# Patient Record
Sex: Male | Born: 1955 | Race: Black or African American | Hispanic: No | Marital: Married | Smoking: Former smoker
Health system: Southern US, Community
[De-identification: ages and names within clinical notes are randomized; demographics above are authoritative.]

## PROBLEM LIST (undated history)

## (undated) DIAGNOSIS — I340 Nonrheumatic mitral (valve) insufficiency: Secondary | ICD-10-CM

## (undated) DIAGNOSIS — I714 Abdominal aortic aneurysm, without rupture, unspecified: Secondary | ICD-10-CM

## (undated) DIAGNOSIS — I8393 Asymptomatic varicose veins of bilateral lower extremities: Secondary | ICD-10-CM

## (undated) DIAGNOSIS — N289 Disorder of kidney and ureter, unspecified: Secondary | ICD-10-CM

## (undated) DIAGNOSIS — Z5111 Encounter for antineoplastic chemotherapy: Secondary | ICD-10-CM

## (undated) DIAGNOSIS — C3432 Malignant neoplasm of lower lobe, left bronchus or lung: Secondary | ICD-10-CM

## (undated) DIAGNOSIS — I10 Essential (primary) hypertension: Secondary | ICD-10-CM

## (undated) DIAGNOSIS — C801 Malignant (primary) neoplasm, unspecified: Secondary | ICD-10-CM

## (undated) DIAGNOSIS — Z8619 Personal history of other infectious and parasitic diseases: Secondary | ICD-10-CM

## (undated) DIAGNOSIS — R918 Other nonspecific abnormal finding of lung field: Secondary | ICD-10-CM

## (undated) DIAGNOSIS — Z902 Acquired absence of lung [part of]: Secondary | ICD-10-CM

## (undated) DIAGNOSIS — M5126 Other intervertebral disc displacement, lumbar region: Secondary | ICD-10-CM

## (undated) DIAGNOSIS — C3490 Malignant neoplasm of unspecified part of unspecified bronchus or lung: Secondary | ICD-10-CM

## (undated) HISTORY — DX: Malignant neoplasm of unspecified part of unspecified bronchus or lung: C34.90

## (undated) HISTORY — DX: Malignant neoplasm of lower lobe, left bronchus or lung: C34.32

## (undated) HISTORY — PX: INGUINAL HERNIA REPAIR: SUR1180

## (undated) HISTORY — DX: Abdominal aortic aneurysm, without rupture, unspecified: I71.40

## (undated) HISTORY — DX: Other intervertebral disc displacement, lumbar region: M51.26

## (undated) HISTORY — DX: Abdominal aortic aneurysm, without rupture: I71.4

## (undated) HISTORY — DX: Disorder of kidney and ureter, unspecified: N28.9

## (undated) HISTORY — DX: Nonrheumatic mitral (valve) insufficiency: I34.0

## (undated) HISTORY — DX: Acquired absence of lung (part of): Z90.2

## (undated) HISTORY — DX: Encounter for antineoplastic chemotherapy: Z51.11

## (undated) HISTORY — PX: COLONOSCOPY: SHX174

---

## 1973-04-23 HISTORY — PX: INGUINAL HERNIA REPAIR: SUR1180

## 1999-12-15 ENCOUNTER — Encounter: Admission: RE | Admit: 1999-12-15 | Discharge: 1999-12-15 | Payer: Self-pay | Admitting: Cardiology

## 1999-12-15 ENCOUNTER — Encounter: Payer: Self-pay | Admitting: Cardiology

## 2000-06-10 ENCOUNTER — Encounter: Payer: Self-pay | Admitting: General Surgery

## 2000-06-10 ENCOUNTER — Encounter (INDEPENDENT_AMBULATORY_CARE_PROVIDER_SITE_OTHER): Payer: Self-pay

## 2000-06-10 ENCOUNTER — Inpatient Hospital Stay (HOSPITAL_COMMUNITY): Admission: EM | Admit: 2000-06-10 | Discharge: 2000-06-12 | Payer: Self-pay | Admitting: Emergency Medicine

## 2004-05-29 ENCOUNTER — Encounter: Admission: RE | Admit: 2004-05-29 | Discharge: 2004-05-29 | Payer: Self-pay | Admitting: Cardiology

## 2006-12-06 ENCOUNTER — Encounter: Admission: RE | Admit: 2006-12-06 | Discharge: 2006-12-06 | Payer: Self-pay | Admitting: Cardiology

## 2006-12-17 ENCOUNTER — Ambulatory Visit (HOSPITAL_COMMUNITY): Admission: RE | Admit: 2006-12-17 | Discharge: 2006-12-17 | Payer: Self-pay | Admitting: Cardiology

## 2010-09-08 NOTE — Op Note (Signed)
[facility]  Patient:    [name], [name]                        MRN: [id number] Proc. Date: 06/11/00 Adm. Date:  [id number] Attending:  Brandy Hale CC:         Osvaldo Shipper. Spruill, M.D.  Davis L. Cloward, M.D., [facility]   Operative Report  PREOPERATIVE DIAGNOSIS:  Incarcerated right inguinal hernia.  POSTOPERATIVE DIAGNOSIS:  Incarcerated right femoral hernia.  OPERATIVE PROCEDURE:  Repair of incarcerated right femoral hernia with mesh plug and patch.  SURGEON:  Angelia Mould. Derrell Lolling, M.D.  INDICATIONS:  This is a 55 year old white man with the onset of abdominal pain and repeated episodes of vomiting at 10:00 a.m. this morning. He has a painful lump in his right groin.  On examination, he has a nondistended, but mildly diffusely tender abdomen and a tender 3-4 cm mass in his right inguinal area. This is not reducible. X-rays show a couple of dilated loops of small bowel with air fluid levels, but no free air. His white blood cell count was elevated to 14,000. He was brought to the operating room emergently for exploration of the right groin and possible laparotomy.  INTRAOPERATIVE FINDINGS:  The patient had an incarcerated right femoral hernia. When the hernia was opened, it contained some very light serosanguineous fluid, no odor. We ran the small intestine and found an area where the small intestine was slightly thickened suggesting that it had been incarcerated but there was no evidence of ischemia or perforation. We therefore, did not feel that laparotomy was required.  OPERATIVE TECHNIQUE:  Following the induction of general endotracheal anesthesia, a nasogastric tube and Foley catheter were inserted. The abdomen and genitalia were prepped and draped in a sterile fashion.  An oblique incision was made in the right groin overlying the inguinal ligament. Dissection was carried down through the subcutaneous tissue. We identified  the pubic tubercle and the inguinal ligament and the fascia of the external oblique fascia and the external inguinal ring. The incarcerated hernia was a femoral hernia. We had to open up the external oblique fascia by dividing it in the direction of its fibers opening up the external inguinal ring. We had to divide the inguinal ligament over the neck of the femoral hernia. Once this was completely divided, we were able to free up the femoral hernia and open the hernia sac and we found that there was no intestine in the sac, with just a little bit of clear, slightly pink colored fluid without odor. There was no exudate. Using a Babcock clamp, we retrieved the small bowel and examined it for a great length, probably 10 or 12 feet. We found one area where the small bowel was slightly dilated and erythematous and we felt that this was the area that had been incarcerated most likely. We did not feel that laparotomy was indicated based on these findings. The femoral hernia sac was then closed carefully with a pursestring suture of 2-0 silk, being careful to avoid injury to the bladder and femoral vessels. We elected to repair this with a polypropylene plug and patch. We brought the medium conical plug and inserted this into the femoral space where it lay quite nicely. We then brought a piece of 3 inch x 6 inch piece of polypropylene mesh into the operative field and we cut it into an appropriate shape and sutured it in place with interrupted mattress  sutures of 2-0 Prolene. The mesh was sutured so as to generously overlap the fascia of the pubic tubercle. Several interrupted sutures were placed along Coopers ligament. A transition stitch was placed more laterally and we had a couple of stitches in the inguinal ligament lateral to the femoral vessels. Interrupted mattress sutures were also placed superiorly through the mesh into the internal oblique muscle. We examined the cord structures of the  internal ring and removed the cremasteric muscle fibers. There was no evidence of an indirect hernia. The tails of the mesh were created by incising the mesh laterally so that it would wrap around the cord structures of the internal ring. The tails of the mesh were then overlapped laterally and sutures completed laterally. This provided a very secure repair both medial and lateral to the internal ring but allowed an adequate opening for the cord structures and sensory nerves. This was essentially a Administrator, arts with mesh and a plug in the femoral space. Hemostasis was excellent. The wound was irrigated with antibiotics irrigating solution. The external oblique fascia was closed with a running suture of 2-0 Vicryl placing the cord structures and sensory nerves deep to the external oblique fascia. Scarpas fascia was closed with a running suture of 3-0 Vicryl. The skin was closed with skin staples and Steri-Strips. Clean bandages were placed and the patient taken to the recovery room in stable condition. Estimated blood loss was about 30 cc. There were no complications. Sponge, needle, lap and instrument counts were correct. DD:  06/11/00 TD:  06/11/00 Job: [id number]

## 2010-09-08 NOTE — H&P (Signed)
[facility]  Patient:    [name], [name]                        MRN: [id number] Adm. Date:  [id number] Attending:  Brandy Hale CC:         Osvaldo Shipper. Spruill, M.D.  Davis L. Cloward, M.D., [facility]   History and Physical  CHIEF COMPLAINT:  Abdominal pain, vomiting, right groin pain.  HISTORY OF PRESENT ILLNESS:  This is a 55 year old white man who has had a bulge in his right groin for at least five years.  At 10:30 a.m. this morning, he began developing periumbilical pain which was followed shortly thereafter by repeated episodes of vomiting; he also has a painful lump in his right groin which he cannot reduce.  His last bowel movement was early this morning. He has not had any diarrhea.  He denies fever or chills.  He went to [facility] this evening and was seen by Dr. Earlene Plater L. Cloward; he was felt to have a tender abdomen and an incarcerated right inguinal hernia.  CBC showed a WBC count of 15,000 at [facility].  I was called and we asked that the patient be transferred immediately to [facility] Emergency Room.  The patient is admitted for evaluation and management of what is thought to be an incarcerated and possibly strangulated right inguinal hernia.  PAST SURGICAL HISTORY:  Left inguinal hernia repair in 1975.  No other surgical problems.  PAST MEDICAL HISTORY:  Medical problems:  Denies.  CURRENT MEDICATIONS:  None.  DRUG ALLERGIES:  None known.  FAMILY HISTORY:  Mother living and has diabetes.  Father deceased, age 11; had an MI and coronary artery disease.  He has four sisters and four brothers living and well.  SOCIAL HISTORY:  The patient lives in [place].  He is married.  They have three children.  He works as a Arboriculturist at [employer] and he also has a second job as a Arboriculturist at one of [employer] here in town.  He smokes one and a half packs of cigarettes per day and drinks about  12 beers per week.  REVIEW OF SYSTEMS:  All systems are reviewed and are negative except as described above.  PHYSICAL EXAMINATION  GENERAL:  A thin black man in moderate distress.  He moves around in bed a lot.  He appears somewhat distracted by his pain.  VITAL SIGNS:  Blood pressure 134/88, heart rate 76, respiratory rate 26 and temperature 97.4.  HEENT:  Sclerae clear.  Extraocular movements intact.  Oropharynx reveals upper dentures and partial lower dental plate.  No lesions.  NECK:  Supple, nontender.  No mass.  No bruit.  No thyromegaly.  No adenopathy.  LUNGS:  Clear to auscultation.  HEART:  Regular rate and rhythm.  No murmur.  ABDOMEN:  Abdomen is not distended.  Bowel sounds are diminished.  He has diffuse tenderness and some guarding throughout.  No mass.  No focal abnormality of the umbilicus.  In the right groin, he has a 3- to 4-cm tender mass which I cannot reduce.  He does not have any cellulitis.  There is a well-healed scar in the left groin but there is no mass in the left groin and there is no tenderness there.  GENITALIA:  Normal penis, scrotum and testes.  Testes feel normal.  There is no scrotal mass.  EXTREMITIES:  No edema.  Good pulses.  NEUROLOGIC:  Grossly within normal limits.  IMPRESSION 1. Incarcerated and possibly strangulated right inguinal hernia. 2. Status post left inguinal hernia repair, no evidence of recurrence. 3. Tobacco abuse. 4. Alcohol use.  PLAN:  The patient will be admitted.  Lab work and abdominal x-rays will be obtained.  A nasogastric tube will be inserted.  I anticipate taking him to the operating room as soon as possible this evening.  I have discussed the indications and details or surgery with the patient and his wife.  They have been advised that he will require a right groin incision and possibly a laparotomy.  They are aware that he possibly will require an intestinal resection.  Risks and complications have  been outlined including, but not limited to, bleeding, infection, recurrence of the hernia, other wound problems, intestinal injury and other unforeseen problems.  They seem to understand these issues well.  At this time, all of their questions are answered.  They agree with this plan. DD:  06/10/00 TD:  06/11/00 Job: [id number]

## 2010-09-08 NOTE — Op Note (Signed)
[facility]  Patient:    [name], [name]                        MRN: [id number] Proc. Date: 06/11/00 Adm. Date:  [id number] Attending:  Brandy Hale CC:         Osvaldo Shipper. Spruill, M.D.  Gabriel Earing, M.D.   Operative Report  PREOPERATIVE DIAGNOSIS:  Incarcerated right inguinal hernia.  POSTOPERATIVE DIAGNOSIS:  Incarcerated right femoral hernia.  OPERATION PERFORMED:  Repair incarcerated right femoral hernia with mesh plug and patch.  SURGEON:  Angelia Mould. Derrell Lolling, M.D.  ANESTHESIA:  The patient is a 55 year old white male with the onset of abdominal pain and repeated episodes of vomiting at 10 a.m. this morning.  He has a painful lump in his right groin.  On examination, he has a nondistended but mildly diffusely tender abdomen and a tender 3 to 4 cm mass in his right inguinal area.  This was not reducible.  X-rays show a couple of dilated loops of small bowel with air-fluid level but no free air.  White blood cell count was elevated to 14,000.  He was brought to the operating room emergently for exploration of the right groin and possible laparotomy.  OPERATIVE FINDINGS:  The patient had an incarcerated right femoral hernia. When the hernia was opened, it contained some very light serosanguinous fluid, no odor.  We ran the small intestine and found an area where the small intestine was slightly thickened, suggesting that it had been incarcerated but there was no evidence of ischemia or perforation.  We therefore did not feel that laparotomy was required.  DESCRIPTION OF PROCEDURE:  Following induction of general endotracheal anesthesia, a nasogastric tube and Foley catheter were inserted.  The abdomen and genitalia were prepped and draped in a sterile fashion.  An oblique incision was made in the right groin overlying the inguinal ligament. Dissection was carried down through the subcutaneous tissues.  We identified the pubic tubercle  and the inguinal ligament and the fascia of the external oblique and the external inguinal ring.  The incarcerated hernia was a femoral hernia.  We had to open up the external oblique by dividing it in the direction of its fibers, opening up the external inguinal ring.  We had to divide the inguinal ligament over the neck of the femoral hernia.  Once this was completely divided, we were able to free up the femoral hernia and open the hernia sac and we found that there was no intestine in the sac.  Just a little bit of clear, slightly pink colored fluid without odor.  There was no exudate.  Using a Babcock clamp, we retrieved the small bowel and examined it for a great length, probably 10 or 12 feet.  We found one area where the small bowel was slightly dilated and erythematous and we felt that this was the area that had been incarcerated most likely.  We did not feel that laparotomy was indicated based on these findings.  The femoral hernia sac was then closed carefully with a pursestring suture of 2-0 silk being careful to avoid injury to the bladder and femoral vessels.  We elected to repair this with a polypropylene plug and patch.  We brought the medium conical plug and inserted this into the femoral space where it lay quite nicely.  We then brought a piece of 3 inch x 6 inch polypropylene mesh to the operative field and cut it  into an appropriate shape and sutured it in place with interrupted mattress sutures of 2-0 Prolene.  The mesh was sutured so as to generously overlap the fascia at the pubic tubercle.  Several interrupted sutures were placed along Coopers ligament.  The transition stitch was placed more laterally and we had a couple of stitches in the inguinal ligament lateral to the femoral vessels.  Interrupted mattress sutures were also placed superiorly through the mesh into the internal oblique muscle.  We examined the cord structures at the internal ring and removed  the cremasteric muscle fibers.  There was no evidence of an indirect hernia. The tails of the mesh were created by incising the mesh laterally so that it would wrap around the cord structures of the internal ring.  The tails of the mesh were then overlapped laterally and sutures completed laterally. This provided a very secure repair both medial and lateral to the internal ring but allowed an adequate opening for cord structures and sensory nerves.  This was essentially a Administrator, arts with mesh and a plug in the femoral space.  Hemostasis was excellent.  The wound was irrigated with antibiotic irrigating solution.  The external oblique was closed with running sutures of 2-0 Vicryl suture placing the cord structures and sensory nerves deep to the external oblique.  Scarpas fascia was closed with running suture of 3-0 Vicryl.  The skin was closed with skin staples and Steri-Strips.  Clean bandages were placed and the patient was taken to the recovery room in stable condition.  Estimated blood loss was about 30 cc.  Complications were none.  Sponge, needle and instrument counts were correct. DD:  06/11/00 TD:  06/11/00 Job: [id number]

## 2012-08-06 ENCOUNTER — Other Ambulatory Visit: Payer: Self-pay

## 2012-08-06 ENCOUNTER — Other Ambulatory Visit: Payer: Self-pay | Admitting: Cardiology

## 2012-08-06 DIAGNOSIS — R29898 Other symptoms and signs involving the musculoskeletal system: Secondary | ICD-10-CM

## 2012-08-06 DIAGNOSIS — M545 Low back pain, unspecified: Secondary | ICD-10-CM

## 2012-09-02 ENCOUNTER — Other Ambulatory Visit: Payer: Self-pay | Admitting: Neurosurgery

## 2012-09-02 DIAGNOSIS — IMO0002 Reserved for concepts with insufficient information to code with codable children: Secondary | ICD-10-CM

## 2012-09-02 DIAGNOSIS — M545 Low back pain, unspecified: Secondary | ICD-10-CM

## 2012-09-06 ENCOUNTER — Ambulatory Visit
Admission: RE | Admit: 2012-09-06 | Discharge: 2012-09-06 | Disposition: A | Discharge status: Home / Self Care | Payer: BC Managed Care – PPO | Source: Ambulatory Visit | Attending: Neurosurgery | Admitting: Neurosurgery

## 2012-09-06 DIAGNOSIS — M545 Low back pain, unspecified: Secondary | ICD-10-CM

## 2012-09-06 DIAGNOSIS — IMO0002 Reserved for concepts with insufficient information to code with codable children: Secondary | ICD-10-CM

## 2012-10-22 HISTORY — PX: LUMBAR LAMINECTOMY: SHX95

## 2012-11-12 ENCOUNTER — Encounter: Payer: Self-pay | Admitting: Vascular Surgery

## 2012-11-12 ENCOUNTER — Other Ambulatory Visit: Payer: Self-pay | Admitting: *Deleted

## 2012-11-13 ENCOUNTER — Encounter: Payer: Self-pay | Admitting: Vascular Surgery

## 2012-11-14 ENCOUNTER — Encounter: Payer: BC Managed Care – PPO | Admitting: Vascular Surgery

## 2012-12-25 ENCOUNTER — Encounter: Payer: Self-pay | Admitting: Vascular Surgery

## 2012-12-26 ENCOUNTER — Ambulatory Visit (INDEPENDENT_AMBULATORY_CARE_PROVIDER_SITE_OTHER): Payer: BC Managed Care – PPO | Admitting: Vascular Surgery

## 2012-12-26 ENCOUNTER — Encounter: Payer: Self-pay | Admitting: Vascular Surgery

## 2012-12-26 VITALS — BP 124/73 | HR 61 | Ht 76.0 in | Wt 155.0 lb

## 2012-12-26 DIAGNOSIS — I714 Abdominal aortic aneurysm, without rupture, unspecified: Secondary | ICD-10-CM

## 2012-12-26 NOTE — Progress Notes (Signed)
[facility] [place]  Referred by: Pola Corn, MD [address] [place], [place] [address]  Reason for referral: AAA  History of Present Illness  The patient is a 57 y.o. (September 29, 1955) male who presents with chief complaint: aneurysm.  Patient had a HNP and incidentally on lumbar MRI for that condition, an 4.8 cm AAA was discovered.  The patient does have history of left back pain without abdominal pain.  The patient does not have history of embolic episodes from the AAA.  The patient's risk factors for AAA included: male, age and smoking.  The patient does smoke cigarettes.  There is no familial history of aneurysmal disease.  Past Medical History  Diagnosis Date  . AAA (abdominal aortic aneurysm)   . HNP (herniated nucleus pulposus), lumbar     L4 with radiculopathy    Past Surgical History  Procedure Laterality Date  . Inguinal hernia repair Left 1975    Left inguinal hernia  . Lumbar laminectomy      History   Social History  . Marital Status: Married    Spouse Name: N/A    Number of Children: N/A  . Years of Education: N/A   Occupational History  . Not on file.   Social History Main Topics  . Smoking status: Current Every Day Smoker -- 1.00 packs/day    Types: Cigarettes  . Smokeless tobacco: Never Used  . Alcohol Use: 7.2 oz/week    12 Cans of beer per week  . 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 Sexual Activity: Not on file   Other Topics Concern  . Not on file   Social History Narrative  . No narrative on file    Family History  Problem Relation Age of Onset  . Diabetes Mother     No current outpatient prescriptions on file prior to visit.   No current facility-administered medications on file prior to visit.    No Known Allergies  REVIEW OF SYSTEMS:  (Positives checked otherwise negative)  CARDIOVASCULAR:  []  chest pain, []  chest pressure, []  palpitations, []   shortness of breath when laying flat, []  shortness of breath with exertion,  [x]  pain in feet when walking, []  pain in feet when laying flat, []  history of blood clot in veins (DVT), []  history of phlebitis, []  swelling in legs, []  varicose veins  PULMONARY:  []  productive cough, []  asthma, []  wheezing  NEUROLOGIC:  []  weakness in arms or legs, []  numbness in arms or legs, []  difficulty speaking or slurred speech, []  temporary loss of vision in one eye, []  dizziness  HEMATOLOGIC:  []  bleeding problems, []  problems with blood clotting too easily  MUSCULOSKEL:  []  joint pain, []  joint swelling  GASTROINTEST:  []  vomiting blood, []  blood in stool     GENITOURINARY:  []  burning with urination, []  blood in urine  PSYCHIATRIC:  []  history of major depression  INTEGUMENTARY:  []  rashes, []  ulcers  CONSTITUTIONAL:  []  fever, []  chills   Physical Examination  Filed Vitals:   12/26/12 1045  BP: 124/73  Pulse: 61  Height: 6\' 4"  (1.93 m)  Weight: 155 lb (70.308 kg)  SpO2: 100%   Body mass index is 18.88 kg/(m^2).  General: A&O x 3, WD, thin  Head: [place]/AT  Ear/Nose/Throat: Hearing grossly intact, nares w/o erythema or drainage, oropharynx w/o Erythema/Exudate  Eyes: PERRLA, EOMI  Neck: Supple, no nuchal  rigidity, no palpable LAD  Pulmonary: Sym exp, good air movt, CTAB, no rales, rhonchi, & wheezing  Cardiac: RRR, Nl S1, S2, no Murmurs, rubs or gallops  Vascular: Vessel Right Left  Radial Palpable Palpable  Brachial Palpable Palpable  Carotid Palpable, without bruit Palpable, without bruit  Aorta Not palpable N/A  Femoral Palpable Palpable  Popliteal Not palpable Not palpable  PT Palpable Palpable  DP Palpable Palpable   Gastrointestinal: soft, NTND, -G/R, - HSM, - masses, - CVAT B, good muscle tone, cannot palpate AAA due to muscle tone  Musculoskeletal: M/S 5/5 throughout , Extremities without ischemic changes   Neurologic: CN 2-12 intact , Pain and light touch  intact in extremities , Motor exam as listed above  Psychiatric: Judgment intact, Mood & affect appropriate for pt's clinical situation  Dermatologic: See M/S exam for extremity exam, no rashes otherwise noted  Lymph : No Cervical, Axillary, or Inguinal lymphadenopathy   Non-contrast MRI L-spine (09/06/12)  1. Soft disc extrusion at L4-5 into the left lateral recess and left neural foramen compressing the left L4 nerve. Annular tear and disc bulge in to the left lateral recess could affect the left L5 nerve.  2. 4.8 cm saccular abdominal aortic aneurysm.   Based on my review of the L-spine MRI, there is an inadequately imaged AAA in this patient.  Additional imaging will be needed.  3 pages of outside clinic charts were reviewed including a L-spine MRI report.  Medical Decision Making  The patient is a 57 y.o. male who presents with: inadequately imaged AAA.   Based on this patient's exam and diagnostic studies, he needs aortic duplex to measure the entire AAA to get maximal sizing data.  The threshold for repair is AAA size > 5.5 cm, growth > 1 cm/yr, and symptomatic status.  The patient will follow up in 2 weeks with the above study.  I emphasized the importance of maximal medical management including strict control of blood pressure, blood glucose, and lipid levels, antiplatelet agents, obtaining regular exercise, and cessation of smoking.    Thank you for allowing [place] to participate in this patient's care.  Leonides Sake, MD [facility] [place] Office: [phone] Pager: [phone]  12/26/2012, 11:08 AM

## 2012-12-26 NOTE — Addendum Note (Signed)
Addended by: Adria Dill L on: 12/26/2012 11:48 AM   Modules accepted: Orders

## 2013-01-08 ENCOUNTER — Encounter: Payer: Self-pay | Admitting: Vascular Surgery

## 2013-01-09 ENCOUNTER — Encounter: Payer: Self-pay | Admitting: Vascular Surgery

## 2013-01-09 ENCOUNTER — Ambulatory Visit (INDEPENDENT_AMBULATORY_CARE_PROVIDER_SITE_OTHER): Payer: BC Managed Care – PPO | Admitting: Vascular Surgery

## 2013-01-09 ENCOUNTER — Encounter (INDEPENDENT_AMBULATORY_CARE_PROVIDER_SITE_OTHER): Payer: BC Managed Care – PPO | Admitting: Vascular Surgery

## 2013-01-09 VITALS — BP 140/84 | HR 62 | Resp 16 | Ht 76.0 in | Wt 153.0 lb

## 2013-01-09 DIAGNOSIS — I714 Abdominal aortic aneurysm, without rupture: Secondary | ICD-10-CM

## 2013-01-09 NOTE — Addendum Note (Signed)
Addended by: Adria Dill L on: 01/09/2013 12:07 PM   Modules accepted: Orders

## 2013-01-09 NOTE — Progress Notes (Signed)
[facility] [place]  Established Abdominal Aortic Aneurysm  History of Present Illness  The patient is a 57 y.o. (04/08/1956) male who presents with chief complaint: follow up for AAA.  Previously he had a Lumbar MRI which suggested an AAA.  The patient does not have back or abdominal pain.  The patient is not longer a smoker, having recently quit..  The patient's PMH, PSH, SH, FamHx, Med, and Allergies are unchanged from 12/26/12.  On ROS today: no embolic sx, no back or abd pain  Physical Examination  Filed Vitals:   01/09/13 1048  BP: 140/84  Pulse: 62  Resp: 16  Height: 6\' 4"  (1.93 m)  Weight: 153 lb (69.4 kg)  SpO2: 99%   Body mass index is 18.63 kg/(m^2).  General: A&O x 3, WD, thin   Pulmonary: Sym exp, good air movt, CTAB, no rales, rhonchi, & wheezing   Cardiac: RRR, Nl S1, S2, no Murmurs, rubs or gallops   Vascular:  Vessel  Right  Left   Radial  Palpable  Palpable   Brachial  Palpable  Palpable   Carotid  Palpable, without bruit  Palpable, without bruit   Aorta  Not palpable  N/A   Femoral  Palpable  Palpable   Popliteal  Not palpable  Not palpable   PT  Palpable  Palpable   DP  Palpable  Palpable    Gastrointestinal: soft, NTND, -G/R, - HSM, - masses, - CVAT B, good muscle tone, cannot palpate AAA due to muscle tone   Musculoskeletal: M/S 5/5 throughout , Extremities without ischemic changes   Neurologic: Pain and light touch intact in extremities , Motor exam as listed above   Non-Invasive Vascular Imaging  AAA Duplex (01/09/2013)  Current size:  4.0 cm x 5.1 cm   Medical Decision Making  The patient is a 57 y.o. male who presents with: asymptomatic AAA    Based on this patient's exam and diagnostic studies, he needs q6 month AAA duplex.  AAA grow ~0.3 cm/yr on average so I suspect he will need repair within the next 6-12 months..  The threshold for repair is AAA size > 5.5 cm, growth > 1 cm/yr, and symptomatic status.  I  emphasized the importance of maximal medical management including strict control of blood pressure, blood glucose, and lipid levels, antiplatelet agents, obtaining regular exercise, and cessation of smoking.    Thank you for allowing [place] to participate in this patient's care.  Leonides Sake, MD [facility] [place] Office: [phone] Pager: [phone]  01/09/2013, 11:29 AM

## 2013-07-09 ENCOUNTER — Encounter: Payer: Self-pay | Admitting: Vascular Surgery

## 2013-07-10 ENCOUNTER — Encounter: Payer: Self-pay | Admitting: Vascular Surgery

## 2013-07-10 ENCOUNTER — Ambulatory Visit (INDEPENDENT_AMBULATORY_CARE_PROVIDER_SITE_OTHER): Payer: BC Managed Care – PPO | Admitting: Vascular Surgery

## 2013-07-10 ENCOUNTER — Ambulatory Visit (HOSPITAL_COMMUNITY)
Admission: RE | Admit: 2013-07-10 | Discharge: 2013-07-10 | Disposition: A | Discharge status: Home / Self Care | Payer: BC Managed Care – PPO | Source: Ambulatory Visit | Attending: Vascular Surgery | Admitting: Vascular Surgery

## 2013-07-10 VITALS — BP 142/78 | HR 55 | Ht 76.0 in | Wt 158.0 lb

## 2013-07-10 DIAGNOSIS — I714 Abdominal aortic aneurysm, without rupture, unspecified: Secondary | ICD-10-CM | POA: Insufficient documentation

## 2013-07-10 NOTE — Progress Notes (Signed)
    Established Abdominal Aortic Aneurysm  History of Present Illness  The patient is a 58 y.o. (11/25/1955) male who presents with chief complaint: follow up for AAA.  Previous studies demonstrate an AAA, measuring 5.1 cm.  The patient does not have back or abdominal pain.  The patient is a smoker.  He denies any history consistent with embolic phenomena.  The patient's PMH, PSH, SH, FamHx, Med, and Allergies are unchanged from 01/09/13.  On ROS today: no abd or back pain, the patient denies any intermittent claudication  Physical Examination  Filed Vitals:   07/10/13 1055  BP: 142/78  Pulse: 55  Height: 6\' 4"  (1.93 m)  Weight: 158 lb (71.668 kg)  SpO2: 100%   Body mass index is 19.24 kg/(m^2).  General: A&O x 3, WD, thin, tall  Pulmonary: Sym exp, good air movt, CTAB, no rales, rhonchi, & wheezing  Cardiac: RRR, Nl S1, S2, no Murmurs, rubs or gallops  Vascular: Vessel Right Left  Radial Palpable Palpable  Brachial Palpable Palpable  Carotid Palpable, without bruit Palpable, without bruit  Aorta Not palpable N/A  Femoral Palpable Palpable  Popliteal Not palpable Not palpable  PT Palpable Palpable  DP Palpable Palpable   Gastrointestinal: soft, NTND, -G/R, - HSM, - masses, - CVAT B, cannot palpate aorta due to good abd muscle tone  Musculoskeletal: M/S 5/5 throughout , Extremities without ischemic changes   Neurologic: Pain and light touch intact in extremities , Motor exam as listed above  Non-Invasive Vascular Imaging  AAA Duplex (07/10/2013)  Previous size: 5.1 cm (Date: 01/09/13)  Current size:  4.9cm x 4.7 cm (Date: 07/10/13)  Medical Decision Making  The patient is a 58 y.o. male who presents with: asymptomatic AAA with stable size.   Based on this patient's exam and diagnostic studies, he needs q6 month AAA duplex.  The threshold for repair is AAA size > 5.5 cm, growth > 1 cm/yr, and symptomatic status.  Given usual growth of 0.3 cm/year, I expect  this patient will need repair within the next 3 years.  I emphasized the importance of maximal medical management including strict control of blood pressure, blood glucose, and lipid levels, antiplatelet agents, obtaining regular exercise, and cessation of smoking.    Thank you for allowing [place] to participate in this patient's care.  Adele Barthel, MD [facility] Office: [phone] Pager: [phone]  07/10/2013, 11:17 AM

## 2013-07-10 NOTE — Addendum Note (Signed)
Addended by: Dorthula Rue L on: 07/10/2013 06:56 PM   Modules accepted: Orders

## 2014-01-14 ENCOUNTER — Encounter: Payer: Self-pay | Admitting: Family

## 2014-01-15 ENCOUNTER — Ambulatory Visit: Payer: BC Managed Care – PPO | Admitting: Family

## 2014-01-15 ENCOUNTER — Other Ambulatory Visit (HOSPITAL_COMMUNITY): Payer: BC Managed Care – PPO

## 2014-02-03 ENCOUNTER — Encounter: Payer: Self-pay | Admitting: Family

## 2014-02-04 ENCOUNTER — Ambulatory Visit (HOSPITAL_COMMUNITY)
Admission: RE | Admit: 2014-02-04 | Discharge: 2014-02-04 | Disposition: A | Discharge status: Home / Self Care | Payer: BC Managed Care – PPO | Source: Ambulatory Visit | Attending: Family | Admitting: Family

## 2014-02-04 ENCOUNTER — Ambulatory Visit (INDEPENDENT_AMBULATORY_CARE_PROVIDER_SITE_OTHER): Payer: BC Managed Care – PPO | Admitting: Family

## 2014-02-04 ENCOUNTER — Encounter: Payer: Self-pay | Admitting: Family

## 2014-02-04 VITALS — BP 133/85 | HR 67 | Resp 16 | Ht 76.0 in | Wt 149.0 lb

## 2014-02-04 DIAGNOSIS — I714 Abdominal aortic aneurysm, without rupture, unspecified: Secondary | ICD-10-CM

## 2014-02-04 HISTORY — DX: Abdominal aortic aneurysm, without rupture: I71.4

## 2014-02-04 HISTORY — DX: Abdominal aortic aneurysm, without rupture, unspecified: I71.40

## 2014-02-04 NOTE — Addendum Note (Signed)
Addended by: Mena Goes on: 02/04/2014 03:38 PM   Modules accepted: Orders

## 2014-02-04 NOTE — Progress Notes (Signed)
[facility]  Established Abdominal Aortic Aneurysm  History of Present Illness  [name] is a 58 y.o. (1956-03-03) male patient of Dr. Bridgett Larsson who presents with chief complaint: follow up for AAA. Previous studies demonstrate an AAA, measuring 5.1 cm. The patient does not have back or abdominal pain. The patient is a smoker. He denies any history consistent with embolic phenomena.  The patient deines a history of stroke or TIA symptoms. His job entails a great deal of walking, he denies claudication symptoms with walking, denies non healing wounds. He takes no medications, he had lumbar spine surgery in 2014 for left leg radiculopathy symptoms and foot drop which have resolved since surgery.  Pt Diabetic: No Pt smoker: smoker  (1/2 ppd, started at age 12 yrs) He admits to drinking 15 beers/week.  Past Medical History  Diagnosis Date  . AAA (abdominal aortic aneurysm)   . HNP (herniated nucleus pulposus), lumbar     L4 with radiculopathy   Past Surgical History  Procedure Laterality Date  . Inguinal hernia repair Left 1975    Left inguinal hernia  . Lumbar laminectomy  October 22, 2012   Social History History   Social History  . Marital Status: Married    Spouse Name: N/A    Number of Children: N/A  . Years of Education: N/A   Occupational History  . Not on file.   Social History Main Topics  . Smoking status: Current Every Day Smoker -- 1.00 packs/day    Types: Cigarettes  . Smokeless tobacco: Never Used  . Alcohol Use: 7.2 oz/week    12 Cans of beer per week  . 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 Sexual Activity: Not on file   Other Topics Concern  . Not on file   Social History Narrative  . No narrative on file   Family History Family History  Problem Relation Age of Onset  . Diabetes Mother     Current Outpatient Prescriptions on File Prior to Visit  Medication Sig Dispense  Refill  . ibuprofen (ADVIL,MOTRIN) 200 MG tablet Take 200 mg by mouth every 6 (six) hours as needed for pain.      . methylPREDNIsolone (MEDROL DOSPACK) 4 MG tablet       . traMADol (ULTRAM) 50 MG tablet        No current facility-administered medications on file prior to visit.   Allergies  Allergen Reactions  . Tramadol Nausea And Vomiting    ROS: See HPI for pertinent positives and negatives.  Physical Examination  Filed Vitals:   02/04/14 0915  BP: 133/85  Pulse: 67  Resp: 16  Height: 6\' 4"  (1.93 m)  Weight: 149 lb (67.586 kg)  SpO2: 99%   Body mass index is 18.14 kg/(m^2).  General: A&O x 3, WD, thin, tall  Pulmonary: Sym exp, good air movt, CTAB, no rales, rhonchi, & wheezing  Cardiac: RRR, Nl S1, S2, no Murmur detected  Vascular:  Vessel  Right  Left   Radial  2+Palpable  2+Palpable   Carotid  Palpable, without bruit  Palpable, without bruit   Aorta   palpable  N/A   Femoral  2+Palpable  2+Palpable   Popliteal  Not palpable  Not palpable   PT  2+Palpable  Not Palpable   DP  2+Palpable  2+Palpable   Gastrointestinal: soft, NTND, -G/R, - HSM, - masses palpated, -  CVAT B  Musculoskeletal: M/S 5/5 throughout , Extremities without ischemic changes  Neurologic: Pain and light touch intact in extremities , Motor exam as listed above   Non-Invasive Vascular Imaging  AAA Duplex (02/04/2014) ABDOMINAL AORTA DUPLEX EVALUATION    INDICATION: Evaluation of abdominal aorta.    PREVIOUS INTERVENTION(S):     DUPLEX EXAM:     LOCATION DIAMETER AP (cm) DIAMETER TRANSVERSE (cm) VELOCITIES (cm/sec)  Aorta Proximal 2.36 2.44 63  Aorta Mid 2.52 2.61 48  Aorta Distal 4.58 4.70 22  Right Common Iliac Artery 1.02 1.07   Left Common Iliac Artery Not Visualized       Previous max aortic diameter:  4.7 x 4.9 Date: 07/10/2013  ADDITIONAL FINDINGS:     IMPRESSION: Patent abdominal aortic aneurysm measuring approximately 4.58 x 4.70 cm in diameter.  Limited visualization of  the bilateral common iliac arteries due to overlying bowel gas.    Compared to the previous exam:  No significant change in comparison to the last exam on 07/10/2013.     Medical Decision Making  The patient is a 58 y.o. male who presents with asymptomatic AAA with no increase in size.  The patient was counseled re smoking cessation.   Based on this patient's exam and diagnostic studies, the patient will follow up in 6 months  with the following studies: AAA Duplex.  Consideration for repair of AAA would be made when the size is 5.5 cm, growth > 1 cm/yr, and symptomatic status.  I emphasized the importance of maximal medical management including strict control of blood pressure, blood glucose, and lipid levels, antiplatelet agents, obtaining regular exercise, and cessation of smoking.   The patient was given information about AAA including signs, symptoms, treatment, and how to minimize the risk of enlargement and rupture of aneurysms.    The patient was advised to call [phone] should the patient experience sudden onset abdominal or back pain.   Thank you for allowing [place] to participate in this patient's care.  Clemon Chambers, RN, MSN, FNP-C [facility] Office: [phone]  Clinic Physician: Oneida Alar  02/04/2014, 9:18 AM

## 2014-02-04 NOTE — Patient Instructions (Signed)
Abdominal Aortic Aneurysm An aneurysm is a weakened or damaged part of an artery wall that bulges from the normal force of blood pumping through the body. An abdominal aortic aneurysm is an aneurysm that occurs in the lower part of the aorta, the main artery of the body.  The major concern with an abdominal aortic aneurysm is that it can enlarge and burst (rupture) or blood can flow between the layers of the wall of the aorta through a tear (aorticdissection). Both of these conditions can cause bleeding inside the body and can be life threatening unless diagnosed and treated promptly. CAUSES  The exact cause of an abdominal aortic aneurysm is unknown. Some contributing factors are:   A hardening of the arteries caused by the buildup of fat and other substances in the lining of a blood vessel (arteriosclerosis).  Inflammation of the walls of an artery (arteritis).   Connective tissue diseases, such as Marfan syndrome.   Abdominal trauma.   An infection, such as syphilis or staphylococcus, in the wall of the aorta (infectious aortitis) caused by bacteria. RISK FACTORS  Risk factors that contribute to an abdominal aortic aneurysm may include:  Age older than 60 years.   High blood pressure (hypertension).  Male gender.  Ethnicity (white race).  Obesity.  Family history of aneurysm (first degree relatives only).  Tobacco use. PREVENTION  The following healthy lifestyle habits may help decrease your risk of abdominal aortic aneurysm:  Quitting smoking. Smoking can raise your blood pressure and cause arteriosclerosis.  Limiting or avoiding alcohol.  Keeping your blood pressure, blood sugar level, and cholesterol levels within normal limits.  Decreasing your salt intake. In somepeople, too much salt can raise blood pressure and increase your risk of abdominal aortic aneurysm.  Eating a diet low in saturated fats and cholesterol.  Increasing your fiber intake by including  whole grains, vegetables, and fruits in your diet. Eating these foods may help lower blood pressure.  Maintaining a healthy weight.  Staying physically active and exercising regularly. SYMPTOMS  The symptoms of abdominal aortic aneurysm may vary depending on the size and rate of growth of the aneurysm.Most grow slowly and do not have any symptoms. When symptoms do occur, they may include:  Pain (abdomen, side, lower back, or groin). The pain may vary in intensity. A sudden onset of severe pain may indicate that the aneurysm has ruptured.  Feeling full after eating only small amounts of food.  Nausea or vomiting or both.  Feeling a pulsating lump in the abdomen.  Feeling faint or passing out. DIAGNOSIS  Since most unruptured abdominal aortic aneurysms have no symptoms, they are often discovered during diagnostic exams for other conditions. An aneurysm may be found during the following procedures:  Ultrasonography (A one-time screening for abdominal aortic aneurysm by ultrasonography is also recommended for all men aged 65-75 years who have ever smoked).  X-ray exams.  A computed tomography (CT).  Magnetic resonance imaging (MRI).  Angiography or arteriography. TREATMENT  Treatment of an abdominal aortic aneurysm depends on the size of your aneurysm, your age, and risk factors for rupture. Medication to control blood pressure and pain may be used to manage aneurysms smaller than 6 cm. Regular monitoring for enlargement may be recommended by your caregiver if:  The aneurysm is 3-4 cm in size (an annual ultrasonography may be recommended).  The aneurysm is 4-4.5 cm in size (an ultrasonography every 6 months may be recommended).  The aneurysm is larger than 4.5 cm in   size (your caregiver may ask that you be examined by a vascular surgeon). If your aneurysm is larger than 6 cm, surgical repair may be recommended. There are two main methods for repair of an aneurysm:   Endovascular  repair (a minimally invasive surgery). This is done most often.  Open repair. This method is used if an endovascular repair is not possible. Document Released: 01/17/2005 Document Revised: 08/04/2012 Document Reviewed: 05/09/2012 [facility] Patient Information 2015 [place], [place]. This information is not intended to replace advice given to you by your health care provider. Make sure you discuss any questions you have with your health care provider.   Smoking Cessation Quitting smoking is important to your health and has many advantages. However, it is not always easy to quit since nicotine is a very addictive drug. Oftentimes, people try 3 times or more before being able to quit. This document explains the best ways for you to prepare to quit smoking. Quitting takes hard work and a lot of effort, but you can do it. ADVANTAGES OF QUITTING SMOKING  You will live longer, feel better, and live better.  Your body will feel the impact of quitting smoking almost immediately.  Within 20 minutes, blood pressure decreases. Your pulse returns to its normal level.  After 8 hours, carbon monoxide levels in the blood return to normal. Your oxygen level increases.  After 24 hours, the chance of having a heart attack starts to decrease. Your breath, hair, and body stop smelling like smoke.  After 48 hours, damaged nerve endings begin to recover. Your sense of taste and smell improve.  After 72 hours, the body is virtually free of nicotine. Your bronchial tubes relax and breathing becomes easier.  After 2 to 12 weeks, lungs can hold more air. Exercise becomes easier and circulation improves.  The risk of having a heart attack, stroke, cancer, or lung disease is greatly reduced.  After 1 year, the risk of coronary heart disease is cut in half.  After 5 years, the risk of stroke falls to the same as a nonsmoker.  After 10 years, the risk of lung cancer is cut in half and the risk of other cancers  decreases significantly.  After 15 years, the risk of coronary heart disease drops, usually to the level of a nonsmoker.  If you are pregnant, quitting smoking will improve your chances of having a healthy baby.  The people you live with, especially any children, will be healthier.  You will have extra money to spend on things other than cigarettes. QUESTIONS TO THINK ABOUT BEFORE ATTEMPTING TO QUIT You may want to talk about your answers with your health care provider.  Why do you want to quit?  If you tried to quit in the past, what helped and what did not?  What will be the most difficult situations for you after you quit? How will you plan to handle them?  Who can help you through the tough times? Your family? Friends? A health care provider?  What pleasures do you get from smoking? What ways can you still get pleasure if you quit? Here are some questions to ask your health care provider:  How can you help me to be successful at quitting?  What medicine do you think would be best for me and how should I take it?  What should I do if I need more help?  What is smoking withdrawal like? How can I get information on withdrawal? GET READY  Set a quit  date.  Change your environment by getting rid of all cigarettes, ashtrays, matches, and lighters in your home, car, or work. Do not let people smoke in your home.  Review your past attempts to quit. Think about what worked and what did not. GET SUPPORT AND ENCOURAGEMENT You have a better chance of being successful if you have help. You can get support in many ways.  Tell your family, friends, and coworkers that you are going to quit and need their support. Ask them not to smoke around you.  Get individual, group, or telephone counseling and support. Programs are available at [employer] and health centers. Call your local health department for information about programs in your area.  Spiritual beliefs and practices may  help some smokers quit.  Download a "quit meter" on your computer to keep track of quit statistics, such as how long you have gone without smoking, cigarettes not smoked, and money saved.  Get a self-help book about quitting smoking and staying off tobacco. [place] yourself from urges to smoke. Talk to someone, go for a walk, or occupy your time with a task.  Change your normal routine. Take a different route to work. Drink tea instead of coffee. Eat breakfast in a different place.  Reduce your stress. Take a hot bath, exercise, or read a book.  Plan something enjoyable to do every day. Reward yourself for not smoking.  Explore interactive web-based programs that specialize in helping you quit. GET MEDICINE AND USE IT CORRECTLY Medicines can help you stop smoking and decrease the urge to smoke. Combining medicine with the above behavioral methods and support can greatly increase your chances of successfully quitting smoking.  Nicotine replacement therapy helps deliver nicotine to your body without the negative effects and risks of smoking. Nicotine replacement therapy includes nicotine gum, lozenges, inhalers, nasal sprays, and skin patches. Some may be available over-the-counter and others require a prescription.  Antidepressant medicine helps people abstain from smoking, but how this works is unknown. This medicine is available by prescription.  Nicotinic receptor partial agonist medicine simulates the effect of nicotine in your brain. This medicine is available by prescription. Ask your health care provider for advice about which medicines to use and how to use them based on your health history. Your health care provider will tell you what side effects to look out for if you choose to be on a medicine or therapy. Carefully read the information on the package. Do not use any other product containing nicotine while using a nicotine replacement product.    RELAPSE OR DIFFICULT SITUATIONS Most relapses occur within the first 3 months after quitting. Do not be discouraged if you start smoking again. Remember, most people try several times before finally quitting. You may have symptoms of withdrawal because your body is used to nicotine. You may crave cigarettes, be irritable, feel very hungry, cough often, get headaches, or have difficulty concentrating. The withdrawal symptoms are only temporary. They are strongest when you first quit, but they will go away within 10-14 days. To reduce the chances of relapse, try to:  Avoid drinking alcohol. Drinking lowers your chances of successfully quitting.  Reduce the amount of caffeine you consume. Once you quit smoking, the amount of caffeine in your body increases and can give you symptoms, such as a rapid heartbeat, sweating, and anxiety.  Avoid smokers because they can make you want to smoke.  Do not let weight gain distract you. Many  smokers will gain weight when they quit, usually less than 10 pounds. Eat a healthy diet and stay active. You can always lose the weight gained after you quit.  Find ways to improve your mood other than smoking. FOR MORE INFORMATION  www.smokefree.gov  Document Released: 04/03/2001 Document Revised: 08/24/2013 Document Reviewed: 07/19/2011 [facility] Patient Information 2015 [place], [place]. This information is not intended to replace advice given to you by your health care provider. Make sure you discuss any questions you have with your health care provider.   How Much is Too Much Alcohol? Drinking too much alcohol can cause injury, accidents, and health problems. These types of problems can include:   Car crashes.  Falls.  Family fighting (domestic violence).  Drowning.  Fights.  Injuries.  Burns.  Damage to certain organs.  Having a baby with birth defects. ONE DRINK CAN BE TOO MUCH WHEN YOU ARE:  Working.  Pregnant or breastfeeding.  Taking  medicines. Ask your doctor.  Driving or planning to drive. WHAT IS A STANDARD DRINK?   1 regular beer (12 ounces or 360 milliliters).  1 glass of wine (5 ounces or 150 milliliters).  1 shot of liquor (1.5 ounces or 45 milliliters). BLOOD ALCOHOL LEVELS   .00 A person is sober.  Marland Kitchen03 A person has no trouble keeping balance, talking, or seeing right, but a "buzz" may be felt.  Marland Kitchen05 A person feels "buzzed" and relaxed.  Marland Kitchen08 or .10  A person is drunk. He or she has trouble talking, seeing right, and keeping his or her balance.  .15 A person loses body control and may pass out (blackout).  .20 A person has trouble walking (staggering) and throws up (vomits).  .30 A person will pass out (unconscious).  .40+ A person will be in a coma. Death is possible. If you or someone you know has a drinking problem, get help from a doctor.  Document Released: 02/03/2009 Document Revised: 07/02/2011 Document Reviewed: 02/03/2009 [facility] Patient Information 2015 [place], [place]. This information is not intended to replace advice given to you by your health care provider. Make sure you discuss any questions you have with your health care provider.

## 2014-08-05 ENCOUNTER — Encounter: Payer: Self-pay | Admitting: Family

## 2014-08-06 ENCOUNTER — Ambulatory Visit: Payer: BC Managed Care – PPO | Admitting: Family

## 2014-08-06 ENCOUNTER — Inpatient Hospital Stay (HOSPITAL_COMMUNITY): Admission: RE | Admit: 2014-08-06 | Payer: BC Managed Care – PPO | Source: Ambulatory Visit

## 2015-08-08 DIAGNOSIS — M7022 Olecranon bursitis, left elbow: Secondary | ICD-10-CM | POA: Diagnosis not present

## 2015-08-08 DIAGNOSIS — M5127 Other intervertebral disc displacement, lumbosacral region: Secondary | ICD-10-CM | POA: Diagnosis not present

## 2016-02-07 DIAGNOSIS — I714 Abdominal aortic aneurysm, without rupture: Secondary | ICD-10-CM | POA: Diagnosis not present

## 2016-02-07 DIAGNOSIS — M5127 Other intervertebral disc displacement, lumbosacral region: Secondary | ICD-10-CM | POA: Diagnosis not present

## 2016-02-08 ENCOUNTER — Other Ambulatory Visit: Payer: Self-pay | Admitting: Cardiology

## 2016-02-08 DIAGNOSIS — I714 Abdominal aortic aneurysm, without rupture, unspecified: Secondary | ICD-10-CM

## 2016-02-13 ENCOUNTER — Other Ambulatory Visit: Payer: Self-pay | Admitting: Cardiology

## 2016-02-13 DIAGNOSIS — R05 Cough: Secondary | ICD-10-CM

## 2016-02-13 DIAGNOSIS — R634 Abnormal weight loss: Secondary | ICD-10-CM

## 2016-02-13 DIAGNOSIS — R059 Cough, unspecified: Secondary | ICD-10-CM

## 2016-02-16 ENCOUNTER — Ambulatory Visit
Admission: RE | Admit: 2016-02-16 | Discharge: 2016-02-16 | Disposition: A | Discharge status: Home / Self Care | Payer: Self-pay | Source: Ambulatory Visit | Attending: Cardiology | Admitting: Cardiology

## 2016-02-16 ENCOUNTER — Ambulatory Visit
Admission: RE | Admit: 2016-02-16 | Discharge: 2016-02-16 | Disposition: A | Discharge status: Home / Self Care | Payer: No Typology Code available for payment source | Source: Ambulatory Visit | Attending: Cardiology | Admitting: Cardiology

## 2016-02-16 DIAGNOSIS — R918 Other nonspecific abnormal finding of lung field: Secondary | ICD-10-CM | POA: Diagnosis not present

## 2016-02-16 DIAGNOSIS — R059 Cough, unspecified: Secondary | ICD-10-CM

## 2016-02-16 DIAGNOSIS — R634 Abnormal weight loss: Secondary | ICD-10-CM

## 2016-02-16 DIAGNOSIS — I714 Abdominal aortic aneurysm, without rupture, unspecified: Secondary | ICD-10-CM

## 2016-02-16 DIAGNOSIS — R05 Cough: Secondary | ICD-10-CM

## 2016-02-16 MED ORDER — IOPAMIDOL (ISOVUE-300) INJECTION 61%
100.0000 mL | Freq: Once | INTRAVENOUS | Status: AC | PRN
  Administered 2016-02-16: 100 mL via INTRAVENOUS

## 2016-02-16 MED ORDER — IOPAMIDOL (ISOVUE-300) INJECTION 61%: 100.0000 mL | Freq: Once | INTRAVENOUS | Status: DC | PRN

## 2016-02-21 DIAGNOSIS — I714 Abdominal aortic aneurysm, without rupture: Secondary | ICD-10-CM | POA: Diagnosis not present

## 2016-02-21 DIAGNOSIS — M5127 Other intervertebral disc displacement, lumbosacral region: Secondary | ICD-10-CM | POA: Diagnosis not present

## 2016-02-22 ENCOUNTER — Encounter: Payer: Self-pay | Admitting: Vascular Surgery

## 2016-02-22 ENCOUNTER — Ambulatory Visit (INDEPENDENT_AMBULATORY_CARE_PROVIDER_SITE_OTHER): Payer: No Typology Code available for payment source | Admitting: Vascular Surgery

## 2016-02-22 VITALS — BP 136/73 | HR 60 | Temp 97.0°F | Resp 16 | Ht 76.0 in | Wt 146.0 lb

## 2016-02-22 DIAGNOSIS — I714 Abdominal aortic aneurysm, without rupture, unspecified: Secondary | ICD-10-CM

## 2016-02-22 NOTE — Progress Notes (Signed)
Established Abdominal Aortic Aneurysm  History of Present Illness  The patient is a 60 y.o. (12/30/1955) male who presents with chief complaint: follow up for AAA.  Previous studies demonstrate an AAA, measuring 4.6 cm x 4.7 cm.  A recent chest/abd/pelvis CT demonstrates: infrarenal AAA >5.9 cm.  The patient does not have abdominal pain.  He chronically has back pain.  The patient is a smoker.  The patient has been lost to follow since 02/04/14  The patient's PMH, PSH, SH, and FamHx are unchanged from 02/04/14.  Current Outpatient Prescriptions  Medication Sig Dispense Refill  . ibuprofen (ADVIL,MOTRIN) 200 MG tablet Take 200 mg by mouth every 6 (six) hours as needed for pain.    . methylPREDNIsolone (MEDROL DOSPACK) 4 MG tablet     . traMADol (ULTRAM) 50 MG tablet      No current facility-administered medications for this visit.     Allergies  Allergen Reactions  . Tramadol Nausea And Vomiting    On ROS today: no abd pain, no intermittent claudication   Physical Examination  Vitals:   02/22/16 0956  BP: 136/73  Pulse: 60  Resp: 16  Temp: 97 F (36.1 C)  SpO2: 98%  Weight: 146 lb (66.2 kg)  Height: '6\' 4"'$  (1.93 m)   Body mass index is 17.77 kg/m.  General: A&O x 3, WDWN  Pulmonary: Sym exp, good air movt, CTAB, no rales, rhonchi, & wheezing  Cardiac: RRR, Nl S1, S2, no Murmurs, rubs or gallops  Vascular: Vessel Right Left  Radial Palpable Palpable  Brachial Palpable Palpable  Carotid Palpable, without bruit Palpable, without bruit  Aorta Not palpable N/A  Femoral Palpable Palpable  Popliteal Not palpable Not palpable  PT Faintly Palpable Faintly Palpable  DP Faintly Palpable Faintly Palpable   Gastrointestinal: soft, NTND, no G/R, no HSM, no masses, no CVAT B, faintly palpable AAA  Musculoskeletal: M/S 5/5 throughout , Extremities without ischemic changes   Neurologic:  Pain and light touch intact in extremities , Motor exam as listed above  CT  chest/abd/pelvis with contrast (02/16/16) 1. Superior segment left lower lobe lung mass, consistent with primary bronchogenic carcinoma. No evidence of thoracic nodal or extra thoracic metastasis. 2. Infrarenal abdominal aortic aneurysm, maximally 6.3 cm. Vascular surgery consultation recommended due to increased risk of rupture for AAA >5.5 cm.  3. Coronary artery atherosclerosis. Aortic atherosclerosis. 4. Borderline ascending thoracic aortic aneurysm. 5. Right-sided pleural calcifications, suggesting prior empyema or hemothorax. 6. Probable hepatic steatosis. 7. Prostatomegaly. 8. Bilateral common iliac artery ectasia  I reviewed the patient's CT, the timing of the contrast does not allow full evaluation of the iliac arteries but there is clearly an infrarenal AAA > 6 cm.  The thoracic aorta is also enlarged but not enough to require intervention at this time.  The patient has an appointment with Cardiothoracic surgery so I will defer further comment on the ascending and descending thoracic aorta and lung mass to them.   Medical Decision Making  The patient is a 60 y.o. male who presents with: asymptomatic large AAA, small thoracic aortic aneurysm   Based on this patient's exam and diagnostic studies, he needs EVAR.  I will obtained a CTA abd/pelvis for sizing evaluation and full evaluation of the iliac arterial system.  The study will be obtained tomorrow and he will follow up on Friday.  I am also sending to Cardiology for preop optimization in the event conversion to open is necessary.  Thank you for allowing [place]  to participate in this patient's care.   Adele Barthel, MD, FACS [facility] Office: [phone] Pager: [phone]

## 2016-02-23 ENCOUNTER — Other Ambulatory Visit: Payer: Self-pay | Admitting: Vascular Surgery

## 2016-02-23 NOTE — Addendum Note (Signed)
Addended by: Mena Goes on: 02/23/2016 09:26 AM   Modules accepted: Orders

## 2016-02-24 ENCOUNTER — Encounter: Payer: Self-pay | Admitting: Vascular Surgery

## 2016-02-24 ENCOUNTER — Ambulatory Visit
Admission: RE | Admit: 2016-02-24 | Discharge: 2016-02-24 | Disposition: A | Discharge status: Home / Self Care | Payer: BLUE CROSS/BLUE SHIELD | Source: Ambulatory Visit | Attending: Vascular Surgery | Admitting: Vascular Surgery

## 2016-02-24 ENCOUNTER — Ambulatory Visit (INDEPENDENT_AMBULATORY_CARE_PROVIDER_SITE_OTHER): Payer: BLUE CROSS/BLUE SHIELD | Admitting: Vascular Surgery

## 2016-02-24 VITALS — BP 105/58 | HR 88 | Temp 97.2°F | Ht 76.0 in | Wt 145.3 lb

## 2016-02-24 DIAGNOSIS — I714 Abdominal aortic aneurysm, without rupture, unspecified: Secondary | ICD-10-CM

## 2016-02-24 MED ORDER — IOPAMIDOL (ISOVUE-370) INJECTION 76%
80.0000 mL | Freq: Once | INTRAVENOUS | Status: AC | PRN
  Administered 2016-02-24: 80 mL via INTRAVENOUS

## 2016-02-24 NOTE — Progress Notes (Signed)
Established Abdominal Aortic Aneurysm  History of Present Illness  The patient is a 60 y.o. (08/18/1955) male who presents with chief complaint: follow up for AAA.  The patient was seen just Wednesday.  He returns from his CTA abd/pelvis today.  He has had no back or abd pain.  He is scheduled to see Cardiology on 7 NOV 17.   Past Medical History:  Diagnosis Date  . AAA (abdominal aortic aneurysm) ([place])   . HNP (herniated nucleus pulposus), lumbar    L4 with radiculopathy    Past Surgical History:  Procedure Laterality Date  . INGUINAL HERNIA REPAIR Left 1975   Left inguinal hernia  . LUMBAR LAMINECTOMY  October 22, 2012    Social History   Social History  . Marital status: Married    Spouse name: N/A  . Number of children: N/A  . Years of education: N/A   Occupational History  . Not on file.   Social History Main Topics  . Smoking status: Current Every Day Smoker    Packs/day: 1.00    Types: Cigarettes  . Smokeless tobacco: Never Used  . Alcohol use 7.2 oz/week    12 Cans of beer per week  . 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 Sexual activity: Not on file   Other Topics Concern  . Not on file   Social History Narrative  . No narrative on file    Family History  Problem Relation Age of Onset  . Diabetes Mother     Current Outpatient Prescriptions  Medication Sig Dispense Refill  . ibuprofen (ADVIL,MOTRIN) 200 MG tablet Take 200 mg by mouth every 6 (six) hours as needed for pain.    . traMADol (ULTRAM) 50 MG tablet      No current facility-administered medications for this visit.      Allergies  Allergen Reactions  . Tramadol Nausea And Vomiting     REVIEW OF SYSTEMS:  (Positives checked otherwise negative)  CARDIOVASCULAR:   '[ ]'$  chest pain,  '[ ]'$  chest pressure,  '[ ]'$  palpitations,  '[ ]'$  shortness of breath when laying flat,  '[ ]'$  shortness of breath with exertion,   '[ ]'$  pain in feet when walking,    '[ ]'$  pain in feet when laying flat, '[ ]'$  history of blood clot in veins (DVT),  '[ ]'$  history of phlebitis,  '[ ]'$  swelling in legs,  '[ ]'$  varicose veins  PULMONARY:   '[ ]'$  productive cough,  '[ ]'$  asthma,  '[ ]'$  wheezing  NEUROLOGIC:   '[ ]'$  weakness in arms or legs,  '[ ]'$  numbness in arms or legs,  '[ ]'$  difficulty speaking or slurred speech,  '[ ]'$  temporary loss of vision in one eye,  '[ ]'$  dizziness  HEMATOLOGIC:   '[ ]'$  bleeding problems,  '[ ]'$  problems with blood clotting too easily  MUSCULOSKEL:   '[ ]'$  joint pain, '[ ]'$  joint swelling  GASTROINTEST:   '[ ]'$  vomiting blood,  '[ ]'$  blood in stool     GENITOURINARY:   '[ ]'$  burning with urination,  '[ ]'$  blood in urine  PSYCHIATRIC:   '[ ]'$  history of major depression  INTEGUMENTARY:   '[ ]'$  rashes,  '[ ]'$  ulcers  CONSTITUTIONAL:   '[ ]'$  fever,  '[ ]'$  chills   Physical Examination  Vitals:   02/24/16 1416  BP: (!) 105/58  Pulse: 88  Temp: 97.2 F (36.2 C)  TempSrc: Oral  SpO2: 96%  Weight: 145 lb 4.8 oz (65.9 kg)  Height: '6\' 4"'$  (1.93 m)   Body mass index is 17.69 kg/m.  General: A&O x 3, WD, thin  Pulmonary: Sym exp, good air movt, CTAB, no rales, rhonchi, & wheezing   Cardiac: RRR, Nl S1, S2, no Murmurs, rubs or gallops  Vascular: Vessel Right Left  Radial Palpable Palpable  Brachial Palpable Palpable  Carotid Palpable, without bruit Palpable, without bruit  Aorta Not palpable N/A  Femoral Palpable Palpable  Popliteal Not palpable Not palpable  PT Palpable Palpable  DP Palpable Palpable   Gastrointestinal: soft, NTND, no G/R, no HSM, no masses, no CVAT B  Musculoskeletal: M/S 5/5 throughout , Extremities without ischemic changes   Neurologic:  Pain and light touch intact in extremities, Motor exam as listed above  Non-Invasive Vascular Imaging  CTA abd/pelvis (02/24/2016) 1. Fusiform abdominal aortic aneurysm measuring 6.2 x 5.9 cm as detailed above. Aortic aneurysm NOS (ICD10-I71.9) 2. 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 The pain lumen still measures 6 mm at this location. 3.  Aortic Atherosclerosis (ICD10-170.0). 4. Ectatic but non aneurysmal bilateral common iliac arteries.  I reviewed this patient's CTA, he has an infrarenal AAA with adequate neck for an EVAR.  His access vessels appear adequate though both iliac arterial systems may have some resistance.   Medical Decision Making  The patient is a 60 y.o. male who presents with: asymptomatic large AAA    Based on this patient's exam and diagnostic studies, he needs EVAR. The patient is aware the risks of endovascular aortic surgery include but are not limited to: bleeding, need for transfusion, infection, death, stroke, paralysis, wound complications, spinal, pelvic and bowel ischemia, extended ventilation, anaphylactic reaction to contrast, contrast induced nephropathy, embolism, and need for additional procedure to address endoleaks.   Overall, I cited a mortality rate of 1-2% and morbidity rate of 15%.  I have him tenatively schedule on 16 NOV 17 with Dr. Scot Dock.  Thank you for allowing [place] to participate in this patient's care.   Adele Barthel, MD, FACS [facility] Office: [phone] Pager: [phone]

## 2016-02-28 ENCOUNTER — Other Ambulatory Visit: Payer: Self-pay | Admitting: *Deleted

## 2016-02-28 ENCOUNTER — Encounter: Payer: Self-pay | Admitting: Vascular Surgery

## 2016-02-28 ENCOUNTER — Encounter: Payer: Self-pay | Admitting: Cardiothoracic Surgery

## 2016-02-28 ENCOUNTER — Institutional Professional Consult (permissible substitution) (INDEPENDENT_AMBULATORY_CARE_PROVIDER_SITE_OTHER): Payer: BLUE CROSS/BLUE SHIELD | Admitting: Cardiothoracic Surgery

## 2016-02-28 VITALS — BP 148/88 | HR 73 | Resp 20 | Ht 76.0 in | Wt 147.0 lb

## 2016-02-28 DIAGNOSIS — J984 Other disorders of lung: Secondary | ICD-10-CM

## 2016-02-28 DIAGNOSIS — R918 Other nonspecific abnormal finding of lung field: Secondary | ICD-10-CM

## 2016-02-28 NOTE — Progress Notes (Signed)
[address]       [name] [address]             [phone]                    William K Tidmore [name] [place] Medical Record #[id number] Date of Birth: March 01, 1956  Referring: [name], MD Primary Care: [name], MD  Chief Complaint:    Chief Complaint  Patient presents with  . Lung Mass    Surgical eval, Chest CT 02/16/16    History of Present Illness:    William Kales [name] 60 y.o. male is seen in the office  today for evaluation of lung mass noted on ct of abdomen . Patient has long history tobacco use . To have stent graft of abdomen in coming 2 weeks for large AAA.       Current Activity/ Functional Status:  Patient is independent with mobility/ambulation, transfers, ADL's, IADL's.   Zubrod Score: At the time of surgery this patient's most appropriate activity status/level should be described as: '[]'$     0    Normal activity, no symptoms '[x]'$     1    Restricted in physical strenuous activity but ambulatory, able to do out light work '[]'$     2    Ambulatory and capable of self care, unable to do work activities, up and about               >50 % of waking hours                              '[]'$     3    Only limited self care, in bed greater than 50% of waking hours '[]'$     4    Completely disabled, no self care, confined to bed or chair '[]'$     5    Moribund   Past Medical History:  Diagnosis Date  . AAA (abdominal aortic aneurysm) ([place])   . HNP (herniated nucleus pulposus), lumbar    L4 with radiculopathy    Past Surgical History:  Procedure Laterality Date  . INGUINAL HERNIA REPAIR Left 1975   Left inguinal hernia  . LUMBAR LAMINECTOMY  October 22, 2012    Family History  Problem Relation Age of Onset  . Diabetes Mother     Social History   Social History  . Marital status: Married    Spouse name: N/A  . Number of children: N/A  . Years of education: N/A   Occupational History  . Not on file.   Social History Main Topics    . Smoking status: Current Every Day Smoker    Packs/day: 1.00    Types: Cigarettes  . Smokeless tobacco: Never Used  . Alcohol use 7.2 oz/week    12 Cans of beer per week  . 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 Sexual activity: Not on file   Other Topics Concern   Works as school custodian     History  Smoking Status  . Current Every Day Smoker  . Packs/day: 1.00  . Types: Cigarettes  Smokeless Tobacco  . Never Used    History  Alcohol Use  . 7.2 oz/week  . 12 Cans of beer per week     Allergies  Allergen Reactions  . Tramadol  Nausea And Vomiting    No current outpatient prescriptions on file.   No current facility-administered medications for this visit.         Review of Systems:     Cardiac Review of Systems: Y or N  Chest Pain [n    ]  Resting SOB [ n  ] Exertional SOB  [ y ]  Orthopnea n[  ]   Pedal Edema [ n  ]    Palpitations [n  ] Syncope  [n  ]   Presyncope [  n ]  General Review of Systems: [Y] = yes [  ]=no Constitional: recent weight change [ n ];  Wt loss over the last 3 months [   ] anorexia [  ]; fatigue [ y ]; nausea [  ]; night sweats [  ]; fever [  ]; or chills [  ];          Dental: poor dentition[  ]; Last Dentist visit:   Eye : blurred vision [  ]; diplopia [   ]; vision changes [  ];  Amaurosis fugax[  ]; Resp: cough Blue.Reese  ];  wheezing[ y ];  hemoptysis[  n]; shortness of breath[y  ]; paroxysmal nocturnal dyspnea[  ]; dyspnea on exertion[  ]; or orthopnea[  ];  GI:  gallstones[  ], vomiting[  ];  dysphagia[  ]; melena[  ];  hematochezia [  ]; heartburn[  ];   Hx of  Colonoscopy[ y ]; GU: kidney stones [  ]; hematuria[  ];   dysuria [  ];  nocturia[  ];  history of     obstruction [  ]; urinary frequency [  ]             Skin: rash, swelling[  ];, hair loss[  ];  peripheral edema[  ];  or itching[  ]; Musculosketetal: myalgias[  ];  joint swelling[  ];  joint erythema[  ];  joint pain[  ];  back  pain[  ];  Heme/Lymph: bruising[  ];  bleeding[  ];  anemia[  ];  Neuro: TIA[n  ];  headaches[ n ];  stroke[  ];  vertigo[  ];  seizures[  ];   paresthesias[  ];  difficulty walking[  ];  Psych:depression[  ]; anxiety[  ];  Endocrine: diabetes[ n ];  thyroid dysfunction[n  ];  Immunizations: Flu up to date [ n ]; Pneumococcal up to date [n  ];  Other:  Physical Exam: BP (!) 148/88 (BP Location: Right Arm, Patient Position: Sitting, Cuff Size: Normal)   Pulse 73   Resp 20   Ht '6\' 4"'$  (1.93 m)   Wt 147 lb (66.7 kg)   SpO2 97%   BMI 17.89 kg/m   PHYSICAL EXAMINATION: General appearance: alert, cooperative and appears older than stated age Head: Normocephalic, without obvious abnormality, atraumatic Neck: no adenopathy, no carotid bruit, no JVD, supple, symmetrical, trachea midline and thyroid not enlarged, symmetric, no tenderness/mass/nodules Lymph nodes: Cervical, supraclavicular, and axillary nodes normal. Resp: clear to auscultation bilaterally Back: symmetric, no curvature. ROM normal. No CVA tenderness. Cardio: regular rate and rhythm, S1, S2 normal, no murmur, click, rub or gallop GI: soft, non-tender; bowel sounds normal; no masses,  no organomegaly Extremities: extremities normal, atraumatic, no cyanosis or edema and clubbing of all fingers,  Neurologic: Grossly normal palpable AAA, palpable dt and pt pulses   Diagnostic Studies & Laboratory data:     Recent Radiology Findings:  Ct Chest W Contrast  Result Date: 02/16/2016 CLINICAL DATA:  Cough. Tobacco dependence. Weight loss. History of abdominal aortic aneurysm. Question lung cancer. EXAM: CT CHEST, ABDOMEN, AND PELVIS WITH CONTRAST TECHNIQUE: Multidetector CT imaging of the chest, abdomen and pelvis was performed following the standard protocol during bolus administration of intravenous contrast. CONTRAST:  135m ISOVUE-300 IOPAMIDOL (ISOVUE-300) INJECTION 61% [place] [address] Results: Creatinine 0.7 mg/dL. COMPARISON:  Chest radiograph of 12/06/2006.  No prior CTs. FINDINGS: CT CHEST FINDINGS Cardiovascular: Aortic and branch vessel atherosclerosis. Tortuous thoracic aorta. Borderline ascending thoracic aortic aneurysm, 4.0 cm. The proximal descending segment is borderline prominent at 3.4 cm. Cardiomegaly, accentuated by a mild pectus excavatum deformity. No pericardial effusion. No central pulmonary embolism, on this non-dedicated study. Mediastinum/Nodes: No supraclavicular adenopathy. 1.2 cm hypo attenuating left thyroid nodule is nonspecific. No mediastinal or hilar adenopathy. Mild soft tissue thickening anterior to the ascending aorta favored to represent a pericardial recess. Lungs/Pleura: No pleural fluid. Calcified right-sided pleural plaque. Moderate centrilobular and paraseptal emphysema. Probable secretions in the trachea. Lower lobe predominant bronchial wall thickening. Minimal motion degradation. Inferior right middle lobe pleural or subpleural 5 mm nodule on image 121/series 6. 2 mm medial right apex pulmonary nodule on image 56/series 6. A subpleural left upper lobe pulmonary nodule measures 5 mm on image 60/series 6. Superior segment left lower lobe lung mass measures 3.8 by 3.2 by 3.1 cm on transverse image 99/series 6 and sagittal image 86. This contacts the left major fissure over an approximately 1.7 cm span. Subpleural posterior left upper lobe 4 mm nodule on image 44/series 6. Musculoskeletal: No acute osseous abnormality. CT ABDOMEN PELVIS FINDINGS Hepatobiliary: Probable hepatic steatosis diffusely. More focal steatosis adjacent the falciform ligament. Scattered tiny low-density liver lesions are too small to characterize. Mild hepatomegaly at 18.9 cm. Normal gallbladder, without biliary ductal dilatation. Pancreas: Normal, without mass or ductal dilatation. Spleen: Normal in size, without focal abnormality. Adrenals/Urinary Tract: Normal  adrenal glands. Too small to characterize lesion in the interpolar left kidney is likely a cyst. Normal right kidney, without hydronephrosis. Normal urinary bladder. Stomach/Bowel: Normal stomach, without wall thickening. Normal colon, appendix, and terminal ileum. Normal small bowel. Vascular/Lymphatic: Infrarenal abdominal aortic aneurysm, maximally 5.9 x 6.3 cm on image 150/series 2. Extensive wall thrombus. No periaortic hemorrhage. No extension into the iliacs. Both common iliac arteries are mildly ectatic at 1.3 cm. No abdominopelvic adenopathy. Reproductive: Mild prostatomegaly.  Small left hydrocele. Other: No significant free fluid. No evidence of omental or peritoneal disease. Musculoskeletal: Advanced degenerative disc disease at the L4-5 level. IMPRESSION: 1. Superior segment left lower lobe lung mass, consistent with primary bronchogenic carcinoma. No evidence of thoracic nodal or extra thoracic metastasis. 2. Infrarenal abdominal aortic aneurysm, maximally 6.3 cm. Vascular surgery consultation recommended due to increased risk of rupture for AAA >5.5 cm. This recommendation follows ACR consensus guidelines: White Paper of the ACR Incidental Findings Committee II on Vascular Findings. J Am Coll Radiol 2013; 10:789-794. These results (impressions 1 and 2) will be called to the ordering clinician or representative by the Radiologist Assistant, and communication documented in the PACS or zVision Dashboard. 3. Coronary artery atherosclerosis. Aortic atherosclerosis. 4. Borderline ascending thoracic aortic aneurysm. 5. Right-sided pleural calcifications, suggesting prior empyema or hemothorax. 6. Probable hepatic steatosis. 7. Prostatomegaly. 8. Bilateral common iliac artery ectasia. Electronically Signed   By: KAbigail MiyamotoM.D.   On: 02/16/2016 11:24    I have independently  reviewed the above radiology studies  and reviewed the findings with the patient.   Recent Lab Findings: No results found for:  WBC, HGB, HCT, PLT, GLUCOSE, CHOL, TRIG, HDL, LDLDIRECT, LDLCALC, ALT, AST, NA, K, CL, CREATININE, BUN, CO2, TSH, INR, GLUF, HGBA1C    Assessment / Plan:    Superior segment left lower lobe lung mass, consistent with primary bronchogenic carcinoma. No evidence of thoracic nodal or extra thoracic metastasis.  Infrarenal abdominal aortic aneurysm, maximally 6.3 cm.    Discussed with the patient and wife the likely dx of lung cancer. We will obtain PFT's and PET scan to further evaluate   I  spent 40 minutes counseling the patient face to face and 50% or more the  time was spent in counseling and coordination of care. The total time spent in the appointment was 60 minutes.  [name] MD      [address] [place] [address] Office [phone]   Beeper [phone]  02/28/2016 5:54 PM

## 2016-02-28 NOTE — Patient Instructions (Signed)
Pulmonary Nodule A pulmonary nodule is a small, round growth of tissue in the lung. Pulmonary nodules can range in size from less than 1/5 inch (4 mm) to a little bigger than an inch (25 mm). Most pulmonary nodules are detected when imaging tests of the lung are being performed for a different problem. Pulmonary nodules are usually not cancerous (benign). However, some pulmonary nodules are cancerous (malignant). Follow-up treatment or testing is based on the size of the pulmonary nodule and your risk of getting lung cancer.  CAUSES Benign pulmonary nodules can be caused by various things. Some of the causes include:   Bacterial, fungal, or viral infections. This is usually an old infection that is no longer active, but it can sometimes be a current, active infection.  A benign mass of tissue.  Inflammation from conditions such as rheumatoid arthritis.   Abnormal blood vessels in the lungs. Malignant pulmonary nodules can result from lung cancer or from cancers that spread to the lung from other places in the body. SIGNS AND SYMPTOMS Pulmonary nodules usually do not cause symptoms. DIAGNOSIS Most often, pulmonary nodules are found incidentally when an X-ray or CT scan is performed to look for some other problem in the lung area. To help determine whether a pulmonary nodule is benign or malignant, your health care provider will take a medical history and order a variety of tests. Tests done may include:   Blood tests.  A skin test called a tuberculin test. This test is used to determine if you have been exposed to the germ that causes tuberculosis.   Chest X-rays. If possible, a new X-ray may be compared with X-rays you have had in the past.   CT scan. This test shows smaller pulmonary nodules more clearly than an X-ray.   Positron emission tomography (PET) scan. In this test, a safe amount of a radioactive substance is injected into the bloodstream. Then, the scan takes a picture of  the pulmonary nodule. The radioactive substance is eliminated from your body in your urine.   Biopsy. A tiny piece of the pulmonary nodule is removed so it can be checked under a microscope. TREATMENT  Pulmonary nodules that are benign normally do not require any treatment because they usually do not cause symptoms or breathing problems. Your health care provider may want to monitor the pulmonary nodule through follow-up CT scans. The frequency of these CT scans will vary based on the size of the nodule and the risk factors for lung cancer. For example, CT scans will need to be done more frequently if the pulmonary nodule is larger and if you have a history of smoking and a family history of cancer. Further testing or biopsies may be done if any follow-up CT scan shows that the size of the pulmonary nodule has increased. HOME CARE INSTRUCTIONS  Only take over-the-counter or prescription medicines as directed by your health care provider.  Keep all follow-up appointments with your health care provider. SEEK MEDICAL CARE IF:  You have trouble breathing when you are active.   You feel sick or unusually tired.   You do not feel like eating.   You lose weight without trying to.   You develop chills or night sweats.  SEEK IMMEDIATE MEDICAL CARE IF:  You cannot catch your breath, or you begin wheezing.   You cannot stop coughing.   You cough up blood.   You become dizzy or feel like you are going to pass out.   You   have sudden chest pain.   You have a fever or persistent symptoms for more than 2-3 days.   You have a fever and your symptoms suddenly get worse. MAKE SURE YOU:  Understand these instructions.  Will watch your condition.  Will get help right away if you are not doing well or get worse.   This information is not intended to replace advice given to you by your health care provider. Make sure you discuss any questions you have with your health care  provider.   Document Released: 02/04/2009 Document Revised: 12/10/2012 Document Reviewed: 09/29/2012 Elsevier Interactive Patient Education 2016 [place] Lung cancer occurs when abnormal cells in the lung grow out of control and form a mass (tumor). There are several types of lung cancer. The two most common types are:  Non-small cell. In this type of lung cancer, abnormal cells are larger and grow more slowly than those of small cell lung cancer.  Small cell. In this type of lung cancer, abnormal cells are smaller than those of non-small cell lung cancer. Small cell lung cancer gets worse faster than non-small cell lung cancer. CAUSES  The leading cause of lung cancer is smoking tobacco. The second leading cause is radon exposure. RISK FACTORS  Smoking tobacco.  Exposure to secondhand tobacco smoke.  Exposure to radon gas.  Exposure to asbestos.  Exposure to arsenic in drinking water.  Air pollution.  Family or personal history of lung cancer.  Lung radiation therapy.  Being older than 64 years. SIGNS AND SYMPTOMS  In the early stages, symptoms may not be present. As the cancer progresses, symptoms may include:  A lasting cough, possibly with blood.  Fatigue.  Unexplained weight loss.  Shortness of breath.  Wheezing.  Chest pain.  Loss of appetite. Symptoms of advanced lung cancer include:  Hoarseness.  Bone or joint pain.  Weakness.  Nail problems.  Face or arm swelling.  Paralysis of the face.  Drooping eyelids. DIAGNOSIS  Lung cancer can be identified with a physical exam and with tests such as:  A chest X-ray.  A CT scan.  Blood tests.  A biopsy. After a diagnosis is made, you will have more tests to determine the stage of the cancer. The stages of non-small cell lung cancer are:  Stage 0, also called carcinoma in situ. At this stage, abnormal cells are found in the inner lining of your lung or lungs.  Stage I. At  this stage, abnormal cells have grown into a tumor that is no larger than 5 cm across. The cancer has entered the deeper lung tissue but has not yet entered the lymph nodes or other parts of the body.  Stage II. At this stage, the tumor is 7 cm across or smaller and has entered nearby lymph nodes. Or, the tumor is 5 cm across or smaller and has invaded surrounding tissue but is not found in nearby lymph nodes. There may be more than one tumor present.  Stage III. At this stage, the tumor may be any size. There may be more than one tumor in the lungs. The cancer cells have spread to the lymph nodes and possibly to other organs.  Stage IV. At this stage, there are tumors in both lungs and the cancer has spread to other areas of the body. The stages of small cell lung cancer are:  Limited. At this stage, the cancer is found only on one side of the chest.  Extensive. At  this stage, the cancer is in the lungs and in tissues on the other side of the chest. The cancer has spread to other organs or is found in the fluid between the layers of your lungs. TREATMENT  Depending on the type and stage of your lung cancer, you may be treated with:  Surgery. This is done to remove a tumor.  Radiation therapy. This treatment destroys cancer cells using X-rays or other types of radiation.  Chemotherapy. This treatment uses medicines to destroy cancer cells.  Targeted therapy. This treatment aims to destroy only cancer cells instead of all cells as other therapies do. You may also have a combination of treatments. HOME CARE INSTRUCTIONS   Do not use any tobacco products. This includes cigarettes, chewing tobacco, and electronic cigarettes. If you need help quitting, ask your health care provider.  Take medicines only as directed by your health care provider.  Eat a healthy diet. Work with a dietitian to make sure you are getting the nutrition you need.  Consider joining a support group or seeking  counseling to help you cope with the stress of having lung cancer.  Let your cancer specialist (oncologist) know if you are admitted to the hospital.  Keep all follow-up visits as directed by your health care provider. This is important. SEEK MEDICAL CARE IF:   You lose weight without trying.  You have a persistent cough and wheezing.  You feel short of breath.  You tire easily.  You experience bone or joint pain.  You have difficulty swallowing.  You feel hoarse or notice your voice changing.  Your pain medicine is not helping. SEEK IMMEDIATE MEDICAL CARE IF:   You cough up blood.  You have new breathing problems.  You develop chest pain.  You develop swelling in:  One or both ankles or legs.  Your face, neck, or arms.  You are confused.  You experience paralysis in your face or a drooping eyelid.   This information is not intended to replace advice given to you by your health care provider. Make sure you discuss any questions you have with your health care provider.   Document Released: 07/16/2000 Document Revised: 12/29/2014 Document Reviewed: 08/13/2013 Elsevier Interactive Patient Education [employer].

## 2016-02-29 ENCOUNTER — Other Ambulatory Visit: Payer: Self-pay | Admitting: *Deleted

## 2016-03-02 ENCOUNTER — Other Ambulatory Visit: Payer: Self-pay

## 2016-03-05 ENCOUNTER — Other Ambulatory Visit (HOSPITAL_COMMUNITY): Payer: Self-pay | Admitting: *Deleted

## 2016-03-05 NOTE — Pre-Procedure Instructions (Addendum)
William Kales [name]  03/05/2016     Your procedure is scheduled on Monday, March 12, 2016 at 7:30 AM.   Report to [facility] Entrance "A" Admitting Office at 5:30 AM.   Call this number if you have problems the morning of surgery: [phone]   Questions prior to day of surgery, please call [phone] between 8 & 4 PM.   Remember:  Do not eat food or drink liquids after midnight 'Sunday, 03/11/16.  Do NOT smoke 24 hours prior to your surgery.    Do not wear jewelry.  Do not wear lotions, powders, cologne or deodorant.  Men may shave face and neck.  Do not bring valuables to the hospital.  [facility] is not responsible for any belongings or valuables.  Contacts, dentures or bridgework may not be worn into surgery.  Leave your suitcase in the car.  After surgery it may be brought to your room.  For patients admitted to the hospital, discharge time will be determined by your treatment team.  Special instructions:  - Preparing for Surgery  Before surgery, you can play an important role.  Because skin is not sterile, your skin needs to be as free of germs as possible.  You can reduce the number of germs on you skin by washing with CHG (chlorahexidine gluconate) soap before surgery.  CHG is an antiseptic cleaner which kills germs and bonds with the skin to continue killing germs even after washing.  Please DO NOT use if you have an allergy to CHG or antibacterial soaps.  If your skin becomes reddened/irritated stop using the CHG and inform your nurse when you arrive at Short Stay.  Do not shave (including legs and underarms) for at least 48 hours prior to the first CHG shower.  You may shave your face.  Please follow these instructions carefully:   1.  Shower with CHG Soap the night before surgery and the                    morning of Surgery.  2.  If you choose to wash your hair, wash your hair first as usual with your       normal shampoo.  3.  After  you shampoo, rinse your hair and body thoroughly to remove the shampoo.  4.  Use CHG as you would any other liquid soap.  You can apply chg directly       to the skin and wash gently with scrungie or a clean washcloth.  5.  Apply the CHG Soap to your body ONLY FROM THE NECK DOWN.        Do not use on open wounds or open sores.  Avoid contact with your eyes, ears, mouth and genitals (private parts).  Wash genitals (private parts) with your normal soap.  6.  Wash thoroughly, paying special attention to the area where your surgery        will be performed.  7.  Thoroughly rinse your body with warm water from the neck down.  8.  DO NOT shower/wash with your normal soap after using and rinsing off       the CHG Soap.  9.  Pat yourself dry with a clean towel.            10.  Wear clean pajamas.            11'$ .  Place clean sheets on your bed the night of your first  shower and do not        sleep with pets.  Day of Surgery  Do not apply any lotions/deodorants the morning of surgery.  Please wear clean clothes to the hospital.  Please read over the following fact sheets that you were given.

## 2016-03-06 ENCOUNTER — Encounter (HOSPITAL_COMMUNITY)
Admission: RE | Admit: 2016-03-06 | Discharge: 2016-03-06 | Disposition: A | Discharge status: Home / Self Care | Payer: BLUE CROSS/BLUE SHIELD | Source: Ambulatory Visit | Attending: Vascular Surgery | Admitting: Vascular Surgery

## 2016-03-06 ENCOUNTER — Encounter (HOSPITAL_COMMUNITY): Payer: Self-pay

## 2016-03-06 DIAGNOSIS — Z01810 Encounter for preprocedural cardiovascular examination: Secondary | ICD-10-CM | POA: Insufficient documentation

## 2016-03-06 DIAGNOSIS — R918 Other nonspecific abnormal finding of lung field: Secondary | ICD-10-CM | POA: Diagnosis not present

## 2016-03-06 DIAGNOSIS — Z01812 Encounter for preprocedural laboratory examination: Secondary | ICD-10-CM

## 2016-03-06 HISTORY — DX: Personal history of other infectious and parasitic diseases: Z86.19

## 2016-03-06 HISTORY — DX: Other nonspecific abnormal finding of lung field: R91.8

## 2016-03-06 HISTORY — DX: Asymptomatic varicose veins of bilateral lower extremities: I83.93

## 2016-03-06 LAB — URINALYSIS, ROUTINE W REFLEX MICROSCOPIC
Bilirubin Urine: NEGATIVE
GLUCOSE, UA: NEGATIVE mg/dL
Hgb urine dipstick: NEGATIVE
KETONES UR: NEGATIVE mg/dL
LEUKOCYTES UA: NEGATIVE
NITRITE: NEGATIVE
PROTEIN: NEGATIVE mg/dL
Specific Gravity, Urine: 1.015 (ref 1.005–1.030)
pH: 6.5 (ref 5.0–8.0)

## 2016-03-06 LAB — APTT: APTT: 30 s (ref 24–36)

## 2016-03-06 LAB — COMPREHENSIVE METABOLIC PANEL
ALBUMIN: 3.8 g/dL (ref 3.5–5.0)
ALK PHOS: 123 U/L (ref 38–126)
ALT: 11 U/L — AB (ref 17–63)
AST: 23 U/L (ref 15–41)
Anion gap: 8 (ref 5–15)
BUN: 7 mg/dL (ref 6–20)
CALCIUM: 9.3 mg/dL (ref 8.9–10.3)
CHLORIDE: 100 mmol/L — AB (ref 101–111)
CO2: 25 mmol/L (ref 22–32)
CREATININE: 0.65 mg/dL (ref 0.61–1.24)
GFR calc Af Amer: >60 mL/min (ref >60)
GFR calc non Af Amer: >60 mL/min (ref >60)
GLUCOSE: 104 mg/dL — AB (ref 65–99)
Potassium: 4 mmol/L (ref 3.5–5.1)
SODIUM: 133 mmol/L — AB (ref 135–145)
Total Bilirubin: 0.6 mg/dL (ref 0.3–1.2)
Total Protein: 7.4 g/dL (ref 6.5–8.1)

## 2016-03-06 LAB — SURGICAL PCR SCREEN
MRSA, PCR: NEGATIVE
Staphylococcus aureus: POSITIVE — AB

## 2016-03-06 LAB — CBC
HCT: 42.4 % (ref 39.0–52.0)
HEMOGLOBIN: 14.4 g/dL (ref 13.0–17.0)
MCH: 31.2 pg (ref 26.0–34.0)
MCHC: 34 g/dL (ref 30.0–36.0)
MCV: 92 fL (ref 78.0–100.0)
PLATELETS: 343 10*3/uL (ref 150–400)
RBC: 4.61 MIL/uL (ref 4.22–5.81)
RDW: 13.4 % (ref 11.5–15.5)
WBC: 8.2 10*3/uL (ref 4.0–10.5)

## 2016-03-06 LAB — TYPE AND SCREEN
ABO/RH(D): AB POS
Antibody Screen: NEGATIVE

## 2016-03-06 LAB — PROTIME-INR
INR: 1.11
Prothrombin Time: 14.4 seconds (ref 11.4–15.2)

## 2016-03-06 LAB — ABO/RH: ABO/RH(D): AB POS

## 2016-03-06 NOTE — Progress Notes (Signed)
[address]       ,[place] [id number]             [phone]                    William K Litle [name] [place] Medical Record #[id number] Date of Birth: 05-Jan-1956  Referring: [name], MD Primary Care: [name], MD  Chief Complaint:    Lung Mass and AAA   History of Present Illness:    William MOAN [name] 60 y.o. male is seen in the office  today for evaluation of lung mass noted on ct of abdomen . Patient has long history tobacco use . To have stent graft of abdomen in coming 2 weeks for large AAA.       Current Activity/ Functional Status:  Patient is independent with mobility/ambulation, transfers, ADL's, IADL's.   Zubrod Score: At the time of surgery this patient's most appropriate activity status/level should be described as: '[]'$     0    Normal activity, no symptoms '[x]'$     1    Restricted in physical strenuous activity but ambulatory, able to do out light work '[]'$     2    Ambulatory and capable of self care, unable to do work activities, up and about               >50 % of waking hours                              '[]'$     3    Only limited self care, in bed greater than 50% of waking hours '[]'$     4    Completely disabled, no self care, confined to bed or chair '[]'$     5    Moribund   Past Medical History:  Diagnosis Date  . AAA (abdominal aortic aneurysm) ([place])   . History of hepatitis B   . HNP (herniated nucleus pulposus), lumbar    L4 with radiculopathy  . Mass of left lung   . Varicose veins of legs     Past Surgical History:  Procedure Laterality Date  . COLONOSCOPY    . INGUINAL HERNIA REPAIR Left 1975   Left inguinal hernia  . INGUINAL HERNIA REPAIR Right   . LUMBAR LAMINECTOMY  October 22, 2012    Family History  Problem Relation Age of Onset  . Diabetes Mother     Social History   Social History  . Marital status: Married    Spouse name: N/A  . Number of children: N/A  . Years of education: N/A   Occupational  History  . Not on file.   Social History Main Topics  . Smoking status: Current Every Day Smoker    Packs/day: 1.00    Types: Cigarettes  . Smokeless tobacco: Never Used  . Alcohol use 7.2 oz/week    12 Cans of beer per week  . 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 Sexual activity: Not on file   Other Topics Concern   Works as school custodian     History  Smoking Status  . Former Smoker  . Packs/day: 1.00  . Types: Cigarettes  . Quit date: 03/05/2016  Smokeless Tobacco  . Never Used    History  Alcohol Use  . 7.2  oz/week  . 12 Cans of beer per week     Allergies  Allergen Reactions  . Tramadol Nausea And Vomiting    No current outpatient prescriptions on file.   No current facility-administered medications for this visit.    Facility-Administered Medications Ordered in Other Visits  Medication Dose Route Frequency Provider Last Rate Last Dose  . fludeoxyglucose F - 18 (FDG) injection 7.5 millicurie  7.5 millicurie Intravenous Once PRN Kalman Jewels, MD            Review of Systems:     Cardiac Review of Systems: Y or N  Chest Pain [n    ]  Resting SOB [ n  ] Exertional SOB  [ y ]  Orthopnea n[  ]   Pedal Edema [ n  ]    Palpitations [n  ] Syncope  [n  ]   Presyncope [  n ]  General Review of Systems: [Y] = yes [  ]=no Constitional: recent weight change [ n ];  Wt loss over the last 3 months [   ] anorexia [  ]; fatigue [ y ]; nausea [  ]; night sweats [  ]; fever [  ]; or chills [  ];          Dental: poor dentition[  ]; Last Dentist visit:   Eye : blurred vision [  ]; diplopia [   ]; vision changes [  ];  Amaurosis fugax[  ]; Resp: cough Blue.Reese  ];  wheezing[ y ];  hemoptysis[  n]; shortness of breath[y  ]; paroxysmal nocturnal dyspnea[  ]; dyspnea on exertion[  ]; or orthopnea[  ];  GI:  gallstones[  ], vomiting[  ];  dysphagia[  ]; melena[  ];  hematochezia [  ]; heartburn[  ];   Hx of  Colonoscopy[ y ]; GU:  kidney stones [  ]; hematuria[  ];   dysuria [  ];  nocturia[  ];  history of     obstruction [  ]; urinary frequency [  ]             Skin: rash, swelling[  ];, hair loss[  ];  peripheral edema[  ];  or itching[  ]; Musculosketetal: myalgias[  ];  joint swelling[  ];  joint erythema[  ];  joint pain[  ];  back pain[  ];  Heme/Lymph: bruising[  ];  bleeding[  ];  anemia[  ];  Neuro: TIA[n  ];  headaches[ n ];  stroke[  ];  vertigo[  ];  seizures[  ];   paresthesias[  ];  difficulty walking[  ];  Psych:depression[  ]; anxiety[  ];  Endocrine: diabetes[ n ];  thyroid dysfunction[n  ];  Immunizations: Flu up to date [ n ]; Pneumococcal up to date [n  ];  Other:  Physical Exam: There were no vitals taken for this visit.  PHYSICAL EXAMINATION: General appearance: alert, cooperative and appears older than stated age Head: Normocephalic, without obvious abnormality, atraumatic Neck: no adenopathy, no carotid bruit, no JVD, supple, symmetrical, trachea midline and thyroid not enlarged, symmetric, no tenderness/mass/nodules Lymph nodes: Cervical, supraclavicular, and axillary nodes normal. Resp: clear to auscultation bilaterally Back: symmetric, no curvature. ROM normal. No CVA tenderness. Cardio: regular rate and rhythm, S1, S2 normal, no murmur, click, rub or gallop GI: soft, non-tender; bowel sounds normal; no masses,  no organomegaly Extremities: extremities normal, atraumatic, no cyanosis or edema and clubbing of all fingers,  Neurologic: Grossly  normal palpable AAA, palpable dt and pt pulses   Diagnostic Studies & Laboratory data:     Recent Radiology Findings:  Nm Pet Image Initial (pi) Skull Base To Thigh  Result Date: 03/07/2016 CLINICAL DATA:  Initial treatment strategy for left lung mass. EXAM: NUCLEAR MEDICINE PET SKULL BASE TO THIGH TECHNIQUE: 7.5 mCi F-18 FDG was injected intravenously. Full-ring PET imaging was performed from the skull base to thigh after the radiotracer. CT  data was obtained and used for attenuation correction and anatomic localization. FASTING BLOOD GLUCOSE:  Value: 114 mg/dl COMPARISON:  Chest CT 02/16/2016 FINDINGS: NECK No hypermetabolic lymph nodes in the neck. CHEST No hypermetabolic mediastinal or hilar nodes. The left lower lobe lung mass measures 3.8 x 3.2 cm and is metabolically active with SUV max of 9.3. Extensive pleural calcifications noted in the right lung posteriorly likely due to prior hemothorax or old empyema. Small scattered subpleural pulmonary nodules are stable. Recommend attention on future scans. Stable emphysematous changes and pulmonary scarring. 4 cm ascending aortic aneurysm. ABDOMEN/PELVIS No abnormal hypermetabolic activity within the liver, pancreas, adrenal glands, or spleen. No hypermetabolic lymph nodes in the abdomen or pelvis. New line stable 6 cm infrarenal abdominal aortic aneurysm. SKELETON No focal hypermetabolic activity to suggest skeletal metastasis. IMPRESSION: 1. 3.8 x 3.2 cm superior segment left lower lobe lung mass is hypermetabolic and consistent with primary lung neoplasm. No mediastinal or hilar lymphadenopathy and no findings to suggest metastatic disease. 2. A few small scattered pulmonary nodules are likely benign but will require surveillance. 3. Stable ascending aortic aneurysm and infrarenal abdominal aortic aneurysm. Electronically Signed   By: Marijo Sanes M.D.   On: 03/07/2016 16:54    I have independently reviewed the above radiology studies  and reviewed the findings with the patient.   Ct Chest W Contrast  Result Date: 02/16/2016 CLINICAL DATA:  Cough. Tobacco dependence. Weight loss. History of abdominal aortic aneurysm. Question lung cancer. EXAM: CT CHEST, ABDOMEN, AND PELVIS WITH CONTRAST TECHNIQUE: Multidetector CT imaging of the chest, abdomen and pelvis was performed following the standard protocol during bolus administration of intravenous contrast. CONTRAST:  183m ISOVUE-300 IOPAMIDOL  (ISOVUE-300) INJECTION 61% Creatinine was obtained on site at [facility] [address] Results: Creatinine 0.7 mg/dL. COMPARISON:  Chest radiograph of 12/06/2006.  No prior CTs. FINDINGS: CT CHEST FINDINGS Cardiovascular: Aortic and branch vessel atherosclerosis. Tortuous thoracic aorta. Borderline ascending thoracic aortic aneurysm, 4.0 cm. The proximal descending segment is borderline prominent at 3.4 cm. Cardiomegaly, accentuated by a mild pectus excavatum deformity. No pericardial effusion. No central pulmonary embolism, on this non-dedicated study. Mediastinum/Nodes: No supraclavicular adenopathy. 1.2 cm hypo attenuating left thyroid nodule is nonspecific. No mediastinal or hilar adenopathy. Mild soft tissue thickening anterior to the ascending aorta favored to represent a pericardial recess. Lungs/Pleura: No pleural fluid. Calcified right-sided pleural plaque. Moderate centrilobular and paraseptal emphysema. Probable secretions in the trachea. Lower lobe predominant bronchial wall thickening. Minimal motion degradation. Inferior right middle lobe pleural or subpleural 5 mm nodule on image 121/series 6. 2 mm medial right apex pulmonary nodule on image 56/series 6. A subpleural left upper lobe pulmonary nodule measures 5 mm on image 60/series 6. Superior segment left lower lobe lung mass measures 3.8 by 3.2 by 3.1 cm on transverse image 99/series 6 and sagittal image 86. This contacts the left major fissure over an approximately 1.7 cm span. Subpleural posterior left upper lobe 4 mm nodule on image 44/series 6. Musculoskeletal: No acute osseous abnormality. CT  ABDOMEN PELVIS FINDINGS Hepatobiliary: Probable hepatic steatosis diffusely. More focal steatosis adjacent the falciform ligament. Scattered tiny low-density liver lesions are too small to characterize. Mild hepatomegaly at 18.9 cm. Normal gallbladder, without biliary ductal dilatation. Pancreas: Normal, without mass or ductal dilatation.  Spleen: Normal in size, without focal abnormality. Adrenals/Urinary Tract: Normal adrenal glands. Too small to characterize lesion in the interpolar left kidney is likely a cyst. Normal right kidney, without hydronephrosis. Normal urinary bladder. Stomach/Bowel: Normal stomach, without wall thickening. Normal colon, appendix, and terminal ileum. Normal small bowel. Vascular/Lymphatic: Infrarenal abdominal aortic aneurysm, maximally 5.9 x 6.3 cm on image 150/series 2. Extensive wall thrombus. No periaortic hemorrhage. No extension into the iliacs. Both common iliac arteries are mildly ectatic at 1.3 cm. No abdominopelvic adenopathy. Reproductive: Mild prostatomegaly.  Small left hydrocele. Other: No significant free fluid. No evidence of omental or peritoneal disease. Musculoskeletal: Advanced degenerative disc disease at the L4-5 level. IMPRESSION: 1. Superior segment left lower lobe lung mass, consistent with primary bronchogenic carcinoma. No evidence of thoracic nodal or extra thoracic metastasis. 2. Infrarenal abdominal aortic aneurysm, maximally 6.3 cm. Vascular surgery consultation recommended due to increased risk of rupture for AAA >5.5 cm. This recommendation follows ACR consensus guidelines: White Paper of the ACR Incidental Findings Committee II on Vascular Findings. J Am Coll Radiol 2013; 10:789-794. These results (impressions 1 and 2) will be called to the ordering clinician or representative by the Radiologist Assistant, and communication documented in the PACS or zVision Dashboard. 3. Coronary artery atherosclerosis. Aortic atherosclerosis. 4. Borderline ascending thoracic aortic aneurysm. 5. Right-sided pleural calcifications, suggesting prior empyema or hemothorax. 6. Probable hepatic steatosis. 7. Prostatomegaly. 8. Bilateral common iliac artery ectasia. Electronically Signed   By: Abigail Miyamoto M.D.   On: 02/16/2016 11:24    I have independently reviewed the above radiology studies  and reviewed  the findings with the patient.   Recent Lab Findings: Lab Results  Component Value Date   WBC 8.2 03/06/2016   HGB 14.4 03/06/2016   HCT 42.4 03/06/2016   PLT 343 03/06/2016   GLUCOSE 104 (H) 03/06/2016   ALT 11 (L) 03/06/2016   AST 23 03/06/2016   NA 133 (L) 03/06/2016   K 4.0 03/06/2016   CL 100 (L) 03/06/2016   CREATININE 0.65 03/06/2016   BUN 7 03/06/2016   CO2 25 03/06/2016   INR 1.11 03/06/2016   PFT's FEV1 2.53   68% DLCO 14.57  37%  Conclusions: Although there is airway obstruction and a diffusion defect suggesting emphysema, the absence of overinflation is inconsistent with that diagnosis. In view of the severity of the diffusion defect, studies with exercise would be helpful to evaluate the presence of hypoxemia.     Assessment / Plan:    Superior segment left lower lobe lung mass, consistent with primary bronchogenic carcinoma. No evidence of thoracic nodal or extra thoracic metastasis.  Infrarenal abdominal aortic aneurysm, maximally 6.3 cm. Minimal Obstructive Airways Disease Severe Diffusion Defect    William Isaac MD      [address] [place] [address] Office [phone]   Beeper [phone]  03/07/2016 5:06 PM

## 2016-03-06 NOTE — Progress Notes (Signed)
Mupirocin Ointment Rx called into [facility] on [address] for positive PCR of Staph. Pt's wife, [place] notified and voiced understanding.

## 2016-03-06 NOTE — Progress Notes (Signed)
Pt denies cardiac history, chest pain or sob. Pt is not diabetic.

## 2016-03-07 ENCOUNTER — Encounter: Payer: BLUE CROSS/BLUE SHIELD | Admitting: Cardiothoracic Surgery

## 2016-03-07 ENCOUNTER — Ambulatory Visit (HOSPITAL_COMMUNITY)
Admission: RE | Admit: 2016-03-07 | Discharge: 2016-03-07 | Disposition: A | Discharge status: Home / Self Care | Payer: BLUE CROSS/BLUE SHIELD | Source: Ambulatory Visit | Attending: Cardiothoracic Surgery | Admitting: Cardiothoracic Surgery

## 2016-03-07 ENCOUNTER — Encounter (HOSPITAL_COMMUNITY)
Admission: RE | Admit: 2016-03-07 | Discharge: 2016-03-07 | Disposition: A | Discharge status: Home / Self Care | Payer: BLUE CROSS/BLUE SHIELD | Source: Ambulatory Visit | Attending: Cardiothoracic Surgery | Admitting: Cardiothoracic Surgery

## 2016-03-07 DIAGNOSIS — R918 Other nonspecific abnormal finding of lung field: Secondary | ICD-10-CM | POA: Insufficient documentation

## 2016-03-07 LAB — PULMONARY FUNCTION TEST
DL/VA % pred: 49 %
DL/VA: 2.42 ml/min/mmHg/L
DLCO cor % pred: 37 %
DLCO cor: 14.65 ml/min/mmHg
DLCO unc % pred: 37 %
DLCO unc: 14.57 ml/min/mmHg
FEF 25-75 Post: 1.4 L/sec
FEF 25-75 Pre: 1.19 L/sec
FEF2575-%Change-Post: 17 %
FEF2575-%Pred-Post: 42 %
FEF2575-%Pred-Pre: 36 %
FEV1-%Change-Post: 7 %
FEV1-%Pred-Post: 73 %
FEV1-%Pred-Pre: 68 %
FEV1-Post: 2.72 L
FEV1-Pre: 2.53 L
FEV1FVC-%Change-Post: 3 %
FEV1FVC-%Pred-Pre: 73 %
FEV6-%Change-Post: 1 %
FEV6-%Pred-Post: 98 %
FEV6-%Pred-Pre: 96 %
FEV6-Post: 4.5 L
FEV6-Pre: 4.42 L
FEV6FVC-%Change-Post: -1 %
FEV6FVC-%Pred-Post: 102 %
FEV6FVC-%Pred-Pre: 103 %
FVC-%Change-Post: 3 %
FVC-%Pred-Post: 96 %
FVC-%Pred-Pre: 93 %
FVC-Post: 4.59 L
FVC-Pre: 4.44 L
Post FEV1/FVC ratio: 59 %
Post FEV6/FVC ratio: 98 %
Pre FEV1/FVC ratio: 57 %
Pre FEV6/FVC Ratio: 100 %
RV % pred: 152 %
RV: 3.92 L
TLC % pred: 104 %
TLC: 8.39 L

## 2016-03-07 LAB — GLUCOSE, CAPILLARY: Glucose-Capillary: 119 mg/dL — ABNORMAL HIGH (ref 65–99)

## 2016-03-07 MED ORDER — FLUDEOXYGLUCOSE F - 18 (FDG) INJECTION: 7.5000 | Freq: Once | INTRAVENOUS | Status: DC | PRN

## 2016-03-07 MED ORDER — ALBUTEROL SULFATE (2.5 MG/3ML) 0.083% IN NEBU
2.5000 mg | INHALATION_SOLUTION | Freq: Once | RESPIRATORY_TRACT | Status: AC
  Administered 2016-03-07: 2.5 mg via RESPIRATORY_TRACT

## 2016-03-07 NOTE — Progress Notes (Signed)
This encounter was created in error - please disregard.

## 2016-03-07 NOTE — Progress Notes (Signed)
[address]       [place] [address]             [phone]                    William K Rakestraw [name] [place] [facility] Record #[id number] Date of Birth: 1955-07-10  Referring: William Huh, MD Primary Care: William Nettle, MD  Chief Complaint:    Lung Mass and AAA   History of Present Illness:    William POLICE [name] 60 y.o. male is seen in the office  today for evaluation of lung mass noted on ct of abdomen . Patient has long history tobacco use . He is to have a stent graft of a large abdominal aneurysm early next week. PFTs and PET scan were done yesterday for evaluation of his left lung mass. He's radiographic findings were reviewed in multidisciplinary thoracic oncology conference this morning.    Current Activity/ Functional Status:  Patient is independent with mobility/ambulation, transfers, ADL's, IADL's.   Zubrod Score: At the time of surgery this patient's most appropriate activity status/level should be described as: '[]'$     0    Normal activity, no symptoms '[x]'$     1    Restricted in physical strenuous activity but ambulatory, able to do out light work '[]'$     2    Ambulatory and capable of self care, unable to do work activities, up and about               >50 % of waking hours                              '[]'$     3    Only limited self care, in bed greater than 50% of waking hours '[]'$     4    Completely disabled, no self care, confined to bed or chair '[]'$     5    Moribund   Past Medical History:  Diagnosis Date  . AAA (abdominal aortic aneurysm) ([place])   . History of hepatitis B   . HNP (herniated nucleus pulposus), lumbar    L4 with radiculopathy  . Mass of left lung   . Varicose veins of legs     Past Surgical History:  Procedure Laterality Date  . COLONOSCOPY    . INGUINAL HERNIA REPAIR Left 1975   Left inguinal hernia  . INGUINAL HERNIA REPAIR Right   . LUMBAR LAMINECTOMY  October 22, 2012    Family History  Problem Relation Age of  Onset  . Diabetes Mother     Social History   Social History  . Marital status: Married    Spouse name: N/A  . Number of children: N/A  . Years of education: N/A   Occupational History  . Not on file.   Social History Main Topics  . Smoking status: Current Every Day Smoker    Packs/day: 1.00    Types: Cigarettes  . Smokeless tobacco: Never Used  . Alcohol use 7.2 oz/week    12 Cans of beer per week  . 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 Sexual activity: Not on file   Other Topics Concern   Works as school custodian     History  Smoking Status  . Former Smoker  . Packs/day: 1.00  .  Types: Cigarettes  . Quit date: 03/05/2016  Smokeless Tobacco  . Never Used    History  Alcohol Use  . 7.2 oz/week  . 12 Cans of beer per week     Allergies  Allergen Reactions  . Tramadol Nausea And Vomiting    No current outpatient prescriptions on file.   No current facility-administered medications for this visit.    Facility-Administered Medications Ordered in Other Visits  Medication Dose Route Frequency Provider Last Rate Last Dose  . fludeoxyglucose F - 18 (FDG) injection 7.5 millicurie  7.5 millicurie Intravenous Once PRN William Jewels, MD            Review of Systems:     Cardiac Review of Systems: Y or N  Chest Pain [n    ]  Resting SOB [ n  ] Exertional SOB  [ y ]  Orthopnea n[  ]   Pedal Edema [ n  ]    Palpitations [n  ] Syncope  [n  ]   Presyncope [  n ]  General Review of Systems: [Y] = yes [  ]=no Constitional: recent weight change [ n ];  Wt loss over the last 3 months [   ] anorexia [  ]; fatigue [ y ]; nausea [  ]; night sweats [  ]; fever [  ]; or chills [  ];          Dental: poor dentition[  ]; Last Dentist visit:   Eye : blurred vision [  ]; diplopia [   ]; vision changes [  ];  Amaurosis fugax[  ]; Resp: cough Blue.Reese  ];  wheezing[ y ];  hemoptysis[  n]; shortness of breath[y  ]; paroxysmal nocturnal  dyspnea[  ]; dyspnea on exertion[  ]; or orthopnea[  ];  GI:  gallstones[  ], vomiting[  ];  dysphagia[  ]; melena[  ];  hematochezia [  ]; heartburn[  ];   Hx of  Colonoscopy[ y ]; GU: kidney stones [  ]; hematuria[  ];   dysuria [  ];  nocturia[  ];  history of     obstruction [  ]; urinary frequency [  ]             Skin: rash, swelling[  ];, hair loss[  ];  peripheral edema[  ];  or itching[  ]; Musculosketetal: myalgias[  ];  joint swelling[  ];  joint erythema[  ];  joint pain[  ];  back pain[  ];  Heme/Lymph: bruising[  ];  bleeding[  ];  anemia[  ];  Neuro: TIA[n  ];  headaches[ n ];  stroke[  ];  vertigo[  ];  seizures[  ];   paresthesias[  ];  difficulty walking[  ];  Psych:depression[  ]; anxiety[  ];  Endocrine: diabetes[ n ];  thyroid dysfunction[n  ];  Immunizations: Flu up to date [ n ]; Pneumococcal up to date [n  ];  Other:  Physical Exam: BP (!) 150/86 (BP Location: Right Arm, Patient Position: Sitting, Cuff Size: Normal)   Pulse 70   Resp 18   Ht '6\' 3"'$  (1.905 m)   Wt 148 lb (67.1 kg)   SpO2 98% Comment: RA  BMI 18.50 kg/m   PHYSICAL EXAMINATION: General appearance: alert, cooperative and appears older than stated age Head: Normocephalic, without obvious abnormality, atraumatic Neck: no adenopathy, no carotid bruit, no JVD, supple, symmetrical, trachea midline and thyroid not enlarged, symmetric, no tenderness/mass/nodules Lymph  nodes: Cervical, supraclavicular, and axillary nodes normal. Resp: clear to auscultation bilaterally Back: symmetric, no curvature. ROM normal. No CVA tenderness. Cardio: regular rate and rhythm, S1, S2 normal, no murmur, click, rub or gallop GI: soft, non-tender; bowel sounds normal; no masses,  no organomegaly Extremities: extremities normal, atraumatic, no cyanosis or edema and clubbing of all fingers,  Neurologic: Grossly normal palpable AAA, palpable dt and pt pulses patient has significant bruit heard over the abdomen and even  radiating into the lower back bilaterally, he has no cardiac murmur.   Diagnostic Studies & Laboratory data:     Recent Radiology Findings:  Nm Pet Image Initial (pi) Skull Base To Thigh  Result Date: 03/07/2016 CLINICAL DATA:  Initial treatment strategy for left lung mass. EXAM: NUCLEAR MEDICINE PET SKULL BASE TO THIGH TECHNIQUE: 7.5 mCi F-18 FDG was injected intravenously. Full-ring PET imaging was performed from the skull base to thigh after the radiotracer. CT data was obtained and used for attenuation correction and anatomic localization. FASTING BLOOD GLUCOSE:  Value: 114 mg/dl COMPARISON:  Chest CT 02/16/2016 FINDINGS: NECK No hypermetabolic lymph nodes in the neck. CHEST No hypermetabolic mediastinal or hilar nodes. The left lower lobe lung mass measures 3.8 x 3.2 cm and is metabolically active with SUV max of 9.3. Extensive pleural calcifications noted in the right lung posteriorly likely due to prior hemothorax or old empyema. Small scattered subpleural pulmonary nodules are stable. Recommend attention on future scans. Stable emphysematous changes and pulmonary scarring. 4 cm ascending aortic aneurysm. ABDOMEN/PELVIS No abnormal hypermetabolic activity within the liver, pancreas, adrenal glands, or spleen. No hypermetabolic lymph nodes in the abdomen or pelvis. New line stable 6 cm infrarenal abdominal aortic aneurysm. SKELETON No focal hypermetabolic activity to suggest skeletal metastasis. IMPRESSION: 1. 3.8 x 3.2 cm superior segment left lower lobe lung mass is hypermetabolic and consistent with primary lung neoplasm. No mediastinal or hilar lymphadenopathy and no findings to suggest metastatic disease. 2. A few small scattered pulmonary nodules are likely benign but will require surveillance. 3. Stable ascending aortic aneurysm and infrarenal abdominal aortic aneurysm. Electronically Signed   By: Marijo Sanes M.D.   On: 03/07/2016 16:54    I have independently reviewed the above radiology  studies  and reviewed the findings with the patient.   Ct Chest W Contrast  Result Date: 02/16/2016 CLINICAL DATA:  Cough. Tobacco dependence. Weight loss. History of abdominal aortic aneurysm. Question lung cancer. EXAM: CT CHEST, ABDOMEN, AND PELVIS WITH CONTRAST TECHNIQUE: Multidetector CT imaging of the chest, abdomen and pelvis was performed following the standard protocol during bolus administration of intravenous contrast. CONTRAST:  160m ISOVUE-300 IOPAMIDOL (ISOVUE-300) INJECTION 61% Creatinine [facility] [address] Results: Creatinine 0.7 mg/dL. COMPARISON:  Chest radiograph of 12/06/2006.  No prior CTs. FINDINGS: CT CHEST FINDINGS Cardiovascular: Aortic and branch vessel atherosclerosis. Tortuous thoracic aorta. Borderline ascending thoracic aortic aneurysm, 4.0 cm. The proximal descending segment is borderline prominent at 3.4 cm. Cardiomegaly, accentuated by a mild pectus excavatum deformity. No pericardial effusion. No central pulmonary embolism, on this non-dedicated study. Mediastinum/Nodes: No supraclavicular adenopathy. 1.2 cm hypo attenuating left thyroid nodule is nonspecific. No mediastinal or hilar adenopathy. Mild soft tissue thickening anterior to the ascending aorta favored to represent a pericardial recess. Lungs/Pleura: No pleural fluid. Calcified right-sided pleural plaque. Moderate centrilobular and paraseptal emphysema. Probable secretions in the trachea. Lower lobe predominant bronchial wall thickening. Minimal motion degradation. Inferior right middle lobe pleural or subpleural 5 mm  nodule on image 121/series 6. 2 mm medial right apex pulmonary nodule on image 56/series 6. A subpleural left upper lobe pulmonary nodule measures 5 mm on image 60/series 6. Superior segment left lower lobe lung mass measures 3.8 by 3.2 by 3.1 cm on transverse image 99/series 6 and sagittal image 86. This contacts the left major fissure over an  approximately 1.7 cm span. Subpleural posterior left upper lobe 4 mm nodule on image 44/series 6. Musculoskeletal: No acute osseous abnormality. CT ABDOMEN PELVIS FINDINGS Hepatobiliary: Probable hepatic steatosis diffusely. More focal steatosis adjacent the falciform ligament. Scattered tiny low-density liver lesions are too small to characterize. Mild hepatomegaly at 18.9 cm. Normal gallbladder, without biliary ductal dilatation. Pancreas: Normal, without mass or ductal dilatation. Spleen: Normal in size, without focal abnormality. Adrenals/Urinary Tract: Normal adrenal glands. Too small to characterize lesion in the interpolar left kidney is likely a cyst. Normal right kidney, without hydronephrosis. Normal urinary bladder. Stomach/Bowel: Normal stomach, without wall thickening. Normal colon, appendix, and terminal ileum. Normal small bowel. Vascular/Lymphatic: Infrarenal abdominal aortic aneurysm, maximally 5.9 x 6.3 cm on image 150/series 2. Extensive wall thrombus. No periaortic hemorrhage. No extension into the iliacs. Both common iliac arteries are mildly ectatic at 1.3 cm. No abdominopelvic adenopathy. Reproductive: Mild prostatomegaly.  Small left hydrocele. Other: No significant free fluid. No evidence of omental or peritoneal disease. Musculoskeletal: Advanced degenerative disc disease at the L4-5 level. IMPRESSION: 1. Superior segment left lower lobe lung mass, consistent with primary bronchogenic carcinoma. No evidence of thoracic nodal or extra thoracic metastasis. 2. Infrarenal abdominal aortic aneurysm, maximally 6.3 cm. Vascular surgery consultation recommended due to increased risk of rupture for AAA >5.5 cm. This recommendation follows ACR consensus guidelines: White Paper of the ACR Incidental Findings Committee II on Vascular Findings. J Am Coll Radiol 2013; 10:789-794. These results (impressions 1 and 2) will be called to the ordering clinician or representative by the Radiologist Assistant,  and communication documented in the PACS or zVision Dashboard. 3. Coronary artery atherosclerosis. Aortic atherosclerosis. 4. Borderline ascending thoracic aortic aneurysm. 5. Right-sided pleural calcifications, suggesting prior empyema or hemothorax. 6. Probable hepatic steatosis. 7. Prostatomegaly. 8. Bilateral common iliac artery ectasia. Electronically Signed   By: Abigail Miyamoto M.D.   On: 02/16/2016 11:24    I have independently reviewed the above radiology studies  and reviewed the findings with the patient.   Recent Lab Findings: Lab Results  Component Value Date   WBC 8.2 03/06/2016   HGB 14.4 03/06/2016   HCT 42.4 03/06/2016   PLT 343 03/06/2016   GLUCOSE 104 (H) 03/06/2016   ALT 11 (L) 03/06/2016   AST 23 03/06/2016   NA 133 (L) 03/06/2016   K 4.0 03/06/2016   CL 100 (L) 03/06/2016   CREATININE 0.65 03/06/2016   BUN 7 03/06/2016   CO2 25 03/06/2016   INR 1.11 03/06/2016   PFT's FEV1 2.53   68% DLCO 14.57  37%  Conclusions: Although there is airway obstruction and a diffusion defect suggesting emphysema, the absence of overinflation is inconsistent with that diagnosis. In view of the severity of the diffusion defect, studies with exercise would be helpful to evaluate the presence of hypoxemia.   6 Minute Walk Test Results  Patient:            William MAYSE [name] Date:                03/08/2016   Supplemental O2 during test? NO  Baseline                                 End  Time                            1106                                        1112 Heartrate                     70                                            81 Dyspnea                      0                                              0 Fatigue                        0                                              0 O2 sat                          98%                                         99% Blood pressure            150/86                                   158/   Patient ambulated at a NORMAL pace for a total distance of 1176 feet with NO stops.  Ambulation was limited primarily due to N/A  Overall the test was tolerated VERY WELL with no complaints of any discomfort       Assessment / Plan:   Superior segment left lower lobe lung mass, consistent with primary bronchogenic carcinoma. No evidence of thoracic nodal or extra thoracic metastasis. 3.8 x 3.2 cm superior segment left lower lobe lung mass is hypermetabolic and consistent with primary lung neoplasm. No mediastinal or hilar lymphadenopathy and no findings to suggest metastatic disease -  Clinicial stage IB disease (cT2a,cN0,cM0) I discussed with the patient and his wife in detail the surgical options for treatment of what appears to be primary carcinoma of the lung. I recommend to the patient that he proceed Monday with stenting of his abdominal graft and soon after this we will arrange for needle biopsy of the left lower lung lesion. With his diffusion did deficit, although  reasonable 6 minute walk test further evaluation prior to lung resection would be necessary. I will see the patient back soon after his stent graft in the abdominal aorta is replaced and proceed with further evaluation and planning for definitive treatment of his lung cancer.  Infrarenal abdominal aortic aneurysm, maximally 6.3 cm. Minimal Obstructive Airways Disease Severe Diffusion Defect    [name] MD      [address] [place] [address] Office [phone]   [place]

## 2016-03-08 ENCOUNTER — Ambulatory Visit (INDEPENDENT_AMBULATORY_CARE_PROVIDER_SITE_OTHER): Payer: BLUE CROSS/BLUE SHIELD | Admitting: Cardiothoracic Surgery

## 2016-03-08 ENCOUNTER — Encounter: Payer: Self-pay | Admitting: Cardiothoracic Surgery

## 2016-03-08 VITALS — BP 150/86 | HR 70 | Resp 18 | Ht 75.0 in | Wt 148.0 lb

## 2016-03-08 DIAGNOSIS — J984 Other disorders of lung: Secondary | ICD-10-CM | POA: Diagnosis not present

## 2016-03-08 NOTE — Progress Notes (Signed)
6 Minute Walk Test Results  Patient: William MUSA [name] Date:  03/08/2016   Supplemental O2 during test? NO      Baseline   End  Time   1106    1112 Heartrate  70    81 Dyspnea  0    0 Fatigue  0                                     0 O2 sat   98%    99% Blood pressure 150/86            158/   Patient ambulated at a NORMAL pace for a total distance of 1176 feet with NO stops.  Ambulation was limited primarily due to N/A  Overall the test was tolerated VERY WELL with no complaints of any discomfort

## 2016-03-11 MED ORDER — SODIUM CHLORIDE 0.9 % IV SOLN: INTRAVENOUS | Status: DC

## 2016-03-11 MED ORDER — CHLORHEXIDINE GLUCONATE 4 % EX LIQD: 60.0000 mL | Freq: Once | CUTANEOUS | Status: DC

## 2016-03-11 MED ORDER — DEXTROSE 5 % IV SOLN
1.5000 g | INTRAVENOUS | Status: AC
  Administered 2016-03-12: 1.5 g via INTRAVENOUS
  Filled 2016-03-11: qty 1.5

## 2016-03-12 ENCOUNTER — Encounter (HOSPITAL_COMMUNITY): Payer: Self-pay | Admitting: *Deleted

## 2016-03-12 ENCOUNTER — Other Ambulatory Visit: Payer: Self-pay | Admitting: *Deleted

## 2016-03-12 ENCOUNTER — Inpatient Hospital Stay (HOSPITAL_COMMUNITY): Payer: BLUE CROSS/BLUE SHIELD

## 2016-03-12 ENCOUNTER — Encounter (HOSPITAL_COMMUNITY)
Admission: RE | Disposition: A | Discharge status: Home / Self Care | Payer: Self-pay | Source: Ambulatory Visit | Attending: Vascular Surgery

## 2016-03-12 ENCOUNTER — Inpatient Hospital Stay (HOSPITAL_COMMUNITY): Payer: BLUE CROSS/BLUE SHIELD | Admitting: Anesthesiology

## 2016-03-12 ENCOUNTER — Inpatient Hospital Stay (HOSPITAL_COMMUNITY)
Admission: RE | Admit: 2016-03-12 | Discharge: 2016-03-13 | DRG: 269 | Disposition: A | Discharge status: Home / Self Care | Payer: BLUE CROSS/BLUE SHIELD | Source: Ambulatory Visit | Attending: Vascular Surgery | Admitting: Vascular Surgery

## 2016-03-12 DIAGNOSIS — F17210 Nicotine dependence, cigarettes, uncomplicated: Secondary | ICD-10-CM | POA: Diagnosis present

## 2016-03-12 DIAGNOSIS — F1010 Alcohol abuse, uncomplicated: Secondary | ICD-10-CM | POA: Diagnosis present

## 2016-03-12 DIAGNOSIS — R636 Underweight: Secondary | ICD-10-CM | POA: Diagnosis present

## 2016-03-12 DIAGNOSIS — C3432 Malignant neoplasm of lower lobe, left bronchus or lung: Secondary | ICD-10-CM | POA: Diagnosis present

## 2016-03-12 DIAGNOSIS — I714 Abdominal aortic aneurysm, without rupture, unspecified: Secondary | ICD-10-CM | POA: Diagnosis present

## 2016-03-12 DIAGNOSIS — Z681 Body mass index (BMI) 19 or less, adult: Secondary | ICD-10-CM

## 2016-03-12 DIAGNOSIS — I340 Nonrheumatic mitral (valve) insufficiency: Secondary | ICD-10-CM | POA: Diagnosis not present

## 2016-03-12 DIAGNOSIS — G8929 Other chronic pain: Secondary | ICD-10-CM | POA: Diagnosis present

## 2016-03-12 DIAGNOSIS — Z9889 Other specified postprocedural states: Secondary | ICD-10-CM

## 2016-03-12 DIAGNOSIS — Z8679 Personal history of other diseases of the circulatory system: Secondary | ICD-10-CM

## 2016-03-12 DIAGNOSIS — I2584 Coronary atherosclerosis due to calcified coronary lesion: Secondary | ICD-10-CM | POA: Diagnosis not present

## 2016-03-12 DIAGNOSIS — M549 Dorsalgia, unspecified: Secondary | ICD-10-CM | POA: Diagnosis present

## 2016-03-12 DIAGNOSIS — R0609 Other forms of dyspnea: Secondary | ICD-10-CM | POA: Diagnosis not present

## 2016-03-12 DIAGNOSIS — I349 Nonrheumatic mitral valve disorder, unspecified: Secondary | ICD-10-CM | POA: Diagnosis not present

## 2016-03-12 DIAGNOSIS — I341 Nonrheumatic mitral (valve) prolapse: Secondary | ICD-10-CM | POA: Diagnosis present

## 2016-03-12 DIAGNOSIS — I2510 Atherosclerotic heart disease of native coronary artery without angina pectoris: Secondary | ICD-10-CM | POA: Diagnosis not present

## 2016-03-12 DIAGNOSIS — R918 Other nonspecific abnormal finding of lung field: Secondary | ICD-10-CM

## 2016-03-12 DIAGNOSIS — Z452 Encounter for adjustment and management of vascular access device: Secondary | ICD-10-CM | POA: Diagnosis not present

## 2016-03-12 HISTORY — PX: ABDOMINAL AORTIC ENDOVASCULAR STENT GRAFT: SHX5707

## 2016-03-12 LAB — BASIC METABOLIC PANEL
Anion gap: 5 (ref 5–15)
BUN: 6 mg/dL (ref 6–20)
CALCIUM: 8.5 mg/dL — AB (ref 8.9–10.3)
CO2: 24 mmol/L (ref 22–32)
CREATININE: 0.59 mg/dL — AB (ref 0.61–1.24)
Chloride: 108 mmol/L (ref 101–111)
GFR calc non Af Amer: >60 mL/min (ref >60)
Glucose, Bld: 107 mg/dL — ABNORMAL HIGH (ref 65–99)
Potassium: 4 mmol/L (ref 3.5–5.1)
SODIUM: 137 mmol/L (ref 135–145)

## 2016-03-12 LAB — PROTIME-INR
INR: 1.22
Prothrombin Time: 15.4 seconds — ABNORMAL HIGH (ref 11.4–15.2)

## 2016-03-12 LAB — CBC
HCT: 38.2 % — ABNORMAL LOW (ref 39.0–52.0)
Hemoglobin: 12.9 g/dL — ABNORMAL LOW (ref 13.0–17.0)
MCH: 31.2 pg (ref 26.0–34.0)
MCHC: 33.8 g/dL (ref 30.0–36.0)
MCV: 92.3 fL (ref 78.0–100.0)
Platelets: 280 10*3/uL (ref 150–400)
RBC: 4.14 MIL/uL — ABNORMAL LOW (ref 4.22–5.81)
RDW: 13.9 % (ref 11.5–15.5)
WBC: 8.4 10*3/uL (ref 4.0–10.5)

## 2016-03-12 LAB — ECHOCARDIOGRAM COMPLETE
HEIGHTINCHES: 75 in
Weight: 2391.55 oz

## 2016-03-12 LAB — ANTITHROMBIN III: AntiThromb III Func: 88 % (ref 75–120)

## 2016-03-12 LAB — APTT: aPTT: 34 seconds (ref 24–36)

## 2016-03-12 LAB — MAGNESIUM: MAGNESIUM: 1.6 mg/dL — AB (ref 1.7–2.4)

## 2016-03-12 SURGERY — INSERTION, ENDOVASCULAR STENT GRAFT, AORTA, ABDOMINAL
Anesthesia: General | Site: Groin

## 2016-03-12 MED ORDER — MIDAZOLAM HCL 5 MG/5ML IJ SOLN
INTRAMUSCULAR | Status: DC | PRN
  Administered 2016-03-12: 2 mg via INTRAVENOUS

## 2016-03-12 MED ORDER — LACTATED RINGERS IV SOLN
INTRAVENOUS | Status: DC | PRN
  Administered 2016-03-12 (×2): via INTRAVENOUS

## 2016-03-12 MED ORDER — SUGAMMADEX SODIUM 200 MG/2ML IV SOLN
INTRAVENOUS | Status: AC
  Filled 2016-03-12: qty 2

## 2016-03-12 MED ORDER — PROTAMINE SULFATE 10 MG/ML IV SOLN
INTRAVENOUS | Status: AC
  Filled 2016-03-12: qty 5

## 2016-03-12 MED ORDER — ONDANSETRON HCL 4 MG/2ML IJ SOLN: 4.0000 mg | Freq: Once | INTRAMUSCULAR | Status: DC | PRN

## 2016-03-12 MED ORDER — LACTATED RINGERS IV SOLN
INTRAVENOUS | Status: DC | PRN
  Administered 2016-03-12: 07:00:00 via INTRAVENOUS

## 2016-03-12 MED ORDER — FENTANYL CITRATE (PF) 100 MCG/2ML IJ SOLN
INTRAMUSCULAR | Status: AC
  Filled 2016-03-12: qty 2

## 2016-03-12 MED ORDER — OXYCODONE HCL 5 MG PO TABS
5.0000 mg | ORAL_TABLET | ORAL | Status: DC | PRN
  Administered 2016-03-12: 10 mg via ORAL
  Filled 2016-03-12: qty 2

## 2016-03-12 MED ORDER — ACETAMINOPHEN 325 MG PO TABS: 325.0000 mg | ORAL_TABLET | ORAL | Status: DC | PRN

## 2016-03-12 MED ORDER — ROCURONIUM BROMIDE 100 MG/10ML IV SOLN
INTRAVENOUS | Status: DC | PRN
  Administered 2016-03-12: 50 mg via INTRAVENOUS

## 2016-03-12 MED ORDER — FENTANYL CITRATE (PF) 100 MCG/2ML IJ SOLN
25.0000 ug | INTRAMUSCULAR | Status: DC | PRN
  Administered 2016-03-12 (×2): 50 ug via INTRAVENOUS

## 2016-03-12 MED ORDER — FENTANYL CITRATE (PF) 250 MCG/5ML IJ SOLN
INTRAMUSCULAR | Status: AC
  Filled 2016-03-12: qty 5

## 2016-03-12 MED ORDER — POTASSIUM CHLORIDE CRYS ER 20 MEQ PO TBCR: 20.0000 meq | EXTENDED_RELEASE_TABLET | Freq: Every day | ORAL | Status: DC | PRN

## 2016-03-12 MED ORDER — PROPOFOL 10 MG/ML IV BOLUS
INTRAVENOUS | Status: DC | PRN
  Administered 2016-03-12: 30 mg via INTRAVENOUS
  Administered 2016-03-12: 120 mg via INTRAVENOUS

## 2016-03-12 MED ORDER — FENTANYL CITRATE (PF) 100 MCG/2ML IJ SOLN
INTRAMUSCULAR | Status: DC | PRN
  Administered 2016-03-12 (×3): 50 ug via INTRAVENOUS
  Administered 2016-03-12: 100 ug via INTRAVENOUS

## 2016-03-12 MED ORDER — MAGNESIUM HYDROXIDE 400 MG/5ML PO SUSP: 30.0000 mL | Freq: Every day | ORAL | Status: DC | PRN

## 2016-03-12 MED ORDER — SODIUM CHLORIDE 0.9 % IV SOLN: INTRAVENOUS | Status: DC

## 2016-03-12 MED ORDER — ONDANSETRON HCL 4 MG/2ML IJ SOLN: 4.0000 mg | Freq: Four times a day (QID) | INTRAMUSCULAR | Status: DC | PRN

## 2016-03-12 MED ORDER — GUAIFENESIN-DM 100-10 MG/5ML PO SYRP: 15.0000 mL | ORAL_SOLUTION | ORAL | Status: DC | PRN

## 2016-03-12 MED ORDER — BISACODYL 10 MG RE SUPP: 10.0000 mg | Freq: Every day | RECTAL | Status: DC | PRN

## 2016-03-12 MED ORDER — SUGAMMADEX SODIUM 200 MG/2ML IV SOLN
INTRAVENOUS | Status: DC | PRN
  Administered 2016-03-12: 150 mg via INTRAVENOUS

## 2016-03-12 MED ORDER — PROTAMINE SULFATE 10 MG/ML IV SOLN
INTRAVENOUS | Status: DC | PRN
  Administered 2016-03-12: 40 mg via INTRAVENOUS

## 2016-03-12 MED ORDER — ACETAMINOPHEN 325 MG RE SUPP: 325.0000 mg | RECTAL | Status: DC | PRN

## 2016-03-12 MED ORDER — 0.9 % SODIUM CHLORIDE (POUR BTL) OPTIME
TOPICAL | Status: DC | PRN
Start: 2016-03-12 — End: 2016-03-12
  Administered 2016-03-12: 1000 mL

## 2016-03-12 MED ORDER — ONDANSETRON HCL 4 MG/2ML IJ SOLN
INTRAMUSCULAR | Status: AC
  Filled 2016-03-12: qty 2

## 2016-03-12 MED ORDER — DOCUSATE SODIUM 100 MG PO CAPS
100.0000 mg | ORAL_CAPSULE | Freq: Every day | ORAL | Status: DC
  Administered 2016-03-13: 100 mg via ORAL
  Filled 2016-03-12: qty 1

## 2016-03-12 MED ORDER — SODIUM CHLORIDE 0.9 % IV SOLN
INTRAVENOUS | Status: DC | PRN
  Administered 2016-03-12: 500 mL

## 2016-03-12 MED ORDER — PROPOFOL 10 MG/ML IV BOLUS
INTRAVENOUS | Status: AC
  Filled 2016-03-12: qty 20

## 2016-03-12 MED ORDER — MIDAZOLAM HCL 2 MG/2ML IJ SOLN
INTRAMUSCULAR | Status: AC
  Filled 2016-03-12: qty 2

## 2016-03-12 MED ORDER — SODIUM CHLORIDE 0.9 % IV SOLN: 500.0000 mL | Freq: Once | INTRAVENOUS | Status: DC | PRN

## 2016-03-12 MED ORDER — PANTOPRAZOLE SODIUM 40 MG PO TBEC
40.0000 mg | DELAYED_RELEASE_TABLET | Freq: Every day | ORAL | Status: DC
  Administered 2016-03-13: 40 mg via ORAL
  Filled 2016-03-12: qty 1

## 2016-03-12 MED ORDER — CEFUROXIME SODIUM 1.5 G IJ SOLR
1.5000 g | Freq: Two times a day (BID) | INTRAMUSCULAR | Status: AC
Start: 2016-03-12 — End: 2016-03-13
  Administered 2016-03-12 – 2016-03-13 (×2): 1.5 g via INTRAVENOUS
  Filled 2016-03-12 (×2): qty 1.5

## 2016-03-12 MED ORDER — METOPROLOL TARTRATE 5 MG/5ML IV SOLN: 2.0000 mg | INTRAVENOUS | Status: DC | PRN

## 2016-03-12 MED ORDER — MAGNESIUM SULFATE 2 GM/50ML IV SOLN
2.0000 g | Freq: Every day | INTRAVENOUS | Status: AC | PRN
  Administered 2016-03-13: 2 g via INTRAVENOUS
  Filled 2016-03-12: qty 50

## 2016-03-12 MED ORDER — HEPARIN SODIUM (PORCINE) 1000 UNIT/ML IJ SOLN
INTRAMUSCULAR | Status: AC
  Filled 2016-03-12: qty 1

## 2016-03-12 MED ORDER — FENTANYL CITRATE (PF) 100 MCG/2ML IJ SOLN
25.0000 ug | INTRAMUSCULAR | Status: DC | PRN
  Administered 2016-03-12 (×2): 50 ug via INTRAVENOUS
  Filled 2016-03-12 (×2): qty 2

## 2016-03-12 MED ORDER — PHENYLEPHRINE HCL 10 MG/ML IJ SOLN
INTRAVENOUS | Status: DC | PRN
  Administered 2016-03-12: 20 ug/min via INTRAVENOUS

## 2016-03-12 MED ORDER — ENOXAPARIN SODIUM 40 MG/0.4ML ~~LOC~~ SOLN
40.0000 mg | SUBCUTANEOUS | Status: DC
  Administered 2016-03-13: 40 mg via SUBCUTANEOUS
  Filled 2016-03-12: qty 0.4

## 2016-03-12 MED ORDER — MORPHINE SULFATE (PF) 2 MG/ML IV SOLN
2.0000 mg | INTRAVENOUS | Status: DC | PRN
  Administered 2016-03-12: 2 mg via INTRAVENOUS
  Filled 2016-03-12: qty 1

## 2016-03-12 MED ORDER — HEPARIN SODIUM (PORCINE) 1000 UNIT/ML IJ SOLN
INTRAMUSCULAR | Status: DC | PRN
  Administered 2016-03-12: 7000 [IU] via INTRAVENOUS

## 2016-03-12 MED ORDER — IODIXANOL 320 MG/ML IV SOLN
INTRAVENOUS | Status: DC | PRN
  Administered 2016-03-12: 80.9 mL via INTRAVENOUS

## 2016-03-12 MED ORDER — ALUM & MAG HYDROXIDE-SIMETH 200-200-20 MG/5ML PO SUSP: 15.0000 mL | ORAL | Status: DC | PRN

## 2016-03-12 MED ORDER — HYDRALAZINE HCL 20 MG/ML IJ SOLN: 5.0000 mg | INTRAMUSCULAR | Status: DC | PRN

## 2016-03-12 MED ORDER — LABETALOL HCL 5 MG/ML IV SOLN
10.0000 mg | INTRAVENOUS | Status: DC | PRN
Start: 2016-03-12 — End: 2016-03-13

## 2016-03-12 MED ORDER — MORPHINE SULFATE (PF) 2 MG/ML IV SOLN: 2.0000 mg | INTRAVENOUS | Status: DC | PRN

## 2016-03-12 MED ORDER — ONDANSETRON HCL 4 MG/2ML IJ SOLN
INTRAMUSCULAR | Status: DC | PRN
  Administered 2016-03-12: 4 mg via INTRAVENOUS

## 2016-03-12 MED ORDER — PHENOL 1.4 % MT LIQD: 1.0000 | OROMUCOSAL | Status: DC | PRN

## 2016-03-12 SURGICAL SUPPLY — 53 items
CANISTER SUCTION 2500CC (MISCELLANEOUS) ×2 IMPLANT
CATH ANGIO 5F BER2 65CM (CATHETERS) ×2 IMPLANT
CATH BEACON 5.038 65CM KMP-01 (CATHETERS) IMPLANT
CATH OMNI FLUSH .035X70CM (CATHETERS) ×2 IMPLANT
COVER PROBE W GEL 5X96 (DRAPES) IMPLANT
DERMABOND ADVANCED (GAUZE/BANDAGES/DRESSINGS) ×1
DERMABOND ADVANCED .7 DNX12 (GAUZE/BANDAGES/DRESSINGS) ×1 IMPLANT
DEVICE CLOSURE PERCLS PRGLD 6F (VASCULAR PRODUCTS) ×4 IMPLANT
DRSG TEGADERM 2-3/8X2-3/4 SM (GAUZE/BANDAGES/DRESSINGS) ×4 IMPLANT
DRYSEAL FLEXSHEATH 12FR 33CM (SHEATH) ×1
DRYSEAL FLEXSHEATH 18FR 33CM (SHEATH) ×1
ELECT REM PT RETURN 9FT ADLT (ELECTROSURGICAL) ×4
ELECTRODE REM PT RTRN 9FT ADLT (ELECTROSURGICAL) ×2 IMPLANT
EXCLUDER TNK LEG 31MX14X15 (Endovascular Graft) ×1 IMPLANT
EXCLUDER TRUNK LEG 31MX14X15 (Endovascular Graft) ×2 IMPLANT
GAUZE SPONGE 2X2 8PLY STRL LF (GAUZE/BANDAGES/DRESSINGS) ×1 IMPLANT
GLOVE BIO SURGEON STRL SZ7 (GLOVE) ×2 IMPLANT
GLOVE BIO SURGEON STRL SZ7.5 (GLOVE) ×2 IMPLANT
GLOVE BIOGEL PI IND STRL 7.5 (GLOVE) ×1 IMPLANT
GLOVE BIOGEL PI IND STRL 8 (GLOVE) ×1 IMPLANT
GLOVE BIOGEL PI INDICATOR 7.5 (GLOVE) ×1
GLOVE BIOGEL PI INDICATOR 8 (GLOVE) ×1
GOWN STRL REUS W/ TWL LRG LVL3 (GOWN DISPOSABLE) ×3 IMPLANT
GOWN STRL REUS W/TWL LRG LVL3 (GOWN DISPOSABLE) ×3
GRAFT BALLN CATH 65CM (STENTS) ×1 IMPLANT
KIT BASIN OR (CUSTOM PROCEDURE TRAY) ×2 IMPLANT
KIT ROOM TURNOVER OR (KITS) ×2 IMPLANT
LEG CONTRALATERAL 16X14.5X12 (Vascular Products) ×2 IMPLANT
NEEDLE PERC 18GX7CM (NEEDLE) ×2 IMPLANT
NS IRRIG 1000ML POUR BTL (IV SOLUTION) ×2 IMPLANT
PACK ENDOVASCULAR (PACKS) ×2 IMPLANT
PAD ARMBOARD 7.5X6 YLW CONV (MISCELLANEOUS) ×4 IMPLANT
PERCLOSE PROGLIDE 6F (VASCULAR PRODUCTS) ×8
SET MICROPUNCTURE 5F STIFF (MISCELLANEOUS) ×2 IMPLANT
SHEATH AVANTI 11CM 8FR (MISCELLANEOUS) ×2 IMPLANT
SHEATH BRITE TIP 8FR 23CM (MISCELLANEOUS) ×2 IMPLANT
SHEATH DRYSEAL FLEX 12FR 33CM (SHEATH) ×1 IMPLANT
SHEATH DRYSEAL FLEX 18FR 33CM (SHEATH) ×1 IMPLANT
SPONGE GAUZE 2X2 STER 10/PKG (GAUZE/BANDAGES/DRESSINGS) ×1
STAPLER VISISTAT 35W (STAPLE) IMPLANT
STENT GRAFT BALLN CATH 65CM (STENTS) ×1
STOPCOCK MORSE 400PSI 3WAY (MISCELLANEOUS) ×2 IMPLANT
SUT ETHILON 3 0 PS 1 (SUTURE) IMPLANT
SUT MNCRL AB 4-0 PS2 18 (SUTURE) ×4 IMPLANT
SUT PROLENE 5 0 C 1 24 (SUTURE) IMPLANT
SUT VIC AB 2-0 CTX 36 (SUTURE) IMPLANT
SUT VIC AB 3-0 SH 27 (SUTURE) ×1
SUT VIC AB 3-0 SH 27X BRD (SUTURE) ×1 IMPLANT
SYR 30ML LL (SYRINGE) ×2 IMPLANT
TRAY FOLEY W/METER SILVER 16FR (SET/KITS/TRAYS/PACK) ×2 IMPLANT
TUBING HIGH PRESSURE 120CM (CONNECTOR) ×2 IMPLANT
WIRE AMPLATZ SS-J .035X180CM (WIRE) ×4 IMPLANT
WIRE BENTSON .035X145CM (WIRE) ×2 IMPLANT

## 2016-03-12 NOTE — Anesthesia Procedure Notes (Deleted)
Procedures

## 2016-03-12 NOTE — Anesthesia Postprocedure Evaluation (Signed)
Anesthesia Post Note  Patient: William DUCAT [name]  Procedure(s) Performed: Procedure(s) (LRB): ABDOMINAL AORTIC ENDOVASCULAR STENT GRAFT (N/A)  Patient location during evaluation: PACU Anesthesia Type: General Level of consciousness: awake, awake and alert and oriented Pain management: pain level controlled Vital Signs Assessment: post-procedure vital signs reviewed and stable Respiratory status: spontaneous breathing, nonlabored ventilation and respiratory function stable Cardiovascular status: blood pressure returned to baseline Anesthetic complications: no    Last Vitals:  Vitals:   03/12/16 1136 03/12/16 1427  BP: 120/78 130/84  Pulse: (!) 55 63  Resp: 16 13  Temp: 36.3 C 36.7 C    Last Pain:  Vitals:   03/12/16 1427  TempSrc: Oral  PainSc:                  [name]

## 2016-03-12 NOTE — Anesthesia Procedure Notes (Signed)
Procedure Name: Intubation Date/Time: 03/12/2016 7:46 AM Performed by: Izora Gala Pre-anesthesia Checklist: Patient identified, Emergency Drugs available, Suction available and Patient being monitored Patient Re-evaluated:Patient Re-evaluated prior to inductionOxygen Delivery Method: Circle system utilized Preoxygenation: Pre-oxygenation with 100% oxygen Intubation Type: IV induction Ventilation: Mask ventilation without difficulty and Oral airway inserted - appropriate to patient size Laryngoscope Size: Miller and 3 Grade View: Grade I Tube type: Oral Tube size: 7.5 mm Number of attempts: 1 Airway Equipment and Method: Stylet and LTA kit utilized Placement Confirmation: ETT inserted through vocal cords under direct vision,  positive ETCO2 and breath sounds checked- equal and bilateral Secured at: 22 cm Tube secured with: Tape Dental Injury: Teeth and Oropharynx as per pre-operative assessment

## 2016-03-12 NOTE — Anesthesia Procedure Notes (Addendum)
Central Venous Catheter Insertion Performed by: anesthesiologist 03/12/2016 7:03 AM Patient location: Pre-op. Preanesthetic checklist: patient identified, IV checked, site marked, risks and benefits discussed, surgical consent, monitors and equipment checked, pre-op evaluation, timeout performed and anesthesia consent Position: Trendelenburg Lidocaine 1% used for infiltration Landmarks identified and Seldinger technique used Catheter size: 8 Fr Central line was placed.Double lumen Procedure performed using ultrasound guided technique. Attempts: 1 Following insertion, line sutured, dressing applied and Biopatch. Post procedure assessment: blood return through all ports, free fluid flow and no air. Patient tolerated the procedure well with no immediate complications.

## 2016-03-12 NOTE — Transfer of Care (Signed)
Immediate Anesthesia Transfer of Care Note  Patient: [name].  Procedure(s) Performed: Procedure(s): ABDOMINAL AORTIC ENDOVASCULAR STENT GRAFT (N/A)  Patient Location: PACU  Anesthesia Type:General  Level of Consciousness: awake, alert , oriented and patient cooperative  Airway & Oxygen Therapy: Patient Spontanous Breathing and Patient connected to nasal cannula oxygen  Post-op Assessment: Report given to RN, Post -op Vital signs reviewed and stable, Patient moving all extremities and Patient moving all extremities X 4  Post vital signs: Reviewed and stable  Last Vitals:  Vitals:   03/12/16 0605  BP: (!) 160/90  Pulse: 65  Resp: 20  Temp: 36.7 C    Last Pain:  Vitals:   03/12/16 0605  TempSrc: Oral      Patients Stated Pain Goal: 10 (78/93/81 0175)  Complications: No apparent anesthesia complications

## 2016-03-12 NOTE — Interval H&P Note (Signed)
History and Physical Interval Note:  03/12/2016 6:58 AM  William Kales [name]  has presented today for surgery, with the diagnosis of Abdominal Aortic Aneurysm I71.4  The various methods of treatment have been discussed with the patient and family. After consideration of risks, benefits and other options for treatment, the patient has consented to  Procedure(s): ABDOMINAL AORTIC ENDOVASCULAR STENT GRAFT (N/A) as a surgical intervention .  The patient's history has been reviewed, patient examined, no change in status, stable for surgery.  I have reviewed the patient's chart and labs.  Questions were answered to the patient's satisfaction.     Adele Barthel

## 2016-03-12 NOTE — Anesthesia Preprocedure Evaluation (Signed)
Anesthesia Evaluation  Patient identified by MRN, date of birth, ID band Patient awake    Reviewed: Allergy & Precautions, NPO status , Patient's Chart, lab work & pertinent test results  Airway Mallampati: II  TM Distance: >3 FB Neck ROM: Full    Dental  (+) Edentulous Upper, Edentulous Lower   Pulmonary former smoker,    breath sounds clear to auscultation       Cardiovascular  Rhythm:Regular Rate:Normal     Neuro/Psych    GI/Hepatic   Endo/Other    Renal/GU      Musculoskeletal   Abdominal   Peds  Hematology   Anesthesia Other Findings   Reproductive/Obstetrics                             Anesthesia Physical Anesthesia Plan  ASA: III  Anesthesia Plan: General   Post-op Pain Management:    Induction: Intravenous  Airway Management Planned: Oral ETT  Additional Equipment: CVP and Arterial line  Intra-op Plan:   Post-operative Plan: Extubation in OR  Informed Consent: I have reviewed the patients History and Physical, chart, labs and discussed the procedure including the risks, benefits and alternatives for the proposed anesthesia with the patient or authorized representative who has indicated his/her understanding and acceptance.     Plan Discussed with: CRNA and Anesthesiologist  Anesthesia Plan Comments:         Anesthesia Quick Evaluation

## 2016-03-12 NOTE — Op Note (Signed)
    NAME: William Kales [name]   MRN: [id number] DOB: 31-May-1955    DATE OF OPERATION: 03/12/2016  PREOP DIAGNOSIS: 6.2 cm infrarenal abdominal aortic aneurysm  POSTOP DIAGNOSIS: Same  PROCEDURE:  1. Pre-closure left common femoral artery with 2 Perclose devices 2.  Introduction of left iliac limb (78m X 12 cm Gore)  CO-SURGEON: CJudeth Cornfield DScot Dock MD, BAdele Barthel MD  ANESTHESIA: Gen.  EBL:Per anesthesia record  INDICATIONS: GRAKWON LETOURNEAUSr. is a 60y.o. male who presents for elective repair of a 6.2 cm infrarenal AAA.  FINDINGS: Completion arteriogram showed the graft in good position with no endo-leaks identified.  TECHNIQUE: The patient was taken to the operating room and received a general anesthetic. Monitoring lines had been placed by anesthesia. The abdomen and groins were prepped and draped in usual sterile fashion.  On the left side, under ultrasound guidance, the left common femoral artery was cannulated and a Benson wire introduced under fluoroscopic control. This artery was pre-closed using 2 Perclose devices. The initial device was rotated 10 medially. The second device was rotated 10 laterally. A long 8 French sheath was placed on the left side. The 8 French sheath was exchanged for a 12 French sheath over an Amplatz wire. The pigtail catheter was positioned at the L2 vertebral body after the 18 French sheath was placed from the right side.  After the trunk ipsilateral component was deployed proximally, the contralateral gate was cannulated using a buddy wire technique. With the Amplatz wire in place,  the gate was cannulated using a Berenstein 2 catheter and a BCareers adviser This catheter was then exchanged for the Omni Flush catheter which was returned within the graft to be sure that we were within the graft. Next the Amplatz wire was repositioned through the contralateral gate using a catheter which was left in place to obtain a retrograde left iliac shot to demonstrate  the position of the hypogastric artery on the left. A retrograde left iliac shot was obtained. Of note the left internal iliac artery was occluded.  A 14 mm x 12 cm iliac limb was selected and advanced over the Amplatz wire into the contralateral gate and then deployed without difficulty.  At the completion the Q 50 balloon was used to balloon the proximal graft aortic bifurcation and distal graft. Completion arteriogram showed no evidence of type I or type II endoleak's.  Next the 12 French sheath was removed and the left groin pressure held for hemostasis. The 2 previously placed Perclose devices were then secured and was good hemostasis. The skin was closed with a 40 septic or stitch.  CDeitra Mayo MD, FACS [facility] DATE OF DICTATION:   03/12/2016

## 2016-03-12 NOTE — Care Management Note (Signed)
Case Management Note  Patient Details  Name: William PINHEIRO [name] MRN: [id number] Date of Birth: 1956/02/09  Subjective/Objective:   S/p EVAR repair, lives with wife, pta indep, has pcp, has medication coverage and transportation at discharge.  NCM will cont to follow for dc needs.                  Action/Plan:   Expected Discharge Date:                  Expected Discharge Plan:  Home/Self Care  In-House Referral:     Discharge planning Services  CM Consult  Post Acute Care Choice:    Choice offered to:     DME Arranged:    DME Agency:     HH Arranged:    HH Agency:     Status of Service:  In process, will continue to follow  If discussed at Long Length of Stay Meetings, dates discussed:    Additional Comments:  Zenon Mayo, RN 03/12/2016, 4:06 PM

## 2016-03-12 NOTE — H&P (View-Only) (Signed)
Established Abdominal Aortic Aneurysm  History of Present Illness  The patient is a 60 y.o. (Oct 01, 1955) male who presents with chief complaint: follow up for AAA.  The patient was seen just Wednesday.  He returns from his CTA abd/pelvis today.  He has had no back or abd pain.  He is scheduled to see Cardiology on 7 NOV 17.   Past Medical History:  Diagnosis Date  . AAA (abdominal aortic aneurysm) ([place])   . HNP (herniated nucleus pulposus), lumbar    L4 with radiculopathy    Past Surgical History:  Procedure Laterality Date  . INGUINAL HERNIA REPAIR Left 1975   Left inguinal hernia  . LUMBAR LAMINECTOMY  October 22, 2012    Social History   Social History  . Marital status: Married    Spouse name: N/A  . Number of children: N/A  . Years of education: N/A   Occupational History  . Not on file.   Social History Main Topics  . Smoking status: Current Every Day Smoker    Packs/day: 1.00    Types: Cigarettes  . Smokeless tobacco: Never Used  . Alcohol use 7.2 oz/week    12 Cans of beer per week  . 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 Sexual activity: Not on file   Other Topics Concern  . Not on file   Social History Narrative  . No narrative on file    Family History  Problem Relation Age of Onset  . Diabetes Mother     Current Outpatient Prescriptions  Medication Sig Dispense Refill  . ibuprofen (ADVIL,MOTRIN) 200 MG tablet Take 200 mg by mouth every 6 (six) hours as needed for pain.    . traMADol (ULTRAM) 50 MG tablet      No current facility-administered medications for this visit.      Allergies  Allergen Reactions  . Tramadol Nausea And Vomiting     REVIEW OF SYSTEMS:  (Positives checked otherwise negative)  CARDIOVASCULAR:   '[ ]'$  chest pain,  '[ ]'$  chest pressure,  '[ ]'$  palpitations,  '[ ]'$  shortness of breath when laying flat,  '[ ]'$  shortness of breath with exertion,   '[ ]'$  pain in feet when walking,    '[ ]'$  pain in feet when laying flat, '[ ]'$  history of blood clot in veins (DVT),  '[ ]'$  history of phlebitis,  '[ ]'$  swelling in legs,  '[ ]'$  varicose veins  PULMONARY:   '[ ]'$  productive cough,  '[ ]'$  asthma,  '[ ]'$  wheezing  NEUROLOGIC:   '[ ]'$  weakness in arms or legs,  '[ ]'$  numbness in arms or legs,  '[ ]'$  difficulty speaking or slurred speech,  '[ ]'$  temporary loss of vision in one eye,  '[ ]'$  dizziness  HEMATOLOGIC:   '[ ]'$  bleeding problems,  '[ ]'$  problems with blood clotting too easily  MUSCULOSKEL:   '[ ]'$  joint pain, '[ ]'$  joint swelling  GASTROINTEST:   '[ ]'$  vomiting blood,  '[ ]'$  blood in stool     GENITOURINARY:   '[ ]'$  burning with urination,  '[ ]'$  blood in urine  PSYCHIATRIC:   '[ ]'$  history of major depression  INTEGUMENTARY:   '[ ]'$  rashes,  '[ ]'$  ulcers  CONSTITUTIONAL:   '[ ]'$  fever,  '[ ]'$  chills   Physical Examination  Vitals:   02/24/16 1416  BP: (!) 105/58  Pulse: 88  Temp: 97.2 F (36.2 C)  TempSrc: Oral  SpO2: 96%  Weight: 145 lb 4.8 oz (65.9 kg)  Height: '6\' 4"'$  (1.93 m)   Body mass index is 17.69 kg/m.  General: A&O x 3, WD, thin  Pulmonary: Sym exp, good air movt, CTAB, no rales, rhonchi, & wheezing   Cardiac: RRR, Nl S1, S2, no Murmurs, rubs or gallops  Vascular: Vessel Right Left  Radial Palpable Palpable  Brachial Palpable Palpable  Carotid Palpable, without bruit Palpable, without bruit  Aorta Not palpable N/A  Femoral Palpable Palpable  Popliteal Not palpable Not palpable  PT Palpable Palpable  DP Palpable Palpable   Gastrointestinal: soft, NTND, no G/R, no HSM, no masses, no CVAT B  Musculoskeletal: M/S 5/5 throughout , Extremities without ischemic changes   Neurologic:  Pain and light touch intact in extremities, Motor exam as listed above  Non-Invasive Vascular Imaging  CTA abd/pelvis (02/24/2016) 1. Fusiform abdominal aortic aneurysm measuring 6.2 x 5.9 cm as detailed above. Aortic aneurysm NOS (ICD10-I71.9) 2. 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 The pain lumen still measures 6 mm at this location. 3.  Aortic Atherosclerosis (ICD10-170.0). 4. Ectatic but non aneurysmal bilateral common iliac arteries.  I reviewed this patient's CTA, he has an infrarenal AAA with adequate neck for an EVAR.  His access vessels appear adequate though both iliac arterial systems may have some resistance.   Medical Decision Making  The patient is a 60 y.o. male who presents with: asymptomatic large AAA    Based on this patient's exam and diagnostic studies, he needs EVAR. The patient is aware the risks of endovascular aortic surgery include but are not limited to: bleeding, need for transfusion, infection, death, stroke, paralysis, wound complications, spinal, pelvic and bowel ischemia, extended ventilation, anaphylactic reaction to contrast, contrast induced nephropathy, embolism, and need for additional procedure to address endoleaks.   Overall, I cited a mortality rate of 1-2% and morbidity rate of 15%.  I have him tenatively schedule on 16 NOV 17 with Dr. Scot Dock.  Thank you for allowing [place] to participate in this patient's care.   Adele Barthel, MD, FACS [facility] [place] Office: [phone] Pager: [phone]

## 2016-03-12 NOTE — Progress Notes (Signed)
  [facility] Day of Surgery Note  Subjective:  Having back pain that is chronic. Says lying on his back for too long is uncomfortable.   Vitals:   03/12/16 1136 03/12/16 1427  BP: 120/78 130/84  Pulse: (!) 55 63  Resp: 16 13  Temp: 97.3 F (36.3 C) 98.1 F (36.7 C)   Groins soft without hematoma.  Palpable DP pulses bilaterally  Assessment/Plan:  This is a 60 y.o. male who is s/p EVAR   Doing well post-op. Back pain is positional. No new back pain. Can mobilize and get out of bed.     Virgina Jock, [place] Pager: [phone] 03/12/2016 4:05 PM

## 2016-03-12 NOTE — Progress Notes (Signed)
  Echocardiogram 2D Echocardiogram has been performed.  Donata Clay 03/12/2016, 2:06 PM

## 2016-03-12 NOTE — Op Note (Addendum)
OPERATIVE NOTE   PROCEDURE: 1. Right common femoral artery cannulation under ultrasound guidance 2. "Preclose" repair of right common femoral artery  3. Placement of catheter in aorta  4. Aortogram 5. Placement of bifurcated aortic endograft (61m x 14 mm x 15cm)  6. Radiologic S&I  (Dictated by Dr. DScot Dock 7. Placement of left iliac limb (14 mm x 12 cm) 8. Left common femoral artery cannulation under ultrasound guidance 9. "Preclose" repair of left common femoral artery  10. Placement of catheter in aorta    PRE-OPERATIVE DIAGNOSIS: asymptomatic large abdominal aortic aneurysm,  POST-OPERATIVE DIAGNOSIS: same as above   CO-SURGEONS: BAdele Barthel MD; CGae Gallop MD  ANESTHESIA: general  ESTIMATED BLOOD LOSS: 50 cc  FINDING(S): 1. 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  Successful exclusion of abdominal aortic aneurysm with patient left renal artery 3. Patent right internal iliac artery with interval occluded left internal iliac artery  4.  No endoleak  5.  Doppler pedal signals in both feet  SPECIMEN(S): none  INDICATIONS:  GMARX DOIGSr. is a 60y.o. ([birth year] male who presents with large asymptomatic abdominal aortic aneurysm. The patient is aware the risks of endovascular aortic surgery include but are not limited to: bleeding, need for transfusion, infection, death, stroke, paralysis, wound complications, bowel ischemia, pelvic ischemia, extended ventilation, anaphylactic reaction to contrast, contrast induced nephropathy, embolism, and need for additional procedure to address endoleaks. Overall, I cited a mortality rate of 1-2% and morbidity rate of 15%.  DESCRIPTION: After obtaining full informed written consent, the patient was brought back to the operating room and placed supine upon the operating table. The patient received IV antibiotics prior to induction. After obtaining adequate anesthesia,  the patient was prepped and draped in the standard fashion for: open and endovascular aortic repair.  At this point, I turned my attention to the right groin. Under Sonosite guidance, I cannulated the right common femoral artery with a 18 gauge needle. The wire would not pass despite multiple manuevers, so I pulled the wire and needle and held pressure for 10 minutes.  Meanwhile, Dr. DScot Dockcannulated the left common femoral artery under Sonosite guidance and performed the "Preclose" technique on this side.  I long 8-Fr sheath was loaded into the aorta.  At this point, I cannulated the right common femoral artery above some posterior plaque under Sonosite guidance.  I passed a microwire into the right iliac arterial system.  The needle was exchanged for the microsheath which was loaded into the right common femoral artery.  I removed the dilator and exchanged the microwire for a Bentson wire which was loaded into the distal aorta.  The subcutaneous tract was dilated with a 8-Fr dilator. The first Proglide device was loaded over the wire into the artery, the wire was removed and the device advanced until a flash of blood was obtained through the side port. I deployed the device with 30 degrees medial rotation. The Proglide sutures were removed and tagged. The Bentson wire was reloaded through the used Proglide device. I loaded another Proglide over the wire into the right common iliac artery artery. The device was deployed in a similar fashion, this time at 30 degree lateral rotation. A 8-Fr sheath was loaded into the iliac arterial system under fluoroscopic guidance. I placed a BER-2 catheter over this wire and exchanged the wire for an Amplatz wire.  The patient was given 7000 units of Heparin intravenously, which was a therapeutic bolus.  At this point, Dr. Scot Dock placed a pigtail catheter over the left wire in the suprarenal segment of the aorta.  The wire was exchanged for an Amplatz  wire which was advanced into the descending thoracic aorta.  The left sheath was exchanged for a 12-Fr Dryseal sheath which was advanced into the distal aorta.    I then exchanged the right sheath for a 18-Fr sheath which was advanced into the infrarenal aorta.  I then loaded the main body through the left sheath: C3 19m x 14 mm x 15cm in the infrarenal position. A power injector aortogram was completed to identify the location of the left renal artery which was the lowest renal artery. I could not see the right renal artery on this predeployment injection.  I deployed the main body main body in the immediate infrarenal position. I repositioned the pigtail catheter more proximally and did a completion aortogram: good positioning of the main body in the immediate infrarenal position, patent proximal right renal artery with distal occlusion.  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    At this point, Dr. DScot Dockreplaced the Amplatz in the pigtail catheter and removed the catheter. He then placed a buddy wire, Bentson wire through the left femoral sheath with a cheater. He cannulated the contralateral gate with a BER-2 catheter and Bentson wire.  The catheter was advanced into the suprarenal aorta.  The wire was exchanged for the Amplatz wire.  The catheter was exchanged for a pigtail catheter. The wire was pulled down and the pigtail catheter was rotated at the top of the endograft to demonstrated intragraft position of the catheter. The Amplatz wire was replaced in this catheter and the catheter pulled down to the flow divider.   A retrograde injection from the left femoral sheath demonstrated the left internal iliac artery was also occluded.  Based on the length measurements, 119mx 12cm contralateral limb was selected. Dr.  DiScot Dockositioned this limb extension with adequate overlap and deployed the limb. The stent delivery device was removed.   At this point, I performed a retrograde injection on the right side to confirm of the right internal iliac artery position. There appeared to be adequate common iliac artery length to seal this graft.  I fully released the ipsilateral limb while gentle pushing the limb up off the internal iliac artery.  I detached the main body. The stent delivery device was removed.   At this point, Dr. DiScot Dockassed the Q50 balloon up the right sheath. I inflated the molding balloon at the proximal aortic endograft, at the flow divider, and at the end of the left iliac limb. The balloon was deflated and placed on the right wire. I positioned this balloon on the distal extent of the ipsilateral limb.  This inflated to mold the ipslateral limb.    A pigtail catheter was loaded on the left side. A completion aortogram was completed. Based on the images, there appeared to be a no endoleak.  The right renal artery appeared to be occluded distally as was the left internal iliac artery.  I felt at this point, no further intervention was needed.  I gave the patient 40 mg of Protamine to reverse anticoagulation.    At this point, Dr. DiScot Docklaced a Bentson wire in the left pigtail catheter and removed  the catheter. I held pressure on the left common femoral artery, while Dr. Scot Dock removed the left sheath. He then completed the repair of the right common femoral artery with the previously placed Proglide sutures, sequentially applying tension to the sutures medial then lateral, with the wire in and then without the wire.  Tension was applied to the Proglide sutures with a hemostat.  I repaired the right common femoral artery in the exactly same fashion.  Each groin incision with repaired with a U-stitch of 4-0 Vicryl. The skin was cleaned, dried, and reinforced with  Dermabond.  Distally all pedal arteries were dopplerable.   COMPLICATIONS: none  CONDITION: stable   Adele Barthel, MD, [facility] [place] Office: [phone] Pager: [phone]  03/12/2016, 10:39 AM

## 2016-03-13 ENCOUNTER — Other Ambulatory Visit: Payer: Self-pay | Admitting: *Deleted

## 2016-03-13 ENCOUNTER — Encounter (HOSPITAL_COMMUNITY): Payer: Self-pay | Admitting: Vascular Surgery

## 2016-03-13 DIAGNOSIS — I2584 Coronary atherosclerosis due to calcified coronary lesion: Secondary | ICD-10-CM

## 2016-03-13 DIAGNOSIS — Z48812 Encounter for surgical aftercare following surgery on the circulatory system: Secondary | ICD-10-CM

## 2016-03-13 DIAGNOSIS — I714 Abdominal aortic aneurysm, without rupture, unspecified: Secondary | ICD-10-CM

## 2016-03-13 DIAGNOSIS — I2510 Atherosclerotic heart disease of native coronary artery without angina pectoris: Secondary | ICD-10-CM

## 2016-03-13 DIAGNOSIS — I340 Nonrheumatic mitral (valve) insufficiency: Secondary | ICD-10-CM

## 2016-03-13 DIAGNOSIS — I341 Nonrheumatic mitral (valve) prolapse: Secondary | ICD-10-CM

## 2016-03-13 DIAGNOSIS — R0609 Other forms of dyspnea: Secondary | ICD-10-CM

## 2016-03-13 LAB — HOMOCYSTEINE: Homocysteine: 12.7 umol/L (ref 0.0–15.0)

## 2016-03-13 LAB — BASIC METABOLIC PANEL
Anion gap: 6 (ref 5–15)
CALCIUM: 8.6 mg/dL — AB (ref 8.9–10.3)
CO2: 26 mmol/L (ref 22–32)
CREATININE: 0.68 mg/dL (ref 0.61–1.24)
Chloride: 102 mmol/L (ref 101–111)
GFR calc Af Amer: >60 mL/min (ref >60)
GLUCOSE: 94 mg/dL (ref 65–99)
Potassium: 3.9 mmol/L (ref 3.5–5.1)
SODIUM: 134 mmol/L — AB (ref 135–145)

## 2016-03-13 LAB — CBC
HCT: 37.1 % — ABNORMAL LOW (ref 39.0–52.0)
Hemoglobin: 12.4 g/dL — ABNORMAL LOW (ref 13.0–17.0)
MCH: 30.8 pg (ref 26.0–34.0)
MCHC: 33.4 g/dL (ref 30.0–36.0)
MCV: 92.1 fL (ref 78.0–100.0)
Platelets: 282 K/uL (ref 150–400)
RBC: 4.03 MIL/uL — ABNORMAL LOW (ref 4.22–5.81)
RDW: 13.9 % (ref 11.5–15.5)
WBC: 9.5 K/uL (ref 4.0–10.5)

## 2016-03-13 LAB — BETA-2-GLYCOPROTEIN I ABS, IGG/M/A: Beta-2-Glycoprotein I IgA: <9 GPI IgA units (ref 0–25)

## 2016-03-13 LAB — PROTEIN S ACTIVITY: Protein S Activity: 104 % (ref 63–140)

## 2016-03-13 LAB — PROTEIN S, TOTAL: PROTEIN S AG TOTAL: 56 % — AB (ref 60–150)

## 2016-03-13 LAB — CARDIOLIPIN ANTIBODIES, IGG, IGM, IGA: Anticardiolipin IgA: <9 APL U/mL (ref 0–11)

## 2016-03-13 LAB — LUPUS ANTICOAGULANT PANEL
DRVVT: 35 s (ref 0.0–47.0)
PTT Lupus Anticoagulant: 36.9 s (ref 0.0–51.9)

## 2016-03-13 LAB — PROTEIN C, TOTAL: PROTEIN C, TOTAL: 65 % (ref 60–150)

## 2016-03-13 LAB — PROTEIN C ACTIVITY: Protein C Activity: 78 % (ref 73–180)

## 2016-03-13 MED ORDER — CARVEDILOL 3.125 MG PO TABS: 3.1250 mg | ORAL_TABLET | Freq: Two times a day (BID) | ORAL | 1 refills | Status: DC

## 2016-03-13 MED ORDER — LOSARTAN POTASSIUM 25 MG PO TABS
25.0000 mg | ORAL_TABLET | Freq: Every day | ORAL | Status: DC
  Administered 2016-03-13: 25 mg via ORAL
  Filled 2016-03-13: qty 1

## 2016-03-13 MED ORDER — OXYCODONE HCL 5 MG PO TABS: 5.0000 mg | ORAL_TABLET | Freq: Four times a day (QID) | ORAL | 0 refills | Status: DC | PRN

## 2016-03-13 MED ORDER — LOSARTAN POTASSIUM 25 MG PO TABS: 25.0000 mg | ORAL_TABLET | Freq: Every day | ORAL | 1 refills | Status: DC

## 2016-03-13 MED ORDER — CARVEDILOL 3.125 MG PO TABS
3.1250 mg | ORAL_TABLET | Freq: Two times a day (BID) | ORAL | Status: DC
  Administered 2016-03-13: 3.125 mg via ORAL
  Filled 2016-03-13: qty 1

## 2016-03-13 NOTE — Consult Note (Addendum)
CARDIOLOGY CONSULT NOTE   Patient ID: William Kales [name] MRN: [id number] DOB/AGE: [birth year] 60 y.o.  Admit date: 03/12/2016  Primary Physician   [name], MD Primary Cardiologist   New Reason for Consultation   Abnormal echo Requesting Physician  Dr. Bridgett Larsson  HPI: William Kales [name] is a 60 y.o. male with a history of of AAA s/p stent graft this admission, lung mass (pending needle biopsy) and ongoing tobacco abuse who consulted for abnormal echo.   The patient was seen by Dr. Roxy Horseman for evaluation of left lung mass on PFT and PET scan. 8 x 3.2 cm superior segment left lower lobe lung mass is hypermetabolic and consistent with primary lung neoplasm. No mediastinal or hilar lymphadenopathy and no findings to suggest metastatic disease. Plan for needed biopsy after this admission and further treatment plan.   Pt has procedure 03/13/16:  1. Right common femoral artery cannulation under ultrasound guidance 2. "Preclose" repair of right common femoral artery  3. Placement of catheter in aorta  4. Aortogram 5. Placement of bifurcated aortic endograft (48m x 14 mm x 15cm)  6. Radiologic S&I 7. Placement of left iliac limb (174mx 12cm) 8. Left common femoral artery cannulation under ultrasound guidance 9. "Preclose" repair of left common femoral artery  10. Placement of catheter in aorta   Echo done yesterday showed normal LV function to 60-65%, moderate LVH, MV anterior leaflet prolapse with moderate eccentric posterior directed MR cannot r/o parachute MV. Other mild valvular abnormality. Cardiology asked for further assessment. No prior cardiac hx or rheumatic heart disease. Denies hx of IV drug use. Hx of tobacco smoking of 1 pack a day for the past 46 years. Drink 1 case of bear every week. No family hx of early CAD. The patient denies nausea, vomiting, fever, chest pain, palpitations, shortness of breath, orthopnea, PND, dizziness, syncope, cough, congestion, abdominal  pain, hematochezia, melena, lower extremity edema.  EKG 03/06/16 showed non specific ST abnormality in inferior lateral leads with TWI in V1 and V2.   Past Medical History:  Diagnosis Date  . [place]  . History of hepatitis B   . HNP (herniated nucleus pulposus), lumbar    L4 with radiculopathy  . Mass of left lung   . Varicose veins of legs      Past Surgical History:  Procedure Laterality Date  . COLONOSCOPY    . INGUINAL HERNIA REPAIR Left 1975   Left inguinal hernia  . INGUINAL HERNIA REPAIR Right   . LUMBAR LAMINECTOMY  October 22, 2012    Allergies  Allergen Reactions  . Tramadol Nausea And Vomiting    I have reviewed the patient's current medications . docusate sodium  100 mg Oral Daily  . enoxaparin (LOVENOX) injection  40 mg Subcutaneous Q24H  . pantoprazole  40 mg Oral Daily   . sodium chloride     sodium chloride, acetaminophen **OR** acetaminophen, alum & mag hydroxide-simeth, bisacodyl, fentaNYL (SUBLIMAZE) injection, guaiFENesin-dextromethorphan, hydrALAZINE, labetalol, magnesium hydroxide, metoprolol, morphine injection, ondansetron (ZOFRAN) IV, ondansetron, oxyCODONE, phenol, potassium chloride  Prior to Admission medications   Not on File     Social History   Social History  . Marital status: Married    Spouse name: N/A  . Number of children: N/A  . Years of education: N/A   Occupational History  . Not on file.   Social History Main Topics  . Smoking status: Former Smoker    Packs/day: 1.00  Types: Cigarettes    Quit date: 03/05/2016  . Smokeless tobacco: Never Used  . Alcohol use 7.2 oz/week    12 Cans of beer per week  . 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 Sexual activity: Not on file   Other Topics Concern  . Not on file   Social History Narrative  . No narrative on file    Family Status  Relation Status  . Mother Alive  . Father Deceased at age 17    MI, coronary disease   Family History  Problem Relation Age of Onset  . Diabetes Mother     ROS:  Full 14 point review of systems complete and found to be negative unless listed above.  Physical Exam: Blood pressure (!) 149/71, pulse 98, temperature 98.1 F (36.7 C), temperature source Oral, resp. rate (!) 25, height '6\' 3"'$  (1.905 m), weight 149 lb 7.6 oz (67.8 kg), SpO2 96 %.  General: Well developed, well nourished, male in no acute distress Head: Eyes PERRLA, No xanthomas. Normocephalic and atraumatic, oropharynx without edema or exudate.  Lungs: Resp regular and unlabored, CTA. Heart: RRR no s3, s4, Systolic murmurs..   Neck: No carotid bruits. No lymphadenopathy.  No JVD. Abdomen: Bowel sounds present, abdomen soft and non-tender without masses or hernias noted. Msk:  No spine or cva tenderness. No weakness, no joint deformities or effusions. Extremities: No clubbing, cyanosis or edema. DP/PT/Radials 2+ and equal bilaterally. Neuro: Alert and oriented X 3. No focal deficits noted. Psych:  Good affect, responds appropriately Skin: No rashes or lesions noted.  Labs:   Lab Results  Component Value Date   WBC 9.5 03/13/2016   HGB 12.4 (L) 03/13/2016   HCT 37.1 (L) 03/13/2016   MCV 92.1 03/13/2016   PLT 282 03/13/2016    Recent Labs  03/12/16 0934  INR 1.22    Recent Labs Lab 03/13/16 0500  NA 134*  K 3.9  CL 102  CO2 26  BUN <5*  CREATININE 0.68  CALCIUM 8.6*  GLUCOSE 94   Magnesium  Date Value Ref Range Status  03/12/2016 1.6 (L) 1.7 - 2.4 mg/dL Final     Echo 03/12/16 LV EF: 60% -   65%  ------------------------------------------------------------------- Indications:      Venous thrombus iliac 451.81, suspected, pre-procedure.  ------------------------------------------------------------------- History:   PMH:  S/P abdominal endovascular stent graft. Evaluate for intracardiac  thrombus.  ------------------------------------------------------------------- Study Conclusions  - Left ventricle: The cavity size was normal. Wall thickness was   increased in a pattern of moderate LVH. Systolic function was   normal. The estimated ejection fraction was in the range of 60%   to 65%. - Mitral valve: Anterior leaflet prolapse with likley moderate   eccentric posteriorly directed MR   Anteiror leaflet is very thickend and myxomatous Cannot r/o   parachute MV doubt   vegetation consider TEE if clinically indicated - Atrial septum: No defect or patent foramen ovale was identified  Radiology:  Dg Chest Port 1 View  Result Date: 03/12/2016 CLINICAL DATA:  Central line placement. Endovascular repair of abdominal aortic aneurysm. EXAM: PORTABLE CHEST 1 VIEW COMPARISON:  Chest x-ray dated 12/05/2016 and chest CT dated 02/16/2016 FINDINGS: Right central catheter tip appears in good position at the level of the azygos vein overlying the superior vena cava. Heart size and pulmonary vascularity are normal. Left perihilar lung mass is not well seen on this image. No  effusions.  No pneumothorax. IMPRESSION: Central line in good position. No pneumothorax. Left perihilar lung mass as previously demonstrated. Electronically Signed   By: Lorriane Shire M.D.   On: 03/12/2016 10:21   Dg Abd Portable 1v  Result Date: 03/12/2016 CLINICAL DATA:  Abdominal aortic aneurysm, status post endovascular repair EXAM: PORTABLE ABDOMEN - 1 VIEW COMPARISON:  CT scan dated 02/24/2016 FINDINGS: Aorto bi-iliac stent graft appears in good position. Bowel gas pattern is normal. Contrast in both renal collecting systems. Foley catheter in place. IMPRESSION: Benign-appearing abdomen.  Aorto bi-iliac stent graft in place. Electronically Signed   By: Lorriane Shire M.D.   On: 03/12/2016 10:22     ASSESSMENT AND PLAN:     1. MV prolapse - Asymptomatic. No hx of IV drug use or rheumatic heart disease. EKG with  non -specific changes. No prior EKG to compare. MD to see today.   2. AAA s/p endo graft repair 03/12/16  3. Lung mass with metastatic disease - Pending biopsy and treatment plan per surgery post discharge.   4. Tobacco and alcohol abuse - Advise cessation. Education given.   SignedLeanor Kail, PA 03/13/2016, 1:56 PM Pager [phone]  Co-Sign MD   The patient was seen, examined and discussed with Bhagat,Bhavinkumar PA-C and I agree with the above.   This is a very pleasant 60 year old male with h/o smoking, 6.2 cm infrarenal AAA, s/p endovascular repair with graft stenting on 03/12/16, he also had an incidental finding of a lung mass on chest CT - Superior segment left lower lobe lung mass, consistent with primary bronchogenic carcinoma. No evidence of thoracic nodal or extra thoracic metastasis. FDG PET showed 3.8 x 3.2 cm superior segment left lower lobe lung mass is hypermetabolic and consistent with primary lung neoplasm. No mediastinal or hilar lymphadenopathy and no findings to suggest metastatic disease. Biopsies are pending.  The patient underwent echocardiography that showed normal LVEF with myxomatous - anterior flail MV with most probably severe MR - eccentric posterior jet. No pulmonary hypertension.  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 Denies palpitations, but has noticed some mild LE edema, no orthopnea or PND. He also has a lung cancer that could cause dyspnea on exertion. Physical exam is consistent with no JVDs, holosystolic 5/6 murmur, trace LE edema.   Plan:  This patient will eventually need MV repair if his prognosis is good from oncology standpoint. However, his lung cancer therapy  is the priority right now. His cardiac condition won't preclude possible lung surgery.  Once his lung cancer is under control he will need a TEE to further  evaluate his mitral valve anatomy and MR severity, and also have ischemic workup as tere is evidence on calcifications in his coronary arteries on chest CT.  For now I would treat medically, he need good BP management, I will start losartan 25 mg po daily and carvedilol 3.125 mg po BID.  [name], MD 03/13/2016

## 2016-03-13 NOTE — Discharge Summary (Signed)
EVAR Discharge Summary   William GARNO [name] 02/15/1956 60 y.o. male  MRN: [id number]  Admission Date: 03/12/2016  Discharge Date: 03/13/16  Physician: Conrad Benbow, MD  Admission Diagnosis: Abdominal Aortic Aneurysm I71.4   HPI:   This is a 60 y.o. male who presents with chief complaint: follow up for AAA.  The patient was seen just Wednesday.  He returns from his CTA abd/pelvis today.  He has had no back or abd pain.  He is scheduled to see Cardiology on 7 NOV 17.  He was also seen by CT surgery for lung mass.  It was recommended to proceed with EVAR and follow up for needle bx of the LLL lesion would be scheduled afterwards.    Hospital Course:  The patient was admitted to the hospital and taken to the operating room on 03/12/2016 and underwent: 1. Right common femoral artery cannulation under ultrasound guidance 2. "Preclose" repair of right common femoral artery  3. Placement of catheter in aorta  4. Aortogram 5. Placement of bifurcated aortic endograft (61m x 14 mm x 15cm)  6. Radiologic S&I (Dictated by Dr. DScot Dock 7. Placement of left iliac limb (163mx 12cm) 8. Left common femoral artery cannulation under ultrasound guidance 9. "Preclose" repair of left common femoral artery  Placement of catheter in aorta     Intraoperative findings included the following: 1. 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  Successful exclusion of abdominal aortic aneurysm with patient left renal artery 3. Patent right internal iliac artery with interval occluded left internal iliac artery  4.  No endoleak  5.  Doppler pedal signals in both feet The pt tolerated the procedure well and was transported to the PACU in good condition.    Given the interval occlusion of the right renal artery and left internal iliac artery, an echo was ordered as well as a cardiology consult.   He did have a 2D echo that day with the following results: Study  Conclusions  - Left ventricle: The cavity size was normal. Wall thickness was   increased in a pattern of moderate LVH. Systolic function was   normal. The estimated ejection fraction was in the range of 60%   to 65%. - Mitral valve: Anterior leaflet prolapse with likley moderate   eccentric posteriorly directed MR   Anteiror leaflet is very thickend and myxomatous Cannot r/o   parachute MV doubt   vegetation consider TEE if clinically indicated - Atrial septum: No defect or patent foramen ovale was identified.  In the recovery room, he did have some back pain, but this is not new and is positional.    On POD 1, the pt was doing well from surgical standpoint.  Cardiology did see the pt with the follow recommendations.  This patient will eventually need MV repair if his prognosis is good from oncology standpoint. However, his lung cancer therapy  is the priority right now. His cardiac condition won't preclude possible lung surgery.  Once his lung cancer is under control he will need a TEE to further evaluate his mitral valve anatomy and MR severity, and also have ischemic workup as tere is evidence on calcifications in his coronary arteries on chest CT.  For now I would treat medically, he need good BP management, I will start losartan 25 mg po daily and carvedilol 3.125 mg po BID.  The remainder of the hospital course consisted of increasing mobilization and increasing intake of solids without difficulty.  CBC    Component Value Date/Time   WBC 9.5 03/13/2016 0500   RBC 4.03 (L) 03/13/2016 0500   HGB 12.4 (L) 03/13/2016 0500   HCT 37.1 (L) 03/13/2016 0500   PLT 282 03/13/2016 0500   MCV 92.1 03/13/2016 0500   MCH 30.8 03/13/2016 0500   MCHC 33.4 03/13/2016 0500   RDW 13.9 03/13/2016 0500    BMET    Component Value Date/Time   NA 134 (L) 03/13/2016 0500   K 3.9 03/13/2016 0500   CL 102 03/13/2016 0500   CO2 26 03/13/2016 0500   GLUCOSE 94 03/13/2016 0500   BUN <5 (L)  03/13/2016 0500   CREATININE 0.68 03/13/2016 0500   CALCIUM 8.6 (L) 03/13/2016 0500   GFRNONAA >60 03/13/2016 0500   GFRAA >60 03/13/2016 0500       Discharge Instructions    ABDOMINAL PROCEDURE/ANEURYSM REPAIR/AORTO-BIFEMORAL BYPASS:  Call MD for increased abdominal pain; cramping diarrhea; nausea/vomiting    Complete by:  As directed    Call MD for:  redness, tenderness, or signs of infection (pain, swelling, bleeding, redness, odor or green/yellow discharge around incision site)    Complete by:  As directed    Call MD for:  severe or increased pain, loss or decreased feeling  in affected limb(s)    Complete by:  As directed    Call MD for:  temperature >100.5    Complete by:  As directed    Discharge wound care:    Complete by:  As directed    Shower daily with soap and water starting 03/14/16   Driving Restrictions    Complete by:  As directed    No driving for 2 weeks   Lifting restrictions    Complete by:  As directed    No lifting for 4 weeks   Resume previous diet    Complete by:  As directed       Discharge Diagnosis:  Abdominal Aortic Aneurysm I71.4  Secondary Diagnosis: Patient Active Problem List   Diagnosis Date Noted  . AAA (abdominal aortic aneurysm) ([place]) 03/12/2016  . AAA (abdominal aortic aneurysm) without rupture ([place]) 02/04/2014   Past Medical History:  Diagnosis Date  . AAA (abdominal aortic aneurysm) ([place])   . History of hepatitis B   . HNP (herniated nucleus pulposus), lumbar    L4 with radiculopathy  . Mass of left lung   . Varicose veins of legs        Medication List    You have not been prescribed any medications.     Prescriptions given: 1.  Coreg 3.125 bid (refills per cards) 2.  Losartan '25mg'$  qday (refills per cards) 3.  OxyIR '5mg'$  q6hr #10 No Refill  Instructions: 1.  No heavy lifting x 4 weeks 2.  No driving x 2 weeks and while taking pain medication 3.  Shower daily starting 03/14/16  Disposition: home  Patient's  condition: is Good  Follow up: 1. Dr. Bridgett Larsson in 4 weeks with CTA protocol   Leontine Locket, PA-C [facility] [phone] 03/13/2016  2:59 PM  Addendum  This patient underwent EVAR for a large AAA.  During his CT studies, a presumed lung cancer was also found.  CT surgery had recommended proceeding with his AAA repair.  His EVAR was unremarkable except for intraoperative findings consistent with Right renal artery occlusion prior to any intervention, suggesting the right renal artery had occluded due to thromboembolism.  The left internal iliac artery was also occluded.  On his preoperative studies, there appears to be disease already present in the left internal iliac artery, so it was no clear if this was also thromboembolism.  Postoperatively, the patient had cardiology evaluation for TTE consistent with severe mitral regurgitation.  There was no obvious thrombus on the TTE.  He also had a thrombophilia work-up completed.  The patient will follow up with me in 4 weeks with interval CTA abd/pelvis.   Adele Barthel, MD, FACS [facility] [place] Office: [phone] Pager: [phone]  03/20/2016, 12:35 PM    - For VQI Registry use - Post-op:  Time to Extubation: '[x]'$  In OR, '[ ]'$  < 12 hrs, '[ ]'$  12-24 hrs, '[ ]'$  >=24 hrs Vasopressors Req. Post-op: No MI: No., '[ ]'$  Troponin only, '[ ]'$  EKG or Clinical New Arrhythmia: Yes CHF: No ICU Stay: 1 day in stepdown Transfusion: No     If yes, n/a units given  Complications: Resp failure: No., '[ ]'$  Pneumonia, '[ ]'$  Ventilator Chg in renal function: No., '[ ]'$  Inc. Cr > 0.5, '[ ]'$  Temp. Dialysis,  '[ ]'$  Permanent dialysis Leg ischemia: No., no Surgery needed, '[ ]'$  Yes, Surgery needed,  '[ ]'$  Amputation Bowel ischemia: No., '[ ]'$  Medical Rx, '[ ]'$  Surgical Rx Wound complication: No., '[ ]'$  Superficial separation/infection, '[ ]'$  Return to OR Return to OR: No  Return to OR for bleeding: No Stroke: No., '[ ]'$  Minor, '[ ]'$   Major  Discharge medications: Statin use:  No If No:  ASA use:  No  If No:  Plavix use:  No  Beta blocker use:  No  ARB use:  No ACEI use:  No CCB use:  No

## 2016-03-13 NOTE — Care Management Note (Signed)
Case Management Note  Patient Details  Name: William FLOTT [name] MRN: [id number] Date of Birth: 03-19-1956  Subjective/Objective:    S/p EVAR repair, lives with wife, pta indep, has pcp, has medication coverage and transportation at discharge.                 Action/Plan:   Expected Discharge Date:                  Expected Discharge Plan:  Home/Self Care  In-House Referral:     Discharge planning Services  CM Consult  Post Acute Care Choice:    Choice offered to:     DME Arranged:    DME Agency:     HH Arranged:    HH Agency:     Status of Service:  Completed, signed off  If discussed at [employer] of Stay Meetings, dates discussed:    Additional Comments:  Zenon Mayo, RN 03/13/2016, 4:40 PM

## 2016-03-13 NOTE — Progress Notes (Signed)
   Daily Progress Note  Appreciate Cardiology's assistance.  Will let patient go.  So far thromboembolic work-up non-diagnostic.  Thrombophilia screen negative so far.  - Ok to D/C - Pt will follow up in the office in 3 weeks with post-op CTA    Adele Barthel, MD, [place] [facility] [place] Office: [phone] Pager: [phone]  03/13/2016, 4:30 PM

## 2016-03-13 NOTE — Progress Notes (Signed)
Discussed discharge instructions and medications with patient and wife. Both verbalized understanding with all questions answered. VSS. Pt discharged home with wife. Handout given. Pulses palpable and surgical site WNL.  [facility]

## 2016-03-13 NOTE — Progress Notes (Signed)
  Progress Note    03/13/2016 7:13 AM 1 Day Post-Op  Subjective:  C/o back pain when he lays in the bed too long  Afebrile HR  60's-70's NSR 520'E-022'V systolic 36% RA  Vitals:   03/12/16 2315 03/13/16 0259  BP: 122/76 129/71  Pulse: 71 68  Resp: 19 17  Temp: 98.6 F (37 C) 98.7 F (37.1 C)    Physical Exam: Cardiac:  regular Lungs:  Non labored Incisions:  Bilateral groins are soft without hematoma Extremities:  + palpable bilateral DP pulses Abdomen:  Soft, NT/ND  CBC    Component Value Date/Time   WBC 9.5 03/13/2016 0500   RBC 4.03 (L) 03/13/2016 0500   HGB 12.4 (L) 03/13/2016 0500   HCT 37.1 (L) 03/13/2016 0500   PLT 282 03/13/2016 0500   MCV 92.1 03/13/2016 0500   MCH 30.8 03/13/2016 0500   MCHC 33.4 03/13/2016 0500   RDW 13.9 03/13/2016 0500    BMET    Component Value Date/Time   NA 137 03/12/2016 0934   K 4.0 03/12/2016 0934   CL 108 03/12/2016 0934   CO2 24 03/12/2016 0934   GLUCOSE 107 (H) 03/12/2016 0934   BUN 6 03/12/2016 0934   CREATININE 0.59 (L) 03/12/2016 0934   CALCIUM 8.5 (L) 03/12/2016 0934   GFRNONAA >60 03/12/2016 0934   GFRAA >60 03/12/2016 0934    INR    Component Value Date/Time   INR 1.22 03/12/2016 0934     Intake/Output Summary (Last 24 hours) at 03/13/16 0713 Last data filed at 03/13/16 0300  Gross per 24 hour  Intake             1500 ml  Output              905 ml  Net              595 ml     Assessment:  60 y.o. male is s/p:  1. Right common femoral artery cannulation under ultrasound guidance 2. "Preclose" repair of right common femoral artery  3. Placement of catheter in aorta  4. Aortogram 5. Placement of bifurcated aortic endograft (6m x 14 mm x 15cm)  6. Radiologic S&I  (Dictated by Dr. DScot Dock 7. Placement of left iliac limb (144mx 12cm) 8. Left common femoral artery cannulation under ultrasound guidance 9. "Preclose" repair of left common femoral artery  Placement of catheter in  aorta  1 Day Post-Op  Plan: -pt doing well this morning.   -BMP is pending-creatinine yesterday was normal. -hypercoagulable panel is pending -pt has not voided since foley removed -ambulate pt -possibly home later today -DVT prophylaxis:  Lovenox   SaLeontine LocketPA-C [facility] [phone]/21/2017 7:13 AM

## 2016-03-13 NOTE — Progress Notes (Signed)
   Daily Progress Note   Assessment/Planning: POD #1 s/p EVAR   R RA and L IIA thromboembolism concerning for possible embolic source  Thrombophilia panel ordered:  Nothing concerning so far  TTE done: abnormal MV findings, so will have Cardiology review and see if they recommend TTE to further evaluate the MV  AM labs including BMP pending.  Good UOP so far  From surgical viewpoint, normally would be a discharge candidate.  Will need to get an answer from Cardiology whether further work-up on MV will be needed prior to d/c   Subjective  - 1 Day Post-Op  Minimal complaints: chronic back pain, urinating like usual without any unusual urine  Objective Vitals:   03/12/16 1427 03/12/16 1953 03/12/16 2315 03/13/16 0259  BP: 130/84 (!) 177/90 122/76 129/71  Pulse: 63 67 71 68  Resp: '13 20 19 17  '$ Temp: 98.1 F (36.7 C) 98.2 F (36.8 C) 98.6 F (37 C) 98.7 F (37.1 C)  TempSrc: Oral Oral Oral Oral  SpO2: 98% 97% 98% 98%  Weight:      Height:         Intake/Output Summary (Last 24 hours) at 03/13/16 0717 Last data filed at 03/13/16 0300  Gross per 24 hour  Intake             1500 ml  Output              905 ml  Net              595 ml    PULM  CTAB CV  RRR GI  soft, NTND, no palpable AAA VASC  B groin incision c/d/i, no hematoma, warm feet with palpable DP  Laboratory CBC    Component Value Date/Time   WBC 9.5 03/13/2016 0500   HGB 12.4 (L) 03/13/2016 0500   HCT 37.1 (L) 03/13/2016 0500   PLT 282 03/13/2016 0500    BMET    Component Value Date/Time   NA 137 03/12/2016 0934   K 4.0 03/12/2016 0934   CL 108 03/12/2016 0934   CO2 24 03/12/2016 0934   GLUCOSE 107 (H) 03/12/2016 0934   BUN 6 03/12/2016 0934   CREATININE 0.59 (L) 03/12/2016 0934   CALCIUM 8.5 (L) 03/12/2016 0934   GFRNONAA >60 03/12/2016 [place] >60 03/12/2016 [place], MD, FACS [facility] [place] Office: [phone] Pager:  [phone]  03/13/2016, 7:17 AM

## 2016-03-14 ENCOUNTER — Telehealth: Payer: Self-pay | Admitting: Vascular Surgery

## 2016-03-14 NOTE — Telephone Encounter (Signed)
Spoke to spouse about appt 12/27 post op Also has CTA 12/22   Letter mailed

## 2016-03-14 NOTE — Telephone Encounter (Signed)
-----   Message from Mena Goes, RN sent at 03/13/2016 10:18 AM EST ----- Regarding: CTA and OV in 4 weeks for post EVAR 4 weeks CTA and see Dr. Scot Dock  ----- Message ----- From: Angelia Mould, MD Sent: 03/12/2016   9:29 AM To: [facility] Charge Pool Subject: charge                                         PROCEDURE:  1. Pre-closure left common femoral artery with 2 Perclose devices 2.  Introduction of left iliac limb (38m X 12 cm [place])  CO-SURGEON: CJudeth Cornfield DScot Dock MD, BAdele Barthel MD

## 2016-03-16 LAB — FACTOR 5 LEIDEN

## 2016-03-19 LAB — PROTHROMBIN GENE MUTATION

## 2016-03-27 ENCOUNTER — Other Ambulatory Visit: Payer: Self-pay | Admitting: Radiology

## 2016-03-29 ENCOUNTER — Ambulatory Visit (HOSPITAL_COMMUNITY)
Admission: RE | Admit: 2016-03-29 | Discharge: 2016-03-29 | Disposition: A | Discharge status: Home / Self Care | Payer: BLUE CROSS/BLUE SHIELD | Source: Ambulatory Visit | Attending: Cardiothoracic Surgery | Admitting: Cardiothoracic Surgery

## 2016-03-29 ENCOUNTER — Ambulatory Visit (HOSPITAL_COMMUNITY)
Admission: RE | Admit: 2016-03-29 | Discharge: 2016-03-29 | Disposition: A | Discharge status: Home / Self Care | Payer: BLUE CROSS/BLUE SHIELD | Source: Ambulatory Visit | Attending: Interventional Radiology | Admitting: Interventional Radiology

## 2016-03-29 ENCOUNTER — Encounter (HOSPITAL_COMMUNITY): Payer: Self-pay

## 2016-03-29 DIAGNOSIS — B1910 Unspecified viral hepatitis B without hepatic coma: Secondary | ICD-10-CM | POA: Diagnosis not present

## 2016-03-29 DIAGNOSIS — M5126 Other intervertebral disc displacement, lumbar region: Secondary | ICD-10-CM | POA: Diagnosis not present

## 2016-03-29 DIAGNOSIS — J95811 Postprocedural pneumothorax: Secondary | ICD-10-CM

## 2016-03-29 DIAGNOSIS — Z87891 Personal history of nicotine dependence: Secondary | ICD-10-CM | POA: Diagnosis not present

## 2016-03-29 DIAGNOSIS — I714 Abdominal aortic aneurysm, without rupture: Secondary | ICD-10-CM | POA: Insufficient documentation

## 2016-03-29 DIAGNOSIS — I712 Thoracic aortic aneurysm, without rupture: Secondary | ICD-10-CM | POA: Diagnosis not present

## 2016-03-29 DIAGNOSIS — R634 Abnormal weight loss: Secondary | ICD-10-CM | POA: Insufficient documentation

## 2016-03-29 DIAGNOSIS — C3490 Malignant neoplasm of unspecified part of unspecified bronchus or lung: Secondary | ICD-10-CM | POA: Insufficient documentation

## 2016-03-29 DIAGNOSIS — I8393 Asymptomatic varicose veins of bilateral lower extremities: Secondary | ICD-10-CM | POA: Diagnosis not present

## 2016-03-29 DIAGNOSIS — C3432 Malignant neoplasm of lower lobe, left bronchus or lung: Secondary | ICD-10-CM | POA: Diagnosis not present

## 2016-03-29 DIAGNOSIS — R918 Other nonspecific abnormal finding of lung field: Secondary | ICD-10-CM | POA: Insufficient documentation

## 2016-03-29 LAB — CBC
HEMATOCRIT: 41.2 % (ref 39.0–52.0)
Hemoglobin: 14.1 g/dL (ref 13.0–17.0)
MCH: 30.9 pg (ref 26.0–34.0)
MCHC: 34.2 g/dL (ref 30.0–36.0)
MCV: 90.4 fL (ref 78.0–100.0)
PLATELETS: 387 10*3/uL (ref 150–400)
RBC: 4.56 MIL/uL (ref 4.22–5.81)
RDW: 13 % (ref 11.5–15.5)
WBC: 10.4 10*3/uL (ref 4.0–10.5)

## 2016-03-29 LAB — PROTIME-INR
INR: 1.09
Prothrombin Time: 14.2 seconds (ref 11.4–15.2)

## 2016-03-29 LAB — APTT: APTT: 31 s (ref 24–36)

## 2016-03-29 MED ORDER — MIDAZOLAM HCL 2 MG/2ML IJ SOLN
INTRAMUSCULAR | Status: AC
  Filled 2016-03-29: qty 4

## 2016-03-29 MED ORDER — FENTANYL CITRATE (PF) 100 MCG/2ML IJ SOLN
INTRAMUSCULAR | Status: AC | PRN
  Administered 2016-03-29: 50 ug via INTRAVENOUS
  Administered 2016-03-29: 25 ug via INTRAVENOUS

## 2016-03-29 MED ORDER — SODIUM CHLORIDE 0.9 % IV SOLN
INTRAVENOUS | Status: AC | PRN
  Administered 2016-03-29: 10 mL/h via INTRAVENOUS

## 2016-03-29 MED ORDER — SODIUM CHLORIDE 0.9 % IV SOLN: INTRAVENOUS | Status: DC

## 2016-03-29 MED ORDER — LIDOCAINE HCL (PF) 1 % IJ SOLN
INTRAMUSCULAR | Status: AC
  Filled 2016-03-29: qty 30

## 2016-03-29 MED ORDER — FENTANYL CITRATE (PF) 100 MCG/2ML IJ SOLN
INTRAMUSCULAR | Status: AC
  Filled 2016-03-29: qty 4

## 2016-03-29 MED ORDER — MIDAZOLAM HCL 2 MG/2ML IJ SOLN
INTRAMUSCULAR | Status: AC | PRN
  Administered 2016-03-29: 0.5 mg via INTRAVENOUS
  Administered 2016-03-29: 1 mg via INTRAVENOUS

## 2016-03-29 NOTE — Procedures (Signed)
Interventional Radiology Procedure Note  Procedure: CT guided biopsy of LLL mass Complications: None Recommendations: - Bedrest until CXR cleared.  Minimize talking, coughing or otherwise straining.  - Follow up 1 hr CXR pending  -NPO for now  Signed,  Dulcy Fanny. Earleen Newport, DO

## 2016-03-29 NOTE — H&P (Signed)
Chief Complaint: Patient was seen in consultation today for left lung mass biopsy at the request of Gerhardt,Edward B  Referring Physician(s): [name]  Supervising Physician: Corrie Mckusick  Patient Status: [facility] - Out-pt  History of Present Illness: [name]. is a 60 y.o. male   Left lung mass was noted incidentally on CT Abd --evaluation of AAA + tobacco use + wt loss  PET 03/07/16: IMPRESSION: 1. 3.8 x 3.2 cm superior segment left lower lobe lung mass is hypermetabolic and consistent with primary lung neoplasm. No mediastinal or hilar lymphadenopathy and no findings to suggest metastatic disease. 2. A few small scattered pulmonary nodules are likely benign but will require surveillance. 3. Stable ascending aortic aneurysm and infrarenal abdominal aortic aneurysm.  Request for biopsy of same per Dr Servando Snare  AAA surgery 03/12/16 with Dr Bridgett Larsson  Past Medical History:  Diagnosis Date  . AAA (abdominal aortic aneurysm) ([place])   . History of hepatitis B   . HNP (herniated nucleus pulposus), lumbar    L4 with radiculopathy  . Mass of left lung   . Varicose veins of legs     Past Surgical History:  Procedure Laterality Date  . ABDOMINAL AORTIC ENDOVASCULAR STENT GRAFT N/A 03/12/2016   Procedure: ABDOMINAL AORTIC ENDOVASCULAR STENT GRAFT;  Surgeon: Conrad Berwind, MD;  Location: [place];  Service: Vascular;  Laterality: N/A;  . COLONOSCOPY    . INGUINAL HERNIA REPAIR Left 1975   Left inguinal hernia  . INGUINAL HERNIA REPAIR Right   . LUMBAR LAMINECTOMY  October 22, 2012    Allergies: Tramadol  Medications: Prior to Admission medications   Medication Sig Start Date End Date Taking? Authorizing Provider  carvedilol (COREG) 3.125 MG tablet Take 1 tablet (3.125 mg total) by mouth 2 (two) times daily with a meal. 03/13/16  Yes Ulyses Amor, PA-C  losartan (COZAAR) 25 MG tablet Take 1 tablet (25 mg total) by mouth daily. 03/13/16  Yes Ulyses Amor,  PA-C  oxyCODONE (OXY IR/ROXICODONE) 5 MG immediate release tablet Take 1 tablet (5 mg total) by mouth every 6 (six) hours as needed for moderate pain. 03/13/16  Yes Ulyses Amor, PA-C     Family History  Problem Relation Age of Onset  . Diabetes Mother     Social History   Social History  . Marital status: Married    Spouse name: N/A  . Number of children: N/A  . Years of education: N/A   Social History Main Topics  . Smoking status: Former Smoker    Packs/day: 1.00    Types: Cigarettes    Quit date: 03/05/2016  . Smokeless tobacco: Never Used  . Alcohol use 7.2 oz/week    12 Cans of beer per week  . 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 Sexual activity: Not Asked   Other Topics Concern  . None   Social History Narrative  . None    Review of Systems: A 12 point ROS discussed and pertinent positives are indicated in the HPI above.  All other systems are negative.  Review of Systems  Constitutional: Positive for activity change, appetite change, fatigue and unexpected weight change. Negative for fever.  Respiratory: Negative for cough and shortness of breath.   Gastrointestinal: Negative for abdominal pain.  Neurological: Positive for weakness.  Psychiatric/Behavioral: Negative for behavioral problems and confusion.    Vital Signs: BP Marland Kitchen)  155/75   Pulse 71   Temp 98.5 F (36.9 C)   Resp 20   Ht '6\' 3"'$  (1.905 m)   Wt 147 lb (66.7 kg)   SpO2 97%   BMI 18.37 kg/m   Physical Exam  Constitutional: He is oriented to person, place, and time.  Cardiovascular: Normal rate and regular rhythm.   Pulmonary/Chest: Effort normal and breath sounds normal. He has no wheezes.  Abdominal: Soft. Bowel sounds are normal. There is no tenderness.  Musculoskeletal: Normal range of motion.  Neurological: He is alert and oriented to person, place, and time.  Skin: Skin is warm and dry.  Psychiatric: He has a normal mood and affect.  His behavior is normal. Judgment and thought content normal.  Nursing note and vitals reviewed.   Mallampati Score:  MD Evaluation Airway: WNL Heart: WNL Abdomen: WNL Chest/ Lungs: WNL ASA  Classification: 3 Mallampati/Airway Score: One  Imaging: Nm Pet Image Initial (pi) Skull Base To Thigh  Result Date: 03/07/2016 CLINICAL DATA:  Initial treatment strategy for left lung mass. EXAM: NUCLEAR MEDICINE PET SKULL BASE TO THIGH TECHNIQUE: 7.5 mCi F-18 FDG was injected intravenously. Full-ring PET imaging was performed from the skull base to thigh after the radiotracer. CT data was obtained and used for attenuation correction and anatomic localization. FASTING BLOOD GLUCOSE:  Value: 114 mg/dl COMPARISON:  Chest CT 02/16/2016 FINDINGS: NECK No hypermetabolic lymph nodes in the neck. CHEST No hypermetabolic mediastinal or hilar nodes. The left lower lobe lung mass measures 3.8 x 3.2 cm and is metabolically active with SUV max of 9.3. Extensive pleural calcifications noted in the right lung posteriorly likely due to prior hemothorax or old empyema. Small scattered subpleural pulmonary nodules are stable. Recommend attention on future scans. Stable emphysematous changes and pulmonary scarring. 4 cm ascending aortic aneurysm. ABDOMEN/PELVIS No abnormal hypermetabolic activity within the liver, pancreas, adrenal glands, or spleen. No hypermetabolic lymph nodes in the abdomen or pelvis. New line stable 6 cm infrarenal abdominal aortic aneurysm. SKELETON No focal hypermetabolic activity to suggest skeletal metastasis. IMPRESSION: 1. 3.8 x 3.2 cm superior segment left lower lobe lung mass is hypermetabolic and consistent with primary lung neoplasm. No mediastinal or hilar lymphadenopathy and no findings to suggest metastatic disease. 2. A few small scattered pulmonary nodules are likely benign but will require surveillance. 3. Stable ascending aortic aneurysm and infrarenal abdominal aortic aneurysm.  Electronically Signed   By: Marijo Sanes M.D.   On: 03/07/2016 16:54   Dg Chest Port 1 View  Result Date: 03/12/2016 CLINICAL DATA:  Central line placement. Endovascular repair of abdominal aortic aneurysm. EXAM: PORTABLE CHEST 1 VIEW COMPARISON:  Chest x-ray dated 12/05/2016 and chest CT dated 02/16/2016 FINDINGS: Right central catheter tip appears in good position at the level of the azygos vein overlying the superior vena cava. Heart size and pulmonary vascularity are normal. Left perihilar lung mass is not well seen on this image. No effusions.  No pneumothorax. IMPRESSION: Central line in good position. No pneumothorax. Left perihilar lung mass as previously demonstrated. Electronically Signed   By: Lorriane Shire M.D.   On: 03/12/2016 10:21   Dg Abd Portable 1v  Result Date: 03/12/2016 CLINICAL DATA:  Abdominal aortic aneurysm, status post endovascular repair EXAM: PORTABLE ABDOMEN - 1 VIEW COMPARISON:  CT scan dated 02/24/2016 FINDINGS: Aorto bi-iliac stent graft appears in good position. Bowel gas pattern is normal. Contrast in both renal collecting systems. Foley catheter in place. IMPRESSION: Benign-appearing abdomen.  Aorto bi-iliac stent graft  in place. Electronically Signed   By: Lorriane Shire M.D.   On: 03/12/2016 10:22    Labs:  CBC:  Recent Labs  03/06/16 0955 03/12/16 0934 03/13/16 0500 03/29/16 1016  WBC 8.2 8.4 9.5 10.4  HGB 14.4 12.9* 12.4* 14.1  HCT 42.4 38.2* 37.1* 41.2  PLT 343 280 282 387    COAGS:  Recent Labs  03/06/16 0955 03/12/16 0934 03/29/16 1016  INR 1.11 1.22 1.09  APTT 30 34 31    BMP:  Recent Labs  03/06/16 0955 03/12/16 0934 03/13/16 0500  NA 133* 137 134*  K 4.0 4.0 3.9  CL 100* 108 102  CO2 '25 24 26  '$ GLUCOSE 104* 107* 94  BUN 7 6 <5*  CALCIUM 9.3 8.5* 8.6*  CREATININE 0.65 0.59* 0.68  GFRNONAA >60 >60 >60  GFRAA >60 >60 >60    LIVER FUNCTION TESTS:  Recent Labs  03/06/16 0955  BILITOT 0.6  AST 23  ALT 11*    ALKPHOS 123  PROT 7.4  ALBUMIN 3.8    TUMOR MARKERS: No results for input(s): AFPTM, CEA, CA199, CHROMGRNA in the last 8760 hours.  Assessment and Plan:  Left lung mass +PET Incidentally found with evaluation of AAA + smoker; + wt loss Request for left lung mass biopsy per Dr Servando Snare Risks and Benefits discussed with the patient including, but not limited to bleeding, hemoptysis, respiratory failure requiring intubation, infection, pneumothorax requiring chest tube placement, stroke from air embolism or even death. All of the patient's questions were answered, patient is agreeable to proceed. Consent signed and in chart.   Thank you for this interesting consult.  I greatly enjoyed meeting [name] and look forward to participating in their care.  A copy of this report was sent to the requesting provider on this date.  Electronically Signed: Byrd Rushlow A 03/29/2016, 11:24 AM   I spent a total of  30 Minutes   in face to face in clinical consultation, greater than 50% of which was counseling/coordinating care for left lung mass bx

## 2016-03-29 NOTE — Discharge Instructions (Signed)
Needle Biopsy of Lung, Care After Refer to this sheet in the next few weeks. These instructions provide you with information on caring for yourself after your procedure. Your health care provider may also give you more specific instructions. Your treatment has been planned according to current medical practices, but problems sometimes occur. Call your health care provider if you have any problems or questions after your procedure. WHAT TO EXPECT AFTER THE PROCEDURE  A bandage will be applied over the area where the needle was inserted. You may be asked to apply pressure to the bandage for several minutes to ensure there is minimal bleeding.  In most cases, you can leave when your needle biopsy procedure is completed. Do not drive yourself home. Someone else should take you home.  If you received an IV sedative or general anesthetic, you will be taken to a comfortable place to relax while the medicine wears off.  If you have upcoming travel scheduled, talk to your health care provider about when it is safe to travel by air after the procedure. HOME CARE INSTRUCTIONS  Expect to take it easy for the rest of the day.  Protect the area where you received the needle biopsy by keeping the bandage in place for as long as instructed.  You may feel some mild pain or discomfort in the area, but this should stop in a day or two.  Take medicines only as directed by your health care provider. SEEK MEDICAL CARE IF:   You have pain at the biopsy site that worsens or is not helped by medicine.  You have swelling or drainage at the needle biopsy site.  You have a fever. SEEK IMMEDIATE MEDICAL CARE IF:   You have new or worsening shortness of breath.  You have chest pain.  You are coughing up blood.  You have bleeding that does not stop with pressure or a bandage.  You develop light-headedness or fainting. This information is not intended to replace advice given to you by your health care  provider. Make sure you discuss any questions you have with your health care provider. Document Released: 02/04/2007 Document Revised: 04/30/2014 Document Reviewed: 09/01/2012 Elsevier Interactive Patient Education  2017 [employer].

## 2016-03-29 NOTE — Sedation Documentation (Signed)
Attempted to give report. Nurse unavailable. Will call back

## 2016-04-05 ENCOUNTER — Ambulatory Visit: Payer: BLUE CROSS/BLUE SHIELD | Attending: Cardiothoracic Surgery | Admitting: Physical Therapy

## 2016-04-05 ENCOUNTER — Encounter: Payer: Self-pay | Admitting: *Deleted

## 2016-04-05 ENCOUNTER — Ambulatory Visit (INDEPENDENT_AMBULATORY_CARE_PROVIDER_SITE_OTHER): Payer: No Typology Code available for payment source | Admitting: Cardiothoracic Surgery

## 2016-04-05 ENCOUNTER — Encounter: Payer: Self-pay | Admitting: Cardiothoracic Surgery

## 2016-04-05 ENCOUNTER — Ambulatory Visit
Admission: RE | Admit: 2016-04-05 | Discharge: 2016-04-05 | Disposition: A | Discharge status: Home / Self Care | Payer: BLUE CROSS/BLUE SHIELD | Source: Ambulatory Visit | Attending: Radiation Oncology | Admitting: Radiation Oncology

## 2016-04-05 ENCOUNTER — Encounter: Payer: BLUE CROSS/BLUE SHIELD | Admitting: Cardiothoracic Surgery

## 2016-04-05 VITALS — BP 145/79 | HR 74 | Temp 98.2°F | Resp 18 | Wt 144.6 lb

## 2016-04-05 DIAGNOSIS — F17211 Nicotine dependence, cigarettes, in remission: Secondary | ICD-10-CM | POA: Diagnosis not present

## 2016-04-05 DIAGNOSIS — C3432 Malignant neoplasm of lower lobe, left bronchus or lung: Secondary | ICD-10-CM | POA: Insufficient documentation

## 2016-04-05 DIAGNOSIS — M6281 Muscle weakness (generalized): Secondary | ICD-10-CM

## 2016-04-05 HISTORY — DX: Malignant neoplasm of lower lobe, left bronchus or lung: C34.32

## 2016-04-05 NOTE — Progress Notes (Addendum)
[address]       [place] [address]             [phone]                    William K Corlett [name] [facility] Medical Record #[id number] Date of Birth: 04/25/1955  Referring: [name], MD Primary Care: [name], MD  Chief Complaint:    Left Lower Lobe Lung Cancer and AAA( stented)    History of Present Illness:    William Kales [name] 60 y.o. male is seen in [facility]   today for further evaluation of lung mass and follow up after needle biopsy  . Patient has long history tobacco use, stopped in November  . He had  a stent graft of a large abdominal aneurysm two weeks ago week. PFTs and PET scan were done  for evaluation of his left lung mass. He's radiographic findings were reviewed in multidisciplinary thoracic oncology conference . He is progressing well after his abdominal stent graft.   Needle biopsy of left lung mass has been completed : FINAL DIAGNOSIS Diagnosis Lung, needle/core biopsy(ies), Left lower lobe - ADENOCARCINOMA. - SEE COMMENT. Microscopic Comment  Current Activity/ Functional Status:  Patient is independent with mobility/ambulation, transfers, ADL's, IADL's.   Zubrod Score: At the time of surgery this patient's most appropriate activity status/level should be described as: '[]'$     0    Normal activity, no symptoms '[x]'$     1    Restricted in physical strenuous activity but ambulatory, able to do out light work '[]'$     2    Ambulatory and capable of self care, unable to do work activities, up and about               >50 % of waking hours                              '[]'$     3    Only limited self care, in bed greater than 50% of waking hours '[]'$     4    Completely disabled, no self care, confined to bed or chair '[]'$     5    Moribund   Past Medical History:  Diagnosis Date  . AAA (abdominal aortic aneurysm) ([place])   . History of hepatitis B   . HNP (herniated nucleus pulposus), lumbar    L4 with radiculopathy  . Mass of left lung    . Varicose veins of legs   Mitral insufficiency on echo 02/2016  Past Surgical History:  Procedure Laterality Date  . COLONOSCOPY    . INGUINAL HERNIA REPAIR Left 1975   Left inguinal hernia  . INGUINAL HERNIA REPAIR Right   . LUMBAR LAMINECTOMY  October 22, 2012    Family History  Problem Relation Age of Onset  . Diabetes Mother     Social History   Social History  . Marital status: Married    Spouse name: N/A  . Number of children: N/A  . Years of education: N/A   Occupational History  . Not on file.   Social History Main Topics  . Smoking status: Current Every Day Smoker    Packs/day: 1.00    Types: Cigarettes  . Smokeless tobacco: Never Used  . Alcohol use 7.2 oz/week    12 Cans of beer per week  . 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 Sexual activity: Not on file   Other Topics Concern   Works as school custodian     History  Smoking Status  . Former Smoker  . Packs/day: 1.00  . Types: Cigarettes  . Quit date: 03/05/2016  Smokeless Tobacco  . Never Used    History  Alcohol Use  . 7.2 oz/week  . 12 Cans of beer per week     Allergies  Allergen Reactions  . Tramadol Nausea And Vomiting    No current outpatient prescriptions on file.   No current facility-administered medications for this visit.    Facility-Administered Medications Ordered in Other Visits  Medication Dose Route Frequency Provider Last Rate Last Dose  . fludeoxyglucose F - 18 (FDG) injection 7.5 millicurie  7.5 millicurie Intravenous Once PRN Kalman Jewels, MD            Review of Systems:     Cardiac Review of Systems: Y or N  Chest Pain [n    ]  Resting SOB [ n  ] Exertional SOB  [ y ]  Orthopnea n[  ]   Pedal Edema [ n  ]    Palpitations [n  ] Syncope  [n  ]   Presyncope [  n ]  General Review of Systems: [Y] = yes [  ]=no Constitional: recent weight change [ n ];  Wt loss over the last 3 months [   ] anorexia [  ];  fatigue [ y ]; nausea [  ]; night sweats [  ]; fever [  ]; or chills [  ];          Dental: poor dentition[  ]; Last Dentist visit:   Eye : blurred vision [  ]; diplopia [   ]; vision changes [  ];  Amaurosis fugax[  ]; Resp: cough Blue.Reese  ];  wheezing[ y ];  hemoptysis[  n]; shortness of breath[y  ]; paroxysmal nocturnal dyspnea[  ]; dyspnea on exertion[  ]; or orthopnea[  ];  GI:  gallstones[  ], vomiting[  ];  dysphagia[  ]; melena[  ];  hematochezia [  ]; heartburn[  ];   Hx of  Colonoscopy[ y ]; GU: kidney stones [  ]; hematuria[  ];   dysuria [  ];  nocturia[  ];  history of     obstruction [  ]; urinary frequency [  ]             Skin: rash, swelling[  ];, hair loss[  ];  peripheral edema[  ];  or itching[  ]; Musculosketetal: myalgias[  ];  joint swelling[  ];  joint erythema[  ];  joint pain[  ];  back pain[  ];  Heme/Lymph: bruising[  ];  bleeding[  ];  anemia[  ];  Neuro: TIA[n  ];  headaches[ n ];  stroke[  ];  vertigo[  ];  seizures[  ];   paresthesias[  ];  difficulty walking[  ];  Psych:depression[  ]; anxiety[  ];  Endocrine: diabetes[ n ];  thyroid dysfunction[n  ];  Immunizations: Flu up to date [ n ]; Pneumococcal up to date [n  ];  Other:  Physical Exam: BP (!) 145/79   Pulse 74   Temp 98.2 F (36.8 C)   Resp 18   Wt 144 lb 9.6 oz (65.6 kg)   SpO2 98%   BMI 18.07 kg/m  PHYSICAL EXAMINATION: General appearance: alert, cooperative and appears older than stated age Head: Normocephalic, without obvious abnormality, atraumatic Neck: no adenopathy, no carotid bruit, no JVD, supple, symmetrical, trachea midline and thyroid not enlarged, symmetric, no tenderness/mass/nodules Lymph nodes: Cervical, supraclavicular, and axillary nodes normal. Resp: clear to auscultation bilaterally Back: symmetric, no curvature. ROM normal. No CVA tenderness. Cardio: regular rate and rhythm, S1, S2 normal, no murmur, click, rub or gallop GI: soft, non-tender; bowel sounds normal; no masses,   no organomegaly Extremities: extremities normal, atraumatic, no cyanosis or edema and clubbing of all fingers,  Neurologic: Grossly normal palpable dt and pt pulses patient has significant bruit heard over the abdomen and even radiating into the lower back bilaterally, he has no cardiac murmur.   Diagnostic Studies & Laboratory data:     Recent Radiology Findings:  Nm Pet Image Initial (pi) Skull Base To Thigh  Result Date: 03/07/2016 CLINICAL DATA:  Initial treatment strategy for left lung mass. EXAM: NUCLEAR MEDICINE PET SKULL BASE TO THIGH TECHNIQUE: 7.5 mCi F-18 FDG was injected intravenously. Full-ring PET imaging was performed from the skull base to thigh after the radiotracer. CT data was obtained and used for attenuation correction and anatomic localization. FASTING BLOOD GLUCOSE:  Value: 114 mg/dl COMPARISON:  Chest CT 02/16/2016 FINDINGS: NECK No hypermetabolic lymph nodes in the neck. CHEST No hypermetabolic mediastinal or hilar nodes. The left lower lobe lung mass measures 3.8 x 3.2 cm and is metabolically active with SUV max of 9.3. Extensive pleural calcifications noted in the right lung posteriorly likely due to prior hemothorax or old empyema. Small scattered subpleural pulmonary nodules are stable. Recommend attention on future scans. Stable emphysematous changes and pulmonary scarring. 4 cm ascending aortic aneurysm. ABDOMEN/PELVIS No abnormal hypermetabolic activity within the liver, pancreas, adrenal glands, or spleen. No hypermetabolic lymph nodes in the abdomen or pelvis. New line stable 6 cm infrarenal abdominal aortic aneurysm. SKELETON No focal hypermetabolic activity to suggest skeletal metastasis. IMPRESSION: 1. 3.8 x 3.2 cm superior segment left lower lobe lung mass is hypermetabolic and consistent with primary lung neoplasm. No mediastinal or hilar lymphadenopathy and no findings to suggest metastatic disease. 2. A few small scattered pulmonary nodules are likely benign but  will require surveillance. 3. Stable ascending aortic aneurysm and infrarenal abdominal aortic aneurysm. Electronically Signed   By: Marijo Sanes M.D.   On: 03/07/2016 16:54    I have independently reviewed the above radiology studies  and reviewed the findings with the patient.   Ct Chest W Contrast  Result Date: 02/16/2016 CLINICAL DATA:  Cough. Tobacco dependence. Weight loss. History of abdominal aortic aneurysm. Question lung cancer. EXAM: CT CHEST, ABDOMEN, AND PELVIS WITH CONTRAST TECHNIQUE: Multidetector CT imaging of the chest, abdomen and pelvis was performed following the standard protocol during bolus administration of intravenous contrast. CONTRAST:  129m ISOVUE-300 IOPAMIDOL (ISOVUE-300) INJECTION 61% Creatinine was obtained on site at [facility] [address] Results: Creatinine 0.7 mg/dL. COMPARISON:  Chest radiograph of 12/06/2006.  No prior CTs. FINDINGS: CT CHEST FINDINGS Cardiovascular: Aortic and branch vessel atherosclerosis. Tortuous thoracic aorta. Borderline ascending thoracic aortic aneurysm, 4.0 cm. The proximal descending segment is borderline prominent at 3.4 cm. Cardiomegaly, accentuated by a mild pectus excavatum deformity. No pericardial effusion. No central pulmonary embolism, on this non-dedicated study. Mediastinum/Nodes: No supraclavicular adenopathy. 1.2 cm hypo attenuating left thyroid nodule is nonspecific. No mediastinal or hilar adenopathy. Mild soft tissue thickening anterior to the ascending aorta favored to represent a pericardial recess.  Lungs/Pleura: No pleural fluid. Calcified right-sided pleural plaque. Moderate centrilobular and paraseptal emphysema. Probable secretions in the trachea. Lower lobe predominant bronchial wall thickening. Minimal motion degradation. Inferior right middle lobe pleural or subpleural 5 mm nodule on image 121/series 6. 2 mm medial right apex pulmonary nodule on image 56/series 6. A subpleural left upper lobe  pulmonary nodule measures 5 mm on image 60/series 6. Superior segment left lower lobe lung mass measures 3.8 by 3.2 by 3.1 cm on transverse image 99/series 6 and sagittal image 86. This contacts the left major fissure over an approximately 1.7 cm span. Subpleural posterior left upper lobe 4 mm nodule on image 44/series 6. Musculoskeletal: No acute osseous abnormality. CT ABDOMEN PELVIS FINDINGS Hepatobiliary: Probable hepatic steatosis diffusely. More focal steatosis adjacent the falciform ligament. Scattered tiny low-density liver lesions are too small to characterize. Mild hepatomegaly at 18.9 cm. Normal gallbladder, without biliary ductal dilatation. Pancreas: Normal, without mass or ductal dilatation. Spleen: Normal in size, without focal abnormality. Adrenals/Urinary Tract: Normal adrenal glands. Too small to characterize lesion in the interpolar left kidney is likely a cyst. Normal right kidney, without hydronephrosis. Normal urinary bladder. Stomach/Bowel: Normal stomach, without wall thickening. Normal colon, appendix, and terminal ileum. Normal small bowel. Vascular/Lymphatic: Infrarenal abdominal aortic aneurysm, maximally 5.9 x 6.3 cm on image 150/series 2. Extensive wall thrombus. No periaortic hemorrhage. No extension into the iliacs. Both common iliac arteries are mildly ectatic at 1.3 cm. No abdominopelvic adenopathy. Reproductive: Mild prostatomegaly.  Small left hydrocele. Other: No significant free fluid. No evidence of omental or peritoneal disease. Musculoskeletal: Advanced degenerative disc disease at the L4-5 level. IMPRESSION: 1. Superior segment left lower lobe lung mass, consistent with primary bronchogenic carcinoma. No evidence of thoracic nodal or extra thoracic metastasis. 2. Infrarenal abdominal aortic aneurysm, maximally 6.3 cm. Vascular surgery consultation recommended due to increased risk of rupture for AAA >5.5 cm. This recommendation follows ACR consensus guidelines: White Paper  of the ACR Incidental Findings Committee II on Vascular Findings. J Am Coll Radiol 2013; 10:789-794. These results (impressions 1 and 2) will be called to the ordering clinician or representative by the Radiologist Assistant, and communication documented in the PACS or zVision Dashboard. 3. Coronary artery atherosclerosis. Aortic atherosclerosis. 4. Borderline ascending thoracic aortic aneurysm. 5. Right-sided pleural calcifications, suggesting prior empyema or hemothorax. 6. Probable hepatic steatosis. 7. Prostatomegaly. 8. Bilateral common iliac artery ectasia. Electronically Signed   By: Abigail Miyamoto M.D.   On: 02/16/2016 11:24    I have independently reviewed the above radiology studies  and reviewed the findings with the patient.   Recent Lab Findings: Lab Results  Component Value Date   WBC 8.2 03/06/2016   HGB 14.4 03/06/2016   HCT 42.4 03/06/2016   PLT 343 03/06/2016   GLUCOSE 104 (H) 03/06/2016   ALT 11 (L) 03/06/2016   AST 23 03/06/2016   NA 133 (L) 03/06/2016   K 4.0 03/06/2016   CL 100 (L) 03/06/2016   CREATININE 0.65 03/06/2016   BUN 7 03/06/2016   CO2 25 03/06/2016   INR 1.11 03/06/2016   PFT's FEV1 2.53   68% DLCO 14.57  37%  Conclusions: Although there is airway obstruction and a diffusion defect suggesting emphysema, the absence of overinflation is inconsistent with that diagnosis. In view of the severity of the diffusion defect, studies with exercise would be helpful to evaluate the presence of hypoxemia.   6 Minute Walk Test Results  Patient:  William Kales [name] Date:                03/08/2016   Supplemental O2 during test? NO                                       Baseline                                 End  Time                            1106                                        1112 Heartrate                     70                                            81 Dyspnea                      0                                               0 Fatigue                        0                                              0 O2 sat                          98%                                         99% Blood pressure            150/86                                  158/   Patient ambulated at a NORMAL pace for a total distance of 1176 feet with NO stops.  Ambulation was limited primarily due to N/A  Overall the test was tolerated VERY WELL with no complaints of any discomfort    Assessment / Plan:   Superior segment left lower lobe lung mass, consistent with primary bronchogenic carcinoma. No evidence of thoracic nodal or extra thoracic metastasis. 3.8 x 3.2 cm superior segment left lower lobe lung mass is hypermetabolic and consistent with primary lung neoplasm. No mediastinal or hilar lymphadenopathy and no findings to suggest metastatic disease -  Clinicial stage IB disease (  cT2a,cN0,cM0) I discussed with the patient  in detail the surgical options for treatment  primary carcinoma of the lung . With  low DLCO at rest will proceed with CPX testing . Patient espressed a desire to consider radiation optiona and will discuss mith Dr Sondra Come today before making definite decision concerning resection.  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 However, his lung cancer therapy  is the priority right now. His cardiac condition won't preclude possible lung surgery.  Once his lung cancer is under control he will need a TEE to further evaluate his mitral valve  anatomy and MR severity, and also have ischemic workup as tere is evidence on calcifications in his coronary arteries on chest CT.  For now I would treat medically, he need good BP management, I will start losartan 25 mg po daily and carvedilol 3.125 mg po BID. [name], MD 03/13/2016

## 2016-04-05 NOTE — Progress Notes (Signed)
Radiation Oncology         [phone]) [phone] ________________________________  Initial outpatient Consultation  Name: William Kales [name] MRN: [id number]  Date: 04/05/2016  DOB: Jun 11, 1955  EX:[id number] O, MD  [name], MD   REFERRING PHYSICIAN: [name], MD  DIAGNOSIS:  Clinical stage IB (T2a, No, Mx) non-small cell lung cancer (adenocarcinoma) presenting in the superior segment of his left lower lobe  HISTORY OF PRESENT ILLNESS::William K Alexandr Yaworski. is a 60 y.o. male who is seen out of the courtesy of Dr Lanelle Bal for an opinion concerning radiation therapy as part of management of patient's recently diagnosed adenocarcinoma of the lung presenting in the left lower lung. The patient's problem was picked up on a CT scan of the abdomen for workup of a large abdominal aortic aneurysm.  He was asymptomatic concerning his lung mass. November 15  PET scan was performed which revealed a 3.8 x 3.2 cm metabolically active lesion with an SUV of 9.3. There was no mediastinal or hilar lymphadenopathy appreciated on the patient's PET scan. Patient proceeded to undergo CT-guided biopsy of the mass with pathology significant for moderately differentiated adenocarcinoma. Patient was seen by Dr. Servando Snare and given the patient's medical respiratory may not be an ideal candidate for surgery. The patient is to undergo additional testing next week to see if he would be a candidate for an operation. At this time the patient is hesitant to consider surgery and is exploring his options for treatment. Patient stopped smoking in November of this year.  he is currently on leave from his work. He works as a Freight forwarder of the custodial service, third shift for the airport. He is married and lives in the [place] area.Marland Kitchen  PREVIOUS RADIATION THERAPY: No  PAST MEDICAL HISTORY:  has a past medical history of AAA (abdominal aortic aneurysm) ([place]); History of hepatitis B; HNP (herniated nucleus pulposus),  lumbar; Mass of left lung; and Varicose veins of legs.    PAST SURGICAL HISTORY: Past Surgical History:  Procedure Laterality Date  . ABDOMINAL AORTIC ENDOVASCULAR STENT GRAFT N/A 03/12/2016   Procedure: ABDOMINAL AORTIC ENDOVASCULAR STENT GRAFT;  Surgeon: Conrad Oakley, MD;  Location: [facility];  Service: Vascular;  Laterality: N/A;  . COLONOSCOPY    . INGUINAL HERNIA REPAIR Left 1975   Left inguinal hernia  . INGUINAL HERNIA REPAIR Right   . LUMBAR LAMINECTOMY  October 22, 2012    FAMILY HISTORY: family history includes Diabetes in his mother.  SOCIAL HISTORY:  reports that he quit smoking about 4 weeks ago. His smoking use included Cigarettes. He smoked 1.00 pack per day. He has never used smokeless tobacco. He reports that he drinks about 7.2 oz of alcohol per week . He reports that he uses drugs, including Cocaine and Heroin.  ALLERGIES: Tramadol  MEDICATIONS:  Current Outpatient Prescriptions  Medication Sig Dispense Refill  . carvedilol (COREG) 3.125 MG tablet Take 1 tablet (3.125 mg total) by mouth 2 (two) times daily with a meal. 60 tablet 1  . losartan (COZAAR) 25 MG tablet Take 1 tablet (25 mg total) by mouth daily. 30 tablet 1  . oxyCODONE (OXY IR/ROXICODONE) 5 MG immediate release tablet Take 1 tablet (5 mg total) by mouth every 6 (six) hours as needed for moderate pain. 10 tablet 0   No current facility-administered medications for this encounter.     REVIEW OF SYSTEMS:  A 12 point review of systems is documented in the electronic medical record. This was  obtained by the nursing staff. However, I reviewed this with the patient to discuss relevant findings and make appropriate changes. Patient reports no pain within the chest area cough or hemoptysis. He has some dyspnea with physical exertion. The patient denies any headaches or visual difficulties. His appetite is been good. He denies any weight loss   PHYSICAL EXAM:  Vitals - 1 value per visit 53/61/4431  SYSTOLIC 540    DIASTOLIC 79  Pulse 74  Temperature 98.2  Respirations 18  Weight (lb) 144.6  Height   BMI 18.07  VISIT REPORT   General: Alert and oriented, in no acute distress HEENT: Head is normocephalic. Extraocular movements are intact. Oropharynx is clear. Dentures in place Neck: Neck is supple, no palpable cervical or supraclavicular lymphadenopathy. Heart: Regular in rate and rhythm with no murmurs, rubs, or gallops. Chest: Clear to auscultation bilaterally, with no rhonchi, wheezes, or rales. Abdomen: Soft, nontender, nondistended, with no rigidity or guarding. Extremities: No cyanosis or edema. Patient has significant "spooning" of his fingernails Lymphatics: see Neck Exam Skin: No concerning lesions. Musculoskeletal: symmetric strength and muscle tone throughout. Neurologic: Cranial nerves II through XII are grossly intact. No obvious focalities. Speech is fluent. Coordination is intact. Psychiatric: Judgment and insight are intact. Affect is appropriate.      ECOG = 1  LABORATORY DATA:  Lab Results  Component Value Date   WBC 10.4 03/29/2016   HGB 14.1 03/29/2016   HCT 41.2 03/29/2016   MCV 90.4 03/29/2016   PLT 387 03/29/2016   Lab Results  Component Value Date   NA 134 (L) 03/13/2016   K 3.9 03/13/2016   CL 102 03/13/2016   CO2 26 03/13/2016   GLUCOSE 94 03/13/2016   CREATININE 0.68 03/13/2016   CALCIUM 8.6 (L) 03/13/2016      RADIOGRAPHY: Nm Pet Image Initial (pi) Skull Base To Thigh  Result Date: 03/07/2016 CLINICAL DATA:  Initial treatment strategy for left lung mass. EXAM: NUCLEAR MEDICINE PET SKULL BASE TO THIGH TECHNIQUE: 7.5 mCi F-18 FDG was injected intravenously. Full-ring PET imaging was performed from the skull base to thigh after the radiotracer. CT data was obtained and used for attenuation correction and anatomic localization. FASTING BLOOD GLUCOSE:  Value: 114 mg/dl COMPARISON:  Chest CT 02/16/2016 FINDINGS: NECK No hypermetabolic lymph nodes in the  neck. CHEST No hypermetabolic mediastinal or hilar nodes. The left lower lobe lung mass measures 3.8 x 3.2 cm and is metabolically active with SUV max of 9.3. Extensive pleural calcifications noted in the right lung posteriorly likely due to prior hemothorax or old empyema. Small scattered subpleural pulmonary nodules are stable. Recommend attention on future scans. Stable emphysematous changes and pulmonary scarring. 4 cm ascending aortic aneurysm. ABDOMEN/PELVIS No abnormal hypermetabolic activity within the liver, pancreas, adrenal glands, or spleen. No hypermetabolic lymph nodes in the abdomen or pelvis. New line stable 6 cm infrarenal abdominal aortic aneurysm. SKELETON No focal hypermetabolic activity to suggest skeletal metastasis. IMPRESSION: 1. 3.8 x 3.2 cm superior segment left lower lobe lung mass is hypermetabolic and consistent with primary lung neoplasm. No mediastinal or hilar lymphadenopathy and no findings to suggest metastatic disease. 2. A few small scattered pulmonary nodules are likely benign but will require surveillance. 3. Stable ascending aortic aneurysm and infrarenal abdominal aortic aneurysm. Electronically Signed   By: Marijo Sanes M.D.   On: 03/07/2016 16:54   Ct Biopsy  Result Date: 03/29/2016 INDICATION: 60 year old male with a history of left lung mass EXAM: CT BIOPSY MEDICATIONS: None.  ANESTHESIA/SEDATION: Moderate (conscious) sedation was employed during this procedure. A total of Versed 1.5 mg and Fentanyl 75 mcg was administered intravenously. Moderate Sedation Time: A left minutes. The patient's level of consciousness and vital signs were monitored continuously by radiology nursing throughout the procedure under my direct supervision. FLUOROSCOPY TIME:  CT COMPLICATIONS: None PROCEDURE: The procedure, risks, benefits, and alternatives were explained to the patient and the patient's family. Specific risks that were addressed included bleeding, infection, pneumothorax, need  for further procedure including chest tube placement, chance of delayed pneumothorax or hemorrhage, hemoptysis, nondiagnostic sample, cardiopulmonary collapse, death. Questions regarding the procedure were encouraged and answered. The patient understands and consents to the procedure. Patient was positioned in the prone position on the CT gantry table and a scout CT of the chest was performed for planning purposes. Once angle of approach was determined, the skin and subcutaneous tissues this scan was prepped and draped in the usual sterile fashion, and a sterile drape was applied covering the operative field. A sterile gown and sterile gloves were used for the procedure. Local anesthesia was provided with 1% Lidocaine. The skin and subcutaneous tissues were infiltrated 1% lidocaine for local anesthesia, and a small stab incision was made with an 11 blade scalpel. Using CT guidance, a 17 gauge trocar needle was advanced into the left lower lobetarget. After confirmation of the tip, separate 18 gauge core biopsies were performed. These were placed into solution for transportation to the lab. Biosentry Device was deployed. A final CT image was performed. Patient tolerated the procedure well and remained hemodynamically stable throughout. No complications were encountered and no significant blood loss was encounter IMPRESSION: Status post CT-guided biopsy of left lower lobe lung mass. Tissue specimen sent to pathology for complete histopathologic analysis. Signed, Dulcy Fanny. Earleen Newport, DO [facility] Radiology Electronically Signed   By: Corrie Mckusick D.O.   On: 03/29/2016 12:43   Dg Chest Port 1 View  Result Date: 03/29/2016 CLINICAL DATA:  Post left lung biopsy. EXAM: PORTABLE CHEST 1 VIEW COMPARISON:  Chest CT 03/29/2016, chest x-ray 03/12/2016 FINDINGS: Mass lesion in the superior segment left lower lobe overlying the left hilum. Negative for pneumothorax or effusion  post biopsy. No pulmonary hemorrhage or infiltrate. Underlying COPD. IMPRESSION: No acute complication post left lung mass biopsy. Electronically Signed   By: Franchot Gallo M.D.   On: 03/29/2016 13:40   Dg Chest Port 1 View  Result Date: 03/12/2016 CLINICAL DATA:  Central line placement. Endovascular repair of abdominal aortic aneurysm. EXAM: PORTABLE CHEST 1 VIEW COMPARISON:  Chest x-ray dated 12/05/2016 and chest CT dated 02/16/2016 FINDINGS: Right central catheter tip appears in good position at the level of the azygos vein overlying the superior vena cava. Heart size and pulmonary vascularity are normal. Left perihilar lung mass is not well seen on this image. No effusions.  No pneumothorax. IMPRESSION: Central line in good position. No pneumothorax. Left perihilar lung mass as previously demonstrated. Electronically Signed   By: Lorriane Shire M.D.   On: 03/12/2016 10:21   Dg Abd Portable 1v  Result Date: 03/12/2016 CLINICAL DATA:  Abdominal aortic aneurysm, status post endovascular repair EXAM: PORTABLE ABDOMEN - 1 VIEW COMPARISON:  CT scan dated 02/24/2016 FINDINGS: Aorto bi-iliac stent graft appears in good position. Bowel gas pattern is normal. Contrast in both renal collecting systems. Foley catheter in place. IMPRESSION: Benign-appearing abdomen.  Aorto bi-iliac stent graft in place. Electronically Signed   By: Lorriane Shire M.D.  On: 03/12/2016 10:22      IMPRESSION:  Clinical stage IB (T2a, No, Mx) non-small cell lung cancer.  I discussed with the patient that his best chance for cure would be surgery.  Also discussed with the patient that he would be a candidate for a definitive course of radiation therapy but not sure we'll be able to perform SBRT given the size of the lesion area but certainly a hypo-fractionated accelerated course of radiation therapy would be possible for his treatment. I discussed the course of treatment side effects and potential toxicities of radiation therapy in  this situation with the patient. he will discuss treatment approaches further with his wife over the weekend and he will decide if he wishes to pursue additional pulmonary function testing to determine whether he would be a good surgical candidate.   PLAN: I have asked the patient to call me once he has a made a decision concerning pathways for treatment management. The patient will in the meantime be scheduled for an MRI of the brain to complete his staging workup.     ------------------------------------------------  Blair Promise, PhD, MD

## 2016-04-05 NOTE — Progress Notes (Signed)
[facility] Clinical Social Work  Clinical Social Work met with patient at [facility] appointment to offer support and assess for psychosocial needs. [name] shared he has no concerns at this time and scored a 0 on his distress thermometer.  His spouse is his primary support and he has a son and daughter that live nearby.  Clinical Social Work briefly discussed Clinical Social Work role and [employer] support programs/services.  Clinical Social Work encouraged patient to call with any additional questions or concerns.   Polo Riley, MSW, LCSW, OSW-C Clinical Social Worker [facility] [phone]

## 2016-04-05 NOTE — Therapy (Signed)
[place], [place], [address] Phone: [phone]   Fax:  [phone]  Physical Therapy Evaluation  Patient Details  Name: [name] [name] MRN: [id number] Date of Birth: [birth year] Referring Provider: Dr. Lanelle Bal  Encounter Date: 04/05/2016      PT End of Session - 04/05/16 1820    Visit Number 1   Number of Visits 1   PT Start Time 1625   PT Stop Time 1640   PT Time Calculation (min) 15 min   Activity Tolerance Patient tolerated treatment well   Behavior During Therapy [facility] for tasks assessed/performed      Past Medical History:  Diagnosis Date  . AAA (abdominal aortic aneurysm) ([place])   . History of hepatitis B   . HNP (herniated nucleus pulposus), lumbar    L4 with radiculopathy  . Mass of left lung   . Varicose veins of legs     Past Surgical History:  Procedure Laterality Date  . ABDOMINAL AORTIC ENDOVASCULAR STENT GRAFT N/A 03/12/2016   Procedure: ABDOMINAL AORTIC ENDOVASCULAR STENT GRAFT;  Surgeon: Conrad Lima, MD;  Location: [facility];  Service: Vascular;  Laterality: N/A;  . COLONOSCOPY    . INGUINAL HERNIA REPAIR Left 1975   Left inguinal hernia  . INGUINAL HERNIA REPAIR Right   . LUMBAR LAMINECTOMY  October 22, 2012    There were no vitals filed for this visit.       Subjective Assessment - 04/05/16 1810    Subjective Just had surgery for aneurysm 03/12/16; has been recovering from that.   Pertinent History Incidental finding of lung mass when being worked up for abdominal aortic aneurysm, which was repaired 03/12/16. Diagnosis is left lower lobe adenocarcinoma., early stage.  Expected to have resection.  Current smoker; h/o ETOH abuse, polysubstance abuse; hepatitis B; L4 HNP; remote inguinal hernia repairs lt. and Rt.; lumbar laminectomy 2014.   Patient Stated Goals get info from all clinic providers   Currently in Pain? No/denies            [facility] PT Assessment -  04/05/16 0001      Assessment   Medical Diagnosis early stage left lower lobe adenocarcinoma   Referring Provider Dr. Lanelle Bal   Onset Date/Surgical Date 02/16/16   Prior Therapy none     Precautions   Precautions Other (comment)   Precaution Comments cancer precautions     Restrictions   Weight Bearing Restrictions No     Balance Screen   Has the patient fallen in the past 6 months No   Has the patient had a decrease in activity level because of a fear of falling?  No   Is the patient reluctant to leave their home because of a fear of falling?  No     Home Social worker Private residence   Living Arrangements Spouse/significant other;Children  wife and son   Type of [place] Two level     Prior Function   Level of Independence Independent   Leisure no regular exercise, particularly since AAA surgery, except waking dog     Cognition   Overall Cognitive Status Within Functional Limits for tasks assessed     Observation/Other Assessments   Observations very tall, very thin man     Functional Tests   Functional tests Sit to Stand     Sit to Stand   Comments 9 times in 30 seconds, below average  for age  mild dyspnea following     Posture/Postural Control   Posture/Postural Control No significant limitations     ROM / Strength   AROM / PROM / Strength AROM     AROM   Overall AROM  Within functional limits for tasks performed   Overall AROM Comments standing trunk AROM     Ambulation/Gait   Ambulation/Gait Yes   Ambulation/Gait Assistance 7: Independent     Balance   Balance Assessed Yes     Dynamic Standing Balance   Dynamic Standing - Comments reaches forward 9 inches in standing, below average for age                           PT Education - 04/05/16 1819    Education provided Yes   Education Details cough splinting, energy conservation, walking, Cure article on staying active, posture,  breathing, PT info   Person(s) Educated Patient   Methods Explanation;Handout   Comprehension Verbalized understanding               Lung Clinic Goals - 04/05/16 1824      Patient will be able to verbalize understanding of the benefit of exercise to decrease fatigue.   Status Achieved     Patient will be able to verbalize the importance of posture.   Status Achieved     Patient will be able to demonstrate diaphragmatic breathing for improved lung function.   Status Achieved     Patient will be able to verbalize understanding of the role of physical therapy to prevent functional decline and who to contact if physical therapy is needed.   Status Achieved             Plan - 04/05/16 1820    Clinical Impression Statement Pt.'s status was discussed at multidisciplinary clinic prior to assessment.  60 year old male with h/o of substance abuse and smoking, now with diagnosis of left lower lobe adenocarcinoma, early stage.  Expected to have resection.  He has decreased strength/endurance on 30 second sit to stand and decreased forward reach in standing.    Also h/o hepatitis B, L4 HNP, and lumbar laminectomy 2014.   Rehab Potential Good   PT Frequency One time visit   PT Treatment/Interventions Patient/family education   PT Next Visit Plan None at this time   PT Home Exercise Plan walking or other cardiovascular exercise; breathing exercise   Consulted and Agree with Plan of Care Patient      Patient will benefit from skilled therapeutic intervention in order to improve the following deficits and impairments:  Cardiopulmonary status limiting activity, Decreased strength  Visit Diagnosis: Malignant neoplasm of lower lobe of left lung (HCC) - Plan: PT plan of care cert/re-cert  Muscle weakness (generalized) - Plan: PT plan of care cert/re-cert     Problem List Patient Active Problem List   Diagnosis Date Noted  . Malignant neoplasm of lower lobe of left lung ([place])  04/05/2016  . AAA (abdominal aortic aneurysm) ([place]) 03/12/2016  . AAA (abdominal aortic aneurysm) without rupture ([place]) 02/04/2014    Lauria Depoy 04/05/2016, 6:26 PM  [place], [place], [address] Phone: [phone]   Fax:  [phone]  Name: [name] [name] MRN: [id number] Date of Birth: 07/19/1955  Serafina Royals, PT 04/05/16 6:26 PM

## 2016-04-06 ENCOUNTER — Encounter: Payer: Self-pay | Admitting: Cardiothoracic Surgery

## 2016-04-06 ENCOUNTER — Encounter: Payer: Self-pay | Admitting: *Deleted

## 2016-04-06 ENCOUNTER — Other Ambulatory Visit: Payer: Self-pay | Admitting: *Deleted

## 2016-04-06 DIAGNOSIS — C3432 Malignant neoplasm of lower lobe, left bronchus or lung: Secondary | ICD-10-CM

## 2016-04-06 DIAGNOSIS — I340 Nonrheumatic mitral (valve) insufficiency: Secondary | ICD-10-CM | POA: Insufficient documentation

## 2016-04-06 HISTORY — DX: Nonrheumatic mitral (valve) insufficiency: I34.0

## 2016-04-06 NOTE — Progress Notes (Signed)
Oncology Nurse Navigator Documentation  Oncology Nurse Navigator Flowsheets 04/06/2016  Navigator Location [facility]  Navigator Encounter Type Clinic/MDC/I spoke with Mr. Hanak yesterday at [facility].  Gave and explained information on lung cancer, smoking cessation, and next steps.  He is set up for CPX and follow up with Dr. Servando Snare on 05/04/15.  He will need more smoking cessation education.    Abnormal Finding Date 02/16/2016  Confirmed Diagnosis Date 03/29/2016  Multidisiplinary Clinic Date 04/05/2016  Patient Visit Type MedOnc  Treatment Phase Pre-Tx/Tx Discussion  Barriers/Navigation Needs Education  Education Newly Diagnosed Cancer Education;Smoking cessation;Other  Interventions Education  Acuity Level 1  Time Spent with Patient 15

## 2016-04-09 NOTE — Addendum Note (Signed)
Encounter addended by: Gery Pray, MD on: 04/09/2016  5:09 PM<BR>    Actions taken: Order list changed, Diagnosis association updated, Visit diagnoses modified

## 2016-04-10 ENCOUNTER — Telehealth: Payer: Self-pay | Admitting: *Deleted

## 2016-04-10 DIAGNOSIS — I714 Abdominal aortic aneurysm, without rupture: Secondary | ICD-10-CM | POA: Diagnosis not present

## 2016-04-10 DIAGNOSIS — M5127 Other intervertebral disc displacement, lumbosacral region: Secondary | ICD-10-CM | POA: Diagnosis not present

## 2016-04-10 NOTE — Telephone Encounter (Signed)
CALLED PATIENT TO INFORM OF MRI FOR 04-14-16 - ARRIVAL TIME - 8:45 AM @ [facility] AND HIS FU VISIT WITH DR. KINARD ON 04-25-16 @ 2 PM, SPOKE WITH PATIENT AND HE IS AWARE OF THESE APPTS.

## 2016-04-11 ENCOUNTER — Telehealth: Payer: Self-pay | Admitting: *Deleted

## 2016-04-11 ENCOUNTER — Ambulatory Visit (HOSPITAL_COMMUNITY): Payer: BLUE CROSS/BLUE SHIELD

## 2016-04-11 NOTE — Telephone Encounter (Signed)
Oncology Nurse Navigator Documentation  Oncology Nurse Navigator Flowsheets 04/11/2016  Navigator Location [facility]  Navigator Encounter Type Telephone  Telephone Outgoing Call/I followed up with Dr. Sondra Come regarding Mr. Isenberg appts.  He would like to see Mr. Anderle after he sees Dr. Servando Snare.  Patient is scheduled for CPX on 04/27/16, to see Dr. Servando Snare on 05/03/16, and then Dr. Sondra Come on 03/09/17.  Wife verbalized understanding of appts  Treatment Phase Pre-Tx/Tx Discussion  Barriers/Navigation Needs Coordination of Care;Education  Education Other  Interventions Coordination of Care  Coordination of Care Appts  Acuity Level 2  Acuity Level 2 Assistance expediting appointments  Time Spent with Patient 30

## 2016-04-12 ENCOUNTER — Encounter: Payer: Self-pay | Admitting: Vascular Surgery

## 2016-04-13 ENCOUNTER — Ambulatory Visit (HOSPITAL_COMMUNITY)
Admission: RE | Admit: 2016-04-13 | Discharge: 2016-04-13 | Disposition: A | Discharge status: Home / Self Care | Payer: BLUE CROSS/BLUE SHIELD | Source: Ambulatory Visit | Attending: Vascular Surgery | Admitting: Vascular Surgery

## 2016-04-13 DIAGNOSIS — M5136 Other intervertebral disc degeneration, lumbar region: Secondary | ICD-10-CM | POA: Insufficient documentation

## 2016-04-13 DIAGNOSIS — H900 Conductive hearing loss, bilateral: Secondary | ICD-10-CM | POA: Diagnosis not present

## 2016-04-13 DIAGNOSIS — Z48812 Encounter for surgical aftercare following surgery on the circulatory system: Secondary | ICD-10-CM | POA: Diagnosis not present

## 2016-04-13 DIAGNOSIS — N400 Enlarged prostate without lower urinary tract symptoms: Secondary | ICD-10-CM | POA: Insufficient documentation

## 2016-04-13 DIAGNOSIS — I714 Abdominal aortic aneurysm, without rupture, unspecified: Secondary | ICD-10-CM

## 2016-04-13 DIAGNOSIS — Z9689 Presence of other specified functional implants: Secondary | ICD-10-CM | POA: Diagnosis not present

## 2016-04-13 DIAGNOSIS — H6123 Impacted cerumen, bilateral: Secondary | ICD-10-CM | POA: Diagnosis not present

## 2016-04-13 DIAGNOSIS — R918 Other nonspecific abnormal finding of lung field: Secondary | ICD-10-CM | POA: Diagnosis not present

## 2016-04-13 DIAGNOSIS — R531 Weakness: Secondary | ICD-10-CM | POA: Diagnosis not present

## 2016-04-13 MED ORDER — IOPAMIDOL (ISOVUE-370) INJECTION 76%
INTRAVENOUS | Status: AC
  Filled 2016-04-13: qty 100

## 2016-04-14 ENCOUNTER — Ambulatory Visit (HOSPITAL_COMMUNITY)
Admission: RE | Admit: 2016-04-14 | Discharge: 2016-04-14 | Disposition: A | Discharge status: Home / Self Care | Payer: BLUE CROSS/BLUE SHIELD | Source: Ambulatory Visit | Attending: Radiation Oncology | Admitting: Radiation Oncology

## 2016-04-14 DIAGNOSIS — C3490 Malignant neoplasm of unspecified part of unspecified bronchus or lung: Secondary | ICD-10-CM | POA: Diagnosis not present

## 2016-04-14 DIAGNOSIS — C3432 Malignant neoplasm of lower lobe, left bronchus or lung: Secondary | ICD-10-CM

## 2016-04-14 MED ORDER — GADOBENATE DIMEGLUMINE 529 MG/ML IV SOLN
13.0000 mL | Freq: Once | INTRAVENOUS | Status: AC | PRN
  Administered 2016-04-14: 13 mL via INTRAVENOUS

## 2016-04-17 DIAGNOSIS — H900 Conductive hearing loss, bilateral: Secondary | ICD-10-CM | POA: Diagnosis not present

## 2016-04-17 DIAGNOSIS — H6123 Impacted cerumen, bilateral: Secondary | ICD-10-CM | POA: Diagnosis not present

## 2016-04-18 ENCOUNTER — Encounter: Payer: BLUE CROSS/BLUE SHIELD | Admitting: Vascular Surgery

## 2016-04-24 NOTE — Progress Notes (Signed)
    Post-operative EVAR   History of Present Illness  [name]. is a 61 y.o. male who presents post-op s/p EVAR (Date: 03/12/16).   Most recent CTA (Date: 04/13/16) demonstrates: no endoleak and slightly decreased sac size.  The patient has had no back or abdominal pain.  His recent tissue bx was consistent with lung cancer.  Current Outpatient Prescriptions  Medication Sig Dispense Refill  . carvedilol (COREG) 3.125 MG tablet Take 1 tablet (3.125 mg total) by mouth 2 (two) times daily with a meal. 60 tablet 1  . losartan (COZAAR) 25 MG tablet Take 1 tablet (25 mg total) by mouth daily. 30 tablet 1  . oxyCODONE (OXY IR/ROXICODONE) 5 MG immediate release tablet Take 1 tablet (5 mg total) by mouth every 6 (six) hours as needed for moderate pain. 10 tablet 0   No current facility-administered medications for this visit.     For VQI Use Only  PRE-ADM LIVING: Home  AMB STATUS: Ambulatory  Physical Examination  There were no vitals filed for this visit.  Vascular: Vessel Right Left  Aorta Non-palpable N/A  Femoral Palpable Palpable  Popliteal Non-palpable Non-palpable  PT Palpable Palpable  DP Palpable Palpable   Gastrointestinal: soft, NTND, -G/R, - HSM, - masses, - CVAT B  Non-Invasive Vascular Imaging  CTA Abd & pelvis (Date: 04/13/16) VASCULAR  1. Interval infrarenal bifurcated stent graft placement with no endoleak, stable 6.6 cm native sac diameter.  NON-VASCULAR   1. Left lung mass as previously identified.  Based on my review of this patient's CTA, he has a patent endograft with good infrarenal position and no evidence of limb dysfunction.  The R RA appears open today.   Medical Decision Making  William HOWES [name] is a 61 y.o. male who presents s/p EVAR, stage IB lung CA    Pt is asymptomatic with dcreaseing sac size.  I discussed with the patient the importance of surveillance of the endograft.  The next endograft duplex will be scheduled  for 6 months.  The next CT will be scheduled for 12 months.  The patient will follow up with [place] in 6 months with these studies.  In regards to work, given his lung cancer work-up and likely soon to be scheduled lung resection, I think this patient is best kept out of work for now until he has fully recovered from his lung surgery.  Thank you for allowing [place] to participate in this patient's care.  Adele Barthel, MD, FACS [facility] Office: [phone] Pager: [phone]

## 2016-04-25 ENCOUNTER — Ambulatory Visit: Payer: Self-pay | Admitting: Radiation Oncology

## 2016-04-25 ENCOUNTER — Other Ambulatory Visit (HOSPITAL_COMMUNITY): Payer: Self-pay | Admitting: *Deleted

## 2016-04-25 ENCOUNTER — Ambulatory Visit (HOSPITAL_COMMUNITY): Payer: BLUE CROSS/BLUE SHIELD | Attending: Cardiothoracic Surgery

## 2016-04-25 ENCOUNTER — Other Ambulatory Visit: Payer: Self-pay | Admitting: Otolaryngology

## 2016-04-25 DIAGNOSIS — J449 Chronic obstructive pulmonary disease, unspecified: Secondary | ICD-10-CM | POA: Insufficient documentation

## 2016-04-25 DIAGNOSIS — R918 Other nonspecific abnormal finding of lung field: Secondary | ICD-10-CM | POA: Insufficient documentation

## 2016-04-25 DIAGNOSIS — C3432 Malignant neoplasm of lower lobe, left bronchus or lung: Secondary | ICD-10-CM

## 2016-04-27 ENCOUNTER — Encounter: Payer: Self-pay | Admitting: Vascular Surgery

## 2016-04-27 ENCOUNTER — Ambulatory Visit (INDEPENDENT_AMBULATORY_CARE_PROVIDER_SITE_OTHER): Payer: Self-pay | Admitting: Vascular Surgery

## 2016-04-27 ENCOUNTER — Encounter (HOSPITAL_COMMUNITY): Payer: BLUE CROSS/BLUE SHIELD

## 2016-04-27 VITALS — BP 123/73 | HR 87 | Temp 97.5°F | Ht 75.0 in | Wt 147.0 lb

## 2016-04-27 DIAGNOSIS — I714 Abdominal aortic aneurysm, without rupture, unspecified: Secondary | ICD-10-CM

## 2016-04-27 MED ORDER — LOSARTAN POTASSIUM 25 MG PO TABS: 25.0000 mg | ORAL_TABLET | Freq: Every day | ORAL | 1 refills | Status: DC

## 2016-04-27 MED ORDER — CARVEDILOL 3.125 MG PO TABS: 3.1250 mg | ORAL_TABLET | Freq: Two times a day (BID) | ORAL | 1 refills | Status: DC

## 2016-04-27 NOTE — Progress Notes (Signed)
Chart reviewed by Dr Therisa Doyne, needs to be done at [facility] due to recent dx lung CA and VATS.

## 2016-04-30 ENCOUNTER — Other Ambulatory Visit: Payer: Self-pay

## 2016-04-30 DIAGNOSIS — I714 Abdominal aortic aneurysm, without rupture, unspecified: Secondary | ICD-10-CM

## 2016-05-01 ENCOUNTER — Encounter (HOSPITAL_COMMUNITY): Payer: BLUE CROSS/BLUE SHIELD

## 2016-05-01 ENCOUNTER — Telehealth: Payer: Self-pay | Admitting: *Deleted

## 2016-05-01 NOTE — Telephone Encounter (Signed)
Oncology Nurse Navigator Documentation  Oncology Nurse Navigator Flowsheets 05/01/2016  Navigator Location [facility]  Navigator Encounter Type Telephone/I called Mr. Kentner to follow up with him today.  He is doing well.  I reminded him of his appt with Dr. Servando Snare on 05/03/16.  I asked about his smoking cessation. He states he has quit.  I listened as he explained.  I stated he may have the desire to smoke again but to continue his smoke free lifestyle.  I also sent him a certificate of quitting.  I encouraged his efforts and asked that he call me if needed.    Telephone Outgoing Call  Treatment Phase Pre-Tx/Tx Discussion  Barriers/Navigation Needs Education  Education Smoking cessation  Interventions Education  Education Method Verbal  Acuity Level 2  Acuity Level 2 Educational needs  Time Spent with Patient 30

## 2016-05-03 ENCOUNTER — Encounter: Payer: Self-pay | Admitting: Cardiothoracic Surgery

## 2016-05-03 ENCOUNTER — Ambulatory Visit (INDEPENDENT_AMBULATORY_CARE_PROVIDER_SITE_OTHER): Payer: BLUE CROSS/BLUE SHIELD | Admitting: Cardiothoracic Surgery

## 2016-05-03 VITALS — BP 129/73 | HR 88 | Resp 20 | Ht 75.0 in | Wt 147.0 lb

## 2016-05-03 DIAGNOSIS — C3432 Malignant neoplasm of lower lobe, left bronchus or lung: Secondary | ICD-10-CM

## 2016-05-03 DIAGNOSIS — J984 Other disorders of lung: Secondary | ICD-10-CM

## 2016-05-03 DIAGNOSIS — R0600 Dyspnea, unspecified: Secondary | ICD-10-CM | POA: Diagnosis not present

## 2016-05-03 NOTE — Patient Instructions (Signed)
Lung Cancer Lung cancer occurs when abnormal cells in the lung grow out of control and form a mass (tumor). There are several types of lung cancer. The two most common types are:  Non-small cell. In this type of lung cancer, abnormal cells are larger and grow more slowly than those of small cell lung cancer.  Small cell. In this type of lung cancer, abnormal cells are smaller than those of non-small cell lung cancer. Small cell lung cancer gets worse faster than non-small cell lung cancer.  What are the causes? The leading cause of lung cancer is smoking tobacco. The second leading cause is radon exposure. What increases the risk?  Smoking tobacco.  Exposure to secondhand tobacco smoke.  Exposure to radon gas.  Exposure to asbestos.  Exposure to arsenic in drinking water.  Air pollution.  Family or personal history of lung cancer.  Lung radiation therapy.  Being older than 65 years. What are the signs or symptoms? In the early stages, symptoms may not be present. As the cancer progresses, symptoms may include:  A lasting cough, possibly with blood.  Fatigue.  Unexplained weight loss.  Shortness of breath.  Wheezing.  Chest pain.  Loss of appetite.  Symptoms of advanced lung cancer include:  Hoarseness.  Bone or joint pain.  Weakness.  Nail problems.  Face or arm swelling.  Paralysis of the face.  Drooping eyelids.  How is this diagnosed? Lung cancer can be identified with a physical exam and with tests such as:  A chest X-ray.  A CT scan.  Blood tests.  A biopsy.  After a diagnosis is made, you will have more tests to determine the stage of the cancer. The stages of non-small cell lung cancer are:  Stage 0, also called carcinoma in situ. At this stage, abnormal cells are found in the inner lining of your lung or lungs.  Stage I. At this stage, abnormal cells have grown into a tumor that is no larger than 5 cm across. The cancer has entered  the deeper lung tissue but has not yet entered the lymph nodes or other parts of the body.  Stage II. At this stage, the tumor is 7 cm across or smaller and has entered nearby lymph nodes. Or, the tumor is 5 cm across or smaller and has invaded surrounding tissue but is not found in nearby lymph nodes. There may be more than one tumor present.  Stage III. At this stage, the tumor may be any size. There may be more than one tumor in the lungs. The cancer cells have spread to the lymph nodes and possibly to other organs.  Stage IV. At this stage, there are tumors in both lungs and the cancer has spread to other areas of the body.  The stages of small cell lung cancer are:  Limited. At this stage, the cancer is found only on one side of the chest.  Extensive. At this stage, the cancer is in the lungs and in tissues on the other side of the chest. The cancer has spread to other organs or is found in the fluid between the layers of your lungs.  How is this treated? Depending on the type and stage of your lung cancer, you may be treated with:  Surgery. This is done to remove a tumor.  Radiation therapy. This treatment destroys cancer cells using X-rays or other types of radiation.  Chemotherapy. This treatment uses medicines to destroy cancer cells.  Targeted therapy. This treatment   instead of all cells as other therapies do. You may also have a combination of treatments. Follow these instructions at home:  Do not use any tobacco products. This includes cigarettes, chewing tobacco, and electronic cigarettes. If you need help quitting, ask your health care provider.  Take medicines only as directed by your health care provider.  Eat a healthy diet. Work with a dietitian to make sure you are getting the nutrition you need.  Consider joining a support group or seeking counseling to help you cope with the stress of having lung cancer.  Let your cancer specialist  (oncologist) know if you are admitted to the hospital.  Keep all follow-up visits as directed by your health care provider. This is important. Contact a health care provider if:  You lose weight without trying.  You have a persistent cough and wheezing.  You feel short of breath.  You tire easily.  You experience bone or joint pain.  You have difficulty swallowing.  You feel hoarse or notice your voice changing.  Your pain medicine is not helping. Get help right away if:  You cough up blood.  You have new breathing problems.  You develop chest pain.  You develop swelling in:  One or both ankles or legs.  Your face, neck, or arms.  You are confused.  You experience paralysis in your face or a drooping eyelid. This information is not intended to replace advice given to you by your health care provider. Make sure you discuss any questions you have with your health care provider. Document Released: 07/16/2000 Document Revised: 09/15/2015 Document Reviewed: 08/13/2013 Elsevier Interactive Patient Education  2017 [place].  Lung Resection, Care After Introduction Refer to this sheet in the next few weeks. These instructions provide you with information on caring for yourself after your procedure. Your health care provider may also give you more specific instructions. Your treatment has been planned according to current medical practices, but problems sometimes occur. Call your health care provider if you have any problems or questions after your procedure. What can I expect after the procedure? After your procedure, it is typical to have the following:  You may feel pain in your chest and throat.  Patients may sometimes shiver or feel nauseous during recovery. Follow these instructions at home:  You may resume a normal diet and activities as directed by your health care provider.  Do not use any tobacco products, including cigarettes, chewing tobacco, or  electronic cigarettes. If you need help quitting, ask your health care provider.  There are many different ways to close and cover an incision, including stitches, skin glue, and adhesive strips. Follow your health care provider's instructions on:  Incision care.  Bandage (dressing) changes and removal.  Incision closure removal.  Take medicines only as directed by your health care provider.  Keep all follow-up visits as directed by your health care provider. This is important.  Try to breathe deeply and cough as directed. Holding a pillow firmly over your ribs may help with discomfort.  If you were given an incentive spirometer in the hospital, continue to use it as directed by your health care provider.  Walk as directed by your health care provider.  You may take a shower and gently wash the area of your incision with water and soap as directed by your health care provider. Do not use anything else to clean your incision except as directed by your health care provider. Do not take baths, swim, or use  a hot tub until your health care provider approves. Contact a health care provider if:  You notice redness, swelling, or increasing pain at the incision site.  You are bleeding at the incision site.  You see pus coming from the incision site.  You notice a bad smell coming from the incision site or bandage.  Your incision breaks open.  You cough up blood or pus, or you develop a cough that produces bad-smelling sputum.  You have pain or swelling in your legs.  You have increasing pain that is not controlled with medicine.  You have trouble managing any of the tubes that have been left in place after surgery.  You have fever or chills. Get help right away if:  You have chest pain or an irregular or rapid heartbeat.  You have dizzy episodes or faint.  You have shortness of breath or difficulty breathing.  You have persistent nausea or vomiting.  You have a rash. This  information is not intended to replace advice given to you by your health care provider. Make sure you discuss any questions you have with your health care provider. Document Released: 10/27/2004 Document Revised: 09/15/2015 Document Reviewed: 05/29/2013  2017 Elsevier  Lung Resection Introduction A lung resection is a procedure to remove part or all of a lung. When an entire lung is removed, the procedure is called a pneumonectomy. When only part of a lung is removed, the procedure is called a lobectomy. A lung resection is typically done to get rid of a tumor or cancer, but it may be done to treat other conditions. This procedure can help relieve some or all of your symptoms and can also help keep the problem from getting worse. Lung resection may provide the best chance for curing your disease. However, the procedure may not necessarily cure lung cancer if that is the problem. Tell a health care provider about:  Any allergies you have.  All medicines you are taking, including vitamins, herbs, eye drops, creams, and over-the-counter medicines.  Any problems you or family members have had with anesthetic medicines.  Any blood disorders you have.  Any surgeries you have had.  Any medical conditions you have. What are the risks? Generally, lung resection is a safe procedure. However, problems can occur and include:  Excessive bleeding.  Infection.  Inability to breathe without a ventilator.  Persistent shortness of breath.  Heart problems, including abnormal rhythms and a risk of heart attack or heart failure.  Blood clots.  Injury to a blood vessel.  Injury to a nerve.  Failure to heal properly.  Stroke.  Bronchopleural fistula. This is a small hole between one of the main breathing tubes (bronchus) and the lining of the lungs. This is rare.  Reaction to anesthesia. What happens before the procedure? You may have tests done before the procedure, including:  Blood  tests.  Urine tests.  X-rays.  Other imaging tests (such as CT scans, MRI scans, and PET scans). These tests are done to find the exact size and location of the condition being treated with this surgery.  Pulmonary function tests. These are breathing tests to assess the function of your lungs before surgery and to decide how to best help your breathing after surgery.  Heart testing. This is done to make sure your heart is strong enough for the procedure.  Bronchoscopy. This is a technique that allows your health care provider to look at the inside of your airways. This is done using a  soft, flexible tube (bronchoscope). Along with imaging tests, this can help your health care provider know the exact location and size of the area that will be removed during surgery.  Lymph node sampling. This may need to be done to see if the tumor has spread. It may be done as a separate surgery or right before your lung resection procedure. What happens during the procedure?  An IV tube will be placed in your arm. You will be given a medicine that makes you fall asleep (general anesthetic). You may also get pain medicine through a thin, flexible tube (catheter) in your back.  A breathing tube will be placed in your throat.  Once the surgical team has prepared you for surgery, your surgeon will make an incision on your side. Some resections are done through large incisions, while others can be done through small incisions using smaller instruments and assisted with small cameras (laparoscopic surgery).  Your surgeon will carefully cut the veins, arteries, and bronchus leading to your lung. After being cut, each of these pieces will be sewn or stapled closed. The lung or part of the lung will then be removed.  Your surgeon will check inside your chest to make sure there is no bleeding in or around the lungs. Lymph nodes near the lung may also be removed for later tests.  Your surgeon may put tubes into your  chest to drain extra fluid and air after surgery.  Your incision will be closed. This may be done using:  Stitches that absorb into your body and do not need to be removed.  Stitches that must be removed.  Staples that must be removed. What happens after the procedure?  You will be taken to the recovery area and your progress will be monitored. You may still have a breathing tube and other tubes or catheters in your body immediately after surgery. These will be removed during your recovery. You may be put on a respirator following surgery if some assistance is needed to help your breathing. When you are awake and not experiencing immediate problems from surgery, you will be moved to the intensive care unit (ICU) where you will continue your recovery.  You may feel pain in your chest and throat. Sometimes during recovery, patients may shiver or feel nauseous. You will be given medicine to help with pain and nausea.  The breathing tube will be taken out as soon as your health care providers feel you can breathe on your own. For most people, this happens on the same day as the surgery.  If your surgery and time in the ICU go well, most of the tubes and equipment will be taken out within 1-2 days after surgery. This is about how long most people stay in the ICU. You may need to stay longer, depending on how you are doing.  You should also start respiratory therapy in the ICU. This therapy uses breathing exercises to help your other lung stay healthy and get stronger.  As you improve, you will be moved to a regular hospital room for continued respiratory therapy, help with your bladder and bowels, and to continue medicines.  After your lung or part of your lung is taken out, there will be a space inside your chest. This space will often fill up with fluid over time. The amount of time this takes is different for each person.  You will receive care until you are doing well and your health care  provider feels it is  safe for you to go home or to transfer to an extended care facility. This information is not intended to replace advice given to you by your health care provider. Make sure you discuss any questions you have with your health care provider. Document Released: 06/30/2002 Document Revised: 09/15/2015 Document Reviewed: 05/29/2013  2017 [employer]

## 2016-05-03 NOTE — Progress Notes (Signed)
[address]       [place] [address]             [phone]                    [name] Medical Record #[id number] Date of Birth: 1955/10/21  Referring: [name], MD Primary Care: [name], MD  Chief Complaint:    Left Lower Lobe Lung Cancer and AAA( stented)    History of Present Illness:    William Kales [name] 61 y.o. male is seen in [facility]  for further evaluation of lung mass and follow up after needle biopsy  . Patient has long history tobacco use, stopped in November  . He had  a stent graft of a large abdominal aneurysm last month  . PFTs and CPX testing  and PET scan were done  for evaluation of his left lung mass. He's radiographic findings were reviewed in multidisciplinary thoracic oncology conference . He is progressing well after his abdominal stent graft.  Because of a low diffusion capacity further pulmonary function testing was done including CPX test, results below , Exercise testing with gas exchange demonstrates a low-normal peak VO2 of 22.1 ml/kg/min (80% of the age/gender/weight matched sedentary norms).   Needle biopsy of left lung mass has been completed : FINAL DIAGNOSIS Diagnosis Lung, needle/core biopsy(ies), Left lower lobe - ADENOCARCINOMA. - SEE COMMENT. Microscopic Comment  Current Activity/ Functional Status:  Patient is independent with mobility/ambulation, transfers, ADL's, IADL's.   Zubrod Score: At the time of surgery this patient's most appropriate activity status/level should be described as: '[]'$     0    Normal activity, no symptoms '[x]'$     1    Restricted in physical strenuous activity but ambulatory, able to do out light work '[]'$     2    Ambulatory and capable of self care, unable to do work activities, up and about               >50 % of waking hours                              '[]'$     3    Only limited self care, in bed greater than 50% of waking hours '[]'$     4    Completely disabled,  no self care, confined to bed or chair '[]'$     5    Moribund   Past Medical History:  Diagnosis Date  . AAA (abdominal aortic aneurysm) ([place])   . History of hepatitis B   . HNP (herniated nucleus pulposus), lumbar    L4 with radiculopathy  . Mass of left lung   . Varicose veins of legs   Mitral insufficiency on echo 02/2016  Past Surgical History:  Procedure Laterality Date  . COLONOSCOPY    . INGUINAL HERNIA REPAIR Left 1975   Left inguinal hernia  . INGUINAL HERNIA REPAIR Right   . LUMBAR LAMINECTOMY  October 22, 2012    Family History  Problem Relation Age of Onset  . Diabetes Mother     Social History   Social History  . Marital status: Married    Spouse name: N/A  . Number of children: N/A  . Years of education: N/A   Occupational History  . Not on file.   Social History Main Topics  . Smoking status:  Current Every Day Smoker    Packs/day: 1.00    Types: Cigarettes  . Smokeless tobacco: Never Used  . Alcohol use 7.2 oz/week    12 Cans of beer per week  . 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 Sexual activity: Not on file   Other Topics Concern   Works as school custodian     History  Smoking Status  . Former Smoker  . Packs/day: 1.00  . Types: Cigarettes  . Quit date: 03/05/2016  Smokeless Tobacco  . Never Used    History  Alcohol Use  . 7.2 oz/week  . 12 Cans of beer per week     Allergies  Allergen Reactions  . Tramadol Nausea And Vomiting    No current outpatient prescriptions on file.   No current facility-administered medications for this visit.    Facility-Administered Medications Ordered in Other Visits  Medication Dose Route Frequency Provider Last Rate Last Dose  . fludeoxyglucose F - 18 (FDG) injection 7.5 millicurie  7.5 millicurie Intravenous Once PRN Kalman Jewels, MD            Review of Systems:     Cardiac Review of Systems: Y or N  Chest Pain [n    ]  Resting SOB [ n   ] Exertional SOB  [ y ]  Orthopnea n[  ]   Pedal Edema [ n  ]    Palpitations [n  ] Syncope  [n  ]   Presyncope [  n ]  General Review of Systems: [Y] = yes [  ]=no Constitional: recent weight change [ n ];  Wt loss over the last 3 months [   ] anorexia [  ]; fatigue [ y ]; nausea [  ]; night sweats [  ]; fever [  ]; or chills [  ];          Dental: poor dentition[  ]; Last Dentist visit:   Eye : blurred vision [  ]; diplopia [   ]; vision changes [  ];  Amaurosis fugax[  ]; Resp: cough Blue.Reese  ];  wheezing[ y ];  hemoptysis[  n]; shortness of breath[y  ]; paroxysmal nocturnal dyspnea[  ]; dyspnea on exertion[  ]; or orthopnea[  ];  GI:  gallstones[  ], vomiting[  ];  dysphagia[  ]; melena[  ];  hematochezia [  ]; heartburn[  ];   Hx of  Colonoscopy[ y ]; GU: kidney stones [  ]; hematuria[  ];   dysuria [  ];  nocturia[  ];  history of     obstruction [  ]; urinary frequency [  ]             Skin: rash, swelling[  ];, hair loss[  ];  peripheral edema[  ];  or itching[  ]; Musculosketetal: myalgias[  ];  joint swelling[  ];  joint erythema[  ];  joint pain[  ];  back pain[  ];  Heme/Lymph: bruising[  ];  bleeding[  ];  anemia[  ];  Neuro: TIA[n  ];  headaches[ n ];  stroke[  ];  vertigo[  ];  seizures[  ];   paresthesias[  ];  difficulty walking[  ];  Psych:depression[  ]; anxiety[  ];  Endocrine: diabetes[ n ];  thyroid dysfunction[n  ];  Immunizations: Flu up to date [ n ]; Pneumococcal up to date [n  ];  Other:  Physical Exam: BP 129/73   Pulse 88   Resp 20   Ht '6\' 3"'$  (1.905 m)   Wt 147 lb (66.7 kg)   SpO2 92% Comment: RA  BMI 18.37 kg/m   PHYSICAL EXAMINATION: General appearance: alert, cooperative and appears older than stated age Head: Normocephalic, without obvious abnormality, atraumatic Neck: no adenopathy, no carotid bruit, no JVD, supple, symmetrical, trachea midline and thyroid not enlarged, symmetric, no tenderness/mass/nodules Lymph nodes: Cervical, supraclavicular, and  axillary nodes normal. Resp: clear to auscultation bilaterally Back: symmetric, no curvature. ROM normal. No CVA tenderness. Cardio: regular rate and rhythm, S1, S2 normal, 2/6 murmur of mitral insufficiency, click, rub or gallop GI: soft, non-tender; bowel sounds normal; no masses,  no organomegaly Extremities: extremities normal, atraumatic, no cyanosis or edema and clubbing of all fingers,  Neurologic: Grossly normal palpable dt and pt pulses patient has significant bruit heard over the abdomen and even radiating into the lower back bilaterally, he has no cardiac murmur.   Diagnostic Studies & Laboratory data:     Recent Radiology Findings:  Nm Pet Image Initial (pi) Skull Base To Thigh  Result Date: 03/07/2016 CLINICAL DATA:  Initial treatment strategy for left lung mass. EXAM: NUCLEAR MEDICINE PET SKULL BASE TO THIGH TECHNIQUE: 7.5 mCi F-18 FDG was injected intravenously. Full-ring PET imaging was performed from the skull base to thigh after the radiotracer. CT data was obtained and used for attenuation correction and anatomic localization. FASTING BLOOD GLUCOSE:  Value: 114 mg/dl COMPARISON:  Chest CT 02/16/2016 FINDINGS: NECK No hypermetabolic lymph nodes in the neck. CHEST No hypermetabolic mediastinal or hilar nodes. The left lower lobe lung mass measures 3.8 x 3.2 cm and is metabolically active with SUV max of 9.3. Extensive pleural calcifications noted in the right lung posteriorly likely due to prior hemothorax or old empyema. Small scattered subpleural pulmonary nodules are stable. Recommend attention on future scans. Stable emphysematous changes and pulmonary scarring. 4 cm ascending aortic aneurysm. ABDOMEN/PELVIS No abnormal hypermetabolic activity within the liver, pancreas, adrenal glands, or spleen. No hypermetabolic lymph nodes in the abdomen or pelvis. New line stable 6 cm infrarenal abdominal aortic aneurysm. SKELETON No focal hypermetabolic activity to suggest skeletal  metastasis. IMPRESSION: 1. 3.8 x 3.2 cm superior segment left lower lobe lung mass is hypermetabolic and consistent with primary lung neoplasm. No mediastinal or hilar lymphadenopathy and no findings to suggest metastatic disease. 2. A few small scattered pulmonary nodules are likely benign but will require surveillance. 3. Stable ascending aortic aneurysm and infrarenal abdominal aortic aneurysm. Electronically Signed   By: Marijo Sanes M.D.   On: 03/07/2016 16:54    I have independently reviewed the above radiology studies  and reviewed the findings with the patient.   Ct Chest W Contrast  Result Date: 02/16/2016 CLINICAL DATA:  Cough. Tobacco dependence. Weight loss. History of abdominal aortic aneurysm. Question lung cancer. EXAM: CT CHEST, ABDOMEN, AND PELVIS WITH CONTRAST TECHNIQUE: Multidetector CT imaging of the chest, abdomen and pelvis was performed following the standard protocol during bolus administration of intravenous contrast. CONTRAST:  160m ISOVUE-300 IOPAMIDOL (ISOVUE-300) INJECTION 61% [facility] [address] Results: Creatinine 0.7 mg/dL. COMPARISON:  Chest radiograph of 12/06/2006.  No prior CTs. FINDINGS: CT CHEST FINDINGS Cardiovascular: Aortic and branch vessel atherosclerosis. Tortuous thoracic aorta. Borderline ascending thoracic aortic aneurysm, 4.0 cm. The proximal descending segment is borderline prominent at 3.4 cm. Cardiomegaly, accentuated by a mild pectus excavatum deformity. No  pericardial effusion. No central pulmonary embolism, on this non-dedicated study. Mediastinum/Nodes: No supraclavicular adenopathy. 1.2 cm hypo attenuating left thyroid nodule is nonspecific. No mediastinal or hilar adenopathy. Mild soft tissue thickening anterior to the ascending aorta favored to represent a pericardial recess. Lungs/Pleura: No pleural fluid. Calcified right-sided pleural plaque. Moderate centrilobular and paraseptal emphysema.  Probable secretions in the trachea. Lower lobe predominant bronchial wall thickening. Minimal motion degradation. Inferior right middle lobe pleural or subpleural 5 mm nodule on image 121/series 6. 2 mm medial right apex pulmonary nodule on image 56/series 6. A subpleural left upper lobe pulmonary nodule measures 5 mm on image 60/series 6. Superior segment left lower lobe lung mass measures 3.8 by 3.2 by 3.1 cm on transverse image 99/series 6 and sagittal image 86. This contacts the left major fissure over an approximately 1.7 cm span. Subpleural posterior left upper lobe 4 mm nodule on image 44/series 6. Musculoskeletal: No acute osseous abnormality. CT ABDOMEN PELVIS FINDINGS Hepatobiliary: Probable hepatic steatosis diffusely. More focal steatosis adjacent the falciform ligament. Scattered tiny low-density liver lesions are too small to characterize. Mild hepatomegaly at 18.9 cm. Normal gallbladder, without biliary ductal dilatation. Pancreas: Normal, without mass or ductal dilatation. Spleen: Normal in size, without focal abnormality. Adrenals/Urinary Tract: Normal adrenal glands. Too small to characterize lesion in the interpolar left kidney is likely a cyst. Normal right kidney, without hydronephrosis. Normal urinary bladder. Stomach/Bowel: Normal stomach, without wall thickening. Normal colon, appendix, and terminal ileum. Normal small bowel. Vascular/Lymphatic: Infrarenal abdominal aortic aneurysm, maximally 5.9 x 6.3 cm on image 150/series 2. Extensive wall thrombus. No periaortic hemorrhage. No extension into the iliacs. Both common iliac arteries are mildly ectatic at 1.3 cm. No abdominopelvic adenopathy. Reproductive: Mild prostatomegaly.  Small left hydrocele. Other: No significant free fluid. No evidence of omental or peritoneal disease. Musculoskeletal: Advanced degenerative disc disease at the L4-5 level. IMPRESSION: 1. Superior segment left lower lobe lung mass, consistent with primary bronchogenic  carcinoma. No evidence of thoracic nodal or extra thoracic metastasis. 2. Infrarenal abdominal aortic aneurysm, maximally 6.3 cm. Vascular surgery consultation recommended due to increased risk of rupture for AAA >5.5 cm. This recommendation follows ACR consensus guidelines: White Paper of the ACR Incidental Findings Committee II on Vascular Findings. J Am Coll Radiol 2013; 10:789-794. These results (impressions 1 and 2) will be called to the ordering clinician or representative by the Radiologist Assistant, and communication documented in the PACS or zVision Dashboard. 3. Coronary artery atherosclerosis. Aortic atherosclerosis. 4. Borderline ascending thoracic aortic aneurysm. 5. Right-sided pleural calcifications, suggesting prior empyema or hemothorax. 6. Probable hepatic steatosis. 7. Prostatomegaly. 8. Bilateral common iliac artery ectasia. Electronically Signed   By: Abigail Miyamoto M.D.   On: 02/16/2016 11:24    I have independently reviewed the above radiology studies  and reviewed the findings with the patient.   Recent Lab Findings: Lab Results  Component Value Date   WBC 8.2 03/06/2016   HGB 14.4 03/06/2016   HCT 42.4 03/06/2016   PLT 343 03/06/2016   GLUCOSE 104 (H) 03/06/2016   ALT 11 (L) 03/06/2016   AST 23 03/06/2016   NA 133 (L) 03/06/2016   K 4.0 03/06/2016   CL 100 (L) 03/06/2016   CREATININE 0.65 03/06/2016   BUN 7 03/06/2016   CO2 25 03/06/2016   INR 1.11 03/06/2016   PFT's FEV1 2.53   68% DLCO 14.57  37%  Conclusions: Although there is airway obstruction and a diffusion defect suggesting emphysema, the absence of overinflation is  inconsistent with that diagnosis. In view of the severity of the diffusion defect, studies with exercise would be helpful to evaluate the presence of hypoxemia.   6 Minute Walk Test Results  Patient:            William HARNISH [name] Date:                03/08/2016   Supplemental O2 during test? NO                                        Baseline                                 End  Time                            1106                                        1112 Heartrate                     70                                            81 Dyspnea                      0                                              0 Fatigue                        0                                              0 O2 sat                          98%                                         99% Blood pressure            150/86                                  158/   Patient ambulated at a NORMAL pace for a total distance of 1176 feet with NO stops.  Ambulation was limited primarily due to N/A  Overall the test was tolerated VERY WELL with no complaints of any discomfort  Cardiopulmonary exercise test  Order: [id number]  Status:  Final result Visible to patient:  No (Not Released) Next appt:  Today at 11:30 AM in Cardiothoracic Surgery [name], MD) Dx:  Cancer of lower lobe of left lung [facility])  Narrative   Referred for: Risk Stratification for Lung Resection  Procedure: This patient underwent staged symptom-limited exercise testing using a individualized bike protocol with expired gas analysis metabolic evaluation during exercise.  Demographics  Age: 16 Ht. (in.) 21 Wt. (lb) 146 BMI: 18.2   Predicted Peak VO2: 27.7  Gender: Male Ht (cm) 190.5 Wt. (kg) 66.2    Results  Pre-Exercise PFTs   FVC 4.16 (91%)     FEV1 2.34 (67%)      FEV1/FVC 56 (72%)      MVV 77 (49%)      Exercise Time:  8:55    Watts: 84  RPE: 14  Reason stopped: Patient ended test due to severe urge to cough.   Additional symptoms: Dyspnea (5/10)  Resting HR: 110 Peak HR: 143  (91% age predicted max HR)  BP rest: 126/66 BP peak: 144/72  Peak VO2: 22.1 (80% predicted peak VO2)  VE/VCO2 slope: 43.4  OUES: 1.83  Peak RER: 1.12  Ventilatory Threshold: 18.4 (6% predicted or measured peak VO2)  Peak RR  39  Peak Ventilation: 73.6  VE/MVV: 96%  PETCO2 at peak: 26  O2pulse: 10  (83% predicted O2pulse)   Interpretation  Notes: Patient gave a very good effort. Pulse-oximetry remained did not decrease below 95%, which was at peak exercise. Exercise was performed on a cycle ergometer starting at [facility] and increasing by 12W/min. Patient has not taken medications in a week due to request to refill.  ECG: Resting ECG in sinus tachycardia. HR response appropriate. There were occasional PVCs with increasing exercise intensity, however no sustained arrhythmias and no ST-T changes. BP response midly blunted.   PFT: Pre-exercise spirometry demonstrates both restrictive and obstructive patterns with reduced FEV1 and FEV1/FVC. MVV below normal.   CPX: Exercise Capacity- Exercise testing with gas exchange demonstrates a low-normal peak VO2 of 22.1 ml/kg/min (80% of the age/gender/weight matched sedentary norms). The RER of 1.12 indicates a maximal effort.   Cardiovascular response- The O2pulse (a surrogate for stroke volume) increased linearly and appropriately reaching 10 ml/beat (83% predicted).   Ventilatory response- The VE/VCO2 slope is significantly elevated and indicates excessive dead space ventilation. The oxygen uptake efficiency slope (OUES) is reduced. The VO2 at the ventilatory threshold was normal at 67% of the predicted peak VO2. At peak exercise, the ventilation reached 96% of the measured MVV and breathing reserve was 3 indicating ventilatory limits were nearly approached. PETCO2 was At low at 26 mmHg during peak exercise.  Conclusion:Exercise testing with gas exchange demonstrates normal functional capacity when compared to matched sedentary norms. Patient is primarily ventilatory limited and is clearly of obstructive nature. VE/VCO2 is elevated and indicates significant dead space ventilation most likely due to obstructive lung disease. Patient's measured VO20mx is low-normal at  22.1 ml/kg/min and indicates relatively little increased risk associated with lung resection. However, note patient's resting tachycardia (with consideration of no medication for one week prior to exercise).   Test, report and preliminary impression by: KLandis Martins MS, ACSM-RCEP 04/25/2016 3:25 PM  FINALIZED  PMarshell GarfinkelMD [facility] Pager [phone] 05/03/2016, 5:59 AM           Assessment / Plan:   Superior segment left lower lobe lung mass, consistent with primary bronchogenic carcinoma. No evidence of thoracic nodal or extra thoracic metastasis. 3.8 x 3.2 cm superior  segment left lower lobe lung mass is hypermetabolic Is biopsy-proven  Left lower lobe  ADENOCARCINOMA .  Marland Kitchen No mediastinal or hilar lymphadenopathy and no findings to suggest metastatic disease -  Clinicial stage IB disease (cT2a,cN0,cM0) I discussed with the patient  in detail the surgical options for treatment  primary carcinoma of the lung . Initially the Patient wished to consider radiation optiona and discussed with Dr Sondra Come . He is now no longer smoking and wishes to reconsider surgical resection. His CPX testing is suitable for lobectomy. With his long term smoking history history of mitral regurgitation, and diffuse vascular disease we will have cardiology formally see him for preoperative clearance and then schedule bronchoscopy left video-assisted thoracoscopy and lung resection with node dissection.   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

## 2016-05-04 ENCOUNTER — Encounter (INDEPENDENT_AMBULATORY_CARE_PROVIDER_SITE_OTHER): Payer: Self-pay

## 2016-05-04 ENCOUNTER — Ambulatory Visit (INDEPENDENT_AMBULATORY_CARE_PROVIDER_SITE_OTHER): Payer: BLUE CROSS/BLUE SHIELD | Admitting: Cardiology

## 2016-05-04 ENCOUNTER — Other Ambulatory Visit: Payer: Self-pay | Admitting: *Deleted

## 2016-05-04 ENCOUNTER — Encounter: Payer: Self-pay | Admitting: Cardiology

## 2016-05-04 VITALS — BP 110/70 | HR 80 | Ht 75.0 in | Wt 150.0 lb

## 2016-05-04 DIAGNOSIS — C3432 Malignant neoplasm of lower lobe, left bronchus or lung: Secondary | ICD-10-CM

## 2016-05-04 DIAGNOSIS — Z01818 Encounter for other preprocedural examination: Secondary | ICD-10-CM

## 2016-05-04 DIAGNOSIS — I10 Essential (primary) hypertension: Secondary | ICD-10-CM | POA: Diagnosis not present

## 2016-05-04 DIAGNOSIS — I2510 Atherosclerotic heart disease of native coronary artery without angina pectoris: Secondary | ICD-10-CM

## 2016-05-04 DIAGNOSIS — Z8679 Personal history of other diseases of the circulatory system: Secondary | ICD-10-CM

## 2016-05-04 DIAGNOSIS — I340 Nonrheumatic mitral (valve) insufficiency: Secondary | ICD-10-CM

## 2016-05-04 NOTE — Patient Instructions (Signed)
Medication Instructions:  Your physician recommends that you continue on your current medications as directed. Please refer to the Current Medication list given to you today.   Labwork: None ordered  Testing/Procedures: None ordered  Follow-Up: Your physician recommends that you schedule a follow-up appointment in: [place]        Any Other Special Instructions Will Be Listed Below (If Applicable).     If you need a refill on your cardiac medications before your next appointment, please call your pharmacy.

## 2016-05-04 NOTE — Progress Notes (Signed)
Cardiology Office Note   Date:  05/04/2016   ID:  William Kales [name], DOB Aug 29, 1955, MRN [id number]  PCP:  [name], MD  Cardiologist:  Dr. Meda Coffee    Chief Complaint  Patient presents with  . Pre-op Exam      History of Present Illness: [name]. is a 61 y.o. male who presents for pr-op risk stratification for bronchoscopy left video-assisted thoracoscopy and lung resection with node dissection.   He has a history of of AAA s/p stent graft this admission, lung mass (pending needle biopsy) and ongoing tobacco abuse who consulted for abnormal echo.  Dr. Roxy Horseman for evaluation of left lung mass on PFT and PET scan. 8 x 3.2 cm superior segment left lower lobe lung mass is hypermetabolic and consistent with primary lung neoplasm. No mediastinal or hilar lymphadenopathy and no findings to suggest metastatic disease.  He had needle biopsy and + for adenocarcinoma.  Plan is for bronchoscopy left video-assisted thoracoscopy and lung resection with node dissection.   Echo done 03/12/16 showed normal LV function to 60-65%, moderate LVH, MV anterior leaflet prolapse with moderate eccentric posterior directed MR cannot r/o parachute MV. Other mild valvular abnormality.  No prior cardiac hx or rheumatic heart disease. Denies hx of IV drug use. Hx of tobacco smoking of 1 pack a day for the past 46 years. Drink 1 case of bear every week. No family hx of early CAD. The patient denies nausea, vomiting, fever, chest pain, palpitations, shortness of breath, orthopnea, PND, dizziness, syncope, cough, congestion, abdominal pain, hematochezia, melena, lower extremity edema.  Dr. Meda Coffee saw 03/13/16   Per Dr. Meda Coffee "This patient will eventually need MV repair if his prognosis is good from oncology standpoint. However, his lung cancer therapy  is the priority right now. His cardiac condition won't preclude possible lung surgery.  Once his lung cancer is under control he will need a TEE to  further evaluate his mitral valve anatomy and MR severity, and also have ischemic workup as tere is evidence on calcifications in his coronary arteries on chest CT.  For now I would treat medically, he need good BP management,"  Since that consult pt had cardiopulmonary exercise test.   Conclusion:Exercise testing with gas exchange demonstrates normal functional capacity when compared to matched sedentary norms. Patient is primarily ventilatory limited and is clearly of obstructive nature. VE/VCO2 is elevated and indicates significant dead space ventilation most likely due to obstructive lung disease. Patient's measured VO4mx is low-normal at 22.1 ml/kg/min and indicates relatively little increased risk associated with lung resection. However, note patient's resting tachycardia (with consideration of no medication for one week prior to exercise).  ECG: Resting ECG in sinus tachycardia. HR response appropriate. There were occasional PVCs with increasing exercise intensity, however no sustained arrhythmias and no ST-T changes. BP response midly blunted  Today pt is without complaints, no chest pain and no SOB.  We discussed his MV briefly.  I reviewed cardiopulmonary exercise test with Dr. SMarlou PorchDOD and he agrees with Dr. NMeda Coffeethat pt is low to moderate risk for lung surgery.     Past Medical History:  Diagnosis Date  . [place]   . History of hepatitis B   . HNP (herniated nucleus pulposus), lumbar    L4 with radiculopathy  . Mass of left lung   . Mitral insufficiency 04/06/2016   This patient will eventually need MV repair if his prognosis is good from oncology standpoint.  However, his lung cancer therapy  is the priority right now. His cardiac condition won't preclude possible lung surgery.  Once his lung cancer is under control he will need a TEE to further evaluate his mitral valve anatomy and MR severity, and also have ischemic workup as tere is evidence on  calcificati  . Varicose veins of legs     Past Surgical History:  Procedure Laterality Date  . ABDOMINAL AORTIC ENDOVASCULAR STENT GRAFT N/A 03/12/2016   Procedure: ABDOMINAL AORTIC ENDOVASCULAR STENT GRAFT;  Surgeon: Conrad Mount Carroll, MD;  Location: [facility];  Service: Vascular;  Laterality: N/A;  . COLONOSCOPY    . INGUINAL HERNIA REPAIR Left 1975   Left inguinal hernia  . INGUINAL HERNIA REPAIR Right   . LUMBAR LAMINECTOMY  October 22, 2012     Current Outpatient Prescriptions  Medication Sig Dispense Refill  . carvedilol (COREG) 3.125 MG tablet Take 1 tablet (3.125 mg total) by mouth 2 (two) times daily with a meal. 60 tablet 1  . losartan (COZAAR) 25 MG tablet Take 1 tablet (25 mg total) by mouth daily. 30 tablet 1   No current facility-administered medications for this visit.     Allergies:   Tramadol    Social History:  The patient  reports that he quit smoking about 1 months ago. His smoking use included Cigarettes. He smoked 1.00 pack per day. He has never used smokeless tobacco. He reports that he drinks about 7.2 oz of alcohol per week . 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  Family History:  The patient's family history includes Diabetes in his mother.    ROS:  General:no colds or fevers, no weight changes Skin:no rashes or ulcers HEENT:no blurred vision, no congestion CV:see HPI PUL:see HPI GI:no diarrhea constipation or melena, no indigestion GU:no hematuria, no dysuria MS:no joint pain, no claudication Neuro:no syncope, no lightheadedness Endo:no diabetes, no thyroid disease  Wt Readings from Last 3 Encounters:  05/04/16 150 lb (68 kg)  05/03/16 147 lb (66.7 kg)  04/27/16 147 lb (66.7 kg)     PHYSICAL EXAM: VS:  BP 110/70 (BP Location: Right Arm, Patient Position: Sitting, Cuff Size: Normal)   Pulse 80   Ht '6\' 3"'$  (1.905 m)   Wt 150 lb (68 kg)   BMI 18.75 kg/m  , BMI Body mass index is 18.75  kg/m. General:Pleasant affect, NAD Skin:Warm and dry, brisk capillary refill HEENT:normocephalic, sclera clear, mucus membranes moist Neck:supple, no JVD, no bruits  Heart:S1S2 RRR with murmur of mitral insuff. no gallup, rub or click Lungs:clear without rales, rhonchi, or wheezes IOE:VOJJ, non tender, + BS, do not palpate liver spleen or masses Ext:no lower ext edema, 2+ pedal pulses, 2+ radial pulses Neuro:alert and oriented X 3, MAE, follows commands, + facial symmetry    EKG:  EKG is ordered today. The ekg ordered today demonstrates  SR early repol on EKG present on Nov 2017 EKG.     Recent Labs: 03/06/2016: ALT 11 03/12/2016: Magnesium 1.6 03/13/2016: BUN <5; Creatinine, Ser 0.68; Potassium 3.9; Sodium 134 03/29/2016: Hemoglobin 14.1; Platelets 387    Lipid Panel No results found for: CHOL, TRIG, HDL, CHOLHDL, VLDL, LDLCALC, LDLDIRECT     Other studies Reviewed: Additional studies/ records that were reviewed today include: . ECHO: Study Conclusions  - Left ventricle: The cavity size was normal. Wall thickness was   increased in a pattern of moderate LVH. Systolic function was   normal. The  estimated ejection fraction was in the range of 60%   to 65%. - Mitral valve: Anterior leaflet prolapse with likley moderate   eccentric posteriorly directed MR   Anteiror leaflet is very thickend and myxomatous Cannot r/o   parachute MV doubt   vegetation consider TEE if clinically indicated - Atrial septum: No defect or patent foramen ovale was identified.  Cardiopulmonary exercise test Results  Pre-Exercise PFTs   FVC 4.16 (91%)     FEV1 2.34 (67%)      FEV1/FVC 56 (72%)      MVV 77 (49%)      Exercise Time:  8:55    Watts: 84  RPE: 14  Reason stopped: Patient ended test due to severe urge to cough.   Additional symptoms: Dyspnea (5/10)  Resting HR: 110 Peak HR: 143  (91% age predicted max HR)  BP rest: 126/66 BP peak: 144/72  Peak  VO2: 22.1 (80% predicted peak VO2)  VE/VCO2 slope: 43.4  OUES: 1.83  Peak RER: 1.12  Ventilatory Threshold: 18.4 (6% predicted or measured peak VO2)  Peak RR 39  Peak Ventilation: 73.6  VE/MVV: 96%  PETCO2 at peak: 26  O2pulse: 10  (83% predicted O2pulse)   Interpretation  Notes: Patient gave a very good effort. Pulse-oximetry remained did not decrease below 95%, which was at peak exercise. Exercise was performed on a cycle ergometer starting at [facility] and increasing by 12W/min. Patient has not taken medications in a week due to request to refill.  ECG: Resting ECG in sinus tachycardia. HR response appropriate. There were occasional PVCs with increasing exercise intensity, however no sustained arrhythmias and no ST-T changes. BP response midly blunted.   PFT: Pre-exercise spirometry demonstrates both restrictive and obstructive patterns with reduced FEV1 and FEV1/FVC. MVV below normal.   CPX: Exercise Capacity- Exercise testing with gas exchange demonstrates a low-normal peak VO2 of 22.1 ml/kg/min (80% of the age/gender/weight matched sedentary norms). The RER of 1.12 indicates a maximal effort.   Cardiovascular response- The O2pulse (a surrogate for stroke volume) increased linearly and appropriately reaching 10 ml/beat (83% predicted).   Ventilatory response- The VE/VCO2 slope is significantly elevated and indicates excessive dead space ventilation. The oxygen uptake efficiency slope (OUES) is reduced. The VO2 at the ventilatory threshold was normal at 67% of the predicted peak VO2. At peak exercise, the ventilation reached 96% of the measured MVV and breathing reserve was 3 indicating ventilatory limits were nearly approached. PETCO2 was At low at 26 mmHg during peak exercise.  Conclusion:Exercise testing with gas exchange demonstrates normal functional capacity when compared to matched sedentary norms. Patient is primarily ventilatory limited and is clearly of  obstructive nature. VE/VCO2 is elevated and indicates significant dead space ventilation most likely due to obstructive lung disease. Patient's measured VO41mx is low-normal at 22.1 ml/kg/min and indicates relatively little increased risk associated with lung resection. However, note patient's resting tachycardia (with consideration of no medication for one week prior to exercise).   Test, report and preliminary impression by: KLandis Martins MS, ACSM-RCEP 04/25/2016 3:25 PM  FINALIZED  PMarshell GarfinkelMD [facility] Pager [phone] 05/03/2016, 5:59 AM          ASSESSMENT AND PLAN:  1.  Pre-op exam after review of echo and cardiopulmonary exercise test Dr. SMarlou Porchhas reviewed as well.  We agree with Dr. NMeda Coffeethat pt is at low to moderate cardiac risk for his  bronchoscopy left video-assisted thoracoscopy and lung resection with node dissection.  2. Most probably severe MR , no pul. HTN. He will eventually need MV repair in future but for now Cancer treatment is priority.  Once his lung cancer is controlled, would then do TEE for further eval of mitral valve.    3. Coronary calcifications on chest CT with no ST-T wave changes on cardiopulmonary stress test.   4. HTN stable on cozaar and coreg.    5.  Tachycardia today on meds HR 74.    6.  AAA with stent placement.  Stable and followed by Dr. Bridgett Larsson.   He will follow up with Dr. Meda Coffee in 6-8 weeks.      Current medicines are reviewed with the patient today.  The patient Has no concerns regarding medicines.  The following changes have been made:  See above Labs/ tests ordered today include:see above  Disposition:   FU:  see above  Signed, Cecilie Kicks, NP  05/04/2016 2:08 PM    [facility] [place], [place], [place], [place] Phone: [phone]; Fax: [phone]

## 2016-05-08 ENCOUNTER — Ambulatory Visit: Payer: BLUE CROSS/BLUE SHIELD | Admitting: Physician Assistant

## 2016-05-09 ENCOUNTER — Ambulatory Visit: Payer: BLUE CROSS/BLUE SHIELD | Admitting: Radiation Oncology

## 2016-05-09 ENCOUNTER — Ambulatory Visit: Payer: BLUE CROSS/BLUE SHIELD | Attending: Radiation Oncology

## 2016-05-10 ENCOUNTER — Encounter (HOSPITAL_COMMUNITY): Payer: Self-pay | Admitting: *Deleted

## 2016-05-10 ENCOUNTER — Other Ambulatory Visit (HOSPITAL_COMMUNITY): Payer: Self-pay | Admitting: *Deleted

## 2016-05-10 ENCOUNTER — Encounter (HOSPITAL_COMMUNITY): Payer: Self-pay

## 2016-05-10 ENCOUNTER — Inpatient Hospital Stay (HOSPITAL_COMMUNITY)
Admission: RE | Admit: 2016-05-10 | Discharge: 2016-05-10 | Disposition: A | Discharge status: Home / Self Care | Payer: BLUE CROSS/BLUE SHIELD | Source: Ambulatory Visit

## 2016-05-10 HISTORY — DX: Essential (primary) hypertension: I10

## 2016-05-10 NOTE — Progress Notes (Signed)
Pt unable to come to PAT appt due to inclement weather. Called and spoke with him for his pre-op call. Pt has hx of mitral valve insufficiency, is followed by Dr. Liane Comber. Cardiac clearance in EPIC dated 05/04/16.

## 2016-05-10 NOTE — Pre-Procedure Instructions (Signed)
[name]  05/10/2016    Your procedure is scheduled on Friday, May 11, 2016 at 7:30 AM.   Report to [facility] Entrance "A" Admitting Office at 5:30 AM.   Call this number if you have problems the morning of surgery: [phone]   Remember:  Do not eat food or drink liquids after midnight tonight.  Take these medicines the morning of surgery with A SIP OF WATER: Carvedilol (Coreg)   Do not wear jewelry.  Do not wear lotions, powders, cologne or deodorant.  Men may shave face and neck.  Do not bring valuables to the hospital.  [facility] is not responsible for any belongings or valuables.  Contacts, dentures or bridgework may not be worn into surgery.  Leave your suitcase in the car.  After surgery it may be brought to your room.  For patients admitted to the hospital, discharge time will be determined by your treatment team.  Special instructions:  [facility] - Preparing for Surgery  Before surgery, you can play an important role.  Because skin is not sterile, your skin needs to be as free of germs as possible.  You can reduce the number of germs on you skin by washing with CHG (chlorahexidine gluconate) soap before surgery.  CHG is an antiseptic cleaner which kills germs and bonds with the skin to continue killing germs even after washing.  Please DO NOT use if you have an allergy to CHG or antibacterial soaps.  If your skin becomes reddened/irritated stop using the CHG and inform your nurse when you arrive at Short Stay.  Do not shave (including legs and underarms) for at least 48 hours prior to the first CHG shower.  You may shave your face.  Please follow these instructions carefully:   1.  Shower with CHG Soap the night before surgery and the                    morning of Surgery.  2.  If you choose to wash your hair, wash your hair first as usual with your       normal shampoo.  3.  After you shampoo, rinse your hair and body thoroughly to remove  the shampoo.  4.  Use CHG as you would any other liquid soap.  You can apply chg directly       to the skin and wash gently with scrungie or a clean washcloth.  5.  Apply the CHG Soap to your body ONLY FROM THE NECK DOWN.        Do not use on open wounds or open sores.  Avoid contact with your eyes, ears, mouth and genitals (private parts).  Wash genitals (private parts) with your normal soap.  6.  Wash thoroughly, paying special attention to the area where your surgery        will be performed.  7.  Thoroughly rinse your body with warm water from the neck down.  8.  DO NOT shower/wash with your normal soap after using and rinsing off       the CHG Soap.  9.  Pat yourself dry with a clean towel.            10.  Wear clean pajamas.            11.  Place clean sheets on your bed the night of your first shower and do not        sleep with  pets.  Day of Surgery  Do not apply any lotions/deodorants the morning of surgery.  Please wear clean clothes to the hospital.   Please read over the fact sheets that you were given.

## 2016-05-10 NOTE — Anesthesia Preprocedure Evaluation (Addendum)
Anesthesia Evaluation  Patient identified by MRN, date of birth, ID band Patient awake    Reviewed: Allergy & Precautions, NPO status , Patient's Chart, lab work & pertinent test results  History of Anesthesia Complications Negative for: history of anesthetic complications  Airway Mallampati: II  TM Distance: >3 FB Neck ROM: Full    Dental  (+) Edentulous Upper, Edentulous Lower, Dental Advisory Given   Pulmonary former smoker,    Pulmonary exam normal        Cardiovascular hypertension, Pt. on home beta blockers Normal cardiovascular exam  Study Conclusions  - Left ventricle: The cavity size was normal. Wall thickness was   increased in a pattern of moderate LVH. Systolic function was   normal. The estimated ejection fraction was in the range of 60%   to 65%. - Mitral valve: Anterior leaflet prolapse with likley moderate   eccentric posteriorly directed MR   Anteiror leaflet is very thickend and myxomatous Cannot r/o   parachute MV doubt   vegetation consider TEE if clinically indicated - Atrial septum: No defect or patent foramen ovale was identified.   Neuro/Psych negative neurological ROS  negative psych ROS   GI/Hepatic negative GI ROS, (+) Hepatitis -, B  Endo/Other    Renal/GU negative Renal ROS     Musculoskeletal   Abdominal   Peds  Hematology   Anesthesia Other Findings   Reproductive/Obstetrics                           Anesthesia Physical  Anesthesia Plan  ASA: III  Anesthesia Plan: General   Post-op Pain Management:    Induction: Intravenous  Airway Management Planned: Double Lumen EBT  Additional Equipment: CVP and Arterial line  Intra-op Plan:   Post-operative Plan: Post-operative intubation/ventilation  Informed Consent: I have reviewed the patients History and Physical, chart, labs and discussed the procedure including the risks, benefits and  alternatives for the proposed anesthesia with the patient or authorized representative who has indicated his/her understanding and acceptance.   Dental advisory given  Plan Discussed with: CRNA and Anesthesiologist  Anesthesia Plan Comments:        Anesthesia Quick Evaluation

## 2016-05-11 ENCOUNTER — Inpatient Hospital Stay (HOSPITAL_COMMUNITY): Payer: BLUE CROSS/BLUE SHIELD

## 2016-05-11 ENCOUNTER — Encounter (HOSPITAL_COMMUNITY)
Admission: RE | Disposition: A | Discharge status: Home Health | Payer: Self-pay | Source: Ambulatory Visit | Attending: Cardiothoracic Surgery

## 2016-05-11 ENCOUNTER — Inpatient Hospital Stay (HOSPITAL_COMMUNITY): Payer: BLUE CROSS/BLUE SHIELD | Admitting: Anesthesiology

## 2016-05-11 ENCOUNTER — Encounter (HOSPITAL_COMMUNITY): Payer: Self-pay | Admitting: *Deleted

## 2016-05-11 ENCOUNTER — Inpatient Hospital Stay (HOSPITAL_COMMUNITY)
Admission: RE | Admit: 2016-05-11 | Discharge: 2016-05-24 | DRG: 163 | Disposition: A | Discharge status: Home Health | Payer: BLUE CROSS/BLUE SHIELD | Source: Ambulatory Visit | Attending: Cardiothoracic Surgery | Admitting: Cardiothoracic Surgery

## 2016-05-11 DIAGNOSIS — E43 Unspecified severe protein-calorie malnutrition: Secondary | ICD-10-CM | POA: Diagnosis not present

## 2016-05-11 DIAGNOSIS — C3432 Malignant neoplasm of lower lobe, left bronchus or lung: Secondary | ICD-10-CM

## 2016-05-11 DIAGNOSIS — Z4682 Encounter for fitting and adjustment of non-vascular catheter: Secondary | ICD-10-CM | POA: Diagnosis not present

## 2016-05-11 DIAGNOSIS — J449 Chronic obstructive pulmonary disease, unspecified: Secondary | ICD-10-CM | POA: Diagnosis present

## 2016-05-11 DIAGNOSIS — C3482 Malignant neoplasm of overlapping sites of left bronchus and lung: Secondary | ICD-10-CM | POA: Diagnosis not present

## 2016-05-11 DIAGNOSIS — Z902 Acquired absence of lung [part of]: Secondary | ICD-10-CM

## 2016-05-11 DIAGNOSIS — I714 Abdominal aortic aneurysm, without rupture: Secondary | ICD-10-CM | POA: Diagnosis not present

## 2016-05-11 DIAGNOSIS — C3490 Malignant neoplasm of unspecified part of unspecified bronchus or lung: Secondary | ICD-10-CM | POA: Diagnosis present

## 2016-05-11 DIAGNOSIS — C3492 Malignant neoplasm of unspecified part of left bronchus or lung: Secondary | ICD-10-CM | POA: Diagnosis not present

## 2016-05-11 DIAGNOSIS — J939 Pneumothorax, unspecified: Secondary | ICD-10-CM

## 2016-05-11 DIAGNOSIS — Z09 Encounter for follow-up examination after completed treatment for conditions other than malignant neoplasm: Secondary | ICD-10-CM

## 2016-05-11 DIAGNOSIS — Z681 Body mass index (BMI) 19 or less, adult: Secondary | ICD-10-CM

## 2016-05-11 DIAGNOSIS — I10 Essential (primary) hypertension: Secondary | ICD-10-CM | POA: Diagnosis not present

## 2016-05-11 DIAGNOSIS — I493 Ventricular premature depolarization: Secondary | ICD-10-CM | POA: Diagnosis not present

## 2016-05-11 DIAGNOSIS — D62 Acute posthemorrhagic anemia: Secondary | ICD-10-CM | POA: Diagnosis not present

## 2016-05-11 DIAGNOSIS — Z79899 Other long term (current) drug therapy: Secondary | ICD-10-CM

## 2016-05-11 DIAGNOSIS — I719 Aortic aneurysm of unspecified site, without rupture: Secondary | ICD-10-CM | POA: Diagnosis not present

## 2016-05-11 DIAGNOSIS — Z9689 Presence of other specified functional implants: Secondary | ICD-10-CM

## 2016-05-11 DIAGNOSIS — R079 Chest pain, unspecified: Secondary | ICD-10-CM | POA: Diagnosis not present

## 2016-05-11 DIAGNOSIS — R0602 Shortness of breath: Secondary | ICD-10-CM | POA: Diagnosis not present

## 2016-05-11 DIAGNOSIS — J9382 Other air leak: Secondary | ICD-10-CM | POA: Diagnosis not present

## 2016-05-11 DIAGNOSIS — J9811 Atelectasis: Secondary | ICD-10-CM

## 2016-05-11 DIAGNOSIS — Z87891 Personal history of nicotine dependence: Secondary | ICD-10-CM | POA: Diagnosis not present

## 2016-05-11 DIAGNOSIS — J439 Emphysema, unspecified: Secondary | ICD-10-CM | POA: Diagnosis not present

## 2016-05-11 DIAGNOSIS — I340 Nonrheumatic mitral (valve) insufficiency: Secondary | ICD-10-CM | POA: Diagnosis present

## 2016-05-11 DIAGNOSIS — T17808A Unspecified foreign body in other parts of respiratory tract causing other injury, initial encounter: Secondary | ICD-10-CM | POA: Diagnosis not present

## 2016-05-11 DIAGNOSIS — J95812 Postprocedural air leak: Secondary | ICD-10-CM | POA: Diagnosis not present

## 2016-05-11 HISTORY — PX: VIDEO ASSISTED THORACOSCOPY (VATS)/WEDGE RESECTION: SHX6174

## 2016-05-11 HISTORY — PX: LYMPH NODE DISSECTION: SHX5087

## 2016-05-11 HISTORY — PX: VIDEO BRONCHOSCOPY: SHX5072

## 2016-05-11 HISTORY — PX: LOBECTOMY: SHX5089

## 2016-05-11 HISTORY — PX: STAPLING OF BLEBS: SHX6429

## 2016-05-11 HISTORY — DX: Acquired absence of lung (part of): Z90.2

## 2016-05-11 LAB — CBC
HCT: 38.4 % — ABNORMAL LOW (ref 39.0–52.0)
Hemoglobin: 12.8 g/dL — ABNORMAL LOW (ref 13.0–17.0)
MCH: 30.1 pg (ref 26.0–34.0)
MCHC: 33.3 g/dL (ref 30.0–36.0)
MCV: 90.4 fL (ref 78.0–100.0)
Platelets: 354 10*3/uL (ref 150–400)
RBC: 4.25 MIL/uL (ref 4.22–5.81)
RDW: 13.7 % (ref 11.5–15.5)
WBC: 8.9 10*3/uL (ref 4.0–10.5)

## 2016-05-11 LAB — URINALYSIS, ROUTINE W REFLEX MICROSCOPIC
Bilirubin Urine: NEGATIVE
Glucose, UA: NEGATIVE mg/dL
Hgb urine dipstick: NEGATIVE
Ketones, ur: NEGATIVE mg/dL
Leukocytes, UA: NEGATIVE
Nitrite: NEGATIVE
Protein, ur: NEGATIVE mg/dL
Specific Gravity, Urine: 1.019 (ref 1.005–1.030)
pH: 5 (ref 5.0–8.0)

## 2016-05-11 LAB — COMPREHENSIVE METABOLIC PANEL
ALT: 14 U/L — ABNORMAL LOW (ref 17–63)
AST: 21 U/L (ref 15–41)
Albumin: 3.7 g/dL (ref 3.5–5.0)
Alkaline Phosphatase: 106 U/L (ref 38–126)
Anion gap: 8 (ref 5–15)
BUN: 5 mg/dL — ABNORMAL LOW (ref 6–20)
CO2: 24 mmol/L (ref 22–32)
Calcium: 9.6 mg/dL (ref 8.9–10.3)
Chloride: 103 mmol/L (ref 101–111)
Creatinine, Ser: 0.66 mg/dL (ref 0.61–1.24)
GFR calc Af Amer: >60 mL/min (ref >60)
GFR calc non Af Amer: >60 mL/min (ref >60)
Glucose, Bld: 113 mg/dL — ABNORMAL HIGH (ref 65–99)
Potassium: 4.6 mmol/L (ref 3.5–5.1)
Sodium: 135 mmol/L (ref 135–145)
Total Bilirubin: 0.3 mg/dL (ref 0.3–1.2)
Total Protein: 7.1 g/dL (ref 6.5–8.1)

## 2016-05-11 LAB — POCT I-STAT 7, (LYTES, BLD GAS, ICA,H+H)
Acid-base deficit: 1 mmol/L (ref 0.0–2.0)
Bicarbonate: 24.3 mmol/L (ref 20.0–28.0)
Calcium, Ion: 1.28 mmol/L (ref 1.15–1.40)
HCT: 35 % — ABNORMAL LOW (ref 39.0–52.0)
Hemoglobin: 11.9 g/dL — ABNORMAL LOW (ref 13.0–17.0)
O2 Saturation: 97 %
Patient temperature: 36
Potassium: 4.1 mmol/L (ref 3.5–5.1)
Sodium: 139 mmol/L (ref 135–145)
TCO2: 26 mmol/L (ref 0–100)
pCO2 arterial: 39.4 mmHg (ref 32.0–48.0)
pH, Arterial: 7.394 (ref 7.350–7.450)
pO2, Arterial: 84 mmHg (ref 83.0–108.0)

## 2016-05-11 LAB — TYPE AND SCREEN
ABO/RH(D): AB POS
Antibody Screen: NEGATIVE

## 2016-05-11 LAB — APTT: aPTT: 29 seconds (ref 24–36)

## 2016-05-11 LAB — SURGICAL PCR SCREEN
MRSA, PCR: NEGATIVE
Staphylococcus aureus: POSITIVE — AB

## 2016-05-11 LAB — PROTIME-INR
INR: 1.03
Prothrombin Time: 13.5 seconds (ref 11.4–15.2)

## 2016-05-11 LAB — GLUCOSE, CAPILLARY: Glucose-Capillary: 107 mg/dL — ABNORMAL HIGH (ref 65–99)

## 2016-05-11 SURGERY — BRONCHOSCOPY, VIDEO-ASSISTED
Anesthesia: General | Site: Chest

## 2016-05-11 MED ORDER — SUGAMMADEX SODIUM 200 MG/2ML IV SOLN
INTRAVENOUS | Status: DC | PRN
Start: 2016-05-11 — End: 2016-05-11
  Administered 2016-05-11: 200 mg via INTRAVENOUS

## 2016-05-11 MED ORDER — DEXTROSE-NACL 5-0.45 % IV SOLN
INTRAVENOUS | Status: DC
  Administered 2016-05-12: 100 mL/h via INTRAVENOUS

## 2016-05-11 MED ORDER — ROCURONIUM BROMIDE 10 MG/ML (PF) SYRINGE
PREFILLED_SYRINGE | INTRAVENOUS | Status: DC | PRN
  Administered 2016-05-11 (×2): 10 mg via INTRAVENOUS
  Administered 2016-05-11: 20 mg via INTRAVENOUS
  Administered 2016-05-11: 50 mg via INTRAVENOUS
  Administered 2016-05-11: 10 mg via INTRAVENOUS
  Administered 2016-05-11: 20 mg via INTRAVENOUS
  Administered 2016-05-11: 10 mg via INTRAVENOUS

## 2016-05-11 MED ORDER — HYDROMORPHONE HCL 1 MG/ML IJ SOLN
0.2500 mg | INTRAMUSCULAR | Status: DC | PRN
  Administered 2016-05-11 (×4): 0.5 mg via INTRAVENOUS

## 2016-05-11 MED ORDER — MUPIROCIN 2 % EX OINT
TOPICAL_OINTMENT | CUTANEOUS | Status: AC
  Filled 2016-05-11: qty 22

## 2016-05-11 MED ORDER — ALBUMIN HUMAN 5 % IV SOLN
INTRAVENOUS | Status: DC | PRN
  Administered 2016-05-11: 11:00:00 via INTRAVENOUS

## 2016-05-11 MED ORDER — DEXTROSE 5 % IV SOLN
INTRAVENOUS | Status: AC
  Filled 2016-05-11: qty 1.5

## 2016-05-11 MED ORDER — INSULIN ASPART 100 UNIT/ML ~~LOC~~ SOLN
0.0000 [IU] | SUBCUTANEOUS | Status: DC
  Administered 2016-05-13: 2 [IU] via SUBCUTANEOUS

## 2016-05-11 MED ORDER — PROMETHAZINE HCL 25 MG/ML IJ SOLN: 6.2500 mg | INTRAMUSCULAR | Status: DC | PRN

## 2016-05-11 MED ORDER — PHENYLEPHRINE HCL 10 MG/ML IJ SOLN
INTRAMUSCULAR | Status: DC | PRN
  Administered 2016-05-11: 12:00:00 via INTRAVENOUS
  Administered 2016-05-11: 40 ug/min via INTRAVENOUS

## 2016-05-11 MED ORDER — BUPIVACAINE HCL 0.5 % IJ SOLN
INTRAMUSCULAR | Status: DC | PRN
  Administered 2016-05-11: 5 mL

## 2016-05-11 MED ORDER — 0.9 % SODIUM CHLORIDE (POUR BTL) OPTIME
TOPICAL | Status: DC | PRN
  Administered 2016-05-11: 2000 mL
  Administered 2016-05-11: 3000 mL
  Administered 2016-05-11: 1000 mL

## 2016-05-11 MED ORDER — BUPIVACAINE 0.5 % ON-Q PUMP SINGLE CATH 300 ML
300.0000 mL | INJECTION | Status: AC
  Filled 2016-05-11: qty 300

## 2016-05-11 MED ORDER — FENTANYL CITRATE (PF) 100 MCG/2ML IJ SOLN
INTRAMUSCULAR | Status: AC
  Filled 2016-05-11: qty 2

## 2016-05-11 MED ORDER — CARVEDILOL 3.125 MG PO TABS
3.1250 mg | ORAL_TABLET | Freq: Two times a day (BID) | ORAL | Status: DC
  Administered 2016-05-11: 3.125 mg via ORAL
  Filled 2016-05-11: qty 1

## 2016-05-11 MED ORDER — PROPOFOL 10 MG/ML IV BOLUS
INTRAVENOUS | Status: DC | PRN
  Administered 2016-05-11: 170 mg via INTRAVENOUS

## 2016-05-11 MED ORDER — EPHEDRINE SULFATE-NACL 50-0.9 MG/10ML-% IV SOSY
PREFILLED_SYRINGE | INTRAVENOUS | Status: DC | PRN
  Administered 2016-05-11: 5 mg via INTRAVENOUS

## 2016-05-11 MED ORDER — ACETAMINOPHEN 160 MG/5ML PO SOLN: 1000.0000 mg | Freq: Four times a day (QID) | ORAL | Status: AC

## 2016-05-11 MED ORDER — ONDANSETRON HCL 4 MG/2ML IJ SOLN
4.0000 mg | Freq: Four times a day (QID) | INTRAMUSCULAR | Status: DC | PRN
  Administered 2016-05-13: 4 mg via INTRAVENOUS
  Filled 2016-05-11: qty 2

## 2016-05-11 MED ORDER — MUPIROCIN 2 % EX OINT
1.0000 "application " | TOPICAL_OINTMENT | Freq: Two times a day (BID) | CUTANEOUS | Status: AC
  Administered 2016-05-11 – 2016-05-16 (×10): 1 via NASAL

## 2016-05-11 MED ORDER — PROPOFOL 10 MG/ML IV BOLUS
INTRAVENOUS | Status: AC
  Filled 2016-05-11: qty 20

## 2016-05-11 MED ORDER — PHENYLEPHRINE 40 MCG/ML (10ML) SYRINGE FOR IV PUSH (FOR BLOOD PRESSURE SUPPORT)
PREFILLED_SYRINGE | INTRAVENOUS | Status: AC
  Filled 2016-05-11: qty 10

## 2016-05-11 MED ORDER — SODIUM CHLORIDE 0.9 % IV SOLN
30.0000 meq | Freq: Every day | INTRAVENOUS | Status: DC | PRN
  Administered 2016-05-13: 30 meq via INTRAVENOUS
  Filled 2016-05-11 (×3): qty 15

## 2016-05-11 MED ORDER — MIDAZOLAM HCL 5 MG/5ML IJ SOLN
INTRAMUSCULAR | Status: DC | PRN
  Administered 2016-05-11: 2 mg via INTRAVENOUS

## 2016-05-11 MED ORDER — SODIUM CHLORIDE 0.9% FLUSH: 9.0000 mL | INTRAVENOUS | Status: DC | PRN

## 2016-05-11 MED ORDER — ROCURONIUM BROMIDE 50 MG/5ML IV SOSY
PREFILLED_SYRINGE | INTRAVENOUS | Status: AC
  Filled 2016-05-11: qty 5

## 2016-05-11 MED ORDER — HYDROMORPHONE HCL 1 MG/ML IJ SOLN
INTRAMUSCULAR | Status: AC
  Filled 2016-05-11: qty 1.5

## 2016-05-11 MED ORDER — ONDANSETRON HCL 4 MG/2ML IJ SOLN
INTRAMUSCULAR | Status: DC | PRN
  Administered 2016-05-11: 4 mg via INTRAVENOUS

## 2016-05-11 MED ORDER — LIDOCAINE HCL (PF) 1 % IJ SOLN
INTRAMUSCULAR | Status: AC
  Filled 2016-05-11: qty 30

## 2016-05-11 MED ORDER — DEXTROSE 5 % IV SOLN
1.5000 g | INTRAVENOUS | Status: AC
  Administered 2016-05-11: 1.5 g via INTRAVENOUS

## 2016-05-11 MED ORDER — DEXTROSE 5 % IV SOLN
1.5000 g | Freq: Two times a day (BID) | INTRAVENOUS | Status: AC
  Administered 2016-05-11 – 2016-05-12 (×2): 1.5 g via INTRAVENOUS
  Filled 2016-05-11 (×2): qty 1.5

## 2016-05-11 MED ORDER — EPHEDRINE 5 MG/ML INJ
INTRAVENOUS | Status: AC
  Filled 2016-05-11: qty 10

## 2016-05-11 MED ORDER — EPINEPHRINE PF 1 MG/ML IJ SOLN
INTRAMUSCULAR | Status: AC
  Filled 2016-05-11: qty 1

## 2016-05-11 MED ORDER — BUPIVACAINE 0.5 % ON-Q PUMP SINGLE CATH 300 ML
INJECTION | Status: DC | PRN
  Administered 2016-05-11: 300 mL

## 2016-05-11 MED ORDER — MIDAZOLAM HCL 2 MG/2ML IJ SOLN
INTRAMUSCULAR | Status: AC
  Filled 2016-05-11: qty 2

## 2016-05-11 MED ORDER — DIPHENHYDRAMINE HCL 12.5 MG/5ML PO ELIX: 12.5000 mg | ORAL_SOLUTION | Freq: Four times a day (QID) | ORAL | Status: DC | PRN

## 2016-05-11 MED ORDER — DEXTROSE 5 % IV SOLN
1.5000 g | Freq: Once | INTRAVENOUS | Status: AC
  Administered 2016-05-11: 1.5 g via INTRAVENOUS
  Filled 2016-05-11: qty 1.5

## 2016-05-11 MED ORDER — LEVALBUTEROL HCL 0.63 MG/3ML IN NEBU
0.6300 mg | INHALATION_SOLUTION | Freq: Four times a day (QID) | RESPIRATORY_TRACT | Status: DC
  Administered 2016-05-12 (×4): 0.63 mg via RESPIRATORY_TRACT
  Filled 2016-05-11 (×4): qty 3

## 2016-05-11 MED ORDER — FENTANYL 40 MCG/ML IV SOLN
INTRAVENOUS | Status: AC
  Filled 2016-05-11: qty 25

## 2016-05-11 MED ORDER — MUPIROCIN 2 % EX OINT
1.0000 "application " | TOPICAL_OINTMENT | Freq: Once | CUTANEOUS | Status: AC
  Administered 2016-05-11: 1 via TOPICAL

## 2016-05-11 MED ORDER — LACTATED RINGERS IV SOLN
INTRAVENOUS | Status: DC | PRN
  Administered 2016-05-11 (×3): via INTRAVENOUS

## 2016-05-11 MED ORDER — HYDROMORPHONE HCL 1 MG/ML IJ SOLN
INTRAMUSCULAR | Status: AC
  Filled 2016-05-11: qty 0.5

## 2016-05-11 MED ORDER — BUPIVACAINE HCL (PF) 0.5 % IJ SOLN
INTRAMUSCULAR | Status: AC
  Filled 2016-05-11: qty 10

## 2016-05-11 MED ORDER — PHENYLEPHRINE 40 MCG/ML (10ML) SYRINGE FOR IV PUSH (FOR BLOOD PRESSURE SUPPORT)
PREFILLED_SYRINGE | INTRAVENOUS | Status: DC | PRN
  Administered 2016-05-11 (×5): 80 ug via INTRAVENOUS

## 2016-05-11 MED ORDER — SUGAMMADEX SODIUM 200 MG/2ML IV SOLN
INTRAVENOUS | Status: AC
  Filled 2016-05-11: qty 2

## 2016-05-11 MED ORDER — FENTANYL CITRATE (PF) 100 MCG/2ML IJ SOLN
INTRAMUSCULAR | Status: DC | PRN
  Administered 2016-05-11 (×4): 50 ug via INTRAVENOUS
  Administered 2016-05-11: 100 ug via INTRAVENOUS

## 2016-05-11 MED ORDER — BISACODYL 5 MG PO TBEC
10.0000 mg | DELAYED_RELEASE_TABLET | Freq: Every day | ORAL | Status: DC
  Administered 2016-05-11 – 2016-05-20 (×7): 10 mg via ORAL
  Filled 2016-05-11 (×9): qty 2

## 2016-05-11 MED ORDER — DIPHENHYDRAMINE HCL 50 MG/ML IJ SOLN: 12.5000 mg | Freq: Four times a day (QID) | INTRAMUSCULAR | Status: DC | PRN

## 2016-05-11 MED ORDER — FENTANYL 40 MCG/ML IV SOLN
INTRAVENOUS | Status: DC
  Administered 2016-05-11: 16:00:00 via INTRAVENOUS
  Administered 2016-05-12: 163 ug via INTRAVENOUS
  Administered 2016-05-12: 45 ug via INTRAVENOUS
  Administered 2016-05-12: 150 ug via INTRAVENOUS
  Administered 2016-05-12: 60 ug via INTRAVENOUS
  Administered 2016-05-12: 170 ug via INTRAVENOUS
  Administered 2016-05-12: 75 ug via INTRAVENOUS
  Administered 2016-05-12: 165 ug via INTRAVENOUS
  Administered 2016-05-13: 45 ug via INTRAVENOUS
  Administered 2016-05-13: 0 ug via INTRAVENOUS
  Administered 2016-05-13: 60 ug via INTRAVENOUS
  Filled 2016-05-11: qty 25

## 2016-05-11 MED ORDER — SENNOSIDES-DOCUSATE SODIUM 8.6-50 MG PO TABS
1.0000 | ORAL_TABLET | Freq: Every day | ORAL | Status: DC
  Administered 2016-05-11 – 2016-05-21 (×6): 1 via ORAL
  Filled 2016-05-11 (×7): qty 1

## 2016-05-11 MED ORDER — CARVEDILOL 3.125 MG PO TABS
ORAL_TABLET | ORAL | Status: AC
  Filled 2016-05-11: qty 1

## 2016-05-11 MED ORDER — LIDOCAINE 2% (20 MG/ML) 5 ML SYRINGE
INTRAMUSCULAR | Status: AC
  Filled 2016-05-11: qty 5

## 2016-05-11 MED ORDER — SUCCINYLCHOLINE CHLORIDE 200 MG/10ML IV SOSY
PREFILLED_SYRINGE | INTRAVENOUS | Status: AC
  Filled 2016-05-11: qty 10

## 2016-05-11 MED ORDER — ONDANSETRON HCL 4 MG/2ML IJ SOLN
INTRAMUSCULAR | Status: AC
  Filled 2016-05-11: qty 2

## 2016-05-11 MED ORDER — FENTANYL CITRATE (PF) 100 MCG/2ML IJ SOLN
INTRAMUSCULAR | Status: AC
  Filled 2016-05-11: qty 4

## 2016-05-11 MED ORDER — NALOXONE HCL 0.4 MG/ML IJ SOLN: 0.4000 mg | INTRAMUSCULAR | Status: DC | PRN

## 2016-05-11 MED ORDER — OXYCODONE HCL 5 MG PO TABS
5.0000 mg | ORAL_TABLET | ORAL | Status: DC | PRN
  Administered 2016-05-12 – 2016-05-24 (×42): 10 mg via ORAL
  Filled 2016-05-11 (×43): qty 2

## 2016-05-11 MED ORDER — ACETAMINOPHEN 500 MG PO TABS
1000.0000 mg | ORAL_TABLET | Freq: Four times a day (QID) | ORAL | Status: AC
  Administered 2016-05-12 – 2016-05-16 (×17): 1000 mg via ORAL
  Filled 2016-05-11 (×15): qty 2

## 2016-05-11 MED ORDER — CHLORHEXIDINE GLUCONATE CLOTH 2 % EX PADS
6.0000 | MEDICATED_PAD | Freq: Every day | CUTANEOUS | Status: AC
  Administered 2016-05-11 – 2016-05-15 (×5): 6 via TOPICAL

## 2016-05-11 SURGICAL SUPPLY — 93 items
APPLICATOR TIP COSEAL (VASCULAR PRODUCTS) IMPLANT
APPLICATOR TIP EXT COSEAL (VASCULAR PRODUCTS) IMPLANT
BLADE SURG 11 STRL SS (BLADE) IMPLANT
BRUSH CYTOL CELLEBRITY 1.5X140 (MISCELLANEOUS) IMPLANT
CANISTER SUCTION 2500CC (MISCELLANEOUS) ×3 IMPLANT
CATH KIT ON Q 5IN SLV (PAIN MANAGEMENT) IMPLANT
CATH KIT ON-Q SILVERSOAK 5IN (CATHETERS) ×3 IMPLANT
CATH THORACIC 28FR (CATHETERS) ×3 IMPLANT
CATH THORACIC 36FR (CATHETERS) IMPLANT
CATH THORACIC 36FR RT ANG (CATHETERS) IMPLANT
CLIP TI MEDIUM 6 (CLIP) ×3 IMPLANT
CONN ST 1/4X3/8  BEN (MISCELLANEOUS) ×2
CONN ST 1/4X3/8 BEN (MISCELLANEOUS) ×4 IMPLANT
CONT SPEC 4OZ CLIKSEAL STRL BL (MISCELLANEOUS) ×45 IMPLANT
COVER TABLE BACK 60X90 (DRAPES) ×3 IMPLANT
DERMABOND ADVANCED (GAUZE/BANDAGES/DRESSINGS) ×1
DERMABOND ADVANCED .7 DNX12 (GAUZE/BANDAGES/DRESSINGS) ×2 IMPLANT
DRAIN CHANNEL 28F RND 3/8 FF (WOUND CARE) ×3 IMPLANT
DRAIN CHANNEL 32F RND 10.7 FF (WOUND CARE) IMPLANT
DRAPE LAPAROSCOPIC ABDOMINAL (DRAPES) ×3 IMPLANT
DRAPE WARM FLUID 44X44 (DRAPE) IMPLANT
DRILL BIT 7/64X5 (BIT) IMPLANT
DRSG TEGADERM 4X4.75 (GAUZE/BANDAGES/DRESSINGS) ×3 IMPLANT
ELECT BLADE 4.0 EZ CLEAN MEGAD (MISCELLANEOUS) ×3
ELECT BLADE 6.5 EXT (BLADE) ×6 IMPLANT
ELECT REM PT RETURN 9FT ADLT (ELECTROSURGICAL) ×3
ELECTRODE BLDE 4.0 EZ CLN MEGD (MISCELLANEOUS) ×2 IMPLANT
ELECTRODE REM PT RTRN 9FT ADLT (ELECTROSURGICAL) ×2 IMPLANT
FORCEPS BIOP RJ4 1.8 (CUTTING FORCEPS) IMPLANT
GAUZE SPONGE 4X4 12PLY STRL (GAUZE/BANDAGES/DRESSINGS) ×3 IMPLANT
GLOVE BIO SURGEON STRL SZ 6.5 (GLOVE) ×18 IMPLANT
GLOVE BIOGEL PI IND STRL 6.5 (GLOVE) ×12 IMPLANT
GLOVE BIOGEL PI INDICATOR 6.5 (GLOVE) ×6
GLOVE INDICATOR 7.0 STRL GRN (GLOVE) ×3 IMPLANT
GOWN STRL REUS W/ TWL LRG LVL3 (GOWN DISPOSABLE) ×14 IMPLANT
GOWN STRL REUS W/TWL LRG LVL3 (GOWN DISPOSABLE) ×7
KIT BASIN OR (CUSTOM PROCEDURE TRAY) ×3 IMPLANT
KIT CLEAN ENDO COMPLIANCE (KITS) ×3 IMPLANT
KIT ROOM TURNOVER OR (KITS) ×3 IMPLANT
KIT SUCTION CATH 14FR (SUCTIONS) ×3 IMPLANT
MARKER SKIN DUAL TIP RULER LAB (MISCELLANEOUS) IMPLANT
NEEDLE BIOPSY TRANSBRONCH 21G (NEEDLE) IMPLANT
NS IRRIG 1000ML POUR BTL (IV SOLUTION) ×15 IMPLANT
OIL SILICONE PENTAX (PARTS (SERVICE/REPAIRS)) ×3 IMPLANT
PACK CHEST (CUSTOM PROCEDURE TRAY) ×3 IMPLANT
PAD ARMBOARD 7.5X6 YLW CONV (MISCELLANEOUS) ×9 IMPLANT
PASSER SUT SWANSON 36MM LOOP (INSTRUMENTS) ×3 IMPLANT
POUCH ENDO CATCH II 15MM (MISCELLANEOUS) ×3 IMPLANT
RELOAD GREEN ECHELON 45 (STAPLE) ×6 IMPLANT
SCISSORS LAP 5X35 DISP (ENDOMECHANICALS) IMPLANT
SEALANT SURG COSEAL 4ML (VASCULAR PRODUCTS) IMPLANT
SEALANT SURG COSEAL 8ML (VASCULAR PRODUCTS) ×6 IMPLANT
SOLUTION ANTI FOG 6CC (MISCELLANEOUS) ×3 IMPLANT
SPONGE GAUZE 4X4 12PLY STER LF (GAUZE/BANDAGES/DRESSINGS) ×3 IMPLANT
STAPLE RELOAD 2.5MM WHITE (STAPLE) ×27 IMPLANT
STAPLE RELOAD 45MM GOLD (STAPLE) ×30 IMPLANT
STAPLER ECHELON POWERED (MISCELLANEOUS) ×3 IMPLANT
STAPLER VASCULAR ECHELON 35 (CUTTER) ×3 IMPLANT
SUT PROLENE 3 0 SH DA (SUTURE) IMPLANT
SUT PROLENE 4 0 RB 1 (SUTURE)
SUT PROLENE 4-0 RB1 .5 CRCL 36 (SUTURE) IMPLANT
SUT SILK  1 MH (SUTURE) ×7
SUT SILK 1 MH (SUTURE) ×14 IMPLANT
SUT SILK 1 TIES 10X30 (SUTURE) IMPLANT
SUT SILK 2 0 SH (SUTURE) IMPLANT
SUT SILK 2 0SH CR/8 30 (SUTURE) IMPLANT
SUT SILK 3 0 SH 30 (SUTURE) ×6 IMPLANT
SUT SILK 3 0SH CR/8 30 (SUTURE) IMPLANT
SUT STEEL 1 (SUTURE) IMPLANT
SUT VIC AB 0 CTX 18 (SUTURE) IMPLANT
SUT VIC AB 1 CTX 18 (SUTURE) ×6 IMPLANT
SUT VIC AB 1 CTX 36 (SUTURE) ×1
SUT VIC AB 1 CTX36XBRD ANBCTR (SUTURE) ×2 IMPLANT
SUT VIC AB 2-0 CTX 36 (SUTURE) ×3 IMPLANT
SUT VIC AB 2-0 UR6 27 (SUTURE) IMPLANT
SUT VIC AB 3-0 SH 8-18 (SUTURE) IMPLANT
SUT VIC AB 3-0 X1 27 (SUTURE) ×3 IMPLANT
SUT VICRYL 0 UR6 27IN ABS (SUTURE) IMPLANT
SUT VICRYL 2 TP 1 (SUTURE) ×6 IMPLANT
SWAB COLLECTION DEVICE MRSA (MISCELLANEOUS) IMPLANT
SYR 20ML ECCENTRIC (SYRINGE) ×3 IMPLANT
SYSTEM SAHARA CHEST DRAIN ATS (WOUND CARE) ×3 IMPLANT
TAPE CLOTH SURG 4X10 WHT LF (GAUZE/BANDAGES/DRESSINGS) ×3 IMPLANT
TAPE UMBILICAL COTTON 1/8X30 (MISCELLANEOUS) ×3 IMPLANT
TOWEL OR 17X24 6PK STRL BLUE (TOWEL DISPOSABLE) ×6 IMPLANT
TOWEL OR 17X26 10 PK STRL BLUE (TOWEL DISPOSABLE) ×6 IMPLANT
TRAP SPECIMEN MUCOUS 40CC (MISCELLANEOUS) IMPLANT
TRAY FOLEY CATH 16FRSI W/METER (SET/KITS/TRAYS/PACK) ×3 IMPLANT
TUBE ANAEROBIC SPECIMEN COL (MISCELLANEOUS) IMPLANT
TUBE CONNECTING 20X1/4 (TUBING) ×9 IMPLANT
TUNNELER SHEATH ON-Q 11GX8 DSP (PAIN MANAGEMENT) ×3 IMPLANT
WATER STERILE IRR 1000ML POUR (IV SOLUTION) ×6 IMPLANT
YANKAUER SUCT BULB TIP NO VENT (SUCTIONS) ×3 IMPLANT

## 2016-05-11 NOTE — Anesthesia Procedure Notes (Signed)
Procedure Name: Intubation Date/Time: 05/11/2016 8:19 AM Performed by: Myna Bright Pre-anesthesia Checklist: Patient identified, Emergency Drugs available, Suction available and Patient being monitored Patient Re-evaluated:Patient Re-evaluated prior to inductionOxygen Delivery Method: Circle system utilized Preoxygenation: Pre-oxygenation with 100% oxygen Intubation Type: IV induction Ventilation: Mask ventilation without difficulty and Oral airway inserted - appropriate to patient size Laryngoscope Size: Mac and 4 Grade View: Grade I Tube type: Oral Tube size: 8.5 mm Number of attempts: 1 Airway Equipment and Method: Stylet Placement Confirmation: ETT inserted through vocal cords under direct vision,  positive ETCO2 and breath sounds checked- equal and bilateral Secured at: 22 cm Tube secured with: Tape Dental Injury: Teeth and Oropharynx as per pre-operative assessment

## 2016-05-11 NOTE — Anesthesia Procedure Notes (Signed)
Arterial Line Insertion Start/End1/19/2018 7:22 AM, 05/11/2016 8:27 AM Performed by: Duane Boston, anesthesiologist  Preanesthetic checklist: patient identified, IV checked, site marked, risks and benefits discussed, surgical consent, monitors and equipment checked, pre-op evaluation and timeout performed brachial was placed Catheter size: 20 Fr Hand hygiene performed , maximum sterile barriers used  and Seldinger technique used  Attempts: 1 Procedure performed without using ultrasound guided technique. Following insertion, dressing applied and Biopatch. Patient tolerated the procedure well with no immediate complications.

## 2016-05-11 NOTE — H&P (Signed)
[address]       [place] [address]             [phone]                    William K Swenson [name] [place] Medical Record #[id number] Date of Birth: 03-28-1956  Referring: [name], MD Primary Care: [name], MD  Chief Complaint:    Left Lower Lobe Lung Cancer and AAA( stented)    History of Present Illness:    William Kales [name] 61 y.o. male is seen in [facility]  for  evaluation of lung mass and follow up after needle biopsy  . Patient has long history tobacco use, stopped in November  . He had  a stent graft of a large abdominal aneurysm last month  . PFTs and CPX testing  and PET scan were done  for evaluation of his left lung mass. He's radiographic findings were reviewed in multidisciplinary thoracic oncology conference . He is progressing well after his abdominal stent graft.  Because of a low diffusion capacity further pulmonary function testing was done including CPX test, results below , Exercise testing with gas exchange demonstrates a low-normal peak VO2 of 22.1 ml/kg/min (80% of the age/gender/weight matched sedentary norms).   Needle biopsy of left lung mass has been completed : FINAL DIAGNOSIS Diagnosis Lung, needle/core biopsy(ies), Left lower lobe - ADENOCARCINOMA. - SEE COMMENT. Microscopic Comment  Current Activity/ Functional Status:  Patient is independent with mobility/ambulation, transfers, ADL's, IADL's.   Zubrod Score: At the time of surgery this patient's most appropriate activity status/level should be described as: '[]'$     0    Normal activity, no symptoms '[x]'$     1    Restricted in physical strenuous activity but ambulatory, able to do out light work '[]'$     2    Ambulatory and capable of self care, unable to do work activities, up and about               >50 % of waking hours                              '[]'$     3    Only limited self care, in bed greater than 50% of waking hours '[]'$     4    Completely disabled, no self  care, confined to bed or chair '[]'$     5    Moribund   Past Medical History:  Diagnosis Date  . AAA (abdominal aortic aneurysm) ([place])   . History of hepatitis B   . HNP (herniated nucleus pulposus), lumbar    L4 with radiculopathy  . Mass of left lung   . Varicose veins of legs   Mitral insufficiency on echo 02/2016  Past Surgical History:  Procedure Laterality Date  . COLONOSCOPY    . INGUINAL HERNIA REPAIR Left 1975   Left inguinal hernia  . INGUINAL HERNIA REPAIR Right   . LUMBAR LAMINECTOMY  October 22, 2012    Family History  Problem Relation Age of Onset  . Diabetes Mother     Social History   Social History  . Marital status: Married    Spouse name: N/A  . Number of children: N/A  . Years of education: N/A    Social History Main Topics  . Smoking status: Current Every Day Smoker    Packs/day:  1.00    Types: Cigarettes  . Smokeless tobacco: Never Used  . Alcohol use 7.2 oz/week    12 Cans of beer per week  . 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 Sexual activity: Not on file   Other Topics Concern   Works as school custodian     History  Smoking Status  . Former Smoker  . Packs/day: 1.00  . Types: Cigarettes  . Quit date: 03/05/2016  Smokeless Tobacco  . Never Used    History  Alcohol Use  . 7.2 oz/week  . 12 Cans of beer per week     Allergies  Allergen Reactions  . Tramadol Nausea And Vomiting    No current outpatient prescriptions on file.   No current facility-administered medications for this visit.    Facility-Administered Medications Ordered in Other Visits  Medication Dose Route Frequency Provider Last Rate Last Dose  . fludeoxyglucose F - 18 (FDG) injection 7.5 millicurie  7.5 millicurie Intravenous Once PRN Kalman Jewels, MD            Review of Systems:     Cardiac Review of Systems: Y or N  Chest Pain [n    ]  Resting SOB [ n  ] Exertional SOB  [ y ]  Orthopnea n[  ]   Pedal  Edema [ n  ]    Palpitations [n  ] Syncope  [n  ]   Presyncope [  n ]  General Review of Systems: [Y] = yes [  ]=no Constitional: recent weight change [ n ];  Wt loss over the last 3 months [   ] anorexia [  ]; fatigue [ y ]; nausea [  ]; night sweats [  ]; fever [  ]; or chills [  ];          Dental: poor dentition[  ]; Last Dentist visit:   Eye : blurred vision [  ]; diplopia [   ]; vision changes [  ];  Amaurosis fugax[  ]; Resp: cough Blue.Reese  ];  wheezing[ y ];  hemoptysis[  n]; shortness of breath[y  ]; paroxysmal nocturnal dyspnea[  ]; dyspnea on exertion[  ]; or orthopnea[  ];  GI:  gallstones[  ], vomiting[  ];  dysphagia[  ]; melena[  ];  hematochezia [  ]; heartburn[  ];   Hx of  Colonoscopy[ y ]; GU: kidney stones [  ]; hematuria[  ];   dysuria [  ];  nocturia[  ];  history of     obstruction [  ]; urinary frequency [  ]             Skin: rash, swelling[  ];, hair loss[  ];  peripheral edema[  ];  or itching[  ]; Musculosketetal: myalgias[  ];  joint swelling[  ];  joint erythema[  ];  joint pain[  ];  back pain[  ];  Heme/Lymph: bruising[  ];  bleeding[  ];  anemia[  ];  Neuro: TIA[n  ];  headaches[ n ];  stroke[  ];  vertigo[  ];  seizures[  ];   paresthesias[  ];  difficulty walking[  ];  Psych:depression[  ]; anxiety[  ];  Endocrine: diabetes[ n ];  thyroid dysfunction[n  ];  Immunizations: Flu up to date [ n ]; Pneumococcal up to date [n  ];  Other:  Physical Exam: BP Marland Kitchen)  154/79   Pulse 65   Temp 98.1 F (36.7 C) (Oral)   Resp 20   Ht '6\' 3"'$  (1.905 m)   Wt 148 lb (67.1 kg)   SpO2 100%   BMI 18.50 kg/m   PHYSICAL EXAMINATION: General appearance: alert, cooperative and appears older than stated age Head: Normocephalic, without obvious abnormality, atraumatic Neck: no adenopathy, no carotid bruit, no JVD, supple, symmetrical, trachea midline and thyroid not enlarged, symmetric, no tenderness/mass/nodules Lymph nodes: Cervical, supraclavicular, and axillary nodes  normal. Resp: clear to auscultation bilaterally Back: symmetric, no curvature. ROM normal. No CVA tenderness. Cardio: regular rate and rhythm, S1, S2 normal, 2/6 murmur of mitral insufficiency, click, rub or gallop GI: soft, non-tender; bowel sounds normal; no masses,  no organomegaly Extremities: extremities normal, atraumatic, no cyanosis or edema and clubbing of all fingers,  Neurologic: Grossly normal palpable dt and pt pulses patient has significant bruit heard over the abdomen and even radiating into the lower back bilaterally.   Diagnostic Studies & Laboratory data:     Recent Radiology Findings:  Nm Pet Image Initial (pi) Skull Base To Thigh  Result Date: 03/07/2016 CLINICAL DATA:  Initial treatment strategy for left lung mass. EXAM: NUCLEAR MEDICINE PET SKULL BASE TO THIGH TECHNIQUE: 7.5 mCi F-18 FDG was injected intravenously. Full-ring PET imaging was performed from the skull base to thigh after the radiotracer. CT data was obtained and used for attenuation correction and anatomic localization. FASTING BLOOD GLUCOSE:  Value: 114 mg/dl COMPARISON:  Chest CT 02/16/2016 FINDINGS: NECK No hypermetabolic lymph nodes in the neck. CHEST No hypermetabolic mediastinal or hilar nodes. The left lower lobe lung mass measures 3.8 x 3.2 cm and is metabolically active with SUV max of 9.3. Extensive pleural calcifications noted in the right lung posteriorly likely due to prior hemothorax or old empyema. Small scattered subpleural pulmonary nodules are stable. Recommend attention on future scans. Stable emphysematous changes and pulmonary scarring. 4 cm ascending aortic aneurysm. ABDOMEN/PELVIS No abnormal hypermetabolic activity within the liver, pancreas, adrenal glands, or spleen. No hypermetabolic lymph nodes in the abdomen or pelvis. New line stable 6 cm infrarenal abdominal aortic aneurysm. SKELETON No focal hypermetabolic activity to suggest skeletal metastasis. IMPRESSION: 1. 3.8 x 3.2 cm superior  segment left lower lobe lung mass is hypermetabolic and consistent with primary lung neoplasm. No mediastinal or hilar lymphadenopathy and no findings to suggest metastatic disease. 2. A few small scattered pulmonary nodules are likely benign but will require surveillance. 3. Stable ascending aortic aneurysm and infrarenal abdominal aortic aneurysm. Electronically Signed   By: Marijo Sanes M.D.   On: 03/07/2016 16:54    I have independently reviewed the above radiology studies  and reviewed the findings with the patient.   Ct Chest W Contrast  Result Date: 02/16/2016 CLINICAL DATA:  Cough. Tobacco dependence. Weight loss. History of abdominal aortic aneurysm. Question lung cancer. EXAM: CT CHEST, ABDOMEN, AND PELVIS WITH CONTRAST TECHNIQUE: Multidetector CT imaging of the chest, abdomen and pelvis was performed following the standard protocol during bolus administration of intravenous contrast. CONTRAST:  124m ISOVUE-300 IOPAMIDOL (ISOVUE-300) INJECTION 61% [facility] [facility] [place] [facility] [address] Results: Creatinine 0.7 mg/dL. COMPARISON:  Chest radiograph of 12/06/2006.  No prior CTs. FINDINGS: CT CHEST FINDINGS Cardiovascular: Aortic and branch vessel atherosclerosis. Tortuous thoracic aorta. Borderline ascending thoracic aortic aneurysm, 4.0 cm. The proximal descending segment is borderline prominent at 3.4 cm. Cardiomegaly, accentuated by a mild pectus excavatum deformity. No pericardial effusion. No  central pulmonary embolism, on this non-dedicated study. Mediastinum/Nodes: No supraclavicular adenopathy. 1.2 cm hypo attenuating left thyroid nodule is nonspecific. No mediastinal or hilar adenopathy. Mild soft tissue thickening anterior to the ascending aorta favored to represent a pericardial recess. Lungs/Pleura: No pleural fluid. Calcified right-sided pleural plaque. Moderate centrilobular and paraseptal emphysema. Probable secretions in the trachea. Lower lobe  predominant bronchial wall thickening. Minimal motion degradation. Inferior right middle lobe pleural or subpleural 5 mm nodule on image 121/series 6. 2 mm medial right apex pulmonary nodule on image 56/series 6. A subpleural left upper lobe pulmonary nodule measures 5 mm on image 60/series 6. Superior segment left lower lobe lung mass measures 3.8 by 3.2 by 3.1 cm on transverse image 99/series 6 and sagittal image 86. This contacts the left major fissure over an approximately 1.7 cm span. Subpleural posterior left upper lobe 4 mm nodule on image 44/series 6. Musculoskeletal: No acute osseous abnormality. CT ABDOMEN PELVIS FINDINGS Hepatobiliary: Probable hepatic steatosis diffusely. More focal steatosis adjacent the falciform ligament. Scattered tiny low-density liver lesions are too small to characterize. Mild hepatomegaly at 18.9 cm. Normal gallbladder, without biliary ductal dilatation. Pancreas: Normal, without mass or ductal dilatation. Spleen: Normal in size, without focal abnormality. Adrenals/Urinary Tract: Normal adrenal glands. Too small to characterize lesion in the interpolar left kidney is likely a cyst. Normal right kidney, without hydronephrosis. Normal urinary bladder. Stomach/Bowel: Normal stomach, without wall thickening. Normal colon, appendix, and terminal ileum. Normal small bowel. Vascular/Lymphatic: Infrarenal abdominal aortic aneurysm, maximally 5.9 x 6.3 cm on image 150/series 2. Extensive wall thrombus. No periaortic hemorrhage. No extension into the iliacs. Both common iliac arteries are mildly ectatic at 1.3 cm. No abdominopelvic adenopathy. Reproductive: Mild prostatomegaly.  Small left hydrocele. Other: No significant free fluid. No evidence of omental or peritoneal disease. Musculoskeletal: Advanced degenerative disc disease at the L4-5 level. IMPRESSION: 1. Superior segment left lower lobe lung mass, consistent with primary bronchogenic carcinoma. No evidence of thoracic nodal or  extra thoracic metastasis. 2. Infrarenal abdominal aortic aneurysm, maximally 6.3 cm. Vascular surgery consultation recommended due to increased risk of rupture for AAA >5.5 cm. This recommendation follows ACR consensus guidelines: White Paper of the ACR Incidental Findings Committee II on Vascular Findings. J Am Coll Radiol 2013; 10:789-794. These results (impressions 1 and 2) will be called to the ordering clinician or representative by the Radiologist Assistant, and communication documented in the PACS or zVision Dashboard. 3. Coronary artery atherosclerosis. Aortic atherosclerosis. 4. Borderline ascending thoracic aortic aneurysm. 5. Right-sided pleural calcifications, suggesting prior empyema or hemothorax. 6. Probable hepatic steatosis. 7. Prostatomegaly. 8. Bilateral common iliac artery ectasia. Electronically Signed   By: Abigail Miyamoto M.D.   On: 02/16/2016 11:24    I have independently reviewed the above radiology studies  and reviewed the findings with the patient.   Recent Lab Findings: Lab Results  Component Value Date   WBC 8.2 03/06/2016   HGB 14.4 03/06/2016   HCT 42.4 03/06/2016   PLT 343 03/06/2016   GLUCOSE 104 (H) 03/06/2016   ALT 11 (L) 03/06/2016   AST 23 03/06/2016   NA 133 (L) 03/06/2016   K 4.0 03/06/2016   CL 100 (L) 03/06/2016   CREATININE 0.65 03/06/2016   BUN 7 03/06/2016   CO2 25 03/06/2016   INR 1.11 03/06/2016   PFT's FEV1 2.53   68% DLCO 14.57  37%  Conclusions: Although there is airway obstruction and a diffusion defect suggesting emphysema, the absence of overinflation is inconsistent with that  diagnosis. In view of the severity of the diffusion defect, studies with exercise would be helpful to evaluate the presence of hypoxemia.   6 Minute Walk Test Results  Patient:            William WENZLER [name] Date:                03/08/2016   Supplemental O2 during test? NO                                       Baseline                                  End  Time                            1106                                        1112 Heartrate                     70                                            81 Dyspnea                      0                                              0 Fatigue                        0                                              0 O2 sat                          98%                                         99% Blood pressure            150/86                                  158/   Patient ambulated at a NORMAL pace for a total distance of 1176 feet with NO stops.  Ambulation was limited primarily due to N/A  Overall the test was tolerated VERY WELL with no complaints of any discomfort  Cardiopulmonary exercise test  Order: [id number]  Status:  Final result Visible to patient:  No (Not Released) Next appt:  Today at  11:30 AM in Cardiothoracic Surgery [name], MD) Dx:  Cancer of lower lobe of left lung [facility])  Narrative   Referred for: Risk Stratification for Lung Resection  Procedure: This patient underwent staged symptom-limited exercise testing using a individualized bike protocol with expired gas analysis metabolic evaluation during exercise.  Demographics  Age: 77 Ht. (in.) 67 Wt. (lb) 146 BMI: 18.2   Predicted Peak VO2: 27.7  Gender: Male Ht (cm) 190.5 Wt. (kg) 66.2    Results  Pre-Exercise PFTs   FVC 4.16 (91%)     FEV1 2.34 (67%)      FEV1/FVC 56 (72%)      MVV 77 (49%)      Exercise Time:  8:55    Watts: 84  RPE: 14  Reason stopped: Patient ended test due to severe urge to cough.   Additional symptoms: Dyspnea (5/10)  Resting HR: 110 Peak HR: 143  (91% age predicted max HR)  BP rest: 126/66 BP peak: 144/72  Peak VO2: 22.1 (80% predicted peak VO2)  VE/VCO2 slope: 43.4  OUES: 1.83  Peak RER: 1.12  Ventilatory Threshold: 18.4 (6% predicted or measured peak VO2)  Peak RR 39  Peak Ventilation: 73.6  VE/MVV:  96%  PETCO2 at peak: 26  O2pulse: 10  (83% predicted O2pulse)   Interpretation  Notes: Patient gave a very good effort. Pulse-oximetry remained did not decrease below 95%, which was at peak exercise. Exercise was performed on a cycle ergometer starting at [facility] and increasing by 12W/min. Patient has not taken medications in a week due to request to refill.  ECG: Resting ECG in sinus tachycardia. HR response appropriate. There were occasional PVCs with increasing exercise intensity, however no sustained arrhythmias and no ST-T changes. BP response midly blunted.   PFT: Pre-exercise spirometry demonstrates both restrictive and obstructive patterns with reduced FEV1 and FEV1/FVC. MVV below normal.   CPX: Exercise Capacity- Exercise testing with gas exchange demonstrates a low-normal peak VO2 of 22.1 ml/kg/min (80% of the age/gender/weight matched sedentary norms). The RER of 1.12 indicates a maximal effort.   Cardiovascular response- The O2pulse (a surrogate for stroke volume) increased linearly and appropriately reaching 10 ml/beat (83% predicted).   Ventilatory response- The VE/VCO2 slope is significantly elevated and indicates excessive dead space ventilation. The oxygen uptake efficiency slope (OUES) is reduced. The VO2 at the ventilatory threshold was normal at 67% of the predicted peak VO2. At peak exercise, the ventilation reached 96% of the measured MVV and breathing reserve was 3 indicating ventilatory limits were nearly approached. PETCO2 was At low at 26 mmHg during peak exercise.  Conclusion:Exercise testing with gas exchange demonstrates normal functional capacity when compared to matched sedentary norms. Patient is primarily ventilatory limited and is clearly of obstructive nature. VE/VCO2 is elevated and indicates significant dead space ventilation most likely due to obstructive lung disease. Patient's measured VO71mx is low-normal at 22.1 ml/kg/min and indicates relatively  little increased risk associated with lung resection. However, note patient's resting tachycardia (with consideration of no medication for one week prior to exercise).   Test, report and preliminary impression by: KLandis Martins MS, ACSM-RCEP 04/25/2016 3:25 PM  FINALIZED  PMarshell GarfinkelMD [facility] Pager [phone] 05/03/2016, 5:59 AM           Assessment / Plan:   Superior segment left lower lobe lung mass, consistent with primary bronchogenic carcinoma. No evidence of thoracic nodal or extra thoracic metastasis. 3.8 x 3.2 cm superior segment left  lower lobe lung mass is hypermetabolic Is biopsy-proven  Left lower lobe  ADENOCARCINOMA .  Marland Kitchen No mediastinal or hilar lymphadenopathy and no findings to suggest metastatic disease -  Clinicial stage IB disease (cT2a,cN0,cM0) I discussed with the patient  in detail the surgical options for treatment  primary carcinoma of the lung . Initially the Patient wished to consider radiation options and discussed with Dr Sondra Come . He is now no longer smoking and wishes to proceed with  surgical resection. His CPX testing is suitable for lobectomy. With his long term smoking history history of mitral regurgitation, and diffuse vascular disease we have had   cardiology formally see him for preoperative clearance.   The goals risks and alternatives of the planned surgical procedure bronchoscopy left video-assisted thoracoscopy and lung resection with node dissection have been discussed with the patient in detail. The risks of the procedure including death, infection, stroke, myocardial infarction, bleeding, blood transfusion have all been discussed specifically.  I have quoted William Kales [name] a 3 % of perioperative mortality and a complication rate as high as 40 %. The patient's questions have been answered.William Kales [name] is willing  to proceed with the planned procedure.   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

## 2016-05-11 NOTE — Anesthesia Procedure Notes (Signed)
Central Venous Catheter Insertion Performed by: Duane Boston, anesthesiologist Start/End1/19/2018 7:12 AM, 05/11/2016 7:20 AM Patient location: Pre-op. Preanesthetic checklist: patient identified, IV checked, site marked, risks and benefits discussed, surgical consent, monitors and equipment checked, pre-op evaluation, timeout performed and anesthesia consent Position: Trendelenburg Lidocaine 1% used for infiltration and patient sedated Hand hygiene performed , maximum sterile barriers used  and Seldinger technique used Catheter size: 8 Fr Total catheter length 16. Central line was placed.Double lumen Procedure performed using ultrasound guided technique. Ultrasound Notes:image(s) printed for medical record Attempts: 1 Following insertion, dressing applied, line sutured and Biopatch. Post procedure assessment: blood return through all ports, free fluid flow and no air  Patient tolerated the procedure well with no immediate complications.

## 2016-05-11 NOTE — Transfer of Care (Signed)
Immediate Anesthesia Transfer of Care Note  Patient: William Kales [name]  Procedure(s) Performed: Procedure(s): VIDEO BRONCHOSCOPY, LEFT LUNG (N/A) LEFT VIDEO ASSISTED THORACOSCOPY (VATS) (Left) LYMPH NODE DISSECTION (Left) LEFT LOWER LOBE LOBECTOMY AND LEFT UPPER LOBE  RESECTION AND PLACEMENT OF ON-Q (Left) STAPLING OF APICAL BLEB (Left)  Patient Location: PACU  Anesthesia Type:General  Level of Consciousness: awake, sedated and responds to stimulation  Airway & Oxygen Therapy: Patient Spontanous Breathing and Patient connected to face mask oxygen  Post-op Assessment: Report given to RN, Post -op Vital signs reviewed and stable and Patient moving all extremities  Post vital signs: Reviewed and stable  Last Vitals:  Vitals:   05/11/16 0557 05/11/16 1615  BP: (!) 154/79 130/78  Pulse: 65 (!) 32  Resp: 20 20  Temp: 36.7 C 36.7 C    Last Pain:  Vitals:   05/11/16 0557  TempSrc: Oral      Patients Stated Pain Goal: 3 (53/00/51 1021)  Complications: No apparent anesthesia complications

## 2016-05-11 NOTE — Anesthesia Procedure Notes (Signed)
Procedure Name: Intubation Date/Time: 05/11/2016 8:30 AM Performed by: Myna Bright Pre-anesthesia Checklist: Patient identified, Emergency Drugs available, Suction available and Patient being monitored Patient Re-evaluated:Patient Re-evaluated prior to inductionOxygen Delivery Method: Circle system utilized Preoxygenation: Pre-oxygenation with 100% oxygen Intubation Type: Inhalational induction with existing ETT Laryngoscope Size: Mac and 4 Grade View: Grade I Endobronchial tube: 41 Fr Number of attempts: 1 Airway Equipment and Method: Stylet and Fiberoptic brochoscope Placement Confirmation: ETT inserted through vocal cords under direct vision,  positive ETCO2 and breath sounds checked- equal and bilateral Tube secured with: Tape Dental Injury: Teeth and Oropharynx as per pre-operative assessment

## 2016-05-11 NOTE — Anesthesia Postprocedure Evaluation (Addendum)
Anesthesia Post Note  Patient: William SLOOP [name]  Procedure(s) Performed: Procedure(s) (LRB): VIDEO BRONCHOSCOPY, LEFT LUNG (N/A) LEFT VIDEO ASSISTED THORACOSCOPY (VATS) (Left) LYMPH NODE DISSECTION (Left) LEFT LOWER LOBE LOBECTOMY AND LEFT UPPER LOBE  RESECTION AND PLACEMENT OF ON-Q (Left) STAPLING OF APICAL BLEB (Left)  Patient location during evaluation: PACU Anesthesia Type: General Level of consciousness: sedated Pain management: pain level controlled Vital Signs Assessment: post-procedure vital signs reviewed and stable Respiratory status: spontaneous breathing and respiratory function stable Cardiovascular status: stable Anesthetic complications: no       Last Vitals:  Vitals:   05/11/16 1815 05/11/16 1820  BP: 123/68   Pulse: 78 76  Resp: 14 13  Temp:      Last Pain:  Vitals:   05/11/16 1820  TempSrc:   PainSc: Asleep                 Talen Poser DANIEL

## 2016-05-11 NOTE — Brief Op Note (Signed)
05/11/2016  3:44 PM  PATIENT:  William Kales [name]  61 y.o. male  PRE-OPERATIVE DIAGNOSIS:  LLL LUNG CANCER  POST-OPERATIVE DIAGNOSIS:  LLL LUNG CANCER  PROCEDURE:  Procedure(s): VIDEO BRONCHOSCOPY, LEFT LUNG (N/A) LEFT VIDEO ASSISTED THORACOSCOPY (VATS) (Left) LYMPH NODE DISSECTION (Left) LEFT LOWER LOBE LOBECTOMY AND LEFT UPPER LOBE  RESECTION AND PLACEMENT OF ON-Q (Left) STAPLING OF APICAL BLEB (Left)  SURGEON:  Surgeon(s) and Role:    * [name], MD - Primary  PHYSICIAN ASSISTANT:  Nicholes Rough, PA-C   ANESTHESIA:   general  EBL:  Total I/O In: 1250 [I.V.:1000; IV Piggyback:250] Out: 410 [Urine:260; Blood:150]  BLOOD ADMINISTERED:none  DRAINS: one blake drain and one chest tube   LOCAL MEDICATIONS USED:  MARCAINE  , On-Q  SPECIMEN:  Source of Specimen:  LEFT LOWER LOBE  AND LEFT UPPER LOBE, left upper lobe wedge, Apical Bleb  DISPOSITION OF SPECIMEN:  PATHOLOGY  COUNTS:  YES  TOURNIQUET:  * No tourniquets in log *  DICTATION: .Dragon Dictation  PLAN OF CARE: Admit to inpatient   PATIENT DISPOSITION:  ICU - extubated and stable.   Delay start of Pharmacological VTE agent (>24hrs) due to surgical blood loss or risk of bleeding: yes

## 2016-05-12 ENCOUNTER — Inpatient Hospital Stay (HOSPITAL_COMMUNITY): Payer: BLUE CROSS/BLUE SHIELD

## 2016-05-12 LAB — GLUCOSE, CAPILLARY
Glucose-Capillary: 102 mg/dL — ABNORMAL HIGH (ref 65–99)
Glucose-Capillary: 108 mg/dL — ABNORMAL HIGH (ref 65–99)
Glucose-Capillary: 117 mg/dL — ABNORMAL HIGH (ref 65–99)
Glucose-Capillary: 118 mg/dL — ABNORMAL HIGH (ref 65–99)
Glucose-Capillary: 120 mg/dL — ABNORMAL HIGH (ref 65–99)
Glucose-Capillary: 124 mg/dL — ABNORMAL HIGH (ref 65–99)
Glucose-Capillary: 124 mg/dL — ABNORMAL HIGH (ref 65–99)

## 2016-05-12 LAB — POCT I-STAT 3, ART BLOOD GAS (G3+)
Bicarbonate: 25.2 mmol/L (ref 20.0–28.0)
O2 Saturation: 97 %
Patient temperature: 97.8
TCO2: 27 mmol/L (ref 0–100)
pCO2 arterial: 42.9 mmHg (ref 32.0–48.0)
pH, Arterial: 7.375 (ref 7.350–7.450)
pO2, Arterial: 97 mmHg (ref 83.0–108.0)

## 2016-05-12 LAB — CBC
HCT: 32.9 % — ABNORMAL LOW (ref 39.0–52.0)
HEMOGLOBIN: 10.7 g/dL — AB (ref 13.0–17.0)
MCH: 29.6 pg (ref 26.0–34.0)
MCHC: 32.5 g/dL (ref 30.0–36.0)
MCV: 91.1 fL (ref 78.0–100.0)
PLATELETS: 282 10*3/uL (ref 150–400)
RBC: 3.61 MIL/uL — AB (ref 4.22–5.81)
RDW: 13.7 % (ref 11.5–15.5)
WBC: 9.6 10*3/uL (ref 4.0–10.5)

## 2016-05-12 LAB — BASIC METABOLIC PANEL
ANION GAP: 6 (ref 5–15)
BUN: <5 mg/dL — ABNORMAL LOW (ref 6–20)
CHLORIDE: 103 mmol/L (ref 101–111)
CO2: 25 mmol/L (ref 22–32)
Calcium: 8.4 mg/dL — ABNORMAL LOW (ref 8.9–10.3)
Creatinine, Ser: 0.59 mg/dL — ABNORMAL LOW (ref 0.61–1.24)
GFR calc Af Amer: >60 mL/min (ref >60)
Glucose, Bld: 134 mg/dL — ABNORMAL HIGH (ref 65–99)
POTASSIUM: 3.8 mmol/L (ref 3.5–5.1)
SODIUM: 134 mmol/L — AB (ref 135–145)

## 2016-05-12 MED ORDER — SODIUM CHLORIDE 0.9% FLUSH
10.0000 mL | Freq: Two times a day (BID) | INTRAVENOUS | Status: DC
  Administered 2016-05-12 – 2016-05-16 (×7): 10 mL
  Administered 2016-05-16: 20 mL
  Administered 2016-05-17 – 2016-05-18 (×3): 10 mL
  Administered 2016-05-18 – 2016-05-19 (×2): 20 mL
  Administered 2016-05-20 – 2016-05-21 (×3): 10 mL

## 2016-05-12 MED ORDER — ORAL CARE MOUTH RINSE
15.0000 mL | Freq: Two times a day (BID) | OROMUCOSAL | Status: DC
  Administered 2016-05-12 – 2016-05-24 (×15): 15 mL via OROMUCOSAL

## 2016-05-12 MED ORDER — LEVALBUTEROL HCL 0.63 MG/3ML IN NEBU
0.6300 mg | INHALATION_SOLUTION | Freq: Three times a day (TID) | RESPIRATORY_TRACT | Status: DC
  Filled 2016-05-12: qty 3

## 2016-05-12 MED ORDER — SODIUM CHLORIDE 0.9% FLUSH
10.0000 mL | INTRAVENOUS | Status: DC | PRN
  Administered 2016-05-21: 20 mL
  Filled 2016-05-12: qty 40

## 2016-05-12 NOTE — Progress Notes (Signed)
[address]       [place] [address]             [phone]        CARDIOTHORACIC SURGERY PROGRESS NOTE   R1 Day Post-Op Procedure(s) (LRB): VIDEO BRONCHOSCOPY, LEFT LUNG (N/A) LEFT VIDEO ASSISTED THORACOSCOPY (VATS) (Left) LYMPH NODE DISSECTION (Left) LEFT LOWER LOBE LOBECTOMY AND LEFT UPPER LOBE  RESECTION AND PLACEMENT OF ON-Q (Left) STAPLING OF APICAL BLEB (Left)  Subjective: Mild soreness in chest.  Denies SOB  Objective: Vital signs: BP Readings from Last 1 Encounters:  05/12/16 119/67   Pulse Readings from Last 1 Encounters:  05/12/16 60   Resp Readings from Last 1 Encounters:  05/12/16 13   Temp Readings from Last 1 Encounters:  05/12/16 97.2 F (36.2 C) (Oral)    Hemodynamics:    Physical Exam:  Rhythm:   sinus  Breath sounds: Few crackles  Heart sounds:  RRR  Incisions:  Dressings dry, intact  Abdomen:  Soft, non-distended, non-tender  Extremities:  Warm, well-perfused  Chest tubes:  Low volume thin serosanguinous output, + large air leak    Intake/Output from previous day: 01/19 0701 - 01/20 0700 In: 4370 [P.O.:720; I.V.:3350; IV Piggyback:300] Out: 2045 [Urine:1395; Blood:150; Chest Tube:500] Intake/Output this shift: Total I/O In: 360 [I.V.:310; IV Piggyback:50] Out: 270 [Urine:100; Chest Tube:170]  Lab Results:  CBC: Recent Labs  05/11/16 0708 05/11/16 0735 05/12/16 0359  WBC 8.9  --  9.6  HGB 12.8* 11.9* 10.7*  HCT 38.4* 35.0* 32.9*  PLT 354  --  282    BMET:  Recent Labs  05/11/16 0708 05/11/16 0735 05/12/16 0359  NA 135 139 134*  K 4.6 4.1 3.8  CL 103  --  103  CO2 24  --  25  GLUCOSE 113*  --  134*  BUN 5*  --  <5*  CREATININE 0.66  --  0.59*  CALCIUM 9.6  --  8.4*     PT/INR:   Recent Labs  05/11/16 0708  LABPROT 13.5  INR 1.03    CBG (last 3)   Recent Labs  05/12/16 0026 05/12/16 0420 05/12/16 0809  GLUCAP 124* 120* 108*    ABG    Component Value Date/Time   PHART  7.375 05/12/2016 0423   PCO2ART 42.9 05/12/2016 0423   PO2ART 97.0 05/12/2016 0423   HCO3 25.2 05/12/2016 0423   TCO2 27 05/12/2016 0423   ACIDBASEDEF 1.0 05/11/2016 0735   O2SAT 97.0 05/12/2016 0423    CXR: PORTABLE CHEST 1 VIEW  COMPARISON:  May 11, 2016  FINDINGS: Two left-sided chest tubes remain in place. There is a right sided pneumothorax measuring 5.6 cm at the apex today versus 7.5 cm yesterday, mildly improved. No right-sided pneumothorax. The cardiomediastinal silhouette is stable. There is mild increased perihilar opacities bilaterally suggesting mild edema. Mildly more focal opacity in the left base could represent early infiltrate or atelectasis.  IMPRESSION: 1. Stable left chest tubes with persistent left apical pneumothorax, mildly improved in the interval. 2. Suggested mild pulmonary venous congestion/edema. 3. Mildly more focal opacity in left base could represent atelectasis or early infiltrate.   Electronically Signed   By: Dorise Bullion III M.D   On: 05/12/2016 08:11  Assessment/Plan: S/P Procedure(s) (LRB): VIDEO BRONCHOSCOPY, LEFT LUNG (N/A) LEFT VIDEO ASSISTED THORACOSCOPY (VATS) (Left) LYMPH NODE DISSECTION (Left) LEFT LOWER LOBE LOBECTOMY AND LEFT UPPER LOBE  RESECTION AND PLACEMENT OF ON-Q (Left) STAPLING OF APICAL BLEB (Left)  Overall stable POD1 Adequate pain relief using PCA Breathing comfortably w/ O2 sats 93-96% on 4 L/min via [place] Large air leak CXR looks good w/ reasonably good expansion LUL   D/C Aline  Decrease IV fluids  Mobilize  Leave tubes on suction for now  Rexene Alberts, MD 05/12/2016 11:12 AM

## 2016-05-12 NOTE — Plan of Care (Signed)
Problem: Activity: Goal: Ability to tolerate increased activity will improve Outcome: Progressing Pt ambulated in halls today  Problem: Bowel/Gastric: Goal: Gastrointestinal status for postoperative course will improve Outcome: Not Progressing Pt having bloating off and on today, decreased appetite, bowel regimen given

## 2016-05-12 NOTE — Progress Notes (Signed)
Respiratory assessment done and patient scored a 7. changed treatment schedule to TID for now and will reassess as needed

## 2016-05-13 ENCOUNTER — Inpatient Hospital Stay (HOSPITAL_COMMUNITY): Payer: BLUE CROSS/BLUE SHIELD

## 2016-05-13 LAB — CBC
HEMATOCRIT: 32.4 % — AB (ref 39.0–52.0)
Hemoglobin: 10.3 g/dL — ABNORMAL LOW (ref 13.0–17.0)
MCH: 29.2 pg (ref 26.0–34.0)
MCHC: 31.8 g/dL (ref 30.0–36.0)
MCV: 91.8 fL (ref 78.0–100.0)
Platelets: 285 10*3/uL (ref 150–400)
RBC: 3.53 MIL/uL — ABNORMAL LOW (ref 4.22–5.81)
RDW: 13.6 % (ref 11.5–15.5)
WBC: 9 10*3/uL (ref 4.0–10.5)

## 2016-05-13 LAB — GLUCOSE, CAPILLARY
Glucose-Capillary: 105 mg/dL — ABNORMAL HIGH (ref 65–99)
Glucose-Capillary: 78 mg/dL (ref 65–99)
Glucose-Capillary: 95 mg/dL (ref 65–99)
Glucose-Capillary: 96 mg/dL (ref 65–99)
Glucose-Capillary: 98 mg/dL (ref 65–99)

## 2016-05-13 LAB — COMPREHENSIVE METABOLIC PANEL
ALBUMIN: 2.8 g/dL — AB (ref 3.5–5.0)
ALT: 13 U/L — ABNORMAL LOW (ref 17–63)
ANION GAP: 4 — AB (ref 5–15)
AST: 24 U/L (ref 15–41)
Alkaline Phosphatase: 78 U/L (ref 38–126)
BILIRUBIN TOTAL: 0.5 mg/dL (ref 0.3–1.2)
BUN: <5 mg/dL — ABNORMAL LOW (ref 6–20)
CO2: 29 mmol/L (ref 22–32)
Calcium: 8.5 mg/dL — ABNORMAL LOW (ref 8.9–10.3)
Chloride: 103 mmol/L (ref 101–111)
Creatinine, Ser: 0.54 mg/dL — ABNORMAL LOW (ref 0.61–1.24)
GFR calc Af Amer: >60 mL/min (ref >60)
Glucose, Bld: 85 mg/dL (ref 65–99)
POTASSIUM: 3.6 mmol/L (ref 3.5–5.1)
Sodium: 136 mmol/L (ref 135–145)
TOTAL PROTEIN: 5.7 g/dL — AB (ref 6.5–8.1)

## 2016-05-13 MED ORDER — LEVALBUTEROL HCL 0.63 MG/3ML IN NEBU
0.6300 mg | INHALATION_SOLUTION | Freq: Two times a day (BID) | RESPIRATORY_TRACT | Status: DC
  Administered 2016-05-14 – 2016-05-24 (×20): 0.63 mg via RESPIRATORY_TRACT
  Filled 2016-05-13 (×19): qty 3

## 2016-05-13 NOTE — Progress Notes (Signed)
[address]       [place] [address]             [phone]        CARDIOTHORACIC SURGERY PROGRESS NOTE   R2 Days Post-Op Procedure(s) (LRB): VIDEO BRONCHOSCOPY, LEFT LUNG (N/A) LEFT VIDEO ASSISTED THORACOSCOPY (VATS) (Left) LYMPH NODE DISSECTION (Left) LEFT LOWER LOBE LOBECTOMY AND LEFT UPPER LOBE  RESECTION AND PLACEMENT OF ON-Q (Left) STAPLING OF APICAL BLEB (Left)  Subjective: Some "gas pains" and abdominal bloating.  Decreased pain in chest.  No SOB  Objective: Vital signs: BP Readings from Last 1 Encounters:  05/13/16 (!) 150/79   Pulse Readings from Last 1 Encounters:  05/13/16 68   Resp Readings from Last 1 Encounters:  05/13/16 17   Temp Readings from Last 1 Encounters:  05/13/16 97.6 F (36.4 C) (Oral)    Hemodynamics:    Physical Exam:  Rhythm:   sinus  Breath sounds: clear  Heart sounds:  RRR  Incisions:  Dressings dry, intact  Abdomen:  Soft, non-distended, non-tender  Extremities:  Warm, well-perfused  Chest tubes:  trivial volume thin serosanguinous output, + large air leak    Intake/Output from previous day: 01/20 0701 - 01/21 0700 In: 1917 [P.O.:1250; I.V.:617; IV Piggyback:50] Out: 2490 [Urine:1980; Chest Tube:510] Intake/Output this shift: Total I/O In: 10 [I.V.:10] Out: 175 [Urine:125; Chest Tube:50]  Lab Results:  CBC: Recent Labs  05/12/16 0359 05/13/16 0500  WBC 9.6 9.0  HGB 10.7* 10.3*  HCT 32.9* 32.4*  PLT 282 285    BMET:  Recent Labs  05/12/16 0359 05/13/16 0500  NA 134* 136  K 3.8 3.6  CL 103 103  CO2 25 29  GLUCOSE 134* 85  BUN <5* <5*  CREATININE 0.59* 0.54*  CALCIUM 8.4* 8.5*     PT/INR:   Recent Labs  05/11/16 0708  LABPROT 13.5  INR 1.03    CBG (last 3)   Recent Labs  05/12/16 2023 05/12/16 2348 05/13/16 0803  GLUCAP 118* 124* 96    ABG    Component Value Date/Time   PHART 7.375 05/12/2016 0423   PCO2ART 42.9 05/12/2016 0423   PO2ART 97.0 05/12/2016  0423   HCO3 25.2 05/12/2016 0423   TCO2 27 05/12/2016 0423   ACIDBASEDEF 1.0 05/11/2016 0735   O2SAT 97.0 05/12/2016 0423    CXR: PORTABLE CHEST 1 VIEW  COMPARISON:  May 12, 2016  FINDINGS: The right IJ and 2 left-sided chest tubes are in stable position. The left-sided pneumothorax measuring 5 cm at the apex is not significantly changed. No right-sided pneumothorax. The cardiomediastinal silhouette is stable. The right lung is clear. Left perihilar and left basilar infiltrate/ opacities remain. Subcutaneous air is seen in the left chest wall, new in the interval.  IMPRESSION: 1. The 2 left chest tubes remain in place. The left-sided pneumothorax is stable. 2. Air in the left chest wall subcutaneous tissues is new in the interval. Recommend clinical correlation. 3. Persistent left perihilar and left basilar opacities/infiltrates. The left basilar infiltrate is stable while the left perihilar infiltrate is more prominent.   Electronically Signed   By: Dorise Bullion III M.D  Assessment/Plan: S/P Procedure(s) (LRB): VIDEO BRONCHOSCOPY, LEFT LUNG (N/A) LEFT VIDEO ASSISTED THORACOSCOPY (VATS) (Left) LYMPH NODE DISSECTION (Left) LEFT LOWER LOBE LOBECTOMY AND LEFT UPPER LOBE  RESECTION AND PLACEMENT OF ON-Q (Left) STAPLING OF APICAL BLEB (Left)  Overall stable POD2 Adequate analgesia and breathing comfortably Large air leak persists Mild  abdominal discomfort   Will try placing chest tubes to water seal  Watch abdominal exam  Mobilize  Rexene Alberts, MD 05/13/2016 10:43 AM

## 2016-05-13 NOTE — Progress Notes (Signed)
Patient placed to water seal by MD. Patient shortly after began c/o increased pain in left chest, sats dropped to 86%, dressing removed and skin palpated. Subcutaneous air palpated surrounding posterior chest tube extending to axilla and to the mid-back region, outlined in marker. Patient placed back on suction- sats increased and pain decreased. Will continue to monitor.

## 2016-05-14 ENCOUNTER — Inpatient Hospital Stay (HOSPITAL_COMMUNITY): Payer: BLUE CROSS/BLUE SHIELD

## 2016-05-14 ENCOUNTER — Encounter (HOSPITAL_COMMUNITY): Payer: Self-pay | Admitting: Cardiothoracic Surgery

## 2016-05-14 LAB — POCT I-STAT 7, (LYTES, BLD GAS, ICA,H+H)
Acid-base deficit: 1 mmol/L (ref 0.0–2.0)
Bicarbonate: 28.8 mmol/L — ABNORMAL HIGH (ref 20.0–28.0)
Bicarbonate: 27 mmol/L (ref 20.0–28.0)
Calcium, Ion: 1.28 mmol/L (ref 1.15–1.40)
Calcium, Ion: 1.25 mmol/L (ref 1.15–1.40)
HCT: 33 % — ABNORMAL LOW (ref 39.0–52.0)
HCT: 33 % — ABNORMAL LOW (ref 39.0–52.0)
Hemoglobin: 11.2 g/dL — ABNORMAL LOW (ref 13.0–17.0)
Hemoglobin: 11.2 g/dL — ABNORMAL LOW (ref 13.0–17.0)
O2 Saturation: 99 %
O2 Saturation: 99 %
Patient temperature: 35.9
Patient temperature: 36.6
Potassium: 4.8 mmol/L (ref 3.5–5.1)
Potassium: 4.7 mmol/L (ref 3.5–5.1)
Sodium: 136 mmol/L (ref 135–145)
Sodium: 136 mmol/L (ref 135–145)
TCO2: 31 mmol/L (ref 0–100)
TCO2: 29 mmol/L (ref 0–100)
pCO2 arterial: 65.3 mmHg (ref 32.0–48.0)
pCO2 arterial: 61.4 mmHg — ABNORMAL HIGH (ref 32.0–48.0)
pH, Arterial: 7.247 — ABNORMAL LOW (ref 7.350–7.450)
pH, Arterial: 7.249 — ABNORMAL LOW (ref 7.350–7.450)
pO2, Arterial: 156 mmHg — ABNORMAL HIGH (ref 83.0–108.0)
pO2, Arterial: 166 mmHg — ABNORMAL HIGH (ref 83.0–108.0)

## 2016-05-14 LAB — GLUCOSE, CAPILLARY
Glucose-Capillary: 109 mg/dL — ABNORMAL HIGH (ref 65–99)
Glucose-Capillary: 87 mg/dL (ref 65–99)
Glucose-Capillary: 108 mg/dL — ABNORMAL HIGH (ref 65–99)
Glucose-Capillary: 124 mg/dL — ABNORMAL HIGH (ref 65–99)
Glucose-Capillary: 108 mg/dL — ABNORMAL HIGH (ref 65–99)
Glucose-Capillary: 110 mg/dL — ABNORMAL HIGH (ref 65–99)
Glucose-Capillary: 114 mg/dL — ABNORMAL HIGH (ref 65–99)

## 2016-05-14 MED ORDER — INSULIN ASPART 100 UNIT/ML ~~LOC~~ SOLN
0.0000 [IU] | Freq: Three times a day (TID) | SUBCUTANEOUS | Status: DC
  Administered 2016-05-15 (×3): 2 [IU] via SUBCUTANEOUS

## 2016-05-14 MED ORDER — ENOXAPARIN SODIUM 40 MG/0.4ML ~~LOC~~ SOLN
40.0000 mg | SUBCUTANEOUS | Status: DC
  Administered 2016-05-14 – 2016-05-17 (×4): 40 mg via SUBCUTANEOUS
  Filled 2016-05-14 (×4): qty 0.4

## 2016-05-14 MED ORDER — INSULIN ASPART 100 UNIT/ML ~~LOC~~ SOLN: 0.0000 [IU] | SUBCUTANEOUS | Status: DC

## 2016-05-14 NOTE — Progress Notes (Signed)
Fentanyl 40m of PCA wasted and flushed down sink. Witnessed by PCurrie Paris RN.

## 2016-05-14 NOTE — Progress Notes (Signed)
CT surgery p.m. Rounds  Patient sitting up in chair states pain is improved 5/10 Airleak with deep inspiration O2 saturations normal continue current care

## 2016-05-14 NOTE — Progress Notes (Signed)
On Q pump discontinued per protocol.

## 2016-05-14 NOTE — Progress Notes (Signed)
Patient ID: Jennefer Bravo., male   DOB: Oct 27, 1955, 61 y.o.   MRN: [id number] TCTS DAILY ICU PROGRESS NOTE                   [address]            [place] [address]          [phone]   3 Days Post-Op Procedure(s) (LRB): VIDEO BRONCHOSCOPY, LEFT LUNG (N/A) LEFT VIDEO ASSISTED THORACOSCOPY (VATS) (Left) LYMPH NODE DISSECTION (Left) LEFT LOWER LOBE LOBECTOMY AND LEFT UPPER LOBE  RESECTION AND PLACEMENT OF ON-Q (Left) STAPLING OF APICAL BLEB (Left)  Total Length of Stay:  LOS: 3 days   Subjective: Awake and alert, neuro intact, does not like the PCA, says made hi "sick". Bowels moved yesterday and noted he feels better  Objective: Vital signs in last 24 hours: Temp:  [97.6 F (36.4 C)-98.6 F (37 C)] 98.6 F (37 C) (01/22 0351) Pulse Rate:  [57-95] 67 (01/22 0700) Cardiac Rhythm: Sinus bradycardia;Normal sinus rhythm (01/21 2000) Resp:  [7-23] 18 (01/22 0700) BP: (122-159)/(68-90) 150/80 (01/22 0700) SpO2:  [90 %-99 %] 93 % (01/22 0700)  Filed Weights   05/11/16 0557 05/12/16 0600 05/13/16 0600  Weight: 148 lb (67.1 kg) 150 lb 5.7 oz (68.2 kg) 147 lb 11.3 oz (67 kg)    Weight change:    Hemodynamic parameters for last 24 hours:    Intake/Output from previous day: 01/21 0701 - 01/22 0700 In: 985 [P.O.:480; I.V.:240; IV Piggyback:265] Out: 1655 [Urine:1375; Chest Tube:280]  Intake/Output this shift: No intake/output data recorded.  Current Meds: Scheduled Meds: . acetaminophen  1,000 mg Oral Q6H   Or  . acetaminophen (TYLENOL) oral liquid 160 mg/5 mL  1,000 mg Oral Q6H  . bisacodyl  10 mg Oral Daily  . Chlorhexidine Gluconate Cloth  6 each Topical Daily  . fentaNYL   Intravenous Q4H  . insulin aspart  0-24 Units Subcutaneous Q4H  . levalbuterol  0.63 mg Nebulization BID  . mouth rinse  15 mL Mouth Rinse BID  . mupirocin ointment  1 application Nasal BID  . senna-docusate  1 tablet Oral QHS  . sodium chloride flush  10-40 mL  Intracatheter Q12H   Continuous Infusions: . dextrose 5 % and 0.45% NaCl 10 mL/hr at 05/13/16 1900   PRN Meds:.diphenhydrAMINE **OR** diphenhydrAMINE, naloxone **AND** sodium chloride flush, ondansetron (ZOFRAN) IV, oxyCODONE, potassium chloride (KCL MULTIRUN) 30 mEq in 265 mL IVPB, sodium chloride flush  General appearance: alert, cooperative and no distress Neurologic: intact Heart: regular rate and rhythm, S1, S2 normal, no murmur, click, rub or gallop Lungs: diminished breath sounds LUL Abdomen: soft, non-tender; bowel sounds normal; no masses,  no organomegaly Extremities: extremities normal, atraumatic, no cyanosis or edema and Homans sign is negative, no sign of DVT Wound: air leak from anterior chest tubge only,  Lab Results: CBC: Recent Labs  05/12/16 0359 05/13/16 0500  WBC 9.6 9.0  HGB 10.7* 10.3*  HCT 32.9* 32.4*  PLT 282 285   BMET:  Recent Labs  05/12/16 0359 05/13/16 0500  NA 134* 136  K 3.8 3.6  CL 103 103  CO2 25 29  GLUCOSE 134* 85  BUN <5* <5*  CREATININE 0.59* 0.54*  CALCIUM 8.4* 8.5*    CMET: Lab Results  Component Value Date   WBC 9.0 05/13/2016   HGB 10.3 (L) 05/13/2016   HCT 32.4 (L) 05/13/2016   PLT 285 05/13/2016   GLUCOSE 85  05/13/2016   ALT 13 (L) 05/13/2016   AST 24 05/13/2016   NA 136 05/13/2016   K 3.6 05/13/2016   CL 103 05/13/2016   CREATININE 0.54 (L) 05/13/2016   BUN <5 (L) 05/13/2016   CO2 29 05/13/2016   INR 1.03 05/11/2016      PT/INR: No results for input(s): LABPROT, INR in the last 72 hours. Radiology: No results found.   Assessment/Plan: S/P Procedure(s) (LRB): VIDEO BRONCHOSCOPY, LEFT LUNG (N/A) LEFT VIDEO ASSISTED THORACOSCOPY (VATS) (Left) LYMPH NODE DISSECTION (Left) LEFT LOWER LOBE LOBECTOMY AND LEFT UPPER LOBE  RESECTION AND PLACEMENT OF ON-Q (Left) STAPLING OF APICAL BLEB (Left) Mobilize D/c on q Leave chest tubes for now with air leak  D/c pca Expected Acute  Blood - loss Anemia    [name] 05/14/2016 7:15 AM

## 2016-05-15 ENCOUNTER — Inpatient Hospital Stay (HOSPITAL_COMMUNITY): Payer: BLUE CROSS/BLUE SHIELD

## 2016-05-15 LAB — GLUCOSE, CAPILLARY
Glucose-Capillary: 123 mg/dL — ABNORMAL HIGH (ref 65–99)
Glucose-Capillary: 129 mg/dL — ABNORMAL HIGH (ref 65–99)
Glucose-Capillary: 115 mg/dL — ABNORMAL HIGH (ref 65–99)
Glucose-Capillary: 121 mg/dL — ABNORMAL HIGH (ref 65–99)

## 2016-05-15 LAB — CBC
HCT: 33.5 % — ABNORMAL LOW (ref 39.0–52.0)
Hemoglobin: 11.1 g/dL — ABNORMAL LOW (ref 13.0–17.0)
MCH: 29.8 pg (ref 26.0–34.0)
MCHC: 33.1 g/dL (ref 30.0–36.0)
MCV: 90.1 fL (ref 78.0–100.0)
Platelets: 331 10*3/uL (ref 150–400)
RBC: 3.72 MIL/uL — ABNORMAL LOW (ref 4.22–5.81)
RDW: 13.7 % (ref 11.5–15.5)
WBC: 11.4 10*3/uL — ABNORMAL HIGH (ref 4.0–10.5)

## 2016-05-15 LAB — BASIC METABOLIC PANEL
Anion gap: 6 (ref 5–15)
BUN: 5 mg/dL — ABNORMAL LOW (ref 6–20)
CO2: 28 mmol/L (ref 22–32)
Calcium: 8.9 mg/dL (ref 8.9–10.3)
Chloride: 99 mmol/L — ABNORMAL LOW (ref 101–111)
Creatinine, Ser: 0.64 mg/dL (ref 0.61–1.24)
GFR calc Af Amer: >60 mL/min (ref >60)
GFR calc non Af Amer: >60 mL/min (ref >60)
Glucose, Bld: 98 mg/dL (ref 65–99)
Potassium: 3.6 mmol/L (ref 3.5–5.1)
Sodium: 133 mmol/L — ABNORMAL LOW (ref 135–145)

## 2016-05-15 MED ORDER — SODIUM CHLORIDE 0.9 % IV SOLN
30.0000 meq | Freq: Once | INTRAVENOUS | Status: AC
  Administered 2016-05-15: 30 meq via INTRAVENOUS
  Filled 2016-05-15: qty 15

## 2016-05-15 NOTE — Addendum Note (Signed)
Addendum  created 05/15/16 0943 by Duane Boston, MD   Anesthesia Staff edited

## 2016-05-15 NOTE — Care Management Note (Signed)
Case Management Note  Patient Details  Name: William POUPARD [name] MRN: [id number] Date of Birth: 1955-07-30  Subjective/Objective:   Pt lives with wife who plans to take FMLA when he is discharged.                           Expected Discharge Plan:  [place]  Discharge planning Services  CM Consult  Status of Service:  In process, will continue to follow  Girard Cooter, RN 05/15/2016, 2:46 PM

## 2016-05-15 NOTE — Progress Notes (Signed)
Patient ID: [name]., male   DOB: 28-May-1955, 61 y.o.   MRN: [id number] TCTS DAILY ICU PROGRESS NOTE                   [address]            [place] [address]          [phone]   4 Days Post-Op Procedure(s) (LRB): VIDEO BRONCHOSCOPY, LEFT LUNG (N/A) LEFT VIDEO ASSISTED THORACOSCOPY (VATS) (Left) LYMPH NODE DISSECTION (Left) LEFT LOWER LOBE LOBECTOMY AND LEFT UPPER LOBE  RESECTION AND PLACEMENT OF ON-Q (Left) STAPLING OF APICAL BLEB (Left)  Total Length of Stay:  LOS: 4 days   Subjective: Up to chair, holding sinus   Objective: Vital signs in last 24 hours: Temp:  [97.4 F (36.3 C)-98.3 F (36.8 C)] 98.1 F (36.7 C) (01/23 0834) Pulse Rate:  [58-105] 88 (01/23 0800) Cardiac Rhythm: Normal sinus rhythm (01/23 0800) Resp:  [12-26] 25 (01/23 0800) BP: (107-146)/(57-89) 138/78 (01/23 0800) SpO2:  [89 %-99 %] 99 % (01/23 0800)  Filed Weights   05/11/16 0557 05/12/16 0600 05/13/16 0600  Weight: 148 lb (67.1 kg) 150 lb 5.7 oz (68.2 kg) 147 lb 11.3 oz (67 kg)    Weight change:    Hemodynamic parameters for last 24 hours:    Intake/Output from previous day: 01/22 0701 - 01/23 0700 In: 985 [P.O.:480; I.V.:240; IV Piggyback:265] Out: 1760 [Urine:1550; Chest Tube:210]  Intake/Output this shift: Total I/O In: 10 [I.V.:10] Out: -   Current Meds: Scheduled Meds: . acetaminophen  1,000 mg Oral Q6H   Or  . acetaminophen (TYLENOL) oral liquid 160 mg/5 mL  1,000 mg Oral Q6H  . bisacodyl  10 mg Oral Daily  . Chlorhexidine Gluconate Cloth  6 each Topical Daily  . enoxaparin (LOVENOX) injection  40 mg Subcutaneous Q24H  . insulin aspart  0-24 Units Subcutaneous TID AC & HS  . levalbuterol  0.63 mg Nebulization BID  . mouth rinse  15 mL Mouth Rinse BID  . mupirocin ointment  1 application Nasal BID  . senna-docusate  1 tablet Oral QHS  . sodium chloride flush  10-40 mL Intracatheter Q12H   Continuous Infusions: . dextrose 5 % and 0.45% NaCl  10 mL/hr at 05/15/16 0800   PRN Meds:.oxyCODONE, potassium chloride (KCL MULTIRUN) 30 mEq in 265 mL IVPB, sodium chloride flush  General appearance: alert, cooperative and appears older than stated age Neurologic: intact Heart: regular rate and rhythm, S1, S2 normal, no murmur, click, rub or gallop Lungs: diminished breath sounds bibasilar Abdomen: soft, non-tender; bowel sounds normal; no masses,  no organomegaly Extremities: extremities normal, atraumatic, no cyanosis or edema and Homans sign is negative, no sign of DVT Wound: ct on -10 cm, with air leak   Lab Results: CBC:  Recent Labs  05/13/16 0500 05/15/16 0230  WBC 9.0 11.4*  HGB 10.3* 11.1*  HCT 32.4* 33.5*  PLT 285 331   BMET:   Recent Labs  05/13/16 0500 05/15/16 0230  NA 136 133*  K 3.6 3.6  CL 103 99*  CO2 29 28  GLUCOSE 85 98  BUN <5* 5*  CREATININE 0.54* 0.64  CALCIUM 8.5* 8.9    CMET: Lab Results  Component Value Date   WBC 11.4 (H) 05/15/2016   HGB 11.1 (L) 05/15/2016   HCT 33.5 (L) 05/15/2016   PLT 331 05/15/2016   GLUCOSE 98 05/15/2016   ALT 13 (L) 05/13/2016   AST 24  05/13/2016   NA 133 (L) 05/15/2016   K 3.6 05/15/2016   CL 99 (L) 05/15/2016   CREATININE 0.64 05/15/2016   BUN 5 (L) 05/15/2016   CO2 28 05/15/2016   INR 1.03 05/11/2016      PT/INR: No results for input(s): LABPROT, INR in the last 72 hours. Radiology: Dg Chest Port 1 View  Result Date: 05/15/2016 CLINICAL DATA:  Chest tube present. EXAM: PORTABLE CHEST 1 VIEW COMPARISON:  Radiograph of May 14, 2016. FINDINGS: Stable cardiomediastinal silhouette. Right internal jugular catheter is unchanged with distal tip in expected position of the SVC. Mild right basilar subsegmental atelectasis or scarring is noted. Two left-sided chest tubes are unchanged in position. Moderate left apical pneumothorax is noted which is not significantly changed compared to prior exam. Stable subcutaneous emphysema is seen over left lateral chest  wall. IMPRESSION: Stable position of 2 left-sided chest tubes. Stable moderate size left apical pneumothorax is noted. Stable subcutaneous emphysema seen over left lateral chest wall. Mild right basilar subsegmental atelectasis is noted. Electronically Signed   By: Marijo Conception, M.D.   On: 05/15/2016 07:34     Assessment/Plan: S/P Procedure(s) (LRB): VIDEO BRONCHOSCOPY, LEFT LUNG (N/A) LEFT VIDEO ASSISTED THORACOSCOPY (VATS) (Left) LYMPH NODE DISSECTION (Left) LEFT LOWER LOBE LOBECTOMY AND LEFT UPPER LOBE  RESECTION AND PLACEMENT OF ON-Q (Left) STAPLING OF APICAL BLEB (Left) Leave chest tube on suction still with apical space , change plurovac today      [name] 05/15/2016 9:43 AM

## 2016-05-16 ENCOUNTER — Inpatient Hospital Stay (HOSPITAL_COMMUNITY): Payer: BLUE CROSS/BLUE SHIELD

## 2016-05-16 LAB — GLUCOSE, CAPILLARY
Glucose-Capillary: 100 mg/dL — ABNORMAL HIGH (ref 65–99)
Glucose-Capillary: 103 mg/dL — ABNORMAL HIGH (ref 65–99)
Glucose-Capillary: 121 mg/dL — ABNORMAL HIGH (ref 65–99)
Glucose-Capillary: 104 mg/dL — ABNORMAL HIGH (ref 65–99)

## 2016-05-16 NOTE — Progress Notes (Signed)
      [address]       [place] [address]             [phone]      POD # 5 left lower lobectomy  BP 117/67   Pulse 91   Temp 98.3 F (36.8 C) (Oral)   Resp 18   Ht '6\' 3"'$  (1.905 m)   Wt 141 lb 15.6 oz (64.4 kg)   SpO2 90%   BMI 17.75 kg/m    Intake/Output Summary (Last 24 hours) at 05/16/16 1657 Last data filed at 05/16/16 1000  Gross per 24 hour  Intake                0 ml  Output             1355 ml  Net            -1355 ml   Still has air leak  Remo Lipps C. Roxan Hockey, MD [facility] [phone]

## 2016-05-16 NOTE — Progress Notes (Addendum)
      [address]       [place] [address]             [phone]      5 Days Post-Op Procedure(s) (LRB): VIDEO BRONCHOSCOPY, LEFT LUNG (N/A) LEFT VIDEO ASSISTED THORACOSCOPY (VATS) (Left) LYMPH NODE DISSECTION (Left) LEFT LOWER LOBE LOBECTOMY AND LEFT UPPER LOBE  RESECTION AND PLACEMENT OF ON-Q (Left) STAPLING OF APICAL BLEB (Left) Subjective: No issues. Eating breakfast.   Objective: Vital signs in last 24 hours: Temp:  [98 F (36.7 C)-99.8 F (37.7 C)] 98.4 F (36.9 C) (01/24 0700) Pulse Rate:  [66-100] 70 (01/24 0700) Cardiac Rhythm: Normal sinus rhythm (01/24 0430) Resp:  [9-34] 15 (01/24 0700) BP: (108-142)/(59-94) 113/64 (01/24 0700) SpO2:  [90 %-98 %] 97 % (01/24 0807) Weight:  [141 lb 15.6 oz (64.4 kg)] 141 lb 15.6 oz (64.4 kg) (01/24 0400)     Intake/Output from previous day: 01/23 0701 - 01/24 0700 In: 510 [P.O.:480; I.V.:30] Out: 1630 [Urine:1350; Chest Tube:280] Intake/Output this shift: No intake/output data recorded.  General appearance: alert, cooperative and no distress Heart: regular rate and rhythm, S1, S2 normal, no murmur, click, rub or gallop Lungs: clear to auscultation bilaterally Abdomen: soft, non-tender; bowel sounds normal; no masses,  no organomegaly Extremities: extremities normal, atraumatic, no cyanosis or edema Wound: clean and dry  Lab Results:  Recent Labs  05/15/16 0230  WBC 11.4*  HGB 11.1*  HCT 33.5*  PLT 331   BMET:  Recent Labs  05/15/16 0230  NA 133*  K 3.6  CL 99*  CO2 28  GLUCOSE 98  BUN 5*  CREATININE 0.64  CALCIUM 8.9    PT/INR: No results for input(s): LABPROT, INR in the last 72 hours. ABG    Component Value Date/Time   PHART 7.375 05/12/2016 0423   HCO3 25.2 05/12/2016 0423   TCO2 27 05/12/2016 0423   ACIDBASEDEF 1.0 05/11/2016 1444   O2SAT 97.0 05/12/2016 0423   CBG (last 3)   Recent Labs  05/15/16 1651 05/15/16 2145 05/16/16 0838  GLUCAP 115* 121* 100*     Assessment/Plan: S/P Procedure(s) (LRB): VIDEO BRONCHOSCOPY, LEFT LUNG (N/A) LEFT VIDEO ASSISTED THORACOSCOPY (VATS) (Left) LYMPH NODE DISSECTION (Left) LEFT LOWER LOBE LOBECTOMY AND LEFT UPPER LOBE  RESECTION AND PLACEMENT OF ON-Q (Left) STAPLING OF APICAL BLEB (Left)  1. Pulm- left apical pneumothorax remains on chest xray. Chest tube changed to waterseal. Remains on 3L [place]. Chest tube with 280cc/24 hours 2. CV-BP well controlled. NSR in the 70s 3. Renal- creatinine stable. Good urine output. Weight trending down.  4. H and H stable 5. Good blood glucose control  Plan: Continue chest tube on waterseal. Labs and CXR in the am.    LOS: 5 days    Elgie Collard 05/16/2016  Continued airleak, will put on water seal and see if lung will stay up. 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 and agree with the above assessment  and plan.  [name] MD Bee[facility] [phone] Office [phone] 05/16/2016 3:13 PM

## 2016-05-16 NOTE — Plan of Care (Signed)
Problem: Activity: Goal: Risk for activity intolerance will decrease Outcome: Progressing Pt up OOBx3  Problem: Cardiac: Goal: Hemodynamic stability will improve Outcome: Progressing NSR on 0 gtt's  Problem: Physical Regulation: Goal: Postoperative complications will be avoided or minimized Outcome: Progressing Still with air leak. Minimal sub Q air  Problem: Respiratory: Goal: Pain level will decrease with appropriate interventions Outcome: Progressing Minimal c/o pain Goal: Respiratory status will improve Outcome: Progressing On 2L [place]

## 2016-05-17 ENCOUNTER — Inpatient Hospital Stay (HOSPITAL_COMMUNITY): Payer: BLUE CROSS/BLUE SHIELD

## 2016-05-17 LAB — CBC
HCT: 33.5 % — ABNORMAL LOW (ref 39.0–52.0)
HEMOGLOBIN: 11.4 g/dL — AB (ref 13.0–17.0)
MCH: 30.4 pg (ref 26.0–34.0)
MCHC: 34 g/dL (ref 30.0–36.0)
MCV: 89.3 fL (ref 78.0–100.0)
Platelets: 361 10*3/uL (ref 150–400)
RBC: 3.75 MIL/uL — AB (ref 4.22–5.81)
RDW: 13.6 % (ref 11.5–15.5)
WBC: 17.6 10*3/uL — AB (ref 4.0–10.5)

## 2016-05-17 LAB — BASIC METABOLIC PANEL
Anion gap: 10 (ref 5–15)
BUN: 9 mg/dL (ref 6–20)
CHLORIDE: 97 mmol/L — AB (ref 101–111)
CO2: 26 mmol/L (ref 22–32)
CREATININE: 0.7 mg/dL (ref 0.61–1.24)
Calcium: 9.1 mg/dL (ref 8.9–10.3)
GFR calc non Af Amer: >60 mL/min (ref >60)
Glucose, Bld: 102 mg/dL — ABNORMAL HIGH (ref 65–99)
POTASSIUM: 3.9 mmol/L (ref 3.5–5.1)
SODIUM: 133 mmol/L — AB (ref 135–145)

## 2016-05-17 LAB — GLUCOSE, CAPILLARY
Glucose-Capillary: 102 mg/dL — ABNORMAL HIGH (ref 65–99)
Glucose-Capillary: 96 mg/dL (ref 65–99)

## 2016-05-17 MED ORDER — DEXTROSE 5 % IV SOLN
1.5000 g | INTRAVENOUS | Status: DC
  Filled 2016-05-17 (×4): qty 1.5

## 2016-05-17 NOTE — Progress Notes (Signed)
Dr. Servando Snare notified of increased amount of air leak in [place].  Constant bubbling.  Order to take suction to -10cm suction.

## 2016-05-17 NOTE — Progress Notes (Signed)
Patient ID: [name]., male   DOB: 1955/07/19, 61 y.o.   MRN: [id number]  SICU Evening Rounds:  Hemodynamically stable.  CT output low but still has large air leak  Plan return to OR tomorrow.

## 2016-05-17 NOTE — Progress Notes (Signed)
Patient ID: [name]., male   DOB: 10/11/1955, 61 y.o.   MRN: [id number] TCTS DAILY ICU PROGRESS NOTE                   [address]            [place] [address]          [phone]   6 Days Post-Op Procedure(s) (LRB): VIDEO BRONCHOSCOPY, LEFT LUNG (N/A) LEFT VIDEO ASSISTED THORACOSCOPY (VATS) (Left) LYMPH NODE DISSECTION (Left) LEFT LOWER LOBE LOBECTOMY AND LEFT UPPER LOBE  RESECTION AND PLACEMENT OF ON-Q (Left) STAPLING OF APICAL BLEB (Left)  Total Length of Stay:  LOS: 6 days   Subjective: Stable this am, but xray this am shows marked increase in pts on left after converting to water seal  Objective: Vital signs in last 24 hours: Temp:  [98.3 F (36.8 C)-99.7 F (37.6 C)] 98.3 F (36.8 C) (01/25 0809) Pulse Rate:  [74-130] 74 (01/25 0700) Cardiac Rhythm: Normal sinus rhythm (01/25 0400) Resp:  [16-25] 23 (01/25 0700) BP: (103-157)/(63-104) 133/74 (01/25 0700) SpO2:  [79 %-100 %] 93 % (01/25 0700) Weight:  [141 lb 8.6 oz (64.2 kg)] 141 lb 8.6 oz (64.2 kg) (01/25 0500)  Filed Weights   05/13/16 0600 05/16/16 0400 05/17/16 0500  Weight: 147 lb 11.3 oz (67 kg) 141 lb 15.6 oz (64.4 kg) 141 lb 8.6 oz (64.2 kg)    Weight change: -7.1 oz (-0.2 kg)   Hemodynamic parameters for last 24 hours:    Intake/Output from previous day: 01/24 0701 - 01/25 0700 In: 960 [P.O.:960] Out: 1695 [Urine:1525; Chest Tube:170]  Intake/Output this shift: No intake/output data recorded.  Current Meds: Scheduled Meds: . bisacodyl  10 mg Oral Daily  . enoxaparin (LOVENOX) injection  40 mg Subcutaneous Q24H  . insulin aspart  0-24 Units Subcutaneous TID AC & HS  . levalbuterol  0.63 mg Nebulization BID  . mouth rinse  15 mL Mouth Rinse BID  . senna-docusate  1 tablet Oral QHS  . sodium chloride flush  10-40 mL Intracatheter Q12H   Continuous Infusions: . dextrose 5 % and 0.45% NaCl Stopped (05/15/16 1200)   PRN Meds:.oxyCODONE, potassium chloride (KCL  MULTIRUN) 30 mEq in 265 mL IVPB, sodium chloride flush  General appearance: alert and cooperative Neurologic: intact Heart: regular rate and rhythm, S1, S2 normal, no murmur, click, rub or gallop Lungs: diminished breath sounds LUL Abdomen: soft, non-tender; bowel sounds normal; no masses,  no organomegaly Extremities: extremities normal, atraumatic, no cyanosis or edema and Homans sign is negative, no sign of DVT Wound: large air leak this am.  Lab Results: CBC: Recent Labs  05/15/16 0230 05/17/16 0500  WBC 11.4* 17.6*  HGB 11.1* 11.4*  HCT 33.5* 33.5*  PLT 331 361   BMET:  Recent Labs  05/15/16 0230 05/17/16 0500  NA 133* 133*  K 3.6 3.9  CL 99* 97*  CO2 28 26  GLUCOSE 98 102*  BUN 5* 9  CREATININE 0.64 0.70  CALCIUM 8.9 9.1    CMET: Lab Results  Component Value Date   WBC 17.6 (H) 05/17/2016   HGB 11.4 (L) 05/17/2016   HCT 33.5 (L) 05/17/2016   PLT 361 05/17/2016   GLUCOSE 102 (H) 05/17/2016   ALT 13 (L) 05/13/2016   AST 24 05/13/2016   NA 133 (L) 05/17/2016   K 3.9 05/17/2016   CL 97 (L) 05/17/2016   CREATININE 0.70 05/17/2016   BUN 9 05/17/2016  CO2 26 05/17/2016   INR 1.03 05/11/2016      PT/INR: No results for input(s): LABPROT, INR in the last 72 hours. Radiology: Dg Chest Port 1 View  Result Date: 05/17/2016 CLINICAL DATA:  Left pneumothorax, chest pain EXAM: PORTABLE CHEST 1 VIEW COMPARISON:  05/16/2016 FINDINGS: Cardiac shadow is stable. Two left thoracostomy catheters are identified. The left pneumothorax has increased in the interval from the prior exam. The right lung remains well aerated. Right jugular central line is seen. No focal infiltrate is noted. IMPRESSION: Significant enlargement of left pneumothorax These results will be called to the ordering clinician or representative by the Radiologist Assistant, and communication documented in the PACS or zVision Dashboard. Electronically Signed   By: Inez Catalina M.D.   On: 05/17/2016 07:52      Assessment/Plan: S/P Procedure(s) (LRB): VIDEO BRONCHOSCOPY, LEFT LUNG (N/A) LEFT VIDEO ASSISTED THORACOSCOPY (VATS) (Left) LYMPH NODE DISSECTION (Left) LEFT LOWER LOBE LOBECTOMY AND LEFT UPPER LOBE  RESECTION AND PLACEMENT OF ON-Q (Left) STAPLING OF APICAL BLEB (Left) Wbc elevated Chest xray  This am noted and placed back on suction , appears increased air leak, repeat chest xray this afternoon, consider bronchoscopy and repeat vats . Patient had apical blebs, that may be leaking, at surgery apical blebs were stapled .   [name] 05/17/2016 8:28 AM

## 2016-05-17 NOTE — Progress Notes (Addendum)
Patient ID: [name]., male   DOB: Feb 26, 1956, 61 y.o.   MRN: [id number]      [address]       [place] [address]             [phone]                 6 Days Post-Op Procedure(s) (LRB): VIDEO BRONCHOSCOPY, LEFT LUNG (N/A) LEFT VIDEO ASSISTED THORACOSCOPY (VATS) (Left) LYMPH NODE DISSECTION (Left) LEFT LOWER LOBE LOBECTOMY AND LEFT UPPER LOBE  RESECTION AND PLACEMENT OF ON-Q (Left) STAPLING OF APICAL BLEB (Left)  LOS: 6 days   Subjective: Continued air leak  Objective: Vital signs in last 24 hours: Patient Vitals for the past 24 hrs:  BP Temp Temp src Pulse Resp SpO2 Weight  05/17/16 1700 133/70 - - 72 19 95 % -  05/17/16 1603 - 97.5 F (36.4 C) Oral - - - -  05/17/16 1600 124/71 - - 72 19 95 % -  05/17/16 1500 121/66 - - 81 (!) 24 96 % -  05/17/16 1400 116/65 - - 92 (!) 23 96 % -  05/17/16 1300 119/63 - - 72 (!) 23 97 % -  05/17/16 1215 - 98.4 F (36.9 C) Oral - - - -  05/17/16 1200 116/60 - - 69 18 97 % -  05/17/16 1100 111/71 - - 76 20 97 % -  05/17/16 1000 105/68 - - 100 (!) 24 95 % -  05/17/16 0900 123/76 - - 88 (!) 21 95 % -  05/17/16 0841 - - - 91 20 96 % -  05/17/16 0809 - 98.3 F (36.8 C) Oral - - - -  05/17/16 0800 121/76 - - 98 (!) 22 92 % -  05/17/16 0700 133/74 - - 74 (!) 23 93 % -  05/17/16 0600 131/75 - - 89 16 94 % -  05/17/16 0500 (!) 157/85 - - 90 (!) 21 96 % 141 lb 8.6 oz (64.2 kg)  05/17/16 0400 133/73 99.1 F (37.3 C) Oral 84 19 91 % -  05/17/16 0300 132/74 - - 98 (!) 25 92 % -  05/17/16 0200 133/76 - - 90 (!) 22 93 % -  05/17/16 0100 134/68 - - 82 16 96 % -  05/17/16 0001 - 99.4 F (37.4 C) Oral - - - -  05/17/16 0000 138/70 - - 89 (!) 21 100 % -  05/16/16 2300 137/67 - - 82 (!) 22 100 % -  05/16/16 2200 121/77 - - 90 (!) 23 95 % -  05/16/16 2100 (!) 124/104 - - 100 (!) 22 92 % -  05/16/16 2034 - - - - - 95 % -  05/16/16 2000 137/76 - - 93 (!) 24 94 % -  05/16/16 1942 - 99.7 F (37.6 C) Oral - - - -  05/16/16  1900 124/77 - - 94 (!) 23 94 % -  05/16/16 1800 126/82 - - 98 (!) 25 97 % -    Filed Weights   05/13/16 0600 05/16/16 0400 05/17/16 0500  Weight: 147 lb 11.3 oz (67 kg) 141 lb 15.6 oz (64.4 kg) 141 lb 8.6 oz (64.2 kg)    Hemodynamic parameters for last 24 hours:    Intake/Output from previous day: 01/24 0701 - 01/25 0700 In: 960 [P.O.:960] Out: 1695 [Urine:1525; Chest Tube:170] Intake/Output this shift: Total I/O In: 240 [P.O.:240] Out: 520 [Urine:360; Chest Tube:160]  Scheduled Meds: . bisacodyl  10 mg  Oral Daily  . enoxaparin (LOVENOX) injection  40 mg Subcutaneous Q24H  . insulin aspart  0-24 Units Subcutaneous TID AC & HS  . levalbuterol  0.63 mg Nebulization BID  . mouth rinse  15 mL Mouth Rinse BID  . senna-docusate  1 tablet Oral QHS  . sodium chloride flush  10-40 mL Intracatheter Q12H   Continuous Infusions: . dextrose 5 % and 0.45% NaCl Stopped (05/15/16 1200)   PRN Meds:.oxyCODONE, potassium chloride (KCL MULTIRUN) 30 mEq in 265 mL IVPB, sodium chloride flush    Lab Results: CBC: Recent Labs  05/15/16 0230 05/17/16 0500  WBC 11.4* 17.6*  HGB 11.1* 11.4*  HCT 33.5* 33.5*  PLT 331 361   BMET:  Recent Labs  05/15/16 0230 05/17/16 0500  NA 133* 133*  K 3.6 3.9  CL 99* 97*  CO2 28 26  GLUCOSE 98 102*  BUN 5* 9  CREATININE 0.64 0.70  CALCIUM 8.9 9.1    PT/INR: No results for input(s): LABPROT, INR in the last 72 hours.   Radiology Dg Chest Port 1 View  Result Date: 05/17/2016 CLINICAL DATA:  Placement of chest tube for pneumothorax after left lower lobectomy EXAM: PORTABLE CHEST 1 VIEW COMPARISON:  Chest x-ray of 05/17/2017 FINDINGS: There has been a decrease in volume of the left pneumothorax with an apical component remaining, with left chest tubes present. The right lung is clear. Right IJ central venous line tip overlies the mid upper SVC. Cardiomegaly is stable. Left chest wall subcutaneous air again is noted. IMPRESSION: 1. Decrease in  size of left pneumothorax with left chest tubes present. A left apical component of the pneumothorax persists. 2. Stable position of right IJ central venous line. Electronically Signed   By: Ivar Drape M.D.   On: 05/17/2016 16:17   Dg Chest Port 1 View  Result Date: 05/17/2016 CLINICAL DATA:  Left pneumothorax, chest pain EXAM: PORTABLE CHEST 1 VIEW COMPARISON:  05/16/2016 FINDINGS: Cardiac shadow is stable. Two left thoracostomy catheters are identified. The left pneumothorax has increased in the interval from the prior exam. The right lung remains well aerated. Right jugular central line is seen. No focal infiltrate is noted. IMPRESSION: Significant enlargement of left pneumothorax These results will be called to the ordering clinician or representative by the Radiologist Assistant, and communication documented in the PACS or zVision Dashboard. Electronically Signed   By: Inez Catalina M.D.   On: 05/17/2016 07:52   Dg Chest Port 1 View  Result Date: 05/16/2016 CLINICAL DATA:  History of left lower lobe lung carcinoma. Status post left lower lobectomy and left upper lobe resection 05/11/2016. Pneumothorax. Chest tubes in place. EXAM: PORTABLE CHEST 1 VIEW COMPARISON:  Single-view of the chest 05/15/2016 05/14/2016. FINDINGS: Right IJ catheter and 2 left chest tubes remain in place. Left apical pneumothorax is unchanged. Subcutaneous emphysema along the left chest wall is again seen. Mild basilar atelectasis is seen on the right. The lungs are emphysematous. Heart size is upper normal. IMPRESSION: No change in the left apical pneumothorax with 2 chest tubes in place. Emphysema. Electronically Signed   By: Inge Rise M.D.   On: 05/16/2016 08:22     Assessment/Plan: S/P Procedure(s) (LRB): VIDEO BRONCHOSCOPY, LEFT LUNG (N/A) LEFT VIDEO ASSISTED THORACOSCOPY (VATS) (Left) LYMPH NODE DISSECTION (Left) LEFT LOWER LOBE LOBECTOMY AND LEFT UPPER LOBE  RESECTION AND PLACEMENT OF ON-Q (Left) STAPLING  OF APICAL BLEB (Left)  Chest xray improved this pm, but still air leak, large Discussed with patientandwife repeat  left vats tomorrow to attempt identify leak site    [name] MD 05/17/2016 5:55 PM

## 2016-05-17 NOTE — Op Note (Signed)
NAME:  [name], [name] NO.:  [id number]  MEDICAL RECORD NO.:  [id number]  LOCATION:                                 FACILITY:  PHYSICIAN:  Lanelle Bal, MD    DATE OF BIRTH:  19-Nov-1955  DATE OF PROCEDURE:  05/11/2016 DATE OF DISCHARGE:                              OPERATIVE REPORT   PREOPERATIVE DIAGNOSIS:  A 4-cm mass, left lower lobe, biopsy-proven non- small cell carcinoma.  POSTOPERATIVE DIAGNOSIS:  A 4-cm mass, left lower lobe, biopsy-proven non-small cell carcinoma.  SURGICAL PROCEDURE:  Bronchoscopy; left video-assisted thoracoscopy; mini thoracotomy; left lower lobectomy; wedge resection, left upper lobe; lymph node dissection; placement of On-Q.  SURGEON:  Lanelle Bal, MD  FIRST ASSISTANT:  Nicholes Rough, PA.  BRIEF HISTORY:  The patient is a 61 year old male with long smoking history, who presented with a large abdominal aneurysm on evaluation of this for consideration of stenting, a 4-cm mass in the left lower lobe was noted, further evaluation of this including CT of the chest, PET scan, needle biopsy, full pulmonary function testing including CPX testing and cardiac clearance was obtained.  The patient did have known mitral regurgitation.  Initially, the patient was resistant to consideration of surgery.  He had undergone stent grafting of his abdominal aneurysm, which he had tolerated.  After further discussion, he was agreeable with proceeding with surgery.  Risks and options were discussed with him in detail and signed informed consent.  DESCRIPTION OF PROCEDURE:  The patient, with central line and arterial line in place, underwent general endotracheal anesthesia without incident.  A single-lumen endotracheal tube was placed, and through this, after appropriate time-out was performed, fiberoptic bronchoscopy was performed to the subsegmental level in both the left and right tracheobronchial tree without evidence of endobronchial  lesions.  The patient was then turned in the lateral decubitus position.  The left side had preoperatively been marked.  The left chest was prepped with Betadine and draped in sterile manner.  A double-lumen endotracheal tube had been placed by Dr. Linna Caprice and was in good position.  A small port incision was made in the fourth intercostal space.  A 30-degree 5-mm scope was introduced through this port incision.  The tumor was primarily in the superior segment of the lower lobe.  With examination with the scope, it was apparent that it was also crossing or into the upper lobe.  We made two additional port sites.  The initial site was enlarged slightly as a utilitarian incision and we proceeded with dissection.  The inferior pulmonary vein was easily isolated along the fissure of the pulmonary artery branches to the lower lobe identified as were the branches to the lingular segment of the upper lobe.  We had freed up the hilum posteriorly and then with a tedious dissection, worked along the fissure between the superior segment and the upper lobe, this required stapling across the wedge resection of the upper lobe.  Ultimately, this freed the superior segment off the upper lobe and gave access to the remaining pulmonary artery branches to the superior segment.  We then divided the pulmonary artery branch to the superior segment with a vascular  stapler and the branch to the remaining trunk to the lower lobe, the inferior pulmonary vein was divided.  This gave [place] good exposure of the lower lobe bronchus, which was divided, preserving the takeoff to the lingula.  Separately, the bronchus to the superior segment was divided with a green stapler.  The specimen was then removed through a specimen bag.  The stapled margin along the upper to the superior segment, and the upper lobe was marked.  The frozen section of this margin was microscopically positive.  So, additional wedge resection of the left  upper lobe along the old staple line was performed and the margins frozen reported as negative.  A lymph node dissection was then performed including 2L, 4L, 10, 11, 12L and eight lymph nodes sites were sent to Pathology in separate cups and labeled. The bronchial stump was tested for air leak and at this point, there was no air leak.  An On-Q device was tunneled posteriorly and a small catheter secured in place.  As we ventilated the left upper lobe more, it was apparent there was an air leak from the upper lobe and the scope was reintroduced and an area of apical blebs had significant air leak, which mobilized to the apex of the lung further and placed and stapled across an area that was identified as leaking this markedly improved the air leak.  Two 28-eight chest tubes, one Blake and one standard were left in through two of the port sites.  Utilitarian incision was then closed with interrupted 0 Vicryls, 3-0 Vicryl for subcutaneous tissue and a 4-0 subcuticular stitch in skin edges.  Dry dressings were applied.  Sponge and needle counts were reported as correct.  At completion of the procedure, total blood loss was approximately 150 mL. The patient was extubated in the operating room and transferred to the recovery room for further postoperative care.     Lanelle Bal, MD   ______________________________ Lanelle Bal, MD    EG/MEDQ  D:  05/16/2016  T:  05/17/2016  Job:  [id number]

## 2016-05-18 ENCOUNTER — Inpatient Hospital Stay (HOSPITAL_COMMUNITY): Payer: BLUE CROSS/BLUE SHIELD

## 2016-05-18 ENCOUNTER — Inpatient Hospital Stay (HOSPITAL_COMMUNITY): Payer: BLUE CROSS/BLUE SHIELD | Admitting: Certified Registered Nurse Anesthetist

## 2016-05-18 ENCOUNTER — Encounter (HOSPITAL_COMMUNITY)
Admission: RE | Disposition: A | Discharge status: Home Health | Payer: Self-pay | Source: Ambulatory Visit | Attending: Cardiothoracic Surgery

## 2016-05-18 ENCOUNTER — Encounter (HOSPITAL_COMMUNITY): Payer: Self-pay | Admitting: Surgery

## 2016-05-18 DIAGNOSIS — C3490 Malignant neoplasm of unspecified part of unspecified bronchus or lung: Secondary | ICD-10-CM

## 2016-05-18 DIAGNOSIS — J95812 Postprocedural air leak: Secondary | ICD-10-CM

## 2016-05-18 HISTORY — PX: VIDEO ASSISTED THORACOSCOPY: SHX5073

## 2016-05-18 HISTORY — PX: VIDEO BRONCHOSCOPY: SHX5072

## 2016-05-18 HISTORY — DX: Malignant neoplasm of unspecified part of unspecified bronchus or lung: C34.90

## 2016-05-18 LAB — COMPREHENSIVE METABOLIC PANEL
ALT: 17 U/L (ref 17–63)
AST: 20 U/L (ref 15–41)
Albumin: 2.8 g/dL — ABNORMAL LOW (ref 3.5–5.0)
Alkaline Phosphatase: 80 U/L (ref 38–126)
Anion gap: 7 (ref 5–15)
BUN: 10 mg/dL (ref 6–20)
CO2: 27 mmol/L (ref 22–32)
Calcium: 8.9 mg/dL (ref 8.9–10.3)
Chloride: 96 mmol/L — ABNORMAL LOW (ref 101–111)
Creatinine, Ser: 0.7 mg/dL (ref 0.61–1.24)
GFR calc Af Amer: >60 mL/min (ref >60)
GFR calc non Af Amer: >60 mL/min (ref >60)
Glucose, Bld: 108 mg/dL — ABNORMAL HIGH (ref 65–99)
Potassium: 4 mmol/L (ref 3.5–5.1)
Sodium: 130 mmol/L — ABNORMAL LOW (ref 135–145)
Total Bilirubin: 0.6 mg/dL (ref 0.3–1.2)
Total Protein: 6.6 g/dL (ref 6.5–8.1)

## 2016-05-18 LAB — CBC
HCT: 32 % — ABNORMAL LOW (ref 39.0–52.0)
Hemoglobin: 10.8 g/dL — ABNORMAL LOW (ref 13.0–17.0)
MCH: 30.1 pg (ref 26.0–34.0)
MCHC: 33.8 g/dL (ref 30.0–36.0)
MCV: 89.1 fL (ref 78.0–100.0)
Platelets: 326 10*3/uL (ref 150–400)
RBC: 3.59 MIL/uL — ABNORMAL LOW (ref 4.22–5.81)
RDW: 13.4 % (ref 11.5–15.5)
WBC: 11.7 10*3/uL — ABNORMAL HIGH (ref 4.0–10.5)

## 2016-05-18 LAB — URINALYSIS, ROUTINE W REFLEX MICROSCOPIC
Bilirubin Urine: NEGATIVE
Glucose, UA: NEGATIVE mg/dL
Ketones, ur: NEGATIVE mg/dL
Nitrite: POSITIVE — AB
Protein, ur: NEGATIVE mg/dL
RBC / HPF: NONE SEEN RBC/hpf (ref 0–5)
Specific Gravity, Urine: 1.017 (ref 1.005–1.030)
pH: 6 (ref 5.0–8.0)

## 2016-05-18 LAB — TYPE AND SCREEN
ABO/RH(D): AB POS
Antibody Screen: NEGATIVE

## 2016-05-18 LAB — BLOOD GAS, ARTERIAL
Acid-Base Excess: 3.8 mmol/L — ABNORMAL HIGH (ref 0.0–2.0)
Bicarbonate: 27.6 mmol/L (ref 20.0–28.0)
Drawn by: 25203
FIO2: 21
O2 Saturation: 85.7 %
Patient temperature: 98.6
pCO2 arterial: 40.6 mmHg (ref 32.0–48.0)
pH, Arterial: 7.448 (ref 7.350–7.450)
pO2, Arterial: 51.5 mmHg — ABNORMAL LOW (ref 83.0–108.0)

## 2016-05-18 LAB — GLUCOSE, CAPILLARY
Glucose-Capillary: 103 mg/dL — ABNORMAL HIGH (ref 65–99)
Glucose-Capillary: 121 mg/dL — ABNORMAL HIGH (ref 65–99)
Glucose-Capillary: 107 mg/dL — ABNORMAL HIGH (ref 65–99)

## 2016-05-18 LAB — PROTIME-INR
INR: 1.13
Prothrombin Time: 14.6 seconds (ref 11.4–15.2)

## 2016-05-18 LAB — APTT: aPTT: 34 seconds (ref 24–36)

## 2016-05-18 SURGERY — BRONCHOSCOPY, VIDEO-ASSISTED
Anesthesia: General | Site: Chest

## 2016-05-18 MED ORDER — FENTANYL CITRATE (PF) 250 MCG/5ML IJ SOLN
INTRAMUSCULAR | Status: AC
  Filled 2016-05-18: qty 5

## 2016-05-18 MED ORDER — FENTANYL CITRATE (PF) 100 MCG/2ML IJ SOLN
INTRAMUSCULAR | Status: DC | PRN
  Administered 2016-05-18: 100 ug via INTRAVENOUS
  Administered 2016-05-18: 50 ug via INTRAVENOUS
  Administered 2016-05-18: 25 ug via INTRAVENOUS
  Administered 2016-05-18: 100 ug via INTRAVENOUS
  Administered 2016-05-18 (×2): 50 ug via INTRAVENOUS
  Administered 2016-05-18: 75 ug via INTRAVENOUS
  Administered 2016-05-18: 50 ug via INTRAVENOUS
  Administered 2016-05-18: 25 ug via INTRAVENOUS
  Administered 2016-05-18: 100 ug via INTRAVENOUS
  Administered 2016-05-18: 50 ug via INTRAVENOUS
  Administered 2016-05-18: 100 ug via INTRAVENOUS
  Administered 2016-05-18: 25 ug via INTRAVENOUS
  Administered 2016-05-18 (×2): 50 ug via INTRAVENOUS

## 2016-05-18 MED ORDER — 0.9 % SODIUM CHLORIDE (POUR BTL) OPTIME
TOPICAL | Status: DC | PRN
  Administered 2016-05-18: 2000 mL

## 2016-05-18 MED ORDER — ROCURONIUM BROMIDE 100 MG/10ML IV SOLN
INTRAVENOUS | Status: DC | PRN
  Administered 2016-05-18: 20 mg via INTRAVENOUS
  Administered 2016-05-18: 50 mg via INTRAVENOUS
  Administered 2016-05-18: 10 mg via INTRAVENOUS

## 2016-05-18 MED ORDER — ONDANSETRON HCL 4 MG/2ML IJ SOLN
INTRAMUSCULAR | Status: DC | PRN
  Administered 2016-05-18: 4 mg via INTRAVENOUS

## 2016-05-18 MED ORDER — PROPOFOL 10 MG/ML IV BOLUS
INTRAVENOUS | Status: DC | PRN
  Administered 2016-05-18: 50 mg via INTRAVENOUS
  Administered 2016-05-18: 140 mg via INTRAVENOUS

## 2016-05-18 MED ORDER — ONDANSETRON HCL 4 MG/2ML IJ SOLN: 4.0000 mg | Freq: Once | INTRAMUSCULAR | Status: DC | PRN

## 2016-05-18 MED ORDER — DEXTROSE 5 % IV SOLN
1.5000 g | Freq: Two times a day (BID) | INTRAVENOUS | Status: AC
  Administered 2016-05-18 – 2016-05-20 (×4): 1.5 g via INTRAVENOUS
  Filled 2016-05-18 (×4): qty 1.5

## 2016-05-18 MED ORDER — MIDAZOLAM HCL 2 MG/2ML IJ SOLN
INTRAMUSCULAR | Status: AC
  Filled 2016-05-18: qty 2

## 2016-05-18 MED ORDER — LACTATED RINGERS IV SOLN
INTRAVENOUS | Status: DC | PRN
  Administered 2016-05-18 (×2): via INTRAVENOUS

## 2016-05-18 MED ORDER — ENOXAPARIN SODIUM 40 MG/0.4ML ~~LOC~~ SOLN: 40.0000 mg | SUBCUTANEOUS | Status: DC

## 2016-05-18 MED ORDER — FENTANYL CITRATE (PF) 100 MCG/2ML IJ SOLN: 25.0000 ug | INTRAMUSCULAR | Status: DC | PRN

## 2016-05-18 SURGICAL SUPPLY — 90 items
APPLICATOR TIP COSEAL (VASCULAR PRODUCTS) IMPLANT
APPLICATOR TIP EXT COSEAL (VASCULAR PRODUCTS) IMPLANT
APPLIER CLIP ROT 10 11.4 M/L (STAPLE) ×3
BLADE SURG 11 STRL SS (BLADE) IMPLANT
BRUSH CYTOL CELLEBRITY 1.5X140 (MISCELLANEOUS) IMPLANT
CANISTER SUCTION 2500CC (MISCELLANEOUS) ×3 IMPLANT
CATH KIT ON Q 5IN SLV (PAIN MANAGEMENT) IMPLANT
CATH THORACIC 28FR (CATHETERS) IMPLANT
CATH THORACIC 36FR (CATHETERS) IMPLANT
CATH THORACIC 36FR RT ANG (CATHETERS) IMPLANT
CLEANER TIP ELECTROSURG 2X2 (MISCELLANEOUS) ×3 IMPLANT
CLIP APPLIE ROT 10 11.4 M/L (STAPLE) ×2 IMPLANT
CLIP TI MEDIUM 6 (CLIP) IMPLANT
CONT SPEC 4OZ CLIKSEAL STRL BL (MISCELLANEOUS) ×6 IMPLANT
COVER TABLE BACK 60X90 (DRAPES) IMPLANT
CUTTER ECHEON FLEX ENDO 45 340 (ENDOMECHANICALS) ×3 IMPLANT
DERMABOND ADVANCED (GAUZE/BANDAGES/DRESSINGS) ×1
DERMABOND ADVANCED .7 DNX12 (GAUZE/BANDAGES/DRESSINGS) ×2 IMPLANT
DISSECTOR BLUNT TIP ENDO 5MM (MISCELLANEOUS) ×3 IMPLANT
DRAPE CHEST BREAST 15X10 FENES (DRAPES) ×3 IMPLANT
DRAPE LAPAROSCOPIC ABDOMINAL (DRAPES) IMPLANT
DRAPE SLUSH/WARMER DISC (DRAPES) ×3 IMPLANT
DRAPE WARM FLUID 44X44 (DRAPE) IMPLANT
DRILL BIT 7/64X5 (BIT) IMPLANT
ELECT BLADE 4.0 EZ CLEAN MEGAD (MISCELLANEOUS)
ELECT BLADE 6.5 EXT (BLADE) ×3 IMPLANT
ELECT REM PT RETURN 9FT ADLT (ELECTROSURGICAL) ×3
ELECTRODE BLDE 4.0 EZ CLN MEGD (MISCELLANEOUS) IMPLANT
ELECTRODE REM PT RTRN 9FT ADLT (ELECTROSURGICAL) ×2 IMPLANT
FELT TEFLON 1X6 (MISCELLANEOUS) ×3 IMPLANT
FORCEPS BIOP RJ4 1.8 (CUTTING FORCEPS) IMPLANT
GAUZE SPONGE 4X4 12PLY STRL (GAUZE/BANDAGES/DRESSINGS) ×3 IMPLANT
GLOVE BIO SURGEON STRL SZ 6.5 (GLOVE) ×6 IMPLANT
GLOVE BIOGEL M STER SZ 6 (GLOVE) ×6 IMPLANT
GLOVE BIOGEL M STRL SZ7.5 (GLOVE) ×3 IMPLANT
GLOVE BIOGEL PI IND STRL 7.0 (GLOVE) ×8 IMPLANT
GLOVE BIOGEL PI INDICATOR 7.0 (GLOVE) ×4
GOWN STRL REUS W/ TWL LRG LVL3 (GOWN DISPOSABLE) ×12 IMPLANT
GOWN STRL REUS W/TWL LRG LVL3 (GOWN DISPOSABLE) ×6
KIT BASIN OR (CUSTOM PROCEDURE TRAY) ×3 IMPLANT
KIT CLEAN ENDO COMPLIANCE (KITS) ×3 IMPLANT
KIT ROOM TURNOVER OR (KITS) ×3 IMPLANT
KIT SUCTION CATH 14FR (SUCTIONS) ×3 IMPLANT
MARKER SKIN DUAL TIP RULER LAB (MISCELLANEOUS) IMPLANT
NEEDLE BIOPSY TRANSBRONCH 21G (NEEDLE) IMPLANT
NS IRRIG 1000ML POUR BTL (IV SOLUTION) ×9 IMPLANT
OIL SILICONE PENTAX (PARTS (SERVICE/REPAIRS)) ×3 IMPLANT
PACK CHEST (CUSTOM PROCEDURE TRAY) ×3 IMPLANT
PAD ARMBOARD 7.5X6 YLW CONV (MISCELLANEOUS) ×6 IMPLANT
PASSER SUT SWANSON 36MM LOOP (INSTRUMENTS) IMPLANT
RELOAD STAPLER GOLD 60MM (STAPLE) ×2 IMPLANT
SEALANT PROGEL (MISCELLANEOUS) IMPLANT
SEALANT SURG COSEAL 4ML (VASCULAR PRODUCTS) IMPLANT
SEALANT SURG COSEAL 8ML (VASCULAR PRODUCTS) IMPLANT
SOLUTION ANTI FOG 6CC (MISCELLANEOUS) ×3 IMPLANT
SPONGE GAUZE 4X4 12PLY STER LF (GAUZE/BANDAGES/DRESSINGS) ×3 IMPLANT
SPONGE TONSIL 1 RF SGL (DISPOSABLE) ×3 IMPLANT
STAPLE ECHEON FLEX 60 POW ENDO (STAPLE) ×3 IMPLANT
STAPLE RELOAD 45MM GOLD (STAPLE) ×6 IMPLANT
STAPLER RELOAD GOLD 60MM (STAPLE) ×3
SUT PROLENE 3 0 SH DA (SUTURE) IMPLANT
SUT PROLENE 4 0 RB 1 (SUTURE) ×1
SUT PROLENE 4-0 RB1 .5 CRCL 36 (SUTURE) ×2 IMPLANT
SUT SILK  1 MH (SUTURE) ×1
SUT SILK 1 MH (SUTURE) ×2 IMPLANT
SUT SILK 2 0SH CR/8 30 (SUTURE) IMPLANT
SUT SILK 3 0SH CR/8 30 (SUTURE) IMPLANT
SUT VIC AB 1 CTX 18 (SUTURE) ×3 IMPLANT
SUT VIC AB 1 CTX 36 (SUTURE)
SUT VIC AB 1 CTX36XBRD ANBCTR (SUTURE) IMPLANT
SUT VIC AB 2-0 CTX 36 (SUTURE) ×3 IMPLANT
SUT VIC AB 2-0 UR6 27 (SUTURE) ×6 IMPLANT
SUT VIC AB 3-0 X1 27 (SUTURE) ×9 IMPLANT
SUT VICRYL 2 TP 1 (SUTURE) ×3 IMPLANT
SWAB COLLECTION DEVICE MRSA (MISCELLANEOUS) IMPLANT
SYR 10ML LL (SYRINGE) ×3 IMPLANT
SYR 20ML ECCENTRIC (SYRINGE) ×3 IMPLANT
SYSTEM SAHARA CHEST DRAIN ATS (WOUND CARE) ×3 IMPLANT
TAPE CLOTH 4X10 WHT NS (GAUZE/BANDAGES/DRESSINGS) ×3 IMPLANT
TAPE CLOTH SURG 4X10 WHT LF (GAUZE/BANDAGES/DRESSINGS) ×3 IMPLANT
TIP APPLICATOR SPRAY EXTEND 16 (VASCULAR PRODUCTS) IMPLANT
TOWEL OR 17X24 6PK STRL BLUE (TOWEL DISPOSABLE) IMPLANT
TOWEL OR 17X26 10 PK STRL BLUE (TOWEL DISPOSABLE) ×3 IMPLANT
TRAP SPECIMEN MUCOUS 40CC (MISCELLANEOUS) ×3 IMPLANT
TRAY FOLEY CATH SILVER 16FR LF (SET/KITS/TRAYS/PACK) ×3 IMPLANT
TROCAR BLADELESS 12MM (ENDOMECHANICALS) IMPLANT
TROCAR BLADELESS 5M (ENDOMECHANICALS) ×3 IMPLANT
TUBE ANAEROBIC SPECIMEN COL (MISCELLANEOUS) IMPLANT
TUBE CONNECTING 20X1/4 (TUBING) ×3 IMPLANT
WATER STERILE IRR 1000ML POUR (IV SOLUTION) ×6 IMPLANT

## 2016-05-18 NOTE — Progress Notes (Signed)
Patient ID: Jennefer Bravo., male   DOB: 22-Sep-1955, 61 y.o.   MRN: [id number] EVENING ROUNDS NOTE :     [address]       [place] [address]             [phone]                 Day of Surgery Procedure(s) (LRB): VIDEO BRONCHOSCOPY WITH BRONCHIAL WASHING (N/A) VIDEO ASSISTED THORACOSCOPY WITH REMOVAL OF LEFT APICAL BLEB (Left)  Total Length of Stay:  LOS: 7 days  BP (!) 147/73   Pulse 98   Temp 98.5 F (36.9 C) (Oral)   Resp (!) 22   Ht '6\' 3"'$  (1.905 m)   Wt 141 lb 8.6 oz (64.2 kg)   SpO2 96%   BMI 17.69 kg/m   .Intake/Output      01/26 0701 - 01/27 0700   P.O.    I.V. (mL/kg) 1500 (23.4)   Total Intake(mL/kg) 1500 (23.4)   Urine (mL/kg/hr) 425 (0.5)   Blood 250 (0.3)   Chest Tube 120 (0.1)   Total Output 795   Net +705            Lab Results  Component Value Date   WBC 11.7 (H) 05/18/2016   HGB 10.8 (L) 05/18/2016   HCT 32.0 (L) 05/18/2016   PLT 326 05/18/2016   GLUCOSE 108 (H) 05/18/2016   ALT 17 05/18/2016   AST 20 05/18/2016   NA 130 (L) 05/18/2016   K 4.0 05/18/2016   CL 96 (L) 05/18/2016   CREATININE 0.70 05/18/2016   BUN 10 05/18/2016   CO2 27 05/18/2016   INR 1.13 05/18/2016   Stable in icu early post op No air leak  [name] MD  [facility]eeper [phone] Office [phone] 05/18/2016 9:16 PM

## 2016-05-18 NOTE — Anesthesia Procedure Notes (Signed)
Procedure Name: Intubation Date/Time: 05/18/2016 3:30 PM Performed by: Shirlyn Goltz Pre-anesthesia Checklist: Patient identified, Emergency Drugs available, Suction available and Patient being monitored Patient Re-evaluated:Patient Re-evaluated prior to inductionOxygen Delivery Method: Circle system utilized Preoxygenation: Pre-oxygenation with 100% oxygen Intubation Type: IV induction Ventilation: Mask ventilation without difficulty Laryngoscope Size: Mac and 4 Grade View: Grade I Tube type: Oral Tube size: 8.5 mm Number of attempts: 1 Airway Equipment and Method: Stylet Placement Confirmation: ETT inserted through vocal cords under direct vision,  breath sounds checked- equal and bilateral and positive ETCO2 Secured at: 22 cm Tube secured with: Tape Dental Injury: Teeth and Oropharynx as per pre-operative assessment

## 2016-05-18 NOTE — Anesthesia Preprocedure Evaluation (Addendum)
Anesthesia Evaluation  Patient identified by MRN, date of birth, ID band Patient awake    Reviewed: Allergy & Precautions, NPO status , Patient's Chart, lab work & pertinent test results  Airway Mallampati: II  TM Distance: >3 FB Neck ROM: Full    Dental  (+) Edentulous Upper, Edentulous Lower   Pulmonary former smoker,   Continuous air leak over left lung  + rhonchi        Cardiovascular hypertension,  Rhythm:Regular Rate:Normal     Neuro/Psych    GI/Hepatic   Endo/Other    Renal/GU      Musculoskeletal   Abdominal   Peds  Hematology   Anesthesia Other Findings   Reproductive/Obstetrics                            Anesthesia Physical Anesthesia Plan  ASA: III  Anesthesia Plan: General   Post-op Pain Management:    Induction: Intravenous  Airway Management Planned: Double Lumen EBT  Additional Equipment: Arterial line and CVP  Intra-op Plan:   Post-operative Plan: Possible Post-op intubation/ventilation  Informed Consent: I have reviewed the patients History and Physical, chart, labs and discussed the procedure including the risks, benefits and alternatives for the proposed anesthesia with the patient or authorized representative who has indicated his/her understanding and acceptance.     Plan Discussed with: CRNA and Anesthesiologist  Anesthesia Plan Comments:         Anesthesia Quick Evaluation

## 2016-05-18 NOTE — Anesthesia Postprocedure Evaluation (Signed)
Anesthesia Post Note  Patient: William LANGDON [name]  Procedure(s) Performed: Procedure(s) (LRB): VIDEO BRONCHOSCOPY WITH BRONCHIAL WASHING (N/A) VIDEO ASSISTED THORACOSCOPY WITH REMOVAL OF LEFT APICAL BLEB (Left)  Patient location during evaluation: PACU Anesthesia Type: General Level of consciousness: awake and alert Pain management: pain level controlled Vital Signs Assessment: post-procedure vital signs reviewed and stable Respiratory status: spontaneous breathing, nonlabored ventilation, respiratory function stable and patient connected to nasal cannula oxygen Cardiovascular status: blood pressure returned to baseline and stable Postop Assessment: no signs of nausea or vomiting Anesthetic complications: no       Last Vitals:  Vitals:   05/18/16 1836 05/18/16 1900  BP: (!) 153/88 (!) 144/86  Pulse:  95  Resp:  17  Temp:      Last Pain:  Vitals:   05/18/16 1815  TempSrc:   PainSc: 4                  Loana Salvaggio,W. EDMOND

## 2016-05-18 NOTE — Progress Notes (Signed)
Arterial blood gas obtained while patient was on room air

## 2016-05-18 NOTE — H&P (View-Only) (Signed)
Patient ID: [name]., male   DOB: 02/25/1956, 61 y.o.   MRN: [id number]      [address]       [place] [address]             [phone]                 6 Days Post-Op Procedure(s) (LRB): VIDEO BRONCHOSCOPY, LEFT LUNG (N/A) LEFT VIDEO ASSISTED THORACOSCOPY (VATS) (Left) LYMPH NODE DISSECTION (Left) LEFT LOWER LOBE LOBECTOMY AND LEFT UPPER LOBE  RESECTION AND PLACEMENT OF ON-Q (Left) STAPLING OF APICAL BLEB (Left)  LOS: 6 days   Subjective: Continued air leak  Objective: Vital signs in last 24 hours: Patient Vitals for the past 24 hrs:  BP Temp Temp src Pulse Resp SpO2 Weight  05/17/16 1700 133/70 - - 72 19 95 % -  05/17/16 1603 - 97.5 F (36.4 C) Oral - - - -  05/17/16 1600 124/71 - - 72 19 95 % -  05/17/16 1500 121/66 - - 81 (!) 24 96 % -  05/17/16 1400 116/65 - - 92 (!) 23 96 % -  05/17/16 1300 119/63 - - 72 (!) 23 97 % -  05/17/16 1215 - 98.4 F (36.9 C) Oral - - - -  05/17/16 1200 116/60 - - 69 18 97 % -  05/17/16 1100 111/71 - - 76 20 97 % -  05/17/16 1000 105/68 - - 100 (!) 24 95 % -  05/17/16 0900 123/76 - - 88 (!) 21 95 % -  05/17/16 0841 - - - 91 20 96 % -  05/17/16 0809 - 98.3 F (36.8 C) Oral - - - -  05/17/16 0800 121/76 - - 98 (!) 22 92 % -  05/17/16 0700 133/74 - - 74 (!) 23 93 % -  05/17/16 0600 131/75 - - 89 16 94 % -  05/17/16 0500 (!) 157/85 - - 90 (!) 21 96 % 141 lb 8.6 oz (64.2 kg)  05/17/16 0400 133/73 99.1 F (37.3 C) Oral 84 19 91 % -  05/17/16 0300 132/74 - - 98 (!) 25 92 % -  05/17/16 0200 133/76 - - 90 (!) 22 93 % -  05/17/16 0100 134/68 - - 82 16 96 % -  05/17/16 0001 - 99.4 F (37.4 C) Oral - - - -  05/17/16 0000 138/70 - - 89 (!) 21 100 % -  05/16/16 2300 137/67 - - 82 (!) 22 100 % -  05/16/16 2200 121/77 - - 90 (!) 23 95 % -  05/16/16 2100 (!) 124/104 - - 100 (!) 22 92 % -  05/16/16 2034 - - - - - 95 % -  05/16/16 2000 137/76 - - 93 (!) 24 94 % -  05/16/16 1942 - 99.7 F (37.6 C) Oral - - - -  05/16/16  1900 124/77 - - 94 (!) 23 94 % -  05/16/16 1800 126/82 - - 98 (!) 25 97 % -    Filed Weights   05/13/16 0600 05/16/16 0400 05/17/16 0500  Weight: 147 lb 11.3 oz (67 kg) 141 lb 15.6 oz (64.4 kg) 141 lb 8.6 oz (64.2 kg)    Hemodynamic parameters for last 24 hours:    Intake/Output from previous day: 01/24 0701 - 01/25 0700 In: 960 [P.O.:960] Out: 1695 [Urine:1525; Chest Tube:170] Intake/Output this shift: Total I/O In: 240 [P.O.:240] Out: 520 [Urine:360; Chest Tube:160]  Scheduled Meds: . bisacodyl  10 mg  Oral Daily  . enoxaparin (LOVENOX) injection  40 mg Subcutaneous Q24H  . insulin aspart  0-24 Units Subcutaneous TID AC & HS  . levalbuterol  0.63 mg Nebulization BID  . mouth rinse  15 mL Mouth Rinse BID  . senna-docusate  1 tablet Oral QHS  . sodium chloride flush  10-40 mL Intracatheter Q12H   Continuous Infusions: . dextrose 5 % and 0.45% NaCl Stopped (05/15/16 1200)   PRN Meds:.oxyCODONE, potassium chloride (KCL MULTIRUN) 30 mEq in 265 mL IVPB, sodium chloride flush    Lab Results: CBC: Recent Labs  05/15/16 0230 05/17/16 0500  WBC 11.4* 17.6*  HGB 11.1* 11.4*  HCT 33.5* 33.5*  PLT 331 361   BMET:  Recent Labs  05/15/16 0230 05/17/16 0500  NA 133* 133*  K 3.6 3.9  CL 99* 97*  CO2 28 26  GLUCOSE 98 102*  BUN 5* 9  CREATININE 0.64 0.70  CALCIUM 8.9 9.1    PT/INR: No results for input(s): LABPROT, INR in the last 72 hours.   Radiology Dg Chest Port 1 View  Result Date: 05/17/2016 CLINICAL DATA:  Placement of chest tube for pneumothorax after left lower lobectomy EXAM: PORTABLE CHEST 1 VIEW COMPARISON:  Chest x-ray of 05/17/2017 FINDINGS: There has been a decrease in volume of the left pneumothorax with an apical component remaining, with left chest tubes present. The right lung is clear. Right IJ central venous line tip overlies the mid upper SVC. Cardiomegaly is stable. Left chest wall subcutaneous air again is noted. IMPRESSION: 1. Decrease in  size of left pneumothorax with left chest tubes present. A left apical component of the pneumothorax persists. 2. Stable position of right IJ central venous line. Electronically Signed   By: Ivar Drape M.D.   On: 05/17/2016 16:17   Dg Chest Port 1 View  Result Date: 05/17/2016 CLINICAL DATA:  Left pneumothorax, chest pain EXAM: PORTABLE CHEST 1 VIEW COMPARISON:  05/16/2016 FINDINGS: Cardiac shadow is stable. Two left thoracostomy catheters are identified. The left pneumothorax has increased in the interval from the prior exam. The right lung remains well aerated. Right jugular central line is seen. No focal infiltrate is noted. IMPRESSION: Significant enlargement of left pneumothorax These results will be called to the ordering clinician or representative by the Radiologist Assistant, and communication documented in the PACS or zVision Dashboard. Electronically Signed   By: Inez Catalina M.D.   On: 05/17/2016 07:52   Dg Chest Port 1 View  Result Date: 05/16/2016 CLINICAL DATA:  History of left lower lobe lung carcinoma. Status post left lower lobectomy and left upper lobe resection 05/11/2016. Pneumothorax. Chest tubes in place. EXAM: PORTABLE CHEST 1 VIEW COMPARISON:  Single-view of the chest 05/15/2016 05/14/2016. FINDINGS: Right IJ catheter and 2 left chest tubes remain in place. Left apical pneumothorax is unchanged. Subcutaneous emphysema along the left chest wall is again seen. Mild basilar atelectasis is seen on the right. The lungs are emphysematous. Heart size is upper normal. IMPRESSION: No change in the left apical pneumothorax with 2 chest tubes in place. Emphysema. Electronically Signed   By: Inge Rise M.D.   On: 05/16/2016 08:22     Assessment/Plan: S/P Procedure(s) (LRB): VIDEO BRONCHOSCOPY, LEFT LUNG (N/A) LEFT VIDEO ASSISTED THORACOSCOPY (VATS) (Left) LYMPH NODE DISSECTION (Left) LEFT LOWER LOBE LOBECTOMY AND LEFT UPPER LOBE  RESECTION AND PLACEMENT OF ON-Q (Left) STAPLING  OF APICAL BLEB (Left)  Chest xray improved this pm, but still air leak, large Discussed with patientandwife repeat  left vats tomorrow to attempt identify leak site    [name] MD 05/17/2016 5:55 PM

## 2016-05-18 NOTE — Interval H&P Note (Signed)
Progress / Preop  Interval Note:  05/18/2016 2:39 PM  Newcomb.  has presented today for surgery, with the diagnosis of AIR LEAK BLEBS  The various methods of treatment have been discussed with the patient and family. After consideration of risks, benefits and other options for treatment, the patient has consented to  Procedure(s): VIDEO BRONCHOSCOPY (N/A) VIDEO ASSISTED THORACOSCOPY (Left) as a surgical intervention .  The patient's history has been reviewed, patient examined, no change in status, stable for surgery.  I have reviewed the patient's chart and labs.  Questions were answered to the patient's satisfaction.   Lab Results  Component Value Date   WBC 11.7 (H) 05/18/2016   HGB 10.8 (L) 05/18/2016   HCT 32.0 (L) 05/18/2016   PLT 326 05/18/2016   GLUCOSE 108 (H) 05/18/2016   ALT 17 05/18/2016   AST 20 05/18/2016   NA 130 (L) 05/18/2016   K 4.0 05/18/2016   CL 96 (L) 05/18/2016   CREATININE 0.70 05/18/2016   BUN 10 05/18/2016   CO2 27 05/18/2016   INR 1.13 05/18/2016     [name]

## 2016-05-18 NOTE — Transfer of Care (Signed)
Immediate Anesthesia Transfer of Care Note  Patient: William Kales [name]  Procedure(s) Performed: Procedure(s): VIDEO BRONCHOSCOPY WITH BRONCHIAL WASHING (N/A) VIDEO ASSISTED THORACOSCOPY WITH REMOVAL OF LEFT APICAL BLEB (Left)  Patient Location: PACU  Anesthesia Type:General  Level of Consciousness: awake, alert , oriented and patient cooperative  Airway & Oxygen Therapy: Patient Spontanous Breathing and Patient connected to face mask oxygen  Post-op Assessment: Report given to RN and Post -op Vital signs reviewed and stable  Post vital signs: Reviewed and stable  Last Vitals:  Vitals:   05/18/16 1138 05/18/16 1200  BP:  126/60  Pulse:  61  Resp:  (!) 25  Temp: 36.5 C     Last Pain:  Vitals:   05/18/16 1226  TempSrc:   PainSc: Asleep      Patients Stated Pain Goal: 3 (81/27/51 7001)  Complications: No apparent anesthesia complications

## 2016-05-18 NOTE — Brief Op Note (Addendum)
05/11/2016 - 05/18/2016  5:51 PM  PATIENT:  William Kales [name]  61 y.o. male  PRE-OPERATIVE DIAGNOSIS:  AIR LEAK BLEBS  POST-OPERATIVE DIAGNOSIS:  AIR LEAK BLEBS  PROCEDURE:  Procedure(s): VIDEO BRONCHOSCOPY WITH BRONCHIAL WASHING (N/A) VIDEO ASSISTED THORACOSCOPY WITH REMOVAL OF LEFT APICAL BLEB (Left) Stapling of apical bleb  SURGEON:  Surgeon(s) and Role:    * [name], MD - Primary  PHYSICIAN ASSISTANT: WAYNE GOLD PA-C  ANESTHESIA:   general  EBL:  Total I/O In: 1000 [I.V.:1000] Out: 375 [Urine:125; Blood:250]  BLOOD ADMINISTERED:none  DRAINS: 2 CHEST TUBES IN THE LEFT HEMITHORAX  LOCAL MEDICATIONS USED:  NONE  SPECIMEN:  Source of Specimen:  LEFT APICAL BLEB  DISPOSITION OF SPECIMEN:  PATHOLOGY  COUNTS:  YES  TOURNIQUET:  * No tourniquets in log *  DICTATION: .Other Dictation: Dictation Number PENDING  PLAN OF CARE: Admit to inpatient   PATIENT DISPOSITION:  PACU - hemodynamically stable.   Delay start of Pharmacological VTE agent (>24hrs) due to surgical blood loss or risk of bleeding: yes  COMPLICATIONS : NO KNOWN

## 2016-05-19 ENCOUNTER — Encounter (HOSPITAL_COMMUNITY): Payer: Self-pay | Admitting: Cardiothoracic Surgery

## 2016-05-19 ENCOUNTER — Inpatient Hospital Stay (HOSPITAL_COMMUNITY): Payer: BLUE CROSS/BLUE SHIELD

## 2016-05-19 LAB — CBC
HCT: 32.3 % — ABNORMAL LOW (ref 39.0–52.0)
Hemoglobin: 10.8 g/dL — ABNORMAL LOW (ref 13.0–17.0)
MCH: 29.8 pg (ref 26.0–34.0)
MCHC: 33.4 g/dL (ref 30.0–36.0)
MCV: 89.2 fL (ref 78.0–100.0)
Platelets: 366 10*3/uL (ref 150–400)
RBC: 3.62 MIL/uL — ABNORMAL LOW (ref 4.22–5.81)
RDW: 13.4 % (ref 11.5–15.5)
WBC: 14.7 10*3/uL — ABNORMAL HIGH (ref 4.0–10.5)

## 2016-05-19 LAB — BASIC METABOLIC PANEL
Anion gap: 10 (ref 5–15)
BUN: 11 mg/dL (ref 6–20)
CO2: 24 mmol/L (ref 22–32)
Calcium: 8.8 mg/dL — ABNORMAL LOW (ref 8.9–10.3)
Chloride: 97 mmol/L — ABNORMAL LOW (ref 101–111)
Creatinine, Ser: 0.73 mg/dL (ref 0.61–1.24)
GFR calc Af Amer: >60 mL/min (ref >60)
GFR calc non Af Amer: >60 mL/min (ref >60)
Glucose, Bld: 99 mg/dL (ref 65–99)
Potassium: 4.2 mmol/L (ref 3.5–5.1)
Sodium: 131 mmol/L — ABNORMAL LOW (ref 135–145)

## 2016-05-19 MED ORDER — ENOXAPARIN SODIUM 40 MG/0.4ML ~~LOC~~ SOLN
40.0000 mg | SUBCUTANEOUS | Status: DC
  Administered 2016-05-19 – 2016-05-23 (×5): 40 mg via SUBCUTANEOUS
  Filled 2016-05-19 (×6): qty 0.4

## 2016-05-19 NOTE — Progress Notes (Signed)
Patient ID: William Bravo., male   DOB: May 27, 1955, 61 y.o.   MRN: [id number] TCTS DAILY ICU PROGRESS NOTE                   [address]            [place] [address]          [phone]   1 Day Post-Op Procedure(s) (LRB): VIDEO BRONCHOSCOPY WITH BRONCHIAL WASHING (N/A) VIDEO ASSISTED THORACOSCOPY WITH REMOVAL OF LEFT APICAL BLEB (Left)  Total Length of Stay:  LOS: 8 days   Subjective: Feels better, no air leak today   Objective: Vital signs in last 24 hours: Temp:  [97.5 F (36.4 C)-99.2 F (37.3 C)] 97.8 F (36.6 C) (01/27 1100) Pulse Rate:  [70-103] 78 (01/27 1400) Cardiac Rhythm: Normal sinus rhythm (01/27 1400) Resp:  [15-26] 24 (01/27 1400) BP: (109-155)/(66-98) 134/72 (01/27 1400) SpO2:  [90 %-100 %] 96 % (01/27 1400) Arterial Line BP: (129-209)/(50-84) 160/62 (01/27 0800) Weight:  [139 lb 8.8 oz (63.3 kg)] 139 lb 8.8 oz (63.3 kg) (01/27 0600)  Filed Weights   05/16/16 0400 05/17/16 0500 05/19/16 0600  Weight: 141 lb 15.6 oz (64.4 kg) 141 lb 8.6 oz (64.2 kg) 139 lb 8.8 oz (63.3 kg)    Weight change:    Hemodynamic parameters for last 24 hours:    Intake/Output from previous day: 01/26 0701 - 01/27 0700 In: 1550 [I.V.:1500; IV Piggyback:50] Out: 9629 [Urine:845; Blood:250; Chest Tube:250]  Intake/Output this shift: Total I/O In: 470 [P.O.:420; IV Piggyback:50] Out: 320 [Urine:270; Chest Tube:50]  Current Meds: Scheduled Meds: . bisacodyl  10 mg Oral Daily  . cefUROXime (ZINACEF)  IV  1.5 g Intravenous Q12H  . enoxaparin (LOVENOX) injection  40 mg Subcutaneous Q24H  . levalbuterol  0.63 mg Nebulization BID  . mouth rinse  15 mL Mouth Rinse BID  . senna-docusate  1 tablet Oral QHS  . sodium chloride flush  10-40 mL Intracatheter Q12H   Continuous Infusions: PRN Meds:.oxyCODONE, potassium chloride (KCL MULTIRUN) 30 mEq in 265 mL IVPB, sodium chloride flush  General appearance: alert and cooperative Neurologic: intact Heart:  regular rate and rhythm, S1, S2 normal, no murmur, click, rub or gallop Lungs: clear to auscultation bilaterally and normal percussion bilaterally Abdomen: soft, non-tender; bowel sounds normal; no masses,  no organomegaly Extremities: extremities normal, atraumatic, no cyanosis or edema Wound: no air leak  Lab Results: CBC: Recent Labs  05/18/16 0317 05/19/16 0427  WBC 11.7* 14.7*  HGB 10.8* 10.8*  HCT 32.0* 32.3*  PLT 326 366   BMET:  Recent Labs  05/18/16 0317 05/19/16 0427  NA 130* 131*  K 4.0 4.2  CL 96* 97*  CO2 27 24  GLUCOSE 108* 99  BUN 10 11  CREATININE 0.70 0.73  CALCIUM 8.9 8.8*    CMET: Lab Results  Component Value Date   WBC 14.7 (H) 05/19/2016   HGB 10.8 (L) 05/19/2016   HCT 32.3 (L) 05/19/2016   PLT 366 05/19/2016   GLUCOSE 99 05/19/2016   ALT 17 05/18/2016   AST 20 05/18/2016   NA 131 (L) 05/19/2016   K 4.2 05/19/2016   CL 97 (L) 05/19/2016   CREATININE 0.73 05/19/2016   BUN 11 05/19/2016   CO2 24 05/19/2016   INR 1.13 05/18/2016      PT/INR:  Recent Labs  05/18/16 0317  LABPROT 14.6  INR 1.13   Radiology: Dg Chest [facility] 1 View  Result  Date: 05/19/2016 CLINICAL DATA:  Chest tube EXAM: PORTABLE CHEST 1 VIEW COMPARISON:  05/18/2016 FINDINGS: Two left chest tubes remain in place with moderate left pneumothorax and subcutaneous emphysema, not significantly changed. Hyperinflation/ COPD. Heart is mildly enlarged. No confluent airspace opacities. Right central line is unchanged. IMPRESSION: Two left chest tubes remain in place with moderate left apical pneumothorax, stable. COPD. Electronically Signed   By: Rolm Baptise M.D.   On: 05/19/2016 07:30     Assessment/Plan: S/P Procedure(s) (LRB): VIDEO BRONCHOSCOPY WITH BRONCHIAL WASHING (N/A) VIDEO ASSISTED THORACOSCOPY WITH REMOVAL OF LEFT APICAL BLEB (Left) Mobilize Apical PTX on film today but no air leak    [name] 05/19/2016 4:32 PM

## 2016-05-20 ENCOUNTER — Inpatient Hospital Stay (HOSPITAL_COMMUNITY): Payer: BLUE CROSS/BLUE SHIELD

## 2016-05-20 LAB — CBC
HCT: 30.3 % — ABNORMAL LOW (ref 39.0–52.0)
Hemoglobin: 10.1 g/dL — ABNORMAL LOW (ref 13.0–17.0)
MCH: 29.9 pg (ref 26.0–34.0)
MCHC: 33.3 g/dL (ref 30.0–36.0)
MCV: 89.6 fL (ref 78.0–100.0)
Platelets: 369 10*3/uL (ref 150–400)
RBC: 3.38 MIL/uL — ABNORMAL LOW (ref 4.22–5.81)
RDW: 13.4 % (ref 11.5–15.5)
WBC: 9.7 10*3/uL (ref 4.0–10.5)

## 2016-05-20 LAB — BASIC METABOLIC PANEL
Anion gap: 9 (ref 5–15)
BUN: 9 mg/dL (ref 6–20)
CO2: 28 mmol/L (ref 22–32)
Calcium: 8.8 mg/dL — ABNORMAL LOW (ref 8.9–10.3)
Chloride: 95 mmol/L — ABNORMAL LOW (ref 101–111)
Creatinine, Ser: 0.63 mg/dL (ref 0.61–1.24)
GFR calc Af Amer: >60 mL/min (ref >60)
GFR calc non Af Amer: >60 mL/min (ref >60)
Glucose, Bld: 101 mg/dL — ABNORMAL HIGH (ref 65–99)
Potassium: 3.8 mmol/L (ref 3.5–5.1)
Sodium: 132 mmol/L — ABNORMAL LOW (ref 135–145)

## 2016-05-20 MED ORDER — ENSURE ENLIVE PO LIQD
237.0000 mL | Freq: Three times a day (TID) | ORAL | Status: DC
  Administered 2016-05-20 – 2016-05-24 (×10): 237 mL via ORAL

## 2016-05-20 NOTE — Progress Notes (Signed)
Attempted to call report to 2W23 RN unavailable at this time; will attempt at a later time.  Ruben Reason

## 2016-05-20 NOTE — Progress Notes (Signed)
Patient ID: [name]., male   DOB: 05/30/1955, 61 y.o.   MRN: [id number] TCTS DAILY ICU PROGRESS NOTE                   [address]            ,[place] [address]          [phone]   2 Days Post-Op Procedure(s) (LRB): VIDEO BRONCHOSCOPY WITH BRONCHIAL WASHING (N/A) VIDEO ASSISTED THORACOSCOPY WITH REMOVAL OF LEFT APICAL BLEB (Left)  Total Length of Stay:  LOS: 9 days   Subjective: Awake and alert, walked around the unit already this morning  Objective: Vital signs in last 24 hours: Temp:  [97.3 F (36.3 C)-98.3 F (36.8 C)] 97.6 F (36.4 C) (01/28 0700) Pulse Rate:  [59-95] 75 (01/28 0832) Cardiac Rhythm: Normal sinus rhythm (01/28 0800) Resp:  [12-26] 19 (01/28 0832) BP: (111-134)/(60-78) 133/69 (01/28 0800) SpO2:  [93 %-98 %] 98 % (01/28 0832) Weight:  [138 lb 3.7 oz (62.7 kg)] 138 lb 3.7 oz (62.7 kg) (01/28 0600)  Filed Weights   05/17/16 0500 05/19/16 0600 05/20/16 0600  Weight: 141 lb 8.6 oz (64.2 kg) 139 lb 8.8 oz (63.3 kg) 138 lb 3.7 oz (62.7 kg)    Weight change: -1 lb 5.2 oz (-0.6 kg)   Hemodynamic parameters for last 24 hours:    Intake/Output from previous day: 01/27 0701 - 01/28 0700 In: 520 [P.O.:420; IV Piggyback:100] Out: 735 [Urine:445; Chest Tube:290]  Intake/Output this shift: Total I/O In: 240 [P.O.:240] Out: 325 [Urine:325]  Current Meds: Scheduled Meds: . bisacodyl  10 mg Oral Daily  . cefUROXime (ZINACEF)  IV  1.5 g Intravenous Q12H  . enoxaparin (LOVENOX) injection  40 mg Subcutaneous Q24H  . levalbuterol  0.63 mg Nebulization BID  . mouth rinse  15 mL Mouth Rinse BID  . senna-docusate  1 tablet Oral QHS  . sodium chloride flush  10-40 mL Intracatheter Q12H   Continuous Infusions: PRN Meds:.oxyCODONE, potassium chloride (KCL MULTIRUN) 30 mEq in 265 mL IVPB, sodium chloride flush  General appearance: alert and cooperative Neurologic: intact Heart: regular rate and rhythm, S1, S2 normal, no murmur, click,  rub or gallop Lungs: diminished breath sounds bibasilar Abdomen: soft, non-tender; bowel sounds normal; no masses,  no organomegaly Extremities: extremities normal, atraumatic, no cyanosis or edema and Homans sign is negative, no sign of DVT Wound: No air leak with coughing today  Lab Results: CBC: Recent Labs  05/19/16 0427 05/20/16 0400  WBC 14.7* 9.7  HGB 10.8* 10.1*  HCT 32.3* 30.3*  PLT 366 369   BMET:  Recent Labs  05/19/16 0427 05/20/16 0400  NA 131* 132*  K 4.2 3.8  CL 97* 95*  CO2 24 28  GLUCOSE 99 101*  BUN 11 9  CREATININE 0.73 0.63  CALCIUM 8.8* 8.8*    CMET: Lab Results  Component Value Date   WBC 9.7 05/20/2016   HGB 10.1 (L) 05/20/2016   HCT 30.3 (L) 05/20/2016   PLT 369 05/20/2016   GLUCOSE 101 (H) 05/20/2016   ALT 17 05/18/2016   AST 20 05/18/2016   NA 132 (L) 05/20/2016   K 3.8 05/20/2016   CL 95 (L) 05/20/2016   CREATININE 0.63 05/20/2016   BUN 9 05/20/2016   CO2 28 05/20/2016   INR 1.13 05/18/2016      PT/INR:  Recent Labs  05/18/16 0317  LABPROT 14.6  INR 1.13   Radiology: Dg Chest [facility]  1 View  Result Date: 05/20/2016 CLINICAL DATA:  Chest tube, aortic aneurysm EXAM: PORTABLE CHEST 1 VIEW COMPARISON:  05/19/2016 FINDINGS: 2 left chest tubes remain in place. Moderate left apical pneumothorax again noted, stable. Subcutaneous emphysema in the left chest wall. There is hyperinflation of the lungs compatible with COPD. Mild cardiomegaly. Left base atelectasis. IMPRESSION: Continued moderate left apical pneumothorax. COPD.  Left base atelectasis. Electronically Signed   By: Rolm Baptise M.D.   On: 05/20/2016 07:55     Assessment/Plan: S/P Procedure(s) (LRB): VIDEO BRONCHOSCOPY WITH BRONCHIAL WASHING (N/A) VIDEO ASSISTED THORACOSCOPY WITH REMOVAL OF LEFT APICAL BLEB (Left) Mobilize Plan for transfer to step-down: see transfer orders No air leak present, chest x-ray improved with minimal apical pneumothorax, will DC posterior chest  tube today in place remaining chest tube on waterseal    [name] 05/20/2016 8:59 AM

## 2016-05-20 NOTE — Progress Notes (Signed)
Report given to Helene Kelp, RN on 2W; will transfer pt; will cont. To monitor.  Ruben Reason

## 2016-05-20 NOTE — Progress Notes (Signed)
Pt ambulated entire distance from 2S08 tp 2W23 without any difficulty; RN in room upon transfer; daughter notified of pt's transfer; chart, SCD's, and personal belongings along for transfer.  [name]

## 2016-05-21 ENCOUNTER — Inpatient Hospital Stay (HOSPITAL_COMMUNITY): Payer: BLUE CROSS/BLUE SHIELD

## 2016-05-21 LAB — CULTURE, RESPIRATORY W GRAM STAIN: Culture: NORMAL

## 2016-05-21 NOTE — Discharge Instructions (Signed)
Pneumothorax Introduction A pneumothorax, commonly called a collapsed lung, is a condition in which air leaks from a lung and builds up in the space between the lung and the chest wall (pleural space). The air in a pneumothorax is trapped outside the lung and takes up space, preventing the lung from fully expanding. This is a condition that usually occurs suddenly. The buildup of air may be small or large. A small pneumothorax may go away on its own. When a pneumothorax is larger, it will often require medical treatment and hospitalization. What are the causes? A pneumothorax can sometimes happen quickly with no apparent cause. People with underlying lung problems, particularly COPD or emphysema, are at higher risk of pneumothorax. However, pneumothorax can happen quickly even in people with no prior known lung problems. Trauma, surgery, medical procedures, or injury to the chest wall can also cause a pneumothorax. What are the signs or symptoms? Sometimes a pneumothorax will have no symptoms. When symptoms are present, they can include:  Chest pain.  Shortness of breath.  Increased rate of breathing.  Bluish color to your lips or skin (cyanosis). How is this diagnosed? Pneumothorax is usually diagnosed by a chest X-ray or chest CT scan. Your health care provider will also take a medical history and perform a physical exam to determine why you may have a pneumothorax. How is this treated? A small pneumothorax may go away on its own without treatment. Extra oxygen can sometimes help a small pneumothorax go away more quickly. For a larger pneumothorax or a pneumothorax that is causing symptoms, a procedure is usually needed to drain the air.In some cases, the health care provider may drain the air using a needle. In other cases, a chest tube may be inserted into the pleural space. A chest tube is a small tube placed between the ribs and into the pleural space. This removes the extra air and allows  the lung to expand back to its normal size. A large pneumothorax will usually require a hospital stay. If there is ongoing air leakage into the pleural space, then the chest tube may need to remain in place for several days until the air leak has healed. In some cases, surgery may be needed. Follow these instructions at home:  Only take over-the-counter or prescription medicines as directed by your health care provider.  If a cough or pain makes it difficult for you to sleep at night, try sleeping in a semi-upright position in a recliner or by using 2 or 3 pillows.  Rest and limit activity as directed by your health care provider.  If you had a chest tube and it was removed, ask your health care provider when it is okay to remove the dressing. Until your health care provider says you can remove the dressing, do not allow it to get wet.  Do not smoke. Smoking is a risk factor for pneumothorax.  Do not fly in an airplane or scuba dive until your health care provider says it is okay.  Follow up with your health care provider as directed. Get help right away if:  You have increasing chest pain or shortness of breath.  You have a cough that is not controlled with suppressants.  You begin coughing up blood.  You have pain that is getting worse or is not controlled with medicines.  You cough up thick, discolored mucus (sputum) that is yellow to green in color.  You have redness, increasing pain, or discharge at the site where a  chest tube had been in place (if your pneumothorax was treated with a chest tube).  The site where your chest tube was located opens up.  You feel air coming out of the site where the chest tube was placed.  You have a fever or persistent symptoms for more than 2-3 days.  You have a fever and your symptoms suddenly get worse. This information is not intended to replace advice given to you by your health care provider. Make sure you discuss any questions you have  with your health care provider. Document Released: 04/09/2005 Document Revised: 09/15/2015 Document Reviewed: 09/02/2013  2017 [employer]

## 2016-05-21 NOTE — Op Note (Signed)
NAME:  [name], [name] NO.:  [id number]  MEDICAL RECORD NO.:  [id number]  LOCATION:                                 FACILITY:  PHYSICIAN:  Lanelle Bal, MD    DATE OF BIRTH:  Oct 26, 1955  DATE OF PROCEDURE:  05/18/2016 DATE OF DISCHARGE:                              OPERATIVE REPORT   PREOPERATIVE DIAGNOSIS:  Persistent air leak after left lower lobectomy and wedge resection of left upper lobe for stage IB adenocarcinoma of the lung.  POSTOPERATIVE DIAGNOSIS:  Persistent air leak after left lower lobectomy and wedge resection of left upper lobe for stage IB adenocarcinoma of the lung.  SURGICAL PROCEDURE:  Repeat left video-assisted thoracoscopy and stapling and excision of apical blebs.  SURGEON:  Lanelle Bal, MD  FIRST ASSISTANT:  John Giovanni, PA-C  BRIEF HISTORY:  The patient is a 61 year old male, who the a week previously had undergone a complex left lower lobectomy for a large superior segment tumor that was growing across the fissure.  At the time of this procedure, the patient was noted to have an air leak as we were completing the procedure and there was a small area at the apex of the lung, which had been adherent to the chest wall and had been dissected down to allow free movement during the wedge resection of the lung. This area had been stapled and at the completion of procedure, the patient had no air leak; however, very early postop he had developed second air leak and with increasing difficulty inflated the left lung. The usual maneuvers did not abate the air leak.  In fact, it became worse and we recommended to the patient repeat surgery to define the area of air leak.  Postoperatively otherwise the patient had been doing well.  He agreed and signed informed consent.  DESCRIPTION OF PROCEDURE:  With arterial line in place, the patient underwent general endotracheal anesthesia.  Appropriate time-out was performed.  Through the  single-lumen endotracheal tube, a fiberoptic bronchoscope was passed to the subsegmental level in both the left and right tracheobronchial tree.  The left lower lobe bronchus closure was intact without evidence of any breakdown in the bronchial staple lines. The scope was removed.  The double-lumen endotracheal tube was placed. The patient was turned in lateral decubitus position.  The left chest previously been marked was prepped with Betadine and draped in sterile manner.  The previously placed chest tubes were left in place and prepped in.  The small anterior port incision was created and a 5 mm scope introduced into the chest.  With gentle ventilation, there were area of moderate-sized bleb, more anterior than where we had stapled at the apex of the lung previously.  The old incision that we had used previously was then opened and using the scope and grabbers, we were able to manipulate the lung and identify an area of leak at the apex. Using a curved 45 stapler, there was an area where the lung was adherent to the anterior chest wall where this area was divided freeing up the lung so we could easily staple across the moderate-sized anterior apical bleb.  This  was accomplished with a 60 cm stapler.  After doing this, we again tested for air leak and there was no further air leak.  The 2 chest tubes previously had been placed for the left, the incision was reclosed with interrupted 0-Vicryl, running 3-0 Vicryl, and 3-0 subcuticular stitch in skin edges.  The port incision was closed with a single UR stitch in the muscle layer and a subcuticular stitch.  At the completion of the procedure, there was no air leak noted from the chest tubes.  Dry dressings were applied.  Sponge and needle count were reported as correct.  The patient was then awakened in the operating room and extubated and transferred to the recovery room for further postoperative care.  He tolerated the procedure without  obvious complications.  Sponge and needle counts were reported as correct after completion of procedure.     Lanelle Bal, MD   ______________________________ Lanelle Bal, MD    EG/MEDQ  D:  05/20/2016  T:  05/21/2016  Job:  [id number]

## 2016-05-21 NOTE — Progress Notes (Addendum)
      [address]       [place] [address]             [phone]      3 Days Post-Op Procedure(s) (LRB): VIDEO BRONCHOSCOPY WITH BRONCHIAL WASHING (N/A) VIDEO ASSISTED THORACOSCOPY WITH REMOVAL OF LEFT APICAL BLEB (Left)   Subjective:  States doing okay.  No specific complaints.  Objective: Vital signs in last 24 hours: Temp:  [98.1 F (36.7 C)-98.4 F (36.9 C)] 98.4 F (36.9 C) (01/29 0546) Pulse Rate:  [58-97] 66 (01/29 0546) Cardiac Rhythm: Normal sinus rhythm (01/29 0701) Resp:  [14-26] 18 (01/29 0546) BP: (112-144)/(62-77) 133/77 (01/29 0546) SpO2:  [94 %-99 %] 98 % (01/29 0741) FiO2 (%):  [36 %] 36 % (01/29 0741) Weight:  [136 lb 6.4 oz (61.9 kg)] 136 lb 6.4 oz (61.9 kg) (01/29 0546)  Intake/Output from previous day: 01/28 0701 - 01/29 0700 In: 530 [P.O.:480; IV Piggyback:50] Out: 577 [Urine:525; Chest Tube:52] Intake/Output this shift: Total I/O In: 260 [P.O.:240; I.V.:20] Out: -   General appearance: alert, cooperative and no distress Heart: regular rate and rhythm Lungs: clear to auscultation bilaterally Abdomen: soft, non-tender; bowel sounds normal; no masses,  no organomegaly Extremities: extremities normal, atraumatic, no cyanosis or edema Wound: clean and dry  Lab Results:  Recent Labs  05/19/16 0427 05/20/16 0400  WBC 14.7* 9.7  HGB 10.8* 10.1*  HCT 32.3* 30.3*  PLT 366 369   BMET:  Recent Labs  05/19/16 0427 05/20/16 0400  NA 131* 132*  K 4.2 3.8  CL 97* 95*  CO2 24 28  GLUCOSE 99 101*  BUN 11 9  CREATININE 0.73 0.63  CALCIUM 8.8* 8.8*    PT/INR: No results for input(s): LABPROT, INR in the last 72 hours. ABG    Component Value Date/Time   PHART 7.448 05/18/2016 0317   HCO3 27.6 05/18/2016 0317   TCO2 27 05/12/2016 0423   ACIDBASEDEF 1.0 05/11/2016 1444   O2SAT 85.7 05/18/2016 0317   CBG (last 3)  No results for input(s): GLUCAP in the last 72 hours.  Assessment/Plan: S/P Procedure(s)  (LRB): VIDEO BRONCHOSCOPY WITH BRONCHIAL WASHING (N/A) VIDEO ASSISTED THORACOSCOPY WITH REMOVAL OF LEFT APICAL BLEB (Left)  1. Chest tube- no air leak appreciated.Marland Kitchen CXR with tiny apical pneumothorax 2. CV- hemodynamically stable 3. Dispo- patient stable, no air leak present, pneumothorax stable, possibly remove chest tube today vs. Tomorrow.. Will defer to Dr. Servando Snare,   LOS: 10 days    [name], [name] 05/21/2016  No air leak Will d/c chest tube if xray  Is ok and no air leak D/c central line  I have seen and examined William Kales [name] and agree with the above assessment  and plan.  [name] MD Beeper [phone] Office [phone] 05/21/2016 5:30 PM

## 2016-05-21 NOTE — Discharge Summary (Signed)
Physician Discharge Summary  Patient ID: ABDALLAH HERN [name] MRN: [id number] DOB/AGE: 1955/12/01 61 y.o.  Admit date: 05/11/2016 Discharge date: 05/25/2016  Admission Diagnoses: Patient Active Problem List   Diagnosis Date Noted  . Lung cancer ([place]) 05/18/2016  . S/P partial lobectomy of lung 05/11/2016  . Mitral insufficiency 04/06/2016  . Malignant neoplasm of lower lobe of left lung ([place]) 04/05/2016  . AAA (abdominal aortic aneurysm) ([place]) 03/12/2016  . AAA (abdominal aortic aneurysm) without rupture ([place]) 02/04/2014    Discharge Diagnoses:  Active Problems:   S/P partial lobectomy of lung   Lung cancer [facility])   Discharged Condition: good  HPI:  [name] 61 y.o. male is seen in [facility]  for  evaluation of lung mass and follow up after needle biopsy  . Patient has long history tobacco use, stopped in November  . He had  a stent graft of a large abdominal aneurysm last month  . PFTs and CPX testing  and PET scan were done  for evaluation of his left lung mass. He's radiographic findings were reviewed in multidisciplinary thoracic oncology conference.  He is progressing well after his abdominal stent graft.   Hospital Course:  This is a 61 year old male who underwent a Bronchoscopy; left video-assisted thoracoscopy; mini thoracotomy; left lower lobectomy; wedge resection, left upper lobe; lymph node dissection; placement of On-Q on 05/11/2016 with Dr. Servando Snare. He tolerated the procedure well and was transferred to the ICU. He was extubated in a timely manner. POD 1 his arterial line was discontinued. We continued the chest tubes due to output. We continued to mobilize. On POD 2 his chest tubes were changed to water seal. He had minor abdominal discomfort. His PCA was discontinued and he continued to progress. POD 5 he was transferred to the telemetry floor. His chest tube remained due to a left apical pneumothorax . He remains on 3L [place]. Renal function remained stable. He continued to  have an air leak therefore he went back to the OR on 1/26 for a VATs with removal of apical bleb. POD 1 he progressed well. He had no chest tube leak but a small apical pneumothorax remained on chest xray. His posterior chest tube was discontinued on 1/28 without issue and the remaining chest tube was changed to water seal.  Follow up CXR showed stable appearance of apical pneumothorax.  His remaining chest tube did exhibit evidence of air leak.  CXR continued to remain stable.  His final chest tube was removed on 05/23/2016.  Follow up CXR showed Stable left apical and left mid posterior hemithorax hydropneumothoraces after removal of left chest tube .  He continues to ambulate independently.  His pain is well controlled.  He is felt medically stable for discharge home today.    Consults: None  Significant Diagnostic Studies:  CLINICAL DATA:  Chest tube.  Shortness of breath .  EXAM: PORTABLE CHEST 1 VIEW  COMPARISON:  05/20/2016 .  FINDINGS: Right IJ line and left chest tube in stable position. Stable small left apical pneumothorax. COPD. Right upper lobe subsegmental atelectasis. Surgical sutures noted over the left chest. Stable cardiomegaly. Left chest wall subcutaneous emphysema, improved from prior exam.  IMPRESSION: 1. Postsurgical changes left lung with left chest tube in stable position. Stable small left apical pneumothorax. Subcutaneous emphysema left chest wall again noted, improved from prior exam.  2. Right upper lobe subsegmental atelectasis. Her 3. Stable cardiomegaly.   Electronically Signed   By: Marcello Moores  Register  On: 05/21/2016 07:49   Treatments: NAME:  CEFERINO, LANG NO.:  [id number]  MEDICAL RECORD NO.:  [id number]  LOCATION:                                 FACILITY:  PHYSICIAN:  Lanelle Bal, MD    DATE OF BIRTH:  [birth year]  DATE OF PROCEDURE:  05/11/2016 DATE OF DISCHARGE:                              OPERATIVE  REPORT   PREOPERATIVE DIAGNOSIS:  A 4-cm mass, left lower lobe, biopsy-proven non- small cell carcinoma.  POSTOPERATIVE DIAGNOSIS:  A 4-cm mass, left lower lobe, biopsy-proven non-small cell carcinoma.  SURGICAL PROCEDURE:  Bronchoscopy; left video-assisted thoracoscopy; mini thoracotomy; left lower lobectomy; wedge resection, left upper lobe; lymph node dissection; placement of On-Q.  SURGEON:  Lanelle Bal, MD  FIRST ASSISTANT:  Nicholes Rough, Pa.   Discharge Exam: Blood pressure 125/67, pulse 67, temperature 98.4 F (36.9 C), temperature source Oral, resp. rate 18, height '6\' 3"'$  (1.905 m), weight 61.9 kg (136 lb 6.4 oz), SpO2 98 %.   General: Well appearing, alert and oriented Breath sounds: CTAB Heart sounds: Regular rate and rhythm. No murmur gallop or rub Incisions: Clean, dry and intact Abdominal: Soft and nontender Extremities: Warm without edema  Disposition: 01-Home or Self Care  Discharge Instructions    Discharge patient    Complete by:  As directed    Discharge disposition:  01-Home or Self Care   Discharge patient date:  05/24/2016     Allergies as of 05/24/2016      Reactions   Tramadol Nausea And Vomiting      Medication List    TAKE these medications   oxyCODONE 5 MG immediate release tablet Commonly known as:  Oxy IR/ROXICODONE Take 1 tablet (5 mg total) by mouth every 6 (six) hours as needed for severe pain.      Follow-up Jeannette, MD. Call in 1 day.   Specialty:  Cardiology Why:  PLEASE CALL Contact information: [place] [address] [phone]        [name], MD Follow up.   Specialty:  Cardiothoracic Surgery Why:  Appointment is on 06/14/2016 at 3:00pm. Please arrive at [facility] at 2:30pm for a chest xray. They are located on the first floor of our building. Contact information: [address] [place] [address] [phone]         nursing. Call in 1 day(s).   Contact information: please call our office and scheule a suture removal appointment for next week            Signed: Elgie Collard 05/25/2016, 4:28 PM

## 2016-05-22 ENCOUNTER — Inpatient Hospital Stay (HOSPITAL_COMMUNITY): Payer: BLUE CROSS/BLUE SHIELD

## 2016-05-22 NOTE — Progress Notes (Addendum)
      [address]       [place] [address]             [phone]     CARDIOTHORACIC SURGERY PROGRESS NOTE  4 Days Post-Op  S/P Procedure(s) (LRB): VIDEO BRONCHOSCOPY WITH BRONCHIAL WASHING (N/A) VIDEO ASSISTED THORACOSCOPY WITH REMOVAL OF LEFT APICAL BLEB (Left)  Subjective: No complaints. Had just returned from getting CXR.  Objective: Vital signs in last 24 hours: Temp:  [98.1 F (36.7 C)-98.2 F (36.8 C)] 98.1 F (36.7 C) (01/30 0456) Pulse Rate:  [60-77] 60 (01/30 0456) Cardiac Rhythm: Normal sinus rhythm (01/29 1930) Resp:  [18] 18 (01/30 0456) BP: (132-136)/(65-73) 132/73 (01/30 0456) SpO2:  [99 %-100 %] 100 % (01/30 0456)  Physical Exam:  General:   Well appearing, alert and oriented  Breath sounds: CTAB  Heart sounds:  Regular rate and rhythm. No murmurs, gallop, or rub.   Incisions:  Dressings clean, dry and intact  Abdomen:  Soft and nontender  Extremities:  Warm, no edema   Intake/Output from previous day: 01/29 0701 - 01/30 0700 In: 500 [P.O.:480; I.V.:20] Out: -  Intake/Output this shift: No intake/output data recorded.  Lab Results:  Recent Labs  05/20/16 0400  WBC 9.7  HGB 10.1*  HCT 30.3*  PLT 369   BMET:  Recent Labs  05/20/16 0400  NA 132*  K 3.8  CL 95*  CO2 28  GLUCOSE 101*  BUN 9  CREATININE 0.63  CALCIUM 8.8*    CBG (last 3)  No results for input(s): GLUCAP in the last 72 hours. PT/INR:  No results for input(s): LABPROT, INR in the last 72 hours.   Assessment/Plan: S/P Procedure(s) (LRB): VIDEO BRONCHOSCOPY WITH BRONCHIAL WASHING (N/A) VIDEO ASSISTED THORACOSCOPY WITH REMOVAL OF LEFT APICAL BLEB (Left)  1 CT - no air leak, CXR pending - if CXR looks ok, D/C chest tube 2 CV - hemodynamically stable 3 Dispo - will need repeat CXR in AM if CT removed. Could possibly go home tomorrow.  William Re, PA-S 05/22/2016 7:47 AM  Dg Chest 2 View  Result Date: 05/22/2016 CLINICAL DATA:  Shortness of  breath, chest tube remain EXAM: CHEST  2 VIEW COMPARISON:  Portable chest x-ray of 05/21/2016 and 05/20/2016 FINDINGS: There has been no change in a left apical pneumothorax with left chest tube remaining. On the lateral view there is a small left posterior hydropneumothorax which appears loculated. Also there is opacity medially at the left lung base overlying the heart shadow which appears to be anteriorly positioned lateral view possibly postop in nature but attention to this area on followup chest x-ray is recommended. The lungs remain hyperaerated. Mediastinal and hilar contours are unremarkable. Probable linear scarring in the right upper medial hemithorax is unchanged heart size is stable. IMPRESSION: 1. Stable left apical pneumothorax with left chest tube remaining. Small loculated hydropneumothorax noted on the lateral view posteriorly. 2. Vague opacity medially at the left lung base possibly anteriorly positioned on lateral view most likely postop. Recommend attention to this area on followup chest x-ray. 3. Hyperaeration. Electronically Signed   By: Ivar Drape M.D.   On: 05/22/2016 08:43    Plan repeat chest xray in am and likely remove tube  I have seen and examined William Kales [name] and agree with the above assessment  and plan.  [name] MD Beeper [phone] Office [phone] 05/22/2016 8:05 PM

## 2016-05-23 ENCOUNTER — Encounter: Payer: Self-pay | Admitting: *Deleted

## 2016-05-23 ENCOUNTER — Inpatient Hospital Stay (HOSPITAL_COMMUNITY): Payer: BLUE CROSS/BLUE SHIELD

## 2016-05-23 DIAGNOSIS — C3432 Malignant neoplasm of lower lobe, left bronchus or lung: Secondary | ICD-10-CM

## 2016-05-23 NOTE — Progress Notes (Signed)
      [address]       [place] [address]             [phone]     CARDIOTHORACIC SURGERY PROGRESS NOTE  5 Days Post-Op  S/P Procedure(s) (LRB): VIDEO BRONCHOSCOPY WITH BRONCHIAL WASHING (N/A) VIDEO ASSISTED THORACOSCOPY WITH REMOVAL OF LEFT APICAL BLEB (Left)  Subjective: Complaining of pain with movement on the left side. Denies SOB, difficulty walking. Still wearing nasal cannula in room, patient does not wear oxygen at home.   Objective: Vital signs in last 24 hours: Temp:  [98 F (36.7 C)-98.1 F (36.7 C)] 98 F (36.7 C) (01/31 0435) Pulse Rate:  [69-80] 69 (01/31 0435) Cardiac Rhythm: Normal sinus rhythm (01/30 2016) Resp:  [18] 18 (01/31 0435) BP: (120)/(65) 120/65 (01/31 0435) SpO2:  [95 %-99 %] 99 % (01/31 0435)  Physical Exam:  General:   Well appearing, alert and oriented  Breath sounds: CTAB  Heart sounds:  Regular rate and rhythm. No murmur, gallop or rub  Incisions:  Clean, dry and intact  Abdomen:  Soft and nontender  Extremities:  Warm and no edema   Intake/Output from previous day: 01/30 0701 - 01/31 0700 In: 240 [P.O.:240] Out: -  Intake/Output this shift: No intake/output data recorded.  Lab Results: No results for input(s): WBC, HGB, HCT, PLT in the last 72 hours. BMET: No results for input(s): NA, K, CL, CO2, GLUCOSE, BUN, CREATININE, CALCIUM in the last 72 hours.  CBG (last 3)  No results for input(s): GLUCAP in the last 72 hours. PT/INR:  No results for input(s): LABPROT, INR in the last 72 hours.  CXR:   CHEST  2 VIEW  COMPARISON:  05/22/2016  FINDINGS: Cardiac shadow is stable. Left-sided chest tube remains in place. Small apical pneumothorax is again identified and stable. No new focal infiltrate is seen. Minimal amount of left pleural fluid is noted. Stable loculated hydropneumothorax is noted posteriorly. Right lung remains hyperinflated but clear.  IMPRESSION: Stable appearance of the chest when  compared with the previous day. No new focal abnormality is noted.   Electronically Signed   By: Inez Catalina M.D.   On: 05/23/2016 07:45  Assessment/Plan: S/P Procedure(s) (LRB): VIDEO BRONCHOSCOPY WITH BRONCHIAL WASHING (N/A) VIDEO ASSISTED THORACOSCOPY WITH REMOVAL OF LEFT APICAL BLEB (Left)  1 CT - no air leak, CXR with stable left apical PTX and left loculated hydropneumothorax. D/C tube today 2 CV - PVCs overnight, patient asymptomatic. Hemodynamically stable 3 Pulm - encourage IS, walk patient and measure O2 sat without oxygen 4 Dispo - repeat CXR tomorrow AM, possible to go tomorrow  Brigid Re, PA-S 05/23/2016 7:38 AM  Breath sounds present after ct removed  Follow up xray in am Poss home tomorrow  I have seen and examined [name] [name] and agree with the above assessment  and plan.   Cancer Staging Malignant neoplasm of lower lobe of left lung ([place]) Staging form: Lung, AJCC 8th Edition - Clinical: Stage IIA (cT2b, cN0, cM0) - Signed by [name], MD on 05/15/2016 - Pathologic stage from 05/15/2016: Stage IIA (pT2b, pN0, cM0) - Signed by [name], MD on 05/15/2016 Patient has appointment with medical oncology    [name] MD Beeper [phone] Office [phone] 05/23/2016 7:01 PM

## 2016-05-23 NOTE — Progress Notes (Signed)
Removed patients Left Chest tube. Patient tolerated well. Suture tightened.  Occlusive dressing placed. Oxygen saturations 100% on 1L of o2 nasal cannula. Will continue to monitor.  Cyndia Bent RN

## 2016-05-23 NOTE — Progress Notes (Signed)
Oncology Nurse Navigator Documentation  Oncology Nurse Navigator Flowsheets 05/23/2016  Navigator Location [facility]  Referral date to RadOnc/MedOnc 06/06/2016  Navigator Encounter Type Other/Dr. Everrett Coombe office called with referral on [name].  I gave appt to [name] and she will update wife.    Treatment Phase Treatment  Barriers/Navigation Needs Coordination of Care  Interventions Coordination of Care  Coordination of Care Appts  Acuity Level 2  Acuity Level 2 Assistance expediting appointments  Time Spent with Patient 15

## 2016-05-24 ENCOUNTER — Inpatient Hospital Stay (HOSPITAL_COMMUNITY): Payer: BLUE CROSS/BLUE SHIELD

## 2016-05-24 MED ORDER — OXYCODONE HCL 5 MG PO TABS: 5.0000 mg | ORAL_TABLET | Freq: Four times a day (QID) | ORAL | 0 refills | Status: DC | PRN

## 2016-05-24 NOTE — Progress Notes (Signed)
      [address]       [place] [address]             [phone]     CARDIOTHORACIC SURGERY PROGRESS NOTE  6 Days Post-Op  S/P Procedure(s) (LRB): VIDEO BRONCHOSCOPY WITH BRONCHIAL WASHING (N/A) VIDEO ASSISTED THORACOSCOPY WITH REMOVAL OF LEFT APICAL BLEB (Left)  Subjective: Still complaining of pain in left side, only exacerbated with movement and relieved with pain medication. No complaints of SOB, dizziness, or chest tightness.  Objective: Vital signs in last 24 hours: Temp:  [98.1 F (36.7 C)-98.4 F (36.9 C)] 98.4 F (36.9 C) (02/01 0527) Pulse Rate:  [67-87] 67 (02/01 0527) Cardiac Rhythm: Normal sinus rhythm (01/31 1900) Resp:  [18-19] 18 (02/01 0527) BP: (116-125)/(56-67) 125/67 (02/01 0527) SpO2:  [93 %-100 %] 97 % (02/01 0527)  Physical Exam:  General:   Well appearing, alert and oriented  Breath sounds: CTAB  Heart sounds:  Regular rate and rhythm. No murmur gallop or rub  Incisions:  Clean, dry and intact  Abdomen:  Soft and nontender  Extremities:  Warm without edema   Intake/Output from previous day: 01/31 0701 - 02/01 0700 In: 720 [P.O.:720] Out: -  Intake/Output this shift: No intake/output data recorded.  Lab Results: No results for input(s): WBC, HGB, HCT, PLT in the last 72 hours. BMET: No results for input(s): NA, K, CL, CO2, GLUCOSE, BUN, CREATININE, CALCIUM in the last 72 hours.  CBG (last 3)  No results for input(s): GLUCAP in the last 72 hours. PT/INR:  No results for input(s): LABPROT, INR in the last 72 hours.   Assessment/Plan: S/P Procedure(s) (LRB): VIDEO BRONCHOSCOPY WITH BRONCHIAL WASHING (N/A) VIDEO ASSISTED THORACOSCOPY WITH REMOVAL OF LEFT APICAL BLEB (Left)  1 Patient is hemodynamically stable 2 Patient is off nasal cannula and O2 sat good. CT removed yesterday, CXR for this AM not yet done. 3 If CXR looks stable, patient will be discharged to home today.  Brigid Re, PA-S 05/24/2016 7:37 AM

## 2016-05-24 NOTE — Care Management Note (Addendum)
Case Management Note Marvetta Gibbons RN, BSN Unit 2W-Case Manager [phone]  Patient Details  Name: William GARRIGA [name] MRN: [id number] Date of Birth: [birth year]  Subjective/Objective:  Pt admitted s/p lobectomy/VATS   -- tx from ICU to 2W on 05/20/16               Action/Plan: PTA pt lived at home with wife- wife plans to take FMLA when pt discharged. No CM needs noted for discharge.  Expected Discharge Date:  05/24/16               Expected Discharge Plan:  [place]  In-House Referral:     Discharge planning Services  CM Consult  Post Acute Care Choice:  NA Choice offered to:  NA  DME Arranged:    DME Agency:     HH Arranged:    [place] Agency:     Status of Service:  Completed, signed off  If discussed at [place] of Stay Meetings, dates discussed:    Discharge Disposition: home/self care   Additional Comments:  Dawayne Patricia, RN 05/24/2016, 11:28 AM

## 2016-05-24 NOTE — Progress Notes (Signed)
Patient given AVS, follow-up appointments, discharge instructions, paper  Prescriptions and  patient verbalized understanding. IV and tele dcd will discharge home as ordered. Garrie Elenes, Bettina Gavia RN

## 2016-05-29 ENCOUNTER — Telehealth (HOSPITAL_COMMUNITY): Payer: Self-pay | Admitting: Physician Assistant

## 2016-05-29 ENCOUNTER — Other Ambulatory Visit (HOSPITAL_COMMUNITY): Payer: Self-pay | Admitting: *Deleted

## 2016-05-29 NOTE — Telephone Encounter (Signed)
      [address]       [place] [address]             [phone]    Warden K Biebel [name] [phone]   S/P Bronchoscopy; left video-assisted thoracoscopy; mini thoracotomy; left lower lobectomy; wedge resection, left upper lobe; lymph node dissection; placement of On-Q on 05/11/2016 with Dr. Servando Snare .  Discharged home on 05/25/2016.   Medications: Current Outpatient Prescriptions on File Prior to Visit  Medication Sig Dispense Refill  . oxyCODONE (OXY IR/ROXICODONE) 5 MG immediate release tablet Take 1 tablet (5 mg total) by mouth every 6 (six) hours as needed for severe pain. 30 tablet 0   No current facility-administered medications on file prior to visit.     Coumadin:  INR check Yes/No  Problems/Concerns: He was unsure about a suture removal appointment therefore I called our office who will call him to schedule an appointment this week to get his chest tube sutures removed.   Assessment:  Patient is doing well.  He was able to get his prescriptions filled. He is getting around just fine without issues. He knows about his follow-up appointment in a few weeks and that he will be receiving a call from our office regarding a suture removal appointment.  Contact office if concerns or problems develop.   Follow up Appointment:    [name], MD Follow up.   Specialty:  Cardiothoracic Surgery Why:  Appointment is on 06/14/2016 at 3:00pm. Please arrive at [facility] at 2:30pm for a chest xray. They are located on the first floor of our building. Contact information: [address] [place] [address] [phone]   Nicholes Rough, PA-C

## 2016-05-29 NOTE — Progress Notes (Signed)
I called [name] regarding his surgery scheduled for tomorrow.  Patient is concerned about having surgery in am, I just got out of the hospital (Bronscophy, Thoracoscopy) and haven't had  My follow up with that surgeon. Patient does not think either surgeon knows that surgery about the other's surgeon.  I told patient I would call Ryan at [facility] and give her this information to make sure it is ok with the doctor there for you to have surgery tomorrow.

## 2016-05-29 NOTE — Progress Notes (Signed)
[name] rfom Dr Everrett Coombe office called and instructed me to have patient speak to Dr Melene Plan regarding surgery for am or postpone until later date. I called and spoke with [name] and he said he will call Dr Melene Plan.

## 2016-05-31 ENCOUNTER — Encounter (INDEPENDENT_AMBULATORY_CARE_PROVIDER_SITE_OTHER): Payer: Self-pay

## 2016-05-31 DIAGNOSIS — Z902 Acquired absence of lung [part of]: Secondary | ICD-10-CM

## 2016-05-31 DIAGNOSIS — Z4802 Encounter for removal of sutures: Secondary | ICD-10-CM

## 2016-06-06 ENCOUNTER — Encounter: Payer: Self-pay | Admitting: Internal Medicine

## 2016-06-06 ENCOUNTER — Other Ambulatory Visit (HOSPITAL_BASED_OUTPATIENT_CLINIC_OR_DEPARTMENT_OTHER): Payer: No Typology Code available for payment source

## 2016-06-06 ENCOUNTER — Ambulatory Visit (HOSPITAL_BASED_OUTPATIENT_CLINIC_OR_DEPARTMENT_OTHER): Payer: No Typology Code available for payment source | Admitting: Internal Medicine

## 2016-06-06 ENCOUNTER — Encounter: Payer: Self-pay | Admitting: *Deleted

## 2016-06-06 VITALS — BP 147/71 | HR 79 | Temp 98.2°F | Resp 18 | Ht 75.0 in | Wt 141.0 lb

## 2016-06-06 DIAGNOSIS — C3412 Malignant neoplasm of upper lobe, left bronchus or lung: Secondary | ICD-10-CM

## 2016-06-06 DIAGNOSIS — Z5111 Encounter for antineoplastic chemotherapy: Secondary | ICD-10-CM | POA: Insufficient documentation

## 2016-06-06 DIAGNOSIS — Z902 Acquired absence of lung [part of]: Secondary | ICD-10-CM

## 2016-06-06 DIAGNOSIS — C3432 Malignant neoplasm of lower lobe, left bronchus or lung: Secondary | ICD-10-CM

## 2016-06-06 DIAGNOSIS — I714 Abdominal aortic aneurysm, without rupture: Secondary | ICD-10-CM | POA: Diagnosis not present

## 2016-06-06 DIAGNOSIS — M5127 Other intervertebral disc displacement, lumbosacral region: Secondary | ICD-10-CM | POA: Diagnosis not present

## 2016-06-06 HISTORY — DX: Encounter for antineoplastic chemotherapy: Z51.11

## 2016-06-06 LAB — CBC WITH DIFFERENTIAL/PLATELET
BASO%: 0.4 % (ref 0.0–2.0)
BASOS ABS: 0 10*3/uL (ref 0.0–0.1)
EOS ABS: 0.4 10*3/uL (ref 0.0–0.5)
EOS%: 5 % (ref 0.0–7.0)
HCT: 36.2 % — ABNORMAL LOW (ref 38.4–49.9)
HGB: 12 g/dL — ABNORMAL LOW (ref 13.0–17.1)
LYMPH%: 25.9 % (ref 14.0–49.0)
MCH: 30.4 pg (ref 27.2–33.4)
MCHC: 33.3 g/dL (ref 32.0–36.0)
MCV: 91.3 fL (ref 79.3–98.0)
MONO#: 0.8 10*3/uL (ref 0.1–0.9)
MONO%: 10.7 % (ref 0.0–14.0)
NEUT#: 4.5 10*3/uL (ref 1.5–6.5)
NEUT%: 58 % (ref 39.0–75.0)
Platelets: 336 10*3/uL (ref 140–400)
RBC: 3.96 10*6/uL — ABNORMAL LOW (ref 4.20–5.82)
RDW: 14.4 % (ref 11.0–14.6)
WBC: 7.8 10*3/uL (ref 4.0–10.3)
lymph#: 2 10*3/uL (ref 0.9–3.3)

## 2016-06-06 LAB — COMPREHENSIVE METABOLIC PANEL
ALK PHOS: 102 U/L (ref 40–150)
ALT: 15 U/L (ref 0–55)
AST: 18 U/L (ref 5–34)
Albumin: 3.6 g/dL (ref 3.5–5.0)
Anion Gap: 8 mEq/L (ref 3–11)
BUN: 6.4 mg/dL — AB (ref 7.0–26.0)
CALCIUM: 10.1 mg/dL (ref 8.4–10.4)
CHLORIDE: 105 meq/L (ref 98–109)
CO2: 26 mEq/L (ref 22–29)
Creatinine: 0.7 mg/dL (ref 0.7–1.3)
GLUCOSE: 76 mg/dL (ref 70–140)
POTASSIUM: 4.4 meq/L (ref 3.5–5.1)
SODIUM: 140 meq/L (ref 136–145)
Total Bilirubin: 0.39 mg/dL (ref 0.20–1.20)
Total Protein: 7.5 g/dL (ref 6.4–8.3)

## 2016-06-06 NOTE — Progress Notes (Signed)
[place] [phone]   [phone]  CONSULT NOTE  REFERRING PHYSICIAN: Dr. Lanelle Bal  REASON FOR CONSULTATION:  61 years old African-American male recently diagnosed with lung cancer.  HPI [name] is a 61 y.o. male with past medical history significant for mitral insufficiency as well as abdominal aortic aneurysm and long history of smoking. The patient was seen by his primary care physician in October 2017 complaining of cough in addition to weight loss and also for evaluation of the abdominal aortic aneurysm. CT scan of the chest, abdomen and pelvis with contrast were performed on 02/16/2016 and it showed superior segment left lower lobe lung mass measuring 3.8 x 3.2 x 3.1 cm with contacts of the left major fissure over approximately 1.7 CM span. There was also subpleural posterior left upper lobe 4 mm nodule. A PET scan on 03/07/2016 showed hypermetabolic 3.8 x 3.2 cm superior segment left lower lobe mass consistent with primary lung neoplasm. There was no mediastinal or hilar lymphadenopathy and no findings to suggest metastatic disease. The patient underwent surgical repair of the abdominal aortic aneurysm under the care of Dr. Bridgett Larsson and Dr. Scot Dock on 03/12/2016. CT-guided core biopsy of the left lower lobe lung mass by interventional radiology on 03/29/2016 was consistent with adenocarcinoma. MRI of the brain on 04/14/2016 was negative for metastatic disease to the brain. The patient was referred to Dr. Servando Snare and on 05/11/2016 he underwent bronchoscopy, left VATS with left lower lobectomy and wedge resection of the left upper lobe with lymph node dissection. The final pathology ([id number]) showed invasive moderately differentiated adenocarcinoma spanning 4.2 cm. There was lymphovascular invasion identified. There was adenocarcinoma present at the stapled resection margin and free floating carcinoma present in the prevascular tissue in  the vascular resection margin.  Dr. Servando Snare kindly referred the patient to me today for further evaluation and recommendation regarding treatment of his condition. When seen today the patient is feeling fine except for the soreness on the left side of the chest. He also has mild cough with no hemoptysis. He denied having any significant shortness of breath. He denied having any nausea, vomiting, diarrhea or constipation. He has no significant weight loss or night sweats. He has no headache or visual changes. Family history significant for mother still alive at age 57. Father died in his 64s with heart disease. [name] had cervical cancer and brother had throat cancer. The patient is married and has 4 children. He was accompanied by his wife [name]. He works as a Sports coach. He has a history of smoking 1 pack per day for around 44 years and quit 03/23/2016. He also has a history of alcohol abuse but not recently and remote history of drug abuse.  HPI  Past Medical History:  Diagnosis Date  . AAA (abdominal aortic aneurysm) ([place])   . History of hepatitis B   . HNP (herniated nucleus pulposus), lumbar    L4 with radiculopathy  . Hypertension   . Mass of left lung   . Mitral insufficiency 04/06/2016   This patient will eventually need MV repair if his prognosis is good from oncology standpoint. However, his lung cancer therapy  is the priority right now. His cardiac condition won't preclude possible lung surgery.  Once his lung cancer is under control he will need a TEE to further evaluate his mitral valve anatomy and MR severity, and also have ischemic workup as tere is evidence on calcificati  . Varicose veins of  legs     Past Surgical History:  Procedure Laterality Date  . ABDOMINAL AORTIC ENDOVASCULAR STENT GRAFT N/A 03/12/2016   Procedure: ABDOMINAL AORTIC ENDOVASCULAR STENT GRAFT;  Surgeon: Conrad Montalvin Manor, MD;  Location: [facility];  Service: Vascular;  Laterality: N/A;  . COLONOSCOPY    .  INGUINAL HERNIA REPAIR Left 1975   Left inguinal hernia  . INGUINAL HERNIA REPAIR Right   . LOBECTOMY Left 05/11/2016   Procedure: LEFT LOWER LOBE LOBECTOMY AND LEFT UPPER LOBE  RESECTION AND PLACEMENT OF ON-Q;  Surgeon: [name], MD;  Location: [facility];  Service: Thoracic;  Laterality: Left;  . LUMBAR LAMINECTOMY  October 22, 2012  . LYMPH NODE DISSECTION Left 05/11/2016   Procedure: LYMPH NODE DISSECTION;  Surgeon: [name], MD;  Location: [facility];  Service: Thoracic;  Laterality: Left;  . STAPLING OF BLEBS Left 05/11/2016   Procedure: STAPLING OF APICAL BLEB;  Surgeon: [name], MD;  Location: [facility];  Service: Thoracic;  Laterality: Left;  Marland Kitchen VIDEO ASSISTED THORACOSCOPY Left 05/18/2016   Procedure: VIDEO ASSISTED THORACOSCOPY WITH REMOVAL OF LEFT APICAL BLEB;  Surgeon: [name], MD;  Location: [facility];  Service: Thoracic;  Laterality: Left;  Marland Kitchen VIDEO ASSISTED THORACOSCOPY (VATS)/WEDGE RESECTION Left 05/11/2016   Procedure: LEFT VIDEO ASSISTED THORACOSCOPY (VATS);  Surgeon: [name], MD;  Location: [facility];  Service: Thoracic;  Laterality: Left;  Marland Kitchen VIDEO BRONCHOSCOPY N/A 05/11/2016   Procedure: VIDEO BRONCHOSCOPY, LEFT LUNG;  Surgeon: [name], MD;  Location: [facility];  Service: Thoracic;  Laterality: N/A;  . VIDEO BRONCHOSCOPY N/A 05/18/2016   Procedure: VIDEO BRONCHOSCOPY WITH BRONCHIAL WASHING;  Surgeon: [name], MD;  Location[facility];  Service: Thoracic;  Laterality: N/A;    Family History  Problem Relation Age of Onset  . Diabetes Mother     Social History Social History  Substance Use Topics  . Smoking status: Former Smoker    Packs/day: 1.00    Types: Cigarettes    Quit date: 03/05/2016  . Smokeless tobacco: Never Used  . Alcohol use 7.2 oz/week    12 Cans of beer per week     Comment: none since 02/2016    Allergies  Allergen Reactions  . Tramadol Nausea And Vomiting    Current Outpatient Prescriptions  Medication Sig  Dispense Refill  . oxyCODONE (OXY IR/ROXICODONE) 5 MG immediate release tablet Take 1 tablet (5 mg total) by mouth every 6 (six) hours as needed for severe pain. 30 tablet 0  . carvedilol (COREG) 3.125 MG tablet Take 1 tablet by mouth 2 (two) times daily.  1  . losartan (COZAAR) 25 MG tablet Take 25 mg by mouth daily.  1   No current facility-administered medications for this visit.     Review of Systems  Constitutional: negative Eyes: negative Ears, nose, mouth, throat, and face: negative Respiratory: positive for cough and pleurisy/chest pain Cardiovascular: negative Gastrointestinal: negative Genitourinary:negative Integument/breast: negative Hematologic/lymphatic: negative Musculoskeletal:negative Neurological: negative Behavioral/Psych: negative Endocrine: negative Allergic/Immunologic: negative  Physical Exam  OVF:IEPPI, healthy, no distress, anxious and malnourished SKIN: skin color, texture, turgor are normal, no rashes or significant lesions HEAD: Normocephalic, No masses, lesions, tenderness or abnormalities EYES: normal, PERRLA, Conjunctiva are pink and non-injected EARS: External ears normal, Canals clear OROPHARYNX:no exudate, no erythema and lips, buccal mucosa, and tongue normal  NECK: supple, no adenopathy, no JVD LYMPH:  no palpable lymphadenopathy, no hepatosplenomegaly LUNGS: clear to auscultation , and palpation HEART: regular  rate & rhythm, no murmurs and no gallops ABDOMEN:abdomen soft, non-tender, normal bowel sounds and no masses or organomegaly BACK: Back symmetric, no curvature., No CVA tenderness EXTREMITIES:no joint deformities, effusion, or inflammation, no edema, no skin discoloration  NEURO: alert & oriented x 3 with fluent speech, no focal motor/sensory deficits  PERFORMANCE STATUS: ECOG 1  LABORATORY DATA: Lab Results  Component Value Date   WBC 7.8 06/06/2016   HGB 12.0 (L) 06/06/2016   HCT 36.2 (L) 06/06/2016   MCV 91.3 06/06/2016     PLT 336 06/06/2016      Chemistry      Component Value Date/Time   NA 140 06/06/2016 1449   K 4.4 06/06/2016 1449   CL 95 (L) 05/20/2016 0400   CO2 26 06/06/2016 1449   BUN 6.4 (L) 06/06/2016 1449   CREATININE 0.7 06/06/2016 1449      Component Value Date/Time   CALCIUM 10.1 06/06/2016 1449   ALKPHOS 102 06/06/2016 1449   AST 18 06/06/2016 1449   ALT 15 06/06/2016 1449   BILITOT 0.39 06/06/2016 1449       RADIOGRAPHIC STUDIES: Dg Chest 2 View  Result Date: 05/24/2016 CLINICAL DATA:  Post left VA TS, left chest pain EXAM: CHEST  2 VIEW COMPARISON:  Chest x-ray of 05/23/2016. FINDINGS: The small loculated posterior left mid hemithorax hydropneumothorax is unchanged. The left chest tube has been removed. A left apical hydropneumothorax also is stable. Fracture the left seventh rib laterally is noted with a small amount of left chest wall subcutaneous air. The right lung is clear. Heart size is stable. IMPRESSION: 1. Stable left apical and left mid posterior hemithorax hydropneumothoraces after removal of left chest tube. 2. No change in aeration. 3. Left lateral seventh rib fracture. Electronically Signed   By: Ivar Drape M.D.   On: 05/24/2016 08:24   Dg Chest 2 View  Result Date: 05/23/2016 CLINICAL DATA:  Status post left thoracotomy EXAM: CHEST  2 VIEW COMPARISON:  05/22/2016 FINDINGS: Cardiac shadow is stable. Left-sided chest tube remains in place. Small apical pneumothorax is again identified and stable. No new focal infiltrate is seen. Minimal amount of left pleural fluid is noted. Stable loculated hydropneumothorax is noted posteriorly. Right lung remains hyperinflated but clear. IMPRESSION: Stable appearance of the chest when compared with the previous day. No new focal abnormality is noted. Electronically Signed   By: Inez Catalina M.D.   On: 05/23/2016 07:45   Dg Chest 2 View  Result Date: 05/22/2016 CLINICAL DATA:  Shortness of breath, chest tube remain EXAM: CHEST  2  VIEW COMPARISON:  Portable chest x-ray of 05/21/2016 and 05/20/2016 FINDINGS: There has been no change in a left apical pneumothorax with left chest tube remaining. On the lateral view there is a small left posterior hydropneumothorax which appears loculated. Also there is opacity medially at the left lung base overlying the heart shadow which appears to be anteriorly positioned lateral view possibly postop in nature but attention to this area on followup chest x-ray is recommended. The lungs remain hyperaerated. Mediastinal and hilar contours are unremarkable. Probable linear scarring in the right upper medial hemithorax is unchanged heart size is stable. IMPRESSION: 1. Stable left apical pneumothorax with left chest tube remaining. Small loculated hydropneumothorax noted on the lateral view posteriorly. 2. Vague opacity medially at the left lung base possibly anteriorly positioned on lateral view most likely postop. Recommend attention to this area on followup chest x-ray. 3. Hyperaeration. Electronically Signed   By: Eddie Dibbles  Alvester Chou M.D.   On: 05/22/2016 08:43   Dg Chest 2 View  Result Date: 05/11/2016 CLINICAL DATA:  Malignant neoplasm of left lung. EXAM: CHEST  2 VIEW COMPARISON:  Most recent chest radiograph 03/29/2016. Chest CT 02/16/2016 FINDINGS: Left perihilar mass localizes to superior segment of the lower lobe on prior chest CT, grossly unchanged in size allowing for differences in modality currently 3.7 cm. No new focal airspace disease. The lungs are hyperinflated with emphysematous change. No pleural fluid or pneumothorax. No pulmonary edema. No acute osseous abnormality. IMPRESSION: Left perihilar mass localizes to the superior segment of the left lower lobe. No acute abnormality. Electronically Signed   By: Jeb Levering M.D.   On: 05/11/2016 06:40   Dg Chest Port 1 View  Result Date: 05/21/2016 CLINICAL DATA:  Chest tube.  Shortness of breath . EXAM: PORTABLE CHEST 1 VIEW COMPARISON:   05/20/2016 . FINDINGS: Right IJ line and left chest tube in stable position. Stable small left apical pneumothorax. COPD. Right upper lobe subsegmental atelectasis. Surgical sutures noted over the left chest. Stable cardiomegaly. Left chest wall subcutaneous emphysema, improved from prior exam. IMPRESSION: 1. Postsurgical changes left lung with left chest tube in stable position. Stable small left apical pneumothorax. Subcutaneous emphysema left chest wall again noted, improved from prior exam. 2. Right upper lobe subsegmental atelectasis. Her 3. Stable cardiomegaly. Electronically Signed   By: Marcello Moores  Register   On: 05/21/2016 07:49   Dg Chest Port 1 View  Result Date: 05/20/2016 CLINICAL DATA:  Chest tube, aortic aneurysm EXAM: PORTABLE CHEST 1 VIEW COMPARISON:  05/19/2016 FINDINGS: 2 left chest tubes remain in place. Moderate left apical pneumothorax again noted, stable. Subcutaneous emphysema in the left chest wall. There is hyperinflation of the lungs compatible with COPD. Mild cardiomegaly. Left base atelectasis. IMPRESSION: Continued moderate left apical pneumothorax. COPD.  Left base atelectasis. Electronically Signed   By: Rolm Baptise M.D.   On: 05/20/2016 07:55   Dg Chest Port 1 View  Result Date: 05/19/2016 CLINICAL DATA:  Chest tube EXAM: PORTABLE CHEST 1 VIEW COMPARISON:  05/18/2016 FINDINGS: Two left chest tubes remain in place with moderate left pneumothorax and subcutaneous emphysema, not significantly changed. Hyperinflation/ COPD. Heart is mildly enlarged. No confluent airspace opacities. Right central line is unchanged. IMPRESSION: Two left chest tubes remain in place with moderate left apical pneumothorax, stable. COPD. Electronically Signed   By: Rolm Baptise M.D.   On: 05/19/2016 07:30   Dg Chest Port 1 View  Result Date: 05/18/2016 CLINICAL DATA:  Followup left pneumothorax EXAM: PORTABLE CHEST 1 VIEW COMPARISON:  05/17/2016 FINDINGS: Left pneumothorax is again identified and  appears slightly increased particularly along the lateral aspect of the left lung. Chest tubes are again seen on the left. Right jugular central line is noted. The right lung remains clear. The cardiac shadow is mildly enlarged but stable. IMPRESSION: Slight increase in left pneumothorax along the lateral margin of the left lung. These results will be called to the ordering clinician or representative by the Radiologist Assistant, and communication documented in the PACS or zVision Dashboard. Electronically Signed   By: Inez Catalina M.D.   On: 05/18/2016 09:04   Dg Chest Port 1 View  Result Date: 05/17/2016 CLINICAL DATA:  Placement of chest tube for pneumothorax after left lower lobectomy EXAM: PORTABLE CHEST 1 VIEW COMPARISON:  Chest x-ray of 05/17/2017 FINDINGS: There has been a decrease in volume of the left pneumothorax with an apical component remaining, with left chest tubes present.  The right lung is clear. Right IJ central venous line tip overlies the mid upper SVC. Cardiomegaly is stable. Left chest wall subcutaneous air again is noted. IMPRESSION: 1. Decrease in size of left pneumothorax with left chest tubes present. A left apical component of the pneumothorax persists. 2. Stable position of right IJ central venous line. Electronically Signed   By: Ivar Drape M.D.   On: 05/17/2016 16:17   Dg Chest Port 1 View  Result Date: 05/17/2016 CLINICAL DATA:  Left pneumothorax, chest pain EXAM: PORTABLE CHEST 1 VIEW COMPARISON:  05/16/2016 FINDINGS: Cardiac shadow is stable. Two left thoracostomy catheters are identified. The left pneumothorax has increased in the interval from the prior exam. The right lung remains well aerated. Right jugular central line is seen. No focal infiltrate is noted. IMPRESSION: Significant enlargement of left pneumothorax These results will be called to the ordering clinician or representative by the Radiologist Assistant, and communication documented in the PACS or zVision  Dashboard. Electronically Signed   By: Inez Catalina M.D.   On: 05/17/2016 07:52   Dg Chest Port 1 View  Result Date: 05/16/2016 CLINICAL DATA:  History of left lower lobe lung carcinoma. Status post left lower lobectomy and left upper lobe resection 05/11/2016. Pneumothorax. Chest tubes in place. EXAM: PORTABLE CHEST 1 VIEW COMPARISON:  Single-view of the chest 05/15/2016 05/14/2016. FINDINGS: Right IJ catheter and 2 left chest tubes remain in place. Left apical pneumothorax is unchanged. Subcutaneous emphysema along the left chest wall is again seen. Mild basilar atelectasis is seen on the right. The lungs are emphysematous. Heart size is upper normal. IMPRESSION: No change in the left apical pneumothorax with 2 chest tubes in place. Emphysema. Electronically Signed   By: Inge Rise M.D.   On: 05/16/2016 08:22   Dg Chest Port 1 View  Result Date: 05/15/2016 CLINICAL DATA:  Chest tube present. EXAM: PORTABLE CHEST 1 VIEW COMPARISON:  Radiograph of May 14, 2016. FINDINGS: Stable cardiomediastinal silhouette. Right internal jugular catheter is unchanged with distal tip in expected position of the SVC. Mild right basilar subsegmental atelectasis or scarring is noted. Two left-sided chest tubes are unchanged in position. Moderate left apical pneumothorax is noted which is not significantly changed compared to prior exam. Stable subcutaneous emphysema is seen over left lateral chest wall. IMPRESSION: Stable position of 2 left-sided chest tubes. Stable moderate size left apical pneumothorax is noted. Stable subcutaneous emphysema seen over left lateral chest wall. Mild right basilar subsegmental atelectasis is noted. Electronically Signed   By: Marijo Conception, M.D.   On: 05/15/2016 07:34   Dg Chest Port 1 View  Result Date: 05/14/2016 CLINICAL DATA:  Status post partial left lobectomy EXAM: PORTABLE CHEST 1 VIEW COMPARISON:  Portable chest x-ray of May 13, 2016. FINDINGS: Persistent left-sided  pneumothorax. The 2 left chest tubes are stable. There is no significant mediastinal shift. There is persistent subcutaneous air in the left axillary region. Left perihilar soft tissue prominence persists. The right lung is well-expanded. Both lungs reveal persistent interstitial prominence. The cardiac silhouette is mildly enlarged. The pulmonary vascularity is normal. The right internal jugular venous catheter tip projects over the midportion of the SVC. IMPRESSION: Stable left-sided pneumothorax. The left-sided chest tubes are in stable position. Persistent interstitial prominence bilaterally with an area of patchy confluence in the left perihilar region unchanged from yesterday's study. Electronically Signed   By: David  Martinique M.D.   On: 05/14/2016 07:40   Dg Chest Port 1 View  Result Date:  05/13/2016 CLINICAL DATA:  Pneumothorax.  Chest tube. EXAM: PORTABLE CHEST 1 VIEW COMPARISON:  May 12, 2016 FINDINGS: The right IJ and 2 left-sided chest tubes are in stable position. The left-sided pneumothorax measuring 5 cm at the apex is not significantly changed. No right-sided pneumothorax. The cardiomediastinal silhouette is stable. The right lung is clear. Left perihilar and left basilar infiltrate/ opacities remain. Subcutaneous air is seen in the left chest wall, new in the interval. IMPRESSION: 1. The 2 left chest tubes remain in place. The left-sided pneumothorax is stable. 2. Air in the left chest wall subcutaneous tissues is new in the interval. Recommend clinical correlation. 3. Persistent left perihilar and left basilar opacities/infiltrates. The left basilar infiltrate is stable while the left perihilar infiltrate is more prominent. Electronically Signed   By: Dorise Bullion III M.D   On: 05/13/2016 07:45   Dg Chest Port 1 View  Result Date: 05/12/2016 CLINICAL DATA:  Status post partial lobectomy. EXAM: PORTABLE CHEST 1 VIEW COMPARISON:  May 11, 2016 FINDINGS: Two left-sided chest tubes  remain in place. There is a right sided pneumothorax measuring 5.6 cm at the apex today versus 7.5 cm yesterday, mildly improved. No right-sided pneumothorax. The cardiomediastinal silhouette is stable. There is mild increased perihilar opacities bilaterally suggesting mild edema. Mildly more focal opacity in the left base could represent early infiltrate or atelectasis. IMPRESSION: 1. Stable left chest tubes with persistent left apical pneumothorax, mildly improved in the interval. 2. Suggested mild pulmonary venous congestion/edema. 3. Mildly more focal opacity in left base could represent atelectasis or early infiltrate. Electronically Signed   By: Dorise Bullion III M.D   On: 05/12/2016 08:11   Dg Chest Port 1 View  Result Date: 05/11/2016 CLINICAL DATA:  Status post partial lobectomy of lung EXAM: PORTABLE CHEST 1 VIEW COMPARISON:  Preoperative study from 05/11/2016 at 6:29 a.m. FINDINGS: Two left-sided chest tubes are noted. Postoperative subcutaneous emphysema is seen along the periphery of the left hemithorax. Pneumothorax at the left lung apex measures up to 7.5 cm in thickness extending to the posterior left fifth rib level and to the tip of the more medial of two left sided chest tubes. The more lateral chest tube is seen with tip projecting up to the left sixth rib. Small lateral pneumothorax is seen measuring up to 2.4 cm in thickness at the left lung base. There is slight deviation of the trachea towards the left lung from volume loss. Right IJ central line catheter is noted projecting in the region of the proximal SVC. IMPRESSION: Status post partial lobectomy of the left lung with left-sided pneumothoraces measuring 2.4 cm in thickness along the periphery of the left lung base and larger left apical pneumothorax measuring up to 7.5 cm in thickness. Right IJ central line catheter projects over the expected location of the proximal SVC. No pneumothorax on the right. Critical Value/emergent results  were called by telephone at the time of interpretation on 05/11/2016 at 5:53 pm to Dr. Lanelle Bal , who verbally acknowledged these results. Electronically Signed   By: Ashley Royalty M.D.   On: 05/11/2016 17:54    ASSESSMENT: This is a very pleasant 61 years old African-American male recently diagnosed with a stage Ib (T2a, N0, M0) non-small cell lung cancer, adenocarcinoma status post left lower lobectomy as well as wedge resection of the left upper lobe on 05/11/2016 with positive vascular resection margin.   PLAN: I had a lengthy discussion with the patient and his wife today about  his current disease stage, prognosis and treatment options. I explained to the patient that the 5 year survival for patient with a stage Ib non-small cell lung cancer is around 60%. I also explained to the patient that adjuvant systemic chemotherapy may increase the overall 5 year survival by around 5-10 %.  I gave the patient the option of consideration of adjuvant systemic chemotherapy with cisplatin 75 MG/M2 and Alimta 500 MG/M2 every3  weeks for 4 cycles versus consideration of routine observation and close monitoring. I also discussed with the patient enrollment in the [facility] [id number] clinical trial for molecular study of his resected tumor. He agreed to see the research nurse for discussion of the trial. He is also still thinking about the adjuvant systemic chemotherapy and he will let us know in the next 2-3 weeks of his decision. For the close surgical resection margin, the patient may benefit from seeing Dr. Sondra Come again to see if he will benefit from any adjuvant radiotherapy to this area. He was advised to call immediately if he has any concerning symptoms in the interval. The patient voices understanding of current disease status and treatment options and is in agreement with the current care plan.  All questions were answered. The patient knows to call the clinic with any problems, questions or concerns.  We can certainly see the patient much sooner if necessary.  Thank you so much for allowing me to participate in the care of William Kales [name]. I will continue to follow up the patient with you and assist in his care.  I spent 40 minutes counseling the patient face to face. The total time spent in the appointment was 60 minutes.  Disclaimer: This note was dictated with voice recognition software. Similar sounding words can inadvertently be transcribed and may not be corrected upon review.   Izabelle Daus K. June 06, 2016, 5:09 PM

## 2016-06-07 ENCOUNTER — Encounter: Payer: Self-pay | Admitting: *Deleted

## 2016-06-07 ENCOUNTER — Encounter: Payer: Self-pay | Admitting: Radiation Oncology

## 2016-06-07 DIAGNOSIS — Z736 Limitation of activities due to disability: Secondary | ICD-10-CM

## 2016-06-07 NOTE — Progress Notes (Signed)
Oncology Nurse Navigator Documentation  Oncology Nurse Navigator Flowsheets 06/07/2016  Navigator Location [facility]  Navigator Encounter Type Clinic/MDC/I spoke with patient and his wife today.  Dr. Julien Nordmann discussed treatment options.  He states he will think about next steps.  I gave and explained information on chemotherapy.    Abnormal Finding Date 02/16/2016  Confirmed Diagnosis Date 03/29/2016  Surgery Date 05/11/2016  Treatment Phase Treatment  Barriers/Navigation Needs Education  Education Other  Interventions Education  Education Method Verbal;Written  Acuity Level 2  Acuity Level 2 Educational needs  Time Spent with Patient 64

## 2016-06-08 ENCOUNTER — Telehealth: Payer: Self-pay | Admitting: *Deleted

## 2016-06-08 NOTE — Telephone Encounter (Signed)
Oncology Nurse Navigator Documentation  Oncology Nurse Navigator Flowsheets 06/08/2016  Navigator Location [facility]  Navigator Encounter Type Telephone/I called [name] to follow up with him regarding if he would like to have chemotherapy treatment.  He states he would like to speak with Dr. Servando Snare about it.  He has an appt with Dr. Servando Snare on 06/14/16 and he stated he will call Dr. Worthy Flank office after that appt with an update.   Treatment Phase Treatment  Barriers/Navigation Needs Education  Education Other  Interventions Education  Education Method Verbal  Acuity Level 1  Time Spent with Patient 15

## 2016-06-10 ENCOUNTER — Telehealth: Payer: Self-pay

## 2016-06-10 NOTE — Telephone Encounter (Signed)
Spoke with patient and he is aware of his appt  [name]

## 2016-06-13 ENCOUNTER — Other Ambulatory Visit: Payer: Self-pay | Admitting: Cardiothoracic Surgery

## 2016-06-13 DIAGNOSIS — C3490 Malignant neoplasm of unspecified part of unspecified bronchus or lung: Secondary | ICD-10-CM

## 2016-06-14 ENCOUNTER — Ambulatory Visit
Admission: RE | Admit: 2016-06-14 | Discharge: 2016-06-14 | Disposition: A | Discharge status: Home / Self Care | Payer: BLUE CROSS/BLUE SHIELD | Source: Ambulatory Visit | Attending: Cardiothoracic Surgery | Admitting: Cardiothoracic Surgery

## 2016-06-14 ENCOUNTER — Ambulatory Visit (INDEPENDENT_AMBULATORY_CARE_PROVIDER_SITE_OTHER): Payer: Self-pay | Admitting: Cardiothoracic Surgery

## 2016-06-14 ENCOUNTER — Encounter: Payer: Self-pay | Admitting: Cardiothoracic Surgery

## 2016-06-14 VITALS — BP 134/82 | HR 92 | Resp 16 | Ht 75.0 in | Wt 146.5 lb

## 2016-06-14 DIAGNOSIS — C3432 Malignant neoplasm of lower lobe, left bronchus or lung: Secondary | ICD-10-CM

## 2016-06-14 DIAGNOSIS — J439 Emphysema, unspecified: Secondary | ICD-10-CM

## 2016-06-14 DIAGNOSIS — J948 Other specified pleural conditions: Secondary | ICD-10-CM | POA: Diagnosis not present

## 2016-06-14 DIAGNOSIS — Z902 Acquired absence of lung [part of]: Secondary | ICD-10-CM

## 2016-06-14 DIAGNOSIS — C3490 Malignant neoplasm of unspecified part of unspecified bronchus or lung: Secondary | ICD-10-CM

## 2016-06-14 NOTE — Progress Notes (Signed)
[name]       [place] [address]             [id number]      William K Sarkisyan [name] [place] Medical Record #[id number] Date of Birth: May 16, 1955  Referring: [name], MD Primary Care: [name], MD  Chief Complaint:   POST OP FOLLOW UP 05/18/2016 DATE OF DISCHARGE: OPERATIVE REPOR PREOPERATIVE DIAGNOSIS:  Persistent air leak after left lower lobectomy and wedge resection of left upper lobe for stage IB adenocarcinoma of the lung. POSTOPERATIVE DIAGNOSIS:  Persistent air leak after left lower lobectomy and wedge resection of left upper lobe for stage IB adenocarcinoma of the lung. SURGICAL PROCEDURE:  Repeat left video-assisted thoracoscopy and stapling and excision of apical blebs. SURGEON:  Lanelle Bal, MD 05/11/2016 OPERATIVE REPOR PREOPERATIVE DIAGNOSIS:  A 4-cm mass, left lower lobe, biopsy-proven non- small cell carcinoma. POSTOPERATIVE DIAGNOSIS:  A 4-cm mass, left lower lobe, biopsy-proven non-small cell carcinoma. SURGICAL PROCEDURE:  Bronchoscopy; left video-assisted thoracoscopy; mini thoracotomy; left lower lobectomy; wedge resection, left upper lobe; lymph node dissection; placement of On-Q. SURGEON:  Lanelle Bal, MD  Cancer Staging Malignant neoplasm of lower lobe of left lung ([place]) Staging form: Lung, AJCC 8th Edition - Clinical: Stage IIA (cT2b, cN0, cM0) - Signed by [name], MD on 05/15/2016 - Pathologic stage from 05/15/2016: Stage IIA (pT2b, pN0, cM0) - Signed by [name], MD on 05/15/2016  History of Present Illness:     Patient returns to the office stay in follow-up after his recent left lower lobectomy and wedge resection of the left upper lobe for a stage II a moderately differentiated adenocarcinoma of the lung. The patient had a protracted postop course when the had persistent air leak from her ruptured bleb of the upper lobe and required repeat surgery. He comes in today feeling  well without symptoms of shortness of breath or congestive heart failure. He notes that he has not been smoking since the time of surgery and feels much better because of it.  He has been seen in oncology clinic for consideration of postoperative chemotherapy.    Past Medical History:  Diagnosis Date  . AAA (abdominal aortic aneurysm) ([place])   . Encounter for antineoplastic chemotherapy 06/06/2016  . History of hepatitis B   . HNP (herniated nucleus pulposus), lumbar    L4 with radiculopathy  . Hypertension   . Mass of left lung   . Mitral insufficiency 04/06/2016     . Varicose veins of legs      History  Smoking Status  . Former Smoker  . Packs/day: 1.00  . Types: Cigarettes  . Quit date: 03/05/2016  Smokeless Tobacco  . Never Used    History  Alcohol Use  . 7.2 oz/week  . 12 Cans of beer per week    Comment: none since 02/2016     Allergies  Allergen Reactions  . Tramadol Nausea And Vomiting    Current Outpatient Prescriptions  Medication Sig Dispense Refill  . carvedilol (COREG) 3.125 MG tablet Take 1 tablet by mouth 2 (two) times daily.  1  . losartan (COZAAR) 25 MG tablet Take 25 mg by mouth daily.  1  . oxyCODONE (OXY IR/ROXICODONE) 5 MG immediate release tablet Take 1 tablet (5 mg total) by mouth every 6 (six) hours as needed for severe pain. (Patient not taking: Reported on 06/14/2016) 30 tablet 0   No current facility-administered medications for this visit.  Physical Exam: BP 134/82 (BP Location: Right Arm, Patient Position: Sitting, Cuff Size: Normal)   Pulse 92   Resp 16   Ht '6\' 3"'$  (1.905 m)   Wt 146 lb 8 oz (66.5 kg)   SpO2 96% Comment: ON RA  BMI 18.31 kg/m   General appearance: alert, cooperative and appears older than stated age Neurologic: intact Heart: systolic murmur: holosystolic 3/6, crescendo radiates to back Lungs: clear to auscultation bilaterally Abdomen: soft, non-tender; bowel sounds normal; no masses,  no  organomegaly Extremities: extremities normal, atraumatic, no cyanosis or edema and no edema, redness or tenderness in the calves or thighs Wound: Patient's left chest incisions are healing well   Diagnostic Studies & Laboratory data:     Recent Radiology Findings:   Dg Chest 2 View  Result Date: 06/14/2016 CLINICAL DATA:  History of lung surgery. EXAM: CHEST  2 VIEW COMPARISON:  05/24/2016 . FINDINGS: Postsurgical changes left lung. Improving left apical hydropneumothorax. Mild residual. Pleural-parenchymal thickening on the left noted consistent with scarring. Stable cardiomegaly. Stable posterolateral seventh rib fracture again noted. IMPRESSION: 1. Postsurgical changes left lung with improving left apical hydropneumothorax. Mild residual. 2. Stable cardiomegaly. Electronically Signed   By: Marcello Moores  Register   On: 06/14/2016 14:38      Recent Lab Findings: Lab Results  Component Value Date   WBC 7.8 06/06/2016   HGB 12.0 (L) 06/06/2016   HCT 36.2 (L) 06/06/2016   PLT 336 06/06/2016   GLUCOSE 76 06/06/2016   ALT 15 06/06/2016   AST 18 06/06/2016   NA 140 06/06/2016   K 4.4 06/06/2016   CL 95 (L) 05/20/2016   CREATININE 0.7 06/06/2016   BUN 6.4 (L) 06/06/2016   CO2 26 06/06/2016   INR 1.13 05/18/2016   Diagnosis 1. Foreign body, removal w/tissue, Left Lung Needle Track - FOREIGN MATERIAL. - THERE IS NO EVIDENCE OF MALIGNANCY. 2. Lung, resection (segmental or lobe), Left Lower Lobe w/ part of Upper Lobe - INVASIVE ADENOCARCINOMA, MODERATELY DIFFERENTIATED, SPANNING 4.2 CM. - ADENOCARCINOMA IS PRESENT AT THE STAPLED RESECTION MARGIN OF SPECIMEN #2 AND VASCULAR RESECTION MARGIN OF #2. - LYMPHOVASCULAR INVASION IS IDENTIFIED. - THERE IS NO EVIDENCE OF CARCINOMA IN 2 OF 2 LYMPH NODES (0/2). - SEE ONCOLOGY TABLE BELOW. 3. Lung, wedge biopsy/resection, Frozen section additional Margin (from LLL and part of LUL) - BENIGN LUNG PARENCHYMA WITH INTRA-ALVEOLAR PIGMENT LADEN  MACROPHAGES. - THERE IS NO EVIDENCE OF MALIGNANCY. 4. Lymph node, biopsy, 11L - THERE IS NO EVIDENCE OF CARCINOMA IN 1 OF 1 LYMPH NODE (0/1). 5. Lymph node, biopsy, 9L - THERE IS NO EVIDENCE OF CARCINOMA IN 1 OF 1 LYMPH NODE (0/1). 6. Lymph node, biopsy, 8 - THERE IS NO EVIDENCE OF CARCINOMA IN 1 OF 1 LYMPH NODE (0/1). 7. Lymph node, biopsy, 8 #2 - THERE IS NO EVIDENCE OF CARCINOMA IN 1 OF 1 LYMPH NODE (0/1). 8. Lymph node, biopsy, 10L - THERE IS NO EVIDENCE OF CARCINOMA IN 1 OF 1 LYMPH NODE (0/1). 9. Lymph node, biopsy, 4L - THERE IS NO EVIDENCE OF CARCINOMA IN 1 OF 1 LYMPH NODE (0/1). 10. Lymph node, biopsy, 4L #2 - THERE IS NO EVIDENCE OF CARCINOMA IN 1 OF 1 LYMPH NODE (0/1). 11. Lymph node, biopsy, 2L - THERE IS NO EVIDENCE OF CARCINOMA IN 1 OF 1 LYMPH NODE (0/1). 12. Lymph node, biopsy, 11L #2 - THERE IS NO EVIDENCE OF CARCINOMA IN 1 OF 1 LYMPH NODE (0/1). 13. Lymph node, biopsy, 12L - THERE  IS NO EVIDENCE OF CARCINOMA IN 1 OF 1 LYMPH NODE (0/ 12. Lymph node, biopsy, 11L #2 - THERE IS NO EVIDENCE OF CARCINOMA IN 1 OF 1 LYMPH NODE (0/1). 13. Lymph node, biopsy, 12L - THERE IS NO EVIDENCE OF CARCINOMA IN 1 OF 1 LYMPH NODE (0/1). 14. Lung, bleb(s) / bullae, apical - BENIGN LUNG WITH BULLAE FORMATION. - THERE IS NO EVIDENCE OF MALIGNANCY. Microscopic Comment 2. LUNG Specimen, including laterality: Left lung Procedure: LUL and LLL resections(s) and additional parenchymal margin resection with multiple lymph node resections Specimen integrity (intact/disrupted): Intact Tumor site: Lateral portion of specimen #2 Tumor focality: Unifocal Maximum tumor size (cm): At least 4.2 cm Histologic type: Invasive adenocarcinoma Grade: Moderately differentiated Margins: Adenocarcinoma is present at the stapled resection margin of specimen #2 and free floating carcinoma is present in perivascular tissue in the vascular resection margins of specimen #2. Visceral pleura invasion: Not  definitively identified Tumor extension: Confined to lung parenchyma Treatment effect (if treated with neoadjuvant therapy): N/A Lymph -Vascular invasion: Present Lymph nodes: Number examined - 12; Number N1 nodes positive 0; Number N2 nodes positive 0 TNM code: pT2a, pN0 Ancillary studies: Can be performed upon clinician request Non-neoplastic lung: Emphysematous changes and intra-alveolar macrophages. Comments: There is microscopic tumor present in perivascular areas from tissue submitted from the vascular resection margins of specimen #2. (JBK:kh 05/15/16)  Assessment / Plan:    Patient doing well following recent left lower lobectomy and wedge resection of the left upper lobe for a T2 and no stage IIa moderately differentiated adeno carcinoma, the original resection margin of the upper lobe specimen #2 was positive, additional margin was obtained from the left upper lobe and labeled specimen #3 and is negative. Patient's to return to see medical oncology March 6 for consideration of postoperative chemotherapy treatment.   Plan see the patient back in one month and follow-up   [name] MD      [address] [place] [address] Office [phone]   Beeper [phone]  06/14/2016 3:25 PM

## 2016-06-15 ENCOUNTER — Ambulatory Visit: Payer: BLUE CROSS/BLUE SHIELD | Admitting: Cardiology

## 2016-06-20 ENCOUNTER — Encounter: Payer: Self-pay | Admitting: Cardiology

## 2016-06-22 ENCOUNTER — Telehealth: Payer: Self-pay | Admitting: Internal Medicine

## 2016-06-22 NOTE — Telephone Encounter (Signed)
Received LT disability paperwork to be completed

## 2016-06-26 ENCOUNTER — Other Ambulatory Visit (HOSPITAL_BASED_OUTPATIENT_CLINIC_OR_DEPARTMENT_OTHER): Payer: BLUE CROSS/BLUE SHIELD

## 2016-06-26 ENCOUNTER — Telehealth: Payer: Self-pay | Admitting: Internal Medicine

## 2016-06-26 ENCOUNTER — Encounter: Payer: Self-pay | Admitting: Internal Medicine

## 2016-06-26 ENCOUNTER — Other Ambulatory Visit: Payer: Self-pay | Admitting: *Deleted

## 2016-06-26 ENCOUNTER — Other Ambulatory Visit: Payer: BLUE CROSS/BLUE SHIELD

## 2016-06-26 ENCOUNTER — Encounter: Payer: Self-pay | Admitting: *Deleted

## 2016-06-26 ENCOUNTER — Ambulatory Visit (HOSPITAL_BASED_OUTPATIENT_CLINIC_OR_DEPARTMENT_OTHER): Payer: BLUE CROSS/BLUE SHIELD | Admitting: Internal Medicine

## 2016-06-26 VITALS — BP 145/80 | HR 71 | Temp 98.0°F | Resp 18 | Ht 75.0 in | Wt 143.8 lb

## 2016-06-26 DIAGNOSIS — I10 Essential (primary) hypertension: Secondary | ICD-10-CM

## 2016-06-26 DIAGNOSIS — C3432 Malignant neoplasm of lower lobe, left bronchus or lung: Secondary | ICD-10-CM | POA: Diagnosis not present

## 2016-06-26 DIAGNOSIS — Z7189 Other specified counseling: Secondary | ICD-10-CM | POA: Insufficient documentation

## 2016-06-26 DIAGNOSIS — Z5111 Encounter for antineoplastic chemotherapy: Secondary | ICD-10-CM

## 2016-06-26 LAB — CBC WITH DIFFERENTIAL/PLATELET
BASO%: 0.4 % (ref 0.0–2.0)
Basophils Absolute: 0 10*3/uL (ref 0.0–0.1)
EOS%: 3.8 % (ref 0.0–7.0)
Eosinophils Absolute: 0.3 10*3/uL (ref 0.0–0.5)
HCT: 40.4 % (ref 38.4–49.9)
HGB: 13.4 g/dL (ref 13.0–17.1)
LYMPH%: 28.4 % (ref 14.0–49.0)
MCH: 29.9 pg (ref 27.2–33.4)
MCHC: 33.2 g/dL (ref 32.0–36.0)
MCV: 90.2 fL (ref 79.3–98.0)
MONO#: 0.6 10*3/uL (ref 0.1–0.9)
MONO%: 7.4 % (ref 0.0–14.0)
NEUT%: 60 % (ref 39.0–75.0)
NEUTROS ABS: 4.6 10*3/uL (ref 1.5–6.5)
PLATELETS: 316 10*3/uL (ref 140–400)
RBC: 4.48 10*6/uL (ref 4.20–5.82)
RDW: 14 % (ref 11.0–14.6)
WBC: 7.6 10*3/uL (ref 4.0–10.3)
lymph#: 2.2 10*3/uL (ref 0.9–3.3)

## 2016-06-26 LAB — COMPREHENSIVE METABOLIC PANEL
ALT: 26 U/L (ref 0–55)
AST: 24 U/L (ref 5–34)
Albumin: 3.9 g/dL (ref 3.5–5.0)
Alkaline Phosphatase: 113 U/L (ref 40–150)
Anion Gap: 9 mEq/L (ref 3–11)
BILIRUBIN TOTAL: 0.54 mg/dL (ref 0.20–1.20)
BUN: 6.4 mg/dL — ABNORMAL LOW (ref 7.0–26.0)
CHLORIDE: 106 meq/L (ref 98–109)
CO2: 27 meq/L (ref 22–29)
CREATININE: 0.8 mg/dL (ref 0.7–1.3)
Calcium: 10.2 mg/dL (ref 8.4–10.4)
EGFR: >90 mL/min/{1.73_m2} (ref >90)
GLUCOSE: 104 mg/dL (ref 70–140)
Potassium: 4.4 mEq/L (ref 3.5–5.1)
SODIUM: 142 meq/L (ref 136–145)
TOTAL PROTEIN: 7.5 g/dL (ref 6.4–8.3)

## 2016-06-26 MED ORDER — CYANOCOBALAMIN 1000 MCG/ML IJ SOLN
1000.0000 ug | Freq: Once | INTRAMUSCULAR | Status: AC
  Administered 2016-06-26: 1000 ug via INTRAMUSCULAR

## 2016-06-26 MED ORDER — FOLIC ACID 1 MG PO TABS: 1.0000 mg | ORAL_TABLET | Freq: Every day | ORAL | 4 refills | Status: DC

## 2016-06-26 MED ORDER — PROCHLORPERAZINE MALEATE 10 MG PO TABS: 10.0000 mg | ORAL_TABLET | Freq: Four times a day (QID) | ORAL | 0 refills | Status: DC | PRN

## 2016-06-26 MED ORDER — CYANOCOBALAMIN 1000 MCG/ML IJ SOLN: 1000.0000 ug | Freq: Once | INTRAMUSCULAR | Status: DC

## 2016-06-26 MED ORDER — CYANOCOBALAMIN 1000 MCG/ML IJ SOLN
INTRAMUSCULAR | Status: AC
  Filled 2016-06-26: qty 1

## 2016-06-26 MED ORDER — DEXAMETHASONE 4 MG PO TABS: ORAL_TABLET | ORAL | 0 refills | Status: DC

## 2016-06-26 NOTE — Telephone Encounter (Signed)
Faxed disability forms to [facility]. Gave copy to patient in the lobby today

## 2016-06-26 NOTE — Telephone Encounter (Signed)
Appointments scheduled per 3/6 LOS. Patient given AVS report and calendars with future scheduled appointments. °

## 2016-06-26 NOTE — Progress Notes (Signed)
START OFF PATHWAY REGIMEN - Non-Small Cell Lung   [id number]:Cisplatin 75 mg/m2 + Pemetrexed 500 mg/m2 q21 days:   A cycle is every 21 days:     Pemetrexed        Dose Mod: None     Cisplatin        Dose Mod: None  **Always confirm dose/schedule in your pharmacy ordering system**    Patient Characteristics: Stage IB - Resected AJCC T Category: T2a Current Disease Status: No Distant Mets or Local Recurrence AJCC N Category: N0 AJCC M Category: M0 AJCC 8 Stage Grouping: IB  Intent of Therapy: Curative Intent, Discussed with Patient

## 2016-06-26 NOTE — Progress Notes (Signed)
[place] Telephone:([phone]   Fax:([phone]  OFFICE PROGRESS NOTE  [name], MD [place] [address]  DIAGNOSIS: stage Ib (T2a, N0, M0) non-small cell lung cancer, adenocarcinoma diagnosed in December 2017.  PRIOR THERAPY: status post left lower lobectomy as well as wedge resection of the left upper lobe on 05/11/2016.  CURRENT THERAPY: Adjuvant systemic chemotherapy with cisplatin 75 MG/M2 and Alimta 500 MG/M2 every 3 weeks. First dose 07/03/2016.  INTERVAL HISTORY: William Kales [name] 61 y.o. male returns to the clinic today for follow-up visit accompanied by his wife. The patient is feeling fine today with no specific complaints. He denied having any current chest pain, shortness of breath, cough or hemoptysis. He has no fever or chills. He denied having any weight loss or night sweats. He is now interested in proceeding with the adjuvant chemotherapy and he is here for evaluation and discussion of this treatment options.  MEDICAL HISTORY: Past Medical History:  Diagnosis Date  . AAA (abdominal aortic aneurysm) ([place])   . Encounter for antineoplastic chemotherapy 06/06/2016  . History of hepatitis B   . HNP (herniated nucleus pulposus), lumbar    L4 with radiculopathy  . Hypertension   . Mass of left lung   . Mitral insufficiency 04/06/2016   This patient will eventually need MV repair if his prognosis is good from oncology standpoint. However, his lung cancer therapy  is the priority right now. His cardiac condition won't preclude possible lung surgery.  Once his lung cancer is under control he will need a TEE to further evaluate his mitral valve anatomy and MR severity, and also have ischemic workup as tere is evidence on calcificati  . Varicose veins of legs     ALLERGIES:  is allergic to tramadol.  MEDICATIONS:  Current Outpatient Prescriptions  Medication Sig Dispense Refill  . carvedilol (COREG) 3.125 MG tablet  Take 1 tablet by mouth 2 (two) times daily.  1  . losartan (COZAAR) 25 MG tablet Take 25 mg by mouth daily.  1  . oxyCODONE (OXY IR/ROXICODONE) 5 MG immediate release tablet Take 1 tablet (5 mg total) by mouth every 6 (six) hours as needed for severe pain. (Patient not taking: Reported on 06/14/2016) 30 tablet 0   No current facility-administered medications for this visit.     SURGICAL HISTORY:  Past Surgical History:  Procedure Laterality Date  . ABDOMINAL AORTIC ENDOVASCULAR STENT GRAFT N/A 03/12/2016   Procedure: ABDOMINAL AORTIC ENDOVASCULAR STENT GRAFT;  Surgeon: Conrad Murdock, MD;  Location: [facility];  Service: Vascular;  Laterality: N/A;  . COLONOSCOPY    . INGUINAL HERNIA REPAIR Left 1975   Left inguinal hernia  . INGUINAL HERNIA REPAIR Right   . LOBECTOMY Left 05/11/2016   Procedure: LEFT LOWER LOBE LOBECTOMY AND LEFT UPPER LOBE  RESECTION AND PLACEMENT OF ON-Q;  Surgeon: [name], MD;  Location: [facility];  Service: Thoracic;  Laterality: Left;  . LUMBAR LAMINECTOMY  October 22, 2012  . LYMPH NODE DISSECTION Left 05/11/2016   Procedure: LYMPH NODE DISSECTION;  Surgeon: [name], MD;  Location: [facility];  Service: Thoracic;  Laterality: Left;  . STAPLING OF BLEBS Left 05/11/2016   Procedure: STAPLING OF APICAL BLEB;  Surgeon: [name], MD;  Location: [facility];  Service: Thoracic;  Laterality: Left;  Marland Kitchen VIDEO ASSISTED THORACOSCOPY Left 05/18/2016   Procedure: VIDEO ASSISTED THORACOSCOPY WITH REMOVAL OF LEFT APICAL BLEB;  Surgeon:  [name], MD;  Location: [facility];  Service: Thoracic;  Laterality: Left;  Marland Kitchen VIDEO ASSISTED THORACOSCOPY (VATS)/WEDGE RESECTION Left 05/11/2016   Procedure: LEFT VIDEO ASSISTED THORACOSCOPY (VATS);  Surgeon: [name], MD;  Location: [facility];  Service: Thoracic;  Laterality: Left;  Marland Kitchen VIDEO BRONCHOSCOPY N/A 05/11/2016   Procedure: VIDEO BRONCHOSCOPY, LEFT LUNG;  Surgeon: [name], MD;  Location: [facility];  Service: Thoracic;   Laterality: N/A;  . VIDEO BRONCHOSCOPY N/A 05/18/2016   Procedure: VIDEO BRONCHOSCOPY WITH BRONCHIAL WASHING;  Surgeon: [name], MD;  Location: [facility];  Service: Thoracic;  Laterality: N/A;    REVIEW OF SYSTEMS:  Constitutional: positive for fatigue Eyes: negative Ears, nose, mouth, throat, and face: negative Respiratory: negative Cardiovascular: negative Gastrointestinal: negative Genitourinary:negative Integument/breast: negative Hematologic/lymphatic: negative Musculoskeletal:negative Neurological: negative Behavioral/Psych: negative Endocrine: negative Allergic/Immunologic: negative   PHYSICAL EXAMINATION: General appearance: alert, cooperative, fatigued and no distress Head: Normocephalic, without obvious abnormality, atraumatic Neck: no adenopathy, no JVD, supple, symmetrical, trachea midline and thyroid not enlarged, symmetric, no tenderness/mass/nodules Lymph nodes: Cervical, supraclavicular, and axillary nodes normal. Resp: clear to auscultation bilaterally Back: symmetric, no curvature. ROM normal. No CVA tenderness. Cardio: regular rate and rhythm, S1, S2 normal, no murmur, click, rub or gallop GI: soft, non-tender; bowel sounds normal; no masses,  no organomegaly Extremities: extremities normal, atraumatic, no cyanosis or edema Neurologic: Alert and oriented X 3, normal strength and tone. Normal symmetric reflexes. Normal coordination and gait  ECOG PERFORMANCE STATUS: 1 - Symptomatic but completely ambulatory  Blood pressure (!) 145/80, pulse 71, temperature 98 F (36.7 C), temperature source Oral, resp. rate 18, height '6\' 3"'$  (1.905 m), weight 143 lb 12.8 oz (65.2 kg), SpO2 98 %.  LABORATORY DATA: Lab Results  Component Value Date   WBC 7.6 06/26/2016   HGB 13.4 06/26/2016   HCT 40.4 06/26/2016   MCV 90.2 06/26/2016   PLT 316 06/26/2016      Chemistry      Component Value Date/Time   NA 140 06/06/2016 1449   K 4.4 06/06/2016 1449   CL 95 (L)  05/20/2016 0400   CO2 26 06/06/2016 1449   BUN 6.4 (L) 06/06/2016 1449   CREATININE 0.7 06/06/2016 1449      Component Value Date/Time   CALCIUM 10.1 06/06/2016 1449   ALKPHOS 102 06/06/2016 1449   AST 18 06/06/2016 1449   ALT 15 06/06/2016 1449   BILITOT 0.39 06/06/2016 1449       RADIOGRAPHIC STUDIES: Dg Chest 2 View  Result Date: 06/14/2016 CLINICAL DATA:  History of lung surgery. EXAM: CHEST  2 VIEW COMPARISON:  05/24/2016 . FINDINGS: Postsurgical changes left lung. Improving left apical hydropneumothorax. Mild residual. Pleural-parenchymal thickening on the left noted consistent with scarring. Stable cardiomegaly. Stable posterolateral seventh rib fracture again noted. IMPRESSION: 1. Postsurgical changes left lung with improving left apical hydropneumothorax. Mild residual. 2. Stable cardiomegaly. Electronically Signed   By: Marcello Moores  Register   On: 06/14/2016 14:38    ASSESSMENT AND PLAN: This is a very pleasant 61 years old white male with stage Ib non-small cell lung cancer, adenocarcinoma status post left lower lobectomy as well as wedge resection of the left upper lobe. The patient is here today for evaluation and discussion of the adjuvant treatment options. He is now interested in proceeding with the adjuvant treatment. I recommended for the patient treatment with cisplatin 75 MG/M2 and Alimta 500 MG/M2 every 3 weeks for a total of 4 cycles. I discussed with  the patient adverse effects of this treatment including but not limited to alopecia, myelosuppression, nausea and vomiting, peripheral neuropathy, liver or renal dysfunction. We will arrange for the patient to receive vitamin B 12 injection today. The patient would also receive prescription for Compazine 10 mg by mouth every 6 hours as needed for nausea, Decadron 4 mg by mouth twice a day, the day before, day of and day after the chemotherapy in addition to folic acid 1 mg by mouth daily. I will arrange for the patient to have  a chemotherapy education class before starting the first dose of the chemotherapy. He would come back for follow-up visit in 2 weeks for evaluation and management of any adverse effect of his treatment. For hypertension, the patient was advised to continue with his blood pressure medication. He was advised to call immediately if he has any concerning symptoms in the interval. The patient voices understanding of current disease status and treatment options and is in agreement with the current care plan.  All questions were answered. The patient knows to call the clinic with any problems, questions or concerns. We can certainly see the patient much sooner if necessary.  I spent 15 minutes counseling the patient face to face. The total time spent in the appointment was 25 minutes.  Disclaimer: This note was dictated with voice recognition software. Similar sounding words can inadvertently be transcribed and may not be corrected upon review.

## 2016-07-02 ENCOUNTER — Ambulatory Visit: Payer: BLUE CROSS/BLUE SHIELD

## 2016-07-02 ENCOUNTER — Ambulatory Visit: Payer: BLUE CROSS/BLUE SHIELD | Admitting: Radiation Oncology

## 2016-07-03 ENCOUNTER — Other Ambulatory Visit (HOSPITAL_BASED_OUTPATIENT_CLINIC_OR_DEPARTMENT_OTHER): Payer: BLUE CROSS/BLUE SHIELD

## 2016-07-03 ENCOUNTER — Ambulatory Visit (HOSPITAL_BASED_OUTPATIENT_CLINIC_OR_DEPARTMENT_OTHER): Payer: BLUE CROSS/BLUE SHIELD

## 2016-07-03 VITALS — BP 149/77 | HR 94 | Temp 97.9°F | Resp 16

## 2016-07-03 DIAGNOSIS — Z5111 Encounter for antineoplastic chemotherapy: Secondary | ICD-10-CM | POA: Diagnosis not present

## 2016-07-03 DIAGNOSIS — C3432 Malignant neoplasm of lower lobe, left bronchus or lung: Secondary | ICD-10-CM

## 2016-07-03 LAB — CBC WITH DIFFERENTIAL/PLATELET
BASO%: 0 % (ref 0.0–2.0)
Basophils Absolute: 0 10*3/uL (ref 0.0–0.1)
EOS%: 0 % (ref 0.0–7.0)
Eosinophils Absolute: 0 10*3/uL (ref 0.0–0.5)
HCT: 39.5 % (ref 38.4–49.9)
HGB: 13.2 g/dL (ref 13.0–17.1)
LYMPH%: 8.9 % — AB (ref 14.0–49.0)
MCH: 30.3 pg (ref 27.2–33.4)
MCHC: 33.4 g/dL (ref 32.0–36.0)
MCV: 90.8 fL (ref 79.3–98.0)
MONO#: 0.5 10*3/uL (ref 0.1–0.9)
MONO%: 2.9 % (ref 0.0–14.0)
NEUT#: 14.2 10*3/uL — ABNORMAL HIGH (ref 1.5–6.5)
NEUT%: 88.2 % — AB (ref 39.0–75.0)
PLATELETS: 336 10*3/uL (ref 140–400)
RBC: 4.35 10*6/uL (ref 4.20–5.82)
RDW: 14.4 % (ref 11.0–14.6)
WBC: 16.1 10*3/uL — AB (ref 4.0–10.3)
lymph#: 1.4 10*3/uL (ref 0.9–3.3)

## 2016-07-03 LAB — COMPREHENSIVE METABOLIC PANEL
ALT: 19 U/L (ref 0–55)
ANION GAP: 11 meq/L (ref 3–11)
AST: 18 U/L (ref 5–34)
Albumin: 4 g/dL (ref 3.5–5.0)
Alkaline Phosphatase: 108 U/L (ref 40–150)
BUN: 7.6 mg/dL (ref 7.0–26.0)
CHLORIDE: 105 meq/L (ref 98–109)
CO2: 21 meq/L — AB (ref 22–29)
CREATININE: 0.9 mg/dL (ref 0.7–1.3)
Calcium: 9.9 mg/dL (ref 8.4–10.4)
GLUCOSE: 269 mg/dL — AB (ref 70–140)
Potassium: 3.9 mEq/L (ref 3.5–5.1)
SODIUM: 137 meq/L (ref 136–145)
TOTAL PROTEIN: 7.6 g/dL (ref 6.4–8.3)
Total Bilirubin: 0.38 mg/dL (ref 0.20–1.20)

## 2016-07-03 LAB — MAGNESIUM: MAGNESIUM: 1.7 mg/dL (ref 1.5–2.5)

## 2016-07-03 MED ORDER — PALONOSETRON HCL INJECTION 0.25 MG/5ML
INTRAVENOUS | Status: AC
  Filled 2016-07-03: qty 5

## 2016-07-03 MED ORDER — SODIUM CHLORIDE 0.9 % IV SOLN
Freq: Once | INTRAVENOUS | Status: AC
  Administered 2016-07-03: 10:00:00 via INTRAVENOUS

## 2016-07-03 MED ORDER — POTASSIUM CHLORIDE 2 MEQ/ML IV SOLN
Freq: Once | INTRAVENOUS | Status: AC
  Administered 2016-07-03: 10:00:00 via INTRAVENOUS
  Filled 2016-07-03: qty 10

## 2016-07-03 MED ORDER — SODIUM CHLORIDE 0.9 % IV SOLN
490.0000 mg/m2 | Freq: Once | INTRAVENOUS | Status: AC
  Administered 2016-07-03: 900 mg via INTRAVENOUS
  Filled 2016-07-03: qty 20

## 2016-07-03 MED ORDER — SODIUM CHLORIDE 0.9 % IV SOLN
75.0000 mg/m2 | Freq: Once | INTRAVENOUS | Status: AC
  Administered 2016-07-03: 140 mg via INTRAVENOUS
  Filled 2016-07-03: qty 140

## 2016-07-03 MED ORDER — FOSAPREPITANT DIMEGLUMINE INJECTION 150 MG
Freq: Once | INTRAVENOUS | Status: AC
  Administered 2016-07-03: 12:00:00 via INTRAVENOUS
  Filled 2016-07-03: qty 5

## 2016-07-03 MED ORDER — PALONOSETRON HCL INJECTION 0.25 MG/5ML
0.2500 mg | Freq: Once | INTRAVENOUS | Status: AC
  Administered 2016-07-03: 0.25 mg via INTRAVENOUS

## 2016-07-03 NOTE — Patient Instructions (Signed)
[facility] Discharge Instructions for Patients Receiving Chemotherapy  Today you received the following chemotherapy agents pemetrexed (Alimta) and carboplatin (Paraplatin).  To help prevent nausea and vomiting after your treatment, we encourage you to take your nausea medication as directed by your MD.   If you develop nausea and vomiting that is not controlled by your nausea medication, call the clinic.   BELOW ARE SYMPTOMS THAT SHOULD BE REPORTED IMMEDIATELY:  *FEVER GREATER THAN 100.5 F  *CHILLS WITH OR WITHOUT FEVER  NAUSEA AND VOMITING THAT IS NOT CONTROLLED WITH YOUR NAUSEA MEDICATION  *UNUSUAL SHORTNESS OF BREATH  *UNUSUAL BRUISING OR BLEEDING  TENDERNESS IN MOUTH AND THROAT WITH OR WITHOUT PRESENCE OF ULCERS  *URINARY PROBLEMS  *BOWEL PROBLEMS  UNUSUAL RASH Items with * indicate a potential emergency and should be followed up as soon as possible.  Feel free to call the clinic you have any questions or concerns. The clinic phone number is [phone].  Please show the [facility] at check-in to the Emergency Department and triage nurse.  Palonosetron Injection What is this medicine? PALONOSETRON (pal oh NOE se tron) is used to prevent nausea and vomiting caused by chemotherapy. It also helps prevent delayed nausea and vomiting that may occur a few days after your treatment. This medicine may be used for other purposes; ask your health care provider or pharmacist if you have questions. COMMON BRAND NAME(S): Aloxi What should I tell my health care provider before I take this medicine? They need to know if you have any of these conditions: -an unusual or allergic reaction to palonosetron, dolasetron, granisetron, ondansetron, other medicines, foods, dyes, or preservatives -pregnant or trying to get pregnant -breast-feeding How should I use this medicine? This medicine is for infusion into a vein. It is given by a health care professional in a  hospital or clinic setting. Talk to your pediatrician regarding the use of this medicine in children. While this drug may be prescribed for children as young as 1 month for selected conditions, precautions do apply. Overdosage: If you think you have taken too much of this medicine contact a poison control center or emergency room at once. NOTE: This medicine is only for you. Do not share this medicine with others. What if I miss a dose? This does not apply. What may interact with this medicine? -certain medicines for depression, anxiety, or psychotic disturbances -fentanyl -linezolid -MAOIs like Carbex, Eldepryl, Marplan, Nardil, and Parnate -methylene blue (injected into a vein) -tramadol This list may not describe all possible interactions. Give your health care provider a list of all the medicines, herbs, non-prescription drugs, or dietary supplements you use. Also tell them if you smoke, drink alcohol, or use illegal drugs. Some items may interact with your medicine. What should I watch for while using this medicine? Your condition will be monitored carefully while you are receiving this medicine. What side effects may I notice from receiving this medicine? Side effects that you should report to your doctor or health care professional as soon as possible: -allergic reactions like skin rash, itching or hives, swelling of the face, lips, or tongue -breathing problems -confusion -dizziness -fast, irregular heartbeat -fever and chills -loss of balance or coordination -seizures -sweating -swelling of the hands and feet -tremors -unusually weak or tired Side effects that usually do not require medical attention (report to your doctor or health care professional if they continue or are bothersome): -constipation or diarrhea -headache This list may not describe all possible  side effects. Call your doctor for medical advice about side effects. You may report side effects to FDA at  1-800-FDA-1088. Where should I keep my medicine? This drug is given in a hospital or clinic and will not be stored at home. NOTE: This sheet is a summary. It may not cover all possible information. If you have questions about this medicine, talk to your doctor, pharmacist, or health care provider.  2018 Elsevier/Gold Standard (2013-02-13 10:38:36)  Aprepitant Emulsion for injection What is this medicine? APREPITANT (ap RE pi tant) is used together with other medicines to prevent nausea and vomiting caused by cancer treatment (chemotherapy). This medicine may be used for other purposes; ask your health care provider or pharmacist if you have questions. COMMON BRAND NAME(S): CINVANTI What should I tell my health care provider before I take this medicine? They need to know if you have any of these conditions: -liver disease -an unusual or allergic reaction to aprepitant, fosaprepitant, other medicines, foods, dyes, or preservatives -pregnant or trying to get pregnant -breast-feeding How should I use this medicine? This medicine is for injection into a vein. It is given by a health care professional in a hospital or clinic setting. Talk to your pediatrician regarding the use of this medicine in children. Special care may be needed. Overdosage: If you think you have taken too much of this medicine contact a poison control center or emergency room at once. NOTE: This medicine is only for you. Do not share this medicine with others. What if I miss a dose? This does not apply. What may interact with this medicine? Do not take this medicine with any of these medicines: -cisapride -flibanserin -lomitapide -pimozide This medicine may also interact with the following medications: -diltiazem -male hormones, like estrogens or progestins and birth control pills -medicines for fungal infections like ketoconazole and itraconazole -medicines for HIV -medicines for seizures or to control epilepsy  like carbamazepine or phenytoin -medicines used for sleep or anxiety disorders like alprazolam, diazepam, or midazolam -nefazodone -paroxetine -ranolazine -rifampin -some chemotherapy medications like etoposide, ifosfamide, vinblastine, vincristine -some antibiotics like clarithromycin, erythromycin, troleandomycin -steroid medicines like dexamethasone or methylprednisolone -tolbutamide -warfarin This list may not describe all possible interactions. Give your health care provider a list of all the medicines, herbs, non-prescription drugs, or dietary supplements you use. Also tell them if you smoke, drink alcohol, or use illegal drugs. Some items may interact with your medicine. What should I watch for while using this medicine? Do not take this medicine if you already have nausea and vomiting. Ask your health care provider what to do if you already have nausea. Birth control pills and other methods of hormonal contraception (for example, IUD or patch) may not work properly while you are taking this medicine. Use an extra method of birth control during treatment and for 1 month after your last dose of aprepitant. This medicine should not be used continuously for a long time. Visit your doctor or health care professional for regular check-ups. This medicine may change your liver function blood test results. What side effects may I notice from receiving this medicine? Side effects that you should report to your doctor or health care professional as soon as possible: -allergic reactions like skin rash, itching or hives, swelling of the face, lips, or tongue -breathing problems -changes in heart rhythm -high or low blood pressure -pain, redness, or irritation at site where injected -rectal bleeding -serious dizziness or disorientation, confusion -sharp or severe stomach pain -sharp pain in  your leg Side effects that usually do not require medical attention (report these to your doctor or  health care professional if they continue or are bothersome): -constipation or diarrhea -hair loss -headache -hiccups -loss of appetite -nausea -upset stomach -tiredness This list may not describe all possible side effects. Call your doctor for medical advice about side effects. You may report side effects to FDA at 1-800-FDA-1088. Where should I keep my medicine? This drug is given in a hospital or clinic and will not be stored at home. NOTE: This sheet is a summary. It may not cover all possible information. If you have questions about this medicine, talk to your doctor, pharmacist, or health care provider.  2018 Elsevier/Gold Standard (2016-03-12 15:45:16)  Pemetrexed injection What is this medicine? PEMETREXED (PEM e TREX ed) is a chemotherapy drug used to treat lung cancers like non-small cell lung cancer and mesothelioma. It may also be used to treat other cancers. This medicine may be used for other purposes; ask your health care provider or pharmacist if you have questions. COMMON BRAND NAME(S): Alimta What should I tell my health care provider before I take this medicine? They need to know if you have any of these conditions: -infection (especially a virus infection such as chickenpox, cold sores, or herpes) -kidney disease -low blood counts, like low white cell, platelet, or red cell counts -lung or breathing disease, like asthma -radiation therapy -an unusual or allergic reaction to pemetrexed, other medicines, foods, dyes, or preservative -pregnant or trying to get pregnant -breast-feeding How should I use this medicine? This drug is given as an infusion into a vein. It is administered in a hospital or clinic by a specially trained health care professional. Talk to your pediatrician regarding the use of this medicine in children. Special care may be needed. Overdosage: If you think you have taken too much of this medicine contact a poison control center or emergency room  at once. NOTE: This medicine is only for you. Do not share this medicine with others. What if I miss a dose? It is important not to miss your dose. Call your doctor or health care professional if you are unable to keep an appointment. What may interact with this medicine? This medicine may interact with the following medications: -Ibuprofen This list may not describe all possible interactions. Give your health care provider a list of all the medicines, herbs, non-prescription drugs, or dietary supplements you use. Also tell them if you smoke, drink alcohol, or use illegal drugs. Some items may interact with your medicine. What should I watch for while using this medicine? Visit your doctor for checks on your progress. This drug may make you feel generally unwell. This is not uncommon, as chemotherapy can affect healthy cells as well as cancer cells. Report any side effects. Continue your course of treatment even though you feel ill unless your doctor tells you to stop. In some cases, you may be given additional medicines to help with side effects. Follow all directions for their use. Call your doctor or health care professional for advice if you get a fever, chills or sore throat, or other symptoms of a cold or flu. Do not treat yourself. This drug decreases your body's ability to fight infections. Try to avoid being around people who are sick. This medicine may increase your risk to bruise or bleed. Call your doctor or health care professional if you notice any unusual bleeding. Be careful brushing and flossing your teeth or using a  toothpick because you may get an infection or bleed more easily. If you have any dental work done, tell your dentist you are receiving this medicine. Avoid taking products that contain aspirin, acetaminophen, ibuprofen, naproxen, or ketoprofen unless instructed by your doctor. These medicines may hide a fever. Call your doctor or health care professional if you get  diarrhea or mouth sores. Do not treat yourself. To protect your kidneys, drink water or other fluids as directed while you are taking this medicine. Do not become pregnant while taking this medicine or for 6 months after stopping it. Women should inform their doctor if they wish to become pregnant or think they might be pregnant. Men should not father a child while taking this medicine and for 3 months after stopping it. This may interfere with the ability to father a child. You should talk to your doctor or health care professional if you are concerned about your fertility. There is a potential for serious side effects to an unborn child. Talk to your health care professional or pharmacist for more information. Do not breast-feed an infant while taking this medicine or for 1 week after stopping it. What side effects may I notice from receiving this medicine? Side effects that you should report to your doctor or health care professional as soon as possible: -allergic reactions like skin rash, itching or hives, swelling of the face, lips, or tongue -breathing problems -redness, blistering, peeling or loosening of the skin, including inside the mouth -signs and symptoms of bleeding such as bloody or black, tarry stools; red or dark-brown urine; spitting up blood or brown material that looks like coffee grounds; red spots on the skin; unusual bruising or bleeding from the eye, gums, or nose -signs and symptoms of infection like fever or chills; cough; sore throat; pain or trouble passing urine -signs and symptoms of kidney injury like trouble passing urine or change in the amount of urine -signs and symptoms of liver injury like dark yellow or brown urine; general ill feeling or flu-like symptoms; light-colored stools; loss of appetite; nausea; right upper belly pain; unusually weak or tired; yellowing of the eyes or skin Side effects that usually do not require medical attention (report to your doctor or  health care professional if they continue or are bothersome): -constipation -dizziness -mouth sores -nausea, vomiting -pain, tingling, numbness in the hands or feet -unusually weak or tired This list may not describe all possible side effects. Call your doctor for medical advice about side effects. You may report side effects to FDA at 1-800-FDA-1088. Where should I keep my medicine? This drug is given in a hospital or clinic and will not be stored at home. NOTE: This sheet is a summary. It may not cover all possible information. If you have questions about this medicine, talk to your doctor, pharmacist, or health care provider.  2018 Elsevier/Gold Standard (2016-02-07 18:51:46)  Cisplatin injection What is this medicine? CISPLATIN (SIS pla tin) is a chemotherapy drug. It targets fast dividing cells, like cancer cells, and causes these cells to die. This medicine is used to treat many types of cancer like bladder, ovarian, and testicular cancers. This medicine may be used for other purposes; ask your health care provider or pharmacist if you have questions. COMMON BRAND NAME(S): Platinol, Platinol -AQ What should I tell my health care provider before I take this medicine? They need to know if you have any of these conditions: -blood disorders -hearing problems -kidney disease -recent or ongoing radiation therapy -  an unusual or allergic reaction to cisplatin, carboplatin, other chemotherapy, other medicines, foods, dyes, or preservatives -pregnant or trying to get pregnant -breast-feeding How should I use this medicine? This drug is given as an infusion into a vein. It is administered in a hospital or clinic by a specially trained health care professional. Talk to your pediatrician regarding the use of this medicine in children. Special care may be needed. Overdosage: If you think you have taken too much of this medicine contact a poison control center or emergency room at once. NOTE:  This medicine is only for you. Do not share this medicine with others. What if I miss a dose? It is important not to miss a dose. Call your doctor or health care professional if you are unable to keep an appointment. What may interact with this medicine? -dofetilide -foscarnet -medicines for seizures -medicines to increase blood counts like filgrastim, pegfilgrastim, sargramostim -probenecid -pyridoxine used with altretamine -rituximab -some antibiotics like amikacin, gentamicin, neomycin, polymyxin B, streptomycin, tobramycin -sulfinpyrazone -vaccines -zalcitabine Talk to your doctor or health care professional before taking any of these medicines: -acetaminophen -aspirin -ibuprofen -ketoprofen -naproxen This list may not describe all possible interactions. Give your health care provider a list of all the medicines, herbs, non-prescription drugs, or dietary supplements you use. Also tell them if you smoke, drink alcohol, or use illegal drugs. Some items may interact with your medicine. What should I watch for while using this medicine? Your condition will be monitored carefully while you are receiving this medicine. You will need important blood work done while you are taking this medicine. This drug may make you feel generally unwell. This is not uncommon, as chemotherapy can affect healthy cells as well as cancer cells. Report any side effects. Continue your course of treatment even though you feel ill unless your doctor tells you to stop. In some cases, you may be given additional medicines to help with side effects. Follow all directions for their use. Call your doctor or health care professional for advice if you get a fever, chills or sore throat, or other symptoms of a cold or flu. Do not treat yourself. This drug decreases your body's ability to fight infections. Try to avoid being around people who are sick. This medicine may increase your risk to bruise or bleed. Call your  doctor or health care professional if you notice any unusual bleeding. Be careful brushing and flossing your teeth or using a toothpick because you may get an infection or bleed more easily. If you have any dental work done, tell your dentist you are receiving this medicine. Avoid taking products that contain aspirin, acetaminophen, ibuprofen, naproxen, or ketoprofen unless instructed by your doctor. These medicines may hide a fever. Do not become pregnant while taking this medicine. Women should inform their doctor if they wish to become pregnant or think they might be pregnant. There is a potential for serious side effects to an unborn child. Talk to your health care professional or pharmacist for more information. Do not breast-feed an infant while taking this medicine. Drink fluids as directed while you are taking this medicine. This will help protect your kidneys. Call your doctor or health care professional if you get diarrhea. Do not treat yourself. What side effects may I notice from receiving this medicine? Side effects that you should report to your doctor or health care professional as soon as possible: -allergic reactions like skin rash, itching or hives, swelling of the face, lips, or tongue -signs  of infection - fever or chills, cough, sore throat, pain or difficulty passing urine -signs of decreased platelets or bleeding - bruising, pinpoint red spots on the skin, black, tarry stools, nosebleeds -signs of decreased red blood cells - unusually weak or tired, fainting spells, lightheadedness -breathing problems -changes in hearing -gout pain -low blood counts - This drug may decrease the number of white blood cells, red blood cells and platelets. You may be at increased risk for infections and bleeding. -nausea and vomiting -pain, swelling, redness or irritation at the injection site -pain, tingling, numbness in the hands or feet -problems with balance, movement -trouble passing  urine or change in the amount of urine Side effects that usually do not require medical attention (report to your doctor or health care professional if they continue or are bothersome): -changes in vision -loss of appetite -metallic taste in the mouth or changes in taste This list may not describe all possible side effects. Call your doctor for medical advice about side effects. You may report side effects to FDA at 1-800-FDA-1088. Where should I keep my medicine? This drug is given in a hospital or clinic and will not be stored at home. NOTE: This sheet is a summary. It may not cover all possible information. If you have questions about this medicine, talk to your doctor, pharmacist, or health care provider.  2018 Elsevier/Gold Standard (2007-07-15 14:40:54)

## 2016-07-09 ENCOUNTER — Other Ambulatory Visit (HOSPITAL_BASED_OUTPATIENT_CLINIC_OR_DEPARTMENT_OTHER): Payer: BLUE CROSS/BLUE SHIELD

## 2016-07-09 ENCOUNTER — Encounter: Payer: Self-pay | Admitting: Internal Medicine

## 2016-07-09 ENCOUNTER — Ambulatory Visit (HOSPITAL_BASED_OUTPATIENT_CLINIC_OR_DEPARTMENT_OTHER): Payer: BLUE CROSS/BLUE SHIELD | Admitting: Internal Medicine

## 2016-07-09 VITALS — BP 130/76 | HR 112 | Temp 97.9°F | Resp 18 | Ht 75.0 in | Wt 148.7 lb

## 2016-07-09 DIAGNOSIS — C3432 Malignant neoplasm of lower lobe, left bronchus or lung: Secondary | ICD-10-CM

## 2016-07-09 DIAGNOSIS — R000 Tachycardia, unspecified: Secondary | ICD-10-CM | POA: Diagnosis not present

## 2016-07-09 LAB — MAGNESIUM: Magnesium: 2.1 mg/dl (ref 1.5–2.5)

## 2016-07-09 LAB — CBC WITH DIFFERENTIAL/PLATELET
BASO%: 0.3 % (ref 0.0–2.0)
BASOS ABS: 0 10*3/uL (ref 0.0–0.1)
EOS%: 3.5 % (ref 0.0–7.0)
Eosinophils Absolute: 0.3 10*3/uL (ref 0.0–0.5)
HCT: 39.9 % (ref 38.4–49.9)
HGB: 13.6 g/dL (ref 13.0–17.1)
LYMPH%: 26.6 % (ref 14.0–49.0)
MCH: 31 pg (ref 27.2–33.4)
MCHC: 34 g/dL (ref 32.0–36.0)
MCV: 91.1 fL (ref 79.3–98.0)
MONO#: 0.8 10*3/uL (ref 0.1–0.9)
MONO%: 8.4 % (ref 0.0–14.0)
NEUT%: 61.2 % (ref 39.0–75.0)
NEUTROS ABS: 5.5 10*3/uL (ref 1.5–6.5)
Platelets: 302 10*3/uL (ref 140–400)
RBC: 4.38 10*6/uL (ref 4.20–5.82)
RDW: 14.7 % — AB (ref 11.0–14.6)
WBC: 9 10*3/uL (ref 4.0–10.3)
lymph#: 2.4 10*3/uL (ref 0.9–3.3)

## 2016-07-09 LAB — COMPREHENSIVE METABOLIC PANEL
ALT: 26 U/L (ref 0–55)
AST: 24 U/L (ref 5–34)
Albumin: 3.8 g/dL (ref 3.5–5.0)
Alkaline Phosphatase: 102 U/L (ref 40–150)
Anion Gap: 8 mEq/L (ref 3–11)
BUN: 17.8 mg/dL (ref 7.0–26.0)
CHLORIDE: 99 meq/L (ref 98–109)
CO2: 29 meq/L (ref 22–29)
Calcium: 10.1 mg/dL (ref 8.4–10.4)
Creatinine: 1.1 mg/dL (ref 0.7–1.3)
EGFR: 86 mL/min/{1.73_m2} — ABNORMAL LOW (ref >90)
Glucose: 103 mg/dl (ref 70–140)
POTASSIUM: 4.2 meq/L (ref 3.5–5.1)
SODIUM: 136 meq/L (ref 136–145)
TOTAL PROTEIN: 7.4 g/dL (ref 6.4–8.3)
Total Bilirubin: 0.44 mg/dL (ref 0.20–1.20)

## 2016-07-09 NOTE — Progress Notes (Signed)
[facility] [phone]   Fax:([phone]) [phone]  OFFICE PROGRESS NOTE  [name], MD [place] [address]  DIAGNOSIS: stage IB (T2a, N0, M0) non-small cell lung cancer, adenocarcinoma diagnosed in December 2017.  PRIOR THERAPY: status post left lower lobectomy as well as wedge resection of the left upper lobe on 05/11/2016.  CURRENT THERAPY: Adjuvant systemic chemotherapy with cisplatin 75 MG/M2 and Alimta 500 MG/M2 every 3 weeks. First dose 07/03/2016.  INTERVAL HISTORY: [name] [name] 61 y.o. male returns to the clinic today for follow-up visit accompanied by his wife. The patient is feeling fine today with no specific complaints except for mild soreness on the left side of the chest at the surgical scar. He tolerated the first week of his adjuvant chemotherapy with cisplatin and Alimta well. He denied having any nausea or vomiting. He has no significant weight loss or night sweats. He denied having any shortness of breath, cough or hemoptysis. He has no fever or chills. He is here today for evaluation and repeat blood work.  MEDICAL HISTORY: Past Medical History:  Diagnosis Date  . AAA (abdominal aortic aneurysm) ([facility])   . Encounter for antineoplastic chemotherapy 06/06/2016  . History of hepatitis B   . HNP (herniated nucleus pulposus), lumbar    L4 with radiculopathy  . Hypertension   . Mass of left lung   . Mitral insufficiency 04/06/2016   This patient will eventually need MV repair if his prognosis is good from oncology standpoint. However, his lung cancer therapy  is the priority right now. His cardiac condition won't preclude possible lung surgery.  Once his lung cancer is under control he will need a TEE to further evaluate his mitral valve anatomy and MR severity, and also have ischemic workup as tere is evidence on calcificati  . Varicose veins of legs     ALLERGIES:  is allergic to tramadol.  MEDICATIONS:    Current Outpatient Prescriptions  Medication Sig Dispense Refill  . carvedilol (COREG) 3.125 MG tablet Take 1 tablet by mouth 2 (two) times daily.  1  . dexamethasone (DECADRON) 4 MG tablet 4 mg by mouth twice a day the day before, day of and day after the chemotherapy every 3 weeks. 40 tablet 0  . folic acid (FOLVITE) 1 MG tablet Take 1 tablet (1 mg total) by mouth daily. 30 tablet 4  . losartan (COZAAR) 25 MG tablet Take 25 mg by mouth daily.  1  . oxyCODONE (OXY IR/ROXICODONE) 5 MG immediate release tablet Take 1 tablet (5 mg total) by mouth every 6 (six) hours as needed for severe pain. (Patient not taking: Reported on 06/14/2016) 30 tablet 0  . prochlorperazine (COMPAZINE) 10 MG tablet Take 1 tablet (10 mg total) by mouth every 6 (six) hours as needed for nausea or vomiting. 30 tablet 0   No current facility-administered medications for this visit.     SURGICAL HISTORY:  Past Surgical History:  Procedure Laterality Date  . ABDOMINAL AORTIC ENDOVASCULAR STENT GRAFT N/A 03/12/2016   Procedure: ABDOMINAL AORTIC ENDOVASCULAR STENT GRAFT;  Surgeon: Conrad Ludlow, MD;  Location: [facility];  Service: Vascular;  Laterality: N/A;  . COLONOSCOPY    . INGUINAL HERNIA REPAIR Left 1975   Left inguinal hernia  . INGUINAL HERNIA REPAIR Right   . LOBECTOMY Left 05/11/2016   Procedure: LEFT LOWER LOBE LOBECTOMY AND LEFT UPPER LOBE  RESECTION AND PLACEMENT OF ON-Q;  Surgeon: Lilia Argue  Servando Snare, MD;  Location: [facility];  Service: Thoracic;  Laterality: Left;  . LUMBAR LAMINECTOMY  October 22, 2012  . LYMPH NODE DISSECTION Left 05/11/2016   Procedure: LYMPH NODE DISSECTION;  Surgeon: [name], MD;  Location: [facility];  Service: Thoracic;  Laterality: Left;  . STAPLING OF BLEBS Left 05/11/2016   Procedure: STAPLING OF APICAL BLEB;  Surgeon: [name], MD;  Location: [facility];  Service: Thoracic;  Laterality: Left;  Marland Kitchen VIDEO ASSISTED THORACOSCOPY Left 05/18/2016   Procedure: VIDEO ASSISTED THORACOSCOPY WITH  REMOVAL OF LEFT APICAL BLEB;  Surgeon: [name], MD;  Location: [facility];  Service: Thoracic;  Laterality: Left;  Marland Kitchen VIDEO ASSISTED THORACOSCOPY (VATS)/WEDGE RESECTION Left 05/11/2016   Procedure: LEFT VIDEO ASSISTED THORACOSCOPY (VATS);  Surgeon: [name], MD;  Location: [facility];  Service: Thoracic;  Laterality: Left;  Marland Kitchen VIDEO BRONCHOSCOPY N/A 05/11/2016   Procedure: VIDEO BRONCHOSCOPY, LEFT LUNG;  Surgeon: [name], MD;  Location: [facility];  Service: Thoracic;  Laterality: N/A;  . VIDEO BRONCHOSCOPY N/A 05/18/2016   Procedure: VIDEO BRONCHOSCOPY WITH BRONCHIAL WASHING;  Surgeon: [name], MD;  Location: [facility];  Service: Thoracic;  Laterality: N/A;    REVIEW OF SYSTEMS:  A comprehensive review of systems was negative except for: Respiratory: positive for pleurisy/chest pain   PHYSICAL EXAMINATION: General appearance: alert, cooperative and no distress Head: Normocephalic, without obvious abnormality, atraumatic Neck: no adenopathy, no JVD, supple, symmetrical, trachea midline and thyroid not enlarged, symmetric, no tenderness/mass/nodules Lymph nodes: Cervical, supraclavicular, and axillary nodes normal. Resp: clear to auscultation bilaterally Back: symmetric, no curvature. ROM normal. No CVA tenderness. Cardio: regular rate and rhythm, S1, S2 normal, no murmur, click, rub or gallop GI: soft, non-tender; bowel sounds normal; no masses,  no organomegaly Extremities: extremities normal, atraumatic, no cyanosis or edema  ECOG PERFORMANCE STATUS: 1 - Symptomatic but completely ambulatory  Blood pressure 130/76, pulse (!) 112, temperature 97.9 F (36.6 C), temperature source Oral, resp. rate 18, height '6\' 3"'$  (1.905 m), weight 148 lb 11.2 oz (67.4 kg), SpO2 96 %.  LABORATORY DATA: Lab Results  Component Value Date   WBC 9.0 07/09/2016   HGB 13.6 07/09/2016   HCT 39.9 07/09/2016   MCV 91.1 07/09/2016   PLT 302 07/09/2016      Chemistry      Component Value  Date/Time   NA 137 07/03/2016 0849   K 3.9 07/03/2016 0849   CL 95 (L) 05/20/2016 0400   CO2 21 (L) 07/03/2016 0849   BUN 7.6 07/03/2016 0849   CREATININE 0.9 07/03/2016 0849      Component Value Date/Time   CALCIUM 9.9 07/03/2016 0849   ALKPHOS 108 07/03/2016 0849   AST 18 07/03/2016 0849   ALT 19 07/03/2016 0849   BILITOT 0.38 07/03/2016 0849       RADIOGRAPHIC STUDIES: Dg Chest 2 View  Result Date: 06/14/2016 CLINICAL DATA:  History of lung surgery. EXAM: CHEST  2 VIEW COMPARISON:  05/24/2016 . FINDINGS: Postsurgical changes left lung. Improving left apical hydropneumothorax. Mild residual. Pleural-parenchymal thickening on the left noted consistent with scarring. Stable cardiomegaly. Stable posterolateral seventh rib fracture again noted. IMPRESSION: 1. Postsurgical changes left lung with improving left apical hydropneumothorax. Mild residual. 2. Stable cardiomegaly. Electronically Signed   By: Marcello Moores  Register   On: 06/14/2016 14:38    ASSESSMENT AND PLAN:  This is a very pleasant 61 years old white male with stage IB non-small cell lung cancer, adenocarcinoma status  post left lower lobectomy as well as wedge resection of the left upper lobe. He is currently undergoing adjuvant systemic chemotherapy with cisplatin and Alimta status post 1 cycle. He tolerated the first cycle of his treatment well. I recommended for the patient to continue his treatment as scheduled and he is expected to start cycle #2 in 2 weeks. He would come back for follow-up visit for follow-up visit at that time. For the tachycardia, he was advised to increase his oral hydration. He was advised to call immediately if he has any concerning symptoms in the interval. The patient voices understanding of current disease status and treatment options and is in agreement with the current care plan. All questions were answered. The patient knows to call the clinic with any problems, questions or concerns. We can  certainly see the patient much sooner if necessary. I spent 10 minutes counseling the patient face to face. The total time spent in the appointment was 15 minutes.   Disclaimer: This note was dictated with voice recognition software. Similar sounding words can inadvertently be transcribed and may not be corrected upon review.

## 2016-07-10 ENCOUNTER — Encounter (HOSPITAL_COMMUNITY): Payer: Self-pay | Admitting: Emergency Medicine

## 2016-07-10 ENCOUNTER — Encounter (HOSPITAL_COMMUNITY): Payer: Self-pay | Admitting: *Deleted

## 2016-07-10 ENCOUNTER — Other Ambulatory Visit: Payer: Self-pay | Admitting: Otolaryngology

## 2016-07-10 NOTE — Progress Notes (Signed)
Spoke with pt for pre-op call. Pt has hx of mitral valve insufficiency, has had aorta stent repair in Nov. 2017. Has recently been diagnosed with lung cancer. Pt states he has stopped his Carvedilol and Losartan "because I've started chemo". I asked him who told him to stop his meds and he never said anyone told him to do so. He states he stopped his Carvedilol after his discharge from his lung resection surgery. States he stopped his Losartan about 6 weeks ago. He states he's watching his blood pressure at home and it's doing "good". Pt denies any chest pain. Will have Anesthesia PA/NP review chart.

## 2016-07-10 NOTE — Progress Notes (Signed)
Anesthesia Chart Review:  Pt is a same day work up.   Pt is a 61 year old male scheduled for B cerumen removal on 07/11/2016 with Leta Baptist, MD.   - PCP is [name], MD - Cardiologist is [name], MD, last office visit 05/04/16 with Cecilie Kicks, NP (pt was cleared for lung surgery at that visit); next office visit 07/13/16 - Oncologist is Francie Massing, MD  PMH includes:  Mitral insufficiency (will likely need MVR once lung cancer prognosis established), HTN, AAA (s/p EVAR 03/12/16), lung cancer (s/p VATS, L apical bleb removal 05/18/16 and s/p VATS, LL lobectomy and wedge resection 05/11/16).  Former smoker. BMI 18.5  Medications include: Carvedilol, dexamethasone, losartan.  - Pt told PAT RN he stopped carvedilol and losartan several weeks ago because he was going to start chemo.   - PAT RN is attempting to reach out to oncology office to have them reassure pt he can take bp meds while receiving chemo.    Labs 07/09/16 reviewed.    CXR 06/14/16:  1. Postsurgical changes left lung with improving left apical hydropneumothorax. Mild residual. 2. Stable cardiomegaly.  EKG 05/04/16: NSR. ST elevation, consider early repolarization.   Cardiopulmonary exercise test 04/25/16: Exercise testing with gas exchange demonstrates normal functional capacity when compared to matched sedentary norms. Patient is primarily ventilatory limited and is clearly of obstructive nature. VE/VCO2 is elevated and indicates significant dead space ventilation most likely due to obstructive lung disease. Patient's measured VO74mx is low-normal at 22.1 ml/kg/min and indicates relatively little increased risk associated with lung resection. However, note patient's resting tachycardia (with consideration of no medication for one week prior to exercise).   Echo 03/12/16:  - Left ventricle: The cavity size was normal. Wall thickness was increased in a pattern of moderate LVH. Systolic function was normal. The  estimated ejection fraction was in the range of 60% to 65%. - Mitral valve: Anterior leaflet prolapse with likley moderate eccentric posteriorly directed MR. Anteiror leaflet is very thickend and myxomatous. Cannot r/o parachute MV doubt vegetation consider TEE if clinically indicated - Atrial septum: No defect or patent foramen ovale was identified.  Reviewed case with Dr. HMarcie Bal  Given pt is not taking carvedilol and losartan as prescribed, and given pt's mitral valve disease, pt will need to see cardiology and be optimized before surgery.  Pt has appt with Dr. NMeda Coffee3/23/18.  I notified Heather in Dr. TDeeann Saintoffice.   AWilleen Cass FNP-BC [facility] Phone: ([phone]/20/2018 3:43 PM

## 2016-07-10 NOTE — Progress Notes (Addendum)
I have left a message for the triage nurse at Dr. Peggye Ley office for someone to call me back. We need for someone at Dr. Peggye Ley office to call pt and tell him to start taking his HTN meds, per Willeen Cass, NP.

## 2016-07-10 NOTE — Progress Notes (Signed)
Called Dr. Peggye Ley office again and left message on his nurse's voicemail.

## 2016-07-11 ENCOUNTER — Other Ambulatory Visit: Payer: Self-pay | Admitting: Cardiothoracic Surgery

## 2016-07-11 ENCOUNTER — Ambulatory Visit (HOSPITAL_COMMUNITY): Admission: RE | Admit: 2016-07-11 | Payer: BLUE CROSS/BLUE SHIELD | Source: Ambulatory Visit | Admitting: Otolaryngology

## 2016-07-11 DIAGNOSIS — C3490 Malignant neoplasm of unspecified part of unspecified bronchus or lung: Secondary | ICD-10-CM

## 2016-07-11 HISTORY — DX: Malignant (primary) neoplasm, unspecified: C80.1

## 2016-07-11 SURGERY — REMOVAL, CERUMEN, IMPACTED
Anesthesia: General | Laterality: Bilateral

## 2016-07-12 ENCOUNTER — Ambulatory Visit
Admission: RE | Admit: 2016-07-12 | Discharge: 2016-07-12 | Disposition: A | Discharge status: Home / Self Care | Payer: No Typology Code available for payment source | Source: Ambulatory Visit | Attending: Cardiothoracic Surgery | Admitting: Cardiothoracic Surgery

## 2016-07-12 ENCOUNTER — Ambulatory Visit (INDEPENDENT_AMBULATORY_CARE_PROVIDER_SITE_OTHER): Payer: Self-pay | Admitting: Cardiothoracic Surgery

## 2016-07-12 ENCOUNTER — Encounter: Payer: Self-pay | Admitting: Cardiothoracic Surgery

## 2016-07-12 VITALS — BP 140/84 | HR 100 | Resp 20 | Ht 75.0 in | Wt 148.8 lb

## 2016-07-12 DIAGNOSIS — C3432 Malignant neoplasm of lower lobe, left bronchus or lung: Secondary | ICD-10-CM

## 2016-07-12 DIAGNOSIS — C3490 Malignant neoplasm of unspecified part of unspecified bronchus or lung: Secondary | ICD-10-CM

## 2016-07-12 DIAGNOSIS — Z902 Acquired absence of lung [part of]: Secondary | ICD-10-CM

## 2016-07-12 NOTE — Progress Notes (Signed)
[address]       [place] [address]             [phone]      Lamark K Ohlendorf [name] [facility] Medical Record #[id number] Date of Birth: 1955-09-14  Referring: [name], MD Primary Care: [name], MD  Chief Complaint:   POST OP FOLLOW UP 05/18/2016 DATE OF DISCHARGE: OPERATIVE REPOR PREOPERATIVE DIAGNOSIS:  Persistent air leak after left lower lobectomy and wedge resection of left upper lobe for stage IB adenocarcinoma of the lung. POSTOPERATIVE DIAGNOSIS:  Persistent air leak after left lower lobectomy and wedge resection of left upper lobe for stage IB adenocarcinoma of the lung. SURGICAL PROCEDURE:  Repeat left video-assisted thoracoscopy and stapling and excision of apical blebs. SURGEON:  Lanelle Bal, MD 05/11/2016 OPERATIVE REPOR PREOPERATIVE DIAGNOSIS:  A 4-cm mass, left lower lobe, biopsy-proven non- small cell carcinoma. POSTOPERATIVE DIAGNOSIS:  A 4-cm mass, left lower lobe, biopsy-proven non-small cell carcinoma. SURGICAL PROCEDURE:  Bronchoscopy; left video-assisted thoracoscopy; mini thoracotomy; left lower lobectomy; wedge resection, left upper lobe; lymph node dissection; placement of On-Q. SURGEON:  Lanelle Bal, MD  Cancer Staging Malignant neoplasm of lower lobe of left lung ([facility]) Staging form: Lung, AJCC 8th Edition - Clinical: Stage IIA (cT2b, cN0, cM0) - Signed by [name], MD on 05/15/2016 - Pathologic stage from 05/15/2016: Stage IIA (pT2b, pN0, cM0) - Signed by [name], MD on 05/15/2016  History of Present Illness:     Patient returns to the office stay in follow-up after his recent left lower lobectomy and wedge resection of the left upper lobe for a stage II a moderately differentiated adenocarcinoma of the lung. The patient had a protracted postop course when the had persistent air leak from her ruptured bleb of the upper lobe and required repeat surgery. He comes in today feeling  well without symptoms of shortness of breath or congestive heart failure. He continues avoiding cigarettes  He has started his first of 4 rounds of chemotherapy and so far is tolerating this well.    Past Medical History:  Diagnosis Date  . AAA (abdominal aortic aneurysm) ([facility])   . Encounter for antineoplastic chemotherapy 06/06/2016  . History of hepatitis B   . HNP (herniated nucleus pulposus), lumbar    L4 with radiculopathy  . Hypertension   . Mass of left lung   . Mitral insufficiency 04/06/2016     . Varicose veins of legs      History  Smoking Status  . Former Smoker  . Packs/day: 1.00  . Types: Cigarettes  . Quit date: 03/05/2016  Smokeless Tobacco  . Never Used    History  Alcohol Use  . 7.2 oz/week  . 12 Cans of beer per week    Comment: none since 02/2016     Allergies  Allergen Reactions  . Tramadol Nausea And Vomiting    Current Outpatient Prescriptions  Medication Sig Dispense Refill  . dexamethasone (DECADRON) 4 MG tablet 4 mg by mouth twice a day the day before, day of and day after the chemotherapy every 3 weeks. 40 tablet 0  . folic acid (FOLVITE) 1 MG tablet Take 1 tablet (1 mg total) by mouth daily. 30 tablet 4  . oxyCODONE (OXY IR/ROXICODONE) 5 MG immediate release tablet Take 1 tablet (5 mg total) by mouth every 6 (six) hours as needed for severe pain. 30 tablet 0  . prochlorperazine (COMPAZINE) 10 MG tablet Take 1 tablet (  10 mg total) by mouth every 6 (six) hours as needed for nausea or vomiting. 30 tablet 0  . carvedilol (COREG) 3.125 MG tablet Take 1 tablet by mouth 2 (two) times daily.  1  . losartan (COZAAR) 25 MG tablet Take 25 mg by mouth daily.  1   No current facility-administered medications for this visit.        Physical Exam: BP 140/84   Pulse 100   Resp 20   Ht '6\' 3"'$  (1.905 m)   Wt 148 lb 12.8 oz (67.5 kg)   SpO2 99% Comment: RA  BMI 18.60 kg/m   General appearance: alert, cooperative and appears older than stated  age Neurologic: intact Heart: systolic murmur: holosystolic 3/6, crescendo radiates to back Lungs: clear to auscultation bilaterally Abdomen: soft, non-tender; bowel sounds normal; no masses,  no organomegaly Extremities: extremities normal, atraumatic, no cyanosis or edema and no edema, redness or tenderness in the calves or thighs Wound: Patient's left chest incisions are healing well   Diagnostic Studies & Laboratory data:     Recent Radiology Findings:   Dg Chest 2 View  Result Date: 07/12/2016 CLINICAL DATA:  61 year old male with history of lung cancer status post left-sided the a TIS procedure on 05/18/2016 presenting with left-sided chest pain today. EXAM: CHEST  2 VIEW COMPARISON:  Chest x-ray 06/14/2016. FINDINGS: Postoperative changes of left lower lobectomy are redemonstrated. Compensatory hyperexpansion of the left upper lobe is noted. The continues to be increased density projecting over the mid thoracic spine noted on the lateral projection, likely to reflect some posteriorly loculated left-sided pleural fluid. Lungs are otherwise clear. No definite right pleural effusion. No pneumothorax. Emphysematous changes are again noted. Apical thickening on the left may represent some additional loculated fluid or pleural thickening. No evidence of pulmonary edema. Heart size is normal. Upper mediastinal contours are within normal limits. Aortic atherosclerosis. IMPRESSION: 1. Evolving postoperative changes in the left hemithorax, similar to the prior study, with persistent loculated pleural fluid in the posterior aspect of the mid and upper left hemithorax. 2. Aortic atherosclerosis. 3. Emphysema. Electronically Signed   By: Vinnie Langton M.D.   On: 07/12/2016 10:26      Recent Lab Findings: Lab Results  Component Value Date   WBC 9.0 07/09/2016   HGB 13.6 07/09/2016   HCT 39.9 07/09/2016   PLT 302 07/09/2016   GLUCOSE 103 07/09/2016   ALT 26 07/09/2016   AST 24 07/09/2016   NA  136 07/09/2016   K 4.2 07/09/2016   CL 95 (L) 05/20/2016   CREATININE 1.1 07/09/2016   BUN 17.8 07/09/2016   CO2 29 07/09/2016   INR 1.13 05/18/2016   Diagnosis 1. Foreign body, removal w/tissue, Left Lung Needle Track - FOREIGN MATERIAL. - THERE IS NO EVIDENCE OF MALIGNANCY. 2. Lung, resection (segmental or lobe), Left Lower Lobe w/ part of Upper Lobe - INVASIVE ADENOCARCINOMA, MODERATELY DIFFERENTIATED, SPANNING 4.2 CM. - ADENOCARCINOMA IS PRESENT AT THE STAPLED RESECTION MARGIN OF SPECIMEN #2 AND VASCULAR RESECTION MARGIN OF #2. - LYMPHOVASCULAR INVASION IS IDENTIFIED. - THERE IS NO EVIDENCE OF CARCINOMA IN 2 OF 2 LYMPH NODES (0/2). - SEE ONCOLOGY TABLE BELOW. 3. Lung, wedge biopsy/resection, Frozen section additional Margin (from LLL and part of LUL) - BENIGN LUNG PARENCHYMA WITH INTRA-ALVEOLAR PIGMENT LADEN MACROPHAGES. - THERE IS NO EVIDENCE OF MALIGNANCY. 4. Lymph node, biopsy, 11L - THERE IS NO EVIDENCE OF CARCINOMA IN 1 OF 1 LYMPH NODE (0/1). 5. Lymph node, biopsy, 9L - THERE  IS NO EVIDENCE OF CARCINOMA IN 1 OF 1 LYMPH NODE (0/1). 6. Lymph node, biopsy, 8 - THERE IS NO EVIDENCE OF CARCINOMA IN 1 OF 1 LYMPH NODE (0/1). 7. Lymph node, biopsy, 8 #2 - THERE IS NO EVIDENCE OF CARCINOMA IN 1 OF 1 LYMPH NODE (0/1). 8. Lymph node, biopsy, 10L - THERE IS NO EVIDENCE OF CARCINOMA IN 1 OF 1 LYMPH NODE (0/1). 9. Lymph node, biopsy, 4L - THERE IS NO EVIDENCE OF CARCINOMA IN 1 OF 1 LYMPH NODE (0/1). 10. Lymph node, biopsy, 4L #2 - THERE IS NO EVIDENCE OF CARCINOMA IN 1 OF 1 LYMPH NODE (0/1). 11. Lymph node, biopsy, 2L - THERE IS NO EVIDENCE OF CARCINOMA IN 1 OF 1 LYMPH NODE (0/1). 12. Lymph node, biopsy, 11L #2 - THERE IS NO EVIDENCE OF CARCINOMA IN 1 OF 1 LYMPH NODE (0/1). 13. Lymph node, biopsy, 12L - THERE IS NO EVIDENCE OF CARCINOMA IN 1 OF 1 LYMPH NODE (0/ 12. Lymph node, biopsy, 11L #2 - THERE IS NO EVIDENCE OF CARCINOMA IN 1 OF 1 LYMPH NODE (0/1). 13. Lymph node, biopsy,  12L - THERE IS NO EVIDENCE OF CARCINOMA IN 1 OF 1 LYMPH NODE (0/1). 14. Lung, bleb(s) / bullae, apical - BENIGN LUNG WITH BULLAE FORMATION. - THERE IS NO EVIDENCE OF MALIGNANCY. Microscopic Comment 2. LUNG Specimen, including laterality: Left lung Procedure: LUL and LLL resections(s) and additional parenchymal margin resection with multiple lymph node resections Specimen integrity (intact/disrupted): Intact Tumor site: Lateral portion of specimen #2 Tumor focality: Unifocal Maximum tumor size (cm): At least 4.2 cm Histologic type: Invasive adenocarcinoma Grade: Moderately differentiated Margins: Adenocarcinoma is present at the stapled resection margin of specimen #2 and free floating carcinoma is present in perivascular tissue in the vascular resection margins of specimen #2. Visceral pleura invasion: Not definitively identified Tumor extension: Confined to lung parenchyma Treatment effect (if treated with neoadjuvant therapy): N/A Lymph -Vascular invasion: Present Lymph nodes: Number examined - 12; Number N1 nodes positive 0; Number N2 nodes positive 0 TNM code: pT2a, pN0 Ancillary studies: Can be performed upon clinician request Non-neoplastic lung: Emphysematous changes and intra-alveolar macrophages. Comments: There is microscopic tumor present in perivascular areas from tissue submitted from the vascular resection margins of specimen #2. (JBK:kh 05/15/16)  Assessment / Plan:    Patient doing well following recent left lower lobectomy and wedge resection of the left upper lobe for a stage II a moderately differentiated adeno carcinoma, the original resection margin of the upper lobe specimen #2 was positive, additional margin was obtained from the left upper lobe and labeled specimen #3 and is negative. Patient currently has completed the first of 4 rounds of postoperative chemotherapy treatment.   Plan see the patient back in 4 months   [name] MD      [address] [place] [address] Office [phone]   Beeper [phone]  07/12/2016 11:15 AM

## 2016-07-13 ENCOUNTER — Ambulatory Visit (INDEPENDENT_AMBULATORY_CARE_PROVIDER_SITE_OTHER): Payer: No Typology Code available for payment source | Admitting: Cardiology

## 2016-07-13 ENCOUNTER — Encounter: Payer: Self-pay | Admitting: Cardiology

## 2016-07-13 VITALS — BP 146/84 | HR 75 | Ht 75.0 in | Wt 147.4 lb

## 2016-07-13 DIAGNOSIS — I340 Nonrheumatic mitral (valve) insufficiency: Secondary | ICD-10-CM

## 2016-07-13 DIAGNOSIS — I341 Nonrheumatic mitral (valve) prolapse: Secondary | ICD-10-CM

## 2016-07-13 DIAGNOSIS — Z902 Acquired absence of lung [part of]: Secondary | ICD-10-CM | POA: Diagnosis not present

## 2016-07-13 DIAGNOSIS — C3432 Malignant neoplasm of lower lobe, left bronchus or lung: Secondary | ICD-10-CM

## 2016-07-13 NOTE — Progress Notes (Signed)
Cardiology Office Note    Date:  07/13/2016   ID:  [name], DOB [birth year], MRN [id number]  PCP:  [name], MD  Cardiologist:  [name], MD   Chief complain: MR  History of Present Illness:  Jeramy Dimmick. is a 61 y.o. male , very pleasant patient with a history of of AAA s/p stent graft in 02/2016, 40 PPY history of tobacco abuse, diagnosed with a left lung mass on PFT and PET scan. 8 x 3.2 cm superior segment left lower lobe lung mass is hypermetabolic and consistent with primary lung neoplasm. No mediastinal or hilar lymphadenopathy and no findings to suggest metastatic disease. 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 First dose 07/03/2016, scheduled till May 2018.  Malignant neoplasm of lower lobe of left lung ([place]) Staging form: Lung, AJCC 8th Edition - Clinical: Stage IIA (cT2b, cN0, cM0) - Signed by [name], MD on 05/15/2016 - Pathologic stage from 05/15/2016: Stage IIA (pT2b, pN0, cM0)  07/13/2016 - the patient states that prior to all of these diagnoses he was working 16 hours a day. 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 He was able to work manually all day without any symptoms of shortness of breath no chest pain no palpitation dizziness no syncope. Now post lung lobectomy and chemotherapy he feels some mild shortness of breath but still able to work full time also denies lower extremity edema orthopnea or proximal nocturnal dyspnea.  Past Medical History:  Diagnosis Date  . AAA (abdominal aortic aneurysm) ([place])   . Cancer ([place])    lung  . Encounter for antineoplastic chemotherapy 06/06/2016  . History of hepatitis B   . HNP (herniated nucleus pulposus), lumbar    L4 with radiculopathy  . Hypertension     . Mass of left lung   . Mitral insufficiency 04/06/2016   This patient will eventually need MV repair if his prognosis is good from oncology standpoint. However, his lung cancer therapy  is the priority right now. His cardiac condition won't preclude possible lung surgery.  Once his lung cancer is under control he will need a TEE to further evaluate his mitral valve anatomy and MR severity, and also have ischemic workup as tere is evidence on calcificati  . Varicose veins of legs     Past Surgical History:  Procedure Laterality Date  . ABDOMINAL AORTIC ENDOVASCULAR STENT GRAFT N/A 03/12/2016   Procedure: ABDOMINAL AORTIC ENDOVASCULAR STENT GRAFT;  Surgeon: Conrad Amsterdam, MD;  Location: [facility];  Service: Vascular;  Laterality: N/A;  . COLONOSCOPY    . INGUINAL HERNIA REPAIR Left 1975   Left inguinal hernia  . INGUINAL HERNIA REPAIR Right   . LOBECTOMY Left 05/11/2016   Procedure: LEFT LOWER LOBE LOBECTOMY AND LEFT UPPER LOBE  RESECTION AND PLACEMENT OF ON-Q;  Surgeon: [name], MD;  Location: [facility];  Service: Thoracic;  Laterality: Left;  . LUMBAR LAMINECTOMY  October 22, 2012  . LYMPH NODE DISSECTION Left 05/11/2016   Procedure: LYMPH NODE DISSECTION;  Surgeon: [name], MD;  Location: [facility];  Service: Thoracic;  Laterality: Left;  . STAPLING OF BLEBS Left 05/11/2016   Procedure: STAPLING OF APICAL BLEB;  Surgeon: [name], MD;  Location: [facility]  OR;  Service: Thoracic;  Laterality: Left;  Marland Kitchen VIDEO ASSISTED THORACOSCOPY Left 05/18/2016   Procedure: VIDEO ASSISTED THORACOSCOPY WITH REMOVAL OF LEFT APICAL BLEB;  Surgeon: [name], MD;  Location: [facility];  Service: Thoracic;  Laterality: Left;  Marland Kitchen VIDEO ASSISTED THORACOSCOPY (VATS)/WEDGE RESECTION Left 05/11/2016   Procedure: LEFT VIDEO ASSISTED THORACOSCOPY (VATS);  Surgeon: [name], MD;  Location: [facility];  Service: Thoracic;  Laterality: Left;  Marland Kitchen VIDEO BRONCHOSCOPY N/A 05/11/2016   Procedure: VIDEO BRONCHOSCOPY,  LEFT LUNG;  Surgeon: [name], MD;  Location: [facility];  Service: Thoracic;  Laterality: N/A;  . VIDEO BRONCHOSCOPY N/A 05/18/2016   Procedure: VIDEO BRONCHOSCOPY WITH BRONCHIAL WASHING;  Surgeon: [name], MD;  Location: [facility];  Service: Thoracic;  Laterality: N/A;    Current Medications: Outpatient Medications Prior to Visit  Medication Sig Dispense Refill  . dexamethasone (DECADRON) 4 MG tablet 4 mg by mouth twice a day the day before, day of and day after the chemotherapy every 3 weeks. 40 tablet 0  . folic acid (FOLVITE) 1 MG tablet Take 1 tablet (1 mg total) by mouth daily. 30 tablet 4  . prochlorperazine (COMPAZINE) 10 MG tablet Take 1 tablet (10 mg total) by mouth every 6 (six) hours as needed for nausea or vomiting. 30 tablet 0  . carvedilol (COREG) 3.125 MG tablet Take 1 tablet by mouth 2 (two) times daily.  1  . losartan (COZAAR) 25 MG tablet Take 25 mg by mouth daily.  1  . oxyCODONE (OXY IR/ROXICODONE) 5 MG immediate release tablet Take 1 tablet (5 mg total) by mouth every 6 (six) hours as needed for severe pain. (Patient not taking: Reported on 07/13/2016) 30 tablet 0   No facility-administered medications prior to visit.      Allergies:   Tramadol   Social History   Social History  . Marital status: Married    Spouse name: N/A  . Number of children: N/A  . Years of education: N/A   Social History Main Topics  . Smoking status: Former Smoker    Packs/day: 1.00    Types: Cigarettes    Quit date: 03/05/2016  . Smokeless tobacco: Never Used  . Alcohol use 7.2 oz/week    12 Cans of beer per week     Comment: none since 02/2016  . 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 Sexual activity: Not Asked   Other Topics Concern  . None   Social History Narrative  . None     Family History:  The patient's family history includes Diabetes in his mother.   ROS:   Please see the history of present illness.    ROS  All other systems reviewed and are negative.   PHYSICAL EXAM:   VS:  BP (!) 146/84   Pulse 75   Ht '6\' 3"'$  (1.905 m)   Wt 147 lb 6.4 oz (66.9 kg)   SpO2 96%   BMI 18.42 kg/m    GEN: Well nourished, well developed, in no acute distress  HEENT: normal  Neck: no JVD, carotid bruits, or masses Cardiac: RRR; 4/6 systolic murmurs, rubs, or gallops,no edema  Respiratory:  clear to auscultation bilaterally, normal work of breathing GI: soft, nontender, nondistended, + BS MS: no deformity or atrophy  Skin: warm and dry, no rash Neuro:  Alert and Oriented x 3, Strength and sensation are intact Psych: euthymic  mood, full affect  Wt Readings from Last 3 Encounters:  07/13/16 147 lb 6.4 oz (66.9 kg)  07/12/16 148 lb 12.8 oz (67.5 kg)  07/09/16 148 lb 11.2 oz (67.4 kg)      Studies/Labs Reviewed:   EKG:  EKG is not ordered today.    Recent Labs: 07/09/2016: ALT 26; BUN 17.8; Creatinine 1.1; HGB 13.6; Magnesium 2.1; Platelets 302; Potassium 4.2; Sodium 136   Lipid Panel No results found for: CHOL, TRIG, HDL, CHOLHDL, VLDL, LDLCALC, LDLDIRECT  Additional studies/ records that were reviewed today include:   TTE: 02/2016 - Left ventricle: The cavity size was normal. Wall thickness was   increased in a pattern of moderate LVH. Systolic function was   normal. The estimated ejection fraction was in the range of 60%   to 65%. - Mitral valve: Anterior leaflet prolapse with likley moderate   eccentric posteriorly directed MR   Anteiror leaflet is very thickend and myxomatous Cannot r/o   parachute MV doubt   vegetation consider TEE if clinically indicated - Atrial septum: No defect or patent foramen ovale was identified.  Cardiopulmonary exercise test.  January 2018 Conclusion:Exercise testing with gas exchange demonstrates normal functional capacity when compared to matched sedentary norms. Patient is primarily ventilatory limited and is clearly of obstructive nature. VE/VCO2 is elevated  and indicates significant dead space ventilation most likely due to obstructive lung disease. Patient's measured VO85mx is low-normal at 22.1 ml/kg/min and indicates relatively little increased risk associated with lung resection.    ASSESSMENT:    1. Mitral valve insufficiency, unspecified etiology   2. Severe mitral regurgitation   3. Mitral valve prolapse   4. S/P partial lobectomy of lung   5. Malignant neoplasm of lower lobe of left lung ([place]      PLAN:  In order of problems listed above:  1. Mitral regurgitation -  echocardiography in 02/2016 showed normal LVEF with myxomatous - anterior flail MV with most probably severe MR - eccentric posterior jet. No pulmonary hypertension. He is minimally symptomatic, we will continue to monitor closely, repeat echocardiogram planned for June 2018. He ultimately needs to finish his treatment for lung cancer and will follow his prognosis. If his symptoms worsen or his MR worsens with a good prognosis in the future from lung cancer standpoint we will plan for a surgery.  2. Lung cancer on systemic chemo with Alimta and cisplatin - we will repeat echo with strain in June 2018, baseline LVEF 60-65%.   Medication Adjustments/Labs and Tests Ordered: Current medicines are reviewed at length with the patient today.  Concerns regarding medicines are outlined above.  Medication changes, Labs and Tests ordered today are listed in the Patient Instructions below. Patient Instructions  Medication Instructions:   Your physician recommends that you continue on your current medications as directed. Please refer to the Current Medication list given to you today.    Testing/Procedures:  Your physician has requested that you have an echocardiogram. Echocardiography is a painless test that uses sound waves to create images of your heart. It provides your doctor with information about the size and shape of your heart and how well your heart's chambers and  valves are working. This procedure takes approximately one hour. There are no restrictions for this procedure.  PLEASE HAVE YOUR ECHO SCHEDULED A WEEK PRIOR TOO SEEING DR Brayli Klingbeil IN 5 MONTHS      Follow-Up:  5 MONTHS WITH DR Nikolaus Pienta---PLEASE HAVE YOUR ECHO DONE A WEEK PRIOR TOO THIS OFFICE  VISIT       If you need a refill on your cardiac medications before your next appointment, please call your pharmacy.      Signed, [name], MD  07/13/2016 10:55 AM    [address], [place], [place]  [address] Phone: [phone]; Fax: [phone]

## 2016-07-13 NOTE — Patient Instructions (Signed)
Medication Instructions:   Your physician recommends that you continue on your current medications as directed. Please refer to the Current Medication list given to you today.    Testing/Procedures:  Your physician has requested that you have an echocardiogram. Echocardiography is a painless test that uses sound waves to create images of your heart. It provides your doctor with information about the size and shape of your heart and how well your heart's chambers and valves are working. This procedure takes approximately one hour. There are no restrictions for this procedure.  PLEASE HAVE YOUR ECHO SCHEDULED A WEEK PRIOR TOO SEEING DR NELSON IN 5 MONTHS      Follow-Up:  5 MONTHS WITH DR NELSON---PLEASE HAVE YOUR ECHO DONE A WEEK PRIOR TOO THIS OFFICE VISIT       If you need a refill on your cardiac medications before your next appointment, please call your pharmacy.

## 2016-07-17 ENCOUNTER — Other Ambulatory Visit (HOSPITAL_BASED_OUTPATIENT_CLINIC_OR_DEPARTMENT_OTHER): Payer: No Typology Code available for payment source

## 2016-07-17 DIAGNOSIS — C3432 Malignant neoplasm of lower lobe, left bronchus or lung: Secondary | ICD-10-CM | POA: Diagnosis not present

## 2016-07-17 LAB — CBC WITH DIFFERENTIAL/PLATELET
BASO%: 0.3 % (ref 0.0–2.0)
Basophils Absolute: 0 10*3/uL (ref 0.0–0.1)
EOS%: 3.7 % (ref 0.0–7.0)
Eosinophils Absolute: 0.3 10*3/uL (ref 0.0–0.5)
HEMATOCRIT: 41.7 % (ref 38.4–49.9)
HGB: 13.8 g/dL (ref 13.0–17.1)
LYMPH#: 1.6 10*3/uL (ref 0.9–3.3)
LYMPH%: 22.7 % (ref 14.0–49.0)
MCH: 30.3 pg (ref 27.2–33.4)
MCHC: 33.1 g/dL (ref 32.0–36.0)
MCV: 91.4 fL (ref 79.3–98.0)
MONO#: 0.6 10*3/uL (ref 0.1–0.9)
MONO%: 8.3 % (ref 0.0–14.0)
NEUT#: 4.7 10*3/uL (ref 1.5–6.5)
NEUT%: 65 % (ref 39.0–75.0)
Platelets: 309 10*3/uL (ref 140–400)
RBC: 4.56 10*6/uL (ref 4.20–5.82)
RDW: 13.9 % (ref 11.0–14.6)
WBC: 7.2 10*3/uL (ref 4.0–10.3)

## 2016-07-17 LAB — COMPREHENSIVE METABOLIC PANEL
ALT: 33 U/L (ref 0–55)
AST: 32 U/L (ref 5–34)
Albumin: 3.8 g/dL (ref 3.5–5.0)
Alkaline Phosphatase: 115 U/L (ref 40–150)
Anion Gap: 10 mEq/L (ref 3–11)
BILIRUBIN TOTAL: 0.33 mg/dL (ref 0.20–1.20)
BUN: 10.2 mg/dL (ref 7.0–26.0)
CHLORIDE: 105 meq/L (ref 98–109)
CO2: 25 mEq/L (ref 22–29)
CREATININE: 0.9 mg/dL (ref 0.7–1.3)
Calcium: 9.8 mg/dL (ref 8.4–10.4)
EGFR: >90 mL/min/{1.73_m2} (ref >90)
Glucose: 126 mg/dl (ref 70–140)
Potassium: 4.5 mEq/L (ref 3.5–5.1)
Sodium: 140 mEq/L (ref 136–145)
Total Protein: 7.3 g/dL (ref 6.4–8.3)

## 2016-07-17 LAB — MAGNESIUM: MAGNESIUM: 1.9 mg/dL (ref 1.5–2.5)

## 2016-07-18 DIAGNOSIS — H6123 Impacted cerumen, bilateral: Secondary | ICD-10-CM | POA: Diagnosis not present

## 2016-07-24 ENCOUNTER — Ambulatory Visit (HOSPITAL_BASED_OUTPATIENT_CLINIC_OR_DEPARTMENT_OTHER): Payer: BLUE CROSS/BLUE SHIELD | Admitting: Internal Medicine

## 2016-07-24 ENCOUNTER — Encounter: Payer: Self-pay | Admitting: Internal Medicine

## 2016-07-24 ENCOUNTER — Other Ambulatory Visit (HOSPITAL_BASED_OUTPATIENT_CLINIC_OR_DEPARTMENT_OTHER): Payer: BLUE CROSS/BLUE SHIELD

## 2016-07-24 ENCOUNTER — Ambulatory Visit (HOSPITAL_BASED_OUTPATIENT_CLINIC_OR_DEPARTMENT_OTHER): Payer: BLUE CROSS/BLUE SHIELD

## 2016-07-24 ENCOUNTER — Telehealth: Payer: Self-pay | Admitting: Internal Medicine

## 2016-07-24 VITALS — BP 152/74 | HR 70 | Temp 98.1°F | Resp 18 | Ht 75.0 in | Wt 152.7 lb

## 2016-07-24 DIAGNOSIS — C3412 Malignant neoplasm of upper lobe, left bronchus or lung: Secondary | ICD-10-CM

## 2016-07-24 DIAGNOSIS — C3432 Malignant neoplasm of lower lobe, left bronchus or lung: Secondary | ICD-10-CM

## 2016-07-24 DIAGNOSIS — Z5111 Encounter for antineoplastic chemotherapy: Secondary | ICD-10-CM | POA: Diagnosis not present

## 2016-07-24 LAB — COMPREHENSIVE METABOLIC PANEL
ALBUMIN: 4 g/dL (ref 3.5–5.0)
ALT: 35 U/L (ref 0–55)
ANION GAP: 11 meq/L (ref 3–11)
AST: 26 U/L (ref 5–34)
Alkaline Phosphatase: 106 U/L (ref 40–150)
BUN: 10.3 mg/dL (ref 7.0–26.0)
CALCIUM: 10 mg/dL (ref 8.4–10.4)
CO2: 22 mEq/L (ref 22–29)
Chloride: 105 mEq/L (ref 98–109)
Creatinine: 0.8 mg/dL (ref 0.7–1.3)
GLUCOSE: 225 mg/dL — AB (ref 70–140)
Potassium: 4 mEq/L (ref 3.5–5.1)
SODIUM: 137 meq/L (ref 136–145)
Total Bilirubin: <0.22 mg/dL (ref 0.20–1.20)
Total Protein: 7.4 g/dL (ref 6.4–8.3)

## 2016-07-24 LAB — CBC WITH DIFFERENTIAL/PLATELET
BASO%: 0.3 % (ref 0.0–2.0)
BASOS ABS: 0 10*3/uL (ref 0.0–0.1)
EOS ABS: 0 10*3/uL (ref 0.0–0.5)
EOS%: 0 % (ref 0.0–7.0)
HCT: 38.9 % (ref 38.4–49.9)
HEMOGLOBIN: 13.3 g/dL (ref 13.0–17.1)
LYMPH%: 17.8 % (ref 14.0–49.0)
MCH: 31.5 pg (ref 27.2–33.4)
MCHC: 34.2 g/dL (ref 32.0–36.0)
MCV: 92 fL (ref 79.3–98.0)
MONO#: 0.7 10*3/uL (ref 0.1–0.9)
MONO%: 7.5 % (ref 0.0–14.0)
NEUT#: 6.5 10*3/uL (ref 1.5–6.5)
NEUT%: 74.4 % (ref 39.0–75.0)
PLATELETS: 422 10*3/uL — AB (ref 140–400)
RBC: 4.23 10*6/uL (ref 4.20–5.82)
RDW: 14.5 % (ref 11.0–14.6)
WBC: 8.8 10*3/uL (ref 4.0–10.3)
lymph#: 1.6 10*3/uL (ref 0.9–3.3)

## 2016-07-24 LAB — MAGNESIUM: Magnesium: 1.9 mg/dl (ref 1.5–2.5)

## 2016-07-24 MED ORDER — PALONOSETRON HCL INJECTION 0.25 MG/5ML
0.2500 mg | Freq: Once | INTRAVENOUS | Status: AC
  Administered 2016-07-24: 0.25 mg via INTRAVENOUS

## 2016-07-24 MED ORDER — POTASSIUM CHLORIDE 2 MEQ/ML IV SOLN
Freq: Once | INTRAVENOUS | Status: AC
  Administered 2016-07-24: 11:00:00 via INTRAVENOUS
  Filled 2016-07-24: qty 10

## 2016-07-24 MED ORDER — SODIUM CHLORIDE 0.9 % IV SOLN
Freq: Once | INTRAVENOUS | Status: AC
  Administered 2016-07-24: 10:00:00 via INTRAVENOUS

## 2016-07-24 MED ORDER — SODIUM CHLORIDE 0.9 % IV SOLN
75.0000 mg/m2 | Freq: Once | INTRAVENOUS | Status: AC
  Administered 2016-07-24: 140 mg via INTRAVENOUS
  Filled 2016-07-24: qty 140

## 2016-07-24 MED ORDER — PEMETREXED DISODIUM CHEMO INJECTION 500 MG
490.0000 mg/m2 | Freq: Once | INTRAVENOUS | Status: AC
  Administered 2016-07-24: 900 mg via INTRAVENOUS
  Filled 2016-07-24: qty 20

## 2016-07-24 MED ORDER — SODIUM CHLORIDE 0.9 % IV SOLN
Freq: Once | INTRAVENOUS | Status: AC
  Administered 2016-07-24: 12:00:00 via INTRAVENOUS
  Filled 2016-07-24: qty 5

## 2016-07-24 MED ORDER — PALONOSETRON HCL INJECTION 0.25 MG/5ML
INTRAVENOUS | Status: AC
  Filled 2016-07-24: qty 5

## 2016-07-24 NOTE — Telephone Encounter (Signed)
Per 07/24/2016 - no additional appts needed - already scheduled appts to 5/15

## 2016-07-24 NOTE — Patient Instructions (Signed)
New Site Discharge Instructions for Patients Receiving Chemotherapy  Today you received the following chemotherapy agents: Alimta and Cisplatin.  To help prevent nausea and vomiting after your treatment, we encourage you to take your nausea medication: Compazine. Take one every 6 hours as needed.   If you develop nausea and vomiting that is not controlled by your nausea medication, call the clinic.   BELOW ARE SYMPTOMS THAT SHOULD BE REPORTED IMMEDIATELY:  *FEVER GREATER THAN 100.5 F  *CHILLS WITH OR WITHOUT FEVER  NAUSEA AND VOMITING THAT IS NOT CONTROLLED WITH YOUR NAUSEA MEDICATION  *UNUSUAL SHORTNESS OF BREATH  *UNUSUAL BRUISING OR BLEEDING  TENDERNESS IN MOUTH AND THROAT WITH OR WITHOUT PRESENCE OF ULCERS  *URINARY PROBLEMS  *BOWEL PROBLEMS  UNUSUAL RASH Items with * indicate a potential emergency and should be followed up as soon as possible.  Feel free to call the clinic should you have any questions or concerns. The clinic phone number is [phone].  Please show the [facility] at check-in to the Emergency Department and triage nurse.

## 2016-07-24 NOTE — Progress Notes (Signed)
[facility] [phone]   [phone]  OFFICE PROGRESS NOTE  [name], MD [place] [address]  DIAGNOSIS: stage IB (T2a, N0, M0) non-small cell lung cancer, adenocarcinoma diagnosed in December 2017.  PRIOR THERAPY: status post left lower lobectomy as well as wedge resection of the left upper lobe on 05/11/2016.  CURRENT THERAPY: Adjuvant systemic chemotherapy with cisplatin 75 MG/M2 and Alimta 500 MG/M2 every 3 weeks. First dose 07/03/2016. Status post one cycle.  INTERVAL HISTORY: William Kales [name] 61 y.o. male returns to the clinic today for follow-up visit accompanied by his wife. The patient tolerated the first cycle of his adjuvant systemic chemotherapy with cisplatin and Alimta fairly well with no significant adverse effects. He denied having any nausea or vomiting. He has no chest pain, shortness of breath, cough or hemoptysis. He denied having any fever or chills. He has no significant weight loss or night sweats. He is here today for evaluation before starting cycle #2.  MEDICAL HISTORY: Past Medical History:  Diagnosis Date  . AAA (abdominal aortic aneurysm) ([place])   . Cancer ([facility])    lung  . Encounter for antineoplastic chemotherapy 06/06/2016  . History of hepatitis B   . HNP (herniated nucleus pulposus), lumbar    L4 with radiculopathy  . Hypertension   . Mass of left lung   . Mitral insufficiency 04/06/2016   This patient will eventually need MV repair if his prognosis is good from oncology standpoint. However, his lung cancer therapy  is the priority right now. His cardiac condition won't preclude possible lung surgery.  Once his lung cancer is under control he will need a TEE to further evaluate his mitral valve anatomy and MR severity, and also have ischemic workup as tere is evidence on calcificati  . Varicose veins of legs     ALLERGIES:  is allergic to tramadol.  MEDICATIONS:  Current  Outpatient Prescriptions  Medication Sig Dispense Refill  . dexamethasone (DECADRON) 4 MG tablet 4 mg by mouth twice a day the day before, day of and day after the chemotherapy every 3 weeks. 40 tablet 0  . folic acid (FOLVITE) 1 MG tablet Take 1 tablet (1 mg total) by mouth daily. 30 tablet 4  . prochlorperazine (COMPAZINE) 10 MG tablet Take 1 tablet (10 mg total) by mouth every 6 (six) hours as needed for nausea or vomiting. 30 tablet 0   No current facility-administered medications for this visit.     SURGICAL HISTORY:  Past Surgical History:  Procedure Laterality Date  . ABDOMINAL AORTIC ENDOVASCULAR STENT GRAFT N/A 03/12/2016   Procedure: ABDOMINAL AORTIC ENDOVASCULAR STENT GRAFT;  Surgeon: Conrad Florence, MD;  Location: [facility];  Service: Vascular;  Laterality: N/A;  . COLONOSCOPY    . INGUINAL HERNIA REPAIR Left 1975   Left inguinal hernia  . INGUINAL HERNIA REPAIR Right   . LOBECTOMY Left 05/11/2016   Procedure: LEFT LOWER LOBE LOBECTOMY AND LEFT UPPER LOBE  RESECTION AND PLACEMENT OF ON-Q;  Surgeon: [name], MD;  Location: [facility];  Service: Thoracic;  Laterality: Left;  . LUMBAR LAMINECTOMY  October 22, 2012  . LYMPH NODE DISSECTION Left 05/11/2016   Procedure: LYMPH NODE DISSECTION;  Surgeon: [name], MD;  Location: [facility];  Service: Thoracic;  Laterality: Left;  . STAPLING OF BLEBS Left 05/11/2016   Procedure: STAPLING OF APICAL BLEB;  Surgeon: [name], MD;  Location: [facility];  Service: Thoracic;  Laterality: Left;  Marland Kitchen VIDEO ASSISTED THORACOSCOPY Left 05/18/2016   Procedure: VIDEO ASSISTED THORACOSCOPY WITH REMOVAL OF LEFT APICAL BLEB;  Surgeon: [name], MD;  Location: [facility];  Service: Thoracic;  Laterality: Left;  Marland Kitchen VIDEO ASSISTED THORACOSCOPY (VATS)/WEDGE RESECTION Left 05/11/2016   Procedure: LEFT VIDEO ASSISTED THORACOSCOPY (VATS);  Surgeon: [name], MD;  Location: [facility];  Service: Thoracic;  Laterality: Left;  Marland Kitchen VIDEO BRONCHOSCOPY N/A  05/11/2016   Procedure: VIDEO BRONCHOSCOPY, LEFT LUNG;  Surgeon: [name], MD;  Location: [facility];  Service: Thoracic;  Laterality: N/A;  . VIDEO BRONCHOSCOPY N/A 05/18/2016   Procedure: VIDEO BRONCHOSCOPY WITH BRONCHIAL WASHING;  Surgeon: [name], MD;  Location: [facility];  Service: Thoracic;  Laterality: N/A;    REVIEW OF SYSTEMS:  A comprehensive review of systems was negative.   PHYSICAL EXAMINATION: General appearance: alert, cooperative and no distress Head: Normocephalic, without obvious abnormality, atraumatic Neck: no adenopathy, no JVD, supple, symmetrical, trachea midline and thyroid not enlarged, symmetric, no tenderness/mass/nodules Lymph nodes: Cervical, supraclavicular, and axillary nodes normal. Resp: clear to auscultation bilaterally Back: symmetric, no curvature. ROM normal. No CVA tenderness. Cardio: regular rate and rhythm, S1, S2 normal, no murmur, click, rub or gallop GI: soft, non-tender; bowel sounds normal; no masses,  no organomegaly Extremities: extremities normal, atraumatic, no cyanosis or edema  ECOG PERFORMANCE STATUS: 0 - Asymptomatic  Blood pressure (!) 152/74, pulse 70, temperature 98.1 F (36.7 C), temperature source Oral, resp. rate 18, height '6\' 3"'$  (1.905 m), weight 152 lb 11.2 oz (69.3 kg), SpO2 99 %.  LABORATORY DATA: Lab Results  Component Value Date   WBC 8.8 07/24/2016   HGB 13.3 07/24/2016   HCT 38.9 07/24/2016   MCV 92.0 07/24/2016   PLT 422 (H) 07/24/2016      Chemistry      Component Value Date/Time   NA 137 07/24/2016 0902   K 4.0 07/24/2016 0902   CL 95 (L) 05/20/2016 0400   CO2 22 07/24/2016 0902   BUN 10.3 07/24/2016 0902   CREATININE 0.8 07/24/2016 0902      Component Value Date/Time   CALCIUM 10.0 07/24/2016 0902   ALKPHOS 106 07/24/2016 0902   AST 26 07/24/2016 0902   ALT 35 07/24/2016 0902   BILITOT <0.22 07/24/2016 0902       RADIOGRAPHIC STUDIES: Dg Chest 2 View  Result Date: 07/12/2016 CLINICAL  DATA:  61 year old male with history of lung cancer status post left-sided the a TIS procedure on 05/18/2016 presenting with left-sided chest pain today. EXAM: CHEST  2 VIEW COMPARISON:  Chest x-ray 06/14/2016. FINDINGS: Postoperative changes of left lower lobectomy are redemonstrated. Compensatory hyperexpansion of the left upper lobe is noted. The continues to be increased density projecting over the mid thoracic spine noted on the lateral projection, likely to reflect some posteriorly loculated left-sided pleural fluid. Lungs are otherwise clear. No definite right pleural effusion. No pneumothorax. Emphysematous changes are again noted. Apical thickening on the left may represent some additional loculated fluid or pleural thickening. No evidence of pulmonary edema. Heart size is normal. Upper mediastinal contours are within normal limits. Aortic atherosclerosis. IMPRESSION: 1. Evolving postoperative changes in the left hemithorax, similar to the prior study, with persistent loculated pleural fluid in the posterior aspect of the mid and upper left hemithorax. 2. Aortic atherosclerosis. 3. Emphysema. Electronically Signed   By: Vinnie Langton M.D.   On: 07/12/2016 10:26    ASSESSMENT AND PLAN:  This  is a very pleasant 61 years old white male with a stage IB non-small cell lung cancer, adenocarcinoma status post left lower lobectomy as well as wedge resection of the left upper lobe. He is currently undergoing treatment with adjuvant systemic chemotherapy with cisplatin and Alimta status post 1 cycle and tolerated the first cycle well. I recommended for the patient to proceed with cycle #2 today as a scheduled. I will see him back for follow-up visit in 3 weeks for evaluation before starting cycle #3. He was advised to call immediately if he has any concerning symptoms in the interval. The patient voices understanding of current disease status and treatment options and is in agreement with the current care  plan. All questions were answered. The patient knows to call the clinic with any problems, questions or concerns. We can certainly see the patient much sooner if necessary. I spent 10 minutes counseling the patient face to face. The total time spent in the appointment was 15 minutes.  Disclaimer: This note was dictated with voice recognition software. Similar sounding words can inadvertently be transcribed and may not be corrected upon review.

## 2016-07-31 ENCOUNTER — Other Ambulatory Visit (HOSPITAL_BASED_OUTPATIENT_CLINIC_OR_DEPARTMENT_OTHER): Payer: No Typology Code available for payment source

## 2016-07-31 DIAGNOSIS — C3412 Malignant neoplasm of upper lobe, left bronchus or lung: Secondary | ICD-10-CM | POA: Diagnosis not present

## 2016-07-31 DIAGNOSIS — C3432 Malignant neoplasm of lower lobe, left bronchus or lung: Secondary | ICD-10-CM | POA: Diagnosis not present

## 2016-07-31 LAB — COMPREHENSIVE METABOLIC PANEL
ALBUMIN: 3.8 g/dL (ref 3.5–5.0)
ALK PHOS: 100 U/L (ref 40–150)
ALT: 42 U/L (ref 0–55)
AST: 36 U/L — AB (ref 5–34)
Anion Gap: 10 mEq/L (ref 3–11)
BILIRUBIN TOTAL: 0.28 mg/dL (ref 0.20–1.20)
BUN: 18.8 mg/dL (ref 7.0–26.0)
CO2: 26 mEq/L (ref 22–29)
CREATININE: 0.9 mg/dL (ref 0.7–1.3)
Calcium: 9.9 mg/dL (ref 8.4–10.4)
Chloride: 105 mEq/L (ref 98–109)
GLUCOSE: 95 mg/dL (ref 70–140)
Potassium: 4.5 mEq/L (ref 3.5–5.1)
Sodium: 141 mEq/L (ref 136–145)
Total Protein: 7 g/dL (ref 6.4–8.3)

## 2016-07-31 LAB — CBC WITH DIFFERENTIAL/PLATELET
BASO%: 0.2 % (ref 0.0–2.0)
Basophils Absolute: 0 10*3/uL (ref 0.0–0.1)
EOS%: 2.7 % (ref 0.0–7.0)
Eosinophils Absolute: 0.2 10*3/uL (ref 0.0–0.5)
HEMATOCRIT: 39.3 % (ref 38.4–49.9)
HGB: 13.4 g/dL (ref 13.0–17.1)
LYMPH#: 1.8 10*3/uL (ref 0.9–3.3)
LYMPH%: 30.4 % (ref 14.0–49.0)
MCH: 30.8 pg (ref 27.2–33.4)
MCHC: 34.1 g/dL (ref 32.0–36.0)
MCV: 90.3 fL (ref 79.3–98.0)
MONO#: 0.7 10*3/uL (ref 0.1–0.9)
MONO%: 12.2 % (ref 0.0–14.0)
NEUT#: 3.2 10*3/uL (ref 1.5–6.5)
NEUT%: 54.5 % (ref 39.0–75.0)
Platelets: 239 10*3/uL (ref 140–400)
RBC: 4.35 10*6/uL (ref 4.20–5.82)
RDW: 14 % (ref 11.0–14.6)
WBC: 5.8 10*3/uL (ref 4.0–10.3)

## 2016-07-31 LAB — MAGNESIUM: MAGNESIUM: 1.7 mg/dL (ref 1.5–2.5)

## 2016-08-07 ENCOUNTER — Other Ambulatory Visit (HOSPITAL_BASED_OUTPATIENT_CLINIC_OR_DEPARTMENT_OTHER): Payer: No Typology Code available for payment source

## 2016-08-07 DIAGNOSIS — C3432 Malignant neoplasm of lower lobe, left bronchus or lung: Secondary | ICD-10-CM | POA: Diagnosis not present

## 2016-08-07 LAB — COMPREHENSIVE METABOLIC PANEL
ALBUMIN: 3.7 g/dL (ref 3.5–5.0)
ALK PHOS: 105 U/L (ref 40–150)
ALT: 45 U/L (ref 0–55)
AST: 35 U/L — AB (ref 5–34)
Anion Gap: 8 mEq/L (ref 3–11)
BUN: 13 mg/dL (ref 7.0–26.0)
CALCIUM: 9.6 mg/dL (ref 8.4–10.4)
CO2: 26 mEq/L (ref 22–29)
Chloride: 106 mEq/L (ref 98–109)
Creatinine: 1 mg/dL (ref 0.7–1.3)
Glucose: 63 mg/dl — ABNORMAL LOW (ref 70–140)
POTASSIUM: 4.8 meq/L (ref 3.5–5.1)
Sodium: 140 mEq/L (ref 136–145)
Total Bilirubin: 0.33 mg/dL (ref 0.20–1.20)
Total Protein: 6.9 g/dL (ref 6.4–8.3)

## 2016-08-07 LAB — CBC WITH DIFFERENTIAL/PLATELET
BASO%: 0.6 % (ref 0.0–2.0)
BASOS ABS: 0 10*3/uL (ref 0.0–0.1)
EOS ABS: 0.2 10*3/uL (ref 0.0–0.5)
EOS%: 2.9 % (ref 0.0–7.0)
HEMATOCRIT: 39.7 % (ref 38.4–49.9)
HEMOGLOBIN: 13.4 g/dL (ref 13.0–17.1)
LYMPH#: 1.8 10*3/uL (ref 0.9–3.3)
LYMPH%: 23.5 % (ref 14.0–49.0)
MCH: 30.7 pg (ref 27.2–33.4)
MCHC: 33.7 g/dL (ref 32.0–36.0)
MCV: 91.2 fL (ref 79.3–98.0)
MONO#: 0.9 10*3/uL (ref 0.1–0.9)
MONO%: 11.7 % (ref 0.0–14.0)
NEUT#: 4.7 10*3/uL (ref 1.5–6.5)
NEUT%: 61.3 % (ref 39.0–75.0)
Platelets: 218 10*3/uL (ref 140–400)
RBC: 4.36 10*6/uL (ref 4.20–5.82)
RDW: 14.6 % (ref 11.0–14.6)
WBC: 7.7 10*3/uL (ref 4.0–10.3)

## 2016-08-07 LAB — MAGNESIUM: Magnesium: 1.9 mg/dl (ref 1.5–2.5)

## 2016-08-14 ENCOUNTER — Encounter: Payer: Self-pay | Admitting: Internal Medicine

## 2016-08-14 ENCOUNTER — Telehealth: Payer: Self-pay | Admitting: Internal Medicine

## 2016-08-14 ENCOUNTER — Other Ambulatory Visit (HOSPITAL_BASED_OUTPATIENT_CLINIC_OR_DEPARTMENT_OTHER): Payer: BLUE CROSS/BLUE SHIELD

## 2016-08-14 ENCOUNTER — Ambulatory Visit (HOSPITAL_BASED_OUTPATIENT_CLINIC_OR_DEPARTMENT_OTHER): Payer: BLUE CROSS/BLUE SHIELD | Admitting: Internal Medicine

## 2016-08-14 ENCOUNTER — Ambulatory Visit (HOSPITAL_BASED_OUTPATIENT_CLINIC_OR_DEPARTMENT_OTHER): Payer: BLUE CROSS/BLUE SHIELD

## 2016-08-14 VITALS — BP 159/85 | HR 73 | Resp 16

## 2016-08-14 VITALS — BP 161/83 | HR 72 | Temp 98.2°F | Resp 18 | Ht 75.0 in | Wt 157.1 lb

## 2016-08-14 DIAGNOSIS — C3432 Malignant neoplasm of lower lobe, left bronchus or lung: Secondary | ICD-10-CM

## 2016-08-14 DIAGNOSIS — D6481 Anemia due to antineoplastic chemotherapy: Secondary | ICD-10-CM

## 2016-08-14 DIAGNOSIS — Z5111 Encounter for antineoplastic chemotherapy: Secondary | ICD-10-CM

## 2016-08-14 DIAGNOSIS — I10 Essential (primary) hypertension: Secondary | ICD-10-CM

## 2016-08-14 DIAGNOSIS — C3431 Malignant neoplasm of lower lobe, right bronchus or lung: Secondary | ICD-10-CM

## 2016-08-14 LAB — CBC WITH DIFFERENTIAL/PLATELET
BASO%: 0 % (ref 0.0–2.0)
Basophils Absolute: 0 10*3/uL (ref 0.0–0.1)
EOS ABS: 0 10*3/uL (ref 0.0–0.5)
EOS%: 0 % (ref 0.0–7.0)
HCT: 37.5 % — ABNORMAL LOW (ref 38.4–49.9)
HEMOGLOBIN: 12.7 g/dL — AB (ref 13.0–17.1)
LYMPH%: 13.5 % — ABNORMAL LOW (ref 14.0–49.0)
MCH: 30.4 pg (ref 27.2–33.4)
MCHC: 33.9 g/dL (ref 32.0–36.0)
MCV: 89.7 fL (ref 79.3–98.0)
MONO#: 0.6 10*3/uL (ref 0.1–0.9)
MONO%: 7.5 % (ref 0.0–14.0)
NEUT%: 79 % — ABNORMAL HIGH (ref 39.0–75.0)
NEUTROS ABS: 6.5 10*3/uL (ref 1.5–6.5)
PLATELETS: 350 10*3/uL (ref 140–400)
RBC: 4.18 10*6/uL — AB (ref 4.20–5.82)
RDW: 14.5 % (ref 11.0–14.6)
WBC: 8.2 10*3/uL (ref 4.0–10.3)
lymph#: 1.1 10*3/uL (ref 0.9–3.3)

## 2016-08-14 LAB — COMPREHENSIVE METABOLIC PANEL
ALBUMIN: 4 g/dL (ref 3.5–5.0)
ALK PHOS: 108 U/L (ref 40–150)
ALT: 36 U/L (ref 0–55)
AST: 31 U/L (ref 5–34)
Anion Gap: 11 mEq/L (ref 3–11)
BILIRUBIN TOTAL: 0.37 mg/dL (ref 0.20–1.20)
BUN: 11.6 mg/dL (ref 7.0–26.0)
CO2: 21 meq/L — AB (ref 22–29)
CREATININE: 0.9 mg/dL (ref 0.7–1.3)
Calcium: 10 mg/dL (ref 8.4–10.4)
Chloride: 105 mEq/L (ref 98–109)
GLUCOSE: 142 mg/dL — AB (ref 70–140)
Potassium: 4.3 mEq/L (ref 3.5–5.1)
SODIUM: 137 meq/L (ref 136–145)
TOTAL PROTEIN: 7.4 g/dL (ref 6.4–8.3)

## 2016-08-14 LAB — MAGNESIUM: MAGNESIUM: 1.8 mg/dL (ref 1.5–2.5)

## 2016-08-14 MED ORDER — SODIUM CHLORIDE 0.9% FLUSH
10.0000 mL | INTRAVENOUS | Status: DC | PRN
  Filled 2016-08-14: qty 10

## 2016-08-14 MED ORDER — SODIUM CHLORIDE 0.9 % IV SOLN
Freq: Once | INTRAVENOUS | Status: AC
  Administered 2016-08-14: 11:00:00 via INTRAVENOUS

## 2016-08-14 MED ORDER — HEPARIN SOD (PORK) LOCK FLUSH 100 UNIT/ML IV SOLN
500.0000 [IU] | Freq: Once | INTRAVENOUS | Status: DC | PRN
  Filled 2016-08-14: qty 5

## 2016-08-14 MED ORDER — SODIUM CHLORIDE 0.9 % IV SOLN
75.0000 mg/m2 | Freq: Once | INTRAVENOUS | Status: AC
  Administered 2016-08-14: 140 mg via INTRAVENOUS
  Filled 2016-08-14: qty 140

## 2016-08-14 MED ORDER — CYANOCOBALAMIN 1000 MCG/ML IJ SOLN
1000.0000 ug | Freq: Once | INTRAMUSCULAR | Status: AC
  Administered 2016-08-14: 1000 ug via INTRAMUSCULAR

## 2016-08-14 MED ORDER — SODIUM CHLORIDE 0.9 % IV SOLN
490.0000 mg/m2 | Freq: Once | INTRAVENOUS | Status: AC
  Administered 2016-08-14: 900 mg via INTRAVENOUS
  Filled 2016-08-14: qty 20

## 2016-08-14 MED ORDER — POTASSIUM CHLORIDE 2 MEQ/ML IV SOLN
Freq: Once | INTRAVENOUS | Status: AC
  Administered 2016-08-14: 11:00:00 via INTRAVENOUS
  Filled 2016-08-14: qty 10

## 2016-08-14 MED ORDER — CYANOCOBALAMIN 1000 MCG/ML IJ SOLN
INTRAMUSCULAR | Status: AC
  Filled 2016-08-14: qty 1

## 2016-08-14 MED ORDER — PALONOSETRON HCL INJECTION 0.25 MG/5ML
INTRAVENOUS | Status: AC
  Filled 2016-08-14: qty 5

## 2016-08-14 MED ORDER — PALONOSETRON HCL INJECTION 0.25 MG/5ML
0.2500 mg | Freq: Once | INTRAVENOUS | Status: AC
  Administered 2016-08-14: 0.25 mg via INTRAVENOUS

## 2016-08-14 MED ORDER — SODIUM CHLORIDE 0.9 % IV SOLN
Freq: Once | INTRAVENOUS | Status: AC
  Administered 2016-08-14: 13:00:00 via INTRAVENOUS
  Filled 2016-08-14: qty 5

## 2016-08-14 NOTE — Patient Instructions (Signed)
[facility] Discharge Instructions for Patients Receiving Chemotherapy  Today you received the following chemotherapy agents:  Alimta and Cisplatin.  To help prevent nausea and vomiting after your treatment, we encourage you to take your nausea medication as directed.   If you develop nausea and vomiting that is not controlled by your nausea medication, call the clinic.   BELOW ARE SYMPTOMS THAT SHOULD BE REPORTED IMMEDIATELY:  *FEVER GREATER THAN 100.5 F  *CHILLS WITH OR WITHOUT FEVER  NAUSEA AND VOMITING THAT IS NOT CONTROLLED WITH YOUR NAUSEA MEDICATION  *UNUSUAL SHORTNESS OF BREATH  *UNUSUAL BRUISING OR BLEEDING  TENDERNESS IN MOUTH AND THROAT WITH OR WITHOUT PRESENCE OF ULCERS  *URINARY PROBLEMS  *BOWEL PROBLEMS  UNUSUAL RASH Items with * indicate a potential emergency and should be followed up as soon as possible.  Feel free to call the clinic you have any questions or concerns. The clinic phone number is [phone].  Please show the [facility] at check-in to the Emergency Department and triage nurse.

## 2016-08-14 NOTE — Progress Notes (Signed)
[facility] Telephone:(336) [phone]   Fax:(336) [phone]  OFFICE PROGRESS NOTE  [name], MD [place] [address]  DIAGNOSIS: stage IB (T2a, N0, M0) non-small cell lung cancer, adenocarcinoma diagnosed in December 2017.  PRIOR THERAPY: status post left lower lobectomy as well as wedge resection of the left upper lobe on 05/11/2016.  CURRENT THERAPY: Adjuvant systemic chemotherapy with cisplatin 75 MG/M2 and Alimta 500 MG/M2 every 3 weeks. First dose 07/03/2016. Status post 2 cycles.  INTERVAL HISTORY: William Kales [name] 61 y.o. male returns to the clinic today for follow-up visit accompanied by his wife. The patient tolerated the last cycle of his treatment with cisplatin and Alimta fairly well except for mild fatigue and swelling of the parotid glands bilaterally a few days after the treatment. It resolved spontaneously few days later. He denied having any chest pain, shortness of breath, cough or hemoptysis. He denied having any fever or chills. He has no nausea, vomiting, diarrhea or constipation. He is here today for evaluation before starting cycle #3.  MEDICAL HISTORY: Past Medical History:  Diagnosis Date  . AAA (abdominal aortic aneurysm) ([facility])   . Cancer ([facility])    lung  . Encounter for antineoplastic chemotherapy 06/06/2016  . History of hepatitis B   . HNP (herniated nucleus pulposus), lumbar    L4 with radiculopathy  . Hypertension   . Mass of left lung   . Mitral insufficiency 04/06/2016   This patient will eventually need MV repair if his prognosis is good from oncology standpoint. However, his lung cancer therapy  is the priority right now. His cardiac condition won't preclude possible lung surgery.  Once his lung cancer is under control he will need a TEE to further evaluate his mitral valve anatomy and MR severity, and also have ischemic workup as tere is evidence on calcificati  . Varicose veins of legs     ALLERGIES:   is allergic to tramadol.  MEDICATIONS:  Current Outpatient Prescriptions  Medication Sig Dispense Refill  . dexamethasone (DECADRON) 4 MG tablet 4 mg by mouth twice a day the day before, day of and day after the chemotherapy every 3 weeks. 40 tablet 0  . folic acid (FOLVITE) 1 MG tablet Take 1 tablet (1 mg total) by mouth daily. 30 tablet 4  . prochlorperazine (COMPAZINE) 10 MG tablet Take 1 tablet (10 mg total) by mouth every 6 (six) hours as needed for nausea or vomiting. (Patient not taking: Reported on 08/14/2016) 30 tablet 0   No current facility-administered medications for this visit.     SURGICAL HISTORY:  Past Surgical History:  Procedure Laterality Date  . ABDOMINAL AORTIC ENDOVASCULAR STENT GRAFT N/A 03/12/2016   Procedure: ABDOMINAL AORTIC ENDOVASCULAR STENT GRAFT;  Surgeon: Conrad Bradford, MD;  Location: [facility];  Service: Vascular;  Laterality: N/A;  . COLONOSCOPY    . INGUINAL HERNIA REPAIR Left 1975   Left inguinal hernia  . INGUINAL HERNIA REPAIR Right   . LOBECTOMY Left 05/11/2016   Procedure: LEFT LOWER LOBE LOBECTOMY AND LEFT UPPER LOBE  RESECTION AND PLACEMENT OF ON-Q;  Surgeon: [name], MD;  Location: [facility];  Service: Thoracic;  Laterality: Left;  . LUMBAR LAMINECTOMY  October 22, 2012  . LYMPH NODE DISSECTION Left 05/11/2016   Procedure: LYMPH NODE DISSECTION;  Surgeon: [name], MD;  Location: [facility];  Service: Thoracic;  Laterality: Left;  . STAPLING OF BLEBS Left 05/11/2016  Procedure: STAPLING OF APICAL BLEB;  Surgeon: [name], MD;  Location: [facility];  Service: Thoracic;  Laterality: Left;  Marland Kitchen VIDEO ASSISTED THORACOSCOPY Left 05/18/2016   Procedure: VIDEO ASSISTED THORACOSCOPY WITH REMOVAL OF LEFT APICAL BLEB;  Surgeon: [name], MD;  Location: [facility];  Service: Thoracic;  Laterality: Left;  Marland Kitchen VIDEO ASSISTED THORACOSCOPY (VATS)/WEDGE RESECTION Left 05/11/2016   Procedure: LEFT VIDEO ASSISTED THORACOSCOPY (VATS);  Surgeon: [name], MD;  Location: [facility];  Service: Thoracic;  Laterality: Left;  Marland Kitchen VIDEO BRONCHOSCOPY N/A 05/11/2016   Procedure: VIDEO BRONCHOSCOPY, LEFT LUNG;  Surgeon: [name], MD;  Location: [facility];  Service: Thoracic;  Laterality: N/A;  . VIDEO BRONCHOSCOPY N/A 05/18/2016   Procedure: VIDEO BRONCHOSCOPY WITH BRONCHIAL WASHING;  Surgeon: [name], MD;  Location: [facility];  Service: Thoracic;  Laterality: N/A;    REVIEW OF SYSTEMS:  Constitutional: positive for fatigue Eyes: negative Ears, nose, mouth, throat, and face: negative Respiratory: negative Cardiovascular: negative Gastrointestinal: negative Genitourinary:negative Integument/breast: negative Hematologic/lymphatic: negative Musculoskeletal:negative Neurological: negative Behavioral/Psych: negative Endocrine: negative Allergic/Immunologic: negative   PHYSICAL EXAMINATION: General appearance: alert, cooperative and no distress Head: Normocephalic, without obvious abnormality, atraumatic Neck: no adenopathy, no JVD, supple, symmetrical, trachea midline and thyroid not enlarged, symmetric, no tenderness/mass/nodules Lymph nodes: Cervical, supraclavicular, and axillary nodes normal. Resp: clear to auscultation bilaterally Back: symmetric, no curvature. ROM normal. No CVA tenderness. Cardio: regular rate and rhythm, S1, S2 normal, no murmur, click, rub or gallop GI: soft, non-tender; bowel sounds normal; no masses,  no organomegaly Extremities: extremities normal, atraumatic, no cyanosis or edema Neurologic: Alert and oriented X 3, normal strength and tone. Normal symmetric reflexes. Normal coordination and gait  ECOG PERFORMANCE STATUS: 1 - Symptomatic but completely ambulatory  Blood pressure (!) 161/83, pulse 72, temperature 98.2 F (36.8 C), temperature source Oral, resp. rate 18, height '6\' 3"'$  (1.905 m), weight 157 lb 1.6 oz (71.3 kg), SpO2 97 %.  LABORATORY DATA: Lab Results  Component Value Date   WBC 8.2  08/14/2016   HGB 12.7 (L) 08/14/2016   HCT 37.5 (L) 08/14/2016   MCV 89.7 08/14/2016   PLT 350 08/14/2016      Chemistry      Component Value Date/Time   NA 140 08/07/2016 0855   K 4.8 08/07/2016 0855   CL 95 (L) 05/20/2016 0400   CO2 26 08/07/2016 0855   BUN 13.0 08/07/2016 0855   CREATININE 1.0 08/07/2016 0855      Component Value Date/Time   CALCIUM 9.6 08/07/2016 0855   ALKPHOS 105 08/07/2016 0855   AST 35 (H) 08/07/2016 0855   ALT 45 08/07/2016 0855   BILITOT 0.33 08/07/2016 0855       RADIOGRAPHIC STUDIES: No results found.  ASSESSMENT AND PLAN:  This is a very pleasant 61 years old white male with a stage IB non-small cell lung cancer, adenocarcinoma status post left lower lobectomy as well as wedge resection of the left upper lobe. The patient is currently undergoing treatment with adjuvant systemic chemotherapy with cisplatin and Alimta status post 2 cycles. He tolerated the second cycle of his treatment well with no significant adverse effects except for fatigue. I recommended for the patient to proceed with cycle #3 today as a scheduled. For hypertension, I strongly encouraged the patient to monitor his blood pressure closely at home. I also recommended for him to establish care with new primary care physician since his previous M.D. had retired. He has some  blood pressure medication at home but he was not using it. For the chemotherapy-induced anemia, we will continue to monitor his hemoglobin and hematocrit closely and consider the patient for transfusion if needed. The patient would come back for follow-up visit in 3 weeks for evaluation before starting cycle #4. The patient voices understanding of current disease status and treatment options and is in agreement with the current care plan. All questions were answered. The patient knows to call the clinic with any problems, questions or concerns. We can certainly see the patient much sooner if  necessary.  Disclaimer: This note was dictated with voice recognition software. Similar sounding words can inadvertently be transcribed and may not be corrected upon review.

## 2016-08-14 NOTE — Telephone Encounter (Signed)
Gave patient AVS and calender per 4/24 - appts already scheduled  No additional appts needed.

## 2016-08-15 DIAGNOSIS — H6121 Impacted cerumen, right ear: Secondary | ICD-10-CM | POA: Diagnosis not present

## 2016-08-16 ENCOUNTER — Encounter: Payer: Self-pay | Admitting: Medical Oncology

## 2016-08-20 ENCOUNTER — Other Ambulatory Visit: Payer: Self-pay | Admitting: *Deleted

## 2016-08-20 MED ORDER — DEXAMETHASONE 4 MG PO TABS: ORAL_TABLET | ORAL | 0 refills | Status: DC

## 2016-08-21 ENCOUNTER — Other Ambulatory Visit (HOSPITAL_BASED_OUTPATIENT_CLINIC_OR_DEPARTMENT_OTHER): Payer: No Typology Code available for payment source

## 2016-08-21 DIAGNOSIS — C3432 Malignant neoplasm of lower lobe, left bronchus or lung: Secondary | ICD-10-CM

## 2016-08-21 LAB — COMPREHENSIVE METABOLIC PANEL
ALT: 44 U/L (ref 0–55)
AST: 40 U/L — ABNORMAL HIGH (ref 5–34)
Albumin: 3.7 g/dL (ref 3.5–5.0)
Alkaline Phosphatase: 87 U/L (ref 40–150)
Anion Gap: 8 mEq/L (ref 3–11)
BILIRUBIN TOTAL: 0.27 mg/dL (ref 0.20–1.20)
BUN: 16.9 mg/dL (ref 7.0–26.0)
CALCIUM: 9.1 mg/dL (ref 8.4–10.4)
CO2: 28 mEq/L (ref 22–29)
Chloride: 104 mEq/L (ref 98–109)
Creatinine: 0.9 mg/dL (ref 0.7–1.3)
EGFR: >90 mL/min/{1.73_m2} (ref >90)
Glucose: 102 mg/dl (ref 70–140)
Potassium: 4.3 mEq/L (ref 3.5–5.1)
Sodium: 139 mEq/L (ref 136–145)
TOTAL PROTEIN: 6.7 g/dL (ref 6.4–8.3)

## 2016-08-21 LAB — CBC WITH DIFFERENTIAL/PLATELET
BASO%: 0.5 % (ref 0.0–2.0)
Basophils Absolute: 0 10*3/uL (ref 0.0–0.1)
EOS ABS: 0.2 10*3/uL (ref 0.0–0.5)
EOS%: 4.4 % (ref 0.0–7.0)
HEMATOCRIT: 37.6 % — AB (ref 38.4–49.9)
HGB: 12.6 g/dL — ABNORMAL LOW (ref 13.0–17.1)
LYMPH#: 1.7 10*3/uL (ref 0.9–3.3)
LYMPH%: 33.4 % (ref 14.0–49.0)
MCH: 31 pg (ref 27.2–33.4)
MCHC: 33.5 g/dL (ref 32.0–36.0)
MCV: 92.6 fL (ref 79.3–98.0)
MONO#: 0.7 10*3/uL (ref 0.1–0.9)
MONO%: 13.9 % (ref 0.0–14.0)
NEUT#: 2.4 10*3/uL (ref 1.5–6.5)
NEUT%: 47.8 % (ref 39.0–75.0)
PLATELETS: 222 10*3/uL (ref 140–400)
RBC: 4.06 10*6/uL — ABNORMAL LOW (ref 4.20–5.82)
RDW: 15.1 % — ABNORMAL HIGH (ref 11.0–14.6)
WBC: 5.1 10*3/uL (ref 4.0–10.3)

## 2016-08-21 LAB — MAGNESIUM: Magnesium: 1.5 mg/dl (ref 1.5–2.5)

## 2016-08-28 ENCOUNTER — Encounter: Payer: Self-pay | Admitting: Medical Oncology

## 2016-08-28 ENCOUNTER — Other Ambulatory Visit (HOSPITAL_BASED_OUTPATIENT_CLINIC_OR_DEPARTMENT_OTHER): Payer: No Typology Code available for payment source

## 2016-08-28 ENCOUNTER — Telehealth: Payer: Self-pay | Admitting: Medical Oncology

## 2016-08-28 DIAGNOSIS — C3432 Malignant neoplasm of lower lobe, left bronchus or lung: Secondary | ICD-10-CM | POA: Diagnosis not present

## 2016-08-28 LAB — COMPREHENSIVE METABOLIC PANEL
ALBUMIN: 4 g/dL (ref 3.5–5.0)
ALK PHOS: 106 U/L (ref 40–150)
ALT: 34 U/L (ref 0–55)
AST: 32 U/L (ref 5–34)
Anion Gap: 9 mEq/L (ref 3–11)
BUN: 15.1 mg/dL (ref 7.0–26.0)
CALCIUM: 9.6 mg/dL (ref 8.4–10.4)
CO2: 25 mEq/L (ref 22–29)
Chloride: 106 mEq/L (ref 98–109)
Creatinine: 1.1 mg/dL (ref 0.7–1.3)
EGFR: 85 mL/min/{1.73_m2} — AB (ref >90)
Glucose: 80 mg/dl (ref 70–140)
POTASSIUM: 4.7 meq/L (ref 3.5–5.1)
Sodium: 140 mEq/L (ref 136–145)
Total Bilirubin: 0.42 mg/dL (ref 0.20–1.20)
Total Protein: 7.3 g/dL (ref 6.4–8.3)

## 2016-08-28 LAB — CBC WITH DIFFERENTIAL/PLATELET
BASO%: 0.4 % (ref 0.0–2.0)
Basophils Absolute: 0 10*3/uL (ref 0.0–0.1)
EOS ABS: 0.1 10*3/uL (ref 0.0–0.5)
EOS%: 2 % (ref 0.0–7.0)
HEMATOCRIT: 40 % (ref 38.4–49.9)
HEMOGLOBIN: 13.3 g/dL (ref 13.0–17.1)
LYMPH%: 26 % (ref 14.0–49.0)
MCH: 30.9 pg (ref 27.2–33.4)
MCHC: 33.3 g/dL (ref 32.0–36.0)
MCV: 92.8 fL (ref 79.3–98.0)
MONO#: 0.6 10*3/uL (ref 0.1–0.9)
MONO%: 9.9 % (ref 0.0–14.0)
NEUT#: 4 10*3/uL (ref 1.5–6.5)
NEUT%: 61.7 % (ref 39.0–75.0)
Platelets: 226 10*3/uL (ref 140–400)
RBC: 4.31 10*6/uL (ref 4.20–5.82)
RDW: 15.5 % — AB (ref 11.0–14.6)
WBC: 6.4 10*3/uL (ref 4.0–10.3)
lymph#: 1.7 10*3/uL (ref 0.9–3.3)

## 2016-08-28 LAB — MAGNESIUM: MAGNESIUM: 1.9 mg/dL (ref 1.5–2.5)

## 2016-08-28 NOTE — Progress Notes (Signed)
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

## 2016-08-28 NOTE — Telephone Encounter (Signed)
Faxed work [employer].

## 2016-09-04 ENCOUNTER — Ambulatory Visit (HOSPITAL_BASED_OUTPATIENT_CLINIC_OR_DEPARTMENT_OTHER): Payer: BLUE CROSS/BLUE SHIELD

## 2016-09-04 ENCOUNTER — Telehealth: Payer: Self-pay | Admitting: Internal Medicine

## 2016-09-04 ENCOUNTER — Encounter: Payer: Self-pay | Admitting: Internal Medicine

## 2016-09-04 ENCOUNTER — Ambulatory Visit (HOSPITAL_BASED_OUTPATIENT_CLINIC_OR_DEPARTMENT_OTHER): Payer: BLUE CROSS/BLUE SHIELD | Admitting: Internal Medicine

## 2016-09-04 ENCOUNTER — Other Ambulatory Visit (HOSPITAL_BASED_OUTPATIENT_CLINIC_OR_DEPARTMENT_OTHER): Payer: BLUE CROSS/BLUE SHIELD

## 2016-09-04 VITALS — BP 159/88 | HR 72 | Temp 98.1°F | Resp 20 | Ht 75.0 in | Wt 157.3 lb

## 2016-09-04 DIAGNOSIS — I10 Essential (primary) hypertension: Secondary | ICD-10-CM

## 2016-09-04 DIAGNOSIS — C3431 Malignant neoplasm of lower lobe, right bronchus or lung: Secondary | ICD-10-CM | POA: Diagnosis not present

## 2016-09-04 DIAGNOSIS — Z5111 Encounter for antineoplastic chemotherapy: Secondary | ICD-10-CM | POA: Diagnosis not present

## 2016-09-04 DIAGNOSIS — C3432 Malignant neoplasm of lower lobe, left bronchus or lung: Secondary | ICD-10-CM

## 2016-09-04 LAB — CBC WITH DIFFERENTIAL/PLATELET
BASO%: 0 % (ref 0.0–2.0)
BASOS ABS: 0 10*3/uL (ref 0.0–0.1)
EOS ABS: 0 10*3/uL (ref 0.0–0.5)
EOS%: 0.1 % (ref 0.0–7.0)
HCT: 38.3 % — ABNORMAL LOW (ref 38.4–49.9)
HGB: 12.9 g/dL — ABNORMAL LOW (ref 13.0–17.1)
LYMPH%: 17.9 % (ref 14.0–49.0)
MCH: 31.4 pg (ref 27.2–33.4)
MCHC: 33.7 g/dL (ref 32.0–36.0)
MCV: 93.2 fL (ref 79.3–98.0)
MONO#: 0.6 10*3/uL (ref 0.1–0.9)
MONO%: 9 % (ref 0.0–14.0)
NEUT%: 73 % (ref 39.0–75.0)
NEUTROS ABS: 5.1 10*3/uL (ref 1.5–6.5)
PLATELETS: 367 10*3/uL (ref 140–400)
RBC: 4.11 10*6/uL — AB (ref 4.20–5.82)
RDW: 15.2 % — ABNORMAL HIGH (ref 11.0–14.6)
WBC: 7 10*3/uL (ref 4.0–10.3)
lymph#: 1.3 10*3/uL (ref 0.9–3.3)

## 2016-09-04 LAB — COMPREHENSIVE METABOLIC PANEL
ALT: 34 U/L (ref 0–55)
ANION GAP: 13 meq/L — AB (ref 3–11)
AST: 28 U/L (ref 5–34)
Albumin: 4 g/dL (ref 3.5–5.0)
Alkaline Phosphatase: 108 U/L (ref 40–150)
BILIRUBIN TOTAL: 0.29 mg/dL (ref 0.20–1.20)
BUN: 19.6 mg/dL (ref 7.0–26.0)
CHLORIDE: 107 meq/L (ref 98–109)
CO2: 21 meq/L — AB (ref 22–29)
Calcium: 9.9 mg/dL (ref 8.4–10.4)
Creatinine: 1.2 mg/dL (ref 0.7–1.3)
EGFR: 77 mL/min/{1.73_m2} — AB (ref >90)
GLUCOSE: 172 mg/dL — AB (ref 70–140)
Potassium: 4.2 mEq/L (ref 3.5–5.1)
SODIUM: 141 meq/L (ref 136–145)
Total Protein: 7.6 g/dL (ref 6.4–8.3)

## 2016-09-04 LAB — MAGNESIUM: Magnesium: 2 mg/dl (ref 1.5–2.5)

## 2016-09-04 MED ORDER — POTASSIUM CHLORIDE 2 MEQ/ML IV SOLN
Freq: Once | INTRAVENOUS | Status: AC
  Administered 2016-09-04: 12:00:00 via INTRAVENOUS
  Filled 2016-09-04: qty 10

## 2016-09-04 MED ORDER — SODIUM CHLORIDE 0.9 % IV SOLN
Freq: Once | INTRAVENOUS | Status: AC
  Administered 2016-09-04: 14:00:00 via INTRAVENOUS

## 2016-09-04 MED ORDER — SODIUM CHLORIDE 0.9 % IV SOLN
490.0000 mg/m2 | Freq: Once | INTRAVENOUS | Status: AC
  Administered 2016-09-04: 900 mg via INTRAVENOUS
  Filled 2016-09-04: qty 20

## 2016-09-04 MED ORDER — FOSAPREPITANT DIMEGLUMINE INJECTION 150 MG
Freq: Once | INTRAVENOUS | Status: AC
  Administered 2016-09-04: 14:00:00 via INTRAVENOUS
  Filled 2016-09-04: qty 5

## 2016-09-04 MED ORDER — PALONOSETRON HCL INJECTION 0.25 MG/5ML
0.2500 mg | Freq: Once | INTRAVENOUS | Status: AC
  Administered 2016-09-04: 0.25 mg via INTRAVENOUS

## 2016-09-04 MED ORDER — SODIUM CHLORIDE 0.9 % IV SOLN
75.0000 mg/m2 | Freq: Once | INTRAVENOUS | Status: AC
  Administered 2016-09-04: 140 mg via INTRAVENOUS
  Filled 2016-09-04: qty 140

## 2016-09-04 MED ORDER — PALONOSETRON HCL INJECTION 0.25 MG/5ML
INTRAVENOUS | Status: AC
  Filled 2016-09-04: qty 5

## 2016-09-04 NOTE — Telephone Encounter (Signed)
Gave patient AVS and calender per 5/15 los. [facility] to contact patient with CT schedule.

## 2016-09-04 NOTE — Patient Instructions (Signed)
[facility] Discharge Instructions for Patients Receiving Chemotherapy  Today you received the following chemotherapy agents:  Alimta and Cisplatin.  To help prevent nausea and vomiting after your treatment, we encourage you to take your nausea medication as directed.   If you develop nausea and vomiting that is not controlled by your nausea medication, call the clinic.   BELOW ARE SYMPTOMS THAT SHOULD BE REPORTED IMMEDIATELY:  *FEVER GREATER THAN 100.5 F  *CHILLS WITH OR WITHOUT FEVER  NAUSEA AND VOMITING THAT IS NOT CONTROLLED WITH YOUR NAUSEA MEDICATION  *UNUSUAL SHORTNESS OF BREATH  *UNUSUAL BRUISING OR BLEEDING  TENDERNESS IN MOUTH AND THROAT WITH OR WITHOUT PRESENCE OF ULCERS  *URINARY PROBLEMS  *BOWEL PROBLEMS  UNUSUAL RASH Items with * indicate a potential emergency and should be followed up as soon as possible.  Feel free to call the clinic you have any questions or concerns. The clinic phone number is [phone].  Please show the [facility] at check-in to the Emergency Department and triage nurse.

## 2016-09-04 NOTE — Progress Notes (Signed)
[facility] Telephone:(336) [phone]   Fax:(336) [phone]  OFFICE PROGRESS NOTE  [name], MD [place] [address]  DIAGNOSIS: stage IB (T2a, N0, M0) non-small cell lung cancer, adenocarcinoma diagnosed in December 2017.  PRIOR THERAPY: status post left lower lobectomy as well as wedge resection of the left upper lobe on 05/11/2016.  CURRENT THERAPY: Adjuvant systemic chemotherapy with cisplatin 75 MG/M2 and Alimta 500 MG/M2 every 3 weeks. First dose 07/03/2016. Status post 3 cycles.  INTERVAL HISTORY: [name] [name] 61 y.o. male returns to the clinic today for follow-up visit. The patient is feeling fine with no specific complaints except for fatigue. He has been tolerating his treatment with adjuvant systemic chemotherapy with cisplatin and Alimta fairly well except for swelling in the parotid glands area for a few days after the treatment. This could be secondary to treatment with Decadron. He denied having any chest pain, shortness of breath, cough or hemoptysis. He has no fever or chills. He has no nausea or vomiting. He denied having any recent weight loss or night sweats.  MEDICAL HISTORY: Past Medical History:  Diagnosis Date  . AAA (abdominal aortic aneurysm) ([facility])   . Cancer ([facility])    lung  . Encounter for antineoplastic chemotherapy 06/06/2016  . History of hepatitis B   . HNP (herniated nucleus pulposus), lumbar    L4 with radiculopathy  . Hypertension   . Mass of left lung   . Mitral insufficiency 04/06/2016   This patient will eventually need MV repair if his prognosis is good from oncology standpoint. However, his lung cancer therapy  is the priority right now. His cardiac condition won't preclude possible lung surgery.  Once his lung cancer is under control he will need a TEE to further evaluate his mitral valve anatomy and MR severity, and also have ischemic workup as tere is evidence on calcificati  . Varicose veins of  legs     ALLERGIES:  is allergic to tramadol.  MEDICATIONS:  Current Outpatient Prescriptions  Medication Sig Dispense Refill  . dexamethasone (DECADRON) 4 MG tablet 4 mg by mouth twice a day the day before, day of and day after the chemotherapy every 3 weeks. 40 tablet 0  . folic acid (FOLVITE) 1 MG tablet Take 1 tablet (1 mg total) by mouth daily. 30 tablet 4  . prochlorperazine (COMPAZINE) 10 MG tablet Take 1 tablet (10 mg total) by mouth every 6 (six) hours as needed for nausea or vomiting. (Patient not taking: Reported on 08/14/2016) 30 tablet 0   No current facility-administered medications for this visit.    Facility-Administered Medications Ordered in Other Visits  Medication Dose Route Frequency Provider Last Rate Last Dose  . heparin lock flush 100 unit/mL  500 Units Intracatheter Once PRN William Bears, MD      . sodium chloride flush (NS) 0.9 % injection 10 mL  10 mL Intracatheter PRN William Bears, MD        SURGICAL HISTORY:  Past Surgical History:  Procedure Laterality Date  . ABDOMINAL AORTIC ENDOVASCULAR STENT GRAFT N/A 03/12/2016   Procedure: ABDOMINAL AORTIC ENDOVASCULAR STENT GRAFT;  Surgeon: Conrad [place], MD;  Location: [facility];  Service: Vascular;  Laterality: N/A;  . COLONOSCOPY    . INGUINAL HERNIA REPAIR Left 1975   Left inguinal hernia  . INGUINAL HERNIA REPAIR Right   . LOBECTOMY Left 05/11/2016   Procedure: LEFT LOWER LOBE LOBECTOMY AND LEFT UPPER LOBE  RESECTION AND PLACEMENT OF ON-Q;  Surgeon: [name], MD;  Location: [facility];  Service: Thoracic;  Laterality: Left;  . LUMBAR LAMINECTOMY  October 22, 2012  . LYMPH NODE DISSECTION Left 05/11/2016   Procedure: LYMPH NODE DISSECTION;  Surgeon: [name], MD;  Location: [facility];  Service: Thoracic;  Laterality: Left;  . STAPLING OF BLEBS Left 05/11/2016   Procedure: STAPLING OF APICAL BLEB;  Surgeon: [name], MD;  Location: [facility];  Service: Thoracic;  Laterality: Left;  Marland Kitchen VIDEO ASSISTED  THORACOSCOPY Left 05/18/2016   Procedure: VIDEO ASSISTED THORACOSCOPY WITH REMOVAL OF LEFT APICAL BLEB;  Surgeon: [name], MD;  Location: [facility];  Service: Thoracic;  Laterality: Left;  Marland Kitchen VIDEO ASSISTED THORACOSCOPY (VATS)/WEDGE RESECTION Left 05/11/2016   Procedure: LEFT VIDEO ASSISTED THORACOSCOPY (VATS);  Surgeon: [name], MD;  Location: [facility];  Service: Thoracic;  Laterality: Left;  Marland Kitchen VIDEO BRONCHOSCOPY N/A 05/11/2016   Procedure: VIDEO BRONCHOSCOPY, LEFT LUNG;  Surgeon: [name], MD;  Location: [facility];  Service: Thoracic;  Laterality: N/A;  . VIDEO BRONCHOSCOPY N/A 05/18/2016   Procedure: VIDEO BRONCHOSCOPY WITH BRONCHIAL WASHING;  Surgeon: [name], MD;  Location: [facility];  Service: Thoracic;  Laterality: N/A;    REVIEW OF SYSTEMS:  A comprehensive review of systems was negative.   PHYSICAL EXAMINATION: General appearance: alert, cooperative and no distress Head: Normocephalic, without obvious abnormality, atraumatic Neck: no adenopathy, no JVD, supple, symmetrical, trachea midline and thyroid not enlarged, symmetric, no tenderness/mass/nodules Lymph nodes: Cervical, supraclavicular, and axillary nodes normal. Resp: clear to auscultation bilaterally Back: symmetric, no curvature. ROM normal. No CVA tenderness. Cardio: regular rate and rhythm, S1, S2 normal, no murmur, click, rub or gallop GI: soft, non-tender; bowel sounds normal; no masses,  no organomegaly Extremities: extremities normal, atraumatic, no cyanosis or edema  ECOG PERFORMANCE STATUS: 1 - Symptomatic but completely ambulatory  Blood pressure (!) 159/88, pulse 72, temperature 98.1 F (36.7 C), temperature source Oral, resp. rate 20, height '6\' 3"'$  (1.905 m), weight 157 lb 4.8 oz (71.4 kg), SpO2 97 %.  LABORATORY DATA: Lab Results  Component Value Date   WBC 7.0 09/04/2016   HGB 12.9 (L) 09/04/2016   HCT 38.3 (L) 09/04/2016   MCV 93.2 09/04/2016   PLT 367 09/04/2016      Chemistry       Component Value Date/Time   NA 140 08/28/2016 0826   K 4.7 08/28/2016 0826   CL 95 (L) 05/20/2016 0400   CO2 25 08/28/2016 0826   BUN 15.1 08/28/2016 0826   CREATININE 1.1 08/28/2016 0826      Component Value Date/Time   CALCIUM 9.6 08/28/2016 0826   ALKPHOS 106 08/28/2016 0826   AST 32 08/28/2016 0826   ALT 34 08/28/2016 0826   BILITOT 0.42 08/28/2016 0826       RADIOGRAPHIC STUDIES: No results found.  ASSESSMENT AND PLAN:  This is a very pleasant 61 years old white male with a stage IB non-small cell lung cancer, adenocarcinoma status post left lower lobectomy as well as wedge resection of the left upper lobe. The patient is currently undergoing adjuvant systemic chemotherapy with cisplatin and Alimta status post 3 cycles. He has been tolerating this treatment well with no significant adverse effects except for fatigue. I recommended for the patient to proceed with cycle #4 today as a scheduled. I will see him back for follow-up visit in one month's for evaluation after repeating CT scan of  the chest for restaging of his disease. For hypertension he was advised to take his blood pressure medication as prescribed. He was advised to call immediately if he has any concerning symptoms in the interval. The patient voices understanding of current disease status and treatment options and is in agreement with the current care plan. All questions were answered. The patient knows to call the clinic with any problems, questions or concerns. We can certainly see the patient much sooner if necessary. I spent 10 minutes counseling the patient face to face. The total time spent in the appointment was 15 minutes.  Disclaimer: This note was dictated with voice recognition software. Similar sounding words can inadvertently be transcribed and may not be corrected upon review.

## 2016-09-22 NOTE — Addendum Note (Signed)
Addendum  created 09/22/16 0824 by Duane Boston, MD   Sign clinical note

## 2016-09-24 ENCOUNTER — Telehealth: Payer: Self-pay

## 2016-09-24 ENCOUNTER — Telehealth: Payer: Self-pay | Admitting: Internal Medicine

## 2016-09-24 NOTE — Telephone Encounter (Signed)
Spoke with the patient to confirm that Out of Work letter was faxed to [facility] fax [phone] on today 09/24/16. Patient was thankful for this information

## 2016-10-05 ENCOUNTER — Other Ambulatory Visit (HOSPITAL_BASED_OUTPATIENT_CLINIC_OR_DEPARTMENT_OTHER): Payer: No Typology Code available for payment source

## 2016-10-05 ENCOUNTER — Ambulatory Visit (HOSPITAL_COMMUNITY)
Admission: RE | Admit: 2016-10-05 | Discharge: 2016-10-05 | Disposition: A | Discharge status: Home / Self Care | Payer: BLUE CROSS/BLUE SHIELD | Source: Ambulatory Visit | Attending: Internal Medicine | Admitting: Internal Medicine

## 2016-10-05 DIAGNOSIS — R911 Solitary pulmonary nodule: Secondary | ICD-10-CM | POA: Diagnosis not present

## 2016-10-05 DIAGNOSIS — C3432 Malignant neoplasm of lower lobe, left bronchus or lung: Secondary | ICD-10-CM | POA: Diagnosis not present

## 2016-10-05 DIAGNOSIS — J439 Emphysema, unspecified: Secondary | ICD-10-CM | POA: Insufficient documentation

## 2016-10-05 DIAGNOSIS — I712 Thoracic aortic aneurysm, without rupture: Secondary | ICD-10-CM | POA: Diagnosis not present

## 2016-10-05 DIAGNOSIS — Z5111 Encounter for antineoplastic chemotherapy: Secondary | ICD-10-CM

## 2016-10-05 DIAGNOSIS — R222 Localized swelling, mass and lump, trunk: Secondary | ICD-10-CM | POA: Diagnosis not present

## 2016-10-05 DIAGNOSIS — J929 Pleural plaque without asbestos: Secondary | ICD-10-CM | POA: Diagnosis not present

## 2016-10-05 DIAGNOSIS — I700 Atherosclerosis of aorta: Secondary | ICD-10-CM | POA: Diagnosis not present

## 2016-10-05 DIAGNOSIS — I10 Essential (primary) hypertension: Secondary | ICD-10-CM

## 2016-10-05 DIAGNOSIS — D1432 Benign neoplasm of left bronchus and lung: Secondary | ICD-10-CM | POA: Diagnosis not present

## 2016-10-05 LAB — CBC WITH DIFFERENTIAL/PLATELET
BASO%: 0.7 % (ref 0.0–2.0)
Basophils Absolute: 0.1 10*3/uL (ref 0.0–0.1)
EOS ABS: 0.2 10*3/uL (ref 0.0–0.5)
EOS%: 2.3 % (ref 0.0–7.0)
HEMATOCRIT: 35.2 % — AB (ref 38.4–49.9)
HEMOGLOBIN: 11.9 g/dL — AB (ref 13.0–17.1)
LYMPH#: 2.2 10*3/uL (ref 0.9–3.3)
LYMPH%: 29.6 % (ref 14.0–49.0)
MCH: 31.8 pg (ref 27.2–33.4)
MCHC: 33.7 g/dL (ref 32.0–36.0)
MCV: 94.4 fL (ref 79.3–98.0)
MONO#: 1.3 10*3/uL — AB (ref 0.1–0.9)
MONO%: 18.2 % — ABNORMAL HIGH (ref 0.0–14.0)
NEUT#: 3.6 10*3/uL (ref 1.5–6.5)
NEUT%: 49.2 % (ref 39.0–75.0)
PLATELETS: 217 10*3/uL (ref 140–400)
RBC: 3.72 10*6/uL — ABNORMAL LOW (ref 4.20–5.82)
RDW: 16.6 % — AB (ref 11.0–14.6)
WBC: 7.4 10*3/uL (ref 4.0–10.3)

## 2016-10-05 LAB — COMPREHENSIVE METABOLIC PANEL
ALBUMIN: 3.8 g/dL (ref 3.5–5.0)
ALK PHOS: 90 U/L (ref 40–150)
ALT: 26 U/L (ref 0–55)
AST: 35 U/L — ABNORMAL HIGH (ref 5–34)
Anion Gap: 8 mEq/L (ref 3–11)
BILIRUBIN TOTAL: 0.33 mg/dL (ref 0.20–1.20)
BUN: 25.4 mg/dL (ref 7.0–26.0)
CALCIUM: 9.7 mg/dL (ref 8.4–10.4)
CO2: 24 mEq/L (ref 22–29)
Chloride: 109 mEq/L (ref 98–109)
Creatinine: 1.9 mg/dL — ABNORMAL HIGH (ref 0.7–1.3)
EGFR: 42 mL/min/{1.73_m2} — AB (ref >90)
GLUCOSE: 91 mg/dL (ref 70–140)
POTASSIUM: 5.1 meq/L (ref 3.5–5.1)
SODIUM: 140 meq/L (ref 136–145)
TOTAL PROTEIN: 7.2 g/dL (ref 6.4–8.3)

## 2016-10-05 MED ORDER — IOPAMIDOL (ISOVUE-300) INJECTION 61%
INTRAVENOUS | Status: AC
  Filled 2016-10-05: qty 75

## 2016-10-05 MED ORDER — IOPAMIDOL (ISOVUE-300) INJECTION 61%
75.0000 mL | Freq: Once | INTRAVENOUS | Status: AC | PRN
  Administered 2016-10-05: 50 mL via INTRAVENOUS

## 2016-10-09 ENCOUNTER — Encounter: Payer: Self-pay | Admitting: Medical Oncology

## 2016-10-09 ENCOUNTER — Ambulatory Visit (HOSPITAL_BASED_OUTPATIENT_CLINIC_OR_DEPARTMENT_OTHER): Payer: No Typology Code available for payment source | Admitting: Internal Medicine

## 2016-10-09 ENCOUNTER — Telehealth: Payer: Self-pay | Admitting: Internal Medicine

## 2016-10-09 ENCOUNTER — Encounter: Payer: Self-pay | Admitting: Internal Medicine

## 2016-10-09 VITALS — BP 163/90 | HR 87 | Temp 98.3°F | Resp 20 | Ht 75.0 in | Wt 161.7 lb

## 2016-10-09 DIAGNOSIS — N289 Disorder of kidney and ureter, unspecified: Secondary | ICD-10-CM

## 2016-10-09 DIAGNOSIS — C3431 Malignant neoplasm of lower lobe, right bronchus or lung: Secondary | ICD-10-CM | POA: Diagnosis not present

## 2016-10-09 DIAGNOSIS — I10 Essential (primary) hypertension: Secondary | ICD-10-CM | POA: Diagnosis not present

## 2016-10-09 DIAGNOSIS — C3432 Malignant neoplasm of lower lobe, left bronchus or lung: Secondary | ICD-10-CM

## 2016-10-09 HISTORY — DX: Disorder of kidney and ureter, unspecified: N28.9

## 2016-10-09 NOTE — Telephone Encounter (Signed)
Scheduled appt per 6/19 los - Gave patient AVS and calender per LOS. [facility] to contact patient with CT schedule

## 2016-10-09 NOTE — Progress Notes (Signed)
[place] Telephone:(336) [phone]   Fax:(336) [phone]  OFFICE PROGRESS NOTE  William Huh, MD [place] [address]  DIAGNOSIS: stage IB (T2a, N0, M0) non-small cell lung cancer, adenocarcinoma diagnosed in December 2017.  PRIOR THERAPY: status post left lower lobectomy as well as wedge resection of William left upper lobe on 05/11/2016.  CURRENT THERAPY: Adjuvant systemic chemotherapy with cisplatin 75 MG/M2 and Alimta 500 MG/M2 every 3 weeks. First dose 07/03/2016. Status post 3 cycles.  INTERVAL HISTORY: William Kales [name] 61 y.o. male returns to William clinic today for follow-up visit. [name] is feeling fine with no specific complaints except for fatigue. He has been tolerating his treatment with adjuvant systemic chemotherapy with cisplatin and Alimta fairly well except for swelling in William parotid glands area for a few days after William treatment. This could be secondary to treatment with Decadron. He denied having any chest pain, shortness of breath, cough or hemoptysis. He has no fever or chills. He has no nausea or vomiting. He denied having any recent weight loss or night sweats.  MEDICAL HISTORY: Past Medical History:  Diagnosis Date  . AAA (abdominal aortic aneurysm) ([place])   . Cancer ([place])    lung  . Encounter for antineoplastic chemotherapy 06/06/2016  . History of hepatitis B   . HNP (herniated nucleus pulposus), lumbar    L4 with radiculopathy  . Hypertension   . Mass of left lung   . Mitral insufficiency 04/06/2016   This Barker will eventually need MV repair if his prognosis is good from oncology standpoint. However, his lung cancer therapy  is William priority right now. His cardiac condition won't preclude possible lung surgery.  Once his lung cancer is under control he will need a TEE to further evaluate his mitral valve anatomy and MR severity, and also have ischemic workup as tere is evidence on calcificati  . Varicose veins of  legs     ALLERGIES:  is allergic to tramadol.  MEDICATIONS:  Current Outpatient Prescriptions  Medication Sig Dispense Refill  . dexamethasone (DECADRON) 4 MG tablet 4 mg by mouth twice a day William day before, day of and day after William chemotherapy every 3 weeks. 40 tablet 0  . folic acid (FOLVITE) 1 MG tablet Take 1 tablet (1 mg total) by mouth daily. 30 tablet 4  . prochlorperazine (COMPAZINE) 10 MG tablet Take 1 tablet (10 mg total) by mouth every 6 (six) hours as needed for nausea or vomiting. (Barker not taking: Reported on 08/14/2016) 30 tablet 0   No current facility-administered medications for this visit.    Facility-Administered Medications Ordered in Other Visits  Medication Dose Route Frequency Provider Last Rate Last Dose  . heparin lock flush 100 unit/mL  500 Units Intracatheter Once PRN Curt Bears, MD      . sodium chloride flush (NS) 0.9 % injection 10 mL  10 mL Intracatheter PRN Curt Bears, MD        SURGICAL HISTORY:  Past Surgical History:  Procedure Laterality Date  . ABDOMINAL AORTIC ENDOVASCULAR STENT GRAFT N/A 03/12/2016   Procedure: ABDOMINAL AORTIC ENDOVASCULAR STENT GRAFT;  Surgeon: Conrad [place], MD;  Location: [facility];  Service: Vascular;  Laterality: N/A;  . COLONOSCOPY    . INGUINAL HERNIA REPAIR Left 1975   Left inguinal hernia  . INGUINAL HERNIA REPAIR Right   . LOBECTOMY Left 05/11/2016   Procedure: LEFT LOWER LOBE LOBECTOMY AND LEFT UPPER LOBE  RESECTION AND PLACEMENT OF ON-Q;  Surgeon: [name], MD;  Location: [facility];  Service: Thoracic;  Laterality: Left;  . LUMBAR LAMINECTOMY  October 22, 2012  . LYMPH NODE DISSECTION Left 05/11/2016   Procedure: LYMPH NODE DISSECTION;  Surgeon: [name], MD;  Location: [facility];  Service: Thoracic;  Laterality: Left;  . STAPLING OF BLEBS Left 05/11/2016   Procedure: STAPLING OF APICAL BLEB;  Surgeon: [name], MD;  Location: [facility];  Service: Thoracic;  Laterality: Left;  Marland Kitchen VIDEO ASSISTED  THORACOSCOPY Left 05/18/2016   Procedure: VIDEO ASSISTED THORACOSCOPY WITH REMOVAL OF LEFT APICAL BLEB;  Surgeon: [name], MD;  Location: [facility];  Service: Thoracic;  Laterality: Left;  Marland Kitchen VIDEO ASSISTED THORACOSCOPY (VATS)/WEDGE RESECTION Left 05/11/2016   Procedure: LEFT VIDEO ASSISTED THORACOSCOPY (VATS);  Surgeon: [name], MD;  Location: [facility];  Service: Thoracic;  Laterality: Left;  Marland Kitchen VIDEO BRONCHOSCOPY N/A 05/11/2016   Procedure: VIDEO BRONCHOSCOPY, LEFT LUNG;  Surgeon: [name], MD;  Location: [facility];  Service: Thoracic;  Laterality: N/A;  . VIDEO BRONCHOSCOPY N/A 05/18/2016   Procedure: VIDEO BRONCHOSCOPY WITH BRONCHIAL WASHING;  Surgeon: [name], MD;  Location: [facility];  Service: Thoracic;  Laterality: N/A;    REVIEW OF SYSTEMS:  A comprehensive review of systems was negative.   PHYSICAL EXAMINATION: General appearance: alert, cooperative and no distress Head: Normocephalic, without obvious abnormality, atraumatic Neck: no adenopathy, no JVD, supple, symmetrical, trachea midline and thyroid not enlarged, symmetric, no tenderness/mass/nodules Lymph nodes: Cervical, supraclavicular, and axillary nodes normal. Resp: clear to auscultation bilaterally Back: symmetric, no curvature. ROM normal. No CVA tenderness. Cardio: regular rate and rhythm, S1, S2 normal, no murmur, click, rub or gallop GI: soft, non-tender; bowel sounds normal; no masses,  no organomegaly Extremities: extremities normal, atraumatic, no cyanosis or edema  ECOG PERFORMANCE STATUS: 1 - Symptomatic but completely ambulatory  Blood pressure (!) 163/90, pulse 87, temperature 98.3 F (36.8 C), resp. rate 20, height 6\' 3"  (1.905 m), weight 161 lb 11.2 oz (73.3 kg), SpO2 100 %.  LABORATORY DATA: Lab Results  Component Value Date   WBC 7.4 10/05/2016   HGB 11.9 (L) 10/05/2016   HCT 35.2 (L) 10/05/2016   MCV 94.4 10/05/2016   PLT 217 10/05/2016      Chemistry      Component Value  Date/Time   NA 140 10/05/2016 0817   K 5.1 10/05/2016 0817   CL 95 (L) 05/20/2016 0400   CO2 24 10/05/2016 0817   BUN 25.4 10/05/2016 0817   CREATININE 1.9 (H) 10/05/2016 0817      Component Value Date/Time   CALCIUM 9.7 10/05/2016 0817   ALKPHOS 90 10/05/2016 0817   AST 35 (H) 10/05/2016 0817   ALT 26 10/05/2016 0817   BILITOT 0.33 10/05/2016 0817       RADIOGRAPHIC STUDIES: Ct Chest W Contrast  Result Date: 10/05/2016 CLINICAL DATA:  Stage IB left lower lobe primary bronchogenic adenocarcinoma diagnosed December 2017 status post left lower lobectomy and left upper lobe wedge resection 05/11/2016 and adjuvant systemic chemotherapy, presenting for restaging. EXAM: CT CHEST WITH CONTRAST TECHNIQUE: Multidetector CT imaging of William chest was performed during intravenous contrast administration. CONTRAST:  35mL ISOVUE-300 IOPAMIDOL (ISOVUE-300) INJECTION 61% COMPARISON:  03/07/2016 PET-CT.  02/16/2016 chest CT. FINDINGS: Cardiovascular: Top-normal heart size. No significant pericardial fluid/thickening. Atherosclerotic thoracic aorta with stable 4.1 cm ectatic ascending thoracic aorta and slightly increased aneurysmal 4.3 cm proximal descending thoracic aorta (previously  4.1 cm). Normal caliber pulmonary arteries. No central pulmonary emboli. Mediastinum/Nodes: Stable heterogeneous hypodense 1.4 cm left thyroid lobe nodule. Unremarkable esophagus. No pathologically enlarged axillary, mediastinal or hilar lymph nodes. Lobulated 2.1 x 1.0 cm anterior mediastinal mass with mildly heterogeneous soft tissue density (series 2/ image 96), stable in size and appearance since 02/16/2016 chest CT. Lungs/Pleura: No pneumothorax. Stable large calcified posterior right pleural plaque. No calcified left pleural plaques. Status post left lower lobectomy and wedge resection in William posterior left upper lobe. Moderate centrilobular and paraseptal emphysema with mild diffuse bronchial wall thickening. Irregular  peripheral left upper lobe 8 x 6 mm pulmonary nodule abutting William peripheral pleural surface (series 7/ image 59), increased from 5 x 4 mm on 02/16/2016. Additional small solid pulmonary nodules throughout William right lung measuring up to 5 mm in William subpleural anteromedial right lower lobe (series 7/ image 134) are all stable since 02/16/2016 and probably benign. No additional new significant pulmonary nodules. No acute consolidative airspace disease. Upper abdomen: Unremarkable. Musculoskeletal: No aggressive appearing focal osseous lesions. Expected post thoracotomy change in William lateral left seventh and eighth ribs. IMPRESSION: 1. Interval growth of irregular peripheral left upper lobe 8 x 6 mm pulmonary nodule abutting William pleura, cannot exclude a synchronous primary bronchogenic carcinoma or metastasis. This nodule remains just below PET resolution, and a follow-up chest CT is advised in 3 months. 2. No additional potential findings of metastatic disease in William chest. 3. Indeterminate lobulated 2.1 x 1.0 cm anterior mediastinal mass with heterogeneous soft tissue density, stable since 02/16/2016 chest CT. Thymic neoplasm is on William differential. Surgical consultation and continued imaging surveillance is warranted. 4. Stable ectatic 4.1 cm ascending thoracic aorta. Slightly increased 4.3 cm proximal descending thoracic aortic aneurysm. 5. Stable nonspecific large right calcified pleural plaque, which could be due to prior pleural infection or trauma versus asbestos related pleural disease. Aortic Atherosclerosis (ICD10-I70.0) and Emphysema (ICD10-J43.9). Aortic aneurysm NOS (ICD10-I71.9). Electronically Signed   By: Ilona Sorrel M.D.   On: 10/05/2016 10:34    ASSESSMENT AND PLAN:  This is a very pleasant 61 years old white male with a stage IB non-small cell lung cancer, adenocarcinoma status post left lower lobectomy as well as wedge resection of William left upper lobe. [name] is currently undergoing  adjuvant systemic chemotherapy with cisplatin and Alimta status post 3 cycles. He has been tolerating this treatment well with no significant adverse effects except for fatigue. I recommended for [name] to proceed with cycle #4 today as a scheduled. I will see him back for follow-up visit in one month's for evaluation after repeating CT scan of William chest for restaging of his disease. For hypertension he was advised to take his blood pressure medication as prescribed. He was advised to call immediately if he has any concerning symptoms in William interval. [name] voices understanding of current disease status and treatment options and is in agreement with William current care plan. All questions were answered. [name] knows to call William clinic with any problems, questions or concerns. We can certainly see [name] much sooner if necessary. I spent 10 minutes counseling [name] face to face. William total time spent in William appointment was 15 minutes.  Disclaimer: This note was dictated with voice recognition software. Similar sounding words can inadvertently be transcribed and may not be corrected upon review.            [place] Telephone:(336) [phone]   Fax:(336) [phone]  OFFICE PROGRESS NOTE  William Huh, MD [place] [address]  DIAGNOSIS: stage IB (T2a, N0, M0) non-small cell lung cancer, adenocarcinoma diagnosed in December 2017.  PRIOR THERAPY:  1) status post left lower lobectomy as well as wedge resection of William left upper lobe on 05/11/2016. 2) Adjuvant systemic chemotherapy with cisplatin 75 MG/M2 and Alimta 500 MG/M2 every 3 weeks. First dose 07/03/2016. Status post 4 cycles.  CURRENT THERAPY: Observation.  INTERVAL HISTORY: William Kales [name] 61 y.o. male returns to William clinic today for follow-up visit accompanied by his wife. [name] is feeling fine today with no specific complaints. He gained few pounds since his  last visit. He denied having any chest pain, shortness of breath, cough or hemoptysis. He has no fever or chills. He denied having any nausea, vomiting, diarrhea or constipation. He tolerated his course of adjuvant systemic chemotherapy fairly well. He had repeat CT scan of William chest performed recently and he is here for evaluation and discussion of his scan results.   MEDICAL HISTORY: Past Medical History:  Diagnosis Date  . AAA (abdominal aortic aneurysm) ([place])   . Cancer ([place])    lung  . Encounter for antineoplastic chemotherapy 06/06/2016  . History of hepatitis B   . HNP (herniated nucleus pulposus), lumbar    L4 with radiculopathy  . Hypertension   . Mass of left lung   . Mitral insufficiency 04/06/2016   This Barker will eventually need MV repair if his prognosis is good from oncology standpoint. However, his lung cancer therapy  is William priority right now. His cardiac condition won't preclude possible lung surgery.  Once his lung cancer is under control he will need a TEE to further evaluate his mitral valve anatomy and MR severity, and also have ischemic workup as tere is evidence on calcificati  . Varicose veins of legs     ALLERGIES:  is allergic to tramadol.  MEDICATIONS:  Current Outpatient Prescriptions  Medication Sig Dispense Refill  . dexamethasone (DECADRON) 4 MG tablet 4 mg by mouth twice a day William day before, day of and day after William chemotherapy every 3 weeks. 40 tablet 0  . folic acid (FOLVITE) 1 MG tablet Take 1 tablet (1 mg total) by mouth daily. 30 tablet 4  . prochlorperazine (COMPAZINE) 10 MG tablet Take 1 tablet (10 mg total) by mouth every 6 (six) hours as needed for nausea or vomiting. (Barker not taking: Reported on 08/14/2016) 30 tablet 0   No current facility-administered medications for this visit.    Facility-Administered Medications Ordered in Other Visits  Medication Dose Route Frequency Provider Last Rate Last Dose  . heparin lock flush 100  unit/mL  500 Units Intracatheter Once PRN Curt Bears, MD      . sodium chloride flush (NS) 0.9 % injection 10 mL  10 mL Intracatheter PRN Curt Bears, MD        SURGICAL HISTORY:  Past Surgical History:  Procedure Laterality Date  . ABDOMINAL AORTIC ENDOVASCULAR STENT GRAFT N/A 03/12/2016   Procedure: ABDOMINAL AORTIC ENDOVASCULAR STENT GRAFT;  Surgeon: Conrad [place], MD;  Location: [facility];  Service: Vascular;  Laterality: N/A;  . COLONOSCOPY    . INGUINAL HERNIA REPAIR Left 1975   Left inguinal hernia  . INGUINAL HERNIA REPAIR Right   . LOBECTOMY Left 05/11/2016   Procedure: LEFT LOWER LOBE LOBECTOMY AND LEFT UPPER LOBE  RESECTION AND PLACEMENT OF ON-Q;  Surgeon: [name], MD;  [facility];  Service: Thoracic;  Laterality: Left;  . LUMBAR LAMINECTOMY  October 22, 2012  . LYMPH NODE DISSECTION Left 05/11/2016   Procedure: LYMPH NODE DISSECTION;  Surgeon: [name], MD;  Location: [facility];  Service: Thoracic;  Laterality: Left;  . STAPLING OF BLEBS Left 05/11/2016   Procedure: STAPLING OF APICAL BLEB;  Surgeon: [name], MD;  Location: [facility];  Service: Thoracic;  Laterality: Left;  Marland Kitchen VIDEO ASSISTED THORACOSCOPY Left 05/18/2016   Procedure: VIDEO ASSISTED THORACOSCOPY WITH REMOVAL OF LEFT APICAL BLEB;  Surgeon: [name], MD;  Location: [facility];  Service: Thoracic;  Laterality: Left;  Marland Kitchen VIDEO ASSISTED THORACOSCOPY (VATS)/WEDGE RESECTION Left 05/11/2016   Procedure: LEFT VIDEO ASSISTED THORACOSCOPY (VATS);  Surgeon: [name], MD;  Location: [facility];  Service: Thoracic;  Laterality: Left;  Marland Kitchen VIDEO BRONCHOSCOPY N/A 05/11/2016   Procedure: VIDEO BRONCHOSCOPY, LEFT LUNG;  Surgeon: [name], MD;  Location: [facility];  Service: Thoracic;  Laterality: N/A;  . VIDEO BRONCHOSCOPY N/A 05/18/2016   Procedure: VIDEO BRONCHOSCOPY WITH BRONCHIAL WASHING;  Surgeon: [name], MD;  Location: [facility];  Service: Thoracic;  Laterality: N/A;    REVIEW OF  SYSTEMS:  Constitutional: negative Eyes: negative Ears, nose, mouth, throat, and face: negative Respiratory: negative Cardiovascular: negative Gastrointestinal: negative Genitourinary:negative Integument/breast: negative Hematologic/lymphatic: negative Musculoskeletal:negative Neurological: negative Behavioral/Psych: negative Endocrine: negative Allergic/Immunologic: negative   PHYSICAL EXAMINATION: General appearance: alert, cooperative and no distress Head: Normocephalic, without obvious abnormality, atraumatic Neck: no adenopathy, no JVD, supple, symmetrical, trachea midline and thyroid not enlarged, symmetric, no tenderness/mass/nodules Lymph nodes: Cervical, supraclavicular, and axillary nodes normal. Resp: clear to auscultation bilaterally Back: symmetric, no curvature. ROM normal. No CVA tenderness. Cardio: regular rate and rhythm, S1, S2 normal, no murmur, click, rub or gallop GI: soft, non-tender; bowel sounds normal; no masses,  no organomegaly Extremities: extremities normal, atraumatic, no cyanosis or edema Neurologic: Alert and oriented X 3, normal strength and tone. Normal symmetric reflexes. Normal coordination and gait  ECOG PERFORMANCE STATUS: 1 - Symptomatic but completely ambulatory  Blood pressure (!) 163/90, pulse 87, temperature 98.3 F (36.8 C), resp. rate 20, height 6\' 3"  (1.905 m), weight 161 lb 11.2 oz (73.3 kg), SpO2 100 %.  LABORATORY DATA: Lab Results  Component Value Date   WBC 7.4 10/05/2016   HGB 11.9 (L) 10/05/2016   HCT 35.2 (L) 10/05/2016   MCV 94.4 10/05/2016   PLT 217 10/05/2016      Chemistry      Component Value Date/Time   NA 140 10/05/2016 0817   K 5.1 10/05/2016 0817   CL 95 (L) 05/20/2016 0400   CO2 24 10/05/2016 0817   BUN 25.4 10/05/2016 0817   CREATININE 1.9 (H) 10/05/2016 0817      Component Value Date/Time   CALCIUM 9.7 10/05/2016 0817   ALKPHOS 90 10/05/2016 0817   AST 35 (H) 10/05/2016 0817   ALT 26 10/05/2016  0817   BILITOT 0.33 10/05/2016 0817       RADIOGRAPHIC STUDIES: Ct Chest W Contrast  Result Date: 10/05/2016 CLINICAL DATA:  Stage IB left lower lobe primary bronchogenic adenocarcinoma diagnosed December 2017 status post left lower lobectomy and left upper lobe wedge resection 05/11/2016 and adjuvant systemic chemotherapy, presenting for restaging. EXAM: CT CHEST WITH CONTRAST TECHNIQUE: Multidetector CT imaging of William chest was performed during intravenous contrast administration. CONTRAST:  83mL ISOVUE-300 IOPAMIDOL (ISOVUE-300) INJECTION 61% COMPARISON:  03/07/2016 PET-CT.  02/16/2016 chest CT. FINDINGS: Cardiovascular: Top-normal  heart size. No significant pericardial fluid/thickening. Atherosclerotic thoracic aorta with stable 4.1 cm ectatic ascending thoracic aorta and slightly increased aneurysmal 4.3 cm proximal descending thoracic aorta (previously 4.1 cm). Normal caliber pulmonary arteries. No central pulmonary emboli. Mediastinum/Nodes: Stable heterogeneous hypodense 1.4 cm left thyroid lobe nodule. Unremarkable esophagus. No pathologically enlarged axillary, mediastinal or hilar lymph nodes. Lobulated 2.1 x 1.0 cm anterior mediastinal mass with mildly heterogeneous soft tissue density (series 2/ image 96), stable in size and appearance since 02/16/2016 chest CT. Lungs/Pleura: No pneumothorax. Stable large calcified posterior right pleural plaque. No calcified left pleural plaques. Status post left lower lobectomy and wedge resection in William posterior left upper lobe. Moderate centrilobular and paraseptal emphysema with mild diffuse bronchial wall thickening. Irregular peripheral left upper lobe 8 x 6 mm pulmonary nodule abutting William peripheral pleural surface (series 7/ image 59), increased from 5 x 4 mm on 02/16/2016. Additional small solid pulmonary nodules throughout William right lung measuring up to 5 mm in William subpleural anteromedial right lower lobe (series 7/ image 134) are all stable since  02/16/2016 and probably benign. No additional new significant pulmonary nodules. No acute consolidative airspace disease. Upper abdomen: Unremarkable. Musculoskeletal: No aggressive appearing focal osseous lesions. Expected post thoracotomy change in William lateral left seventh and eighth ribs. IMPRESSION: 1. Interval growth of irregular peripheral left upper lobe 8 x 6 mm pulmonary nodule abutting William pleura, cannot exclude a synchronous primary bronchogenic carcinoma or metastasis. This nodule remains just below PET resolution, and a follow-up chest CT is advised in 3 months. 2. No additional potential findings of metastatic disease in William chest. 3. Indeterminate lobulated 2.1 x 1.0 cm anterior mediastinal mass with heterogeneous soft tissue density, stable since 02/16/2016 chest CT. Thymic neoplasm is on William differential. Surgical consultation and continued imaging surveillance is warranted. 4. Stable ectatic 4.1 cm ascending thoracic aorta. Slightly increased 4.3 cm proximal descending thoracic aortic aneurysm. 5. Stable nonspecific large right calcified pleural plaque, which could be due to prior pleural infection or trauma versus asbestos related pleural disease. Aortic Atherosclerosis (ICD10-I70.0) and Emphysema (ICD10-J43.9). Aortic aneurysm NOS (ICD10-I71.9). Electronically Signed   By: Ilona Sorrel M.D.   On: 10/05/2016 10:34    ASSESSMENT AND PLAN:  This is a very pleasant 61 years old white male with a stage IB non-small cell lung cancer, adenocarcinoma status post wedge resection of William left upper lobe followed by 4 cycles of adjuvant systemic chemotherapy with cisplatin and Alimta and he tolerated his treatment well except for fatigue. [name] had repeat CT scan of William chest performed recently. I personally and independently reviewed William scan images and discuss William results with [name] and his wife and showed them William images today. For William CT scan showed no evidence for disease recurrence  but there was a suspicious 0.8 x 0.6 cm pulmonary nodule in William peripheral left upper lobe abutting William pleura concerning for synchronous primary bronchogenic carcinoma or metastasis. I recommended for [name] to continue on observation for now. I will repeat CT scan of William chest in 3 months for further evaluation of this lesion. [name] and his wife agreed to William current plan. For William renal insufficiency, I strongly encouraged [name] to increase his oral intake of fluids. We will continue to monitor his renal function closely on William upcoming blood work. For hypertension, he will continue with his current blood pressure medication. He was advised to call immediately if he has any concerning symptoms in William interval. William  Barker voices understanding of current disease status and treatment options and is in agreement with William current care plan. All questions were answered. [name] knows to call William clinic with any problems, questions or concerns. We can certainly see [name] much sooner if necessary.  Disclaimer: This note was dictated with voice recognition software. Similar sounding words can inadvertently be transcribed and may not be corrected upon review.

## 2016-10-11 ENCOUNTER — Encounter: Payer: Self-pay | Admitting: *Deleted

## 2016-10-11 ENCOUNTER — Telehealth: Payer: Self-pay | Admitting: *Deleted

## 2016-10-11 NOTE — Telephone Encounter (Signed)
Pt called with request for return to work letter. Reviewed with MD- pt to return to work 10/23/2016 per pt request. MD is in agreement with this. Letter faxed to [employer] at [employer]. [phone] Ph: [phone]

## 2016-10-16 ENCOUNTER — Encounter: Payer: Self-pay | Admitting: Medical Oncology

## 2016-10-16 ENCOUNTER — Telehealth: Payer: Self-pay | Admitting: Medical Oncology

## 2016-10-16 NOTE — Telephone Encounter (Signed)
Letter faxed for return to work with no restrictions.fax number [phone]

## 2016-10-31 ENCOUNTER — Encounter: Payer: Self-pay | Admitting: Vascular Surgery

## 2016-11-01 NOTE — Progress Notes (Signed)
Established EVAR   History of Present Illness   William Mccalla. is a 61 y.o. ([birth year]) male who presents for routine follow up s/p EVAR (Date: 03/12/16).  Most recent EVAR duplex (Date: 11/09/2016) demonstrates: no endoleak and decreasing sac size.  Most recent CTA (Date: 04/13/16) demonstrates: no endoleak and stable sac size.  The patient has had no back or abdominal pain.  Pt has return to work after chemo, LLL lobectomy and VATS.  Pt notes some decreased work tolerance related decreased pulmonary capacity.  The patient's PMH, PSH, SH, and FamHx are unchanged from 04/27/16.  Current Outpatient Prescriptions  Medication Sig Dispense Refill  . dexamethasone (DECADRON) 4 MG tablet 4 mg by mouth twice a day the day before, day of and day after the chemotherapy every 3 weeks. 40 tablet 0  . folic acid (FOLVITE) 1 MG tablet Take 1 tablet (1 mg total) by mouth daily. 30 tablet 4  . prochlorperazine (COMPAZINE) 10 MG tablet Take 1 tablet (10 mg total) by mouth every 6 (six) hours as needed for nausea or vomiting. (Patient not taking: Reported on 08/14/2016) 30 tablet 0   No current facility-administered medications for this visit.    Facility-Administered Medications Ordered in Other Visits  Medication Dose Route Frequency Provider Last Rate Last Dose  . heparin lock flush 100 unit/mL  500 Units Intracatheter Once PRN Curt Bears, MD      . sodium chloride flush (NS) 0.9 % injection 10 mL  10 mL Intracatheter PRN Curt Bears, MD        On ROS today: SOB with exertion, no abd or back pain   Physical Examination   Vitals:   11/09/16 0953 11/09/16 0956  BP: (!) 151/89 (!) 146/89  Pulse: 78   Resp: 18   Temp: 98.1 F (36.7 C)   TempSrc: Oral   SpO2: 96%   Weight: 155 lb 12.8 oz (70.7 kg)   Height: 6\' 3"  (1.905 m)    Body mass index is 19.47 kg/m.  General Alert, O x 3, WD, NAD  Pulmonary Sym exp, Decreased L>>R air movt, CTA B  Cardiac RRR, Nl S1, S2, no Murmurs,  No rubs, No S3,S4  Vascular Vessel Right Left  Radial Palpable Palpable  Brachial Palpable Palpable  Carotid Palpable, No Bruit Palpable, No Bruit  Aorta Not palpable N/A  Femoral Palpable Palpable  Popliteal Not palpable Not palpable  PT Palpable Palpable  DP Palpable Palpable    Gastro- intestinal soft, non-distended, non-tender to palpation, No guarding or rebound, no HSM, no masses, no CVAT B, No palpable prominent aortic pulse,    Musculo- skeletal M/S 5/5 throughout  , Extremities without ischemic changes  , No edema present, No visible varicosities , No Lipodermatosclerosis present  Neurologic Pain and light touch intact in extremities , Motor exam as listed above    Non-Invasive Vascular Imaging   EVAR Duplex (11/09/2016)  AAA sac size: 4.51 cm    Medical Decision Making   William BINGLEY [name] is a 61 y.o. male who presents s/p EVAR.  Pt is asymptomatic from AAA with decreased sac size, s/p surgical and chemo txt of L lung cancer   I discussed with the patient the importance of surveillance of the endograft.  The next endograft duplex will be scheduled for 12 months.  The next CTA will be scheduled for 6 months.  The patient will follow up with [place] in 6 months with these studies.  Thank you for  allowing [place] to participate in this patient's care.   Adele Barthel, MD, FACS [facility] Office: [phone] Pager: [phone]

## 2016-11-06 ENCOUNTER — Ambulatory Visit (HOSPITAL_BASED_OUTPATIENT_CLINIC_OR_DEPARTMENT_OTHER): Payer: BLUE CROSS/BLUE SHIELD | Admitting: Nurse Practitioner

## 2016-11-06 ENCOUNTER — Encounter: Payer: Self-pay | Admitting: Nurse Practitioner

## 2016-11-06 ENCOUNTER — Other Ambulatory Visit: Payer: Self-pay | Admitting: Medical Oncology

## 2016-11-06 ENCOUNTER — Telehealth: Payer: Self-pay | Admitting: Internal Medicine

## 2016-11-06 ENCOUNTER — Telehealth: Payer: Self-pay | Admitting: Medical Oncology

## 2016-11-06 ENCOUNTER — Ambulatory Visit (HOSPITAL_BASED_OUTPATIENT_CLINIC_OR_DEPARTMENT_OTHER): Payer: BLUE CROSS/BLUE SHIELD

## 2016-11-06 VITALS — BP 138/71 | HR 100 | Temp 98.8°F | Resp 19 | Ht 75.0 in | Wt 156.3 lb

## 2016-11-06 DIAGNOSIS — C3432 Malignant neoplasm of lower lobe, left bronchus or lung: Secondary | ICD-10-CM

## 2016-11-06 DIAGNOSIS — Z85118 Personal history of other malignant neoplasm of bronchus and lung: Secondary | ICD-10-CM | POA: Diagnosis not present

## 2016-11-06 DIAGNOSIS — R0600 Dyspnea, unspecified: Secondary | ICD-10-CM

## 2016-11-06 DIAGNOSIS — R5381 Other malaise: Secondary | ICD-10-CM

## 2016-11-06 DIAGNOSIS — R911 Solitary pulmonary nodule: Secondary | ICD-10-CM

## 2016-11-06 DIAGNOSIS — F411 Generalized anxiety disorder: Secondary | ICD-10-CM

## 2016-11-06 DIAGNOSIS — R0602 Shortness of breath: Secondary | ICD-10-CM | POA: Diagnosis not present

## 2016-11-06 DIAGNOSIS — R0609 Other forms of dyspnea: Secondary | ICD-10-CM

## 2016-11-06 LAB — COMPREHENSIVE METABOLIC PANEL
ALT: 12 U/L (ref 0–55)
AST: 28 U/L (ref 5–34)
Albumin: 3.6 g/dL (ref 3.5–5.0)
Alkaline Phosphatase: 93 U/L (ref 40–150)
Anion Gap: 13 mEq/L — ABNORMAL HIGH (ref 3–11)
BILIRUBIN TOTAL: 0.44 mg/dL (ref 0.20–1.20)
BUN: 22.7 mg/dL (ref 7.0–26.0)
CO2: 20 meq/L — AB (ref 22–29)
Calcium: 9.5 mg/dL (ref 8.4–10.4)
Chloride: 105 mEq/L (ref 98–109)
Creatinine: 1.7 mg/dL — ABNORMAL HIGH (ref 0.7–1.3)
EGFR: 50 mL/min/{1.73_m2} — AB (ref >90)
GLUCOSE: 155 mg/dL — AB (ref 70–140)
Potassium: 4.3 mEq/L (ref 3.5–5.1)
SODIUM: 138 meq/L (ref 136–145)
TOTAL PROTEIN: 7.8 g/dL (ref 6.4–8.3)

## 2016-11-06 LAB — CBC WITH DIFFERENTIAL/PLATELET
BASO%: 0.4 % (ref 0.0–2.0)
Basophils Absolute: 0 10*3/uL (ref 0.0–0.1)
EOS%: 4.9 % (ref 0.0–7.0)
Eosinophils Absolute: 0.3 10*3/uL (ref 0.0–0.5)
HCT: 35.6 % — ABNORMAL LOW (ref 38.4–49.9)
HEMOGLOBIN: 11.8 g/dL — AB (ref 13.0–17.1)
LYMPH%: 17 % (ref 14.0–49.0)
MCH: 31.6 pg (ref 27.2–33.4)
MCHC: 33.2 g/dL (ref 32.0–36.0)
MCV: 95.1 fL (ref 79.3–98.0)
MONO#: 0.9 10*3/uL (ref 0.1–0.9)
MONO%: 13.8 % (ref 0.0–14.0)
NEUT%: 63.9 % (ref 39.0–75.0)
NEUTROS ABS: 4.2 10*3/uL (ref 1.5–6.5)
Platelets: 416 10*3/uL — ABNORMAL HIGH (ref 140–400)
RBC: 3.74 10*6/uL — AB (ref 4.20–5.82)
RDW: 14.7 % — ABNORMAL HIGH (ref 11.0–14.6)
WBC: 6.6 10*3/uL (ref 4.0–10.3)
lymph#: 1.1 10*3/uL (ref 0.9–3.3)

## 2016-11-06 LAB — MAGNESIUM: MAGNESIUM: 2.2 mg/dL (ref 1.5–2.5)

## 2016-11-06 NOTE — Telephone Encounter (Signed)
Gave patient avs report and appointments for August.  °

## 2016-11-06 NOTE — Patient Instructions (Signed)
Due to his recent treatment for lung cancer, Mr. [name] should not work more than 8 consecutive hours in any 24 hour period for the next 6 weeks. Please contact us if you have any questions.   Burns Spain, AOCNP, NP-C / Dr. Fanny Bien.  [place]  [phone]

## 2016-11-06 NOTE — Telephone Encounter (Signed)
Confirmed appt to day per sch msg

## 2016-11-06 NOTE — Progress Notes (Signed)
[facility] Telephone:([phone]) [phone]   Fax:([phone]) [phone]  OFFICE PROGRESS NOTE  [name], MD [place] [address]  DIAGNOSIS: stage IB (T2a, N0, M0) non-small cell lung cancer, adenocarcinoma diagnosed in December 2017.  PRIOR THERAPY:  1) status post left lower lobectomy as well as wedge resection of the left upper lobe on 05/11/2016. 2) Adjuvant systemic chemotherapy with cisplatin 75 MG/M2 and Alimta 500 MG/M2 every 3 weeks. First dose 07/03/2016. Status post 4 cycles.  CURRENT THERAPY: Observation.  INTERVAL HISTORY: William Kales [name] 61 y.o. male returns to the clinic today as a work-in due to increasing shortness of breath. He is currently on observation for this NSCLC. Since his last office visit on October 09, 2016 he returned to work fulltime, and last week worked 16 hours a day, 5 days in a row, including pushing heavy propane tanks [place]. 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  His CBC is normal. He has lost a little bit of weight since his last visit, and he has specifically denied any fevers, chills, sweats,cough, hemoptysis, nausea, vomiting, constipation or diarrhea. He just cannot do as much as he once did and he is fearful this means the cancer is back.   His most recent CT scan was done barely a month ago on 10/05/2016 and showed suspicious 0.8 x 0.6 cm pulmonary nodule in the peripheral left upper lobe abutting the pleura concerning for synchronous primary bronchogenic carcinoma or metastasis.The plan was to repeat a CT scan in  September. I have discussed with him I think it is too soon to repeat the CT scan and I do not see a need based on his clinical symptoms to do a chest xray toda.    MEDICAL HISTORY: Past Medical History:  Diagnosis Date  . AAA (abdominal aortic aneurysm) ([place])   . Cancer ([place])    lung  . Encounter for antineoplastic chemotherapy 06/06/2016  . History of hepatitis B   . HNP (herniated nucleus pulposus), lumbar    L4 with radiculopathy  . Hypertension   . Mass of left lung   . Mitral insufficiency 04/06/2016   This patient will eventually need MV repair if his prognosis is good from oncology standpoint. However, his lung cancer therapy  is the priority right now. His cardiac condition won't preclude possible lung surgery.  Once his lung cancer is under control he will need a TEE to further evaluate his mitral valve anatomy and MR severity, and also have ischemic workup as tere is evidence on calcificati  . Renal insufficiency 10/09/2016  . Varicose veins of legs     ALLERGIES:  is allergic to tramadol.  MEDICATIONS:  Current Outpatient Prescriptions  Medication Sig Dispense Refill  . dexamethasone (DECADRON) 4 MG tablet 4 mg by mouth twice a day the day before, day of and day after the chemotherapy every 3 weeks. 40 tablet 0  . folic acid (FOLVITE) 1 MG tablet Take 1 tablet (  1 mg total) by mouth daily. 30 tablet 4  . prochlorperazine (COMPAZINE) 10 MG tablet Take 1 tablet (10 mg total) by mouth every 6 (six) hours as needed for nausea or vomiting. (Patient not taking: Reported on 08/14/2016) 30 tablet 0   No current facility-administered medications for this visit.    Facility-Administered Medications Ordered in Other Visits  Medication Dose Route Frequency Provider Last Rate Last Dose  . heparin lock flush 100 unit/mL  500 Units Intracatheter Once PRN Curt Bears, MD      . sodium chloride flush (NS) 0.9 % injection 10 mL  10 mL Intracatheter PRN Curt Bears, MD         SURGICAL HISTORY:  Past Surgical History:  Procedure Laterality Date  . ABDOMINAL AORTIC ENDOVASCULAR STENT GRAFT N/A 03/12/2016   Procedure: ABDOMINAL AORTIC ENDOVASCULAR STENT GRAFT;  Surgeon: Conrad [place], MD;  Location: [facility];  Service: Vascular;  Laterality: N/A;  . COLONOSCOPY    . INGUINAL HERNIA REPAIR Left 1975   Left inguinal hernia  . INGUINAL HERNIA REPAIR Right   . LOBECTOMY Left 05/11/2016   Procedure: LEFT LOWER LOBE LOBECTOMY AND LEFT UPPER LOBE  RESECTION AND PLACEMENT OF ON-Q;  Surgeon: [name], MD;  Location: [facility];  Service: Thoracic;  Laterality: Left;  . LUMBAR LAMINECTOMY  October 22, 2012  . LYMPH NODE DISSECTION Left 05/11/2016   Procedure: LYMPH NODE DISSECTION;  Surgeon: [name], MD;  Location: [facility];  Service: Thoracic;  Laterality: Left;  . STAPLING OF BLEBS Left 05/11/2016   Procedure: STAPLING OF APICAL BLEB;  Surgeon: [name], MD;  Location: [facility];  Service: Thoracic;  Laterality: Left;  Marland Kitchen VIDEO ASSISTED THORACOSCOPY Left 05/18/2016   Procedure: VIDEO ASSISTED THORACOSCOPY WITH REMOVAL OF LEFT APICAL BLEB;  Surgeon: [name], MD;  Location: [facility];  Service: Thoracic;  Laterality: Left;  Marland Kitchen VIDEO ASSISTED THORACOSCOPY (VATS)/WEDGE RESECTION Left 05/11/2016   Procedure: LEFT VIDEO ASSISTED THORACOSCOPY (VATS);  Surgeon: [name], MD;  Location: [facility];  Service: Thoracic;  Laterality: Left;  Marland Kitchen VIDEO BRONCHOSCOPY N/A 05/11/2016   Procedure: VIDEO BRONCHOSCOPY, LEFT LUNG;  Surgeon: [name], MD;  Location: [facility];  Service: Thoracic;  Laterality: N/A;  . VIDEO BRONCHOSCOPY N/A 05/18/2016   Procedure: VIDEO BRONCHOSCOPY WITH BRONCHIAL WASHING;  Surgeon: [name], MD;  Location: [facility];  Service: Thoracic;  Laterality: N/A;    REVIEW OF SYSTEMS:  Pertinent items noted in HPI and remainder of comprehensive ROS otherwise negative.   PHYSICAL EXAMINATION: General appearance: alert, cooperative and no  distress Head: Normocephalic, without obvious abnormality, atraumatic Neck: no adenopathy, no JVD, supple, symmetrical, trachea midline and thyroid not enlarged, symmetric, no tenderness/mass/nodules Lymph nodes: Cervical, supraclavicular, and axillary nodes normal. Resp: clear to auscultation bilaterally Back: symmetric, no curvature. ROM normal. No CVA tenderness. Cardio: regular rate and rhythm, S1, S2 normal, no murmur, click, rub or gallop GI: soft, non-tender; bowel sounds normal; no masses,  no organomegaly Extremities: extremities normal, atraumatic, no cyanosis or edema Neurologic: Alert and oriented X 3, normal strength and tone. Normal symmetric reflexes. Normal coordination and gait  ECOG PERFORMANCE STATUS: 1 - Symptomatic but completely ambulatory  There were no vitals taken for this visit.  LABORATORY DATA: Lab Results  Component Value Date   WBC 6.6 11/06/2016   HGB 11.8 (L) 11/06/2016   HCT 35.6 (L) 11/06/2016   MCV 95.1 11/06/2016   PLT 416 (H) 11/06/2016  Chemistry      Component Value Date/Time   NA 140 10/05/2016 0817   K 5.1 10/05/2016 0817   CL 95 (L) 05/20/2016 0400   CO2 24 10/05/2016 0817   BUN 25.4 10/05/2016 0817   CREATININE 1.9 (H) 10/05/2016 0817      Component Value Date/Time   CALCIUM 9.7 10/05/2016 0817   ALKPHOS 90 10/05/2016 0817   AST 35 (H) 10/05/2016 0817   ALT 26 10/05/2016 0817   BILITOT 0.33 10/05/2016 0817       RADIOGRAPHIC STUDIES: No results found.  ASSESSMENT AND PLAN:  This is a very pleasant 61 years old white male with a stage IB non-small cell lung cancer, adenocarcinoma status post wedge resection of the left upper lobe followed by 4 cycles of adjuvant systemic chemotherapy with cisplatin and Alimta and he tolerated his treatment well except for fatigue. Most recent CT scan on 10/05/2016 showed suspicious 0.8 x 0.6 cm pulmonary nodule in the peripheral left upper lobe abutting the pleura concerning for synchronous  primary bronchogenic carcinoma or metastasis.  Due to his anxiety and his deconditioning , I have offered for [place] to see him back in 2 weeks to see if a less rigorous schedule results in his recovering of strength. I provided a letter for him to give to his employers stating he is to work no more than 8 consecutive hours in any 24 hours. I suspect he needs to gradually increase his activity in order to give his lungs time to recover. I have told him if he is doing better in 2 weeks he can call and cancel the appt. He is unaccompanied today, and understandably apprehensive, but relieved to hear that his shortness of breath is not an automatic indication his cancer has returned.   I do think once he tries to gradually increase his activity instead of working 80 hours in 5 days followed by a part time job, that he will be able to increase his lung capacity and tolerance for activity.   He was advised to call immediately if he has any concerning symptoms in the interval. The patient voices understanding of current disease status and treatment options and is in agreement with the current care plan. All questions were answered. The patient knows to call the clinic with any problems, questions or concerns. We can certainly see the patient much sooner if necessary.  Burns Spain, AOCNP, NP-C

## 2016-11-06 NOTE — Telephone Encounter (Signed)
Since last week he is having SOB and has to stop after walking after 50 ft. He wants to see Julien Nordmann -he does not have a PCP. Schedule request sent for NP  today

## 2016-11-08 ENCOUNTER — Encounter: Payer: No Typology Code available for payment source | Admitting: Cardiothoracic Surgery

## 2016-11-09 ENCOUNTER — Encounter: Payer: Self-pay | Admitting: Vascular Surgery

## 2016-11-09 ENCOUNTER — Ambulatory Visit (HOSPITAL_COMMUNITY)
Admission: RE | Admit: 2016-11-09 | Discharge: 2016-11-09 | Disposition: A | Discharge status: Home / Self Care | Payer: No Typology Code available for payment source | Source: Ambulatory Visit | Attending: Vascular Surgery | Admitting: Vascular Surgery

## 2016-11-09 ENCOUNTER — Ambulatory Visit (INDEPENDENT_AMBULATORY_CARE_PROVIDER_SITE_OTHER): Payer: No Typology Code available for payment source | Admitting: Vascular Surgery

## 2016-11-09 VITALS — BP 146/89 | HR 78 | Temp 98.1°F | Resp 18 | Ht 75.0 in | Wt 155.8 lb

## 2016-11-09 DIAGNOSIS — I714 Abdominal aortic aneurysm, without rupture, unspecified: Secondary | ICD-10-CM

## 2016-11-21 ENCOUNTER — Ambulatory Visit: Payer: No Typology Code available for payment source | Admitting: Nurse Practitioner

## 2016-11-23 NOTE — Addendum Note (Signed)
Addended by: Lianne Cure A on: 11/23/2016 09:16 AM   Modules accepted: Orders

## 2016-11-28 ENCOUNTER — Other Ambulatory Visit: Payer: Self-pay | Admitting: Cardiothoracic Surgery

## 2016-11-28 DIAGNOSIS — C3490 Malignant neoplasm of unspecified part of unspecified bronchus or lung: Secondary | ICD-10-CM

## 2016-11-29 ENCOUNTER — Ambulatory Visit (INDEPENDENT_AMBULATORY_CARE_PROVIDER_SITE_OTHER): Payer: No Typology Code available for payment source | Admitting: Cardiothoracic Surgery

## 2016-11-29 ENCOUNTER — Ambulatory Visit
Admission: RE | Admit: 2016-11-29 | Discharge: 2016-11-29 | Disposition: A | Discharge status: Home / Self Care | Payer: No Typology Code available for payment source | Source: Ambulatory Visit | Attending: Cardiothoracic Surgery | Admitting: Cardiothoracic Surgery

## 2016-11-29 ENCOUNTER — Encounter: Payer: Self-pay | Admitting: Cardiothoracic Surgery

## 2016-11-29 VITALS — BP 147/84 | HR 92 | Wt 152.0 lb

## 2016-11-29 DIAGNOSIS — C3490 Malignant neoplasm of unspecified part of unspecified bronchus or lung: Secondary | ICD-10-CM

## 2016-11-29 DIAGNOSIS — C3432 Malignant neoplasm of lower lobe, left bronchus or lung: Secondary | ICD-10-CM

## 2016-11-29 DIAGNOSIS — Z902 Acquired absence of lung [part of]: Secondary | ICD-10-CM | POA: Diagnosis not present

## 2016-11-29 NOTE — Progress Notes (Signed)
[address]       [place] [address]             [phone]      Garcia K Mcgrory [name] [facility] Medical Record #[id number] Date of Birth: [birth year]  Referring: [name], MD Primary Care: [name], No Pcp Per  Chief Complaint:   POST OP FOLLOW UP 05/18/2016 DATE OF DISCHARGE: OPERATIVE REPOR PREOPERATIVE DIAGNOSIS:  Persistent air leak after left lower lobectomy and wedge resection of left upper lobe for stage IB adenocarcinoma of the lung. POSTOPERATIVE DIAGNOSIS:  Persistent air leak after left lower lobectomy and wedge resection of left upper lobe for stage IB adenocarcinoma of the lung. SURGICAL PROCEDURE:  Repeat left video-assisted thoracoscopy and stapling and excision of apical blebs. SURGEON:  Lanelle Bal, MD 05/11/2016 OPERATIVE REPOR PREOPERATIVE DIAGNOSIS:  A 4-cm mass, left lower lobe, biopsy-proven non- small cell carcinoma. POSTOPERATIVE DIAGNOSIS:  A 4-cm mass, left lower lobe, biopsy-proven non-small cell carcinoma. SURGICAL PROCEDURE:  Bronchoscopy; left video-assisted thoracoscopy; mini thoracotomy; left lower lobectomy; wedge resection, left upper lobe; lymph node dissection; placement of On-Q. SURGEON:  Lanelle Bal, MD  Cancer Staging Malignant neoplasm of lower lobe of left lung [facility]) Staging form: Lung, AJCC 8th Edition - Clinical: Stage IIA (cT2b, cN0, cM0) - Signed by [name], MD on 05/15/2016 - Pathologic stage from 05/15/2016: Stage IIA (pT2b, pN0, cM0) - Signed by [name], MD on 05/15/2016  History of Present Illness:     [name] returns to the office stay in follow-up after  lower lobectomy and wedge resection of the left upper lobe for a stage II a moderately differentiated adenocarcinoma of the lung. The [name] had a protracted postop course when the had persistent air leak from her ruptured bleb of the upper lobe and required repeat surgery. He comes in today feeling well without  symptoms of shortness of breath or congestive heart failure.  He he completed 4 rounds of chemotherapy  He remains off cigarettes   Past Medical History:  Diagnosis Date  . AAA (abdominal aortic aneurysm) ([place])   . Encounter for antineoplastic chemotherapy 06/06/2016  . History of hepatitis B   . HNP (herniated nucleus pulposus), lumbar    L4 with radiculopathy  . Hypertension   . Mass of left lung   . Mitral insufficiency 04/06/2016     . Varicose veins of legs      History  Smoking Status  . Former Smoker  . Packs/day: 1.00  . Types: Cigarettes  . Quit date: 03/05/2016  Smokeless Tobacco  . Never Used    History  Alcohol Use  . 3.6 oz/week  . 6 Cans of beer per week     Allergies  Allergen Reactions  . Tramadol Nausea And Vomiting    No current outpatient prescriptions on file.   No current facility-administered medications for this visit.    Facility-Administered Medications Ordered in Other Visits  Medication Dose Route Frequency Provider Last Rate Last Dose  . heparin lock flush 100 unit/mL  500 Units Intracatheter Once PRN Curt Bears, MD      . sodium chloride flush (NS) 0.9 % injection 10 mL  10 mL Intracatheter PRN Curt Bears, MD           Physical Exam: BP (!) 147/84   Pulse 92   Wt 152 lb (68.9 kg)   SpO2 95% Comment: RA  BMI 19.00 kg/m   General appearance: alert, cooperative and  appears older than stated age Neurologic: intact Heart: systolic murmur: holosystolic 3/6, crescendo radiates to back Lungs: clear to auscultation bilaterally Abdomen: soft, non-tender; bowel sounds normal; no masses,  no organomegaly Extremities: extremities normal, atraumatic, no cyanosis or edema and no edema, redness or tenderness in the calves or thighs Wound: [name]'s left chest incisions are healing well   Diagnostic Studies & Laboratory data:     Recent Radiology Findings:   Dg Chest 2 View  Result Date: 11/29/2016 CLINICAL DATA:   History of malignant neoplasm, lung cancer, history of left-sided VATS in 1/18. EXAM: CHEST  2 VIEW COMPARISON:  Prior CT from 10/05/2016. FINDINGS: Mild cardiomegaly, stable. Mediastinal silhouette within normal limits. Aortic atherosclerosis. Lungs are hyperinflated with underlying emphysema. No focal infiltrates. No pulmonary edema. Postsurgical changes present within the left lung. Blunting of the costophrenic angles similar to previous, favored to be related chronic pleural reaction/ scarring. No definite pleural effusion. No pneumothorax. No discrete pulmonary nodule or mass identified. No acute osseus abnormality. IMPRESSION: 1. Postoperative changes within the left lung with no active cardiopulmonary disease identified. 2. Underlying emphysema. 3. Aortic atherosclerosis. Electronically Signed   By: Jeannine Boga M.D.   On: 11/29/2016 13:17    Study Result   CLINICAL DATA:  Stage IB left lower lobe primary bronchogenic adenocarcinoma diagnosed December 2017 status post left lower lobectomy and left upper lobe wedge resection 05/11/2016 and adjuvant systemic chemotherapy, presenting for restaging.  EXAM: CT CHEST WITH CONTRAST  TECHNIQUE: Multidetector CT imaging of the chest was performed during intravenous contrast administration.  CONTRAST:  76mL ISOVUE-300 IOPAMIDOL (ISOVUE-300) INJECTION 61%  COMPARISON:  03/07/2016 PET-CT.  02/16/2016 chest CT.  FINDINGS: Cardiovascular: Top-normal heart size. No significant pericardial fluid/thickening. Atherosclerotic thoracic aorta with stable 4.1 cm ectatic ascending thoracic aorta and slightly increased aneurysmal 4.3 cm proximal descending thoracic aorta (previously 4.1 cm). Normal caliber pulmonary arteries. No central pulmonary emboli.  Mediastinum/Nodes: Stable heterogeneous hypodense 1.4 cm left thyroid lobe nodule. Unremarkable esophagus. No pathologically enlarged axillary, mediastinal or hilar lymph nodes. Lobulated  2.1 x 1.0 cm anterior mediastinal mass with mildly heterogeneous soft tissue density (series 2/ image 96), stable in size and appearance since 02/16/2016 chest CT.  Lungs/Pleura: No pneumothorax. Stable large calcified posterior right pleural plaque. No calcified left pleural plaques. Status post left lower lobectomy and wedge resection in the posterior left upper lobe. Moderate centrilobular and paraseptal emphysema with mild diffuse bronchial wall thickening. Irregular peripheral left upper lobe 8 x 6 mm pulmonary nodule abutting the peripheral pleural surface (series 7/ image 59), increased from 5 x 4 mm on 02/16/2016. Additional small solid pulmonary nodules throughout the right lung measuring up to 5 mm in the subpleural anteromedial right lower lobe (series 7/ image 134) are all stable since 02/16/2016 and probably benign. No additional new significant pulmonary nodules. No acute consolidative airspace disease.  Upper abdomen: Unremarkable.  Musculoskeletal: No aggressive appearing focal osseous lesions. Expected post thoracotomy change in the lateral left seventh and eighth ribs.  IMPRESSION: 1. Interval growth of irregular peripheral left upper lobe 8 x 6 mm pulmonary nodule abutting the pleura, cannot exclude a synchronous primary bronchogenic carcinoma or metastasis. This nodule remains just below PET resolution, and a follow-up chest CT is advised in 3 months. 2. No additional potential findings of metastatic disease in the chest. 3. Indeterminate lobulated 2.1 x 1.0 cm anterior mediastinal mass with heterogeneous soft tissue density, stable since 02/16/2016 chest CT. Thymic neoplasm is on the differential. Surgical consultation and  continued imaging surveillance is warranted. 4. Stable ectatic 4.1 cm ascending thoracic aorta. Slightly increased 4.3 cm proximal descending thoracic aortic aneurysm. 5. Stable nonspecific large right calcified pleural plaque,  which could be due to prior pleural infection or trauma versus asbestos related pleural disease.  Aortic Atherosclerosis (ICD10-I70.0) and Emphysema (ICD10-J43.9).  Aortic aneurysm NOS (ICD10-I71.9).   Electronically Signed   By: Ilona Sorrel M.D.   On: 10/05/2016 10:34     Recent Lab Findings: Lab Results  Component Value Date   WBC 6.6 11/06/2016   HGB 11.8 (L) 11/06/2016   HCT 35.6 (L) 11/06/2016   PLT 416 (H) 11/06/2016   GLUCOSE 155 (H) 11/06/2016   ALT 12 11/06/2016   AST 28 11/06/2016   NA 138 11/06/2016   K 4.3 11/06/2016   CL 95 (L) 05/20/2016   CREATININE 1.7 (H) 11/06/2016   BUN 22.7 11/06/2016   CO2 20 (L) 11/06/2016   INR 1.13 05/18/2016   Diagnosis 1. Foreign body, removal w/tissue, Left Lung Needle Track - FOREIGN MATERIAL. - THERE IS NO EVIDENCE OF MALIGNANCY. 2. Lung, resection (segmental or lobe), Left Lower Lobe w/ part of Upper Lobe - INVASIVE ADENOCARCINOMA, MODERATELY DIFFERENTIATED, SPANNING 4.2 CM. - ADENOCARCINOMA IS PRESENT AT THE STAPLED RESECTION MARGIN OF SPECIMEN #2 AND VASCULAR RESECTION MARGIN OF #2. - LYMPHOVASCULAR INVASION IS IDENTIFIED. - THERE IS NO EVIDENCE OF CARCINOMA IN 2 OF 2 LYMPH NODES (0/2). - SEE ONCOLOGY TABLE BELOW. 3. Lung, wedge biopsy/resection, Frozen section additional Margin (from LLL and part of LUL) - BENIGN LUNG PARENCHYMA WITH INTRA-ALVEOLAR PIGMENT LADEN MACROPHAGES. - THERE IS NO EVIDENCE OF MALIGNANCY. 4. Lymph node, biopsy, 11L - THERE IS NO EVIDENCE OF CARCINOMA IN 1 OF 1 LYMPH NODE (0/1). 5. Lymph node, biopsy, 9L - THERE IS NO EVIDENCE OF CARCINOMA IN 1 OF 1 LYMPH NODE (0/1). 6. Lymph node, biopsy, 8 - THERE IS NO EVIDENCE OF CARCINOMA IN 1 OF 1 LYMPH NODE (0/1). 7. Lymph node, biopsy, 8 #2 - THERE IS NO EVIDENCE OF CARCINOMA IN 1 OF 1 LYMPH NODE (0/1). 8. Lymph node, biopsy, 10L - THERE IS NO EVIDENCE OF CARCINOMA IN 1 OF 1 LYMPH NODE (0/1). 9. Lymph node, biopsy, 4L - THERE IS NO EVIDENCE  OF CARCINOMA IN 1 OF 1 LYMPH NODE (0/1). 10. Lymph node, biopsy, 4L #2 - THERE IS NO EVIDENCE OF CARCINOMA IN 1 OF 1 LYMPH NODE (0/1). 11. Lymph node, biopsy, 2L - THERE IS NO EVIDENCE OF CARCINOMA IN 1 OF 1 LYMPH NODE (0/1). 12. Lymph node, biopsy, 11L #2 - THERE IS NO EVIDENCE OF CARCINOMA IN 1 OF 1 LYMPH NODE (0/1). 13. Lymph node, biopsy, 12L - THERE IS NO EVIDENCE OF CARCINOMA IN 1 OF 1 LYMPH NODE (0/ 12. Lymph node, biopsy, 11L #2 - THERE IS NO EVIDENCE OF CARCINOMA IN 1 OF 1 LYMPH NODE (0/1). 13. Lymph node, biopsy, 12L - THERE IS NO EVIDENCE OF CARCINOMA IN 1 OF 1 LYMPH NODE (0/1). 14. Lung, bleb(s) / bullae, apical - BENIGN LUNG WITH BULLAE FORMATION. - THERE IS NO EVIDENCE OF MALIGNANCY. Microscopic Comment 2. LUNG Specimen, including laterality: Left lung Procedure: LUL and LLL resections(s) and additional parenchymal margin resection with multiple lymph node resections Specimen integrity (intact/disrupted): Intact Tumor site: Lateral portion of specimen #2 Tumor focality: Unifocal Maximum tumor size (cm): At least 4.2 cm Histologic type: Invasive adenocarcinoma Grade: Moderately differentiated Margins: Adenocarcinoma is present at the stapled resection margin of specimen #2 and free floating  carcinoma is present in perivascular tissue in the vascular resection margins of specimen #2. Visceral pleura invasion: Not definitively identified Tumor extension: Confined to lung parenchyma Treatment effect (if treated with neoadjuvant therapy): N/A Lymph -Vascular invasion: Present Lymph nodes: Number examined - 12; Number N1 nodes positive 0; Number N2 nodes positive 0 TNM code: pT2a, pN0 Ancillary studies: Can be performed upon clinician request Non-neoplastic lung: Emphysematous changes and intra-alveolar macrophages. Comments: There is microscopic tumor present in perivascular areas from tissue submitted from the vascular resection margins of specimen #2. (JBK:[id number]  05/15/16)  Assessment / Plan:     1)Stable ectatic 4.1 cm ascending thoracic aorta. Slightly 2) 4.3 cm proximal descending thoracic aortic aneurysm 3) stable Indeterminate lobulated 2.1 x 1.0 cm anterior mediastinal mass 4) Interval growth of irregular peripheral left upper lobe 8 x 6 mmpulmonary nodule abutting the pleura- indeterminate at this time   Agree with short interval repeat CT scan, this is scheduled by oncology for September. Plan to see the [name] back sometime after the scan is completed.     [name] MD      [address] [place] [address] Office [phone]   [place]  11/29/2016 1:23 PM

## 2016-12-14 ENCOUNTER — Other Ambulatory Visit (HOSPITAL_COMMUNITY): Payer: No Typology Code available for payment source

## 2017-01-01 ENCOUNTER — Ambulatory Visit (HOSPITAL_COMMUNITY): Payer: No Typology Code available for payment source | Attending: Cardiology

## 2017-01-01 ENCOUNTER — Other Ambulatory Visit: Payer: Self-pay

## 2017-01-01 DIAGNOSIS — I081 Rheumatic disorders of both mitral and tricuspid valves: Secondary | ICD-10-CM | POA: Insufficient documentation

## 2017-01-01 DIAGNOSIS — I340 Nonrheumatic mitral (valve) insufficiency: Secondary | ICD-10-CM

## 2017-01-01 DIAGNOSIS — I10 Essential (primary) hypertension: Secondary | ICD-10-CM | POA: Diagnosis not present

## 2017-01-01 DIAGNOSIS — I77810 Thoracic aortic ectasia: Secondary | ICD-10-CM | POA: Insufficient documentation

## 2017-01-04 ENCOUNTER — Telehealth: Payer: Self-pay | Admitting: *Deleted

## 2017-01-04 NOTE — Telephone Encounter (Signed)
Oncology Nurse Navigator Documentation  Oncology Nurse Navigator Flowsheets 01/04/2017  Navigator Location [facility]  Navigator Encounter Type Telephone/I followed up on [name] appts. He will see Dr. Julien Nordmann on 9/19. He needs CT scan before he sees him. I called [name] to update him. I gave him the number to call to schedule CT.  He verbalized understanding to call and schedule before 9/19 appt.   Telephone Outgoing Call  Treatment Phase Follow-up  Barriers/Navigation Needs Coordination of Care  Interventions Coordination of Care  Coordination of Care Other  Acuity Level 1  Time Spent with Patient 15

## 2017-01-07 ENCOUNTER — Ambulatory Visit (HOSPITAL_COMMUNITY): Payer: No Typology Code available for payment source

## 2017-01-07 ENCOUNTER — Other Ambulatory Visit: Payer: No Typology Code available for payment source

## 2017-01-08 ENCOUNTER — Ambulatory Visit (HOSPITAL_COMMUNITY): Payer: No Typology Code available for payment source

## 2017-01-08 ENCOUNTER — Other Ambulatory Visit: Payer: No Typology Code available for payment source

## 2017-01-09 ENCOUNTER — Ambulatory Visit: Payer: No Typology Code available for payment source | Admitting: Internal Medicine

## 2017-01-11 ENCOUNTER — Ambulatory Visit (HOSPITAL_COMMUNITY)
Admission: RE | Admit: 2017-01-11 | Discharge: 2017-01-11 | Disposition: A | Discharge status: Home / Self Care | Payer: No Typology Code available for payment source | Source: Ambulatory Visit | Attending: Internal Medicine | Admitting: Internal Medicine

## 2017-01-11 ENCOUNTER — Other Ambulatory Visit (HOSPITAL_BASED_OUTPATIENT_CLINIC_OR_DEPARTMENT_OTHER): Payer: No Typology Code available for payment source

## 2017-01-11 DIAGNOSIS — Z85118 Personal history of other malignant neoplasm of bronchus and lung: Secondary | ICD-10-CM

## 2017-01-11 DIAGNOSIS — I10 Essential (primary) hypertension: Secondary | ICD-10-CM | POA: Diagnosis present

## 2017-01-11 DIAGNOSIS — N289 Disorder of kidney and ureter, unspecified: Secondary | ICD-10-CM

## 2017-01-11 DIAGNOSIS — I700 Atherosclerosis of aorta: Secondary | ICD-10-CM | POA: Diagnosis not present

## 2017-01-11 DIAGNOSIS — I77810 Thoracic aortic ectasia: Secondary | ICD-10-CM | POA: Diagnosis not present

## 2017-01-11 DIAGNOSIS — C3432 Malignant neoplasm of lower lobe, left bronchus or lung: Secondary | ICD-10-CM | POA: Insufficient documentation

## 2017-01-11 DIAGNOSIS — J439 Emphysema, unspecified: Secondary | ICD-10-CM | POA: Diagnosis not present

## 2017-01-11 LAB — COMPREHENSIVE METABOLIC PANEL
ALBUMIN: 4 g/dL (ref 3.5–5.0)
ALT: 27 U/L (ref 0–55)
ANION GAP: 8 meq/L (ref 3–11)
AST: 41 U/L — ABNORMAL HIGH (ref 5–34)
Alkaline Phosphatase: 89 U/L (ref 40–150)
BILIRUBIN TOTAL: 0.49 mg/dL (ref 0.20–1.20)
BUN: 20.2 mg/dL (ref 7.0–26.0)
CALCIUM: 9.3 mg/dL (ref 8.4–10.4)
CO2: 23 meq/L (ref 22–29)
CREATININE: 1.6 mg/dL — AB (ref 0.7–1.3)
Chloride: 108 mEq/L (ref 98–109)
EGFR: 51 mL/min/{1.73_m2} — ABNORMAL LOW (ref >90)
Glucose: 89 mg/dl (ref 70–140)
Potassium: 4.7 mEq/L (ref 3.5–5.1)
Sodium: 138 mEq/L (ref 136–145)
TOTAL PROTEIN: 7.7 g/dL (ref 6.4–8.3)

## 2017-01-11 LAB — CBC WITH DIFFERENTIAL/PLATELET
BASO%: 0.8 % (ref 0.0–2.0)
BASOS ABS: 0.1 10*3/uL (ref 0.0–0.1)
EOS%: 5.4 % (ref 0.0–7.0)
Eosinophils Absolute: 0.3 10*3/uL (ref 0.0–0.5)
HEMATOCRIT: 39 % (ref 38.4–49.9)
HEMOGLOBIN: 13.2 g/dL (ref 13.0–17.1)
LYMPH#: 2 10*3/uL (ref 0.9–3.3)
LYMPH%: 33.1 % (ref 14.0–49.0)
MCH: 30.4 pg (ref 27.2–33.4)
MCHC: 33.8 g/dL (ref 32.0–36.0)
MCV: 89.9 fL (ref 79.3–98.0)
MONO#: 0.6 10*3/uL (ref 0.1–0.9)
MONO%: 9.9 % (ref 0.0–14.0)
NEUT#: 3.1 10*3/uL (ref 1.5–6.5)
NEUT%: 50.8 % (ref 39.0–75.0)
PLATELETS: 216 10*3/uL (ref 140–400)
RBC: 4.34 10*6/uL (ref 4.20–5.82)
RDW: 13.7 % (ref 11.0–14.6)
WBC: 6.2 10*3/uL (ref 4.0–10.3)

## 2017-01-11 LAB — MAGNESIUM: MAGNESIUM: 2.2 mg/dL (ref 1.5–2.5)

## 2017-01-14 ENCOUNTER — Telehealth: Payer: Self-pay | Admitting: Oncology

## 2017-01-14 ENCOUNTER — Ambulatory Visit (HOSPITAL_BASED_OUTPATIENT_CLINIC_OR_DEPARTMENT_OTHER): Payer: No Typology Code available for payment source | Admitting: Oncology

## 2017-01-14 ENCOUNTER — Encounter: Payer: Self-pay | Admitting: Oncology

## 2017-01-14 VITALS — BP 152/79 | HR 77 | Temp 98.7°F | Resp 17 | Ht 75.0 in | Wt 155.8 lb

## 2017-01-14 DIAGNOSIS — Z85118 Personal history of other malignant neoplasm of bronchus and lung: Secondary | ICD-10-CM

## 2017-01-14 DIAGNOSIS — C3432 Malignant neoplasm of lower lobe, left bronchus or lung: Secondary | ICD-10-CM

## 2017-01-14 NOTE — Telephone Encounter (Signed)
Gave avs and calendar for march 2019 

## 2017-01-14 NOTE — Progress Notes (Signed)
[facility] Follow up:    Patient, No Pcp Per No address on file   DIAGNOSIS: Cancer Staging Malignant neoplasm of lower lobe of left lung ([place]) Staging form: Lung, AJCC 8th Edition - Clinical: Stage IIA (cT2b, cN0, cM0) - Signed by [name], MD on 05/15/2016 - Pathologic stage from 05/15/2016: Stage IIA (pT2b, pN0, cM0) - Signed by [name], MD on 05/15/2016  stage IB (T2a, N0, M0) non-small cell lung cancer, adenocarcinoma diagnosed in December 2017.  SUMMARY OF ONCOLOGIC HISTORY: Oncology History   Patient presented with incidental lung mass noted being worked up for AAA     Malignant neoplasm of lower lobe of left lung ([place])   02/16/2016 Imaging    CT Chest/Abd IMPRESSION: Superior segment left lower lobe lung mass, consistent with primary bronchogenic carcinoma. No evidence of thoracic nodal or extra thoracic metastasis.      02/24/2016 Imaging    CTA Subpleural 5 mm pulmonary nodule in the posterior aspect of the right lower lobe.      03/07/2016 Imaging    PET 3.8 x 3.2 cm superior segment left lower lobe lung mass is hypermetabolic and consistent with primary lung neoplasm.      03/29/2016 Procedure    CT Biopsy IMPRESSION: Status post CT-guided biopsy of left lower lobe lung mass      03/29/2016 Pathology Results    Lung, needle/core biopsy(ies), Left lower lobe - ADENOCARCINOMA. The adenocarcinoma appears moderately differentiated      04/05/2016 Initial Diagnosis    Malignant neoplasm of lower lobe of left lung ([place])     PRIOR THERAPY:  1) Status post left lower lobectomy as well as wedge resection of the left upper lobe on 05/11/2016. 2) Adjuvant systemic chemotherapy with cisplatin 75 MG/M2 and Alimta 500 MG/M2 every 3 weeks. First dose 07/03/2016. Status post 4 cycles. Last cycle given on 09/04/2016.  CURRENT THERAPY: Observation.  INTERVAL HISTORY: [name] 61 y.o. male returns for routine follow-up  by himself. The patient is feeling fine with no specific complaints. The patient is working full time. He denies fevers, chills. Denies chest pain, shortness breath, cough, hemoptysis. Denies nausea, vomiting, constipation, diarrhea. He has had some recent weight loss which she attributes to his change in work schedule. Denies night sweats. The patient is here to review his recent CT scan of the chest.   Patient Active Problem List   Diagnosis Date Noted  . Renal insufficiency 10/09/2016  . Hypertension 08/14/2016  . Goals of care, counseling/discussion 06/26/2016  . Encounter for antineoplastic chemotherapy 06/06/2016  . Lung cancer ([place]) 05/18/2016  . S/P partial lobectomy of lung 05/11/2016  . Mitral insufficiency 04/06/2016  . Malignant neoplasm of lower lobe of left lung ([place]) 04/05/2016  . AAA (abdominal aortic aneurysm) ([place]) 03/12/2016  . AAA (abdominal aortic aneurysm) without rupture ([place]) 02/04/2014    is allergic to tramadol.  MEDICAL HISTORY: Past Medical History:  Diagnosis Date  . AAA (abdominal aortic aneurysm) ([place])   . Cancer ([place])    lung  . Encounter for antineoplastic chemotherapy 06/06/2016  . History of hepatitis B   . HNP (herniated nucleus pulposus), lumbar    L4 with radiculopathy  . Hypertension   . Mass of left lung   . Mitral insufficiency 04/06/2016   This patient will eventually need MV repair if his prognosis is good from oncology standpoint. However, his lung cancer therapy  is the priority right now. His cardiac condition  won't preclude possible lung surgery.  Once his lung cancer is under control he will need a TEE to further evaluate his mitral valve anatomy and MR severity, and also have ischemic workup as tere is evidence on calcificati  . Renal insufficiency 10/09/2016  . Varicose veins of legs     SURGICAL HISTORY: Past Surgical History:  Procedure Laterality Date  . ABDOMINAL AORTIC ENDOVASCULAR STENT GRAFT N/A 03/12/2016   Procedure:  ABDOMINAL AORTIC ENDOVASCULAR STENT GRAFT;  Surgeon: Conrad Mason, MD;  Location: [place];  Service: Vascular;  Laterality: N/A;  . COLONOSCOPY    . INGUINAL HERNIA REPAIR Left 1975   Left inguinal hernia  . INGUINAL HERNIA REPAIR Right   . LOBECTOMY Left 05/11/2016   Procedure: LEFT LOWER LOBE LOBECTOMY AND LEFT UPPER LOBE  RESECTION AND PLACEMENT OF ON-Q;  Surgeon: [name], MD;  Location: [place];  Service: Thoracic;  Laterality: Left;  . LUMBAR LAMINECTOMY  October 22, 2012  . LYMPH NODE DISSECTION Left 05/11/2016   Procedure: LYMPH NODE DISSECTION;  Surgeon: [name], MD;  Location: [place];  Service: Thoracic;  Laterality: Left;  . STAPLING OF BLEBS Left 05/11/2016   Procedure: STAPLING OF APICAL BLEB;  Surgeon: [name], MD;  Location: [place];  Service: Thoracic;  Laterality: Left;  Marland Kitchen VIDEO ASSISTED THORACOSCOPY Left 05/18/2016   Procedure: VIDEO ASSISTED THORACOSCOPY WITH REMOVAL OF LEFT APICAL BLEB;  Surgeon: [name], MD;  Location: [place];  Service: Thoracic;  Laterality: Left;  Marland Kitchen VIDEO ASSISTED THORACOSCOPY (VATS)/WEDGE RESECTION Left 05/11/2016   Procedure: LEFT VIDEO ASSISTED THORACOSCOPY (VATS);  Surgeon: [name], MD;  Location: [place];  Service: Thoracic;  Laterality: Left;  Marland Kitchen VIDEO BRONCHOSCOPY N/A 05/11/2016   Procedure: VIDEO BRONCHOSCOPY, LEFT LUNG;  Surgeon: [name], MD;  Location: [place];  Service: Thoracic;  Laterality: N/A;  . VIDEO BRONCHOSCOPY N/A 05/18/2016   Procedure: VIDEO BRONCHOSCOPY WITH BRONCHIAL WASHING;  Surgeon: [name], MD;  Location: [place];  Service: Thoracic;  Laterality: N/A;    SOCIAL HISTORY: Social History   Social History  . Marital status: Married    Spouse name: N/A  . Number of children: N/A  . Years of education: N/A   Occupational History  . Not on file.   Social History Main Topics  . Smoking status: Former Smoker    Packs/day: 1.00    Types: Cigarettes    Quit date: 03/05/2016  .  Smokeless tobacco: Never Used  . Alcohol use 3.6 oz/week    6 Cans of beer per week  . 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 Sexual activity: Not on file   Other Topics Concern  . Not on file   Social History Narrative  . No narrative on file    FAMILY HISTORY: Family History  Problem Relation Age of Onset  . Diabetes Mother     Review of Systems  Constitutional: Negative.   HENT:  Negative.   Eyes: Negative.   Respiratory: Negative.   Cardiovascular: Negative.   Gastrointestinal: Negative.   Genitourinary: Negative.    Musculoskeletal: Negative.   Skin: Negative.   Neurological: Negative.   Hematological: Negative.   Psychiatric/Behavioral: Negative.       PHYSICAL EXAMINATION  ECOG PERFORMANCE STATUS: 0 - Asymptomatic  Vitals:   01/14/17 1253  BP: (!) 152/79  Pulse: 77  Resp: 17  Temp: 98.7 F (37.1 C)  SpO2: 98%    Physical Exam  Constitutional: He is oriented to person, place, and time and well-developed, well-nourished, and in no distress. No distress.  HENT:  Head: Normocephalic.  Mouth/Throat: Oropharynx is clear and moist. No oropharyngeal exudate.  Eyes: Conjunctivae are normal. Right eye exhibits no discharge. Left eye exhibits no discharge. No scleral icterus.  Neck: Normal range of motion. Neck supple.  Cardiovascular: Normal rate, regular rhythm, normal heart sounds and intact distal pulses.   Pulmonary/Chest: Effort normal and breath sounds normal. No respiratory distress. He has no wheezes. He has no rales.  Abdominal: Soft. Bowel sounds are normal. He exhibits no distension and no mass. There is no tenderness.  Musculoskeletal: Normal range of motion. He exhibits no edema.  Lymphadenopathy:    He has no cervical adenopathy.  Neurological: He is alert and oriented to person, place, and time. He exhibits normal muscle tone. Gait normal. Coordination normal.  Skin: Skin is warm and dry. No rash  noted. He is not diaphoretic. No erythema. No pallor.  Psychiatric: Mood, memory, affect and judgment normal.  Vitals reviewed.   LABORATORY DATA:  CBC    Component Value Date/Time   WBC 6.2 01/11/2017 1343   WBC 9.7 05/20/2016 0400   RBC 4.34 01/11/2017 1343   RBC 3.38 (L) 05/20/2016 0400   HGB 13.2 01/11/2017 1343   HCT 39.0 01/11/2017 1343   PLT 216 01/11/2017 1343   MCV 89.9 01/11/2017 1343   MCH 30.4 01/11/2017 1343   MCH 29.9 05/20/2016 0400   MCHC 33.8 01/11/2017 1343   MCHC 33.3 05/20/2016 0400   RDW 13.7 01/11/2017 1343   LYMPHSABS 2.0 01/11/2017 1343   MONOABS 0.6 01/11/2017 1343   EOSABS 0.3 01/11/2017 1343   BASOSABS 0.1 01/11/2017 1343    CMP     Component Value Date/Time   NA 138 01/11/2017 1343   K 4.7 01/11/2017 1343   CL 95 (L) 05/20/2016 0400   CO2 23 01/11/2017 1343   GLUCOSE 89 01/11/2017 1343   BUN 20.2 01/11/2017 1343   CREATININE 1.6 (H) 01/11/2017 1343   CALCIUM 9.3 01/11/2017 1343   PROT 7.7 01/11/2017 1343   ALBUMIN 4.0 01/11/2017 1343   AST 41 (H) 01/11/2017 1343   ALT 27 01/11/2017 1343   ALKPHOS 89 01/11/2017 1343   BILITOT 0.49 01/11/2017 1343   GFRNONAA >60 05/20/2016 0400   GFRAA >60 05/20/2016 0400    RADIOGRAPHIC STUDIES:  Ct Chest Wo Contrast  Result Date: 01/12/2017 CLINICAL DATA:  Followup lung carcinoma.Stage IB left lower lobe primary bronchogenic adenocarcinoma diagnosed December 2017 status post left lower lobectomy and left upper lobe wedge resection 05/11/2016 and adjuvant systemic chemotherapy, presenting for restaging. EXAM: CT CHEST WITHOUT CONTRAST TECHNIQUE: Multidetector CT imaging of the chest was performed following the standard protocol without IV contrast. COMPARISON:  CT 6 15 18  FINDINGS: Cardiovascular: Stable dilatation of the ascending aorta at 40 mm. Descending thoracic aorta measures 41 mm also unchanged. Normal heart size. No pericardial effusion. Mediastinum/Nodes: No axillary or supraclavicular  adenopathy. No mediastinal hilar adenopathy. Pericardial fluid. Esophagus normal. Lungs/Pleura: Resection margins in the LEFT lung parenchyma noted without evidence of new nodularity. Resolution of the peripheral nodule in the LEFT upper lobe described on comparison CT. RIGHT lung clear Upper Abdomen: Limited view of the liver, kidneys, pancreas are unremarkable. Normal adrenal glands. Musculoskeletal: No aggressive osseous lesion all IMPRESSION: 1. Postsurgical change in the LEFT lung without evidence of local recurrence.  2. 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 3. Aortic Atherosclerosis (ICD10-I70.0) and Emphysema (ICD10-J43.9). Electronically Signed   By: Suzy Bouchard M.D.   On: 01/12/2017 19:07    ASSESSMENT and THERAPY PLAN:   Malignant neoplasm of lower lobe of left lung [facility]) This is a very pleasant 61 year old white male with a stage IB non-small cell lung cancer, adenocarcinoma status post left lower lobectomy as well as wedge resection of the left upper lobe. The patient received adjuvant systemic chemotherapy with cisplatin and Alimta status post 4 cycles. He tolerated this treatment well with no significant adverse effects except for fatigue.  The patient was seen with Dr. Julien Nordmann. CT scan results were discussed with the patient. CT scan does not show any evidence of recurrent lung cancer. Recommend continued observation with a repeat CT scan in approximate 6 months.  The patient will return in 6 months for a visit to review CT scan results.   Orders Placed This Encounter  Procedures  . CT CHEST W CONTRAST    Standing Status:   Future    Standing Expiration Date:   01/14/2018    Order Specific Question:   If indicated for the ordered procedure, I authorize the administration of contrast media per Radiology protocol    Answer:   Yes    Order Specific Question:   Preferred imaging location?    Answer:   [facility]    Order  Specific Question:   Radiology Contrast Protocol - do NOT remove file path    Answer:   [email or web address]    Order Specific Question:   Reason for Exam additional comments    Answer:   lung cancer. Restaging.  Marland Kitchen CBC with Differential/Platelet    Standing Status:   Future    Standing Expiration Date:   01/14/2018  . Comprehensive metabolic panel    Standing Status:   Future    Standing Expiration Date:   01/14/2018    All questions were answered. The patient knows to call the clinic with any problems, questions or concerns. We can certainly see the patient much sooner if necessary.  Mikey Bussing, NP 01/14/2017   ADDENDUM: Hematology/Oncology attending: I had a face to face encounter with the patient today. I recommended his care plan. This is a very pleasant 61 years old African-American male with history of stage IB non-small cell lung cancer status post left lower lobectomy with lymph node dissection followed by adjuvant systemic chemotherapy with cisplatin and Alimta completed in May 2018. The patient is currently on observation. He is feeling fine today with no specific complaints. His most recent CT scan of the chest showed no evidence for disease recurrence. I discussed the scan results with the patient today. I recommended for him to continue on observation with repeat CT scan of the chest in 6 months. He was advised to call immediately if he has any concerning symptoms in the interval. Disclaimer: This note was dictated with voice recognition software. Similar sounding words can inadvertently be transcribed and may be missed upon review. Eilleen Kempf, MD 01/14/17

## 2017-01-14 NOTE — Assessment & Plan Note (Signed)
This is a very pleasant 61 year old white male with a stage IB non-small cell lung cancer, adenocarcinoma status post left lower lobectomy as well as wedge resection of the left upper lobe. The patient received adjuvant systemic chemotherapy with cisplatin and Alimta status post 4 cycles. He tolerated this treatment well with no significant adverse effects except for fatigue.  The patient was seen with Dr. Julien Nordmann. CT scan results were discussed with the patient. CT scan does not show any evidence of recurrent lung cancer. Recommend continued observation with a repeat CT scan in approximate 6 months.  The patient will return in 6 months for a visit to review CT scan results.

## 2017-01-22 DIAGNOSIS — Z713 Dietary counseling and surveillance: Secondary | ICD-10-CM | POA: Diagnosis not present

## 2017-01-24 ENCOUNTER — Encounter: Payer: Self-pay | Admitting: Cardiothoracic Surgery

## 2017-01-24 ENCOUNTER — Ambulatory Visit (INDEPENDENT_AMBULATORY_CARE_PROVIDER_SITE_OTHER): Payer: No Typology Code available for payment source | Admitting: Cardiothoracic Surgery

## 2017-01-24 VITALS — BP 144/88 | HR 80 | Ht 75.0 in | Wt 153.0 lb

## 2017-01-24 DIAGNOSIS — I340 Nonrheumatic mitral (valve) insufficiency: Secondary | ICD-10-CM | POA: Diagnosis not present

## 2017-01-24 DIAGNOSIS — I712 Thoracic aortic aneurysm, without rupture, unspecified: Secondary | ICD-10-CM

## 2017-01-24 DIAGNOSIS — Z902 Acquired absence of lung [part of]: Secondary | ICD-10-CM

## 2017-01-24 DIAGNOSIS — C3432 Malignant neoplasm of lower lobe, left bronchus or lung: Secondary | ICD-10-CM

## 2017-01-24 NOTE — Progress Notes (Signed)
[address]       [place] [address]             [phone]      William K Hollopeter [name]  Medical Record #[id number] Date of Birth: 07/03/1955  Referring: [name], MD Primary Care: Patient, No Pcp Per  Chief Complaint:   POST OP FOLLOW UP 05/18/2016 DATE OF DISCHARGE: OPERATIVE REPOR PREOPERATIVE DIAGNOSIS:  Persistent air leak after left lower lobectomy and wedge resection of left upper lobe for stage IB adenocarcinoma of the lung. POSTOPERATIVE DIAGNOSIS:  Persistent air leak after left lower lobectomy and wedge resection of left upper lobe for stage IB adenocarcinoma of the lung. SURGICAL PROCEDURE:  Repeat left video-assisted thoracoscopy and stapling and excision of apical blebs. SURGEON:  Lanelle Bal, MD 05/11/2016 OPERATIVE REPOR PREOPERATIVE DIAGNOSIS:  A 4-cm mass, left lower lobe, biopsy-proven non- small cell carcinoma. POSTOPERATIVE DIAGNOSIS:  A 4-cm mass, left lower lobe, biopsy-proven non-small cell carcinoma. SURGICAL PROCEDURE:  Bronchoscopy; left video-assisted thoracoscopy; mini thoracotomy; left lower lobectomy; wedge resection, left upper lobe; lymph node dissection; placement of On-Q. SURGEON:  Lanelle Bal, MD  Cancer Staging Malignant neoplasm of lower lobe of left lung [facility]) Staging form: Lung, AJCC 8th Edition - Clinical: Stage IIA (cT2b, cN0, cM0) - Signed by [name], MD on 05/15/2016 - Pathologic stage from 05/15/2016: Stage IIA (pT2b, pN0, cM0) - Signed by [name], MD on 05/15/2016  History of Present Illness:     Patient returns to the office stay in follow-up after  lower lobectomy and wedge resection of the left upper lobe for a stage II a moderately differentiated adenocarcinoma of the lung. The patient had a protracted postop course when the had persistent air leak from her ruptured bleb of the upper lobe and required repeat surgery. He remains off cigarettes.  Patient  [place] now [place], his work does not involve heavy lifting but he does do of significant amount of walking and physical activity. He notes he is able to do this without shortness of breath, denies pedal edema denies orthopnea or other signs or symptoms of congestive heart failure,    Past Medical History:  Diagnosis Date  . AAA (abdominal aortic aneurysm) ([place])   . Encounter for antineoplastic chemotherapy 06/06/2016  . History of hepatitis B   . HNP (herniated nucleus pulposus), lumbar    L4 with radiculopathy  . Hypertension   . Mass of left lung   . Mitral insufficiency 04/06/2016     . Varicose veins of legs      History  Smoking Status  . Former Smoker  . Packs/day: 1.00  . Types: Cigarettes  . Quit date: 03/05/2016  Smokeless Tobacco  . Never Used    History  Alcohol Use  . 3.6 oz/week  . 6 Cans of beer per week     Allergies  Allergen Reactions  . Tramadol Nausea And Vomiting    No current outpatient prescriptions on file.   No current facility-administered medications for this visit.    Facility-Administered Medications Ordered in Other Visits  Medication Dose Route Frequency Provider Last Rate Last Dose  . heparin lock flush 100 unit/mL  500 Units Intracatheter Once PRN Curt Bears, MD      . sodium chloride flush (NS) 0.9 % injection 10 mL  10 mL Intracatheter PRN Curt Bears, MD           Physical  Exam: BP (!) 144/88   Pulse 80   Ht 6\' 3"  (1.905 m)   Wt 153 lb (69.4 kg)   SpO2 98%   BMI 19.12 kg/m   General appearance: alert, cooperative and appears older than stated age Neurologic: intact Heart: systolic murmur: holosystolic 3/6, crescendo radiates to back Lungs: clear to auscultation bilaterally Abdomen: soft, non-tender; bowel sounds normal; no masses,  no organomegaly Extremities: extremities normal, atraumatic, no cyanosis or edema and no edema, redness or tenderness in the calves or  thighs Wound: Patient's left chest incisions are healing well   Diagnostic Studies & Laboratory data:     Recent Radiology Findings:  Ct Chest Wo Contrast  Result Date: 01/12/2017 CLINICAL DATA:  Followup lung carcinoma.Stage IB left lower lobe primary bronchogenic adenocarcinoma diagnosed December 2017 status post left lower lobectomy and left upper lobe wedge resection 05/11/2016 and adjuvant systemic chemotherapy, presenting for restaging. EXAM: CT CHEST WITHOUT CONTRAST TECHNIQUE: Multidetector CT imaging of the chest was performed following the standard protocol without IV contrast. COMPARISON:  CT 6 15 18  FINDINGS: Cardiovascular: Stable dilatation of the ascending aorta at 40 mm. Descending thoracic aorta measures 41 mm also unchanged. Normal heart size. No pericardial effusion. Mediastinum/Nodes: No axillary or supraclavicular adenopathy. No mediastinal hilar adenopathy. Pericardial fluid. Esophagus normal. Lungs/Pleura: Resection margins in the LEFT lung parenchyma noted without evidence of new nodularity. Resolution of the peripheral nodule in the LEFT upper lobe described on comparison CT. RIGHT lung clear Upper Abdomen: Limited view of the liver, kidneys, pancreas are unremarkable. Normal adrenal glands. Musculoskeletal: No aggressive osseous lesion all IMPRESSION: 1. Postsurgical change in the LEFT lung without evidence of local recurrence. 2. 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 3. Aortic Atherosclerosis (ICD10-I70.0) and Emphysema (ICD10-J43.9). Electronically Signed   By: Suzy Bouchard M.D.   On: 01/12/2017 19:07   Study Result   CLINICAL DATA:  Stage IB left lower lobe primary bronchogenic adenocarcinoma diagnosed December 2017 status post left lower lobectomy and left upper lobe wedge resection 05/11/2016 and adjuvant systemic chemotherapy, presenting for restaging.  EXAM: CT CHEST WITH CONTRAST  TECHNIQUE: Multidetector CT  imaging of the chest was performed during intravenous contrast administration.  CONTRAST:  59mL ISOVUE-300 IOPAMIDOL (ISOVUE-300) INJECTION 61%  COMPARISON:  03/07/2016 PET-CT.  02/16/2016 chest CT.  FINDINGS: Cardiovascular: Top-normal heart size. No significant pericardial fluid/thickening. Atherosclerotic thoracic aorta with stable 4.1 cm ectatic ascending thoracic aorta and slightly increased aneurysmal 4.3 cm proximal descending thoracic aorta (previously 4.1 cm). Normal caliber pulmonary arteries. No central pulmonary emboli.  Mediastinum/Nodes: Stable heterogeneous hypodense 1.4 cm left thyroid lobe nodule. Unremarkable esophagus. No pathologically enlarged axillary, mediastinal or hilar lymph nodes. Lobulated 2.1 x 1.0 cm anterior mediastinal mass with mildly heterogeneous soft tissue density (series 2/ image 96), stable in size and appearance since 02/16/2016 chest CT.  Lungs/Pleura: No pneumothorax. Stable large calcified posterior right pleural plaque. No calcified left pleural plaques. Status post left lower lobectomy and wedge resection in the posterior left upper lobe. Moderate centrilobular and paraseptal emphysema with mild diffuse bronchial wall thickening. Irregular peripheral left upper lobe 8 x 6 mm pulmonary nodule abutting the peripheral pleural surface (series 7/ image 59), increased from 5 x 4 mm on 02/16/2016. Additional small solid pulmonary nodules throughout the right lung measuring up to 5 mm in the subpleural anteromedial right lower lobe (series 7/ image 134) are all stable since 02/16/2016 and probably benign. No additional new significant pulmonary nodules. No acute consolidative airspace  disease.  Upper abdomen: Unremarkable.  Musculoskeletal: No aggressive appearing focal osseous lesions. Expected post thoracotomy change in the lateral left seventh and eighth ribs.  IMPRESSION: 1. Interval growth of irregular peripheral left upper  lobe 8 x 6 mm pulmonary nodule abutting the pleura, cannot exclude a synchronous primary bronchogenic carcinoma or metastasis. This nodule remains just below PET resolution, and a follow-up chest CT is advised in 3 months. 2. No additional potential findings of metastatic disease in the chest. 3. Indeterminate lobulated 2.1 x 1.0 cm anterior mediastinal mass with heterogeneous soft tissue density, stable since 02/16/2016 chest CT. Thymic neoplasm is on the differential. Surgical consultation and continued imaging surveillance is warranted. 4. Stable ectatic 4.1 cm ascending thoracic aorta. Slightly increased 4.3 cm proximal descending thoracic aortic aneurysm. 5. Stable nonspecific large right calcified pleural plaque, which could be due to prior pleural infection or trauma versus asbestos related pleural disease.  Aortic Atherosclerosis (ICD10-I70.0) and Emphysema (ICD10-J43.9).  Aortic aneurysm NOS (ICD10-I71.9).   Electronically Signed   By: Ilona Sorrel M.D.   On: 10/05/2016 10:34     Recent Lab Findings: Lab Results  Component Value Date   WBC 6.2 01/11/2017   HGB 13.2 01/11/2017   HCT 39.0 01/11/2017   PLT 216 01/11/2017   GLUCOSE 89 01/11/2017   ALT 27 01/11/2017   AST 41 (H) 01/11/2017   NA 138 01/11/2017   K 4.7 01/11/2017   CL 95 (L) 05/20/2016   CREATININE 1.6 (H) 01/11/2017   BUN 20.2 01/11/2017   CO2 23 01/11/2017   INR 1.13 05/18/2016   Diagnosis 1. Foreign body, removal w/tissue, Left Lung Needle Track - FOREIGN MATERIAL. - THERE IS NO EVIDENCE OF MALIGNANCY. 2. Lung, resection (segmental or lobe), Left Lower Lobe w/ part of Upper Lobe - INVASIVE ADENOCARCINOMA, MODERATELY DIFFERENTIATED, SPANNING 4.2 CM. - ADENOCARCINOMA IS PRESENT AT THE STAPLED RESECTION MARGIN OF SPECIMEN #2 AND VASCULAR RESECTION MARGIN OF #2. - LYMPHOVASCULAR INVASION IS IDENTIFIED. - THERE IS NO EVIDENCE OF CARCINOMA IN 2 OF 2 LYMPH NODES (0/2). - SEE ONCOLOGY TABLE  BELOW. 3. Lung, wedge biopsy/resection, Frozen section additional Margin (from LLL and part of LUL) - BENIGN LUNG PARENCHYMA WITH INTRA-ALVEOLAR PIGMENT LADEN MACROPHAGES. - THERE IS NO EVIDENCE OF MALIGNANCY. 4. Lymph node, biopsy, 11L - THERE IS NO EVIDENCE OF CARCINOMA IN 1 OF 1 LYMPH NODE (0/1). 5. Lymph node, biopsy, 9L - THERE IS NO EVIDENCE OF CARCINOMA IN 1 OF 1 LYMPH NODE (0/1). 6. Lymph node, biopsy, 8 - THERE IS NO EVIDENCE OF CARCINOMA IN 1 OF 1 LYMPH NODE (0/1). 7. Lymph node, biopsy, 8 #2 - THERE IS NO EVIDENCE OF CARCINOMA IN 1 OF 1 LYMPH NODE (0/1). 8. Lymph node, biopsy, 10L - THERE IS NO EVIDENCE OF CARCINOMA IN 1 OF 1 LYMPH NODE (0/1). 9. Lymph node, biopsy, 4L - THERE IS NO EVIDENCE OF CARCINOMA IN 1 OF 1 LYMPH NODE (0/1). 10. Lymph node, biopsy, 4L #2 - THERE IS NO EVIDENCE OF CARCINOMA IN 1 OF 1 LYMPH NODE (0/1). 11. Lymph node, biopsy, 2L - THERE IS NO EVIDENCE OF CARCINOMA IN 1 OF 1 LYMPH NODE (0/1). 12. Lymph node, biopsy, 11L #2 - THERE IS NO EVIDENCE OF CARCINOMA IN 1 OF 1 LYMPH NODE (0/1). 13. Lymph node, biopsy, 12L - THERE IS NO EVIDENCE OF CARCINOMA IN 1 OF 1 LYMPH NODE (0/ 12. Lymph node, biopsy, 11L #2 - THERE IS NO EVIDENCE OF CARCINOMA IN 1 OF 1 LYMPH NODE (  0/1). 13. Lymph node, biopsy, 12L - THERE IS NO EVIDENCE OF CARCINOMA IN 1 OF 1 LYMPH NODE (0/1). 14. Lung, bleb(s) / bullae, apical - BENIGN LUNG WITH BULLAE FORMATION. - THERE IS NO EVIDENCE OF MALIGNANCY. Microscopic Comment 2. LUNG Specimen, including laterality: Left lung Procedure: LUL and LLL resections(s) and additional parenchymal margin resection with multiple lymph node resections Specimen integrity (intact/disrupted): Intact Tumor site: Lateral portion of specimen #2 Tumor focality: Unifocal Maximum tumor size (cm): At least 4.2 cm Histologic type: Invasive adenocarcinoma Grade: Moderately differentiated Margins: Adenocarcinoma is present at the stapled resection margin of  specimen #2 and free floating carcinoma is present in perivascular tissue in the vascular resection margins of specimen #2. Visceral pleura invasion: Not definitively identified Tumor extension: Confined to lung parenchyma Treatment effect (if treated with neoadjuvant therapy): N/A Lymph -Vascular invasion: Present Lymph nodes: Number examined - 12; Number N1 nodes positive 0; Number N2 nodes positive 0 TNM code: pT2a, pN0 Ancillary studies: Can be performed upon clinician request Non-neoplastic lung: Emphysematous changes and intra-alveolar macrophages. Comments: There is microscopic tumor present in perivascular areas from tissue submitted from the vascular resection margins of specimen #2. (JBK:kh 05/15/16)  ECHO: Echocardiography  Patient:    [name], [name] MR #:       [id number] Study Date: 01/01/2017 Gender:     M Age:        61 Height:     190.5 cm Weight:     68.9 kg BSA:        1.89 m^2 Pt. Status: Room:   ATTENDING    Candee Furbish, M.D.  SONOGRAPHER  Marygrace Drought, RCS  ORDERING     [name], M.D.  REFERRING    [name], M.D.  PERFORMING   [facility], Outpatient  cc:  ------------------------------------------------------------------- LV EF: 60% -   65%  ------------------------------------------------------------------- Indications:      MVD (I34.0).  ------------------------------------------------------------------- History:   PMH:  AAA, Malignant neoplasm of left lung.  Risk factors:  Hypertension.  ------------------------------------------------------------------- Study Conclusions  - Left ventricle: The cavity size was normal. Systolic function was   normal. The estimated ejection fraction was in the range of 60%   to 65%. Wall motion was normal; there were no regional wall   motion abnormalities. - Aorta: Aortic root dimension: 46 mm (ED). - Ascending aorta: The ascending aorta was moderately dilated. - Mitral valve: Severe  thickening of the anterior leaflet. Severe   prolapse, involving the anterior leaflet. There was moderate to   severe regurgitation directed eccentrically and posteriorly. - Left atrium: The atrium was mildly dilated. Volume/bsa, ES,   (1-plane Simpson&'s, A2C): 32.7 ml/m^2. - Tricuspid valve: Moderate leaflet thickening. - Pulmonary arteries: PA peak pressure: 32 mm Hg (S).  Impressions:  - Compared to the prior study, there has been no significant   interval change.  ------------------------------------------------------------------- Study data:   Study status:  Routine.  Procedure:  The patient reported no pain pre or post test. Transthoracic echocardiography. Image quality was adequate.          Echocardiography.  M-mode, complete 2D, spectral Doppler, and color Doppler.  Birthdate: Patient birthdate: 08-23-1955.  Age:  Patient is 61 yr old.  Sex: Gender: male.    BMI: 19 kg/m^2.  Blood pressure:     151/95 Patient status:  Outpatient.  Study date:  Study date: 01/01/2017. Study time: 03:03 PM.  Location:  [facility]  -------------------------------------------------------------------  ------------------------------------------------------------------- Left ventricle:  The cavity size was  normal. Systolic function was normal. The estimated ejection fraction was in the range of 60% to 65%. Wall motion was normal; there were no regional wall motion abnormalities.  ------------------------------------------------------------------- Aortic valve:   Trileaflet; normal thickness leaflets. Mobility was not restricted.  Doppler:  Transvalvular velocity was within the normal range. There was no stenosis. There was no regurgitation.   ------------------------------------------------------------------- Aorta:  Ascending aorta: The ascending aorta was moderately dilated.  ------------------------------------------------------------------- Mitral valve:   Severe  thickening of the anterior leaflet. Mobility was not restricted.  Severe prolapse, involving the anterior leaflet.  Doppler:  Transvalvular velocity was within the normal range. There was no evidence for stenosis. There was moderate to severe regurgitation directed eccentrically and posteriorly. Valve area by pressure half-time: 3.49 cm^2. Indexed valve area by pressure half-time: 1.84 cm^2/m^2.  ------------------------------------------------------------------- Left atrium:  The atrium was mildly dilated.  ------------------------------------------------------------------- Right ventricle:  The cavity size was normal. Wall thickness was normal. Systolic function was normal.  ------------------------------------------------------------------- Pulmonic valve:    Doppler:  Transvalvular velocity was within the normal range. There was no evidence for stenosis.  ------------------------------------------------------------------- Tricuspid valve:   Moderate leaflet thickening.  Doppler: Transvalvular velocity was within the normal range. There was mild regurgitation.  ------------------------------------------------------------------- Pulmonary artery:   The main pulmonary artery was normal-sized. Systolic pressure was within the normal range.  ------------------------------------------------------------------- Right atrium:  The atrium was normal in size.  ------------------------------------------------------------------- Pericardium:  There was no pericardial effusion.  ------------------------------------------------------------------- Systemic veins: Inferior vena cava: The vessel was normal in size. The respirophasic diameter changes were in the normal range (= 50%), consistent with normal central venous pressure. Diameter: 8 mm.  ------------------------------------------------------------------- Measurements   IVC                                       Value           Reference  ID                                        8     mm       ---------    Left ventricle                            Value          Reference  LV ID, ED, PLAX chordal                   45    mm       43 - 52  LV ID, ES, PLAX chordal                   33    mm       23 - 38  LV fx shortening, PLAX chordal    (L)     27    %        >=29  LV PW thickness, ED                       16    mm       ---------  IVS/LV PW ratio, ED  0.88           <=1.3  Stroke volume, 2D                         70    ml       ---------  Stroke volume/bsa, 2D                     37    ml/m^2   ---------  LV e&', lateral                            6.74  cm/s     ---------  LV E/e&', lateral                          10.43          ---------  LV e&', medial                             5     cm/s     ---------  LV E/e&', medial                           14.06          ---------  LV e&', average                            5.87  cm/s     ---------  LV E/e&', average                          11.98          ---------    Ventricular septum                        Value          Reference  IVS thickness, ED                         14    mm       ---------    LVOT                                      Value          Reference  LVOT ID, S                                24    mm       ---------  LVOT area                                 4.52  cm^2     ---------  LVOT peak velocity, S                     93.8  cm/s     ---------  LVOT mean velocity, S  56.8  cm/s     ---------  LVOT VTI, S                               15.5  cm       ---------    Aorta                                     Value          Reference  Aortic root ID, ED                        46    mm       ---------    Left atrium                               Value          Reference  LA ID, A-P, ES                            34    mm       ---------  LA ID/bsa, A-P                            1.79  cm/m^2    <=2.2  LA volume, S                              63.6  ml       ---------  LA volume/bsa, S                          33.6  ml/m^2   ---------  LA volume, ES, 1-p A4C                    54.5  ml       ---------  LA volume/bsa, ES, 1-p A4C                28.8  ml/m^2   ---------  LA volume, ES, 1-p A2C                    61.9  ml       ---------  LA volume/bsa, ES, 1-p A2C                32.7  ml/m^2   ---------    Mitral valve                              Value          Reference  Mitral E-wave peak velocity               70.3  cm/s     ---------  Mitral A-wave peak velocity               74.2  cm/s     ---------  Mitral deceleration time                  215   ms  150 - 230  Mitral pressure half-time                 63    ms       ---------  Mitral E/A ratio, peak                    0.9            ---------  Mitral valve area, PHT, DP                3.49  cm^2     ---------  Mitral valve area/bsa, PHT, DP            1.84  cm^2/m^2 ---------  Mitral regurg VTI, PISA                   174   cm       ---------    Pulmonary arteries                        Value          Reference  PA pressure, S, DP                (H)     32    mm Hg    <=30    Tricuspid valve                           Value          Reference  Tricuspid regurg peak velocity            270   cm/s     ---------  Tricuspid peak RV-RA gradient             29    mm Hg    ---------  Tricuspid maximal regurg                  270   cm/s     ---------  velocity, PISA    Systemic veins                            Value          Reference  Estimated CVP                             3     mm Hg    ---------    Right ventricle                           Value          Reference  TAPSE                                     27.3  mm       ---------  RV pressure, S, DP                (H)     32    mm Hg    <=30  RV s&', lateral, S                         8.93  cm/s     ---------  Legend: (L)  and  (H)  mark values outside specified  reference range.  ------------------------------------------------------------------- Prepared and Electronically Authenticated by  Candee Furbish, M.D. 2018-09-11T17:29:02 Aortic Size Index=     4.1    /Body surface area is 1.92 meters squared. = 2.13 < 2.75 cm/m2      4% risk per year 2.75 to 4.25          8% risk per year > 4.25 cm/m2    20% risk per year     Assessment / Plan:     1)Stable ectatic 4.1 cm ascending thoracic aorta.  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 He is status post abdominal aortic aneurysm repair. Currently is asymptomatic from his known moderate to severe mitral regurgitation. Plan to see him back in late June 2019. Follow-up CT of the chest is ordered by oncology for March 2019.  Patient was warned about not using Cipro and similar antibiotics. Recent studies have raised concern that fluoroquinolone antibiotics could be associated with an increased risk of aortic aneurysm Fluoroquinolones have non-antimicrobial properties that might jeopardise the integrity of the extracellular matrix of the vascular wall In a  propensity score matched cohort study in [place], there was a 66% increased rate of aortic aneurysm or dissection associated with oral fluoroquinolone use, compared with amoxicillin use, within a 60 day risk period from start of treatment    [name] MD      [employer] [place] [address] Office [phone]   Beeper [phone]  01/24/2017 10:38 AM

## 2017-01-24 NOTE — Patient Instructions (Signed)

## 2017-01-29 DIAGNOSIS — S62336A Displaced fracture of neck of fifth metacarpal bone, right hand, initial encounter for closed fracture: Secondary | ICD-10-CM | POA: Diagnosis not present

## 2017-01-29 DIAGNOSIS — S61411A Laceration without foreign body of right hand, initial encounter: Secondary | ICD-10-CM | POA: Diagnosis not present

## 2017-01-29 DIAGNOSIS — W25XXXA Contact with sharp glass, initial encounter: Secondary | ICD-10-CM | POA: Diagnosis not present

## 2017-01-31 DIAGNOSIS — M25541 Pain in joints of right hand: Secondary | ICD-10-CM | POA: Diagnosis not present

## 2017-01-31 DIAGNOSIS — S61216A Laceration without foreign body of right little finger without damage to nail, initial encounter: Secondary | ICD-10-CM | POA: Diagnosis not present

## 2017-02-06 ENCOUNTER — Encounter: Payer: Self-pay | Admitting: Cardiology

## 2017-02-13 DIAGNOSIS — H6123 Impacted cerumen, bilateral: Secondary | ICD-10-CM | POA: Diagnosis not present

## 2017-02-15 DIAGNOSIS — M25541 Pain in joints of right hand: Secondary | ICD-10-CM | POA: Diagnosis not present

## 2017-02-20 ENCOUNTER — Ambulatory Visit (INDEPENDENT_AMBULATORY_CARE_PROVIDER_SITE_OTHER): Payer: No Typology Code available for payment source | Admitting: Cardiology

## 2017-02-20 VITALS — BP 124/70 | HR 69 | Ht 75.0 in | Wt 157.0 lb

## 2017-02-20 DIAGNOSIS — I714 Abdominal aortic aneurysm, without rupture, unspecified: Secondary | ICD-10-CM

## 2017-02-20 DIAGNOSIS — I341 Nonrheumatic mitral (valve) prolapse: Secondary | ICD-10-CM

## 2017-02-20 DIAGNOSIS — I340 Nonrheumatic mitral (valve) insufficiency: Secondary | ICD-10-CM

## 2017-02-20 DIAGNOSIS — I10 Essential (primary) hypertension: Secondary | ICD-10-CM

## 2017-02-20 MED ORDER — LOSARTAN POTASSIUM 25 MG PO TABS: 25.0000 mg | ORAL_TABLET | Freq: Every day | ORAL | 3 refills | Status: DC

## 2017-02-20 NOTE — Progress Notes (Signed)
Cardiology Office Note    Date:  02/20/2017   ID:  William Kales [name], DOB 1956-03-01, MRN [id number]  PCP:  Patient, No Pcp Per  Cardiologist:  William Dawley, MD   Chief complain: SOB  History of Present Illness:  [name]. is a 61 y.o. male , very pleasant patient with a history of of AAA s/p stent graft in 02/2016, 40 PPY history of tobacco abuse, diagnosed with a left lung mass on PFT and PET scan. 8 x 3.2 cm superior segment left lower lobe lung mass is hypermetabolic and consistent with primary lung neoplasm. No mediastinal or hilar lymphadenopathy and no findings to suggest metastatic disease. 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 First dose 07/03/2016, scheduled till May 2018.  Malignant neoplasm of lower lobe of left lung ([place]) Staging form: Lung, AJCC 8th Edition - Clinical: Stage IIA (cT2b, cN0, cM0) - Signed by [name], MD on 05/15/2016 - Pathologic stage from 05/15/2016: Stage IIA (pT2b, pN0, cM0)  07/13/2016 - the patient states that prior to all of these diagnoses he was working 16 hours a day. 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 He was able to work manually all day without any symptoms of shortness of breath no chest pain no palpitation dizziness no syncope. Now post lung lobectomy and chemotherapy he feels some mild shortness of breath but still able to work full time also denies lower extremity edema orthopnea or proximal nocturnal dyspnea.  02/20/2017 - this is 6 months follow-up, the patient developed symptoms of CHF in July and had to quit one of his 2 jobs, however states that now back to baseline. 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 He has completed chemotherapy and is currently in remission being  followed by oncology.  He denies any palpitations, no chest pain no recent orthopnea proximal nocturnal dyspnea or lower extremity edema.  He felt while at work 3 weeks ago his right hand.  Past Medical History:  Diagnosis Date  . AAA (abdominal aortic aneurysm) ([name])   . Cancer ([name])    lung  . Encounter for antineoplastic chemotherapy 06/06/2016  . History of hepatitis B   . HNP (herniated nucleus pulposus), lumbar    L4 with radiculopathy  . Hypertension   . Mass of left lung   . Mitral insufficiency 04/06/2016   This patient will eventually need MV repair if his prognosis is good from oncology standpoint. However, his lung cancer therapy  is the priority right now. His cardiac condition won't preclude possible lung surgery.  Once his lung cancer is under control he will need a TEE to further evaluate his mitral valve anatomy and MR severity, and also have ischemic workup as tere is evidence on calcificati  . Renal insufficiency 10/09/2016  . Varicose veins of legs     Past Surgical History:  Procedure Laterality Date  . ABDOMINAL AORTIC ENDOVASCULAR STENT GRAFT N/A 03/12/2016   Procedure: ABDOMINAL AORTIC ENDOVASCULAR STENT GRAFT;  Surgeon: [name], MD;  Location: [facility];  Service: Vascular;  Laterality: N/A;  . COLONOSCOPY    . INGUINAL HERNIA REPAIR Left 1975   Left inguinal hernia  . INGUINAL HERNIA REPAIR Right   . LOBECTOMY Left 05/11/2016   Procedure: LEFT  LOWER LOBE LOBECTOMY AND LEFT UPPER LOBE  RESECTION AND PLACEMENT OF ON-Q;  Surgeon: [name], MD;  Location: [facility];  Service: Thoracic;  Laterality: Left;  . LUMBAR LAMINECTOMY  October 22, 2012  . LYMPH NODE DISSECTION Left 05/11/2016   Procedure: LYMPH NODE DISSECTION;  Surgeon: [name], MD;  Location: [facility];  Service: Thoracic;  Laterality: Left;  . STAPLING OF BLEBS Left 05/11/2016   Procedure: STAPLING OF APICAL BLEB;  Surgeon: [name], MD;  Location: [facility];  Service: Thoracic;  Laterality:  Left;  Marland Kitchen VIDEO ASSISTED THORACOSCOPY Left 05/18/2016   Procedure: VIDEO ASSISTED THORACOSCOPY WITH REMOVAL OF LEFT APICAL BLEB;  Surgeon: [name], MD;  Location: [facility];  Service: Thoracic;  Laterality: Left;  Marland Kitchen VIDEO ASSISTED THORACOSCOPY (VATS)/WEDGE RESECTION Left 05/11/2016   Procedure: LEFT VIDEO ASSISTED THORACOSCOPY (VATS);  Surgeon: [name], MD;  Location: [facility];  Service: Thoracic;  Laterality: Left;  Marland Kitchen VIDEO BRONCHOSCOPY N/A 05/11/2016   Procedure: VIDEO BRONCHOSCOPY, LEFT LUNG;  Surgeon: [name], MD;  Location: [facility];  Service: Thoracic;  Laterality: N/A;  . VIDEO BRONCHOSCOPY N/A 05/18/2016   Procedure: VIDEO BRONCHOSCOPY WITH BRONCHIAL WASHING;  Surgeon: [name], MD;  Location: [facility];  Service: Thoracic;  Laterality: N/A;    Current Medications: No outpatient prescriptions prior to visit.   Facility-Administered Medications Prior to Visit  Medication Dose Route Frequency Provider Last Rate Last Dose  . heparin lock flush 100 unit/mL  500 Units Intracatheter Once PRN William Bears, MD      . sodium chloride flush (NS) 0.9 % injection 10 mL  10 mL Intracatheter PRN William Bears, MD         Allergies:   Tramadol   Social History   Social History  . Marital status: Married    Spouse name: N/A  . Number of children: N/A  . Years of education: N/A   Social History Main Topics  . Smoking status: Former Smoker    Packs/day: 1.00    Types: Cigarettes    Quit date: 03/05/2016  . Smokeless tobacco: Never Used  . Alcohol use 3.6 oz/week    6 Cans of beer per week  . 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 Sexual activity: Not on file   Other Topics Concern  . Not on file   Social History Narrative  . No narrative on file     Family History:  The patient's family history includes Diabetes in his mother.   ROS:   Please see the history of present illness.    ROS All other systems  reviewed and are negative.   PHYSICAL EXAM:   VS:  BP 124/70   Pulse 69   Ht 6\' 3"  (1.905 m)   Wt 157 lb (71.2 kg)   BMI 19.62 kg/m    GEN: Well nourished, well developed, in no acute distress  HEENT: normal  Neck: no JVD, carotid bruits, or masses Cardiac: RRR; 4/6 systolic murmurs, rubs, or gallops,no edema  Respiratory:  clear to auscultation bilaterally, normal work of breathing GI: soft, nontender, nondistended, + BS MS: no deformity or atrophy  Skin: warm and dry, no rash Neuro:  Alert and Oriented x 3, Strength and sensation are intact Psych: euthymic mood, full affect  Wt Readings from Last 3 Encounters:  02/20/17 157 lb (71.2 kg)  01/24/17 153 lb (  69.4 kg)  01/14/17 155 lb 12.8 oz (70.7 kg)      Studies/Labs Reviewed:   EKG:  EKG is ordered today.  It shows normal sinus rhythm with LVH, early repolarization unchanged from prior.  Recent Labs: 01/11/2017: ALT 27; BUN 20.2; Creatinine 1.6; HGB 13.2; Magnesium 2.2; Platelets 216; Potassium 4.7; Sodium 138   Lipid Panel No results found for: CHOL, TRIG, HDL, CHOLHDL, VLDL, LDLCALC, LDLDIRECT  Additional studies/ records that were reviewed today include:   TTE: 02/2016 - Left ventricle: The cavity size was normal. Wall thickness was   increased in a pattern of moderate LVH. Systolic function was   normal. The estimated ejection fraction was in the range of 60%   to 65%. - Mitral valve: Anterior leaflet prolapse with likley moderate   eccentric posteriorly directed MR   Anteiror leaflet is very thickend and myxomatous Cannot r/o   parachute MV doubt   vegetation consider TEE if clinically indicated - Atrial septum: No defect or patent foramen ovale was identified.  TTE: 01/01/2017 - Left ventricle: The cavity size was normal. Systolic function was   normal. The estimated ejection fraction was in the range of 60%   to 65%. Wall motion was normal; there were no regional wall   motion abnormalities. - Aorta:  Aortic root dimension: 46 mm (ED). - Ascending aorta: The ascending aorta was moderately dilated. - Mitral valve: Severe thickening of the anterior leaflet. Severe   prolapse, involving the anterior leaflet. There was moderate to   severe regurgitation directed eccentrically and posteriorly. - Left atrium: The atrium was mildly dilated. Volume/bsa, ES,   (1-plane Simpson&'s, A2C): 32.7 ml/m^2. - Tricuspid valve: Moderate leaflet thickening. - Pulmonary arteries: PA peak pressure: 32 mm Hg (S).  Impressions:  - Compared to the prior study, there has been no significant   interval change.  Cardiopulmonary exercise test.  January 2018 Conclusion:Exercise testing with gas exchange demonstrates normal functional capacity when compared to matched sedentary norms. Patient is primarily ventilatory limited and is clearly of obstructive nature. VE/VCO2 is elevated and indicates significant dead space ventilation most likely due to obstructive lung disease. Patient's measured VO43max is low-normal at 22.1 ml/kg/min and indicates relatively little increased risk associated with lung resection.    ASSESSMENT:    1. AAA (abdominal aortic aneurysm) without rupture ([place])   2. Essential hypertension      PLAN:  In order of problems listed above:  1.  Mitral valve prolapse severe mitral regurgitation -  echocardiography in September 2018 now shows LVEF 60%, moderate concentric LVH and now mild pulmonary hypertension.  Mitral valve is myxomatous with flail jet of mitral regurgitation.  The patient has already developed signs of CHF however improved and is adamant that he does not need surgery yet.  I will start losartan 25 mg daily prevent LVEF decrease, treat his hypertension especially in the settings of the ascending aortic aneurysm.  He is informed that if his symptoms of CHF, patients were cured he should contact us right away. We will follow in 6 months with repeat of echocardiogram prior to  that visit.  2.  Ascending aortic aneurysm -followed by Dr. Servando Snare, stable 46 mm on most recent echo in September 2018.  There is no associated aortic regurgitation.  3.  Essential hypertension -evidence of moderate concentric LVH on echocardiogram and ascending aortic aneurysm, I will start losartan 25 mg daily.  4. Lung cancer -the patient completed systemic chemo with Alimta and cisplatin , now in  remission followed by oncology.  Medication Adjustments/Labs and Tests Ordered: Current medicines are reviewed at length with the patient today.  Concerns regarding medicines are outlined above.  Medication changes, Labs and Tests ordered today are listed in the Patient Instructions below. Patient Instructions  Medication Instructions:  Your physician has recommended you make the following change in your medication:   START: Losartan 25 mg daily   Labwork: None ordered  Testing/Procedures: Your physician has requested that you have an echocardiogram in 6 months prior to your 6 month follow up appointment. Echocardiography is a painless test that uses sound waves to create images of your heart. It provides your doctor with information about the size and shape of your heart and how well your heart's chambers and valves are working. This procedure takes approximately one hour. There are no restrictions for this procedure.   Follow-Up: Your physician wants you to follow-up in: 6 months with Dr. Meda Coffee. You will receive a reminder letter in the mail two months in advance. If you don't receive a letter, please call our office to schedule the follow-up appointment.   Any Other Special Instructions Will Be Listed Below (If Applicable).     If you need a refill on your cardiac medications before your next appointment, please call your pharmacy.     Signed, William Dawley, MD  02/20/2017 9:09 AM    [facility] [address], [place], [place]  [address] Phone:  [phone]; Fax: [phone]

## 2017-02-20 NOTE — Patient Instructions (Addendum)
Medication Instructions:  Your physician has recommended you make the following change in your medication:   START: Losartan 25 mg daily   Labwork: None ordered  Testing/Procedures: Your physician has requested that you have an echocardiogram in 6 months prior to your 6 month follow up appointment. Echocardiography is a painless test that uses sound waves to create images of your heart. It provides your doctor with information about the size and shape of your heart and how well your heart's chambers and valves are working. This procedure takes approximately one hour. There are no restrictions for this procedure.   Follow-Up: Your physician wants you to follow-up in: 6 months with Dr. Meda Coffee. You will receive a reminder letter in the mail two months in advance. If you don't receive a letter, please call our office to schedule the follow-up appointment.   Any Other Special Instructions Will Be Listed Below (If Applicable).     If you need a refill on your cardiac medications before your next appointment, please call your pharmacy.

## 2017-02-25 DIAGNOSIS — S62326D Displaced fracture of shaft of fifth metacarpal bone, right hand, subsequent encounter for fracture with routine healing: Secondary | ICD-10-CM | POA: Diagnosis not present

## 2017-03-03 IMAGING — CT CT CTA ABD/PEL W/CM AND/OR W/O CM
2 of 11 series · 12 of 46 positions shown, 17 images · IV contrast (agent unspecified)
Comparison: 03/07/2016 and previous

CLINICAL DATA: S/p aortic stent placement [DATE], AAA Continued
weakness, no pain

EXAM:
CTA ABDOMEN AND PELVIS wITHOUT AND WITH CONTRAST
TECHNIQUE: Multidetector CT imaging of the abdomen and pelvis was performed
using the standard protocol during bolus administration of
intravenous contrast. Multiplanar reconstructed images and MIPs were
obtained and reviewed to evaluate the vascular anatomy.
CONTRAST:  811cc ISO 370

[Series 5: dissection 3.0 i30f 3 · axial · 0.74mm/px · z∈[+811,+1174]mm · 10 of 145 slices shown, 15 images]
[im 12/145  soft-tissue]
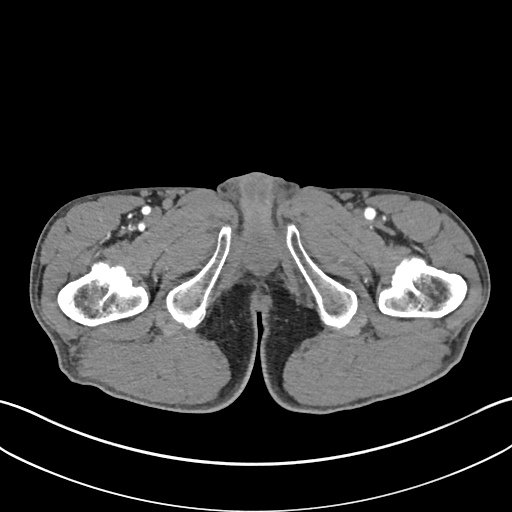
[im 12/145  bone]
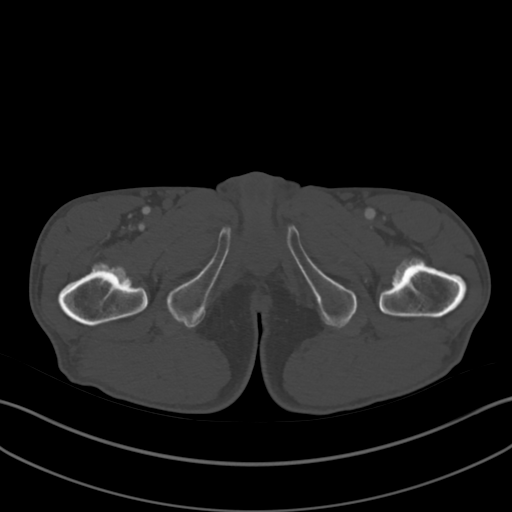
[im 34/145  soft-tissue]
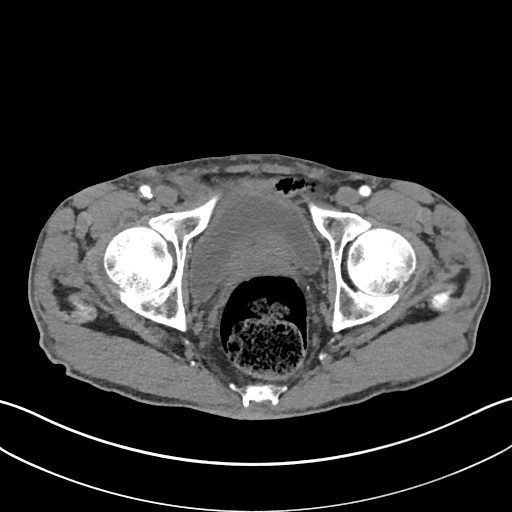
[im 45/145  soft-tissue]
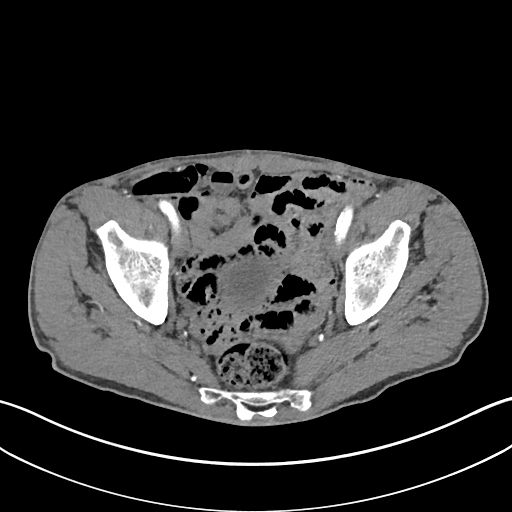
[im 56/145  soft-tissue]
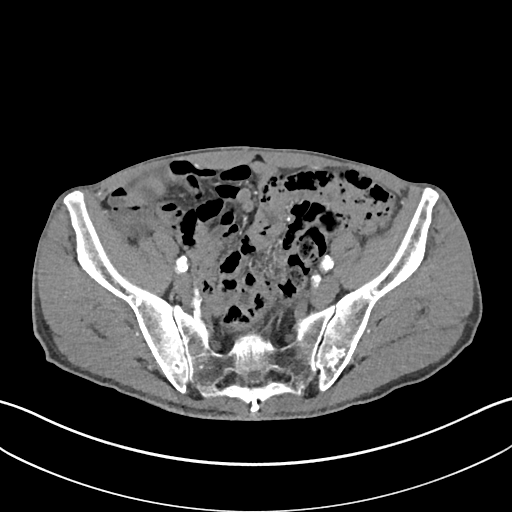
[im 78/145  soft-tissue]
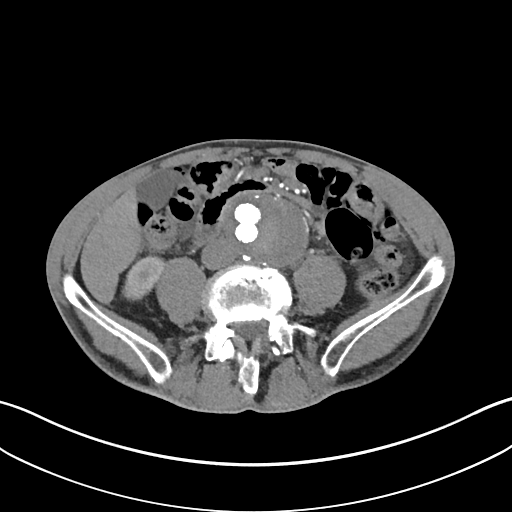
[im 89/145  soft-tissue]
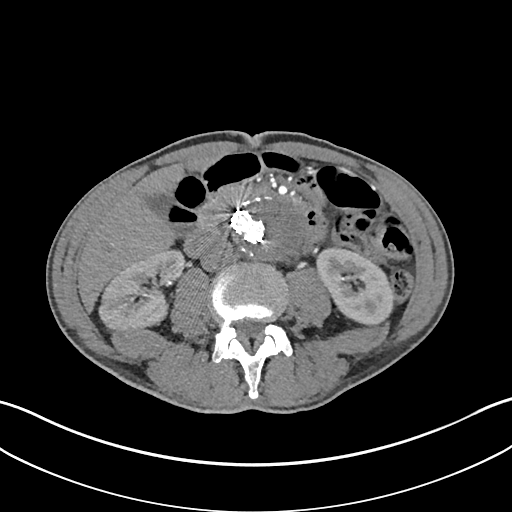
[im 100/145  soft-tissue]
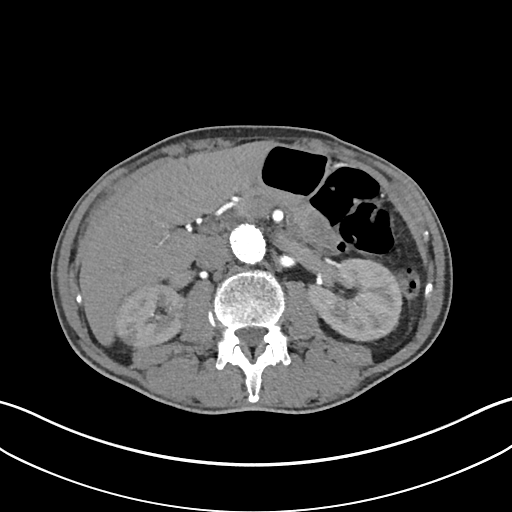
[im 100/145  lung]
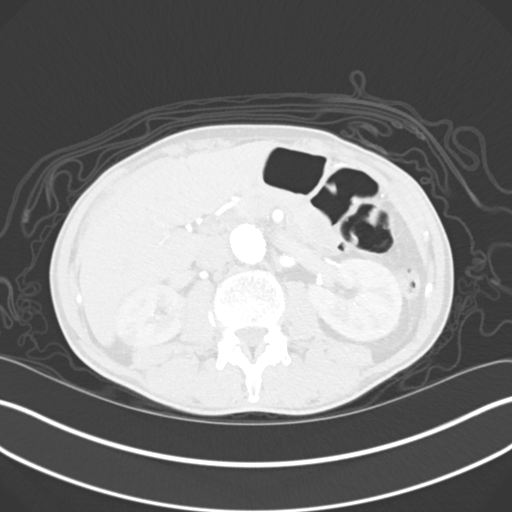
[im 111/145  lung]
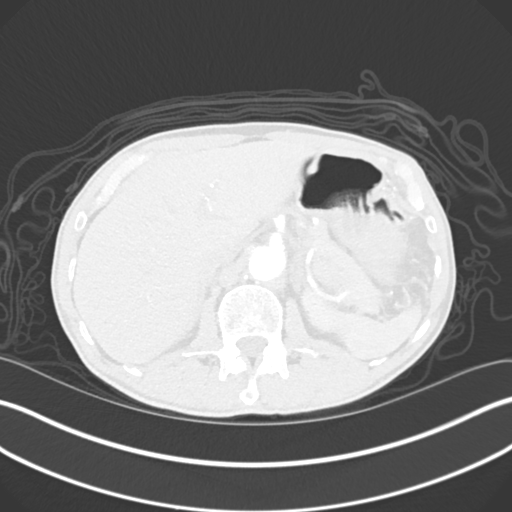
[im 122/145  soft-tissue]
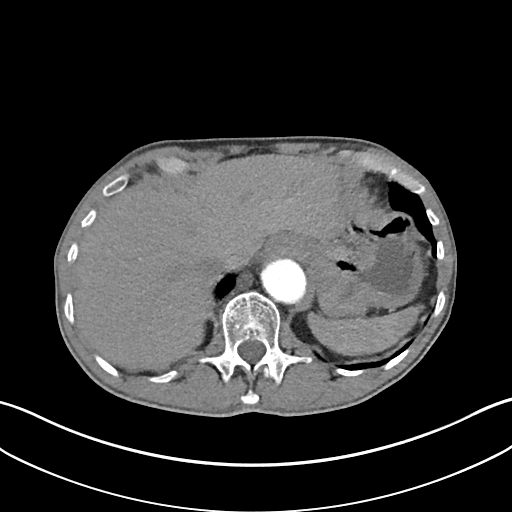
[im 122/145  lung]
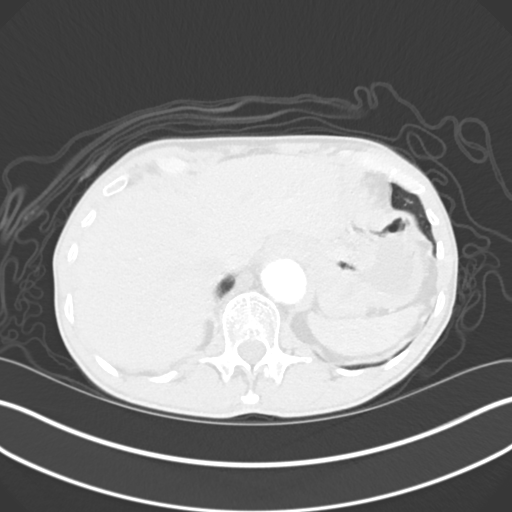
[im 133/145  soft-tissue]
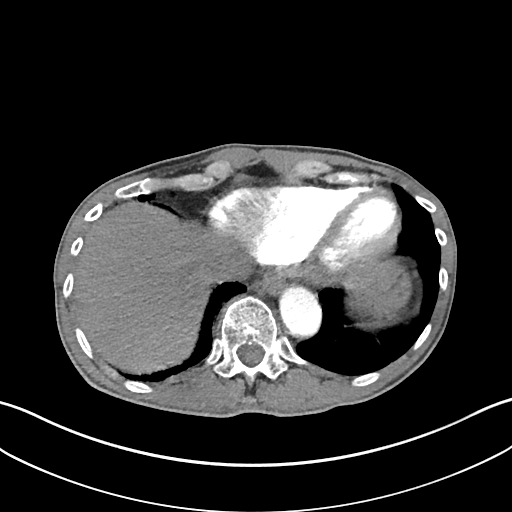
[im 133/145  lung]
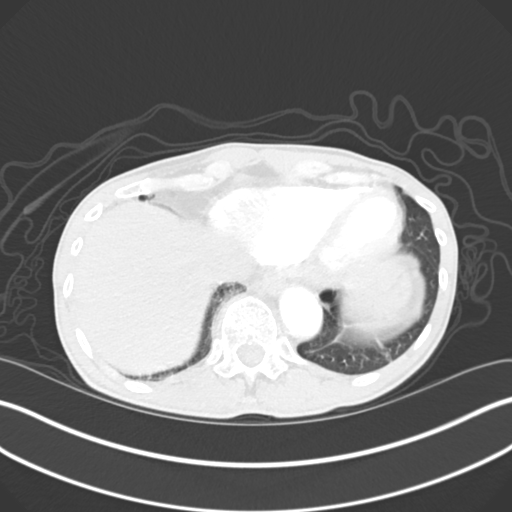
[im 133/145  bone]
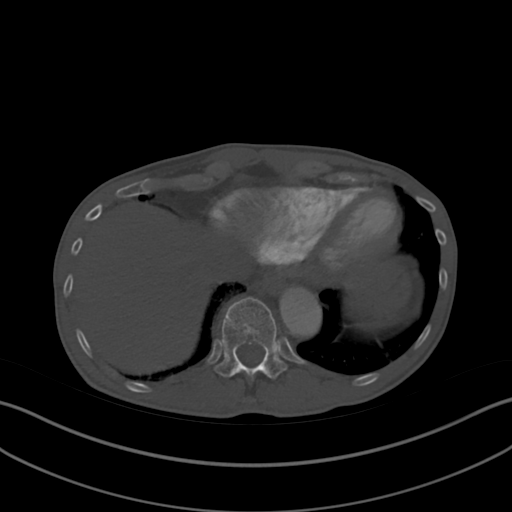

[Series 7: coronals · coronal · 0.66mm/px · 2 of 115 slices shown]
[im 39/115  soft-tissue]
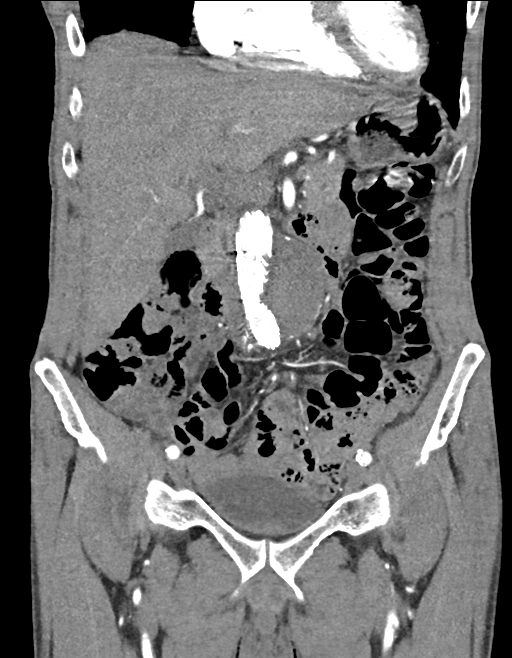
[im 77/115  soft-tissue]
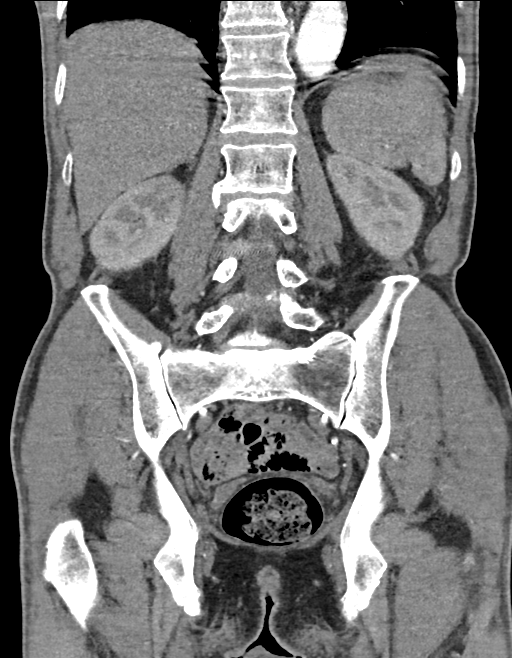

[12 of 46 positions shown; findings below may reference images not displayed]

FINDINGS: VASCULAR

Aorta: Scattered calcified plaque in the visualized distal
descending thoracic and suprarenal segments. Interval placement of
Patent infrarenal bifurcated stent graft. No endoleak. Native
aneurysm sac diameter 6.6 x 5.3 cm (previously 6.6 x 5.5 by my
measurement).

Celiac: Patent without evidence of aneurysm, dissection, vasculitis
or significant stenosis.

SMA: Patent without evidence of aneurysm, dissection, vasculitis or
significant stenosis. There is eccentric noncalcified plaque
extending over length of approximately 2.4 cm from the origin.

Renals: Both renal arteries are patent without evidence of aneurysm,
dissection, vasculitis, fibromuscular dysplasia or significant
stenosis.

IMA: Short segment origin occlusion, reconstituted distally by
visceral collaterals.

Inflow: Both iliac limbs appear well apposed and distally. Scattered
calcified plaque through the native iliac arterial systems without
aneurysm, dissection, or stenosis.

Proximal Outflow: Bilateral common femoral and visualized portions
of the superficial and profunda femoral arteries are patent without
evidence of aneurysm, dissection, vasculitis or significant
stenosis.

Veins: Patent hepatic veins, portal vein, SMV, splenic vein,
bilateral renal veins, IVC, iliac venous system. Bilateral pelvic
phleboliths.

Review of the MIP images confirms the above findings.

NON-VASCULAR

Lower chest: 3.6 cm lung mass is seen on the timing images,
incompletely visualized, without definite change from PET-CT
03/07/2016. Visualized lung bases clear. No pleural or pericardial
effusion.

Hepatobiliary: No focal liver abnormality is seen. No gallstones,
gallbladder wall thickening, or biliary dilatation.

Pancreas: Unremarkable. No pancreatic ductal dilatation or
surrounding inflammatory changes.

Spleen: Normal in size without focal abnormality.

Adrenals/Urinary Tract: Adrenal glands are unremarkable. Kidneys are
normal, without renal calculi, focal lesion, or hydronephrosis.
Bladder is unremarkable.

Stomach/Bowel: Stomach is within normal limits. Appendix appears
normal. No evidence of bowel wall thickening, distention, or
inflammatory changes.

Lymphatic: No adenopathy localized.

Reproductive: Prostatic enlargement.

Other: No ascites.  No free air.

Musculoskeletal: Degenerative disc disease L4-5. Negative for
fracture or worrisome bone lesion.
IMPRESSION: VASCULAR

1. Interval infrarenal bifurcated stent graft placement with no
endoleak, stable 6.6 cm native sac diameter.

NON-VASCULAR

1. Left lung mass as previously identified.

## 2017-03-11 DIAGNOSIS — S61216D Laceration without foreign body of right little finger without damage to nail, subsequent encounter: Secondary | ICD-10-CM | POA: Diagnosis not present

## 2017-03-11 DIAGNOSIS — S62326D Displaced fracture of shaft of fifth metacarpal bone, right hand, subsequent encounter for fracture with routine healing: Secondary | ICD-10-CM | POA: Diagnosis not present

## 2017-03-19 DIAGNOSIS — S62326D Displaced fracture of shaft of fifth metacarpal bone, right hand, subsequent encounter for fracture with routine healing: Secondary | ICD-10-CM | POA: Diagnosis not present

## 2017-03-21 DIAGNOSIS — S62326D Displaced fracture of shaft of fifth metacarpal bone, right hand, subsequent encounter for fracture with routine healing: Secondary | ICD-10-CM | POA: Diagnosis not present

## 2017-03-26 DIAGNOSIS — S62326D Displaced fracture of shaft of fifth metacarpal bone, right hand, subsequent encounter for fracture with routine healing: Secondary | ICD-10-CM | POA: Diagnosis not present

## 2017-03-28 DIAGNOSIS — S62326D Displaced fracture of shaft of fifth metacarpal bone, right hand, subsequent encounter for fracture with routine healing: Secondary | ICD-10-CM | POA: Diagnosis not present

## 2017-04-25 ENCOUNTER — Telehealth: Payer: Self-pay | Admitting: Vascular Surgery

## 2017-04-25 NOTE — Telephone Encounter (Signed)
Spoke w/ pt to give him date/time of upcoming CTA scheduled for 05/15/17 at 9:30am at the [facility] of [facility]. He is to arrive at 9am w/ no solid foods 4 hours prior. He is also scheduled to see Dr.Chen on 05/17/17 at 11:30am. I mailed the information to the patient as well awt

## 2017-05-14 NOTE — Progress Notes (Signed)
Established EVAR   History of Present Illness   [name]. is a 62 y.o. (01/07/1956) male who presents for routine follow up s/p EVAR (Date: 03/12/16).  Most recent EVAR duplex (Date: 11/09/2016) demonstrates: no endoleak and decreasing sac size.  Most recent CTA (Date: 05/17/2017) demonstrates: no endoleak and decreased sac size.  The patient has had no back or abdominal pain.  Pt has return to work after chemo, LLL lobectomy and VATS.  Pt notes some decreased work tolerance related decreased pulmonary capacity.  His lung sx are greatly improved after scaling back on his work schedule.  The patient's PMH, PSH, SH, and FamHx are unchanged from 11/09/16.  Current Outpatient Medications  Medication Sig Dispense Refill  . losartan (COZAAR) 25 MG tablet Take 1 tablet (25 mg total) by mouth daily. 90 tablet 3   No current facility-administered medications for this visit.    Facility-Administered Medications Ordered in Other Visits  Medication Dose Route Frequency Provider Last Rate Last Dose  . heparin lock flush 100 unit/mL  500 Units Intracatheter Once PRN Curt Bears, MD      . sodium chloride flush (NS) 0.9 % injection 10 mL  10 mL Intracatheter PRN Curt Bears, MD        On ROS today: improved SOB with exertion, no abdominal pain   Physical Examination   Vitals:   05/17/17 1131  BP: (!) 148/83  Pulse: 71  Resp: 16  Temp: 98.3 F (36.8 C)  TempSrc: Oral  SpO2: 100%  Weight: 157 lb 14.4 oz (71.6 kg)  Height: 6\' 3"  (1.905 m)   Body mass index is 19.74 kg/m.   General Alert, O x 3, WD, NAD  Pulmonary Sym exp, sym air movt, CTA B  Cardiac RRR, Nl S1, S2, no Murmurs, No rubs, No S3,S4  Vascular Vessel Right Left  Radial Palpable Palpable  Brachial Palpable Palpable  Carotid Palpable, No Bruit Palpable, No Bruit  Aorta Not palpable N/A  Femoral Palpable Palpable  Popliteal Not palpable Not palpable  PT Palpable Palpable  DP Palpable Palpable      Gastro- intestinal soft, non-distended, non-tender to palpation, No guarding or rebound, no HSM, no masses, no CVAT B, No palpable prominent aortic pulse,    Musculo- skeletal M/S 5/5 throughout  , Extremities without ischemic changes, no obvious pop aneurysms  Neurologic Pain and light touch intact in extremities , Motor exam as listed above    Radiology   CTA abd/pelvis (05/15/17): VASCULAR Aorto bi-iliac stent graft for abdominal aortic aneurysm repair remains patent. There is no evidence of endoleak. Maximal aortic sac diameter has markedly reduced in size from 6.6 cm to 4.9 cm.  NON-VASCULAR No acute disease.  Based on my review of this patient's CTA, endograft is in good position with intact renal and internal iliac artery flow.  There is no evidence of limb dysfunction or endoleak.  Sac is smaller on today's CT.   Medical Decision Making   William ADKINS [name] is a 62 y.o. (August 15, 1955) male who presents s/p EVAR.  Pt is asymptomatic from AAA with decreased sac size, s/p surgical and chemo txt of L lung cancer   AAA sac is shrinking appropriately without any evidence of endoleak.  I discussed with the patient the importance of surveillance of the endograft.  The next endograft duplex will be scheduled for 12 months.  The patient will follow up with [place] in 12 months with these studies.  As long as  no endoleaks and continued decreased AAA sac size, probably can avoid additional CTA in order to avoid additional radiation in this cancer pt.  Thank you for allowing [place] to participate in this patient's care.   Adele Barthel, MD, FACS [facility] Office: [phone] Pager: [phone]

## 2017-05-15 ENCOUNTER — Ambulatory Visit
Admission: RE | Admit: 2017-05-15 | Discharge: 2017-05-15 | Disposition: A | Discharge status: Home / Self Care | Payer: No Typology Code available for payment source | Source: Ambulatory Visit | Attending: Vascular Surgery | Admitting: Vascular Surgery

## 2017-05-15 DIAGNOSIS — I714 Abdominal aortic aneurysm, without rupture, unspecified: Secondary | ICD-10-CM

## 2017-05-15 MED ORDER — IOPAMIDOL (ISOVUE-370) INJECTION 76%
75.0000 mL | Freq: Once | INTRAVENOUS | Status: AC | PRN
  Administered 2017-05-15: 75 mL via INTRAVENOUS

## 2017-05-17 ENCOUNTER — Encounter: Payer: Self-pay | Admitting: Vascular Surgery

## 2017-05-17 ENCOUNTER — Ambulatory Visit (INDEPENDENT_AMBULATORY_CARE_PROVIDER_SITE_OTHER): Payer: No Typology Code available for payment source | Admitting: Vascular Surgery

## 2017-05-17 VITALS — BP 148/83 | HR 71 | Temp 98.3°F | Resp 16 | Ht 75.0 in | Wt 157.9 lb

## 2017-05-17 DIAGNOSIS — I714 Abdominal aortic aneurysm, without rupture, unspecified: Secondary | ICD-10-CM

## 2017-07-08 ENCOUNTER — Telehealth: Payer: Self-pay

## 2017-07-08 ENCOUNTER — Telehealth: Payer: Self-pay | Admitting: Internal Medicine

## 2017-07-08 NOTE — Telephone Encounter (Signed)
Patient wanted to verify appt. In March. Per 3/18 phone message return.

## 2017-07-08 NOTE — Telephone Encounter (Signed)
tried calling regarding voicemail

## 2017-07-15 ENCOUNTER — Ambulatory Visit (HOSPITAL_COMMUNITY): Payer: No Typology Code available for payment source

## 2017-07-15 ENCOUNTER — Other Ambulatory Visit: Payer: No Typology Code available for payment source

## 2017-07-16 ENCOUNTER — Inpatient Hospital Stay: Payer: BLUE CROSS/BLUE SHIELD | Attending: Internal Medicine

## 2017-07-16 ENCOUNTER — Encounter (HOSPITAL_COMMUNITY): Payer: Self-pay

## 2017-07-16 ENCOUNTER — Ambulatory Visit (HOSPITAL_COMMUNITY)
Admission: RE | Admit: 2017-07-16 | Discharge: 2017-07-16 | Disposition: A | Discharge status: Home / Self Care | Payer: BLUE CROSS/BLUE SHIELD | Source: Ambulatory Visit | Attending: Oncology | Admitting: Oncology

## 2017-07-16 DIAGNOSIS — I700 Atherosclerosis of aorta: Secondary | ICD-10-CM | POA: Insufficient documentation

## 2017-07-16 DIAGNOSIS — C3432 Malignant neoplasm of lower lobe, left bronchus or lung: Secondary | ICD-10-CM | POA: Diagnosis not present

## 2017-07-16 DIAGNOSIS — Z902 Acquired absence of lung [part of]: Secondary | ICD-10-CM | POA: Insufficient documentation

## 2017-07-16 DIAGNOSIS — E041 Nontoxic single thyroid nodule: Secondary | ICD-10-CM | POA: Diagnosis not present

## 2017-07-16 DIAGNOSIS — I10 Essential (primary) hypertension: Secondary | ICD-10-CM | POA: Insufficient documentation

## 2017-07-16 DIAGNOSIS — R911 Solitary pulmonary nodule: Secondary | ICD-10-CM | POA: Insufficient documentation

## 2017-07-16 DIAGNOSIS — Z85118 Personal history of other malignant neoplasm of bronchus and lung: Secondary | ICD-10-CM | POA: Insufficient documentation

## 2017-07-16 DIAGNOSIS — J439 Emphysema, unspecified: Secondary | ICD-10-CM | POA: Diagnosis not present

## 2017-07-16 DIAGNOSIS — R918 Other nonspecific abnormal finding of lung field: Secondary | ICD-10-CM | POA: Diagnosis not present

## 2017-07-16 DIAGNOSIS — N289 Disorder of kidney and ureter, unspecified: Secondary | ICD-10-CM | POA: Diagnosis not present

## 2017-07-16 LAB — COMPREHENSIVE METABOLIC PANEL
ALK PHOS: 75 U/L (ref 40–150)
ALT: 31 U/L (ref 0–55)
AST: 36 U/L — ABNORMAL HIGH (ref 5–34)
Albumin: 4.2 g/dL (ref 3.5–5.0)
Anion gap: 8 (ref 3–11)
BILIRUBIN TOTAL: 0.6 mg/dL (ref 0.2–1.2)
BUN: 18 mg/dL (ref 7–26)
CALCIUM: 9.5 mg/dL (ref 8.4–10.4)
CHLORIDE: 108 mmol/L (ref 98–109)
CO2: 22 mmol/L (ref 22–29)
CREATININE: 1.52 mg/dL — AB (ref 0.70–1.30)
GFR calc Af Amer: 55 mL/min — ABNORMAL LOW (ref >60)
GFR, EST NON AFRICAN AMERICAN: 47 mL/min — AB (ref >60)
Glucose, Bld: 104 mg/dL (ref 70–140)
Potassium: 5 mmol/L (ref 3.5–5.1)
Sodium: 138 mmol/L (ref 136–145)
TOTAL PROTEIN: 7.5 g/dL (ref 6.4–8.3)

## 2017-07-16 LAB — CBC WITH DIFFERENTIAL/PLATELET
BASOS ABS: 0.1 10*3/uL (ref 0.0–0.1)
BASOS PCT: 1 %
EOS ABS: 0.3 10*3/uL (ref 0.0–0.5)
EOS PCT: 3 %
HCT: 38.8 % (ref 38.4–49.9)
HEMOGLOBIN: 12.8 g/dL — AB (ref 13.0–17.1)
Lymphocytes Relative: 21 %
Lymphs Abs: 1.8 10*3/uL (ref 0.9–3.3)
MCH: 30.7 pg (ref 27.2–33.4)
MCHC: 33 g/dL (ref 32.0–36.0)
MCV: 92.8 fL (ref 79.3–98.0)
Monocytes Absolute: 0.8 10*3/uL (ref 0.1–0.9)
Monocytes Relative: 9 %
NEUTROS PCT: 66 %
Neutro Abs: 5.6 10*3/uL (ref 1.5–6.5)
PLATELETS: 229 10*3/uL (ref 140–400)
RBC: 4.18 MIL/uL — AB (ref 4.20–5.82)
RDW: 13.4 % (ref 11.0–14.6)
WBC: 8.6 10*3/uL (ref 4.0–10.3)

## 2017-07-16 MED ORDER — IOPAMIDOL (ISOVUE-300) INJECTION 61%
INTRAVENOUS | Status: AC
  Administered 2017-07-16: 75 mL
  Filled 2017-07-16: qty 75

## 2017-07-17 ENCOUNTER — Encounter: Payer: Self-pay | Admitting: Internal Medicine

## 2017-07-17 ENCOUNTER — Inpatient Hospital Stay (HOSPITAL_BASED_OUTPATIENT_CLINIC_OR_DEPARTMENT_OTHER): Payer: BLUE CROSS/BLUE SHIELD | Admitting: Internal Medicine

## 2017-07-17 ENCOUNTER — Telehealth: Payer: Self-pay

## 2017-07-17 VITALS — BP 141/70 | HR 66 | Temp 97.8°F | Resp 18 | Ht 75.0 in | Wt 158.5 lb

## 2017-07-17 DIAGNOSIS — Z85118 Personal history of other malignant neoplasm of bronchus and lung: Secondary | ICD-10-CM

## 2017-07-17 DIAGNOSIS — R911 Solitary pulmonary nodule: Secondary | ICD-10-CM

## 2017-07-17 DIAGNOSIS — N289 Disorder of kidney and ureter, unspecified: Secondary | ICD-10-CM

## 2017-07-17 DIAGNOSIS — C3490 Malignant neoplasm of unspecified part of unspecified bronchus or lung: Secondary | ICD-10-CM

## 2017-07-17 DIAGNOSIS — I10 Essential (primary) hypertension: Secondary | ICD-10-CM

## 2017-07-17 NOTE — Telephone Encounter (Signed)
Printed avs and calender of upcoming appointment. Per 3/27 los

## 2017-07-17 NOTE — Progress Notes (Signed)
[place] [phone]   [phone]) [phone]  OFFICE PROGRESS NOTE  Patient, No Pcp Per No address on file  DIAGNOSIS: stage IB (T2a, N0, M0) non-small cell lung cancer, adenocarcinoma diagnosed in December 2017.  PRIOR THERAPY:  1) status post left lower lobectomy as well as wedge resection of the left upper lobe on 05/11/2016. 2) Adjuvant systemic chemotherapy with cisplatin 75 MG/M2 and Alimta 500 MG/M2 every 3 weeks. First dose 07/03/2016. Status post 4 cycles.  CURRENT THERAPY: Observation.  INTERVAL HISTORY: William Kales [name] 62 y.o. male returns to the clinic today for follow-up visit accompanied by his wife.  The patient is feeling fine today with no specific complaints.  He works 2 jobs at this time.  He denied having any chest pain, shortness of breath, cough or hemoptysis.  He denied having any fever or chills.  He has no nausea, vomiting, diarrhea or constipation.  He has no significant weight loss or night sweats.  He had repeat CT scan of the chest performed recently and he is here for evaluation and discussion of his discuss results.  MEDICAL HISTORY: Past Medical History:  Diagnosis Date  . AAA (abdominal aortic aneurysm) ([place])   . Cancer ([place])    lung  . Encounter for antineoplastic chemotherapy 06/06/2016  . History of hepatitis B   . HNP (herniated nucleus pulposus), lumbar    L4 with radiculopathy  . Hypertension   . Mass of left lung   . Mitral insufficiency 04/06/2016   This patient will eventually need MV repair if his prognosis is good from oncology standpoint. However, his lung cancer therapy  is the priority right now. His cardiac condition won't preclude possible lung surgery.  Once his lung cancer is under control he will need a TEE to further evaluate his mitral valve anatomy and MR severity, and also have ischemic workup as tere is evidence on calcificati  . Renal insufficiency 10/09/2016  . Varicose veins of legs      ALLERGIES:  is allergic to tramadol.  MEDICATIONS:  Current Outpatient Medications  Medication Sig Dispense Refill  . losartan (COZAAR) 25 MG tablet Take 1 tablet (25 mg total) by mouth daily. 90 tablet 3   No current facility-administered medications for this visit.    Facility-Administered Medications Ordered in Other Visits  Medication Dose Route Frequency Provider Last Rate Last Dose  . heparin lock flush 100 unit/mL  500 Units Intracatheter Once PRN Curt Bears, MD      . sodium chloride flush (NS) 0.9 % injection 10 mL  10 mL Intracatheter PRN Curt Bears, MD        SURGICAL HISTORY:  Past Surgical History:  Procedure Laterality Date  . ABDOMINAL AORTIC ENDOVASCULAR STENT GRAFT N/A 03/12/2016   Procedure: ABDOMINAL AORTIC ENDOVASCULAR STENT GRAFT;  Surgeon: Conrad Sumpter, MD;  Location: [facility];  Service: Vascular;  Laterality: N/A;  . COLONOSCOPY    . INGUINAL HERNIA REPAIR Left 1975   Left inguinal hernia  . INGUINAL HERNIA REPAIR Right   . LOBECTOMY Left 05/11/2016   Procedure: LEFT LOWER LOBE LOBECTOMY AND LEFT UPPER LOBE  RESECTION AND PLACEMENT OF ON-Q;  Surgeon: [name], MD;  Location: [facility];  Service: Thoracic;  Laterality: Left;  . LUMBAR LAMINECTOMY  October 22, 2012  . LYMPH NODE DISSECTION Left 05/11/2016   Procedure: LYMPH NODE DISSECTION;  Surgeon: [name], MD;  Location: [facility];  Service: Thoracic;  Laterality:  Left;  . STAPLING OF BLEBS Left 05/11/2016   Procedure: STAPLING OF APICAL BLEB;  Surgeon: [name], MD;  Location: [facility];  Service: Thoracic;  Laterality: Left;  Marland Kitchen VIDEO ASSISTED THORACOSCOPY Left 05/18/2016   Procedure: VIDEO ASSISTED THORACOSCOPY WITH REMOVAL OF LEFT APICAL BLEB;  Surgeon: [name], MD;  Location: [place];  Service: Thoracic;  Laterality: Left;  Marland Kitchen VIDEO ASSISTED THORACOSCOPY (VATS)/WEDGE RESECTION Left 05/11/2016   Procedure: LEFT VIDEO ASSISTED THORACOSCOPY (VATS);  Surgeon: [name],  MD;  Location: [place];  Service: Thoracic;  Laterality: Left;  Marland Kitchen VIDEO BRONCHOSCOPY N/A 05/11/2016   Procedure: VIDEO BRONCHOSCOPY, LEFT LUNG;  Surgeon: [name], MD;  Location: [facility];  Service: Thoracic;  Laterality: N/A;  . VIDEO BRONCHOSCOPY N/A 05/18/2016   Procedure: VIDEO BRONCHOSCOPY WITH BRONCHIAL WASHING;  Surgeon: [name], MD;  Location: [place];  Service: Thoracic;  Laterality: N/A;    REVIEW OF SYSTEMS:  A comprehensive review of systems was negative.   PHYSICAL EXAMINATION: General appearance: alert, cooperative and no distress Head: Normocephalic, without obvious abnormality, atraumatic Neck: no adenopathy, no JVD, supple, symmetrical, trachea midline and thyroid not enlarged, symmetric, no tenderness/mass/nodules Lymph nodes: Cervical, supraclavicular, and axillary nodes normal. Resp: clear to auscultation bilaterally Back: symmetric, no curvature. ROM normal. No CVA tenderness. Cardio: regular rate and rhythm, S1, S2 normal, no murmur, click, rub or gallop GI: soft, non-tender; bowel sounds normal; no masses,  no organomegaly Extremities: extremities normal, atraumatic, no cyanosis or edema  ECOG PERFORMANCE STATUS: 1 - Symptomatic but completely ambulatory  Blood pressure (!) 141/70, pulse 66, temperature 97.8 F (36.6 C), temperature source Oral, resp. rate 18, height 6\' 3"  (1.905 m), weight 158 lb 8 oz (71.9 kg), SpO2 98 %.  LABORATORY DATA: Lab Results  Component Value Date   WBC 8.6 07/16/2017   HGB 12.8 (L) 07/16/2017   HCT 38.8 07/16/2017   MCV 92.8 07/16/2017   PLT 229 07/16/2017      Chemistry      Component Value Date/Time   NA 138 07/16/2017 0936   NA 138 01/11/2017 1343   K 5.0 07/16/2017 0936   K 4.7 01/11/2017 1343   CL 108 07/16/2017 0936   CO2 22 07/16/2017 0936   CO2 23 01/11/2017 1343   BUN 18 07/16/2017 0936   BUN 20.2 01/11/2017 1343   CREATININE 1.52 (H) 07/16/2017 0936   CREATININE 1.6 (H) 01/11/2017 1343       Component Value Date/Time   CALCIUM 9.5 07/16/2017 0936   CALCIUM 9.3 01/11/2017 1343   ALKPHOS 75 07/16/2017 0936   ALKPHOS 89 01/11/2017 1343   AST 36 (H) 07/16/2017 0936   AST 41 (H) 01/11/2017 1343   ALT 31 07/16/2017 0936   ALT 27 01/11/2017 1343   BILITOT 0.6 07/16/2017 0936   BILITOT 0.49 01/11/2017 1343       RADIOGRAPHIC STUDIES: Ct Chest W Contrast  Result Date: 07/16/2017 CLINICAL DATA:  Left-sided lung mass. Vats with left upper lobe wedge resection 05/11/2016. Left lower lobectomy 05/11/2016. EXAM: CT CHEST WITH CONTRAST TECHNIQUE: Multidetector CT imaging of the chest was performed during intravenous contrast administration. CONTRAST:  75mL ISOVUE-300 IOPAMIDOL (ISOVUE-300) INJECTION 61% COMPARISON:  01/11/2017 FINDINGS: Cardiovascular: Aortic and branch vessel atherosclerosis. Ascending aorta upper normal in size to mildly dilated at 4.0 cm on image 103/2. Similar. The proximal descending aorta is also upper normal to mildly dilated at 4.0 cm on image 66/2. Tortuous thoracic aorta. Cardiomegaly, accentuated  by a pectus excavatum deformity. No pericardial effusion. No central pulmonary embolism, on this non-dedicated study. Mediastinum/Nodes: left thyroid nodule is felt to be similar at 12 mm on image 19/2. No supraclavicular adenopathy. No mediastinal adenopathy. Upper normal left infrahilar nodal tissue at 7 mm, not well evaluated on prior noncontrast exam. This may have enlarged since 10/05/2016. Subtle fluid level in the esophagus including on image 74/2. Anterior mediastinal soft tissue thickening measures 1.1 x 2.0 cm on image 96/2. This is similar compared to 10/05/2016. Lungs/Pleura: No pleural fluid. Right-sided pleural thickening and calcification are similar. Minimal motion degradation in the mid and lower chest. Moderate bullous type emphysema. 3 mm right middle lobe pulmonary nodule on image 93/5, similar. 5 mm right lower lobe pulmonary nodule is unchanged on image  156/5. left lower lobectomy. Left upper lobe wedge resection posteriorly. Within the posterior left apex, there are 2 adjacent nodules. The more medial and superior measures 7 mm on image 44/5, new. The more inferior and lateral measures 6 mm on image 49/5 and is also new. Upper Abdomen: Normal imaged portions of the liver, spleen, pancreas, adrenal glands. Proximal gastric underdistention. Too small to characterize upper pole left renal lesion. Mild scarring involves the upper pole right kidney. Musculoskeletal: No acute osseous abnormality. IMPRESSION: 1. Status post left lower lobectomy and left upper lobe wedge resection. 2. Two new nodules within the posterior left apex. Suspicious for metastatic disease or metachronous primaries. Given distribution, infection is possible but felt less likely. Likely too small for tissue sampling. Consider CT follow-up at 3 months and possibly antibiotic therapy in the interval. Other pulmonary nodules are unchanged. 3. No thoracic adenopathy. A borderline enlarged left infrahilar node warrants follow-up attention. 4. Aortic atherosclerosis (ICD10-I70.0) and emphysema (ICD10-J43.9). 5. Right pleural calcifications, suggesting empyema or hemothorax. 6. Esophageal air fluid level suggests dysmotility or gastroesophageal reflux. 7. Similar anterior mediastinal soft tissue fullness, possibly representing residual thymic tissue or thymic hyperplasia/neoplasm. 8. Similar nonspecific left-sided thyroid nodule. Electronically Signed   By: Abigail Miyamoto M.D.   On: 07/16/2017 14:28    ASSESSMENT AND PLAN:  This is a very pleasant 62 years old white male with a stage IB non-small cell lung cancer, adenocarcinoma status post left lower lobectomy as well as wedge resection of the left upper lobe. The patient is currently undergoing adjuvant systemic chemotherapy with cisplatin and Alimta status post 4 cycles. The patient has been in observation for several months. Repeat CT scan of the  chest showed suspicious small nodules in the left lung. I personally and independently reviewed the scan images and discussed the results and showed the images to the patient and his wife. I recommended for him to continue in observation with repeat CT scan of the chest in 3 months for reevaluation of this small nodules.  The nodule is currently under the detection level of PET scan and it will not be an easy biopsy with the small size right now. The patient and his wife agreed to the current plan. He was advised to call immediately if he has any concerning symptoms in the interval. The patient voices understanding of current disease status and treatment options and is in agreement with the current care plan. All questions were answered. The patient knows to call the clinic with any problems, questions or concerns. We can certainly see the patient much sooner if necessary. I spent 10 minutes counseling the patient face to face. The total time spent in the appointment was 15 minutes.  Disclaimer: This note was dictated with voice recognition software. Similar sounding words can inadvertently be transcribed and may not be corrected upon review.            [place] Telephone:(336) [phone]   Fax:(336) [phone]  OFFICE PROGRESS NOTE  Patient, No Pcp Per No address on file  DIAGNOSIS: stage IB (T2a, N0, M0) non-small cell lung cancer, adenocarcinoma diagnosed in December 2017.  PRIOR THERAPY:  1) status post left lower lobectomy as well as wedge resection of the left upper lobe on 05/11/2016. 2) Adjuvant systemic chemotherapy with cisplatin 75 MG/M2 and Alimta 500 MG/M2 every 3 weeks. First dose 07/03/2016. Status post 4 cycles.  CURRENT THERAPY: Observation.  INTERVAL HISTORY: William Kales [name] 62 y.o. male returns to the clinic today for follow-up visit accompanied by his wife. The patient is feeling fine today with no specific complaints. He gained few pounds since his  last visit. He denied having any chest pain, shortness of breath, cough or hemoptysis. He has no fever or chills. He denied having any nausea, vomiting, diarrhea or constipation. He tolerated his course of adjuvant systemic chemotherapy fairly well. He had repeat CT scan of the chest performed recently and he is here for evaluation and discussion of his scan results.   MEDICAL HISTORY: Past Medical History:  Diagnosis Date  . AAA (abdominal aortic aneurysm) ([place])   . Cancer ([place])    lung  . Encounter for antineoplastic chemotherapy 06/06/2016  . History of hepatitis B   . HNP (herniated nucleus pulposus), lumbar    L4 with radiculopathy  . Hypertension   . Mass of left lung   . Mitral insufficiency 04/06/2016   This patient will eventually need MV repair if his prognosis is good from oncology standpoint. However, his lung cancer therapy  is the priority right now. His cardiac condition won't preclude possible lung surgery.  Once his lung cancer is under control he will need a TEE to further evaluate his mitral valve anatomy and MR severity, and also have ischemic workup as tere is evidence on calcificati  . Renal insufficiency 10/09/2016  . Varicose veins of legs     ALLERGIES:  is allergic to tramadol.  MEDICATIONS:  Current Outpatient Medications  Medication Sig Dispense Refill  . losartan (COZAAR) 25 MG tablet Take 1 tablet (25 mg total) by mouth daily. 90 tablet 3   No current facility-administered medications for this visit.    Facility-Administered Medications Ordered in Other Visits  Medication Dose Route Frequency Provider Last Rate Last Dose  . heparin lock flush 100 unit/mL  500 Units Intracatheter Once PRN Curt Bears, MD      . sodium chloride flush (NS) 0.9 % injection 10 mL  10 mL Intracatheter PRN Curt Bears, MD        SURGICAL HISTORY:  Past Surgical History:  Procedure Laterality Date  . ABDOMINAL AORTIC ENDOVASCULAR STENT GRAFT N/A 03/12/2016    Procedure: ABDOMINAL AORTIC ENDOVASCULAR STENT GRAFT;  Surgeon: Conrad Sadorus, MD;  Location: [facility];  Service: Vascular;  Laterality: N/A;  . COLONOSCOPY    . INGUINAL HERNIA REPAIR Left 1975   Left inguinal hernia  . INGUINAL HERNIA REPAIR Right   . LOBECTOMY Left 05/11/2016   Procedure: LEFT LOWER LOBE LOBECTOMY AND LEFT UPPER LOBE  RESECTION AND PLACEMENT OF ON-Q;  Surgeon: [name], MD;  Location: [facility];  Service: Thoracic;  Laterality: Left;  . LUMBAR LAMINECTOMY  October 22, 2012  .  LYMPH NODE DISSECTION Left 05/11/2016   Procedure: LYMPH NODE DISSECTION;  Surgeon: [name], MD;  Location: [facility];  Service: Thoracic;  Laterality: Left;  . STAPLING OF BLEBS Left 05/11/2016   Procedure: STAPLING OF APICAL BLEB;  Surgeon: [name], MD;  Location: [facility];  Service: Thoracic;  Laterality: Left;  Marland Kitchen VIDEO ASSISTED THORACOSCOPY Left 05/18/2016   Procedure: VIDEO ASSISTED THORACOSCOPY WITH REMOVAL OF LEFT APICAL BLEB;  Surgeon: [name], MD;  Location: [facility];  Service: Thoracic;  Laterality: Left;  Marland Kitchen VIDEO ASSISTED THORACOSCOPY (VATS)/WEDGE RESECTION Left 05/11/2016   Procedure: LEFT VIDEO ASSISTED THORACOSCOPY (VATS);  Surgeon: [name], MD;  Location: [facility];  Service: Thoracic;  Laterality: Left;  Marland Kitchen VIDEO BRONCHOSCOPY N/A 05/11/2016   Procedure: VIDEO BRONCHOSCOPY, LEFT LUNG;  Surgeon: [name], MD;  Location: [facility];  Service: Thoracic;  Laterality: N/A;  . VIDEO BRONCHOSCOPY N/A 05/18/2016   Procedure: VIDEO BRONCHOSCOPY WITH BRONCHIAL WASHING;  Surgeon: [name], MD;  Location: [facility] [place];  Service: Thoracic;  Laterality: N/A;    REVIEW OF SYSTEMS:  Constitutional: negative Eyes: negative Ears, nose, mouth, throat, and face: negative Respiratory: negative Cardiovascular: negative Gastrointestinal: negative Genitourinary:negative Integument/breast: negative Hematologic/lymphatic: negative Musculoskeletal:negative Neurological:  negative Behavioral/Psych: negative Endocrine: negative Allergic/Immunologic: negative   PHYSICAL EXAMINATION: General appearance: alert, cooperative and no distress Head: Normocephalic, without obvious abnormality, atraumatic Neck: no adenopathy, no JVD, supple, symmetrical, trachea midline and thyroid not enlarged, symmetric, no tenderness/mass/nodules Lymph nodes: Cervical, supraclavicular, and axillary nodes normal. Resp: clear to auscultation bilaterally Back: symmetric, no curvature. ROM normal. No CVA tenderness. Cardio: regular rate and rhythm, S1, S2 normal, no murmur, click, rub or gallop GI: soft, non-tender; bowel sounds normal; no masses,  no organomegaly Extremities: extremities normal, atraumatic, no cyanosis or edema Neurologic: Alert and oriented X 3, normal strength and tone. Normal symmetric reflexes. Normal coordination and gait  ECOG PERFORMANCE STATUS: 1 - Symptomatic but completely ambulatory  Blood pressure (!) 141/70, pulse 66, temperature 97.8 F (36.6 C), temperature source Oral, resp. rate 18, height 6\' 3"  (1.905 m), weight 158 lb 8 oz (71.9 kg), SpO2 98 %.  LABORATORY DATA: Lab Results  Component Value Date   WBC 8.6 07/16/2017   HGB 12.8 (L) 07/16/2017   HCT 38.8 07/16/2017   MCV 92.8 07/16/2017   PLT 229 07/16/2017      Chemistry      Component Value Date/Time   NA 138 07/16/2017 0936   NA 138 01/11/2017 1343   K 5.0 07/16/2017 0936   K 4.7 01/11/2017 1343   CL 108 07/16/2017 0936   CO2 22 07/16/2017 0936   CO2 23 01/11/2017 1343   BUN 18 07/16/2017 0936   BUN 20.2 01/11/2017 1343   CREATININE 1.52 (H) 07/16/2017 0936   CREATININE 1.6 (H) 01/11/2017 1343      Component Value Date/Time   CALCIUM 9.5 07/16/2017 0936   CALCIUM 9.3 01/11/2017 1343   ALKPHOS 75 07/16/2017 0936   ALKPHOS 89 01/11/2017 1343   AST 36 (H) 07/16/2017 0936   AST 41 (H) 01/11/2017 1343   ALT 31 07/16/2017 0936   ALT 27 01/11/2017 1343   BILITOT 0.6 07/16/2017  0936   BILITOT 0.49 01/11/2017 1343       RADIOGRAPHIC STUDIES: Ct Chest W Contrast  Result Date: 07/16/2017 CLINICAL DATA:  Left-sided lung mass. Vats with left upper lobe wedge resection 05/11/2016. Left lower lobectomy 05/11/2016. EXAM: CT CHEST WITH CONTRAST TECHNIQUE: Multidetector  CT imaging of the chest was performed during intravenous contrast administration. CONTRAST:  65mL ISOVUE-300 IOPAMIDOL (ISOVUE-300) INJECTION 61% COMPARISON:  01/11/2017 FINDINGS: Cardiovascular: Aortic and branch vessel atherosclerosis. Ascending aorta upper normal in size to mildly dilated at 4.0 cm on image 103/2. Similar. The proximal descending aorta is also upper normal to mildly dilated at 4.0 cm on image 66/2. Tortuous thoracic aorta. Cardiomegaly, accentuated by a pectus excavatum deformity. No pericardial effusion. No central pulmonary embolism, on this non-dedicated study. Mediastinum/Nodes: left thyroid nodule is felt to be similar at 12 mm on image 19/2. No supraclavicular adenopathy. No mediastinal adenopathy. Upper normal left infrahilar nodal tissue at 7 mm, not well evaluated on prior noncontrast exam. This may have enlarged since 10/05/2016. Subtle fluid level in the esophagus including on image 74/2. Anterior mediastinal soft tissue thickening measures 1.1 x 2.0 cm on image 96/2. This is similar compared to 10/05/2016. Lungs/Pleura: No pleural fluid. Right-sided pleural thickening and calcification are similar. Minimal motion degradation in the mid and lower chest. Moderate bullous type emphysema. 3 mm right middle lobe pulmonary nodule on image 93/5, similar. 5 mm right lower lobe pulmonary nodule is unchanged on image 156/5. left lower lobectomy. Left upper lobe wedge resection posteriorly. Within the posterior left apex, there are 2 adjacent nodules. The more medial and superior measures 7 mm on image 44/5, new. The more inferior and lateral measures 6 mm on image 49/5 and is also new. Upper Abdomen:  Normal imaged portions of the liver, spleen, pancreas, adrenal glands. Proximal gastric underdistention. Too small to characterize upper pole left renal lesion. Mild scarring involves the upper pole right kidney. Musculoskeletal: No acute osseous abnormality. IMPRESSION: 1. Status post left lower lobectomy and left upper lobe wedge resection. 2. Two new nodules within the posterior left apex. Suspicious for metastatic disease or metachronous primaries. Given distribution, infection is possible but felt less likely. Likely too small for tissue sampling. Consider CT follow-up at 3 months and possibly antibiotic therapy in the interval. Other pulmonary nodules are unchanged. 3. No thoracic adenopathy. A borderline enlarged left infrahilar node warrants follow-up attention. 4. Aortic atherosclerosis (ICD10-I70.0) and emphysema (ICD10-J43.9). 5. Right pleural calcifications, suggesting empyema or hemothorax. 6. Esophageal air fluid level suggests dysmotility or gastroesophageal reflux. 7. Similar anterior mediastinal soft tissue fullness, possibly representing residual thymic tissue or thymic hyperplasia/neoplasm. 8. Similar nonspecific left-sided thyroid nodule. Electronically Signed   By: Abigail Miyamoto M.D.   On: 07/16/2017 14:28    ASSESSMENT AND PLAN:  This is a very pleasant 62 years old white male with a stage IB non-small cell lung cancer, adenocarcinoma status post wedge resection of the left upper lobe followed by 4 cycles of adjuvant systemic chemotherapy with cisplatin and Alimta and he tolerated his treatment well except for fatigue. The patient had repeat CT scan of the chest performed recently. I personally and independently reviewed the scan images and discuss the results with the patient and his wife and showed them the images today. For the CT scan showed no evidence for disease recurrence but there was a suspicious 0.8 x 0.6 cm pulmonary nodule in the peripheral left upper lobe abutting the pleura  concerning for synchronous primary bronchogenic carcinoma or metastasis. I recommended for the patient to continue on observation for now. I will repeat CT scan of the chest in 3 months for further evaluation of this lesion. The patient and his wife agreed to the current plan. For the renal insufficiency, I strongly encouraged the patient to increase  his oral intake of fluids. We will continue to monitor his renal function closely on the upcoming blood work. For hypertension, he will continue with his current blood pressure medication. He was advised to call immediately if he has any concerning symptoms in the interval. The patient voices understanding of current disease status and treatment options and is in agreement with the current care plan. All questions were answered. The patient knows to call the clinic with any problems, questions or concerns. We can certainly see the patient much sooner if necessary.  Disclaimer: This note was dictated with voice recognition software. Similar sounding words can inadvertently be transcribed and may not be corrected upon review.

## 2017-08-14 DIAGNOSIS — H6123 Impacted cerumen, bilateral: Secondary | ICD-10-CM | POA: Diagnosis not present

## 2017-10-14 ENCOUNTER — Ambulatory Visit (HOSPITAL_COMMUNITY): Admission: RE | Admit: 2017-10-14 | Payer: No Typology Code available for payment source | Source: Ambulatory Visit

## 2017-10-14 ENCOUNTER — Telehealth: Payer: Self-pay | Admitting: *Deleted

## 2017-10-14 ENCOUNTER — Encounter (HOSPITAL_COMMUNITY): Payer: Self-pay

## 2017-10-14 ENCOUNTER — Inpatient Hospital Stay: Payer: No Typology Code available for payment source | Attending: Internal Medicine

## 2017-10-14 NOTE — Telephone Encounter (Signed)
Pt no show CT scan appt Called mobile #, no answer, no voice mail has been set up. Called home # no answer- rings only. Unable to leave message for pt. Message to scheduling to cancel MD appt 6/26 as pt does not have scan results completed,

## 2017-10-15 ENCOUNTER — Telehealth: Payer: Self-pay | Admitting: Medical Oncology

## 2017-10-15 NOTE — Telephone Encounter (Signed)
Left message to return call about pt ct scan and f/u. Need to be R/S

## 2017-10-16 ENCOUNTER — Ambulatory Visit: Payer: No Typology Code available for payment source | Admitting: Oncology

## 2017-10-16 ENCOUNTER — Inpatient Hospital Stay: Payer: No Typology Code available for payment source | Admitting: Internal Medicine

## 2017-10-17 NOTE — Telephone Encounter (Signed)
Spoke to pt -CS will call him today.

## 2017-10-23 ENCOUNTER — Encounter (HOSPITAL_COMMUNITY): Payer: Self-pay

## 2017-10-23 ENCOUNTER — Inpatient Hospital Stay: Payer: BLUE CROSS/BLUE SHIELD | Attending: Internal Medicine

## 2017-10-23 ENCOUNTER — Ambulatory Visit (HOSPITAL_COMMUNITY)
Admission: RE | Admit: 2017-10-23 | Discharge: 2017-10-23 | Disposition: A | Discharge status: Home / Self Care | Payer: BLUE CROSS/BLUE SHIELD | Source: Ambulatory Visit | Attending: Internal Medicine | Admitting: Internal Medicine

## 2017-10-23 DIAGNOSIS — J948 Other specified pleural conditions: Secondary | ICD-10-CM | POA: Diagnosis not present

## 2017-10-23 DIAGNOSIS — Z9221 Personal history of antineoplastic chemotherapy: Secondary | ICD-10-CM | POA: Insufficient documentation

## 2017-10-23 DIAGNOSIS — Z923 Personal history of irradiation: Secondary | ICD-10-CM | POA: Insufficient documentation

## 2017-10-23 DIAGNOSIS — I10 Essential (primary) hypertension: Secondary | ICD-10-CM | POA: Diagnosis not present

## 2017-10-23 DIAGNOSIS — I700 Atherosclerosis of aorta: Secondary | ICD-10-CM | POA: Diagnosis not present

## 2017-10-23 DIAGNOSIS — Z85118 Personal history of other malignant neoplasm of bronchus and lung: Secondary | ICD-10-CM | POA: Insufficient documentation

## 2017-10-23 DIAGNOSIS — R918 Other nonspecific abnormal finding of lung field: Secondary | ICD-10-CM | POA: Insufficient documentation

## 2017-10-23 DIAGNOSIS — F419 Anxiety disorder, unspecified: Secondary | ICD-10-CM | POA: Diagnosis not present

## 2017-10-23 DIAGNOSIS — C3490 Malignant neoplasm of unspecified part of unspecified bronchus or lung: Secondary | ICD-10-CM

## 2017-10-23 DIAGNOSIS — J439 Emphysema, unspecified: Secondary | ICD-10-CM | POA: Diagnosis not present

## 2017-10-23 DIAGNOSIS — J984 Other disorders of lung: Secondary | ICD-10-CM | POA: Diagnosis not present

## 2017-10-23 DIAGNOSIS — C3432 Malignant neoplasm of lower lobe, left bronchus or lung: Secondary | ICD-10-CM | POA: Diagnosis not present

## 2017-10-23 LAB — CBC WITH DIFFERENTIAL (CANCER CENTER ONLY)
BASOS ABS: 0.1 10*3/uL (ref 0.0–0.1)
Basophils Relative: 1 %
EOS ABS: 0.3 10*3/uL (ref 0.0–0.5)
Eosinophils Relative: 4 %
HCT: 38.3 % — ABNORMAL LOW (ref 38.4–49.9)
HEMOGLOBIN: 12.5 g/dL — AB (ref 13.0–17.1)
Lymphocytes Relative: 17 %
Lymphs Abs: 1.2 10*3/uL (ref 0.9–3.3)
MCH: 30.2 pg (ref 27.2–33.4)
MCHC: 32.7 g/dL (ref 32.0–36.0)
MCV: 92.3 fL (ref 79.3–98.0)
MONOS PCT: 12 %
Monocytes Absolute: 0.9 10*3/uL (ref 0.1–0.9)
NEUTROS ABS: 5 10*3/uL (ref 1.5–6.5)
NEUTROS PCT: 66 %
Platelet Count: 331 10*3/uL (ref 140–400)
RBC: 4.15 MIL/uL — ABNORMAL LOW (ref 4.20–5.82)
RDW: 14 % (ref 11.0–14.6)
WBC Count: 7.5 10*3/uL (ref 4.0–10.3)

## 2017-10-23 LAB — CMP (CANCER CENTER ONLY)
ALT: 20 U/L (ref 0–44)
ANION GAP: 8 (ref 5–15)
AST: 28 U/L (ref 15–41)
Albumin: 4.2 g/dL (ref 3.5–5.0)
Alkaline Phosphatase: 106 U/L (ref 38–126)
BUN: 14 mg/dL (ref 8–23)
CALCIUM: 9.8 mg/dL (ref 8.9–10.3)
CO2: 25 mmol/L (ref 22–32)
CREATININE: 1.51 mg/dL — AB (ref 0.61–1.24)
Chloride: 102 mmol/L (ref 98–111)
GFR, EST AFRICAN AMERICAN: 55 mL/min — AB (ref >60)
GFR, EST NON AFRICAN AMERICAN: 47 mL/min — AB (ref >60)
Glucose, Bld: 89 mg/dL (ref 70–99)
Potassium: 4.7 mmol/L (ref 3.5–5.1)
Sodium: 135 mmol/L (ref 135–145)
TOTAL PROTEIN: 7.4 g/dL (ref 6.5–8.1)
Total Bilirubin: 0.5 mg/dL (ref 0.3–1.2)

## 2017-10-23 MED ORDER — IOHEXOL 300 MG/ML  SOLN
75.0000 mL | Freq: Once | INTRAMUSCULAR | Status: AC | PRN
  Administered 2017-10-23: 75 mL via INTRAVENOUS

## 2017-10-29 ENCOUNTER — Telehealth: Payer: Self-pay | Admitting: Internal Medicine

## 2017-10-29 NOTE — Telephone Encounter (Signed)
Tried to call regarding voicemail  °

## 2017-10-31 ENCOUNTER — Telehealth: Payer: Self-pay | Admitting: Internal Medicine

## 2017-10-31 NOTE — Telephone Encounter (Signed)
Returned 7/10 vm to patient and appointment and advised him of appointment date/time per  7/10 sch msg

## 2017-11-01 ENCOUNTER — Telehealth: Payer: Self-pay | Admitting: Internal Medicine

## 2017-11-01 ENCOUNTER — Inpatient Hospital Stay (HOSPITAL_BASED_OUTPATIENT_CLINIC_OR_DEPARTMENT_OTHER): Payer: BLUE CROSS/BLUE SHIELD | Admitting: Internal Medicine

## 2017-11-01 ENCOUNTER — Encounter: Payer: Self-pay | Admitting: Internal Medicine

## 2017-11-01 VITALS — BP 156/93 | HR 72 | Temp 98.2°F | Resp 18 | Ht 75.0 in | Wt 155.7 lb

## 2017-11-01 DIAGNOSIS — I10 Essential (primary) hypertension: Secondary | ICD-10-CM | POA: Diagnosis not present

## 2017-11-01 DIAGNOSIS — F419 Anxiety disorder, unspecified: Secondary | ICD-10-CM

## 2017-11-01 DIAGNOSIS — C3432 Malignant neoplasm of lower lobe, left bronchus or lung: Secondary | ICD-10-CM

## 2017-11-01 DIAGNOSIS — Z85118 Personal history of other malignant neoplasm of bronchus and lung: Secondary | ICD-10-CM

## 2017-11-01 DIAGNOSIS — N289 Disorder of kidney and ureter, unspecified: Secondary | ICD-10-CM

## 2017-11-01 DIAGNOSIS — Z923 Personal history of irradiation: Secondary | ICD-10-CM

## 2017-11-01 DIAGNOSIS — R918 Other nonspecific abnormal finding of lung field: Secondary | ICD-10-CM

## 2017-11-01 DIAGNOSIS — C3490 Malignant neoplasm of unspecified part of unspecified bronchus or lung: Secondary | ICD-10-CM

## 2017-11-01 DIAGNOSIS — Z9221 Personal history of antineoplastic chemotherapy: Secondary | ICD-10-CM

## 2017-11-01 NOTE — Telephone Encounter (Signed)
Appointments scheduled AVS/Calendar printed per 7/12 los

## 2017-11-01 NOTE — Progress Notes (Signed)
[place] [phone]   [phone]  OFFICE PROGRESS NOTE  Patient, No Pcp Per No address on file  DIAGNOSIS: stage IB (T2a, N0, M0) non-small cell lung cancer, adenocarcinoma diagnosed in December 2017.  PRIOR THERAPY:  1) status post left lower lobectomy as well as wedge resection of the left upper lobe on 05/11/2016. 2) Adjuvant systemic chemotherapy with cisplatin 75 MG/M2 and Alimta 500 MG/M2 every 3 weeks. First dose 07/03/2016. Status post 4 cycles.  CURRENT THERAPY: Observation.  INTERVAL HISTORY: [name]. 62 y.o. male returns to the clinic today for follow-up visit.  The patient is feeling fine today with no specific complaints except for anxiety about his scan results.  He denied having any current chest pain, shortness of breath, cough or hemoptysis.  He denied having any weight loss or night sweats.  He has no nausea, vomiting, diarrhea or constipation.  He has no fever or chills.  He denied having any headache or visual changes.  The patient had repeat CT scan of the chest performed recently and he is here for evaluation and discussion of his discuss results.   MEDICAL HISTORY: Past Medical History:  Diagnosis Date  . AAA (abdominal aortic aneurysm) ([place])   . Cancer ([place])    lung  . Encounter for antineoplastic chemotherapy 06/06/2016  . History of hepatitis B   . HNP (herniated nucleus pulposus), lumbar    L4 with radiculopathy  . Hypertension   . Mass of left lung   . Mitral insufficiency 04/06/2016   This patient will eventually need MV repair if his prognosis is good from oncology standpoint. However, his lung cancer therapy  is the priority right now. His cardiac condition won't preclude possible lung surgery.  Once his lung cancer is under control he will need a TEE to further evaluate his mitral valve anatomy and MR severity, and also have ischemic workup as tere is evidence on calcificati  . Renal  insufficiency 10/09/2016  . Varicose veins of legs     ALLERGIES:  is allergic to tramadol.  MEDICATIONS:  Current Outpatient Medications  Medication Sig Dispense Refill  . losartan (COZAAR) 25 MG tablet Take 1 tablet (25 mg total) by mouth daily. 90 tablet 3   No current facility-administered medications for this visit.    Facility-Administered Medications Ordered in Other Visits  Medication Dose Route Frequency Provider Last Rate Last Dose  . heparin lock flush 100 unit/mL  500 Units Intracatheter Once PRN Curt Bears, MD      . sodium chloride flush (NS) 0.9 % injection 10 mL  10 mL Intracatheter PRN Curt Bears, MD        SURGICAL HISTORY:  Past Surgical History:  Procedure Laterality Date  . ABDOMINAL AORTIC ENDOVASCULAR STENT GRAFT N/A 03/12/2016   Procedure: ABDOMINAL AORTIC ENDOVASCULAR STENT GRAFT;  Surgeon: Conrad [place], MD;  Location: [facility];  Service: Vascular;  Laterality: N/A;  . COLONOSCOPY    . INGUINAL HERNIA REPAIR Left 1975   Left inguinal hernia  . INGUINAL HERNIA REPAIR Right   . LOBECTOMY Left 05/11/2016   Procedure: LEFT LOWER LOBE LOBECTOMY AND LEFT UPPER LOBE  RESECTION AND PLACEMENT OF ON-Q;  Surgeon: [name], MD;  Location: [facility];  Service: Thoracic;  Laterality: Left;  . LUMBAR LAMINECTOMY  October 22, 2012  . LYMPH NODE DISSECTION Left 05/11/2016   Procedure: LYMPH NODE DISSECTION;  Surgeon: [name], MD;  Location: [facility];  Service: Thoracic;  Laterality: Left;  . STAPLING OF BLEBS Left 05/11/2016   Procedure: STAPLING OF APICAL BLEB;  Surgeon: [name], MD;  Location: [facility];  Service: Thoracic;  Laterality: Left;  Marland Kitchen VIDEO ASSISTED THORACOSCOPY Left 05/18/2016   Procedure: VIDEO ASSISTED THORACOSCOPY WITH REMOVAL OF LEFT APICAL BLEB;  Surgeon: [name], MD;  Location: [facility];  Service: Thoracic;  Laterality: Left;  Marland Kitchen VIDEO ASSISTED THORACOSCOPY (VATS)/WEDGE RESECTION Left 05/11/2016   Procedure: LEFT VIDEO ASSISTED  THORACOSCOPY (VATS);  Surgeon: [name], MD;  Location: [facility];  Service: Thoracic;  Laterality: Left;  Marland Kitchen VIDEO BRONCHOSCOPY N/A 05/11/2016   Procedure: VIDEO BRONCHOSCOPY, LEFT LUNG;  Surgeon: [name], MD;  Location: [facility];  Service: Thoracic;  Laterality: N/A;  . VIDEO BRONCHOSCOPY N/A 05/18/2016   Procedure: VIDEO BRONCHOSCOPY WITH BRONCHIAL WASHING;  Surgeon: [name], MD;  Location: [facility];  Service: Thoracic;  Laterality: N/A;    REVIEW OF SYSTEMS:  Constitutional: negative Eyes: negative Ears, nose, mouth, throat, and face: negative Respiratory: negative Cardiovascular: negative Gastrointestinal: negative Genitourinary:negative Integument/breast: negative Hematologic/lymphatic: negative Musculoskeletal:negative Neurological: negative Behavioral/Psych: negative Endocrine: negative Allergic/Immunologic: negative   PHYSICAL EXAMINATION: General appearance: alert, cooperative and no distress Head: Normocephalic, without obvious abnormality, atraumatic Neck: no adenopathy, no JVD, supple, symmetrical, trachea midline and thyroid not enlarged, symmetric, no tenderness/mass/nodules Lymph nodes: Cervical, supraclavicular, and axillary nodes normal. Resp: clear to auscultation bilaterally Back: symmetric, no curvature. ROM normal. No CVA tenderness. Cardio: regular rate and rhythm, S1, S2 normal, no murmur, click, rub or gallop GI: soft, non-tender; bowel sounds normal; no masses,  no organomegaly Extremities: extremities normal, atraumatic, no cyanosis or edema Neurologic: Alert and oriented X 3, normal strength and tone. Normal symmetric reflexes. Normal coordination and gait  ECOG PERFORMANCE STATUS: 1 - Symptomatic but completely ambulatory  Blood pressure (!) 156/93, pulse 72, temperature 98.2 F (36.8 C), temperature source Oral, resp. rate 18, height 6\' 3"  (1.905 m), weight 155 lb 11.2 oz (70.6 kg), SpO2 98 %.  LABORATORY DATA: Lab Results    Component Value Date   WBC 7.5 10/23/2017   HGB 12.5 (L) 10/23/2017   HCT 38.3 (L) 10/23/2017   MCV 92.3 10/23/2017   PLT 331 10/23/2017      Chemistry      Component Value Date/Time   NA 135 10/23/2017 1102   NA 138 01/11/2017 1343   K 4.7 10/23/2017 1102   K 4.7 01/11/2017 1343   CL 102 10/23/2017 1102   CO2 25 10/23/2017 1102   CO2 23 01/11/2017 1343   BUN 14 10/23/2017 1102   BUN 20.2 01/11/2017 1343   CREATININE 1.51 (H) 10/23/2017 1102   CREATININE 1.6 (H) 01/11/2017 1343      Component Value Date/Time   CALCIUM 9.8 10/23/2017 1102   CALCIUM 9.3 01/11/2017 1343   ALKPHOS 106 10/23/2017 1102   ALKPHOS 89 01/11/2017 1343   AST 28 10/23/2017 1102   AST 41 (H) 01/11/2017 1343   ALT 20 10/23/2017 1102   ALT 27 01/11/2017 1343   BILITOT 0.5 10/23/2017 1102   BILITOT 0.49 01/11/2017 1343       RADIOGRAPHIC STUDIES: Ct Chest W Contrast  Result Date: 10/23/2017 CLINICAL DATA:  Followup lung cancer. History of prior left lower lobe lobectomy and left upper lobe wedge resection. EXAM: CT CHEST WITH CONTRAST TECHNIQUE: Multidetector CT imaging of the chest was performed during intravenous contrast administration. CONTRAST:  67mL OMNIPAQUE IOHEXOL  300 MG/ML  SOLN COMPARISON:  07/16/2017. FINDINGS: Cardiovascular: The heart is normal in size. No pericardial effusion. Stable tortuosity, ectasia and calcification of the thoracic aorta. No dissection. The branch vessels are patent. The pulmonary arteries appear normal. No significant coronary artery calcifications. Mediastinum/Nodes: No mediastinal or hilar mass or adenopathy. Lungs/Pleura: Stable surgical changes from a left upper lobe wedge resection and a left lower lobe lobectomy. Two adjacent nodules in the left upper lobe have increased in size since the prior CT scan. The more medial and superior lesion on image number 44 measures 9 mm and previously measured 7 mm. The more lateral and inferior lesion measures 7 mm on image  number 48 and previously measured 4.5 mm. In the right lower lobe there is a 10 mm nodule which appears to be at least partially endobronchial. This is best seen on series 7, image 124 and series 6, image 56. This is stable. No new pulmonary nodules are identified. Stable emphysematous changes and areas of pulmonary scarring. Stable dense calcifications involving the right pleura likely related to previous infection or trauma. Upper Abdomen: No significant upper abdominal findings. No evidence of hepatic or adrenal gland metastasis. Musculoskeletal: No chest wall mass, supraclavicular or axillary adenopathy. Stable left thyroid nodule. No significant bony findings. IMPRESSION: 1. Enlarging left upper lobe pulmonary nodules worrisome for metastasis. PET-CT may be helpful for further evaluation. 2. Stable 10 mm right lower lobe pulmonary nodule which appears to be at least partially endobronchial. 3. Stable surgical changes as detailed above. 4. Stable emphysematous changes, pulmonary scarring and right pleural calcifications. 5. No mediastinal or hilar mass or lymphadenopathy. Aortic Atherosclerosis (ICD10-I70.0) and Emphysema (ICD10-J43.9). Electronically Signed   By: Marijo Sanes M.D.   On: 10/23/2017 17:51    ASSESSMENT AND PLAN:  This is a very pleasant 62 years old white male with a stage IB non-small cell lung cancer, adenocarcinoma status post wedge resection of the left upper lobe followed by 4 cycles of adjuvant systemic chemotherapy with cisplatin and Alimta and he tolerated his treatment well except for fatigue. The patient has been on observation since June 2018.  He is doing fine today with no specific complaints. He had repeat CT scan of the chest performed recently. I personally and independently reviewed the scan images and discussed the result and showed the images to the patient today. Unfortunately his scan showed further increase in the size of several pulmonary nodules in the left and  right lung. I discussed with the patient several options for management of his condition including continuous observation and close monitoring with a repeat CT scan in 2-3 months versus ordering a PET scan for further evaluation of his disease and to rule out disease recurrence at this point.  The patient is very anxious and he would like to proceed with the PET scan to rule out disease recurrence at this point. I will arrange the PET scan to be done in the next 1-2 weeks and the patient will come back for follow-up visit in 3 weeks for evaluation and further recommendation regarding his condition. For hypertension, I advised the patient to take his blood pressure medication as prescribed and to monitor it closely at home. He was advised to call immediately if he has any concerning symptoms in the interval. The patient voices understanding of current disease status and treatment options and is in agreement with the current care plan. All questions were answered. The patient knows to call the clinic with any problems, questions or  concerns. We can certainly see the patient much sooner if necessary.  Disclaimer: This note was dictated with voice recognition software. Similar sounding words can inadvertently be transcribed and may not be corrected upon review.

## 2017-11-12 ENCOUNTER — Encounter (HOSPITAL_COMMUNITY)
Admission: RE | Admit: 2017-11-12 | Discharge: 2017-11-12 | Disposition: A | Discharge status: Home / Self Care | Payer: BLUE CROSS/BLUE SHIELD | Source: Ambulatory Visit | Attending: Internal Medicine | Admitting: Internal Medicine

## 2017-11-12 DIAGNOSIS — C3490 Malignant neoplasm of unspecified part of unspecified bronchus or lung: Secondary | ICD-10-CM | POA: Insufficient documentation

## 2017-11-12 LAB — GLUCOSE, CAPILLARY: Glucose-Capillary: 115 mg/dL — ABNORMAL HIGH (ref 70–99)

## 2017-11-12 MED ORDER — FLUDEOXYGLUCOSE F - 18 (FDG) INJECTION
7.6600 | Freq: Once | INTRAVENOUS | Status: AC
  Administered 2017-11-12: 7.66 via INTRAVENOUS

## 2017-11-21 ENCOUNTER — Inpatient Hospital Stay: Payer: BLUE CROSS/BLUE SHIELD | Attending: Internal Medicine | Admitting: Oncology

## 2017-11-21 ENCOUNTER — Encounter: Payer: Self-pay | Admitting: *Deleted

## 2017-11-21 ENCOUNTER — Encounter: Payer: Self-pay | Admitting: Oncology

## 2017-11-21 ENCOUNTER — Telehealth: Payer: Self-pay | Admitting: Oncology

## 2017-11-21 VITALS — BP 153/78 | HR 66 | Temp 98.0°F | Resp 17 | Ht 75.0 in | Wt 155.1 lb

## 2017-11-21 DIAGNOSIS — Z85118 Personal history of other malignant neoplasm of bronchus and lung: Secondary | ICD-10-CM | POA: Diagnosis not present

## 2017-11-21 DIAGNOSIS — I10 Essential (primary) hypertension: Secondary | ICD-10-CM | POA: Diagnosis not present

## 2017-11-21 DIAGNOSIS — Z5112 Encounter for antineoplastic immunotherapy: Secondary | ICD-10-CM | POA: Diagnosis not present

## 2017-11-21 DIAGNOSIS — C3412 Malignant neoplasm of upper lobe, left bronchus or lung: Secondary | ICD-10-CM | POA: Diagnosis not present

## 2017-11-21 DIAGNOSIS — C3432 Malignant neoplasm of lower lobe, left bronchus or lung: Secondary | ICD-10-CM | POA: Insufficient documentation

## 2017-11-21 DIAGNOSIS — R918 Other nonspecific abnormal finding of lung field: Secondary | ICD-10-CM | POA: Diagnosis not present

## 2017-11-21 DIAGNOSIS — C3431 Malignant neoplasm of lower lobe, right bronchus or lung: Secondary | ICD-10-CM | POA: Diagnosis not present

## 2017-11-21 DIAGNOSIS — Z5111 Encounter for antineoplastic chemotherapy: Secondary | ICD-10-CM | POA: Insufficient documentation

## 2017-11-21 DIAGNOSIS — Z9221 Personal history of antineoplastic chemotherapy: Secondary | ICD-10-CM | POA: Diagnosis not present

## 2017-11-21 NOTE — Assessment & Plan Note (Signed)
This is a very pleasant 62 year old white male with a stage IB non-small cell lung cancer, adenocarcinoma status post wedge resection of the left upper lobe followed by 4 cycles of adjuvant systemic chemotherapy with cisplatin and Alimta and he tolerated his treatment well except for fatigue. The patient has been on observation since June 2018.  He is doing fine today with no specific complaints. He had repeat CT scan of the chest performed recently which showed further increase in the size of several pulmonary nodules in the left and right lung. He had a PET scan is here to discuss the results.  Patient was seen with Dr. Earlie Server.  Scan results and images were reviewed with the patient and his wife.  We explained that his PET scan is suspicious for disease recurrence with bilateral pulmonary nodules as well as a lesion in the T4 spine.  Recommend that he undergo a biopsy of the bone lesion in T4 to confirm recurrence.  We will send tissue from his original biopsy for Foundation 1 and PD-L1 testing.  Once we have these results we will discuss further treatment options.  May consider radiation to the thoracic spine once we have the biopsy results.  We also will discuss any additional treatment at his next visit.  He will follow-up in approximately 2 weeks to discuss results.  For hypertension, I advised the patient to take his blood pressure medication as prescribed and to monitor it closely at home.  He was advised to call immediately if he has any concerning symptoms in the interval. The patient voices understanding of current disease status and treatment options and is in agreement with the current care plan. All questions were answered. The patient knows to call the clinic with any problems, questions or concerns. We can certainly see the patient much sooner if necessary.

## 2017-11-21 NOTE — Telephone Encounter (Signed)
Scheduled apt per 8/1 los  - scheduled at next available after 1 wk .Sent reminder letter for patient with appt date and time.

## 2017-11-21 NOTE — Progress Notes (Signed)
[place] OFFICE PROGRESS NOTE  Default, Provider, MD No address on file  DIAGNOSIS: stage IB (T2a, N0, M0) non-small cell lung cancer, adenocarcinoma diagnosed in December 2017.  PRIOR THERAPY:  1) status post left lower lobectomy as well as wedge resection of the left upper lobe on 05/11/2016. 2) Adjuvant systemic chemotherapy with cisplatin 75 MG/M2 and Alimta 500 MG/M2 every 3 weeks. First dose 07/03/2016. Status post 4 cycles.  CURRENT THERAPY: Observation.  INTERVAL HISTORY: [name] 62 y.o. male returns for routine follow-up visit accompanied by his wife.  The patient is feeling fine today with no specific complaints.  He denies fevers and chills.  Denies chest pain, shortness of breath, cough, hemoptysis.  Denies nausea, vomiting, constipation, diarrhea.  Denies recent weight loss or night sweats.  He denies myalgias and arthralgias.  The patient had a recent PET scan is here to discuss the results.  MEDICAL HISTORY: Past Medical History:  Diagnosis Date  . AAA (abdominal aortic aneurysm) ([place])   . AAA (abdominal aortic aneurysm) without rupture ([place]) 02/04/2014  . Cancer ([place])    lung  . Encounter for antineoplastic chemotherapy 06/06/2016  . History of hepatitis B   . HNP (herniated nucleus pulposus), lumbar    L4 with radiculopathy  . Hypertension   . Lung cancer ([place]) 05/18/2016  . Malignant neoplasm of lower lobe of left lung ([place]) 04/05/2016  . Mass of left lung   . Mitral insufficiency 04/06/2016   This patient will eventually need MV repair if his prognosis is good from oncology standpoint. However, his lung cancer therapy  is the priority right now. His cardiac condition won't preclude possible lung surgery.  Once his lung cancer is under control he will need a TEE to further evaluate his mitral valve anatomy and MR severity, and also have ischemic workup as tere is evidence on calcificati  . Renal insufficiency 10/09/2016  . S/P partial  lobectomy of lung 05/11/2016  . Varicose veins of legs     ALLERGIES:  is allergic to tramadol.  MEDICATIONS:  Current Outpatient Medications  Medication Sig Dispense Refill  . losartan (COZAAR) 25 MG tablet Take 1 tablet (25 mg total) by mouth daily. 90 tablet 3   No current facility-administered medications for this visit.    Facility-Administered Medications Ordered in Other Visits  Medication Dose Route Frequency Provider Last Rate Last Dose  . heparin lock flush 100 unit/mL  500 Units Intracatheter Once PRN Curt Bears, MD      . sodium chloride flush (NS) 0.9 % injection 10 mL  10 mL Intracatheter PRN Curt Bears, MD        SURGICAL HISTORY:  Past Surgical History:  Procedure Laterality Date  . ABDOMINAL AORTIC ENDOVASCULAR STENT GRAFT N/A 03/12/2016   Procedure: ABDOMINAL AORTIC ENDOVASCULAR STENT GRAFT;  Surgeon: Conrad [place], MD;  Location: [facility];  Service: Vascular;  Laterality: N/A;  . COLONOSCOPY    . INGUINAL HERNIA REPAIR Left 1975   Left inguinal hernia  . INGUINAL HERNIA REPAIR Right   . LOBECTOMY Left 05/11/2016   Procedure: LEFT LOWER LOBE LOBECTOMY AND LEFT UPPER LOBE  RESECTION AND PLACEMENT OF ON-Q;  Surgeon: [name], MD;  Location: [facility];  Service: Thoracic;  Laterality: Left;  . LUMBAR LAMINECTOMY  October 22, 2012  . LYMPH NODE DISSECTION Left 05/11/2016   Procedure: LYMPH NODE DISSECTION;  Surgeon: [name], MD;  Location: [facility];  Service: Thoracic;  Laterality: Left;  .  STAPLING OF BLEBS Left 05/11/2016   Procedure: STAPLING OF APICAL BLEB;  Surgeon: [name], MD;  Location: [facility];  Service: Thoracic;  Laterality: Left;  Marland Kitchen VIDEO ASSISTED THORACOSCOPY Left 05/18/2016   Procedure: VIDEO ASSISTED THORACOSCOPY WITH REMOVAL OF LEFT APICAL BLEB;  Surgeon: [name], MD;  Location: [facility];  Service: Thoracic;  Laterality: Left;  Marland Kitchen VIDEO ASSISTED THORACOSCOPY (VATS)/WEDGE RESECTION Left 05/11/2016   Procedure: LEFT VIDEO  ASSISTED THORACOSCOPY (VATS);  Surgeon: [name], MD;  Location: [facility];  Service: Thoracic;  Laterality: Left;  Marland Kitchen VIDEO BRONCHOSCOPY N/A 05/11/2016   Procedure: VIDEO BRONCHOSCOPY, LEFT LUNG;  Surgeon: [name], MD;  Location: [facility];  Service: Thoracic;  Laterality: N/A;  . VIDEO BRONCHOSCOPY N/A 05/18/2016   Procedure: VIDEO BRONCHOSCOPY WITH BRONCHIAL WASHING;  Surgeon: [name], MD;  Location: [facility];  Service: Thoracic;  Laterality: N/A;    REVIEW OF SYSTEMS:   Review of Systems  Constitutional: Negative for appetite change, chills, fatigue, fever and unexpected weight change.  HENT:   Negative for mouth sores, nosebleeds, sore throat and trouble swallowing.   Eyes: Negative for eye problems and icterus.  Respiratory: Negative for cough, hemoptysis, shortness of breath and wheezing.   Cardiovascular: Negative for chest pain and leg swelling.  Gastrointestinal: Negative for abdominal pain, constipation, diarrhea, nausea and vomiting.  Genitourinary: Negative for bladder incontinence, difficulty urinating, dysuria, frequency and hematuria.   Musculoskeletal: Negative for back pain, gait problem, neck pain and neck stiffness.  Skin: Negative for itching and rash.  Neurological: Negative for dizziness, extremity weakness, gait problem, headaches, light-headedness and seizures.  Hematological: Negative for adenopathy. Does not bruise/bleed easily.  Psychiatric/Behavioral: Negative for confusion, depression and sleep disturbance. The patient is not nervous/anxious.     PHYSICAL EXAMINATION:  Blood pressure (!) 153/78, pulse 66, temperature 98 F (36.7 C), temperature source Oral, resp. rate 17, height _0  (1.905 m), weight 155 lb 1.6 oz (70.4 kg), SpO2 96 %.  ECOG PERFORMANCE STATUS: 1 - Symptomatic but completely ambulatory  Physical Exam  Constitutional: Oriented to person, place, and time and well-developed, well-nourished, and in no distress. No distress.   HENT:  Head: Normocephalic and atraumatic.  Mouth/Throat: Oropharynx is clear and moist. No oropharyngeal exudate.  Eyes: Conjunctivae are normal. Right eye exhibits no discharge. Left eye exhibits no discharge. No scleral icterus.  Neck: Normal range of motion. Neck supple.  Cardiovascular: Normal rate, regular rhythm, normal heart sounds and intact distal pulses.   Pulmonary/Chest: Effort normal and breath sounds normal. No respiratory distress. No wheezes. No rales.  Abdominal: Soft. Bowel sounds are normal. Exhibits no distension and no mass. There is no tenderness.  Musculoskeletal: Normal range of motion. Exhibits no edema.  Lymphadenopathy:    No cervical adenopathy.  Neurological: Alert and oriented to person, place, and time. Exhibits normal muscle tone. Gait normal. Coordination normal.  Skin: Skin is warm and dry. No rash noted. Not diaphoretic. No erythema. No pallor.  Psychiatric: Mood, memory and judgment normal.  Vitals reviewed.  LABORATORY DATA: Lab Results  Component Value Date   WBC 7.5 10/23/2017   HGB 12.5 (L) 10/23/2017   HCT 38.3 (L) 10/23/2017   MCV 92.3 10/23/2017   PLT 331 10/23/2017      Chemistry      Component Value Date/Time   NA 135 10/23/2017 1102   NA 138 01/11/2017 1343   K 4.7 10/23/2017 1102   K 4.7 01/11/2017 1343  CL 102 10/23/2017 1102   CO2 25 10/23/2017 1102   CO2 23 01/11/2017 1343   BUN 14 10/23/2017 1102   BUN 20.2 01/11/2017 1343   CREATININE 1.51 (H) 10/23/2017 1102   CREATININE 1.6 (H) 01/11/2017 1343      Component Value Date/Time   CALCIUM 9.8 10/23/2017 1102   CALCIUM 9.3 01/11/2017 1343   ALKPHOS 106 10/23/2017 1102   ALKPHOS 89 01/11/2017 1343   AST 28 10/23/2017 1102   AST 41 (H) 01/11/2017 1343   ALT 20 10/23/2017 1102   ALT 27 01/11/2017 1343   BILITOT 0.5 10/23/2017 1102   BILITOT 0.49 01/11/2017 1343       RADIOGRAPHIC STUDIES:  Ct Chest W Contrast  Result Date: 10/23/2017 CLINICAL DATA:  Followup  lung cancer. History of prior left lower lobe lobectomy and left upper lobe wedge resection. EXAM: CT CHEST WITH CONTRAST TECHNIQUE: Multidetector CT imaging of the chest was performed during intravenous contrast administration. CONTRAST:  37m OMNIPAQUE IOHEXOL 300 MG/ML  SOLN COMPARISON:  07/16/2017. FINDINGS: Cardiovascular: The heart is normal in size. No pericardial effusion. Stable tortuosity, ectasia and calcification of the thoracic aorta. No dissection. The branch vessels are patent. The pulmonary arteries appear normal. No significant coronary artery calcifications. Mediastinum/Nodes: No mediastinal or hilar mass or adenopathy. Lungs/Pleura: Stable surgical changes from a left upper lobe wedge resection and a left lower lobe lobectomy. Two adjacent nodules in the left upper lobe have increased in size since the prior CT scan. The more medial and superior lesion on image number 44 measures 9 mm and previously measured 7 mm. The more lateral and inferior lesion measures 7 mm on image number 48 and previously measured 4.5 mm. In the right lower lobe there is a 10 mm nodule which appears to be at least partially endobronchial. This is best seen on series 7, image 124 and series 6, image 56. This is stable. No new pulmonary nodules are identified. Stable emphysematous changes and areas of pulmonary scarring. Stable dense calcifications involving the right pleura likely related to previous infection or trauma. Upper Abdomen: No significant upper abdominal findings. No evidence of hepatic or adrenal gland metastasis. Musculoskeletal: No chest wall mass, supraclavicular or axillary adenopathy. Stable left thyroid nodule. No significant bony findings. IMPRESSION: 1. Enlarging left upper lobe pulmonary nodules worrisome for metastasis. PET-CT may be helpful for further evaluation. 2. Stable 10 mm right lower lobe pulmonary nodule which appears to be at least partially endobronchial. 3. Stable surgical changes as  detailed above. 4. Stable emphysematous changes, pulmonary scarring and right pleural calcifications. 5. No mediastinal or hilar mass or lymphadenopathy. Aortic Atherosclerosis (ICD10-I70.0) and Emphysema (ICD10-J43.9). Electronically Signed   By: PMarijo SanesM.D.   On: 10/23/2017 17:51   Nm Pet Image Restag (ps) Skull Base To Thigh  Result Date: 11/12/2017 CLINICAL DATA:  All subsequent treatment strategy for non-small-cell lung cancer. EXAM: NUCLEAR MEDICINE PET SKULL BASE TO THIGH TECHNIQUE: 7.7 mCi F-18 FDG was injected intravenously. Full-ring PET imaging was performed from the skull base to thigh after the radiotracer. CT data was obtained and used for attenuation correction and anatomic localization. Fasting blood glucose: 150 mg/dl COMPARISON:  03/07/2016 FINDINGS: Mediastinal blood pool activity: SUV max 2.1 NECK: Diffuse uptake in the parotid glands noted bilaterally, left greater than right. Incidental CT findings: none CHEST: The bilateral pulmonary nodules are hypermetabolic with SUV max = 3 up to SUV max = 8. Incidental CT findings: none ABDOMEN/PELVIS: No abnormal hypermetabolic activity within the  liver, pancreas, adrenal glands, or spleen. No hypermetabolic lymph nodes in the abdomen or pelvis. Incidental CT findings: Status post aortic endograft placement. SKELETON: 1.4 x 2.5 cm destructive lesion in the posterior elements of the T4 vertebral body demonstrates SUV max = 9.8. Associated pathologic fracture evident. Incidental CT findings: none IMPRESSION: 1. Bilateral pulmonary nodules are hypermetabolic consistent with metastatic disease. 2. 2.5 cm destructive lesion posterior elements of the T4 vertebral body is hypermetabolic consistent with bony metastatic involvement. 3. Electronically Signed   By: Misty Stanley M.D.   On: 11/12/2017 13:57     ASSESSMENT/PLAN:  Malignant neoplasm of lower lobe of left lung [facility]) This is a very pleasant 62 year old white male with a stage IB  non-small cell lung cancer, adenocarcinoma status post wedge resection of the left upper lobe followed by 4 cycles of adjuvant systemic chemotherapy with cisplatin and Alimta and he tolerated his treatment well except for fatigue. The patient has been on observation since June 2018.  He is doing fine today with no specific complaints. He had repeat CT scan of the chest performed recently which showed further increase in the size of several pulmonary nodules in the left and right lung. He had a PET scan is here to discuss the results.  Patient was seen with Dr. Earlie Server.  Scan results and images were reviewed with the patient and his wife.  We explained that his PET scan is suspicious for disease recurrence with bilateral pulmonary nodules as well as a lesion in the T4 spine.  Recommend that he undergo a biopsy of the bone lesion in T4 to confirm recurrence.  We will send tissue from his original biopsy for Foundation 1 and PD-L1 testing.  Once we have these results we will discuss further treatment options.  May consider radiation to the thoracic spine once we have the biopsy results.  We also will discuss any additional treatment at his next visit.  He will follow-up in approximately 2 weeks to discuss results.  For hypertension, I advised the patient to take his blood pressure medication as prescribed and to monitor it closely at home.  He was advised to call immediately if he has any concerning symptoms in the interval. The patient voices understanding of current disease status and treatment options and is in agreement with the current care plan. All questions were answered. The patient knows to call the clinic with any problems, questions or concerns. We can certainly see the patient much sooner if necessary.   Orders Placed This Encounter  Procedures  . CT BIOPSY    CT guided core biopsy of T4 lesion.    Standing Status:   Future    Standing Expiration Date:   02/21/2019    Order Specific  Question:   Reason for exam:    Answer:   Hypermetabolic lesion in TR on PET scan. Hx of lung cancer. Evalaute for recurrence.    Order Specific Question:   Preferred imaging location?    Answer:   [facility], [place], AGPCNP-BC, [place] 11/21/17  ADDENDUM: Hematology/Oncology Attending: I had a face-to-face encounter with the patient.  I recommended his care plan.  This is a very pleasant 62 years old white male with a stage Ib non-small cell lung cancer, adenocarcinoma status post wedge resection of the left upper lobe followed by 4 cycles of adjuvant systemic chemotherapy with cisplatin and Alimta.  He has been in observation since that time. Unfortunately recent imaging studies  including CT scan of the chest followed by a PET scan showed concerning findings for metastatic disease with pulmonary nodules in addition to T4 metastatic bone lesion. I personally and independently reviewed the scan images and discussed the result and showed the images to the patient and his wife. I recommended for the patient to have CT-guided core biopsy of the T4 lesion to rule out metastatic disease to the bone and also to identify the histology of his recurrence. We will send the initial resection specimen to foundation 1 for molecular studies and PDL 1 expression. I will arrange for the patient to come back for follow-up visit in around 2 weeks for more detailed discussion of his treatment options based on the final pathology report and molecular studies. The patient was advised to call immediately if he has any concerning symptoms in the interval.  Disclaimer: This note was dictated with voice recognition software. Similar sounding words can inadvertently be transcribed and may be missed upon review. Eilleen Kempf, MD 11/22/17

## 2017-11-21 NOTE — Progress Notes (Signed)
Oncology Nurse Navigator Documentation  Oncology Nurse Navigator Flowsheets 11/21/2017  Navigator Location [facility]-  Navigator Encounter Type Other/per Mikey Bussing NP, I requested foundation one and PDL 1 on MR. Patalano's pathology 05/11/16 [id number]  Treatment Phase Follow-up  Barriers/Navigation Needs Coordination of Care  Interventions Coordination of Care  Coordination of Care Other  Acuity Level 1  Time Spent with Patient 15

## 2017-11-26 ENCOUNTER — Ambulatory Visit (HOSPITAL_COMMUNITY): Payer: BLUE CROSS/BLUE SHIELD | Attending: Cardiology

## 2017-11-26 ENCOUNTER — Other Ambulatory Visit (HOSPITAL_COMMUNITY): Payer: No Typology Code available for payment source

## 2017-11-26 ENCOUNTER — Other Ambulatory Visit: Payer: Self-pay

## 2017-11-26 DIAGNOSIS — I081 Rheumatic disorders of both mitral and tricuspid valves: Secondary | ICD-10-CM | POA: Diagnosis not present

## 2017-11-26 DIAGNOSIS — I714 Abdominal aortic aneurysm, without rupture, unspecified: Secondary | ICD-10-CM

## 2017-11-26 DIAGNOSIS — Z87891 Personal history of nicotine dependence: Secondary | ICD-10-CM | POA: Insufficient documentation

## 2017-11-26 DIAGNOSIS — I10 Essential (primary) hypertension: Secondary | ICD-10-CM | POA: Diagnosis not present

## 2017-11-26 DIAGNOSIS — I77810 Thoracic aortic ectasia: Secondary | ICD-10-CM | POA: Insufficient documentation

## 2017-11-29 ENCOUNTER — Other Ambulatory Visit: Payer: Self-pay | Admitting: Radiology

## 2017-12-02 ENCOUNTER — Ambulatory Visit (HOSPITAL_COMMUNITY)
Admission: RE | Admit: 2017-12-02 | Discharge: 2017-12-02 | Disposition: A | Discharge status: Home / Self Care | Payer: BLUE CROSS/BLUE SHIELD | Source: Ambulatory Visit | Attending: Oncology | Admitting: Oncology

## 2017-12-02 ENCOUNTER — Encounter (HOSPITAL_COMMUNITY): Payer: Self-pay

## 2017-12-02 ENCOUNTER — Ambulatory Visit (HOSPITAL_COMMUNITY)
Admission: RE | Admit: 2017-12-02 | Discharge: 2017-12-02 | Disposition: A | Discharge status: Home / Self Care | Payer: BLUE CROSS/BLUE SHIELD | Source: Ambulatory Visit | Attending: Internal Medicine | Admitting: Internal Medicine

## 2017-12-02 DIAGNOSIS — M549 Dorsalgia, unspecified: Secondary | ICD-10-CM | POA: Diagnosis not present

## 2017-12-02 DIAGNOSIS — Z9889 Other specified postprocedural states: Secondary | ICD-10-CM | POA: Diagnosis not present

## 2017-12-02 DIAGNOSIS — C3432 Malignant neoplasm of lower lobe, left bronchus or lung: Secondary | ICD-10-CM | POA: Insufficient documentation

## 2017-12-02 DIAGNOSIS — M899 Disorder of bone, unspecified: Secondary | ICD-10-CM | POA: Diagnosis not present

## 2017-12-02 DIAGNOSIS — C7951 Secondary malignant neoplasm of bone: Secondary | ICD-10-CM | POA: Diagnosis not present

## 2017-12-02 DIAGNOSIS — R911 Solitary pulmonary nodule: Secondary | ICD-10-CM | POA: Insufficient documentation

## 2017-12-02 DIAGNOSIS — Z833 Family history of diabetes mellitus: Secondary | ICD-10-CM | POA: Diagnosis not present

## 2017-12-02 DIAGNOSIS — I714 Abdominal aortic aneurysm, without rupture: Secondary | ICD-10-CM | POA: Diagnosis not present

## 2017-12-02 DIAGNOSIS — I10 Essential (primary) hypertension: Secondary | ICD-10-CM | POA: Diagnosis not present

## 2017-12-02 DIAGNOSIS — Z87891 Personal history of nicotine dependence: Secondary | ICD-10-CM | POA: Diagnosis not present

## 2017-12-02 DIAGNOSIS — C3490 Malignant neoplasm of unspecified part of unspecified bronchus or lung: Secondary | ICD-10-CM | POA: Diagnosis not present

## 2017-12-02 DIAGNOSIS — Z888 Allergy status to other drugs, medicaments and biological substances status: Secondary | ICD-10-CM | POA: Insufficient documentation

## 2017-12-02 DIAGNOSIS — Z79899 Other long term (current) drug therapy: Secondary | ICD-10-CM | POA: Insufficient documentation

## 2017-12-02 DIAGNOSIS — Z9221 Personal history of antineoplastic chemotherapy: Secondary | ICD-10-CM | POA: Diagnosis not present

## 2017-12-02 LAB — PROTIME-INR
INR: 1.04
PROTHROMBIN TIME: 13.5 s (ref 11.4–15.2)

## 2017-12-02 LAB — BASIC METABOLIC PANEL
Anion gap: 9 (ref 5–15)
BUN: 15 mg/dL (ref 8–23)
CALCIUM: 9.5 mg/dL (ref 8.9–10.3)
CO2: 24 mmol/L (ref 22–32)
CREATININE: 1.3 mg/dL — AB (ref 0.61–1.24)
Chloride: 103 mmol/L (ref 98–111)
GFR calc Af Amer: >60 mL/min (ref >60)
GFR, EST NON AFRICAN AMERICAN: 56 mL/min — AB (ref >60)
GLUCOSE: 104 mg/dL — AB (ref 70–99)
Potassium: 3.9 mmol/L (ref 3.5–5.1)
SODIUM: 136 mmol/L (ref 135–145)

## 2017-12-02 LAB — CBC WITH DIFFERENTIAL/PLATELET
BASOS ABS: 0 10*3/uL (ref 0.0–0.1)
BASOS PCT: 0 %
EOS ABS: 0.3 10*3/uL (ref 0.0–0.7)
EOS PCT: 3 %
HCT: 41.5 % (ref 39.0–52.0)
Hemoglobin: 14 g/dL (ref 13.0–17.0)
LYMPHS PCT: 14 %
Lymphs Abs: 1.5 10*3/uL (ref 0.7–4.0)
MCH: 30.8 pg (ref 26.0–34.0)
MCHC: 33.7 g/dL (ref 30.0–36.0)
MCV: 91.2 fL (ref 78.0–100.0)
MONO ABS: 1 10*3/uL (ref 0.1–1.0)
Monocytes Relative: 9 %
Neutro Abs: 7.7 10*3/uL (ref 1.7–7.7)
Neutrophils Relative %: 74 %
PLATELETS: 382 10*3/uL (ref 150–400)
RBC: 4.55 MIL/uL (ref 4.22–5.81)
RDW: 13.3 % (ref 11.5–15.5)
WBC: 10.5 10*3/uL (ref 4.0–10.5)

## 2017-12-02 MED ORDER — FENTANYL CITRATE (PF) 100 MCG/2ML IJ SOLN
INTRAMUSCULAR | Status: AC
  Filled 2017-12-02: qty 4

## 2017-12-02 MED ORDER — NALOXONE HCL 0.4 MG/ML IJ SOLN
INTRAMUSCULAR | Status: AC
  Filled 2017-12-02: qty 1

## 2017-12-02 MED ORDER — SODIUM CHLORIDE 0.9 % IV SOLN: INTRAVENOUS | Status: DC

## 2017-12-02 MED ORDER — MIDAZOLAM HCL 2 MG/2ML IJ SOLN
INTRAMUSCULAR | Status: AC | PRN
  Administered 2017-12-02 (×2): 1 mg via INTRAVENOUS

## 2017-12-02 MED ORDER — FLUMAZENIL 0.5 MG/5ML IV SOLN
INTRAVENOUS | Status: AC
  Filled 2017-12-02: qty 5

## 2017-12-02 MED ORDER — FENTANYL CITRATE (PF) 100 MCG/2ML IJ SOLN
INTRAMUSCULAR | Status: AC | PRN
  Administered 2017-12-02 (×2): 50 ug via INTRAVENOUS

## 2017-12-02 MED ORDER — MIDAZOLAM HCL 2 MG/2ML IJ SOLN
INTRAMUSCULAR | Status: AC
  Filled 2017-12-02: qty 4

## 2017-12-02 MED ORDER — LIDOCAINE HCL (PF) 1 % IJ SOLN
INTRAMUSCULAR | Status: AC | PRN
  Administered 2017-12-02: 10 mL

## 2017-12-02 NOTE — Discharge Instructions (Signed)
Moderate Conscious Sedation, Adult, Care After These instructions provide you with information about caring for yourself after your procedure. Your health care provider may also give you more specific instructions. Your treatment has been planned according to current medical practices, but problems sometimes occur. Call your health care provider if you have any problems or questions after your procedure. What can I expect after the procedure? After your procedure, it is common:  To feel sleepy for several hours.  To feel clumsy and have poor balance for several hours.  To have poor judgment for several hours.  To vomit if you eat too soon.  Follow these instructions at home: For at least 24 hours after the procedure:   Do not: ? Participate in activities where you could fall or become injured. ? Drive. ? Use heavy machinery. ? Drink alcohol. ? Take sleeping pills or medicines that cause drowsiness. ? Make important decisions or sign legal documents. ? Take care of children on your own.  Rest. Eating and drinking  Follow the diet recommended by your health care provider.  If you vomit: ? Drink water, juice, or soup when you can drink without vomiting. ? Make sure you have little or no nausea before eating solid foods. General instructions  Have a responsible adult stay with you until you are awake and alert.  Take over-the-counter and prescription medicines only as told by your health care provider.  If you smoke, do not smoke without supervision.  Keep all follow-up visits as told by your health care provider. This is important. Contact a health care provider if:  You keep feeling nauseous or you keep vomiting.  You feel light-headed.  You develop a rash.  You have a fever. Get help right away if:  You have trouble breathing. This information is not intended to replace advice given to you by your health care provider. Make sure you discuss any questions you have  with your health care provider. Document Released: 01/28/2013 Document Revised: 09/12/2015 Document Reviewed: 07/30/2015 Elsevier Interactive Patient Education  2018 [employer].   Needle Biopsy of the Bone, Care After Refer to this sheet in the next few weeks. These instructions provide you with information about caring for yourself after your procedure. Your health care provider may also give you more specific instructions. Your treatment has been planned according to current medical practices, but problems sometimes occur. Call your health care provider if you have any problems or questions after your procedure. What can I expect after the procedure? After your procedure, it is common to have soreness or tenderness at the puncture site. Follow these instructions at home:  Take over-the-counter and prescription medicines only as told by your health care provider.  Bathe and shower as told by your health care provider.  You may shower tomorrow.  Follow instructions from your health care provider about: ? How to take care of your puncture site. ? When and how you should change your bandage (dressing). ? When you should remove your dressing.  You may remove your dressing tomorrow.  May place band aid on biopsy site.  Check your puncture site every day for signs of infection. Watch for: ? Redness, swelling, or worsening pain. ? Fluid, blood, or pus.  Return to your normal activities as told by your health care provider.  Keep all follow-up visits as told by your health care provider. This is important. Contact a health care provider if:  You have redness, swelling, or worsening pain at the site  of your puncture.  You have fluid, blood, or pus coming from your puncture site.  You have a fever.  You have persistent nausea or vomiting. Get help right away if:  You develop a rash.  You have difficulty breathing. This information is not intended to replace advice given to you by  your health care provider. Make sure you discuss any questions you have with your health care provider. Document Released: 10/27/2004 Document Revised: 09/15/2015 Document Reviewed: 05/17/2014 Elsevier Interactive Patient Education  [employer].

## 2017-12-02 NOTE — H&P (Signed)
Chief Complaint: Patient was seen in consultation today for T4 bone lesion  Referring Physician(s): [place] R  Supervising Physician: Markus Daft  Patient Status: [facility] - Out-pt  History of Present Illness: William Mcclaine. is a 62 y.o. male with past medical history of lung cancer s/p treatment who has been under surveillance since 2018.    CT Chest 10/23/17 showed: 1. Enlarging left upper lobe pulmonary nodules worrisome for metastasis. PET-CT may be helpful for further evaluation. 2. Stable 10 mm right lower lobe pulmonary nodule which appears to be at least partially endobronchial. 3. Stable surgical changes as detailed above. 4. Stable emphysematous changes, pulmonary scarring and right pleural calcifications.  IR consulted for bone lesion biopsy at the request of Mikey Bussing, NP.  Case reviewed by Dr. Anselm Pancoast who approves patient for procedure.   He presents today in his usual state of health.  His only complaint is back pain.  He has been NPO.  He does not take blood thinners.   Past Medical History:  Diagnosis Date  . AAA (abdominal aortic aneurysm) ([place])   . AAA (abdominal aortic aneurysm) without rupture ([place]) 02/04/2014  . Cancer ([place])    lung  . Encounter for antineoplastic chemotherapy 06/06/2016  . History of hepatitis B   . HNP (herniated nucleus pulposus), lumbar    L4 with radiculopathy  . Hypertension   . Lung cancer ([place]) 05/18/2016  . Malignant neoplasm of lower lobe of left lung ([place]) 04/05/2016  . Mass of left lung   . Mitral insufficiency 04/06/2016   This patient will eventually need MV repair if his prognosis is good from oncology standpoint. However, his lung cancer therapy  is the priority right now. His cardiac condition won't preclude possible lung surgery.  Once his lung cancer is under control he will need a TEE to further evaluate his mitral valve anatomy and MR severity, and also have ischemic workup as tere is evidence on calcificati    . Renal insufficiency 10/09/2016  . S/P partial lobectomy of lung 05/11/2016  . Varicose veins of legs     Past Surgical History:  Procedure Laterality Date  . ABDOMINAL AORTIC ENDOVASCULAR STENT GRAFT N/A 03/12/2016   Procedure: ABDOMINAL AORTIC ENDOVASCULAR STENT GRAFT;  Surgeon: Conrad [place], MD;  Location: [place];  Service: Vascular;  Laterality: N/A;  . COLONOSCOPY    . INGUINAL HERNIA REPAIR Left 1975   Left inguinal hernia  . INGUINAL HERNIA REPAIR Right   . LOBECTOMY Left 05/11/2016   Procedure: LEFT LOWER LOBE LOBECTOMY AND LEFT UPPER LOBE  RESECTION AND PLACEMENT OF ON-Q;  Surgeon: [name], MD;  Location: [place];  Service: Thoracic;  Laterality: Left;  . LUMBAR LAMINECTOMY  October 22, 2012  . LYMPH NODE DISSECTION Left 05/11/2016   Procedure: LYMPH NODE DISSECTION;  Surgeon: [name], MD;  Location: [place];  Service: Thoracic;  Laterality: Left;  . STAPLING OF BLEBS Left 05/11/2016   Procedure: STAPLING OF APICAL BLEB;  Surgeon: [name], MD;  Location: [place];  Service: Thoracic;  Laterality: Left;  Marland Kitchen VIDEO ASSISTED THORACOSCOPY Left 05/18/2016   Procedure: VIDEO ASSISTED THORACOSCOPY WITH REMOVAL OF LEFT APICAL BLEB;  Surgeon: [name], MD;  Location: [place];  Service: Thoracic;  Laterality: Left;  Marland Kitchen VIDEO ASSISTED THORACOSCOPY (VATS)/WEDGE RESECTION Left 05/11/2016   Procedure: LEFT VIDEO ASSISTED THORACOSCOPY (VATS);  Surgeon: [name], MD;  Location: [place];  Service: Thoracic;  Laterality: Left;  .  VIDEO BRONCHOSCOPY N/A 05/11/2016   Procedure: VIDEO BRONCHOSCOPY, LEFT LUNG;  Surgeon: [name], MD;  Location: [place];  Service: Thoracic;  Laterality: N/A;  . VIDEO BRONCHOSCOPY N/A 05/18/2016   Procedure: VIDEO BRONCHOSCOPY WITH BRONCHIAL WASHING;  Surgeon: [name], MD;  Location[facility];  Service: Thoracic;  Laterality: N/A;    Allergies: Tramadol  Medications: Prior to Admission medications   Medication Sig Start Date  End Date Taking? Authorizing Provider  losartan (COZAAR) 25 MG tablet Take 1 tablet (25 mg total) by mouth daily. 02/20/17  Yes Dorothy Spark, MD     Family History  Problem Relation Age of Onset  . Diabetes Mother     Social History   Socioeconomic History  . Marital status: Married    Spouse name: Not on file  . Number of children: Not on file  . Years of education: Not on file  . Highest education level: Not on file  Occupational History  . Not on file  Social Needs  . Financial resource strain: Not on file  . Food insecurity:    Worry: Not on file    Inability: Not on file  . Transportation needs:    Medical: Not on file    Non-medical: Not on file  Tobacco Use  . Smoking status: Former Smoker    Packs/day: 1.00    Types: Cigarettes    Last attempt to quit: 03/05/2016    Years since quitting: 1.7  . Smokeless tobacco: Never Used  Substance and Sexual Activity  . Alcohol use: Yes    Alcohol/week: 6.0 standard drinks    Types: 6 Cans of beer per week  . 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 Sexual activity: Not on file  Lifestyle  . Physical activity:    Days per week: Not on file    Minutes per session: Not on file  . Stress: Not on file  Relationships  . Social connections:    Talks on phone: Not on file    Gets together: Not on file    Attends religious service: Not on file    Active member of club or organization: Not on file    Attends meetings of clubs or organizations: Not on file    Relationship status: Not on file  Other Topics Concern  . Not on file  Social History Narrative  . Not on file     Review of Systems: A 12 point ROS discussed and pertinent positives are indicated in the HPI above.  All other systems are negative.  Review of Systems  Constitutional: Negative for fatigue and fever.  Respiratory: Negative for cough and shortness of breath.   Cardiovascular: Negative for chest pain.    Gastrointestinal: Negative for abdominal pain, nausea and vomiting.  Musculoskeletal: Positive for back pain.  Psychiatric/Behavioral: Negative for behavioral problems and confusion.    Vital Signs: BP (!) 165/89 (BP Location: Right Arm)   Pulse 76   Temp 98 F (36.7 C) (Oral)   Resp 18   SpO2 100%   Physical Exam  Constitutional: He is oriented to person, place, and time. He appears well-developed. No distress.  Neck: Normal range of motion. Neck supple. No tracheal deviation present.  Cardiovascular: Normal rate, regular rhythm and normal heart sounds. Exam reveals no gallop and no friction rub.  No murmur heard. Pulmonary/Chest: Effort normal and breath sounds normal. No respiratory distress.  He has no wheezes.  Abdominal: Soft. Bowel sounds are normal.  Neurological: He is alert and oriented to person, place, and time.  Skin: Skin is warm and dry. He is not diaphoretic.  Psychiatric: He has a normal mood and affect. His behavior is normal. Judgment and thought content normal.  Nursing note and vitals reviewed.    MD Evaluation Airway: WNL Heart: WNL Abdomen: WNL Chest/ Lungs: WNL ASA  Classification: 3 Mallampati/Airway Score: One   Imaging: Nm Pet Image Restag (ps) Skull Base To Thigh  Result Date: 11/12/2017 CLINICAL DATA:  All subsequent treatment strategy for non-small-cell lung cancer. EXAM: NUCLEAR MEDICINE PET SKULL BASE TO THIGH TECHNIQUE: 7.7 mCi F-18 FDG was injected intravenously. Full-ring PET imaging was performed from the skull base to thigh after the radiotracer. CT data was obtained and used for attenuation correction and anatomic localization. Fasting blood glucose: 150 mg/dl COMPARISON:  03/07/2016 FINDINGS: Mediastinal blood pool activity: SUV max 2.1 NECK: Diffuse uptake in the parotid glands noted bilaterally, left greater than right. Incidental CT findings: none CHEST: The bilateral pulmonary nodules are hypermetabolic with SUV max = 3 up to SUV max  = 8. Incidental CT findings: none ABDOMEN/PELVIS: No abnormal hypermetabolic activity within the liver, pancreas, adrenal glands, or spleen. No hypermetabolic lymph nodes in the abdomen or pelvis. Incidental CT findings: Status post aortic endograft placement. SKELETON: 1.4 x 2.5 cm destructive lesion in the posterior elements of the T4 vertebral body demonstrates SUV max = 9.8. Associated pathologic fracture evident. Incidental CT findings: none IMPRESSION: 1. Bilateral pulmonary nodules are hypermetabolic consistent with metastatic disease. 2. 2.5 cm destructive lesion posterior elements of the T4 vertebral body is hypermetabolic consistent with bony metastatic involvement. 3. Electronically Signed   By: Misty Stanley M.D.   On: 11/12/2017 13:57    Labs:  CBC: Recent Labs    01/11/17 1343 07/16/17 0936 10/23/17 1102 12/02/17 0905  WBC 6.2 8.6 7.5 10.5  HGB 13.2 12.8* 12.5* 14.0  HCT 39.0 38.8 38.3* 41.5  PLT 216 229 331 382    COAGS: Recent Labs    12/02/17 0905  INR 1.04    BMP: Recent Labs    01/11/17 1343 07/16/17 0936 10/23/17 1102 12/02/17 0905  NA 138 138 135 136  K 4.7 5.0 4.7 3.9  CL  --  108 102 103  CO2 23 22 25 24   GLUCOSE 89 104 89 104*  BUN 20.2 18 14 15   CALCIUM 9.3 9.5 9.8 9.5  CREATININE 1.6* 1.52* 1.51* 1.30*  GFRNONAA  --  47* 47* 56*  GFRAA  --  55* 55* >60    LIVER FUNCTION TESTS: Recent Labs    01/11/17 1343 07/16/17 0936 10/23/17 1102  BILITOT 0.49 0.6 0.5  AST 41* 36* 28  ALT 27 31 20   ALKPHOS 89 75 106  PROT 7.7 7.5 7.4  ALBUMIN 4.0 4.2 4.2    TUMOR MARKERS: No results for input(s): AFPTM, CEA, CA199, CHROMGRNA in the last 8760 hours.  Assessment and Plan: Patient with past medical history of lung cancer presents with complaint of recent findings on CT of a T4 lesion.  IR consulted for bone lesion biopsy at the request of Mikey Bussing, NP. Case reviewed by Dr. Anselm Pancoast who approves patient for procedure.  Patient presents today  in their usual state of health.  He has been NPO and is not currently on blood thinners.   Risks and benefits discussed with the patient including, but not limited to bleeding, infection,  damage to adjacent structures or low yield requiring additional tests.  All of the patient's questions were answered, patient is agreeable to proceed. Consent signed and in chart.  Thank you for this interesting consult.  I greatly enjoyed meeting William Kales [name] and look forward to participating in their care.  A copy of this report was sent to the requesting provider on this date.  Electronically Signed: Docia Barrier, PA 12/02/2017, 10:47 AM   I spent a total of  30 Minutes   in face to face in clinical consultation, greater than 50% of which was counseling/coordinating care for T4 bone lesion.

## 2017-12-02 NOTE — Procedures (Signed)
  Pre-operative Diagnosis: Metastatic lung cancer       Post-operative Diagnosis: Metastatic lung cancer   Indications: T4 destructive lesion and needs tissue sampling to confirm recurrence and metastatic disease  Procedure: CT guided biopsy of T4 lesion  Findings: Lytic lesion along right posterior elements of T4.  5 FNAs obtained.  Cores not obtained due to small size of lesion and close proximity to spinal cord and epidural space  Complications: None     EBL: Minimal  Plan: Bedrest 2 hours then discharge to home.

## 2017-12-04 ENCOUNTER — Encounter (HOSPITAL_COMMUNITY): Payer: Self-pay | Admitting: Internal Medicine

## 2017-12-04 NOTE — Progress Notes (Signed)
FMLA forms also mailed to [place] [facility] [place] [place] [id number], [place], [place] [address].

## 2017-12-04 NOTE — Progress Notes (Signed)
FMLA for spouse, Brodric Schauer, completed and mailed to patient address on file. No fax number provided to fax forms to.

## 2017-12-12 ENCOUNTER — Inpatient Hospital Stay (HOSPITAL_BASED_OUTPATIENT_CLINIC_OR_DEPARTMENT_OTHER): Payer: BLUE CROSS/BLUE SHIELD | Admitting: Internal Medicine

## 2017-12-12 ENCOUNTER — Telehealth: Payer: Self-pay

## 2017-12-12 VITALS — BP 131/88 | HR 74 | Temp 98.9°F | Resp 18 | Ht 75.0 in | Wt 152.4 lb

## 2017-12-12 DIAGNOSIS — C7951 Secondary malignant neoplasm of bone: Secondary | ICD-10-CM | POA: Diagnosis not present

## 2017-12-12 DIAGNOSIS — C3432 Malignant neoplasm of lower lobe, left bronchus or lung: Secondary | ICD-10-CM

## 2017-12-12 DIAGNOSIS — Z5111 Encounter for antineoplastic chemotherapy: Secondary | ICD-10-CM | POA: Diagnosis not present

## 2017-12-12 DIAGNOSIS — C3431 Malignant neoplasm of lower lobe, right bronchus or lung: Secondary | ICD-10-CM | POA: Diagnosis not present

## 2017-12-12 DIAGNOSIS — Z5112 Encounter for antineoplastic immunotherapy: Secondary | ICD-10-CM | POA: Diagnosis not present

## 2017-12-12 DIAGNOSIS — C3412 Malignant neoplasm of upper lobe, left bronchus or lung: Secondary | ICD-10-CM

## 2017-12-12 DIAGNOSIS — C3490 Malignant neoplasm of unspecified part of unspecified bronchus or lung: Secondary | ICD-10-CM

## 2017-12-12 DIAGNOSIS — I10 Essential (primary) hypertension: Secondary | ICD-10-CM | POA: Diagnosis not present

## 2017-12-12 DIAGNOSIS — R918 Other nonspecific abnormal finding of lung field: Secondary | ICD-10-CM | POA: Diagnosis not present

## 2017-12-12 DIAGNOSIS — C3492 Malignant neoplasm of unspecified part of left bronchus or lung: Secondary | ICD-10-CM | POA: Insufficient documentation

## 2017-12-12 DIAGNOSIS — Z85118 Personal history of other malignant neoplasm of bronchus and lung: Secondary | ICD-10-CM | POA: Diagnosis not present

## 2017-12-12 DIAGNOSIS — Z7189 Other specified counseling: Secondary | ICD-10-CM

## 2017-12-12 DIAGNOSIS — Z9221 Personal history of antineoplastic chemotherapy: Secondary | ICD-10-CM | POA: Diagnosis not present

## 2017-12-12 MED ORDER — FOLIC ACID 1 MG PO TABS: 1.0000 mg | ORAL_TABLET | Freq: Every day | ORAL | 4 refills | Status: DC

## 2017-12-12 MED ORDER — PROCHLORPERAZINE MALEATE 10 MG PO TABS: 10.0000 mg | ORAL_TABLET | Freq: Four times a day (QID) | ORAL | 0 refills | Status: DC | PRN

## 2017-12-12 MED ORDER — CYANOCOBALAMIN 1000 MCG/ML IJ SOLN
1000.0000 ug | Freq: Once | INTRAMUSCULAR | Status: AC
  Administered 2017-12-12: 1000 ug via INTRAMUSCULAR

## 2017-12-12 NOTE — Progress Notes (Signed)
[place] [phone]   [phone]  OFFICE PROGRESS NOTE  Default, Provider, MD No address on file  DIAGNOSIS: Metastatic non-small cell lung cancer, adenocarcinoma initially diagnosed as stage IB (T2a, N0, M0) non-small cell lung cancer, adenocarcinoma diagnosed in December 2017.  The patient has evidence for disease recurrence in July 2019.  Biomarker Findings Tumor Mutational Burden - TMB-Intermediate (16 Muts/Mb) Microsatellite status - MS-Stable Genomic Findings For a complete list of the genes assayed, please refer to the Appendix. KIT amplification KRAS Q94T SMAD4 splice site 6546-5K>P TW65 I232F 7 Disease relevant genes with no reportable alterations: EGFR, ALK, BRAF, MET, RET, ERBB2, ROS1  PDL 1 expression is 0%  PRIOR THERAPY:  1) status post left lower lobectomy as well as wedge resection of the left upper lobe on 05/11/2016. 2) Adjuvant systemic chemotherapy with cisplatin 75 MG/M2 and Alimta 500 MG/M2 every 3 weeks. First dose 07/03/2016. Status post 4 cycles.  CURRENT THERAPY: Systemic chemotherapy with carboplatin for AUC of 5, Alimta 500 mg/M2 and Keytruda 200 mg IV every 3 weeks.  INTERVAL HISTORY: William Kales [name] 62 y.o. male returns to the clinic today for follow-up visit accompanied by his wife.  The patient is feeling fine today with no concerning complaints except for fatigue.  He denied having any chest pain, shortness of breath, cough or hemoptysis.  He denied having any fever or chills.  He has no nausea, vomiting, diarrhea or constipation.  He has no weight loss or night sweats.  The patient denied having any headache or visual changes.  He had CT-guided core biopsy of CT for lytic bone lesion by interventional radiology on December 02, 2017 and the final pathology was consistent with metastatic adenocarcinoma.  He has molecular studies done by foundation 1 and unfortunately it did not show any actionable  mutations.  PDL 1 expression was 0%.  The patient is here today for evaluation and discussion of his treatment options.   MEDICAL HISTORY: Past Medical History:  Diagnosis Date  . AAA (abdominal aortic aneurysm) ([place])   . AAA (abdominal aortic aneurysm) without rupture ([place]) 02/04/2014  . Cancer ([place])    lung  . Encounter for antineoplastic chemotherapy 06/06/2016  . History of hepatitis B   . HNP (herniated nucleus pulposus), lumbar    L4 with radiculopathy  . Hypertension   . Lung cancer ([place]) 05/18/2016  . Malignant neoplasm of lower lobe of left lung ([place]) 04/05/2016  . Mass of left lung   . Mitral insufficiency 04/06/2016   This patient will eventually need MV repair if his prognosis is good from oncology standpoint. However, his lung cancer therapy  is the priority right now. His cardiac condition won't preclude possible lung surgery.  Once his lung cancer is under control he will need a TEE to further evaluate his mitral valve anatomy and MR severity, and also have ischemic workup as tere is evidence on calcificati  . Renal insufficiency 10/09/2016  . S/P partial lobectomy of lung 05/11/2016  . Varicose veins of legs     ALLERGIES:  is allergic to tramadol.  MEDICATIONS:  Current Outpatient Medications  Medication Sig Dispense Refill  . losartan (COZAAR) 25 MG tablet Take 1 tablet (25 mg total) by mouth daily. 90 tablet 3   No current facility-administered medications for this visit.    Facility-Administered Medications Ordered in Other Visits  Medication Dose Route Frequency Provider Last Rate Last Dose  . heparin  lock flush 100 unit/mL  500 Units Intracatheter Once PRN Curt Bears, MD      . sodium chloride flush (NS) 0.9 % injection 10 mL  10 mL Intracatheter PRN Curt Bears, MD        SURGICAL HISTORY:  Past Surgical History:  Procedure Laterality Date  . ABDOMINAL AORTIC ENDOVASCULAR STENT GRAFT N/A 03/12/2016   Procedure: ABDOMINAL AORTIC ENDOVASCULAR  STENT GRAFT;  Surgeon: Conrad Lawtey, MD;  Location: [place];  Service: Vascular;  Laterality: N/A;  . COLONOSCOPY    . INGUINAL HERNIA REPAIR Left 1975   Left inguinal hernia  . INGUINAL HERNIA REPAIR Right   . LOBECTOMY Left 05/11/2016   Procedure: LEFT LOWER LOBE LOBECTOMY AND LEFT UPPER LOBE  RESECTION AND PLACEMENT OF ON-Q;  Surgeon: [name], MD;  Location: [place];  Service: Thoracic;  Laterality: Left;  . LUMBAR LAMINECTOMY  October 22, 2012  . LYMPH NODE DISSECTION Left 05/11/2016   Procedure: LYMPH NODE DISSECTION;  Surgeon: [name], MD;  Location: [facility];  Service: Thoracic;  Laterality: Left;  . STAPLING OF BLEBS Left 05/11/2016   Procedure: STAPLING OF APICAL BLEB;  Surgeon: [name], MD;  Location: [place];  Service: Thoracic;  Laterality: Left;  Marland Kitchen VIDEO ASSISTED THORACOSCOPY Left 05/18/2016   Procedure: VIDEO ASSISTED THORACOSCOPY WITH REMOVAL OF LEFT APICAL BLEB;  Surgeon: [name], MD;  Location: [place];  Service: Thoracic;  Laterality: Left;  Marland Kitchen VIDEO ASSISTED THORACOSCOPY (VATS)/WEDGE RESECTION Left 05/11/2016   Procedure: LEFT VIDEO ASSISTED THORACOSCOPY (VATS);  Surgeon: [name], MD;  Location: [place];  Service: Thoracic;  Laterality: Left;  Marland Kitchen VIDEO BRONCHOSCOPY N/A 05/11/2016   Procedure: VIDEO BRONCHOSCOPY, LEFT LUNG;  Surgeon: [name], MD;  Location: [place];  Service: Thoracic;  Laterality: N/A;  . VIDEO BRONCHOSCOPY N/A 05/18/2016   Procedure: VIDEO BRONCHOSCOPY WITH BRONCHIAL WASHING;  Surgeon: [name], MD;  Location: [place];  Service: Thoracic;  Laterality: N/A;    REVIEW OF SYSTEMS:  Constitutional: positive for fatigue Eyes: negative Ears, nose, mouth, throat, and face: negative Respiratory: negative Cardiovascular: negative Gastrointestinal: negative Genitourinary:negative Integument/breast: negative Hematologic/lymphatic: negative Musculoskeletal:negative Neurological: negative Behavioral/Psych:  negative Endocrine: negative Allergic/Immunologic: negative   PHYSICAL EXAMINATION: General appearance: alert, cooperative and no distress Head: Normocephalic, without obvious abnormality, atraumatic Neck: no adenopathy, no JVD, supple, symmetrical, trachea midline and thyroid not enlarged, symmetric, no tenderness/mass/nodules Lymph nodes: Cervical, supraclavicular, and axillary nodes normal. Resp: clear to auscultation bilaterally Back: symmetric, no curvature. ROM normal. No CVA tenderness. Cardio: regular rate and rhythm, S1, S2 normal, no murmur, click, rub or gallop GI: soft, non-tender; bowel sounds normal; no masses,  no organomegaly Extremities: extremities normal, atraumatic, no cyanosis or edema Neurologic: Alert and oriented X 3, normal strength and tone. Normal symmetric reflexes. Normal coordination and gait  ECOG PERFORMANCE STATUS: 1 - Symptomatic but completely ambulatory  Blood pressure 131/88, pulse 74, temperature 98.9 F (37.2 C), temperature source Oral, resp. rate 18, height _0  (1.905 m), weight 152 lb 6.4 oz (69.1 kg), SpO2 97 %.  LABORATORY DATA: Lab Results  Component Value Date   WBC 10.5 12/02/2017   HGB 14.0 12/02/2017   HCT 41.5 12/02/2017   MCV 91.2 12/02/2017   PLT 382 12/02/2017      Chemistry      Component Value Date/Time   NA 136 12/02/2017 0905   NA 138 01/11/2017 1343   K 3.9 12/02/2017 0905   K  4.7 01/11/2017 1343   CL 103 12-08-17 0905   CO2 24 December 08, 2017 0905   CO2 23 01/11/2017 1343   BUN 15 12/08/17 0905   BUN 20.2 01/11/2017 1343   CREATININE 1.30 (H) 12/08/17 0905   CREATININE 1.51 (H) 10/23/2017 1102   CREATININE 1.6 (H) 01/11/2017 1343      Component Value Date/Time   CALCIUM 9.5 2017-12-08 0905   CALCIUM 9.3 01/11/2017 1343   ALKPHOS 106 10/23/2017 1102   ALKPHOS 89 01/11/2017 1343   AST 28 10/23/2017 1102   AST 41 (H) 01/11/2017 1343   ALT 20 10/23/2017 1102   ALT 27 01/11/2017 1343   BILITOT 0.5  10/23/2017 1102   BILITOT 0.49 01/11/2017 1343       RADIOGRAPHIC STUDIES: Ct Biopsy  Result Date: 08-Dec-2017 INDICATION: 62 year old with history of non-small cell lung cancer. Patient has evidence for recurrent cancer and metastatic disease in the spine. Request for a biopsy of the T4 lytic lesion. EXAM: CT-GUIDED BIOPSY OF T4 LYTIC BONE LESION MEDICATIONS: None. ANESTHESIA/SEDATION: Moderate (conscious) sedation was employed during this procedure. A total of Versed 2 mg and Fentanyl 100 mcg was administered intravenously. Moderate Sedation Time: 30 minutes. The patient's level of consciousness and vital signs were monitored continuously by radiology nursing throughout the procedure under my direct supervision. FLUOROSCOPY TIME:  None COMPLICATIONS: None immediate. PROCEDURE: Informed written consent was obtained from the patient after a thorough discussion of the procedural risks, benefits and alternatives. All questions were addressed. A timeout was performed prior to the initiation of the procedure. Patient was placed prone on the CT scanner. Images through the upper chest were obtained. The lesion involving the posterior elements of T4 was identified. The back was prepped with chlorhexidine and sterile field was created. 34 gauge needle was directed onto the posterior aspect of the lesion CT guidance. Due to the small size of the lesion and the close proximity with the epidural space and spinal cord, core biopsies were not obtained. Five fine-needle aspirations were obtained with 22 gauge needles. 17 gauge needle was removed without complication. Bandage placed over the puncture site. FINDINGS: Destructive lytic lesion involving the posterior elements of T4 particularly along the right side. Needle position was confirmed within the lesion. Fine-needle aspirations were obtained. No significant bleeding or hematoma formation. Patient tolerated the procedure well. Again noted are small pulmonary nodules  along the posterior aspect of the left lung. IMPRESSION: CT-guided biopsy of the T4 lytic bone lesion. Electronically Signed   By: Markus Daft M.D.   On: 12-08-17 15:32    ASSESSMENT AND PLAN:  This is a very pleasant 62 years old white male with a stage IB non-small cell lung cancer, adenocarcinoma status post wedge resection of the left upper lobe followed by 4 cycles of adjuvant systemic chemotherapy with cisplatin and Alimta and he tolerated his treatment well except for fatigue. The patient has been on observation since June 2018.   The recent imaging studies including CT scan of the chest as well as a PET scan showed evidence for disease recurrence with metastatic disease presented with bilateral pulmonary nodules as well as destructive bone lesions at T4 vertebral body, biopsy proven to be metastatic adenocarcinoma. Molecular studies by foundation 1 showed no actionable mutations.  PDL 1 expression was negative. I had a lengthy discussion with the patient and his wife today, about his current disease, prognosis and treatment options. I discussed with the patient the option of palliative care and hospice referral versus  consideration of palliative systemic chemotherapy with carboplatin for AUC of 5, Alimta 500 mg/M2 and Keytruda 200 mg IV every 3 weeks.  The patient is interested in proceeding with systemic chemotherapy. I discussed with the patient the adverse effect of this treatment including but not limited to alopecia, myelosuppression, nausea and vomiting, peripheral neuropathy, liver or renal dysfunction. I will arrange for the patient to receive vitamin B12 today. I will send prescription to his pharmacy for folic acid 1 mg p.o. daily as well as Compazine 10 mg p.o. every 6 hours as needed for nausea. The patient is expected to start the first cycle of this treatment next week. He will come back for follow-up visit in 4 weeks for evaluation before starting the next cycle of his  treatment. He was advised to call immediately if he has any concerning symptoms in the interval. The patient voices understanding of current disease status and treatment options and is in agreement with the current care plan. All questions were answered. The patient knows to call the clinic with any problems, questions or concerns. We can certainly see the patient much sooner if necessary.  Disclaimer: This note was dictated with voice recognition software. Similar sounding words can inadvertently be transcribed and may not be corrected upon review.

## 2017-12-12 NOTE — Telephone Encounter (Signed)
Printed avs and calender of upcoming appointments. Per 8/22 los . [place] for approval for treatment to be added on for 9/19. Logged in book

## 2017-12-12 NOTE — Progress Notes (Signed)
DISCONTINUE OFF PATHWAY REGIMEN - Non-Small Cell Lung   [id number]:Cisplatin 75 mg/m2 + Pemetrexed 500 mg/m2 q21 days:   A cycle is every 21 days:     Pemetrexed        Dose Mod: None     Cisplatin        Dose Mod: None  **Always confirm dose/schedule in your pharmacy ordering system**  REASON: Disease Progression PRIOR TREATMENT: Off Pathway: Cisplatin 75 mg/m2 + Pemetrexed 500 mg/m2 q21 days TREATMENT RESPONSE: N/A - Adjuvant Therapy  START ON PATHWAY REGIMEN - Non-Small Cell Lung     A cycle is every 21 days:     Pembrolizumab      Pemetrexed      Carboplatin   **Always confirm dose/schedule in your pharmacy ordering system**  Patient Characteristics: Stage IV Metastatic, Nonsquamous, Initial Chemotherapy/Immunotherapy, PS = 0, 1, ALK Translocation Negative/Unknown and EGFR Mutation Negative/Non-Sensitizing/Unknown, PD-L1 Expression Positive 1-49% (TPS) / Negative / Not Tested / Awaiting Test Results  and Immunotherapy Candidate AJCC T Category: T2a Current Disease Status: Distant Metastases AJCC N Category: N2 AJCC M Category: M1c AJCC 8 Stage Grouping: IVB Histology: Nonsquamous Cell ROS1 Rearrangement Status: Negative T790M Mutation Status: Not Applicable - EGFR Mutation Negative/Unknown Other Mutations/Biomarkers: No Other Actionable Mutations NTRK Gene Fusion Status: Negative PD-L1 Expression Status: PD-L1 Negative Chemotherapy/Immunotherapy LOT: Initial Chemotherapy/Immunotherapy Molecular Targeted Therapy: Not Appropriate ALK Translocation Status: Negative EGFR Mutation Status: Negative/Wild Type BRAF V600E Mutation Status: Negative Performance Status: PS = 0, 1 Immunotherapy Candidate Status: Candidate for Immunotherapy Intent of Therapy: Non-Curative / Palliative Intent, Discussed with Patient

## 2017-12-13 ENCOUNTER — Telehealth: Payer: Self-pay

## 2017-12-13 ENCOUNTER — Ambulatory Visit (INDEPENDENT_AMBULATORY_CARE_PROVIDER_SITE_OTHER): Payer: BLUE CROSS/BLUE SHIELD | Admitting: Cardiology

## 2017-12-13 ENCOUNTER — Encounter

## 2017-12-13 ENCOUNTER — Encounter: Payer: Self-pay | Admitting: Internal Medicine

## 2017-12-13 ENCOUNTER — Encounter: Payer: Self-pay | Admitting: Cardiology

## 2017-12-13 VITALS — BP 138/70 | HR 67 | Ht 75.0 in | Wt 153.4 lb

## 2017-12-13 DIAGNOSIS — I10 Essential (primary) hypertension: Secondary | ICD-10-CM

## 2017-12-13 DIAGNOSIS — I714 Abdominal aortic aneurysm, without rupture, unspecified: Secondary | ICD-10-CM

## 2017-12-13 DIAGNOSIS — C3432 Malignant neoplasm of lower lobe, left bronchus or lung: Secondary | ICD-10-CM | POA: Diagnosis not present

## 2017-12-13 DIAGNOSIS — I340 Nonrheumatic mitral (valve) insufficiency: Secondary | ICD-10-CM | POA: Diagnosis not present

## 2017-12-13 NOTE — Telephone Encounter (Signed)
Spoke with patient to verify appointment time was added to his current scheduled for 9/19 (lab est, and now infusion). Per 8/22 los

## 2017-12-13 NOTE — Patient Instructions (Signed)
Medication Instructions:   Your physician recommends that you continue on your current medications as directed. Please refer to the Current Medication list given to you today.     Follow-Up:  WITH [name] ON March 26, 2018 AT 10:40 AM--PLEASE ARRIVE 15 MINUTES PRIOR TO THIS APPOINTMENT       If you need a refill on your cardiac medications before your next appointment, please call your pharmacy.

## 2017-12-13 NOTE — Progress Notes (Signed)
Cardiology Office Note    Date:  12/13/2017   ID:  William Kales [name], DOB 1956/03/14, MRN [id number]  PCP:  Default, Provider, MD  Cardiologist:  [name], MD   Chief complain: 1 year follow up  History of Present Illness:  William Stockman. is a 62 y.o. male , very pleasant patient with a history of of AAA s/p stent graft in 02/2016, 40 PPY history of tobacco abuse, diagnosed with a left lung mass on PFT and PET scan. 8 x 3.2 cm superior segment left lower lobe lung mass is hypermetabolic and consistent with primary lung neoplasm. No mediastinal or hilar lymphadenopathy and no findings to suggest metastatic disease. He underwent left lower lobectomy and wedge resection of the left upper lobe for a stage II a moderately differentiated adenocarcinoma of the lung adjuvant chemotherapy with cisplatin 75 MG/M2 and Alimta 500 MG/M2 every 3 weeks.  Malignant neoplasm of lower lobe of left lung (HCC) Staging form: Lung, AJCC 8th Edition - Clinical: Stage IIA (cT2b, cN0, cM0) - Signed by [name], MD on 05/15/2016 - Pathologic stage from 05/15/2016: Stage IIA (pT2b, pN0, cM0)  He was able to continue to work 16 hours a day [place], able to work manually all day without any symptoms of shortness of breath no chest pain no palpitation dizziness no syncope, however post lung lobectomy and chemotherapy he felt some mild shortness of breath. 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 He has completed chemotherapy and is currently in remission being followed by oncology.    12/13/2017 - 1 year follow up, the patient states that his lung cancer has recurred - B/L lung nodules consistent with mets and lesion in T4 on PET. He is starting chemo and radiation on 12/19/2017. He states that he has poor appetite. Continues to work, minimal and stable SOB, no palpitations, no dizziness, stable LE  edema sec to  Venous insufficiency, wears compression stockings. No claudications.    Past Medical History:  Diagnosis Date  . AAA (abdominal aortic aneurysm) ([place])   . AAA (abdominal aortic aneurysm) without rupture ([place]) 02/04/2014  . Cancer ([place])    lung  . Encounter for antineoplastic chemotherapy 06/06/2016  . History of hepatitis B   . HNP (herniated nucleus pulposus), lumbar    L4 with radiculopathy  . Hypertension   . Lung cancer ([place]) 05/18/2016  . Malignant neoplasm of lower lobe of left lung ([place]) 04/05/2016  . Mass of left lung   . Mitral insufficiency 04/06/2016   This patient will eventually need MV repair if his prognosis is good from oncology standpoint. However, his lung cancer therapy  is the priority right now. His cardiac condition won't preclude possible lung surgery.  Once his lung cancer is under control he will need a TEE to further evaluate his mitral valve anatomy and MR severity, and also have ischemic workup as tere is evidence on calcificati  . Renal insufficiency 10/09/2016  . S/P partial lobectomy of lung 05/11/2016  . Varicose veins of legs     Past Surgical History:  Procedure Laterality Date  . ABDOMINAL AORTIC ENDOVASCULAR STENT GRAFT N/A 03/12/2016   Procedure: ABDOMINAL AORTIC ENDOVASCULAR STENT GRAFT;  Surgeon: Conrad Colville, MD;  Location: [facility];  Service: Vascular;  Laterality: N/A;  . COLONOSCOPY    . INGUINAL HERNIA  REPAIR Left 1975   Left inguinal hernia  . INGUINAL HERNIA REPAIR Right   . LOBECTOMY Left 05/11/2016   Procedure: LEFT LOWER LOBE LOBECTOMY AND LEFT UPPER LOBE  RESECTION AND PLACEMENT OF ON-Q;  Surgeon: [name], MD;  Location: [facility];  Service: Thoracic;  Laterality: Left;  . LUMBAR LAMINECTOMY  October 22, 2012  . LYMPH NODE DISSECTION Left 05/11/2016   Procedure: LYMPH NODE DISSECTION;  Surgeon: [name], MD;  Location: [facility];  Service: Thoracic;  Laterality: Left;  . STAPLING OF BLEBS Left 05/11/2016   Procedure:  STAPLING OF APICAL BLEB;  Surgeon: [name], MD;  Location: [facility];  Service: Thoracic;  Laterality: Left;  Marland Kitchen VIDEO ASSISTED THORACOSCOPY Left 05/18/2016   Procedure: VIDEO ASSISTED THORACOSCOPY WITH REMOVAL OF LEFT APICAL BLEB;  Surgeon: [name], MD;  Location: [facility];  Service: Thoracic;  Laterality: Left;  Marland Kitchen VIDEO ASSISTED THORACOSCOPY (VATS)/WEDGE RESECTION Left 05/11/2016   Procedure: LEFT VIDEO ASSISTED THORACOSCOPY (VATS);  Surgeon: [name], MD;  Location: [facility];  Service: Thoracic;  Laterality: Left;  Marland Kitchen VIDEO BRONCHOSCOPY N/A 05/11/2016   Procedure: VIDEO BRONCHOSCOPY, LEFT LUNG;  Surgeon: [name], MD;  Location: [facility];  Service: Thoracic;  Laterality: N/A;  . VIDEO BRONCHOSCOPY N/A 05/18/2016   Procedure: VIDEO BRONCHOSCOPY WITH BRONCHIAL WASHING;  Surgeon: [name], MD;  Location: [facility];  Service: Thoracic;  Laterality: N/A;    Current Medications: Outpatient Medications Prior to Visit  Medication Sig Dispense Refill  . folic acid (FOLVITE) 1 MG tablet Take 1 tablet (1 mg total) by mouth daily. 30 tablet 4  . losartan (COZAAR) 25 MG tablet Take 1 tablet (25 mg total) by mouth daily. 90 tablet 3  . prochlorperazine (COMPAZINE) 10 MG tablet Take 1 tablet (10 mg total) by mouth every 6 (six) hours as needed for nausea or vomiting. 60 tablet 0   No facility-administered medications prior to visit.      Allergies:   Tramadol   Social History   Socioeconomic History  . Marital status: Married    Spouse name: Not on file  . Number of children: Not on file  . Years of education: Not on file  . Highest education level: Not on file  Occupational History  . Not on file  Social Needs  . Financial resource strain: Not on file  . Food insecurity:    Worry: Not on file    Inability: Not on file  . Transportation needs:    Medical: Not on file    Non-medical: Not on file  Tobacco Use  . Smoking status: Former Smoker    Packs/day: 1.00     Types: Cigarettes    Last attempt to quit: 03/05/2016    Years since quitting: 1.7  . Smokeless tobacco: Never Used  Substance and Sexual Activity  . Alcohol use: Yes    Alcohol/week: 6.0 standard drinks    Types: 6 Cans of beer per week  . 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 Sexual activity: Not on file  Lifestyle  . Physical activity:    Days per week: Not on file    Minutes per session: Not on file  . Stress: Not on file  Relationships  . Social connections:    Talks on phone: Not on file    Gets together: Not on file    Attends religious  service: Not on file    Active member of club or organization: Not on file    Attends meetings of clubs or organizations: Not on file    Relationship status: Not on file  Other Topics Concern  . Not on file  Social History Narrative  . Not on file     Family History:  The patient's family history includes Diabetes in his mother.   ROS:   Please see the history of present illness.    ROS All other systems reviewed and are negative.   PHYSICAL EXAM:   VS:  BP 138/70   Pulse 67   Ht 6\' 3"  (1.905 m)   Wt 153 lb 6.4 oz (69.6 kg)   SpO2 98%   BMI 19.17 kg/m    GEN: Well nourished, well developed, in no acute distress  HEENT: normal  Neck: no JVD, carotid bruits, or masses Cardiac: RRR; 4/6 systolic murmurs, rubs, or gallops,no edema  Respiratory:  clear to auscultation bilaterally, normal work of breathing GI: soft, nontender, nondistended, + BS MS: no deformity or atrophy  Skin: warm and dry, no rash Neuro:  Alert and Oriented x 3, Strength and sensation are intact Psych: euthymic mood, full affect  Wt Readings from Last 3 Encounters:  12/13/17 153 lb 6.4 oz (69.6 kg)  12/12/17 152 lb 6.4 oz (69.1 kg)  11/21/17 155 lb 1.6 oz (70.4 kg)    Studies/Labs Reviewed:   EKG:  EKG is ordered today.  It shows normal sinus rhythm with LVH, early repolarization unchanged from prior.  Personally reviewed.  Recent Labs: 01/11/2017: Magnesium 2.2 10/23/2017: ALT 20 12/02/2017: BUN 15; Creatinine, Ser 1.30; Hemoglobin 14.0; Platelets 382; Potassium 3.9; Sodium 136   Lipid Panel No results found for: CHOL, TRIG, HDL, CHOLHDL, VLDL, LDLCALC, LDLDIRECT  Additional studies/ records that were reviewed today include:   TTE: 01/01/2017 - Left ventricle: The cavity size was normal. Systolic function was   normal. The estimated ejection fraction was in the range of 60%   to 65%. Wall motion was normal; there were no regional wall   motion abnormalities. - Aorta: Aortic root dimension: 46 mm (ED). - Ascending aorta: The ascending aorta was moderately dilated. - Mitral valve: Severe thickening of the anterior leaflet. Severe   prolapse, involving the anterior leaflet. There was moderate to   severe regurgitation directed eccentrically and posteriorly. - Left atrium: The atrium was mildly dilated. Volume/bsa, ES,   (1-plane Simpson&'s, A2C): 32.7 ml/m^2. - Tricuspid valve: Moderate leaflet thickening. - Pulmonary arteries: PA peak pressure: 32 mm Hg (S).  Impressions: - Compared to the prior study, there has been no significant   interval change.  TTE: 11/26/2017 - Left ventricle: The cavity size was normal. There was mild   concentric hypertrophy. Systolic function was normal. The   estimated ejection fraction was in the range of 55% to 60%. Wall   motion was normal; there were no regional wall motion   abnormalities. The study is not technically sufficient to allow   evaluation of LV diastolic function. - Aortic valve: There was no regurgitation. - Aorta: Aortic root dimension: 48 mm (ED). Ascending aortic   diameter: 41 mm (S). - Aortic root: The aortic root was moderately dilated. - Mitral valve: Severe prolapse, involving the anterior leaflet.   There was mild contractile dysfunction of the papillary muscles,   with abnormal movement of the posteromedial muscle on  parasternal   short axis. There was a solid, mobile mass on the  left   ventricular aspect of the tip of the anterior leaflet. Etiology   may be myxomatous degeneration, small section of papillary   muscle, or other. Appears similar to prior study. There was   moderate to severe regurgitation directed eccentrically and   posteriorly. - Left atrium: The atrium was severely dilated. - Tricuspid valve: There was moderate regurgitation. - Pulmonic valve: There was trivial regurgitation. - Pulmonary arteries: PA peak pressure: 42 mm Hg (S).  Impressions:  - Prolapse of the anterior mitral valve leaflet with moderate to   severe eccentric regurgitation. Leaflet is thickened, appears to   have mass on ventricular side, may be small section of papillary   muscle vs. other etiology (degeneration, vegetation, etc).   Recommend TEE for further evaluation if clinically warranted.  Cardiopulmonary exercise test.  January 2018 Conclusion:Exercise testing with gas exchange demonstrates normal functional capacity when compared to matched sedentary norms. Patient is primarily ventilatory limited and is clearly of obstructive nature. VE/VCO2 is elevated and indicates significant dead space ventilation most likely due to obstructive lung disease. Patient's measured VO63max is low-normal at 22.1 ml/kg/min and indicates relatively little increased risk associated with lung resection.   ASSESSMENT:    1. Severe mitral regurgitation   2. AAA (abdominal aortic aneurysm) without rupture ([place])   3. Malignant neoplasm of lower lobe of left lung ([place])   4. Essential hypertension    PLAN:  In order of problems listed above:  1.  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 LVEF 60-65%. RVSP 42 mmHg, previously 32 mmHg in August 2018. Minimal symptoms of SOB, stable, the  patient is undergoing chemo/radiation therapy for stage 4 lung cancer, we will follow in December 2019, if in remission we will plan for TEE and surgical consult.  2.  Ascending aortic aneurysm -followed by Dr. Servando Snare, stable 46 mm on most recent echo in September 2018.  Most recent CTA in 7/19 states stable, no measurements. There is no associated aortic regurgitation.  3.  Essential hypertension -evidence of moderate concentric LVH on echocardiogram and ascending aortic aneurysm, I will start losartan 25 mg daily.  4. Lung cancer -as above  Medication Adjustments/Labs and Tests Ordered: Current medicines are reviewed at length with the patient today.  Concerns regarding medicines are outlined above.  Medication changes, Labs and Tests ordered today are listed in the Patient Instructions below. Patient Instructions  Medication Instructions:   Your physician recommends that you continue on your current medications as directed. Please refer to the Current Medication list given to you today.     Follow-Up:  WITH [name] ON March 26, 2018 AT 10:40 AM--PLEASE ARRIVE 15 MINUTES PRIOR TO THIS APPOINTMENT       If you need a refill on your cardiac medications before your next appointment, please call your pharmacy.      Signed, [name], MD  12/13/2017 10:00 AM    [address], [place], [place]  [id number] Phone: [phone]; Fax: [phone]

## 2017-12-18 ENCOUNTER — Inpatient Hospital Stay: Payer: BLUE CROSS/BLUE SHIELD

## 2017-12-18 DIAGNOSIS — C3432 Malignant neoplasm of lower lobe, left bronchus or lung: Secondary | ICD-10-CM

## 2017-12-18 DIAGNOSIS — Z5111 Encounter for antineoplastic chemotherapy: Secondary | ICD-10-CM | POA: Diagnosis not present

## 2017-12-18 DIAGNOSIS — Z85118 Personal history of other malignant neoplasm of bronchus and lung: Secondary | ICD-10-CM | POA: Diagnosis not present

## 2017-12-18 DIAGNOSIS — R918 Other nonspecific abnormal finding of lung field: Secondary | ICD-10-CM | POA: Diagnosis not present

## 2017-12-18 DIAGNOSIS — Z5112 Encounter for antineoplastic immunotherapy: Secondary | ICD-10-CM | POA: Diagnosis not present

## 2017-12-18 DIAGNOSIS — I10 Essential (primary) hypertension: Secondary | ICD-10-CM | POA: Diagnosis not present

## 2017-12-18 DIAGNOSIS — Z9221 Personal history of antineoplastic chemotherapy: Secondary | ICD-10-CM | POA: Diagnosis not present

## 2017-12-18 DIAGNOSIS — C3431 Malignant neoplasm of lower lobe, right bronchus or lung: Secondary | ICD-10-CM | POA: Diagnosis not present

## 2017-12-18 DIAGNOSIS — C3412 Malignant neoplasm of upper lobe, left bronchus or lung: Secondary | ICD-10-CM | POA: Diagnosis not present

## 2017-12-18 LAB — CMP (CANCER CENTER ONLY)
ALT: 16 U/L (ref 0–44)
AST: 28 U/L (ref 15–41)
Albumin: 4.1 g/dL (ref 3.5–5.0)
Alkaline Phosphatase: 117 U/L (ref 38–126)
Anion gap: 8 (ref 5–15)
BILIRUBIN TOTAL: 0.4 mg/dL (ref 0.3–1.2)
BUN: 14 mg/dL (ref 8–23)
CO2: 24 mmol/L (ref 22–32)
Calcium: 9.6 mg/dL (ref 8.9–10.3)
Chloride: 103 mmol/L (ref 98–111)
Creatinine: 1.29 mg/dL — ABNORMAL HIGH (ref 0.61–1.24)
GFR, EST NON AFRICAN AMERICAN: 57 mL/min — AB (ref >60)
Glucose, Bld: 105 mg/dL — ABNORMAL HIGH (ref 70–99)
POTASSIUM: 4.5 mmol/L (ref 3.5–5.1)
Sodium: 135 mmol/L (ref 135–145)
TOTAL PROTEIN: 7.7 g/dL (ref 6.5–8.1)

## 2017-12-18 LAB — CBC WITH DIFFERENTIAL (CANCER CENTER ONLY)
BASOS ABS: 0.1 10*3/uL (ref 0.0–0.1)
Basophils Relative: 1 %
Eosinophils Absolute: 0.4 10*3/uL (ref 0.0–0.5)
Eosinophils Relative: 4 %
HEMATOCRIT: 39.7 % (ref 38.4–49.9)
Hemoglobin: 13.1 g/dL (ref 13.0–17.1)
LYMPHS ABS: 1.3 10*3/uL (ref 0.9–3.3)
LYMPHS PCT: 16 %
MCH: 29.9 pg (ref 27.2–33.4)
MCHC: 33 g/dL (ref 32.0–36.0)
MCV: 90.5 fL (ref 79.3–98.0)
MONO ABS: 0.9 10*3/uL (ref 0.1–0.9)
MONOS PCT: 11 %
NEUTROS ABS: 5.7 10*3/uL (ref 1.5–6.5)
Neutrophils Relative %: 68 %
Platelet Count: 337 10*3/uL (ref 140–400)
RBC: 4.39 MIL/uL (ref 4.20–5.82)
RDW: 13.8 % (ref 11.0–14.6)
WBC Count: 8.4 10*3/uL (ref 4.0–10.3)

## 2017-12-18 LAB — TSH: TSH: 1.069 u[IU]/mL (ref 0.320–4.118)

## 2017-12-19 ENCOUNTER — Inpatient Hospital Stay: Payer: BLUE CROSS/BLUE SHIELD

## 2017-12-19 VITALS — BP 152/88 | HR 57 | Temp 97.6°F | Resp 18

## 2017-12-19 DIAGNOSIS — I10 Essential (primary) hypertension: Secondary | ICD-10-CM | POA: Diagnosis not present

## 2017-12-19 DIAGNOSIS — C3431 Malignant neoplasm of lower lobe, right bronchus or lung: Secondary | ICD-10-CM | POA: Diagnosis not present

## 2017-12-19 DIAGNOSIS — Z9221 Personal history of antineoplastic chemotherapy: Secondary | ICD-10-CM | POA: Diagnosis not present

## 2017-12-19 DIAGNOSIS — Z5111 Encounter for antineoplastic chemotherapy: Secondary | ICD-10-CM | POA: Diagnosis not present

## 2017-12-19 DIAGNOSIS — C3432 Malignant neoplasm of lower lobe, left bronchus or lung: Secondary | ICD-10-CM | POA: Diagnosis not present

## 2017-12-19 DIAGNOSIS — R918 Other nonspecific abnormal finding of lung field: Secondary | ICD-10-CM | POA: Diagnosis not present

## 2017-12-19 DIAGNOSIS — Z5112 Encounter for antineoplastic immunotherapy: Secondary | ICD-10-CM | POA: Diagnosis not present

## 2017-12-19 DIAGNOSIS — C3492 Malignant neoplasm of unspecified part of left bronchus or lung: Secondary | ICD-10-CM

## 2017-12-19 DIAGNOSIS — Z85118 Personal history of other malignant neoplasm of bronchus and lung: Secondary | ICD-10-CM | POA: Diagnosis not present

## 2017-12-19 DIAGNOSIS — C3412 Malignant neoplasm of upper lobe, left bronchus or lung: Secondary | ICD-10-CM | POA: Diagnosis not present

## 2017-12-19 MED ORDER — SODIUM CHLORIDE 0.9 % IV SOLN
405.5000 mg | Freq: Once | INTRAVENOUS | Status: AC
  Administered 2017-12-19: 410 mg via INTRAVENOUS
  Filled 2017-12-19: qty 41

## 2017-12-19 MED ORDER — SODIUM CHLORIDE 0.9 % IV SOLN
520.0000 mg/m2 | Freq: Once | INTRAVENOUS | Status: AC
  Administered 2017-12-19: 1000 mg via INTRAVENOUS
  Filled 2017-12-19: qty 40

## 2017-12-19 MED ORDER — SODIUM CHLORIDE 0.9 % IV SOLN
Freq: Once | INTRAVENOUS | Status: AC
  Administered 2017-12-19: 09:00:00 via INTRAVENOUS
  Filled 2017-12-19: qty 5

## 2017-12-19 MED ORDER — PALONOSETRON HCL INJECTION 0.25 MG/5ML
0.2500 mg | Freq: Once | INTRAVENOUS | Status: AC
  Administered 2017-12-19: 0.25 mg via INTRAVENOUS

## 2017-12-19 MED ORDER — PALONOSETRON HCL INJECTION 0.25 MG/5ML
INTRAVENOUS | Status: AC
  Filled 2017-12-19: qty 5

## 2017-12-19 MED ORDER — SODIUM CHLORIDE 0.9 % IV SOLN
200.0000 mg | Freq: Once | INTRAVENOUS | Status: AC
  Administered 2017-12-19: 200 mg via INTRAVENOUS
  Filled 2017-12-19: qty 8

## 2017-12-19 MED ORDER — SODIUM CHLORIDE 0.9 % IV SOLN
Freq: Once | INTRAVENOUS | Status: AC
  Administered 2017-12-19: 08:00:00 via INTRAVENOUS
  Filled 2017-12-19: qty 250

## 2017-12-19 NOTE — Patient Instructions (Signed)
[facility] Discharge Instructions for Patients Receiving Chemotherapy  Today you received the following chemotherapy agents: Keytruda, Alimta, Carboplatin  To help prevent nausea and vomiting after your treatment, we encourage you to take your nausea medication as directed.   If you develop nausea and vomiting that is not controlled by your nausea medication, call the clinic.   BELOW ARE SYMPTOMS THAT SHOULD BE REPORTED IMMEDIATELY:  *FEVER GREATER THAN 100.5 F  *CHILLS WITH OR WITHOUT FEVER  NAUSEA AND VOMITING THAT IS NOT CONTROLLED WITH YOUR NAUSEA MEDICATION  *UNUSUAL SHORTNESS OF BREATH  *UNUSUAL BRUISING OR BLEEDING  TENDERNESS IN MOUTH AND THROAT WITH OR WITHOUT PRESENCE OF ULCERS  *URINARY PROBLEMS  *BOWEL PROBLEMS  UNUSUAL RASH Items with * indicate a potential emergency and should be followed up as soon as possible.  Feel free to call the clinic should you have any questions or concerns. The clinic phone number is [phone].  Please show the [address] at check-in to the Emergency Department and triage nurse.  Pembrolizumab injection What is this medicine? PEMBROLIZUMAB (pem broe liz ue mab) is a monoclonal antibody. It is used to treat melanoma, head and neck cancer, Hodgkin lymphoma, non-small cell lung cancer, urothelial cancer, stomach cancer, and cancers that have a certain genetic condition. This medicine may be used for other purposes; ask your health care provider or pharmacist if you have questions. COMMON BRAND NAME(S): Keytruda What should I tell my health care provider before I take this medicine? They need to know if you have any of these conditions: -diabetes -immune system problems -inflammatory bowel disease -liver disease -lung or breathing disease -lupus -organ transplant -an unusual or allergic reaction to pembrolizumab, other medicines, foods, dyes, or preservatives -pregnant or trying to get  pregnant -breast-feeding How should I use this medicine? This medicine is for infusion into a vein. It is given by a health care professional in a hospital or clinic setting. A special MedGuide will be given to you before each treatment. Be sure to read this information carefully each time. Talk to your pediatrician regarding the use of this medicine in children. While this drug may be prescribed for selected conditions, precautions do apply. Overdosage: If you think you have taken too much of this medicine contact a poison control center or emergency room at once. NOTE: This medicine is only for you. Do not share this medicine with others. What if I miss a dose? It is important not to miss your dose. Call your doctor or health care professional if you are unable to keep an appointment. What may interact with this medicine? Interactions have not been studied. Give your health care provider a list of all the medicines, herbs, non-prescription drugs, or dietary supplements you use. Also tell them if you smoke, drink alcohol, or use illegal drugs. Some items may interact with your medicine. This list may not describe all possible interactions. Give your health care provider a list of all the medicines, herbs, non-prescription drugs, or dietary supplements you use. Also tell them if you smoke, drink alcohol, or use illegal drugs. Some items may interact with your medicine. What should I watch for while using this medicine? Your condition will be monitored carefully while you are receiving this medicine. You may need blood work done while you are taking this medicine. Do not become pregnant while taking this medicine or for 4 months after stopping it. Women should inform their doctor if they wish to become pregnant or think  they might be pregnant. There is a potential for serious side effects to an unborn child. Talk to your health care professional or pharmacist for more information. Do not breast-feed  an infant while taking this medicine or for 4 months after the last dose. What side effects may I notice from receiving this medicine? Side effects that you should report to your doctor or health care professional as soon as possible: -allergic reactions like skin rash, itching or hives, swelling of the face, lips, or tongue -bloody or black, tarry -breathing problems -changes in vision -chest pain -chills -constipation -cough -dizziness or feeling faint or lightheaded -fast or irregular heartbeat -fever -flushing -hair loss -low blood counts - this medicine may decrease the number of white blood cells, red blood cells and platelets. You may be at increased risk for infections and bleeding. -muscle pain -muscle weakness -persistent headache -signs and symptoms of high blood sugar such as dizziness; dry mouth; dry skin; fruity breath; nausea; stomach pain; increased hunger or thirst; increased urination -signs and symptoms of kidney injury like trouble passing urine or change in the amount of urine -signs and symptoms of liver injury like dark urine, light-colored stools, loss of appetite, nausea, right upper belly pain, yellowing of the eyes or skin -stomach pain -sweating -weight loss Side effects that usually do not require medical attention (report to your doctor or health care professional if they continue or are bothersome): -decreased appetite -diarrhea -tiredness This list may not describe all possible side effects. Call your doctor for medical advice about side effects. You may report side effects to FDA at 1-800-FDA-1088. Where should I keep my medicine? This drug is given in a hospital or clinic and will not be stored at home. NOTE: This sheet is a summary. It may not cover all possible information. If you have questions about this medicine, talk to your doctor, pharmacist, or health care provider.  2018 Elsevier/Gold Standard (2016-01-17 12:29:36)  Pemetrexed  injection What is this medicine? PEMETREXED (PEM e TREX ed) is a chemotherapy drug used to treat lung cancers like non-small cell lung cancer and mesothelioma. It may also be used to treat other cancers. This medicine may be used for other purposes; ask your health care provider or pharmacist if you have questions. COMMON BRAND NAME(S): Alimta What should I tell my health care provider before I take this medicine? They need to know if you have any of these conditions: -infection (especially a virus infection such as chickenpox, cold sores, or herpes) -kidney disease -low blood counts, like low white cell, platelet, or red cell counts -lung or breathing disease, like asthma -radiation therapy -an unusual or allergic reaction to pemetrexed, other medicines, foods, dyes, or preservative -pregnant or trying to get pregnant -breast-feeding How should I use this medicine? This drug is given as an infusion into a vein. It is administered in a hospital or clinic by a specially trained health care professional. Talk to your pediatrician regarding the use of this medicine in children. Special care may be needed. Overdosage: If you think you have taken too much of this medicine contact a poison control center or emergency room at once. NOTE: This medicine is only for you. Do not share this medicine with others. What if I miss a dose? It is important not to miss your dose. Call your doctor or health care professional if you are unable to keep an appointment. What may interact with this medicine? This medicine may interact with the following medications: -Ibuprofen  This list may not describe all possible interactions. Give your health care provider a list of all the medicines, herbs, non-prescription drugs, or dietary supplements you use. Also tell them if you smoke, drink alcohol, or use illegal drugs. Some items may interact with your medicine. What should I watch for while using this medicine? Visit  your doctor for checks on your progress. This drug may make you feel generally unwell. This is not uncommon, as chemotherapy can affect healthy cells as well as cancer cells. Report any side effects. Continue your course of treatment even though you feel ill unless your doctor tells you to stop. In some cases, you may be given additional medicines to help with side effects. Follow all directions for their use. Call your doctor or health care professional for advice if you get a fever, chills or sore throat, or other symptoms of a cold or flu. Do not treat yourself. This drug decreases your body's ability to fight infections. Try to avoid being around people who are sick. This medicine may increase your risk to bruise or bleed. Call your doctor or health care professional if you notice any unusual bleeding. Be careful brushing and flossing your teeth or using a toothpick because you may get an infection or bleed more easily. If you have any dental work done, tell your dentist you are receiving this medicine. Avoid taking products that contain aspirin, acetaminophen, ibuprofen, naproxen, or ketoprofen unless instructed by your doctor. These medicines may hide a fever. Call your doctor or health care professional if you get diarrhea or mouth sores. Do not treat yourself. To protect your kidneys, drink water or other fluids as directed while you are taking this medicine. Do not become pregnant while taking this medicine or for 6 months after stopping it. Women should inform their doctor if they wish to become pregnant or think they might be pregnant. Men should not father a child while taking this medicine and for 3 months after stopping it. This may interfere with the ability to father a child. You should talk to your doctor or health care professional if you are concerned about your fertility. There is a potential for serious side effects to an unborn child. Talk to your health care professional or pharmacist  for more information. Do not breast-feed an infant while taking this medicine or for 1 week after stopping it. What side effects may I notice from receiving this medicine? Side effects that you should report to your doctor or health care professional as soon as possible: -allergic reactions like skin rash, itching or hives, swelling of the face, lips, or tongue -breathing problems -redness, blistering, peeling or loosening of the skin, including inside the mouth -signs and symptoms of bleeding such as bloody or black, tarry stools; red or dark-brown urine; spitting up blood or brown material that looks like coffee grounds; red spots on the skin; unusual bruising or bleeding from the eye, gums, or nose -signs and symptoms of infection like fever or chills; cough; sore throat; pain or trouble passing urine -signs and symptoms of kidney injury like trouble passing urine or change in the amount of urine -signs and symptoms of liver injury like dark yellow or brown urine; general ill feeling or flu-like symptoms; light-colored stools; loss of appetite; nausea; right upper belly pain; unusually weak or tired; yellowing of the eyes or skin Side effects that usually do not require medical attention (report to your doctor or health care professional if they continue or are  bothersome): -constipation -dizziness -mouth sores -nausea, vomiting -pain, tingling, numbness in the hands or feet -unusually weak or tired This list may not describe all possible side effects. Call your doctor for medical advice about side effects. You may report side effects to FDA at 1-800-FDA-1088. Where should I keep my medicine? This drug is given in a hospital or clinic and will not be stored at home. NOTE: This sheet is a summary. It may not cover all possible information. If you have questions about this medicine, talk to your doctor, pharmacist, or health care provider.  2018 Elsevier/Gold Standard (2016-02-07  18:51:46)  Carboplatin injection What is this medicine? CARBOPLATIN (KAR boe pla tin) is a chemotherapy drug. It targets fast dividing cells, like cancer cells, and causes these cells to die. This medicine is used to treat ovarian cancer and many other cancers. This medicine may be used for other purposes; ask your health care provider or pharmacist if you have questions. COMMON BRAND NAME(S): Paraplatin What should I tell my health care provider before I take this medicine? They need to know if you have any of these conditions: -blood disorders -hearing problems -kidney disease -recent or ongoing radiation therapy -an unusual or allergic reaction to carboplatin, cisplatin, other chemotherapy, other medicines, foods, dyes, or preservatives -pregnant or trying to get pregnant -breast-feeding How should I use this medicine? This drug is usually given as an infusion into a vein. It is administered in a hospital or clinic by a specially trained health care professional. Talk to your pediatrician regarding the use of this medicine in children. Special care may be needed. Overdosage: If you think you have taken too much of this medicine contact a poison control center or emergency room at once. NOTE: This medicine is only for you. Do not share this medicine with others. What if I miss a dose? It is important not to miss a dose. Call your doctor or health care professional if you are unable to keep an appointment. What may interact with this medicine? -medicines for seizures -medicines to increase blood counts like filgrastim, pegfilgrastim, sargramostim -some antibiotics like amikacin, gentamicin, neomycin, streptomycin, tobramycin -vaccines Talk to your doctor or health care professional before taking any of these medicines: -acetaminophen -aspirin -ibuprofen -ketoprofen -naproxen This list may not describe all possible interactions. Give your health care provider a list of all the  medicines, herbs, non-prescription drugs, or dietary supplements you use. Also tell them if you smoke, drink alcohol, or use illegal drugs. Some items may interact with your medicine. What should I watch for while using this medicine? Your condition will be monitored carefully while you are receiving this medicine. You will need important blood work done while you are taking this medicine. This drug may make you feel generally unwell. This is not uncommon, as chemotherapy can affect healthy cells as well as cancer cells. Report any side effects. Continue your course of treatment even though you feel ill unless your doctor tells you to stop. In some cases, you may be given additional medicines to help with side effects. Follow all directions for their use. Call your doctor or health care professional for advice if you get a fever, chills or sore throat, or other symptoms of a cold or flu. Do not treat yourself. This drug decreases your body's ability to fight infections. Try to avoid being around people who are sick. This medicine may increase your risk to bruise or bleed. Call your doctor or health care professional if you notice  any unusual bleeding. Be careful brushing and flossing your teeth or using a toothpick because you may get an infection or bleed more easily. If you have any dental work done, tell your dentist you are receiving this medicine. Avoid taking products that contain aspirin, acetaminophen, ibuprofen, naproxen, or ketoprofen unless instructed by your doctor. These medicines may hide a fever. Do not become pregnant while taking this medicine. Women should inform their doctor if they wish to become pregnant or think they might be pregnant. There is a potential for serious side effects to an unborn child. Talk to your health care professional or pharmacist for more information. Do not breast-feed an infant while taking this medicine. What side effects may I notice from receiving this  medicine? Side effects that you should report to your doctor or health care professional as soon as possible: -allergic reactions like skin rash, itching or hives, swelling of the face, lips, or tongue -signs of infection - fever or chills, cough, sore throat, pain or difficulty passing urine -signs of decreased platelets or bleeding - bruising, pinpoint red spots on the skin, black, tarry stools, nosebleeds -signs of decreased red blood cells - unusually weak or tired, fainting spells, lightheadedness -breathing problems -changes in hearing -changes in vision -chest pain -high blood pressure -low blood counts - This drug may decrease the number of white blood cells, red blood cells and platelets. You may be at increased risk for infections and bleeding. -nausea and vomiting -pain, swelling, redness or irritation at the injection site -pain, tingling, numbness in the hands or feet -problems with balance, talking, walking -trouble passing urine or change in the amount of urine Side effects that usually do not require medical attention (report to your doctor or health care professional if they continue or are bothersome): -hair loss -loss of appetite -metallic taste in the mouth or changes in taste This list may not describe all possible side effects. Call your doctor for medical advice about side effects. You may report side effects to FDA at 1-800-FDA-1088. Where should I keep my medicine? This drug is given in a hospital or clinic and will not be stored at home. NOTE: This sheet is a summary. It may not cover all possible information. If you have questions about this medicine, talk to your doctor, pharmacist, or health care provider.  2018 Elsevier/Gold Standard (2007-07-15 14:38:05)

## 2017-12-24 ENCOUNTER — Telehealth: Payer: Self-pay | Admitting: Medical Oncology

## 2017-12-24 NOTE — Telephone Encounter (Signed)
No answer

## 2017-12-26 ENCOUNTER — Inpatient Hospital Stay: Payer: BLUE CROSS/BLUE SHIELD | Attending: Internal Medicine

## 2017-12-26 ENCOUNTER — Ambulatory Visit (HOSPITAL_COMMUNITY)
Admission: RE | Admit: 2017-12-26 | Discharge: 2017-12-26 | Disposition: A | Discharge status: Home / Self Care | Payer: BLUE CROSS/BLUE SHIELD | Source: Ambulatory Visit | Attending: Internal Medicine | Admitting: Internal Medicine

## 2017-12-26 DIAGNOSIS — C3490 Malignant neoplasm of unspecified part of unspecified bronchus or lung: Secondary | ICD-10-CM

## 2017-12-26 DIAGNOSIS — C7951 Secondary malignant neoplasm of bone: Secondary | ICD-10-CM | POA: Insufficient documentation

## 2017-12-26 DIAGNOSIS — Z5111 Encounter for antineoplastic chemotherapy: Secondary | ICD-10-CM | POA: Diagnosis not present

## 2017-12-26 DIAGNOSIS — C3432 Malignant neoplasm of lower lobe, left bronchus or lung: Secondary | ICD-10-CM | POA: Diagnosis not present

## 2017-12-26 DIAGNOSIS — C3412 Malignant neoplasm of upper lobe, left bronchus or lung: Secondary | ICD-10-CM | POA: Insufficient documentation

## 2017-12-26 DIAGNOSIS — R9089 Other abnormal findings on diagnostic imaging of central nervous system: Secondary | ICD-10-CM | POA: Insufficient documentation

## 2017-12-26 DIAGNOSIS — K110 Atrophy of salivary gland: Secondary | ICD-10-CM | POA: Diagnosis not present

## 2017-12-26 DIAGNOSIS — Z5112 Encounter for antineoplastic immunotherapy: Secondary | ICD-10-CM | POA: Insufficient documentation

## 2017-12-26 DIAGNOSIS — Z85118 Personal history of other malignant neoplasm of bronchus and lung: Secondary | ICD-10-CM | POA: Diagnosis not present

## 2017-12-26 LAB — CBC WITH DIFFERENTIAL (CANCER CENTER ONLY)
BASOS PCT: 0 %
Basophils Absolute: 0 10*3/uL (ref 0.0–0.1)
Eosinophils Absolute: 0.3 10*3/uL (ref 0.0–0.5)
Eosinophils Relative: 7 %
HEMATOCRIT: 41.3 % (ref 38.4–49.9)
Hemoglobin: 13.5 g/dL (ref 13.0–17.1)
LYMPHS ABS: 0.9 10*3/uL (ref 0.9–3.3)
Lymphocytes Relative: 23 %
MCH: 29.8 pg (ref 27.2–33.4)
MCHC: 32.7 g/dL (ref 32.0–36.0)
MCV: 91.2 fL (ref 79.3–98.0)
MONO ABS: 0.1 10*3/uL (ref 0.1–0.9)
MONOS PCT: 3 %
Neutro Abs: 2.6 10*3/uL (ref 1.5–6.5)
Neutrophils Relative %: 67 %
Platelet Count: 193 10*3/uL (ref 140–400)
RBC: 4.53 MIL/uL (ref 4.20–5.82)
RDW: 13.3 % (ref 11.0–14.6)
WBC Count: 3.9 10*3/uL — ABNORMAL LOW (ref 4.0–10.3)

## 2017-12-26 LAB — CMP (CANCER CENTER ONLY)
ALBUMIN: 3.7 g/dL (ref 3.5–5.0)
ALT: 30 U/L (ref 0–44)
ANION GAP: 7 (ref 5–15)
AST: 31 U/L (ref 15–41)
Alkaline Phosphatase: 114 U/L (ref 38–126)
BUN: 25 mg/dL — ABNORMAL HIGH (ref 8–23)
CO2: 25 mmol/L (ref 22–32)
Calcium: 9.5 mg/dL (ref 8.9–10.3)
Chloride: 103 mmol/L (ref 98–111)
Creatinine: 1.5 mg/dL — ABNORMAL HIGH (ref 0.61–1.24)
GFR, EST AFRICAN AMERICAN: 55 mL/min — AB (ref >60)
GFR, EST NON AFRICAN AMERICAN: 47 mL/min — AB (ref >60)
Glucose, Bld: 69 mg/dL — ABNORMAL LOW (ref 70–99)
POTASSIUM: 4 mmol/L (ref 3.5–5.1)
Sodium: 135 mmol/L (ref 135–145)
TOTAL PROTEIN: 7.4 g/dL (ref 6.5–8.1)
Total Bilirubin: 0.5 mg/dL (ref 0.3–1.2)

## 2017-12-26 MED ORDER — GADOBENATE DIMEGLUMINE 529 MG/ML IV SOLN
15.0000 mL | Freq: Once | INTRAVENOUS | Status: AC | PRN
  Administered 2017-12-26: 15 mL via INTRAVENOUS

## 2017-12-27 NOTE — Progress Notes (Signed)
FMLA successfully faxed to [employer] at [facility] [employer] at [phone]. Mailed copy to patient address on file.

## 2018-01-02 ENCOUNTER — Inpatient Hospital Stay: Payer: BLUE CROSS/BLUE SHIELD

## 2018-01-02 DIAGNOSIS — C7951 Secondary malignant neoplasm of bone: Secondary | ICD-10-CM | POA: Diagnosis not present

## 2018-01-02 DIAGNOSIS — C3412 Malignant neoplasm of upper lobe, left bronchus or lung: Secondary | ICD-10-CM | POA: Diagnosis not present

## 2018-01-02 DIAGNOSIS — C3432 Malignant neoplasm of lower lobe, left bronchus or lung: Secondary | ICD-10-CM

## 2018-01-02 DIAGNOSIS — Z5112 Encounter for antineoplastic immunotherapy: Secondary | ICD-10-CM | POA: Diagnosis not present

## 2018-01-02 DIAGNOSIS — Z5111 Encounter for antineoplastic chemotherapy: Secondary | ICD-10-CM | POA: Diagnosis not present

## 2018-01-02 LAB — CBC WITH DIFFERENTIAL (CANCER CENTER ONLY)
Basophils Absolute: 0 10*3/uL (ref 0.0–0.1)
Basophils Relative: 0 %
EOS ABS: 0.3 10*3/uL (ref 0.0–0.5)
Eosinophils Relative: 6 %
HEMATOCRIT: 38.4 % (ref 38.4–49.9)
HEMOGLOBIN: 12.7 g/dL — AB (ref 13.0–17.1)
LYMPHS ABS: 1.2 10*3/uL (ref 0.9–3.3)
Lymphocytes Relative: 22 %
MCH: 29.8 pg (ref 27.2–33.4)
MCHC: 33.1 g/dL (ref 32.0–36.0)
MCV: 90.1 fL (ref 79.3–98.0)
MONO ABS: 0.8 10*3/uL (ref 0.1–0.9)
Monocytes Relative: 15 %
NEUTROS ABS: 3.2 10*3/uL (ref 1.5–6.5)
NEUTROS PCT: 57 %
Platelet Count: 211 10*3/uL (ref 140–400)
RBC: 4.26 MIL/uL (ref 4.20–5.82)
RDW: 13.2 % (ref 11.0–14.6)
WBC Count: 5.6 10*3/uL (ref 4.0–10.3)

## 2018-01-02 LAB — CMP (CANCER CENTER ONLY)
ALK PHOS: 115 U/L (ref 38–126)
ALT: 23 U/L (ref 0–44)
AST: 24 U/L (ref 15–41)
Albumin: 3.7 g/dL (ref 3.5–5.0)
Anion gap: 7 (ref 5–15)
BILIRUBIN TOTAL: 0.3 mg/dL (ref 0.3–1.2)
BUN: 16 mg/dL (ref 8–23)
CALCIUM: 9.3 mg/dL (ref 8.9–10.3)
CO2: 24 mmol/L (ref 22–32)
Chloride: 104 mmol/L (ref 98–111)
Creatinine: 1.3 mg/dL — ABNORMAL HIGH (ref 0.61–1.24)
GFR, Estimated: 56 mL/min — ABNORMAL LOW (ref >60)
Glucose, Bld: 87 mg/dL (ref 70–99)
Potassium: 4.3 mmol/L (ref 3.5–5.1)
Sodium: 135 mmol/L (ref 135–145)
TOTAL PROTEIN: 7.3 g/dL (ref 6.5–8.1)

## 2018-01-09 ENCOUNTER — Inpatient Hospital Stay: Payer: BLUE CROSS/BLUE SHIELD

## 2018-01-09 ENCOUNTER — Telehealth: Payer: Self-pay | Admitting: Internal Medicine

## 2018-01-09 ENCOUNTER — Inpatient Hospital Stay (HOSPITAL_BASED_OUTPATIENT_CLINIC_OR_DEPARTMENT_OTHER): Payer: BLUE CROSS/BLUE SHIELD | Admitting: Internal Medicine

## 2018-01-09 ENCOUNTER — Encounter: Payer: Self-pay | Admitting: Internal Medicine

## 2018-01-09 VITALS — BP 140/72 | HR 67 | Temp 98.2°F | Resp 18 | Ht 75.0 in | Wt 151.5 lb

## 2018-01-09 DIAGNOSIS — C3492 Malignant neoplasm of unspecified part of left bronchus or lung: Secondary | ICD-10-CM

## 2018-01-09 DIAGNOSIS — Z5112 Encounter for antineoplastic immunotherapy: Secondary | ICD-10-CM | POA: Insufficient documentation

## 2018-01-09 DIAGNOSIS — C3412 Malignant neoplasm of upper lobe, left bronchus or lung: Secondary | ICD-10-CM

## 2018-01-09 DIAGNOSIS — Z5111 Encounter for antineoplastic chemotherapy: Secondary | ICD-10-CM

## 2018-01-09 DIAGNOSIS — C3432 Malignant neoplasm of lower lobe, left bronchus or lung: Secondary | ICD-10-CM

## 2018-01-09 DIAGNOSIS — C7951 Secondary malignant neoplasm of bone: Secondary | ICD-10-CM | POA: Diagnosis not present

## 2018-01-09 DIAGNOSIS — R21 Rash and other nonspecific skin eruption: Secondary | ICD-10-CM | POA: Diagnosis not present

## 2018-01-09 LAB — CMP (CANCER CENTER ONLY)
ALK PHOS: 117 U/L (ref 38–126)
ALT: 20 U/L (ref 0–44)
AST: 28 U/L (ref 15–41)
Albumin: 3.9 g/dL (ref 3.5–5.0)
Anion gap: 9 (ref 5–15)
BUN: 17 mg/dL (ref 8–23)
CALCIUM: 9.7 mg/dL (ref 8.9–10.3)
CHLORIDE: 106 mmol/L (ref 98–111)
CO2: 22 mmol/L (ref 22–32)
CREATININE: 1.32 mg/dL — AB (ref 0.61–1.24)
GFR, EST NON AFRICAN AMERICAN: 55 mL/min — AB (ref >60)
Glucose, Bld: 90 mg/dL (ref 70–99)
Potassium: 4.6 mmol/L (ref 3.5–5.1)
Sodium: 137 mmol/L (ref 135–145)
Total Bilirubin: 0.4 mg/dL (ref 0.3–1.2)
Total Protein: 7.7 g/dL (ref 6.5–8.1)

## 2018-01-09 LAB — CBC WITH DIFFERENTIAL (CANCER CENTER ONLY)
BASOS ABS: 0 10*3/uL (ref 0.0–0.1)
Basophils Relative: 1 %
Eosinophils Absolute: 0.2 10*3/uL (ref 0.0–0.5)
Eosinophils Relative: 4 %
HCT: 40.4 % (ref 38.4–49.9)
HEMOGLOBIN: 13.2 g/dL (ref 13.0–17.1)
LYMPHS ABS: 1.2 10*3/uL (ref 0.9–3.3)
LYMPHS PCT: 21 %
MCH: 29.5 pg (ref 27.2–33.4)
MCHC: 32.7 g/dL (ref 32.0–36.0)
MCV: 90.2 fL (ref 79.3–98.0)
Monocytes Absolute: 0.9 10*3/uL (ref 0.1–0.9)
Monocytes Relative: 16 %
NEUTROS ABS: 3.4 10*3/uL (ref 1.5–6.5)
NEUTROS PCT: 58 %
Platelet Count: 433 10*3/uL — ABNORMAL HIGH (ref 140–400)
RBC: 4.48 MIL/uL (ref 4.20–5.82)
RDW: 14 % (ref 11.0–14.6)
WBC: 5.8 10*3/uL (ref 4.0–10.3)

## 2018-01-09 LAB — TSH: TSH: 1.219 u[IU]/mL (ref 0.320–4.118)

## 2018-01-09 MED ORDER — SODIUM CHLORIDE 0.9 % IV SOLN
Freq: Once | INTRAVENOUS | Status: AC
  Administered 2018-01-09: 12:00:00 via INTRAVENOUS
  Filled 2018-01-09: qty 250

## 2018-01-09 MED ORDER — SODIUM CHLORIDE 0.9 % IV SOLN
520.0000 mg/m2 | Freq: Once | INTRAVENOUS | Status: AC
  Administered 2018-01-09: 1000 mg via INTRAVENOUS
  Filled 2018-01-09: qty 40

## 2018-01-09 MED ORDER — SODIUM CHLORIDE 0.9 % IV SOLN
Freq: Once | INTRAVENOUS | Status: AC
  Administered 2018-01-09: 12:00:00 via INTRAVENOUS
  Filled 2018-01-09: qty 5

## 2018-01-09 MED ORDER — SODIUM CHLORIDE 0.9 % IV SOLN
200.0000 mg | Freq: Once | INTRAVENOUS | Status: AC
  Administered 2018-01-09: 200 mg via INTRAVENOUS
  Filled 2018-01-09: qty 8

## 2018-01-09 MED ORDER — SODIUM CHLORIDE 0.9 % IV SOLN
401.5000 mg | Freq: Once | INTRAVENOUS | Status: AC
  Administered 2018-01-09: 400 mg via INTRAVENOUS
  Filled 2018-01-09: qty 40

## 2018-01-09 MED ORDER — PALONOSETRON HCL INJECTION 0.25 MG/5ML
0.2500 mg | Freq: Once | INTRAVENOUS | Status: AC
  Administered 2018-01-09: 0.25 mg via INTRAVENOUS

## 2018-01-09 MED ORDER — PALONOSETRON HCL INJECTION 0.25 MG/5ML
INTRAVENOUS | Status: AC
  Filled 2018-01-09: qty 5

## 2018-01-09 NOTE — Telephone Encounter (Signed)
Scheduled appt per 9/19 los - gave pt AVS and calender per los.

## 2018-01-09 NOTE — Patient Instructions (Signed)
[facility] Discharge Instructions for Patients Receiving Chemotherapy  Today you received the following chemotherapy agents: Keytruda, Alimta, Carboplatin  To help prevent nausea and vomiting after your treatment, we encourage you to take your nausea medication as directed.   If you develop nausea and vomiting that is not controlled by your nausea medication, call the clinic.   BELOW ARE SYMPTOMS THAT SHOULD BE REPORTED IMMEDIATELY:  *FEVER GREATER THAN 100.5 F  *CHILLS WITH OR WITHOUT FEVER  NAUSEA AND VOMITING THAT IS NOT CONTROLLED WITH YOUR NAUSEA MEDICATION  *UNUSUAL SHORTNESS OF BREATH  *UNUSUAL BRUISING OR BLEEDING  TENDERNESS IN MOUTH AND THROAT WITH OR WITHOUT PRESENCE OF ULCERS  *URINARY PROBLEMS  *BOWEL PROBLEMS  UNUSUAL RASH Items with * indicate a potential emergency and should be followed up as soon as possible.  Feel free to call the clinic should you have any questions or concerns. The clinic phone number is [phone].  Please show the [facility] at check-in to the Emergency Department and triage nurse.  Pembrolizumab injection What is this medicine? PEMBROLIZUMAB (pem broe liz ue mab) is a monoclonal antibody. It is used to treat melanoma, head and neck cancer, Hodgkin lymphoma, non-small cell lung cancer, urothelial cancer, stomach cancer, and cancers that have a certain genetic condition. This medicine may be used for other purposes; ask your health care provider or pharmacist if you have questions. COMMON BRAND NAME(S): Keytruda What should I tell my health care provider before I take this medicine? They need to know if you have any of these conditions: -diabetes -immune system problems -inflammatory bowel disease -liver disease -lung or breathing disease -lupus -organ transplant -an unusual or allergic reaction to pembrolizumab, other medicines, foods, dyes, or preservatives -pregnant or trying to get  pregnant -breast-feeding How should I use this medicine? This medicine is for infusion into a vein. It is given by a health care professional in a hospital or clinic setting. A special MedGuide will be given to you before each treatment. Be sure to read this information carefully each time. Talk to your pediatrician regarding the use of this medicine in children. While this drug may be prescribed for selected conditions, precautions do apply. Overdosage: If you think you have taken too much of this medicine contact a poison control center or emergency room at once. NOTE: This medicine is only for you. Do not share this medicine with others. What if I miss a dose? It is important not to miss your dose. Call your doctor or health care professional if you are unable to keep an appointment. What may interact with this medicine? Interactions have not been studied. Give your health care provider a list of all the medicines, herbs, non-prescription drugs, or dietary supplements you use. Also tell them if you smoke, drink alcohol, or use illegal drugs. Some items may interact with your medicine. This list may not describe all possible interactions. Give your health care provider a list of all the medicines, herbs, non-prescription drugs, or dietary supplements you use. Also tell them if you smoke, drink alcohol, or use illegal drugs. Some items may interact with your medicine. What should I watch for while using this medicine? Your condition will be monitored carefully while you are receiving this medicine. You may need blood work done while you are taking this medicine. Do not become pregnant while taking this medicine or for 4 months after stopping it. Women should inform their doctor if they wish to become pregnant or think  they might be pregnant. There is a potential for serious side effects to an unborn child. Talk to your health care professional or pharmacist for more information. Do not breast-feed  an infant while taking this medicine or for 4 months after the last dose. What side effects may I notice from receiving this medicine? Side effects that you should report to your doctor or health care professional as soon as possible: -allergic reactions like skin rash, itching or hives, swelling of the face, lips, or tongue -bloody or black, tarry -breathing problems -changes in vision -chest pain -chills -constipation -cough -dizziness or feeling faint or lightheaded -fast or irregular heartbeat -fever -flushing -hair loss -low blood counts - this medicine may decrease the number of white blood cells, red blood cells and platelets. You may be at increased risk for infections and bleeding. -muscle pain -muscle weakness -persistent headache -signs and symptoms of high blood sugar such as dizziness; dry mouth; dry skin; fruity breath; nausea; stomach pain; increased hunger or thirst; increased urination -signs and symptoms of kidney injury like trouble passing urine or change in the amount of urine -signs and symptoms of liver injury like dark urine, light-colored stools, loss of appetite, nausea, right upper belly pain, yellowing of the eyes or skin -stomach pain -sweating -weight loss Side effects that usually do not require medical attention (report to your doctor or health care professional if they continue or are bothersome): -decreased appetite -diarrhea -tiredness This list may not describe all possible side effects. Call your doctor for medical advice about side effects. You may report side effects to FDA at 1-800-FDA-1088. Where should I keep my medicine? This drug is given in a hospital or clinic and will not be stored at home. NOTE: This sheet is a summary. It may not cover all possible information. If you have questions about this medicine, talk to your doctor, pharmacist, or health care provider.  2018 Elsevier/Gold Standard (2016-01-17 12:29:36)  Pemetrexed  injection What is this medicine? PEMETREXED (PEM e TREX ed) is a chemotherapy drug used to treat lung cancers like non-small cell lung cancer and mesothelioma. It may also be used to treat other cancers. This medicine may be used for other purposes; ask your health care provider or pharmacist if you have questions. COMMON BRAND NAME(S): Alimta What should I tell my health care provider before I take this medicine? They need to know if you have any of these conditions: -infection (especially a virus infection such as chickenpox, cold sores, or herpes) -kidney disease -low blood counts, like low white cell, platelet, or red cell counts -lung or breathing disease, like asthma -radiation therapy -an unusual or allergic reaction to pemetrexed, other medicines, foods, dyes, or preservative -pregnant or trying to get pregnant -breast-feeding How should I use this medicine? This drug is given as an infusion into a vein. It is administered in a hospital or clinic by a specially trained health care professional. Talk to your pediatrician regarding the use of this medicine in children. Special care may be needed. Overdosage: If you think you have taken too much of this medicine contact a poison control center or emergency room at once. NOTE: This medicine is only for you. Do not share this medicine with others. What if I miss a dose? It is important not to miss your dose. Call your doctor or health care professional if you are unable to keep an appointment. What may interact with this medicine? This medicine may interact with the following medications: -Ibuprofen  This list may not describe all possible interactions. Give your health care provider a list of all the medicines, herbs, non-prescription drugs, or dietary supplements you use. Also tell them if you smoke, drink alcohol, or use illegal drugs. Some items may interact with your medicine. What should I watch for while using this medicine? Visit  your doctor for checks on your progress. This drug may make you feel generally unwell. This is not uncommon, as chemotherapy can affect healthy cells as well as cancer cells. Report any side effects. Continue your course of treatment even though you feel ill unless your doctor tells you to stop. In some cases, you may be given additional medicines to help with side effects. Follow all directions for their use. Call your doctor or health care professional for advice if you get a fever, chills or sore throat, or other symptoms of a cold or flu. Do not treat yourself. This drug decreases your body's ability to fight infections. Try to avoid being around people who are sick. This medicine may increase your risk to bruise or bleed. Call your doctor or health care professional if you notice any unusual bleeding. Be careful brushing and flossing your teeth or using a toothpick because you may get an infection or bleed more easily. If you have any dental work done, tell your dentist you are receiving this medicine. Avoid taking products that contain aspirin, acetaminophen, ibuprofen, naproxen, or ketoprofen unless instructed by your doctor. These medicines may hide a fever. Call your doctor or health care professional if you get diarrhea or mouth sores. Do not treat yourself. To protect your kidneys, drink water or other fluids as directed while you are taking this medicine. Do not become pregnant while taking this medicine or for 6 months after stopping it. Women should inform their doctor if they wish to become pregnant or think they might be pregnant. Men should not father a child while taking this medicine and for 3 months after stopping it. This may interfere with the ability to father a child. You should talk to your doctor or health care professional if you are concerned about your fertility. There is a potential for serious side effects to an unborn child. Talk to your health care professional or pharmacist  for more information. Do not breast-feed an infant while taking this medicine or for 1 week after stopping it. What side effects may I notice from receiving this medicine? Side effects that you should report to your doctor or health care professional as soon as possible: -allergic reactions like skin rash, itching or hives, swelling of the face, lips, or tongue -breathing problems -redness, blistering, peeling or loosening of the skin, including inside the mouth -signs and symptoms of bleeding such as bloody or black, tarry stools; red or dark-brown urine; spitting up blood or brown material that looks like coffee grounds; red spots on the skin; unusual bruising or bleeding from the eye, gums, or nose -signs and symptoms of infection like fever or chills; cough; sore throat; pain or trouble passing urine -signs and symptoms of kidney injury like trouble passing urine or change in the amount of urine -signs and symptoms of liver injury like dark yellow or brown urine; general ill feeling or flu-like symptoms; light-colored stools; loss of appetite; nausea; right upper belly pain; unusually weak or tired; yellowing of the eyes or skin Side effects that usually do not require medical attention (report to your doctor or health care professional if they continue or are  bothersome): -constipation -dizziness -mouth sores -nausea, vomiting -pain, tingling, numbness in the hands or feet -unusually weak or tired This list may not describe all possible side effects. Call your doctor for medical advice about side effects. You may report side effects to FDA at 1-800-FDA-1088. Where should I keep my medicine? This drug is given in a hospital or clinic and will not be stored at home. NOTE: This sheet is a summary. It may not cover all possible information. If you have questions about this medicine, talk to your doctor, pharmacist, or health care provider.  2018 Elsevier/Gold Standard (2016-02-07  18:51:46)  Carboplatin injection What is this medicine? CARBOPLATIN (KAR boe pla tin) is a chemotherapy drug. It targets fast dividing cells, like cancer cells, and causes these cells to die. This medicine is used to treat ovarian cancer and many other cancers. This medicine may be used for other purposes; ask your health care provider or pharmacist if you have questions. COMMON BRAND NAME(S): Paraplatin What should I tell my health care provider before I take this medicine? They need to know if you have any of these conditions: -blood disorders -hearing problems -kidney disease -recent or ongoing radiation therapy -an unusual or allergic reaction to carboplatin, cisplatin, other chemotherapy, other medicines, foods, dyes, or preservatives -pregnant or trying to get pregnant -breast-feeding How should I use this medicine? This drug is usually given as an infusion into a vein. It is administered in a hospital or clinic by a specially trained health care professional. Talk to your pediatrician regarding the use of this medicine in children. Special care may be needed. Overdosage: If you think you have taken too much of this medicine contact a poison control center or emergency room at once. NOTE: This medicine is only for you. Do not share this medicine with others. What if I miss a dose? It is important not to miss a dose. Call your doctor or health care professional if you are unable to keep an appointment. What may interact with this medicine? -medicines for seizures -medicines to increase blood counts like filgrastim, pegfilgrastim, sargramostim -some antibiotics like amikacin, gentamicin, neomycin, streptomycin, tobramycin -vaccines Talk to your doctor or health care professional before taking any of these medicines: -acetaminophen -aspirin -ibuprofen -ketoprofen -naproxen This list may not describe all possible interactions. Give your health care provider a list of all the  medicines, herbs, non-prescription drugs, or dietary supplements you use. Also tell them if you smoke, drink alcohol, or use illegal drugs. Some items may interact with your medicine. What should I watch for while using this medicine? Your condition will be monitored carefully while you are receiving this medicine. You will need important blood work done while you are taking this medicine. This drug may make you feel generally unwell. This is not uncommon, as chemotherapy can affect healthy cells as well as cancer cells. Report any side effects. Continue your course of treatment even though you feel ill unless your doctor tells you to stop. In some cases, you may be given additional medicines to help with side effects. Follow all directions for their use. Call your doctor or health care professional for advice if you get a fever, chills or sore throat, or other symptoms of a cold or flu. Do not treat yourself. This drug decreases your body's ability to fight infections. Try to avoid being around people who are sick. This medicine may increase your risk to bruise or bleed. Call your doctor or health care professional if you notice  any unusual bleeding. Be careful brushing and flossing your teeth or using a toothpick because you may get an infection or bleed more easily. If you have any dental work done, tell your dentist you are receiving this medicine. Avoid taking products that contain aspirin, acetaminophen, ibuprofen, naproxen, or ketoprofen unless instructed by your doctor. These medicines may hide a fever. Do not become pregnant while taking this medicine. Women should inform their doctor if they wish to become pregnant or think they might be pregnant. There is a potential for serious side effects to an unborn child. Talk to your health care professional or pharmacist for more information. Do not breast-feed an infant while taking this medicine. What side effects may I notice from receiving this  medicine? Side effects that you should report to your doctor or health care professional as soon as possible: -allergic reactions like skin rash, itching or hives, swelling of the face, lips, or tongue -signs of infection - fever or chills, cough, sore throat, pain or difficulty passing urine -signs of decreased platelets or bleeding - bruising, pinpoint red spots on the skin, black, tarry stools, nosebleeds -signs of decreased red blood cells - unusually weak or tired, fainting spells, lightheadedness -breathing problems -changes in hearing -changes in vision -chest pain -high blood pressure -low blood counts - This drug may decrease the number of white blood cells, red blood cells and platelets. You may be at increased risk for infections and bleeding. -nausea and vomiting -pain, swelling, redness or irritation at the injection site -pain, tingling, numbness in the hands or feet -problems with balance, talking, walking -trouble passing urine or change in the amount of urine Side effects that usually do not require medical attention (report to your doctor or health care professional if they continue or are bothersome): -hair loss -loss of appetite -metallic taste in the mouth or changes in taste This list may not describe all possible side effects. Call your doctor for medical advice about side effects. You may report side effects to FDA at 1-800-FDA-1088. Where should I keep my medicine? This drug is given in a hospital or clinic and will not be stored at home. NOTE: This sheet is a summary. It may not cover all possible information. If you have questions about this medicine, talk to your doctor, pharmacist, or health care provider.  2018 Elsevier/Gold Standard (2007-07-15 14:38:05)

## 2018-01-09 NOTE — Progress Notes (Signed)
[place] [phone]   Fax:(336) [phone]  OFFICE PROGRESS NOTE  Default, Provider, MD No address on file  DIAGNOSIS: Metastatic non-small cell lung cancer, adenocarcinoma initially diagnosed as stage IB (T2a, N0, M0) non-small cell lung cancer, adenocarcinoma diagnosed in December 2017.  The patient has evidence for disease recurrence in July 2019.  Biomarker Findings Tumor Mutational Burden - TMB-Intermediate (16 Muts/Mb) Microsatellite status - MS-Stable Genomic Findings For a complete list of the genes assayed, please refer to the Appendix. KIT amplification KRAS L45G SMAD4 splice site 2563-8L>H TD42 I232F 7 Disease relevant genes with no reportable alterations: EGFR, ALK, BRAF, MET, RET, ERBB2, ROS1   PDL 1 expression is 0%  PRIOR THERAPY:  1) status post left lower lobectomy as well as wedge resection of the left upper lobe on 05/11/2016. 2) Adjuvant systemic chemotherapy with cisplatin 75 MG/M2 and Alimta 500 MG/M2 every 3 weeks. First dose 07/03/2016. Status post 4 cycles.  CURRENT THERAPY: Systemic chemotherapy with carboplatin for AUC of 5, Alimta 500 mg/M2 and Keytruda 200 mg IV every 3 weeks.  Status post 1 cycle.  INTERVAL HISTORY: William Kales [name] 62 y.o. male returns to the clinic today for follow-up visit accompanied by his wife.  The patient tolerated the first cycle of his treatment with carboplatin, Alimta and Ketruda (pembrolizumab) fairly well except for skin rash on the upper extremities as well as chest.  This suspicious to be related to his treatment with Keytruda.  The skin rash is drying out and getting better.  He denied having any nausea, vomiting, diarrhea or constipation.  He denied having any chest pain, shortness of breath, cough or hemoptysis.  He has no fever or chills.  He had MRI of the brain performed recently that showed no evidence for metastatic disease to the brain.  The patient is here today for  evaluation before starting cycle #2 of his treatment.   MEDICAL HISTORY: Past Medical History:  Diagnosis Date  . AAA (abdominal aortic aneurysm) ([place])   . AAA (abdominal aortic aneurysm) without rupture ([place]) 02/04/2014  . Cancer ([place])    lung  . Encounter for antineoplastic chemotherapy 06/06/2016  . History of hepatitis B   . HNP (herniated nucleus pulposus), lumbar    L4 with radiculopathy  . Hypertension   . Lung cancer ([place]) 05/18/2016  . Malignant neoplasm of lower lobe of left lung ([place]) 04/05/2016  . Mass of left lung   . Mitral insufficiency 04/06/2016   This patient will eventually need MV repair if his prognosis is good from oncology standpoint. However, his lung cancer therapy  is the priority right now. His cardiac condition won't preclude possible lung surgery.  Once his lung cancer is under control he will need a TEE to further evaluate his mitral valve anatomy and MR severity, and also have ischemic workup as tere is evidence on calcificati  . Renal insufficiency 10/09/2016  . S/P partial lobectomy of lung 05/11/2016  . Varicose veins of legs     ALLERGIES:  is allergic to tramadol.  MEDICATIONS:  Current Outpatient Medications  Medication Sig Dispense Refill  . folic acid (FOLVITE) 1 MG tablet Take 1 tablet (1 mg total) by mouth daily. 30 tablet 4  . losartan (COZAAR) 25 MG tablet Take 1 tablet (25 mg total) by mouth daily. 90 tablet 3  . prochlorperazine (COMPAZINE) 10 MG tablet Take 1 tablet (10 mg total) by mouth every 6 (six) hours  as needed for nausea or vomiting. 60 tablet 0   No current facility-administered medications for this visit.     SURGICAL HISTORY:  Past Surgical History:  Procedure Laterality Date  . ABDOMINAL AORTIC ENDOVASCULAR STENT GRAFT N/A 03/12/2016   Procedure: ABDOMINAL AORTIC ENDOVASCULAR STENT GRAFT;  Surgeon: Conrad Stanley, MD;  Location: [facility];  Service: Vascular;  Laterality: N/A;  . COLONOSCOPY    . INGUINAL HERNIA REPAIR Left  1975   Left inguinal hernia  . INGUINAL HERNIA REPAIR Right   . LOBECTOMY Left 05/11/2016   Procedure: LEFT LOWER LOBE LOBECTOMY AND LEFT UPPER LOBE  RESECTION AND PLACEMENT OF ON-Q;  Surgeon: [name], MD;  Location: [facility];  Service: Thoracic;  Laterality: Left;  . LUMBAR LAMINECTOMY  October 22, 2012  . LYMPH NODE DISSECTION Left 05/11/2016   Procedure: LYMPH NODE DISSECTION;  Surgeon: [name], MD;  Location: [facility];  Service: Thoracic;  Laterality: Left;  . STAPLING OF BLEBS Left 05/11/2016   Procedure: STAPLING OF APICAL BLEB;  Surgeon: [name], MD;  Location: [facility];  Service: Thoracic;  Laterality: Left;  Marland Kitchen VIDEO ASSISTED THORACOSCOPY Left 05/18/2016   Procedure: VIDEO ASSISTED THORACOSCOPY WITH REMOVAL OF LEFT APICAL BLEB;  Surgeon: [name], MD;  Location: [facility];  Service: Thoracic;  Laterality: Left;  Marland Kitchen VIDEO ASSISTED THORACOSCOPY (VATS)/WEDGE RESECTION Left 05/11/2016   Procedure: LEFT VIDEO ASSISTED THORACOSCOPY (VATS);  Surgeon: [name], MD;  Location: [facility];  Service: Thoracic;  Laterality: Left;  Marland Kitchen VIDEO BRONCHOSCOPY N/A 05/11/2016   Procedure: VIDEO BRONCHOSCOPY, LEFT LUNG;  Surgeon: [name], MD;  Location: [facility];  Service: Thoracic;  Laterality: N/A;  . VIDEO BRONCHOSCOPY N/A 05/18/2016   Procedure: VIDEO BRONCHOSCOPY WITH BRONCHIAL WASHING;  Surgeon: [name], MD;  Location: [facility];  Service: Thoracic;  Laterality: N/A;    REVIEW OF SYSTEMS:  A comprehensive review of systems was negative except for: Integument/breast: positive for rash   PHYSICAL EXAMINATION: General appearance: alert, cooperative and no distress Head: Normocephalic, without obvious abnormality, atraumatic Neck: no adenopathy, no JVD, supple, symmetrical, trachea midline and thyroid not enlarged, symmetric, no tenderness/mass/nodules Lymph nodes: Cervical, supraclavicular, and axillary nodes normal. Resp: clear to auscultation bilaterally Back:  symmetric, no curvature. ROM normal. No CVA tenderness. Cardio: regular rate and rhythm, S1, S2 normal, no murmur, click, rub or gallop GI: soft, non-tender; bowel sounds normal; no masses,  no organomegaly Extremities: extremities normal, atraumatic, no cyanosis or edema         ECOG PERFORMANCE STATUS: 1 - Symptomatic but completely ambulatory  Blood pressure 140/72, pulse 67, temperature 98.2 F (36.8 C), temperature source Oral, resp. rate 18, height _0  (1.905 m), weight 151 lb 8 oz (68.7 kg), SpO2 97 %.  LABORATORY DATA: Lab Results  Component Value Date   WBC 5.8 01/09/2018   HGB 13.2 01/09/2018   HCT 40.4 01/09/2018   MCV 90.2 01/09/2018   PLT 433 (H) 01/09/2018      Chemistry      Component Value Date/Time   NA 137 01/09/2018 1013   NA 138 01/11/2017 1343   K 4.6 01/09/2018 1013   K 4.7 01/11/2017 1343   CL 106 01/09/2018 1013   CO2 22 01/09/2018 1013   CO2 23 01/11/2017 1343   BUN 17 01/09/2018 1013   BUN 20.2 01/11/2017 1343   CREATININE 1.32 (H) 01/09/2018 1013   CREATININE 1.6 (H) 01/11/2017 1343  Component Value Date/Time   CALCIUM 9.7 01/09/2018 1013   CALCIUM 9.3 01/11/2017 1343   ALKPHOS 117 01/09/2018 1013   ALKPHOS 89 01/11/2017 1343   AST 28 01/09/2018 1013   AST 41 (H) 01/11/2017 1343   ALT 20 01/09/2018 1013   ALT 27 01/11/2017 1343   BILITOT 0.4 01/09/2018 1013   BILITOT 0.49 01/11/2017 1343       RADIOGRAPHIC STUDIES: Mr Jeri Cos MW Contrast  Result Date: 12/26/2017 CLINICAL DATA:  62 y/o M; history of non-small cell lung cancer for staging. EXAM: MRI HEAD WITHOUT AND WITH CONTRAST TECHNIQUE: Multiplanar, multiecho pulse sequences of the brain and surrounding structures were obtained without and with intravenous contrast. CONTRAST:  92m MULTIHANCE GADOBENATE DIMEGLUMINE 529 MG/ML IV SOLN COMPARISON:  04/14/2016 MRI of the head. FINDINGS: Brain: No acute infarction, hemorrhage, hydrocephalus, extra-axial collection or mass lesion.  Few nonspecific T2 FLAIR hyperintensities in subcortical and periventricular white matter are compatible with mild chronic microvascular ischemic changes for age. Mild volume loss of the brain. After administration of intravenous contrast there is no abnormal enhancement of the brain. Vascular: Normal flow voids. Skull and upper cervical spine: Normal marrow signal. Sinuses/Orbits: Left sphenoid sinus opacification with polypoid mucosal thickening. Small right sphenoid sinus mucous retention cyst and right maxillary sinus mucous retention cyst. No abnormal signal of mastoid air cells. Orbits are unremarkable. Other: Right parotid gland atrophy, partially visualized. Several tiny T2 hyperintense foci within the left parotid gland. IMPRESSION: 1. No intracranial metastasis identified. 2. Mild chronic microvascular ischemic changes and volume loss of the brain. 3. Left sphenoid sinus opacification. 4. Right parotid gland atrophy. Stable tiny hyperintense foci within the left parotid gland may reflect benign lymphoepithelial lesions seen with Sjogren's disease or HIV. Electronically Signed   By: LKristine GarbeM.D.   On: 12/26/2017 18:02    ASSESSMENT AND PLAN:  This is a very pleasant 62years old white male with a stage IB non-small cell lung cancer, adenocarcinoma status post wedge resection of the left upper lobe followed by 4 cycles of adjuvant systemic chemotherapy with cisplatin and Alimta and he tolerated his treatment well except for fatigue. The patient has been on observation since June 2018.   The recent imaging studies including CT scan of the chest as well as a PET scan showed evidence for disease recurrence with metastatic disease presented with bilateral pulmonary nodules as well as destructive bone lesions at T4 vertebral body, biopsy proven to be metastatic adenocarcinoma. Molecular studies by foundation 1 showed no actionable mutations.  PDL 1 expression was negative. The patient is  currently undergoing treatment with carboplatin, Alimta and Ketruda (pembrolizumab) status post 1 cycle.  He tolerated the first cycle of his treatment well with no concerning complaints except for the skin rash that likely related to his treatment with Keytruda.  It is improving right now.  I will monitor it closely with the second cycle of his treatment. I recommended for the patient to proceed with cycle #2 today as scheduled. I discussed the MRI results with the patient and that showed no concerning findings for disease metastasis to the brain. I will see the patient back for follow-up visit in 3 weeks for evaluation before starting cycle #3. He was advised to call immediately if he has any concerning symptoms in the interval. The patient voices understanding of current disease status and treatment options and is in agreement with the current care plan. All questions were answered. The patient knows to call the  clinic with any problems, questions or concerns. We can certainly see the patient much sooner if necessary.  Disclaimer: This note was dictated with voice recognition software. Similar sounding words can inadvertently be transcribed and may not be corrected upon review.

## 2018-01-16 ENCOUNTER — Inpatient Hospital Stay: Payer: BLUE CROSS/BLUE SHIELD

## 2018-01-16 DIAGNOSIS — C7951 Secondary malignant neoplasm of bone: Secondary | ICD-10-CM | POA: Diagnosis not present

## 2018-01-16 DIAGNOSIS — C3432 Malignant neoplasm of lower lobe, left bronchus or lung: Secondary | ICD-10-CM

## 2018-01-16 DIAGNOSIS — Z5112 Encounter for antineoplastic immunotherapy: Secondary | ICD-10-CM | POA: Diagnosis not present

## 2018-01-16 DIAGNOSIS — C3412 Malignant neoplasm of upper lobe, left bronchus or lung: Secondary | ICD-10-CM | POA: Diagnosis not present

## 2018-01-16 DIAGNOSIS — Z5111 Encounter for antineoplastic chemotherapy: Secondary | ICD-10-CM | POA: Diagnosis not present

## 2018-01-16 LAB — CBC WITH DIFFERENTIAL (CANCER CENTER ONLY)
BASOS ABS: 0 10*3/uL (ref 0.0–0.1)
Basophils Relative: 1 %
Eosinophils Absolute: 0.1 10*3/uL (ref 0.0–0.5)
Eosinophils Relative: 5 %
HEMATOCRIT: 39.3 % (ref 38.4–49.9)
Hemoglobin: 12.9 g/dL — ABNORMAL LOW (ref 13.0–17.1)
LYMPHS PCT: 35 %
Lymphs Abs: 0.9 10*3/uL (ref 0.9–3.3)
MCH: 29.8 pg (ref 27.2–33.4)
MCHC: 32.9 g/dL (ref 32.0–36.0)
MCV: 90.8 fL (ref 79.3–98.0)
MONO ABS: 0.1 10*3/uL (ref 0.1–0.9)
MONOS PCT: 4 %
NEUTROS ABS: 1.4 10*3/uL — AB (ref 1.5–6.5)
Neutrophils Relative %: 55 %
Platelet Count: 173 10*3/uL (ref 140–400)
RBC: 4.33 MIL/uL (ref 4.20–5.82)
RDW: 13.9 % (ref 11.0–14.6)
WBC Count: 2.5 10*3/uL — ABNORMAL LOW (ref 4.0–10.3)

## 2018-01-16 LAB — CMP (CANCER CENTER ONLY)
ALT: 37 U/L (ref 0–44)
AST: 35 U/L (ref 15–41)
Albumin: 3.6 g/dL (ref 3.5–5.0)
Alkaline Phosphatase: 111 U/L (ref 38–126)
Anion gap: 7 (ref 5–15)
BUN: 18 mg/dL (ref 8–23)
CO2: 24 mmol/L (ref 22–32)
CREATININE: 1.23 mg/dL (ref 0.61–1.24)
Calcium: 9.4 mg/dL (ref 8.9–10.3)
Chloride: 106 mmol/L (ref 98–111)
GFR, Est AFR Am: >60 mL/min (ref >60)
GFR, Estimated: >60 mL/min (ref >60)
Glucose, Bld: 153 mg/dL — ABNORMAL HIGH (ref 70–99)
POTASSIUM: 4.6 mmol/L (ref 3.5–5.1)
Sodium: 137 mmol/L (ref 135–145)
Total Bilirubin: 0.5 mg/dL (ref 0.3–1.2)
Total Protein: 7.2 g/dL (ref 6.5–8.1)

## 2018-01-23 ENCOUNTER — Inpatient Hospital Stay: Payer: BLUE CROSS/BLUE SHIELD | Attending: Internal Medicine

## 2018-01-23 DIAGNOSIS — C3432 Malignant neoplasm of lower lobe, left bronchus or lung: Secondary | ICD-10-CM | POA: Insufficient documentation

## 2018-01-23 DIAGNOSIS — Z5112 Encounter for antineoplastic immunotherapy: Secondary | ICD-10-CM | POA: Diagnosis not present

## 2018-01-23 DIAGNOSIS — Z5111 Encounter for antineoplastic chemotherapy: Secondary | ICD-10-CM | POA: Diagnosis not present

## 2018-01-23 DIAGNOSIS — C7951 Secondary malignant neoplasm of bone: Secondary | ICD-10-CM | POA: Insufficient documentation

## 2018-01-23 DIAGNOSIS — C3412 Malignant neoplasm of upper lobe, left bronchus or lung: Secondary | ICD-10-CM | POA: Diagnosis not present

## 2018-01-23 LAB — CMP (CANCER CENTER ONLY)
ALK PHOS: 114 U/L (ref 38–126)
ALT: 35 U/L (ref 0–44)
ANION GAP: 8 (ref 5–15)
AST: 35 U/L (ref 15–41)
Albumin: 3.6 g/dL (ref 3.5–5.0)
BUN: 16 mg/dL (ref 8–23)
CALCIUM: 8.8 mg/dL — AB (ref 8.9–10.3)
CO2: 21 mmol/L — ABNORMAL LOW (ref 22–32)
CREATININE: 1.41 mg/dL — AB (ref 0.61–1.24)
Chloride: 105 mmol/L (ref 98–111)
GFR, EST AFRICAN AMERICAN: 59 mL/min — AB (ref >60)
GFR, EST NON AFRICAN AMERICAN: 51 mL/min — AB (ref >60)
Glucose, Bld: 112 mg/dL — ABNORMAL HIGH (ref 70–99)
Potassium: 4.1 mmol/L (ref 3.5–5.1)
SODIUM: 134 mmol/L — AB (ref 135–145)
Total Bilirubin: 0.2 mg/dL — ABNORMAL LOW (ref 0.3–1.2)
Total Protein: 7.1 g/dL (ref 6.5–8.1)

## 2018-01-23 LAB — CBC WITH DIFFERENTIAL (CANCER CENTER ONLY)
Basophils Absolute: 0 10*3/uL (ref 0.0–0.1)
Basophils Relative: 0 %
EOS ABS: 0.2 10*3/uL (ref 0.0–0.5)
EOS PCT: 7 %
HCT: 35.8 % — ABNORMAL LOW (ref 38.4–49.9)
HEMOGLOBIN: 11.9 g/dL — AB (ref 13.0–17.1)
LYMPHS ABS: 0.6 10*3/uL — AB (ref 0.9–3.3)
LYMPHS PCT: 23 %
MCH: 29.6 pg (ref 27.2–33.4)
MCHC: 33.3 g/dL (ref 32.0–36.0)
MCV: 89.1 fL (ref 79.3–98.0)
MONOS PCT: 18 %
Monocytes Absolute: 0.5 10*3/uL (ref 0.1–0.9)
Neutro Abs: 1.4 10*3/uL — ABNORMAL LOW (ref 1.5–6.5)
Neutrophils Relative %: 52 %
PLATELETS: 126 10*3/uL — AB (ref 140–400)
RBC: 4.02 MIL/uL — ABNORMAL LOW (ref 4.20–5.82)
RDW: 13.8 % (ref 11.0–14.6)
WBC Count: 2.7 10*3/uL — ABNORMAL LOW (ref 4.0–10.3)

## 2018-01-29 NOTE — Progress Notes (Signed)
FMLA successfully faxed to [employer] at [phone], as well as to [facility], HR at [place] at [phone]. Mailed copy to patient address on file.

## 2018-01-30 ENCOUNTER — Encounter: Payer: Self-pay | Admitting: Internal Medicine

## 2018-01-30 ENCOUNTER — Inpatient Hospital Stay: Payer: BLUE CROSS/BLUE SHIELD

## 2018-01-30 ENCOUNTER — Inpatient Hospital Stay (HOSPITAL_BASED_OUTPATIENT_CLINIC_OR_DEPARTMENT_OTHER): Payer: BLUE CROSS/BLUE SHIELD | Admitting: Internal Medicine

## 2018-01-30 ENCOUNTER — Telehealth: Payer: Self-pay | Admitting: Internal Medicine

## 2018-01-30 VITALS — BP 145/76 | HR 80 | Temp 98.7°F | Resp 18 | Ht 75.0 in | Wt 152.7 lb

## 2018-01-30 DIAGNOSIS — Z5111 Encounter for antineoplastic chemotherapy: Secondary | ICD-10-CM

## 2018-01-30 DIAGNOSIS — C3432 Malignant neoplasm of lower lobe, left bronchus or lung: Secondary | ICD-10-CM

## 2018-01-30 DIAGNOSIS — C3490 Malignant neoplasm of unspecified part of unspecified bronchus or lung: Secondary | ICD-10-CM

## 2018-01-30 DIAGNOSIS — Z5112 Encounter for antineoplastic immunotherapy: Secondary | ICD-10-CM

## 2018-01-30 DIAGNOSIS — C3412 Malignant neoplasm of upper lobe, left bronchus or lung: Secondary | ICD-10-CM

## 2018-01-30 DIAGNOSIS — C7951 Secondary malignant neoplasm of bone: Secondary | ICD-10-CM | POA: Diagnosis not present

## 2018-01-30 DIAGNOSIS — C3492 Malignant neoplasm of unspecified part of left bronchus or lung: Secondary | ICD-10-CM

## 2018-01-30 LAB — CMP (CANCER CENTER ONLY)
ALK PHOS: 114 U/L (ref 38–126)
ALT: 26 U/L (ref 0–44)
ANION GAP: 10 (ref 5–15)
AST: 28 U/L (ref 15–41)
Albumin: 3.7 g/dL (ref 3.5–5.0)
BUN: 11 mg/dL (ref 8–23)
CALCIUM: 9.2 mg/dL (ref 8.9–10.3)
CO2: 20 mmol/L — AB (ref 22–32)
CREATININE: 1.31 mg/dL — AB (ref 0.61–1.24)
Chloride: 107 mmol/L (ref 98–111)
GFR, EST NON AFRICAN AMERICAN: 56 mL/min — AB (ref >60)
Glucose, Bld: 123 mg/dL — ABNORMAL HIGH (ref 70–99)
Potassium: 4.4 mmol/L (ref 3.5–5.1)
Sodium: 137 mmol/L (ref 135–145)
Total Bilirubin: 0.3 mg/dL (ref 0.3–1.2)
Total Protein: 7.6 g/dL (ref 6.5–8.1)

## 2018-01-30 LAB — CBC WITH DIFFERENTIAL (CANCER CENTER ONLY)
Abs Immature Granulocytes: 0.05 10*3/uL (ref 0.00–0.07)
BASOS PCT: 1 %
Basophils Absolute: 0 10*3/uL (ref 0.0–0.1)
EOS PCT: 5 %
Eosinophils Absolute: 0.3 10*3/uL (ref 0.0–0.5)
HCT: 38 % — ABNORMAL LOW (ref 39.0–52.0)
HEMOGLOBIN: 12.4 g/dL — AB (ref 13.0–17.0)
Immature Granulocytes: 1 %
Lymphocytes Relative: 29 %
Lymphs Abs: 1.7 10*3/uL (ref 0.7–4.0)
MCH: 29.6 pg (ref 26.0–34.0)
MCHC: 32.6 g/dL (ref 30.0–36.0)
MCV: 90.7 fL (ref 80.0–100.0)
MONOS PCT: 14 %
Monocytes Absolute: 0.8 10*3/uL (ref 0.1–1.0)
Neutro Abs: 3.1 10*3/uL (ref 1.7–7.7)
Neutrophils Relative %: 50 %
PLATELETS: 411 10*3/uL — AB (ref 150–400)
RBC: 4.19 MIL/uL — ABNORMAL LOW (ref 4.22–5.81)
RDW: 15.4 % (ref 11.5–15.5)
WBC Count: 6 10*3/uL (ref 4.0–10.5)
nRBC: 0 % (ref 0.0–0.2)

## 2018-01-30 LAB — TSH: TSH: 1.083 u[IU]/mL (ref 0.320–4.118)

## 2018-01-30 MED ORDER — SODIUM CHLORIDE 0.9 % IV SOLN
Freq: Once | INTRAVENOUS | Status: AC
  Administered 2018-01-30: 13:00:00 via INTRAVENOUS
  Filled 2018-01-30: qty 5

## 2018-01-30 MED ORDER — SODIUM CHLORIDE 0.9 % IV SOLN
520.0000 mg/m2 | Freq: Once | INTRAVENOUS | Status: AC
  Administered 2018-01-30: 1000 mg via INTRAVENOUS
  Filled 2018-01-30: qty 40

## 2018-01-30 MED ORDER — CYANOCOBALAMIN 1000 MCG/ML IJ SOLN
1000.0000 ug | Freq: Once | INTRAMUSCULAR | Status: AC
  Administered 2018-01-30: 1000 ug via INTRAMUSCULAR

## 2018-01-30 MED ORDER — CYANOCOBALAMIN 1000 MCG/ML IJ SOLN
INTRAMUSCULAR | Status: AC
  Filled 2018-01-30: qty 1

## 2018-01-30 MED ORDER — PALONOSETRON HCL INJECTION 0.25 MG/5ML
0.2500 mg | Freq: Once | INTRAVENOUS | Status: AC
  Administered 2018-01-30: 0.25 mg via INTRAVENOUS

## 2018-01-30 MED ORDER — SODIUM CHLORIDE 0.9 % IV SOLN
383.5000 mg | Freq: Once | INTRAVENOUS | Status: AC
  Administered 2018-01-30: 380 mg via INTRAVENOUS
  Filled 2018-01-30: qty 38

## 2018-01-30 MED ORDER — SODIUM CHLORIDE 0.9 % IV SOLN
200.0000 mg | Freq: Once | INTRAVENOUS | Status: AC
  Administered 2018-01-30: 200 mg via INTRAVENOUS
  Filled 2018-01-30: qty 8

## 2018-01-30 MED ORDER — PALONOSETRON HCL INJECTION 0.25 MG/5ML
INTRAVENOUS | Status: AC
  Filled 2018-01-30: qty 5

## 2018-01-30 MED ORDER — SODIUM CHLORIDE 0.9 % IV SOLN
Freq: Once | INTRAVENOUS | Status: AC
  Administered 2018-01-30: 12:00:00 via INTRAVENOUS
  Filled 2018-01-30: qty 250

## 2018-01-30 NOTE — Patient Instructions (Signed)
[name] Discharge Instructions for Patients Receiving Chemotherapy  Today you received the following chemotherapy agents: Pembrolizumab (Keytruda), Pemetrexed (Alimta), and Carboplatin (Paraplatin)  To help prevent nausea and vomiting after your treatment, we encourage you to take your nausea medication as directed.    If you develop nausea and vomiting that is not controlled by your nausea medication, call the clinic.   BELOW ARE SYMPTOMS THAT SHOULD BE REPORTED IMMEDIATELY:  *FEVER GREATER THAN 100.5 F  *CHILLS WITH OR WITHOUT FEVER  NAUSEA AND VOMITING THAT IS NOT CONTROLLED WITH YOUR NAUSEA MEDICATION  *UNUSUAL SHORTNESS OF BREATH  *UNUSUAL BRUISING OR BLEEDING  TENDERNESS IN MOUTH AND THROAT WITH OR WITHOUT PRESENCE OF ULCERS  *URINARY PROBLEMS  *BOWEL PROBLEMS  UNUSUAL RASH Items with * indicate a potential emergency and should be followed up as soon as possible.  Feel free to call the clinic should you have any questions or concerns. The clinic phone number is [phone].  Please show the [facility] at check-in to the Emergency Department and triage nurse.

## 2018-01-30 NOTE — Progress Notes (Signed)
[facility] Telephone:(336) [phone]   Fax:(336) [phone]  OFFICE PROGRESS NOTE  Default, Provider, MD No address on file  DIAGNOSIS: Metastatic non-small cell lung cancer, adenocarcinoma initially diagnosed as stage IB (T2a, N0, M0) non-small cell lung cancer, adenocarcinoma diagnosed in December 2017.  The patient has evidence for disease recurrence in July 2019.  Biomarker Findings Tumor Mutational Burden - TMB-Intermediate (16 Muts/Mb) Microsatellite status - MS-Stable Genomic Findings For a complete list of the genes assayed, please refer to the Appendix. KIT amplification KRAS V78I SMAD4 splice site 6962-9B>M WU13 I232F 7 Disease relevant genes with no reportable alterations: EGFR, ALK, BRAF, MET, RET, ERBB2, ROS1   PDL 1 expression is 0%  PRIOR THERAPY:  1) status post left lower lobectomy as well as wedge resection of the left upper lobe on 05/11/2016. 2) Adjuvant systemic chemotherapy with cisplatin 75 MG/M2 and Alimta 500 MG/M2 every 3 weeks. First dose 07/03/2016. Status post 4 cycles.  CURRENT THERAPY: Systemic chemotherapy with carboplatin for AUC of 5, Alimta 500 mg/M2 and Keytruda 200 mg IV every 3 weeks.  Status post 2 cycles.  INTERVAL HISTORY: William Kales [name] 62 y.o. male returns to the clinic today for follow-up visit accompanied by his wife.  The patient is feeling fine today with no concerning complaints except for mild skin rash on the upper extremities.  He denied having any chest pain, shortness of breath, cough or hemoptysis.  He denied having any fever or chills.  He has no nausea, vomiting, diarrhea or constipation.  The patient denied having any recent weight loss or night sweats.  He continues to tolerate his treatment with carboplatin, Alimta and Ketruda (pembrolizumab) fairly well.  He is here today for evaluation before starting cycle #3.   MEDICAL HISTORY: Past Medical History:  Diagnosis Date  . AAA (abdominal aortic  aneurysm) ([place])   . AAA (abdominal aortic aneurysm) without rupture ([place]) 02/04/2014  . Cancer ([place])    lung  . Encounter for antineoplastic chemotherapy 06/06/2016  . History of hepatitis B   . HNP (herniated nucleus pulposus), lumbar    L4 with radiculopathy  . Hypertension   . Lung cancer ([place]) 05/18/2016  . Malignant neoplasm of lower lobe of left lung ([place]) 04/05/2016  . Mass of left lung   . Mitral insufficiency 04/06/2016   This patient will eventually need MV repair if his prognosis is good from oncology standpoint. However, his lung cancer therapy  is the priority right now. His cardiac condition won't preclude possible lung surgery.  Once his lung cancer is under control he will need a TEE to further evaluate his mitral valve anatomy and MR severity, and also have ischemic workup as tere is evidence on calcificati  . Renal insufficiency 10/09/2016  . S/P partial lobectomy of lung 05/11/2016  . Varicose veins of legs     ALLERGIES:  is allergic to tramadol.  MEDICATIONS:  Current Outpatient Medications  Medication Sig Dispense Refill  . folic acid (FOLVITE) 1 MG tablet Take 1 tablet (1 mg total) by mouth daily. 30 tablet 4  . losartan (COZAAR) 25 MG tablet Take 1 tablet (25 mg total) by mouth daily. 90 tablet 3  . prochlorperazine (COMPAZINE) 10 MG tablet Take 1 tablet (10 mg total) by mouth every 6 (six) hours as needed for nausea or vomiting. 60 tablet 0   No current facility-administered medications for this visit.     SURGICAL HISTORY:  Past Surgical  History:  Procedure Laterality Date  . ABDOMINAL AORTIC ENDOVASCULAR STENT GRAFT N/A 03/12/2016   Procedure: ABDOMINAL AORTIC ENDOVASCULAR STENT GRAFT;  Surgeon: Conrad [place], MD;  Location: [facility];  Service: Vascular;  Laterality: N/A;  . COLONOSCOPY    . INGUINAL HERNIA REPAIR Left 1975   Left inguinal hernia  . INGUINAL HERNIA REPAIR Right   . LOBECTOMY Left 05/11/2016   Procedure: LEFT LOWER LOBE LOBECTOMY AND LEFT  UPPER LOBE  RESECTION AND PLACEMENT OF ON-Q;  Surgeon: [name], MD;  Location: [facility];  Service: Thoracic;  Laterality: Left;  . LUMBAR LAMINECTOMY  October 22, 2012  . LYMPH NODE DISSECTION Left 05/11/2016   Procedure: LYMPH NODE DISSECTION;  Surgeon: [name], MD;  Location: [facility];  Service: Thoracic;  Laterality: Left;  . STAPLING OF BLEBS Left 05/11/2016   Procedure: STAPLING OF APICAL BLEB;  Surgeon: [name], MD;  Location: [facility];  Service: Thoracic;  Laterality: Left;  Marland Kitchen VIDEO ASSISTED THORACOSCOPY Left 05/18/2016   Procedure: VIDEO ASSISTED THORACOSCOPY WITH REMOVAL OF LEFT APICAL BLEB;  Surgeon: [name], MD;  Location: [facility];  Service: Thoracic;  Laterality: Left;  Marland Kitchen VIDEO ASSISTED THORACOSCOPY (VATS)/WEDGE RESECTION Left 05/11/2016   Procedure: LEFT VIDEO ASSISTED THORACOSCOPY (VATS);  Surgeon: [name], MD;  Location: [facility];  Service: Thoracic;  Laterality: Left;  Marland Kitchen VIDEO BRONCHOSCOPY N/A 05/11/2016   Procedure: VIDEO BRONCHOSCOPY, LEFT LUNG;  Surgeon: [name], MD;  Location: [facility];  Service: Thoracic;  Laterality: N/A;  . VIDEO BRONCHOSCOPY N/A 05/18/2016   Procedure: VIDEO BRONCHOSCOPY WITH BRONCHIAL WASHING;  Surgeon: [name], MD;  Location: [facility];  Service: Thoracic;  Laterality: N/A;    REVIEW OF SYSTEMS:  A comprehensive review of systems was negative except for: Integument/breast: positive for rash   PHYSICAL EXAMINATION: General appearance: alert, cooperative and no distress Head: Normocephalic, without obvious abnormality, atraumatic Neck: no adenopathy, no JVD, supple, symmetrical, trachea midline and thyroid not enlarged, symmetric, no tenderness/mass/nodules Lymph nodes: Cervical, supraclavicular, and axillary nodes normal. Resp: clear to auscultation bilaterally Back: symmetric, no curvature. ROM normal. No CVA tenderness. Cardio: regular rate and rhythm, S1, S2 normal, no murmur, click, rub or gallop GI: soft,  non-tender; bowel sounds normal; no masses,  no organomegaly Extremities: extremities normal, atraumatic, no cyanosis or edema  Skin rash is still the same as the last visit.     ECOG PERFORMANCE STATUS: 1 - Symptomatic but completely ambulatory  Blood pressure (!) 145/76, pulse 80, temperature 98.7 F (37.1 C), temperature source Oral, resp. rate 18, height '6\' 3"'  (1.905 m), weight 152 lb 11.2 oz (69.3 kg), SpO2 98 %.  LABORATORY DATA: Lab Results  Component Value Date   WBC 6.0 01/30/2018   HGB 12.4 (L) 01/30/2018   HCT 38.0 (L) 01/30/2018   MCV 90.7 01/30/2018   PLT 411 (H) 01/30/2018      Chemistry      Component Value Date/Time   NA 134 (L) 01/23/2018 1057   NA 138 01/11/2017 1343   K 4.1 01/23/2018 1057   K 4.7 01/11/2017 1343   CL 105 01/23/2018 1057   CO2 21 (L) 01/23/2018 1057   CO2 23 01/11/2017 1343   BUN 16 01/23/2018 1057   BUN 20.2 01/11/2017 1343   CREATININE 1.41 (H) 01/23/2018 1057   CREATININE 1.6 (H) 01/11/2017 1343      Component Value Date/Time   CALCIUM 8.8 (L) 01/23/2018 1057   CALCIUM  9.3 01/11/2017 1343   ALKPHOS 114 01/23/2018 1057   ALKPHOS 89 01/11/2017 1343   AST 35 01/23/2018 1057   AST 41 (H) 01/11/2017 1343   ALT 35 01/23/2018 1057   ALT 27 01/11/2017 1343   BILITOT 0.2 (L) 01/23/2018 1057   BILITOT 0.49 01/11/2017 1343       RADIOGRAPHIC STUDIES: No results found.  ASSESSMENT AND PLAN:  This is a very pleasant 62 years old white male with a stage IB non-small cell lung cancer, adenocarcinoma status post wedge resection of the left upper lobe followed by 4 cycles of adjuvant systemic chemotherapy with cisplatin and Alimta and he tolerated his treatment well except for fatigue. The patient has been on observation since June 2018.   The recent imaging studies including CT scan of the chest as well as a PET scan showed evidence for disease recurrence with metastatic disease presented with bilateral pulmonary nodules as well as  destructive bone lesions at T4 vertebral body, biopsy proven to be metastatic adenocarcinoma. Molecular studies by foundation 1 showed no actionable mutations.  PDL 1 expression was negative. The patient is currently undergoing treatment with carboplatin, Alimta and Ketruda (pembrolizumab) status post 2 cycles. He continues to tolerate the treatment well with no concerning adverse effects. I recommended for him to proceed with cycle #3 today as scheduled. I will see the patient back for follow-up visit in 3 weeks for evaluation after repeating CT scan of the chest, abdomen and pelvis for restaging of his disease. The patient was advised to call immediately if he has any concerning symptoms in the interval. The patient voices understanding of current disease status and treatment options and is in agreement with the current care plan. All questions were answered. The patient knows to call the clinic with any problems, questions or concerns. We can certainly see the patient much sooner if necessary.  Disclaimer: This note was dictated with voice recognition software. Similar sounding words can inadvertently be transcribed and may not be corrected upon review.

## 2018-01-30 NOTE — Telephone Encounter (Signed)
Appts already scheduled per 10/10 los - no additional appts added.

## 2018-02-06 ENCOUNTER — Inpatient Hospital Stay: Payer: BLUE CROSS/BLUE SHIELD

## 2018-02-06 DIAGNOSIS — Z5111 Encounter for antineoplastic chemotherapy: Secondary | ICD-10-CM | POA: Diagnosis not present

## 2018-02-06 DIAGNOSIS — Z5112 Encounter for antineoplastic immunotherapy: Secondary | ICD-10-CM | POA: Diagnosis not present

## 2018-02-06 DIAGNOSIS — C7951 Secondary malignant neoplasm of bone: Secondary | ICD-10-CM | POA: Diagnosis not present

## 2018-02-06 DIAGNOSIS — C3432 Malignant neoplasm of lower lobe, left bronchus or lung: Secondary | ICD-10-CM

## 2018-02-06 DIAGNOSIS — C3412 Malignant neoplasm of upper lobe, left bronchus or lung: Secondary | ICD-10-CM | POA: Diagnosis not present

## 2018-02-06 LAB — CBC WITH DIFFERENTIAL (CANCER CENTER ONLY)
ABS IMMATURE GRANULOCYTES: 0 10*3/uL (ref 0.00–0.07)
BASOS ABS: 0 10*3/uL (ref 0.0–0.1)
Basophils Relative: 1 %
Eosinophils Absolute: 0.1 10*3/uL (ref 0.0–0.5)
Eosinophils Relative: 3 %
HCT: 37.1 % — ABNORMAL LOW (ref 39.0–52.0)
HEMOGLOBIN: 12 g/dL — AB (ref 13.0–17.0)
Immature Granulocytes: 0 %
LYMPHS PCT: 45 %
Lymphs Abs: 1.3 10*3/uL (ref 0.7–4.0)
MCH: 29.9 pg (ref 26.0–34.0)
MCHC: 32.3 g/dL (ref 30.0–36.0)
MCV: 92.3 fL (ref 80.0–100.0)
MONO ABS: 0.2 10*3/uL (ref 0.1–1.0)
Monocytes Relative: 8 %
NEUTROS ABS: 1.2 10*3/uL — AB (ref 1.7–7.7)
NRBC: 0 % (ref 0.0–0.2)
Neutrophils Relative %: 43 %
Platelet Count: 200 10*3/uL (ref 150–400)
RBC: 4.02 MIL/uL — AB (ref 4.22–5.81)
RDW: 15.2 % (ref 11.5–15.5)
WBC: 2.7 10*3/uL — AB (ref 4.0–10.5)

## 2018-02-06 LAB — CMP (CANCER CENTER ONLY)
ALBUMIN: 3.9 g/dL (ref 3.5–5.0)
ALK PHOS: 118 U/L (ref 38–126)
ALT: 44 U/L (ref 0–44)
AST: 42 U/L — ABNORMAL HIGH (ref 15–41)
Anion gap: 9 (ref 5–15)
BUN: 26 mg/dL — ABNORMAL HIGH (ref 8–23)
CALCIUM: 9.6 mg/dL (ref 8.9–10.3)
CO2: 23 mmol/L (ref 22–32)
CREATININE: 1.23 mg/dL (ref 0.61–1.24)
Chloride: 106 mmol/L (ref 98–111)
GFR, Est AFR Am: >60 mL/min (ref >60)
GFR, Estimated: >60 mL/min (ref >60)
GLUCOSE: 85 mg/dL (ref 70–99)
Potassium: 5 mmol/L (ref 3.5–5.1)
SODIUM: 138 mmol/L (ref 135–145)
Total Bilirubin: 0.6 mg/dL (ref 0.3–1.2)
Total Protein: 7.9 g/dL (ref 6.5–8.1)

## 2018-02-12 DIAGNOSIS — H6123 Impacted cerumen, bilateral: Secondary | ICD-10-CM | POA: Diagnosis not present

## 2018-02-13 ENCOUNTER — Inpatient Hospital Stay: Payer: BLUE CROSS/BLUE SHIELD

## 2018-02-13 DIAGNOSIS — C3432 Malignant neoplasm of lower lobe, left bronchus or lung: Secondary | ICD-10-CM

## 2018-02-13 DIAGNOSIS — C7951 Secondary malignant neoplasm of bone: Secondary | ICD-10-CM | POA: Diagnosis not present

## 2018-02-13 DIAGNOSIS — Z5111 Encounter for antineoplastic chemotherapy: Secondary | ICD-10-CM | POA: Diagnosis not present

## 2018-02-13 DIAGNOSIS — Z5112 Encounter for antineoplastic immunotherapy: Secondary | ICD-10-CM | POA: Diagnosis not present

## 2018-02-13 DIAGNOSIS — C3412 Malignant neoplasm of upper lobe, left bronchus or lung: Secondary | ICD-10-CM | POA: Diagnosis not present

## 2018-02-13 LAB — CMP (CANCER CENTER ONLY)
ALK PHOS: 112 U/L (ref 38–126)
ALT: 42 U/L (ref 0–44)
ANION GAP: 9 (ref 5–15)
AST: 41 U/L (ref 15–41)
Albumin: 3.7 g/dL (ref 3.5–5.0)
BUN: 15 mg/dL (ref 8–23)
CALCIUM: 9 mg/dL (ref 8.9–10.3)
CO2: 22 mmol/L (ref 22–32)
CREATININE: 1.25 mg/dL — AB (ref 0.61–1.24)
Chloride: 106 mmol/L (ref 98–111)
GFR, Estimated: 59 mL/min — ABNORMAL LOW (ref >60)
Glucose, Bld: 111 mg/dL — ABNORMAL HIGH (ref 70–99)
Potassium: 4.3 mmol/L (ref 3.5–5.1)
Sodium: 137 mmol/L (ref 135–145)
TOTAL PROTEIN: 7.4 g/dL (ref 6.5–8.1)
Total Bilirubin: 0.3 mg/dL (ref 0.3–1.2)

## 2018-02-13 LAB — CBC WITH DIFFERENTIAL (CANCER CENTER ONLY)
ABS IMMATURE GRANULOCYTES: 0.01 10*3/uL (ref 0.00–0.07)
Basophils Absolute: 0 10*3/uL (ref 0.0–0.1)
Basophils Relative: 0 %
Eosinophils Absolute: 0.2 10*3/uL (ref 0.0–0.5)
Eosinophils Relative: 3 %
HCT: 35.1 % — ABNORMAL LOW (ref 39.0–52.0)
HEMOGLOBIN: 11.4 g/dL — AB (ref 13.0–17.0)
IMMATURE GRANULOCYTES: 0 %
LYMPHS PCT: 30 %
Lymphs Abs: 1.4 10*3/uL (ref 0.7–4.0)
MCH: 30.2 pg (ref 26.0–34.0)
MCHC: 32.5 g/dL (ref 30.0–36.0)
MCV: 93.1 fL (ref 80.0–100.0)
MONO ABS: 0.7 10*3/uL (ref 0.1–1.0)
MONOS PCT: 15 %
NEUTROS ABS: 2.5 10*3/uL (ref 1.7–7.7)
Neutrophils Relative %: 52 %
Platelet Count: 136 10*3/uL — ABNORMAL LOW (ref 150–400)
RBC: 3.77 MIL/uL — AB (ref 4.22–5.81)
RDW: 15.9 % — ABNORMAL HIGH (ref 11.5–15.5)
WBC: 4.8 10*3/uL (ref 4.0–10.5)
nRBC: 0 % (ref 0.0–0.2)

## 2018-02-17 NOTE — Progress Notes (Signed)
Disability successfully faxed to [employer] attn: Byrd Hesselbach at [phone]. Mailed copy to patient address on file.

## 2018-02-18 ENCOUNTER — Encounter (HOSPITAL_COMMUNITY): Payer: Self-pay

## 2018-02-18 ENCOUNTER — Ambulatory Visit (HOSPITAL_COMMUNITY)
Admission: RE | Admit: 2018-02-18 | Discharge: 2018-02-18 | Disposition: A | Discharge status: Home / Self Care | Payer: BLUE CROSS/BLUE SHIELD | Source: Ambulatory Visit | Attending: Internal Medicine | Admitting: Internal Medicine

## 2018-02-18 DIAGNOSIS — J438 Other emphysema: Secondary | ICD-10-CM | POA: Insufficient documentation

## 2018-02-18 DIAGNOSIS — C3490 Malignant neoplasm of unspecified part of unspecified bronchus or lung: Secondary | ICD-10-CM | POA: Insufficient documentation

## 2018-02-18 DIAGNOSIS — J432 Centrilobular emphysema: Secondary | ICD-10-CM | POA: Diagnosis not present

## 2018-02-18 DIAGNOSIS — I700 Atherosclerosis of aorta: Secondary | ICD-10-CM | POA: Insufficient documentation

## 2018-02-18 DIAGNOSIS — M899 Disorder of bone, unspecified: Secondary | ICD-10-CM | POA: Insufficient documentation

## 2018-02-18 DIAGNOSIS — Z95828 Presence of other vascular implants and grafts: Secondary | ICD-10-CM | POA: Diagnosis not present

## 2018-02-18 DIAGNOSIS — R918 Other nonspecific abnormal finding of lung field: Secondary | ICD-10-CM | POA: Diagnosis not present

## 2018-02-18 MED ORDER — SODIUM CHLORIDE 0.9 % IJ SOLN
INTRAMUSCULAR | Status: AC
  Filled 2018-02-18: qty 50

## 2018-02-18 MED ORDER — IOHEXOL 300 MG/ML  SOLN
100.0000 mL | Freq: Once | INTRAMUSCULAR | Status: AC | PRN
  Administered 2018-02-18: 100 mL via INTRAVENOUS

## 2018-02-20 ENCOUNTER — Inpatient Hospital Stay (HOSPITAL_BASED_OUTPATIENT_CLINIC_OR_DEPARTMENT_OTHER): Payer: BLUE CROSS/BLUE SHIELD | Admitting: Oncology

## 2018-02-20 ENCOUNTER — Inpatient Hospital Stay: Payer: BLUE CROSS/BLUE SHIELD

## 2018-02-20 ENCOUNTER — Encounter: Payer: Self-pay | Admitting: Oncology

## 2018-02-20 VITALS — BP 155/86 | HR 67 | Temp 98.2°F | Resp 17 | Ht 75.0 in | Wt 158.7 lb

## 2018-02-20 DIAGNOSIS — C3432 Malignant neoplasm of lower lobe, left bronchus or lung: Secondary | ICD-10-CM

## 2018-02-20 DIAGNOSIS — R21 Rash and other nonspecific skin eruption: Secondary | ICD-10-CM | POA: Diagnosis not present

## 2018-02-20 DIAGNOSIS — C3492 Malignant neoplasm of unspecified part of left bronchus or lung: Secondary | ICD-10-CM

## 2018-02-20 DIAGNOSIS — C7951 Secondary malignant neoplasm of bone: Secondary | ICD-10-CM | POA: Diagnosis not present

## 2018-02-20 DIAGNOSIS — Z5111 Encounter for antineoplastic chemotherapy: Secondary | ICD-10-CM | POA: Diagnosis not present

## 2018-02-20 DIAGNOSIS — Z5112 Encounter for antineoplastic immunotherapy: Secondary | ICD-10-CM | POA: Diagnosis not present

## 2018-02-20 DIAGNOSIS — C3412 Malignant neoplasm of upper lobe, left bronchus or lung: Secondary | ICD-10-CM

## 2018-02-20 LAB — CBC WITH DIFFERENTIAL (CANCER CENTER ONLY)
ABS IMMATURE GRANULOCYTES: 0.03 10*3/uL (ref 0.00–0.07)
BASOS PCT: 1 %
Basophils Absolute: 0 10*3/uL (ref 0.0–0.1)
EOS ABS: 0.2 10*3/uL (ref 0.0–0.5)
EOS PCT: 4 %
HCT: 33.4 % — ABNORMAL LOW (ref 39.0–52.0)
Hemoglobin: 10.8 g/dL — ABNORMAL LOW (ref 13.0–17.0)
Immature Granulocytes: 1 %
Lymphocytes Relative: 27 %
Lymphs Abs: 1.4 10*3/uL (ref 0.7–4.0)
MCH: 30.3 pg (ref 26.0–34.0)
MCHC: 32.3 g/dL (ref 30.0–36.0)
MCV: 93.8 fL (ref 80.0–100.0)
MONO ABS: 0.9 10*3/uL (ref 0.1–1.0)
MONOS PCT: 16 %
Neutro Abs: 2.8 10*3/uL (ref 1.7–7.7)
Neutrophils Relative %: 51 %
PLATELETS: 389 10*3/uL (ref 150–400)
RBC: 3.56 MIL/uL — ABNORMAL LOW (ref 4.22–5.81)
RDW: 17.3 % — ABNORMAL HIGH (ref 11.5–15.5)
WBC Count: 5.3 10*3/uL (ref 4.0–10.5)
nRBC: 0 % (ref 0.0–0.2)

## 2018-02-20 LAB — CMP (CANCER CENTER ONLY)
ALT: 37 U/L (ref 0–44)
AST: 38 U/L (ref 15–41)
Albumin: 3.7 g/dL (ref 3.5–5.0)
Alkaline Phosphatase: 104 U/L (ref 38–126)
Anion gap: 7 (ref 5–15)
BUN: 14 mg/dL (ref 8–23)
CHLORIDE: 108 mmol/L (ref 98–111)
CO2: 24 mmol/L (ref 22–32)
CREATININE: 1.28 mg/dL — AB (ref 0.61–1.24)
Calcium: 8.9 mg/dL (ref 8.9–10.3)
GFR, EST NON AFRICAN AMERICAN: 58 mL/min — AB (ref >60)
Glucose, Bld: 90 mg/dL (ref 70–99)
Potassium: 4.3 mmol/L (ref 3.5–5.1)
Sodium: 139 mmol/L (ref 135–145)
Total Bilirubin: 0.2 mg/dL — ABNORMAL LOW (ref 0.3–1.2)
Total Protein: 7.3 g/dL (ref 6.5–8.1)

## 2018-02-20 LAB — TSH: TSH: 0.899 u[IU]/mL (ref 0.320–4.118)

## 2018-02-20 MED ORDER — SODIUM CHLORIDE 0.9 % IV SOLN
Freq: Once | INTRAVENOUS | Status: AC
  Administered 2018-02-20: 12:00:00 via INTRAVENOUS
  Filled 2018-02-20: qty 250

## 2018-02-20 MED ORDER — SODIUM CHLORIDE 0.9 % IV SOLN
Freq: Once | INTRAVENOUS | Status: AC
  Administered 2018-02-20: 13:00:00 via INTRAVENOUS
  Filled 2018-02-20: qty 5

## 2018-02-20 MED ORDER — SODIUM CHLORIDE 0.9 % IV SOLN
410.0000 mg | Freq: Once | INTRAVENOUS | Status: AC
  Administered 2018-02-20: 410 mg via INTRAVENOUS
  Filled 2018-02-20: qty 41

## 2018-02-20 MED ORDER — SODIUM CHLORIDE 0.9 % IV SOLN
200.0000 mg | Freq: Once | INTRAVENOUS | Status: AC
  Administered 2018-02-20: 200 mg via INTRAVENOUS
  Filled 2018-02-20: qty 8

## 2018-02-20 MED ORDER — PALONOSETRON HCL INJECTION 0.25 MG/5ML
INTRAVENOUS | Status: AC
  Filled 2018-02-20: qty 5

## 2018-02-20 MED ORDER — SODIUM CHLORIDE 0.9 % IV SOLN
520.0000 mg/m2 | Freq: Once | INTRAVENOUS | Status: AC
  Administered 2018-02-20: 1000 mg via INTRAVENOUS
  Filled 2018-02-20: qty 40

## 2018-02-20 MED ORDER — PALONOSETRON HCL INJECTION 0.25 MG/5ML
0.2500 mg | Freq: Once | INTRAVENOUS | Status: AC
  Administered 2018-02-20: 0.25 mg via INTRAVENOUS

## 2018-02-20 NOTE — Patient Instructions (Signed)
[facility] Discharge Instructions for Patients Receiving Chemotherapy  Today you received the following chemotherapy agents: pembrolizumab Beryle Flock), pemetrexed (Alimta), and carboplatin (Paraplatin)  To help prevent nausea and vomiting after your treatment, we encourage you to take your nausea medication as directed.    If you develop nausea and vomiting that is not controlled by your nausea medication, call the clinic.   BELOW ARE SYMPTOMS THAT SHOULD BE REPORTED IMMEDIATELY:  *FEVER GREATER THAN 100.5 F  *CHILLS WITH OR WITHOUT FEVER  NAUSEA AND VOMITING THAT IS NOT CONTROLLED WITH YOUR NAUSEA MEDICATION  *UNUSUAL SHORTNESS OF BREATH  *UNUSUAL BRUISING OR BLEEDING  TENDERNESS IN MOUTH AND THROAT WITH OR WITHOUT PRESENCE OF ULCERS  *URINARY PROBLEMS  *BOWEL PROBLEMS  UNUSUAL RASH Items with * indicate a potential emergency and should be followed up as soon as possible.  Feel free to call the clinic should you have any questions or concerns. The clinic phone number is [phone].  Please show the [facility] at check-in to the Emergency Department and triage nurse.

## 2018-02-20 NOTE — Progress Notes (Signed)
Infusion suite questioning rate or IVF prior to treatment.  Ok to run 165ml/hr for total of 250 given per Mikey Bussing, NP

## 2018-02-20 NOTE — Assessment & Plan Note (Addendum)
This is a very pleasant 62 year old white male with a stage IB non-small cell lung cancer, adenocarcinoma status post wedge resection of the left upper lobe followed by 4 cycles of adjuvant systemic chemotherapy with cisplatin and Alimta and he tolerated his treatment well except for fatigue. The patient has been on observation since June 2018.   The recent imaging studies including CT scan of the chest as well as a PET scan showed evidence for disease recurrence with metastatic disease presented with bilateral pulmonary nodules as well as destructive bone lesions at T4 vertebral body, biopsy proven to be metastatic adenocarcinoma. Molecular studies by foundation 1 showed no actionable mutations.  PDL 1 expression was negative. The patient is currently undergoing treatment with carboplatin, Alimta and Ketruda (pembrolizumab) status post 3 cycles.  He is tolerating his treatment well overall with the exception of rash to his bilateral arms.  He had a restaging CT scan of the chest, abdomen, pelvis and is here to discuss the results.  The patient was seen with Dr. Julien Nordmann.  CT scan results were discussed with the patient and his wife which showed no evidence of disease progression.  There is slight improvement in the left upper lobe pulmonary nodules.  Recommend for him to proceed with cycle #4 of carboplatin, Alimta, and Keytruda today as scheduled.  Beginning with cycle #5, he will continue on maintenance Alimta and Keytruda.  He will continue to have weekly labs until the start of cycle #5.  He will follow-up in 3 weeks for evaluation prior to cycle #5 of his treatment.  For the skin rash, recommend that he use hydrocortisone cream.  He will let us know if the rash worsens.  The patient was advised to call immediately if he has any concerning symptoms in the interval. The patient voices understanding of current disease status and treatment options and is in agreement with the current care plan. All  questions were answered. The patient knows to call the clinic with any problems, questions or concerns. We can certainly see the patient much sooner if necessary.

## 2018-02-20 NOTE — Progress Notes (Signed)
[place] OFFICE PROGRESS NOTE  Default, Provider, MD No address on file  DIAGNOSIS: Metastatic non-small cell lung cancer, adenocarcinoma initially diagnosed as stage IB (T2a, N0, M0) non-small cell lung cancer, adenocarcinoma diagnosed in December 2017.  The patient has evidence for disease recurrence in July 2019.  Biomarker Findings Tumor Mutational Burden - TMB-Intermediate (16 Muts/Mb) Microsatellite status - MS-Stable Genomic Findings For a complete list of the genes assayed, please refer to the Appendix. KIT amplification KRAS T03T SMAD4 splice site [id number] XN17 I232F 7 Disease relevant genes with no reportable alterations: EGFR, ALK, BRAF, MET, RET, ERBB2, ROS1   PDL 1 expression is 0%  PRIOR THERAPY:  1) status post left lower lobectomy as well as wedge resection of the left upper lobe on 05/11/2016. 2) Adjuvant systemic chemotherapy with cisplatin 75 MG/M2 and Alimta 500 MG/M2 every 3 weeks. First dose 07/03/2016. Status post 4 cycles.  CURRENT THERAPY: Systemic chemotherapy with carboplatin for AUC of 5, Alimta 500 mg/M2 and Keytruda 200 mg IV every 3 weeks.  Status post 3 cycles.  INTERVAL HISTORY: William Kales [name] 62 y.o. male returns for routine follow-up visit accompanied by his wife.  The patient is feeling fine today with no concerning complaints except for mild skin rash to his bilateral upper extremities.  He has had this rash since beginning chemotherapy.  It has not really changed.  He is not itching.  He denies fevers and chills.  Denies chest pain, shortness breath, cough, hemoptysis.  Denies nausea, vomiting, constipation, diarrhea.  Denies recent weight loss or night sweats.  The patient had a restaging CT scan of the chest, abdomen, pelvis and is here to discuss the results.  MEDICAL HISTORY: Past Medical History:  Diagnosis Date  . AAA (abdominal aortic aneurysm) ([place])   . AAA (abdominal aortic aneurysm) without rupture ([place])  02/04/2014  . Cancer ([place])    lung  . Encounter for antineoplastic chemotherapy 06/06/2016  . History of hepatitis B   . HNP (herniated nucleus pulposus), lumbar    L4 with radiculopathy  . Hypertension   . Lung cancer ([place]) 05/18/2016  . Malignant neoplasm of lower lobe of left lung ([place]) 04/05/2016  . Mass of left lung   . Mitral insufficiency 04/06/2016   This patient will eventually need MV repair if his prognosis is good from oncology standpoint. However, his lung cancer therapy  is the priority right now. His cardiac condition won't preclude possible lung surgery.  Once his lung cancer is under control he will need a TEE to further evaluate his mitral valve anatomy and MR severity, and also have ischemic workup as tere is evidence on calcificati  . Renal insufficiency 10/09/2016  . S/P partial lobectomy of lung 05/11/2016  . Varicose veins of legs     ALLERGIES:  is allergic to tramadol.  MEDICATIONS:  Current Outpatient Medications  Medication Sig Dispense Refill  . folic acid (FOLVITE) 1 MG tablet Take 1 tablet (1 mg total) by mouth daily. 30 tablet 4  . losartan (COZAAR) 25 MG tablet Take 1 tablet (25 mg total) by mouth daily. 90 tablet 3  . prochlorperazine (COMPAZINE) 10 MG tablet Take 1 tablet (10 mg total) by mouth every 6 (six) hours as needed for nausea or vomiting. (Patient not taking: Reported on 02/20/2018) 60 tablet 0   No current facility-administered medications for this visit.     SURGICAL HISTORY:  Past Surgical History:  Procedure Laterality Date  . ABDOMINAL AORTIC ENDOVASCULAR STENT  GRAFT N/A 03/12/2016   Procedure: ABDOMINAL AORTIC ENDOVASCULAR STENT GRAFT;  Surgeon: Conrad , MD;  Location: [facility];  Service: Vascular;  Laterality: N/A;  . COLONOSCOPY    . INGUINAL HERNIA REPAIR Left 1975   Left inguinal hernia  . INGUINAL HERNIA REPAIR Right   . LOBECTOMY Left 05/11/2016   Procedure: LEFT LOWER LOBE LOBECTOMY AND LEFT UPPER LOBE  RESECTION AND  PLACEMENT OF ON-Q;  Surgeon: [name], MD;  Location: [facility];  Service: Thoracic;  Laterality: Left;  . LUMBAR LAMINECTOMY  October 22, 2012  . LYMPH NODE DISSECTION Left 05/11/2016   Procedure: LYMPH NODE DISSECTION;  Surgeon: [name], MD;  Location: [facility];  Service: Thoracic;  Laterality: Left;  . STAPLING OF BLEBS Left 05/11/2016   Procedure: STAPLING OF APICAL BLEB;  Surgeon: [name], MD;  Location: [facility];  Service: Thoracic;  Laterality: Left;  Marland Kitchen VIDEO ASSISTED THORACOSCOPY Left 05/18/2016   Procedure: VIDEO ASSISTED THORACOSCOPY WITH REMOVAL OF LEFT APICAL BLEB;  Surgeon: [name], MD;  Location: [facility];  Service: Thoracic;  Laterality: Left;  Marland Kitchen VIDEO ASSISTED THORACOSCOPY (VATS)/WEDGE RESECTION Left 05/11/2016   Procedure: LEFT VIDEO ASSISTED THORACOSCOPY (VATS);  Surgeon: [name], MD;  Location: [facility];  Service: Thoracic;  Laterality: Left;  Marland Kitchen VIDEO BRONCHOSCOPY N/A 05/11/2016   Procedure: VIDEO BRONCHOSCOPY, LEFT LUNG;  Surgeon: [name], MD;  Location: [facility];  Service: Thoracic;  Laterality: N/A;  . VIDEO BRONCHOSCOPY N/A 05/18/2016   Procedure: VIDEO BRONCHOSCOPY WITH BRONCHIAL WASHING;  Surgeon: [name], MD;  Location: [facility];  Service: Thoracic;  Laterality: N/A;    REVIEW OF SYSTEMS:   Review of Systems  Constitutional: Negative for appetite change, chills, fatigue, fever and unexpected weight change.  HENT:   Negative for mouth sores, nosebleeds, sore throat and trouble swallowing.   Eyes: Negative for eye problems and icterus.  Respiratory: Negative for cough, hemoptysis, shortness of breath and wheezing.   Cardiovascular: Negative for chest pain and leg swelling.  Gastrointestinal: Negative for abdominal pain, constipation, diarrhea, nausea and vomiting.  Genitourinary: Negative for bladder incontinence, difficulty urinating, dysuria, frequency and hematuria.   Musculoskeletal: Negative for back pain, gait problem, neck  pain and neck stiffness.  Skin: Positive for skin rash to his bilateral arms. Neurological: Negative for dizziness, extremity weakness, gait problem, headaches, light-headedness and seizures.  Hematological: Negative for adenopathy. Does not bruise/bleed easily.  Psychiatric/Behavioral: Negative for confusion, depression and sleep disturbance. The patient is not nervous/anxious.     PHYSICAL EXAMINATION:  Blood pressure (!) 155/86, pulse 67, temperature 98.2 F (36.8 C), temperature source Oral, resp. rate 17, height '6\' 3"'$  (1.905 m), weight 158 lb 11.2 oz (72 kg), SpO2 100 %.  ECOG PERFORMANCE STATUS: 1 - Symptomatic but completely ambulatory  Physical Exam  Constitutional: Oriented to person, place, and time and well-developed, well-nourished, and in no distress. No distress.  HENT:  Head: Normocephalic and atraumatic.  Mouth/Throat: Oropharynx is clear and moist. No oropharyngeal exudate.  Eyes: Conjunctivae are normal. Right eye exhibits no discharge. Left eye exhibits no discharge. No scleral icterus.  Neck: Normal range of motion. Neck supple.  Cardiovascular: Normal rate, regular rhythm, normal heart sounds and intact distal pulses.   Pulmonary/Chest: Effort normal and breath sounds normal. No respiratory distress. No wheezes. No rales.  Abdominal: Soft. Bowel sounds are normal. Exhibits no distension and no mass. There is no tenderness.  Musculoskeletal: Normal range of motion. Exhibits  no edema.  Lymphadenopathy:    No cervical adenopathy.  Neurological: Alert and oriented to person, place, and time. Exhibits normal muscle tone. Gait normal. Coordination normal.  Skin: Rash noted to the bilateral arms. Psychiatric: Mood, memory and judgment normal.  Vitals reviewed.  LABORATORY DATA: Lab Results  Component Value Date   WBC 5.3 02/20/2018   HGB 10.8 (L) 02/20/2018   HCT 33.4 (L) 02/20/2018   MCV 93.8 02/20/2018   PLT 389 02/20/2018      Chemistry      Component Value  Date/Time   NA 139 02/20/2018 1051   NA 138 01/11/2017 1343   K 4.3 02/20/2018 1051   K 4.7 01/11/2017 1343   CL 108 02/20/2018 1051   CO2 24 02/20/2018 1051   CO2 23 01/11/2017 1343   BUN 14 02/20/2018 1051   BUN 20.2 01/11/2017 1343   CREATININE 1.28 (H) 02/20/2018 1051   CREATININE 1.6 (H) 01/11/2017 1343      Component Value Date/Time   CALCIUM 8.9 02/20/2018 1051   CALCIUM 9.3 01/11/2017 1343   ALKPHOS 104 02/20/2018 1051   ALKPHOS 89 01/11/2017 1343   AST 38 02/20/2018 1051   AST 41 (H) 01/11/2017 1343   ALT 37 02/20/2018 1051   ALT 27 01/11/2017 1343   BILITOT 0.2 (L) 02/20/2018 1051   BILITOT 0.49 01/11/2017 1343       RADIOGRAPHIC STUDIES:  Ct Chest W Contrast  Result Date: 02/18/2018 CLINICAL DATA:  Lung cancer diagnosed in 2018. Chemotherapy completed. No current complaints. EXAM: CT CHEST, ABDOMEN, AND PELVIS WITH CONTRAST TECHNIQUE: Multidetector CT imaging of the chest, abdomen and pelvis was performed following the standard protocol during bolus administration of intravenous contrast. CONTRAST:  165m OMNIPAQUE IOHEXOL 300 MG/ML  SOLN COMPARISON:  PET-CT 11/12/2017, chest CT 10/23/2017 and abdominopelvic CT 05/15/2017. FINDINGS: CT CHEST FINDINGS Cardiovascular: Atherosclerosis of the aorta and great vessels. There is stable tortuosity and mild ectasia of the aorta. No acute vascular findings are seen. The heart size is normal. There is no pericardial effusion. Mediastinum/Nodes: There are no enlarged mediastinal, hilar or axillary lymph nodes. There is stable mild nodularity of the left thyroid lobe. The trachea and esophagus appear unremarkable. Lungs/Pleura: There is no pleural effusion or pneumothorax. There are stable pleural calcifications posteriorly on the right. There are postsurgical changes from previous left lower lobectomy and left upper lobe wedge resections. There is moderate paraseptal and centrilobular emphysema. The adjacent nodules posteriorly in the  left upper lobe have both decreased in size compared with the previous study. The more medial lesion measures 6 mm maximally on image 48/7 (previously 9 mm), and the more lateral lesion 4 mm on image 53/7 (previously 7 mm). No definite residual endobronchial lesion is seen within the right lower lobe, although there may be some mucous plugging in this area. No new or enlarging pulmonary nodules. Musculoskeletal/Chest wall: There is increased sclerosis surrounding the known lytic lesion involving the T4 spinous process (axial image 49/7), consistent with interval partial response to therapy. No new lytic lesion or pathologic fracture identified. No evidence of chest wall mass. CT ABDOMEN AND PELVIS FINDINGS Hepatobiliary: The liver is normal in density without focal abnormality. No evidence of gallstones, gallbladder wall thickening or biliary dilatation. Pancreas: Unremarkable. No pancreatic ductal dilatation or surrounding inflammatory changes. Spleen: Normal in size without focal abnormality. Adrenals/Urinary Tract: Both adrenal glands appear normal. There are stable small low-density renal lesions, likely cysts. No evidence of urinary tract calculus or hydronephrosis. The  bladder appears unremarkable. Stomach/Bowel: No evidence of bowel wall thickening, distention or surrounding inflammatory change. The appendix appears normal. Vascular/Lymphatic: There are no enlarged abdominal or pelvic lymph nodes. The aorto bi-iliac stent graft is patent. Mild further contraction of the distal abdominal aortic aneurysm, without evidence of endograft leak or large vessel occlusion. Reproductive: Central nodularity and enhancement in the prostate gland most consistent with BPH. Other: No evidence of abdominal wall mass or hernia. No ascites. Musculoskeletal: No acute or significant osseous findings. There are postsurgical changes in the lower lumbar spine. No evidence of osseous metastatic disease. IMPRESSION: 1. Interval  decreased size of adjacent left upper lobe pulmonary nodules, hypermetabolic on previous PET-CT, consistent with metastases responding to interval therapy. No new or enlarging pulmonary nodules. 2. Interval partial sclerosis of lytic lesion involving the T4 spinous process. No new osseous metastases identified. 3. No evidence of metastatic disease in the abdomen or pelvis. 4. Aortic Atherosclerosis (ICD10-I70.0) and Emphysema (ICD10-J43.9). Patent aortoiliac endograft. Electronically Signed   By: Richardean Sale M.D.   On: 02/18/2018 17:13   Ct Abdomen Pelvis W Contrast  Result Date: 02/18/2018 CLINICAL DATA:  Lung cancer diagnosed in 2018. Chemotherapy completed. No current complaints. EXAM: CT CHEST, ABDOMEN, AND PELVIS WITH CONTRAST TECHNIQUE: Multidetector CT imaging of the chest, abdomen and pelvis was performed following the standard protocol during bolus administration of intravenous contrast. CONTRAST:  182m OMNIPAQUE IOHEXOL 300 MG/ML  SOLN COMPARISON:  PET-CT 11/12/2017, chest CT 10/23/2017 and abdominopelvic CT 05/15/2017. FINDINGS: CT CHEST FINDINGS Cardiovascular: Atherosclerosis of the aorta and great vessels. There is stable tortuosity and mild ectasia of the aorta. No acute vascular findings are seen. The heart size is normal. There is no pericardial effusion. Mediastinum/Nodes: There are no enlarged mediastinal, hilar or axillary lymph nodes. There is stable mild nodularity of the left thyroid lobe. The trachea and esophagus appear unremarkable. Lungs/Pleura: There is no pleural effusion or pneumothorax. There are stable pleural calcifications posteriorly on the right. There are postsurgical changes from previous left lower lobectomy and left upper lobe wedge resections. There is moderate paraseptal and centrilobular emphysema. The adjacent nodules posteriorly in the left upper lobe have both decreased in size compared with the previous study. The more medial lesion measures 6 mm maximally on  image 48/7 (previously 9 mm), and the more lateral lesion 4 mm on image 53/7 (previously 7 mm). No definite residual endobronchial lesion is seen within the right lower lobe, although there may be some mucous plugging in this area. No new or enlarging pulmonary nodules. Musculoskeletal/Chest wall: There is increased sclerosis surrounding the known lytic lesion involving the T4 spinous process (axial image 49/7), consistent with interval partial response to therapy. No new lytic lesion or pathologic fracture identified. No evidence of chest wall mass. CT ABDOMEN AND PELVIS FINDINGS Hepatobiliary: The liver is normal in density without focal abnormality. No evidence of gallstones, gallbladder wall thickening or biliary dilatation. Pancreas: Unremarkable. No pancreatic ductal dilatation or surrounding inflammatory changes. Spleen: Normal in size without focal abnormality. Adrenals/Urinary Tract: Both adrenal glands appear normal. There are stable small low-density renal lesions, likely cysts. No evidence of urinary tract calculus or hydronephrosis. The bladder appears unremarkable. Stomach/Bowel: No evidence of bowel wall thickening, distention or surrounding inflammatory change. The appendix appears normal. Vascular/Lymphatic: There are no enlarged abdominal or pelvic lymph nodes. The aorto bi-iliac stent graft is patent. Mild further contraction of the distal abdominal aortic aneurysm, without evidence of endograft leak or large vessel occlusion. Reproductive: Central nodularity and  enhancement in the prostate gland most consistent with BPH. Other: No evidence of abdominal wall mass or hernia. No ascites. Musculoskeletal: No acute or significant osseous findings. There are postsurgical changes in the lower lumbar spine. No evidence of osseous metastatic disease. IMPRESSION: 1. Interval decreased size of adjacent left upper lobe pulmonary nodules, hypermetabolic on previous PET-CT, consistent with metastases  responding to interval therapy. No new or enlarging pulmonary nodules. 2. Interval partial sclerosis of lytic lesion involving the T4 spinous process. No new osseous metastases identified. 3. No evidence of metastatic disease in the abdomen or pelvis. 4. Aortic Atherosclerosis (ICD10-I70.0) and Emphysema (ICD10-J43.9). Patent aortoiliac endograft. Electronically Signed   By: Richardean Sale M.D.   On: 02/18/2018 17:13     ASSESSMENT/PLAN:  Adenocarcinoma of left lung, stage 4 (HCC) This is a very pleasant 62 year old white male with a stage IB non-small cell lung cancer, adenocarcinoma status post wedge resection of the left upper lobe followed by 4 cycles of adjuvant systemic chemotherapy with cisplatin and Alimta and he tolerated his treatment well except for fatigue. The patient has been on observation since June 2018.   The recent imaging studies including CT scan of the chest as well as a PET scan showed evidence for disease recurrence with metastatic disease presented with bilateral pulmonary nodules as well as destructive bone lesions at T4 vertebral body, biopsy proven to be metastatic adenocarcinoma. Molecular studies by foundation 1 showed no actionable mutations.  PDL 1 expression was negative. The patient is currently undergoing treatment with carboplatin, Alimta and Ketruda (pembrolizumab) status post 3 cycles.  He is tolerating his treatment well overall with the exception of rash to his bilateral arms.  He had a restaging CT scan of the chest, abdomen, pelvis and is here to discuss the results.  The patient was seen with Dr. Julien Nordmann.  CT scan results were discussed with the patient and his wife which showed no evidence of disease progression.  There is slight improvement in the left upper lobe pulmonary nodules.  Recommend for him to proceed with cycle #4 of carboplatin, Alimta, and Keytruda today as scheduled.  Beginning with cycle #5, he will continue on maintenance Alimta and Keytruda.   He will continue to have weekly labs until the start of cycle #5.  He will follow-up in 3 weeks for evaluation prior to cycle #5 of his treatment.  For the skin rash, recommend that he use hydrocortisone cream.  He will let us know if the rash worsens.  The patient was advised to call immediately if he has any concerning symptoms in the interval. The patient voices understanding of current disease status and treatment options and is in agreement with the current care plan. All questions were answered. The patient knows to call the clinic with any problems, questions or concerns. We can certainly see the patient much sooner if necessary.   No orders of the defined types were placed in this encounter.   ADDENDUM: Hematology/Oncology Attending: I had a face-to-face encounter with the patient.  I recommended his care plan.  This is a very pleasant 62 years old African-American male with recurrent and metastatic non-small cell lung cancer, adenocarcinoma.  The patient is currently undergoing systemic chemotherapy with carboplatin, Alimta and Ketruda (pembrolizumab) status post 3 cycles.  He has been tolerating this treatment well with no concerning adverse effect except for mild skin rash on the upper extremities.  This could be secondary to his treatment with Keytruda. He had repeat CT  scan of the chest, abdomen and pelvis performed recently.  I personally and independently reviewed the scans and discussed the results with the patient and his wife.  His a scan showed improvement of his disease had no concerning findings for progression. I recommended for the patient to proceed with cycle #4 today as a scheduled. For the skin rash I advised him to apply hydrocortisone cream to these areas. I will see him back for follow-up visit in 3 weeks for evaluation before starting the first cycle of maintenance treatment with Alimta and Ketruda (pembrolizumab). He was advised to call immediately if he has any  concerning symptoms in the interval.  Disclaimer: This note was dictated with voice recognition software. Similar sounding words can inadvertently be transcribed and may be missed upon review. Eilleen Kempf, MD 02/21/18   Mikey Bussing, [place], AGPCNP-BC, AOCNP 02/20/18

## 2018-02-20 NOTE — Progress Notes (Signed)
Pt wife requested for normal saline to be run faster so that the pt would receive the entire 292mL bag by the completion of treatment today. Explained that the order was placed for 57mL/hr as the standard rate, but that this RN would ask Erasmo Downer, NP about increasing the rate to 152mL/hr. Received verbal confirmation to change the rate of NS.   Pt wife concerned after start of Emend that the pre-medication was running too fast. Explained that the medication was to run over 20 minutes and that the pump calculated the necessary rate in order for the fluid in the bag to be infused over that time. Pt wife did not comment further.  Pt wife concerned after chemotherapy completed because it was not drained through the secondary tubing. Explained to pt's wife that it was possible for the line to be drained down to the y-site if preferred, but that the protocol is to stop the chemotherapy or immunotherapy after the chamber is empty and flush with the appropriate primary solution. Pt wife stood up and pointed the secondary tubing to show that the line was not empty yet. This RN asked the pt's wife if she would like to speak with the charge nurse. Pt wife said, "Yes, I would like that." Smiley Houseman, RN came to speak with pt and wife. Explained that this indeed was our protocol, further describing that per pharmacy the remaining fluid and concentrate of medication in the secondary line beyond the chamber was less than a quarter of tbsp. With more concentrated medications, such as iron or abraxane, we do run it to the y-site. Charge nurse also asked if the pt wife would like for the medication to be drained to the y-site per preference, but pt wife refused. This RN and charge RN told pt and wife goodbye upon d/c. Pt responded goodbye. Pt wife walked to the exit, looking straight ahead, and did not respond.

## 2018-02-21 ENCOUNTER — Telehealth: Payer: Self-pay | Admitting: Oncology

## 2018-02-21 NOTE — Telephone Encounter (Signed)
Scheduled appt per 10/31 los - pt to get an updated schedule next visit.

## 2018-02-27 ENCOUNTER — Inpatient Hospital Stay: Payer: BLUE CROSS/BLUE SHIELD | Attending: Internal Medicine

## 2018-02-27 DIAGNOSIS — C3432 Malignant neoplasm of lower lobe, left bronchus or lung: Secondary | ICD-10-CM | POA: Diagnosis not present

## 2018-02-27 DIAGNOSIS — C7951 Secondary malignant neoplasm of bone: Secondary | ICD-10-CM | POA: Diagnosis not present

## 2018-02-27 DIAGNOSIS — Z5111 Encounter for antineoplastic chemotherapy: Secondary | ICD-10-CM | POA: Diagnosis not present

## 2018-02-27 DIAGNOSIS — Z5112 Encounter for antineoplastic immunotherapy: Secondary | ICD-10-CM | POA: Diagnosis not present

## 2018-02-27 DIAGNOSIS — C3412 Malignant neoplasm of upper lobe, left bronchus or lung: Secondary | ICD-10-CM | POA: Diagnosis not present

## 2018-02-27 LAB — CMP (CANCER CENTER ONLY)
ALBUMIN: 3.9 g/dL (ref 3.5–5.0)
ALT: 40 U/L (ref 0–44)
AST: 42 U/L — AB (ref 15–41)
Alkaline Phosphatase: 107 U/L (ref 38–126)
Anion gap: 11 (ref 5–15)
BUN: 17 mg/dL (ref 8–23)
CHLORIDE: 105 mmol/L (ref 98–111)
CO2: 20 mmol/L — AB (ref 22–32)
Calcium: 9.3 mg/dL (ref 8.9–10.3)
Creatinine: 1.29 mg/dL — ABNORMAL HIGH (ref 0.61–1.24)
GFR, Est AFR Am: >60 mL/min (ref >60)
GFR, Estimated: 57 mL/min — ABNORMAL LOW (ref >60)
GLUCOSE: 68 mg/dL — AB (ref 70–99)
Potassium: 4.4 mmol/L (ref 3.5–5.1)
SODIUM: 136 mmol/L (ref 135–145)
Total Bilirubin: 0.7 mg/dL (ref 0.3–1.2)
Total Protein: 7.8 g/dL (ref 6.5–8.1)

## 2018-02-27 LAB — CBC WITH DIFFERENTIAL (CANCER CENTER ONLY)
ABS IMMATURE GRANULOCYTES: 0 10*3/uL (ref 0.00–0.07)
BASOS PCT: 1 %
Basophils Absolute: 0 10*3/uL (ref 0.0–0.1)
Eosinophils Absolute: 0.1 10*3/uL (ref 0.0–0.5)
Eosinophils Relative: 4 %
HCT: 37.5 % — ABNORMAL LOW (ref 39.0–52.0)
Hemoglobin: 11.7 g/dL — ABNORMAL LOW (ref 13.0–17.0)
IMMATURE GRANULOCYTES: 0 %
LYMPHS PCT: 45 %
Lymphs Abs: 1.1 10*3/uL (ref 0.7–4.0)
MCH: 30.4 pg (ref 26.0–34.0)
MCHC: 31.2 g/dL (ref 30.0–36.0)
MCV: 97.4 fL (ref 80.0–100.0)
Monocytes Absolute: 0.1 10*3/uL (ref 0.1–1.0)
Monocytes Relative: 5 %
NEUTROS ABS: 1.1 10*3/uL — AB (ref 1.7–7.7)
NEUTROS PCT: 45 %
PLATELETS: 185 10*3/uL (ref 150–400)
RBC: 3.85 MIL/uL — AB (ref 4.22–5.81)
RDW: 17.3 % — AB (ref 11.5–15.5)
WBC: 2.5 10*3/uL — AB (ref 4.0–10.5)
nRBC: 0 % (ref 0.0–0.2)

## 2018-03-06 ENCOUNTER — Other Ambulatory Visit: Payer: Self-pay | Admitting: Cardiology

## 2018-03-06 ENCOUNTER — Inpatient Hospital Stay: Payer: BLUE CROSS/BLUE SHIELD

## 2018-03-06 DIAGNOSIS — C3432 Malignant neoplasm of lower lobe, left bronchus or lung: Secondary | ICD-10-CM | POA: Diagnosis not present

## 2018-03-06 DIAGNOSIS — Z5112 Encounter for antineoplastic immunotherapy: Secondary | ICD-10-CM | POA: Diagnosis not present

## 2018-03-06 DIAGNOSIS — Z5111 Encounter for antineoplastic chemotherapy: Secondary | ICD-10-CM | POA: Diagnosis not present

## 2018-03-06 DIAGNOSIS — C7951 Secondary malignant neoplasm of bone: Secondary | ICD-10-CM | POA: Diagnosis not present

## 2018-03-06 DIAGNOSIS — C3412 Malignant neoplasm of upper lobe, left bronchus or lung: Secondary | ICD-10-CM | POA: Diagnosis not present

## 2018-03-06 LAB — CBC WITH DIFFERENTIAL (CANCER CENTER ONLY)
ABS IMMATURE GRANULOCYTES: 0.01 10*3/uL (ref 0.00–0.07)
BASOS ABS: 0 10*3/uL (ref 0.0–0.1)
BASOS PCT: 0 %
EOS PCT: 3 %
Eosinophils Absolute: 0.1 10*3/uL (ref 0.0–0.5)
HCT: 30.9 % — ABNORMAL LOW (ref 39.0–52.0)
HEMOGLOBIN: 10 g/dL — AB (ref 13.0–17.0)
Immature Granulocytes: 0 %
LYMPHS PCT: 32 %
Lymphs Abs: 1.3 10*3/uL (ref 0.7–4.0)
MCH: 31 pg (ref 26.0–34.0)
MCHC: 32.4 g/dL (ref 30.0–36.0)
MCV: 95.7 fL (ref 80.0–100.0)
MONO ABS: 0.9 10*3/uL (ref 0.1–1.0)
Monocytes Relative: 23 %
NEUTROS ABS: 1.7 10*3/uL (ref 1.7–7.7)
NRBC: 0 % (ref 0.0–0.2)
Neutrophils Relative %: 42 %
PLATELETS: 112 10*3/uL — AB (ref 150–400)
RBC: 3.23 MIL/uL — AB (ref 4.22–5.81)
RDW: 18.6 % — ABNORMAL HIGH (ref 11.5–15.5)
WBC: 4.1 10*3/uL (ref 4.0–10.5)

## 2018-03-06 LAB — CMP (CANCER CENTER ONLY)
ALBUMIN: 3.6 g/dL (ref 3.5–5.0)
ALK PHOS: 100 U/L (ref 38–126)
ALT: 37 U/L (ref 0–44)
AST: 38 U/L (ref 15–41)
Anion gap: 6 (ref 5–15)
BUN: 11 mg/dL (ref 8–23)
CHLORIDE: 109 mmol/L (ref 98–111)
CO2: 24 mmol/L (ref 22–32)
Calcium: 8.9 mg/dL (ref 8.9–10.3)
Creatinine: 1.32 mg/dL — ABNORMAL HIGH (ref 0.61–1.24)
GFR, Est AFR Am: >60 mL/min (ref >60)
GFR, Estimated: 55 mL/min — ABNORMAL LOW (ref >60)
Glucose, Bld: 85 mg/dL (ref 70–99)
POTASSIUM: 5.2 mmol/L — AB (ref 3.5–5.1)
SODIUM: 139 mmol/L (ref 135–145)
TOTAL PROTEIN: 7 g/dL (ref 6.5–8.1)
Total Bilirubin: 0.2 mg/dL — ABNORMAL LOW (ref 0.3–1.2)

## 2018-03-12 ENCOUNTER — Other Ambulatory Visit: Payer: Self-pay | Admitting: *Deleted

## 2018-03-12 DIAGNOSIS — C3492 Malignant neoplasm of unspecified part of left bronchus or lung: Secondary | ICD-10-CM

## 2018-03-13 ENCOUNTER — Encounter: Payer: Self-pay | Admitting: Internal Medicine

## 2018-03-13 ENCOUNTER — Inpatient Hospital Stay (HOSPITAL_BASED_OUTPATIENT_CLINIC_OR_DEPARTMENT_OTHER): Payer: BLUE CROSS/BLUE SHIELD | Admitting: Internal Medicine

## 2018-03-13 ENCOUNTER — Inpatient Hospital Stay: Payer: BLUE CROSS/BLUE SHIELD

## 2018-03-13 ENCOUNTER — Telehealth: Payer: Self-pay | Admitting: Internal Medicine

## 2018-03-13 VITALS — BP 156/86 | HR 73 | Temp 97.7°F | Resp 18 | Ht 75.0 in | Wt 161.6 lb

## 2018-03-13 DIAGNOSIS — I10 Essential (primary) hypertension: Secondary | ICD-10-CM | POA: Diagnosis not present

## 2018-03-13 DIAGNOSIS — C3432 Malignant neoplasm of lower lobe, left bronchus or lung: Secondary | ICD-10-CM

## 2018-03-13 DIAGNOSIS — Z5112 Encounter for antineoplastic immunotherapy: Secondary | ICD-10-CM | POA: Diagnosis not present

## 2018-03-13 DIAGNOSIS — C3492 Malignant neoplasm of unspecified part of left bronchus or lung: Secondary | ICD-10-CM

## 2018-03-13 DIAGNOSIS — Z5111 Encounter for antineoplastic chemotherapy: Secondary | ICD-10-CM | POA: Diagnosis not present

## 2018-03-13 DIAGNOSIS — C7951 Secondary malignant neoplasm of bone: Secondary | ICD-10-CM | POA: Diagnosis not present

## 2018-03-13 DIAGNOSIS — C3412 Malignant neoplasm of upper lobe, left bronchus or lung: Secondary | ICD-10-CM | POA: Diagnosis not present

## 2018-03-13 LAB — CMP (CANCER CENTER ONLY)
ALBUMIN: 4 g/dL (ref 3.5–5.0)
ALK PHOS: 102 U/L (ref 38–126)
ALT: 33 U/L (ref 0–44)
ANION GAP: 9 (ref 5–15)
AST: 44 U/L — ABNORMAL HIGH (ref 15–41)
BILIRUBIN TOTAL: 0.4 mg/dL (ref 0.3–1.2)
BUN: 13 mg/dL (ref 8–23)
CALCIUM: 9 mg/dL (ref 8.9–10.3)
CO2: 20 mmol/L — AB (ref 22–32)
Chloride: 108 mmol/L (ref 98–111)
Creatinine: 1.46 mg/dL — ABNORMAL HIGH (ref 0.61–1.24)
GFR, EST AFRICAN AMERICAN: 57 mL/min — AB (ref >60)
GFR, Estimated: 49 mL/min — ABNORMAL LOW (ref >60)
GLUCOSE: 102 mg/dL — AB (ref 70–99)
POTASSIUM: 4.1 mmol/L (ref 3.5–5.1)
SODIUM: 137 mmol/L (ref 135–145)
TOTAL PROTEIN: 7.6 g/dL (ref 6.5–8.1)

## 2018-03-13 LAB — CBC WITH DIFFERENTIAL (CANCER CENTER ONLY)
ABS IMMATURE GRANULOCYTES: 0.01 10*3/uL (ref 0.00–0.07)
BASOS PCT: 1 %
Basophils Absolute: 0 10*3/uL (ref 0.0–0.1)
EOS ABS: 0.2 10*3/uL (ref 0.0–0.5)
Eosinophils Relative: 5 %
HCT: 33.9 % — ABNORMAL LOW (ref 39.0–52.0)
Hemoglobin: 11 g/dL — ABNORMAL LOW (ref 13.0–17.0)
IMMATURE GRANULOCYTES: 0 %
Lymphocytes Relative: 26 %
Lymphs Abs: 1.3 10*3/uL (ref 0.7–4.0)
MCH: 31.7 pg (ref 26.0–34.0)
MCHC: 32.4 g/dL (ref 30.0–36.0)
MCV: 97.7 fL (ref 80.0–100.0)
Monocytes Absolute: 0.9 10*3/uL (ref 0.1–1.0)
Monocytes Relative: 18 %
NEUTROS ABS: 2.5 10*3/uL (ref 1.7–7.7)
NEUTROS PCT: 50 %
NRBC: 0 % (ref 0.0–0.2)
PLATELETS: 338 10*3/uL (ref 150–400)
RBC: 3.47 MIL/uL — ABNORMAL LOW (ref 4.22–5.81)
RDW: 20.3 % — AB (ref 11.5–15.5)
WBC Count: 4.9 10*3/uL (ref 4.0–10.5)

## 2018-03-13 LAB — TSH: TSH: 1.12 u[IU]/mL (ref 0.320–4.118)

## 2018-03-13 MED ORDER — SODIUM CHLORIDE 0.9 % IV SOLN
1000.0000 mg | Freq: Once | INTRAVENOUS | Status: AC
  Administered 2018-03-13: 1000 mg via INTRAVENOUS
  Filled 2018-03-13: qty 40

## 2018-03-13 MED ORDER — SODIUM CHLORIDE 0.9 % IV SOLN
Freq: Once | INTRAVENOUS | Status: AC
  Administered 2018-03-13: 11:00:00 via INTRAVENOUS
  Filled 2018-03-13: qty 250

## 2018-03-13 MED ORDER — SODIUM CHLORIDE 0.9 % IV SOLN
200.0000 mg | Freq: Once | INTRAVENOUS | Status: AC
  Administered 2018-03-13: 200 mg via INTRAVENOUS
  Filled 2018-03-13: qty 8

## 2018-03-13 MED ORDER — ONDANSETRON HCL 4 MG/2ML IJ SOLN
INTRAMUSCULAR | Status: AC
  Filled 2018-03-13: qty 4

## 2018-03-13 MED ORDER — ONDANSETRON HCL 4 MG/2ML IJ SOLN
8.0000 mg | Freq: Once | INTRAMUSCULAR | Status: AC
  Administered 2018-03-13: 8 mg via INTRAVENOUS

## 2018-03-13 MED ORDER — DEXAMETHASONE SODIUM PHOSPHATE 10 MG/ML IJ SOLN
INTRAMUSCULAR | Status: AC
  Filled 2018-03-13: qty 1

## 2018-03-13 MED ORDER — SODIUM CHLORIDE 0.9 % IV SOLN
Freq: Once | INTRAVENOUS | Status: DC
  Filled 2018-03-13: qty 4

## 2018-03-13 MED ORDER — DEXAMETHASONE SODIUM PHOSPHATE 10 MG/ML IJ SOLN
10.0000 mg | Freq: Once | INTRAMUSCULAR | Status: AC
  Administered 2018-03-13: 10 mg via INTRAVENOUS

## 2018-03-13 NOTE — Progress Notes (Signed)
[place] [phone]   [phone]  OFFICE PROGRESS NOTE  Patient, No Pcp Per No address on file  DIAGNOSIS: Metastatic non-small cell lung cancer, adenocarcinoma initially diagnosed as stage IB (T2a, N0, M0) non-small cell lung cancer, adenocarcinoma diagnosed in December 2017.  The patient has evidence for disease recurrence in July 2019.  Biomarker Findings Tumor Mutational Burden - TMB-Intermediate (16 Muts/Mb) Microsatellite status - MS-Stable Genomic Findings For a complete list of the genes assayed, please refer to the Appendix. KIT amplification KRAS L93T SMAD4 splice site 7017-7L>T JQ30 I232F 7 Disease relevant genes with no reportable alterations: EGFR, ALK, BRAF, MET, RET, ERBB2, ROS1   PDL 1 expression is 0%  PRIOR THERAPY:  1) status post left lower lobectomy as well as wedge resection of the left upper lobe on 05/11/2016. 2) Adjuvant systemic chemotherapy with cisplatin 75 MG/M2 and Alimta 500 MG/M2 every 3 weeks. First dose 07/03/2016. Status post 4 cycles.  CURRENT THERAPY: Systemic chemotherapy with carboplatin for AUC of 5, Alimta 500 mg/M2 and Keytruda 200 mg IV every 3 weeks.  Status post 4 cycles.  Starting from cycle #5 the patient will be treated with maintenance Alimta and Ketruda (pembrolizumab) every 3 weeks.  INTERVAL HISTORY: [name]. 62 y.o. male returns to the clinic today for follow-up visit accompanied by his wife.  The patient is feeling fine today with no concerning complaints.  He tolerated the last cycle of his treatment fairly well with no concerning adverse effects.  The skin rash on the upper extremities is fading.  He denied having any nausea, vomiting, diarrhea or constipation.  He denied having any recent weight loss or night sweats.  He has no chest pain, shortness of breath, cough or hemoptysis.  The patient is here today for evaluation before starting cycle #5 of his  treatment.   MEDICAL HISTORY: Past Medical History:  Diagnosis Date  . AAA (abdominal aortic aneurysm) ([place])   . AAA (abdominal aortic aneurysm) without rupture ([place]) 02/04/2014  . Cancer ([place])    lung  . Encounter for antineoplastic chemotherapy 06/06/2016  . History of hepatitis B   . HNP (herniated nucleus pulposus), lumbar    L4 with radiculopathy  . Hypertension   . Lung cancer ([place]) 05/18/2016  . Malignant neoplasm of lower lobe of left lung ([place]) 04/05/2016  . Mass of left lung   . Mitral insufficiency 04/06/2016   This patient will eventually need MV repair if his prognosis is good from oncology standpoint. However, his lung cancer therapy  is the priority right now. His cardiac condition won't preclude possible lung surgery.  Once his lung cancer is under control he will need a TEE to further evaluate his mitral valve anatomy and MR severity, and also have ischemic workup as tere is evidence on calcificati  . Renal insufficiency 10/09/2016  . S/P partial lobectomy of lung 05/11/2016  . Varicose veins of legs     ALLERGIES:  is allergic to tramadol.  MEDICATIONS:  Current Outpatient Medications  Medication Sig Dispense Refill  . folic acid (FOLVITE) 1 MG tablet Take 1 tablet (1 mg total) by mouth daily. 30 tablet 4  . losartan (COZAAR) 25 MG tablet TAKE 1 TABLET BY MOUTH EVERY DAY 30 tablet 0  . prochlorperazine (COMPAZINE) 10 MG tablet Take 1 tablet (10 mg total) by mouth every 6 (six) hours as needed for nausea or vomiting. (Patient not taking: Reported on 02/20/2018)  60 tablet 0   No current facility-administered medications for this visit.     SURGICAL HISTORY:  Past Surgical History:  Procedure Laterality Date  . ABDOMINAL AORTIC ENDOVASCULAR STENT GRAFT N/A 03/12/2016   Procedure: ABDOMINAL AORTIC ENDOVASCULAR STENT GRAFT;  Surgeon: Conrad Ithaca, MD;  Location: [facility];  Service: Vascular;  Laterality: N/A;  . COLONOSCOPY    . INGUINAL HERNIA REPAIR Left 1975    Left inguinal hernia  . INGUINAL HERNIA REPAIR Right   . LOBECTOMY Left 05/11/2016   Procedure: LEFT LOWER LOBE LOBECTOMY AND LEFT UPPER LOBE  RESECTION AND PLACEMENT OF ON-Q;  Surgeon: [name], MD;  Location: [facility];  Service: Thoracic;  Laterality: Left;  . LUMBAR LAMINECTOMY  October 22, 2012  . LYMPH NODE DISSECTION Left 05/11/2016   Procedure: LYMPH NODE DISSECTION;  Surgeon: [name], MD;  Location: [facility];  Service: Thoracic;  Laterality: Left;  . STAPLING OF BLEBS Left 05/11/2016   Procedure: STAPLING OF APICAL BLEB;  Surgeon: [name], MD;  Location: [facility];  Service: Thoracic;  Laterality: Left;  Marland Kitchen VIDEO ASSISTED THORACOSCOPY Left 05/18/2016   Procedure: VIDEO ASSISTED THORACOSCOPY WITH REMOVAL OF LEFT APICAL BLEB;  Surgeon: [name], MD;  Location: [facility];  Service: Thoracic;  Laterality: Left;  Marland Kitchen VIDEO ASSISTED THORACOSCOPY (VATS)/WEDGE RESECTION Left 05/11/2016   Procedure: LEFT VIDEO ASSISTED THORACOSCOPY (VATS);  Surgeon: [name], MD;  Location: [facility];  Service: Thoracic;  Laterality: Left;  Marland Kitchen VIDEO BRONCHOSCOPY N/A 05/11/2016   Procedure: VIDEO BRONCHOSCOPY, LEFT LUNG;  Surgeon: [name], MD;  Location: [facility];  Service: Thoracic;  Laterality: N/A;  . VIDEO BRONCHOSCOPY N/A 05/18/2016   Procedure: VIDEO BRONCHOSCOPY WITH BRONCHIAL WASHING;  Surgeon: [name], MD;  Location: [facility];  Service: Thoracic;  Laterality: N/A;    REVIEW OF SYSTEMS:  A comprehensive review of systems was negative.   PHYSICAL EXAMINATION: General appearance: alert, cooperative and no distress Head: Normocephalic, without obvious abnormality, atraumatic Neck: no adenopathy, no JVD, supple, symmetrical, trachea midline and thyroid not enlarged, symmetric, no tenderness/mass/nodules Lymph nodes: Cervical, supraclavicular, and axillary nodes normal. Resp: clear to auscultation bilaterally Back: symmetric, no curvature. ROM normal. No CVA tenderness. Cardio:  regular rate and rhythm, S1, S2 normal, no murmur, click, rub or gallop GI: soft, non-tender; bowel sounds normal; no masses,  no organomegaly Extremities: extremities normal, atraumatic, no cyanosis or edema   ECOG PERFORMANCE STATUS: 1 - Symptomatic but completely ambulatory  Blood pressure (!) 156/86, pulse 73, temperature 97.7 F (36.5 C), temperature source Oral, resp. rate 18, height '6\' 3"'$  (1.905 m), weight 161 lb 9.6 oz (73.3 kg), SpO2 100 %.  LABORATORY DATA: Lab Results  Component Value Date   WBC 4.1 03/06/2018   HGB 10.0 (L) 03/06/2018   HCT 30.9 (L) 03/06/2018   MCV 95.7 03/06/2018   PLT 112 (L) 03/06/2018      Chemistry      Component Value Date/Time   NA 139 03/06/2018 1110   NA 138 01/11/2017 1343   K 5.2 (H) 03/06/2018 1110   K 4.7 01/11/2017 1343   CL 109 03/06/2018 1110   CO2 24 03/06/2018 1110   CO2 23 01/11/2017 1343   BUN 11 03/06/2018 1110   BUN 20.2 01/11/2017 1343   CREATININE 1.32 (H) 03/06/2018 1110   CREATININE 1.6 (H) 01/11/2017 1343      Component Value Date/Time   CALCIUM 8.9 03/06/2018 1110   CALCIUM  9.3 01/11/2017 1343   ALKPHOS 100 03/06/2018 1110   ALKPHOS 89 01/11/2017 1343   AST 38 03/06/2018 1110   AST 41 (H) 01/11/2017 1343   ALT 37 03/06/2018 1110   ALT 27 01/11/2017 1343   BILITOT 0.2 (L) 03/06/2018 1110   BILITOT 0.49 01/11/2017 1343       RADIOGRAPHIC STUDIES: Ct Chest W Contrast  Result Date: 02/18/2018 CLINICAL DATA:  Lung cancer diagnosed in 2018. Chemotherapy completed. No current complaints. EXAM: CT CHEST, ABDOMEN, AND PELVIS WITH CONTRAST TECHNIQUE: Multidetector CT imaging of the chest, abdomen and pelvis was performed following the standard protocol during bolus administration of intravenous contrast. CONTRAST:  151m OMNIPAQUE IOHEXOL 300 MG/ML  SOLN COMPARISON:  PET-CT 11/12/2017, chest CT 10/23/2017 and abdominopelvic CT 05/15/2017. FINDINGS: CT CHEST FINDINGS Cardiovascular: Atherosclerosis of the aorta and  great vessels. There is stable tortuosity and mild ectasia of the aorta. No acute vascular findings are seen. The heart size is normal. There is no pericardial effusion. Mediastinum/Nodes: There are no enlarged mediastinal, hilar or axillary lymph nodes. There is stable mild nodularity of the left thyroid lobe. The trachea and esophagus appear unremarkable. Lungs/Pleura: There is no pleural effusion or pneumothorax. There are stable pleural calcifications posteriorly on the right. There are postsurgical changes from previous left lower lobectomy and left upper lobe wedge resections. There is moderate paraseptal and centrilobular emphysema. The adjacent nodules posteriorly in the left upper lobe have both decreased in size compared with the previous study. The more medial lesion measures 6 mm maximally on image 48/7 (previously 9 mm), and the more lateral lesion 4 mm on image 53/7 (previously 7 mm). No definite residual endobronchial lesion is seen within the right lower lobe, although there may be some mucous plugging in this area. No new or enlarging pulmonary nodules. Musculoskeletal/Chest wall: There is increased sclerosis surrounding the known lytic lesion involving the T4 spinous process (axial image 49/7), consistent with interval partial response to therapy. No new lytic lesion or pathologic fracture identified. No evidence of chest wall mass. CT ABDOMEN AND PELVIS FINDINGS Hepatobiliary: The liver is normal in density without focal abnormality. No evidence of gallstones, gallbladder wall thickening or biliary dilatation. Pancreas: Unremarkable. No pancreatic ductal dilatation or surrounding inflammatory changes. Spleen: Normal in size without focal abnormality. Adrenals/Urinary Tract: Both adrenal glands appear normal. There are stable small low-density renal lesions, likely cysts. No evidence of urinary tract calculus or hydronephrosis. The bladder appears unremarkable. Stomach/Bowel: No evidence of bowel  wall thickening, distention or surrounding inflammatory change. The appendix appears normal. Vascular/Lymphatic: There are no enlarged abdominal or pelvic lymph nodes. The aorto bi-iliac stent graft is patent. Mild further contraction of the distal abdominal aortic aneurysm, without evidence of endograft leak or large vessel occlusion. Reproductive: Central nodularity and enhancement in the prostate gland most consistent with BPH. Other: No evidence of abdominal wall mass or hernia. No ascites. Musculoskeletal: No acute or significant osseous findings. There are postsurgical changes in the lower lumbar spine. No evidence of osseous metastatic disease. IMPRESSION: 1. Interval decreased size of adjacent left upper lobe pulmonary nodules, hypermetabolic on previous PET-CT, consistent with metastases responding to interval therapy. No new or enlarging pulmonary nodules. 2. Interval partial sclerosis of lytic lesion involving the T4 spinous process. No new osseous metastases identified. 3. No evidence of metastatic disease in the abdomen or pelvis. 4. Aortic Atherosclerosis (ICD10-I70.0) and Emphysema (ICD10-J43.9). Patent aortoiliac endograft. Electronically Signed   By: WRichardean SaleM.D.   On: 02/18/2018 17:13  Ct Abdomen Pelvis W Contrast  Result Date: 02/18/2018 CLINICAL DATA:  Lung cancer diagnosed in 2018. Chemotherapy completed. No current complaints. EXAM: CT CHEST, ABDOMEN, AND PELVIS WITH CONTRAST TECHNIQUE: Multidetector CT imaging of the chest, abdomen and pelvis was performed following the standard protocol during bolus administration of intravenous contrast. CONTRAST:  179m OMNIPAQUE IOHEXOL 300 MG/ML  SOLN COMPARISON:  PET-CT 11/12/2017, chest CT 10/23/2017 and abdominopelvic CT 05/15/2017. FINDINGS: CT CHEST FINDINGS Cardiovascular: Atherosclerosis of the aorta and great vessels. There is stable tortuosity and mild ectasia of the aorta. No acute vascular findings are seen. The heart size is  normal. There is no pericardial effusion. Mediastinum/Nodes: There are no enlarged mediastinal, hilar or axillary lymph nodes. There is stable mild nodularity of the left thyroid lobe. The trachea and esophagus appear unremarkable. Lungs/Pleura: There is no pleural effusion or pneumothorax. There are stable pleural calcifications posteriorly on the right. There are postsurgical changes from previous left lower lobectomy and left upper lobe wedge resections. There is moderate paraseptal and centrilobular emphysema. The adjacent nodules posteriorly in the left upper lobe have both decreased in size compared with the previous study. The more medial lesion measures 6 mm maximally on image 48/7 (previously 9 mm), and the more lateral lesion 4 mm on image 53/7 (previously 7 mm). No definite residual endobronchial lesion is seen within the right lower lobe, although there may be some mucous plugging in this area. No new or enlarging pulmonary nodules. Musculoskeletal/Chest wall: There is increased sclerosis surrounding the known lytic lesion involving the T4 spinous process (axial image 49/7), consistent with interval partial response to therapy. No new lytic lesion or pathologic fracture identified. No evidence of chest wall mass. CT ABDOMEN AND PELVIS FINDINGS Hepatobiliary: The liver is normal in density without focal abnormality. No evidence of gallstones, gallbladder wall thickening or biliary dilatation. Pancreas: Unremarkable. No pancreatic ductal dilatation or surrounding inflammatory changes. Spleen: Normal in size without focal abnormality. Adrenals/Urinary Tract: Both adrenal glands appear normal. There are stable small low-density renal lesions, likely cysts. No evidence of urinary tract calculus or hydronephrosis. The bladder appears unremarkable. Stomach/Bowel: No evidence of bowel wall thickening, distention or surrounding inflammatory change. The appendix appears normal. Vascular/Lymphatic: There are no  enlarged abdominal or pelvic lymph nodes. The aorto bi-iliac stent graft is patent. Mild further contraction of the distal abdominal aortic aneurysm, without evidence of endograft leak or large vessel occlusion. Reproductive: Central nodularity and enhancement in the prostate gland most consistent with BPH. Other: No evidence of abdominal wall mass or hernia. No ascites. Musculoskeletal: No acute or significant osseous findings. There are postsurgical changes in the lower lumbar spine. No evidence of osseous metastatic disease. IMPRESSION: 1. Interval decreased size of adjacent left upper lobe pulmonary nodules, hypermetabolic on previous PET-CT, consistent with metastases responding to interval therapy. No new or enlarging pulmonary nodules. 2. Interval partial sclerosis of lytic lesion involving the T4 spinous process. No new osseous metastases identified. 3. No evidence of metastatic disease in the abdomen or pelvis. 4. Aortic Atherosclerosis (ICD10-I70.0) and Emphysema (ICD10-J43.9). Patent aortoiliac endograft. Electronically Signed   By: WRichardean SaleM.D.   On: 02/18/2018 17:13    ASSESSMENT AND PLAN:  This is a very pleasant 62years old white male with a stage IB non-small cell lung cancer, adenocarcinoma status post wedge resection of the left upper lobe followed by 4 cycles of adjuvant systemic chemotherapy with cisplatin and Alimta and he tolerated his treatment well except for fatigue. The patient has been  on observation since June 2018.   The recent imaging studies including CT scan of the chest as well as a PET scan showed evidence for disease recurrence with metastatic disease presented with bilateral pulmonary nodules as well as destructive bone lesions at T4 vertebral body, biopsy proven to be metastatic adenocarcinoma. Molecular studies by foundation 1 showed no actionable mutations.  PDL 1 expression was negative. The patient is currently undergoing treatment with carboplatin, Alimta  and Ketruda (pembrolizumab) status post 4 cycles. He has been tolerating this treatment well with no concerning adverse effects. Starting from cycle #5 the patient will be treated with maintenance Alimta and Ketruda (pembrolizumab) every 3 weeks. I recommended for the patient to proceed with his treatment today as a schedule. I will see him back for follow-up visit in 3 weeks for evaluation before starting cycle #6. The patient was advised to call immediately if he has any concerning symptoms in the interval. The patient voices understanding of current disease status and treatment options and is in agreement with the current care plan. All questions were answered. The patient knows to call the clinic with any problems, questions or concerns. We can certainly see the patient much sooner if necessary.  Disclaimer: This note was dictated with voice recognition software. Similar sounding words can inadvertently be transcribed and may not be corrected upon review.

## 2018-03-13 NOTE — Telephone Encounter (Signed)
Gave pt avs and calendar  °

## 2018-03-13 NOTE — Patient Instructions (Signed)
[facility] Discharge Instructions for Patients Receiving Chemotherapy  Today you received the following chemotherapy agents Keytruda and Alimta  To help prevent nausea and vomiting after your treatment, we encourage you to take your nausea medication as directed   If you develop nausea and vomiting that is not controlled by your nausea medication, call the clinic.   BELOW ARE SYMPTOMS THAT SHOULD BE REPORTED IMMEDIATELY:  *FEVER GREATER THAN 100.5 F  *CHILLS WITH OR WITHOUT FEVER  NAUSEA AND VOMITING THAT IS NOT CONTROLLED WITH YOUR NAUSEA MEDICATION  *UNUSUAL SHORTNESS OF BREATH  *UNUSUAL BRUISING OR BLEEDING  TENDERNESS IN MOUTH AND THROAT WITH OR WITHOUT PRESENCE OF ULCERS  *URINARY PROBLEMS  *BOWEL PROBLEMS  UNUSUAL RASH Items with * indicate a potential emergency and should be followed up as soon as possible.  Feel free to call the clinic should you have any questions or concerns. The clinic phone number is [phone].  Please show the [facility] at check-in to the Emergency Department and triage nurse.

## 2018-03-26 ENCOUNTER — Ambulatory Visit (INDEPENDENT_AMBULATORY_CARE_PROVIDER_SITE_OTHER): Payer: No Typology Code available for payment source | Admitting: Cardiology

## 2018-03-26 ENCOUNTER — Encounter: Payer: Self-pay | Admitting: Cardiology

## 2018-03-26 VITALS — BP 130/72 | HR 78 | Ht 75.0 in | Wt 159.1 lb

## 2018-03-26 DIAGNOSIS — I10 Essential (primary) hypertension: Secondary | ICD-10-CM | POA: Diagnosis not present

## 2018-03-26 DIAGNOSIS — I340 Nonrheumatic mitral (valve) insufficiency: Secondary | ICD-10-CM

## 2018-03-26 DIAGNOSIS — I714 Abdominal aortic aneurysm, without rupture, unspecified: Secondary | ICD-10-CM

## 2018-03-26 NOTE — Progress Notes (Signed)
Cardiology Office Note    Date:  03/26/2018   ID:  William Kales [name], DOB Nov 02, 1955, MRN [id number]  PCP:  Patient, No Pcp Per  Cardiologist:  William Dawley, MD   Chief complain: 3 months follow up  History of Present Illness:  [name]. is a 62 y.o. male, very pleasant patient with a history of of AAA s/p stent graft in 02/2016, 40 PPY history of tobacco abuse, diagnosed with a left lung mass on PFT and PET scan. 8 x 3.2 cm superior segment left lower lobe lung mass is hypermetabolic and consistent with primary lung neoplasm in December 2017. No mediastinal or hilar lymphadenopathy and no findings to suggest metastatic disease. He underwent left lower lobectomy and wedge resection of the left upper lobe for a stage II a moderately differentiated adenocarcinoma of the lung in 04/2016, he has been on adjuvant chemotherapy with cisplatin 75 MG/M2 and Alimta 500 MG/M2 every 3 weeks since then. He follows with Dr. Earlie Server in oncology, because of disease recurrence in July 2019, his chemo was recently changed, currently on systemic chemotherapy with carboplatin for AUC of 5, Alimta 500 mg/M2 and Keytruda 200 mg IV every 3 weeks.  Status post 4 cycles.  Starting from cycle #5 the patient will be treated with maintenance Alimta and Ketruda (pembrolizumab) every 3 weeks.  Past Medical History:  Diagnosis Date  . AAA (abdominal aortic aneurysm) ([place])   . AAA (abdominal aortic aneurysm) without rupture ([place]) 02/04/2014  . Cancer ([place])    lung  . Encounter for antineoplastic chemotherapy 06/06/2016  . History of hepatitis B   . HNP (herniated nucleus pulposus), lumbar    L4 with radiculopathy  . Hypertension   . Lung cancer ([place]) 05/18/2016  . Malignant neoplasm of lower lobe of left lung ([place]) 04/05/2016  . Mass of left lung   . Mitral insufficiency 04/06/2016   This patient will eventually need MV repair if his prognosis is good from oncology standpoint. However, his lung cancer  therapy  is the priority right now. His cardiac condition won't preclude possible lung surgery.  Once his lung cancer is under control he will need a TEE to further evaluate his mitral valve anatomy and MR severity, and also have ischemic workup as tere is evidence on calcificati  . Renal insufficiency 10/09/2016  . S/P partial lobectomy of lung 05/11/2016  . Varicose veins of legs    Past Surgical History:  Procedure Laterality Date  . ABDOMINAL AORTIC ENDOVASCULAR STENT GRAFT N/A 03/12/2016   Procedure: ABDOMINAL AORTIC ENDOVASCULAR STENT GRAFT;  Surgeon: William Lipan, MD;  Location: [facility];  Service: Vascular;  Laterality: N/A;  . COLONOSCOPY    . INGUINAL HERNIA REPAIR Left 1975   Left inguinal hernia  . INGUINAL HERNIA REPAIR Right   . LOBECTOMY Left 05/11/2016   Procedure: LEFT LOWER LOBE LOBECTOMY AND LEFT UPPER LOBE  RESECTION AND PLACEMENT OF ON-Q;  Surgeon: William Isaac, MD;  Location: [facility];  Service: Thoracic;  Laterality: Left;  . LUMBAR LAMINECTOMY  October 22, 2012  . LYMPH NODE DISSECTION Left 05/11/2016   Procedure: LYMPH NODE DISSECTION;  Surgeon: William Isaac, MD;  Location: [place] [facility];  Service: Thoracic;  Laterality: Left;  . STAPLING OF BLEBS Left 05/11/2016   Procedure: STAPLING OF APICAL BLEB;  Surgeon: William Isaac, MD;  Location: [facility];  Service: Thoracic;  Laterality: Left;  Marland Kitchen VIDEO ASSISTED THORACOSCOPY Left 05/18/2016   Procedure: VIDEO ASSISTED  THORACOSCOPY WITH REMOVAL OF LEFT APICAL BLEB;  Surgeon: William Isaac, MD;  Location: [place] [facility];  Service: Thoracic;  Laterality: Left;  Marland Kitchen VIDEO ASSISTED THORACOSCOPY (VATS)/WEDGE RESECTION Left 05/11/2016   Procedure: LEFT VIDEO ASSISTED THORACOSCOPY (VATS);  Surgeon: William Isaac, MD;  Location: [place] [facility];  Service: Thoracic;  Laterality: Left;  Marland Kitchen VIDEO BRONCHOSCOPY N/A 05/11/2016   Procedure: VIDEO BRONCHOSCOPY, LEFT LUNG;  Surgeon: William Isaac, MD;  Location: [place] [facility];  Service: Thoracic;  Laterality: N/A;  . VIDEO  BRONCHOSCOPY N/A 05/18/2016   Procedure: VIDEO BRONCHOSCOPY WITH BRONCHIAL WASHING;  Surgeon: William Isaac, MD;  Location: [facility];  Service: Thoracic;  Laterality: N/A;   Current Medications: Outpatient Medications Prior to Visit  Medication Sig Dispense Refill  . folic acid (FOLVITE) 1 MG tablet Take 1 tablet (1 mg total) by mouth daily. 30 tablet 4  . losartan (COZAAR) 25 MG tablet TAKE 1 TABLET BY MOUTH EVERY DAY 30 tablet 0  . prochlorperazine (COMPAZINE) 10 MG tablet Take 1 tablet (10 mg total) by mouth every 6 (six) hours as needed for nausea or vomiting. (Patient not taking: Reported on 03/26/2018) 60 tablet 0   No facility-administered medications prior to visit.     Allergies:   Tramadol   Social History   Socioeconomic History  . Marital status: Married    Spouse name: Not on file  . Number of children: Not on file  . Years of education: Not on file  . Highest education level: Not on file  Occupational History  . Not on file  Social Needs  . Financial resource strain: Not on file  . Food insecurity:    Worry: Not on file    Inability: Not on file  . Transportation needs:    Medical: Not on file    Non-medical: Not on file  Tobacco Use  . Smoking status: Former Smoker    Packs/day: 1.00    Types: Cigarettes    Last attempt to quit: 03/05/2016    Years since quitting: 2.0  . Smokeless tobacco: Never Used  Substance and Sexual Activity  . Alcohol use: Yes    Alcohol/week: 6.0 standard drinks    Types: 6 Cans of beer per week  . 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 Sexual activity: Not on file  Lifestyle  . Physical activity:    Days per week: Not on file    Minutes per session: Not on file  . Stress: Not on file  Relationships  . Social connections:    Talks on phone: Not on file    Gets together: Not on file    Attends religious service: Not on file    Active member of club or organization: Not on file     Attends meetings of clubs or organizations: Not on file    Relationship status: Not on file  Other Topics Concern  . Not on file  Social History Narrative  . Not on file    Family History:  The patient's family history includes Diabetes in his mother.   ROS:   Please see the history of present illness.    ROS All other systems reviewed and are negative.  PHYSICAL EXAM:   VS:  BP 130/72   Pulse 78   Ht 6\' 3"  (1.905 m)   Wt 159 lb 1.9 oz (72.2 kg)   SpO2 93%   BMI 19.89 kg/m  GEN: Well nourished, well developed, in no acute distress  HEENT: normal  Neck: no JVD, carotid bruits, or masses Cardiac: RRR; 4/6 systolic murmurs, rubs, or gallops,no edema  Respiratory:  clear to auscultation bilaterally, normal work of breathing GI: soft, nontender, nondistended, + BS MS: no deformity or atrophy  Skin: warm and dry, no rash Neuro:  Alert and Oriented x 3, Strength and sensation are intact Psych: euthymic mood, full affect  Wt Readings from Last 3 Encounters:  03/26/18 159 lb 1.9 oz (72.2 kg)  03/13/18 161 lb 9.6 oz (73.3 kg)  02/20/18 158 lb 11.2 oz (72 kg)    Studies/Labs Reviewed:   EKG:  EKG is ordered today.  It shows normal sinus rhythm with LVH, early repolarization unchanged from prior. Personally reviewed.  Recent Labs: 03/13/2018: ALT 33; BUN 13; Creatinine 1.46; Hemoglobin 11.0; Platelet Count 338; Potassium 4.1; Sodium 137; TSH 1.120   Lipid Panel No results found for: CHOL, TRIG, HDL, CHOLHDL, VLDL, LDLCALC, LDLDIRECT  Additional studies/ records that were reviewed today include:   TTE: 11/26/2017 - Left ventricle: The cavity size was normal. There was mild   concentric hypertrophy. Systolic function was normal. The   estimated ejection fraction was in the range of 55% to 60%. Wall   motion was normal; there were no regional wall motion   abnormalities. The study is not technically sufficient to allow   evaluation of LV diastolic function. - Aortic valve:  There was no regurgitation. - Aorta: Aortic root dimension: 48 mm (ED). Ascending aortic   diameter: 41 mm (S). - Aortic root: The aortic root was moderately dilated. - Mitral valve: Severe prolapse, involving the anterior leaflet.   There was mild contractile dysfunction of the papillary muscles,   with abnormal movement of the posteromedial muscle on parasternal   short axis. There was a solid, mobile mass on the left   ventricular aspect of the tip of the anterior leaflet. Etiology   may be myxomatous degeneration, small section of papillary   muscle, or other. Appears similar to prior study. There was   moderate to severe regurgitation directed eccentrically and   posteriorly. - Left atrium: The atrium was severely dilated. - Tricuspid valve: There was moderate regurgitation. - Pulmonic valve: There was trivial regurgitation. - Pulmonary arteries: PA peak pressure: 42 mm Hg (S).  Impressions:  - Prolapse of the anterior mitral valve leaflet with moderate to   severe eccentric regurgitation. Leaflet is thickened, appears to   have mass on ventricular side, may be small section of papillary   muscle vs. other etiology (degeneration, vegetation, etc).   Recommend TEE for further evaluation if clinically warranted.  Cardiopulmonary exercise test.  January 2018 Conclusion:Exercise testing with gas exchange demonstrates normal functional capacity when compared to matched sedentary norms. Patient is primarily ventilatory limited and is clearly of obstructive nature. VE/VCO2 is elevated and indicates significant dead space ventilation most likely due to obstructive lung disease. Patient's measured VO73max is low-normal at 22.1 ml/kg/min and indicates relatively little increased risk associated with lung resection.   ASSESSMENT:    1. Severe mitral regurgitation   2. AAA (abdominal aortic aneurysm) without rupture ([place])   3. Essential hypertension     PLAN:  In order of problems  listed above:  1.  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 LVEF 60-65%. RVSP 42 mmHg, previously 32 mmHg in August 2018.  He is asymptomatic with evidence of lung cancer spread, therefore we will wait, and if in remission of worsening symptoms, we will plan for surgery.  (TEE and surgical consult).  2.  Ascending aortic aneurysm -followed by Dr. Servando Snare, stable 46 mm on most recent echo in September 2018.  Most recent CTA in 7/19 states stable, no measurements. There is no associated aortic regurgitation.  3.  Essential hypertension -evidence of moderate concentric LVH on echocardiogram and ascending aortic aneurysm, I will start losartan 25 mg daily.  4. Lung cancer -as above  Follow-up in 3 months with echocardiogram prior to the visit.  Medication Adjustments/Labs and Tests Ordered: Current medicines are reviewed at length with the patient today.  Concerns regarding medicines are outlined above.  Medication changes, Labs and Tests ordered today are listed in the Patient Instructions below. Patient Instructions  Medication Instructions:   Your physician recommends that you continue on your current medications as directed. Please refer to the Current Medication list given to you today.    Testing/Procedures:  Your physician has requested that you have an echocardiogram. Echocardiography is a painless test that uses sound waves to create images of your heart. It provides your doctor with information about the size and shape of your heart and how well your heart's chambers and valves are working. This procedure takes approximately one hour. There are no restrictions for this procedure.  ECHO NEEDS TO BE SCHEDULED IN 3 MONTHS, A WEEK PRIOR TO HIS 3 MONTH FOLLOW-UP APPOINTMENT WITH [name]      Follow-Up:  3 MONTHS WITH DR Keiasha Diep--PLEASE  SCHEDULE YOUR ECHO A WEEK PRIOR TO THIS APPOINMENT       If you need a refill on your cardiac medications before your next appointment, please call your pharmacy.      Signed, William Dawley, MD  03/26/2018 11:26 AM    [address], [place], [place]  [address] Phone: [phone]; Fax: [phone]

## 2018-03-26 NOTE — Patient Instructions (Signed)
Medication Instructions:   Your physician recommends that you continue on your current medications as directed. Please refer to the Current Medication list given to you today.    Testing/Procedures:  Your physician has requested that you have an echocardiogram. Echocardiography is a painless test that uses sound waves to create images of your heart. It provides your doctor with information about the size and shape of your heart and how well your heart's chambers and valves are working. This procedure takes approximately one hour. There are no restrictions for this procedure.  ECHO NEEDS TO BE SCHEDULED IN 3 MONTHS, A WEEK PRIOR TO HIS 3 MONTH FOLLOW-UP APPOINTMENT WITH [name]      Follow-Up:  3 MONTHS WITH DR NELSON--PLEASE SCHEDULE YOUR ECHO A WEEK PRIOR TO THIS APPOINMENT       If you need a refill on your cardiac medications before your next appointment, please call your pharmacy.

## 2018-04-01 ENCOUNTER — Encounter: Payer: Self-pay | Admitting: Pharmacist

## 2018-04-02 ENCOUNTER — Other Ambulatory Visit: Payer: Self-pay | Admitting: *Deleted

## 2018-04-02 DIAGNOSIS — C3492 Malignant neoplasm of unspecified part of left bronchus or lung: Secondary | ICD-10-CM

## 2018-04-03 ENCOUNTER — Inpatient Hospital Stay: Payer: No Typology Code available for payment source | Attending: Internal Medicine

## 2018-04-03 ENCOUNTER — Inpatient Hospital Stay: Payer: No Typology Code available for payment source

## 2018-04-03 ENCOUNTER — Encounter: Payer: Self-pay | Admitting: Internal Medicine

## 2018-04-03 ENCOUNTER — Telehealth: Payer: Self-pay | Admitting: Internal Medicine

## 2018-04-03 ENCOUNTER — Inpatient Hospital Stay (HOSPITAL_BASED_OUTPATIENT_CLINIC_OR_DEPARTMENT_OTHER): Payer: No Typology Code available for payment source | Admitting: Internal Medicine

## 2018-04-03 DIAGNOSIS — I10 Essential (primary) hypertension: Secondary | ICD-10-CM | POA: Diagnosis not present

## 2018-04-03 DIAGNOSIS — C3412 Malignant neoplasm of upper lobe, left bronchus or lung: Secondary | ICD-10-CM | POA: Diagnosis not present

## 2018-04-03 DIAGNOSIS — C3432 Malignant neoplasm of lower lobe, left bronchus or lung: Secondary | ICD-10-CM

## 2018-04-03 DIAGNOSIS — Z5111 Encounter for antineoplastic chemotherapy: Secondary | ICD-10-CM | POA: Insufficient documentation

## 2018-04-03 DIAGNOSIS — R21 Rash and other nonspecific skin eruption: Secondary | ICD-10-CM

## 2018-04-03 DIAGNOSIS — C3490 Malignant neoplasm of unspecified part of unspecified bronchus or lung: Secondary | ICD-10-CM

## 2018-04-03 DIAGNOSIS — Z5112 Encounter for antineoplastic immunotherapy: Secondary | ICD-10-CM | POA: Diagnosis present

## 2018-04-03 DIAGNOSIS — C3492 Malignant neoplasm of unspecified part of left bronchus or lung: Secondary | ICD-10-CM

## 2018-04-03 DIAGNOSIS — C7951 Secondary malignant neoplasm of bone: Secondary | ICD-10-CM | POA: Diagnosis not present

## 2018-04-03 LAB — TSH: TSH: 1.103 u[IU]/mL (ref 0.320–4.118)

## 2018-04-03 LAB — CMP (CANCER CENTER ONLY)
ALT: 38 U/L (ref 0–44)
AST: 49 U/L — ABNORMAL HIGH (ref 15–41)
Albumin: 3.6 g/dL (ref 3.5–5.0)
Alkaline Phosphatase: 102 U/L (ref 38–126)
Anion gap: 10 (ref 5–15)
BUN: 9 mg/dL (ref 8–23)
CO2: 21 mmol/L — ABNORMAL LOW (ref 22–32)
Calcium: 9 mg/dL (ref 8.9–10.3)
Chloride: 109 mmol/L (ref 98–111)
Creatinine: 1.39 mg/dL — ABNORMAL HIGH (ref 0.61–1.24)
GFR, Est AFR Am: >60 mL/min (ref >60)
GFR, Estimated: 53 mL/min — ABNORMAL LOW (ref >60)
Glucose, Bld: 139 mg/dL — ABNORMAL HIGH (ref 70–99)
Potassium: 4.2 mmol/L (ref 3.5–5.1)
Sodium: 140 mmol/L (ref 135–145)
Total Bilirubin: 0.4 mg/dL (ref 0.3–1.2)
Total Protein: 7.4 g/dL (ref 6.5–8.1)

## 2018-04-03 LAB — CBC WITH DIFFERENTIAL (CANCER CENTER ONLY)
Abs Immature Granulocytes: 0.03 10*3/uL (ref 0.00–0.07)
Basophils Absolute: 0 10*3/uL (ref 0.0–0.1)
Basophils Relative: 1 %
Eosinophils Absolute: 0.1 10*3/uL (ref 0.0–0.5)
Eosinophils Relative: 2 %
HCT: 34.4 % — ABNORMAL LOW (ref 39.0–52.0)
Hemoglobin: 11.3 g/dL — ABNORMAL LOW (ref 13.0–17.0)
Immature Granulocytes: 0 %
Lymphocytes Relative: 15 %
Lymphs Abs: 1 10*3/uL (ref 0.7–4.0)
MCH: 34 pg (ref 26.0–34.0)
MCHC: 32.8 g/dL (ref 30.0–36.0)
MCV: 103.6 fL — ABNORMAL HIGH (ref 80.0–100.0)
Monocytes Absolute: 1 10*3/uL (ref 0.1–1.0)
Monocytes Relative: 15 %
NRBC: 0 % (ref 0.0–0.2)
Neutro Abs: 4.7 10*3/uL (ref 1.7–7.7)
Neutrophils Relative %: 67 %
Platelet Count: 435 10*3/uL — ABNORMAL HIGH (ref 150–400)
RBC: 3.32 MIL/uL — ABNORMAL LOW (ref 4.22–5.81)
RDW: 20.8 % — ABNORMAL HIGH (ref 11.5–15.5)
WBC Count: 7 10*3/uL (ref 4.0–10.5)

## 2018-04-03 MED ORDER — DEXAMETHASONE SODIUM PHOSPHATE 10 MG/ML IJ SOLN
10.0000 mg | Freq: Once | INTRAMUSCULAR | Status: AC
  Administered 2018-04-03: 10 mg via INTRAVENOUS

## 2018-04-03 MED ORDER — DEXAMETHASONE SODIUM PHOSPHATE 10 MG/ML IJ SOLN
INTRAMUSCULAR | Status: AC
  Filled 2018-04-03: qty 1

## 2018-04-03 MED ORDER — SODIUM CHLORIDE 0.9 % IV SOLN
200.0000 mg | Freq: Once | INTRAVENOUS | Status: AC
  Administered 2018-04-03: 200 mg via INTRAVENOUS
  Filled 2018-04-03: qty 8

## 2018-04-03 MED ORDER — SODIUM CHLORIDE 0.9 % IV SOLN
Freq: Once | INTRAVENOUS | Status: AC
  Administered 2018-04-03: 13:00:00 via INTRAVENOUS
  Filled 2018-04-03: qty 250

## 2018-04-03 MED ORDER — CYANOCOBALAMIN 1000 MCG/ML IJ SOLN
1000.0000 ug | Freq: Once | INTRAMUSCULAR | Status: AC
  Administered 2018-04-03: 1000 ug via INTRAMUSCULAR

## 2018-04-03 MED ORDER — CYANOCOBALAMIN 1000 MCG/ML IJ SOLN
INTRAMUSCULAR | Status: AC
  Filled 2018-04-03: qty 1

## 2018-04-03 MED ORDER — SODIUM CHLORIDE 0.9 % IV SOLN
Freq: Once | INTRAVENOUS | Status: DC
  Filled 2018-04-03: qty 250

## 2018-04-03 MED ORDER — ONDANSETRON HCL 4 MG/2ML IJ SOLN
INTRAMUSCULAR | Status: AC
  Filled 2018-04-03: qty 4

## 2018-04-03 MED ORDER — ONDANSETRON HCL 4 MG/2ML IJ SOLN
8.0000 mg | Freq: Once | INTRAMUSCULAR | Status: AC
  Administered 2018-04-03: 8 mg via INTRAVENOUS

## 2018-04-03 MED ORDER — SODIUM CHLORIDE 0.9 % IV SOLN
520.0000 mg/m2 | Freq: Once | INTRAVENOUS | Status: AC
  Administered 2018-04-03: 1000 mg via INTRAVENOUS
  Filled 2018-04-03: qty 40

## 2018-04-03 NOTE — Patient Instructions (Signed)
[facility] Discharge Instructions for Patients Receiving Chemotherapy  Today you received the following chemotherapy agents Keytruda and Alimta.  To help prevent nausea and vomiting after your treatment, we encourage you to take your nausea medication as directed.  If you develop nausea and vomiting that is not controlled by your nausea medication, call the clinic.   BELOW ARE SYMPTOMS THAT SHOULD BE REPORTED IMMEDIATELY:  *FEVER GREATER THAN 100.5 F  *CHILLS WITH OR WITHOUT FEVER  NAUSEA AND VOMITING THAT IS NOT CONTROLLED WITH YOUR NAUSEA MEDICATION  *UNUSUAL SHORTNESS OF BREATH  *UNUSUAL BRUISING OR BLEEDING  TENDERNESS IN MOUTH AND THROAT WITH OR WITHOUT PRESENCE OF ULCERS  *URINARY PROBLEMS  *BOWEL PROBLEMS  UNUSUAL RASH Items with * indicate a potential emergency and should be followed up as soon as possible.  Feel free to call the clinic should you have any questions or concerns. The clinic phone number is [phone].  Please show the [facility] at check-in to the Emergency Department and triage nurse.

## 2018-04-03 NOTE — Telephone Encounter (Signed)
Printed calendar and avs. °

## 2018-04-03 NOTE — Progress Notes (Signed)
[facility] [phone]   [phone]  OFFICE PROGRESS NOTE  Patient, No Pcp Per No address on file  DIAGNOSIS: Metastatic non-small cell lung cancer, adenocarcinoma initially diagnosed as stage IB (T2a, N0, M0) non-small cell lung cancer, adenocarcinoma diagnosed in December 2017.  The patient has evidence for disease recurrence in July 2019.  Biomarker Findings Tumor Mutational Burden - TMB-Intermediate (16 Muts/Mb) Microsatellite status - MS-Stable Genomic Findings For a complete list of the genes assayed, please refer to the Appendix. KIT amplification KRAS O03T SMAD4 splice site 5974-1U>L AG53 I232F 7 Disease relevant genes with no reportable alterations: EGFR, ALK, BRAF, MET, RET, ERBB2, ROS1   PDL 1 expression is 0%  PRIOR THERAPY:  1) status post left lower lobectomy as well as wedge resection of the left upper lobe on 05/11/2016. 2) Adjuvant systemic chemotherapy with cisplatin 75 MG/M2 and Alimta 500 MG/M2 every 3 weeks. First dose 07/03/2016. Status post 4 cycles.  CURRENT THERAPY: Systemic chemotherapy with carboplatin for AUC of 5, Alimta 500 mg/M2 and Keytruda 200 mg IV every 3 weeks.  Status post 5 cycles.  Starting from cycle #5 the patient will be treated with maintenance Alimta and Ketruda (pembrolizumab) every 3 weeks.  INTERVAL HISTORY: [name] 62 y.o. male returns to the clinic today for follow-up visit accompanied by his wife.  The patient is feeling fine today with no concerning complaints except for the mild skin rash on the upper extremities likely from immunotherapy.  The patient denied having any chest pain, shortness of breath, cough or hemoptysis.  He denied having any fever or chills.  He has no nausea, vomiting, diarrhea or constipation.  He denied having any headache or visual changes.  He is here today for evaluation before starting cycle #6 of his treatment.   MEDICAL HISTORY: Past Medical  History:  Diagnosis Date  . AAA (abdominal aortic aneurysm) ([place])   . AAA (abdominal aortic aneurysm) without rupture ([place]) 02/04/2014  . Cancer ([facility])    lung  . Encounter for antineoplastic chemotherapy 06/06/2016  . History of hepatitis B   . HNP (herniated nucleus pulposus), lumbar    L4 with radiculopathy  . Hypertension   . Lung cancer ([place]) 05/18/2016  . Malignant neoplasm of lower lobe of left lung ([place] [facility]) 04/05/2016  . Mass of left lung   . Mitral insufficiency 04/06/2016   This patient will eventually need MV repair if his prognosis is good from oncology standpoint. However, his lung cancer therapy  is the priority right now. His cardiac condition won't preclude possible lung surgery.  Once his lung cancer is under control he will need a TEE to further evaluate his mitral valve anatomy and MR severity, and also have ischemic workup as tere is evidence on calcificati  . Renal insufficiency 10/09/2016  . S/P partial lobectomy of lung 05/11/2016  . Varicose veins of legs     ALLERGIES:  is allergic to tramadol.  MEDICATIONS:  Current Outpatient Medications  Medication Sig Dispense Refill  . folic acid (FOLVITE) 1 MG tablet Take 1 tablet (1 mg total) by mouth daily. 30 tablet 4  . losartan (COZAAR) 25 MG tablet TAKE 1 TABLET BY MOUTH EVERY DAY 30 tablet 0   No current facility-administered medications for this visit.     SURGICAL HISTORY:  Past Surgical History:  Procedure Laterality Date  . ABDOMINAL AORTIC ENDOVASCULAR STENT GRAFT N/A 03/12/2016   Procedure: ABDOMINAL AORTIC ENDOVASCULAR  STENT GRAFT;  Surgeon: Conrad Farmington, MD;  Location: [facility];  Service: Vascular;  Laterality: N/A;  . COLONOSCOPY    . INGUINAL HERNIA REPAIR Left 1975   Left inguinal hernia  . INGUINAL HERNIA REPAIR Right   . LOBECTOMY Left 05/11/2016   Procedure: LEFT LOWER LOBE LOBECTOMY AND LEFT UPPER LOBE  RESECTION AND PLACEMENT OF ON-Q;  Surgeon: [name], MD;  Location: [facility];  Service:  Thoracic;  Laterality: Left;  . LUMBAR LAMINECTOMY  October 22, 2012  . LYMPH NODE DISSECTION Left 05/11/2016   Procedure: LYMPH NODE DISSECTION;  Surgeon: [name], MD;  Location: [facility];  Service: Thoracic;  Laterality: Left;  . STAPLING OF BLEBS Left 05/11/2016   Procedure: STAPLING OF APICAL BLEB;  Surgeon: [name], MD;  Location: [facility];  Service: Thoracic;  Laterality: Left;  Marland Kitchen VIDEO ASSISTED THORACOSCOPY Left 05/18/2016   Procedure: VIDEO ASSISTED THORACOSCOPY WITH REMOVAL OF LEFT APICAL BLEB;  Surgeon: [name], MD;  Location: [facility];  Service: Thoracic;  Laterality: Left;  Marland Kitchen VIDEO ASSISTED THORACOSCOPY (VATS)/WEDGE RESECTION Left 05/11/2016   Procedure: LEFT VIDEO ASSISTED THORACOSCOPY (VATS);  Surgeon: [name], MD;  Location: [facility];  Service: Thoracic;  Laterality: Left;  Marland Kitchen VIDEO BRONCHOSCOPY N/A 05/11/2016   Procedure: VIDEO BRONCHOSCOPY, LEFT LUNG;  Surgeon: [name], MD;  Location: [facility];  Service: Thoracic;  Laterality: N/A;  . VIDEO BRONCHOSCOPY N/A 05/18/2016   Procedure: VIDEO BRONCHOSCOPY WITH BRONCHIAL WASHING;  Surgeon: [name], MD;  Location: [facility];  Service: Thoracic;  Laterality: N/A;    REVIEW OF SYSTEMS:  A comprehensive review of systems was negative except for: Integument/breast: positive for rash   PHYSICAL EXAMINATION: General appearance: alert, cooperative and no distress Head: Normocephalic, without obvious abnormality, atraumatic Neck: no adenopathy, no JVD, supple, symmetrical, trachea midline and thyroid not enlarged, symmetric, no tenderness/mass/nodules Lymph nodes: Cervical, supraclavicular, and axillary nodes normal. Resp: clear to auscultation bilaterally Back: symmetric, no curvature. ROM normal. No CVA tenderness. Cardio: regular rate and rhythm, S1, S2 normal, no murmur, click, rub or gallop GI: soft, non-tender; bowel sounds normal; no masses,  no organomegaly Extremities: extremities normal, atraumatic,  no cyanosis or edema   ECOG PERFORMANCE STATUS: 1 - Symptomatic but completely ambulatory  Blood pressure (!) 150/85, pulse 95, temperature 98.6 F (37 C), temperature source Oral, resp. rate 18, height 6' 3" (1.905 m), weight 160 lb 1.6 oz (72.6 kg), SpO2 100 %.  LABORATORY DATA: Lab Results  Component Value Date   WBC 7.0 04/03/2018   HGB 11.3 (L) 04/03/2018   HCT 34.4 (L) 04/03/2018   MCV 103.6 (H) 04/03/2018   PLT 435 (H) 04/03/2018      Chemistry      Component Value Date/Time   NA 140 04/03/2018 1119   NA 138 01/11/2017 1343   K 4.2 04/03/2018 1119   K 4.7 01/11/2017 1343   CL 109 04/03/2018 1119   CO2 21 (L) 04/03/2018 1119   CO2 23 01/11/2017 1343   BUN 9 04/03/2018 1119   BUN 20.2 01/11/2017 1343   CREATININE 1.39 (H) 04/03/2018 1119   CREATININE 1.6 (H) 01/11/2017 1343      Component Value Date/Time   CALCIUM 9.0 04/03/2018 1119   CALCIUM 9.3 01/11/2017 1343   ALKPHOS 102 04/03/2018 1119   ALKPHOS 89 01/11/2017 1343   AST 49 (H) 04/03/2018 1119   AST 41 (H) 01/11/2017 1343   ALT 38 04/03/2018  1119   ALT 27 01/11/2017 1343   BILITOT 0.4 04/03/2018 1119   BILITOT 0.49 01/11/2017 1343       RADIOGRAPHIC STUDIES: No results found.  ASSESSMENT AND PLAN:  This is a very pleasant 62 years old white male with a stage IB non-small cell lung cancer, adenocarcinoma status post wedge resection of the left upper lobe followed by 4 cycles of adjuvant systemic chemotherapy with cisplatin and Alimta and he tolerated his treatment well except for fatigue. The patient has been on observation since June 2018.   The recent imaging studies including CT scan of the chest as well as a PET scan showed evidence for disease recurrence with metastatic disease presented with bilateral pulmonary nodules as well as destructive bone lesions at T4 vertebral body, biopsy proven to be metastatic adenocarcinoma. Molecular studies by foundation 1 showed no actionable mutations.  PDL 1  expression was negative. The patient is currently undergoing treatment with carboplatin, Alimta and Ketruda (pembrolizumab) status post 5 cycles. Starting from cycle #5 the patient will be treated with maintenance Alimta and Ketruda (pembrolizumab) every 3 weeks. The patient has been tolerating this treatment well with no concerning adverse effects. I recommended for him to proceed with cycle #6 today as scheduled. I will see the patient back for follow-up visit in 3 weeks for evaluation after repeating CT scan of the chest, abdomen and pelvis for restaging of his disease. The patient was advised to call immediately if he has any concerning symptoms in the interval. The patient voices understanding of current disease status and treatment options and is in agreement with the current care plan. All questions were answered. The patient knows to call the clinic with any problems, questions or concerns. We can certainly see the patient much sooner if necessary.  Disclaimer: This note was dictated with voice recognition software. Similar sounding words can inadvertently be transcribed and may not be corrected upon review.

## 2018-04-08 ENCOUNTER — Other Ambulatory Visit: Payer: Self-pay | Admitting: Cardiology

## 2018-04-17 ENCOUNTER — Ambulatory Visit (HOSPITAL_COMMUNITY): Payer: No Typology Code available for payment source

## 2018-04-17 ENCOUNTER — Telehealth: Payer: Self-pay | Admitting: Medical Oncology

## 2018-04-17 NOTE — Telephone Encounter (Signed)
Pt did not do scan due to financial issue.

## 2018-04-17 NOTE — Telephone Encounter (Signed)
yes

## 2018-04-22 ENCOUNTER — Other Ambulatory Visit: Payer: Self-pay | Admitting: Medical Oncology

## 2018-04-22 DIAGNOSIS — C3490 Malignant neoplasm of unspecified part of unspecified bronchus or lung: Secondary | ICD-10-CM

## 2018-04-24 ENCOUNTER — Telehealth: Payer: Self-pay

## 2018-04-24 ENCOUNTER — Inpatient Hospital Stay: Payer: No Typology Code available for payment source

## 2018-04-24 ENCOUNTER — Inpatient Hospital Stay: Payer: No Typology Code available for payment source | Attending: Internal Medicine

## 2018-04-24 ENCOUNTER — Encounter: Payer: Self-pay | Admitting: Internal Medicine

## 2018-04-24 ENCOUNTER — Inpatient Hospital Stay (HOSPITAL_BASED_OUTPATIENT_CLINIC_OR_DEPARTMENT_OTHER): Payer: No Typology Code available for payment source | Admitting: Internal Medicine

## 2018-04-24 VITALS — BP 154/82 | HR 74 | Temp 98.2°F | Resp 18 | Ht 75.0 in | Wt 159.4 lb

## 2018-04-24 DIAGNOSIS — C3412 Malignant neoplasm of upper lobe, left bronchus or lung: Secondary | ICD-10-CM

## 2018-04-24 DIAGNOSIS — Z5111 Encounter for antineoplastic chemotherapy: Secondary | ICD-10-CM | POA: Diagnosis not present

## 2018-04-24 DIAGNOSIS — Z5112 Encounter for antineoplastic immunotherapy: Secondary | ICD-10-CM | POA: Diagnosis present

## 2018-04-24 DIAGNOSIS — C7951 Secondary malignant neoplasm of bone: Secondary | ICD-10-CM

## 2018-04-24 DIAGNOSIS — C3492 Malignant neoplasm of unspecified part of left bronchus or lung: Secondary | ICD-10-CM

## 2018-04-24 DIAGNOSIS — C3432 Malignant neoplasm of lower lobe, left bronchus or lung: Secondary | ICD-10-CM

## 2018-04-24 DIAGNOSIS — C7801 Secondary malignant neoplasm of right lung: Secondary | ICD-10-CM | POA: Diagnosis not present

## 2018-04-24 DIAGNOSIS — I10 Essential (primary) hypertension: Secondary | ICD-10-CM

## 2018-04-24 DIAGNOSIS — Z9221 Personal history of antineoplastic chemotherapy: Secondary | ICD-10-CM | POA: Diagnosis not present

## 2018-04-24 DIAGNOSIS — C7802 Secondary malignant neoplasm of left lung: Secondary | ICD-10-CM | POA: Diagnosis not present

## 2018-04-24 DIAGNOSIS — J189 Pneumonia, unspecified organism: Secondary | ICD-10-CM | POA: Insufficient documentation

## 2018-04-24 DIAGNOSIS — C3490 Malignant neoplasm of unspecified part of unspecified bronchus or lung: Secondary | ICD-10-CM

## 2018-04-24 LAB — CBC WITH DIFFERENTIAL (CANCER CENTER ONLY)
Abs Immature Granulocytes: 0.03 10*3/uL (ref 0.00–0.07)
Basophils Absolute: 0 10*3/uL (ref 0.0–0.1)
Basophils Relative: 1 %
Eosinophils Absolute: 0.2 10*3/uL (ref 0.0–0.5)
Eosinophils Relative: 3 %
HEMATOCRIT: 38.7 % — AB (ref 39.0–52.0)
Hemoglobin: 12.3 g/dL — ABNORMAL LOW (ref 13.0–17.0)
Immature Granulocytes: 0 %
Lymphocytes Relative: 17 %
Lymphs Abs: 1.2 10*3/uL (ref 0.7–4.0)
MCH: 34.6 pg — AB (ref 26.0–34.0)
MCHC: 31.8 g/dL (ref 30.0–36.0)
MCV: 109 fL — ABNORMAL HIGH (ref 80.0–100.0)
MONO ABS: 1.3 10*3/uL — AB (ref 0.1–1.0)
Monocytes Relative: 19 %
Neutro Abs: 4.2 10*3/uL (ref 1.7–7.7)
Neutrophils Relative %: 60 %
Platelet Count: 411 10*3/uL — ABNORMAL HIGH (ref 150–400)
RBC: 3.55 MIL/uL — ABNORMAL LOW (ref 4.22–5.81)
RDW: 15.8 % — ABNORMAL HIGH (ref 11.5–15.5)
WBC Count: 6.9 10*3/uL (ref 4.0–10.5)
nRBC: 0 % (ref 0.0–0.2)

## 2018-04-24 LAB — CMP (CANCER CENTER ONLY)
ALT: 52 U/L — AB (ref 0–44)
AST: 68 U/L — ABNORMAL HIGH (ref 15–41)
Albumin: 3.8 g/dL (ref 3.5–5.0)
Alkaline Phosphatase: 108 U/L (ref 38–126)
Anion gap: 10 (ref 5–15)
BUN: 12 mg/dL (ref 8–23)
CALCIUM: 9.3 mg/dL (ref 8.9–10.3)
CO2: 20 mmol/L — ABNORMAL LOW (ref 22–32)
Chloride: 104 mmol/L (ref 98–111)
Creatinine: 1.6 mg/dL — ABNORMAL HIGH (ref 0.61–1.24)
GFR, Est AFR Am: 52 mL/min — ABNORMAL LOW (ref >60)
GFR, Estimated: 45 mL/min — ABNORMAL LOW (ref >60)
Glucose, Bld: 105 mg/dL — ABNORMAL HIGH (ref 70–99)
Potassium: 4 mmol/L (ref 3.5–5.1)
Sodium: 134 mmol/L — ABNORMAL LOW (ref 135–145)
Total Bilirubin: 0.5 mg/dL (ref 0.3–1.2)
Total Protein: 7.8 g/dL (ref 6.5–8.1)

## 2018-04-24 LAB — TSH: TSH: 2.134 u[IU]/mL (ref 0.320–4.118)

## 2018-04-24 MED ORDER — SODIUM CHLORIDE 0.9 % IV SOLN
Freq: Once | INTRAVENOUS | Status: AC
  Administered 2018-04-24: 11:00:00 via INTRAVENOUS
  Filled 2018-04-24: qty 250

## 2018-04-24 MED ORDER — ONDANSETRON HCL 4 MG/2ML IJ SOLN
INTRAMUSCULAR | Status: AC
  Filled 2018-04-24: qty 4

## 2018-04-24 MED ORDER — ONDANSETRON HCL 4 MG/2ML IJ SOLN
8.0000 mg | Freq: Once | INTRAMUSCULAR | Status: AC
  Administered 2018-04-24: 8 mg via INTRAVENOUS

## 2018-04-24 MED ORDER — DEXAMETHASONE SODIUM PHOSPHATE 10 MG/ML IJ SOLN
10.0000 mg | Freq: Once | INTRAMUSCULAR | Status: AC
  Administered 2018-04-24: 10 mg via INTRAVENOUS

## 2018-04-24 MED ORDER — SODIUM CHLORIDE 0.9 % IV SOLN
520.0000 mg/m2 | Freq: Once | INTRAVENOUS | Status: AC
  Administered 2018-04-24: 1000 mg via INTRAVENOUS
  Filled 2018-04-24: qty 40

## 2018-04-24 MED ORDER — DEXAMETHASONE SODIUM PHOSPHATE 10 MG/ML IJ SOLN
INTRAMUSCULAR | Status: AC
  Filled 2018-04-24: qty 1

## 2018-04-24 MED ORDER — SODIUM CHLORIDE 0.9 % IV SOLN
200.0000 mg | Freq: Once | INTRAVENOUS | Status: AC
  Administered 2018-04-24: 200 mg via INTRAVENOUS
  Filled 2018-04-24: qty 8

## 2018-04-24 NOTE — Progress Notes (Signed)
Per Dr. Julien Nordmann, [facility] to tx with creatinine 1.6 04/24/17.

## 2018-04-24 NOTE — Patient Instructions (Signed)
[facility] Discharge Instructions for Patients Receiving Chemotherapy  Today you received the following chemotherapy agents: Keytruda and Alimta.  To help prevent nausea and vomiting after your treatment, we encourage you to take your nausea medication as directed.  If you develop nausea and vomiting that is not controlled by your nausea medication, call the clinic.   BELOW ARE SYMPTOMS THAT SHOULD BE REPORTED IMMEDIATELY:  *FEVER GREATER THAN 100.5 F  *CHILLS WITH OR WITHOUT FEVER  NAUSEA AND VOMITING THAT IS NOT CONTROLLED WITH YOUR NAUSEA MEDICATION  *UNUSUAL SHORTNESS OF BREATH  *UNUSUAL BRUISING OR BLEEDING  TENDERNESS IN MOUTH AND THROAT WITH OR WITHOUT PRESENCE OF ULCERS  *URINARY PROBLEMS  *BOWEL PROBLEMS  UNUSUAL RASH Items with * indicate a potential emergency and should be followed up as soon as possible.  Feel free to call the clinic should you have any questions or concerns. The clinic phone number is [phone].  Please show the [facility] at check-in to the Emergency Department and triage nurse.

## 2018-04-24 NOTE — Telephone Encounter (Signed)
Printed avs and calender of upcoming appointment. Per 1/2 los 

## 2018-04-24 NOTE — Progress Notes (Signed)
[facility] Telephone:([phone]   [phone]  OFFICE PROGRESS NOTE  Patient, No Pcp Per No address on file  DIAGNOSIS: Metastatic non-small cell lung cancer, adenocarcinoma initially diagnosed as stage IB (T2a, N0, M0) non-small cell lung cancer, adenocarcinoma diagnosed in December 2017.  The patient has evidence for disease recurrence in July 2019.  Biomarker Findings Tumor Mutational Burden - TMB-Intermediate (16 Muts/Mb) Microsatellite status - MS-Stable Genomic Findings For a complete list of the genes assayed, please refer to the Appendix. KIT amplification KRAS D98P SMAD4 splice site [id number]>L ZJ67 I232F 7 Disease relevant genes with no reportable alterations: EGFR, ALK, BRAF, MET, RET, ERBB2, ROS1   PDL 1 expression is 0%  PRIOR THERAPY:  1) status post left lower lobectomy as well as wedge resection of the left upper lobe on 05/11/2016. 2) Adjuvant systemic chemotherapy with cisplatin 75 MG/M2 and Alimta 500 MG/M2 every 3 weeks. First dose 07/03/2016. Status post 4 cycles.  CURRENT THERAPY: Systemic chemotherapy with carboplatin for AUC of 5, Alimta 500 mg/M2 and Keytruda 200 mg IV every 3 weeks.  Status post 6 cycles.  Starting from cycle #5 the patient will be treated with maintenance Alimta and Ketruda (pembrolizumab) every 3 weeks.  INTERVAL HISTORY: William Kales [name] 63 y.o. male returns to the clinic today for follow-up visit.  The patient is feeling fine today with no specific complaints.  Unfortunately he was laid off his job recently and lost his health and life insurance.  He still covered by his wife's health insurance.  The patient was supposed to have repeat CT scan of the chest before this visit but because of these insurance issues it was not done.  He is feeling fine today with no concerning complaints.  He denied having any chest pain, shortness of breath, cough or hemoptysis.  He denied having any fever or chills.  He has  no nausea, vomiting, diarrhea or constipation.  He is here today for evaluation before starting cycle #7 of his treatment.   MEDICAL HISTORY: Past Medical History:  Diagnosis Date  . AAA (abdominal aortic aneurysm) ([facility])   . AAA (abdominal aortic aneurysm) without rupture ([place] [place]) 02/04/2014  . Cancer ([place])    lung  . Encounter for antineoplastic chemotherapy 06/06/2016  . History of hepatitis B   . HNP (herniated nucleus pulposus), lumbar    L4 with radiculopathy  . Hypertension   . Lung cancer ([place]) 05/18/2016  . Malignant neoplasm of lower lobe of left lung ([place]) 04/05/2016  . Mass of left lung   . Mitral insufficiency 04/06/2016   This patient will eventually need MV repair if his prognosis is good from oncology standpoint. However, his lung cancer therapy  is the priority right now. His cardiac condition won't preclude possible lung surgery.  Once his lung cancer is under control he will need a TEE to further evaluate his mitral valve anatomy and MR severity, and also have ischemic workup as tere is evidence on calcificati  . Renal insufficiency 10/09/2016  . S/P partial lobectomy of lung 05/11/2016  . Varicose veins of legs     ALLERGIES:  is allergic to tramadol.  MEDICATIONS:  Current Outpatient Medications  Medication Sig Dispense Refill  . folic acid (FOLVITE) 1 MG tablet Take 1 tablet (1 mg total) by mouth daily. 30 tablet 4  . losartan (COZAAR) 25 MG tablet TAKE 1 TABLET BY MOUTH EVERY DAY 90 tablet 3   No current  facility-administered medications for this visit.     SURGICAL HISTORY:  Past Surgical History:  Procedure Laterality Date  . ABDOMINAL AORTIC ENDOVASCULAR STENT GRAFT N/A 03/12/2016   Procedure: ABDOMINAL AORTIC ENDOVASCULAR STENT GRAFT;  Surgeon: Conrad Buchanan Dam, MD;  Location: [facility];  Service: Vascular;  Laterality: N/A;  . COLONOSCOPY    . INGUINAL HERNIA REPAIR Left 1975   Left inguinal hernia  . INGUINAL HERNIA REPAIR Right   . LOBECTOMY Left 05/11/2016     Procedure: LEFT LOWER LOBE LOBECTOMY AND LEFT UPPER LOBE  RESECTION AND PLACEMENT OF ON-Q;  Surgeon: [name], MD;  Location: [facility];  Service: Thoracic;  Laterality: Left;  . LUMBAR LAMINECTOMY  October 22, 2012  . LYMPH NODE DISSECTION Left 05/11/2016   Procedure: LYMPH NODE DISSECTION;  Surgeon: [name], MD;  Location: [facility];  Service: Thoracic;  Laterality: Left;  . STAPLING OF BLEBS Left 05/11/2016   Procedure: STAPLING OF APICAL BLEB;  Surgeon: [name], MD;  Location: [facility];  Service: Thoracic;  Laterality: Left;  Marland Kitchen VIDEO ASSISTED THORACOSCOPY Left 05/18/2016   Procedure: VIDEO ASSISTED THORACOSCOPY WITH REMOVAL OF LEFT APICAL BLEB;  Surgeon: [name], MD;  Location: [facility];  Service: Thoracic;  Laterality: Left;  Marland Kitchen VIDEO ASSISTED THORACOSCOPY (VATS)/WEDGE RESECTION Left 05/11/2016   Procedure: LEFT VIDEO ASSISTED THORACOSCOPY (VATS);  Surgeon: [name], MD;  Location: [facility];  Service: Thoracic;  Laterality: Left;  Marland Kitchen VIDEO BRONCHOSCOPY N/A 05/11/2016   Procedure: VIDEO BRONCHOSCOPY, LEFT LUNG;  Surgeon: [name], MD;  Location: [facility];  Service: Thoracic;  Laterality: N/A;  . VIDEO BRONCHOSCOPY N/A 05/18/2016   Procedure: VIDEO BRONCHOSCOPY WITH BRONCHIAL WASHING;  Surgeon: [name], MD;  Location: [facility];  Service: Thoracic;  Laterality: N/A;    REVIEW OF SYSTEMS:  A comprehensive review of systems was negative except for: Constitutional: positive for fatigue Integument/breast: positive for rash   PHYSICAL EXAMINATION: General appearance: alert, cooperative and no distress Head: Normocephalic, without obvious abnormality, atraumatic Neck: no adenopathy, no JVD, supple, symmetrical, trachea midline and thyroid not enlarged, symmetric, no tenderness/mass/nodules Lymph nodes: Cervical, supraclavicular, and axillary nodes normal. Resp: clear to auscultation bilaterally Back: symmetric, no curvature. ROM normal. No CVA  tenderness. Cardio: regular rate and rhythm, S1, S2 normal, no murmur, click, rub or gallop GI: soft, non-tender; bowel sounds normal; no masses,  no organomegaly Extremities: extremities normal, atraumatic, no cyanosis or edema   ECOG PERFORMANCE STATUS: 1 - Symptomatic but completely ambulatory  Blood pressure (!) 154/82, pulse 74, temperature 98.2 F (36.8 C), temperature source Oral, resp. rate 18, height _0  (1.905 m), weight 159 lb 6.4 oz (72.3 kg), SpO2 100 %.  LABORATORY DATA: Lab Results  Component Value Date   WBC 6.9 04/24/2018   HGB 12.3 (L) 04/24/2018   HCT 38.7 (L) 04/24/2018   MCV 109.0 (H) 04/24/2018   PLT 411 (H) 04/24/2018      Chemistry      Component Value Date/Time   NA 140 04/03/2018 1119   NA 138 01/11/2017 1343   K 4.2 04/03/2018 1119   K 4.7 01/11/2017 1343   CL 109 04/03/2018 1119   CO2 21 (L) 04/03/2018 1119   CO2 23 01/11/2017 1343   BUN 9 04/03/2018 1119   BUN 20.2 01/11/2017 1343   CREATININE 1.39 (H) 04/03/2018 1119   CREATININE 1.6 (H) 01/11/2017 1343      Component Value Date/Time   CALCIUM 9.0  04/03/2018 1119   CALCIUM 9.3 01/11/2017 1343   ALKPHOS 102 04/03/2018 1119   ALKPHOS 89 01/11/2017 1343   AST 49 (H) 04/03/2018 1119   AST 41 (H) 01/11/2017 1343   ALT 38 04/03/2018 1119   ALT 27 01/11/2017 1343   BILITOT 0.4 04/03/2018 1119   BILITOT 0.49 01/11/2017 1343       RADIOGRAPHIC STUDIES: No results found.  ASSESSMENT AND PLAN:  This is a very pleasant 63 years old white male with a stage IB non-small cell lung cancer, adenocarcinoma status post wedge resection of the left upper lobe followed by 4 cycles of adjuvant systemic chemotherapy with cisplatin and Alimta and he tolerated his treatment well except for fatigue. The patient has been on observation since June 2018.   The recent imaging studies including CT scan of the chest as well as a PET scan showed evidence for disease recurrence with metastatic disease presented  with bilateral pulmonary nodules as well as destructive bone lesions at T4 vertebral body, biopsy proven to be metastatic adenocarcinoma. Molecular studies by foundation 1 showed no actionable mutations.  PDL 1 expression was negative. The patient is currently undergoing treatment with carboplatin, Alimta and Ketruda (pembrolizumab) status post 6 cycles. Starting from cycle #5 the patient will be treated with maintenance Alimta and Ketruda (pembrolizumab) every 3 weeks. I recommended for the patient to proceed with cycle #7 today as scheduled. I will see him back for follow-up visit in 3 weeks for evaluation after repeating CT scan of the chest, abdomen and pelvis for restaging of his disease. The patient was advised to call immediately if he has any concerning symptoms in the interval. The patient voices understanding of current disease status and treatment options and is in agreement with the current care plan. All questions were answered. The patient knows to call the clinic with any problems, questions or concerns. We can certainly see the patient much sooner if necessary.  Disclaimer: This note was dictated with voice recognition software. Similar sounding words can inadvertently be transcribed and may not be corrected upon review.

## 2018-04-28 ENCOUNTER — Telehealth: Payer: Self-pay | Admitting: Internal Medicine

## 2018-04-28 NOTE — Telephone Encounter (Signed)
Scheduled appt per 1/2 los - pt to get an updated schedule next visit.

## 2018-04-29 ENCOUNTER — Ambulatory Visit (HOSPITAL_COMMUNITY): Admission: RE | Admit: 2018-04-29 | Payer: No Typology Code available for payment source | Source: Ambulatory Visit

## 2018-05-07 ENCOUNTER — Ambulatory Visit (HOSPITAL_COMMUNITY)
Admission: RE | Admit: 2018-05-07 | Discharge: 2018-05-07 | Disposition: A | Discharge status: Home / Self Care | Payer: No Typology Code available for payment source | Source: Ambulatory Visit | Attending: Internal Medicine | Admitting: Internal Medicine

## 2018-05-07 DIAGNOSIS — C3490 Malignant neoplasm of unspecified part of unspecified bronchus or lung: Secondary | ICD-10-CM | POA: Insufficient documentation

## 2018-05-07 MED ORDER — SODIUM CHLORIDE (PF) 0.9 % IJ SOLN
INTRAMUSCULAR | Status: AC
  Filled 2018-05-07: qty 50

## 2018-05-07 MED ORDER — IOHEXOL 300 MG/ML  SOLN
100.0000 mL | Freq: Once | INTRAMUSCULAR | Status: AC | PRN
  Administered 2018-05-07: 100 mL via INTRAVENOUS

## 2018-05-15 ENCOUNTER — Encounter: Payer: Self-pay | Admitting: Internal Medicine

## 2018-05-15 ENCOUNTER — Inpatient Hospital Stay: Payer: No Typology Code available for payment source

## 2018-05-15 ENCOUNTER — Other Ambulatory Visit: Payer: Self-pay | Admitting: *Deleted

## 2018-05-15 ENCOUNTER — Inpatient Hospital Stay (HOSPITAL_BASED_OUTPATIENT_CLINIC_OR_DEPARTMENT_OTHER): Payer: No Typology Code available for payment source | Admitting: Internal Medicine

## 2018-05-15 VITALS — BP 159/81 | HR 79 | Resp 16

## 2018-05-15 VITALS — BP 162/87 | HR 100 | Temp 98.6°F | Resp 18 | Ht 75.0 in | Wt 158.9 lb

## 2018-05-15 DIAGNOSIS — Z5111 Encounter for antineoplastic chemotherapy: Secondary | ICD-10-CM

## 2018-05-15 DIAGNOSIS — J189 Pneumonia, unspecified organism: Secondary | ICD-10-CM

## 2018-05-15 DIAGNOSIS — C3492 Malignant neoplasm of unspecified part of left bronchus or lung: Secondary | ICD-10-CM

## 2018-05-15 DIAGNOSIS — Z5112 Encounter for antineoplastic immunotherapy: Secondary | ICD-10-CM

## 2018-05-15 DIAGNOSIS — C3432 Malignant neoplasm of lower lobe, left bronchus or lung: Secondary | ICD-10-CM

## 2018-05-15 DIAGNOSIS — Z9221 Personal history of antineoplastic chemotherapy: Secondary | ICD-10-CM

## 2018-05-15 DIAGNOSIS — C3412 Malignant neoplasm of upper lobe, left bronchus or lung: Secondary | ICD-10-CM

## 2018-05-15 DIAGNOSIS — C7801 Secondary malignant neoplasm of right lung: Secondary | ICD-10-CM

## 2018-05-15 DIAGNOSIS — I10 Essential (primary) hypertension: Secondary | ICD-10-CM

## 2018-05-15 DIAGNOSIS — C7951 Secondary malignant neoplasm of bone: Secondary | ICD-10-CM | POA: Diagnosis not present

## 2018-05-15 DIAGNOSIS — C7802 Secondary malignant neoplasm of left lung: Secondary | ICD-10-CM

## 2018-05-15 LAB — CBC WITH DIFFERENTIAL (CANCER CENTER ONLY)
Abs Immature Granulocytes: 0.03 10*3/uL (ref 0.00–0.07)
BASOS PCT: 1 %
Basophils Absolute: 0.1 10*3/uL (ref 0.0–0.1)
Eosinophils Absolute: 0.2 10*3/uL (ref 0.0–0.5)
Eosinophils Relative: 3 %
HCT: 40.7 % (ref 39.0–52.0)
Hemoglobin: 13.2 g/dL (ref 13.0–17.0)
Immature Granulocytes: 0 %
Lymphocytes Relative: 17 %
Lymphs Abs: 1.2 10*3/uL (ref 0.7–4.0)
MCH: 34.8 pg — ABNORMAL HIGH (ref 26.0–34.0)
MCHC: 32.4 g/dL (ref 30.0–36.0)
MCV: 107.4 fL — ABNORMAL HIGH (ref 80.0–100.0)
Monocytes Absolute: 1.2 10*3/uL — ABNORMAL HIGH (ref 0.1–1.0)
Monocytes Relative: 16 %
NEUTROS PCT: 63 %
Neutro Abs: 4.6 10*3/uL (ref 1.7–7.7)
Platelet Count: 376 10*3/uL (ref 150–400)
RBC: 3.79 MIL/uL — ABNORMAL LOW (ref 4.22–5.81)
RDW: 14.1 % (ref 11.5–15.5)
WBC Count: 7.3 10*3/uL (ref 4.0–10.5)
nRBC: 0 % (ref 0.0–0.2)

## 2018-05-15 LAB — CMP (CANCER CENTER ONLY)
ALK PHOS: 101 U/L (ref 38–126)
ALT: 30 U/L (ref 0–44)
ANION GAP: 10 (ref 5–15)
AST: 45 U/L — ABNORMAL HIGH (ref 15–41)
Albumin: 3.3 g/dL — ABNORMAL LOW (ref 3.5–5.0)
BUN: 8 mg/dL (ref 8–23)
CALCIUM: 8.9 mg/dL (ref 8.9–10.3)
CO2: 19 mmol/L — ABNORMAL LOW (ref 22–32)
Chloride: 106 mmol/L (ref 98–111)
Creatinine: 1.41 mg/dL — ABNORMAL HIGH (ref 0.61–1.24)
GFR, Est AFR Am: >60 mL/min (ref >60)
GFR, Estimated: 52 mL/min — ABNORMAL LOW (ref >60)
Glucose, Bld: 115 mg/dL — ABNORMAL HIGH (ref 70–99)
Potassium: 3.8 mmol/L (ref 3.5–5.1)
Sodium: 135 mmol/L (ref 135–145)
TOTAL PROTEIN: 7.5 g/dL (ref 6.5–8.1)
Total Bilirubin: 0.3 mg/dL (ref 0.3–1.2)

## 2018-05-15 LAB — TSH: TSH: 2.847 u[IU]/mL (ref 0.320–4.118)

## 2018-05-15 MED ORDER — DOXYCYCLINE HYCLATE 100 MG PO TABS: 100.0000 mg | ORAL_TABLET | Freq: Two times a day (BID) | ORAL | 0 refills | Status: DC

## 2018-05-15 MED ORDER — SODIUM CHLORIDE 0.9 % IV SOLN
525.0000 mg/m2 | Freq: Once | INTRAVENOUS | Status: AC
  Administered 2018-05-15: 1000 mg via INTRAVENOUS
  Filled 2018-05-15: qty 40

## 2018-05-15 MED ORDER — SODIUM CHLORIDE 0.9 % IV SOLN
200.0000 mg | Freq: Once | INTRAVENOUS | Status: AC
  Administered 2018-05-15: 200 mg via INTRAVENOUS
  Filled 2018-05-15: qty 8

## 2018-05-15 MED ORDER — DEXAMETHASONE SODIUM PHOSPHATE 10 MG/ML IJ SOLN
INTRAMUSCULAR | Status: AC
  Filled 2018-05-15: qty 1

## 2018-05-15 MED ORDER — SODIUM CHLORIDE 0.9 % IV SOLN
Freq: Once | INTRAVENOUS | Status: AC
  Administered 2018-05-15: 10:00:00 via INTRAVENOUS
  Filled 2018-05-15: qty 250

## 2018-05-15 MED ORDER — DEXAMETHASONE SODIUM PHOSPHATE 10 MG/ML IJ SOLN
10.0000 mg | Freq: Once | INTRAMUSCULAR | Status: AC
  Administered 2018-05-15: 10 mg via INTRAVENOUS

## 2018-05-15 MED ORDER — ONDANSETRON HCL 4 MG/2ML IJ SOLN
INTRAMUSCULAR | Status: AC
  Filled 2018-05-15: qty 4

## 2018-05-15 MED ORDER — ONDANSETRON HCL 4 MG/2ML IJ SOLN
8.0000 mg | Freq: Once | INTRAMUSCULAR | Status: AC
  Administered 2018-05-15: 8 mg via INTRAVENOUS

## 2018-05-15 NOTE — Patient Instructions (Signed)
[facility] Discharge Instructions for Patients Receiving Chemotherapy  Today you received the following chemotherapy agents: Keytruda and Alimta.  To help prevent nausea and vomiting after your treatment, we encourage you to take your nausea medication as directed.  If you develop nausea and vomiting that is not controlled by your nausea medication, call the clinic.   BELOW ARE SYMPTOMS THAT SHOULD BE REPORTED IMMEDIATELY:  *FEVER GREATER THAN 100.5 F  *CHILLS WITH OR WITHOUT FEVER  NAUSEA AND VOMITING THAT IS NOT CONTROLLED WITH YOUR NAUSEA MEDICATION  *UNUSUAL SHORTNESS OF BREATH  *UNUSUAL BRUISING OR BLEEDING  TENDERNESS IN MOUTH AND THROAT WITH OR WITHOUT PRESENCE OF ULCERS  *URINARY PROBLEMS  *BOWEL PROBLEMS  UNUSUAL RASH Items with * indicate a potential emergency and should be followed up as soon as possible.  Feel free to call the clinic should you have any questions or concerns. The clinic phone number is [phone].  Please show the [facility] at check-in to the Emergency Department and triage nurse.

## 2018-05-15 NOTE — Progress Notes (Signed)
[place] [phone]   [phone]  OFFICE PROGRESS NOTE  Patient, No Pcp Per No address on file  DIAGNOSIS: Metastatic non-small cell lung cancer, adenocarcinoma initially diagnosed as stage IB (T2a, N0, M0) non-small cell lung cancer, adenocarcinoma diagnosed in December 2017.  The patient has evidence for disease recurrence in July 2019.  Biomarker Findings Tumor Mutational Burden - TMB-Intermediate (16 Muts/Mb) Microsatellite status - MS-Stable Genomic Findings For a complete list of the genes assayed, please refer to the Appendix. KIT amplification KRAS U93A SMAD4 splice site [id number]>K GU54 I232F 7 Disease relevant genes with no reportable alterations: EGFR, ALK, BRAF, MET, RET, ERBB2, ROS1   PDL 1 expression is 0%  PRIOR THERAPY:  1) status post left lower lobectomy as well as wedge resection of the left upper lobe on 05/11/2016. 2) Adjuvant systemic chemotherapy with cisplatin 75 MG/M2 and Alimta 500 MG/M2 every 3 weeks. First dose 07/03/2016. Status post 4 cycles.  CURRENT THERAPY: Systemic chemotherapy with carboplatin for AUC of 5, Alimta 500 mg/M2 and Keytruda 200 mg IV every 3 weeks.  Status post 7 cycles.  Starting from cycle #5 the patient will be treated with maintenance Alimta and Ketruda (pembrolizumab) every 3 weeks.  INTERVAL HISTORY: William Kales [name] 63 y.o. male returns to the clinic today for follow-up visit accompanied by his wife.  The patient is feeling fine today with no concerning complaints except for mild shortness of breath with exertion.  He denied having any chest pain, cough or hemoptysis.  He denied having any fever or chills.  He has no nausea, vomiting, diarrhea or constipation.  He denied having any recent weight loss or night sweats.  He has no headache or visual changes.  He is tolerating his current maintenance treatment with Alimta and Keytruda fairly well.  The patient had repeat CT scan of the  chest, abdomen and pelvis performed recently and he is here for evaluation and discussion of his scan results.  MEDICAL HISTORY: Past Medical History:  Diagnosis Date  . AAA (abdominal aortic aneurysm) ([place])   . AAA (abdominal aortic aneurysm) without rupture ([place]) 02/04/2014  . Cancer ([place])    lung  . Encounter for antineoplastic chemotherapy 06/06/2016  . History of hepatitis B   . HNP (herniated nucleus pulposus), lumbar    L4 with radiculopathy  . Hypertension   . Lung cancer ([place]) 05/18/2016  . Malignant neoplasm of lower lobe of left lung ([place]) 04/05/2016  . Mass of left lung   . Mitral insufficiency 04/06/2016   This patient will eventually need MV repair if his prognosis is good from oncology standpoint. However, his lung cancer therapy  is the priority right now. His cardiac condition won't preclude possible lung surgery.  Once his lung cancer is under control he will need a TEE to further evaluate his mitral valve anatomy and MR severity, and also have ischemic workup as tere is evidence on calcificati  . Renal insufficiency 10/09/2016  . S/P partial lobectomy of lung 05/11/2016  . Varicose veins of legs     ALLERGIES:  is allergic to tramadol.  MEDICATIONS:  Current Outpatient Medications  Medication Sig Dispense Refill  . folic acid (FOLVITE) 1 MG tablet Take 1 tablet (1 mg total) by mouth daily. 30 tablet 4  . losartan (COZAAR) 25 MG tablet TAKE 1 TABLET BY MOUTH EVERY DAY 90 tablet 3   No current facility-administered medications for this visit.  SURGICAL HISTORY:  Past Surgical History:  Procedure Laterality Date  . ABDOMINAL AORTIC ENDOVASCULAR STENT GRAFT N/A 03/12/2016   Procedure: ABDOMINAL AORTIC ENDOVASCULAR STENT GRAFT;  Surgeon: Conrad Baxter, MD;  Location: [facility];  Service: Vascular;  Laterality: N/A;  . COLONOSCOPY    . INGUINAL HERNIA REPAIR Left 1975   Left inguinal hernia  . INGUINAL HERNIA REPAIR Right   . LOBECTOMY Left 05/11/2016   Procedure:  LEFT LOWER LOBE LOBECTOMY AND LEFT UPPER LOBE  RESECTION AND PLACEMENT OF ON-Q;  Surgeon: [name], MD;  Location: [facility];  Service: Thoracic;  Laterality: Left;  . LUMBAR LAMINECTOMY  October 22, 2012  . LYMPH NODE DISSECTION Left 05/11/2016   Procedure: LYMPH NODE DISSECTION;  Surgeon: [name], MD;  Location: [facility];  Service: Thoracic;  Laterality: Left;  . STAPLING OF BLEBS Left 05/11/2016   Procedure: STAPLING OF APICAL BLEB;  Surgeon: [name], MD;  Location: [facility];  Service: Thoracic;  Laterality: Left;  Marland Kitchen VIDEO ASSISTED THORACOSCOPY Left 05/18/2016   Procedure: VIDEO ASSISTED THORACOSCOPY WITH REMOVAL OF LEFT APICAL BLEB;  Surgeon: [name], MD;  Location: [facility];  Service: Thoracic;  Laterality: Left;  Marland Kitchen VIDEO ASSISTED THORACOSCOPY (VATS)/WEDGE RESECTION Left 05/11/2016   Procedure: LEFT VIDEO ASSISTED THORACOSCOPY (VATS);  Surgeon: [name], MD;  Location: [facility];  Service: Thoracic;  Laterality: Left;  Marland Kitchen VIDEO BRONCHOSCOPY N/A 05/11/2016   Procedure: VIDEO BRONCHOSCOPY, LEFT LUNG;  Surgeon: [name], MD;  Location: [facility];  Service: Thoracic;  Laterality: N/A;  . VIDEO BRONCHOSCOPY N/A 05/18/2016   Procedure: VIDEO BRONCHOSCOPY WITH BRONCHIAL WASHING;  Surgeon: [name], MD;  Location: [facility];  Service: Thoracic;  Laterality: N/A;    REVIEW OF SYSTEMS:  Constitutional: positive for fatigue Eyes: negative Ears, nose, mouth, throat, and face: negative Respiratory: positive for dyspnea on exertion Cardiovascular: negative Gastrointestinal: negative Genitourinary:negative Integument/breast: negative Hematologic/lymphatic: negative Musculoskeletal:negative Neurological: negative Behavioral/Psych: negative Endocrine: negative Allergic/Immunologic: negative   PHYSICAL EXAMINATION: General appearance: alert, cooperative, fatigued and no distress Head: Normocephalic, without obvious abnormality, atraumatic Neck: no adenopathy, no JVD,  supple, symmetrical, trachea midline and thyroid not enlarged, symmetric, no tenderness/mass/nodules Lymph nodes: Cervical, supraclavicular, and axillary nodes normal. Resp: clear to auscultation bilaterally Back: symmetric, no curvature. ROM normal. No CVA tenderness. Cardio: regular rate and rhythm, S1, S2 normal, no murmur, click, rub or gallop GI: soft, non-tender; bowel sounds normal; no masses,  no organomegaly Extremities: extremities normal, atraumatic, no cyanosis or edema Neurologic: Alert and oriented X 3, normal strength and tone. Normal symmetric reflexes. Normal coordination and gait   ECOG PERFORMANCE STATUS: 1 - Symptomatic but completely ambulatory  Blood pressure (!) 162/87, pulse 100, temperature 98.6 F (37 C), temperature source Oral, resp. rate 18, height '6\' 3"'  (1.905 m), weight 158 lb 14.4 oz (72.1 kg), SpO2 95 %.  LABORATORY DATA: Lab Results  Component Value Date   WBC 7.3 05/15/2018   HGB 13.2 05/15/2018   HCT 40.7 05/15/2018   MCV 107.4 (H) 05/15/2018   PLT 376 05/15/2018      Chemistry      Component Value Date/Time   NA 134 (L) 04/24/2018 0836   NA 138 01/11/2017 1343   K 4.0 04/24/2018 0836   K 4.7 01/11/2017 1343   CL 104 04/24/2018 0836   CO2 20 (L) 04/24/2018 0836   CO2 23 01/11/2017 1343   BUN 12 04/24/2018 0836   BUN 20.2 01/11/2017 1343  CREATININE 1.60 (H) 04/24/2018 0836   CREATININE 1.6 (H) 01/11/2017 1343      Component Value Date/Time   CALCIUM 9.3 04/24/2018 0836   CALCIUM 9.3 01/11/2017 1343   ALKPHOS 108 04/24/2018 0836   ALKPHOS 89 01/11/2017 1343   AST 68 (H) 04/24/2018 0836   AST 41 (H) 01/11/2017 1343   ALT 52 (H) 04/24/2018 0836   ALT 27 01/11/2017 1343   BILITOT 0.5 04/24/2018 0836   BILITOT 0.49 01/11/2017 1343       RADIOGRAPHIC STUDIES: Ct Chest W Contrast  Result Date: 05/07/2018 CLINICAL DATA:  Initial stage IB left lower lobe lung adenocarcinoma status post left lower lobectomy and left upper lobe wedge  resection 05/11/2016, with recurrent metastatic disease to the lungs and T4 spine identified July 2019. Ongoing chemotherapy and immunotherapy. Restaging. EXAM: CT CHEST, ABDOMEN, AND PELVIS WITH CONTRAST TECHNIQUE: Multidetector CT imaging of the chest, abdomen and pelvis was performed following the standard protocol during bolus administration of intravenous contrast. CONTRAST:  132m OMNIPAQUE IOHEXOL 300 MG/ML  SOLN COMPARISON:  02/18/2018 CT chest, abdomen and pelvis. FINDINGS: CT CHEST FINDINGS Cardiovascular: Top-normal heart size. No significant pericardial effusion/thickening. Atherosclerotic thoracic aorta with stable dilated aortic root measuring 5.2 cm diameter. Normal caliber pulmonary arteries. No central pulmonary emboli. Mediastinum/Nodes: Stable hypodense 1.5 cm posterior left thyroid lobe nodule. Unremarkable esophagus. No axillary adenopathy. No substantial change in small ill-defined soft tissue density focus in the anterior mediastinum anterior to the ascending thoracic aorta (series 2/image 40), favor anterior pericardial recess debris versus hyperplastic thymic tissue. Otherwise no pathologically enlarged mediastinal or hilar nodes. Lungs/Pleura: No pneumothorax. Stable large calcified posterior right pleural plaque. Trace dependent right pleural effusion. No left pleural effusion. Status post left lower lobectomy and posterior left upper lobe wedge resection. Severe centrilobular and paraseptal emphysema with mild diffuse bronchial wall thickening. There is new widespread patchy ground-glass opacity and irregular thickening of the peribronchovascular interstitium and interlobular septa throughout basilar right lower lobe. Previously visualized 5 mm endobronchial lesion in the right lower lobe is no longer apparent on today's scan. Previously visualized clustered 6 mm and 4 mm posterior left upper lobe pulmonary nodules have decreased to 4 mm and 3 mm, respectively (series 6/images 50 and 55).  No new significant pulmonary nodules. Musculoskeletal: Slightly expansile sclerotic lesion in the T4 spinous process is increasingly sclerotic. No new focal osseous lesions. CT ABDOMEN PELVIS FINDINGS Hepatobiliary: Normal liver size. Subcentimeter hypodense segment 4B left liver lobe lesion (series 2/image 80), too small to characterize, unchanged, probably benign. No new liver lesions. Normal gallbladder with no radiopaque cholelithiasis. No biliary ductal dilatation. Pancreas: Normal, with no mass or duct dilation. Spleen: Normal size. No mass. Adrenals/Urinary Tract: Normal adrenals. No hydronephrosis. Subcentimeter hypodense renal cortical lesions scattered in the kidneys are too small to characterize and not appreciably changed. No new renal lesions. Normal bladder. Stomach/Bowel: Normal non-distended stomach. Normal caliber small bowel with no small bowel wall thickening. Normal appendix. Normal large bowel with no diverticulosis, large bowel wall thickening or pericolonic fat stranding. Vascular/Lymphatic: Stable aorto bi-iliac stent graft repair of 4.1 cm infrarenal abdominal aortic aneurysm. Patent portal, splenic, hepatic and renal veins. No pathologically enlarged lymph nodes in the abdomen or pelvis. Reproductive: Moderate prostatomegaly, stable. Other: No pneumoperitoneum, ascites or focal fluid collection. Musculoskeletal: No aggressive appearing focal osseous lesions. Marked degenerative disc disease at L4-5. IMPRESSION: 1. Increasingly sclerotic T4 spinous process metastasis, compatible with treatment effect. No new osseous lesions. 2. Continued reduction in small clustered  posterior left upper lobe and right lower lobe endobronchial pulmonary metastases. No new discrete pulmonary nodules. 3. New widespread patchy ground-glass opacity and irregular peribronchovascular and interlobular interstitial thickening throughout the basilar right lower lobe, equivocal for an inflammatory process versus new  lymphangitic tumor. New trace dependent right pleural effusion. Close chest CT follow-up suggested. 4. Nonspecific small soft tissue density focus in the anterior mediastinum along the ascending thoracic aorta, not substantially changed, favor anterior pericardial recess debris versus hyperplastic thymic tissue, recommend continued attention on follow-up. 5. No evidence of metastatic disease in the abdomen or pelvis. 6. Stable dilated aortic root, 5.2 cm maximum diameter. 7. Aortic Atherosclerosis (ICD10-I70.0) and Emphysema (ICD10-J43.9). Electronically Signed   By: Ilona Sorrel M.D.   On: 05/07/2018 14:09   Ct Abdomen Pelvis W Contrast  Result Date: 05/07/2018 CLINICAL DATA:  Initial stage IB left lower lobe lung adenocarcinoma status post left lower lobectomy and left upper lobe wedge resection 05/11/2016, with recurrent metastatic disease to the lungs and T4 spine identified July 2019. Ongoing chemotherapy and immunotherapy. Restaging. EXAM: CT CHEST, ABDOMEN, AND PELVIS WITH CONTRAST TECHNIQUE: Multidetector CT imaging of the chest, abdomen and pelvis was performed following the standard protocol during bolus administration of intravenous contrast. CONTRAST:  153m OMNIPAQUE IOHEXOL 300 MG/ML  SOLN COMPARISON:  02/18/2018 CT chest, abdomen and pelvis. FINDINGS: CT CHEST FINDINGS Cardiovascular: Top-normal heart size. No significant pericardial effusion/thickening. Atherosclerotic thoracic aorta with stable dilated aortic root measuring 5.2 cm diameter. Normal caliber pulmonary arteries. No central pulmonary emboli. Mediastinum/Nodes: Stable hypodense 1.5 cm posterior left thyroid lobe nodule. Unremarkable esophagus. No axillary adenopathy. No substantial change in small ill-defined soft tissue density focus in the anterior mediastinum anterior to the ascending thoracic aorta (series 2/image 40), favor anterior pericardial recess debris versus hyperplastic thymic tissue. Otherwise no pathologically enlarged  mediastinal or hilar nodes. Lungs/Pleura: No pneumothorax. Stable large calcified posterior right pleural plaque. Trace dependent right pleural effusion. No left pleural effusion. Status post left lower lobectomy and posterior left upper lobe wedge resection. Severe centrilobular and paraseptal emphysema with mild diffuse bronchial wall thickening. There is new widespread patchy ground-glass opacity and irregular thickening of the peribronchovascular interstitium and interlobular septa throughout basilar right lower lobe. Previously visualized 5 mm endobronchial lesion in the right lower lobe is no longer apparent on today's scan. Previously visualized clustered 6 mm and 4 mm posterior left upper lobe pulmonary nodules have decreased to 4 mm and 3 mm, respectively (series 6/images 50 and 55). No new significant pulmonary nodules. Musculoskeletal: Slightly expansile sclerotic lesion in the T4 spinous process is increasingly sclerotic. No new focal osseous lesions. CT ABDOMEN PELVIS FINDINGS Hepatobiliary: Normal liver size. Subcentimeter hypodense segment 4B left liver lobe lesion (series 2/image 80), too small to characterize, unchanged, probably benign. No new liver lesions. Normal gallbladder with no radiopaque cholelithiasis. No biliary ductal dilatation. Pancreas: Normal, with no mass or duct dilation. Spleen: Normal size. No mass. Adrenals/Urinary Tract: Normal adrenals. No hydronephrosis. Subcentimeter hypodense renal cortical lesions scattered in the kidneys are too small to characterize and not appreciably changed. No new renal lesions. Normal bladder. Stomach/Bowel: Normal non-distended stomach. Normal caliber small bowel with no small bowel wall thickening. Normal appendix. Normal large bowel with no diverticulosis, large bowel wall thickening or pericolonic fat stranding. Vascular/Lymphatic: Stable aorto bi-iliac stent graft repair of 4.1 cm infrarenal abdominal aortic aneurysm. Patent portal, splenic,  hepatic and renal veins. No pathologically enlarged lymph nodes in the abdomen or pelvis. Reproductive: Moderate prostatomegaly, stable. Other:  No pneumoperitoneum, ascites or focal fluid collection. Musculoskeletal: No aggressive appearing focal osseous lesions. Marked degenerative disc disease at L4-5. IMPRESSION: 1. Increasingly sclerotic T4 spinous process metastasis, compatible with treatment effect. No new osseous lesions. 2. Continued reduction in small clustered posterior left upper lobe and right lower lobe endobronchial pulmonary metastases. No new discrete pulmonary nodules. 3. New widespread patchy ground-glass opacity and irregular peribronchovascular and interlobular interstitial thickening throughout the basilar right lower lobe, equivocal for an inflammatory process versus new lymphangitic tumor. New trace dependent right pleural effusion. Close chest CT follow-up suggested. 4. Nonspecific small soft tissue density focus in the anterior mediastinum along the ascending thoracic aorta, not substantially changed, favor anterior pericardial recess debris versus hyperplastic thymic tissue, recommend continued attention on follow-up. 5. No evidence of metastatic disease in the abdomen or pelvis. 6. Stable dilated aortic root, 5.2 cm maximum diameter. 7. Aortic Atherosclerosis (ICD10-I70.0) and Emphysema (ICD10-J43.9). Electronically Signed   By: Ilona Sorrel M.D.   On: 05/07/2018 14:09    ASSESSMENT AND PLAN:  This is a very pleasant 63 years old white male with a stage IB non-small cell lung cancer, adenocarcinoma status post wedge resection of the left upper lobe followed by 4 cycles of adjuvant systemic chemotherapy with cisplatin and Alimta and he tolerated his treatment well except for fatigue. The patient has been on observation since June 2018.   The recent imaging studies including CT scan of the chest as well as a PET scan showed evidence for disease recurrence with metastatic disease  presented with bilateral pulmonary nodules as well as destructive bone lesions at T4 vertebral body, biopsy proven to be metastatic adenocarcinoma. Molecular studies by foundation 1 showed no actionable mutations.  PDL 1 expression was negative. The patient is currently undergoing treatment with carboplatin, Alimta and Ketruda (pembrolizumab) status post 7 cycles. Starting from cycle #5 the patient will be treated with maintenance Alimta and Ketruda (pembrolizumab) every 3 weeks. The patient had repeat CT scan of the chest, abdomen and pelvis performed recently.  I personally and independently reviewed the scans and discussed the results with the patient and his wife.  His scan showed no concerning findings for disease progression but there was some inflammatory process in the lung. I recommended for the patient to continue his current treatment with maintenance Alimta and Keytruda. I will treat the patient empirically with doxycycline 100 mg p.o. twice daily for 1 week for the suspicious inflammatory process in the lung. He will come back for follow-up visit in 3 weeks for evaluation before starting cycle #9. The patient was advised to call immediately if he has any concerning symptoms in the interval. The patient voices understanding of current disease status and treatment options and is in agreement with the current care plan. All questions were answered. The patient knows to call the clinic with any problems, questions or concerns. We can certainly see the patient much sooner if necessary.  Disclaimer: This note was dictated with voice recognition software. Similar sounding words can inadvertently be transcribed and may not be corrected upon review.

## 2018-05-16 ENCOUNTER — Telehealth: Payer: Self-pay | Admitting: Internal Medicine

## 2018-05-16 NOTE — Telephone Encounter (Signed)
3 cycles already scheduled per 1/23 los.

## 2018-06-03 ENCOUNTER — Other Ambulatory Visit: Payer: Self-pay | Admitting: Medical Oncology

## 2018-06-03 DIAGNOSIS — C3492 Malignant neoplasm of unspecified part of left bronchus or lung: Secondary | ICD-10-CM

## 2018-06-04 ENCOUNTER — Telehealth: Payer: Self-pay | Admitting: Internal Medicine

## 2018-06-04 ENCOUNTER — Inpatient Hospital Stay: Payer: No Typology Code available for payment source

## 2018-06-04 ENCOUNTER — Encounter: Payer: Self-pay | Admitting: Internal Medicine

## 2018-06-04 ENCOUNTER — Inpatient Hospital Stay: Payer: No Typology Code available for payment source | Attending: Internal Medicine | Admitting: Internal Medicine

## 2018-06-04 VITALS — HR 76

## 2018-06-04 VITALS — BP 130/78 | HR 101 | Temp 97.9°F | Resp 18 | Ht 75.0 in | Wt 152.6 lb

## 2018-06-04 DIAGNOSIS — C7802 Secondary malignant neoplasm of left lung: Secondary | ICD-10-CM | POA: Insufficient documentation

## 2018-06-04 DIAGNOSIS — Z5111 Encounter for antineoplastic chemotherapy: Secondary | ICD-10-CM | POA: Diagnosis present

## 2018-06-04 DIAGNOSIS — C3432 Malignant neoplasm of lower lobe, left bronchus or lung: Secondary | ICD-10-CM

## 2018-06-04 DIAGNOSIS — Z5112 Encounter for antineoplastic immunotherapy: Secondary | ICD-10-CM | POA: Diagnosis present

## 2018-06-04 DIAGNOSIS — Z79899 Other long term (current) drug therapy: Secondary | ICD-10-CM

## 2018-06-04 DIAGNOSIS — C7801 Secondary malignant neoplasm of right lung: Secondary | ICD-10-CM | POA: Insufficient documentation

## 2018-06-04 DIAGNOSIS — C3492 Malignant neoplasm of unspecified part of left bronchus or lung: Secondary | ICD-10-CM

## 2018-06-04 DIAGNOSIS — Z9221 Personal history of antineoplastic chemotherapy: Secondary | ICD-10-CM | POA: Insufficient documentation

## 2018-06-04 DIAGNOSIS — I10 Essential (primary) hypertension: Secondary | ICD-10-CM

## 2018-06-04 DIAGNOSIS — Z221 Carrier of other intestinal infectious diseases: Secondary | ICD-10-CM

## 2018-06-04 DIAGNOSIS — C7800 Secondary malignant neoplasm of unspecified lung: Secondary | ICD-10-CM | POA: Diagnosis not present

## 2018-06-04 DIAGNOSIS — Z902 Acquired absence of lung [part of]: Secondary | ICD-10-CM

## 2018-06-04 DIAGNOSIS — C7951 Secondary malignant neoplasm of bone: Secondary | ICD-10-CM

## 2018-06-04 LAB — CBC WITH DIFFERENTIAL (CANCER CENTER ONLY)
Abs Immature Granulocytes: 0.04 10*3/uL (ref 0.00–0.07)
BASOS PCT: 1 %
Basophils Absolute: 0.1 10*3/uL (ref 0.0–0.1)
Eosinophils Absolute: 0.3 10*3/uL (ref 0.0–0.5)
Eosinophils Relative: 4 %
HCT: 37.2 % — ABNORMAL LOW (ref 39.0–52.0)
Hemoglobin: 12.2 g/dL — ABNORMAL LOW (ref 13.0–17.0)
Immature Granulocytes: 1 %
Lymphocytes Relative: 23 %
Lymphs Abs: 1.7 10*3/uL (ref 0.7–4.0)
MCH: 33.9 pg (ref 26.0–34.0)
MCHC: 32.8 g/dL (ref 30.0–36.0)
MCV: 103.3 fL — ABNORMAL HIGH (ref 80.0–100.0)
Monocytes Absolute: 0.9 10*3/uL (ref 0.1–1.0)
Monocytes Relative: 12 %
Neutro Abs: 4.5 10*3/uL (ref 1.7–7.7)
Neutrophils Relative %: 59 %
Platelet Count: 433 10*3/uL — ABNORMAL HIGH (ref 150–400)
RBC: 3.6 MIL/uL — ABNORMAL LOW (ref 4.22–5.81)
RDW: 13.8 % (ref 11.5–15.5)
WBC Count: 7.6 10*3/uL (ref 4.0–10.5)
nRBC: 0 % (ref 0.0–0.2)

## 2018-06-04 LAB — CMP (CANCER CENTER ONLY)
ALT: 38 U/L (ref 0–44)
AST: 50 U/L — ABNORMAL HIGH (ref 15–41)
Albumin: 3.1 g/dL — ABNORMAL LOW (ref 3.5–5.0)
Alkaline Phosphatase: 81 U/L (ref 38–126)
Anion gap: 10 (ref 5–15)
BUN: 12 mg/dL (ref 8–23)
CO2: 21 mmol/L — ABNORMAL LOW (ref 22–32)
CREATININE: 1.34 mg/dL — AB (ref 0.61–1.24)
Calcium: 9.2 mg/dL (ref 8.9–10.3)
Chloride: 106 mmol/L (ref 98–111)
GFR, Est AFR Am: >60 mL/min (ref >60)
GFR, Estimated: 56 mL/min — ABNORMAL LOW (ref >60)
Glucose, Bld: 106 mg/dL — ABNORMAL HIGH (ref 70–99)
Potassium: 3.6 mmol/L (ref 3.5–5.1)
Sodium: 137 mmol/L (ref 135–145)
TOTAL PROTEIN: 7.8 g/dL (ref 6.5–8.1)
Total Bilirubin: 0.3 mg/dL (ref 0.3–1.2)

## 2018-06-04 LAB — TSH: TSH: 1.912 u[IU]/mL (ref 0.320–4.118)

## 2018-06-04 MED ORDER — SODIUM CHLORIDE 0.9 % IV SOLN
200.0000 mg | Freq: Once | INTRAVENOUS | Status: AC
  Administered 2018-06-04: 200 mg via INTRAVENOUS
  Filled 2018-06-04: qty 8

## 2018-06-04 MED ORDER — CYANOCOBALAMIN 1000 MCG/ML IJ SOLN
1000.0000 ug | Freq: Once | INTRAMUSCULAR | Status: AC
  Administered 2018-06-04: 1000 ug via INTRAMUSCULAR

## 2018-06-04 MED ORDER — ONDANSETRON HCL 4 MG/2ML IJ SOLN
8.0000 mg | Freq: Once | INTRAMUSCULAR | Status: AC
  Administered 2018-06-04: 8 mg via INTRAVENOUS

## 2018-06-04 MED ORDER — DEXAMETHASONE SODIUM PHOSPHATE 10 MG/ML IJ SOLN
INTRAMUSCULAR | Status: AC
  Filled 2018-06-04: qty 1

## 2018-06-04 MED ORDER — DEXAMETHASONE SODIUM PHOSPHATE 10 MG/ML IJ SOLN
10.0000 mg | Freq: Once | INTRAMUSCULAR | Status: AC
  Administered 2018-06-04: 10 mg via INTRAVENOUS

## 2018-06-04 MED ORDER — CYANOCOBALAMIN 1000 MCG/ML IJ SOLN
INTRAMUSCULAR | Status: AC
  Filled 2018-06-04: qty 1

## 2018-06-04 MED ORDER — ONDANSETRON HCL 4 MG/2ML IJ SOLN
INTRAMUSCULAR | Status: AC
  Filled 2018-06-04: qty 4

## 2018-06-04 MED ORDER — SODIUM CHLORIDE 0.9 % IV SOLN
Freq: Once | INTRAVENOUS | Status: AC
  Administered 2018-06-04: 13:00:00 via INTRAVENOUS
  Filled 2018-06-04: qty 250

## 2018-06-04 MED ORDER — SODIUM CHLORIDE 0.9 % IV SOLN
525.0000 mg/m2 | Freq: Once | INTRAVENOUS | Status: AC
  Administered 2018-06-04: 1000 mg via INTRAVENOUS
  Filled 2018-06-04: qty 40

## 2018-06-04 NOTE — Progress Notes (Signed)
[name] [phone]   Fax:([phone]  OFFICE PROGRESS NOTE  Patient, No Pcp Per No address on file  DIAGNOSIS: Metastatic non-small cell lung cancer, adenocarcinoma initially diagnosed as stage IB (T2a, N0, M0) non-small cell lung cancer, adenocarcinoma diagnosed in December 2017.  The patient has evidence for disease recurrence in July 2019.  Biomarker Findings Tumor Mutational Burden - TMB-Intermediate (16 Muts/Mb) Microsatellite status - MS-Stable Genomic Findings For a complete list of the genes assayed, please refer to the Appendix. KIT amplification KRAS X48A SMAD4 splice site [id number] MO70 I232F 7 Disease relevant genes with no reportable alterations: EGFR, ALK, BRAF, MET, RET, ERBB2, ROS1   PDL 1 expression is 0%  PRIOR THERAPY:  1) status post left lower lobectomy as well as wedge resection of the left upper lobe on 05/11/2016. 2) Adjuvant systemic chemotherapy with cisplatin 75 MG/M2 and Alimta 500 MG/M2 every 3 weeks. First dose 07/03/2016. Status post 4 cycles.  CURRENT THERAPY: Systemic chemotherapy with carboplatin for AUC of 5, Alimta 500 mg/M2 and Keytruda 200 mg IV every 3 weeks.  Status post 8 cycles.  Starting from cycle #5 the patient will be treated with maintenance Alimta and Ketruda (pembrolizumab) every 3 weeks.  INTERVAL HISTORY: [name] 63 y.o. male returns to the clinic today for follow-up visit accompanied by his wife.  The patient is complaining of fatigue and weakness recently.  He lost few pounds because of lack appetite when he was taking doxycycline for the inflammatory process in his lung.  He denied having any current nausea, vomiting, diarrhea or constipation.  He denied having any chest pain, shortness of breath, cough or hemoptysis.  He has no fever or chills.  He is here today for evaluation before starting cycle #9 of his treatment.  MEDICAL HISTORY: Past Medical History:  Diagnosis  Date  . AAA (abdominal aortic aneurysm) ([place])   . AAA (abdominal aortic aneurysm) without rupture ([place]) 02/04/2014  . Cancer ([place])    lung  . Encounter for antineoplastic chemotherapy 06/06/2016  . History of hepatitis B   . HNP (herniated nucleus pulposus), lumbar    L4 with radiculopathy  . Hypertension   . Lung cancer ([place]) 05/18/2016  . Malignant neoplasm of lower lobe of left lung ([place]) 04/05/2016  . Mass of left lung   . Mitral insufficiency 04/06/2016   This patient will eventually need MV repair if his prognosis is good from oncology standpoint. However, his lung cancer therapy  is the priority right now. His cardiac condition won't preclude possible lung surgery.  Once his lung cancer is under control he will need a TEE to further evaluate his mitral valve anatomy and MR severity, and also have ischemic workup as tere is evidence on calcificati  . Renal insufficiency 10/09/2016  . S/P partial lobectomy of lung 05/11/2016  . Varicose veins of legs     ALLERGIES:  is allergic to tramadol.  MEDICATIONS:  Current Outpatient Medications  Medication Sig Dispense Refill  . doxycycline (VIBRA-TABS) 100 MG tablet Take 1 tablet (100 mg total) by mouth 2 (two) times daily. 14 tablet 0  . folic acid (FOLVITE) 1 MG tablet Take 1 tablet (1 mg total) by mouth daily. 30 tablet 4  . losartan (COZAAR) 25 MG tablet TAKE 1 TABLET BY MOUTH EVERY DAY 90 tablet 3   No current facility-administered medications for this visit.     SURGICAL HISTORY:  Past Surgical History:  Procedure Laterality Date  . ABDOMINAL AORTIC ENDOVASCULAR STENT GRAFT N/A 03/12/2016   Procedure: ABDOMINAL AORTIC ENDOVASCULAR STENT GRAFT;  Surgeon: Conrad Butler, MD;  Location: [place];  Service: Vascular;  Laterality: N/A;  . COLONOSCOPY    . INGUINAL HERNIA REPAIR Left 1975   Left inguinal hernia  . INGUINAL HERNIA REPAIR Right   . LOBECTOMY Left 05/11/2016   Procedure: LEFT LOWER LOBE LOBECTOMY AND LEFT UPPER LOBE   RESECTION AND PLACEMENT OF ON-Q;  Surgeon: [name], MD;  Location: [place];  Service: Thoracic;  Laterality: Left;  . LUMBAR LAMINECTOMY  October 22, 2012  . LYMPH NODE DISSECTION Left 05/11/2016   Procedure: LYMPH NODE DISSECTION;  Surgeon: [name], MD;  Location: [place];  Service: Thoracic;  Laterality: Left;  . STAPLING OF BLEBS Left 05/11/2016   Procedure: STAPLING OF APICAL BLEB;  Surgeon: [name], MD;  Location: [place];  Service: Thoracic;  Laterality: Left;  Marland Kitchen VIDEO ASSISTED THORACOSCOPY Left 05/18/2016   Procedure: VIDEO ASSISTED THORACOSCOPY WITH REMOVAL OF LEFT APICAL BLEB;  Surgeon: [name], MD;  Location: [place];  Service: Thoracic;  Laterality: Left;  Marland Kitchen VIDEO ASSISTED THORACOSCOPY (VATS)/WEDGE RESECTION Left 05/11/2016   Procedure: LEFT VIDEO ASSISTED THORACOSCOPY (VATS);  Surgeon: [name], MD;  Location: [place];  Service: Thoracic;  Laterality: Left;  Marland Kitchen VIDEO BRONCHOSCOPY N/A 05/11/2016   Procedure: VIDEO BRONCHOSCOPY, LEFT LUNG;  Surgeon: [name], MD;  Location: [place];  Service: Thoracic;  Laterality: N/A;  . VIDEO BRONCHOSCOPY N/A 05/18/2016   Procedure: VIDEO BRONCHOSCOPY WITH BRONCHIAL WASHING;  Surgeon: [name], MD;  Location: [place];  Service: Thoracic;  Laterality: N/A;    REVIEW OF SYSTEMS:  A comprehensive review of systems was negative except for: Constitutional: positive for anorexia, fatigue and weight loss   PHYSICAL EXAMINATION: General appearance: alert, cooperative, fatigued and no distress Head: Normocephalic, without obvious abnormality, atraumatic Neck: no adenopathy, no JVD, supple, symmetrical, trachea midline and thyroid not enlarged, symmetric, no tenderness/mass/nodules Lymph nodes: Cervical, supraclavicular, and axillary nodes normal. Resp: clear to auscultation bilaterally Back: symmetric, no curvature. ROM normal. No CVA tenderness. Cardio: regular rate and rhythm, S1, S2 normal, no murmur, click,  rub or gallop GI: soft, non-tender; bowel sounds normal; no masses,  no organomegaly Extremities: extremities normal, atraumatic, no cyanosis or edema   ECOG PERFORMANCE STATUS: 1 - Symptomatic but completely ambulatory  Blood pressure 130/78, pulse (!) 101, temperature 97.9 F (36.6 C), temperature source Oral, resp. rate 18, height '6\' 3"'  (1.905 m), weight 152 lb 9.6 oz (69.2 kg), SpO2 95 %.  LABORATORY DATA: Lab Results  Component Value Date   WBC 7.6 06/04/2018   HGB 12.2 (L) 06/04/2018   HCT 37.2 (L) 06/04/2018   MCV 103.3 (H) 06/04/2018   PLT 433 (H) 06/04/2018      Chemistry      Component Value Date/Time   NA 137 06/04/2018 1050   NA 138 01/11/2017 1343   K 3.6 06/04/2018 1050   K 4.7 01/11/2017 1343   CL 106 06/04/2018 1050   CO2 21 (L) 06/04/2018 1050   CO2 23 01/11/2017 1343   BUN 12 06/04/2018 1050   BUN 20.2 01/11/2017 1343   CREATININE 1.34 (H) 06/04/2018 1050   CREATININE 1.6 (H) 01/11/2017 1343      Component Value Date/Time   CALCIUM 9.2 06/04/2018 1050   CALCIUM 9.3 01/11/2017 1343   ALKPHOS 81 06/04/2018 1050  ALKPHOS 89 01/11/2017 1343   AST 50 (H) 06/04/2018 1050   AST 41 (H) 01/11/2017 1343   ALT 38 06/04/2018 1050   ALT 27 01/11/2017 1343   BILITOT 0.3 06/04/2018 1050   BILITOT 0.49 01/11/2017 1343       RADIOGRAPHIC STUDIES: Ct Chest W Contrast  Result Date: 05/07/2018 CLINICAL DATA:  Initial stage IB left lower lobe lung adenocarcinoma status post left lower lobectomy and left upper lobe wedge resection 05/11/2016, with recurrent metastatic disease to the lungs and T4 spine identified July 2019. Ongoing chemotherapy and immunotherapy. Restaging. EXAM: CT CHEST, ABDOMEN, AND PELVIS WITH CONTRAST TECHNIQUE: Multidetector CT imaging of the chest, abdomen and pelvis was performed following the standard protocol during bolus administration of intravenous contrast. CONTRAST:  139m OMNIPAQUE IOHEXOL 300 MG/ML  SOLN COMPARISON:  02/18/2018 CT  chest, abdomen and pelvis. FINDINGS: CT CHEST FINDINGS Cardiovascular: Top-normal heart size. No significant pericardial effusion/thickening. Atherosclerotic thoracic aorta with stable dilated aortic root measuring 5.2 cm diameter. Normal caliber pulmonary arteries. No central pulmonary emboli. Mediastinum/Nodes: Stable hypodense 1.5 cm posterior left thyroid lobe nodule. Unremarkable esophagus. No axillary adenopathy. No substantial change in small ill-defined soft tissue density focus in the anterior mediastinum anterior to the ascending thoracic aorta (series 2/image 40), favor anterior pericardial recess debris versus hyperplastic thymic tissue. Otherwise no pathologically enlarged mediastinal or hilar nodes. Lungs/Pleura: No pneumothorax. Stable large calcified posterior right pleural plaque. Trace dependent right pleural effusion. No left pleural effusion. Status post left lower lobectomy and posterior left upper lobe wedge resection. Severe centrilobular and paraseptal emphysema with mild diffuse bronchial wall thickening. There is new widespread patchy ground-glass opacity and irregular thickening of the peribronchovascular interstitium and interlobular septa throughout basilar right lower lobe. Previously visualized 5 mm endobronchial lesion in the right lower lobe is no longer apparent on today's scan. Previously visualized clustered 6 mm and 4 mm posterior left upper lobe pulmonary nodules have decreased to 4 mm and 3 mm, respectively (series 6/images 50 and 55). No new significant pulmonary nodules. Musculoskeletal: Slightly expansile sclerotic lesion in the T4 spinous process is increasingly sclerotic. No new focal osseous lesions. CT ABDOMEN PELVIS FINDINGS Hepatobiliary: Normal liver size. Subcentimeter hypodense segment 4B left liver lobe lesion (series 2/image 80), too small to characterize, unchanged, probably benign. No new liver lesions. Normal gallbladder with no radiopaque cholelithiasis. No  biliary ductal dilatation. Pancreas: Normal, with no mass or duct dilation. Spleen: Normal size. No mass. Adrenals/Urinary Tract: Normal adrenals. No hydronephrosis. Subcentimeter hypodense renal cortical lesions scattered in the kidneys are too small to characterize and not appreciably changed. No new renal lesions. Normal bladder. Stomach/Bowel: Normal non-distended stomach. Normal caliber small bowel with no small bowel wall thickening. Normal appendix. Normal large bowel with no diverticulosis, large bowel wall thickening or pericolonic fat stranding. Vascular/Lymphatic: Stable aorto bi-iliac stent graft repair of 4.1 cm infrarenal abdominal aortic aneurysm. Patent portal, splenic, hepatic and renal veins. No pathologically enlarged lymph nodes in the abdomen or pelvis. Reproductive: Moderate prostatomegaly, stable. Other: No pneumoperitoneum, ascites or focal fluid collection. Musculoskeletal: No aggressive appearing focal osseous lesions. Marked degenerative disc disease at L4-5. IMPRESSION: 1. Increasingly sclerotic T4 spinous process metastasis, compatible with treatment effect. No new osseous lesions. 2. Continued reduction in small clustered posterior left upper lobe and right lower lobe endobronchial pulmonary metastases. No new discrete pulmonary nodules. 3. New widespread patchy ground-glass opacity and irregular peribronchovascular and interlobular interstitial thickening throughout the basilar right lower lobe, equivocal for an inflammatory process versus  new lymphangitic tumor. New trace dependent right pleural effusion. Close chest CT follow-up suggested. 4. Nonspecific small soft tissue density focus in the anterior mediastinum along the ascending thoracic aorta, not substantially changed, favor anterior pericardial recess debris versus hyperplastic thymic tissue, recommend continued attention on follow-up. 5. No evidence of metastatic disease in the abdomen or pelvis. 6. Stable dilated aortic  root, 5.2 cm maximum diameter. 7. Aortic Atherosclerosis (ICD10-I70.0) and Emphysema (ICD10-J43.9). Electronically Signed   By: Ilona Sorrel M.D.   On: 05/07/2018 14:09   Ct Abdomen Pelvis W Contrast  Result Date: 05/07/2018 CLINICAL DATA:  Initial stage IB left lower lobe lung adenocarcinoma status post left lower lobectomy and left upper lobe wedge resection 05/11/2016, with recurrent metastatic disease to the lungs and T4 spine identified July 2019. Ongoing chemotherapy and immunotherapy. Restaging. EXAM: CT CHEST, ABDOMEN, AND PELVIS WITH CONTRAST TECHNIQUE: Multidetector CT imaging of the chest, abdomen and pelvis was performed following the standard protocol during bolus administration of intravenous contrast. CONTRAST:  168m OMNIPAQUE IOHEXOL 300 MG/ML  SOLN COMPARISON:  02/18/2018 CT chest, abdomen and pelvis. FINDINGS: CT CHEST FINDINGS Cardiovascular: Top-normal heart size. No significant pericardial effusion/thickening. Atherosclerotic thoracic aorta with stable dilated aortic root measuring 5.2 cm diameter. Normal caliber pulmonary arteries. No central pulmonary emboli. Mediastinum/Nodes: Stable hypodense 1.5 cm posterior left thyroid lobe nodule. Unremarkable esophagus. No axillary adenopathy. No substantial change in small ill-defined soft tissue density focus in the anterior mediastinum anterior to the ascending thoracic aorta (series 2/image 40), favor anterior pericardial recess debris versus hyperplastic thymic tissue. Otherwise no pathologically enlarged mediastinal or hilar nodes. Lungs/Pleura: No pneumothorax. Stable large calcified posterior right pleural plaque. Trace dependent right pleural effusion. No left pleural effusion. Status post left lower lobectomy and posterior left upper lobe wedge resection. Severe centrilobular and paraseptal emphysema with mild diffuse bronchial wall thickening. There is new widespread patchy ground-glass opacity and irregular thickening of the  peribronchovascular interstitium and interlobular septa throughout basilar right lower lobe. Previously visualized 5 mm endobronchial lesion in the right lower lobe is no longer apparent on today's scan. Previously visualized clustered 6 mm and 4 mm posterior left upper lobe pulmonary nodules have decreased to 4 mm and 3 mm, respectively (series 6/images 50 and 55). No new significant pulmonary nodules. Musculoskeletal: Slightly expansile sclerotic lesion in the T4 spinous process is increasingly sclerotic. No new focal osseous lesions. CT ABDOMEN PELVIS FINDINGS Hepatobiliary: Normal liver size. Subcentimeter hypodense segment 4B left liver lobe lesion (series 2/image 80), too small to characterize, unchanged, probably benign. No new liver lesions. Normal gallbladder with no radiopaque cholelithiasis. No biliary ductal dilatation. Pancreas: Normal, with no mass or duct dilation. Spleen: Normal size. No mass. Adrenals/Urinary Tract: Normal adrenals. No hydronephrosis. Subcentimeter hypodense renal cortical lesions scattered in the kidneys are too small to characterize and not appreciably changed. No new renal lesions. Normal bladder. Stomach/Bowel: Normal non-distended stomach. Normal caliber small bowel with no small bowel wall thickening. Normal appendix. Normal large bowel with no diverticulosis, large bowel wall thickening or pericolonic fat stranding. Vascular/Lymphatic: Stable aorto bi-iliac stent graft repair of 4.1 cm infrarenal abdominal aortic aneurysm. Patent portal, splenic, hepatic and renal veins. No pathologically enlarged lymph nodes in the abdomen or pelvis. Reproductive: Moderate prostatomegaly, stable. Other: No pneumoperitoneum, ascites or focal fluid collection. Musculoskeletal: No aggressive appearing focal osseous lesions. Marked degenerative disc disease at L4-5. IMPRESSION: 1. Increasingly sclerotic T4 spinous process metastasis, compatible with treatment effect. No new osseous lesions. 2.  Continued reduction in small  clustered posterior left upper lobe and right lower lobe endobronchial pulmonary metastases. No new discrete pulmonary nodules. 3. New widespread patchy ground-glass opacity and irregular peribronchovascular and interlobular interstitial thickening throughout the basilar right lower lobe, equivocal for an inflammatory process versus new lymphangitic tumor. New trace dependent right pleural effusion. Close chest CT follow-up suggested. 4. Nonspecific small soft tissue density focus in the anterior mediastinum along the ascending thoracic aorta, not substantially changed, favor anterior pericardial recess debris versus hyperplastic thymic tissue, recommend continued attention on follow-up. 5. No evidence of metastatic disease in the abdomen or pelvis. 6. Stable dilated aortic root, 5.2 cm maximum diameter. 7. Aortic Atherosclerosis (ICD10-I70.0) and Emphysema (ICD10-J43.9). Electronically Signed   By: Ilona Sorrel M.D.   On: 05/07/2018 14:09    ASSESSMENT AND PLAN:  This is a very pleasant 63 years old white male with a stage IB non-small cell lung cancer, adenocarcinoma status post wedge resection of the left upper lobe followed by 4 cycles of adjuvant systemic chemotherapy with cisplatin and Alimta and he tolerated his treatment well except for fatigue. The patient has been on observation since June 2018.   The recent imaging studies including CT scan of the chest as well as a PET scan showed evidence for disease recurrence with metastatic disease presented with bilateral pulmonary nodules as well as destructive bone lesions at T4 vertebral body, biopsy proven to be metastatic adenocarcinoma. Molecular studies by foundation 1 showed no actionable mutations.  PDL 1 expression was negative. The patient is currently undergoing treatment with carboplatin, Alimta and Ketruda (pembrolizumab) status post 8 cycles. Starting from cycle #5 the patient will be treated with maintenance  Alimta and Ketruda (pembrolizumab) every 3 weeks. The patient tolerated the last cycle of his treatment well except for the fatigue and lack of appetite and weight loss.  But this was also likely associated with his antibiotic treatment with doxycycline. I recommended for the patient to proceed with cycle #9 today as scheduled. I will see him back for follow-up visit in 3 weeks for evaluation before starting cycle #10. I encouraged the patient to increase his oral intake and eat more frequent small meals. He was advised to call immediately if he has any concerning symptoms in the interval. The patient voices understanding of current disease status and treatment options and is in agreement with the current care plan. All questions were answered. The patient knows to call the clinic with any problems, questions or concerns. We can certainly see the patient much sooner if necessary.  Disclaimer: This note was dictated with voice recognition software. Similar sounding words can inadvertently be transcribed and may not be corrected upon review.

## 2018-06-04 NOTE — Telephone Encounter (Signed)
Scheduled appt per 02/12 los.  Patient wanted their appts to be on Thursday, so I rescheduled one of the appts.  Printed calendar and avs.

## 2018-06-11 ENCOUNTER — Other Ambulatory Visit: Payer: Self-pay

## 2018-06-11 DIAGNOSIS — I714 Abdominal aortic aneurysm, without rupture, unspecified: Secondary | ICD-10-CM

## 2018-06-12 ENCOUNTER — Other Ambulatory Visit: Payer: Self-pay

## 2018-06-12 ENCOUNTER — Ambulatory Visit (INDEPENDENT_AMBULATORY_CARE_PROVIDER_SITE_OTHER): Payer: No Typology Code available for payment source | Admitting: Vascular Surgery

## 2018-06-12 ENCOUNTER — Encounter: Payer: Self-pay | Admitting: Vascular Surgery

## 2018-06-12 ENCOUNTER — Ambulatory Visit (HOSPITAL_COMMUNITY)
Admission: RE | Admit: 2018-06-12 | Discharge: 2018-06-12 | Disposition: A | Discharge status: Home / Self Care | Payer: No Typology Code available for payment source | Source: Ambulatory Visit | Attending: Vascular Surgery | Admitting: Vascular Surgery

## 2018-06-12 VITALS — BP 124/77 | HR 95 | Resp 18 | Ht 75.0 in | Wt 150.9 lb

## 2018-06-12 DIAGNOSIS — I714 Abdominal aortic aneurysm, without rupture, unspecified: Secondary | ICD-10-CM

## 2018-06-12 NOTE — Progress Notes (Signed)
Patient is a 63 year old male who returns for follow-up today.  He previously underwent Gore Excluder stent graft repair of an abdominal aortic aneurysm Dr. Geryl Councilman November 2017.  Aneurysm at that point was greater than 6 cm in diameter.  He was last seen by Dr. Bridgett Larsson May 17, 2017.  At that time his aneurysm was 4.9 cm in diameter.  Patient reports no abdominal or back pain.  He is currently undergoing chemotherapy for stage IV lung cancer.  He has lost some weight recently.  Review of systems: He denies shortness of breath.  He denies fever or chills.  He has no abdominal pain.  Past Medical History:  Diagnosis Date  . AAA (abdominal aortic aneurysm) ([place])   . AAA (abdominal aortic aneurysm) without rupture ([place]) 02/04/2014  . Cancer ([place])    lung  . Encounter for antineoplastic chemotherapy 06/06/2016  . History of hepatitis B   . HNP (herniated nucleus pulposus), lumbar    L4 with radiculopathy  . Hypertension   . Lung cancer ([place]) 05/18/2016  . Malignant neoplasm of lower lobe of left lung ([place]) 04/05/2016  . Mass of left lung   . Mitral insufficiency 04/06/2016   This patient will eventually need MV repair if his prognosis is good from oncology standpoint. However, his lung cancer therapy  is the priority right now. His cardiac condition won't preclude possible lung surgery.  Once his lung cancer is under control he will need a TEE to further evaluate his mitral valve anatomy and MR severity, and also have ischemic workup as tere is evidence on calcificati  . Renal insufficiency 10/09/2016  . S/P partial lobectomy of lung 05/11/2016  . Varicose veins of legs     Past Surgical History:  Procedure Laterality Date  . ABDOMINAL AORTIC ENDOVASCULAR STENT GRAFT N/A 03/12/2016   Procedure: ABDOMINAL AORTIC ENDOVASCULAR STENT GRAFT;  Surgeon: Conrad Netawaka, MD;  Location: [facility];  Service: Vascular;  Laterality: N/A;  . COLONOSCOPY    . INGUINAL HERNIA REPAIR Left 1975   Left inguinal hernia   . INGUINAL HERNIA REPAIR Right   . LOBECTOMY Left 05/11/2016   Procedure: LEFT LOWER LOBE LOBECTOMY AND LEFT UPPER LOBE  RESECTION AND PLACEMENT OF ON-Q;  Surgeon: [name], MD;  Location: [place] [facility];  Service: Thoracic;  Laterality: Left;  . LUMBAR LAMINECTOMY  October 22, 2012  . LYMPH NODE DISSECTION Left 05/11/2016   Procedure: LYMPH NODE DISSECTION;  Surgeon: [name], MD;  Location: [place];  Service: Thoracic;  Laterality: Left;  . STAPLING OF BLEBS Left 05/11/2016   Procedure: STAPLING OF APICAL BLEB;  Surgeon: [name], MD;  Location: [place];  Service: Thoracic;  Laterality: Left;  Marland Kitchen VIDEO ASSISTED THORACOSCOPY Left 05/18/2016   Procedure: VIDEO ASSISTED THORACOSCOPY WITH REMOVAL OF LEFT APICAL BLEB;  Surgeon: [name], MD;  Location: [place];  Service: Thoracic;  Laterality: Left;  Marland Kitchen VIDEO ASSISTED THORACOSCOPY (VATS)/WEDGE RESECTION Left 05/11/2016   Procedure: LEFT VIDEO ASSISTED THORACOSCOPY (VATS);  Surgeon: [name], MD;  Location: [facility];  Service: Thoracic;  Laterality: Left;  Marland Kitchen VIDEO BRONCHOSCOPY N/A 05/11/2016   Procedure: VIDEO BRONCHOSCOPY, LEFT LUNG;  Surgeon: [name], MD;  Location: [place];  Service: Thoracic;  Laterality: N/A;  . VIDEO BRONCHOSCOPY N/A 05/18/2016   Procedure: VIDEO BRONCHOSCOPY WITH BRONCHIAL WASHING;  Surgeon: [name], MD;  Location: [place];  Service: Thoracic;  Laterality: N/A;    Current Outpatient Medications on  File Prior to Visit  Medication Sig Dispense Refill  . folic acid (FOLVITE) 1 MG tablet Take 1 tablet (1 mg total) by mouth daily. 30 tablet 4  . losartan (COZAAR) 25 MG tablet TAKE 1 TABLET BY MOUTH EVERY DAY 90 tablet 3  . doxycycline (VIBRA-TABS) 100 MG tablet Take 1 tablet (100 mg total) by mouth 2 (two) times daily. (Patient not taking: Reported on 06/12/2018) 14 tablet 0   No current facility-administered medications on file prior to visit.     Physical exam:  Vitals:   06/12/18 0945   BP: 124/77  Pulse: 95  Resp: 18  SpO2: 98%  Weight: 150 lb 14.5 oz (68.5 kg)  Height: 6\' 3"  (1.905 m)    Neck: No carotid bruits  Chest: Clear to auscultation bilaterally  Cardiac: Regular rate and rhythm  Abdomen: Soft nontender no pulsatile mass  Data: Patient had a duplex ultrasound of his abdominal aorta today this showed good exclusion of the aneurysm with a stent graft and the aneurysm diameter is now decreased to 4 cm from 4.9 cm.  Assessment: Doing well status post Gore Excluder stent graft repair of abdominal aortic aneurysm  Plan: The patient will follow-up in 1 year with our nurse practitioner with an aortic ultrasound at that point.  Ruta Hinds, MD [facility] Office: [phone] Pager: [phone]

## 2018-06-19 ENCOUNTER — Other Ambulatory Visit: Payer: Self-pay | Admitting: Internal Medicine

## 2018-06-24 ENCOUNTER — Ambulatory Visit: Payer: No Typology Code available for payment source | Admitting: Internal Medicine

## 2018-06-24 ENCOUNTER — Other Ambulatory Visit: Payer: No Typology Code available for payment source

## 2018-06-24 ENCOUNTER — Ambulatory Visit: Payer: No Typology Code available for payment source

## 2018-06-24 ENCOUNTER — Other Ambulatory Visit: Payer: Self-pay | Admitting: Physician Assistant

## 2018-06-24 ENCOUNTER — Telehealth: Payer: Self-pay | Admitting: Oncology

## 2018-06-24 DIAGNOSIS — C3492 Malignant neoplasm of unspecified part of left bronchus or lung: Secondary | ICD-10-CM

## 2018-06-24 NOTE — Telephone Encounter (Signed)
William Kales [name] contacted to obtain verbal, telephone consent to share their name and contact information with [name] and team) for purposes of soliciting patient experience feedback.  Verbal consent obtained and documented on "[facility] / [place] INFORMATION" form.  William Kales [name] is aware that [name] will be in contact with them at a future date for screening purposes for interviews.  Please direct questions related to this process to [name] via email at [name] or extension [phone].

## 2018-06-25 ENCOUNTER — Ambulatory Visit (HOSPITAL_COMMUNITY): Payer: No Typology Code available for payment source | Attending: Cardiology

## 2018-06-25 DIAGNOSIS — I714 Abdominal aortic aneurysm, without rupture, unspecified: Secondary | ICD-10-CM

## 2018-06-25 DIAGNOSIS — I340 Nonrheumatic mitral (valve) insufficiency: Secondary | ICD-10-CM | POA: Insufficient documentation

## 2018-06-25 DIAGNOSIS — I10 Essential (primary) hypertension: Secondary | ICD-10-CM | POA: Diagnosis not present

## 2018-06-26 ENCOUNTER — Inpatient Hospital Stay: Payer: No Typology Code available for payment source

## 2018-06-26 ENCOUNTER — Inpatient Hospital Stay: Payer: No Typology Code available for payment source | Attending: Internal Medicine

## 2018-06-26 ENCOUNTER — Inpatient Hospital Stay (HOSPITAL_BASED_OUTPATIENT_CLINIC_OR_DEPARTMENT_OTHER): Payer: No Typology Code available for payment source | Admitting: Physician Assistant

## 2018-06-26 ENCOUNTER — Other Ambulatory Visit: Payer: Self-pay

## 2018-06-26 ENCOUNTER — Telehealth: Payer: Self-pay | Admitting: Internal Medicine

## 2018-06-26 VITALS — BP 147/79 | HR 73 | Temp 97.8°F | Resp 20 | Ht 75.0 in | Wt 155.7 lb

## 2018-06-26 DIAGNOSIS — C3432 Malignant neoplasm of lower lobe, left bronchus or lung: Secondary | ICD-10-CM | POA: Insufficient documentation

## 2018-06-26 DIAGNOSIS — Z902 Acquired absence of lung [part of]: Secondary | ICD-10-CM | POA: Diagnosis not present

## 2018-06-26 DIAGNOSIS — I10 Essential (primary) hypertension: Secondary | ICD-10-CM

## 2018-06-26 DIAGNOSIS — Z5112 Encounter for antineoplastic immunotherapy: Secondary | ICD-10-CM | POA: Insufficient documentation

## 2018-06-26 DIAGNOSIS — C7800 Secondary malignant neoplasm of unspecified lung: Secondary | ICD-10-CM

## 2018-06-26 DIAGNOSIS — C7951 Secondary malignant neoplasm of bone: Secondary | ICD-10-CM | POA: Diagnosis not present

## 2018-06-26 DIAGNOSIS — C3492 Malignant neoplasm of unspecified part of left bronchus or lung: Secondary | ICD-10-CM

## 2018-06-26 DIAGNOSIS — Z5111 Encounter for antineoplastic chemotherapy: Secondary | ICD-10-CM | POA: Diagnosis present

## 2018-06-26 LAB — COMPREHENSIVE METABOLIC PANEL
ALT: 29 U/L (ref 0–44)
ANION GAP: 9 (ref 5–15)
AST: 47 U/L — ABNORMAL HIGH (ref 15–41)
Albumin: 3.7 g/dL (ref 3.5–5.0)
Alkaline Phosphatase: 94 U/L (ref 38–126)
BUN: 12 mg/dL (ref 8–23)
CO2: 17 mmol/L — ABNORMAL LOW (ref 22–32)
Calcium: 9.1 mg/dL (ref 8.9–10.3)
Chloride: 109 mmol/L (ref 98–111)
Creatinine, Ser: 1.29 mg/dL — ABNORMAL HIGH (ref 0.61–1.24)
GFR, EST NON AFRICAN AMERICAN: 58 mL/min — AB (ref >60)
Glucose, Bld: 86 mg/dL (ref 70–99)
Potassium: 4.5 mmol/L (ref 3.5–5.1)
Sodium: 135 mmol/L (ref 135–145)
Total Bilirubin: 0.6 mg/dL (ref 0.3–1.2)
Total Protein: 7.5 g/dL (ref 6.5–8.1)

## 2018-06-26 LAB — CBC WITH DIFFERENTIAL (CANCER CENTER ONLY)
Abs Immature Granulocytes: 0.05 10*3/uL (ref 0.00–0.07)
Basophils Absolute: 0.1 10*3/uL (ref 0.0–0.1)
Basophils Relative: 1 %
EOS PCT: 4 %
Eosinophils Absolute: 0.4 10*3/uL (ref 0.0–0.5)
HCT: 40 % (ref 39.0–52.0)
Hemoglobin: 12.8 g/dL — ABNORMAL LOW (ref 13.0–17.0)
Immature Granulocytes: 1 %
Lymphocytes Relative: 21 %
Lymphs Abs: 1.8 10*3/uL (ref 0.7–4.0)
MCH: 32.4 pg (ref 26.0–34.0)
MCHC: 32 g/dL (ref 30.0–36.0)
MCV: 101.3 fL — AB (ref 80.0–100.0)
MONOS PCT: 16 %
Monocytes Absolute: 1.4 10*3/uL — ABNORMAL HIGH (ref 0.1–1.0)
Neutro Abs: 4.9 10*3/uL (ref 1.7–7.7)
Neutrophils Relative %: 57 %
Platelet Count: 445 10*3/uL — ABNORMAL HIGH (ref 150–400)
RBC: 3.95 MIL/uL — ABNORMAL LOW (ref 4.22–5.81)
RDW: 15.8 % — ABNORMAL HIGH (ref 11.5–15.5)
WBC Count: 8.6 10*3/uL (ref 4.0–10.5)
nRBC: 0 % (ref 0.0–0.2)

## 2018-06-26 LAB — TSH: TSH: 2.115 u[IU]/mL (ref 0.320–4.118)

## 2018-06-26 MED ORDER — DEXAMETHASONE SODIUM PHOSPHATE 10 MG/ML IJ SOLN
INTRAMUSCULAR | Status: AC
  Filled 2018-06-26: qty 1

## 2018-06-26 MED ORDER — DEXAMETHASONE SODIUM PHOSPHATE 10 MG/ML IJ SOLN
10.0000 mg | Freq: Once | INTRAMUSCULAR | Status: AC
  Administered 2018-06-26: 10 mg via INTRAVENOUS

## 2018-06-26 MED ORDER — SODIUM CHLORIDE 0.9 % IV SOLN
Freq: Once | INTRAVENOUS | Status: AC
  Administered 2018-06-26: 14:00:00 via INTRAVENOUS
  Filled 2018-06-26: qty 250

## 2018-06-26 MED ORDER — ONDANSETRON HCL 4 MG/2ML IJ SOLN
INTRAMUSCULAR | Status: AC
  Filled 2018-06-26: qty 4

## 2018-06-26 MED ORDER — ONDANSETRON HCL 4 MG/2ML IJ SOLN
8.0000 mg | Freq: Once | INTRAMUSCULAR | Status: AC
  Administered 2018-06-26: 8 mg via INTRAVENOUS

## 2018-06-26 MED ORDER — SODIUM CHLORIDE 0.9 % IV SOLN
200.0000 mg | Freq: Once | INTRAVENOUS | Status: AC
  Administered 2018-06-26: 200 mg via INTRAVENOUS
  Filled 2018-06-26: qty 8

## 2018-06-26 MED ORDER — SODIUM CHLORIDE 0.9 % IV SOLN
525.0000 mg/m2 | Freq: Once | INTRAVENOUS | Status: AC
  Administered 2018-06-26: 1000 mg via INTRAVENOUS
  Filled 2018-06-26: qty 40

## 2018-06-26 NOTE — Patient Instructions (Signed)
[facility] Discharge Instructions for Patients Receiving Chemotherapy  Today you received the following chemotherapy agents: Keytruda and Alimta.  To help prevent nausea and vomiting after your treatment, we encourage you to take your nausea medication as directed.  If you develop nausea and vomiting that is not controlled by your nausea medication, call the clinic.   BELOW ARE SYMPTOMS THAT SHOULD BE REPORTED IMMEDIATELY:  *FEVER GREATER THAN 100.5 F  *CHILLS WITH OR WITHOUT FEVER  NAUSEA AND VOMITING THAT IS NOT CONTROLLED WITH YOUR NAUSEA MEDICATION  *UNUSUAL SHORTNESS OF BREATH  *UNUSUAL BRUISING OR BLEEDING  TENDERNESS IN MOUTH AND THROAT WITH OR WITHOUT PRESENCE OF ULCERS  *URINARY PROBLEMS  *BOWEL PROBLEMS  UNUSUAL RASH Items with * indicate a potential emergency and should be followed up as soon as possible.  Feel free to call the clinic should you have any questions or concerns. The clinic phone number is [phone].  Please show the [place] at check-in to the Emergency Department and triage nurse.

## 2018-06-26 NOTE — Telephone Encounter (Signed)
Gave avs and calendar ° °

## 2018-06-26 NOTE — Progress Notes (Signed)
[facility] OFFICE PROGRESS NOTE  Patient, No Pcp Per No address on file  DIAGNOSIS:  Metastatic non-small cell lung cancer, adenocarcinoma initially diagnosed as stage IB (T2a, N0, M0) non-small cell lung cancer, adenocarcinoma diagnosed in December 2017.  The patient has evidence for disease recurrence in July 2019.  Biomarker Findings Tumor Mutational Burden - TMB-Intermediate (16 Muts/Mb) Microsatellite status - MS-Stable Genomic Findings For a complete list of the genes assayed, please refer to the Appendix. KIT amplification KRAS T01X SMAD4 splice site [id number] SP23 I232F 7 Disease relevant genes with no reportable alterations: EGFR, ALK, BRAF, MET, RET, ERBB2, ROS1   PDL 1 expression is 0%  PRIOR THERAPY:  1) status post left lower lobectomy as well as wedge resection of the left upper lobe on 05/11/2016. 2) Adjuvant systemic chemotherapy with cisplatin 75 MG/M2 and Alimta 500 MG/M2 every 3 weeks. First dose 07/03/2016. Status post 4 cycles.  CURRENT THERAPY: Systemic chemotherapy with carboplatin for AUC of 5, Alimta 500 mg/M2 and Keytruda 200 mg IV every 3 weeks.  Status post 9 cycles.  Starting from cycle #5 the patient will be treated with maintenance Alimta and Ketruda (pembrolizumab) every 3 weeks.  INTERVAL HISTORY: William Kales [name] 63 y.o. male returns to the clinic accompanied by his wife. The patient is tolerating his treatment well without any adverse effects. Today he is feeling well without any concerning complaints; however, his wife notes he has had a dry cough since cycle # 2. He had previously been treated with doxycycline for some inflammation seen on his last CT scan in January. He denies any cold like symptoms such as fever, chills, sore throat, shortness of breath, or nasal congestion. Otherwise, he is feeling well and denies night sweats or weight loss. His appetite has improved since his last visit. He denies any hemoptysis, wheezing, or  chest pain. He denies any nausea, vomiting, diarrhea, or constipation. He denies any headaches or visual changes. He denies any rashes or skin changes. He is here today for evaluation prior to starting cycle #10 day.   MEDICAL HISTORY: Past Medical History:  Diagnosis Date  . AAA (abdominal aortic aneurysm) ([facility])   . AAA (abdominal aortic aneurysm) without rupture ([facility]) 02/04/2014  . Cancer ([facility])    lung  . Encounter for antineoplastic chemotherapy 06/06/2016  . History of hepatitis B   . HNP (herniated nucleus pulposus), lumbar    L4 with radiculopathy  . Hypertension   . Lung cancer ([facility]) 05/18/2016  . Malignant neoplasm of lower lobe of left lung ([facility]) 04/05/2016  . Mass of left lung   . Mitral insufficiency 04/06/2016   This patient will eventually need MV repair if his prognosis is good from oncology standpoint. However, his lung cancer therapy  is the priority right now. His cardiac condition won't preclude possible lung surgery.  Once his lung cancer is under control he will need a TEE to further evaluate his mitral valve anatomy and MR severity, and also have ischemic workup as tere is evidence on calcificati  . Renal insufficiency 10/09/2016  . S/P partial lobectomy of lung 05/11/2016  . Varicose veins of legs     ALLERGIES:  is allergic to tramadol.  MEDICATIONS:  Current Outpatient Medications  Medication Sig Dispense Refill  . folic acid (FOLVITE) 1 MG tablet TAKE 1 TABLET BY MOUTH EVERY DAY 90 tablet 1  . losartan (COZAAR) 25 MG tablet TAKE 1 TABLET BY MOUTH EVERY DAY 90 tablet 3   No  current facility-administered medications for this visit.     SURGICAL HISTORY:  Past Surgical History:  Procedure Laterality Date  . ABDOMINAL AORTIC ENDOVASCULAR STENT GRAFT N/A 03/12/2016   Procedure: ABDOMINAL AORTIC ENDOVASCULAR STENT GRAFT;  Surgeon: Conrad Versailles, MD;  Location: [facility];  Service: Vascular;  Laterality: N/A;  . COLONOSCOPY    . INGUINAL HERNIA REPAIR Left 1975    Left inguinal hernia  . INGUINAL HERNIA REPAIR Right   . LOBECTOMY Left 05/11/2016   Procedure: LEFT LOWER LOBE LOBECTOMY AND LEFT UPPER LOBE  RESECTION AND PLACEMENT OF ON-Q;  Surgeon: [name], MD;  Location: [facility];  Service: Thoracic;  Laterality: Left;  . LUMBAR LAMINECTOMY  October 22, 2012  . LYMPH NODE DISSECTION Left 05/11/2016   Procedure: LYMPH NODE DISSECTION;  Surgeon: [name], MD;  Location: [facility];  Service: Thoracic;  Laterality: Left;  . STAPLING OF BLEBS Left 05/11/2016   Procedure: STAPLING OF APICAL BLEB;  Surgeon: [name], MD;  Location: [facility];  Service: Thoracic;  Laterality: Left;  Marland Kitchen VIDEO ASSISTED THORACOSCOPY Left 05/18/2016   Procedure: VIDEO ASSISTED THORACOSCOPY WITH REMOVAL OF LEFT APICAL BLEB;  Surgeon: [name], MD;  Location: [facility];  Service: Thoracic;  Laterality: Left;  Marland Kitchen VIDEO ASSISTED THORACOSCOPY (VATS)/WEDGE RESECTION Left 05/11/2016   Procedure: LEFT VIDEO ASSISTED THORACOSCOPY (VATS);  Surgeon: [name], MD;  Location: [facility];  Service: Thoracic;  Laterality: Left;  Marland Kitchen VIDEO BRONCHOSCOPY N/A 05/11/2016   Procedure: VIDEO BRONCHOSCOPY, LEFT LUNG;  Surgeon: [name], MD;  Location: [facility];  Service: Thoracic;  Laterality: N/A;  . VIDEO BRONCHOSCOPY N/A 05/18/2016   Procedure: VIDEO BRONCHOSCOPY WITH BRONCHIAL WASHING;  Surgeon: [name], MD;  Location: [facility];  Service: Thoracic;  Laterality: N/A;    REVIEW OF SYSTEMS:   Review of Systems  Constitutional: Negative for appetite change, chills, fatigue, fever and unexpected weight change.  HENT:   Negative for mouth sores, nosebleeds, sore throat and trouble swallowing.   Eyes: Negative for eye problems and icterus.  Respiratory: Positive for dry cough. Negative hemoptysis, shortness of breath and wheezing.   Cardiovascular: Negative for chest pain and leg swelling.  Gastrointestinal: Negative for abdominal pain, constipation, diarrhea, nausea and vomiting.   Genitourinary: Negative for bladder incontinence, difficulty urinating, dysuria, frequency and hematuria.   Musculoskeletal: Negative for back pain, gait problem, neck pain and neck stiffness.  Skin: Negative for itching and rash.  Neurological: Negative for dizziness, extremity weakness, gait problem, headaches, light-headedness and seizures.  Hematological: Negative for adenopathy. Does not bruise/bleed easily.  Psychiatric/Behavioral: Negative for confusion, depression and sleep disturbance. The patient is not nervous/anxious.     PHYSICAL EXAMINATION:  Blood pressure (!) 147/79, pulse 73, temperature 97.8 F (36.6 C), temperature source Oral, resp. rate 20, height 6' 3" (1.905 m), weight 155 lb 11.2 oz (70.6 kg), SpO2 97 %.  ECOG PERFORMANCE STATUS: 1 - Symptomatic but completely ambulatory  Physical Exam  Constitutional: Oriented to person, place, and time and well-developed, well-nourished, and in no distress. No distress.  HENT:  Head: Normocephalic and atraumatic.  Mouth/Throat: Oropharynx is clear and moist. No oropharyngeal exudate.  Eyes: Conjunctivae are normal. Right eye exhibits no discharge. Left eye exhibits no discharge. No scleral icterus.  Neck: Normal range of motion. Neck supple.  Cardiovascular: Normal rate, regular rhythm, normal heart sounds and intact distal pulses.   Pulmonary/Chest: Effort normal and breath sounds normal. No respiratory distress. No wheezes.  No rales.  Abdominal: Soft. Bowel sounds are normal. Exhibits no distension and no mass. There is no tenderness.  Musculoskeletal: Normal range of motion. Exhibits no edema.  Lymphadenopathy:    No cervical adenopathy.  Neurological: Alert and oriented to person, place, and time. Exhibits normal muscle tone. Gait normal. Coordination normal.  Skin: Skin is warm and dry. No rash noted. Not diaphoretic. No erythema. No pallor.  Psychiatric: Mood, memory and judgment normal.  Vitals reviewed.  LABORATORY  DATA: Lab Results  Component Value Date   WBC 7.6 06/04/2018   HGB 12.2 (L) 06/04/2018   HCT 37.2 (L) 06/04/2018   MCV 103.3 (H) 06/04/2018   PLT 433 (H) 06/04/2018      Chemistry      Component Value Date/Time   NA 135 06/26/2018 1129   NA 138 01/11/2017 1343   K 4.5 06/26/2018 1129   K 4.7 01/11/2017 1343   CL 109 06/26/2018 1129   CO2 17 (L) 06/26/2018 1129   CO2 23 01/11/2017 1343   BUN 12 06/26/2018 1129   BUN 20.2 01/11/2017 1343   CREATININE 1.29 (H) 06/26/2018 1129   CREATININE 1.34 (H) 06/04/2018 1050   CREATININE 1.6 (H) 01/11/2017 1343      Component Value Date/Time   CALCIUM 9.1 06/26/2018 1129   CALCIUM 9.3 01/11/2017 1343   ALKPHOS 94 06/26/2018 1129   ALKPHOS 89 01/11/2017 1343   AST 47 (H) 06/26/2018 1129   AST 50 (H) 06/04/2018 1050   AST 41 (H) 01/11/2017 1343   ALT 29 06/26/2018 1129   ALT 38 06/04/2018 1050   ALT 27 01/11/2017 1343   BILITOT 0.6 06/26/2018 1129   BILITOT 0.3 06/04/2018 1050   BILITOT 0.49 01/11/2017 1343       RADIOGRAPHIC STUDIES:  Vas [place] Evar Duplex  Result Date: 06/12/2018 Endovascular Aortic Repair Study (EVAR) Indications: Follow up exam EVAR 03/12/2016. Limitations: Air/bowel gas.  Comparison Study: Decrease in size of distal sac area Performing Technologist: Alvia Grove RVT  Examination Guidelines: A complete evaluation includes B-mode imaging, spectral Doppler, color Doppler, and power Doppler as needed of all accessible portions of each vessel. Bilateral testing is considered an integral part of a complete examination. Limited examinations for reoccurring indications may be performed as noted.  Endovascular Aortic Repair (EVAR): +----------+----------------+-------------------+-------------------+           Diameter AP (cm)Diameter Trans (cm)Velocities (cm/sec) +----------+----------------+-------------------+-------------------+ Aortic sac4.00            -                  65                   +----------+----------------+-------------------+-------------------+ Right Limb1.39            1.32               52                  +----------+----------------+-------------------+-------------------+ Left Limb 1.27            1.32               53                  +----------+----------------+-------------------+-------------------+  Impression: Abdominal Aorta: Patent stent of the abdominal aorta with a maximum diameter of 4.0 cms, limited visability..  *See table(s) above for measurements and observations.  Electronically signed by Ruta Hinds MD on 06/12/2018 at 9:35:25 AM.  Final      ASSESSMENT/PLAN:  This is a very pleasant 63 year old African American male with metastatic disease who was initially diagnosed with stage IB non-small cell lung cancer, adenocarcinoma of the left upper lobe. Molecular studies showed no actionable mutations. PD-L1 expression was negative.  He was status post a wedge resection of the left upper lobe followed by 4 cycles of adjuvant systemic chemotherapy with cisplatin and Alimta. He tolerated treatment well except for fatigue.  He had been on observation until June of 2018.  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  The patient then underwent systemic chemotherapy with carboplatin, alimta, and keytruda. He is status post 9 cycles. Starting from cycle #5, he has been on maintenance Alimta and Keytruda every 3 weeks.   The patient was seen with Dr. Julien Nordmann today. He is tolerating treatment well without any adverse effects. Recommend he proceed with cycle #10 today as scheduled.  I have arranged for his to obtain a repeat CT chest, abdomen, and pelvis. I will see him back in 3 weeks for evaluation and to review his scan results prior to starting cycle #11.  The patient was advised to call immediately if he has any  concerning symptoms in the interval. The patient voices understanding of current disease status and treatment options and is in agreement with the current care plan. All questions were answered. The patient knows to call the clinic with any problems, questions or concerns. We can certainly see the patient much sooner if necessary  Orders Placed This Encounter  Procedures  . CT Abdomen Pelvis W Contrast    Standing Status:   Future    Standing Expiration Date:   06/26/2019    Order Specific Question:   ** REASON FOR EXAM (FREE TEXT)    Answer:   Restaging Lung Cancer    Order Specific Question:   If indicated for the ordered procedure, I authorize the administration of contrast media per Radiology protocol    Answer:   Yes    Order Specific Question:   Preferred imaging location?    Answer:   [facility]    Order Specific Question:   Is Oral Contrast requested for this exam?    Answer:   Yes, Per Radiology protocol    Order Specific Question:   Radiology Contrast Protocol - do NOT remove file path    Answer:   _0 [email or web address]  . CT Chest W Contrast    Standing Status:   Future    Standing Expiration Date:   06/26/2019    Order Specific Question:   ** REASON FOR EXAM (FREE TEXT)    Answer:   Restaging lung cancer    Order Specific Question:   If indicated for the ordered procedure, I authorize the administration of contrast media per Radiology protocol    Answer:   Yes    Order Specific Question:   Preferred imaging location?    Answer:   [facility]    Order Specific Question:   Radiology Contrast Protocol - do NOT remove file path    Answer:   _1 [email or web address]     Cassandra L Heilingoetter, PA-C 06/26/18  ADDENDUM: Hematology/Oncology Attending: I had a face-to-face encounter with the patient.  I recommended his care plan.  This is a very pleasant 63 years old African-American male with metastatic non-small  cell lung cancer, adenocarcinoma.  The patient is status post induction chemotherapy with carboplatin, Alimta and Keytruda with partial response.  He is currently on maintenance treatment with Alimta and Keytruda status post 5 cycles.  He is tolerating this treatment well. I recommended for the patient to continue his current treatment with the maintenance therapy. We will see the patient back for follow-up visit in 3 weeks for evaluation before the next cycle of his treatment with repeat CT scan of the chest, abdomen and pelvis for restaging of his disease. The patient was advised to call immediately if he has any concerning symptoms in the interval.  Disclaimer: This note was dictated with voice recognition software. Similar sounding words can inadvertently be transcribed and may be missed upon review. Eilleen Kempf, MD 06/28/18

## 2018-06-30 ENCOUNTER — Other Ambulatory Visit: Payer: Self-pay

## 2018-06-30 MED ORDER — VALSARTAN 40 MG PO TABS: 40.0000 mg | ORAL_TABLET | Freq: Every day | ORAL | 11 refills | Status: DC

## 2018-06-30 MED ORDER — LISINOPRIL 2.5 MG PO TABS: 2.5000 mg | ORAL_TABLET | Freq: Every day | ORAL | 11 refills | Status: DC

## 2018-06-30 NOTE — Telephone Encounter (Signed)
Received phone call from [facility] that valsartan is on back order as well. I do not see any allergy or contraindication to ACE. Will send over lisinopril 2.5mg  daily Karlene Einstein can you please let the patient know the plan. Thanks!

## 2018-06-30 NOTE — Telephone Encounter (Signed)
Left the pt and spouse a message to call the office back to go over new med Lisinopril that was sent in, based on losartan and valsartan being on backorder.

## 2018-06-30 NOTE — Telephone Encounter (Signed)
Dr. Meda Coffee and Pharmacy, pt is calling in stating that Losartan 25 mg po daily is now on back order, and he will need a new regimen called in.  Please advise on a different regimen.  Thanks so much!

## 2018-06-30 NOTE — Telephone Encounter (Signed)
Please let patient know I sent in rx for valsartan 40mg  daily.

## 2018-07-01 NOTE — Telephone Encounter (Signed)
Spoke with the pt and informed him of new medication sent to his pharmacy by our Pharmacist Marcelle Overlie.   Endorsed to the pt that this is available for pick-up at his pharmacy.  Informed the pt that we called in lisinopril 2.5 and he should take this po daily.  Pt verbalized understanding and agrees with this plan. Pt more than gracious for all the assistance provided.

## 2018-07-07 ENCOUNTER — Telehealth: Payer: Self-pay | Admitting: Medical Oncology

## 2018-07-07 NOTE — Telephone Encounter (Signed)
Barium for Ct -wants to pick up at sign in desk tomorrow. Can someone have it ready. Barium placed at front desk in drawer.

## 2018-07-09 ENCOUNTER — Telehealth: Payer: Self-pay | Admitting: *Deleted

## 2018-07-09 NOTE — Telephone Encounter (Signed)
COVID-19 Pre-Screening Questions:  . Have you been in contact with someone that was recently sick with fever/cough or confirmed to have the [place] virus?  NO  *Contact with a confirmed case should stay at home, away from confirmed patient, monitor symptoms, and reach out to PCP for e-visit/additional testing.  2. Do you have any of the following symptoms [cough, fever (100.4 or greater)], and/or shortness of breath)?  NO  *ALL PTS W/ FEVER SHOULD BE REFERRED TO PCP FOR E-VISIT* ________________________________________________________________________________  Cardiac Questionnaire:    Since your last visit or hospitalization:    1. Have you been having chest pain? NO   2. Have you been having shortness of breath? NO   3. Have you been having increasing edema, wt gain, or increase in abdominal girth (pants fitting more tightly)? NO   4. Have you had any passing out spells? NO    Per Dr. Meda Coffee she wants to see this pt in clinic as planned for 07/14/18 at [facility], for he has a known history of Severe MR.  Pt will come in as scheduled .

## 2018-07-09 NOTE — Telephone Encounter (Signed)
Patient returned your call.

## 2018-07-09 NOTE — Telephone Encounter (Signed)
Pt did not call me back, he was only calling our scheduling dept back to confirm he is coming for his appt with [name] on 07/14/18 at [facility].

## 2018-07-13 ENCOUNTER — Telehealth: Payer: Self-pay | Admitting: Cardiology

## 2018-07-13 NOTE — Telephone Encounter (Signed)
I have talked to the patient, he is doing very well from the cardiac standpoint, denies any symptoms of chest pain, dyspnea, lower extremity edema, PND, orthopnea or palpitations. Given that he is on bi-weekly chemo infusions he is considered a very high risk for Covid 19 infection complications.  We will postpone his visit and will try to arrange for a e-visit once platform is available in our clinic. For now postponed for 2-4 weeks.  [name], MD 07/13/2018

## 2018-07-14 ENCOUNTER — Ambulatory Visit: Payer: No Typology Code available for payment source | Admitting: Cardiology

## 2018-07-14 ENCOUNTER — Other Ambulatory Visit: Payer: Self-pay | Admitting: Medical Oncology

## 2018-07-14 DIAGNOSIS — C3490 Malignant neoplasm of unspecified part of unspecified bronchus or lung: Secondary | ICD-10-CM

## 2018-07-14 NOTE — Telephone Encounter (Signed)
Pt was identified as potential e-visit candidate. I spoke to him to pilot and he does not wish to participate in e-visit, will remain on pool for routine rescheduling.

## 2018-07-14 NOTE — Telephone Encounter (Signed)
Will cancel the pts appt for today 3/23 with [name].  Will send this message to our COVID19 cancellation pool with PRIORITY FOLLOW-UP AS A #1, PER DR NELSON.

## 2018-07-15 ENCOUNTER — Ambulatory Visit (HOSPITAL_COMMUNITY)
Admission: RE | Admit: 2018-07-15 | Discharge: 2018-07-15 | Disposition: A | Discharge status: Home / Self Care | Payer: No Typology Code available for payment source | Source: Ambulatory Visit | Attending: Physician Assistant | Admitting: Physician Assistant

## 2018-07-15 ENCOUNTER — Other Ambulatory Visit: Payer: Self-pay

## 2018-07-15 ENCOUNTER — Telehealth: Payer: Self-pay | Admitting: Physician Assistant

## 2018-07-15 DIAGNOSIS — C3492 Malignant neoplasm of unspecified part of left bronchus or lung: Secondary | ICD-10-CM | POA: Diagnosis present

## 2018-07-15 DIAGNOSIS — C3490 Malignant neoplasm of unspecified part of unspecified bronchus or lung: Secondary | ICD-10-CM | POA: Diagnosis present

## 2018-07-15 MED ORDER — SODIUM CHLORIDE (PF) 0.9 % IJ SOLN
INTRAMUSCULAR | Status: AC
  Filled 2018-07-15: qty 50

## 2018-07-15 MED ORDER — IOHEXOL 300 MG/ML  SOLN
100.0000 mL | Freq: Once | INTRAMUSCULAR | Status: AC | PRN
  Administered 2018-07-15: 100 mL via INTRAVENOUS

## 2018-07-15 NOTE — Telephone Encounter (Signed)
Opened in error

## 2018-07-16 ENCOUNTER — Other Ambulatory Visit: Payer: No Typology Code available for payment source

## 2018-07-16 ENCOUNTER — Ambulatory Visit: Payer: No Typology Code available for payment source

## 2018-07-16 ENCOUNTER — Ambulatory Visit: Payer: No Typology Code available for payment source | Admitting: Internal Medicine

## 2018-07-17 ENCOUNTER — Inpatient Hospital Stay (HOSPITAL_BASED_OUTPATIENT_CLINIC_OR_DEPARTMENT_OTHER): Payer: No Typology Code available for payment source | Admitting: Internal Medicine

## 2018-07-17 ENCOUNTER — Inpatient Hospital Stay: Payer: No Typology Code available for payment source

## 2018-07-17 ENCOUNTER — Other Ambulatory Visit: Payer: Self-pay

## 2018-07-17 ENCOUNTER — Encounter: Payer: Self-pay | Admitting: Internal Medicine

## 2018-07-17 VITALS — BP 146/91 | HR 101 | Temp 98.1°F | Resp 20 | Ht 75.0 in | Wt 159.5 lb

## 2018-07-17 DIAGNOSIS — C7951 Secondary malignant neoplasm of bone: Secondary | ICD-10-CM

## 2018-07-17 DIAGNOSIS — C3432 Malignant neoplasm of lower lobe, left bronchus or lung: Secondary | ICD-10-CM

## 2018-07-17 DIAGNOSIS — C7800 Secondary malignant neoplasm of unspecified lung: Secondary | ICD-10-CM

## 2018-07-17 DIAGNOSIS — Z5111 Encounter for antineoplastic chemotherapy: Secondary | ICD-10-CM

## 2018-07-17 DIAGNOSIS — C3492 Malignant neoplasm of unspecified part of left bronchus or lung: Secondary | ICD-10-CM

## 2018-07-17 DIAGNOSIS — Z5112 Encounter for antineoplastic immunotherapy: Secondary | ICD-10-CM

## 2018-07-17 LAB — CBC WITH DIFFERENTIAL (CANCER CENTER ONLY)
Abs Immature Granulocytes: 0.03 10*3/uL (ref 0.00–0.07)
Basophils Absolute: 0.1 10*3/uL (ref 0.0–0.1)
Basophils Relative: 1 %
Eosinophils Absolute: 0.3 10*3/uL (ref 0.0–0.5)
Eosinophils Relative: 4 %
HCT: 40.1 % (ref 39.0–52.0)
Hemoglobin: 12.8 g/dL — ABNORMAL LOW (ref 13.0–17.0)
Immature Granulocytes: 0 %
Lymphocytes Relative: 22 %
Lymphs Abs: 1.5 10*3/uL (ref 0.7–4.0)
MCH: 32.4 pg (ref 26.0–34.0)
MCHC: 31.9 g/dL (ref 30.0–36.0)
MCV: 101.5 fL — AB (ref 80.0–100.0)
MONOS PCT: 16 %
Monocytes Absolute: 1.2 10*3/uL — ABNORMAL HIGH (ref 0.1–1.0)
Neutro Abs: 4 10*3/uL (ref 1.7–7.7)
Neutrophils Relative %: 57 %
Platelet Count: 458 10*3/uL — ABNORMAL HIGH (ref 150–400)
RBC: 3.95 MIL/uL — ABNORMAL LOW (ref 4.22–5.81)
RDW: 15.9 % — ABNORMAL HIGH (ref 11.5–15.5)
WBC Count: 7.1 10*3/uL (ref 4.0–10.5)
nRBC: 0 % (ref 0.0–0.2)

## 2018-07-17 LAB — CMP (CANCER CENTER ONLY)
ALBUMIN: 3.5 g/dL (ref 3.5–5.0)
ALT: 28 U/L (ref 0–44)
AST: 37 U/L (ref 15–41)
Alkaline Phosphatase: 95 U/L (ref 38–126)
Anion gap: 10 (ref 5–15)
BILIRUBIN TOTAL: 0.5 mg/dL (ref 0.3–1.2)
BUN: 8 mg/dL (ref 8–23)
CO2: 20 mmol/L — ABNORMAL LOW (ref 22–32)
Calcium: 9 mg/dL (ref 8.9–10.3)
Chloride: 107 mmol/L (ref 98–111)
Creatinine: 1.19 mg/dL (ref 0.61–1.24)
GFR, Est AFR Am: >60 mL/min (ref >60)
Glucose, Bld: 92 mg/dL (ref 70–99)
Potassium: 4.1 mmol/L (ref 3.5–5.1)
Sodium: 137 mmol/L (ref 135–145)
Total Protein: 7.5 g/dL (ref 6.5–8.1)

## 2018-07-17 LAB — TSH: TSH: 2.736 u[IU]/mL (ref 0.320–4.118)

## 2018-07-17 MED ORDER — DEXAMETHASONE SODIUM PHOSPHATE 10 MG/ML IJ SOLN
INTRAMUSCULAR | Status: AC
  Filled 2018-07-17: qty 1

## 2018-07-17 MED ORDER — SODIUM CHLORIDE 0.9 % IV SOLN
Freq: Once | INTRAVENOUS | Status: AC
  Administered 2018-07-17: 09:00:00 via INTRAVENOUS
  Filled 2018-07-17: qty 250

## 2018-07-17 MED ORDER — SODIUM CHLORIDE 0.9 % IV SOLN
200.0000 mg | Freq: Once | INTRAVENOUS | Status: AC
  Administered 2018-07-17: 200 mg via INTRAVENOUS
  Filled 2018-07-17: qty 8

## 2018-07-17 MED ORDER — SODIUM CHLORIDE 0.9 % IV SOLN
525.0000 mg/m2 | Freq: Once | INTRAVENOUS | Status: AC
  Administered 2018-07-17: 1000 mg via INTRAVENOUS
  Filled 2018-07-17: qty 40

## 2018-07-17 MED ORDER — ONDANSETRON HCL 4 MG/2ML IJ SOLN
8.0000 mg | Freq: Once | INTRAMUSCULAR | Status: AC
  Administered 2018-07-17: 8 mg via INTRAVENOUS

## 2018-07-17 MED ORDER — ONDANSETRON HCL 4 MG/2ML IJ SOLN
INTRAMUSCULAR | Status: AC
  Filled 2018-07-17: qty 4

## 2018-07-17 MED ORDER — DEXAMETHASONE SODIUM PHOSPHATE 10 MG/ML IJ SOLN
10.0000 mg | Freq: Once | INTRAMUSCULAR | Status: AC
  Administered 2018-07-17: 10 mg via INTRAVENOUS

## 2018-07-17 NOTE — Patient Instructions (Signed)
[facility] Discharge Instructions for Patients Receiving Chemotherapy  Today you received the following chemotherapy agents :  Alimta & Keytruda  To help prevent nausea and vomiting after your treatment, we encourage you to take your nausea medication as prescribed.   If you develop nausea and vomiting that is not controlled by your nausea medication, call the clinic.   BELOW ARE SYMPTOMS THAT SHOULD BE REPORTED IMMEDIATELY:  *FEVER GREATER THAN 100.5 F  *CHILLS WITH OR WITHOUT FEVER  NAUSEA AND VOMITING THAT IS NOT CONTROLLED WITH YOUR NAUSEA MEDICATION  *UNUSUAL SHORTNESS OF BREATH  *UNUSUAL BRUISING OR BLEEDING  TENDERNESS IN MOUTH AND THROAT WITH OR WITHOUT PRESENCE OF ULCERS  *URINARY PROBLEMS  *BOWEL PROBLEMS  UNUSUAL RASH Items with * indicate a potential emergency and should be followed up as soon as possible.  Feel free to call the clinic should you have any questions or concerns. The clinic phone number is [phone].  Please show the [facility] at check-in to the Emergency Department and triage nurse.

## 2018-07-17 NOTE — Progress Notes (Signed)
[name]   [phone]  OFFICE PROGRESS NOTE  Patient, No Pcp Per No address on file  DIAGNOSIS: Metastatic non-small cell lung cancer, adenocarcinoma initially diagnosed as stage IB (T2a, N0, M0) non-small cell lung cancer, adenocarcinoma diagnosed in December 2017.  The patient has evidence for disease recurrence in July 2019.  Biomarker Findings Tumor Mutational Burden - TMB-Intermediate (16 Muts/Mb) Microsatellite status - MS-Stable Genomic Findings For a complete list of the genes assayed, please refer to the Appendix. KIT amplification KRAS G88P SMAD4 splice site [id number]>Y VO59 I232F 7 Disease relevant genes with no reportable alterations: EGFR, ALK, BRAF, MET, RET, ERBB2, ROS1   PDL 1 expression is 0%  PRIOR THERAPY:  1) status post left lower lobectomy as well as wedge resection of the left upper lobe on 05/11/2016. 2) Adjuvant systemic chemotherapy with cisplatin 75 MG/M2 and Alimta 500 MG/M2 every 3 weeks. First dose 07/03/2016. Status post 4 cycles.  CURRENT THERAPY: Systemic chemotherapy with carboplatin for AUC of 5, Alimta 500 mg/M2 and Keytruda 200 mg IV every 3 weeks.  Status post 10 cycles.  Starting from cycle #5 the patient will be treated with maintenance Alimta and Ketruda (pembrolizumab) every 3 weeks.  INTERVAL HISTORY: William Kales [name] 63 y.o. male returns to the clinic today for follow-up visit.  The patient is feeling fine today with no concerning complaints.  He denied having any chest pain, shortness of breath, cough or hemoptysis.  He denied having any fever or chills.  He has no nausea, vomiting, diarrhea or constipation.  He has no headache or visual changes.  The patient had repeat CT scan of the chest performed recently and he is here for evaluation and discussion of his scan results.  MEDICAL HISTORY: Past Medical History:  Diagnosis Date  . AAA (abdominal aortic aneurysm) ([place])   . AAA  (abdominal aortic aneurysm) without rupture ([place]) 02/04/2014  . Cancer ([place])    lung  . Encounter for antineoplastic chemotherapy 06/06/2016  . History of hepatitis B   . HNP (herniated nucleus pulposus), lumbar    L4 with radiculopathy  . Hypertension   . Lung cancer ([place]) 05/18/2016  . Malignant neoplasm of lower lobe of left lung ([place]) 04/05/2016  . Mass of left lung   . Mitral insufficiency 04/06/2016   This patient will eventually need MV repair if his prognosis is good from oncology standpoint. However, his lung cancer therapy  is the priority right now. His cardiac condition won't preclude possible lung surgery.  Once his lung cancer is under control he will need a TEE to further evaluate his mitral valve anatomy and MR severity, and also have ischemic workup as tere is evidence on calcificati  . Renal insufficiency 10/09/2016  . S/P partial lobectomy of lung 05/11/2016  . Varicose veins of legs     ALLERGIES:  is allergic to tramadol.  MEDICATIONS:  Current Outpatient Medications  Medication Sig Dispense Refill  . folic acid (FOLVITE) 1 MG tablet TAKE 1 TABLET BY MOUTH EVERY DAY 90 tablet 1  . lisinopril (PRINIVIL,ZESTRIL) 2.5 MG tablet Take 1 tablet (2.5 mg total) by mouth daily. 30 tablet 11   No current facility-administered medications for this visit.     SURGICAL HISTORY:  Past Surgical History:  Procedure Laterality Date  . ABDOMINAL AORTIC ENDOVASCULAR STENT GRAFT N/A 03/12/2016   Procedure: ABDOMINAL AORTIC ENDOVASCULAR STENT GRAFT;  Surgeon: Conrad Colville, MD;  Location:  [facility] OR;  Service: Vascular;  Laterality: N/A;  . COLONOSCOPY    . INGUINAL HERNIA REPAIR Left 1975   Left inguinal hernia  . INGUINAL HERNIA REPAIR Right   . LOBECTOMY Left 05/11/2016   Procedure: LEFT LOWER LOBE LOBECTOMY AND LEFT UPPER LOBE  RESECTION AND PLACEMENT OF ON-Q;  Surgeon: Edward B Gerhardt, MD;  Location: [facility] OR;  Service: Thoracic;  Laterality: Left;  . LUMBAR LAMINECTOMY  October 22, 2012   . LYMPH NODE DISSECTION Left 05/11/2016   Procedure: LYMPH NODE DISSECTION;  Surgeon: Edward B Gerhardt, MD;  Location: [facility] OR;  Service: Thoracic;  Laterality: Left;  . STAPLING OF BLEBS Left 05/11/2016   Procedure: STAPLING OF APICAL BLEB;  Surgeon: Edward B Gerhardt, MD;  Location: [facility] OR;  Service: Thoracic;  Laterality: Left;  . VIDEO ASSISTED THORACOSCOPY Left 05/18/2016   Procedure: VIDEO ASSISTED THORACOSCOPY WITH REMOVAL OF LEFT APICAL BLEB;  Surgeon: Edward B Gerhardt, MD;  Location: [facility];  Service: Thoracic;  Laterality: Left;  . VIDEO ASSISTED THORACOSCOPY (VATS)/WEDGE RESECTION Left 05/11/2016   Procedure: LEFT VIDEO ASSISTED THORACOSCOPY (VATS);  Surgeon: Edward B Gerhardt, MD;  Location: [facility];  Service: Thoracic;  Laterality: Left;  . VIDEO BRONCHOSCOPY N/A 05/11/2016   Procedure: VIDEO BRONCHOSCOPY, LEFT LUNG;  Surgeon: Edward B Gerhardt, MD;  Location: [facility];  Service: Thoracic;  Laterality: N/A;  . VIDEO BRONCHOSCOPY N/A 05/18/2016   Procedure: VIDEO BRONCHOSCOPY WITH BRONCHIAL WASHING;  Surgeon: Edward B Gerhardt, MD;  Location: [facility];  Service: Thoracic;  Laterality: N/A;    REVIEW OF SYSTEMS:  Constitutional: positive for fatigue Eyes: negative Ears, nose, mouth, throat, and face: negative Respiratory: negative Cardiovascular: negative Gastrointestinal: negative Genitourinary:negative Integument/breast: negative Hematologic/lymphatic: negative Musculoskeletal:negative Neurological: negative Behavioral/Psych: negative Endocrine: negative Allergic/Immunologic: negative   PHYSICAL EXAMINATION: General appearance: alert, cooperative, fatigued and no distress Head: Normocephalic, without obvious abnormality, atraumatic Neck: no adenopathy, no JVD, supple, symmetrical, trachea midline and thyroid not enlarged, symmetric, no tenderness/mass/nodules Lymph nodes: Cervical, supraclavicular, and axillary nodes normal. Resp: clear to auscultation bilaterally Back: symmetric, no  curvature. ROM normal. No CVA tenderness. Cardio: regular rate and rhythm, S1, S2 normal, no murmur, click, rub or gallop GI: soft, non-tender; bowel sounds normal; no masses,  no organomegaly Extremities: extremities normal, atraumatic, no cyanosis or edema Neurologic: Alert and oriented X 3, normal strength and tone. Normal symmetric reflexes. Normal coordination and gait   ECOG PERFORMANCE STATUS: 1 - Symptomatic but completely ambulatory  Blood pressure (!) 146/91, pulse (!) 101, temperature 98.1 F (36.7 C), temperature source Oral, resp. rate 20, height 6' 3" (1.905 m), weight 159 lb 8 oz (72.3 kg), SpO2 95 %.  LABORATORY DATA: Lab Results  Component Value Date   WBC 7.1 07/17/2018   HGB 12.8 (L) 07/17/2018   HCT 40.1 07/17/2018   MCV 101.5 (H) 07/17/2018   PLT 458 (H) 07/17/2018      Chemistry      Component Value Date/Time   NA 135 06/26/2018 1129   NA 138 01/11/2017 1343   K 4.5 06/26/2018 1129   K 4.7 01/11/2017 1343   CL 109 06/26/2018 1129   CO2 17 (L) 06/26/2018 1129   CO2 23 01/11/2017 1343   BUN 12 06/26/2018 1129   BUN 20.2 01/11/2017 1343   CREATININE 1.29 (H) 06/26/2018 1129   CREATININE 1.34 (H) 06/04/2018 1050   CREATININE 1.6 (H) 01/11/2017 1343      Component Value Date/Time   CALCIUM 9.1 06/26/2018 1129     CALCIUM 9.3 01/11/2017 1343   ALKPHOS 94 06/26/2018 1129   ALKPHOS 89 01/11/2017 1343   AST 47 (H) 06/26/2018 1129   AST 50 (H) 06/04/2018 1050   AST 41 (H) 01/11/2017 1343   ALT 29 06/26/2018 1129   ALT 38 06/04/2018 1050   ALT 27 01/11/2017 1343   BILITOT 0.6 06/26/2018 1129   BILITOT 0.3 06/04/2018 1050   BILITOT 0.49 01/11/2017 1343       RADIOGRAPHIC STUDIES: Ct Chest W Contrast  Result Date: 07/15/2018 CLINICAL DATA:  Metastatic non-small cell lung cancer. EXAM: CT CHEST AND ABDOMEN WITHOUT CONTRAST TECHNIQUE: Multidetector CT imaging of the chest and abdomen was performed following the standard protocol without intravenous  contrast. COMPARISON:  05/07/2018 FINDINGS: CT CHEST FINDINGS WITHOUT CONTRAST Cardiovascular: The heart size is normal. No substantial pericardial effusion. Atherosclerotic calcification is noted in the wall of the thoracic aorta. Mediastinum/Nodes: No mediastinal lymphadenopathy. Small soft tissue density lesion anterior to the ascending aorta (38/2) is stable in the interval. There is no hilar lymphadenopathy. The esophagus has normal imaging features. There is no axillary lymphadenopathy. Tiny thyroid nodule stable. Lungs/Pleura: The central tracheobronchial airways are patent. Centrilobular emphsyema noted. 4 mm anterior right upper lobe nodule (84/6) is new in the interval. 5 mm nodule posterior right costophrenic sulcus (146/6) is stable. Postsurgical changes noted parahilar left lung. Stable appearance pleural calcification and thickening posterior right hemithorax. Musculoskeletal: Stabile expansile sclerotic lesion in the T4 spinous process. CT ABDOMEN FINDINGS WITHOUT CONTRAST Hepatobiliary: No suspicious focal abnormality within the liver parenchyma. There is no evidence for gallstones, gallbladder wall thickening, or pericholecystic fluid. No intrahepatic or extrahepatic biliary dilation. Pancreas: No focal mass lesion. No dilatation of the main duct. No intraparenchymal cyst. No peripancreatic edema. Spleen: No splenomegaly. No focal mass lesion. Adrenals/Urinary Tract: No adrenal nodule or mass. Kidneys unremarkable. Stomach/Bowel: Stomach is unremarkable. No gastric wall thickening. No evidence of outlet obstruction. Duodenum is normally positioned as is the ligament of Treitz. No small bowel or colonic dilatation within the visualized abdomen. Vascular/Lymphatic: Status post aortic endograft placement. There is no gastrohepatic or hepatoduodenal ligament lymphadenopathy. No intraperitoneal or retroperitoneal lymphadenopathy. Other: No intraperitoneal free fluid. Musculoskeletal: No worrisome lytic  or sclerotic osseous abnormality. IMPRESSION: 1. New 4 mm anterior right upper lobe pulmonary nodule. Attention on follow-up recommended. 5 mm nodule posterior right lower lobe stable since prior. 2. Stable appearance expansile, sclerotic lesion in the T4 spinous process. No new worrisome lytic or sclerotic osseous abnormality. 3. Interval marked improvement of the patchy ground-glass opacity and interstitial disease noted previously in the right lower lung. 4. Small nonspecific soft tissue density anterior to the ascending aorta identified on previous study is stable in the interval. Continued attention on follow-up recommended. 5. No evidence for metastatic disease in the abdomen. 6.  Aortic Atherosclerois (ICD10-170.0) 7.  Emphysema. (ICD10-J43.9) Electronically Signed   By: Eric  Mansell M.D.   On: 07/15/2018 15:19   Ct Abdomen W Contrast  Result Date: 07/15/2018 CLINICAL DATA:  Metastatic non-small cell lung cancer. EXAM: CT CHEST AND ABDOMEN WITHOUT CONTRAST TECHNIQUE: Multidetector CT imaging of the chest and abdomen was performed following the standard protocol without intravenous contrast. COMPARISON:  05/07/2018 FINDINGS: CT CHEST FINDINGS WITHOUT CONTRAST Cardiovascular: The heart size is normal. No substantial pericardial effusion. Atherosclerotic calcification is noted in the wall of the thoracic aorta. Mediastinum/Nodes: No mediastinal lymphadenopathy. Small soft tissue density lesion anterior to the ascending aorta (38/2) is stable in the interval. There is no hilar lymphadenopathy.   The esophagus has normal imaging features. There is no axillary lymphadenopathy. Tiny thyroid nodule stable. Lungs/Pleura: The central tracheobronchial airways are patent. Centrilobular emphsyema noted. 4 mm anterior right upper lobe nodule (84/6) is new in the interval. 5 mm nodule posterior right costophrenic sulcus (146/6) is stable. Postsurgical changes noted parahilar left lung. Stable appearance pleural  calcification and thickening posterior right hemithorax. Musculoskeletal: Stabile expansile sclerotic lesion in the T4 spinous process. CT ABDOMEN FINDINGS WITHOUT CONTRAST Hepatobiliary: No suspicious focal abnormality within the liver parenchyma. There is no evidence for gallstones, gallbladder wall thickening, or pericholecystic fluid. No intrahepatic or extrahepatic biliary dilation. Pancreas: No focal mass lesion. No dilatation of the main duct. No intraparenchymal cyst. No peripancreatic edema. Spleen: No splenomegaly. No focal mass lesion. Adrenals/Urinary Tract: No adrenal nodule or mass. Kidneys unremarkable. Stomach/Bowel: Stomach is unremarkable. No gastric wall thickening. No evidence of outlet obstruction. Duodenum is normally positioned as is the ligament of Treitz. No small bowel or colonic dilatation within the visualized abdomen. Vascular/Lymphatic: Status post aortic endograft placement. There is no gastrohepatic or hepatoduodenal ligament lymphadenopathy. No intraperitoneal or retroperitoneal lymphadenopathy. Other: No intraperitoneal free fluid. Musculoskeletal: No worrisome lytic or sclerotic osseous abnormality. IMPRESSION: 1. New 4 mm anterior right upper lobe pulmonary nodule. Attention on follow-up recommended. 5 mm nodule posterior right lower lobe stable since prior. 2. Stable appearance expansile, sclerotic lesion in the T4 spinous process. No new worrisome lytic or sclerotic osseous abnormality. 3. Interval marked improvement of the patchy ground-glass opacity and interstitial disease noted previously in the right lower lung. 4. Small nonspecific soft tissue density anterior to the ascending aorta identified on previous study is stable in the interval. Continued attention on follow-up recommended. 5. No evidence for metastatic disease in the abdomen. 6.  Aortic Atherosclerois (ICD10-170.0) 7.  Emphysema. (ICD10-J43.9) Electronically Signed   By: Eric  Mansell M.D.   On: 07/15/2018 15:19     ASSESSMENT AND PLAN:  This is a very pleasant 62 years old white male with a stage IB non-small cell lung cancer, adenocarcinoma status post wedge resection of the left upper lobe followed by 4 cycles of adjuvant systemic chemotherapy with cisplatin and Alimta and he tolerated his treatment well except for fatigue. The patient has been on observation since June 2018.   The recent imaging studies including CT scan of the chest as well as a PET scan showed evidence for disease recurrence with metastatic disease presented with bilateral pulmonary nodules as well as destructive bone lesions at T4 vertebral body, biopsy proven to be metastatic adenocarcinoma. Molecular studies by foundation 1 showed no actionable mutations.  PDL 1 expression was negative. The patient is currently undergoing treatment with carboplatin, Alimta and Ketruda (pembrolizumab) status post 10 cycles. Starting from cycle #5 the patient will be treated with maintenance Alimta and Ketruda (pembrolizumab) every 3 weeks. The patient has been tolerating this treatment well with no concerning adverse effects. He had repeat CT scan of the chest, abdomen and pelvis performed recently.  I personally and independently reviewed the scan images and discussed the results with the patient today. His a scan showed no concerning findings for disease progression except for tiny 4 mm anterior right upper lobe pulmonary nodule that require attention on follow-up imaging. I recommended for the patient to continue his current treatment with maintenance Alimta and Keytruda and he will proceed with cycle #11 today. The patient will come back for follow-up visit in 3 weeks for evaluation before the next cycle of his treatment. He   was advised to call immediately if he has any concerning symptoms in the interval. The patient voices understanding of current disease status and treatment options and is in agreement with the current care plan. All questions  were answered. The patient knows to call the clinic with any problems, questions or concerns. We can certainly see the patient much sooner if necessary.  Disclaimer: This note was dictated with voice recognition software. Similar sounding words can inadvertently be transcribed and may not be corrected upon review.       

## 2018-08-07 ENCOUNTER — Other Ambulatory Visit: Payer: Self-pay

## 2018-08-07 ENCOUNTER — Encounter: Payer: Self-pay | Admitting: Internal Medicine

## 2018-08-07 ENCOUNTER — Inpatient Hospital Stay: Payer: No Typology Code available for payment source | Admitting: Internal Medicine

## 2018-08-07 ENCOUNTER — Inpatient Hospital Stay: Payer: No Typology Code available for payment source

## 2018-08-07 ENCOUNTER — Inpatient Hospital Stay: Payer: No Typology Code available for payment source | Attending: Internal Medicine

## 2018-08-07 VITALS — BP 156/90 | HR 88 | Temp 97.9°F | Resp 20 | Ht 75.0 in | Wt 159.1 lb

## 2018-08-07 DIAGNOSIS — Z5112 Encounter for antineoplastic immunotherapy: Secondary | ICD-10-CM | POA: Insufficient documentation

## 2018-08-07 DIAGNOSIS — E041 Nontoxic single thyroid nodule: Secondary | ICD-10-CM | POA: Diagnosis not present

## 2018-08-07 DIAGNOSIS — J439 Emphysema, unspecified: Secondary | ICD-10-CM

## 2018-08-07 DIAGNOSIS — C3492 Malignant neoplasm of unspecified part of left bronchus or lung: Secondary | ICD-10-CM

## 2018-08-07 DIAGNOSIS — C3432 Malignant neoplasm of lower lobe, left bronchus or lung: Secondary | ICD-10-CM | POA: Diagnosis present

## 2018-08-07 DIAGNOSIS — Z79899 Other long term (current) drug therapy: Secondary | ICD-10-CM

## 2018-08-07 DIAGNOSIS — I10 Essential (primary) hypertension: Secondary | ICD-10-CM

## 2018-08-07 DIAGNOSIS — Z5111 Encounter for antineoplastic chemotherapy: Secondary | ICD-10-CM | POA: Diagnosis present

## 2018-08-07 LAB — CBC WITH DIFFERENTIAL (CANCER CENTER ONLY)
Abs Immature Granulocytes: 0.02 10*3/uL (ref 0.00–0.07)
Basophils Absolute: 0.1 10*3/uL (ref 0.0–0.1)
Basophils Relative: 1 %
Eosinophils Absolute: 0.2 10*3/uL (ref 0.0–0.5)
Eosinophils Relative: 4 %
HCT: 42.9 % (ref 39.0–52.0)
Hemoglobin: 13.4 g/dL (ref 13.0–17.0)
Immature Granulocytes: 0 %
Lymphocytes Relative: 25 %
Lymphs Abs: 1.5 10*3/uL (ref 0.7–4.0)
MCH: 32.6 pg (ref 26.0–34.0)
MCHC: 31.2 g/dL (ref 30.0–36.0)
MCV: 104.4 fL — ABNORMAL HIGH (ref 80.0–100.0)
Monocytes Absolute: 1 10*3/uL (ref 0.1–1.0)
Monocytes Relative: 17 %
Neutro Abs: 3.2 10*3/uL (ref 1.7–7.7)
Neutrophils Relative %: 53 %
Platelet Count: 444 10*3/uL — ABNORMAL HIGH (ref 150–400)
RBC: 4.11 MIL/uL — ABNORMAL LOW (ref 4.22–5.81)
RDW: 16.5 % — ABNORMAL HIGH (ref 11.5–15.5)
WBC Count: 6 10*3/uL (ref 4.0–10.5)
nRBC: 0 % (ref 0.0–0.2)

## 2018-08-07 LAB — CMP (CANCER CENTER ONLY)
ALT: 19 U/L (ref 0–44)
AST: 35 U/L (ref 15–41)
Albumin: 3.6 g/dL (ref 3.5–5.0)
Alkaline Phosphatase: 89 U/L (ref 38–126)
Anion gap: 10 (ref 5–15)
BUN: 9 mg/dL (ref 8–23)
CO2: 21 mmol/L — ABNORMAL LOW (ref 22–32)
Calcium: 8.7 mg/dL — ABNORMAL LOW (ref 8.9–10.3)
Chloride: 107 mmol/L (ref 98–111)
Creatinine: 1.17 mg/dL (ref 0.61–1.24)
GFR, Est AFR Am: >60 mL/min (ref >60)
GFR, Estimated: >60 mL/min (ref >60)
Glucose, Bld: 71 mg/dL (ref 70–99)
Potassium: 4.2 mmol/L (ref 3.5–5.1)
Sodium: 138 mmol/L (ref 135–145)
Total Bilirubin: 0.4 mg/dL (ref 0.3–1.2)
Total Protein: 7.5 g/dL (ref 6.5–8.1)

## 2018-08-07 LAB — TSH: TSH: 1.513 u[IU]/mL (ref 0.320–4.118)

## 2018-08-07 MED ORDER — SODIUM CHLORIDE 0.9 % IV SOLN
200.0000 mg | Freq: Once | INTRAVENOUS | Status: AC
  Administered 2018-08-07: 200 mg via INTRAVENOUS
  Filled 2018-08-07: qty 8

## 2018-08-07 MED ORDER — CYANOCOBALAMIN 1000 MCG/ML IJ SOLN
1000.0000 ug | Freq: Once | INTRAMUSCULAR | Status: AC
  Administered 2018-08-07: 1000 ug via INTRAMUSCULAR

## 2018-08-07 MED ORDER — SODIUM CHLORIDE 0.9 % IV SOLN
Freq: Once | INTRAVENOUS | Status: DC
  Filled 2018-08-07: qty 250

## 2018-08-07 MED ORDER — CYANOCOBALAMIN 1000 MCG/ML IJ SOLN
INTRAMUSCULAR | Status: AC
  Filled 2018-08-07: qty 1

## 2018-08-07 MED ORDER — DEXAMETHASONE SODIUM PHOSPHATE 10 MG/ML IJ SOLN
INTRAMUSCULAR | Status: AC
  Filled 2018-08-07: qty 1

## 2018-08-07 MED ORDER — DEXAMETHASONE SODIUM PHOSPHATE 10 MG/ML IJ SOLN
10.0000 mg | Freq: Once | INTRAMUSCULAR | Status: AC
  Administered 2018-08-07: 10 mg via INTRAVENOUS

## 2018-08-07 MED ORDER — ONDANSETRON HCL 4 MG/2ML IJ SOLN
8.0000 mg | Freq: Once | INTRAMUSCULAR | Status: AC
  Administered 2018-08-07: 8 mg via INTRAVENOUS

## 2018-08-07 MED ORDER — SODIUM CHLORIDE 0.9 % IV SOLN
520.0000 mg/m2 | Freq: Once | INTRAVENOUS | Status: AC
  Administered 2018-08-07: 12:00:00 1000 mg via INTRAVENOUS
  Filled 2018-08-07: qty 40

## 2018-08-07 MED ORDER — SODIUM CHLORIDE 0.9 % IV SOLN
Freq: Once | INTRAVENOUS | Status: AC
  Administered 2018-08-07: 10:00:00 via INTRAVENOUS
  Filled 2018-08-07: qty 250

## 2018-08-07 MED ORDER — ONDANSETRON HCL 4 MG/2ML IJ SOLN
INTRAMUSCULAR | Status: AC
  Filled 2018-08-07: qty 4

## 2018-08-07 NOTE — Patient Instructions (Signed)
[facility] Discharge Instructions for Patients Receiving Chemotherapy  Today you received the following chemotherapy agents :  Alimta & Keytruda  To help prevent nausea and vomiting after your treatment, we encourage you to take your nausea medication as prescribed.   If you develop nausea and vomiting that is not controlled by your nausea medication, call the clinic.   BELOW ARE SYMPTOMS THAT SHOULD BE REPORTED IMMEDIATELY:  *FEVER GREATER THAN 100.5 F  *CHILLS WITH OR WITHOUT FEVER  NAUSEA AND VOMITING THAT IS NOT CONTROLLED WITH YOUR NAUSEA MEDICATION  *UNUSUAL SHORTNESS OF BREATH  *UNUSUAL BRUISING OR BLEEDING  TENDERNESS IN MOUTH AND THROAT WITH OR WITHOUT PRESENCE OF ULCERS  *URINARY PROBLEMS  *BOWEL PROBLEMS  UNUSUAL RASH Items with * indicate a potential emergency and should be followed up as soon as possible.  Feel free to call the clinic should you have any questions or concerns. The clinic phone number is [phone].  Please show the [facility] at check-in to the Emergency Department and triage nurse.

## 2018-08-07 NOTE — Progress Notes (Signed)
°  ° °    [facility] °[phone]   [phone] ° °OFFICE PROGRESS NOTE ° °Patient, No Pcp Per °No address on file ° °DIAGNOSIS: Metastatic non-small cell lung cancer, adenocarcinoma initially diagnosed as stage IB (T2a, N0, M0) non-small cell lung cancer, adenocarcinoma diagnosed in December 2017.  The patient has evidence for disease recurrence in July 2019. ° °Biomarker Findings °Tumor Mutational Burden - TMB-Intermediate (16 Muts/Mb) °Microsatellite status - MS-Stable °Genomic Findings °For a complete list of the genes assayed, please refer to the Appendix. °KIT amplification °KRAS G12C °SMAD4 splice site 1309-1G>T °TP53 I232F °7 Disease relevant genes with no reportable alterations: EGFR, ALK, °BRAF, MET, RET, ERBB2, ROS1  ° °PDL 1 expression is 0% ° °PRIOR THERAPY:  °1) status post left lower lobectomy as well as wedge resection of the left upper lobe on 05/11/2016. °2) Adjuvant systemic chemotherapy with cisplatin 75 MG/M2 and Alimta 500 MG/M2 every 3 weeks. First dose 07/03/2016. Status post 4 cycles. ° °CURRENT THERAPY: Systemic chemotherapy with carboplatin for AUC of 5, Alimta 500 mg/M2 and Keytruda 200 mg IV every 3 weeks.  Status post 11 cycles.  Starting from cycle #5 the patient will be treated with maintenance Alimta and Ketruda (pembrolizumab) every 3 weeks. ° °INTERVAL HISTORY: °William K Blomgren [name] 62 y.o. male returns to the clinic today for follow-up visit.  The patient is feeling fine today with no concerning complaints.  He lost his mother yesterday.  She was 99 years old.  He denied having any chest pain, shortness of breath, cough or hemoptysis.  The patient has no nausea, vomiting, diarrhea or constipation.  He denied having any fever or chills.  He has no weight loss or night sweats.  He is here today for evaluation before starting cycle #12 of his treatment. ° °MEDICAL HISTORY: °Past Medical History:  °Diagnosis Date  °• AAA (abdominal aortic aneurysm)  (HCC)   °• AAA (abdominal aortic aneurysm) without rupture (HCC) 02/04/2014  °• Cancer (HCC)   ° lung  °• Encounter for antineoplastic chemotherapy 06/06/2016  °• History of hepatitis B   °• HNP (herniated nucleus pulposus), lumbar   ° L4 with radiculopathy  °• Hypertension   °• Lung cancer (HCC) 05/18/2016  °• Malignant neoplasm of lower lobe of left lung (HCC) 04/05/2016  °• Mass of left lung   °• Mitral insufficiency 04/06/2016  ° This patient will eventually need MV repair if his prognosis is good from oncology standpoint. However, his lung cancer therapy  is the priority right now. His cardiac condition won't preclude possible lung surgery.  Once his lung cancer is under control he will need a TEE to further evaluate his mitral valve anatomy and MR severity, and also have ischemic workup as tere is evidence on calcificati  °• Renal insufficiency 10/09/2016  °• S/P partial lobectomy of lung 05/11/2016  °• Varicose veins of legs   ° ° °ALLERGIES:  is allergic to tramadol. ° °MEDICATIONS:  °Current Outpatient Medications  °Medication Sig Dispense Refill  °• folic acid (FOLVITE) 1 MG tablet TAKE 1 TABLET BY MOUTH EVERY DAY 90 tablet 1  °• lisinopril (PRINIVIL,ZESTRIL) 2.5 MG tablet Take 1 tablet (2.5 mg total) by mouth daily. 30 tablet 11  ° °No current facility-administered medications for this visit.   ° ° °SURGICAL HISTORY:  °Past Surgical History:  °Procedure Laterality Date  °• ABDOMINAL AORTIC ENDOVASCULAR STENT GRAFT N/A 03/12/2016  ° Procedure: ABDOMINAL AORTIC ENDOVASCULAR STENT GRAFT;  Surgeon: Brian L   Starlyn Skeans, MD;  Location: [facility] OR;  Service: Vascular;  Laterality: N/A;   COLONOSCOPY     INGUINAL HERNIA REPAIR Left 1975   Left inguinal hernia   INGUINAL HERNIA REPAIR Right    LOBECTOMY Left 05/11/2016   Procedure: LEFT LOWER LOBE LOBECTOMY AND LEFT UPPER LOBE  RESECTION AND PLACEMENT OF ON-Q;  Surgeon: [name], MD;  Location: [place];  Service: Thoracic;  Laterality: Left;   LUMBAR LAMINECTOMY   October 22, 2012   LYMPH NODE DISSECTION Left 05/11/2016   Procedure: LYMPH NODE DISSECTION;  Surgeon: [name], MD;  Location: [place];  Service: Thoracic;  Laterality: Left;   STAPLING OF BLEBS Left 05/11/2016   Procedure: STAPLING OF APICAL BLEB;  Surgeon: [name], MD;  Location: [place];  Service: Thoracic;  Laterality: Left;   VIDEO ASSISTED THORACOSCOPY Left 05/18/2016   Procedure: VIDEO ASSISTED THORACOSCOPY WITH REMOVAL OF LEFT APICAL BLEB;  Surgeon: [name], MD;  Location: [place];  Service: Thoracic;  Laterality: Left;   VIDEO ASSISTED THORACOSCOPY (VATS)/WEDGE RESECTION Left 05/11/2016   Procedure: LEFT VIDEO ASSISTED THORACOSCOPY (VATS);  Surgeon: [name], MD;  Location: [place];  Service: Thoracic;  Laterality: Left;   VIDEO BRONCHOSCOPY N/A 05/11/2016   Procedure: VIDEO BRONCHOSCOPY, LEFT LUNG;  Surgeon: [name], MD;  Location: [place];  Service: Thoracic;  Laterality: N/A;   VIDEO BRONCHOSCOPY N/A 05/18/2016   Procedure: VIDEO BRONCHOSCOPY WITH BRONCHIAL WASHING;  Surgeon: [name], MD;  Location: [place];  Service: Thoracic;  Laterality: N/A;    REVIEW OF SYSTEMS:  A comprehensive review of systems was negative.   PHYSICAL EXAMINATION: General appearance: alert, cooperative and no distress Head: Normocephalic, without obvious abnormality, atraumatic Neck: no adenopathy, no JVD, supple, symmetrical, trachea midline and thyroid not enlarged, symmetric, no tenderness/mass/nodules Lymph nodes: Cervical, supraclavicular, and axillary nodes normal. Resp: clear to auscultation bilaterally Back: symmetric, no curvature. ROM normal. No CVA tenderness. Cardio: regular rate and rhythm, S1, S2 normal, no murmur, click, rub or gallop GI: soft, non-tender; bowel sounds normal; no masses,  no organomegaly Extremities: extremities normal, atraumatic, no cyanosis or edema   ECOG PERFORMANCE STATUS: 1 - Symptomatic but completely ambulatory  Blood  pressure (!) 156/90, pulse 88, temperature 97.9 F (36.6 C), temperature source Oral, resp. rate 20, height 6' 3" (1.905 m), weight 159 lb 1.6 oz (72.2 kg), SpO2 98 %.  LABORATORY DATA: Lab Results  Component Value Date   WBC 6.0 08/07/2018   HGB 13.4 08/07/2018   HCT 42.9 08/07/2018   MCV 104.4 (H) 08/07/2018   PLT 444 (H) 08/07/2018      Chemistry      Component Value Date/Time   NA 137 07/17/2018 0808   NA 138 01/11/2017 1343   K 4.1 07/17/2018 0808   K 4.7 01/11/2017 1343   CL 107 07/17/2018 0808   CO2 20 (L) 07/17/2018 0808   CO2 23 01/11/2017 1343   BUN 8 07/17/2018 0808   BUN 20.2 01/11/2017 1343   CREATININE 1.19 07/17/2018 0808   CREATININE 1.6 (H) 01/11/2017 1343      Component Value Date/Time   CALCIUM 9.0 07/17/2018 0808   CALCIUM 9.3 01/11/2017 1343   ALKPHOS 95 07/17/2018 0808   ALKPHOS 89 01/11/2017 1343   AST 37 07/17/2018 0808   AST 41 (H) 01/11/2017 1343   ALT 28 07/17/2018 0808   ALT 27 01/11/2017 1343   BILITOT 0.5 07/17/2018 3295  0.49 01/11/2017 1343  °  ° ° ° °RADIOGRAPHIC STUDIES: °Ct Chest W Contrast ° °Result Date: 07/15/2018 °CLINICAL DATA:  Metastatic non-small cell lung cancer. EXAM: CT CHEST AND ABDOMEN WITHOUT CONTRAST TECHNIQUE: Multidetector CT imaging of the chest and abdomen was performed following the standard protocol without intravenous contrast. COMPARISON:  05/07/2018 FINDINGS: CT CHEST FINDINGS WITHOUT CONTRAST Cardiovascular: The heart size is normal. No substantial pericardial effusion. Atherosclerotic calcification is noted in the wall of the thoracic aorta. Mediastinum/Nodes: No mediastinal lymphadenopathy. Small soft tissue density lesion anterior to the ascending aorta (38/2) is stable in the interval. There is no hilar lymphadenopathy. The esophagus has normal imaging features. There is no axillary lymphadenopathy. Tiny thyroid nodule stable. Lungs/Pleura: The central tracheobronchial airways are patent. Centrilobular  emphsyema noted. 4 mm anterior right upper lobe nodule (84/6) is new in the interval. 5 mm nodule posterior right costophrenic sulcus (146/6) is stable. Postsurgical changes noted parahilar left lung. Stable appearance pleural calcification and thickening posterior right hemithorax. Musculoskeletal: Stabile expansile sclerotic lesion in the T4 spinous process. CT ABDOMEN FINDINGS WITHOUT CONTRAST Hepatobiliary: No suspicious focal abnormality within the liver parenchyma. There is no evidence for gallstones, gallbladder wall thickening, or pericholecystic fluid. No intrahepatic or extrahepatic biliary dilation. Pancreas: No focal mass lesion. No dilatation of the main duct. No intraparenchymal cyst. No peripancreatic edema. Spleen: No splenomegaly. No focal mass lesion. Adrenals/Urinary Tract: No adrenal nodule or mass. Kidneys unremarkable. Stomach/Bowel: Stomach is unremarkable. No gastric wall thickening. No evidence of outlet obstruction. Duodenum is normally positioned as is the ligament of Treitz. No small bowel or colonic dilatation within the visualized abdomen. Vascular/Lymphatic: Status post aortic endograft placement. There is no gastrohepatic or hepatoduodenal ligament lymphadenopathy. No intraperitoneal or retroperitoneal lymphadenopathy. Other: No intraperitoneal free fluid. Musculoskeletal: No worrisome lytic or sclerotic osseous abnormality. IMPRESSION: 1. New 4 mm anterior right upper lobe pulmonary nodule. Attention on follow-up recommended. 5 mm nodule posterior right lower lobe stable since prior. 2. Stable appearance expansile, sclerotic lesion in the T4 spinous process. No new worrisome lytic or sclerotic osseous abnormality. 3. Interval marked improvement of the patchy ground-glass opacity and interstitial disease noted previously in the right lower lung. 4. Small nonspecific soft tissue density anterior to the ascending aorta identified on previous study is stable in the interval. Continued  attention on follow-up recommended. 5. No evidence for metastatic disease in the abdomen. 6.  Aortic Atherosclerois (ICD10-170.0) 7.  Emphysema. (ICD10-J43.9) Electronically Signed   By: Eric  Mansell M.D.   On: 07/15/2018 15:19  ° °Ct Abdomen W Contrast ° °Result Date: 07/15/2018 °CLINICAL DATA:  Metastatic non-small cell lung cancer. EXAM: CT CHEST AND ABDOMEN WITHOUT CONTRAST TECHNIQUE: Multidetector CT imaging of the chest and abdomen was performed following the standard protocol without intravenous contrast. COMPARISON:  05/07/2018 FINDINGS: CT CHEST FINDINGS WITHOUT CONTRAST Cardiovascular: The heart size is normal. No substantial pericardial effusion. Atherosclerotic calcification is noted in the wall of the thoracic aorta. Mediastinum/Nodes: No mediastinal lymphadenopathy. Small soft tissue density lesion anterior to the ascending aorta (38/2) is stable in the interval. There is no hilar lymphadenopathy. The esophagus has normal imaging features. There is no axillary lymphadenopathy. Tiny thyroid nodule stable. Lungs/Pleura: The central tracheobronchial airways are patent. Centrilobular emphsyema noted. 4 mm anterior right upper lobe nodule (84/6) is new in the interval. 5 mm nodule posterior right costophrenic sulcus (146/6) is stable. Postsurgical changes noted parahilar left lung. Stable appearance pleural calcification and thickening posterior right hemithorax. Musculoskeletal: Stabile expansile sclerotic lesion in the   the T4 spinous process. CT ABDOMEN FINDINGS WITHOUT CONTRAST Hepatobiliary: No suspicious focal abnormality within the liver parenchyma. There is no evidence for gallstones, gallbladder wall thickening, or pericholecystic fluid. No intrahepatic or extrahepatic biliary dilation. Pancreas: No focal mass lesion. No dilatation of the main duct. No intraparenchymal cyst. No peripancreatic edema. Spleen: No splenomegaly. No focal mass lesion. Adrenals/Urinary Tract: No adrenal nodule or mass. Kidneys  unremarkable. Stomach/Bowel: Stomach is unremarkable. No gastric wall thickening. No evidence of outlet obstruction. Duodenum is normally positioned as is the ligament of Treitz. No small bowel or colonic dilatation within the visualized abdomen. Vascular/Lymphatic: Status post aortic endograft placement. There is no gastrohepatic or hepatoduodenal ligament lymphadenopathy. No intraperitoneal or retroperitoneal lymphadenopathy. Other: No intraperitoneal free fluid. Musculoskeletal: No worrisome lytic or sclerotic osseous abnormality. IMPRESSION: 1. New 4 mm anterior right upper lobe pulmonary nodule. Attention on follow-up recommended. 5 mm nodule posterior right lower lobe stable since prior. 2. Stable appearance expansile, sclerotic lesion in the T4 spinous process. No new worrisome lytic or sclerotic osseous abnormality. 3. Interval marked improvement of the patchy ground-glass opacity and interstitial disease noted previously in the right lower lung. 4. Small nonspecific soft tissue density anterior to the ascending aorta identified on previous study is stable in the interval. Continued attention on follow-up recommended. 5. No evidence for metastatic disease in the abdomen. 6.  Aortic Atherosclerois (ICD10-170.0) 7.  Emphysema. ([id number]) Electronically Signed   By: Misty Stanley M.D.   On: 07/15/2018 15:19    ASSESSMENT AND PLAN:  This is a very pleasant 63 years old white male with a stage IB non-small cell lung cancer, adenocarcinoma status post wedge resection of the left upper lobe followed by 4 cycles of adjuvant systemic chemotherapy with cisplatin and Alimta and he tolerated his treatment well except for fatigue. The patient has been on observation since June 2018.   The recent imaging studies including CT scan of the chest as well as a PET scan showed evidence for disease recurrence with metastatic disease presented with bilateral pulmonary nodules as well as destructive bone lesions at T4  vertebral body, biopsy proven to be metastatic adenocarcinoma. Molecular studies by foundation 1 showed no actionable mutations.  PDL 1 expression was negative. The patient is currently undergoing treatment with carboplatin, Alimta and Ketruda (pembrolizumab) status post 11 cycles. Starting from cycle #5 the patient will be treated with maintenance Alimta and Ketruda (pembrolizumab) every 3 weeks. The patient continues to tolerate this treatment well with no concerning adverse effects. I recommended for him to proceed with cycle #12 today as scheduled. I will see the patient back for follow-up visit in 3 weeks for evaluation before the next cycle of his treatment. For hypertension he was advised to take his blood pressure medication as prescribed and to monitor it closely at home. He was advised to call immediately if he has any concerning symptoms in the interval. The patient voices understanding of current disease status and treatment options and is in agreement with the current care plan. All questions were answered. The patient knows to call the clinic with any problems, questions or concerns. We can certainly see the patient much sooner if necessary.  Disclaimer: This note was dictated with voice recognition software. Similar sounding words can inadvertently be transcribed and may not be corrected upon review.

## 2018-08-28 ENCOUNTER — Encounter: Payer: Self-pay | Admitting: Internal Medicine

## 2018-08-28 ENCOUNTER — Inpatient Hospital Stay: Payer: No Typology Code available for payment source | Attending: Internal Medicine

## 2018-08-28 ENCOUNTER — Other Ambulatory Visit: Payer: Self-pay

## 2018-08-28 ENCOUNTER — Inpatient Hospital Stay (HOSPITAL_BASED_OUTPATIENT_CLINIC_OR_DEPARTMENT_OTHER): Payer: No Typology Code available for payment source | Admitting: Internal Medicine

## 2018-08-28 ENCOUNTER — Inpatient Hospital Stay: Payer: No Typology Code available for payment source

## 2018-08-28 VITALS — BP 135/87 | HR 102 | Temp 97.9°F | Resp 18 | Ht 75.0 in | Wt 158.2 lb

## 2018-08-28 DIAGNOSIS — C3492 Malignant neoplasm of unspecified part of left bronchus or lung: Secondary | ICD-10-CM

## 2018-08-28 DIAGNOSIS — I10 Essential (primary) hypertension: Secondary | ICD-10-CM

## 2018-08-28 DIAGNOSIS — Z79899 Other long term (current) drug therapy: Secondary | ICD-10-CM

## 2018-08-28 DIAGNOSIS — Z5111 Encounter for antineoplastic chemotherapy: Secondary | ICD-10-CM

## 2018-08-28 DIAGNOSIS — C3432 Malignant neoplasm of lower lobe, left bronchus or lung: Secondary | ICD-10-CM | POA: Insufficient documentation

## 2018-08-28 DIAGNOSIS — Z5112 Encounter for antineoplastic immunotherapy: Secondary | ICD-10-CM

## 2018-08-28 DIAGNOSIS — C7951 Secondary malignant neoplasm of bone: Secondary | ICD-10-CM | POA: Insufficient documentation

## 2018-08-28 LAB — CMP (CANCER CENTER ONLY)
ALT: 24 U/L (ref 0–44)
AST: 39 U/L (ref 15–41)
Albumin: 3.6 g/dL (ref 3.5–5.0)
Alkaline Phosphatase: 90 U/L (ref 38–126)
Anion gap: 10 (ref 5–15)
BUN: 12 mg/dL (ref 8–23)
CO2: 19 mmol/L — ABNORMAL LOW (ref 22–32)
Calcium: 8.8 mg/dL — ABNORMAL LOW (ref 8.9–10.3)
Chloride: 110 mmol/L (ref 98–111)
Creatinine: 1.23 mg/dL (ref 0.61–1.24)
GFR, Est AFR Am: >60 mL/min (ref >60)
GFR, Estimated: >60 mL/min (ref >60)
Glucose, Bld: 109 mg/dL — ABNORMAL HIGH (ref 70–99)
Potassium: 4.2 mmol/L (ref 3.5–5.1)
Sodium: 139 mmol/L (ref 135–145)
Total Bilirubin: 0.5 mg/dL (ref 0.3–1.2)
Total Protein: 7.5 g/dL (ref 6.5–8.1)

## 2018-08-28 LAB — CBC WITH DIFFERENTIAL (CANCER CENTER ONLY)
Abs Immature Granulocytes: 0.02 10*3/uL (ref 0.00–0.07)
Basophils Absolute: 0.1 10*3/uL (ref 0.0–0.1)
Basophils Relative: 1 %
Eosinophils Absolute: 0.1 10*3/uL (ref 0.0–0.5)
Eosinophils Relative: 1 %
HCT: 41.5 % (ref 39.0–52.0)
Hemoglobin: 13.2 g/dL (ref 13.0–17.0)
Immature Granulocytes: 0 %
Lymphocytes Relative: 14 %
Lymphs Abs: 1 10*3/uL (ref 0.7–4.0)
MCH: 33 pg (ref 26.0–34.0)
MCHC: 31.8 g/dL (ref 30.0–36.0)
MCV: 103.8 fL — ABNORMAL HIGH (ref 80.0–100.0)
Monocytes Absolute: 1.1 10*3/uL — ABNORMAL HIGH (ref 0.1–1.0)
Monocytes Relative: 14 %
Neutro Abs: 5.2 10*3/uL (ref 1.7–7.7)
Neutrophils Relative %: 70 %
Platelet Count: 409 10*3/uL — ABNORMAL HIGH (ref 150–400)
RBC: 4 MIL/uL — ABNORMAL LOW (ref 4.22–5.81)
RDW: 16 % — ABNORMAL HIGH (ref 11.5–15.5)
WBC Count: 7.5 10*3/uL (ref 4.0–10.5)
nRBC: 0 % (ref 0.0–0.2)

## 2018-08-28 MED ORDER — SODIUM CHLORIDE 0.9 % IV SOLN
Freq: Once | INTRAVENOUS | Status: AC
  Administered 2018-08-28: 13:00:00 via INTRAVENOUS
  Filled 2018-08-28: qty 250

## 2018-08-28 MED ORDER — DEXAMETHASONE SODIUM PHOSPHATE 10 MG/ML IJ SOLN
10.0000 mg | Freq: Once | INTRAMUSCULAR | Status: AC
  Administered 2018-08-28: 10 mg via INTRAVENOUS

## 2018-08-28 MED ORDER — SODIUM CHLORIDE 0.9 % IV SOLN
520.0000 mg/m2 | Freq: Once | INTRAVENOUS | Status: AC
  Administered 2018-08-28: 1000 mg via INTRAVENOUS
  Filled 2018-08-28: qty 40

## 2018-08-28 MED ORDER — ONDANSETRON HCL 4 MG/2ML IJ SOLN
8.0000 mg | Freq: Once | INTRAMUSCULAR | Status: AC
  Administered 2018-08-28: 8 mg via INTRAVENOUS

## 2018-08-28 MED ORDER — SODIUM CHLORIDE 0.9 % IV SOLN
200.0000 mg | Freq: Once | INTRAVENOUS | Status: AC
  Administered 2018-08-28: 200 mg via INTRAVENOUS
  Filled 2018-08-28: qty 8

## 2018-08-28 NOTE — Progress Notes (Signed)
[name] [phone]   [phone]  OFFICE PROGRESS NOTE  Patient, No Pcp Per No address on file  DIAGNOSIS: Metastatic non-small cell lung cancer, adenocarcinoma initially diagnosed as stage IB (T2a, N0, M0) non-small cell lung cancer, adenocarcinoma diagnosed in December 2017.  The patient has evidence for disease recurrence in July 2019.  Biomarker Findings Tumor Mutational Burden - TMB-Intermediate (16 Muts/Mb) Microsatellite status - MS-Stable Genomic Findings For a complete list of the genes assayed, please refer to the Appendix. KIT amplification KRAS C62B SMAD4 splice site 7628-3T>D VV61 I232F 7 Disease relevant genes with no reportable alterations: EGFR, ALK, BRAF, MET, RET, ERBB2, ROS1   PDL 1 expression is 0%  PRIOR THERAPY:  1) status post left lower lobectomy as well as wedge resection of the left upper lobe on 05/11/2016. 2) Adjuvant systemic chemotherapy with cisplatin 75 MG/M2 and Alimta 500 MG/M2 every 3 weeks. First dose 07/03/2016. Status post 4 cycles.  CURRENT THERAPY: Systemic chemotherapy with carboplatin for AUC of 5, Alimta 500 mg/M2 and Keytruda 200 mg IV every 3 weeks.  Status post 12 cycles.  Starting from cycle #5 the patient will be treated with maintenance Alimta and Ketruda (pembrolizumab) every 3 weeks.  INTERVAL HISTORY: William Kales [name] 63 y.o. male returns to the clinic today for follow-up visit.  The patient is feeling fine today with no concerning complaints.  The patient is feeling fine today with no concerning complaints except for mild fatigue.  He denied having any chest pain, shortness of breath, cough or hemoptysis.  He denied having any fever or chills.  He has no nausea, vomiting, diarrhea or constipation.  He denied having any headache or visual changes.  MEDICAL HISTORY: Past Medical History:  Diagnosis Date  . AAA (abdominal aortic aneurysm) ([place])   . AAA (abdominal aortic aneurysm)  without rupture ([place]) 02/04/2014  . Cancer ([place])    lung  . Encounter for antineoplastic chemotherapy 06/06/2016  . History of hepatitis B   . HNP (herniated nucleus pulposus), lumbar    L4 with radiculopathy  . Hypertension   . Lung cancer ([place]) 05/18/2016  . Malignant neoplasm of lower lobe of left lung ([place]) 04/05/2016  . Mass of left lung   . Mitral insufficiency 04/06/2016   This patient will eventually need MV repair if his prognosis is good from oncology standpoint. However, his lung cancer therapy  is the priority right now. His cardiac condition won't preclude possible lung surgery.  Once his lung cancer is under control he will need a TEE to further evaluate his mitral valve anatomy and MR severity, and also have ischemic workup as tere is evidence on calcificati  . Renal insufficiency 10/09/2016  . S/P partial lobectomy of lung 05/11/2016  . Varicose veins of legs     ALLERGIES:  is allergic to tramadol.  MEDICATIONS:  Current Outpatient Medications  Medication Sig Dispense Refill  . folic acid (FOLVITE) 1 MG tablet TAKE 1 TABLET BY MOUTH EVERY DAY 90 tablet 1  . lisinopril (PRINIVIL,ZESTRIL) 2.5 MG tablet Take 1 tablet (2.5 mg total) by mouth daily. 30 tablet 11   No current facility-administered medications for this visit.     SURGICAL HISTORY:  Past Surgical History:  Procedure Laterality Date  . ABDOMINAL AORTIC ENDOVASCULAR STENT GRAFT N/A 03/12/2016   Procedure: ABDOMINAL AORTIC ENDOVASCULAR STENT GRAFT;  Surgeon: Conrad Lavalette, MD;  Location: [place];  Service: Vascular;  Laterality: N/A;  .  COLONOSCOPY    . INGUINAL HERNIA REPAIR Left 1975   Left inguinal hernia  . INGUINAL HERNIA REPAIR Right   . LOBECTOMY Left 05/11/2016   Procedure: LEFT LOWER LOBE LOBECTOMY AND LEFT UPPER LOBE  RESECTION AND PLACEMENT OF ON-Q;  Surgeon: [name], MD;  Location: [place];  Service: Thoracic;  Laterality: Left;  . LUMBAR LAMINECTOMY  October 22, 2012  . LYMPH NODE DISSECTION  Left 05/11/2016   Procedure: LYMPH NODE DISSECTION;  Surgeon: [name], MD;  Location: [place];  Service: Thoracic;  Laterality: Left;  . STAPLING OF BLEBS Left 05/11/2016   Procedure: STAPLING OF APICAL BLEB;  Surgeon: [name], MD;  Location: [place];  Service: Thoracic;  Laterality: Left;  Marland Kitchen VIDEO ASSISTED THORACOSCOPY Left 05/18/2016   Procedure: VIDEO ASSISTED THORACOSCOPY WITH REMOVAL OF LEFT APICAL BLEB;  Surgeon: [name], MD;  Location: [place];  Service: Thoracic;  Laterality: Left;  Marland Kitchen VIDEO ASSISTED THORACOSCOPY (VATS)/WEDGE RESECTION Left 05/11/2016   Procedure: LEFT VIDEO ASSISTED THORACOSCOPY (VATS);  Surgeon: [name], MD;  Location: [place];  Service: Thoracic;  Laterality: Left;  Marland Kitchen VIDEO BRONCHOSCOPY N/A 05/11/2016   Procedure: VIDEO BRONCHOSCOPY, LEFT LUNG;  Surgeon: [name], MD;  Location: [place];  Service: Thoracic;  Laterality: N/A;  . VIDEO BRONCHOSCOPY N/A 05/18/2016   Procedure: VIDEO BRONCHOSCOPY WITH BRONCHIAL WASHING;  Surgeon: [name], MD;  Location: [place];  Service: Thoracic;  Laterality: N/A;    REVIEW OF SYSTEMS:  A comprehensive review of systems was negative except for: Constitutional: positive for fatigue   PHYSICAL EXAMINATION: General appearance: alert, cooperative and no distress Head: Normocephalic, without obvious abnormality, atraumatic Neck: no adenopathy, no JVD, supple, symmetrical, trachea midline and thyroid not enlarged, symmetric, no tenderness/mass/nodules Lymph nodes: Cervical, supraclavicular, and axillary nodes normal. Resp: clear to auscultation bilaterally Back: symmetric, no curvature. ROM normal. No CVA tenderness. Cardio: regular rate and rhythm, S1, S2 normal, no murmur, click, rub or gallop GI: soft, non-tender; bowel sounds normal; no masses,  no organomegaly Extremities: extremities normal, atraumatic, no cyanosis or edema   ECOG PERFORMANCE STATUS: 1 - Symptomatic but completely ambulatory   Blood pressure 135/87, pulse (!) 102, temperature 97.9 F (36.6 C), temperature source Oral, resp. rate 18, height '6\' 3"'  (1.905 m), weight 158 lb 3.2 oz (71.8 kg), SpO2 96 %.  LABORATORY DATA: Lab Results  Component Value Date   WBC 7.5 08/28/2018   HGB 13.2 08/28/2018   HCT 41.5 08/28/2018   MCV 103.8 (H) 08/28/2018   PLT 409 (H) 08/28/2018      Chemistry      Component Value Date/Time   NA 138 08/07/2018 0856   NA 138 01/11/2017 1343   K 4.2 08/07/2018 0856   K 4.7 01/11/2017 1343   CL 107 08/07/2018 0856   CO2 21 (L) 08/07/2018 0856   CO2 23 01/11/2017 1343   BUN 9 08/07/2018 0856   BUN 20.2 01/11/2017 1343   CREATININE 1.17 08/07/2018 0856   CREATININE 1.6 (H) 01/11/2017 1343      Component Value Date/Time   CALCIUM 8.7 (L) 08/07/2018 0856   CALCIUM 9.3 01/11/2017 1343   ALKPHOS 89 08/07/2018 0856   ALKPHOS 89 01/11/2017 1343   AST 35 08/07/2018 0856   AST 41 (H) 01/11/2017 1343   ALT 19 08/07/2018 0856   ALT 27 01/11/2017 1343   BILITOT 0.4 08/07/2018 0856   BILITOT 0.49 01/11/2017 1343  RADIOGRAPHIC STUDIES: No results found.  ASSESSMENT AND PLAN:  This is a very pleasant 62 years old white male with a stage IB non-small cell lung cancer, adenocarcinoma status post wedge resection of the left upper lobe followed by 4 cycles of adjuvant systemic chemotherapy with cisplatin and Alimta and he tolerated his treatment well except for fatigue. The patient has been on observation since June 2018.   The recent imaging studies including CT scan of the chest as well as a PET scan showed evidence for disease recurrence with metastatic disease presented with bilateral pulmonary nodules as well as destructive bone lesions at T4 vertebral body, biopsy proven to be metastatic adenocarcinoma. Molecular studies by foundation 1 showed no actionable mutations.  PDL 1 expression was negative. The patient is currently undergoing treatment with carboplatin, Alimta and  Ketruda (pembrolizumab) status post 12 cycles. Starting from cycle #5 the patient will be treated with maintenance Alimta and Ketruda (pembrolizumab) every 3 weeks. The patient continues to tolerate this treatment well with no concerning adverse effects. I recommended for him to proceed with cycle #16 today as scheduled. I will see him back for follow-up visit in 3 weeks for evaluation before starting cycle #14. For hypertension he was advised to take his blood pressure medication as prescribed and to monitor it closely at home. He was advised to call immediately if he has any concerning symptoms in the interval. The patient voices understanding of current disease status and treatment options and is in agreement with the current care plan. All questions were answered. The patient knows to call the clinic with any problems, questions or concerns. We can certainly see the patient much sooner if necessary.  Disclaimer: This note was dictated with voice recognition software. Similar sounding words can inadvertently be transcribed and may not be corrected upon review.

## 2018-08-28 NOTE — Patient Instructions (Signed)
[facility] Discharge Instructions for Patients Receiving Chemotherapy  Today you received the following chemotherapy agents :  Alimta & Keytruda  To help prevent nausea and vomiting after your treatment, we encourage you to take your nausea medication as prescribed.   If you develop nausea and vomiting that is not controlled by your nausea medication, call the clinic.   BELOW ARE SYMPTOMS THAT SHOULD BE REPORTED IMMEDIATELY:  *FEVER GREATER THAN 100.5 F  *CHILLS WITH OR WITHOUT FEVER  NAUSEA AND VOMITING THAT IS NOT CONTROLLED WITH YOUR NAUSEA MEDICATION  *UNUSUAL SHORTNESS OF BREATH  *UNUSUAL BRUISING OR BLEEDING  TENDERNESS IN MOUTH AND THROAT WITH OR WITHOUT PRESENCE OF ULCERS  *URINARY PROBLEMS  *BOWEL PROBLEMS  UNUSUAL RASH Items with * indicate a potential emergency and should be followed up as soon as possible.  Feel free to call the clinic should you have any questions or concerns. The clinic phone number is [phone].  Please show the [facility] at check-in to the Emergency Department and triage nurse. This information is directly available on the CDC website: RunningShows.co.za.html    Source:CDC Reference to specific commercial products, manufacturers, companies, or trademarks does not constitute its endorsement or recommendation by the [place], [place], or Centers for [employer] and [employer].

## 2018-08-29 ENCOUNTER — Telehealth: Payer: Self-pay | Admitting: Internal Medicine

## 2018-08-29 NOTE — Telephone Encounter (Signed)
Scheduled appt per 5/07 los - pt to get an updated schedule next visit. appt already scheduled just added additional appts

## 2018-09-18 ENCOUNTER — Encounter: Payer: Self-pay | Admitting: Internal Medicine

## 2018-09-18 ENCOUNTER — Other Ambulatory Visit: Payer: Self-pay

## 2018-09-18 ENCOUNTER — Inpatient Hospital Stay (HOSPITAL_BASED_OUTPATIENT_CLINIC_OR_DEPARTMENT_OTHER): Payer: No Typology Code available for payment source | Admitting: Internal Medicine

## 2018-09-18 ENCOUNTER — Inpatient Hospital Stay: Payer: No Typology Code available for payment source

## 2018-09-18 VITALS — BP 148/83 | HR 98 | Temp 98.3°F | Resp 20 | Ht 75.0 in | Wt 158.9 lb

## 2018-09-18 DIAGNOSIS — C3432 Malignant neoplasm of lower lobe, left bronchus or lung: Secondary | ICD-10-CM

## 2018-09-18 DIAGNOSIS — C7951 Secondary malignant neoplasm of bone: Secondary | ICD-10-CM | POA: Diagnosis not present

## 2018-09-18 DIAGNOSIS — C3490 Malignant neoplasm of unspecified part of unspecified bronchus or lung: Secondary | ICD-10-CM

## 2018-09-18 DIAGNOSIS — Z79899 Other long term (current) drug therapy: Secondary | ICD-10-CM | POA: Diagnosis not present

## 2018-09-18 DIAGNOSIS — I10 Essential (primary) hypertension: Secondary | ICD-10-CM

## 2018-09-18 DIAGNOSIS — Z5112 Encounter for antineoplastic immunotherapy: Secondary | ICD-10-CM

## 2018-09-18 DIAGNOSIS — C3492 Malignant neoplasm of unspecified part of left bronchus or lung: Secondary | ICD-10-CM

## 2018-09-18 DIAGNOSIS — Z5111 Encounter for antineoplastic chemotherapy: Secondary | ICD-10-CM

## 2018-09-18 LAB — CBC WITH DIFFERENTIAL (CANCER CENTER ONLY)
Abs Immature Granulocytes: 0.03 10*3/uL (ref 0.00–0.07)
Basophils Absolute: 0.1 10*3/uL (ref 0.0–0.1)
Basophils Relative: 1 %
Eosinophils Absolute: 0.2 10*3/uL (ref 0.0–0.5)
Eosinophils Relative: 2 %
HCT: 39.8 % (ref 39.0–52.0)
Hemoglobin: 13.1 g/dL (ref 13.0–17.0)
Immature Granulocytes: 0 %
Lymphocytes Relative: 19 %
Lymphs Abs: 1.3 10*3/uL (ref 0.7–4.0)
MCH: 33.6 pg (ref 26.0–34.0)
MCHC: 32.9 g/dL (ref 30.0–36.0)
MCV: 102.1 fL — ABNORMAL HIGH (ref 80.0–100.0)
Monocytes Absolute: 1.3 10*3/uL — ABNORMAL HIGH (ref 0.1–1.0)
Monocytes Relative: 19 %
Neutro Abs: 4.1 10*3/uL (ref 1.7–7.7)
Neutrophils Relative %: 59 %
Platelet Count: 369 10*3/uL (ref 150–400)
RBC: 3.9 MIL/uL — ABNORMAL LOW (ref 4.22–5.81)
RDW: 14.4 % (ref 11.5–15.5)
WBC Count: 6.9 10*3/uL (ref 4.0–10.5)
nRBC: 0 % (ref 0.0–0.2)

## 2018-09-18 LAB — CMP (CANCER CENTER ONLY)
ALT: 20 U/L (ref 0–44)
AST: 38 U/L (ref 15–41)
Albumin: 3.5 g/dL (ref 3.5–5.0)
Alkaline Phosphatase: 91 U/L (ref 38–126)
Anion gap: 8 (ref 5–15)
BUN: 9 mg/dL (ref 8–23)
CO2: 19 mmol/L — ABNORMAL LOW (ref 22–32)
Calcium: 8.9 mg/dL (ref 8.9–10.3)
Chloride: 106 mmol/L (ref 98–111)
Creatinine: 1.24 mg/dL (ref 0.61–1.24)
GFR, Est AFR Am: >60 mL/min (ref >60)
GFR, Estimated: >60 mL/min (ref >60)
Glucose, Bld: 85 mg/dL (ref 70–99)
Potassium: 4 mmol/L (ref 3.5–5.1)
Sodium: 133 mmol/L — ABNORMAL LOW (ref 135–145)
Total Bilirubin: 0.3 mg/dL (ref 0.3–1.2)
Total Protein: 7.5 g/dL (ref 6.5–8.1)

## 2018-09-18 MED ORDER — DEXAMETHASONE SODIUM PHOSPHATE 10 MG/ML IJ SOLN
10.0000 mg | Freq: Once | INTRAMUSCULAR | Status: AC
  Administered 2018-09-18: 10 mg via INTRAVENOUS

## 2018-09-18 MED ORDER — SODIUM CHLORIDE 0.9 % IV SOLN
200.0000 mg | Freq: Once | INTRAVENOUS | Status: AC
  Administered 2018-09-18: 200 mg via INTRAVENOUS
  Filled 2018-09-18: qty 8

## 2018-09-18 MED ORDER — ONDANSETRON HCL 4 MG/2ML IJ SOLN
8.0000 mg | Freq: Once | INTRAMUSCULAR | Status: AC
  Administered 2018-09-18: 14:00:00 8 mg via INTRAVENOUS

## 2018-09-18 MED ORDER — SODIUM CHLORIDE 0.9 % IV SOLN
1000.0000 mg | Freq: Once | INTRAVENOUS | Status: AC
  Administered 2018-09-18: 15:00:00 1000 mg via INTRAVENOUS
  Filled 2018-09-18: qty 40

## 2018-09-18 MED ORDER — SODIUM CHLORIDE 0.9 % IV SOLN
Freq: Once | INTRAVENOUS | Status: AC
  Administered 2018-09-18: 13:00:00 via INTRAVENOUS
  Filled 2018-09-18: qty 250

## 2018-09-18 MED ORDER — DEXAMETHASONE SODIUM PHOSPHATE 10 MG/ML IJ SOLN
INTRAMUSCULAR | Status: AC
  Filled 2018-09-18: qty 1

## 2018-09-18 MED ORDER — ONDANSETRON HCL 4 MG/2ML IJ SOLN
INTRAMUSCULAR | Status: AC
  Filled 2018-09-18: qty 4

## 2018-09-18 NOTE — Patient Instructions (Signed)
[facility] Discharge Instructions for Patients Receiving Chemotherapy  Today you received the following chemotherapy agents :  Alimta & Keytruda  To help prevent nausea and vomiting after your treatment, we encourage you to take your nausea medication as prescribed.   If you develop nausea and vomiting that is not controlled by your nausea medication, call the clinic.   BELOW ARE SYMPTOMS THAT SHOULD BE REPORTED IMMEDIATELY:  *FEVER GREATER THAN 100.5 F  *CHILLS WITH OR WITHOUT FEVER  NAUSEA AND VOMITING THAT IS NOT CONTROLLED WITH YOUR NAUSEA MEDICATION  *UNUSUAL SHORTNESS OF BREATH  *UNUSUAL BRUISING OR BLEEDING  TENDERNESS IN MOUTH AND THROAT WITH OR WITHOUT PRESENCE OF ULCERS  *URINARY PROBLEMS  *BOWEL PROBLEMS  UNUSUAL RASH Items with * indicate a potential emergency and should be followed up as soon as possible.  Feel free to call the clinic should you have any questions or concerns. The clinic phone number is [phone].  Please show the [facility] at check-in to the Emergency Department and triage nurse. This information is directly available on the CDC website: RunningShows.co.za.html    Source:CDC Reference to specific commercial products, manufacturers, companies, or trademarks does not constitute its endorsement or recommendation by the [place], [place], or Centers for [employer] and [employer].

## 2018-09-18 NOTE — Progress Notes (Signed)
[name] Telephone:(336) [phone]   Fax:(336) [phone]  OFFICE PROGRESS NOTE  Patient, No Pcp Per No address on file  DIAGNOSIS: Metastatic non-small cell lung cancer, adenocarcinoma initially diagnosed as stage IB (T2a, N0, M0) non-small cell lung cancer, adenocarcinoma diagnosed in December 2017.  The patient has evidence for disease recurrence in July 2019.  Biomarker Findings Tumor Mutational Burden - TMB-Intermediate (16 Muts/Mb) Microsatellite status - MS-Stable Genomic Findings For a complete list of the genes assayed, please refer to the Appendix. KIT amplification KRAS U23N SMAD4 splice site [id number]>X VQ00 I232F 7 Disease relevant genes with no reportable alterations: EGFR, ALK, BRAF, MET, RET, ERBB2, ROS1   PDL 1 expression is 0%  PRIOR THERAPY:  1) status post left lower lobectomy as well as wedge resection of the left upper lobe on 05/11/2016. 2) Adjuvant systemic chemotherapy with cisplatin 75 MG/M2 and Alimta 500 MG/M2 every 3 weeks. First dose 07/03/2016. Status post 4 cycles.  CURRENT THERAPY: Systemic chemotherapy with carboplatin for AUC of 5, Alimta 500 mg/M2 and Keytruda 200 mg IV every 3 weeks.  Status post 13 cycles.  Starting from cycle #5 the patient will be treated with maintenance Alimta and Ketruda (pembrolizumab) every 3 weeks.  INTERVAL HISTORY: William Kales [name] 63 y.o. male returns to the clinic today for follow-up visit.  The patient is feeling fine today with no concerning complaints.  He denied having any chest pain, shortness of breath, cough or hemoptysis.  He denied having any fever or chills.  He has no nausea, vomiting, diarrhea or constipation.  The patient denied having any significant weight loss or night sweats.  He has been tolerating his treatment with maintenance Alimta and Keytruda fairly well.  He is here for evaluation before starting cycle #14.   MEDICAL HISTORY: Past Medical History:  Diagnosis Date  .  AAA (abdominal aortic aneurysm) ([place])   . AAA (abdominal aortic aneurysm) without rupture ([place]) 02/04/2014  . Cancer ([place])    lung  . Encounter for antineoplastic chemotherapy 06/06/2016  . History of hepatitis B   . HNP (herniated nucleus pulposus), lumbar    L4 with radiculopathy  . Hypertension   . Lung cancer ([place]) 05/18/2016  . Malignant neoplasm of lower lobe of left lung ([place]) 04/05/2016  . Mass of left lung   . Mitral insufficiency 04/06/2016   This patient will eventually need MV repair if his prognosis is good from oncology standpoint. However, his lung cancer therapy  is the priority right now. His cardiac condition won't preclude possible lung surgery.  Once his lung cancer is under control he will need a TEE to further evaluate his mitral valve anatomy and MR severity, and also have ischemic workup as tere is evidence on calcificati  . Renal insufficiency 10/09/2016  . S/P partial lobectomy of lung 05/11/2016  . Varicose veins of legs     ALLERGIES:  is allergic to tramadol.  MEDICATIONS:  Current Outpatient Medications  Medication Sig Dispense Refill  . folic acid (FOLVITE) 1 MG tablet TAKE 1 TABLET BY MOUTH EVERY DAY 90 tablet 1  . lisinopril (PRINIVIL,ZESTRIL) 2.5 MG tablet Take 1 tablet (2.5 mg total) by mouth daily. 30 tablet 11   No current facility-administered medications for this visit.     SURGICAL HISTORY:  Past Surgical History:  Procedure Laterality Date  . ABDOMINAL AORTIC ENDOVASCULAR STENT GRAFT N/A 03/12/2016   Procedure: ABDOMINAL AORTIC ENDOVASCULAR STENT GRAFT;  Surgeon: Aaron Edelman  Starlyn Skeans, MD;  Location: [facility];  Service: Vascular;  Laterality: N/A;  . COLONOSCOPY    . INGUINAL HERNIA REPAIR Left 1975   Left inguinal hernia  . INGUINAL HERNIA REPAIR Right   . LOBECTOMY Left 05/11/2016   Procedure: LEFT LOWER LOBE LOBECTOMY AND LEFT UPPER LOBE  RESECTION AND PLACEMENT OF ON-Q;  Surgeon: [name], MD;  Location: [facility];  Service: Thoracic;   Laterality: Left;  . LUMBAR LAMINECTOMY  October 22, 2012  . LYMPH NODE DISSECTION Left 05/11/2016   Procedure: LYMPH NODE DISSECTION;  Surgeon: [name], MD;  Location: [facility];  Service: Thoracic;  Laterality: Left;  . STAPLING OF BLEBS Left 05/11/2016   Procedure: STAPLING OF APICAL BLEB;  Surgeon: [name], MD;  Location: [facility];  Service: Thoracic;  Laterality: Left;  Marland Kitchen VIDEO ASSISTED THORACOSCOPY Left 05/18/2016   Procedure: VIDEO ASSISTED THORACOSCOPY WITH REMOVAL OF LEFT APICAL BLEB;  Surgeon: [name], MD;  Location: [facility];  Service: Thoracic;  Laterality: Left;  Marland Kitchen VIDEO ASSISTED THORACOSCOPY (VATS)/WEDGE RESECTION Left 05/11/2016   Procedure: LEFT VIDEO ASSISTED THORACOSCOPY (VATS);  Surgeon: [name], MD;  Location: [facility];  Service: Thoracic;  Laterality: Left;  Marland Kitchen VIDEO BRONCHOSCOPY N/A 05/11/2016   Procedure: VIDEO BRONCHOSCOPY, LEFT LUNG;  Surgeon: [name], MD;  Location: [facility];  Service: Thoracic;  Laterality: N/A;  . VIDEO BRONCHOSCOPY N/A 05/18/2016   Procedure: VIDEO BRONCHOSCOPY WITH BRONCHIAL WASHING;  Surgeon: [name], MD;  Location: [facility];  Service: Thoracic;  Laterality: N/A;    REVIEW OF SYSTEMS:  A comprehensive review of systems was negative except for: Constitutional: positive for fatigue   PHYSICAL EXAMINATION: General appearance: alert, cooperative and no distress Head: Normocephalic, without obvious abnormality, atraumatic Neck: no adenopathy, no JVD, supple, symmetrical, trachea midline and thyroid not enlarged, symmetric, no tenderness/mass/nodules Lymph nodes: Cervical, supraclavicular, and axillary nodes normal. Resp: clear to auscultation bilaterally Back: symmetric, no curvature. ROM normal. No CVA tenderness. Cardio: regular rate and rhythm, S1, S2 normal, no murmur, click, rub or gallop GI: soft, non-tender; bowel sounds normal; no masses,  no organomegaly Extremities: extremities normal, atraumatic, no cyanosis  or edema   ECOG PERFORMANCE STATUS: 1 - Symptomatic but completely ambulatory  Blood pressure (!) 148/83, pulse 98, temperature 98.3 F (36.8 C), temperature source Oral, resp. rate 20, height _0  (1.905 m), weight 158 lb 14.4 oz (72.1 kg), SpO2 97 %.  LABORATORY DATA: Lab Results  Component Value Date   WBC 6.9 09/18/2018   HGB 13.1 09/18/2018   HCT 39.8 09/18/2018   MCV 102.1 (H) 09/18/2018   PLT 369 09/18/2018      Chemistry      Component Value Date/Time   NA 139 08/28/2018 1042   NA 138 01/11/2017 1343   K 4.2 08/28/2018 1042   K 4.7 01/11/2017 1343   CL 110 08/28/2018 1042   CO2 19 (L) 08/28/2018 1042   CO2 23 01/11/2017 1343   BUN 12 08/28/2018 1042   BUN 20.2 01/11/2017 1343   CREATININE 1.23 08/28/2018 1042   CREATININE 1.6 (H) 01/11/2017 1343      Component Value Date/Time   CALCIUM 8.8 (L) 08/28/2018 1042   CALCIUM 9.3 01/11/2017 1343   ALKPHOS 90 08/28/2018 1042   ALKPHOS 89 01/11/2017 1343   AST 39 08/28/2018 1042   AST 41 (H) 01/11/2017 1343   ALT 24 08/28/2018 1042   ALT 27 01/11/2017 1343  BILITOT 0.5 08/28/2018 1042   BILITOT 0.49 01/11/2017 1343       RADIOGRAPHIC STUDIES: No results found.  ASSESSMENT AND PLAN:  This is a very pleasant 63 years old white male with a stage IB non-small cell lung cancer, adenocarcinoma status post wedge resection of the left upper lobe followed by 4 cycles of adjuvant systemic chemotherapy with cisplatin and Alimta and he tolerated his treatment well except for fatigue. The patient has been on observation since June 2018.   The recent imaging studies including CT scan of the chest as well as a PET scan showed evidence for disease recurrence with metastatic disease presented with bilateral pulmonary nodules as well as destructive bone lesions at T4 vertebral body, biopsy proven to be metastatic adenocarcinoma. Molecular studies by foundation 1 showed no actionable mutations.  PDL 1 expression was negative. The  patient is currently undergoing treatment with carboplatin, Alimta and Ketruda (pembrolizumab) status post 13 cycles. Starting from cycle #5 the patient will be treated with maintenance Alimta and Ketruda (pembrolizumab) every 3 weeks. He continues to tolerate this treatment well with no concerning adverse effects. I recommended for him to proceed with cycle #14 today as planned. He will come back for follow-up visit in 3 weeks for evaluation before starting cycle #15 with repeat CT scan of the chest, abdomen and pelvis for restaging of his disease. The patient was advised to call if he has any concerning symptoms in the interval. The patient voices understanding of current disease status and treatment options and is in agreement with the current care plan. All questions were answered. The patient knows to call the clinic with any problems, questions or concerns. We can certainly see the patient much sooner if necessary.  Disclaimer: This note was dictated with voice recognition software. Similar sounding words can inadvertently be transcribed and may not be corrected upon review.

## 2018-10-01 ENCOUNTER — Telehealth: Payer: Self-pay | Admitting: Medical Oncology

## 2018-10-01 NOTE — Telephone Encounter (Signed)
instructions given to pick up contrast.

## 2018-10-07 ENCOUNTER — Ambulatory Visit (HOSPITAL_COMMUNITY): Admission: RE | Admit: 2018-10-07 | Payer: No Typology Code available for payment source | Source: Ambulatory Visit

## 2018-10-08 ENCOUNTER — Other Ambulatory Visit: Payer: Self-pay | Admitting: Internal Medicine

## 2018-10-08 DIAGNOSIS — C3490 Malignant neoplasm of unspecified part of unspecified bronchus or lung: Secondary | ICD-10-CM

## 2018-10-09 ENCOUNTER — Inpatient Hospital Stay: Payer: No Typology Code available for payment source

## 2018-10-09 ENCOUNTER — Inpatient Hospital Stay (HOSPITAL_BASED_OUTPATIENT_CLINIC_OR_DEPARTMENT_OTHER): Payer: No Typology Code available for payment source | Admitting: Internal Medicine

## 2018-10-09 ENCOUNTER — Encounter: Payer: Self-pay | Admitting: Internal Medicine

## 2018-10-09 ENCOUNTER — Inpatient Hospital Stay: Payer: No Typology Code available for payment source | Attending: Internal Medicine

## 2018-10-09 ENCOUNTER — Other Ambulatory Visit: Payer: Self-pay

## 2018-10-09 VITALS — BP 131/77 | HR 85

## 2018-10-09 VITALS — BP 164/90 | HR 93 | Temp 98.1°F | Resp 20 | Ht 75.0 in | Wt 158.2 lb

## 2018-10-09 DIAGNOSIS — C7951 Secondary malignant neoplasm of bone: Secondary | ICD-10-CM | POA: Diagnosis not present

## 2018-10-09 DIAGNOSIS — I10 Essential (primary) hypertension: Secondary | ICD-10-CM | POA: Insufficient documentation

## 2018-10-09 DIAGNOSIS — Z79899 Other long term (current) drug therapy: Secondary | ICD-10-CM | POA: Insufficient documentation

## 2018-10-09 DIAGNOSIS — C3432 Malignant neoplasm of lower lobe, left bronchus or lung: Secondary | ICD-10-CM | POA: Insufficient documentation

## 2018-10-09 DIAGNOSIS — C3492 Malignant neoplasm of unspecified part of left bronchus or lung: Secondary | ICD-10-CM

## 2018-10-09 DIAGNOSIS — Z5111 Encounter for antineoplastic chemotherapy: Secondary | ICD-10-CM

## 2018-10-09 DIAGNOSIS — Z5112 Encounter for antineoplastic immunotherapy: Secondary | ICD-10-CM | POA: Diagnosis present

## 2018-10-09 LAB — CBC WITH DIFFERENTIAL (CANCER CENTER ONLY)
Abs Immature Granulocytes: 0.02 10*3/uL (ref 0.00–0.07)
Basophils Absolute: 0 10*3/uL (ref 0.0–0.1)
Basophils Relative: 0 %
Eosinophils Absolute: 0.1 10*3/uL (ref 0.0–0.5)
Eosinophils Relative: 2 %
HCT: 40.8 % (ref 39.0–52.0)
Hemoglobin: 13.2 g/dL (ref 13.0–17.0)
Immature Granulocytes: 0 %
Lymphocytes Relative: 16 %
Lymphs Abs: 1.1 10*3/uL (ref 0.7–4.0)
MCH: 33.3 pg (ref 26.0–34.0)
MCHC: 32.4 g/dL (ref 30.0–36.0)
MCV: 103 fL — ABNORMAL HIGH (ref 80.0–100.0)
Monocytes Absolute: 1.4 10*3/uL — ABNORMAL HIGH (ref 0.1–1.0)
Monocytes Relative: 19 %
Neutro Abs: 4.5 10*3/uL (ref 1.7–7.7)
Neutrophils Relative %: 63 %
Platelet Count: 426 10*3/uL — ABNORMAL HIGH (ref 150–400)
RBC: 3.96 MIL/uL — ABNORMAL LOW (ref 4.22–5.81)
RDW: 14.6 % (ref 11.5–15.5)
WBC Count: 7.1 10*3/uL (ref 4.0–10.5)
nRBC: 0 % (ref 0.0–0.2)

## 2018-10-09 LAB — CMP (CANCER CENTER ONLY)
ALT: 26 U/L (ref 0–44)
AST: 42 U/L — ABNORMAL HIGH (ref 15–41)
Albumin: 3.7 g/dL (ref 3.5–5.0)
Alkaline Phosphatase: 91 U/L (ref 38–126)
Anion gap: 8 (ref 5–15)
BUN: 9 mg/dL (ref 8–23)
CO2: 22 mmol/L (ref 22–32)
Calcium: 8.9 mg/dL (ref 8.9–10.3)
Chloride: 106 mmol/L (ref 98–111)
Creatinine: 1.31 mg/dL — ABNORMAL HIGH (ref 0.61–1.24)
GFR, Est AFR Am: >60 mL/min (ref >60)
GFR, Estimated: 57 mL/min — ABNORMAL LOW (ref >60)
Glucose, Bld: 89 mg/dL (ref 70–99)
Potassium: 3.8 mmol/L (ref 3.5–5.1)
Sodium: 136 mmol/L (ref 135–145)
Total Bilirubin: 0.4 mg/dL (ref 0.3–1.2)
Total Protein: 7.3 g/dL (ref 6.5–8.1)

## 2018-10-09 LAB — TSH: TSH: 2.275 u[IU]/mL (ref 0.320–4.118)

## 2018-10-09 MED ORDER — CYANOCOBALAMIN 1000 MCG/ML IJ SOLN
1000.0000 ug | Freq: Once | INTRAMUSCULAR | Status: AC
  Administered 2018-10-09: 1000 ug via INTRAMUSCULAR

## 2018-10-09 MED ORDER — DEXAMETHASONE SODIUM PHOSPHATE 10 MG/ML IJ SOLN
INTRAMUSCULAR | Status: AC
  Filled 2018-10-09: qty 1

## 2018-10-09 MED ORDER — SODIUM CHLORIDE 0.9 % IV SOLN
520.0000 mg/m2 | Freq: Once | INTRAVENOUS | Status: AC
  Administered 2018-10-09: 1000 mg via INTRAVENOUS
  Filled 2018-10-09: qty 40

## 2018-10-09 MED ORDER — ONDANSETRON HCL 4 MG/2ML IJ SOLN
8.0000 mg | Freq: Once | INTRAMUSCULAR | Status: AC
  Administered 2018-10-09: 8 mg via INTRAVENOUS

## 2018-10-09 MED ORDER — ONDANSETRON HCL 4 MG/2ML IJ SOLN
INTRAMUSCULAR | Status: AC
  Filled 2018-10-09: qty 4

## 2018-10-09 MED ORDER — SODIUM CHLORIDE 0.9 % IV SOLN
Freq: Once | INTRAVENOUS | Status: AC
  Administered 2018-10-09: 13:00:00 via INTRAVENOUS
  Filled 2018-10-09: qty 250

## 2018-10-09 MED ORDER — CYANOCOBALAMIN 1000 MCG/ML IJ SOLN
INTRAMUSCULAR | Status: AC
  Filled 2018-10-09: qty 1

## 2018-10-09 MED ORDER — SODIUM CHLORIDE 0.9 % IV SOLN
200.0000 mg | Freq: Once | INTRAVENOUS | Status: AC
  Administered 2018-10-09: 14:00:00 200 mg via INTRAVENOUS
  Filled 2018-10-09: qty 8

## 2018-10-09 MED ORDER — DEXAMETHASONE SODIUM PHOSPHATE 10 MG/ML IJ SOLN
10.0000 mg | Freq: Once | INTRAMUSCULAR | Status: AC
  Administered 2018-10-09: 13:00:00 10 mg via INTRAVENOUS

## 2018-10-09 NOTE — Progress Notes (Signed)
[facility] Telephone:(336) [phone]   Fax:(336) [phone]  OFFICE PROGRESS NOTE  Patient, No Pcp Per No address on file  DIAGNOSIS: Metastatic non-small cell lung cancer, adenocarcinoma initially diagnosed as stage IB (T2a, N0, M0) non-small cell lung cancer, adenocarcinoma diagnosed in December 2017.  The patient has evidence for disease recurrence in July 2019.  Biomarker Findings Tumor Mutational Burden - TMB-Intermediate (16 Muts/Mb) Microsatellite status - MS-Stable Genomic Findings For a complete list of the genes assayed, please refer to the Appendix. KIT amplification KRAS H88F SMAD4 splice site [id number]>O IN86 I232F 7 Disease relevant genes with no reportable alterations: EGFR, ALK, BRAF, MET, RET, ERBB2, ROS1   PDL 1 expression is 0%  PRIOR THERAPY:  1) status post left lower lobectomy as well as wedge resection of the left upper lobe on 05/11/2016. 2) Adjuvant systemic chemotherapy with cisplatin 75 MG/M2 and Alimta 500 MG/M2 every 3 weeks. First dose 07/03/2016. Status post 4 cycles.  CURRENT THERAPY: Systemic chemotherapy with carboplatin for AUC of 5, Alimta 500 mg/M2 and Keytruda 200 mg IV every 3 weeks.  Status post 14 cycles.  Starting from cycle #5 the patient will be treated with maintenance Alimta and Ketruda (pembrolizumab) every 3 weeks.  INTERVAL HISTORY: William Kales [name] 63 y.o. male returns to the clinic today for follow-up visit.  The patient is feeling fine today with no concerning complaints except for mild fatigue.  He denied having any chest pain, shortness of breath, cough or hemoptysis.  The patient denied having any fever or chills.  He has no nausea, vomiting, diarrhea or constipation.  He denied having any headache or visual changes.  The patient continues to tolerate his maintenance treatment fairly well.  He was supposed to have repeat CT scan of the chest, abdomen and pelvis before this visit but unfortunately because of  insurance issues this scan was delayed.  The patient is here today for evaluation before starting cycle #15.    MEDICAL HISTORY: Past Medical History:  Diagnosis Date   AAA (abdominal aortic aneurysm) ([facility])    AAA (abdominal aortic aneurysm) without rupture ([place]) 02/04/2014   Cancer ([place])    lung   Encounter for antineoplastic chemotherapy 06/06/2016   History of hepatitis B    HNP (herniated nucleus pulposus), lumbar    L4 with radiculopathy   Hypertension    Lung cancer ([place]) 05/18/2016   Malignant neoplasm of lower lobe of left lung ([place]) 04/05/2016   Mass of left lung    Mitral insufficiency 04/06/2016   This patient will eventually need MV repair if his prognosis is good from oncology standpoint. However, his lung cancer therapy  is the priority right now. His cardiac condition won't preclude possible lung surgery.  Once his lung cancer is under control he will need a TEE to further evaluate his mitral valve anatomy and MR severity, and also have ischemic workup as tere is evidence on calcificati   Renal insufficiency 10/09/2016   S/P partial lobectomy of lung 05/11/2016   Varicose veins of legs     ALLERGIES:  is allergic to tramadol.  MEDICATIONS:  Current Outpatient Medications  Medication Sig Dispense Refill   folic acid (FOLVITE) 1 MG tablet TAKE 1 TABLET BY MOUTH EVERY DAY 90 tablet 1   lisinopril (PRINIVIL,ZESTRIL) 2.5 MG tablet Take 1 tablet (2.5 mg total) by mouth daily. 30 tablet 11   No current facility-administered medications for this visit.  SURGICAL HISTORY:  Past Surgical History:  Procedure Laterality Date   ABDOMINAL AORTIC ENDOVASCULAR STENT GRAFT N/A 03/12/2016   Procedure: ABDOMINAL AORTIC ENDOVASCULAR STENT GRAFT;  Surgeon: Conrad , MD;  Location: [facility];  Service: Vascular;  Laterality: N/A;   COLONOSCOPY     INGUINAL HERNIA REPAIR Left 1975   Left inguinal hernia   INGUINAL HERNIA REPAIR Right    LOBECTOMY Left  05/11/2016   Procedure: LEFT LOWER LOBE LOBECTOMY AND LEFT UPPER LOBE  RESECTION AND PLACEMENT OF ON-Q;  Surgeon: [name], MD;  Location: [facility];  Service: Thoracic;  Laterality: Left;   LUMBAR LAMINECTOMY  October 22, 2012   LYMPH NODE DISSECTION Left 05/11/2016   Procedure: LYMPH NODE DISSECTION;  Surgeon: [name], MD;  Location: [facility];  Service: Thoracic;  Laterality: Left;   STAPLING OF BLEBS Left 05/11/2016   Procedure: STAPLING OF APICAL BLEB;  Surgeon: [name], MD;  [facility];  Service: Thoracic;  Laterality: Left;   VIDEO ASSISTED THORACOSCOPY Left 05/18/2016   Procedure: VIDEO ASSISTED THORACOSCOPY WITH REMOVAL OF LEFT APICAL BLEB;  Surgeon: [name], MD;  Location: [facility];  Service: Thoracic;  Laterality: Left;   VIDEO ASSISTED THORACOSCOPY (VATS)/WEDGE RESECTION Left 05/11/2016   Procedure: LEFT VIDEO ASSISTED THORACOSCOPY (VATS);  Surgeon: [name], MD;  Location: [facility];  Service: Thoracic;  Laterality: Left;   VIDEO BRONCHOSCOPY N/A 05/11/2016   Procedure: VIDEO BRONCHOSCOPY, LEFT LUNG;  Surgeon: [name], MD;  Location: [facility];  Service: Thoracic;  Laterality: N/A;   VIDEO BRONCHOSCOPY N/A 05/18/2016   Procedure: VIDEO BRONCHOSCOPY WITH BRONCHIAL WASHING;  Surgeon: [name], MD;  Location: [facility];  Service: Thoracic;  Laterality: N/A;    REVIEW OF SYSTEMS:  A comprehensive review of systems was negative except for: Constitutional: positive for fatigue   PHYSICAL EXAMINATION: General appearance: alert, cooperative and no distress Head: Normocephalic, without obvious abnormality, atraumatic Neck: no adenopathy, no JVD, supple, symmetrical, trachea midline and thyroid not enlarged, symmetric, no tenderness/mass/nodules Lymph nodes: Cervical, supraclavicular, and axillary nodes normal. Resp: clear to auscultation bilaterally Back: symmetric, no curvature. ROM normal. No CVA tenderness. Cardio: regular rate and rhythm,  S1, S2 normal, no murmur, click, rub or gallop GI: soft, non-tender; bowel sounds normal; no masses,  no organomegaly Extremities: extremities normal, atraumatic, no cyanosis or edema   ECOG PERFORMANCE STATUS: 1 - Symptomatic but completely ambulatory  Blood pressure (!) 164/90, pulse 93, temperature 98.1 F (36.7 C), temperature source Oral, resp. rate 20, height _0  (1.905 m), weight 158 lb 3.2 oz (71.8 kg), SpO2 99 %.  LABORATORY DATA: Lab Results  Component Value Date   WBC 7.1 10/09/2018   HGB 13.2 10/09/2018   HCT 40.8 10/09/2018   MCV 103.0 (H) 10/09/2018   PLT 426 (H) 10/09/2018      Chemistry      Component Value Date/Time   NA 136 10/09/2018 1115   NA 138 01/11/2017 1343   K 3.8 10/09/2018 1115   K 4.7 01/11/2017 1343   CL 106 10/09/2018 1115   CO2 22 10/09/2018 1115   CO2 23 01/11/2017 1343   BUN 9 10/09/2018 1115   BUN 20.2 01/11/2017 1343   CREATININE 1.31 (H) 10/09/2018 1115   CREATININE 1.6 (H) 01/11/2017 1343      Component Value Date/Time   CALCIUM 8.9 10/09/2018 1115   CALCIUM 9.3 01/11/2017 1343   ALKPHOS 91 10/09/2018 1115   ALKPHOS  89 01/11/2017 1343   AST 42 (H) 10/09/2018 1115   AST 41 (H) 01/11/2017 1343   ALT 26 10/09/2018 1115   ALT 27 01/11/2017 1343   BILITOT 0.4 10/09/2018 1115   BILITOT 0.49 01/11/2017 1343       RADIOGRAPHIC STUDIES: No results found.  ASSESSMENT AND PLAN:  This is a very pleasant 63 years old white male with a stage IB non-small cell lung cancer, adenocarcinoma status post wedge resection of the left upper lobe followed by 4 cycles of adjuvant systemic chemotherapy with cisplatin and Alimta and he tolerated his treatment well except for fatigue. The patient has been on observation since June 2018.   The recent imaging studies including CT scan of the chest as well as a PET scan showed evidence for disease recurrence with metastatic disease presented with bilateral pulmonary nodules as well as destructive bone  lesions at T4 vertebral body, biopsy proven to be metastatic adenocarcinoma. Molecular studies by foundation 1 showed no actionable mutations.  PDL 1 expression was negative. The patient is currently undergoing treatment with carboplatin, Alimta and Ketruda (pembrolizumab) status post 14 cycles. Starting from cycle #5 the patient will be treated with maintenance Alimta and Ketruda (pembrolizumab) every 3 weeks. He tolerated the last cycle of his treatment well with no concerning adverse effects. I recommended for him to proceed with cycle #15 today as planned. For hypertension I strongly advised the patient to take his blood pressure medication as prescribed and to monitor it closely at home. He will come back for follow-up visit in 3 weeks for evaluation and we will try to get his a scan rescheduled to be done before the next visit. The patient was advised to call immediately if he has any concerning symptoms in the interval. The patient voices understanding of current disease status and treatment options and is in agreement with the current care plan. All questions were answered. The patient knows to call the clinic with any problems, questions or concerns. We can certainly see the patient much sooner if necessary.  Disclaimer: This note was dictated with voice recognition software. Similar sounding words can inadvertently be transcribed and may not be corrected upon review.

## 2018-10-09 NOTE — Patient Instructions (Signed)
[facility] Discharge Instructions for Patients Receiving Chemotherapy  Today you received the following chemotherapy agents Keytruda and Alimta  To help prevent nausea and vomiting after your treatment, we encourage you to take your nausea medication as directed.   If you develop nausea and vomiting that is not controlled by your nausea medication, call the clinic.   BELOW ARE SYMPTOMS THAT SHOULD BE REPORTED IMMEDIATELY:  *FEVER GREATER THAN 100.5 F  *CHILLS WITH OR WITHOUT FEVER  NAUSEA AND VOMITING THAT IS NOT CONTROLLED WITH YOUR NAUSEA MEDICATION  *UNUSUAL SHORTNESS OF BREATH  *UNUSUAL BRUISING OR BLEEDING  TENDERNESS IN MOUTH AND THROAT WITH OR WITHOUT PRESENCE OF ULCERS  *URINARY PROBLEMS  *BOWEL PROBLEMS  UNUSUAL RASH Items with * indicate a potential emergency and should be followed up as soon as possible.  Feel free to call the clinic you have any questions or concerns. The clinic phone number is [phone].  Please show the [facility] at check-in to the Emergency Department and triage nurse.

## 2018-10-27 ENCOUNTER — Encounter (HOSPITAL_COMMUNITY): Payer: Self-pay

## 2018-10-27 ENCOUNTER — Ambulatory Visit (HOSPITAL_COMMUNITY)
Admission: RE | Admit: 2018-10-27 | Discharge: 2018-10-27 | Disposition: A | Discharge status: Home / Self Care | Payer: No Typology Code available for payment source | Source: Ambulatory Visit | Attending: Internal Medicine | Admitting: Internal Medicine

## 2018-10-27 ENCOUNTER — Other Ambulatory Visit: Payer: Self-pay

## 2018-10-27 DIAGNOSIS — C3490 Malignant neoplasm of unspecified part of unspecified bronchus or lung: Secondary | ICD-10-CM | POA: Insufficient documentation

## 2018-10-27 MED ORDER — IOHEXOL 300 MG/ML  SOLN
100.0000 mL | Freq: Once | INTRAMUSCULAR | Status: AC | PRN
  Administered 2018-10-27: 09:00:00 100 mL via INTRAVENOUS

## 2018-10-27 MED ORDER — SODIUM CHLORIDE (PF) 0.9 % IJ SOLN
INTRAMUSCULAR | Status: AC
  Filled 2018-10-27: qty 50

## 2018-10-30 ENCOUNTER — Encounter: Payer: Self-pay | Admitting: Internal Medicine

## 2018-10-30 ENCOUNTER — Inpatient Hospital Stay: Payer: No Typology Code available for payment source

## 2018-10-30 ENCOUNTER — Inpatient Hospital Stay: Payer: No Typology Code available for payment source | Attending: Internal Medicine

## 2018-10-30 ENCOUNTER — Other Ambulatory Visit: Payer: Self-pay

## 2018-10-30 ENCOUNTER — Inpatient Hospital Stay (HOSPITAL_BASED_OUTPATIENT_CLINIC_OR_DEPARTMENT_OTHER): Payer: No Typology Code available for payment source | Admitting: Internal Medicine

## 2018-10-30 VITALS — BP 154/88 | HR 78 | Temp 97.8°F | Resp 20 | Ht 75.0 in | Wt 159.4 lb

## 2018-10-30 VITALS — BP 159/93 | HR 75

## 2018-10-30 DIAGNOSIS — Z5112 Encounter for antineoplastic immunotherapy: Secondary | ICD-10-CM | POA: Diagnosis present

## 2018-10-30 DIAGNOSIS — Z5111 Encounter for antineoplastic chemotherapy: Secondary | ICD-10-CM | POA: Diagnosis present

## 2018-10-30 DIAGNOSIS — C3492 Malignant neoplasm of unspecified part of left bronchus or lung: Secondary | ICD-10-CM

## 2018-10-30 DIAGNOSIS — C7951 Secondary malignant neoplasm of bone: Secondary | ICD-10-CM | POA: Insufficient documentation

## 2018-10-30 DIAGNOSIS — I10 Essential (primary) hypertension: Secondary | ICD-10-CM | POA: Insufficient documentation

## 2018-10-30 DIAGNOSIS — C3432 Malignant neoplasm of lower lobe, left bronchus or lung: Secondary | ICD-10-CM | POA: Diagnosis present

## 2018-10-30 DIAGNOSIS — Z79899 Other long term (current) drug therapy: Secondary | ICD-10-CM | POA: Diagnosis not present

## 2018-10-30 LAB — CBC WITH DIFFERENTIAL (CANCER CENTER ONLY)
Abs Immature Granulocytes: 0.03 10*3/uL (ref 0.00–0.07)
Basophils Absolute: 0.1 10*3/uL (ref 0.0–0.1)
Basophils Relative: 1 %
Eosinophils Absolute: 0.2 10*3/uL (ref 0.0–0.5)
Eosinophils Relative: 2 %
HCT: 40.2 % (ref 39.0–52.0)
Hemoglobin: 13.2 g/dL (ref 13.0–17.0)
Immature Granulocytes: 0 %
Lymphocytes Relative: 19 %
Lymphs Abs: 1.3 10*3/uL (ref 0.7–4.0)
MCH: 33.6 pg (ref 26.0–34.0)
MCHC: 32.8 g/dL (ref 30.0–36.0)
MCV: 102.3 fL — ABNORMAL HIGH (ref 80.0–100.0)
Monocytes Absolute: 1.3 10*3/uL — ABNORMAL HIGH (ref 0.1–1.0)
Monocytes Relative: 18 %
Neutro Abs: 4.1 10*3/uL (ref 1.7–7.7)
Neutrophils Relative %: 60 %
Platelet Count: 446 10*3/uL — ABNORMAL HIGH (ref 150–400)
RBC: 3.93 MIL/uL — ABNORMAL LOW (ref 4.22–5.81)
RDW: 15.1 % (ref 11.5–15.5)
WBC Count: 6.9 10*3/uL (ref 4.0–10.5)
nRBC: 0 % (ref 0.0–0.2)

## 2018-10-30 LAB — CMP (CANCER CENTER ONLY)
ALT: 21 U/L (ref 0–44)
AST: 36 U/L (ref 15–41)
Albumin: 3.5 g/dL (ref 3.5–5.0)
Alkaline Phosphatase: 94 U/L (ref 38–126)
Anion gap: 9 (ref 5–15)
BUN: 8 mg/dL (ref 8–23)
CO2: 21 mmol/L — ABNORMAL LOW (ref 22–32)
Calcium: 8.6 mg/dL — ABNORMAL LOW (ref 8.9–10.3)
Chloride: 106 mmol/L (ref 98–111)
Creatinine: 1.28 mg/dL — ABNORMAL HIGH (ref 0.61–1.24)
GFR, Est AFR Am: >60 mL/min (ref >60)
GFR, Estimated: 58 mL/min — ABNORMAL LOW (ref >60)
Glucose, Bld: 84 mg/dL (ref 70–99)
Potassium: 4 mmol/L (ref 3.5–5.1)
Sodium: 136 mmol/L (ref 135–145)
Total Bilirubin: 0.3 mg/dL (ref 0.3–1.2)
Total Protein: 7.2 g/dL (ref 6.5–8.1)

## 2018-10-30 MED ORDER — ONDANSETRON HCL 4 MG/2ML IJ SOLN
8.0000 mg | Freq: Once | INTRAMUSCULAR | Status: AC
  Administered 2018-10-30: 8 mg via INTRAVENOUS

## 2018-10-30 MED ORDER — SODIUM CHLORIDE 0.9 % IV SOLN
520.0000 mg/m2 | Freq: Once | INTRAVENOUS | Status: AC
  Administered 2018-10-30: 1000 mg via INTRAVENOUS
  Filled 2018-10-30: qty 40

## 2018-10-30 MED ORDER — DEXAMETHASONE SODIUM PHOSPHATE 10 MG/ML IJ SOLN
INTRAMUSCULAR | Status: AC
  Filled 2018-10-30: qty 1

## 2018-10-30 MED ORDER — ONDANSETRON HCL 4 MG/2ML IJ SOLN
INTRAMUSCULAR | Status: AC
  Filled 2018-10-30: qty 4

## 2018-10-30 MED ORDER — SODIUM CHLORIDE 0.9 % IV SOLN
200.0000 mg | Freq: Once | INTRAVENOUS | Status: AC
  Administered 2018-10-30: 200 mg via INTRAVENOUS
  Filled 2018-10-30: qty 8

## 2018-10-30 MED ORDER — DEXAMETHASONE SODIUM PHOSPHATE 10 MG/ML IJ SOLN
10.0000 mg | Freq: Once | INTRAMUSCULAR | Status: AC
  Administered 2018-10-30: 10 mg via INTRAVENOUS

## 2018-10-30 MED ORDER — SODIUM CHLORIDE 0.9 % IV SOLN
Freq: Once | INTRAVENOUS | Status: AC
  Administered 2018-10-30: 11:00:00 via INTRAVENOUS
  Filled 2018-10-30: qty 250

## 2018-10-30 NOTE — Patient Instructions (Signed)
[facility] Discharge Instructions for Patients Receiving Chemotherapy  Today you received the following chemotherapy agents Keytruda and Alimta  To help prevent nausea and vomiting after your treatment, we encourage you to take your nausea medication as directed.   If you develop nausea and vomiting that is not controlled by your nausea medication, call the clinic.   BELOW ARE SYMPTOMS THAT SHOULD BE REPORTED IMMEDIATELY:  *FEVER GREATER THAN 100.5 F  *CHILLS WITH OR WITHOUT FEVER  NAUSEA AND VOMITING THAT IS NOT CONTROLLED WITH YOUR NAUSEA MEDICATION  *UNUSUAL SHORTNESS OF BREATH  *UNUSUAL BRUISING OR BLEEDING  TENDERNESS IN MOUTH AND THROAT WITH OR WITHOUT PRESENCE OF ULCERS  *URINARY PROBLEMS  *BOWEL PROBLEMS  UNUSUAL RASH Items with * indicate a potential emergency and should be followed up as soon as possible.  Feel free to call the clinic you have any questions or concerns. The clinic phone number is [phone].  Please show the [facility] at check-in to the Emergency Department and triage nurse.

## 2018-10-30 NOTE — Progress Notes (Signed)
[name] [phone]   [phone]  OFFICE PROGRESS NOTE  Patient, No Pcp Per No address on file  DIAGNOSIS: Metastatic non-small cell lung cancer, adenocarcinoma initially diagnosed as stage IB (T2a, N0, M0) non-small cell lung cancer, adenocarcinoma diagnosed in December 2017.  The patient has evidence for disease recurrence in July 2019.  Biomarker Findings Tumor Mutational Burden - TMB-Intermediate (16 Muts/Mb) Microsatellite status - MS-Stable Genomic Findings For a complete list of the genes assayed, please refer to the Appendix. KIT amplification KRAS O53G SMAD4 splice site 6440-3K>V QQ59 I232F 7 Disease relevant genes with no reportable alterations: EGFR, ALK, BRAF, MET, RET, ERBB2, ROS1   PDL 1 expression is 0%  PRIOR THERAPY:  1) status post left lower lobectomy as well as wedge resection of the left upper lobe on 05/11/2016. 2) Adjuvant systemic chemotherapy with cisplatin 75 MG/M2 and Alimta 500 MG/M2 every 3 weeks. First dose 07/03/2016. Status post 4 cycles.  CURRENT THERAPY: Systemic chemotherapy with carboplatin for AUC of 5, Alimta 500 mg/M2 and Keytruda 200 mg IV every 3 weeks.  Status post 15 cycles.  Starting from cycle #5 the patient will be treated with maintenance Alimta and Ketruda (pembrolizumab) every 3 weeks.  INTERVAL HISTORY: [name]. 63 y.o. male returns to the clinic today for follow-up visit.  The patient is feeling fine today with no concerning complaints.  He denied having any current chest pain, shortness of breath, cough or hemoptysis.  He denied having any fever or chills.  He has no nausea, vomiting, diarrhea or constipation.  He denied having any headache or visual changes.  He has no recent weight loss or night sweats.  He continues to tolerate his treatment with maintenance Alimta and Keytruda fairly well.  The patient is here today with repeat CT scan of the chest, abdomen and pelvis  before starting cycle #16.   MEDICAL HISTORY: Past Medical History:  Diagnosis Date   AAA (abdominal aortic aneurysm) ([place])    AAA (abdominal aortic aneurysm) without rupture ([place]) 02/04/2014   Cancer ([place])    lung   Encounter for antineoplastic chemotherapy 06/06/2016   History of hepatitis B    HNP (herniated nucleus pulposus), lumbar    L4 with radiculopathy   Hypertension    Lung cancer ([place]) 05/18/2016   Malignant neoplasm of lower lobe of left lung ([name]) 04/05/2016   Mass of left lung    Mitral insufficiency 04/06/2016   This patient will eventually need MV repair if his prognosis is good from oncology standpoint. However, his lung cancer therapy  is the priority right now. His cardiac condition won't preclude possible lung surgery.  Once his lung cancer is under control he will need a TEE to further evaluate his mitral valve anatomy and MR severity, and also have ischemic workup as tere is evidence on calcificati   Renal insufficiency 10/09/2016   S/P partial lobectomy of lung 05/11/2016   Varicose veins of legs     ALLERGIES:  is allergic to tramadol.  MEDICATIONS:  Current Outpatient Medications  Medication Sig Dispense Refill   folic acid (FOLVITE) 1 MG tablet TAKE 1 TABLET BY MOUTH EVERY DAY 90 tablet 1   lisinopril (PRINIVIL,ZESTRIL) 2.5 MG tablet Take 1 tablet (2.5 mg total) by mouth daily. 30 tablet 11   No current facility-administered medications for this visit.     SURGICAL HISTORY:  Past Surgical History:  Procedure Laterality Date  ABDOMINAL AORTIC ENDOVASCULAR STENT GRAFT N/A 03/12/2016   Procedure: ABDOMINAL AORTIC ENDOVASCULAR STENT GRAFT;  Surgeon: Conrad Le[place], MD;  Location: [facility];  Service: Vascular;  Laterality: N/A;   COLONOSCOPY     INGUINAL HERNIA REPAIR Left 1975   Left inguinal hernia   INGUINAL HERNIA REPAIR Right    LOBECTOMY Left 05/11/2016   Procedure: LEFT LOWER LOBE LOBECTOMY AND LEFT UPPER LOBE  RESECTION AND  PLACEMENT OF ON-Q;  Surgeon: [name], MD;  Location: [facility];  Service: Thoracic;  Laterality: Left;   LUMBAR LAMINECTOMY  October 22, 2012   LYMPH NODE DISSECTION Left 05/11/2016   Procedure: LYMPH NODE DISSECTION;  Surgeon: [name], MD;  Location: [facility];  Service: Thoracic;  Laterality: Left;   STAPLING OF BLEBS Left 05/11/2016   Procedure: STAPLING OF APICAL BLEB;  Surgeon: [name], MD;  Location: [facility];  Service: Thoracic;  Laterality: Left;   VIDEO ASSISTED THORACOSCOPY Left 05/18/2016   Procedure: VIDEO ASSISTED THORACOSCOPY WITH REMOVAL OF LEFT APICAL BLEB;  Surgeon: [name], MD;  Location: [facility];  Service: Thoracic;  Laterality: Left;   VIDEO ASSISTED THORACOSCOPY (VATS)/WEDGE RESECTION Left 05/11/2016   Procedure: LEFT VIDEO ASSISTED THORACOSCOPY (VATS);  Surgeon: [name], MD;  Location: [facility];  Service: Thoracic;  Laterality: Left;   VIDEO BRONCHOSCOPY N/A 05/11/2016   Procedure: VIDEO BRONCHOSCOPY, LEFT LUNG;  Surgeon: [name], MD;  Location: [facility];  Service: Thoracic;  Laterality: N/A;   VIDEO BRONCHOSCOPY N/A 05/18/2016   Procedure: VIDEO BRONCHOSCOPY WITH BRONCHIAL WASHING;  Surgeon: [name], MD;  Location: [facility];  Service: Thoracic;  Laterality: N/A;    REVIEW OF SYSTEMS:  Constitutional: positive for fatigue Eyes: negative Ears, nose, mouth, throat, and face: negative Respiratory: negative Cardiovascular: negative Gastrointestinal: negative Genitourinary:negative Integument/breast: negative Hematologic/lymphatic: negative Musculoskeletal:negative Neurological: negative Behavioral/Psych: negative Endocrine: negative Allergic/Immunologic: negative   PHYSICAL EXAMINATION: General appearance: alert, cooperative, fatigued and no distress Head: Normocephalic, without obvious abnormality, atraumatic Neck: no adenopathy, no JVD, supple, symmetrical, trachea midline and thyroid not enlarged, symmetric, no  tenderness/mass/nodules Lymph nodes: Cervical, supraclavicular, and axillary nodes normal. Resp: clear to auscultation bilaterally Back: symmetric, no curvature. ROM normal. No CVA tenderness. Cardio: regular rate and rhythm, S1, S2 normal, no murmur, click, rub or gallop GI: soft, non-tender; bowel sounds normal; no masses,  no organomegaly Extremities: extremities normal, atraumatic, no cyanosis or edema Neurologic: Alert and oriented X 3, normal strength and tone. Normal symmetric reflexes. Normal coordination and gait   ECOG PERFORMANCE STATUS: 1 - Symptomatic but completely ambulatory  Blood pressure (!) 154/88, pulse 78, temperature 97.8 F (36.6 C), temperature source Oral, resp. rate 20, height '6\' 3"'  (1.905 m), weight 159 lb 6.4 oz (72.3 kg), SpO2 97 %.  LABORATORY DATA: Lab Results  Component Value Date   WBC 6.9 10/30/2018   HGB 13.2 10/30/2018   HCT 40.2 10/30/2018   MCV 102.3 (H) 10/30/2018   PLT 446 (H) 10/30/2018      Chemistry      Component Value Date/Time   NA 136 10/30/2018 0930   NA 138 01/11/2017 1343   K 4.0 10/30/2018 0930   K 4.7 01/11/2017 1343   CL 106 10/30/2018 0930   CO2 21 (L) 10/30/2018 0930   CO2 23 01/11/2017 1343   BUN 8 10/30/2018 0930   BUN 20.2 01/11/2017 1343   CREATININE 1.28 (H) 10/30/2018 0930   CREATININE 1.6 (H) 01/11/2017 1343  Component Value Date/Time   CALCIUM 8.6 (L) 10/30/2018 0930   CALCIUM 9.3 01/11/2017 1343   ALKPHOS 94 10/30/2018 0930   ALKPHOS 89 01/11/2017 1343   AST 36 10/30/2018 0930   AST 41 (H) 01/11/2017 1343   ALT 21 10/30/2018 0930   ALT 27 01/11/2017 1343   BILITOT 0.3 10/30/2018 0930   BILITOT 0.49 01/11/2017 1343       RADIOGRAPHIC STUDIES: Ct Chest W Contrast  Result Date: 10/27/2018 CLINICAL DATA:  63 year old male with history of lung cancer. Radiation therapy complete. Chemotherapy ongoing. EXAM: CT CHEST AND ABDOMEN WITH CONTRAST TECHNIQUE: Multidetector CT imaging of the chest and  abdomen was performed following the standard protocol during bolus administration of intravenous contrast. CONTRAST:  133m OMNIPAQUE IOHEXOL 300 MG/ML  SOLN COMPARISON:  CT the chest and abdomen 07/15/2018. FINDINGS: CT CHEST FINDINGS Cardiovascular: Heart size is normal. There is no significant pericardial fluid, thickening or pericardial calcification. Aortic atherosclerosis. No definite coronary artery calcifications. Mediastinum/Nodes: No pathologically enlarged mediastinal or hilar lymph nodes. Esophagus is unremarkable in appearance. No axillary lymphadenopathy. Lungs/Pleura: Calcified pleural plaques in the right hemithorax. Trace right pleural effusion lying dependently. Minimal calcified pleural plaques in the medial aspect of the left hemithorax also noted. Status post left lower lobectomy. Compensatory hyperexpansion of the left upper lobe. Small right upper lobe 4 mm pulmonary nodule (axial image 89 of series 7), stable compared to prior examinations. No other larger more suspicious appearing pulmonary nodules or masses are noted. No acute consolidative airspace disease. Diffuse bronchial wall thickening with moderate centrilobular and paraseptal emphysema. Musculoskeletal: There are no aggressive appearing lytic or blastic lesions noted in the visualized portions of the skeleton. CT ABDOMEN FINDINGS Hepatobiliary: No suspicious cystic or solid hepatic lesions. No intra or extrahepatic biliary ductal dilatation. Gallbladder is normal in appearance. Pancreas: No pancreatic mass. No pancreatic ductal dilatation. No pancreatic or peripancreatic fluid or inflammatory changes. Spleen: Unremarkable. Adrenals/Urinary Tract: Several subcentimeter low-attenuation lesions in both kidneys, too small to characterize, but similar to prior studies and statistically likely to represent tiny cysts. Bilateral adrenal glands are normal in appearance. No hydroureteronephrosis in the visualized portions of the abdomen.  Stomach/Bowel: Normal appearance of the stomach. No pathologic dilatation of visualized portions of the small bowel or colon. Vascular/Lymphatic: Aortic atherosclerosis with stent graft which is incompletely imaged. All portions of the graft in the abdomen are patent. No lymphadenopathy noted in the abdomen. Other: No significant volume of ascites and no pneumoperitoneum noted in the visualized portions of the abdomen. Musculoskeletal: No aggressive appearing osseous lesions are noted in the visualized portions of the skeleton. IMPRESSION: 1. No findings to suggest recurrent or metastatic disease in the chest or abdomen. 2. 4 mm right upper lobe nodule, stable compared to the recent prior examination. This is reassuring. Attention on follow-up studies is recommended to ensure continued stability. 3. Diffuse bronchial wall thickening with moderate centrilobular and paraseptal emphysema; imaging findings suggestive of underlying COPD. 4. Aortic atherosclerosis. Electronically Signed   By: DVinnie LangtonM.D.   On: 10/27/2018 09:47   Ct Abdomen W Contrast  Result Date: 10/27/2018 CLINICAL DATA:  63year old male with history of lung cancer. Radiation therapy complete. Chemotherapy ongoing. EXAM: CT CHEST AND ABDOMEN WITH CONTRAST TECHNIQUE: Multidetector CT imaging of the chest and abdomen was performed following the standard protocol during bolus administration of intravenous contrast. CONTRAST:  1023mOMNIPAQUE IOHEXOL 300 MG/ML  SOLN COMPARISON:  CT the chest and abdomen 07/15/2018. FINDINGS: CT CHEST FINDINGS Cardiovascular: Heart  size is normal. There is no significant pericardial fluid, thickening or pericardial calcification. Aortic atherosclerosis. No definite coronary artery calcifications. Mediastinum/Nodes: No pathologically enlarged mediastinal or hilar lymph nodes. Esophagus is unremarkable in appearance. No axillary lymphadenopathy. Lungs/Pleura: Calcified pleural plaques in the right hemithorax.  Trace right pleural effusion lying dependently. Minimal calcified pleural plaques in the medial aspect of the left hemithorax also noted. Status post left lower lobectomy. Compensatory hyperexpansion of the left upper lobe. Small right upper lobe 4 mm pulmonary nodule (axial image 89 of series 7), stable compared to prior examinations. No other larger more suspicious appearing pulmonary nodules or masses are noted. No acute consolidative airspace disease. Diffuse bronchial wall thickening with moderate centrilobular and paraseptal emphysema. Musculoskeletal: There are no aggressive appearing lytic or blastic lesions noted in the visualized portions of the skeleton. CT ABDOMEN FINDINGS Hepatobiliary: No suspicious cystic or solid hepatic lesions. No intra or extrahepatic biliary ductal dilatation. Gallbladder is normal in appearance. Pancreas: No pancreatic mass. No pancreatic ductal dilatation. No pancreatic or peripancreatic fluid or inflammatory changes. Spleen: Unremarkable. Adrenals/Urinary Tract: Several subcentimeter low-attenuation lesions in both kidneys, too small to characterize, but similar to prior studies and statistically likely to represent tiny cysts. Bilateral adrenal glands are normal in appearance. No hydroureteronephrosis in the visualized portions of the abdomen. Stomach/Bowel: Normal appearance of the stomach. No pathologic dilatation of visualized portions of the small bowel or colon. Vascular/Lymphatic: Aortic atherosclerosis with stent graft which is incompletely imaged. All portions of the graft in the abdomen are patent. No lymphadenopathy noted in the abdomen. Other: No significant volume of ascites and no pneumoperitoneum noted in the visualized portions of the abdomen. Musculoskeletal: No aggressive appearing osseous lesions are noted in the visualized portions of the skeleton. IMPRESSION: 1. No findings to suggest recurrent or metastatic disease in the chest or abdomen. 2. 4 mm right  upper lobe nodule, stable compared to the recent prior examination. This is reassuring. Attention on follow-up studies is recommended to ensure continued stability. 3. Diffuse bronchial wall thickening with moderate centrilobular and paraseptal emphysema; imaging findings suggestive of underlying COPD. 4. Aortic atherosclerosis. Electronically Signed   By: Vinnie Langton M.D.   On: 10/27/2018 09:47    ASSESSMENT AND PLAN:  This is a very pleasant 63 years old white male with a stage IB non-small cell lung cancer, adenocarcinoma status post wedge resection of the left upper lobe followed by 4 cycles of adjuvant systemic chemotherapy with cisplatin and Alimta and he tolerated his treatment well except for fatigue. The patient has been on observation since June 2018.   The recent imaging studies including CT scan of the chest as well as a PET scan showed evidence for disease recurrence with metastatic disease presented with bilateral pulmonary nodules as well as destructive bone lesions at T4 vertebral body, biopsy proven to be metastatic adenocarcinoma. Molecular studies by foundation 1 showed no actionable mutations.  PDL 1 expression was negative. The patient is currently undergoing treatment with carboplatin, Alimta and Ketruda (pembrolizumab) status post 15 cycles. Starting from cycle #5 the patient will be treated with maintenance Alimta and Ketruda (pembrolizumab) every 3 weeks. The patient has been tolerating this treatment well with no concerning adverse effects. He had repeat CT scan of the chest, abdomen pelvis performed recently.  I personally and independently reviewed the scans and discussed the results with the patient today. His scan showed no concerning findings for disease progression. I recommended for him to continue his current maintenance treatment and he  will proceed with cycle #16 today. For hypertension I strongly advised the patient to take his blood pressure medication as  prescribed and to monitor it closely at home. I will see him back for follow-up visit in 3 weeks for evaluation before the next cycle of his treatment. The patient was advised to call immediately if he has any concerning symptoms in the interval. The patient voices understanding of current disease status and treatment options and is in agreement with the current care plan. All questions were answered. The patient knows to call the clinic with any problems, questions or concerns. We can certainly see the patient much sooner if necessary.  Disclaimer: This note was dictated with voice recognition software. Similar sounding words can inadvertently be transcribed and may not be corrected upon review.

## 2018-11-20 ENCOUNTER — Inpatient Hospital Stay: Payer: No Typology Code available for payment source

## 2018-11-20 ENCOUNTER — Inpatient Hospital Stay (HOSPITAL_BASED_OUTPATIENT_CLINIC_OR_DEPARTMENT_OTHER): Payer: No Typology Code available for payment source | Admitting: Internal Medicine

## 2018-11-20 ENCOUNTER — Other Ambulatory Visit: Payer: Self-pay

## 2018-11-20 ENCOUNTER — Telehealth: Payer: Self-pay | Admitting: Internal Medicine

## 2018-11-20 ENCOUNTER — Encounter: Payer: Self-pay | Admitting: Internal Medicine

## 2018-11-20 VITALS — BP 167/89

## 2018-11-20 VITALS — BP 167/97 | HR 97 | Temp 98.5°F | Resp 20 | Ht 75.0 in | Wt 158.9 lb

## 2018-11-20 DIAGNOSIS — Z5112 Encounter for antineoplastic immunotherapy: Secondary | ICD-10-CM | POA: Diagnosis not present

## 2018-11-20 DIAGNOSIS — C3432 Malignant neoplasm of lower lobe, left bronchus or lung: Secondary | ICD-10-CM

## 2018-11-20 DIAGNOSIS — C3492 Malignant neoplasm of unspecified part of left bronchus or lung: Secondary | ICD-10-CM

## 2018-11-20 DIAGNOSIS — C7951 Secondary malignant neoplasm of bone: Secondary | ICD-10-CM | POA: Diagnosis not present

## 2018-11-20 DIAGNOSIS — Z5111 Encounter for antineoplastic chemotherapy: Secondary | ICD-10-CM

## 2018-11-20 DIAGNOSIS — I10 Essential (primary) hypertension: Secondary | ICD-10-CM

## 2018-11-20 LAB — CMP (CANCER CENTER ONLY)
ALT: 21 U/L (ref 0–44)
AST: 38 U/L (ref 15–41)
Albumin: 3.4 g/dL — ABNORMAL LOW (ref 3.5–5.0)
Alkaline Phosphatase: 95 U/L (ref 38–126)
Anion gap: 8 (ref 5–15)
BUN: 9 mg/dL (ref 8–23)
CO2: 20 mmol/L — ABNORMAL LOW (ref 22–32)
Calcium: 8.8 mg/dL — ABNORMAL LOW (ref 8.9–10.3)
Chloride: 106 mmol/L (ref 98–111)
Creatinine: 1.3 mg/dL — ABNORMAL HIGH (ref 0.61–1.24)
GFR, Est AFR Am: >60 mL/min (ref >60)
GFR, Estimated: 57 mL/min — ABNORMAL LOW (ref >60)
Glucose, Bld: 93 mg/dL (ref 70–99)
Potassium: 4.4 mmol/L (ref 3.5–5.1)
Sodium: 134 mmol/L — ABNORMAL LOW (ref 135–145)
Total Bilirubin: 0.4 mg/dL (ref 0.3–1.2)
Total Protein: 7.1 g/dL (ref 6.5–8.1)

## 2018-11-20 LAB — CBC WITH DIFFERENTIAL (CANCER CENTER ONLY)
Abs Immature Granulocytes: 0.04 10*3/uL (ref 0.00–0.07)
Basophils Absolute: 0.1 10*3/uL (ref 0.0–0.1)
Basophils Relative: 1 %
Eosinophils Absolute: 0.2 10*3/uL (ref 0.0–0.5)
Eosinophils Relative: 2 %
HCT: 39.1 % (ref 39.0–52.0)
Hemoglobin: 13 g/dL (ref 13.0–17.0)
Immature Granulocytes: 1 %
Lymphocytes Relative: 16 %
Lymphs Abs: 1.1 10*3/uL (ref 0.7–4.0)
MCH: 33.7 pg (ref 26.0–34.0)
MCHC: 33.2 g/dL (ref 30.0–36.0)
MCV: 101.3 fL — ABNORMAL HIGH (ref 80.0–100.0)
Monocytes Absolute: 1.5 10*3/uL — ABNORMAL HIGH (ref 0.1–1.0)
Monocytes Relative: 21 %
Neutro Abs: 4.3 10*3/uL (ref 1.7–7.7)
Neutrophils Relative %: 59 %
Platelet Count: 464 10*3/uL — ABNORMAL HIGH (ref 150–400)
RBC: 3.86 MIL/uL — ABNORMAL LOW (ref 4.22–5.81)
RDW: 14.7 % (ref 11.5–15.5)
WBC Count: 7.2 10*3/uL (ref 4.0–10.5)
nRBC: 0 % (ref 0.0–0.2)

## 2018-11-20 MED ORDER — ONDANSETRON HCL 4 MG/2ML IJ SOLN
INTRAMUSCULAR | Status: AC
  Filled 2018-11-20: qty 4

## 2018-11-20 MED ORDER — DEXAMETHASONE SODIUM PHOSPHATE 10 MG/ML IJ SOLN
10.0000 mg | Freq: Once | INTRAMUSCULAR | Status: AC
  Administered 2018-11-20: 12:00:00 10 mg via INTRAVENOUS

## 2018-11-20 MED ORDER — SODIUM CHLORIDE 0.9 % IV SOLN
200.0000 mg | Freq: Once | INTRAVENOUS | Status: AC
  Administered 2018-11-20: 200 mg via INTRAVENOUS
  Filled 2018-11-20: qty 8

## 2018-11-20 MED ORDER — DEXAMETHASONE SODIUM PHOSPHATE 10 MG/ML IJ SOLN
INTRAMUSCULAR | Status: AC
  Filled 2018-11-20: qty 1

## 2018-11-20 MED ORDER — ONDANSETRON HCL 4 MG/2ML IJ SOLN
8.0000 mg | Freq: Once | INTRAMUSCULAR | Status: AC
  Administered 2018-11-20: 8 mg via INTRAVENOUS

## 2018-11-20 MED ORDER — SODIUM CHLORIDE 0.9 % IV SOLN
Freq: Once | INTRAVENOUS | Status: AC
  Administered 2018-11-20: 12:00:00 via INTRAVENOUS
  Filled 2018-11-20: qty 250

## 2018-11-20 MED ORDER — SODIUM CHLORIDE 0.9 % IV SOLN
1000.0000 mg | Freq: Once | INTRAVENOUS | Status: AC
  Administered 2018-11-20: 1000 mg via INTRAVENOUS
  Filled 2018-11-20: qty 40

## 2018-11-20 NOTE — Patient Instructions (Signed)
[facility] Discharge Instructions for Patients Receiving Chemotherapy  Today you received the following chemotherapy agents: Keytruda and Alimta.  To help prevent nausea and vomiting after your treatment, we encourage you to take your nausea medication as directed.   If you develop nausea and vomiting that is not controlled by your nausea medication, call the clinic.   BELOW ARE SYMPTOMS THAT SHOULD BE REPORTED IMMEDIATELY:  *FEVER GREATER THAN 100.5 F  *CHILLS WITH OR WITHOUT FEVER  NAUSEA AND VOMITING THAT IS NOT CONTROLLED WITH YOUR NAUSEA MEDICATION  *UNUSUAL SHORTNESS OF BREATH  *UNUSUAL BRUISING OR BLEEDING  TENDERNESS IN MOUTH AND THROAT WITH OR WITHOUT PRESENCE OF ULCERS  *URINARY PROBLEMS  *BOWEL PROBLEMS  UNUSUAL RASH Items with * indicate a potential emergency and should be followed up as soon as possible.  Feel free to call the clinic should you have any questions or concerns. The clinic phone number is [phone].  Please show the [facility] at check-in to the Emergency Department and triage nurse.

## 2018-11-20 NOTE — Progress Notes (Signed)
[facility] [phone]   [phone]  OFFICE PROGRESS NOTE  Patient, No Pcp Per No address on file  DIAGNOSIS: Metastatic non-small cell lung cancer, adenocarcinoma initially diagnosed as stage IB (T2a, N0, M0) non-small cell lung cancer, adenocarcinoma diagnosed in December 2017.  The patient has evidence for disease recurrence in July 2019.  Biomarker Findings Tumor Mutational Burden - TMB-Intermediate (16 Muts/Mb) Microsatellite status - MS-Stable Genomic Findings For a complete list of the genes assayed, please refer to the Appendix. KIT amplification KRAS M60O SMAD4 splice site 4599-7F>S FS23 I232F 7 Disease relevant genes with no reportable alterations: EGFR, ALK, BRAF, MET, RET, ERBB2, ROS1   PDL 1 expression is 0%  PRIOR THERAPY:  1) status post left lower lobectomy as well as wedge resection of the left upper lobe on 05/11/2016. 2) Adjuvant systemic chemotherapy with cisplatin 75 MG/M2 and Alimta 500 MG/M2 every 3 weeks. First dose 07/03/2016. Status post 4 cycles.  CURRENT THERAPY: Systemic chemotherapy with carboplatin for AUC of 5, Alimta 500 mg/M2 and Keytruda 200 mg IV every 3 weeks.  Status post 16 cycles.  Starting from cycle #5 the patient will be treated with maintenance Alimta and Ketruda (pembrolizumab) every 3 weeks.  INTERVAL HISTORY: William Kales [name] 63 y.o. male returns to the clinic today for follow-up visit.  The patient is feeling fine today with no concerning complaints.  His blood pressure is elevated today but he checks it at home and it usually within the normal range.  He denied having any chest pain, shortness of breath, cough or hemoptysis.  He denied having any fever or chills.  He has no nausea, vomiting, diarrhea or constipation.  He has no headache or visual changes.  He continues to tolerate his maintenance treatment fairly well.  The patient is here today for evaluation before starting cycle  #17.   MEDICAL HISTORY: Past Medical History:  Diagnosis Date   AAA (abdominal aortic aneurysm) ([facility])    AAA (abdominal aortic aneurysm) without rupture ([facility]) 02/04/2014   Cancer ([facility])    lung   Encounter for antineoplastic chemotherapy 06/06/2016   History of hepatitis B    HNP (herniated nucleus pulposus), lumbar    L4 with radiculopathy   Hypertension    Lung cancer ([place] [facility]) 05/18/2016   Malignant neoplasm of lower lobe of left lung ([facility]) 04/05/2016   Mass of left lung    Mitral insufficiency 04/06/2016   This patient will eventually need MV repair if his prognosis is good from oncology standpoint. However, his lung cancer therapy  is the priority right now. His cardiac condition won't preclude possible lung surgery.  Once his lung cancer is under control he will need a TEE to further evaluate his mitral valve anatomy and MR severity, and also have ischemic workup as tere is evidence on calcificati   Renal insufficiency 10/09/2016   S/P partial lobectomy of lung 05/11/2016   Varicose veins of legs     ALLERGIES:  is allergic to tramadol.  MEDICATIONS:  Current Outpatient Medications  Medication Sig Dispense Refill   folic acid (FOLVITE) 1 MG tablet TAKE 1 TABLET BY MOUTH EVERY DAY 90 tablet 1   lisinopril (PRINIVIL,ZESTRIL) 2.5 MG tablet Take 1 tablet (2.5 mg total) by mouth daily. 30 tablet 11   No current facility-administered medications for this visit.     SURGICAL HISTORY:  Past Surgical History:  Procedure Laterality Date   ABDOMINAL AORTIC ENDOVASCULAR STENT  GRAFT N/A 03/12/2016   Procedure: ABDOMINAL AORTIC ENDOVASCULAR STENT GRAFT;  Surgeon: Conrad Whiting, MD;  Location: [facility];  Service: Vascular;  Laterality: N/A;   COLONOSCOPY     INGUINAL HERNIA REPAIR Left 1975   Left inguinal hernia   INGUINAL HERNIA REPAIR Right    LOBECTOMY Left 05/11/2016   Procedure: LEFT LOWER LOBE LOBECTOMY AND LEFT UPPER LOBE  RESECTION AND PLACEMENT OF ON-Q;  Surgeon:  [name], MD;  Location: [facility];  Service: Thoracic;  Laterality: Left;   LUMBAR LAMINECTOMY  October 22, 2012   LYMPH NODE DISSECTION Left 05/11/2016   Procedure: LYMPH NODE DISSECTION;  Surgeon: [name], MD;  Location: [facility];  Service: Thoracic;  Laterality: Left;   STAPLING OF BLEBS Left 05/11/2016   Procedure: STAPLING OF APICAL BLEB;  Surgeon: [name], MD;  Location: [facility];  Service: Thoracic;  Laterality: Left;   VIDEO ASSISTED THORACOSCOPY Left 05/18/2016   Procedure: VIDEO ASSISTED THORACOSCOPY WITH REMOVAL OF LEFT APICAL BLEB;  Surgeon: [name], MD;  Location: [facility];  Service: Thoracic;  Laterality: Left;   VIDEO ASSISTED THORACOSCOPY (VATS)/WEDGE RESECTION Left 05/11/2016   Procedure: LEFT VIDEO ASSISTED THORACOSCOPY (VATS);  Surgeon: [name], MD;  Location: [facility];  Service: Thoracic;  Laterality: Left;   VIDEO BRONCHOSCOPY N/A 05/11/2016   Procedure: VIDEO BRONCHOSCOPY, LEFT LUNG;  Surgeon: [name], MD;  Location: [facility];  Service: Thoracic;  Laterality: N/A;   VIDEO BRONCHOSCOPY N/A 05/18/2016   Procedure: VIDEO BRONCHOSCOPY WITH BRONCHIAL WASHING;  Surgeon: [name], MD;  Location: [facility];  Service: Thoracic;  Laterality: N/A;    REVIEW OF SYSTEMS:  A comprehensive review of systems was negative.   PHYSICAL EXAMINATION: General appearance: alert, cooperative and no distress Head: Normocephalic, without obvious abnormality, atraumatic Neck: no adenopathy, no JVD, supple, symmetrical, trachea midline and thyroid not enlarged, symmetric, no tenderness/mass/nodules Lymph nodes: Cervical, supraclavicular, and axillary nodes normal. Resp: clear to auscultation bilaterally Back: symmetric, no curvature. ROM normal. No CVA tenderness. Cardio: regular rate and rhythm, S1, S2 normal, no murmur, click, rub or gallop GI: soft, non-tender; bowel sounds normal; no masses,  no organomegaly Extremities: extremities normal,  atraumatic, no cyanosis or edema   ECOG PERFORMANCE STATUS: 1 - Symptomatic but completely ambulatory  Blood pressure (!) 167/97, pulse 97, temperature 98.5 F (36.9 C), temperature source Oral, resp. rate 20, height '6\' 3"'$  (1.905 m), weight 158 lb 14.4 oz (72.1 kg), SpO2 100 %.  LABORATORY DATA: Lab Results  Component Value Date   WBC 7.2 11/20/2018   HGB 13.0 11/20/2018   HCT 39.1 11/20/2018   MCV 101.3 (H) 11/20/2018   PLT 464 (H) 11/20/2018      Chemistry      Component Value Date/Time   NA 136 10/30/2018 0930   NA 138 01/11/2017 1343   K 4.0 10/30/2018 0930   K 4.7 01/11/2017 1343   CL 106 10/30/2018 0930   CO2 21 (L) 10/30/2018 0930   CO2 23 01/11/2017 1343   BUN 8 10/30/2018 0930   BUN 20.2 01/11/2017 1343   CREATININE 1.28 (H) 10/30/2018 0930   CREATININE 1.6 (H) 01/11/2017 1343      Component Value Date/Time   CALCIUM 8.6 (L) 10/30/2018 0930   CALCIUM 9.3 01/11/2017 1343   ALKPHOS 94 10/30/2018 0930   ALKPHOS 89 01/11/2017 1343   AST 36 10/30/2018 0930   AST 41 (H) 01/11/2017 1343   ALT 21  10/30/2018 0930   ALT 27 01/11/2017 1343   BILITOT 0.3 10/30/2018 0930   BILITOT 0.49 01/11/2017 1343       RADIOGRAPHIC STUDIES: Ct Chest W Contrast  Result Date: 10/27/2018 CLINICAL DATA:  63 year old male with history of lung cancer. Radiation therapy complete. Chemotherapy ongoing. EXAM: CT CHEST AND ABDOMEN WITH CONTRAST TECHNIQUE: Multidetector CT imaging of the chest and abdomen was performed following the standard protocol during bolus administration of intravenous contrast. CONTRAST:  156m OMNIPAQUE IOHEXOL 300 MG/ML  SOLN COMPARISON:  CT the chest and abdomen 07/15/2018. FINDINGS: CT CHEST FINDINGS Cardiovascular: Heart size is normal. There is no significant pericardial fluid, thickening or pericardial calcification. Aortic atherosclerosis. No definite coronary artery calcifications. Mediastinum/Nodes: No pathologically enlarged mediastinal or hilar lymph nodes.  Esophagus is unremarkable in appearance. No axillary lymphadenopathy. Lungs/Pleura: Calcified pleural plaques in the right hemithorax. Trace right pleural effusion lying dependently. Minimal calcified pleural plaques in the medial aspect of the left hemithorax also noted. Status post left lower lobectomy. Compensatory hyperexpansion of the left upper lobe. Small right upper lobe 4 mm pulmonary nodule (axial image 89 of series 7), stable compared to prior examinations. No other larger more suspicious appearing pulmonary nodules or masses are noted. No acute consolidative airspace disease. Diffuse bronchial wall thickening with moderate centrilobular and paraseptal emphysema. Musculoskeletal: There are no aggressive appearing lytic or blastic lesions noted in the visualized portions of the skeleton. CT ABDOMEN FINDINGS Hepatobiliary: No suspicious cystic or solid hepatic lesions. No intra or extrahepatic biliary ductal dilatation. Gallbladder is normal in appearance. Pancreas: No pancreatic mass. No pancreatic ductal dilatation. No pancreatic or peripancreatic fluid or inflammatory changes. Spleen: Unremarkable. Adrenals/Urinary Tract: Several subcentimeter low-attenuation lesions in both kidneys, too small to characterize, but similar to prior studies and statistically likely to represent tiny cysts. Bilateral adrenal glands are normal in appearance. No hydroureteronephrosis in the visualized portions of the abdomen. Stomach/Bowel: Normal appearance of the stomach. No pathologic dilatation of visualized portions of the small bowel or colon. Vascular/Lymphatic: Aortic atherosclerosis with stent graft which is incompletely imaged. All portions of the graft in the abdomen are patent. No lymphadenopathy noted in the abdomen. Other: No significant volume of ascites and no pneumoperitoneum noted in the visualized portions of the abdomen. Musculoskeletal: No aggressive appearing osseous lesions are noted in the visualized  portions of the skeleton. IMPRESSION: 1. No findings to suggest recurrent or metastatic disease in the chest or abdomen. 2. 4 mm right upper lobe nodule, stable compared to the recent prior examination. This is reassuring. Attention on follow-up studies is recommended to ensure continued stability. 3. Diffuse bronchial wall thickening with moderate centrilobular and paraseptal emphysema; imaging findings suggestive of underlying COPD. 4. Aortic atherosclerosis. Electronically Signed   By: DVinnie LangtonM.D.   On: 10/27/2018 09:47   Ct Abdomen W Contrast  Result Date: 10/27/2018 CLINICAL DATA:  63year old male with history of lung cancer. Radiation therapy complete. Chemotherapy ongoing. EXAM: CT CHEST AND ABDOMEN WITH CONTRAST TECHNIQUE: Multidetector CT imaging of the chest and abdomen was performed following the standard protocol during bolus administration of intravenous contrast. CONTRAST:  1043mOMNIPAQUE IOHEXOL 300 MG/ML  SOLN COMPARISON:  CT the chest and abdomen 07/15/2018. FINDINGS: CT CHEST FINDINGS Cardiovascular: Heart size is normal. There is no significant pericardial fluid, thickening or pericardial calcification. Aortic atherosclerosis. No definite coronary artery calcifications. Mediastinum/Nodes: No pathologically enlarged mediastinal or hilar lymph nodes. Esophagus is unremarkable in appearance. No axillary lymphadenopathy. Lungs/Pleura: Calcified pleural plaques in the right hemithorax.  Trace right pleural effusion lying dependently. Minimal calcified pleural plaques in the medial aspect of the left hemithorax also noted. Status post left lower lobectomy. Compensatory hyperexpansion of the left upper lobe. Small right upper lobe 4 mm pulmonary nodule (axial image 89 of series 7), stable compared to prior examinations. No other larger more suspicious appearing pulmonary nodules or masses are noted. No acute consolidative airspace disease. Diffuse bronchial wall thickening with moderate  centrilobular and paraseptal emphysema. Musculoskeletal: There are no aggressive appearing lytic or blastic lesions noted in the visualized portions of the skeleton. CT ABDOMEN FINDINGS Hepatobiliary: No suspicious cystic or solid hepatic lesions. No intra or extrahepatic biliary ductal dilatation. Gallbladder is normal in appearance. Pancreas: No pancreatic mass. No pancreatic ductal dilatation. No pancreatic or peripancreatic fluid or inflammatory changes. Spleen: Unremarkable. Adrenals/Urinary Tract: Several subcentimeter low-attenuation lesions in both kidneys, too small to characterize, but similar to prior studies and statistically likely to represent tiny cysts. Bilateral adrenal glands are normal in appearance. No hydroureteronephrosis in the visualized portions of the abdomen. Stomach/Bowel: Normal appearance of the stomach. No pathologic dilatation of visualized portions of the small bowel or colon. Vascular/Lymphatic: Aortic atherosclerosis with stent graft which is incompletely imaged. All portions of the graft in the abdomen are patent. No lymphadenopathy noted in the abdomen. Other: No significant volume of ascites and no pneumoperitoneum noted in the visualized portions of the abdomen. Musculoskeletal: No aggressive appearing osseous lesions are noted in the visualized portions of the skeleton. IMPRESSION: 1. No findings to suggest recurrent or metastatic disease in the chest or abdomen. 2. 4 mm right upper lobe nodule, stable compared to the recent prior examination. This is reassuring. Attention on follow-up studies is recommended to ensure continued stability. 3. Diffuse bronchial wall thickening with moderate centrilobular and paraseptal emphysema; imaging findings suggestive of underlying COPD. 4. Aortic atherosclerosis. Electronically Signed   By: Vinnie Langton M.D.   On: 10/27/2018 09:47    ASSESSMENT AND PLAN:  This is a very pleasant 63 years old white male with a stage IB non-small  cell lung cancer, adenocarcinoma status post wedge resection of the left upper lobe followed by 4 cycles of adjuvant systemic chemotherapy with cisplatin and Alimta and he tolerated his treatment well except for fatigue. The patient has been on observation since June 2018.   The recent imaging studies including CT scan of the chest as well as a PET scan showed evidence for disease recurrence with metastatic disease presented with bilateral pulmonary nodules as well as destructive bone lesions at T4 vertebral body, biopsy proven to be metastatic adenocarcinoma. Molecular studies by foundation 1 showed no actionable mutations.  PDL 1 expression was negative. The patient is currently undergoing treatment with carboplatin, Alimta and Ketruda (pembrolizumab) status post 16 cycles.  Starting from cycle #5 he is on maintenance treatment with Alimta and Keytruda every 3 weeks. He continues to tolerate his treatment well with no concerning adverse effects. I recommended for the patient to proceed with cycle #17 today as planned. For hypertension he was advised to take his blood pressure medication as prescribed and to consult with his primary care physician for adjustment of his medication if needed. The patient will come back for follow-up visit in 3 weeks for evaluation before the next cycle of his treatment. He was advised to call immediately if he has any concerning symptoms in the interval. The patient voices understanding of current disease status and treatment options and is in agreement with the current care plan.  All questions were answered. The patient knows to call the clinic with any problems, questions or concerns. We can certainly see the patient much sooner if necessary.  Disclaimer: This note was dictated with voice recognition software. Similar sounding words can inadvertently be transcribed and may not be corrected upon review.

## 2018-11-20 NOTE — Telephone Encounter (Signed)
Gave avs and calendar ° °

## 2018-12-10 ENCOUNTER — Telehealth: Payer: Self-pay

## 2018-12-10 NOTE — Telephone Encounter (Signed)
Pt does not have vm box set up so I was unable to leave a message regarding pre-screening for his appointment on 8/20.

## 2018-12-11 ENCOUNTER — Inpatient Hospital Stay: Payer: No Typology Code available for payment source

## 2018-12-11 ENCOUNTER — Telehealth: Payer: Self-pay | Admitting: Internal Medicine

## 2018-12-11 ENCOUNTER — Encounter: Payer: Self-pay | Admitting: Internal Medicine

## 2018-12-11 ENCOUNTER — Other Ambulatory Visit: Payer: Self-pay

## 2018-12-11 ENCOUNTER — Inpatient Hospital Stay: Payer: No Typology Code available for payment source | Attending: Internal Medicine | Admitting: Internal Medicine

## 2018-12-11 VITALS — BP 144/85 | HR 90 | Temp 98.5°F | Resp 20 | Ht 75.0 in | Wt 155.7 lb

## 2018-12-11 DIAGNOSIS — C3492 Malignant neoplasm of unspecified part of left bronchus or lung: Secondary | ICD-10-CM

## 2018-12-11 DIAGNOSIS — Z5111 Encounter for antineoplastic chemotherapy: Secondary | ICD-10-CM

## 2018-12-11 DIAGNOSIS — C3432 Malignant neoplasm of lower lobe, left bronchus or lung: Secondary | ICD-10-CM

## 2018-12-11 DIAGNOSIS — Z79899 Other long term (current) drug therapy: Secondary | ICD-10-CM | POA: Insufficient documentation

## 2018-12-11 DIAGNOSIS — Z5112 Encounter for antineoplastic immunotherapy: Secondary | ICD-10-CM | POA: Diagnosis not present

## 2018-12-11 DIAGNOSIS — C7951 Secondary malignant neoplasm of bone: Secondary | ICD-10-CM | POA: Diagnosis not present

## 2018-12-11 DIAGNOSIS — I10 Essential (primary) hypertension: Secondary | ICD-10-CM

## 2018-12-11 DIAGNOSIS — R5383 Other fatigue: Secondary | ICD-10-CM | POA: Diagnosis not present

## 2018-12-11 LAB — CBC WITH DIFFERENTIAL (CANCER CENTER ONLY)
Abs Immature Granulocytes: 0.03 10*3/uL (ref 0.00–0.07)
Basophils Absolute: 0.1 10*3/uL (ref 0.0–0.1)
Basophils Relative: 1 %
Eosinophils Absolute: 0.2 10*3/uL (ref 0.0–0.5)
Eosinophils Relative: 2 %
HCT: 40.4 % (ref 39.0–52.0)
Hemoglobin: 13.2 g/dL (ref 13.0–17.0)
Immature Granulocytes: 0 %
Lymphocytes Relative: 16 %
Lymphs Abs: 1.1 10*3/uL (ref 0.7–4.0)
MCH: 33.3 pg (ref 26.0–34.0)
MCHC: 32.7 g/dL (ref 30.0–36.0)
MCV: 102 fL — ABNORMAL HIGH (ref 80.0–100.0)
Monocytes Absolute: 1.2 10*3/uL — ABNORMAL HIGH (ref 0.1–1.0)
Monocytes Relative: 18 %
Neutro Abs: 4.4 10*3/uL (ref 1.7–7.7)
Neutrophils Relative %: 63 %
Platelet Count: 483 10*3/uL — ABNORMAL HIGH (ref 150–400)
RBC: 3.96 MIL/uL — ABNORMAL LOW (ref 4.22–5.81)
RDW: 14.4 % (ref 11.5–15.5)
WBC Count: 7 10*3/uL (ref 4.0–10.5)
nRBC: 0 % (ref 0.0–0.2)

## 2018-12-11 LAB — CMP (CANCER CENTER ONLY)
ALT: 16 U/L (ref 0–44)
AST: 31 U/L (ref 15–41)
Albumin: 3.1 g/dL — ABNORMAL LOW (ref 3.5–5.0)
Alkaline Phosphatase: 100 U/L (ref 38–126)
Anion gap: 11 (ref 5–15)
BUN: 9 mg/dL (ref 8–23)
CO2: 17 mmol/L — ABNORMAL LOW (ref 22–32)
Calcium: 8.6 mg/dL — ABNORMAL LOW (ref 8.9–10.3)
Chloride: 105 mmol/L (ref 98–111)
Creatinine: 1.25 mg/dL — ABNORMAL HIGH (ref 0.61–1.24)
GFR, Est AFR Am: >60 mL/min (ref >60)
GFR, Estimated: >60 mL/min (ref >60)
Glucose, Bld: 89 mg/dL (ref 70–99)
Potassium: 3.8 mmol/L (ref 3.5–5.1)
Sodium: 133 mmol/L — ABNORMAL LOW (ref 135–145)
Total Bilirubin: 0.3 mg/dL (ref 0.3–1.2)
Total Protein: 7 g/dL (ref 6.5–8.1)

## 2018-12-11 MED ORDER — SODIUM CHLORIDE 0.9 % IV SOLN
200.0000 mg | Freq: Once | INTRAVENOUS | Status: AC
  Administered 2018-12-11: 200 mg via INTRAVENOUS
  Filled 2018-12-11: qty 8

## 2018-12-11 MED ORDER — SODIUM CHLORIDE 0.9 % IV SOLN
Freq: Once | INTRAVENOUS | Status: AC
  Administered 2018-12-11: 13:00:00 via INTRAVENOUS
  Filled 2018-12-11: qty 250

## 2018-12-11 MED ORDER — ONDANSETRON HCL 4 MG/2ML IJ SOLN
INTRAMUSCULAR | Status: AC
  Filled 2018-12-11: qty 2

## 2018-12-11 MED ORDER — ONDANSETRON HCL 4 MG/2ML IJ SOLN
8.0000 mg | Freq: Once | INTRAMUSCULAR | Status: AC
  Administered 2018-12-11: 8 mg via INTRAVENOUS

## 2018-12-11 MED ORDER — CYANOCOBALAMIN 1000 MCG/ML IJ SOLN
INTRAMUSCULAR | Status: AC
  Filled 2018-12-11: qty 1

## 2018-12-11 MED ORDER — DEXAMETHASONE SODIUM PHOSPHATE 10 MG/ML IJ SOLN
10.0000 mg | Freq: Once | INTRAMUSCULAR | Status: AC
  Administered 2018-12-11: 10 mg via INTRAVENOUS

## 2018-12-11 MED ORDER — CYANOCOBALAMIN 1000 MCG/ML IJ SOLN
1000.0000 ug | Freq: Once | INTRAMUSCULAR | Status: AC
  Administered 2018-12-11: 1000 ug via INTRAMUSCULAR

## 2018-12-11 MED ORDER — ONDANSETRON HCL 4 MG/2ML IJ SOLN
INTRAMUSCULAR | Status: AC
  Filled 2018-12-11: qty 4

## 2018-12-11 MED ORDER — DEXAMETHASONE SODIUM PHOSPHATE 10 MG/ML IJ SOLN
INTRAMUSCULAR | Status: AC
  Filled 2018-12-11: qty 1

## 2018-12-11 MED ORDER — SODIUM CHLORIDE 0.9 % IV SOLN
520.0000 mg/m2 | Freq: Once | INTRAVENOUS | Status: AC
  Administered 2018-12-11: 1000 mg via INTRAVENOUS
  Filled 2018-12-11: qty 40

## 2018-12-11 NOTE — Patient Instructions (Signed)
[facility] Discharge Instructions for Patients Receiving Chemotherapy  Today you received the following chemotherapy agents: Keytruda, Alimta  To help prevent nausea and vomiting after your treatment, we encourage you to take your nausea medication as directed.   If you develop nausea and vomiting that is not controlled by your nausea medication, call the clinic.   BELOW ARE SYMPTOMS THAT SHOULD BE REPORTED IMMEDIATELY:  *FEVER GREATER THAN 100.5 F  *CHILLS WITH OR WITHOUT FEVER  NAUSEA AND VOMITING THAT IS NOT CONTROLLED WITH YOUR NAUSEA MEDICATION  *UNUSUAL SHORTNESS OF BREATH  *UNUSUAL BRUISING OR BLEEDING  TENDERNESS IN MOUTH AND THROAT WITH OR WITHOUT PRESENCE OF ULCERS  *URINARY PROBLEMS  *BOWEL PROBLEMS  UNUSUAL RASH Items with * indicate a potential emergency and should be followed up as soon as possible.  Feel free to call the clinic should you have any questions or concerns. The clinic phone number is [phone].  Please show the [facility] at check-in to the Emergency Department and triage nurse.

## 2018-12-11 NOTE — Progress Notes (Signed)
[name] Telephone:(336) [phone]   Fax:(336) [phone]  OFFICE PROGRESS NOTE  Patient, No Pcp Per No address on file  DIAGNOSIS: Metastatic non-small cell lung cancer, adenocarcinoma initially diagnosed as stage IB (T2a, N0, M0) non-small cell lung cancer, adenocarcinoma diagnosed in December 2017.  The patient has evidence for disease recurrence in July 2019.  Biomarker Findings Tumor Mutational Burden - TMB-Intermediate (16 Muts/Mb) Microsatellite status - MS-Stable Genomic Findings For a complete list of the genes assayed, please refer to the Appendix. KIT amplification KRAS H47M SMAD4 splice site 5465-0P>T WS56 I232F 7 Disease relevant genes with no reportable alterations: EGFR, ALK, BRAF, MET, RET, ERBB2, ROS1   PDL 1 expression is 0%  PRIOR THERAPY:  1) status post left lower lobectomy as well as wedge resection of the left upper lobe on 05/11/2016. 2) Adjuvant systemic chemotherapy with cisplatin 75 MG/M2 and Alimta 500 MG/M2 every 3 weeks. First dose 07/03/2016. Status post 4 cycles.  CURRENT THERAPY: Systemic chemotherapy with carboplatin for AUC of 5, Alimta 500 mg/M2 and Keytruda 200 mg IV every 3 weeks.  Status post 18 cycles.  Starting from cycle #5 the patient will be treated with maintenance Alimta and Ketruda (pembrolizumab) every 3 weeks.  INTERVAL HISTORY: [name] 63 y.o. male returns to the clinic today for follow-up visit.  The patient is feeling fine today with no concerning complaints except for mild fatigue.  He does not do much activity these days because of the concern about the COVID pandemic.  He denied having any chest pain, shortness of breath, cough or hemoptysis.  He denied having any fever or chills.  He has no nausea, vomiting, diarrhea or constipation.  He denied having any headache or visual changes.  He is here today for evaluation before starting cycle #18 of his treatment.   MEDICAL HISTORY: Past Medical  History:  Diagnosis Date  . AAA (abdominal aortic aneurysm) ([place])   . AAA (abdominal aortic aneurysm) without rupture ([place]) 02/04/2014  . Cancer ([place])    lung  . Encounter for antineoplastic chemotherapy 06/06/2016  . History of hepatitis B   . HNP (herniated nucleus pulposus), lumbar    L4 with radiculopathy  . Hypertension   . Lung cancer ([place]) 05/18/2016  . Malignant neoplasm of lower lobe of left lung ([place]) 04/05/2016  . Mass of left lung   . Mitral insufficiency 04/06/2016   This patient will eventually need MV repair if his prognosis is good from oncology standpoint. However, his lung cancer therapy  is the priority right now. His cardiac condition won't preclude possible lung surgery.  Once his lung cancer is under control he will need a TEE to further evaluate his mitral valve anatomy and MR severity, and also have ischemic workup as tere is evidence on calcificati  . Renal insufficiency 10/09/2016  . S/P partial lobectomy of lung 05/11/2016  . Varicose veins of legs     ALLERGIES:  is allergic to tramadol.  MEDICATIONS:  Current Outpatient Medications  Medication Sig Dispense Refill  . folic acid (FOLVITE) 1 MG tablet TAKE 1 TABLET BY MOUTH EVERY DAY 90 tablet 1  . lisinopril (PRINIVIL,ZESTRIL) 2.5 MG tablet Take 1 tablet (2.5 mg total) by mouth daily. 30 tablet 11   No current facility-administered medications for this visit.     SURGICAL HISTORY:  Past Surgical History:  Procedure Laterality Date  . ABDOMINAL AORTIC ENDOVASCULAR STENT GRAFT N/A 03/12/2016   Procedure:  ABDOMINAL AORTIC ENDOVASCULAR STENT GRAFT;  Surgeon: Conrad Little Elm, MD;  Location: [facility];  Service: Vascular;  Laterality: N/A;  . COLONOSCOPY    . INGUINAL HERNIA REPAIR Left 1975   Left inguinal hernia  . INGUINAL HERNIA REPAIR Right   . LOBECTOMY Left 05/11/2016   Procedure: LEFT LOWER LOBE LOBECTOMY AND LEFT UPPER LOBE  RESECTION AND PLACEMENT OF ON-Q;  Surgeon: [name], MD;  Location: [facility];   Service: Thoracic;  Laterality: Left;  . LUMBAR LAMINECTOMY  October 22, 2012  . LYMPH NODE DISSECTION Left 05/11/2016   Procedure: LYMPH NODE DISSECTION;  Surgeon: [name], MD;  Location: [facility];  Service: Thoracic;  Laterality: Left;  . STAPLING OF BLEBS Left 05/11/2016   Procedure: STAPLING OF APICAL BLEB;  Surgeon: [name], MD;  Location: [facility];  Service: Thoracic;  Laterality: Left;  Marland Kitchen VIDEO ASSISTED THORACOSCOPY Left 05/18/2016   Procedure: VIDEO ASSISTED THORACOSCOPY WITH REMOVAL OF LEFT APICAL BLEB;  Surgeon: [name], MD;  Location: [facility];  Service: Thoracic;  Laterality: Left;  Marland Kitchen VIDEO ASSISTED THORACOSCOPY (VATS)/WEDGE RESECTION Left 05/11/2016   Procedure: LEFT VIDEO ASSISTED THORACOSCOPY (VATS);  Surgeon: [name], MD;  Location: [facility];  Service: Thoracic;  Laterality: Left;  Marland Kitchen VIDEO BRONCHOSCOPY N/A 05/11/2016   Procedure: VIDEO BRONCHOSCOPY, LEFT LUNG;  Surgeon: [name], MD;  Location: [facility];  Service: Thoracic;  Laterality: N/A;  . VIDEO BRONCHOSCOPY N/A 05/18/2016   Procedure: VIDEO BRONCHOSCOPY WITH BRONCHIAL WASHING;  Surgeon: [name], MD;  Location: [facility];  Service: Thoracic;  Laterality: N/A;    REVIEW OF SYSTEMS:  A comprehensive review of systems was negative except for: Constitutional: positive for fatigue   PHYSICAL EXAMINATION: General appearance: alert, cooperative, fatigued and no distress Head: Normocephalic, without obvious abnormality, atraumatic Neck: no adenopathy, no JVD, supple, symmetrical, trachea midline and thyroid not enlarged, symmetric, no tenderness/mass/nodules Lymph nodes: Cervical, supraclavicular, and axillary nodes normal. Resp: clear to auscultation bilaterally Back: symmetric, no curvature. ROM normal. No CVA tenderness. Cardio: regular rate and rhythm, S1, S2 normal, no murmur, click, rub or gallop GI: soft, non-tender; bowel sounds normal; no masses,  no organomegaly Extremities: extremities  normal, atraumatic, no cyanosis or edema   ECOG PERFORMANCE STATUS: 1 - Symptomatic but completely ambulatory  Blood pressure (!) 144/85, pulse 90, temperature 98.5 F (36.9 C), temperature source Oral, resp. rate 20, height '6\' 3"'  (1.905 m), weight 155 lb 11.2 oz (70.6 kg), SpO2 99 %.  LABORATORY DATA: Lab Results  Component Value Date   WBC 7.0 12/11/2018   HGB 13.2 12/11/2018   HCT 40.4 12/11/2018   MCV 102.0 (H) 12/11/2018   PLT 483 (H) 12/11/2018      Chemistry      Component Value Date/Time   NA 134 (L) 11/20/2018 1026   NA 138 01/11/2017 1343   K 4.4 11/20/2018 1026   K 4.7 01/11/2017 1343   CL 106 11/20/2018 1026   CO2 20 (L) 11/20/2018 1026   CO2 23 01/11/2017 1343   BUN 9 11/20/2018 1026   BUN 20.2 01/11/2017 1343   CREATININE 1.30 (H) 11/20/2018 1026   CREATININE 1.6 (H) 01/11/2017 1343      Component Value Date/Time   CALCIUM 8.8 (L) 11/20/2018 1026   CALCIUM 9.3 01/11/2017 1343   ALKPHOS 95 11/20/2018 1026   ALKPHOS 89 01/11/2017 1343   AST 38 11/20/2018 1026   AST 41 (H) 01/11/2017 1343  ALT 21 11/20/2018 1026   ALT 27 01/11/2017 1343   BILITOT 0.4 11/20/2018 1026   BILITOT 0.49 01/11/2017 1343       RADIOGRAPHIC STUDIES: No results found.  ASSESSMENT AND PLAN:  This is a very pleasant 63 years old white male with a stage IB non-small cell lung cancer, adenocarcinoma status post wedge resection of the left upper lobe followed by 4 cycles of adjuvant systemic chemotherapy with cisplatin and Alimta and he tolerated his treatment well except for fatigue. The patient has been on observation since June 2018.   The recent imaging studies including CT scan of the chest as well as a PET scan showed evidence for disease recurrence with metastatic disease presented with bilateral pulmonary nodules as well as destructive bone lesions at T4 vertebral body, biopsy proven to be metastatic adenocarcinoma. Molecular studies by foundation 1 showed no actionable  mutations.  PDL 1 expression was negative. The patient is currently undergoing treatment with carboplatin, Alimta and Ketruda (pembrolizumab) status post 17 cycles.  Starting from cycle #5 he is on maintenance treatment with Alimta and Keytruda every 3 weeks. The patient continues to tolerate this treatment well with no concerning adverse effect except for mild fatigue. I recommended for him to proceed with cycle #18 today as planned. I will see him back for follow-up visit in 3 weeks for evaluation before starting cycle #19. For hypertension he was advised to take his blood pressure medication as prescribed and to consult with his primary care physician for adjustment of his medication if needed. The patient was advised to call immediately if he has any concerning symptoms in the interval. The patient voices understanding of current disease status and treatment options and is in agreement with the current care plan. All questions were answered. The patient knows to call the clinic with any problems, questions or concerns. We can certainly see the patient much sooner if necessary.  Disclaimer: This note was dictated with voice recognition software. Similar sounding words can inadvertently be transcribed and may not be corrected upon review.

## 2018-12-11 NOTE — Telephone Encounter (Signed)
Added additional cycles per 8/20 los - pt to get an updated schedule next visit.

## 2019-01-01 ENCOUNTER — Other Ambulatory Visit: Payer: Self-pay

## 2019-01-01 ENCOUNTER — Inpatient Hospital Stay: Payer: No Typology Code available for payment source

## 2019-01-01 ENCOUNTER — Inpatient Hospital Stay: Payer: No Typology Code available for payment source | Attending: Internal Medicine | Admitting: Internal Medicine

## 2019-01-01 ENCOUNTER — Encounter: Payer: Self-pay | Admitting: Internal Medicine

## 2019-01-01 VITALS — BP 158/90 | HR 89 | Temp 98.7°F | Resp 20 | Ht 75.0 in | Wt 160.2 lb

## 2019-01-01 DIAGNOSIS — R5383 Other fatigue: Secondary | ICD-10-CM | POA: Diagnosis not present

## 2019-01-01 DIAGNOSIS — C3490 Malignant neoplasm of unspecified part of unspecified bronchus or lung: Secondary | ICD-10-CM

## 2019-01-01 DIAGNOSIS — Z5112 Encounter for antineoplastic immunotherapy: Secondary | ICD-10-CM

## 2019-01-01 DIAGNOSIS — Z79899 Other long term (current) drug therapy: Secondary | ICD-10-CM | POA: Diagnosis not present

## 2019-01-01 DIAGNOSIS — I10 Essential (primary) hypertension: Secondary | ICD-10-CM

## 2019-01-01 DIAGNOSIS — M7989 Other specified soft tissue disorders: Secondary | ICD-10-CM | POA: Diagnosis not present

## 2019-01-01 DIAGNOSIS — Z5111 Encounter for antineoplastic chemotherapy: Secondary | ICD-10-CM | POA: Insufficient documentation

## 2019-01-01 DIAGNOSIS — C3492 Malignant neoplasm of unspecified part of left bronchus or lung: Secondary | ICD-10-CM

## 2019-01-01 DIAGNOSIS — C3432 Malignant neoplasm of lower lobe, left bronchus or lung: Secondary | ICD-10-CM | POA: Insufficient documentation

## 2019-01-01 LAB — CMP (CANCER CENTER ONLY)
ALT: 17 U/L (ref 0–44)
AST: 34 U/L (ref 15–41)
Albumin: 3.4 g/dL — ABNORMAL LOW (ref 3.5–5.0)
Alkaline Phosphatase: 86 U/L (ref 38–126)
Anion gap: 8 (ref 5–15)
BUN: 9 mg/dL (ref 8–23)
CO2: 23 mmol/L (ref 22–32)
Calcium: 8.8 mg/dL — ABNORMAL LOW (ref 8.9–10.3)
Chloride: 106 mmol/L (ref 98–111)
Creatinine: 1.14 mg/dL (ref 0.61–1.24)
GFR, Est AFR Am: >60 mL/min (ref >60)
GFR, Estimated: >60 mL/min (ref >60)
Glucose, Bld: 90 mg/dL (ref 70–99)
Potassium: 3.9 mmol/L (ref 3.5–5.1)
Sodium: 137 mmol/L (ref 135–145)
Total Bilirubin: 0.4 mg/dL (ref 0.3–1.2)
Total Protein: 6.7 g/dL (ref 6.5–8.1)

## 2019-01-01 LAB — CBC WITH DIFFERENTIAL (CANCER CENTER ONLY)
Abs Immature Granulocytes: 0.04 10*3/uL (ref 0.00–0.07)
Basophils Absolute: 0.1 10*3/uL (ref 0.0–0.1)
Basophils Relative: 1 %
Eosinophils Absolute: 0.1 10*3/uL (ref 0.0–0.5)
Eosinophils Relative: 2 %
HCT: 40.7 % (ref 39.0–52.0)
Hemoglobin: 13.3 g/dL (ref 13.0–17.0)
Immature Granulocytes: 1 %
Lymphocytes Relative: 14 %
Lymphs Abs: 1.1 10*3/uL (ref 0.7–4.0)
MCH: 33.3 pg (ref 26.0–34.0)
MCHC: 32.7 g/dL (ref 30.0–36.0)
MCV: 102 fL — ABNORMAL HIGH (ref 80.0–100.0)
Monocytes Absolute: 1.2 10*3/uL — ABNORMAL HIGH (ref 0.1–1.0)
Monocytes Relative: 15 %
Neutro Abs: 5.1 10*3/uL (ref 1.7–7.7)
Neutrophils Relative %: 67 %
Platelet Count: 301 10*3/uL (ref 150–400)
RBC: 3.99 MIL/uL — ABNORMAL LOW (ref 4.22–5.81)
RDW: 14.8 % (ref 11.5–15.5)
WBC Count: 7.6 10*3/uL (ref 4.0–10.5)
nRBC: 0 % (ref 0.0–0.2)

## 2019-01-01 MED ORDER — DEXAMETHASONE SODIUM PHOSPHATE 10 MG/ML IJ SOLN
INTRAMUSCULAR | Status: AC
  Filled 2019-01-01: qty 1

## 2019-01-01 MED ORDER — ONDANSETRON HCL 4 MG/2ML IJ SOLN
INTRAMUSCULAR | Status: AC
  Filled 2019-01-01: qty 4

## 2019-01-01 MED ORDER — ONDANSETRON HCL 4 MG/2ML IJ SOLN
8.0000 mg | Freq: Once | INTRAMUSCULAR | Status: AC
  Administered 2019-01-01: 8 mg via INTRAVENOUS

## 2019-01-01 MED ORDER — SODIUM CHLORIDE 0.9 % IV SOLN
520.0000 mg/m2 | Freq: Once | INTRAVENOUS | Status: AC
  Administered 2019-01-01: 1000 mg via INTRAVENOUS
  Filled 2019-01-01: qty 40

## 2019-01-01 MED ORDER — SODIUM CHLORIDE 0.9 % IV SOLN
200.0000 mg | Freq: Once | INTRAVENOUS | Status: AC
  Administered 2019-01-01: 200 mg via INTRAVENOUS
  Filled 2019-01-01: qty 8

## 2019-01-01 MED ORDER — SODIUM CHLORIDE 0.9 % IV SOLN
Freq: Once | INTRAVENOUS | Status: AC
  Administered 2019-01-01: 13:00:00 via INTRAVENOUS
  Filled 2019-01-01: qty 250

## 2019-01-01 MED ORDER — DEXAMETHASONE SODIUM PHOSPHATE 10 MG/ML IJ SOLN
10.0000 mg | Freq: Once | INTRAMUSCULAR | Status: AC
  Administered 2019-01-01: 10 mg via INTRAVENOUS

## 2019-01-01 NOTE — Patient Instructions (Signed)
[facility] Discharge Instructions for Patients Receiving Chemotherapy  Today you received the following chemotherapy agents: Keytruda and Alimta.  To help prevent nausea and vomiting after your treatment, we encourage you to take your nausea medication as directed.   If you develop nausea and vomiting that is not controlled by your nausea medication, call the clinic.   BELOW ARE SYMPTOMS THAT SHOULD BE REPORTED IMMEDIATELY:  *FEVER GREATER THAN 100.5 F  *CHILLS WITH OR WITHOUT FEVER  NAUSEA AND VOMITING THAT IS NOT CONTROLLED WITH YOUR NAUSEA MEDICATION  *UNUSUAL SHORTNESS OF BREATH  *UNUSUAL BRUISING OR BLEEDING  TENDERNESS IN MOUTH AND THROAT WITH OR WITHOUT PRESENCE OF ULCERS  *URINARY PROBLEMS  *BOWEL PROBLEMS  UNUSUAL RASH Items with * indicate a potential emergency and should be followed up as soon as possible.  Feel free to call the clinic should you have any questions or concerns. The clinic phone number is [phone].  Please show the [facility] at check-in to the Emergency Department and triage nurse.

## 2019-01-01 NOTE — Progress Notes (Signed)
[facility] Telephone:(336) [phone]   Fax:(336) [phone]  OFFICE PROGRESS NOTE  Patient, No Pcp Per No address on file  DIAGNOSIS: Metastatic non-small cell lung cancer, adenocarcinoma initially diagnosed as stage IB (T2a, N0, M0) non-small cell lung cancer, adenocarcinoma diagnosed in December 2017.  The patient has evidence for disease recurrence in July 2019.  Biomarker Findings Tumor Mutational Burden - TMB-Intermediate (16 Muts/Mb) Microsatellite status - MS-Stable Genomic Findings For a complete list of the genes assayed, please refer to the Appendix. KIT amplification KRAS Q22W SMAD4 splice site [id number]>[id number] JH41 I232F 7 Disease relevant genes with no reportable alterations: EGFR, ALK, BRAF, MET, RET, ERBB2, ROS1   PDL 1 expression is 0%  PRIOR THERAPY:  1) status post left lower lobectomy as well as wedge resection of the left upper lobe on 05/11/2016. 2) Adjuvant systemic chemotherapy with cisplatin 75 MG/M2 and Alimta 500 MG/M2 every 3 weeks. First dose 07/03/2016. Status post 4 cycles.  CURRENT THERAPY: Systemic chemotherapy with carboplatin for AUC of 5, Alimta 500 mg/M2 and Keytruda 200 mg IV every 3 weeks.  Status post 18 cycles.  Starting from cycle #5 the patient will be treated with maintenance Alimta and Ketruda (pembrolizumab) every 3 weeks.  INTERVAL HISTORY: [name] 63 y.o. male returns to the clinic today for follow-up visit.  The patient is feeling fine today with no concerning complaints except for mild fatigue and mild swelling of the ankles.  He denied having any current chest pain, shortness of breath, cough or hemoptysis.  He denied having any fever or chills.  He has no nausea, vomiting, diarrhea or constipation.  He denied having any headache or visual changes.  He has been tolerating his maintenance treatment with Alimta and Keytruda fairly well.  The patient is here today for evaluation before starting cycle #19.    MEDICAL HISTORY: Past Medical History:  Diagnosis Date  . AAA (abdominal aortic aneurysm) ([place])   . AAA (abdominal aortic aneurysm) without rupture ([place]) 02/04/2014  . Cancer ([place])    lung  . Encounter for antineoplastic chemotherapy 06/06/2016  . History of hepatitis B   . HNP (herniated nucleus pulposus), lumbar    L4 with radiculopathy  . Hypertension   . Lung cancer ([place]) 05/18/2016  . Malignant neoplasm of lower lobe of left lung ([place]) 04/05/2016  . Mass of left lung   . Mitral insufficiency 04/06/2016   This patient will eventually need MV repair if his prognosis is good from oncology standpoint. However, his lung cancer therapy  is the priority right now. His cardiac condition won't preclude possible lung surgery.  Once his lung cancer is under control he will need a TEE to further evaluate his mitral valve anatomy and MR severity, and also have ischemic workup as tere is evidence on calcificati  . Renal insufficiency 10/09/2016  . S/P partial lobectomy of lung 05/11/2016  . Varicose veins of legs     ALLERGIES:  is allergic to tramadol.  MEDICATIONS:  Current Outpatient Medications  Medication Sig Dispense Refill  . folic acid (FOLVITE) 1 MG tablet TAKE 1 TABLET BY MOUTH EVERY DAY 90 tablet 1  . lisinopril (PRINIVIL,ZESTRIL) 2.5 MG tablet Take 1 tablet (2.5 mg total) by mouth daily. 30 tablet 11   No current facility-administered medications for this visit.     SURGICAL HISTORY:  Past Surgical History:  Procedure Laterality Date  . ABDOMINAL AORTIC ENDOVASCULAR STENT GRAFT N/A 03/12/2016  Procedure: ABDOMINAL AORTIC ENDOVASCULAR STENT GRAFT;  Surgeon: Conrad [place], MD;  Location: [facility];  Service: Vascular;  Laterality: N/A;  . COLONOSCOPY    . INGUINAL HERNIA REPAIR Left 1975   Left inguinal hernia  . INGUINAL HERNIA REPAIR Right   . LOBECTOMY Left 05/11/2016   Procedure: LEFT LOWER LOBE LOBECTOMY AND LEFT UPPER LOBE  RESECTION AND PLACEMENT OF ON-Q;  Surgeon: [name], MD;  Location: [facility];  Service: Thoracic;  Laterality: Left;  . LUMBAR LAMINECTOMY  October 22, 2012  . LYMPH NODE DISSECTION Left 05/11/2016   Procedure: LYMPH NODE DISSECTION;  Surgeon: [name], MD;  Location: [facility];  Service: Thoracic;  Laterality: Left;  . STAPLING OF BLEBS Left 05/11/2016   Procedure: STAPLING OF APICAL BLEB;  Surgeon: [name], MD;  Location: [facility];  Service: Thoracic;  Laterality: Left;  Marland Kitchen VIDEO ASSISTED THORACOSCOPY Left 05/18/2016   Procedure: VIDEO ASSISTED THORACOSCOPY WITH REMOVAL OF LEFT APICAL BLEB;  Surgeon: [name], MD;  Location: [facility];  Service: Thoracic;  Laterality: Left;  Marland Kitchen VIDEO ASSISTED THORACOSCOPY (VATS)/WEDGE RESECTION Left 05/11/2016   Procedure: LEFT VIDEO ASSISTED THORACOSCOPY (VATS);  Surgeon: [name], MD;  Location: [facility];  Service: Thoracic;  Laterality: Left;  Marland Kitchen VIDEO BRONCHOSCOPY N/A 05/11/2016   Procedure: VIDEO BRONCHOSCOPY, LEFT LUNG;  Surgeon: [name], MD;  Location: [facility];  Service: Thoracic;  Laterality: N/A;  . VIDEO BRONCHOSCOPY N/A 05/18/2016   Procedure: VIDEO BRONCHOSCOPY WITH BRONCHIAL WASHING;  Surgeon: [name], MD;  Location: [facility];  Service: Thoracic;  Laterality: N/A;    REVIEW OF SYSTEMS:  A comprehensive review of systems was negative except for: Constitutional: positive for fatigue   PHYSICAL EXAMINATION: General appearance: alert, cooperative, fatigued and no distress Head: Normocephalic, without obvious abnormality, atraumatic Neck: no adenopathy, no JVD, supple, symmetrical, trachea midline and thyroid not enlarged, symmetric, no tenderness/mass/nodules Lymph nodes: Cervical, supraclavicular, and axillary nodes normal. Resp: clear to auscultation bilaterally Back: symmetric, no curvature. ROM normal. No CVA tenderness. Cardio: regular rate and rhythm, S1, S2 normal, no murmur, click, rub or gallop GI: soft, non-tender; bowel sounds normal; no masses,  no  organomegaly Extremities: edema Trace edema bilaterally.   ECOG PERFORMANCE STATUS: 1 - Symptomatic but completely ambulatory  Blood pressure (!) 158/90, pulse 89, temperature 98.7 F (37.1 C), temperature source Oral, resp. rate 20, height '6\' 3"'  (1.905 m), weight 160 lb 3.2 oz (72.7 kg), SpO2 98 %.  LABORATORY DATA: Lab Results  Component Value Date   WBC 7.6 01/01/2019   HGB 13.3 01/01/2019   HCT 40.7 01/01/2019   MCV 102.0 (H) 01/01/2019   PLT 301 01/01/2019      Chemistry      Component Value Date/Time   NA 133 (L) 12/11/2018 1146   NA 138 01/11/2017 1343   K 3.8 12/11/2018 1146   K 4.7 01/11/2017 1343   CL 105 12/11/2018 1146   CO2 17 (L) 12/11/2018 1146   CO2 23 01/11/2017 1343   BUN 9 12/11/2018 1146   BUN 20.2 01/11/2017 1343   CREATININE 1.25 (H) 12/11/2018 1146   CREATININE 1.6 (H) 01/11/2017 1343      Component Value Date/Time   CALCIUM 8.6 (L) 12/11/2018 1146   CALCIUM 9.3 01/11/2017 1343   ALKPHOS 100 12/11/2018 1146   ALKPHOS 89 01/11/2017 1343   AST 31 12/11/2018 1146   AST 41 (H) 01/11/2017 1343   ALT 16 12/11/2018  1146   ALT 27 01/11/2017 1343   BILITOT 0.3 12/11/2018 1146   BILITOT 0.49 01/11/2017 1343       RADIOGRAPHIC STUDIES: No results found.  ASSESSMENT AND PLAN:  This is a very pleasant 63 years old white male with a stage IB non-small cell lung cancer, adenocarcinoma status post wedge resection of the left upper lobe followed by 4 cycles of adjuvant systemic chemotherapy with cisplatin and Alimta and he tolerated his treatment well except for fatigue. The patient has been on observation since June 2018.   The recent imaging studies including CT scan of the chest as well as a PET scan showed evidence for disease recurrence with metastatic disease presented with bilateral pulmonary nodules as well as destructive bone lesions at T4 vertebral body, biopsy proven to be metastatic adenocarcinoma. Molecular studies by foundation 1 showed no  actionable mutations.  PDL 1 expression was negative. The patient is currently undergoing treatment with carboplatin, Alimta and Ketruda (pembrolizumab) status post 18 cycles.  Starting from cycle #5 he is on maintenance treatment with Alimta and Keytruda every 3 weeks. The patient continues to tolerate his treatment well with no concerning adverse effects. I recommended for him to proceed with cycle #19 today as planned. For the hypertension he will continue with the current blood pressure medication. For the trace edema of the lower extremities, I advised the patient to decrease the salt intake and also elevate his leg when he lays down. I will see him back for follow-up visit in 3 weeks for evaluation with repeat CT scan of the chest, abdomen and pelvis for restaging of his disease before the next cycle of his treatment. He was advised to call immediately if he has any concerning symptoms in the interval. The patient voices understanding of current disease status and treatment options and is in agreement with the current care plan. All questions were answered. The patient knows to call the clinic with any problems, questions or concerns. We can certainly see the patient much sooner if necessary.  Disclaimer: This note was dictated with voice recognition software. Similar sounding words can inadvertently be transcribed and may not be corrected upon review.

## 2019-01-05 ENCOUNTER — Telehealth: Payer: Self-pay | Admitting: Internal Medicine

## 2019-01-05 NOTE — Telephone Encounter (Signed)
Patient already on schedule as requested per 9/10 los thru end of October. Central radiology will call re scan.

## 2019-01-19 ENCOUNTER — Other Ambulatory Visit: Payer: Self-pay | Admitting: Internal Medicine

## 2019-01-20 ENCOUNTER — Other Ambulatory Visit: Payer: Self-pay

## 2019-01-20 ENCOUNTER — Encounter (HOSPITAL_COMMUNITY): Payer: Self-pay

## 2019-01-20 ENCOUNTER — Ambulatory Visit (HOSPITAL_COMMUNITY)
Admission: RE | Admit: 2019-01-20 | Discharge: 2019-01-20 | Disposition: A | Discharge status: Home / Self Care | Payer: No Typology Code available for payment source | Source: Ambulatory Visit | Attending: Internal Medicine | Admitting: Internal Medicine

## 2019-01-20 DIAGNOSIS — C3490 Malignant neoplasm of unspecified part of unspecified bronchus or lung: Secondary | ICD-10-CM | POA: Insufficient documentation

## 2019-01-20 MED ORDER — SODIUM CHLORIDE (PF) 0.9 % IJ SOLN
INTRAMUSCULAR | Status: AC
  Filled 2019-01-20: qty 50

## 2019-01-20 MED ORDER — IOHEXOL 300 MG/ML  SOLN
100.0000 mL | Freq: Once | INTRAMUSCULAR | Status: AC | PRN
  Administered 2019-01-20: 100 mL via INTRAVENOUS

## 2019-01-21 ENCOUNTER — Other Ambulatory Visit: Payer: Self-pay | Admitting: Medical Oncology

## 2019-01-21 DIAGNOSIS — C3492 Malignant neoplasm of unspecified part of left bronchus or lung: Secondary | ICD-10-CM

## 2019-01-22 ENCOUNTER — Inpatient Hospital Stay: Payer: No Typology Code available for payment source | Attending: Internal Medicine

## 2019-01-22 ENCOUNTER — Other Ambulatory Visit: Payer: Self-pay

## 2019-01-22 ENCOUNTER — Encounter: Payer: Self-pay | Admitting: Internal Medicine

## 2019-01-22 ENCOUNTER — Inpatient Hospital Stay: Payer: No Typology Code available for payment source

## 2019-01-22 ENCOUNTER — Inpatient Hospital Stay (HOSPITAL_BASED_OUTPATIENT_CLINIC_OR_DEPARTMENT_OTHER): Payer: No Typology Code available for payment source | Admitting: Internal Medicine

## 2019-01-22 VITALS — HR 73

## 2019-01-22 VITALS — BP 142/93 | HR 101 | Temp 98.0°F | Resp 18 | Ht 75.0 in | Wt 161.5 lb

## 2019-01-22 DIAGNOSIS — Z79899 Other long term (current) drug therapy: Secondary | ICD-10-CM | POA: Diagnosis not present

## 2019-01-22 DIAGNOSIS — Z5111 Encounter for antineoplastic chemotherapy: Secondary | ICD-10-CM | POA: Diagnosis present

## 2019-01-22 DIAGNOSIS — C3492 Malignant neoplasm of unspecified part of left bronchus or lung: Secondary | ICD-10-CM

## 2019-01-22 DIAGNOSIS — C3432 Malignant neoplasm of lower lobe, left bronchus or lung: Secondary | ICD-10-CM | POA: Diagnosis present

## 2019-01-22 DIAGNOSIS — Z5112 Encounter for antineoplastic immunotherapy: Secondary | ICD-10-CM

## 2019-01-22 DIAGNOSIS — I10 Essential (primary) hypertension: Secondary | ICD-10-CM | POA: Diagnosis not present

## 2019-01-22 LAB — CBC WITH DIFFERENTIAL (CANCER CENTER ONLY)
Abs Immature Granulocytes: 0.01 10*3/uL (ref 0.00–0.07)
Basophils Absolute: 0.1 10*3/uL (ref 0.0–0.1)
Basophils Relative: 1 %
Eosinophils Absolute: 0.2 10*3/uL (ref 0.0–0.5)
Eosinophils Relative: 3 %
HCT: 40.2 % (ref 39.0–52.0)
Hemoglobin: 13.2 g/dL (ref 13.0–17.0)
Immature Granulocytes: 0 %
Lymphocytes Relative: 20 %
Lymphs Abs: 1.3 10*3/uL (ref 0.7–4.0)
MCH: 33.3 pg (ref 26.0–34.0)
MCHC: 32.8 g/dL (ref 30.0–36.0)
MCV: 101.5 fL — ABNORMAL HIGH (ref 80.0–100.0)
Monocytes Absolute: 1.1 10*3/uL — ABNORMAL HIGH (ref 0.1–1.0)
Monocytes Relative: 17 %
Neutro Abs: 3.7 10*3/uL (ref 1.7–7.7)
Neutrophils Relative %: 59 %
Platelet Count: 397 10*3/uL (ref 150–400)
RBC: 3.96 MIL/uL — ABNORMAL LOW (ref 4.22–5.81)
RDW: 14.6 % (ref 11.5–15.5)
WBC Count: 6.3 10*3/uL (ref 4.0–10.5)
nRBC: 0 % (ref 0.0–0.2)

## 2019-01-22 LAB — CMP (CANCER CENTER ONLY)
ALT: 18 U/L (ref 0–44)
AST: 35 U/L (ref 15–41)
Albumin: 3.4 g/dL — ABNORMAL LOW (ref 3.5–5.0)
Alkaline Phosphatase: 84 U/L (ref 38–126)
Anion gap: 8 (ref 5–15)
BUN: 8 mg/dL (ref 8–23)
CO2: 21 mmol/L — ABNORMAL LOW (ref 22–32)
Calcium: 8.6 mg/dL — ABNORMAL LOW (ref 8.9–10.3)
Chloride: 105 mmol/L (ref 98–111)
Creatinine: 1.2 mg/dL (ref 0.61–1.24)
GFR, Est AFR Am: >60 mL/min (ref >60)
GFR, Estimated: >60 mL/min (ref >60)
Glucose, Bld: 125 mg/dL — ABNORMAL HIGH (ref 70–99)
Potassium: 3.7 mmol/L (ref 3.5–5.1)
Sodium: 134 mmol/L — ABNORMAL LOW (ref 135–145)
Total Bilirubin: 0.5 mg/dL (ref 0.3–1.2)
Total Protein: 6.6 g/dL (ref 6.5–8.1)

## 2019-01-22 LAB — TSH: TSH: 2.068 u[IU]/mL (ref 0.320–4.118)

## 2019-01-22 MED ORDER — SODIUM CHLORIDE 0.9 % IV SOLN
520.0000 mg/m2 | Freq: Once | INTRAVENOUS | Status: AC
  Administered 2019-01-22: 1000 mg via INTRAVENOUS
  Filled 2019-01-22: qty 40

## 2019-01-22 MED ORDER — ONDANSETRON HCL 4 MG/2ML IJ SOLN
8.0000 mg | Freq: Once | INTRAMUSCULAR | Status: AC
  Administered 2019-01-22: 8 mg via INTRAVENOUS

## 2019-01-22 MED ORDER — SODIUM CHLORIDE 0.9 % IV SOLN
200.0000 mg | Freq: Once | INTRAVENOUS | Status: AC
  Administered 2019-01-22: 200 mg via INTRAVENOUS
  Filled 2019-01-22: qty 8

## 2019-01-22 MED ORDER — DEXAMETHASONE SODIUM PHOSPHATE 10 MG/ML IJ SOLN
INTRAMUSCULAR | Status: AC
  Filled 2019-01-22: qty 1

## 2019-01-22 MED ORDER — DEXAMETHASONE SODIUM PHOSPHATE 10 MG/ML IJ SOLN
10.0000 mg | Freq: Once | INTRAMUSCULAR | Status: AC
  Administered 2019-01-22: 10 mg via INTRAVENOUS

## 2019-01-22 MED ORDER — SODIUM CHLORIDE 0.9 % IV SOLN
Freq: Once | INTRAVENOUS | Status: AC
  Administered 2019-01-22: 12:00:00 via INTRAVENOUS
  Filled 2019-01-22: qty 250

## 2019-01-22 MED ORDER — CYANOCOBALAMIN 1000 MCG/ML IJ SOLN: 1000.0000 ug | Freq: Once | INTRAMUSCULAR | Status: DC

## 2019-01-22 MED ORDER — CYANOCOBALAMIN 1000 MCG/ML IJ SOLN
INTRAMUSCULAR | Status: AC
  Filled 2019-01-22: qty 1

## 2019-01-22 MED ORDER — ONDANSETRON HCL 4 MG/2ML IJ SOLN
INTRAMUSCULAR | Status: AC
  Filled 2019-01-22: qty 4

## 2019-01-22 NOTE — Progress Notes (Signed)
[facility] [phone]   [phone]  OFFICE PROGRESS NOTE  Patient, No Pcp Per No address on file  DIAGNOSIS: Metastatic non-small cell lung cancer, adenocarcinoma initially diagnosed as stage IB (T2a, N0, M0) non-small cell lung cancer, adenocarcinoma diagnosed in December 2017.  The patient has evidence for disease recurrence in July 2019.  Biomarker Findings Tumor Mutational Burden - TMB-Intermediate (16 Muts/Mb) Microsatellite status - MS-Stable Genomic Findings For a complete list of the genes assayed, please refer to the Appendix. KIT amplification KRAS R83E SMAD4 splice site [id number] SU11 I232F 7 Disease relevant genes with no reportable alterations: EGFR, ALK, BRAF, MET, RET, ERBB2, ROS1   PDL 1 expression is 0%  PRIOR THERAPY:  1) status post left lower lobectomy as well as wedge resection of the left upper lobe on 05/11/2016. 2) Adjuvant systemic chemotherapy with cisplatin 75 MG/M2 and Alimta 500 MG/M2 every 3 weeks. First dose 07/03/2016. Status post 4 cycles.  CURRENT THERAPY: Systemic chemotherapy with carboplatin for AUC of 5, Alimta 500 mg/M2 and Keytruda 200 mg IV every 3 weeks.  Status post 19 cycles.  Starting from cycle #5 the patient will be treated with maintenance Alimta and Ketruda (pembrolizumab) every 3 weeks.  INTERVAL HISTORY: William Kales [name] 63 y.o. male returns to the clinic today for follow-up visit.  The patient is feeling fine today with no concerning complaints except for mild fatigue.  He has been tolerating his systemic chemotherapy fairly well.  He denied having any chest pain, shortness of breath, cough or hemoptysis.  He denied having any fever or chills.  He has no nausea, vomiting, diarrhea or constipation.  He has no headache or visual changes.  The patient denied having any weight loss or night sweats.  He had repeat CT scan of the chest, abdomen pelvis performed recently and he is here for  evaluation and discussion of his scan results.  MEDICAL HISTORY: Past Medical History:  Diagnosis Date   AAA (abdominal aortic aneurysm) ([place])    AAA (abdominal aortic aneurysm) without rupture ([place]) 02/04/2014   Cancer ([place])    lung   Encounter for antineoplastic chemotherapy 06/06/2016   History of hepatitis B    HNP (herniated nucleus pulposus), lumbar    L4 with radiculopathy   Hypertension    Lung cancer ([address]) 05/18/2016   Malignant neoplasm of lower lobe of left lung ([place]) 04/05/2016   Mass of left lung    Mitral insufficiency 04/06/2016   This patient will eventually need MV repair if his prognosis is good from oncology standpoint. However, his lung cancer therapy  is the priority right now. His cardiac condition won't preclude possible lung surgery.  Once his lung cancer is under control he will need a TEE to further evaluate his mitral valve anatomy and MR severity, and also have ischemic workup as tere is evidence on calcificati   Renal insufficiency 10/09/2016   S/P partial lobectomy of lung 05/11/2016   Varicose veins of legs     ALLERGIES:  is allergic to tramadol.  MEDICATIONS:  Current Outpatient Medications  Medication Sig Dispense Refill   folic acid (FOLVITE) 1 MG tablet TAKE 1 TABLET BY MOUTH EVERY DAY 90 tablet 1   lisinopril (PRINIVIL,ZESTRIL) 2.5 MG tablet Take 1 tablet (2.5 mg total) by mouth daily. 30 tablet 11   No current facility-administered medications for this visit.     SURGICAL HISTORY:  Past Surgical History:  Procedure Laterality  Date   ABDOMINAL AORTIC ENDOVASCULAR STENT GRAFT N/A 03/12/2016   Procedure: ABDOMINAL AORTIC ENDOVASCULAR STENT GRAFT;  Surgeon: Conrad Foxfire, MD;  Location: [facility];  Service: Vascular;  Laterality: N/A;   COLONOSCOPY     INGUINAL HERNIA REPAIR Left 1975   Left inguinal hernia   INGUINAL HERNIA REPAIR Right    LOBECTOMY Left 05/11/2016   Procedure: LEFT LOWER LOBE LOBECTOMY AND LEFT UPPER LOBE   RESECTION AND PLACEMENT OF ON-Q;  Surgeon: [name], MD;  Location: [facility];  Service: Thoracic;  Laterality: Left;   LUMBAR LAMINECTOMY  October 22, 2012   LYMPH NODE DISSECTION Left 05/11/2016   Procedure: LYMPH NODE DISSECTION;  Surgeon: [name], MD;  Location: [facility];  Service: Thoracic;  Laterality: Left;   STAPLING OF BLEBS Left 05/11/2016   Procedure: STAPLING OF APICAL BLEB;  Surgeon: [name], MD;  Location[facility];  Service: Thoracic;  Laterality: Left;   VIDEO ASSISTED THORACOSCOPY Left 05/18/2016   Procedure: VIDEO ASSISTED THORACOSCOPY WITH REMOVAL OF LEFT APICAL BLEB;  Surgeon: [name], MD;  Location: [facility];  Service: Thoracic;  Laterality: Left;   VIDEO ASSISTED THORACOSCOPY (VATS)/WEDGE RESECTION Left 05/11/2016   Procedure: LEFT VIDEO ASSISTED THORACOSCOPY (VATS);  Surgeon: [name], MD;  Location: [facility];  Service: Thoracic;  Laterality: Left;   VIDEO BRONCHOSCOPY N/A 05/11/2016   Procedure: VIDEO BRONCHOSCOPY, LEFT LUNG;  Surgeon: [name], MD;  Location: [facility];  Service: Thoracic;  Laterality: N/A;   VIDEO BRONCHOSCOPY N/A 05/18/2016   Procedure: VIDEO BRONCHOSCOPY WITH BRONCHIAL WASHING;  Surgeon: [name], MD;  Location: [facility];  Service: Thoracic;  Laterality: N/A;    REVIEW OF SYSTEMS:  Constitutional: positive for fatigue Eyes: negative Ears, nose, mouth, throat, and face: negative Respiratory: negative Cardiovascular: negative Gastrointestinal: negative Genitourinary:negative Integument/breast: negative Hematologic/lymphatic: negative Musculoskeletal:negative Neurological: negative Behavioral/Psych: negative Endocrine: negative Allergic/Immunologic: negative   PHYSICAL EXAMINATION: General appearance: alert, cooperative, fatigued and no distress Head: Normocephalic, without obvious abnormality, atraumatic Neck: no adenopathy, no JVD, supple, symmetrical, trachea midline and thyroid not enlarged,  symmetric, no tenderness/mass/nodules Lymph nodes: Cervical, supraclavicular, and axillary nodes normal. Resp: clear to auscultation bilaterally Back: symmetric, no curvature. ROM normal. No CVA tenderness. Cardio: regular rate and rhythm, S1, S2 normal, no murmur, click, rub or gallop GI: soft, non-tender; bowel sounds normal; no masses,  no organomegaly Extremities: extremities normal, atraumatic, no cyanosis or edema Neurologic: Alert and oriented X 3, normal strength and tone. Normal symmetric reflexes. Normal coordination and gait   ECOG PERFORMANCE STATUS: 1 - Symptomatic but completely ambulatory  Blood pressure (!) 142/93, pulse (!) 101, temperature 98 F (36.7 C), temperature source Temporal, resp. rate 18, height _0  (1.905 m), weight 161 lb 8 oz (73.3 kg), SpO2 98 %.  LABORATORY DATA: Lab Results  Component Value Date   WBC 6.3 01/22/2019   HGB 13.2 01/22/2019   HCT 40.2 01/22/2019   MCV 101.5 (H) 01/22/2019   PLT 397 01/22/2019      Chemistry      Component Value Date/Time   NA 137 01/01/2019 1151   NA 138 01/11/2017 1343   K 3.9 01/01/2019 1151   K 4.7 01/11/2017 1343   CL 106 01/01/2019 1151   CO2 23 01/01/2019 1151   CO2 23 01/11/2017 1343   BUN 9 01/01/2019 1151   BUN 20.2 01/11/2017 1343   CREATININE 1.14 01/01/2019 1151   CREATININE 1.6 (H) 01/11/2017 1343  Component Value Date/Time   CALCIUM 8.8 (L) 01/01/2019 1151   CALCIUM 9.3 01/11/2017 1343   ALKPHOS 86 01/01/2019 1151   ALKPHOS 89 01/11/2017 1343   AST 34 01/01/2019 1151   AST 41 (H) 01/11/2017 1343   ALT 17 01/01/2019 1151   ALT 27 01/11/2017 1343   BILITOT 0.4 01/01/2019 1151   BILITOT 0.49 01/11/2017 1343       RADIOGRAPHIC STUDIES: Ct Chest W Contrast  Result Date: 01/20/2019 CLINICAL DATA:  Follow-up lung cancer. EXAM: CT CHEST, ABDOMEN, AND PELVIS WITH CONTRAST TECHNIQUE: Multidetector CT imaging of the chest, abdomen and pelvis was performed following the standard protocol  during bolus administration of intravenous contrast. CONTRAST:  120m OMNIPAQUE IOHEXOL 300 MG/ML  SOLN COMPARISON:  05/07/2018 FINDINGS: CT CHEST FINDINGS Cardiovascular: The heart size appears normal. No pericardial effusion. Aortic atherosclerosis. Mediastinum/Nodes: Left lobe of thyroid gland nodule measures 2.3 cm, image 8/2. Small enhancing nodule or lymph node anterior to the proximal arch is unchanged measuring 1.7 cm, image 37/2. No axillary or supraclavicular adenopathy. No hilar or middle or posterior mediastinal adenopathy. Lungs/Pleura: Small bilateral pleural effusions are identified right greater than left. Slightly increased from previous exam. Calcified pleural plaque overlying the posterior and medial right lung is again noted. Advanced centrilobular and paraseptal emphysema. Status post left upper lobectomy. Pulmonary nodule within the lateral left lower lobe measures 9 mm, new from previous exam, image 117/6. Stable 4 mm anterior right upper lobe lung nodule, image 80/6. Musculoskeletal: No chest wall mass or suspicious bone lesions identified. CT ABDOMEN PELVIS FINDINGS Hepatobiliary: No focal liver abnormality is seen. No gallstones, gallbladder wall thickening, or biliary dilatation. Pancreas: Unremarkable. No pancreatic ductal dilatation or surrounding inflammatory changes. Spleen: Normal in size without focal abnormality. Adrenals/Urinary Tract: Normal appearance of the adrenal glands. Small bilateral low-density kidney lesions are noted, too small to characterize. The urinary bladder appears normal. Stomach/Bowel: Stomach is within normal limits. Appendix appears normal. No evidence of bowel wall thickening, distention, or inflammatory changes. Vascular/Lymphatic: Status post stent graft repair of infrarenal abdominal aortic aneurysm. No abdominopelvic adenopathy identified. Reproductive: Prostate gland is enlarged. Other: No ascites or evidence of peritoneal disease. Musculoskeletal: No  acute or significant osseous findings. IMPRESSION: 1. New, suspicious nodule in the lateral left lower lobe measuring 9 mm. Cannot rule out focus of metastatic disease or new lesion. 2. Aortic Atherosclerosis (ICD10-I70.0) and Emphysema (ICD10-J43.9). 3. Left lobe of thyroid gland nodule.  Unchanged. 4. Electronically Signed   By: TKerby MoorsM.D.   On: 01/20/2019 12:45   Ct Abdomen Pelvis W Contrast  Result Date: 01/20/2019 CLINICAL DATA:  Follow-up lung cancer. EXAM: CT CHEST, ABDOMEN, AND PELVIS WITH CONTRAST TECHNIQUE: Multidetector CT imaging of the chest, abdomen and pelvis was performed following the standard protocol during bolus administration of intravenous contrast. CONTRAST:  1090mOMNIPAQUE IOHEXOL 300 MG/ML  SOLN COMPARISON:  05/07/2018 FINDINGS: CT CHEST FINDINGS Cardiovascular: The heart size appears normal. No pericardial effusion. Aortic atherosclerosis. Mediastinum/Nodes: Left lobe of thyroid gland nodule measures 2.3 cm, image 8/2. Small enhancing nodule or lymph node anterior to the proximal arch is unchanged measuring 1.7 cm, image 37/2. No axillary or supraclavicular adenopathy. No hilar or middle or posterior mediastinal adenopathy. Lungs/Pleura: Small bilateral pleural effusions are identified right greater than left. Slightly increased from previous exam. Calcified pleural plaque overlying the posterior and medial right lung is again noted. Advanced centrilobular and paraseptal emphysema. Status post left upper lobectomy. Pulmonary nodule within the lateral left lower lobe measures  9 mm, new from previous exam, image 117/6. Stable 4 mm anterior right upper lobe lung nodule, image 80/6. Musculoskeletal: No chest wall mass or suspicious bone lesions identified. CT ABDOMEN PELVIS FINDINGS Hepatobiliary: No focal liver abnormality is seen. No gallstones, gallbladder wall thickening, or biliary dilatation. Pancreas: Unremarkable. No pancreatic ductal dilatation or surrounding inflammatory  changes. Spleen: Normal in size without focal abnormality. Adrenals/Urinary Tract: Normal appearance of the adrenal glands. Small bilateral low-density kidney lesions are noted, too small to characterize. The urinary bladder appears normal. Stomach/Bowel: Stomach is within normal limits. Appendix appears normal. No evidence of bowel wall thickening, distention, or inflammatory changes. Vascular/Lymphatic: Status post stent graft repair of infrarenal abdominal aortic aneurysm. No abdominopelvic adenopathy identified. Reproductive: Prostate gland is enlarged. Other: No ascites or evidence of peritoneal disease. Musculoskeletal: No acute or significant osseous findings. IMPRESSION: 1. New, suspicious nodule in the lateral left lower lobe measuring 9 mm. Cannot rule out focus of metastatic disease or new lesion. 2. Aortic Atherosclerosis (ICD10-I70.0) and Emphysema (ICD10-J43.9). 3. Left lobe of thyroid gland nodule.  Unchanged. 4. Electronically Signed   By: Kerby Moors M.D.   On: 01/20/2019 12:45    ASSESSMENT AND PLAN:  This is a very pleasant 62 years old white male with a stage IB non-small cell lung cancer, adenocarcinoma status post wedge resection of the left upper lobe followed by 4 cycles of adjuvant systemic chemotherapy with cisplatin and Alimta and he tolerated his treatment well except for fatigue. The patient has been on observation since June 2018.   The recent imaging studies including CT scan of the chest as well as a PET scan showed evidence for disease recurrence with metastatic disease presented with bilateral pulmonary nodules as well as destructive bone lesions at T4 vertebral body, biopsy proven to be metastatic adenocarcinoma. Molecular studies by foundation 1 showed no actionable mutations.  PDL 1 expression was negative. The patient is currently undergoing treatment with carboplatin, Alimta and Ketruda (pembrolizumab) status post 19 cycles.  Starting from cycle #5 he is on  maintenance treatment with Alimta and Keytruda every 3 weeks. The patient has been tolerating this treatment well with no concerning adverse effects. He had repeat CT scan of the chest, abdomen pelvis performed recently.  I personally and independently reviewed the scans and discussed the results with the patient today. His scan showed no concerning findings for disease progression except for new nodule in the left lower lobe measuring 0.9 cm and that would require close observation on upcoming imaging studies. I showed the images to the patient today. I recommended for him to continue with his current treatment with maintenance Alimta and Keytruda.  He will proceed with cycle #20 today. I will see him back for follow-up visit in 3 weeks for evaluation before the next cycle of his treatment. For hypertension he will continue to monitor his blood pressure closely at home. The patient was advised to call immediately if he has any concerning symptoms in the interval. The patient voices understanding of current disease status and treatment options and is in agreement with the current care plan. All questions were answered. The patient knows to call the clinic with any problems, questions or concerns. We can certainly see the patient much sooner if necessary.  Disclaimer: This note was dictated with voice recognition software. Similar sounding words can inadvertently be transcribed and may not be corrected upon review.

## 2019-01-22 NOTE — Patient Instructions (Signed)
[facility] Discharge Instructions for Patients Receiving Chemotherapy  Today you received the following chemotherapy agents: Keytruda and Alimta.  To help prevent nausea and vomiting after your treatment, we encourage you to take your nausea medication as directed.   If you develop nausea and vomiting that is not controlled by your nausea medication, call the clinic.   BELOW ARE SYMPTOMS THAT SHOULD BE REPORTED IMMEDIATELY:  *FEVER GREATER THAN 100.5 F  *CHILLS WITH OR WITHOUT FEVER  NAUSEA AND VOMITING THAT IS NOT CONTROLLED WITH YOUR NAUSEA MEDICATION  *UNUSUAL SHORTNESS OF BREATH  *UNUSUAL BRUISING OR BLEEDING  TENDERNESS IN MOUTH AND THROAT WITH OR WITHOUT PRESENCE OF ULCERS  *URINARY PROBLEMS  *BOWEL PROBLEMS  UNUSUAL RASH Items with * indicate a potential emergency and should be followed up as soon as possible.  Feel free to call the clinic should you have any questions or concerns. The clinic phone number is [phone].  Please show the [facility] at check-in to the Emergency Department and triage nurse.

## 2019-01-23 ENCOUNTER — Telehealth: Payer: Self-pay | Admitting: Internal Medicine

## 2019-01-23 NOTE — Telephone Encounter (Signed)
Scheduled appt per 10/1 los- pt to get an updated schedule next visit.

## 2019-02-11 ENCOUNTER — Other Ambulatory Visit: Payer: Self-pay | Admitting: Medical Oncology

## 2019-02-11 DIAGNOSIS — C3492 Malignant neoplasm of unspecified part of left bronchus or lung: Secondary | ICD-10-CM

## 2019-02-12 ENCOUNTER — Other Ambulatory Visit: Payer: Self-pay

## 2019-02-12 ENCOUNTER — Encounter: Payer: Self-pay | Admitting: Internal Medicine

## 2019-02-12 ENCOUNTER — Inpatient Hospital Stay: Payer: No Typology Code available for payment source

## 2019-02-12 ENCOUNTER — Inpatient Hospital Stay: Payer: No Typology Code available for payment source | Admitting: Internal Medicine

## 2019-02-12 VITALS — BP 165/91 | HR 89 | Temp 98.5°F | Resp 18 | Ht 75.0 in | Wt 163.0 lb

## 2019-02-12 DIAGNOSIS — Z5111 Encounter for antineoplastic chemotherapy: Secondary | ICD-10-CM | POA: Diagnosis not present

## 2019-02-12 DIAGNOSIS — Z5112 Encounter for antineoplastic immunotherapy: Secondary | ICD-10-CM

## 2019-02-12 DIAGNOSIS — C3492 Malignant neoplasm of unspecified part of left bronchus or lung: Secondary | ICD-10-CM | POA: Diagnosis not present

## 2019-02-12 DIAGNOSIS — C3432 Malignant neoplasm of lower lobe, left bronchus or lung: Secondary | ICD-10-CM

## 2019-02-12 LAB — CMP (CANCER CENTER ONLY)
ALT: 19 U/L (ref 0–44)
AST: 36 U/L (ref 15–41)
Albumin: 3.4 g/dL — ABNORMAL LOW (ref 3.5–5.0)
Alkaline Phosphatase: 88 U/L (ref 38–126)
Anion gap: 9 (ref 5–15)
BUN: 7 mg/dL — ABNORMAL LOW (ref 8–23)
CO2: 20 mmol/L — ABNORMAL LOW (ref 22–32)
Calcium: 9.1 mg/dL (ref 8.9–10.3)
Chloride: 107 mmol/L (ref 98–111)
Creatinine: 1.12 mg/dL (ref 0.61–1.24)
GFR, Est AFR Am: >60 mL/min (ref >60)
GFR, Estimated: >60 mL/min (ref >60)
Glucose, Bld: 96 mg/dL (ref 70–99)
Potassium: 3.9 mmol/L (ref 3.5–5.1)
Sodium: 136 mmol/L (ref 135–145)
Total Bilirubin: 0.5 mg/dL (ref 0.3–1.2)
Total Protein: 6.8 g/dL (ref 6.5–8.1)

## 2019-02-12 LAB — CBC WITH DIFFERENTIAL (CANCER CENTER ONLY)
Abs Immature Granulocytes: 0.02 10*3/uL (ref 0.00–0.07)
Basophils Absolute: 0 10*3/uL (ref 0.0–0.1)
Basophils Relative: 0 %
Eosinophils Absolute: 0.1 10*3/uL (ref 0.0–0.5)
Eosinophils Relative: 2 %
HCT: 39.6 % (ref 39.0–52.0)
Hemoglobin: 13.4 g/dL (ref 13.0–17.0)
Immature Granulocytes: 0 %
Lymphocytes Relative: 13 %
Lymphs Abs: 0.9 10*3/uL (ref 0.7–4.0)
MCH: 33.7 pg (ref 26.0–34.0)
MCHC: 33.8 g/dL (ref 30.0–36.0)
MCV: 99.5 fL (ref 80.0–100.0)
Monocytes Absolute: 0.9 10*3/uL (ref 0.1–1.0)
Monocytes Relative: 13 %
Neutro Abs: 4.8 10*3/uL (ref 1.7–7.7)
Neutrophils Relative %: 72 %
Platelet Count: 399 10*3/uL (ref 150–400)
RBC: 3.98 MIL/uL — ABNORMAL LOW (ref 4.22–5.81)
RDW: 14.6 % (ref 11.5–15.5)
WBC Count: 6.7 10*3/uL (ref 4.0–10.5)
nRBC: 0 % (ref 0.0–0.2)

## 2019-02-12 LAB — TSH: TSH: 1.397 u[IU]/mL (ref 0.320–4.118)

## 2019-02-12 MED ORDER — CYANOCOBALAMIN 1000 MCG/ML IJ SOLN
1000.0000 ug | Freq: Once | INTRAMUSCULAR | Status: AC
  Administered 2019-02-12: 1000 ug via INTRAMUSCULAR

## 2019-02-12 MED ORDER — ONDANSETRON HCL 4 MG/2ML IJ SOLN
INTRAMUSCULAR | Status: AC
  Filled 2019-02-12: qty 4

## 2019-02-12 MED ORDER — ONDANSETRON HCL 4 MG/2ML IJ SOLN
8.0000 mg | Freq: Once | INTRAMUSCULAR | Status: AC
  Administered 2019-02-12: 8 mg via INTRAVENOUS

## 2019-02-12 MED ORDER — SODIUM CHLORIDE 0.9 % IV SOLN
200.0000 mg | Freq: Once | INTRAVENOUS | Status: AC
  Administered 2019-02-12: 200 mg via INTRAVENOUS
  Filled 2019-02-12: qty 8

## 2019-02-12 MED ORDER — SODIUM CHLORIDE 0.9 % IV SOLN
520.0000 mg/m2 | Freq: Once | INTRAVENOUS | Status: AC
  Administered 2019-02-12: 1000 mg via INTRAVENOUS
  Filled 2019-02-12: qty 40

## 2019-02-12 MED ORDER — SODIUM CHLORIDE 0.9 % IV SOLN
Freq: Once | INTRAVENOUS | Status: AC
  Administered 2019-02-12: 13:00:00 via INTRAVENOUS
  Filled 2019-02-12: qty 250

## 2019-02-12 MED ORDER — CYANOCOBALAMIN 1000 MCG/ML IJ SOLN
INTRAMUSCULAR | Status: AC
  Filled 2019-02-12: qty 1

## 2019-02-12 MED ORDER — DEXAMETHASONE SODIUM PHOSPHATE 10 MG/ML IJ SOLN
INTRAMUSCULAR | Status: AC
  Filled 2019-02-12: qty 1

## 2019-02-12 MED ORDER — DEXAMETHASONE SODIUM PHOSPHATE 10 MG/ML IJ SOLN
10.0000 mg | Freq: Once | INTRAMUSCULAR | Status: AC
  Administered 2019-02-12: 10 mg via INTRAVENOUS

## 2019-02-12 NOTE — Patient Instructions (Signed)
[facility] Discharge Instructions for Patients Receiving Chemotherapy  Today you received the following chemotherapy agents: Keytruda and Alimta.  To help prevent nausea and vomiting after your treatment, we encourage you to take your nausea medication as directed.   If you develop nausea and vomiting that is not controlled by your nausea medication, call the clinic.   BELOW ARE SYMPTOMS THAT SHOULD BE REPORTED IMMEDIATELY:  *FEVER GREATER THAN 100.5 F  *CHILLS WITH OR WITHOUT FEVER  NAUSEA AND VOMITING THAT IS NOT CONTROLLED WITH YOUR NAUSEA MEDICATION  *UNUSUAL SHORTNESS OF BREATH  *UNUSUAL BRUISING OR BLEEDING  TENDERNESS IN MOUTH AND THROAT WITH OR WITHOUT PRESENCE OF ULCERS  *URINARY PROBLEMS  *BOWEL PROBLEMS  UNUSUAL RASH Items with * indicate a potential emergency and should be followed up as soon as possible.  Feel free to call the clinic should you have any questions or concerns. The clinic phone number is [phone].  Please show the [facility] at check-in to the Emergency Department and triage nurse.

## 2019-02-12 NOTE — Progress Notes (Signed)
[place] [phone]   [phone]  OFFICE PROGRESS NOTE  Patient, No Pcp Per No address on file  DIAGNOSIS: Metastatic non-small cell lung cancer, adenocarcinoma initially diagnosed as stage IB (T2a, N0, M0) non-small cell lung cancer, adenocarcinoma diagnosed in December 2017.  The patient has evidence for disease recurrence in July 2019.  Biomarker Findings Tumor Mutational Burden - TMB-Intermediate (16 Muts/Mb) Microsatellite status - MS-Stable Genomic Findings For a complete list of the genes assayed, please refer to the Appendix. KIT amplification KRAS Z70D SMAD4 splice site 6438-3K>F MM03 I232F 7 Disease relevant genes with no reportable alterations: EGFR, ALK, BRAF, MET, RET, ERBB2, ROS1   PDL 1 expression is 0%  PRIOR THERAPY:  1) status post left lower lobectomy as well as wedge resection of the left upper lobe on 05/11/2016. 2) Adjuvant systemic chemotherapy with cisplatin 75 MG/M2 and Alimta 500 MG/M2 every 3 weeks. First dose 07/03/2016. Status post 4 cycles.  CURRENT THERAPY: Systemic chemotherapy with carboplatin for AUC of 5, Alimta 500 mg/M2 and Keytruda 200 mg IV every 3 weeks.  Status post 20 cycles.  Starting from cycle #5 the patient will be treated with maintenance Alimta and Ketruda (pembrolizumab) every 3 weeks.  INTERVAL HISTORY: William Kales [name] 63 y.o. male returns to the clinic today for follow-up visit.  The patient is feeling fine today with no concerning complaints except for fatigue.  His wife was admitted to the hospital last night and his blood pressure is elevated today.  He denied having any significant chest pain, shortness of breath, cough or hemoptysis.  He denied having any fever or chills.  He has no nausea, vomiting, diarrhea or constipation.  He has no headache or visual changes.  He is here today for evaluation before starting cycle #21 of his treatment.  MEDICAL HISTORY: Past Medical  History:  Diagnosis Date   AAA (abdominal aortic aneurysm) ([place])    AAA (abdominal aortic aneurysm) without rupture ([place]) 02/04/2014   Cancer ([place])    lung   Encounter for antineoplastic chemotherapy 06/06/2016   History of hepatitis B    HNP (herniated nucleus pulposus), lumbar    L4 with radiculopathy   Hypertension    Lung cancer ([place]) 05/18/2016   Malignant neoplasm of lower lobe of left lung ([place]) 04/05/2016   Mass of left lung    Mitral insufficiency 04/06/2016   This patient will eventually need MV repair if his prognosis is good from oncology standpoint. However, his lung cancer therapy  is the priority right now. His cardiac condition won't preclude possible lung surgery.  Once his lung cancer is under control he will need a TEE to further evaluate his mitral valve anatomy and MR severity, and also have ischemic workup as tere is evidence on calcificati   Renal insufficiency 10/09/2016   S/P partial lobectomy of lung 05/11/2016   Varicose veins of legs     ALLERGIES:  is allergic to tramadol.  MEDICATIONS:  Current Outpatient Medications  Medication Sig Dispense Refill   folic acid (FOLVITE) 1 MG tablet TAKE 1 TABLET BY MOUTH EVERY DAY 90 tablet 1   lisinopril (PRINIVIL,ZESTRIL) 2.5 MG tablet Take 1 tablet (2.5 mg total) by mouth daily. 30 tablet 11   No current facility-administered medications for this visit.     SURGICAL HISTORY:  Past Surgical History:  Procedure Laterality Date   ABDOMINAL AORTIC ENDOVASCULAR STENT GRAFT N/A 03/12/2016   Procedure: ABDOMINAL AORTIC  ENDOVASCULAR STENT GRAFT;  Surgeon: Conrad Linden, MD;  Location: [facility];  Service: Vascular;  Laterality: N/A;   COLONOSCOPY     INGUINAL HERNIA REPAIR Left 1975   Left inguinal hernia   INGUINAL HERNIA REPAIR Right    LOBECTOMY Left 05/11/2016   Procedure: LEFT LOWER LOBE LOBECTOMY AND LEFT UPPER LOBE  RESECTION AND PLACEMENT OF ON-Q;  Surgeon: [name], MD;  Location: [place];   Service: Thoracic;  Laterality: Left;   LUMBAR LAMINECTOMY  October 22, 2012   LYMPH NODE DISSECTION Left 05/11/2016   Procedure: LYMPH NODE DISSECTION;  Surgeon: [name], MD;  Location: [facility];  Service: Thoracic;  Laterality: Left;   STAPLING OF BLEBS Left 05/11/2016   Procedure: STAPLING OF APICAL BLEB;  Surgeon: [name], MD;  Location: [facility];  Service: Thoracic;  Laterality: Left;   VIDEO ASSISTED THORACOSCOPY Left 05/18/2016   Procedure: VIDEO ASSISTED THORACOSCOPY WITH REMOVAL OF LEFT APICAL BLEB;  Surgeon: [name], MD;  Location: [place];  Service: Thoracic;  Laterality: Left;   VIDEO ASSISTED THORACOSCOPY (VATS)/WEDGE RESECTION Left 05/11/2016   Procedure: LEFT VIDEO ASSISTED THORACOSCOPY (VATS);  Surgeon: [name], MD;  Location: [facility];  Service: Thoracic;  Laterality: Left;   VIDEO BRONCHOSCOPY N/A 05/11/2016   Procedure: VIDEO BRONCHOSCOPY, LEFT LUNG;  Surgeon: [name], MD;  Location: [place];  Service: Thoracic;  Laterality: N/A;   VIDEO BRONCHOSCOPY N/A 05/18/2016   Procedure: VIDEO BRONCHOSCOPY WITH BRONCHIAL WASHING;  Surgeon: [name], MD;  Location: [facility];  Service: Thoracic;  Laterality: N/A;    REVIEW OF SYSTEMS:  A comprehensive review of systems was negative except for: Constitutional: positive for fatigue   PHYSICAL EXAMINATION: General appearance: alert, cooperative, fatigued and no distress Head: Normocephalic, without obvious abnormality, atraumatic Neck: no adenopathy, no JVD, supple, symmetrical, trachea midline and thyroid not enlarged, symmetric, no tenderness/mass/nodules Lymph nodes: Cervical, supraclavicular, and axillary nodes normal. Resp: clear to auscultation bilaterally Back: symmetric, no curvature. ROM normal. No CVA tenderness. Cardio: regular rate and rhythm, S1, S2 normal, no murmur, click, rub or gallop GI: soft, non-tender; bowel sounds normal; no masses,  no organomegaly Extremities: extremities  normal, atraumatic, no cyanosis or edema   ECOG PERFORMANCE STATUS: 1 - Symptomatic but completely ambulatory  Blood pressure (!) 165/91, pulse 89, temperature 98.5 F (36.9 C), temperature source Oral, resp. rate 18, height _0  (1.905 m), weight 163 lb (73.9 kg), SpO2 95 %.  LABORATORY DATA: Lab Results  Component Value Date   WBC 6.7 02/12/2019   HGB 13.4 02/12/2019   HCT 39.6 02/12/2019   MCV 99.5 02/12/2019   PLT 399 02/12/2019      Chemistry      Component Value Date/Time   NA 134 (L) 01/22/2019 1034   NA 138 01/11/2017 1343   K 3.7 01/22/2019 1034   K 4.7 01/11/2017 1343   CL 105 01/22/2019 1034   CO2 21 (L) 01/22/2019 1034   CO2 23 01/11/2017 1343   BUN 8 01/22/2019 1034   BUN 20.2 01/11/2017 1343   CREATININE 1.20 01/22/2019 1034   CREATININE 1.6 (H) 01/11/2017 1343      Component Value Date/Time   CALCIUM 8.6 (L) 01/22/2019 1034   CALCIUM 9.3 01/11/2017 1343   ALKPHOS 84 01/22/2019 1034   ALKPHOS 89 01/11/2017 1343   AST 35 01/22/2019 1034   AST 41 (H) 01/11/2017 1343   ALT 18 01/22/2019 1034   ALT  27 01/11/2017 1343   BILITOT 0.5 01/22/2019 1034   BILITOT 0.49 01/11/2017 1343       RADIOGRAPHIC STUDIES: Ct Chest W Contrast  Result Date: 01/20/2019 CLINICAL DATA:  Follow-up lung cancer. EXAM: CT CHEST, ABDOMEN, AND PELVIS WITH CONTRAST TECHNIQUE: Multidetector CT imaging of the chest, abdomen and pelvis was performed following the standard protocol during bolus administration of intravenous contrast. CONTRAST:  195m OMNIPAQUE IOHEXOL 300 MG/ML  SOLN COMPARISON:  05/07/2018 FINDINGS: CT CHEST FINDINGS Cardiovascular: The heart size appears normal. No pericardial effusion. Aortic atherosclerosis. Mediastinum/Nodes: Left lobe of thyroid gland nodule measures 2.3 cm, image 8/2. Small enhancing nodule or lymph node anterior to the proximal arch is unchanged measuring 1.7 cm, image 37/2. No axillary or supraclavicular adenopathy. No hilar or middle or posterior  mediastinal adenopathy. Lungs/Pleura: Small bilateral pleural effusions are identified right greater than left. Slightly increased from previous exam. Calcified pleural plaque overlying the posterior and medial right lung is again noted. Advanced centrilobular and paraseptal emphysema. Status post left upper lobectomy. Pulmonary nodule within the lateral left lower lobe measures 9 mm, new from previous exam, image 117/6. Stable 4 mm anterior right upper lobe lung nodule, image 80/6. Musculoskeletal: No chest wall mass or suspicious bone lesions identified. CT ABDOMEN PELVIS FINDINGS Hepatobiliary: No focal liver abnormality is seen. No gallstones, gallbladder wall thickening, or biliary dilatation. Pancreas: Unremarkable. No pancreatic ductal dilatation or surrounding inflammatory changes. Spleen: Normal in size without focal abnormality. Adrenals/Urinary Tract: Normal appearance of the adrenal glands. Small bilateral low-density kidney lesions are noted, too small to characterize. The urinary bladder appears normal. Stomach/Bowel: Stomach is within normal limits. Appendix appears normal. No evidence of bowel wall thickening, distention, or inflammatory changes. Vascular/Lymphatic: Status post stent graft repair of infrarenal abdominal aortic aneurysm. No abdominopelvic adenopathy identified. Reproductive: Prostate gland is enlarged. Other: No ascites or evidence of peritoneal disease. Musculoskeletal: No acute or significant osseous findings. IMPRESSION: 1. New, suspicious nodule in the lateral left lower lobe measuring 9 mm. Cannot rule out focus of metastatic disease or new lesion. 2. Aortic Atherosclerosis (ICD10-I70.0) and Emphysema (ICD10-J43.9). 3. Left lobe of thyroid gland nodule.  Unchanged. 4. Electronically Signed   By: TKerby MoorsM.D.   On: 01/20/2019 12:45   Ct Abdomen Pelvis W Contrast  Result Date: 01/20/2019 CLINICAL DATA:  Follow-up lung cancer. EXAM: CT CHEST, ABDOMEN, AND PELVIS WITH  CONTRAST TECHNIQUE: Multidetector CT imaging of the chest, abdomen and pelvis was performed following the standard protocol during bolus administration of intravenous contrast. CONTRAST:  1040mOMNIPAQUE IOHEXOL 300 MG/ML  SOLN COMPARISON:  05/07/2018 FINDINGS: CT CHEST FINDINGS Cardiovascular: The heart size appears normal. No pericardial effusion. Aortic atherosclerosis. Mediastinum/Nodes: Left lobe of thyroid gland nodule measures 2.3 cm, image 8/2. Small enhancing nodule or lymph node anterior to the proximal arch is unchanged measuring 1.7 cm, image 37/2. No axillary or supraclavicular adenopathy. No hilar or middle or posterior mediastinal adenopathy. Lungs/Pleura: Small bilateral pleural effusions are identified right greater than left. Slightly increased from previous exam. Calcified pleural plaque overlying the posterior and medial right lung is again noted. Advanced centrilobular and paraseptal emphysema. Status post left upper lobectomy. Pulmonary nodule within the lateral left lower lobe measures 9 mm, new from previous exam, image 117/6. Stable 4 mm anterior right upper lobe lung nodule, image 80/6. Musculoskeletal: No chest wall mass or suspicious bone lesions identified. CT ABDOMEN PELVIS FINDINGS Hepatobiliary: No focal liver abnormality is seen. No gallstones, gallbladder wall thickening, or biliary dilatation. Pancreas: Unremarkable.  No pancreatic ductal dilatation or surrounding inflammatory changes. Spleen: Normal in size without focal abnormality. Adrenals/Urinary Tract: Normal appearance of the adrenal glands. Small bilateral low-density kidney lesions are noted, too small to characterize. The urinary bladder appears normal. Stomach/Bowel: Stomach is within normal limits. Appendix appears normal. No evidence of bowel wall thickening, distention, or inflammatory changes. Vascular/Lymphatic: Status post stent graft repair of infrarenal abdominal aortic aneurysm. No abdominopelvic adenopathy  identified. Reproductive: Prostate gland is enlarged. Other: No ascites or evidence of peritoneal disease. Musculoskeletal: No acute or significant osseous findings. IMPRESSION: 1. New, suspicious nodule in the lateral left lower lobe measuring 9 mm. Cannot rule out focus of metastatic disease or new lesion. 2. Aortic Atherosclerosis (ICD10-I70.0) and Emphysema (ICD10-J43.9). 3. Left lobe of thyroid gland nodule.  Unchanged. 4. Electronically Signed   By: Kerby Moors M.D.   On: 01/20/2019 12:45    ASSESSMENT AND PLAN:  This is a very pleasant 63 years old white male with a stage IB non-small cell lung cancer, adenocarcinoma status post wedge resection of the left upper lobe followed by 4 cycles of adjuvant systemic chemotherapy with cisplatin and Alimta and he tolerated his treatment well except for fatigue. The patient has been on observation since June 2018.   The recent imaging studies including CT scan of the chest as well as a PET scan showed evidence for disease recurrence with metastatic disease presented with bilateral pulmonary nodules as well as destructive bone lesions at T4 vertebral body, biopsy proven to be metastatic adenocarcinoma. Molecular studies by foundation 1 showed no actionable mutations.  PDL 1 expression was negative. The patient is currently undergoing treatment with carboplatin, Alimta and Ketruda (pembrolizumab) status post 20 cycles.  Starting from cycle #5 he is on maintenance treatment with Alimta and Keytruda every 3 weeks. The patient continues to tolerate his treatment well with no concerning adverse effects. I recommended for the patient to proceed with cycle #21 today as planned. He will come back for follow-up visit in 3 weeks for evaluation before the next cycle of his treatment. For hypertension he will continue to monitor his blood pressure closely at home. The patient was advised to call immediately if he has any concerning symptoms in the interval. The  patient voices understanding of current disease status and treatment options and is in agreement with the current care plan. All questions were answered. The patient knows to call the clinic with any problems, questions or concerns. We can certainly see the patient much sooner if necessary.  Disclaimer: This note was dictated with voice recognition software. Similar sounding words can inadvertently be transcribed and may not be corrected upon review.

## 2019-02-13 ENCOUNTER — Telehealth: Payer: Self-pay | Admitting: *Deleted

## 2019-02-13 ENCOUNTER — Other Ambulatory Visit: Payer: Self-pay

## 2019-02-13 DIAGNOSIS — Z20822 Contact with and (suspected) exposure to covid-19: Secondary | ICD-10-CM

## 2019-02-13 NOTE — Telephone Encounter (Signed)
Received call from patient. He states his wife has tested positive for Covid 19 as of yesterday.  She is self isolating at home. [name] knows not to have contact with her without masking, knows to social distance etc.  He is currently receiving Keytruda and Alimta for Stage 4 lung cancer. He was here yesterday for offcec visit and infusion. Advised patient he needs to be tested for Covid 19 @ the [facility] and to call us with the results of this. He voiced understanding and will go this afternoon. He maintained his masking throughout his time here yesterday. Denies any new symptoms.

## 2019-02-14 LAB — NOVEL CORONAVIRUS, NAA: SARS-CoV-2, NAA: NOT DETECTED

## 2019-02-16 ENCOUNTER — Telehealth: Payer: Self-pay | Admitting: Medical Oncology

## 2019-02-16 NOTE — Telephone Encounter (Signed)
Covid test result- negative- I returned pts call and left message that his covid test was negative.

## 2019-02-19 ENCOUNTER — Telehealth: Payer: Self-pay | Admitting: Medical Oncology

## 2019-02-19 NOTE — Telephone Encounter (Signed)
Covid exposure again-Sister -in -law who lives upstairs in same house with [name] tested positive for covid on Tuesday. He said "she stays in her room and does not come out".His next tx is 02/28/19.   I told him he will need to get retested and I will call back with details.

## 2019-02-19 NOTE — Telephone Encounter (Signed)
Yes he will need to be retested before coming to the office.

## 2019-02-27 ENCOUNTER — Telehealth: Payer: Self-pay | Admitting: Physician Assistant

## 2019-02-27 ENCOUNTER — Other Ambulatory Visit: Payer: Self-pay | Admitting: Medical Oncology

## 2019-02-27 DIAGNOSIS — C3492 Malignant neoplasm of unspecified part of left bronchus or lung: Secondary | ICD-10-CM

## 2019-02-27 NOTE — Telephone Encounter (Signed)
Spoke to the patient about going to [facility] on 03/02/2019 for a COVID test before coming in for chemotherapy on 03/05/2019. He expressed understanding.

## 2019-03-02 ENCOUNTER — Other Ambulatory Visit: Payer: Self-pay

## 2019-03-02 DIAGNOSIS — Z20822 Contact with and (suspected) exposure to covid-19: Secondary | ICD-10-CM

## 2019-03-04 ENCOUNTER — Other Ambulatory Visit: Payer: Self-pay | Admitting: Medical Oncology

## 2019-03-04 DIAGNOSIS — C3492 Malignant neoplasm of unspecified part of left bronchus or lung: Secondary | ICD-10-CM

## 2019-03-05 ENCOUNTER — Inpatient Hospital Stay: Payer: No Typology Code available for payment source

## 2019-03-05 ENCOUNTER — Telehealth: Payer: Self-pay | Admitting: Physician Assistant

## 2019-03-05 ENCOUNTER — Telehealth: Payer: Self-pay | Admitting: *Deleted

## 2019-03-05 ENCOUNTER — Inpatient Hospital Stay: Payer: No Typology Code available for payment source | Admitting: Physician Assistant

## 2019-03-05 LAB — NOVEL CORONAVIRUS, NAA: SARS-CoV-2, NAA: NOT DETECTED

## 2019-03-05 NOTE — Telephone Encounter (Signed)
R/s appt per 11/12 sch message - pt is aware of new appt date and time   

## 2019-03-05 NOTE — Telephone Encounter (Signed)
Records faxed to [facility] - release [phone]

## 2019-03-06 ENCOUNTER — Inpatient Hospital Stay: Payer: No Typology Code available for payment source | Attending: Internal Medicine

## 2019-03-06 ENCOUNTER — Inpatient Hospital Stay (HOSPITAL_BASED_OUTPATIENT_CLINIC_OR_DEPARTMENT_OTHER): Payer: No Typology Code available for payment source | Admitting: Physician Assistant

## 2019-03-06 ENCOUNTER — Encounter: Payer: Self-pay | Admitting: Physician Assistant

## 2019-03-06 ENCOUNTER — Other Ambulatory Visit: Payer: Self-pay

## 2019-03-06 ENCOUNTER — Inpatient Hospital Stay: Payer: No Typology Code available for payment source

## 2019-03-06 ENCOUNTER — Telehealth: Payer: Self-pay | Admitting: Physician Assistant

## 2019-03-06 VITALS — BP 140/85 | HR 100 | Temp 97.6°F | Resp 18 | Ht 75.0 in | Wt 160.2 lb

## 2019-03-06 DIAGNOSIS — C3492 Malignant neoplasm of unspecified part of left bronchus or lung: Secondary | ICD-10-CM

## 2019-03-06 DIAGNOSIS — Z5112 Encounter for antineoplastic immunotherapy: Secondary | ICD-10-CM | POA: Insufficient documentation

## 2019-03-06 DIAGNOSIS — C3432 Malignant neoplasm of lower lobe, left bronchus or lung: Secondary | ICD-10-CM | POA: Diagnosis present

## 2019-03-06 DIAGNOSIS — C3412 Malignant neoplasm of upper lobe, left bronchus or lung: Secondary | ICD-10-CM | POA: Insufficient documentation

## 2019-03-06 DIAGNOSIS — Z79899 Other long term (current) drug therapy: Secondary | ICD-10-CM | POA: Diagnosis not present

## 2019-03-06 DIAGNOSIS — I10 Essential (primary) hypertension: Secondary | ICD-10-CM | POA: Diagnosis not present

## 2019-03-06 DIAGNOSIS — Z5111 Encounter for antineoplastic chemotherapy: Secondary | ICD-10-CM

## 2019-03-06 LAB — CMP (CANCER CENTER ONLY)
ALT: 31 U/L (ref 0–44)
AST: 39 U/L (ref 15–41)
Albumin: 3.3 g/dL — ABNORMAL LOW (ref 3.5–5.0)
Alkaline Phosphatase: 84 U/L (ref 38–126)
Anion gap: 12 (ref 5–15)
BUN: 10 mg/dL (ref 8–23)
CO2: 20 mmol/L — ABNORMAL LOW (ref 22–32)
Calcium: 9.1 mg/dL (ref 8.9–10.3)
Chloride: 107 mmol/L (ref 98–111)
Creatinine: 1.29 mg/dL — ABNORMAL HIGH (ref 0.61–1.24)
GFR, Est AFR Am: >60 mL/min (ref >60)
GFR, Estimated: 58 mL/min — ABNORMAL LOW (ref >60)
Glucose, Bld: 129 mg/dL — ABNORMAL HIGH (ref 70–99)
Potassium: 4.1 mmol/L (ref 3.5–5.1)
Sodium: 139 mmol/L (ref 135–145)
Total Bilirubin: 0.4 mg/dL (ref 0.3–1.2)
Total Protein: 6.9 g/dL (ref 6.5–8.1)

## 2019-03-06 LAB — CBC WITH DIFFERENTIAL (CANCER CENTER ONLY)
Abs Immature Granulocytes: 0.03 10*3/uL (ref 0.00–0.07)
Basophils Absolute: 0.1 10*3/uL (ref 0.0–0.1)
Basophils Relative: 1 %
Eosinophils Absolute: 0.2 10*3/uL (ref 0.0–0.5)
Eosinophils Relative: 2 %
HCT: 40.2 % (ref 39.0–52.0)
Hemoglobin: 13 g/dL (ref 13.0–17.0)
Immature Granulocytes: 1 %
Lymphocytes Relative: 17 %
Lymphs Abs: 1.1 10*3/uL (ref 0.7–4.0)
MCH: 32.6 pg (ref 26.0–34.0)
MCHC: 32.3 g/dL (ref 30.0–36.0)
MCV: 100.8 fL — ABNORMAL HIGH (ref 80.0–100.0)
Monocytes Absolute: 0.8 10*3/uL (ref 0.1–1.0)
Monocytes Relative: 13 %
Neutro Abs: 4.3 10*3/uL (ref 1.7–7.7)
Neutrophils Relative %: 66 %
Platelet Count: 466 10*3/uL — ABNORMAL HIGH (ref 150–400)
RBC: 3.99 MIL/uL — ABNORMAL LOW (ref 4.22–5.81)
RDW: 13.5 % (ref 11.5–15.5)
WBC Count: 6.5 10*3/uL (ref 4.0–10.5)
nRBC: 0 % (ref 0.0–0.2)

## 2019-03-06 LAB — TSH: TSH: 2.121 u[IU]/mL (ref 0.320–4.118)

## 2019-03-06 MED ORDER — DEXAMETHASONE SODIUM PHOSPHATE 10 MG/ML IJ SOLN
10.0000 mg | Freq: Once | INTRAMUSCULAR | Status: AC
  Administered 2019-03-06: 10 mg via INTRAVENOUS

## 2019-03-06 MED ORDER — DEXAMETHASONE SODIUM PHOSPHATE 10 MG/ML IJ SOLN
INTRAMUSCULAR | Status: AC
  Filled 2019-03-06: qty 1

## 2019-03-06 MED ORDER — SODIUM CHLORIDE 0.9 % IV SOLN
Freq: Once | INTRAVENOUS | Status: AC
  Administered 2019-03-06: 10:00:00 via INTRAVENOUS
  Filled 2019-03-06: qty 250

## 2019-03-06 MED ORDER — ONDANSETRON HCL 4 MG/2ML IJ SOLN
INTRAMUSCULAR | Status: AC
  Filled 2019-03-06: qty 4

## 2019-03-06 MED ORDER — SODIUM CHLORIDE 0.9 % IV SOLN
200.0000 mg | Freq: Once | INTRAVENOUS | Status: AC
  Administered 2019-03-06: 200 mg via INTRAVENOUS
  Filled 2019-03-06: qty 8

## 2019-03-06 MED ORDER — ONDANSETRON HCL 4 MG/2ML IJ SOLN
8.0000 mg | Freq: Once | INTRAMUSCULAR | Status: AC
  Administered 2019-03-06: 8 mg via INTRAVENOUS

## 2019-03-06 MED ORDER — SODIUM CHLORIDE 0.9 % IV SOLN
520.0000 mg/m2 | Freq: Once | INTRAVENOUS | Status: AC
  Administered 2019-03-06: 1000 mg via INTRAVENOUS
  Filled 2019-03-06: qty 40

## 2019-03-06 NOTE — Telephone Encounter (Signed)
Scheduled per los. Given printout in infusion

## 2019-03-06 NOTE — Progress Notes (Signed)
[facility] OFFICE PROGRESS NOTE  Patient, No Pcp Per No address on file  DIAGNOSIS: Metastatic non-small cell lung cancer, adenocarcinoma initially diagnosed as stage IB (T2a, N0, M0) non-small cell lung cancer, adenocarcinoma diagnosed in December 2017.  The patient has evidence for disease recurrence in July 2019.  Biomarker Findings Tumor Mutational Burden - TMB-Intermediate (16 Muts/Mb) Microsatellite status - MS-Stable Genomic Findings For a complete list of the genes assayed, please refer to the Appendix. KIT amplification KRAS R60A SMAD4 splice site [id number]>X BJ47 I232F 7 Disease relevant genes with no reportable alterations: EGFR, ALK, BRAF, MET, RET, ERBB2, ROS1   PDL 1 expression is 0%  PRIOR THERAPY:  1) status post left lower lobectomy as well as wedge resection of the left upper lobe on 05/11/2016. 2) Adjuvant systemic chemotherapy with cisplatin 75 MG/M2 and Alimta 500 MG/M2 every 3 weeks. First dose 07/03/2016. Status post 4 cycles.  CURRENT THERAPY: Systemic chemotherapy with carboplatin for AUC of 5, Alimta 500 mg/M2 and Keytruda 200 mg IV every 3 weeks.  Status post 21 cycles.  Starting from cycle #5 the patient will be treated with maintenance Alimta and Ketruda (pembrolizumab) every 3 weeks.  INTERVAL HISTORY: William Kales [name] 63 y.o. male returns to the clinic for a follow up visit. The patient had to be tested for COVID 19 after he had exposures from his wife and sister. Fortunately, he was tested twice and was negative. Today, he feels fairly well without any concerning complaints. He denies any fever, chills, night sweats, or weight loss. He denies any chest pain, cough, or hemoptyosis. He reports his baseline dyspnea on exertion. He denies any nausea, vomiting, diarrhea, or constipation. He denies any headache or visual changes. He denies any rashes or skin changes. He is tolerating his treatment well except for mild fatigue. He is here today  for evaluation before starting cycle #22.   MEDICAL HISTORY: Past Medical History:  Diagnosis Date  . AAA (abdominal aortic aneurysm) ([place])   . AAA (abdominal aortic aneurysm) without rupture ([place]) 02/04/2014  . Cancer ([place])    lung  . Encounter for antineoplastic chemotherapy 06/06/2016  . History of hepatitis B   . HNP (herniated nucleus pulposus), lumbar    L4 with radiculopathy  . Hypertension   . Lung cancer ([place]) 05/18/2016  . Malignant neoplasm of lower lobe of left lung ([place]) 04/05/2016  . Mass of left lung   . Mitral insufficiency 04/06/2016   This patient will eventually need MV repair if his prognosis is good from oncology standpoint. However, his lung cancer therapy  is the priority right now. His cardiac condition won't preclude possible lung surgery.  Once his lung cancer is under control he will need a TEE to further evaluate his mitral valve anatomy and MR severity, and also have ischemic workup as tere is evidence on calcificati  . Renal insufficiency 10/09/2016  . S/P partial lobectomy of lung 05/11/2016  . Varicose veins of legs     ALLERGIES:  is allergic to tramadol.  MEDICATIONS:  Current Outpatient Medications  Medication Sig Dispense Refill  . folic acid (FOLVITE) 1 MG tablet TAKE 1 TABLET BY MOUTH EVERY DAY 90 tablet 1  . lisinopril (PRINIVIL,ZESTRIL) 2.5 MG tablet Take 1 tablet (2.5 mg total) by mouth daily. 30 tablet 11   No current facility-administered medications for this visit.     SURGICAL HISTORY:  Past Surgical History:  Procedure Laterality Date  . ABDOMINAL AORTIC ENDOVASCULAR STENT GRAFT N/A  03/12/2016   Procedure: ABDOMINAL AORTIC ENDOVASCULAR STENT GRAFT;  Surgeon: Conrad Windthorst, MD;  Location: [facility];  Service: Vascular;  Laterality: N/A;  . COLONOSCOPY    . INGUINAL HERNIA REPAIR Left 1975   Left inguinal hernia  . INGUINAL HERNIA REPAIR Right   . LOBECTOMY Left 05/11/2016   Procedure: LEFT LOWER LOBE LOBECTOMY AND LEFT UPPER LOBE   RESECTION AND PLACEMENT OF ON-Q;  Surgeon: [name], MD;  Location: [facility];  Service: Thoracic;  Laterality: Left;  . LUMBAR LAMINECTOMY  October 22, 2012  . LYMPH NODE DISSECTION Left 05/11/2016   Procedure: LYMPH NODE DISSECTION;  Surgeon: [name], MD;  Location: [facility];  Service: Thoracic;  Laterality: Left;  . STAPLING OF BLEBS Left 05/11/2016   Procedure: STAPLING OF APICAL BLEB;  Surgeon: [name], MD;  Location: [facility];  Service: Thoracic;  Laterality: Left;  Marland Kitchen VIDEO ASSISTED THORACOSCOPY Left 05/18/2016   Procedure: VIDEO ASSISTED THORACOSCOPY WITH REMOVAL OF LEFT APICAL BLEB;  Surgeon: [name], MD;  Location: [facility];  Service: Thoracic;  Laterality: Left;  Marland Kitchen VIDEO ASSISTED THORACOSCOPY (VATS)/WEDGE RESECTION Left 05/11/2016   Procedure: LEFT VIDEO ASSISTED THORACOSCOPY (VATS);  Surgeon: [name], MD;  Location: [facility];  Service: Thoracic;  Laterality: Left;  Marland Kitchen VIDEO BRONCHOSCOPY N/A 05/11/2016   Procedure: VIDEO BRONCHOSCOPY, LEFT LUNG;  Surgeon: [name], MD;  Location: [facility];  Service: Thoracic;  Laterality: N/A;  . VIDEO BRONCHOSCOPY N/A 05/18/2016   Procedure: VIDEO BRONCHOSCOPY WITH BRONCHIAL WASHING;  Surgeon: [name], MD;  Location: [facility];  Service: Thoracic;  Laterality: N/A;    REVIEW OF SYSTEMS:   Review of Systems  Constitutional: Positive for fatigue. Negative for appetite change, chills, fever and unexpected weight change.  HENT: Negative for mouth sores, nosebleeds, sore throat and trouble swallowing.   Eyes: Negative for eye problems and icterus.  Respiratory: Positive for shortness of breath with exertion. Negative for cough, hemoptysis, and wheezing.   Cardiovascular: Negative for chest pain and leg swelling.  Gastrointestinal: Negative for abdominal pain, constipation, diarrhea, nausea and vomiting.  Genitourinary: Negative for bladder incontinence, difficulty urinating, dysuria, frequency and hematuria.    Musculoskeletal: Negative for back pain, gait problem, neck pain and neck stiffness.  Skin: Negative for itching and rash.  Neurological: Negative for dizziness, extremity weakness, gait problem, headaches, light-headedness and seizures.  Hematological: Negative for adenopathy. Does not bruise/bleed easily.  Psychiatric/Behavioral: Negative for confusion, depression and sleep disturbance. The patient is not nervous/anxious.     PHYSICAL EXAMINATION:  There were no vitals taken for this visit.  ECOG PERFORMANCE STATUS: 1 - Symptomatic but completely ambulatory  Physical Exam  Constitutional: Oriented to person, place, and time and well-developed, well-nourished, and in no distress.  HENT:  Head: Normocephalic and atraumatic.  Mouth/Throat: Oropharynx is clear and moist. No oropharyngeal exudate.  Eyes: Conjunctivae are normal. Right eye exhibits no discharge. Left eye exhibits no discharge. No scleral icterus.  Neck: Normal range of motion. Neck supple.  Cardiovascular: Normal rate, regular rhythm, normal heart sounds and intact distal pulses.   Pulmonary/Chest: Effort normal. Decreased breath sounds in all lung fields. No respiratory distress. No wheezes. No rales.  Abdominal: Soft. Bowel sounds are normal. Exhibits no distension and no mass. There is no tenderness.  Musculoskeletal: Normal range of motion. Exhibits no edema.  Lymphadenopathy:    No cervical adenopathy.  Neurological: Alert and oriented to person, place, and time. Exhibits normal muscle  tone. Gait normal. Coordination normal.  Skin: Skin is warm and dry. No rash noted. Not diaphoretic. No erythema. No pallor.  Psychiatric: Mood, memory and judgment normal.  Vitals reviewed.  LABORATORY DATA: Lab Results  Component Value Date   WBC 6.7 02/12/2019   HGB 13.4 02/12/2019   HCT 39.6 02/12/2019   MCV 99.5 02/12/2019   PLT 399 02/12/2019      Chemistry      Component Value Date/Time   NA 136 02/12/2019 1207    NA 138 01/11/2017 1343   K 3.9 02/12/2019 1207   K 4.7 01/11/2017 1343   CL 107 02/12/2019 1207   CO2 20 (L) 02/12/2019 1207   CO2 23 01/11/2017 1343   BUN 7 (L) 02/12/2019 1207   BUN 20.2 01/11/2017 1343   CREATININE 1.12 02/12/2019 1207   CREATININE 1.6 (H) 01/11/2017 1343      Component Value Date/Time   CALCIUM 9.1 02/12/2019 1207   CALCIUM 9.3 01/11/2017 1343   ALKPHOS 88 02/12/2019 1207   ALKPHOS 89 01/11/2017 1343   AST 36 02/12/2019 1207   AST 41 (H) 01/11/2017 1343   ALT 19 02/12/2019 1207   ALT 27 01/11/2017 1343   BILITOT 0.5 02/12/2019 1207   BILITOT 0.49 01/11/2017 1343       RADIOGRAPHIC STUDIES:  No results found.   ASSESSMENT/PLAN:  This is a very pleasant 63 year old African American male with metastatic disease who was initially diagnosed with stage IB non-small cell lung cancer, adenocarcinoma of the left upper lobe. Molecular studies showed no actionable mutations. PD-L1 expression was negative. First diagnosed December 2017.  He was status post a wedge resection of the left upper lobe followed by 4 cycles of adjuvant systemic chemotherapy with cisplatin and Alimta. He tolerated treatment well except for fatigue.  He had been on observation until June of 2018.  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   The is currently undergoing systemic chemotherapy with carboplatin, alimta, and keytruda. He is status post 21 cycles. Starting from cycle #5, he has been on maintenance Alimta and Keytruda every 3 weeks.   The patient was seen with Dr. Julien Nordmann today. Labs were reviewed. Recommend that he proceed with cycle #22 today as scheduled.   We will see him back for a follow up visit in 3 weeks for evaluation before starting cycle #23.   The patient was advised to call immediately if she has any concerning symptoms in the  interval. The patient voices understanding of current disease status and treatment options and is in agreement with the current care plan. All questions were answered. The patient knows to call the clinic with any problems, questions or concerns. We can certainly see the patient much sooner if necessary   No orders of the defined types were placed in this encounter.    William Gadea L Dajanee Voorheis, PA-C 03/06/19  ADDENDUM: Hematology/Oncology Attending: I had a face-to-face encounter with the patient today.  I recommended his care plan.  This is a very pleasant 63 years old African-American male with metastatic non-small cell lung cancer, adenocarcinoma status post induction chemotherapy with carboplatin, Alimta and Keytruda for 4 cycles.  He is currently on maintenance treatment with Alimta and Keytruda status post 21 cycles.  The patient has been tolerating this treatment well with no concerning adverse effects. He has 2 member of  his family including his wife were tested positive for COVID-19 2-3 weeks ago but the patient was tested recently and he is negative. I recommended for the patient to proceed with his treatment today as planned. We will see him back for follow-up visit in 3 weeks for evaluation before the next cycle of his treatment. He was advised to call immediately if he has any concerning symptoms in the interval.  Disclaimer: This note was dictated with voice recognition software. Similar sounding words can inadvertently be transcribed and may be missed upon review. Eilleen Kempf, MD 03/06/19

## 2019-03-06 NOTE — Patient Instructions (Signed)
[facility] Discharge Instructions for Patients Receiving Chemotherapy  Today you received the following chemotherapy agents: Keytruda, Alimta   To help prevent nausea and vomiting after your treatment, we encourage you to take your nausea medication as directed.    If you develop nausea and vomiting that is not controlled by your nausea medication, call the clinic.   BELOW ARE SYMPTOMS THAT SHOULD BE REPORTED IMMEDIATELY:  *FEVER GREATER THAN 100.5 F  *CHILLS WITH OR WITHOUT FEVER  NAUSEA AND VOMITING THAT IS NOT CONTROLLED WITH YOUR NAUSEA MEDICATION  *UNUSUAL SHORTNESS OF BREATH  *UNUSUAL BRUISING OR BLEEDING  TENDERNESS IN MOUTH AND THROAT WITH OR WITHOUT PRESENCE OF ULCERS  *URINARY PROBLEMS  *BOWEL PROBLEMS  UNUSUAL RASH Items with * indicate a potential emergency and should be followed up as soon as possible.  Feel free to call the clinic should you have any questions or concerns. The clinic phone number is [phone].  Please show the [facility] at check-in to the Emergency Department and triage nurse.

## 2019-03-26 ENCOUNTER — Inpatient Hospital Stay: Payer: No Typology Code available for payment source | Attending: Internal Medicine

## 2019-03-26 ENCOUNTER — Other Ambulatory Visit: Payer: Self-pay

## 2019-03-26 ENCOUNTER — Encounter: Payer: Self-pay | Admitting: Internal Medicine

## 2019-03-26 ENCOUNTER — Inpatient Hospital Stay: Payer: No Typology Code available for payment source

## 2019-03-26 ENCOUNTER — Inpatient Hospital Stay (HOSPITAL_BASED_OUTPATIENT_CLINIC_OR_DEPARTMENT_OTHER): Payer: No Typology Code available for payment source | Admitting: Internal Medicine

## 2019-03-26 VITALS — BP 147/94 | HR 124 | Temp 98.2°F | Resp 19 | Ht 75.0 in | Wt 163.3 lb

## 2019-03-26 VITALS — HR 99

## 2019-03-26 DIAGNOSIS — I10 Essential (primary) hypertension: Secondary | ICD-10-CM | POA: Insufficient documentation

## 2019-03-26 DIAGNOSIS — Z5112 Encounter for antineoplastic immunotherapy: Secondary | ICD-10-CM | POA: Insufficient documentation

## 2019-03-26 DIAGNOSIS — C3432 Malignant neoplasm of lower lobe, left bronchus or lung: Secondary | ICD-10-CM | POA: Diagnosis present

## 2019-03-26 DIAGNOSIS — C3492 Malignant neoplasm of unspecified part of left bronchus or lung: Secondary | ICD-10-CM | POA: Diagnosis not present

## 2019-03-26 DIAGNOSIS — Z5111 Encounter for antineoplastic chemotherapy: Secondary | ICD-10-CM

## 2019-03-26 DIAGNOSIS — C3490 Malignant neoplasm of unspecified part of unspecified bronchus or lung: Secondary | ICD-10-CM

## 2019-03-26 DIAGNOSIS — Z79899 Other long term (current) drug therapy: Secondary | ICD-10-CM | POA: Insufficient documentation

## 2019-03-26 LAB — CMP (CANCER CENTER ONLY)
ALT: 21 U/L (ref 0–44)
AST: 38 U/L (ref 15–41)
Albumin: 3.6 g/dL (ref 3.5–5.0)
Alkaline Phosphatase: 99 U/L (ref 38–126)
Anion gap: 13 (ref 5–15)
BUN: 10 mg/dL (ref 8–23)
CO2: 20 mmol/L — ABNORMAL LOW (ref 22–32)
Calcium: 9.3 mg/dL (ref 8.9–10.3)
Chloride: 105 mmol/L (ref 98–111)
Creatinine: 1.22 mg/dL (ref 0.61–1.24)
GFR, Est AFR Am: >60 mL/min (ref >60)
GFR, Estimated: >60 mL/min (ref >60)
Glucose, Bld: 87 mg/dL (ref 70–99)
Potassium: 3.8 mmol/L (ref 3.5–5.1)
Sodium: 138 mmol/L (ref 135–145)
Total Bilirubin: 0.6 mg/dL (ref 0.3–1.2)
Total Protein: 7.4 g/dL (ref 6.5–8.1)

## 2019-03-26 LAB — CBC WITH DIFFERENTIAL (CANCER CENTER ONLY)
Abs Immature Granulocytes: 0.04 10*3/uL (ref 0.00–0.07)
Basophils Absolute: 0 10*3/uL (ref 0.0–0.1)
Basophils Relative: 0 %
Eosinophils Absolute: 0.1 10*3/uL (ref 0.0–0.5)
Eosinophils Relative: 1 %
HCT: 41.2 % (ref 39.0–52.0)
Hemoglobin: 13.6 g/dL (ref 13.0–17.0)
Immature Granulocytes: 0 %
Lymphocytes Relative: 11 %
Lymphs Abs: 1 10*3/uL (ref 0.7–4.0)
MCH: 33.4 pg (ref 26.0–34.0)
MCHC: 33 g/dL (ref 30.0–36.0)
MCV: 101.2 fL — ABNORMAL HIGH (ref 80.0–100.0)
Monocytes Absolute: 1.1 10*3/uL — ABNORMAL HIGH (ref 0.1–1.0)
Monocytes Relative: 12 %
Neutro Abs: 7 10*3/uL (ref 1.7–7.7)
Neutrophils Relative %: 76 %
Platelet Count: 480 10*3/uL — ABNORMAL HIGH (ref 150–400)
RBC: 4.07 MIL/uL — ABNORMAL LOW (ref 4.22–5.81)
RDW: 15.5 % (ref 11.5–15.5)
WBC Count: 9.3 10*3/uL (ref 4.0–10.5)
nRBC: 0 % (ref 0.0–0.2)

## 2019-03-26 LAB — TSH: TSH: 2.355 u[IU]/mL (ref 0.320–4.118)

## 2019-03-26 MED ORDER — ONDANSETRON HCL 4 MG/2ML IJ SOLN
8.0000 mg | Freq: Once | INTRAMUSCULAR | Status: AC
  Administered 2019-03-26: 8 mg via INTRAVENOUS

## 2019-03-26 MED ORDER — DEXAMETHASONE SODIUM PHOSPHATE 10 MG/ML IJ SOLN
10.0000 mg | Freq: Once | INTRAMUSCULAR | Status: AC
  Administered 2019-03-26: 10 mg via INTRAVENOUS

## 2019-03-26 MED ORDER — SODIUM CHLORIDE 0.9 % IV SOLN
Freq: Once | INTRAVENOUS | Status: AC
  Administered 2019-03-26: 13:00:00 via INTRAVENOUS
  Filled 2019-03-26: qty 250

## 2019-03-26 MED ORDER — SODIUM CHLORIDE 0.9 % IV SOLN
520.0000 mg/m2 | Freq: Once | INTRAVENOUS | Status: AC
  Administered 2019-03-26: 1000 mg via INTRAVENOUS
  Filled 2019-03-26: qty 40

## 2019-03-26 MED ORDER — DEXAMETHASONE SODIUM PHOSPHATE 10 MG/ML IJ SOLN
INTRAMUSCULAR | Status: AC
  Filled 2019-03-26: qty 1

## 2019-03-26 MED ORDER — CYANOCOBALAMIN 1000 MCG/ML IJ SOLN: 1000.0000 ug | Freq: Once | INTRAMUSCULAR | Status: DC

## 2019-03-26 MED ORDER — SODIUM CHLORIDE 0.9 % IV SOLN
200.0000 mg | Freq: Once | INTRAVENOUS | Status: AC
  Administered 2019-03-26: 200 mg via INTRAVENOUS
  Filled 2019-03-26: qty 8

## 2019-03-26 MED ORDER — ONDANSETRON HCL 4 MG/2ML IJ SOLN
INTRAMUSCULAR | Status: AC
  Filled 2019-03-26: qty 4

## 2019-03-26 NOTE — Patient Instructions (Signed)
[facility] Discharge Instructions for Patients Receiving Chemotherapy  Today you received the following chemotherapy agents Keytruda; Alimta  To help prevent nausea and vomiting after your treatment, we encourage you to take your nausea medication as directed   If you develop nausea and vomiting that is not controlled by your nausea medication, call the clinic.   BELOW ARE SYMPTOMS THAT SHOULD BE REPORTED IMMEDIATELY:  *FEVER GREATER THAN 100.5 F  *CHILLS WITH OR WITHOUT FEVER  NAUSEA AND VOMITING THAT IS NOT CONTROLLED WITH YOUR NAUSEA MEDICATION  *UNUSUAL SHORTNESS OF BREATH  *UNUSUAL BRUISING OR BLEEDING  TENDERNESS IN MOUTH AND THROAT WITH OR WITHOUT PRESENCE OF ULCERS  *URINARY PROBLEMS  *BOWEL PROBLEMS  UNUSUAL RASH Items with * indicate a potential emergency and should be followed up as soon as possible.  Feel free to call the clinic should you have any questions or concerns. The clinic phone number is [phone].  Please show the CHEMO ALERT CARD at check-in to the Emergency Department and triage nurse.   

## 2019-03-26 NOTE — Progress Notes (Signed)
[facility] [phone]   [phone]  OFFICE PROGRESS NOTE  Patient, No Pcp Per No address on file  DIAGNOSIS: Metastatic non-small cell lung cancer, adenocarcinoma initially diagnosed as stage IB (T2a, N0, M0) non-small cell lung cancer, adenocarcinoma diagnosed in December 2017.  The patient has evidence for disease recurrence in July 2019.  Biomarker Findings Tumor Mutational Burden - TMB-Intermediate (16 Muts/Mb) Microsatellite status - MS-Stable Genomic Findings For a complete list of the genes assayed, please refer to the Appendix. KIT amplification KRAS V77L SMAD4 splice site [id number]>P QZ30 I232F 7 Disease relevant genes with no reportable alterations: EGFR, ALK, BRAF, MET, RET, ERBB2, ROS1   PDL 1 expression is 0%  PRIOR THERAPY:  1) status post left lower lobectomy as well as wedge resection of the left upper lobe on 05/11/2016. 2) Adjuvant systemic chemotherapy with cisplatin 75 MG/M2 and Alimta 500 MG/M2 every 3 weeks. First dose 07/03/2016. Status post 4 cycles.  CURRENT THERAPY: Systemic chemotherapy with carboplatin for AUC of 5, Alimta 500 mg/M2 and Keytruda 200 mg IV every 3 weeks.  Status post 22 cycles.  Starting from cycle #5 the patient will be treated with maintenance Alimta and Ketruda (pembrolizumab) every 3 weeks.  INTERVAL HISTORY: William Kales [name] 63 y.o. male returns to the clinic today for follow-up visit.  The patient is feeling fine today with no concerning complaints.  His family members including his wife recovered from the recent Covid infection.  The patient was tested negative twice during this.Marland Kitchen  He is feeling fine today.  He denied having any chest pain, shortness of breath, cough or hemoptysis.  He denied having any fever or chills.  He has no nausea, vomiting, diarrhea or constipation.  He has no headache or visual changes.  He is here today for evaluation before starting cycle #23.   MEDICAL  HISTORY: Past Medical History:  Diagnosis Date  . AAA (abdominal aortic aneurysm) ([place])   . AAA (abdominal aortic aneurysm) without rupture ([place]) 02/04/2014  . Cancer ([place])    lung  . Encounter for antineoplastic chemotherapy 06/06/2016  . History of hepatitis B   . HNP (herniated nucleus pulposus), lumbar    L4 with radiculopathy  . Hypertension   . Lung cancer ([place]) 05/18/2016  . Malignant neoplasm of lower lobe of left lung ([place]) 04/05/2016  . Mass of left lung   . Mitral insufficiency 04/06/2016   This patient will eventually need MV repair if his prognosis is good from oncology standpoint. However, his lung cancer therapy  is the priority right now. His cardiac condition won't preclude possible lung surgery.  Once his lung cancer is under control he will need a TEE to further evaluate his mitral valve anatomy and MR severity, and also have ischemic workup as tere is evidence on calcificati  . Renal insufficiency 10/09/2016  . S/P partial lobectomy of lung 05/11/2016  . Varicose veins of legs     ALLERGIES:  is allergic to tramadol.  MEDICATIONS:  Current Outpatient Medications  Medication Sig Dispense Refill  . folic acid (FOLVITE) 1 MG tablet TAKE 1 TABLET BY MOUTH EVERY DAY 90 tablet 1  . lisinopril (PRINIVIL,ZESTRIL) 2.5 MG tablet Take 1 tablet (2.5 mg total) by mouth daily. 30 tablet 11   No current facility-administered medications for this visit.     SURGICAL HISTORY:  Past Surgical History:  Procedure Laterality Date  . ABDOMINAL AORTIC ENDOVASCULAR STENT GRAFT N/A 03/12/2016  Procedure: ABDOMINAL AORTIC ENDOVASCULAR STENT GRAFT;  Surgeon: Brian L Chen, MD;  [facility];  Service: Vascular;  Laterality: N/A;  . COLONOSCOPY    . INGUINAL HERNIA REPAIR Left 1975   Left inguinal hernia  . INGUINAL HERNIA REPAIR Right   . LOBECTOMY Left 05/11/2016   Procedure: LEFT LOWER LOBE LOBECTOMY AND LEFT UPPER LOBE  RESECTION AND PLACEMENT OF ON-Q;  Surgeon: Edward B  Gerhardt, MD;  [facility];  Service: Thoracic;  Laterality: Left;  . LUMBAR LAMINECTOMY  October 22, 2012  . LYMPH NODE DISSECTION Left 05/11/2016   Procedure: LYMPH NODE DISSECTION;  Surgeon: Edward B Gerhardt, MD;  [facility];  Service: Thoracic;  Laterality: Left;  . STAPLING OF BLEBS Left 05/11/2016   Procedure: STAPLING OF APICAL BLEB;  Surgeon: Edward B Gerhardt, MD;  [facility];  Service: Thoracic;  Laterality: Left;  . VIDEO ASSISTED THORACOSCOPY Left 05/18/2016   Procedure: VIDEO ASSISTED THORACOSCOPY WITH REMOVAL OF LEFT APICAL BLEB;  Surgeon: Edward B Gerhardt, MD;  [facility];  Service: Thoracic;  Laterality: Left;  . VIDEO ASSISTED THORACOSCOPY (VATS)/WEDGE RESECTION Left 05/11/2016   Procedure: LEFT VIDEO ASSISTED THORACOSCOPY (VATS);  Surgeon: Edward B Gerhardt, MD;  [facility];  Service: Thoracic;  Laterality: Left;  . VIDEO BRONCHOSCOPY N/A 05/11/2016   Procedure: VIDEO BRONCHOSCOPY, LEFT LUNG;  Surgeon: Edward B Gerhardt, MD;  [facility];  Service: Thoracic;  Laterality: N/A;  . VIDEO BRONCHOSCOPY N/A 05/18/2016   Procedure: VIDEO BRONCHOSCOPY WITH BRONCHIAL WASHING;  Surgeon: Edward B Gerhardt, MD;  [facility];  Service: Thoracic;  Laterality: N/A;    REVIEW OF SYSTEMS:  A comprehensive review of systems was negative.   PHYSICAL EXAMINATION: General appearance: alert, cooperative and no distress Head: Normocephalic, without obvious abnormality, atraumatic Neck: no adenopathy, no JVD, supple, symmetrical, trachea midline and thyroid not enlarged, symmetric, no tenderness/mass/nodules Lymph nodes: Cervical, supraclavicular, and axillary nodes normal. Resp: clear to auscultation bilaterally Back: symmetric, no curvature. ROM normal. No CVA tenderness. Cardio: regular rate and rhythm, S1, S2 normal, no murmur, click, rub or gallop GI: soft, non-tender; bowel sounds normal; no masses,  no organomegaly Extremities: extremities normal, atraumatic, no  cyanosis or edema   ECOG PERFORMANCE STATUS: 1 - Symptomatic but completely ambulatory  Blood pressure (!) 147/94, pulse (!) 124, temperature 98.2 F (36.8 C), temperature source Temporal, resp. rate 19, height 6' 3" (1.905 m), weight 163 lb 4.8 oz (74.1 kg), SpO2 94 %.  LABORATORY DATA: Lab Results  Component Value Date   WBC 6.5 03/06/2019   HGB 13.0 03/06/2019   HCT 40.2 03/06/2019   MCV 100.8 (H) 03/06/2019   PLT 466 (H) 03/06/2019      Chemistry      Component Value Date/Time   NA 139 03/06/2019 0833   NA 138 01/11/2017 1343   K 4.1 03/06/2019 0833   K 4.7 01/11/2017 1343   CL 107 03/06/2019 0833   CO2 20 (L) 03/06/2019 0833   CO2 23 01/11/2017 1343   BUN 10 03/06/2019 0833   BUN 20.2 01/11/2017 1343   CREATININE 1.29 (H) 03/06/2019 0833   CREATININE 1.6 (H) 01/11/2017 1343      Component Value Date/Time   CALCIUM 9.1 03/06/2019 0833   CALCIUM 9.3 01/11/2017 1343   ALKPHOS 84 03/06/2019 0833   ALKPHOS 89 01/11/2017 1343   AST 39 03/06/2019 0833   AST 41 (H) 01/11/2017 1343   ALT 31 03/06/2019 0833   ALT   27 01/11/2017 1343   BILITOT 0.4 03/06/2019 0833   BILITOT 0.49 01/11/2017 1343       RADIOGRAPHIC STUDIES: No results found.  ASSESSMENT AND PLAN:  This is a very pleasant 63 years old white male with a stage IB non-small cell lung cancer, adenocarcinoma status post wedge resection of the left upper lobe followed by 4 cycles of adjuvant systemic chemotherapy with cisplatin and Alimta and he tolerated his treatment well except for fatigue. The patient has been on observation since June 2018.   The recent imaging studies including CT scan of the chest as well as a PET scan showed evidence for disease recurrence with metastatic disease presented with bilateral pulmonary nodules as well as destructive bone lesions at T4 vertebral body, biopsy proven to be metastatic adenocarcinoma. Molecular studies by foundation 1 showed no actionable mutations.  PDL 1  expression was negative. The patient is currently undergoing treatment with carboplatin, Alimta and Ketruda (pembrolizumab) status post 22 cycles.  Starting from cycle #5 he is on maintenance treatment with Alimta and Keytruda every 3 weeks. The patient continues to tolerate his treatment well with no concerning adverse effects. I recommended for him to proceed with cycle #23 today as planned. I will see him back for follow-up visit in 3 weeks for evaluation with repeat CT scan of the chest, abdomen and pelvis for restaging of his disease. For hypertension he will continue to monitor his blood pressure closely at home. The patient was advised to call immediately if he has any concerning symptoms in the interval. The patient voices understanding of current disease status and treatment options and is in agreement with the current care plan. All questions were answered. The patient knows to call the clinic with any problems, questions or concerns. We can certainly see the patient much sooner if necessary.  Disclaimer: This note was dictated with voice recognition software. Similar sounding words can inadvertently be transcribed and may not be corrected upon review.

## 2019-04-14 ENCOUNTER — Encounter (HOSPITAL_COMMUNITY): Payer: Self-pay

## 2019-04-14 ENCOUNTER — Other Ambulatory Visit: Payer: Self-pay

## 2019-04-14 ENCOUNTER — Ambulatory Visit (HOSPITAL_COMMUNITY)
Admission: RE | Admit: 2019-04-14 | Discharge: 2019-04-14 | Disposition: A | Discharge status: Home / Self Care | Payer: No Typology Code available for payment source | Source: Ambulatory Visit | Attending: Internal Medicine | Admitting: Internal Medicine

## 2019-04-14 DIAGNOSIS — C3490 Malignant neoplasm of unspecified part of unspecified bronchus or lung: Secondary | ICD-10-CM

## 2019-04-14 MED ORDER — IOHEXOL 300 MG/ML  SOLN
100.0000 mL | Freq: Once | INTRAMUSCULAR | Status: AC | PRN
  Administered 2019-04-14: 100 mL via INTRAVENOUS

## 2019-04-14 MED ORDER — SODIUM CHLORIDE (PF) 0.9 % IJ SOLN
INTRAMUSCULAR | Status: AC
  Filled 2019-04-14: qty 50

## 2019-04-16 ENCOUNTER — Other Ambulatory Visit: Payer: Self-pay

## 2019-04-16 ENCOUNTER — Inpatient Hospital Stay: Payer: No Typology Code available for payment source

## 2019-04-16 ENCOUNTER — Inpatient Hospital Stay (HOSPITAL_BASED_OUTPATIENT_CLINIC_OR_DEPARTMENT_OTHER): Payer: No Typology Code available for payment source | Admitting: Internal Medicine

## 2019-04-16 ENCOUNTER — Encounter: Payer: Self-pay | Admitting: Internal Medicine

## 2019-04-16 VITALS — BP 155/87 | HR 111 | Temp 97.9°F | Resp 18 | Ht 75.0 in | Wt 162.5 lb

## 2019-04-16 DIAGNOSIS — C3492 Malignant neoplasm of unspecified part of left bronchus or lung: Secondary | ICD-10-CM

## 2019-04-16 DIAGNOSIS — C3432 Malignant neoplasm of lower lobe, left bronchus or lung: Secondary | ICD-10-CM

## 2019-04-16 DIAGNOSIS — Z5112 Encounter for antineoplastic immunotherapy: Secondary | ICD-10-CM

## 2019-04-16 DIAGNOSIS — Z5111 Encounter for antineoplastic chemotherapy: Secondary | ICD-10-CM

## 2019-04-16 LAB — CBC WITH DIFFERENTIAL (CANCER CENTER ONLY)
Abs Immature Granulocytes: 0.03 10*3/uL (ref 0.00–0.07)
Basophils Absolute: 0.1 10*3/uL (ref 0.0–0.1)
Basophils Relative: 1 %
Eosinophils Absolute: 0.1 10*3/uL (ref 0.0–0.5)
Eosinophils Relative: 2 %
HCT: 39.4 % (ref 39.0–52.0)
Hemoglobin: 13.2 g/dL (ref 13.0–17.0)
Immature Granulocytes: 0 %
Lymphocytes Relative: 10 %
Lymphs Abs: 0.8 10*3/uL (ref 0.7–4.0)
MCH: 34.3 pg — ABNORMAL HIGH (ref 26.0–34.0)
MCHC: 33.5 g/dL (ref 30.0–36.0)
MCV: 102.3 fL — ABNORMAL HIGH (ref 80.0–100.0)
Monocytes Absolute: 1 10*3/uL (ref 0.1–1.0)
Monocytes Relative: 14 %
Neutro Abs: 5.6 10*3/uL (ref 1.7–7.7)
Neutrophils Relative %: 73 %
Platelet Count: 483 10*3/uL — ABNORMAL HIGH (ref 150–400)
RBC: 3.85 MIL/uL — ABNORMAL LOW (ref 4.22–5.81)
RDW: 15.9 % — ABNORMAL HIGH (ref 11.5–15.5)
WBC Count: 7.6 10*3/uL (ref 4.0–10.5)
nRBC: 0 % (ref 0.0–0.2)

## 2019-04-16 LAB — CMP (CANCER CENTER ONLY)
ALT: 14 U/L (ref 0–44)
AST: 32 U/L (ref 15–41)
Albumin: 3.4 g/dL — ABNORMAL LOW (ref 3.5–5.0)
Alkaline Phosphatase: 88 U/L (ref 38–126)
Anion gap: 10 (ref 5–15)
BUN: 9 mg/dL (ref 8–23)
CO2: 19 mmol/L — ABNORMAL LOW (ref 22–32)
Calcium: 8.8 mg/dL — ABNORMAL LOW (ref 8.9–10.3)
Chloride: 107 mmol/L (ref 98–111)
Creatinine: 1.24 mg/dL (ref 0.61–1.24)
GFR, Est AFR Am: >60 mL/min (ref >60)
GFR, Estimated: >60 mL/min (ref >60)
Glucose, Bld: 115 mg/dL — ABNORMAL HIGH (ref 70–99)
Potassium: 3.6 mmol/L (ref 3.5–5.1)
Sodium: 136 mmol/L (ref 135–145)
Total Bilirubin: 0.5 mg/dL (ref 0.3–1.2)
Total Protein: 6.9 g/dL (ref 6.5–8.1)

## 2019-04-16 LAB — TSH: TSH: 2.25 u[IU]/mL (ref 0.320–4.118)

## 2019-04-16 MED ORDER — ONDANSETRON HCL 4 MG/2ML IJ SOLN
8.0000 mg | Freq: Once | INTRAMUSCULAR | Status: AC
  Administered 2019-04-16: 13:00:00 8 mg via INTRAVENOUS

## 2019-04-16 MED ORDER — SODIUM CHLORIDE 0.9 % IV SOLN
520.0000 mg/m2 | Freq: Once | INTRAVENOUS | Status: AC
  Administered 2019-04-16: 15:00:00 1000 mg via INTRAVENOUS
  Filled 2019-04-16: qty 40

## 2019-04-16 MED ORDER — CYANOCOBALAMIN 1000 MCG/ML IJ SOLN
1000.0000 ug | Freq: Once | INTRAMUSCULAR | Status: AC
  Administered 2019-04-16: 1000 ug via INTRAMUSCULAR

## 2019-04-16 MED ORDER — SODIUM CHLORIDE 0.9 % IV SOLN
Freq: Once | INTRAVENOUS | Status: AC
  Filled 2019-04-16: qty 250

## 2019-04-16 MED ORDER — ONDANSETRON HCL 4 MG/2ML IJ SOLN
INTRAMUSCULAR | Status: AC
  Filled 2019-04-16: qty 4

## 2019-04-16 MED ORDER — CYANOCOBALAMIN 1000 MCG/ML IJ SOLN
INTRAMUSCULAR | Status: AC
  Filled 2019-04-16: qty 1

## 2019-04-16 MED ORDER — SODIUM CHLORIDE 0.9 % IV SOLN
200.0000 mg | Freq: Once | INTRAVENOUS | Status: AC
  Administered 2019-04-16: 200 mg via INTRAVENOUS
  Filled 2019-04-16: qty 8

## 2019-04-16 MED ORDER — DEXAMETHASONE SODIUM PHOSPHATE 10 MG/ML IJ SOLN
10.0000 mg | Freq: Once | INTRAMUSCULAR | Status: AC
  Administered 2019-04-16: 10 mg via INTRAVENOUS

## 2019-04-16 NOTE — Patient Instructions (Signed)
Pembrolizumab injection What is this medicine? PEMBROLIZUMAB (pem broe liz ue mab) is a monoclonal antibody. It is used to treat bladder cancer, cervical cancer, endometrial cancer, esophageal cancer, head and neck cancer, hepatocellular cancer, Hodgkin lymphoma, kidney cancer, lymphoma, melanoma, Merkel cell carcinoma, lung cancer, stomach cancer, urothelial cancer, and cancers that have a certain genetic condition. This medicine may be used for other purposes; ask your health care provider or pharmacist if you have questions. COMMON BRAND NAME(S): Keytruda What should I tell my health care provider before I take this medicine? They need to know if you have any of these conditions:  diabetes  immune system problems  inflammatory bowel disease  liver disease  lung or breathing disease  lupus  received or scheduled to receive an organ transplant or a stem-cell transplant that uses donor stem cells  an unusual or allergic reaction to pembrolizumab, other medicines, foods, dyes, or preservatives  pregnant or trying to get pregnant  breast-feeding How should I use this medicine? This medicine is for infusion into a vein. It is given by a health care professional in a hospital or clinic setting. A special MedGuide will be given to you before each treatment. Be sure to read this information carefully each time. Talk to your pediatrician regarding the use of this medicine in children. While this drug may be prescribed for selected conditions, precautions do apply. Overdosage: If you think you have taken too much of this medicine contact a poison control center or emergency room at once. NOTE: This medicine is only for you. Do not share this medicine with others. What if I miss a dose? It is important not to miss your dose. Call your doctor or health care professional if you are unable to keep an appointment. What may interact with this medicine? Interactions have not been studied. Give  your health care provider a list of all the medicines, herbs, non-prescription drugs, or dietary supplements you use. Also tell them if you smoke, drink alcohol, or use illegal drugs. Some items may interact with your medicine. This list may not describe all possible interactions. Give your health care provider a list of all the medicines, herbs, non-prescription drugs, or dietary supplements you use. Also tell them if you smoke, drink alcohol, or use illegal drugs. Some items may interact with your medicine. What should I watch for while using this medicine? Your condition will be monitored carefully while you are receiving this medicine. You may need blood work done while you are taking this medicine. Do not become pregnant while taking this medicine or for 4 months after stopping it. Women should inform their doctor if they wish to become pregnant or think they might be pregnant. There is a potential for serious side effects to an unborn child. Talk to your health care professional or pharmacist for more information. Do not breast-feed an infant while taking this medicine or for 4 months after the last dose. What side effects may I notice from receiving this medicine? Side effects that you should report to your doctor or health care professional as soon as possible:  allergic reactions like skin rash, itching or hives, swelling of the face, lips, or tongue  bloody or black, tarry  breathing problems  changes in vision  chest pain  chills  confusion  constipation  cough  diarrhea  dizziness or feeling faint or lightheaded  fast or irregular heartbeat  fever  flushing  hair loss  joint pain  low blood counts - this  medicine may decrease the number of white blood cells, red blood cells and platelets. You may be at increased risk for infections and bleeding.  muscle pain  muscle weakness  persistent headache  redness, blistering, peeling or loosening of the skin,  including inside the mouth  signs and symptoms of high blood sugar such as dizziness; dry mouth; dry skin; fruity breath; nausea; stomach pain; increased hunger or thirst; increased urination  signs and symptoms of kidney injury like trouble passing urine or change in the amount of urine  signs and symptoms of liver injury like dark urine, light-colored stools, loss of appetite, nausea, right upper belly pain, yellowing of the eyes or skin  sweating  swollen lymph nodes  weight loss Side effects that usually do not require medical attention (report to your doctor or health care professional if they continue or are bothersome):  decreased appetite  muscle pain  tiredness This list may not describe all possible side effects. Call your doctor for medical advice about side effects. You may report side effects to FDA at 1-800-FDA-1088. Where should I keep my medicine? This drug is given in a hospital or clinic and will not be stored at home. NOTE: This sheet is a summary. It may not cover all possible information. If you have questions about this medicine, talk to your doctor, pharmacist, or health care provider.  2020 [employer] Standard (2018-05-06 13:46:58) Pemetrexed injection What is this medicine? PEMETREXED (PEM e TREX ed) is a chemotherapy drug used to treat lung cancers like non-small cell lung cancer and mesothelioma. It may also be used to treat other cancers. This medicine may be used for other purposes; ask your health care provider or pharmacist if you have questions. COMMON BRAND NAME(S): Alimta What should I tell my health care provider before I take this medicine? They need to know if you have any of these conditions:  infection (especially a virus infection such as chickenpox, cold sores, or herpes)  kidney disease  low blood counts, like low white cell, platelet, or red cell counts  lung or breathing disease, like asthma  radiation therapy  an unusual or  allergic reaction to pemetrexed, other medicines, foods, dyes, or preservative  pregnant or trying to get pregnant  breast-feeding How should I use this medicine? This drug is given as an infusion into a vein. It is administered in a hospital or clinic by a specially trained health care professional. Talk to your pediatrician regarding the use of this medicine in children. Special care may be needed. Overdosage: If you think you have taken too much of this medicine contact a poison control center or emergency room at once. NOTE: This medicine is only for you. Do not share this medicine with others. What if I miss a dose? It is important not to miss your dose. Call your doctor or health care professional if you are unable to keep an appointment. What may interact with this medicine? This medicine may interact with the following medications:  Ibuprofen This list may not describe all possible interactions. Give your health care provider a list of all the medicines, herbs, non-prescription drugs, or dietary supplements you use. Also tell them if you smoke, drink alcohol, or use illegal drugs. Some items may interact with your medicine. What should I watch for while using this medicine? Visit your doctor for checks on your progress. This drug may make you feel generally unwell. This is not uncommon, as chemotherapy can affect healthy cells as well as  cancer cells. Report any side effects. Continue your course of treatment even though you feel ill unless your doctor tells you to stop. In some cases, you may be given additional medicines to help with side effects. Follow all directions for their use. Call your doctor or health care professional for advice if you get a fever, chills or sore throat, or other symptoms of a cold or flu. Do not treat yourself. This drug decreases your body's ability to fight infections. Try to avoid being around people who are sick. This medicine may increase your risk to  bruise or bleed. Call your doctor or health care professional if you notice any unusual bleeding. Be careful brushing and flossing your teeth or using a toothpick because you may get an infection or bleed more easily. If you have any dental work done, tell your dentist you are receiving this medicine. Avoid taking products that contain aspirin, acetaminophen, ibuprofen, naproxen, or ketoprofen unless instructed by your doctor. These medicines may hide a fever. Call your doctor or health care professional if you get diarrhea or mouth sores. Do not treat yourself. To protect your kidneys, drink water or other fluids as directed while you are taking this medicine. Do not become pregnant while taking this medicine or for 6 months after stopping it. Women should inform their doctor if they wish to become pregnant or think they might be pregnant. Men should not father a child while taking this medicine and for 3 months after stopping it. This may interfere with the ability to father a child. You should talk to your doctor or health care professional if you are concerned about your fertility. There is a potential for serious side effects to an unborn child. Talk to your health care professional or pharmacist for more information. Do not breast-feed an infant while taking this medicine or for 1 week after stopping it. What side effects may I notice from receiving this medicine? Side effects that you should report to your doctor or health care professional as soon as possible:  allergic reactions like skin rash, itching or hives, swelling of the face, lips, or tongue  breathing problems  redness, blistering, peeling or loosening of the skin, including inside the mouth  signs and symptoms of bleeding such as bloody or black, tarry stools; red or dark-brown urine; spitting up blood or brown material that looks like coffee grounds; red spots on the skin; unusual bruising or bleeding from the eye, gums, or  nose  signs and symptoms of infection like fever or chills; cough; sore throat; pain or trouble passing urine  signs and symptoms of kidney injury like trouble passing urine or change in the amount of urine  signs and symptoms of liver injury like dark yellow or brown urine; general ill feeling or flu-like symptoms; light-colored stools; loss of appetite; nausea; right upper belly pain; unusually weak or tired; yellowing of the eyes or skin Side effects that usually do not require medical attention (report to your doctor or health care professional if they continue or are bothersome):  constipation  mouth sores  nausea, vomiting  unusually weak or tired This list may not describe all possible side effects. Call your doctor for medical advice about side effects. You may report side effects to FDA at 1-800-FDA-1088. Where should I keep my medicine? This drug is given in a hospital or clinic and will not be stored at home. NOTE: This sheet is a summary. It may not cover all possible information. If  you have questions about this medicine, talk to your doctor, pharmacist, or health care provider.  2020 [employer] Standard (2017-05-29 16:11:33)

## 2019-04-16 NOTE — Addendum Note (Signed)
Addended by: Lin Givens on: 04/16/2019 03:14 PM   Modules accepted: Orders

## 2019-04-16 NOTE — Progress Notes (Signed)
[place] [phone]   [phone]  OFFICE PROGRESS NOTE  Patient, No Pcp Per No address on file  DIAGNOSIS: Metastatic non-small cell lung cancer, adenocarcinoma initially diagnosed as stage IB (T2a, N0, M0) non-small cell lung cancer, adenocarcinoma diagnosed in December 2017.  The patient has evidence for disease recurrence in July 2019.  Biomarker Findings Tumor Mutational Burden - TMB-Intermediate (16 Muts/Mb) Microsatellite status - MS-Stable Genomic Findings For a complete list of the genes assayed, please refer to the Appendix. KIT amplification KRAS J88C SMAD4 splice site 1660-6T>K ZS01 I232F 7 Disease relevant genes with no reportable alterations: EGFR, ALK, BRAF, MET, RET, ERBB2, ROS1   PDL 1 expression is 0%  PRIOR THERAPY:  1) status post left lower lobectomy as well as wedge resection of the left upper lobe on 05/11/2016. 2) Adjuvant systemic chemotherapy with cisplatin 75 MG/M2 and Alimta 500 MG/M2 every 3 weeks. First dose 07/03/2016. Status post 4 cycles.  CURRENT THERAPY: Systemic chemotherapy with carboplatin for AUC of 5, Alimta 500 mg/M2 and Keytruda 200 mg IV every 3 weeks.  Status post 23 cycles.  Starting from cycle #5 the patient will be treated with maintenance Alimta and Ketruda (pembrolizumab) every 3 weeks.  INTERVAL HISTORY: William Kales [name] 63 y.o. male returns to the clinic today for follow-up visit.  The patient is feeling fine today with no concerning complaints.  His blood pressure has been elevated recently and his primary care physician has retired.  He is in the process of finding another primary care physician.  He denied having any current chest pain, shortness of breath, cough or hemoptysis.  He denied having any fever or chills.  He has no nausea, vomiting, diarrhea or constipation.  He denied having any headache or visual changes.  The patient continues to tolerate his maintenance treatment with  Alimta and Keytruda fairly well.  He had repeat CT scan of the chest, abdomen pelvis performed recently and he is here for evaluation and discussion of his discuss results.  MEDICAL HISTORY: Past Medical History:  Diagnosis Date  . AAA (abdominal aortic aneurysm) ([place])   . AAA (abdominal aortic aneurysm) without rupture ([place]) 02/04/2014  . Cancer ([place])    lung  . Encounter for antineoplastic chemotherapy 06/06/2016  . History of hepatitis B   . HNP (herniated nucleus pulposus), lumbar    L4 with radiculopathy  . Hypertension   . Lung cancer ([place]) 05/18/2016  . Malignant neoplasm of lower lobe of left lung ([place]) 04/05/2016  . Mass of left lung   . Mitral insufficiency 04/06/2016   This patient will eventually need MV repair if his prognosis is good from oncology standpoint. However, his lung cancer therapy  is the priority right now. His cardiac condition won't preclude possible lung surgery.  Once his lung cancer is under control he will need a TEE to further evaluate his mitral valve anatomy and MR severity, and also have ischemic workup as tere is evidence on calcificati  . Renal insufficiency 10/09/2016  . S/P partial lobectomy of lung 05/11/2016  . Varicose veins of legs     ALLERGIES:  is allergic to tramadol.  MEDICATIONS:  Current Outpatient Medications  Medication Sig Dispense Refill  . folic acid (FOLVITE) 1 MG tablet TAKE 1 TABLET BY MOUTH EVERY DAY 90 tablet 1  . lisinopril (PRINIVIL,ZESTRIL) 2.5 MG tablet Take 1 tablet (2.5 mg total) by mouth daily. 30 tablet 11   No  current facility-administered medications for this visit.    SURGICAL HISTORY:  Past Surgical History:  Procedure Laterality Date  . ABDOMINAL AORTIC ENDOVASCULAR STENT GRAFT N/A 03/12/2016   Procedure: ABDOMINAL AORTIC ENDOVASCULAR STENT GRAFT;  Surgeon: Conrad McLoud, MD;  Location: [facility];  Service: Vascular;  Laterality: N/A;  . COLONOSCOPY    . INGUINAL HERNIA REPAIR Left 1975   Left inguinal hernia    . INGUINAL HERNIA REPAIR Right   . LOBECTOMY Left 05/11/2016   Procedure: LEFT LOWER LOBE LOBECTOMY AND LEFT UPPER LOBE  RESECTION AND PLACEMENT OF ON-Q;  Surgeon: [name], MD;  Location: [place] [facility];  Service: Thoracic;  Laterality: Left;  . LUMBAR LAMINECTOMY  October 22, 2012  . LYMPH NODE DISSECTION Left 05/11/2016   Procedure: LYMPH NODE DISSECTION;  Surgeon: [name], MD;  Location: [facility];  Service: Thoracic;  Laterality: Left;  . STAPLING OF BLEBS Left 05/11/2016   Procedure: STAPLING OF APICAL BLEB;  Surgeon: [name], MD;  Location: [place];  Service: Thoracic;  Laterality: Left;  Marland Kitchen VIDEO ASSISTED THORACOSCOPY Left 05/18/2016   Procedure: VIDEO ASSISTED THORACOSCOPY WITH REMOVAL OF LEFT APICAL BLEB;  Surgeon: [name], MD;  Location: [place];  Service: Thoracic;  Laterality: Left;  Marland Kitchen VIDEO ASSISTED THORACOSCOPY (VATS)/WEDGE RESECTION Left 05/11/2016   Procedure: LEFT VIDEO ASSISTED THORACOSCOPY (VATS);  Surgeon: [name], MD;  Location: [facility];  Service: Thoracic;  Laterality: Left;  Marland Kitchen VIDEO BRONCHOSCOPY N/A 05/11/2016   Procedure: VIDEO BRONCHOSCOPY, LEFT LUNG;  Surgeon: [name], MD;  Location: [place];  Service: Thoracic;  Laterality: N/A;  . VIDEO BRONCHOSCOPY N/A 05/18/2016   Procedure: VIDEO BRONCHOSCOPY WITH BRONCHIAL WASHING;  Surgeon: [name], MD;  Location: [place];  Service: Thoracic;  Laterality: N/A;    REVIEW OF SYSTEMS:  Constitutional: negative Eyes: negative Ears, nose, mouth, throat, and face: negative Respiratory: negative Cardiovascular: negative Gastrointestinal: negative Genitourinary:negative Integument/breast: negative Hematologic/lymphatic: negative Musculoskeletal:negative Neurological: negative Behavioral/Psych: negative Endocrine: negative Allergic/Immunologic: negative   PHYSICAL EXAMINATION: General appearance: alert, cooperative and no distress Head: Normocephalic, without obvious abnormality,  atraumatic Neck: no adenopathy, no JVD, supple, symmetrical, trachea midline and thyroid not enlarged, symmetric, no tenderness/mass/nodules Lymph nodes: Cervical, supraclavicular, and axillary nodes normal. Resp: clear to auscultation bilaterally Back: symmetric, no curvature. ROM normal. No CVA tenderness. Cardio: regular rate and rhythm, S1, S2 normal, no murmur, click, rub or gallop GI: soft, non-tender; bowel sounds normal; no masses,  no organomegaly Extremities: extremities normal, atraumatic, no cyanosis or edema Neurologic: Alert and oriented X 3, normal strength and tone. Normal symmetric reflexes. Normal coordination and gait   ECOG PERFORMANCE STATUS: 1 - Symptomatic but completely ambulatory  Blood pressure (!) 155/87, pulse (!) 111, temperature 97.9 F (36.6 C), temperature source Temporal, resp. rate 18, height _0  (1.905 m), weight 162 lb 8 oz (73.7 kg), SpO2 92 %.  LABORATORY DATA: Lab Results  Component Value Date   WBC 9.3 03/26/2019   HGB 13.6 03/26/2019   HCT 41.2 03/26/2019   MCV 101.2 (H) 03/26/2019   PLT 480 (H) 03/26/2019      Chemistry      Component Value Date/Time   NA 138 03/26/2019 1202   NA 138 01/11/2017 1343   K 3.8 03/26/2019 1202   K 4.7 01/11/2017 1343   CL 105 03/26/2019 1202   CO2 20 (L) 03/26/2019 1202   CO2 23 01/11/2017 1343   BUN 10 03/26/2019 1202  BUN 20.2 01/11/2017 1343   CREATININE 1.22 03/26/2019 1202   CREATININE 1.6 (H) 01/11/2017 1343      Component Value Date/Time   CALCIUM 9.3 03/26/2019 1202   CALCIUM 9.3 01/11/2017 1343   ALKPHOS 99 03/26/2019 1202   ALKPHOS 89 01/11/2017 1343   AST 38 03/26/2019 1202   AST 41 (H) 01/11/2017 1343   ALT 21 03/26/2019 1202   ALT 27 01/11/2017 1343   BILITOT 0.6 03/26/2019 1202   BILITOT 0.49 01/11/2017 1343       RADIOGRAPHIC STUDIES: CT Chest W Contrast  Result Date: 04/14/2019 CLINICAL DATA:  63 year old male with history of non-small cell lung cancer. Chemotherapy  ongoing. Radiation therapy complete June 2020. Follow-up evaluation. EXAM: CT CHEST, ABDOMEN, AND PELVIS WITH CONTRAST TECHNIQUE: Multidetector CT imaging of the chest, abdomen and pelvis was performed following the standard protocol during bolus administration of intravenous contrast. CONTRAST:  167m OMNIPAQUE IOHEXOL 300 MG/ML  SOLN COMPARISON:  CT the chest, abdomen and pelvis 01/20/2019. FINDINGS: CT CHEST FINDINGS Cardiovascular: Heart size is borderline enlarged. There is no significant pericardial fluid, thickening or pericardial calcification. Aortic atherosclerosis. No definite coronary artery calcifications. Mediastinum/Nodes: No pathologically enlarged mediastinal or hilar lymph nodes. Esophagus is unremarkable in appearance. No axillary lymphadenopathy. Lungs/Pleura: Calcified pleural plaques in the thorax bilaterally (right greater than left), compatible with asbestos related pleural disease. 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 Postoperative changes of left lower lobectomy with compensatory hyperexpansion of the left upper lobe. Previously noted pulmonary nodule in the periphery of the left upper lobe has significantly regressed and nearly completely resolved (axial image 115 of series 6) measuring 6 x 4 mm on today's study (previously 9 mm on 01/20/2019), presumably infectious or inflammatory in etiology. No other new suspicious appearing pulmonary nodules or masses are noted. Diffuse bronchial wall thickening with moderate centrilobular and paraseptal emphysema. Musculoskeletal: Expansile mixed lucent and sclerotic lesion in the posterior elements of the T4 vertebra, similar to prior studies measuring 2.7 x 2.3 cm on today's examination,  compatible with a treated metastatic lesion. There are no other new aggressive appearing lytic or blastic lesions noted in the visualized portions of the skeleton. CT ABDOMEN PELVIS FINDINGS Hepatobiliary: A few subcentimeter low-attenuation lesions in the liver are too small to definitively characterize, but are stable compared to prior studies and favored to represent tiny cysts. No intra or extrahepatic biliary ductal dilatation. Gallbladder is normal in appearance. Pancreas: No pancreatic mass. No pancreatic ductal dilatation. No pancreatic or peripancreatic fluid collections or inflammatory changes. Spleen: Unremarkable. Adrenals/Urinary Tract: Subcentimeter low-attenuation lesions in both kidneys, too small to definitively characterize, but favored to represent tiny cysts. No hydroureteronephrosis. Urinary bladder is unremarkable in appearance. Bilateral adrenal glands are normal in appearance. Stomach/Bowel: Normal appearance of the stomach. No pathologic dilatation of small bowel or colon. Normal appendix. Vascular/Lymphatic: Aortic atherosclerosis with aorto bi-iliac stent graft which is widely patent bilaterally. No lymphadenopathy noted in the abdomen or pelvis. Reproductive: Prostate gland and seminal vesicles are unremarkable in appearance. Other: No significant volume of ascites.  No pneumoperitoneum. Musculoskeletal: There are no aggressive appearing lytic or blastic lesions noted in the visualized portions of the skeleton. IMPRESSION: 1. Status post left lower lobectomy. Previously noted left upper lobe pulmonary nodule has regressed compared to the prior study, presumably of infectious or inflammatory  etiology. No definitive findings to suggest metastatic disease in the chest, abdomen or pelvis. 2. 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 3. Trace bilateral pleural effusions lying dependently. 4. Treated metastatic lesion in the  posterior elements of T4 vertebra, unchanged. 5. Aortic atherosclerosis. 6. Additional incidental findings, as above. Electronically Signed   By: Vinnie Langton M.D.   On: 04/14/2019 09:57   CT Abdomen Pelvis W Contrast  Result Date: 04/14/2019 CLINICAL DATA:  63 year old male with history of non-small cell lung cancer. Chemotherapy ongoing. Radiation therapy complete June 2020. Follow-up evaluation. EXAM: CT CHEST, ABDOMEN, AND PELVIS WITH CONTRAST TECHNIQUE: Multidetector CT imaging of the chest, abdomen and pelvis was performed following the standard protocol during bolus administration of intravenous contrast. CONTRAST:  19m OMNIPAQUE IOHEXOL 300 MG/ML  SOLN COMPARISON:  CT the chest, abdomen and pelvis 01/20/2019. FINDINGS: CT CHEST FINDINGS Cardiovascular: Heart size is borderline enlarged. There is no significant pericardial fluid, thickening or pericardial calcification. Aortic atherosclerosis. No definite coronary artery calcifications. Mediastinum/Nodes: No pathologically enlarged mediastinal or hilar lymph nodes. Esophagus is unremarkable in appearance. No axillary lymphadenopathy. Lungs/Pleura: Calcified pleural plaques in the thorax bilaterally (right greater than left), compatible with asbestos related pleural disease. 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 Postoperative changes of left lower lobectomy with compensatory hyperexpansion of the left upper lobe. Previously noted pulmonary nodule in the periphery of the left upper lobe has significantly regressed and nearly completely resolved (axial image 115 of series 6) measuring 6 x 4 mm on today's study (previously 9 mm on 01/20/2019), presumably infectious or inflammatory in  etiology. No other new suspicious appearing pulmonary nodules or masses are noted. Diffuse bronchial wall thickening with moderate centrilobular and paraseptal emphysema. Musculoskeletal: Expansile mixed lucent and sclerotic lesion in the posterior elements of the T4 vertebra, similar to prior studies measuring 2.7 x 2.3 cm on today's examination, compatible with a treated metastatic lesion. There are no other new aggressive appearing lytic or blastic lesions noted in the visualized portions of the skeleton. CT ABDOMEN PELVIS FINDINGS Hepatobiliary: A few subcentimeter low-attenuation lesions in the liver are too small to definitively characterize, but are stable compared to prior studies and favored to represent tiny cysts. No intra or extrahepatic biliary ductal dilatation. Gallbladder is normal in appearance. Pancreas: No pancreatic mass. No pancreatic ductal dilatation. No pancreatic or peripancreatic fluid collections or inflammatory changes. Spleen: Unremarkable. Adrenals/Urinary Tract: Subcentimeter low-attenuation lesions in both kidneys, too small to definitively characterize, but favored to represent tiny cysts. No hydroureteronephrosis. Urinary bladder is unremarkable in appearance. Bilateral adrenal glands are normal in appearance. Stomach/Bowel: Normal appearance of the stomach. No pathologic dilatation of small bowel or colon. Normal appendix. Vascular/Lymphatic: Aortic atherosclerosis with aorto bi-iliac stent graft which is widely patent bilaterally. No lymphadenopathy noted in the abdomen or pelvis. Reproductive: Prostate gland and seminal vesicles are unremarkable in appearance. Other: No significant volume of ascites.  No pneumoperitoneum. Musculoskeletal: There are no aggressive appearing lytic or blastic lesions noted in the visualized portions of the skeleton. IMPRESSION: 1. Status post left lower lobectomy. Previously noted left upper lobe pulmonary nodule has regressed compared to the prior  study, presumably of infectious or inflammatory etiology. No definitive findings to suggest metastatic disease in the chest, abdomen or pelvis. 2. 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 3. Trace bilateral pleural effusions lying dependently. 4. Treated metastatic lesion in the posterior elements of T4 vertebra, unchanged. 5. Aortic atherosclerosis. 6. Additional incidental findings, as above. Electronically Signed   By: Vinnie Langton M.D.   On: 04/14/2019 09:57    ASSESSMENT AND PLAN:  This is a very pleasant 63 years old white male with a stage IB non-small cell lung cancer, adenocarcinoma status post wedge resection of the left upper lobe followed by 4 cycles of adjuvant systemic chemotherapy with cisplatin and Alimta and he tolerated his treatment well except for fatigue. The patient has been on observation since June 2018.   The recent imaging studies including CT scan of the chest as well as a PET scan showed evidence for disease recurrence with metastatic disease presented with bilateral pulmonary nodules as well as destructive bone lesions at T4 vertebral body, biopsy proven to be metastatic adenocarcinoma. Molecular studies by foundation 1 showed no actionable mutations.  PDL 1 expression was negative. The patient is currently undergoing treatment with carboplatin, Alimta and Ketruda (pembrolizumab) status post 23 cycles.  Starting from cycle #5 he is on maintenance treatment with Alimta and Keytruda every 3 weeks. The patient has been tolerating this treatment well with no concerning adverse effects. He had repeat CT scan of the chest, abdomen pelvis performed recently.  I personally and independently reviewed the scans and discussed the results with the patient today. His scan showed no concerning findings for disease progression. I recommended for the patient to continue his current treatment with maintenance Alimta  and Keytruda and he will proceed with cycle #24 today as planned. I will see the patient back for follow-up visit in 3 weeks for evaluation before starting the next cycle of his treatment. For hypertension, the patient will schedule appointment with a new primary care physician for evaluation and management of his condition. He was advised to call immediately if he has any concerning symptoms in the interval.  Disclaimer: This note was dictated with voice recognition software. Similar sounding words can inadvertently be transcribed and may be missed upon review. Eilleen Kempf, MD 04/16/19  The patient voices understanding of current disease status and treatment options and is in agreement with the current care plan. All questions were answered. The patient knows to call the clinic with any problems, questions or concerns. We can certainly see the patient much sooner if necessary.  Disclaimer: This note was dictated with voice recognition software. Similar sounding words can inadvertently be transcribed and may not be corrected upon review.

## 2019-05-07 ENCOUNTER — Encounter: Payer: Self-pay | Admitting: Internal Medicine

## 2019-05-07 ENCOUNTER — Other Ambulatory Visit: Payer: Self-pay

## 2019-05-07 ENCOUNTER — Inpatient Hospital Stay: Payer: No Typology Code available for payment source | Attending: Internal Medicine | Admitting: Internal Medicine

## 2019-05-07 ENCOUNTER — Inpatient Hospital Stay: Payer: No Typology Code available for payment source

## 2019-05-07 VITALS — BP 189/76 | HR 73 | Temp 97.3°F | Resp 18 | Ht 75.0 in | Wt 163.4 lb

## 2019-05-07 VITALS — BP 138/82 | HR 76

## 2019-05-07 DIAGNOSIS — C3492 Malignant neoplasm of unspecified part of left bronchus or lung: Secondary | ICD-10-CM

## 2019-05-07 DIAGNOSIS — I10 Essential (primary) hypertension: Secondary | ICD-10-CM

## 2019-05-07 DIAGNOSIS — C3432 Malignant neoplasm of lower lobe, left bronchus or lung: Secondary | ICD-10-CM | POA: Diagnosis present

## 2019-05-07 DIAGNOSIS — Z5111 Encounter for antineoplastic chemotherapy: Secondary | ICD-10-CM | POA: Diagnosis present

## 2019-05-07 DIAGNOSIS — Z5112 Encounter for antineoplastic immunotherapy: Secondary | ICD-10-CM | POA: Insufficient documentation

## 2019-05-07 DIAGNOSIS — Z79899 Other long term (current) drug therapy: Secondary | ICD-10-CM | POA: Insufficient documentation

## 2019-05-07 LAB — CMP (CANCER CENTER ONLY)
ALT: 16 U/L (ref 0–44)
AST: 32 U/L (ref 15–41)
Albumin: 3.4 g/dL — ABNORMAL LOW (ref 3.5–5.0)
Alkaline Phosphatase: 98 U/L (ref 38–126)
Anion gap: 9 (ref 5–15)
BUN: 10 mg/dL (ref 8–23)
CO2: 20 mmol/L — ABNORMAL LOW (ref 22–32)
Calcium: 8.2 mg/dL — ABNORMAL LOW (ref 8.9–10.3)
Chloride: 106 mmol/L (ref 98–111)
Creatinine: 1.35 mg/dL — ABNORMAL HIGH (ref 0.61–1.24)
GFR, Est AFR Am: >60 mL/min (ref >60)
GFR, Estimated: 55 mL/min — ABNORMAL LOW (ref >60)
Glucose, Bld: 96 mg/dL (ref 70–99)
Potassium: 4.2 mmol/L (ref 3.5–5.1)
Sodium: 135 mmol/L (ref 135–145)
Total Bilirubin: 0.4 mg/dL (ref 0.3–1.2)
Total Protein: 7 g/dL (ref 6.5–8.1)

## 2019-05-07 LAB — CBC WITH DIFFERENTIAL (CANCER CENTER ONLY)
Abs Immature Granulocytes: 0.04 10*3/uL (ref 0.00–0.07)
Basophils Absolute: 0.1 10*3/uL (ref 0.0–0.1)
Basophils Relative: 1 %
Eosinophils Absolute: 0.1 10*3/uL (ref 0.0–0.5)
Eosinophils Relative: 1 %
HCT: 41 % (ref 39.0–52.0)
Hemoglobin: 13.6 g/dL (ref 13.0–17.0)
Immature Granulocytes: 1 %
Lymphocytes Relative: 11 %
Lymphs Abs: 0.8 10*3/uL (ref 0.7–4.0)
MCH: 33.9 pg (ref 26.0–34.0)
MCHC: 33.2 g/dL (ref 30.0–36.0)
MCV: 102.2 fL — ABNORMAL HIGH (ref 80.0–100.0)
Monocytes Absolute: 1.2 10*3/uL — ABNORMAL HIGH (ref 0.1–1.0)
Monocytes Relative: 16 %
Neutro Abs: 5.3 10*3/uL (ref 1.7–7.7)
Neutrophils Relative %: 70 %
Platelet Count: 474 10*3/uL — ABNORMAL HIGH (ref 150–400)
RBC: 4.01 MIL/uL — ABNORMAL LOW (ref 4.22–5.81)
RDW: 15.6 % — ABNORMAL HIGH (ref 11.5–15.5)
WBC Count: 7.5 10*3/uL (ref 4.0–10.5)
nRBC: 0 % (ref 0.0–0.2)

## 2019-05-07 LAB — TSH: TSH: 1.953 u[IU]/mL (ref 0.320–4.118)

## 2019-05-07 MED ORDER — DEXAMETHASONE SODIUM PHOSPHATE 10 MG/ML IJ SOLN
INTRAMUSCULAR | Status: AC
  Filled 2019-05-07: qty 1

## 2019-05-07 MED ORDER — CLONIDINE HCL 0.1 MG PO TABS: 0.2000 mg | ORAL_TABLET | Freq: Once | ORAL | Status: DC

## 2019-05-07 MED ORDER — SODIUM CHLORIDE 0.9 % IV SOLN
500.0000 mg/m2 | Freq: Once | INTRAVENOUS | Status: AC
  Administered 2019-05-07: 1000 mg via INTRAVENOUS
  Filled 2019-05-07: qty 40

## 2019-05-07 MED ORDER — CLONIDINE HCL 0.1 MG PO TABS
ORAL_TABLET | ORAL | Status: AC
  Filled 2019-05-07: qty 1

## 2019-05-07 MED ORDER — ONDANSETRON HCL 4 MG/2ML IJ SOLN
INTRAMUSCULAR | Status: AC
  Filled 2019-05-07: qty 4

## 2019-05-07 MED ORDER — DEXAMETHASONE SODIUM PHOSPHATE 10 MG/ML IJ SOLN
10.0000 mg | Freq: Once | INTRAMUSCULAR | Status: AC
  Administered 2019-05-07: 10 mg via INTRAVENOUS

## 2019-05-07 MED ORDER — ONDANSETRON HCL 4 MG/2ML IJ SOLN
8.0000 mg | Freq: Once | INTRAMUSCULAR | Status: AC
  Administered 2019-05-07: 12:00:00 8 mg via INTRAVENOUS

## 2019-05-07 MED ORDER — SODIUM CHLORIDE 0.9 % IV SOLN
200.0000 mg | Freq: Once | INTRAVENOUS | Status: AC
  Administered 2019-05-07: 200 mg via INTRAVENOUS
  Filled 2019-05-07: qty 8

## 2019-05-07 MED ORDER — SODIUM CHLORIDE 0.9 % IV SOLN
Freq: Once | INTRAVENOUS | Status: AC
  Filled 2019-05-07: qty 250

## 2019-05-07 MED ORDER — CLONIDINE HCL 0.1 MG PO TABS
0.1000 mg | ORAL_TABLET | Freq: Once | ORAL | Status: AC
  Administered 2019-05-07: 0.1 mg via ORAL

## 2019-05-07 NOTE — Progress Notes (Signed)
[place] [phone]   [phone]  OFFICE PROGRESS NOTE  Patient, No Pcp Per No address on file  DIAGNOSIS: Metastatic non-small cell lung cancer, adenocarcinoma initially diagnosed as stage IB (T2a, N0, M0) non-small cell lung cancer, adenocarcinoma diagnosed in December 2017.  The patient has evidence for disease recurrence in July 2019.  Biomarker Findings Tumor Mutational Burden - TMB-Intermediate (16 Muts/Mb) Microsatellite status - MS-Stable Genomic Findings For a complete list of the genes assayed, please refer to the Appendix. KIT amplification KRAS A83M SMAD4 splice site [id number]>L NL89 I232F 7 Disease relevant genes with no reportable alterations: EGFR, ALK, BRAF, MET, RET, ERBB2, ROS1   PDL 1 expression is 0%  PRIOR THERAPY:  1) status post left lower lobectomy as well as wedge resection of the left upper lobe on 05/11/2016. 2) Adjuvant systemic chemotherapy with cisplatin 75 MG/M2 and Alimta 500 MG/M2 every 3 weeks. First dose 07/03/2016. Status post 4 cycles.  CURRENT THERAPY: Systemic chemotherapy with carboplatin for AUC of 5, Alimta 500 mg/M2 and Keytruda 200 mg IV every 3 weeks.  Status post 24 cycles.  Starting from cycle #5 the patient will be treated with maintenance Alimta and Ketruda (pembrolizumab) every 3 weeks.  INTERVAL HISTORY: William Kales [name] 64 y.o. male returns to the clinic today for follow-up visit.  The patient is feeling fine today with no concerning complaints except for mild fatigue.  His blood pressure is not well controlled and he is currently on lisinopril but he does not have a family doctor.  He denied having any current chest pain, shortness of breath, cough or hemoptysis.  He denied having any fever or chills.  He has no nausea, vomiting, diarrhea or constipation.  He denied having any headache or visual changes.  He is here today for evaluation before starting cycle #25.  MEDICAL  HISTORY: Past Medical History:  Diagnosis Date  . AAA (abdominal aortic aneurysm) ([place])   . AAA (abdominal aortic aneurysm) without rupture ([place]) 02/04/2014  . Cancer ([place])    lung  . Encounter for antineoplastic chemotherapy 06/06/2016  . History of hepatitis B   . HNP (herniated nucleus pulposus), lumbar    L4 with radiculopathy  . Hypertension   . Lung cancer ([place]) 05/18/2016  . Malignant neoplasm of lower lobe of left lung ([place]) 04/05/2016  . Mass of left lung   . Mitral insufficiency 04/06/2016   This patient will eventually need MV repair if his prognosis is good from oncology standpoint. However, his lung cancer therapy  is the priority right now. His cardiac condition won't preclude possible lung surgery.  Once his lung cancer is under control he will need a TEE to further evaluate his mitral valve anatomy and MR severity, and also have ischemic workup as tere is evidence on calcificati  . Renal insufficiency 10/09/2016  . S/P partial lobectomy of lung 05/11/2016  . Varicose veins of legs     ALLERGIES:  is allergic to tramadol.  MEDICATIONS:  Current Outpatient Medications  Medication Sig Dispense Refill  . folic acid (FOLVITE) 1 MG tablet TAKE 1 TABLET BY MOUTH EVERY DAY 90 tablet 1  . lisinopril (PRINIVIL,ZESTRIL) 2.5 MG tablet Take 1 tablet (2.5 mg total) by mouth daily. 30 tablet 11   No current facility-administered medications for this visit.    SURGICAL HISTORY:  Past Surgical History:  Procedure Laterality Date  . ABDOMINAL AORTIC ENDOVASCULAR STENT GRAFT N/A 03/12/2016  Procedure: ABDOMINAL AORTIC ENDOVASCULAR STENT GRAFT;  Surgeon: Conrad Saltville, MD;  Location: [place] [facility];  Service: Vascular;  Laterality: N/A;  . COLONOSCOPY    . INGUINAL HERNIA REPAIR Left 1975   Left inguinal hernia  . INGUINAL HERNIA REPAIR Right   . LOBECTOMY Left 05/11/2016   Procedure: LEFT LOWER LOBE LOBECTOMY AND LEFT UPPER LOBE  RESECTION AND PLACEMENT OF ON-Q;  Surgeon: [name],  MD;  Location: [facility];  Service: Thoracic;  Laterality: Left;  . LUMBAR LAMINECTOMY  October 22, 2012  . LYMPH NODE DISSECTION Left 05/11/2016   Procedure: LYMPH NODE DISSECTION;  Surgeon: [name], MD;  Location: [facility];  Service: Thoracic;  Laterality: Left;  . STAPLING OF BLEBS Left 05/11/2016   Procedure: STAPLING OF APICAL BLEB;  Surgeon: [name], MD;  Location: [facility];  Service: Thoracic;  Laterality: Left;  Marland Kitchen VIDEO ASSISTED THORACOSCOPY Left 05/18/2016   Procedure: VIDEO ASSISTED THORACOSCOPY WITH REMOVAL OF LEFT APICAL BLEB;  Surgeon: [name], MD;  Location: [facility];  Service: Thoracic;  Laterality: Left;  Marland Kitchen VIDEO ASSISTED THORACOSCOPY (VATS)/WEDGE RESECTION Left 05/11/2016   Procedure: LEFT VIDEO ASSISTED THORACOSCOPY (VATS);  Surgeon: [name], MD;  Location: [facility];  Service: Thoracic;  Laterality: Left;  Marland Kitchen VIDEO BRONCHOSCOPY N/A 05/11/2016   Procedure: VIDEO BRONCHOSCOPY, LEFT LUNG;  Surgeon: [name], MD;  Location: [facility] [place];  Service: Thoracic;  Laterality: N/A;  . VIDEO BRONCHOSCOPY N/A 05/18/2016   Procedure: VIDEO BRONCHOSCOPY WITH BRONCHIAL WASHING;  Surgeon: [name], MD;  Location: [place];  Service: Thoracic;  Laterality: N/A;    REVIEW OF SYSTEMS:  A comprehensive review of systems was negative except for: Constitutional: positive for fatigue   PHYSICAL EXAMINATION: General appearance: alert, cooperative and no distress Head: Normocephalic, without obvious abnormality, atraumatic Neck: no adenopathy, no JVD, supple, symmetrical, trachea midline and thyroid not enlarged, symmetric, no tenderness/mass/nodules Lymph nodes: Cervical, supraclavicular, and axillary nodes normal. Resp: clear to auscultation bilaterally Back: symmetric, no curvature. ROM normal. No CVA tenderness. Cardio: regular rate and rhythm, S1, S2 normal, no murmur, click, rub or gallop GI: soft, non-tender; bowel sounds normal; no masses,  no organomegaly Extremities:  extremities normal, atraumatic, no cyanosis or edema   ECOG PERFORMANCE STATUS: 1 - Symptomatic but completely ambulatory  Blood pressure (!) 189/76, pulse 73, temperature (!) 97.3 F (36.3 C), temperature source Temporal, resp. rate 18, height _0  (1.905 m), weight 163 lb 6.4 oz (74.1 kg), SpO2 95 %.  LABORATORY DATA: Lab Results  Component Value Date   WBC 7.5 05/07/2019   HGB 13.6 05/07/2019   HCT 41.0 05/07/2019   MCV 102.2 (H) 05/07/2019   PLT 474 (H) 05/07/2019      Chemistry      Component Value Date/Time   NA 136 04/16/2019 1150   NA 138 01/11/2017 1343   K 3.6 04/16/2019 1150   K 4.7 01/11/2017 1343   CL 107 04/16/2019 1150   CO2 19 (L) 04/16/2019 1150   CO2 23 01/11/2017 1343   BUN 9 04/16/2019 1150   BUN 20.2 01/11/2017 1343   CREATININE 1.24 04/16/2019 1150   CREATININE 1.6 (H) 01/11/2017 1343      Component Value Date/Time   CALCIUM 8.8 (L) 04/16/2019 1150   CALCIUM 9.3 01/11/2017 1343   ALKPHOS 88 04/16/2019 1150   ALKPHOS 89 01/11/2017 1343   AST 32 04/16/2019 1150   AST 41 (H) 01/11/2017 1343   ALT  14 04/16/2019 1150   ALT 27 01/11/2017 1343   BILITOT 0.5 04/16/2019 1150   BILITOT 0.49 01/11/2017 1343       RADIOGRAPHIC STUDIES: CT Chest W Contrast  Result Date: 04/14/2019 CLINICAL DATA:  64 year old male with history of non-small cell lung cancer. Chemotherapy ongoing. Radiation therapy complete June 2020. Follow-up evaluation. EXAM: CT CHEST, ABDOMEN, AND PELVIS WITH CONTRAST TECHNIQUE: Multidetector CT imaging of the chest, abdomen and pelvis was performed following the standard protocol during bolus administration of intravenous contrast. CONTRAST:  154m OMNIPAQUE IOHEXOL 300 MG/ML  SOLN COMPARISON:  CT the chest, abdomen and pelvis 01/20/2019. FINDINGS: CT CHEST FINDINGS Cardiovascular: Heart size is borderline enlarged. There is no significant pericardial fluid, thickening or pericardial calcification. Aortic atherosclerosis. No definite  coronary artery calcifications. Mediastinum/Nodes: No pathologically enlarged mediastinal or hilar lymph nodes. Esophagus is unremarkable in appearance. No axillary lymphadenopathy. Lungs/Pleura: Calcified pleural plaques in the thorax bilaterally (right greater than left), compatible with asbestos related pleural disease. 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 Postoperative changes of left lower lobectomy with compensatory hyperexpansion of the left upper lobe. Previously noted pulmonary nodule in the periphery of the left upper lobe has significantly regressed and nearly completely resolved (axial image 115 of series 6) measuring 6 x 4 mm on today's study (previously 9 mm on 01/20/2019), presumably infectious or inflammatory in etiology. No other new suspicious appearing pulmonary nodules or masses are noted. Diffuse bronchial wall thickening with moderate centrilobular and paraseptal emphysema. Musculoskeletal: Expansile mixed lucent and sclerotic lesion in the posterior elements of the T4 vertebra, similar to prior studies measuring 2.7 x 2.3 cm on today's examination, compatible with a treated metastatic lesion. There are no other new aggressive appearing lytic or blastic lesions noted in the visualized portions of the skeleton. CT ABDOMEN PELVIS FINDINGS Hepatobiliary: A few subcentimeter low-attenuation lesions in the liver are too small to definitively characterize, but are stable compared to prior studies and favored to represent tiny cysts. No intra or extrahepatic biliary ductal dilatation. Gallbladder is normal in appearance. Pancreas: No pancreatic mass. No pancreatic ductal dilatation. No pancreatic or peripancreatic fluid collections or  inflammatory changes. Spleen: Unremarkable. Adrenals/Urinary Tract: Subcentimeter low-attenuation lesions in both kidneys, too small to definitively characterize, but favored to represent tiny cysts. No hydroureteronephrosis. Urinary bladder is unremarkable in appearance. Bilateral adrenal glands are normal in appearance. Stomach/Bowel: Normal appearance of the stomach. No pathologic dilatation of small bowel or colon. Normal appendix. Vascular/Lymphatic: Aortic atherosclerosis with aorto bi-iliac stent graft which is widely patent bilaterally. No lymphadenopathy noted in the abdomen or pelvis. Reproductive: Prostate gland and seminal vesicles are unremarkable in appearance. Other: No significant volume of ascites.  No pneumoperitoneum. Musculoskeletal: There are no aggressive appearing lytic or blastic lesions noted in the visualized portions of the skeleton. IMPRESSION: 1. Status post left lower lobectomy. Previously noted left upper lobe pulmonary nodule has regressed compared to the prior study, presumably of infectious or inflammatory etiology. No definitive findings to suggest metastatic disease in the chest, abdomen or pelvis. 2. 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 3. Trace bilateral pleural effusions lying dependently. 4. Treated metastatic lesion in the posterior elements of T4 vertebra, unchanged. 5. Aortic atherosclerosis. 6. Additional incidental findings, as above. Electronically Signed   By:  Vinnie Langton M.D.   On: 04/14/2019 09:57   CT Abdomen Pelvis W Contrast  Result Date: 04/14/2019 CLINICAL DATA:  64 year old male with history of non-small cell lung cancer. Chemotherapy ongoing. Radiation therapy complete June 2020. Follow-up evaluation. EXAM: CT CHEST, ABDOMEN, AND PELVIS WITH CONTRAST TECHNIQUE: Multidetector CT imaging of the chest, abdomen and pelvis was performed following the standard protocol during bolus  administration of intravenous contrast. CONTRAST:  132m OMNIPAQUE IOHEXOL 300 MG/ML  SOLN COMPARISON:  CT the chest, abdomen and pelvis 01/20/2019. FINDINGS: CT CHEST FINDINGS Cardiovascular: Heart size is borderline enlarged. There is no significant pericardial fluid, thickening or pericardial calcification. Aortic atherosclerosis. No definite coronary artery calcifications. Mediastinum/Nodes: No pathologically enlarged mediastinal or hilar lymph nodes. Esophagus is unremarkable in appearance. No axillary lymphadenopathy. Lungs/Pleura: Calcified pleural plaques in the thorax bilaterally (right greater than left), compatible with asbestos related pleural disease. 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 Postoperative changes of left lower lobectomy with compensatory hyperexpansion of the left upper lobe. Previously noted pulmonary nodule in the periphery of the left upper lobe has significantly regressed and nearly completely resolved (axial image 115 of series 6) measuring 6 x 4 mm on today's study (previously 9 mm on 01/20/2019), presumably infectious or inflammatory in etiology. No other new suspicious appearing pulmonary nodules or masses are noted. Diffuse bronchial wall thickening with moderate centrilobular and paraseptal emphysema. Musculoskeletal: Expansile mixed lucent and sclerotic lesion in the posterior elements of the T4 vertebra, similar to prior studies measuring 2.7 x 2.3 cm on today's examination, compatible with a treated metastatic lesion. There are no other new aggressive appearing lytic or blastic lesions noted in the visualized portions of the skeleton. CT ABDOMEN PELVIS FINDINGS Hepatobiliary: A few subcentimeter low-attenuation  lesions in the liver are too small to definitively characterize, but are stable compared to prior studies and favored to represent tiny cysts. No intra or extrahepatic biliary ductal dilatation. Gallbladder is normal in appearance. Pancreas: No pancreatic mass. No pancreatic ductal dilatation. No pancreatic or peripancreatic fluid collections or inflammatory changes. Spleen: Unremarkable. Adrenals/Urinary Tract: Subcentimeter low-attenuation lesions in both kidneys, too small to definitively characterize, but favored to represent tiny cysts. No hydroureteronephrosis. Urinary bladder is unremarkable in appearance. Bilateral adrenal glands are normal in appearance. Stomach/Bowel: Normal appearance of the stomach. No pathologic dilatation of small bowel or colon. Normal appendix. Vascular/Lymphatic: Aortic atherosclerosis with aorto bi-iliac stent graft which is widely patent bilaterally. No lymphadenopathy noted in the abdomen or pelvis. Reproductive: Prostate gland and seminal vesicles are unremarkable in appearance. Other: No significant volume of ascites.  No pneumoperitoneum. Musculoskeletal: There are no aggressive appearing lytic or blastic lesions noted in the visualized portions of the skeleton. IMPRESSION: 1. Status post left lower lobectomy. Previously noted left upper lobe pulmonary nodule has regressed compared to the prior study, presumably of infectious or inflammatory etiology. No definitive findings to suggest metastatic disease in the chest, abdomen or pelvis. 2. 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 3. Trace bilateral pleural effusions lying dependently. 4. Treated metastatic lesion in the posterior elements of T4 vertebra, unchanged. 5. Aortic atherosclerosis. 6. Additional incidental findings, as above. Electronically Signed   By: DVinnie LangtonM.D.   On: 04/14/2019 09:57    ASSESSMENT AND PLAN:  This  is a very  pleasant 64 years old white male with a stage IB non-small cell lung cancer, adenocarcinoma status post wedge resection of the left upper lobe followed by 4 cycles of adjuvant systemic chemotherapy with cisplatin and Alimta and he tolerated his treatment well except for fatigue. The patient has been on observation since June 2018.   The recent imaging studies including CT scan of the chest as well as a PET scan showed evidence for disease recurrence with metastatic disease presented with bilateral pulmonary nodules as well as destructive bone lesions at T4 vertebral body, biopsy proven to be metastatic adenocarcinoma. Molecular studies by foundation 1 showed no actionable mutations.  PDL 1 expression was negative. The patient is currently undergoing treatment with carboplatin, Alimta and Ketruda (pembrolizumab) status post 24 cycles.  Starting from cycle #5 he is on maintenance treatment with Alimta and Keytruda every 3 weeks. The patient continues to tolerate his maintenance treatment fairly well with no concerning complaints. I recommended for him to proceed with cycle #25 today as planned. I will see the patient back for follow-up visit in 3 weeks for evaluation before starting cycle #26. For hypertension, I will give the patient 0.2 mg of clonidine x1 today.  He was also advised to establish care with a primary care physician for evaluation and adjustment of his hypertension medication. The patient will come back for follow-up visit in 3 weeks for evaluation before the next cycle of his treatment. He was advised to call immediately if he has any concerning symptoms in the interval. The patient voices understanding of current disease status and treatment options and is in agreement with the current care plan. All questions were answered. The patient knows to call the clinic with any problems, questions or concerns. We can certainly see the patient much sooner if necessary. Disclaimer: This note was  dictated with voice recognition software. Similar sounding words can inadvertently be transcribed and may be missed upon review. Eilleen Kempf, MD 05/07/19

## 2019-05-07 NOTE — Progress Notes (Signed)
Per Dr Julien Nordmann may proceed with Alimta today with Recent BP . Clonidine ordered per Dr Julien Nordmann.

## 2019-05-07 NOTE — Progress Notes (Signed)
Per Dr. Julien Nordmann, decrease clonidine dose to 0.1mg .

## 2019-05-07 NOTE — Patient Instructions (Signed)
[facility] Discharge Instructions for Patients Receiving Chemotherapy  Today you received the following chemotherapy agents Keytruda; Alimta  To help prevent nausea and vomiting after your treatment, we encourage you to take your nausea medication as directed   If you develop nausea and vomiting that is not controlled by your nausea medication, call the clinic.   BELOW ARE SYMPTOMS THAT SHOULD BE REPORTED IMMEDIATELY:  *FEVER GREATER THAN 100.5 F  *CHILLS WITH OR WITHOUT FEVER  NAUSEA AND VOMITING THAT IS NOT CONTROLLED WITH YOUR NAUSEA MEDICATION  *UNUSUAL SHORTNESS OF BREATH  *UNUSUAL BRUISING OR BLEEDING  TENDERNESS IN MOUTH AND THROAT WITH OR WITHOUT PRESENCE OF ULCERS  *URINARY PROBLEMS  *BOWEL PROBLEMS  UNUSUAL RASH Items with * indicate a potential emergency and should be followed up as soon as possible.  Feel free to call the clinic should you have any questions or concerns. The clinic phone number is [phone].  Please show the CHEMO ALERT CARD at check-in to the Emergency Department and triage nurse.   

## 2019-05-08 ENCOUNTER — Telehealth: Payer: Self-pay | Admitting: Internal Medicine

## 2019-05-08 NOTE — Telephone Encounter (Signed)
Scheduled per 1/14 los. Called pt no answer and unable to leave msg. Mailing printout

## 2019-05-28 ENCOUNTER — Other Ambulatory Visit: Payer: Self-pay

## 2019-05-28 ENCOUNTER — Encounter: Payer: Self-pay | Admitting: Internal Medicine

## 2019-05-28 ENCOUNTER — Inpatient Hospital Stay: Payer: No Typology Code available for payment source

## 2019-05-28 ENCOUNTER — Inpatient Hospital Stay: Payer: No Typology Code available for payment source | Attending: Internal Medicine | Admitting: Internal Medicine

## 2019-05-28 VITALS — BP 177/83 | HR 66 | Temp 98.5°F | Resp 18 | Ht 75.0 in | Wt 156.9 lb

## 2019-05-28 DIAGNOSIS — C7951 Secondary malignant neoplasm of bone: Secondary | ICD-10-CM | POA: Insufficient documentation

## 2019-05-28 DIAGNOSIS — C3432 Malignant neoplasm of lower lobe, left bronchus or lung: Secondary | ICD-10-CM | POA: Diagnosis present

## 2019-05-28 DIAGNOSIS — Z79899 Other long term (current) drug therapy: Secondary | ICD-10-CM | POA: Insufficient documentation

## 2019-05-28 DIAGNOSIS — C3492 Malignant neoplasm of unspecified part of left bronchus or lung: Secondary | ICD-10-CM

## 2019-05-28 DIAGNOSIS — Z5111 Encounter for antineoplastic chemotherapy: Secondary | ICD-10-CM | POA: Diagnosis not present

## 2019-05-28 DIAGNOSIS — I10 Essential (primary) hypertension: Secondary | ICD-10-CM

## 2019-05-28 DIAGNOSIS — Z5112 Encounter for antineoplastic immunotherapy: Secondary | ICD-10-CM | POA: Diagnosis not present

## 2019-05-28 LAB — CBC WITH DIFFERENTIAL (CANCER CENTER ONLY)
Abs Immature Granulocytes: 0.02 10*3/uL (ref 0.00–0.07)
Basophils Absolute: 0 10*3/uL (ref 0.0–0.1)
Basophils Relative: 1 %
Eosinophils Absolute: 0 10*3/uL (ref 0.0–0.5)
Eosinophils Relative: 1 %
HCT: 42.9 % (ref 39.0–52.0)
Hemoglobin: 14.2 g/dL (ref 13.0–17.0)
Immature Granulocytes: 0 %
Lymphocytes Relative: 11 %
Lymphs Abs: 0.7 10*3/uL (ref 0.7–4.0)
MCH: 32.9 pg (ref 26.0–34.0)
MCHC: 33.1 g/dL (ref 30.0–36.0)
MCV: 99.3 fL (ref 80.0–100.0)
Monocytes Absolute: 1.4 10*3/uL — ABNORMAL HIGH (ref 0.1–1.0)
Monocytes Relative: 22 %
Neutro Abs: 4.1 10*3/uL (ref 1.7–7.7)
Neutrophils Relative %: 65 %
Platelet Count: 403 10*3/uL — ABNORMAL HIGH (ref 150–400)
RBC: 4.32 MIL/uL (ref 4.22–5.81)
RDW: 14.6 % (ref 11.5–15.5)
WBC Count: 6.3 10*3/uL (ref 4.0–10.5)
nRBC: 0 % (ref 0.0–0.2)

## 2019-05-28 LAB — CMP (CANCER CENTER ONLY)
ALT: 77 U/L — ABNORMAL HIGH (ref 0–44)
AST: 41 U/L (ref 15–41)
Albumin: 3.4 g/dL — ABNORMAL LOW (ref 3.5–5.0)
Alkaline Phosphatase: 91 U/L (ref 38–126)
Anion gap: 10 (ref 5–15)
BUN: 10 mg/dL (ref 8–23)
CO2: 23 mmol/L (ref 22–32)
Calcium: 8.7 mg/dL — ABNORMAL LOW (ref 8.9–10.3)
Chloride: 100 mmol/L (ref 98–111)
Creatinine: 1.47 mg/dL — ABNORMAL HIGH (ref 0.61–1.24)
GFR, Est AFR Am: 57 mL/min — ABNORMAL LOW (ref >60)
GFR, Estimated: 49 mL/min — ABNORMAL LOW (ref >60)
Glucose, Bld: 96 mg/dL (ref 70–99)
Potassium: 3.5 mmol/L (ref 3.5–5.1)
Sodium: 133 mmol/L — ABNORMAL LOW (ref 135–145)
Total Bilirubin: 0.7 mg/dL (ref 0.3–1.2)
Total Protein: 7.3 g/dL (ref 6.5–8.1)

## 2019-05-28 LAB — TSH: TSH: 2.554 u[IU]/mL (ref 0.320–4.118)

## 2019-05-28 MED ORDER — SODIUM CHLORIDE 0.9 % IV SOLN
200.0000 mg | Freq: Once | INTRAVENOUS | Status: AC
  Administered 2019-05-28: 200 mg via INTRAVENOUS
  Filled 2019-05-28: qty 8

## 2019-05-28 MED ORDER — ONDANSETRON HCL 4 MG/2ML IJ SOLN
8.0000 mg | Freq: Once | INTRAMUSCULAR | Status: AC
  Administered 2019-05-28: 8 mg via INTRAVENOUS

## 2019-05-28 MED ORDER — DEXAMETHASONE SODIUM PHOSPHATE 10 MG/ML IJ SOLN
INTRAMUSCULAR | Status: AC
  Filled 2019-05-28: qty 1

## 2019-05-28 MED ORDER — SODIUM CHLORIDE 0.9 % IV SOLN
Freq: Once | INTRAVENOUS | Status: AC
  Filled 2019-05-28: qty 250

## 2019-05-28 MED ORDER — DEXAMETHASONE SODIUM PHOSPHATE 10 MG/ML IJ SOLN
10.0000 mg | Freq: Once | INTRAMUSCULAR | Status: AC
  Administered 2019-05-28: 10 mg via INTRAVENOUS

## 2019-05-28 MED ORDER — ONDANSETRON HCL 4 MG/2ML IJ SOLN
INTRAMUSCULAR | Status: AC
  Filled 2019-05-28: qty 4

## 2019-05-28 MED ORDER — SODIUM CHLORIDE 0.9 % IV SOLN
500.0000 mg/m2 | Freq: Once | INTRAVENOUS | Status: AC
  Administered 2019-05-28: 1000 mg via INTRAVENOUS
  Filled 2019-05-28: qty 40

## 2019-05-28 NOTE — Progress Notes (Signed)
[facility] [phone]   Fax:(336) [phone]  OFFICE PROGRESS NOTE  [name], MD [address] [place] [address]  DIAGNOSIS: Metastatic non-small cell lung cancer, adenocarcinoma initially diagnosed as stage IB (T2a, N0, M0) non-small cell lung cancer, adenocarcinoma diagnosed in December 2017.  The patient has evidence for disease recurrence in July 2019.  Biomarker Findings Tumor Mutational Burden - TMB-Intermediate (16 Muts/Mb) Microsatellite status - MS-Stable Genomic Findings For a complete list of the genes assayed, please refer to the Appendix. KIT amplification KRAS J03E SMAD4 splice site 0923-3A>Q TM22 I232F 7 Disease relevant genes with no reportable alterations: EGFR, ALK, BRAF, MET, RET, ERBB2, ROS1   PDL 1 expression is 0%  PRIOR THERAPY:  1) status post left lower lobectomy as well as wedge resection of the left upper lobe on 05/11/2016. 2) Adjuvant systemic chemotherapy with cisplatin 75 MG/M2 and Alimta 500 MG/M2 every 3 weeks. First dose 07/03/2016. Status post 4 cycles.  CURRENT THERAPY: Systemic chemotherapy with carboplatin for AUC of 5, Alimta 500 mg/M2 and Keytruda 200 mg IV every 3 weeks.  Status post 25 cycles.  Starting from cycle #5 the patient will be treated with maintenance Alimta and Ketruda (pembrolizumab) every 3 weeks.  INTERVAL HISTORY: [name]. 64 y.o. male returns to the clinic today for follow-up visit.  The patient is feeling fine today with no concerning complaints.  He denied having any current chest pain, shortness of breath, cough or hemoptysis.  He denied having any fever or chills.  He has no nausea, vomiting, diarrhea or constipation.  He has no headache or visual changes.  He was seen by his primary care physician recently and she changed his blood pressure medication to amlodipine 2.5 mg p.o. daily.  His blood pressure was high today.  The patient is here today for evaluation  before starting cycle #26.  MEDICAL HISTORY: Past Medical History:  Diagnosis Date  . AAA (abdominal aortic aneurysm) ([facility])   . AAA (abdominal aortic aneurysm) without rupture ([facility]) 02/04/2014  . Cancer ([facility])    lung  . Encounter for antineoplastic chemotherapy 06/06/2016  . History of hepatitis B   . HNP (herniated nucleus pulposus), lumbar    L4 with radiculopathy  . Hypertension   . Lung cancer ([facility]) 05/18/2016  . Malignant neoplasm of lower lobe of left lung ([facility]) 04/05/2016  . Mass of left lung   . Mitral insufficiency 04/06/2016   This patient will eventually need MV repair if his prognosis is good from oncology standpoint. However, his lung cancer therapy  is the priority right now. His cardiac condition won't preclude possible lung surgery.  Once his lung cancer is under control he will need a TEE to further evaluate his mitral valve anatomy and MR severity, and also have ischemic workup as tere is evidence on calcificati  . Renal insufficiency 10/09/2016  . S/P partial lobectomy of lung 05/11/2016  . Varicose veins of legs     ALLERGIES:  is allergic to tramadol.  MEDICATIONS:  Current Outpatient Medications  Medication Sig Dispense Refill  . amLODipine (NORVASC) 2.5 MG tablet Take 2.5 mg by mouth at bedtime.    . folic acid (FOLVITE) 1 MG tablet TAKE 1 TABLET BY MOUTH EVERY DAY 90 tablet 1  . lisinopril (PRINIVIL,ZESTRIL) 2.5 MG tablet Take 1 tablet (2.5 mg total) by mouth daily. 30 tablet 11   No current facility-administered medications for this visit.  SURGICAL HISTORY:  Past Surgical History:  Procedure Laterality Date  . ABDOMINAL AORTIC ENDOVASCULAR STENT GRAFT N/A 03/12/2016   Procedure: ABDOMINAL AORTIC ENDOVASCULAR STENT GRAFT;  Surgeon: Conrad Elmwood, MD;  Location: [facility];  Service: Vascular;  Laterality: N/A;  . COLONOSCOPY    . INGUINAL HERNIA REPAIR Left 1975   Left inguinal hernia  . INGUINAL HERNIA REPAIR Right   . LOBECTOMY Left 05/11/2016    Procedure: LEFT LOWER LOBE LOBECTOMY AND LEFT UPPER LOBE  RESECTION AND PLACEMENT OF ON-Q;  Surgeon: [name], MD;  Location: [facility];  Service: Thoracic;  Laterality: Left;  . LUMBAR LAMINECTOMY  October 22, 2012  . LYMPH NODE DISSECTION Left 05/11/2016   Procedure: LYMPH NODE DISSECTION;  Surgeon: [name], MD;  Location: [facility];  Service: Thoracic;  Laterality: Left;  . STAPLING OF BLEBS Left 05/11/2016   Procedure: STAPLING OF APICAL BLEB;  Surgeon: [name], MD;  Location: [facility];  Service: Thoracic;  Laterality: Left;  Marland Kitchen VIDEO ASSISTED THORACOSCOPY Left 05/18/2016   Procedure: VIDEO ASSISTED THORACOSCOPY WITH REMOVAL OF LEFT APICAL BLEB;  Surgeon: [name], MD;  Location: [facility];  Service: Thoracic;  Laterality: Left;  Marland Kitchen VIDEO ASSISTED THORACOSCOPY (VATS)/WEDGE RESECTION Left 05/11/2016   Procedure: LEFT VIDEO ASSISTED THORACOSCOPY (VATS);  Surgeon: [name], MD;  Location: [facility];  Service: Thoracic;  Laterality: Left;  Marland Kitchen VIDEO BRONCHOSCOPY N/A 05/11/2016   Procedure: VIDEO BRONCHOSCOPY, LEFT LUNG;  Surgeon: [name], MD;  Location: [facility];  Service: Thoracic;  Laterality: N/A;  . VIDEO BRONCHOSCOPY N/A 05/18/2016   Procedure: VIDEO BRONCHOSCOPY WITH BRONCHIAL WASHING;  Surgeon: [name], MD;  Location: [facility];  Service: Thoracic;  Laterality: N/A;    REVIEW OF SYSTEMS:  A comprehensive review of systems was negative.   PHYSICAL EXAMINATION: General appearance: alert, cooperative and no distress Head: Normocephalic, without obvious abnormality, atraumatic Neck: no adenopathy, no JVD, supple, symmetrical, trachea midline and thyroid not enlarged, symmetric, no tenderness/mass/nodules Lymph nodes: Cervical, supraclavicular, and axillary nodes normal. Resp: clear to auscultation bilaterally Back: symmetric, no curvature. ROM normal. No CVA tenderness. Cardio: regular rate and rhythm, S1, S2 normal, no murmur, click, rub or gallop GI: soft,  non-tender; bowel sounds normal; no masses,  no organomegaly Extremities: extremities normal, atraumatic, no cyanosis or edema   ECOG PERFORMANCE STATUS: 1 - Symptomatic but completely ambulatory  Blood pressure (!) 177/83, pulse 66, temperature 98.5 F (36.9 C), temperature source Temporal, resp. rate 18, height '6\' 3"'  (1.905 m), weight 156 lb 14.4 oz (71.2 kg), SpO2 94 %.  LABORATORY DATA: Lab Results  Component Value Date   WBC 6.3 05/28/2019   HGB 14.2 05/28/2019   HCT 42.9 05/28/2019   MCV 99.3 05/28/2019   PLT 403 (H) 05/28/2019      Chemistry      Component Value Date/Time   NA 135 05/07/2019 1000   NA 138 01/11/2017 1343   K 4.2 05/07/2019 1000   K 4.7 01/11/2017 1343   CL 106 05/07/2019 1000   CO2 20 (L) 05/07/2019 1000   CO2 23 01/11/2017 1343   BUN 10 05/07/2019 1000   BUN 20.2 01/11/2017 1343   CREATININE 1.35 (H) 05/07/2019 1000   CREATININE 1.6 (H) 01/11/2017 1343      Component Value Date/Time   CALCIUM 8.2 (L) 05/07/2019 1000   CALCIUM 9.3 01/11/2017 1343   ALKPHOS 98 05/07/2019 1000   ALKPHOS 89 01/11/2017 1343  AST 32 05/07/2019 1000   AST 41 (H) 01/11/2017 1343   ALT 16 05/07/2019 1000   ALT 27 01/11/2017 1343   BILITOT 0.4 05/07/2019 1000   BILITOT 0.49 01/11/2017 1343       RADIOGRAPHIC STUDIES: No results found.  ASSESSMENT AND PLAN:  This is a very pleasant 64 years old white male with a stage IB non-small cell lung cancer, adenocarcinoma status post wedge resection of the left upper lobe followed by 4 cycles of adjuvant systemic chemotherapy with cisplatin and Alimta and he tolerated his treatment well except for fatigue. The patient has been on observation since June 2018.   The recent imaging studies including CT scan of the chest as well as a PET scan showed evidence for disease recurrence with metastatic disease presented with bilateral pulmonary nodules as well as destructive bone lesions at T4 vertebral body, biopsy proven to be  metastatic adenocarcinoma. Molecular studies by foundation 1 showed no actionable mutations.  PDL 1 expression was negative. The patient is currently undergoing treatment with carboplatin, Alimta and Ketruda (pembrolizumab) status post 25 cycles.  Starting from cycle #5 he is on maintenance treatment with Alimta and Keytruda every 3 weeks. The patient has been tolerating this treatment well with no concerning adverse effects. I recommended for him to proceed with cycle #26 today as planned. For the hypertension he was advised to keep a log of his blood pressure medication at home and to discuss with his primary care physician for adjustment of his medication.  I will also give him a dose of clonidine 0.1 mg p.o. x1. The patient was advised to call immediately if he has any concerning symptoms in the interval. The patient voices understanding of current disease status and treatment options and is in agreement with the current care plan. All questions were answered. The patient knows to call the clinic with any problems, questions or concerns. We can certainly see the patient much sooner if necessary. Disclaimer: This note was dictated with voice recognition software. Similar sounding words can inadvertently be transcribed and may be missed upon review. Eilleen Kempf, MD 05/28/19

## 2019-05-28 NOTE — Patient Instructions (Signed)
[facility] Discharge Instructions for Patients Receiving Chemotherapy  Today you received the following chemotherapy agent/immunotherapy :  Pemetrexed, Pembrolizumab.  To help prevent nausea and vomiting after your treatment, we encourage you to take your nausea medication as prescribed.   If you develop nausea and vomiting that is not controlled by your nausea medication, call the clinic.   BELOW ARE SYMPTOMS THAT SHOULD BE REPORTED IMMEDIATELY:  *FEVER GREATER THAN 100.5 F  *CHILLS WITH OR WITHOUT FEVER  NAUSEA AND VOMITING THAT IS NOT CONTROLLED WITH YOUR NAUSEA MEDICATION  *UNUSUAL SHORTNESS OF BREATH  *UNUSUAL BRUISING OR BLEEDING  TENDERNESS IN MOUTH AND THROAT WITH OR WITHOUT PRESENCE OF ULCERS  *URINARY PROBLEMS  *BOWEL PROBLEMS  UNUSUAL RASH Items with * indicate a potential emergency and should be followed up as soon as possible.  Feel free to call the clinic should you have any questions or concerns. The clinic phone number is [phone].  Please show the [facility] at check-in to the Emergency Department and triage nurse.

## 2019-06-17 ENCOUNTER — Telehealth: Payer: Self-pay

## 2019-06-17 NOTE — Progress Notes (Deleted)
[facility] OFFICE PROGRESS NOTE  [name], [address] [place] [address]  DIAGNOSIS: Metastatic non-small cell lung cancer, adenocarcinoma initially diagnosed as stage IB (T2a, N0, M0) non-small cell lung cancer, adenocarcinoma diagnosed in December 2017. The patient has evidence for disease recurrence in July 2019.  Biomarker Findings Tumor Mutational Burden - TMB-Intermediate (16 Muts/Mb) Microsatellite status - MS-Stable Genomic Findings For a complete list of the genes assayed, please refer to the Appendix. KIT amplification KRAS K16W SMAD4 splice site 1093-2T>F TD32 I232F 7 Disease relevant genes with no reportable alterations: EGFR, ALK, BRAF, MET, RET, ERBB2, ROS1   PDL 1 expression is 0%  PRIOR THERAPY:  1) status post left lower lobectomy as well as wedge resection of the left upper lobe on 05/11/2016. 2) Adjuvant systemic chemotherapy with cisplatin 75 MG/M2 and Alimta 500 MG/M2 every 3 weeks. First dose 07/03/2016. Status post 4 cycles.  CURRENT THERAPY: Systemic chemotherapy with carboplatin for AUC of 5, Alimta 500 mg/M2 and Keytruda 200 mg IV every 3 weeks. Status post 26cycles. Starting from cycle #5 the patient will be treated with maintenance Alimta and Ketruda (pembrolizumab) every 3 weeks.  INTERVAL HISTORY: William Kales [name] 64 y.o. male returns to the clinic for a follow up visit. The patient is feeling well today without any concerning complaints except fatigue and insomnia. The patient continues to tolerate treatment with __ well without any adverse effects. Denies any fever, chills, night sweats, or weight loss. Denies any chest pain, shortness of breath, cough, or hemoptysis. Denies any nausea, vomiting, or constipation. He had mild diarrhea which has resolved with imodium. Denies any headache or visual changes. Denies any rashes or skin changes. The patient is here today for evaluation prior to starting cycle # 27  MEDICAL  HISTORY: Past Medical History:  Diagnosis Date  . AAA (abdominal aortic aneurysm) ([facility])   . AAA (abdominal aortic aneurysm) without rupture ([facility]) 02/04/2014  . Cancer ([facility])    lung  . Encounter for antineoplastic chemotherapy 06/06/2016  . History of hepatitis B   . HNP (herniated nucleus pulposus), lumbar    L4 with radiculopathy  . Hypertension   . Lung cancer ([facility]) 05/18/2016  . Malignant neoplasm of lower lobe of left lung ([facility]) 04/05/2016  . Mass of left lung   . Mitral insufficiency 04/06/2016   This patient will eventually need MV repair if his prognosis is good from oncology standpoint. However, his lung cancer therapy  is the priority right now. His cardiac condition won't preclude possible lung surgery.  Once his lung cancer is under control he will need a TEE to further evaluate his mitral valve anatomy and MR severity, and also have ischemic workup as tere is evidence on calcificati  . Renal insufficiency 10/09/2016  . S/P partial lobectomy of lung 05/11/2016  . Varicose veins of legs     ALLERGIES:  is allergic to tramadol.  MEDICATIONS:  Current Outpatient Medications  Medication Sig Dispense Refill  . amLODipine (NORVASC) 2.5 MG tablet Take 2.5 mg by mouth at bedtime.    . folic acid (FOLVITE) 1 MG tablet TAKE 1 TABLET BY MOUTH EVERY DAY 90 tablet 1  . lisinopril (PRINIVIL,ZESTRIL) 2.5 MG tablet Take 1 tablet (2.5 mg total) by mouth daily. 30 tablet 11   No current facility-administered medications for this visit.    SURGICAL HISTORY:  Past Surgical History:  Procedure Laterality Date  . ABDOMINAL AORTIC ENDOVASCULAR STENT GRAFT N/A 03/12/2016  Procedure: ABDOMINAL AORTIC ENDOVASCULAR STENT GRAFT;  Surgeon: Conrad Ackerman, MD;  Location: [facility];  Service: Vascular;  Laterality: N/A;  . COLONOSCOPY    . INGUINAL HERNIA REPAIR Left 1975   Left inguinal hernia  . INGUINAL HERNIA REPAIR Right   . LOBECTOMY Left 05/11/2016   Procedure: LEFT LOWER LOBE LOBECTOMY AND LEFT  UPPER LOBE  RESECTION AND PLACEMENT OF ON-Q;  Surgeon: [name], MD;  Location: [facility];  Service: Thoracic;  Laterality: Left;  . LUMBAR LAMINECTOMY  October 22, 2012  . LYMPH NODE DISSECTION Left 05/11/2016   Procedure: LYMPH NODE DISSECTION;  Surgeon: [name], MD;  Location: [facility];  Service: Thoracic;  Laterality: Left;  . STAPLING OF BLEBS Left 05/11/2016   Procedure: STAPLING OF APICAL BLEB;  Surgeon: [name], MD;  Location: [facility];  Service: Thoracic;  Laterality: Left;  Marland Kitchen VIDEO ASSISTED THORACOSCOPY Left 05/18/2016   Procedure: VIDEO ASSISTED THORACOSCOPY WITH REMOVAL OF LEFT APICAL BLEB;  Surgeon: [name], MD;  Location: [facility];  Service: Thoracic;  Laterality: Left;  Marland Kitchen VIDEO ASSISTED THORACOSCOPY (VATS)/WEDGE RESECTION Left 05/11/2016   Procedure: LEFT VIDEO ASSISTED THORACOSCOPY (VATS);  Surgeon: [name], MD;  Location: [facility];  Service: Thoracic;  Laterality: Left;  Marland Kitchen VIDEO BRONCHOSCOPY N/A 05/11/2016   Procedure: VIDEO BRONCHOSCOPY, LEFT LUNG;  Surgeon: [name], MD;  Location: [facility];  Service: Thoracic;  Laterality: N/A;  . VIDEO BRONCHOSCOPY N/A 05/18/2016   Procedure: VIDEO BRONCHOSCOPY WITH BRONCHIAL WASHING;  Surgeon: [name], MD;  Location: [facility];  Service: Thoracic;  Laterality: N/A;    REVIEW OF SYSTEMS:   Review of Systems  Constitutional: Negative for appetite change, chills, fatigue, fever and unexpected weight change.  HENT:   Negative for mouth sores, nosebleeds, sore throat and trouble swallowing.   Eyes: Negative for eye problems and icterus.  Respiratory: Negative for cough, hemoptysis, shortness of breath and wheezing.   Cardiovascular: Negative for chest pain and leg swelling.  Gastrointestinal: Negative for abdominal pain, constipation, diarrhea, nausea and vomiting.  Genitourinary: Negative for bladder incontinence, difficulty urinating, dysuria, frequency and hematuria.   Musculoskeletal: Negative for back  pain, gait problem, neck pain and neck stiffness.  Skin: Negative for itching and rash.  Neurological: Negative for dizziness, extremity weakness, gait problem, headaches, light-headedness and seizures.  Hematological: Negative for adenopathy. Does not bruise/bleed easily.  Psychiatric/Behavioral: Negative for confusion, depression and sleep disturbance. The patient is not nervous/anxious.     PHYSICAL EXAMINATION:  There were no vitals taken for this visit.  ECOG PERFORMANCE STATUS: {CHL ONC ECOG [id number]  Physical Exam  Constitutional: Oriented to person, place, and time and well-developed, well-nourished, and in no distress. No distress.  HENT:  Head: Normocephalic and atraumatic.  Mouth/Throat: Oropharynx is clear and moist. No oropharyngeal exudate.  Eyes: Conjunctivae are normal. Right eye exhibits no discharge. Left eye exhibits no discharge. No scleral icterus.  Neck: Normal range of motion. Neck supple.  Cardiovascular: Normal rate, regular rhythm, normal heart sounds and intact distal pulses.   Pulmonary/Chest: Effort normal and breath sounds normal. No respiratory distress. No wheezes. No rales.  Abdominal: Soft. Bowel sounds are normal. Exhibits no distension and no mass. There is no tenderness.  Musculoskeletal: Normal range of motion. Exhibits no edema.  Lymphadenopathy:    No cervical adenopathy.  Neurological: Alert and oriented to person, place, and time. Exhibits normal muscle tone. Gait normal. Coordination normal.  Skin: Skin is warm  and dry. No rash noted. Not diaphoretic. No erythema. No pallor.  Psychiatric: Mood, memory and judgment normal.  Vitals reviewed.  LABORATORY DATA: Lab Results  Component Value Date   WBC 6.3 05/28/2019   HGB 14.2 05/28/2019   HCT 42.9 05/28/2019   MCV 99.3 05/28/2019   PLT 403 (H) 05/28/2019      Chemistry      Component Value Date/Time   NA 133 (L) 05/28/2019 1204   NA 138 01/11/2017 1343   K 3.5 05/28/2019 1204    K 4.7 01/11/2017 1343   CL 100 05/28/2019 1204   CO2 23 05/28/2019 1204   CO2 23 01/11/2017 1343   BUN 10 05/28/2019 1204   BUN 20.2 01/11/2017 1343   CREATININE 1.47 (H) 05/28/2019 1204   CREATININE 1.6 (H) 01/11/2017 1343      Component Value Date/Time   CALCIUM 8.7 (L) 05/28/2019 1204   CALCIUM 9.3 01/11/2017 1343   ALKPHOS 91 05/28/2019 1204   ALKPHOS 89 01/11/2017 1343   AST 41 05/28/2019 1204   AST 41 (H) 01/11/2017 1343   ALT 77 (H) 05/28/2019 1204   ALT 27 01/11/2017 1343   BILITOT 0.7 05/28/2019 1204   BILITOT 0.49 01/11/2017 1343       RADIOGRAPHIC STUDIES:  No results found.   ASSESSMENT/PLAN:  This is a very pleasant 64 year old African American male with metastatic disease who was initially diagnosed with stage IB non-small cell lung cancer, adenocarcinoma of the left upper lobe. Molecular studies showed no actionable mutations. PD-L1 expression was negative. First diagnosed December 2017.  He was status post a wedge resection of the left upper lobe followed by 4 cycles of adjuvant systemic chemotherapy with cisplatin and Alimta. He tolerated treatment well except for fatigue.  He had been on observationuntilJune of 2018.  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  The is currently undergoing systemic chemotherapy with carboplatin, alimta, and keytruda. He is status post 26 cycles. Starting from cycle #5, he has been on maintenance Alimta and Keytruda every 3 weeks.  The patient was seen with Dr. Julien Nordmann today. Labs were reviewed. Recommend that he proceed with cycle #27 today as scheduled.   I will arrange for a restaging CT scan prior to his next visit.   We will see him back for a follow up visit in 3 weeks for evaluation before starting cycle #28.   Regarding his fatigue and insomnia?   The patient was  advised to call immediately if he has any concerning symptoms in the interval. The patient voices understanding of current disease status and treatment options and is in agreement with the current care plan. All questions were answered. The patient knows to call the clinic with any problems, questions or concerns. We can certainly see the patient much sooner if necessary   No orders of the defined types were placed in this encounter.    William L Heilingoetter, PA-C 06/17/19

## 2019-06-17 NOTE — Telephone Encounter (Signed)
Received call from patient's wife that he is having some difficulty sleeping, fatigue and slight diarrhea that appears to have resolved. She stated patient is not running any fever or having any nausea or vomiting.   Next physician visit is tomorrow 06/18/19.  Advised wife to encourage patient to rest, drink lots of water and keep visit with Cassie, PA tomorrow. She also knows to have patient take Imodium if diarrhea resumes or call back if symptoms worsens.

## 2019-06-18 ENCOUNTER — Inpatient Hospital Stay: Payer: No Typology Code available for payment source

## 2019-06-18 ENCOUNTER — Inpatient Hospital Stay: Payer: No Typology Code available for payment source | Admitting: Physician Assistant

## 2019-06-18 ENCOUNTER — Telehealth: Payer: Self-pay | Admitting: Cardiology

## 2019-06-18 NOTE — Telephone Encounter (Signed)
The patient called and said that [name] wanted to see him at the conclusion of his Chemo treatments.  He has 2 sessions left which are 3 weeks apart so he looking to be scheduled in roughly 6 weeks.  I told him that I would forward to Dr Francesca Oman nurse to schedule.

## 2019-06-18 NOTE — Telephone Encounter (Signed)
Patient's wife calling stating the patient needs to be seen by Dr. Meda Coffee and not a PA as soon as possible. She says it is not an emergency, but he needs to be squeezed in. She states the patient is having symptoms, but states she did not want to speak with me about them. Please advise.

## 2019-06-22 ENCOUNTER — Telehealth: Payer: Self-pay | Admitting: Medical Oncology

## 2019-06-22 NOTE — Telephone Encounter (Signed)
Diarrhea resolved. His appetite is better and he is up and around a little bit more. He is drinking more fluid.   He dozes off and falls asleep but he cannot stay asleep x several days.

## 2019-06-22 NOTE — Telephone Encounter (Signed)
Okay for melatonin or Tylenol PM.  Thank you.

## 2019-06-23 NOTE — Telephone Encounter (Signed)
LVM for [place] with Mohamed's recommendations.

## 2019-06-24 NOTE — Telephone Encounter (Signed)
Binnie Rail M, LPN  Good morning,  I called patient and scheduled him for 4/15 at 10:20am with Dr. Meda Coffee.

## 2019-06-29 ENCOUNTER — Other Ambulatory Visit: Payer: Self-pay | Admitting: *Deleted

## 2019-06-29 DIAGNOSIS — I714 Abdominal aortic aneurysm, without rupture, unspecified: Secondary | ICD-10-CM

## 2019-06-30 ENCOUNTER — Ambulatory Visit (HOSPITAL_COMMUNITY)
Admission: RE | Admit: 2019-06-30 | Discharge: 2019-06-30 | Disposition: A | Discharge status: Home / Self Care | Payer: No Typology Code available for payment source | Source: Ambulatory Visit | Attending: Vascular Surgery | Admitting: Vascular Surgery

## 2019-06-30 ENCOUNTER — Other Ambulatory Visit: Payer: Self-pay

## 2019-06-30 ENCOUNTER — Ambulatory Visit (INDEPENDENT_AMBULATORY_CARE_PROVIDER_SITE_OTHER): Payer: No Typology Code available for payment source | Admitting: Physician Assistant

## 2019-06-30 VITALS — BP 136/78 | HR 90 | Temp 97.0°F | Resp 18 | Ht 75.5 in | Wt 149.7 lb

## 2019-06-30 DIAGNOSIS — I714 Abdominal aortic aneurysm, without rupture, unspecified: Secondary | ICD-10-CM

## 2019-06-30 NOTE — Progress Notes (Signed)
Established EVAR   History of Present Illness   [name]. is a 64 y.o. (23-Jul-1955) male who presents for routine follow up s/p EVAR (Date: 02/2016) by Dr. Bridgett Larsson.  Aneurysm at that point was greater than 6cm in diameter.  He denies new or changing abd or back pain.  He also denies any claudication, rest pain, or tissue  Changes of BLE.  He is on chemotherapy treatment 25 of 27 for stage IV lung cancer.  He is also following with new PCP for management of HTN.  He is a former tobacco user.  The patient's PMH, PSH, SH, and FamHx were reviewed and are unchanged from prior visit.  Current Outpatient Medications  Medication Sig Dispense Refill  . amLODipine (NORVASC) 2.5 MG tablet Take 2.5 mg by mouth at bedtime.    . folic acid (FOLVITE) 1 MG tablet TAKE 1 TABLET BY MOUTH EVERY DAY 90 tablet 1  . lisinopril (PRINIVIL,ZESTRIL) 2.5 MG tablet Take 1 tablet (2.5 mg total) by mouth daily. 30 tablet 11   No current facility-administered medications for this visit.    REVIEW OF SYSTEMS (negative unless checked):   Cardiac:  []  Chest pain or chest pressure? []  Shortness of breath upon activity? []  Shortness of breath when lying flat? []  Irregular heart rhythm?  Vascular:  []  Pain in calf, thigh, or hip brought on by walking? []  Pain in feet at night that wakes you up from your sleep? []  Blood clot in your veins? []  Leg swelling?  Pulmonary:  []  Oxygen at home? []  Productive cough? []  Wheezing?  Neurologic:  []  Sudden weakness in arms or legs? []  Sudden numbness in arms or legs? []  Sudden onset of difficult speaking or slurred speech? []  Temporary loss of vision in one eye? []  Problems with dizziness?  Gastrointestinal:  []  Blood in stool? []  Vomited blood?  Genitourinary:  []  Burning when urinating? []  Blood in urine?  Psychiatric:  []  Major depression  Hematologic:  []  Bleeding problems? []  Problems with blood clotting?  Dermatologic:  []  Rashes or  ulcers?  Constitutional:  []  Fever or chills?  Ear/Nose/Throat:  []  Change in hearing? []  Nose bleeds? []  Sore throat?  Musculoskeletal:  []  Back pain? []  Joint pain? []  Muscle pain?   Physical Examination   Vitals:   06/30/19 0949  BP: 136/78  Pulse: 90  Resp: 18  Temp: (!) 97 F (36.1 C)  TempSrc: Temporal  SpO2: 99%  Weight: 149 lb 11.2 oz (67.9 kg)  Height: 6' 3.5" (1.918 m)   Body mass index is 18.46 kg/m.  General:  WDWN in NAD; vital signs documented above Gait: Not observed HENT: WNL, normocephalic Pulmonary: normal non-labored breathing , without Rales, rhonchi,  wheezing Cardiac: regular HR Abdomen: soft, NT, no masses Skin: without rashes Vascular Exam/Pulses:  Right Left  Radial 2+ (normal) 2+ (normal)  DP 2+ (normal) 1+ (weak)  PT 1+ (weak) 1+ (weak)   Extremities: without ischemic changes, without Gangrene , without cellulitis; without open wounds;  Musculoskeletal: no muscle wasting or atrophy  Neurologic: A&O X 3;  No focal weakness or paresthesias are detected Psychiatric:  The pt has Normal affect.  Non-Invasive Vascular Imaging   EVAR Duplex   AAA sac size: 3.5 cm at largest diameter  no endoleak detected   Medical Decision Making   [name]. is a 64 y.o. male who presents for routine surveillance s/p EVAR 02/2016.    Patent stent graft without endoleaks  on duplex  AAA sac size 3.5cm at largest diameter which is decreased form 4cm 1 year ago  Recheck EVAR duplex in 1 year   William Ligas PA-C [facility] Office: [facility] MD: Carlis Abbott

## 2019-07-01 ENCOUNTER — Other Ambulatory Visit: Payer: Self-pay | Admitting: *Deleted

## 2019-07-01 DIAGNOSIS — I714 Abdominal aortic aneurysm, without rupture, unspecified: Secondary | ICD-10-CM

## 2019-07-09 ENCOUNTER — Encounter: Payer: Self-pay | Admitting: Internal Medicine

## 2019-07-09 ENCOUNTER — Other Ambulatory Visit: Payer: Self-pay

## 2019-07-09 ENCOUNTER — Inpatient Hospital Stay: Payer: No Typology Code available for payment source

## 2019-07-09 ENCOUNTER — Inpatient Hospital Stay (HOSPITAL_BASED_OUTPATIENT_CLINIC_OR_DEPARTMENT_OTHER): Payer: No Typology Code available for payment source | Admitting: Internal Medicine

## 2019-07-09 ENCOUNTER — Inpatient Hospital Stay: Payer: No Typology Code available for payment source | Attending: Internal Medicine

## 2019-07-09 VITALS — BP 124/72 | HR 92 | Temp 97.4°F | Resp 18 | Ht 75.5 in | Wt 157.5 lb

## 2019-07-09 DIAGNOSIS — Z5111 Encounter for antineoplastic chemotherapy: Secondary | ICD-10-CM | POA: Diagnosis present

## 2019-07-09 DIAGNOSIS — Z5112 Encounter for antineoplastic immunotherapy: Secondary | ICD-10-CM | POA: Insufficient documentation

## 2019-07-09 DIAGNOSIS — I10 Essential (primary) hypertension: Secondary | ICD-10-CM | POA: Insufficient documentation

## 2019-07-09 DIAGNOSIS — C7951 Secondary malignant neoplasm of bone: Secondary | ICD-10-CM | POA: Diagnosis not present

## 2019-07-09 DIAGNOSIS — C3490 Malignant neoplasm of unspecified part of unspecified bronchus or lung: Secondary | ICD-10-CM

## 2019-07-09 DIAGNOSIS — Z79899 Other long term (current) drug therapy: Secondary | ICD-10-CM | POA: Diagnosis not present

## 2019-07-09 DIAGNOSIS — C3492 Malignant neoplasm of unspecified part of left bronchus or lung: Secondary | ICD-10-CM

## 2019-07-09 DIAGNOSIS — C3432 Malignant neoplasm of lower lobe, left bronchus or lung: Secondary | ICD-10-CM

## 2019-07-09 LAB — CMP (CANCER CENTER ONLY)
ALT: 18 U/L (ref 0–44)
AST: 34 U/L (ref 15–41)
Albumin: 3 g/dL — ABNORMAL LOW (ref 3.5–5.0)
Alkaline Phosphatase: 85 U/L (ref 38–126)
Anion gap: 12 (ref 5–15)
BUN: 8 mg/dL (ref 8–23)
CO2: 20 mmol/L — ABNORMAL LOW (ref 22–32)
Calcium: 8.6 mg/dL — ABNORMAL LOW (ref 8.9–10.3)
Chloride: 104 mmol/L (ref 98–111)
Creatinine: 1.1 mg/dL (ref 0.61–1.24)
GFR, Est AFR Am: >60 mL/min (ref >60)
GFR, Estimated: >60 mL/min (ref >60)
Glucose, Bld: 118 mg/dL — ABNORMAL HIGH (ref 70–99)
Potassium: 4.1 mmol/L (ref 3.5–5.1)
Sodium: 136 mmol/L (ref 135–145)
Total Bilirubin: 0.5 mg/dL (ref 0.3–1.2)
Total Protein: 6.7 g/dL (ref 6.5–8.1)

## 2019-07-09 LAB — CBC WITH DIFFERENTIAL (CANCER CENTER ONLY)
Abs Immature Granulocytes: 0.02 10*3/uL (ref 0.00–0.07)
Basophils Absolute: 0 10*3/uL (ref 0.0–0.1)
Basophils Relative: 1 %
Eosinophils Absolute: 0.2 10*3/uL (ref 0.0–0.5)
Eosinophils Relative: 3 %
HCT: 40.4 % (ref 39.0–52.0)
Hemoglobin: 13.2 g/dL (ref 13.0–17.0)
Immature Granulocytes: 0 %
Lymphocytes Relative: 16 %
Lymphs Abs: 1.2 10*3/uL (ref 0.7–4.0)
MCH: 32.4 pg (ref 26.0–34.0)
MCHC: 32.7 g/dL (ref 30.0–36.0)
MCV: 99.3 fL (ref 80.0–100.0)
Monocytes Absolute: 0.9 10*3/uL (ref 0.1–1.0)
Monocytes Relative: 12 %
Neutro Abs: 5.4 10*3/uL (ref 1.7–7.7)
Neutrophils Relative %: 68 %
Platelet Count: 318 10*3/uL (ref 150–400)
RBC: 4.07 MIL/uL — ABNORMAL LOW (ref 4.22–5.81)
RDW: 15.3 % (ref 11.5–15.5)
WBC Count: 7.8 10*3/uL (ref 4.0–10.5)
nRBC: 0 % (ref 0.0–0.2)

## 2019-07-09 LAB — TSH: TSH: 2.614 u[IU]/mL (ref 0.320–4.118)

## 2019-07-09 MED ORDER — SODIUM CHLORIDE 0.9 % IV SOLN
500.0000 mg/m2 | Freq: Once | INTRAVENOUS | Status: AC
  Administered 2019-07-09: 1000 mg via INTRAVENOUS
  Filled 2019-07-09: qty 40

## 2019-07-09 MED ORDER — ONDANSETRON HCL 4 MG/2ML IJ SOLN
INTRAMUSCULAR | Status: AC
  Filled 2019-07-09: qty 4

## 2019-07-09 MED ORDER — DEXAMETHASONE SODIUM PHOSPHATE 10 MG/ML IJ SOLN
10.0000 mg | Freq: Once | INTRAMUSCULAR | Status: AC
  Administered 2019-07-09: 10 mg via INTRAVENOUS

## 2019-07-09 MED ORDER — CYANOCOBALAMIN 1000 MCG/ML IJ SOLN
INTRAMUSCULAR | Status: AC
  Filled 2019-07-09: qty 1

## 2019-07-09 MED ORDER — SODIUM CHLORIDE 0.9 % IV SOLN
200.0000 mg | Freq: Once | INTRAVENOUS | Status: AC
  Administered 2019-07-09: 200 mg via INTRAVENOUS
  Filled 2019-07-09: qty 8

## 2019-07-09 MED ORDER — CYANOCOBALAMIN 1000 MCG/ML IJ SOLN
1000.0000 ug | Freq: Once | INTRAMUSCULAR | Status: AC
  Administered 2019-07-09: 1000 ug via INTRAMUSCULAR

## 2019-07-09 MED ORDER — SODIUM CHLORIDE 0.9% FLUSH
10.0000 mL | INTRAVENOUS | Status: DC | PRN
  Filled 2019-07-09: qty 10

## 2019-07-09 MED ORDER — ONDANSETRON HCL 4 MG/2ML IJ SOLN
8.0000 mg | Freq: Once | INTRAMUSCULAR | Status: AC
  Administered 2019-07-09: 8 mg via INTRAVENOUS

## 2019-07-09 MED ORDER — SODIUM CHLORIDE 0.9 % IV SOLN
Freq: Once | INTRAVENOUS | Status: AC
  Filled 2019-07-09: qty 250

## 2019-07-09 MED ORDER — HEPARIN SOD (PORK) LOCK FLUSH 100 UNIT/ML IV SOLN
500.0000 [IU] | Freq: Once | INTRAVENOUS | Status: DC | PRN
  Filled 2019-07-09: qty 5

## 2019-07-09 MED ORDER — DEXAMETHASONE SODIUM PHOSPHATE 10 MG/ML IJ SOLN
INTRAMUSCULAR | Status: AC
  Filled 2019-07-09: qty 1

## 2019-07-09 NOTE — Patient Instructions (Signed)
[facility] Discharge Instructions for Patients Receiving Chemotherapy  Today you received the following chemotherapy agent/immunotherapy :  Pemetrexed, Pembrolizumab.  To help prevent nausea and vomiting after your treatment, we encourage you to take your nausea medication as prescribed.   If you develop nausea and vomiting that is not controlled by your nausea medication, call the clinic.   BELOW ARE SYMPTOMS THAT SHOULD BE REPORTED IMMEDIATELY:  *FEVER GREATER THAN 100.5 F  *CHILLS WITH OR WITHOUT FEVER  NAUSEA AND VOMITING THAT IS NOT CONTROLLED WITH YOUR NAUSEA MEDICATION  *UNUSUAL SHORTNESS OF BREATH  *UNUSUAL BRUISING OR BLEEDING  TENDERNESS IN MOUTH AND THROAT WITH OR WITHOUT PRESENCE OF ULCERS  *URINARY PROBLEMS  *BOWEL PROBLEMS  UNUSUAL RASH Items with * indicate a potential emergency and should be followed up as soon as possible.  Feel free to call the clinic should you have any questions or concerns. The clinic phone number is [phone].  Please show the [facility] at check-in to the Emergency Department and triage nurse.

## 2019-07-09 NOTE — Progress Notes (Signed)
[facility] [phone]   [phone]  OFFICE PROGRESS NOTE  [name], MD [address] [place] [address]  DIAGNOSIS: Metastatic non-small cell lung cancer, adenocarcinoma initially diagnosed as stage IB (T2a, N0, M0) non-small cell lung cancer, adenocarcinoma diagnosed in December 2017.  The patient has evidence for disease recurrence in July 2019.  Biomarker Findings Tumor Mutational Burden - TMB-Intermediate (16 Muts/Mb) Microsatellite status - MS-Stable Genomic Findings For a complete list of the genes assayed, please refer to the Appendix. KIT amplification KRAS P82U SMAD4 splice site [id number] ER15 I232F 7 Disease relevant genes with no reportable alterations: EGFR, ALK, BRAF, MET, RET, ERBB2, ROS1   PDL 1 expression is 0%  PRIOR THERAPY:  1) status post left lower lobectomy as well as wedge resection of the left upper lobe on 05/11/2016. 2) Adjuvant systemic chemotherapy with cisplatin 75 MG/M2 and Alimta 500 MG/M2 every 3 weeks. First dose 07/03/2016. Status post 4 cycles.  CURRENT THERAPY: Systemic chemotherapy with carboplatin for AUC of 5, Alimta 500 mg/M2 and Keytruda 200 mg IV every 3 weeks.  Status post 26 cycles.  Starting from cycle #5 the patient will be treated with maintenance Alimta and Ketruda (pembrolizumab) every 3 weeks.  INTERVAL HISTORY: [name]. 64 y.o. male returns to the clinic today for follow-up visit.  The patient is feeling fine today with no concerning complaints.  He denied having any current chest pain, shortness of breath, cough or hemoptysis.  He denied having any fever or chills.  He has no nausea, vomiting, diarrhea or constipation.  He denied having any headache or visual changes.  He is here today for evaluation before starting cycle #27 of his treatment.  MEDICAL HISTORY: Past Medical History:  Diagnosis Date  . AAA (abdominal aortic aneurysm) ([facility])   . AAA (abdominal  aortic aneurysm) without rupture ([facility]) 02/04/2014  . Cancer ([facility])    lung  . Encounter for antineoplastic chemotherapy 06/06/2016  . History of hepatitis B   . HNP (herniated nucleus pulposus), lumbar    L4 with radiculopathy  . Hypertension   . Lung cancer ([facility]) 05/18/2016  . Malignant neoplasm of lower lobe of left lung ([facility]) 04/05/2016  . Mass of left lung   . Mitral insufficiency 04/06/2016   This patient will eventually need MV repair if his prognosis is good from oncology standpoint. However, his lung cancer therapy  is the priority right now. His cardiac condition won't preclude possible lung surgery.  Once his lung cancer is under control he will need a TEE to further evaluate his mitral valve anatomy and MR severity, and also have ischemic workup as tere is evidence on calcificati  . Renal insufficiency 10/09/2016  . S/P partial lobectomy of lung 05/11/2016  . Varicose veins of legs     ALLERGIES:  is allergic to tramadol.  MEDICATIONS:  Current Outpatient Medications  Medication Sig Dispense Refill  . amLODipine (NORVASC) 2.5 MG tablet Take 2.5 mg by mouth at bedtime.    . folic acid (FOLVITE) 1 MG tablet TAKE 1 TABLET BY MOUTH EVERY DAY 90 tablet 1  . lisinopril (PRINIVIL,ZESTRIL) 2.5 MG tablet Take 1 tablet (2.5 mg total) by mouth daily. 30 tablet 11   No current facility-administered medications for this visit.    SURGICAL HISTORY:  Past Surgical History:  Procedure Laterality Date  . ABDOMINAL AORTIC ENDOVASCULAR STENT GRAFT N/A 03/12/2016   Procedure: ABDOMINAL AORTIC  ENDOVASCULAR STENT GRAFT;  Surgeon: Conrad Gardena, MD;  Location: [facility];  Service: Vascular;  Laterality: N/A;  . COLONOSCOPY    . INGUINAL HERNIA REPAIR Left 1975   Left inguinal hernia  . INGUINAL HERNIA REPAIR Right   . LOBECTOMY Left 05/11/2016   Procedure: LEFT LOWER LOBE LOBECTOMY AND LEFT UPPER LOBE  RESECTION AND PLACEMENT OF ON-Q;  Surgeon: [name], MD;  Location: [facility];  Service:  Thoracic;  Laterality: Left;  . LUMBAR LAMINECTOMY  October 22, 2012  . LYMPH NODE DISSECTION Left 05/11/2016   Procedure: LYMPH NODE DISSECTION;  Surgeon: [name], MD;  Location: [facility];  Service: Thoracic;  Laterality: Left;  . STAPLING OF BLEBS Left 05/11/2016   Procedure: STAPLING OF APICAL BLEB;  Surgeon: [name], MD;  Location: [facility];  Service: Thoracic;  Laterality: Left;  Marland Kitchen VIDEO ASSISTED THORACOSCOPY Left 05/18/2016   Procedure: VIDEO ASSISTED THORACOSCOPY WITH REMOVAL OF LEFT APICAL BLEB;  Surgeon: [name], MD;  Location: [facility];  Service: Thoracic;  Laterality: Left;  Marland Kitchen VIDEO ASSISTED THORACOSCOPY (VATS)/WEDGE RESECTION Left 05/11/2016   Procedure: LEFT VIDEO ASSISTED THORACOSCOPY (VATS);  Surgeon: [name], MD;  Location: [facility];  Service: Thoracic;  Laterality: Left;  Marland Kitchen VIDEO BRONCHOSCOPY N/A 05/11/2016   Procedure: VIDEO BRONCHOSCOPY, LEFT LUNG;  Surgeon: [name], MD;  Location: [facility];  Service: Thoracic;  Laterality: N/A;  . VIDEO BRONCHOSCOPY N/A 05/18/2016   Procedure: VIDEO BRONCHOSCOPY WITH BRONCHIAL WASHING;  Surgeon: [name], MD;  Location: [facility];  Service: Thoracic;  Laterality: N/A;    REVIEW OF SYSTEMS:  A comprehensive review of systems was negative.   PHYSICAL EXAMINATION: General appearance: alert, cooperative and no distress Head: Normocephalic, without obvious abnormality, atraumatic Neck: no adenopathy, no JVD, supple, symmetrical, trachea midline and thyroid not enlarged, symmetric, no tenderness/mass/nodules Lymph nodes: Cervical, supraclavicular, and axillary nodes normal. Resp: clear to auscultation bilaterally Back: symmetric, no curvature. ROM normal. No CVA tenderness. Cardio: regular rate and rhythm, S1, S2 normal, no murmur, click, rub or gallop GI: soft, non-tender; bowel sounds normal; no masses,  no organomegaly Extremities: extremities normal, atraumatic, no cyanosis or edema   ECOG PERFORMANCE  STATUS: 1 - Symptomatic but completely ambulatory  Blood pressure 124/72, pulse 92, temperature (!) 97.4 F (36.3 C), temperature source Temporal, resp. rate 18, height 6' 3.5" (1.918 m), weight 157 lb 8 oz (71.4 kg), SpO2 100 %.  LABORATORY DATA: Lab Results  Component Value Date   WBC 7.8 07/09/2019   HGB 13.2 07/09/2019   HCT 40.4 07/09/2019   MCV 99.3 07/09/2019   PLT 318 07/09/2019      Chemistry      Component Value Date/Time   NA 133 (L) 05/28/2019 1204   NA 138 01/11/2017 1343   K 3.5 05/28/2019 1204   K 4.7 01/11/2017 1343   CL 100 05/28/2019 1204   CO2 23 05/28/2019 1204   CO2 23 01/11/2017 1343   BUN 10 05/28/2019 1204   BUN 20.2 01/11/2017 1343   CREATININE 1.47 (H) 05/28/2019 1204   CREATININE 1.6 (H) 01/11/2017 1343      Component Value Date/Time   CALCIUM 8.7 (L) 05/28/2019 1204   CALCIUM 9.3 01/11/2017 1343   ALKPHOS 91 05/28/2019 1204   ALKPHOS 89 01/11/2017 1343   AST 41 05/28/2019 1204   AST 41 (H) 01/11/2017 1343   ALT 77 (H) 05/28/2019 1204   ALT 27 01/11/2017 1343  BILITOT 0.7 05/28/2019 1204   BILITOT 0.49 01/11/2017 1343       RADIOGRAPHIC STUDIES: VAS [place] EVAR DUPLEX  Result Date: 06/30/2019 Endovascular Aortic Repair Study (EVAR) Indications: Follow up exam for EVAR. Surgery date 03/12/2016. Risk Factors: Hypertension, past history of smoking.  Performing Technologist: Delorise Shiner RVT  Examination Guidelines: A complete evaluation includes B-mode imaging, spectral Doppler, color Doppler, and power Doppler as needed of all accessible portions of each vessel. Bilateral testing is considered an integral part of a complete examination. Limited examinations for reoccurring indications may be performed as noted.  Abdominal Aorta Findings: +--------+-------+----------+----------+--------+--------+--------+ LocationAP (cm)Trans (cm)PSV (cm/s)WaveformThrombusComments +--------+-------+----------+----------+--------+--------+--------+  Proximal3.35   3.26                                         +--------+-------+----------+----------+--------+--------+--------+ Endovascular Aortic Repair (EVAR): +----------+----------------+-------------------+-------------------+           Diameter AP (cm)Diameter Trans (cm)Velocities (cm/sec) +----------+----------------+-------------------+-------------------+ Aorta     3.50            3.41               77                  +----------+----------------+-------------------+-------------------+ Right Limb1.82            1.88               103                 +----------+----------------+-------------------+-------------------+ Left Limb 1.42            1.37               96                  +----------+----------------+-------------------+-------------------+  Summary: Abdominal Aorta: Patent endovascular aneurysm repair with no evidence of endoleak.  *See table(s) above for measurements and observations.  Electronically signed by Curt Jews MD on 06/30/2019 at 2:27:27 PM.   Final     ASSESSMENT AND PLAN:  This is a very pleasant 64 years old white male with a stage IB non-small cell lung cancer, adenocarcinoma status post wedge resection of the left upper lobe followed by 4 cycles of adjuvant systemic chemotherapy with cisplatin and Alimta and he tolerated his treatment well except for fatigue. The patient has been on observation since June 2018.   The recent imaging studies including CT scan of the chest as well as a PET scan showed evidence for disease recurrence with metastatic disease presented with bilateral pulmonary nodules as well as destructive bone lesions at T4 vertebral body, biopsy proven to be metastatic adenocarcinoma. Molecular studies by foundation 1 showed no actionable mutations.  PDL 1 expression was negative. The patient is currently undergoing treatment with carboplatin, Alimta and Ketruda (pembrolizumab) status post 26 cycles.  Starting from cycle #5 he is  on maintenance treatment with Alimta and Keytruda every 3 weeks. The patient continues to tolerate his treatment fairly well with no concerning adverse effects. I recommended for him to proceed with cycle #27 today as planned. I will see him back for follow-up visit in 3 weeks for evaluation with repeat CT scan of the chest, abdomen pelvis for restaging of his disease. The patient was advised to call immediately if he has any concerning symptoms in the interval. The patient voices understanding of current disease status and treatment options  and is in agreement with the current care plan. All questions were answered. The patient knows to call the clinic with any problems, questions or concerns. We can certainly see the patient much sooner if necessary. Disclaimer: This note was dictated with voice recognition software. Similar sounding words can inadvertently be transcribed and may be missed upon review. Eilleen Kempf, MD 07/09/19

## 2019-07-13 ENCOUNTER — Telehealth: Payer: Self-pay | Admitting: Internal Medicine

## 2019-07-13 NOTE — Telephone Encounter (Signed)
Scheduled per los. Called and spoke with patient. Confirmed appt 

## 2019-07-23 NOTE — Progress Notes (Signed)

## 2019-07-28 ENCOUNTER — Ambulatory Visit (HOSPITAL_COMMUNITY)
Admission: RE | Admit: 2019-07-28 | Discharge: 2019-07-28 | Disposition: A | Discharge status: Home / Self Care | Payer: No Typology Code available for payment source | Source: Ambulatory Visit | Attending: Internal Medicine | Admitting: Internal Medicine

## 2019-07-28 ENCOUNTER — Other Ambulatory Visit: Payer: Self-pay

## 2019-07-28 DIAGNOSIS — C3490 Malignant neoplasm of unspecified part of unspecified bronchus or lung: Secondary | ICD-10-CM | POA: Diagnosis present

## 2019-07-28 MED ORDER — SODIUM CHLORIDE (PF) 0.9 % IJ SOLN
INTRAMUSCULAR | Status: AC
  Filled 2019-07-28: qty 50

## 2019-07-28 MED ORDER — IOHEXOL 300 MG/ML  SOLN
100.0000 mL | Freq: Once | INTRAMUSCULAR | Status: AC | PRN
  Administered 2019-07-28: 13:00:00 100 mL via INTRAVENOUS

## 2019-07-30 ENCOUNTER — Inpatient Hospital Stay: Payer: No Typology Code available for payment source

## 2019-07-30 ENCOUNTER — Inpatient Hospital Stay: Payer: No Typology Code available for payment source | Attending: Internal Medicine | Admitting: Internal Medicine

## 2019-07-30 ENCOUNTER — Encounter: Payer: Self-pay | Admitting: Internal Medicine

## 2019-07-30 ENCOUNTER — Other Ambulatory Visit: Payer: Self-pay

## 2019-07-30 ENCOUNTER — Telehealth: Payer: Self-pay | Admitting: Medical Oncology

## 2019-07-30 VITALS — BP 122/71 | HR 66 | Temp 98.2°F | Resp 18 | Ht 75.5 in | Wt 159.2 lb

## 2019-07-30 DIAGNOSIS — E041 Nontoxic single thyroid nodule: Secondary | ICD-10-CM | POA: Diagnosis not present

## 2019-07-30 DIAGNOSIS — C3492 Malignant neoplasm of unspecified part of left bronchus or lung: Secondary | ICD-10-CM

## 2019-07-30 DIAGNOSIS — Z5112 Encounter for antineoplastic immunotherapy: Secondary | ICD-10-CM | POA: Diagnosis not present

## 2019-07-30 DIAGNOSIS — I10 Essential (primary) hypertension: Secondary | ICD-10-CM

## 2019-07-30 DIAGNOSIS — Z79899 Other long term (current) drug therapy: Secondary | ICD-10-CM | POA: Insufficient documentation

## 2019-07-30 DIAGNOSIS — C3412 Malignant neoplasm of upper lobe, left bronchus or lung: Secondary | ICD-10-CM | POA: Insufficient documentation

## 2019-07-30 DIAGNOSIS — C3432 Malignant neoplasm of lower lobe, left bronchus or lung: Secondary | ICD-10-CM

## 2019-07-30 DIAGNOSIS — Z5111 Encounter for antineoplastic chemotherapy: Secondary | ICD-10-CM

## 2019-07-30 LAB — CBC WITH DIFFERENTIAL (CANCER CENTER ONLY)
Abs Immature Granulocytes: 0.03 10*3/uL (ref 0.00–0.07)
Basophils Absolute: 0 10*3/uL (ref 0.0–0.1)
Basophils Relative: 1 %
Eosinophils Absolute: 0.1 10*3/uL (ref 0.0–0.5)
Eosinophils Relative: 2 %
HCT: 39.5 % (ref 39.0–52.0)
Hemoglobin: 12.8 g/dL — ABNORMAL LOW (ref 13.0–17.0)
Immature Granulocytes: 0 %
Lymphocytes Relative: 13 %
Lymphs Abs: 0.9 10*3/uL (ref 0.7–4.0)
MCH: 32.6 pg (ref 26.0–34.0)
MCHC: 32.4 g/dL (ref 30.0–36.0)
MCV: 100.5 fL — ABNORMAL HIGH (ref 80.0–100.0)
Monocytes Absolute: 1.1 10*3/uL — ABNORMAL HIGH (ref 0.1–1.0)
Monocytes Relative: 15 %
Neutro Abs: 5.2 10*3/uL (ref 1.7–7.7)
Neutrophils Relative %: 69 %
Platelet Count: 488 10*3/uL — ABNORMAL HIGH (ref 150–400)
RBC: 3.93 MIL/uL — ABNORMAL LOW (ref 4.22–5.81)
RDW: 16.1 % — ABNORMAL HIGH (ref 11.5–15.5)
WBC Count: 7.4 10*3/uL (ref 4.0–10.5)
nRBC: 0 % (ref 0.0–0.2)

## 2019-07-30 LAB — CMP (CANCER CENTER ONLY)
ALT: 14 U/L (ref 0–44)
AST: 28 U/L (ref 15–41)
Albumin: 3.1 g/dL — ABNORMAL LOW (ref 3.5–5.0)
Alkaline Phosphatase: 95 U/L (ref 38–126)
Anion gap: 10 (ref 5–15)
BUN: 9 mg/dL (ref 8–23)
CO2: 20 mmol/L — ABNORMAL LOW (ref 22–32)
Calcium: 8.6 mg/dL — ABNORMAL LOW (ref 8.9–10.3)
Chloride: 105 mmol/L (ref 98–111)
Creatinine: 1.16 mg/dL (ref 0.61–1.24)
GFR, Est AFR Am: >60 mL/min (ref >60)
GFR, Estimated: >60 mL/min (ref >60)
Glucose, Bld: 125 mg/dL — ABNORMAL HIGH (ref 70–99)
Potassium: 3.8 mmol/L (ref 3.5–5.1)
Sodium: 135 mmol/L (ref 135–145)
Total Bilirubin: 0.4 mg/dL (ref 0.3–1.2)
Total Protein: 6.8 g/dL (ref 6.5–8.1)

## 2019-07-30 LAB — TSH: TSH: 1.784 u[IU]/mL (ref 0.320–4.118)

## 2019-07-30 MED ORDER — SODIUM CHLORIDE 0.9 % IV SOLN
10.0000 mg | Freq: Once | INTRAVENOUS | Status: AC
  Administered 2019-07-30: 10 mg via INTRAVENOUS
  Filled 2019-07-30: qty 10

## 2019-07-30 MED ORDER — ONDANSETRON HCL 4 MG/2ML IJ SOLN
INTRAMUSCULAR | Status: AC
  Filled 2019-07-30: qty 4

## 2019-07-30 MED ORDER — SODIUM CHLORIDE 0.9 % IV SOLN
500.0000 mg/m2 | Freq: Once | INTRAVENOUS | Status: AC
  Administered 2019-07-30: 1000 mg via INTRAVENOUS
  Filled 2019-07-30: qty 40

## 2019-07-30 MED ORDER — ONDANSETRON HCL 4 MG/2ML IJ SOLN
8.0000 mg | Freq: Once | INTRAMUSCULAR | Status: AC
  Administered 2019-07-30: 8 mg via INTRAVENOUS

## 2019-07-30 MED ORDER — SODIUM CHLORIDE 0.9 % IV SOLN
200.0000 mg | Freq: Once | INTRAVENOUS | Status: AC
  Administered 2019-07-30: 200 mg via INTRAVENOUS
  Filled 2019-07-30: qty 8

## 2019-07-30 MED ORDER — SODIUM CHLORIDE 0.9 % IV SOLN
Freq: Once | INTRAVENOUS | Status: AC
  Filled 2019-07-30: qty 250

## 2019-07-30 NOTE — Telephone Encounter (Signed)
Faxed disability forms -copy mailed to pt.

## 2019-07-30 NOTE — Patient Instructions (Signed)
[facility] Discharge Instructions for Patients Receiving Chemotherapy  Today you received the following chemotherapy agent/immunotherapy :  Pemetrexed, Pembrolizumab.  To help prevent nausea and vomiting after your treatment, we encourage you to take your nausea medication as prescribed.   If you develop nausea and vomiting that is not controlled by your nausea medication, call the clinic.   BELOW ARE SYMPTOMS THAT SHOULD BE REPORTED IMMEDIATELY:  *FEVER GREATER THAN 100.5 F  *CHILLS WITH OR WITHOUT FEVER  NAUSEA AND VOMITING THAT IS NOT CONTROLLED WITH YOUR NAUSEA MEDICATION  *UNUSUAL SHORTNESS OF BREATH  *UNUSUAL BRUISING OR BLEEDING  TENDERNESS IN MOUTH AND THROAT WITH OR WITHOUT PRESENCE OF ULCERS  *URINARY PROBLEMS  *BOWEL PROBLEMS  UNUSUAL RASH Items with * indicate a potential emergency and should be followed up as soon as possible.  Feel free to call the clinic should you have any questions or concerns. The clinic phone number is [phone].  Please show the [facility] at check-in to the Emergency Department and triage nurse.

## 2019-07-30 NOTE — Progress Notes (Signed)
[place] Telephone:([phone]) [phone]   [phone]  OFFICE PROGRESS NOTE  [name], MD [address] [place] [address]  DIAGNOSIS: Metastatic non-small cell lung cancer, adenocarcinoma initially diagnosed as stage IB (T2a, N0, M0) non-small cell lung cancer, adenocarcinoma diagnosed in December 2017.  The patient has evidence for disease recurrence in July 2019.  Biomarker Findings Tumor Mutational Burden - TMB-Intermediate (16 Muts/Mb) Microsatellite status - MS-Stable Genomic Findings For a complete list of the genes assayed, please refer to the Appendix. KIT amplification KRAS E42P SMAD4 splice site [id number] XV40 I232F 7 Disease relevant genes with no reportable alterations: EGFR, ALK, BRAF, MET, RET, ERBB2, ROS1   PDL 1 expression is 0%  PRIOR THERAPY:  1) status post left lower lobectomy as well as wedge resection of the left upper lobe on 05/11/2016. 2) Adjuvant systemic chemotherapy with cisplatin 75 MG/M2 and Alimta 500 MG/M2 every 3 weeks. First dose 07/03/2016. Status post 4 cycles.  CURRENT THERAPY: Systemic chemotherapy with carboplatin for AUC of 5, Alimta 500 mg/M2 and Keytruda 200 mg IV every 3 weeks.  Status post 27 cycles.  Starting from cycle #5 the patient will be treated with maintenance Alimta and Ketruda (pembrolizumab) every 3 weeks.  INTERVAL HISTORY: William Kales [name] 64 y.o. male returns to the clinic today for follow-up visit.  The patient is feeling fine today with no concerning complaints.  He denied having any current chest pain, shortness of breath, cough or hemoptysis.  He denied having any fever or chills.  He has no nausea, vomiting, diarrhea or constipation.  He denied having any headache or visual changes.  He has been tolerating his treatment with maintenance Alimta and Keytruda fairly well.  He has a repeat CT scan of the chest, abdomen pelvis performed recently and he is here today for evaluation  before starting cycle #28 of his treatment.  MEDICAL HISTORY: Past Medical History:  Diagnosis Date   AAA (abdominal aortic aneurysm) ([place])    AAA (abdominal aortic aneurysm) without rupture ([place]) 02/04/2014   Cancer ([place])    lung   Encounter for antineoplastic chemotherapy 06/06/2016   History of hepatitis B    HNP (herniated nucleus pulposus), lumbar    L4 with radiculopathy   Hypertension    Lung cancer ([place]) 05/18/2016   Malignant neoplasm of lower lobe of left lung ([place]) 04/05/2016   Mass of left lung    Mitral insufficiency 04/06/2016   This patient will eventually need MV repair if his prognosis is good from oncology standpoint. However, his lung cancer therapy  is the priority right now. His cardiac condition won't preclude possible lung surgery.  Once his lung cancer is under control he will need a TEE to further evaluate his mitral valve anatomy and MR severity, and also have ischemic workup as tere is evidence on calcificati   Renal insufficiency 10/09/2016   S/P partial lobectomy of lung 05/11/2016   Varicose veins of legs     ALLERGIES:  is allergic to tramadol.  MEDICATIONS:  Current Outpatient Medications  Medication Sig Dispense Refill   amLODipine (NORVASC) 2.5 MG tablet Take 2.5 mg by mouth at bedtime.     folic acid (FOLVITE) 1 MG tablet TAKE 1 TABLET BY MOUTH EVERY DAY 90 tablet 1   lisinopril (PRINIVIL,ZESTRIL) 2.5 MG tablet Take 1 tablet (2.5 mg total) by mouth daily. 30 tablet 11   No current facility-administered medications for this  visit.    SURGICAL HISTORY:  Past Surgical History:  Procedure Laterality Date   ABDOMINAL AORTIC ENDOVASCULAR STENT GRAFT N/A 03/12/2016   Procedure: ABDOMINAL AORTIC ENDOVASCULAR STENT GRAFT;  Surgeon: Conrad [place], MD;  Location: [facility];  Service: Vascular;  Laterality: N/A;   COLONOSCOPY     INGUINAL HERNIA REPAIR Left 1975   Left inguinal hernia   INGUINAL HERNIA REPAIR Right    LOBECTOMY Left  05/11/2016   Procedure: LEFT LOWER LOBE LOBECTOMY AND LEFT UPPER LOBE  RESECTION AND PLACEMENT OF ON-Q;  Surgeon: [name], MD;  Location: [facility];  Service: Thoracic;  Laterality: Left;   LUMBAR LAMINECTOMY  October 22, 2012   LYMPH NODE DISSECTION Left 05/11/2016   Procedure: LYMPH NODE DISSECTION;  Surgeon: [name], MD;  Location: [facility];  Service: Thoracic;  Laterality: Left;   STAPLING OF BLEBS Left 05/11/2016   Procedure: STAPLING OF APICAL BLEB;  Surgeon: [name], MD;  Location: [facility];  Service: Thoracic;  Laterality: Left;   VIDEO ASSISTED THORACOSCOPY Left 05/18/2016   Procedure: VIDEO ASSISTED THORACOSCOPY WITH REMOVAL OF LEFT APICAL BLEB;  Surgeon: [name], MD;  Location: [facility];  Service: Thoracic;  Laterality: Left;   VIDEO ASSISTED THORACOSCOPY (VATS)/WEDGE RESECTION Left 05/11/2016   Procedure: LEFT VIDEO ASSISTED THORACOSCOPY (VATS);  Surgeon: [name], MD;  Location: [facility];  Service: Thoracic;  Laterality: Left;   VIDEO BRONCHOSCOPY N/A 05/11/2016   Procedure: VIDEO BRONCHOSCOPY, LEFT LUNG;  Surgeon: [name], MD;  Location: [facility];  Service: Thoracic;  Laterality: N/A;   VIDEO BRONCHOSCOPY N/A 05/18/2016   Procedure: VIDEO BRONCHOSCOPY WITH BRONCHIAL WASHING;  Surgeon: [name], MD;  Location: [facility];  Service: Thoracic;  Laterality: N/A;    REVIEW OF SYSTEMS:  Constitutional: negative Eyes: negative Ears, nose, mouth, throat, and face: negative Respiratory: negative Cardiovascular: negative Gastrointestinal: negative Genitourinary:negative Integument/breast: negative Hematologic/lymphatic: negative Musculoskeletal:negative Neurological: negative Behavioral/Psych: negative Endocrine: negative Allergic/Immunologic: negative   PHYSICAL EXAMINATION: General appearance: alert, cooperative and no distress Head: Normocephalic, without obvious abnormality, atraumatic Neck: no adenopathy, no JVD, supple, symmetrical,  trachea midline and thyroid not enlarged, symmetric, no tenderness/mass/nodules Lymph nodes: Cervical, supraclavicular, and axillary nodes normal. Resp: clear to auscultation bilaterally Back: symmetric, no curvature. ROM normal. No CVA tenderness. Cardio: regular rate and rhythm, S1, S2 normal, no murmur, click, rub or gallop GI: soft, non-tender; bowel sounds normal; no masses,  no organomegaly Extremities: extremities normal, atraumatic, no cyanosis or edema Neurologic: Alert and oriented X 3, normal strength and tone. Normal symmetric reflexes. Normal coordination and gait   ECOG PERFORMANCE STATUS: 1 - Symptomatic but completely ambulatory  Blood pressure 122/71, pulse 66, temperature 98.2 F (36.8 C), temperature source Temporal, resp. rate 18, height 6' 3.5" (1.918 m), weight 159 lb 3.2 oz (72.2 kg), SpO2 97 %.  LABORATORY DATA: Lab Results  Component Value Date   WBC 7.4 07/30/2019   HGB 12.8 (L) 07/30/2019   HCT 39.5 07/30/2019   MCV 100.5 (H) 07/30/2019   PLT 488 (H) 07/30/2019      Chemistry      Component Value Date/Time   NA 136 07/09/2019 0953   NA 138 01/11/2017 1343   K 4.1 07/09/2019 0953   K 4.7 01/11/2017 1343   CL 104 07/09/2019 0953   CO2 20 (L) 07/09/2019 0953   CO2 23 01/11/2017 1343   BUN 8 07/09/2019 0953   BUN 20.2 01/11/2017 1343   CREATININE  1.10 07/09/2019 0953   CREATININE 1.6 (H) 01/11/2017 1343      Component Value Date/Time   CALCIUM 8.6 (L) 07/09/2019 0953   CALCIUM 9.3 01/11/2017 1343   ALKPHOS 85 07/09/2019 0953   ALKPHOS 89 01/11/2017 1343   AST 34 07/09/2019 0953   AST 41 (H) 01/11/2017 1343   ALT 18 07/09/2019 0953   ALT 27 01/11/2017 1343   BILITOT 0.5 07/09/2019 0953   BILITOT 0.49 01/11/2017 1343       RADIOGRAPHIC STUDIES: CT Chest W Contrast  Result Date: 07/28/2019 CLINICAL DATA:  Non-small cell lung cancer, for restaging. Chemotherapy ongoing. Radiation therapy completed June 2020. EXAM: CT CHEST, ABDOMEN, AND  PELVIS WITH CONTRAST TECHNIQUE: Multidetector CT imaging of the chest, abdomen and pelvis was performed following the standard protocol during bolus administration of intravenous contrast. CONTRAST:  164m OMNIPAQUE IOHEXOL 300 MG/ML  SOLN COMPARISON:  04/14/2019 FINDINGS: CT CHEST FINDINGS Cardiovascular: The heart is normal in size. No pericardial effusion. No evidence of thoracic aortic aneurysm. Ectasia of the proximal descending thoracic aorta, measuring 3.9 cm. Atherosclerotic calcifications of the aortic arch. Mediastinum/Nodes: No suspicious mediastinal or axillary lymphadenopathy. Visualized thyroid is notable for a 9 mm left thyroid nodule. Not clinically significant; no follow-up imaging recommended (ref: J Am Coll Radiol. 2015 Feb;12(2): 143-50). Lungs/Pleura: Biapical pleural-parenchymal scarring. Status post left lower lobectomy. Moderate centrilobular and paraseptal emphysematous changes, upper lung predominant. Subpleural reticulation/fibrosis in the lower lobes. 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 No suspicious pulmonary nodules. No pneumothorax. Musculoskeletal: No focal osseous lesions. CT ABDOMEN PELVIS FINDINGS Hepatobiliary: Sub 5 mm hepatic cysts. Gallbladder is unremarkable. No intrahepatic or extrahepatic ductal dilatation. Pancreas: Within normal limits. Spleen: Within normal limits. Adrenals/Urinary Tract: Adrenal glands are within normal limits. Kidneys are within normal limits. No hydronephrosis. Bladder is unremarkable. Stomach/Bowel: Stomach is within normal limits. No evidence of bowel obstruction. Normal appendix (series 2/image 104). No colonic wall thickening or inflammatory changes. Vascular/Lymphatic: No evidence of abdominal aortic aneurysm. Aorto bi-iliac stent. Atherosclerotic calcifications of the abdominal aorta and branch vessels. No suspicious abdominopelvic lymphadenopathy. Reproductive: Prostate  is unremarkable. Other: No abdominopelvic ascites. Musculoskeletal: Degenerative changes at L4-5. IMPRESSION: Status post left lower lobectomy. No evidence of recurrent or metastatic disease. Stable ancillary findings as above. Aortic Atherosclerosis (ICD10-I70.0) and Emphysema (ICD10-J43.9). Electronically Signed   By: SJulian HyM.D.   On: 07/28/2019 14:25   CT Abdomen Pelvis W Contrast  Result Date: 07/28/2019 CLINICAL DATA:  Non-small cell lung cancer, for restaging. Chemotherapy ongoing. Radiation therapy completed June 2020. EXAM: CT CHEST, ABDOMEN, AND PELVIS WITH CONTRAST TECHNIQUE: Multidetector CT imaging of the chest, abdomen and pelvis was performed following the standard protocol during bolus administration of intravenous contrast. CONTRAST:  1088mOMNIPAQUE IOHEXOL 300 MG/ML  SOLN COMPARISON:  04/14/2019 FINDINGS: CT CHEST FINDINGS Cardiovascular: The heart is normal in size. No pericardial effusion. No evidence of thoracic aortic aneurysm. Ectasia of the proximal descending thoracic aorta, measuring 3.9 cm. Atherosclerotic calcifications of the aortic arch. Mediastinum/Nodes: No suspicious mediastinal or axillary lymphadenopathy. Visualized thyroid is notable for a 9 mm left thyroid nodule. Not clinically significant; no follow-up imaging recommended (ref: J Am Coll Radiol. 2015 Feb;12(2): 143-50). Lungs/Pleura: Biapical pleural-parenchymal scarring. Status post left lower lobectomy. Moderate centrilobular and paraseptal emphysematous changes, upper lung predominant. Subpleural reticulation/fibrosis in the lower lobes. 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 No suspicious pulmonary nodules. No pneumothorax. Musculoskeletal: No focal osseous  lesions. CT ABDOMEN PELVIS FINDINGS Hepatobiliary: Sub 5 mm hepatic cysts. Gallbladder is unremarkable. No intrahepatic or extrahepatic ductal dilatation. Pancreas: Within normal  limits. Spleen: Within normal limits. Adrenals/Urinary Tract: Adrenal glands are within normal limits. Kidneys are within normal limits. No hydronephrosis. Bladder is unremarkable. Stomach/Bowel: Stomach is within normal limits. No evidence of bowel obstruction. Normal appendix (series 2/image 104). No colonic wall thickening or inflammatory changes. Vascular/Lymphatic: No evidence of abdominal aortic aneurysm. Aorto bi-iliac stent. Atherosclerotic calcifications of the abdominal aorta and branch vessels. No suspicious abdominopelvic lymphadenopathy. Reproductive: Prostate is unremarkable. Other: No abdominopelvic ascites. Musculoskeletal: Degenerative changes at L4-5. IMPRESSION: Status post left lower lobectomy. No evidence of recurrent or metastatic disease. Stable ancillary findings as above. Aortic Atherosclerosis (ICD10-I70.0) and Emphysema (ICD10-J43.9). Electronically Signed   By: Julian Hy M.D.   On: 07/28/2019 14:25    ASSESSMENT AND PLAN:  This is a very pleasant 64 years old white male with a stage IB non-small cell lung cancer, adenocarcinoma status post wedge resection of the left upper lobe followed by 4 cycles of adjuvant systemic chemotherapy with cisplatin and Alimta and he tolerated his treatment well except for fatigue. The patient has been on observation since June 2018.   The recent imaging studies including CT scan of the chest as well as a PET scan showed evidence for disease recurrence with metastatic disease presented with bilateral pulmonary nodules as well as destructive bone lesions at T4 vertebral body, biopsy proven to be metastatic adenocarcinoma. Molecular studies by foundation 1 showed no actionable mutations.  PDL 1 expression was negative. The patient is currently undergoing treatment with carboplatin, Alimta and Ketruda (pembrolizumab) status post 27 cycles.  Starting from cycle #5 he is on maintenance treatment with Alimta and Keytruda every 3 weeks. The patient  has been tolerating his treatment well with no concerning adverse effects. He had repeat CT scan of the chest, abdomen pelvis performed recently.  I personally and independently reviewed the scans and discussed the results with the patient today. His scan showed no concerning findings for disease progression. I recommended for the patient to continue on his current maintenance treatment with Alimta and Keytruda and he will proceed with cycle #28 today. He will come back for follow-up visit in 3 weeks for evaluation before the next cycle of his treatment. The patient was advised to call immediately if he has any concerning symptoms in the interval. The patient voices understanding of current disease status and treatment options and is in agreement with the current care plan. All questions were answered. The patient knows to call the clinic with any problems, questions or concerns. We can certainly see the patient much sooner if necessary. Disclaimer: This note was dictated with voice recognition software. Similar sounding words can inadvertently be transcribed and may be missed upon review. Eilleen Kempf, MD 07/30/19

## 2019-07-31 ENCOUNTER — Telehealth: Payer: Self-pay | Admitting: Medical Oncology

## 2019-07-31 ENCOUNTER — Other Ambulatory Visit: Payer: Self-pay | Admitting: Cardiology

## 2019-07-31 NOTE — Telephone Encounter (Signed)
Completed disability  Forms-  fax receipt from [employer] received.. A copy of pts forms are at POD 3 file cabinet. Copies mailed to pt.

## 2019-08-03 ENCOUNTER — Telehealth: Payer: Self-pay | Admitting: Medical Oncology

## 2019-08-03 NOTE — Telephone Encounter (Signed)
Can get COVID vaccine? I called pt and told him to get the vaccine.

## 2019-08-06 ENCOUNTER — Other Ambulatory Visit: Payer: Self-pay

## 2019-08-06 ENCOUNTER — Ambulatory Visit (INDEPENDENT_AMBULATORY_CARE_PROVIDER_SITE_OTHER): Payer: No Typology Code available for payment source | Admitting: Cardiology

## 2019-08-06 ENCOUNTER — Encounter: Payer: Self-pay | Admitting: Cardiology

## 2019-08-06 VITALS — BP 118/68 | HR 111 | Ht 75.5 in | Wt 151.6 lb

## 2019-08-06 DIAGNOSIS — I10 Essential (primary) hypertension: Secondary | ICD-10-CM | POA: Diagnosis not present

## 2019-08-06 DIAGNOSIS — C3432 Malignant neoplasm of lower lobe, left bronchus or lung: Secondary | ICD-10-CM | POA: Diagnosis not present

## 2019-08-06 DIAGNOSIS — I7121 Aneurysm of the ascending aorta, without rupture: Secondary | ICD-10-CM

## 2019-08-06 DIAGNOSIS — I712 Thoracic aortic aneurysm, without rupture: Secondary | ICD-10-CM

## 2019-08-06 DIAGNOSIS — I340 Nonrheumatic mitral (valve) insufficiency: Secondary | ICD-10-CM

## 2019-08-06 MED ORDER — CARVEDILOL 6.25 MG PO TABS: 6.2500 mg | ORAL_TABLET | Freq: Two times a day (BID) | ORAL | 3 refills | Status: DC

## 2019-08-06 NOTE — Patient Instructions (Addendum)
Medication Instructions:  Your physician has recommended you make the following change in your medication:  1.) stop amlodipine 2.) start carvedilol (Coreg) 6.25 mg - one tablet twice daily  *If you need a refill on your cardiac medications before your next appointment, please call your pharmacy*   Lab Work: none If you have labs (blood work) drawn today and your tests are completely normal, you will receive your results only by: [facility] MyChart Message (if you have MyChart) OR . A paper copy in the mail If you have any lab test that is abnormal or we need to change your treatment, we will call you to review the results.   Testing/Procedures: Your physician has requested that you have an echocardiogram. Echocardiography is a painless test that uses sound waves to create images of your heart. It provides your doctor with information about the size and shape of your heart and how well your heart's chambers and valves are working. This procedure takes approximately one hour. There are no restrictions for this procedure.   Follow-Up: At [facility], you and your health needs are our priority.  As part of our continuing mission to provide you with exceptional heart care, we have created designated Provider Care Teams.  These Care Teams include your primary Cardiologist (physician) and Advanced Practice Providers (APPs -  Physician Assistants and Nurse Practitioners) who all work together to provide you with the care you need, when you need it.  Your next appointment:   4 month(s)  The format for your next appointment:   In Person  Provider:   [name], MD   Other Instructions

## 2019-08-06 NOTE — Progress Notes (Signed)
Cardiology Office Note    Date:  08/07/2019   ID:  William Kales [name], DOB 1955/12/13, MRN [id number]  PCP:  [name], MD  Cardiologist:  [name], MD   Chief complain: 1 year follow up  History of Present Illness:  William Gentzler. is a 64 y.o. male, very pleasant patient with a history of of AAA s/p stent graft in 02/2016, 40 PPY history of tobacco abuse, severe mitral regurgitation was diagnosed with a left lung mass on PFT and PET scan. 8 x 3.2 cm superior segment left lower lobe lung mass is hypermetabolic and consistent with primary lung neoplasm in December 2017. No mediastinal or hilar lymphadenopathy and no findings to suggest metastatic disease. 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 The imaging studies in 2019 including CT scan of the chest as well as a PET scan showed evidence for disease recurrence with metastatic disease presented with bilateral pulmonary nodules as well as destructive bone lesions at T4 vertebral body, biopsy proven to be metastatic adenocarcinoma. Molecular studies by foundation 1 showed no actionable mutations.  PDL 1 expression was negative. The patient is currently undergoing treatment with carboplatin, Alimta and Ketruda (pembrolizumab) status post 26 cycles.  Starting from cycle #5 he is on maintenance treatment with Alimta and Keytruda every 3 weeks.  Per Dr. Earlie Server his lesions are shrinking.  He has repeat scans every 3 months.  He states that he has been doing well from cardiac standpoint, denies any chest pain, has stable however improved shortness of breath since last year, he is able to perform all activities of daily living including walking without significant limitations.  He used [place] but has [place].  He  denies any palpitations, no dizziness or syncope no orthopnea proximal nocturnal dyspnea, no lower extremity edema.  Past Medical History:  Diagnosis Date  . AAA (abdominal aortic aneurysm) ([place])   . AAA (abdominal aortic aneurysm) without rupture ([place]) 02/04/2014  . Cancer ([place])    lung  . Encounter for antineoplastic chemotherapy 06/06/2016  . History of hepatitis B   . HNP (herniated nucleus pulposus), lumbar    L4 with radiculopathy  . Hypertension   . Lung cancer ([place]) 05/18/2016  . Malignant neoplasm of lower lobe of left lung ([place]) 04/05/2016  . Mass of left lung   . Mitral insufficiency 04/06/2016   This patient will eventually need MV repair if his prognosis is good from oncology standpoint. However, his lung cancer therapy  is the priority right now. His cardiac condition won't preclude possible lung surgery.  Once his lung cancer is under control he will need a TEE to further evaluate his mitral valve anatomy and MR severity, and also have ischemic workup as tere is evidence on calcificati  . Renal insufficiency 10/09/2016  . S/P partial lobectomy of lung 05/11/2016  . Varicose veins of legs    Past Surgical History:  Procedure Laterality Date  . ABDOMINAL AORTIC ENDOVASCULAR STENT GRAFT N/A 03/12/2016   Procedure: ABDOMINAL AORTIC ENDOVASCULAR STENT GRAFT;  Surgeon: Conrad Fergus, MD;  Location: [facility];  Service: Vascular;  Laterality: N/A;  . COLONOSCOPY    . INGUINAL HERNIA REPAIR Left 1975   Left inguinal hernia  . INGUINAL HERNIA REPAIR Right   . LOBECTOMY  Left 05/11/2016   Procedure: LEFT LOWER LOBE LOBECTOMY AND LEFT UPPER LOBE  RESECTION AND PLACEMENT OF ON-Q;  Surgeon: [name], MD;  Location: [facility];  Service: Thoracic;  Laterality: Left;  . LUMBAR LAMINECTOMY  October 22, 2012  . LYMPH NODE DISSECTION Left 05/11/2016   Procedure: LYMPH NODE DISSECTION;  Surgeon: [name], MD;  Location: [facility];  Service: Thoracic;  Laterality: Left;  . STAPLING OF BLEBS  Left 05/11/2016   Procedure: STAPLING OF APICAL BLEB;  Surgeon: [name], MD;  Location: [facility];  Service: Thoracic;  Laterality: Left;  Marland Kitchen VIDEO ASSISTED THORACOSCOPY Left 05/18/2016   Procedure: VIDEO ASSISTED THORACOSCOPY WITH REMOVAL OF LEFT APICAL BLEB;  Surgeon: [name], MD;  Location: [facility];  Service: Thoracic;  Laterality: Left;  Marland Kitchen VIDEO ASSISTED THORACOSCOPY (VATS)/WEDGE RESECTION Left 05/11/2016   Procedure: LEFT VIDEO ASSISTED THORACOSCOPY (VATS);  Surgeon: [name], MD;  Location: [facility];  Service: Thoracic;  Laterality: Left;  Marland Kitchen VIDEO BRONCHOSCOPY N/A 05/11/2016   Procedure: VIDEO BRONCHOSCOPY, LEFT LUNG;  Surgeon: [name], MD;  Location: [facility];  Service: Thoracic;  Laterality: N/A;  . VIDEO BRONCHOSCOPY N/A 05/18/2016   Procedure: VIDEO BRONCHOSCOPY WITH BRONCHIAL WASHING;  Surgeon: [name], MD;  Location: [facility];  Service: Thoracic;  Laterality: N/A;   Current Medications: Outpatient Medications Prior to Visit  Medication Sig Dispense Refill  . folic acid (FOLVITE) 1 MG tablet TAKE 1 TABLET BY MOUTH EVERY DAY 90 tablet 1  . lisinopril (ZESTRIL) 2.5 MG tablet TAKE 1 TABLET BY MOUTH EVERY DAY 30 tablet 0  . amLODipine (NORVASC) 2.5 MG tablet Take 2.5 mg by mouth at bedtime.     No facility-administered medications prior to visit.    Allergies:   Tramadol   Social History   Socioeconomic History  . Marital status: Married    Spouse name: Not on file  . Number of children: Not on file  . Years of education: Not on file  . Highest education level: Not on file  Occupational History  . Not on file  Tobacco Use  . Smoking status: Former Smoker    Packs/day: 1.00    Types: Cigarettes    Quit date: 03/05/2016    Years since quitting: 3.4  . Smokeless tobacco: Never Used  Substance and Sexual Activity  . Alcohol use: Yes    Alcohol/week: 6.0 standard drinks    Types: 6 Cans of beer per week  . 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 Sexual activity: Not on file  Other Topics Concern  . Not on file  Social History Narrative  . Not on file   Social Determinants of Health   Financial Resource Strain:   . Difficulty of Paying Living Expenses:   Food Insecurity:   . Worried About Charity fundraiser in the Last Year:   . Arboriculturist in the Last Year:   Transportation Needs:   . Film/video editor (Medical):   Marland Kitchen Lack of Transportation (Non-Medical):   Physical Activity:   . Days of Exercise per Week:   . Minutes of Exercise per Session:   Stress:   . Feeling of Stress :   Social Connections:   . Frequency of Communication with Friends and Family:   . Frequency of Social Gatherings with Friends and Family:   . Attends Religious Services:   .  Active Member of Clubs or Organizations:   . Attends Archivist Meetings:   Marland Kitchen Marital Status:     Family History:  The patient's family history includes Diabetes in his mother.   ROS:   Please see the history of present illness.    ROS All other systems reviewed and are negative.  PHYSICAL EXAM:   VS:  BP 118/68   Pulse (!) 111   Ht 6' 3.5" (1.918 m)   Wt 151 lb 9.6 oz (68.8 kg)   SpO2 91%   BMI 18.70 kg/m    GEN: Well nourished, well developed, in no acute distress  HEENT: normal  Neck: no JVD, carotid bruits, or masses Cardiac: RRR; 4/6 systolic murmurs, rubs, or gallops,no edema  Respiratory:  clear to auscultation bilaterally, normal work of breathing GI: soft, nontender, nondistended, + BS MS: no deformity or atrophy  Skin: warm and dry, no rash Neuro:  Alert and Oriented x 3, Strength and sensation are intact Psych: euthymic mood, full affect  Wt Readings from Last 3 Encounters:  08/06/19 151 lb 9.6 oz (68.8 kg)  07/30/19 159 lb 3.2 oz (72.2 kg)  07/09/19 157 lb 8 oz (71.4 kg)    Studies/Labs Reviewed:   EKG:  EKG is ordered today.  It shows normal sinus rhythm with LVH, early  repolarization unchanged from prior. Personally reviewed.  Recent Labs: 07/30/2019: ALT 14; BUN 9; Creatinine 1.16; Hemoglobin 12.8; Platelet Count 488; Potassium 3.8; Sodium 135; TSH 1.784   Lipid Panel No results found for: CHOL, TRIG, HDL, CHOLHDL, VLDL, LDLCALC, LDLDIRECT  Additional studies/ records that were reviewed today include:   TTE: 11/26/2017 - Left ventricle: The cavity size was normal. There was mild   concentric hypertrophy. Systolic function was normal. The   estimated ejection fraction was in the range of 55% to 60%. Wall   motion was normal; there were no regional wall motion   abnormalities. The study is not technically sufficient to allow   evaluation of LV diastolic function. - Aortic valve: There was no regurgitation. - Aorta: Aortic root dimension: 48 mm (ED). Ascending aortic   diameter: 41 mm (S). - Aortic root: The aortic root was moderately dilated. - Mitral valve: Severe prolapse, involving the anterior leaflet.   There was mild contractile dysfunction of the papillary muscles,   with abnormal movement of the posteromedial muscle on parasternal   short axis. There was a solid, mobile mass on the left   ventricular aspect of the tip of the anterior leaflet. Etiology   may be myxomatous degeneration, small section of papillary   muscle, or other. Appears similar to prior study. There was   moderate to severe regurgitation directed eccentrically and   posteriorly. - Left atrium: The atrium was severely dilated. - Tricuspid valve: There was moderate regurgitation. - Pulmonic valve: There was trivial regurgitation. - Pulmonary arteries: PA peak pressure: 42 mm Hg (S).  Impressions:  - Prolapse of the anterior mitral valve leaflet with moderate to   severe eccentric regurgitation. Leaflet is thickened, appears to   have mass on ventricular side, may be small section of papillary   muscle vs. other etiology (degeneration, vegetation, etc).   Recommend TEE  for further evaluation if clinically warranted.  Cardiopulmonary exercise test.  January 2018 Conclusion:Exercise testing with gas exchange demonstrates normal functional capacity when compared to matched sedentary norms. Patient is primarily ventilatory limited and is clearly of obstructive nature. VE/VCO2 is elevated and indicates significant dead space ventilation  most likely due to obstructive lung disease. Patient's measured VO19max is low-normal at 22.1 ml/kg/min and indicates relatively little increased risk associated with lung resection.   ASSESSMENT:    1. Severe mitral regurgitation   2. Ascending aortic aneurysm ([place])   3. Essential hypertension   4. Malignant neoplasm of lower lobe of left lung ([place])     PLAN:  In order of problems listed above:  1.  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 LVEF 60-65%. RVSP 42 mmHg, previously 32 mmHg in August 2018. His further work-up has been placed on hold because of metastatic lung cancer, it appears that he is doing better from oncologic standpoint, we will see him in 3 months when he has follow-up scans for his cancer completed, if continues to improve we will plan for TEE and referral to CT surgery. Repeat an echocardiogram (TTE)  now.  2.  Ascending aortic aneurysm -followed by Dr. Servando Snare, I have reviewed his CT in December 2020 and maximum diameter of his ascending aorta is 42 mm, this is stable..  3.  Essential hypertension -well-controlled on lisinopril and amlodipine, I will continue  4. Lung cancer -as above, followed by Dr. Earlie Server  Follow-up in 3 months with echocardiogram prior to the visit.  Medication Adjustments/Labs and Tests Ordered: Current medicines are reviewed at length with the patient today.  Concerns regarding medicines are outlined above.  Medication changes, Labs and  Tests ordered today are listed in the Patient Instructions below. Patient Instructions  Medication Instructions:  Your physician has recommended you make the following change in your medication:  1.) stop amlodipine 2.) start carvedilol (Coreg) 6.25 mg - one tablet twice daily  *If you need a refill on your cardiac medications before your next appointment, please call your pharmacy*   Lab Work: none If you have labs (blood work) drawn today and your tests are completely normal, you will receive your results only by: Marland Kitchen MyChart Message (if you have MyChart) OR . A paper copy in the mail If you have any lab test that is abnormal or we need to change your treatment, we will call you to review the results.   Testing/Procedures: Your physician has requested that you have an echocardiogram. Echocardiography is a painless test that uses sound waves to create images of your heart. It provides your doctor with information about the size and shape of your heart and how well your heart's chambers and valves are working. This procedure takes approximately one hour. There are no restrictions for this procedure.   Follow-Up: At [facility], you and your health needs are our priority.  As part of our continuing mission to provide you with exceptional heart care, we have created designated Provider Care Teams.  These Care Teams include your primary Cardiologist (physician) and Advanced Practice Providers (APPs -  Physician Assistants and Nurse Practitioners) who all work together to provide you with the care you need, when you need it.  Your next appointment:   4 month(s)  The format for your next appointment:   In Person  Provider:   [name], MD   Other Instructions      Signed, [name], MD  08/07/2019 1:31 PM    [facility] [address], [place], [place]  [address] Phone: [phone]; Fax: [phone]

## 2019-08-06 NOTE — Progress Notes (Signed)
ek 

## 2019-08-07 ENCOUNTER — Ambulatory Visit: Payer: No Typology Code available for payment source | Attending: Internal Medicine

## 2019-08-07 DIAGNOSIS — Z23 Encounter for immunization: Secondary | ICD-10-CM

## 2019-08-07 NOTE — Progress Notes (Signed)
   [id number]  Name:  William PATINO [name]    MRN: [id number] DOB: 11-25-1955  08/07/2019  [name] was observed post Covid-19 immunization for 15 minutes without incident. He was provided with Vaccine Information Sheet and instruction to access the V-Safe system.   [name] was instructed to call 911 with any severe reactions post vaccine: Marland Kitchen Difficulty breathing  . Swelling of face and throat  . A fast heartbeat  . A bad rash all over body  . Dizziness and weakness   Immunizations Administered    Name Date Dose VIS Date Route   Pfizer COVID-19 Vaccine 08/07/2019 10:50 AM 0.3 mL 04/03/2019 Intramuscular   Manufacturer: [place]   Lot: [id number]   [place]: [id number]

## 2019-08-09 ENCOUNTER — Other Ambulatory Visit: Payer: Self-pay | Admitting: Internal Medicine

## 2019-08-14 NOTE — Progress Notes (Signed)
Pharmacist Chemotherapy Monitoring - Follow Up Assessment    I verify that I have reviewed each item in the below checklist:  . Regimen for the patient is scheduled for the appropriate day and plan matches scheduled date. Marland Kitchen Appropriate non-routine labs are ordered dependent on drug ordered. . If applicable, additional medications reviewed and ordered per protocol based on lifetime cumulative doses and/or treatment regimen.   Plan for follow-up and/or issues identified: No . I-vent associated with next due treatment: No . MD and/or nursing notified: No  [name] D 08/14/2019 2:53 PM

## 2019-08-18 ENCOUNTER — Other Ambulatory Visit: Payer: Self-pay

## 2019-08-18 ENCOUNTER — Ambulatory Visit (HOSPITAL_COMMUNITY): Payer: No Typology Code available for payment source | Attending: Internal Medicine

## 2019-08-18 DIAGNOSIS — I340 Nonrheumatic mitral (valve) insufficiency: Secondary | ICD-10-CM | POA: Diagnosis present

## 2019-08-20 ENCOUNTER — Inpatient Hospital Stay: Payer: No Typology Code available for payment source

## 2019-08-20 ENCOUNTER — Encounter: Payer: Self-pay | Admitting: Internal Medicine

## 2019-08-20 ENCOUNTER — Inpatient Hospital Stay (HOSPITAL_BASED_OUTPATIENT_CLINIC_OR_DEPARTMENT_OTHER): Payer: No Typology Code available for payment source | Admitting: Internal Medicine

## 2019-08-20 ENCOUNTER — Other Ambulatory Visit: Payer: Self-pay

## 2019-08-20 VITALS — BP 142/75 | HR 77 | Temp 98.2°F | Resp 18 | Ht 75.5 in | Wt 158.3 lb

## 2019-08-20 DIAGNOSIS — I10 Essential (primary) hypertension: Secondary | ICD-10-CM | POA: Diagnosis not present

## 2019-08-20 DIAGNOSIS — C3492 Malignant neoplasm of unspecified part of left bronchus or lung: Secondary | ICD-10-CM | POA: Diagnosis not present

## 2019-08-20 DIAGNOSIS — C3432 Malignant neoplasm of lower lobe, left bronchus or lung: Secondary | ICD-10-CM

## 2019-08-20 DIAGNOSIS — Z5112 Encounter for antineoplastic immunotherapy: Secondary | ICD-10-CM

## 2019-08-20 DIAGNOSIS — Z5111 Encounter for antineoplastic chemotherapy: Secondary | ICD-10-CM

## 2019-08-20 LAB — CMP (CANCER CENTER ONLY)
ALT: 18 U/L (ref 0–44)
AST: 35 U/L (ref 15–41)
Albumin: 3.1 g/dL — ABNORMAL LOW (ref 3.5–5.0)
Alkaline Phosphatase: 101 U/L (ref 38–126)
Anion gap: 5 (ref 5–15)
BUN: 8 mg/dL (ref 8–23)
CO2: 21 mmol/L — ABNORMAL LOW (ref 22–32)
Calcium: 8.8 mg/dL — ABNORMAL LOW (ref 8.9–10.3)
Chloride: 105 mmol/L (ref 98–111)
Creatinine: 1.13 mg/dL (ref 0.61–1.24)
GFR, Est AFR Am: >60 mL/min (ref >60)
GFR, Estimated: >60 mL/min (ref >60)
Glucose, Bld: 100 mg/dL — ABNORMAL HIGH (ref 70–99)
Potassium: 4.1 mmol/L (ref 3.5–5.1)
Sodium: 131 mmol/L — ABNORMAL LOW (ref 135–145)
Total Bilirubin: 0.3 mg/dL (ref 0.3–1.2)
Total Protein: 7 g/dL (ref 6.5–8.1)

## 2019-08-20 LAB — CBC WITH DIFFERENTIAL (CANCER CENTER ONLY)
Abs Immature Granulocytes: 0.03 10*3/uL (ref 0.00–0.07)
Basophils Absolute: 0.1 10*3/uL (ref 0.0–0.1)
Basophils Relative: 1 %
Eosinophils Absolute: 0.1 10*3/uL (ref 0.0–0.5)
Eosinophils Relative: 2 %
HCT: 41.3 % (ref 39.0–52.0)
Hemoglobin: 13.4 g/dL (ref 13.0–17.0)
Immature Granulocytes: 1 %
Lymphocytes Relative: 15 %
Lymphs Abs: 0.8 10*3/uL (ref 0.7–4.0)
MCH: 32.4 pg (ref 26.0–34.0)
MCHC: 32.4 g/dL (ref 30.0–36.0)
MCV: 99.8 fL (ref 80.0–100.0)
Monocytes Absolute: 1.3 10*3/uL — ABNORMAL HIGH (ref 0.1–1.0)
Monocytes Relative: 24 %
Neutro Abs: 3.2 10*3/uL (ref 1.7–7.7)
Neutrophils Relative %: 57 %
Platelet Count: 513 10*3/uL — ABNORMAL HIGH (ref 150–400)
RBC: 4.14 MIL/uL — ABNORMAL LOW (ref 4.22–5.81)
RDW: 16 % — ABNORMAL HIGH (ref 11.5–15.5)
WBC Count: 5.6 10*3/uL (ref 4.0–10.5)
nRBC: 0 % (ref 0.0–0.2)

## 2019-08-20 LAB — TSH: TSH: 1.754 u[IU]/mL (ref 0.320–4.118)

## 2019-08-20 MED ORDER — SODIUM CHLORIDE 0.9 % IV SOLN
10.0000 mg | Freq: Once | INTRAVENOUS | Status: AC
  Administered 2019-08-20: 10 mg via INTRAVENOUS
  Filled 2019-08-20: qty 10

## 2019-08-20 MED ORDER — ONDANSETRON HCL 4 MG/2ML IJ SOLN
8.0000 mg | Freq: Once | INTRAMUSCULAR | Status: AC
  Administered 2019-08-20: 8 mg via INTRAVENOUS

## 2019-08-20 MED ORDER — SODIUM CHLORIDE 0.9 % IV SOLN
200.0000 mg | Freq: Once | INTRAVENOUS | Status: AC
  Administered 2019-08-20: 200 mg via INTRAVENOUS
  Filled 2019-08-20: qty 8

## 2019-08-20 MED ORDER — ONDANSETRON HCL 4 MG/2ML IJ SOLN
INTRAMUSCULAR | Status: AC
  Filled 2019-08-20: qty 4

## 2019-08-20 MED ORDER — SODIUM CHLORIDE 0.9 % IV SOLN
500.0000 mg/m2 | Freq: Once | INTRAVENOUS | Status: AC
  Administered 2019-08-20: 1000 mg via INTRAVENOUS
  Filled 2019-08-20: qty 40

## 2019-08-20 MED ORDER — SODIUM CHLORIDE 0.9 % IV SOLN
Freq: Once | INTRAVENOUS | Status: AC
  Filled 2019-08-20: qty 250

## 2019-08-20 NOTE — Patient Instructions (Signed)
[facility] Discharge Instructions for Patients Receiving Chemotherapy  Today you received the following chemotherapy agent/immunotherapy :  Pemetrexed, Pembrolizumab.  To help prevent nausea and vomiting after your treatment, we encourage you to take your nausea medication as prescribed.   If you develop nausea and vomiting that is not controlled by your nausea medication, call the clinic.   BELOW ARE SYMPTOMS THAT SHOULD BE REPORTED IMMEDIATELY:  *FEVER GREATER THAN 100.5 F  *CHILLS WITH OR WITHOUT FEVER  NAUSEA AND VOMITING THAT IS NOT CONTROLLED WITH YOUR NAUSEA MEDICATION  *UNUSUAL SHORTNESS OF BREATH  *UNUSUAL BRUISING OR BLEEDING  TENDERNESS IN MOUTH AND THROAT WITH OR WITHOUT PRESENCE OF ULCERS  *URINARY PROBLEMS  *BOWEL PROBLEMS  UNUSUAL RASH Items with * indicate a potential emergency and should be followed up as soon as possible.  Feel free to call the clinic should you have any questions or concerns. The clinic phone number is [phone].  Please show the [place] at check-in to the Emergency Department and triage nurse.

## 2019-08-20 NOTE — Progress Notes (Signed)
[place] [phone]   [phone]) [phone]  OFFICE PROGRESS NOTE  [name], MD [address] [place] [address]  DIAGNOSIS: Metastatic non-small cell lung cancer, adenocarcinoma initially diagnosed as stage IB (T2a, N0, M0) non-small cell lung cancer, adenocarcinoma diagnosed in December 2017.  The patient has evidence for disease recurrence in July 2019.  Biomarker Findings Tumor Mutational Burden - TMB-Intermediate (16 Muts/Mb) Microsatellite status - MS-Stable Genomic Findings For a complete list of the genes assayed, please refer to the Appendix. KIT amplification KRAS G26R SMAD4 splice site [id number] OJ50 I232F 7 Disease relevant genes with no reportable alterations: EGFR, ALK, BRAF, MET, RET, ERBB2, ROS1   PDL 1 expression is 0%  PRIOR THERAPY:  1) status post left lower lobectomy as well as wedge resection of the left upper lobe on 05/11/2016. 2) Adjuvant systemic chemotherapy with cisplatin 75 MG/M2 and Alimta 500 MG/M2 every 3 weeks. First dose 07/03/2016. Status post 4 cycles.  CURRENT THERAPY: Systemic chemotherapy with carboplatin for AUC of 5, Alimta 500 mg/M2 and Keytruda 200 mg IV every 3 weeks.  Status post 28 cycles.  Starting from cycle #5 the patient will be treated with maintenance Alimta and Ketruda (pembrolizumab) every 3 weeks.  INTERVAL HISTORY: William Kales [name] 64 y.o. male returns to the clinic today for follow-up visit.  The patient is feeling fine today with no concerning complaints.  He was seen recently by Dr. Meda Coffee his cardiologist and had 2D echo that showed mitral regurgitation with pulmonary hypertension.  He denied having any current chest pain but has shortness of breath with exertion with no cough or hemoptysis.  The patient denied having any fever or chills.  He has no nausea, vomiting, diarrhea or constipation.  He has no headache or visual changes.  He is here today for evaluation before  starting cycle #29 of his treatment.   MEDICAL HISTORY: Past Medical History:  Diagnosis Date  . AAA (abdominal aortic aneurysm) ([place])   . AAA (abdominal aortic aneurysm) without rupture ([place]) 02/04/2014  . Cancer ([place])    lung  . Encounter for antineoplastic chemotherapy 06/06/2016  . History of hepatitis B   . HNP (herniated nucleus pulposus), lumbar    L4 with radiculopathy  . Hypertension   . Lung cancer ([place]) 05/18/2016  . Malignant neoplasm of lower lobe of left lung ([place]) 04/05/2016  . Mass of left lung   . Mitral insufficiency 04/06/2016   This patient will eventually need MV repair if his prognosis is good from oncology standpoint. However, his lung cancer therapy  is the priority right now. His cardiac condition won't preclude possible lung surgery.  Once his lung cancer is under control he will need a TEE to further evaluate his mitral valve anatomy and MR severity, and also have ischemic workup as tere is evidence on calcificati  . Renal insufficiency 10/09/2016  . S/P partial lobectomy of lung 05/11/2016  . Varicose veins of legs     ALLERGIES:  is allergic to tramadol.  MEDICATIONS:  Current Outpatient Medications  Medication Sig Dispense Refill  . carvedilol (COREG) 6.25 MG tablet Take 1 tablet (6.25 mg total) by mouth 2 (two) times daily. 093 tablet 3  . folic acid (FOLVITE) 1 MG tablet TAKE 1 TABLET BY MOUTH EVERY DAY 90 tablet 1  . lisinopril (ZESTRIL) 2.5 MG tablet TAKE 1 TABLET BY MOUTH EVERY DAY 30 tablet 0   No current  facility-administered medications for this visit.    SURGICAL HISTORY:  Past Surgical History:  Procedure Laterality Date  . ABDOMINAL AORTIC ENDOVASCULAR STENT GRAFT N/A 03/12/2016   Procedure: ABDOMINAL AORTIC ENDOVASCULAR STENT GRAFT;  Surgeon: Conrad [place], MD;  Location: [facility];  Service: Vascular;  Laterality: N/A;  . COLONOSCOPY    . INGUINAL HERNIA REPAIR Left 1975   Left inguinal hernia  . INGUINAL HERNIA REPAIR Right   .  LOBECTOMY Left 05/11/2016   Procedure: LEFT LOWER LOBE LOBECTOMY AND LEFT UPPER LOBE  RESECTION AND PLACEMENT OF ON-Q;  Surgeon: [name], MD;  Location: [facility];  Service: Thoracic;  Laterality: Left;  . LUMBAR LAMINECTOMY  October 22, 2012  . LYMPH NODE DISSECTION Left 05/11/2016   Procedure: LYMPH NODE DISSECTION;  Surgeon: [name], MD;  Location: [facility];  Service: Thoracic;  Laterality: Left;  . STAPLING OF BLEBS Left 05/11/2016   Procedure: STAPLING OF APICAL BLEB;  Surgeon: [name], MD;  Location: [facility];  Service: Thoracic;  Laterality: Left;  Marland Kitchen VIDEO ASSISTED THORACOSCOPY Left 05/18/2016   Procedure: VIDEO ASSISTED THORACOSCOPY WITH REMOVAL OF LEFT APICAL BLEB;  Surgeon: [name], MD;  Location: [facility];  Service: Thoracic;  Laterality: Left;  Marland Kitchen VIDEO ASSISTED THORACOSCOPY (VATS)/WEDGE RESECTION Left 05/11/2016   Procedure: LEFT VIDEO ASSISTED THORACOSCOPY (VATS);  Surgeon: [name], MD;  Location: [facility];  Service: Thoracic;  Laterality: Left;  Marland Kitchen VIDEO BRONCHOSCOPY N/A 05/11/2016   Procedure: VIDEO BRONCHOSCOPY, LEFT LUNG;  Surgeon: [name], MD;  Location: [facility];  Service: Thoracic;  Laterality: N/A;  . VIDEO BRONCHOSCOPY N/A 05/18/2016   Procedure: VIDEO BRONCHOSCOPY WITH BRONCHIAL WASHING;  Surgeon: [name], MD;  Location: [facility];  Service: Thoracic;  Laterality: N/A;    REVIEW OF SYSTEMS:  A comprehensive review of systems was negative except for: Respiratory: positive for dyspnea on exertion   PHYSICAL EXAMINATION: General appearance: alert, cooperative and no distress Head: Normocephalic, without obvious abnormality, atraumatic Neck: no adenopathy, no JVD, supple, symmetrical, trachea midline and thyroid not enlarged, symmetric, no tenderness/mass/nodules Lymph nodes: Cervical, supraclavicular, and axillary nodes normal. Resp: clear to auscultation bilaterally Back: symmetric, no curvature. ROM normal. No CVA tenderness. Cardio:  regular rate and rhythm, S1, S2 normal, no murmur, click, rub or gallop GI: soft, non-tender; bowel sounds normal; no masses,  no organomegaly Extremities: extremities normal, atraumatic, no cyanosis or edema   ECOG PERFORMANCE STATUS: 1 - Symptomatic but completely ambulatory  Blood pressure (!) 142/75, pulse 77, temperature 98.2 F (36.8 C), temperature source Temporal, resp. rate 18, height 6' 3.5" (1.918 m), weight 158 lb 4.8 oz (71.8 kg), SpO2 97 %.  LABORATORY DATA: Lab Results  Component Value Date   WBC 5.6 08/20/2019   HGB 13.4 08/20/2019   HCT 41.3 08/20/2019   MCV 99.8 08/20/2019   PLT 513 (H) 08/20/2019      Chemistry      Component Value Date/Time   NA 135 07/30/2019 1130   NA 138 01/11/2017 1343   K 3.8 07/30/2019 1130   K 4.7 01/11/2017 1343   CL 105 07/30/2019 1130   CO2 20 (L) 07/30/2019 1130   CO2 23 01/11/2017 1343   BUN 9 07/30/2019 1130   BUN 20.2 01/11/2017 1343   CREATININE 1.16 07/30/2019 1130   CREATININE 1.6 (H) 01/11/2017 1343      Component Value Date/Time   CALCIUM 8.6 (L) 07/30/2019 1130   CALCIUM 9.3 01/11/2017  1343   ALKPHOS 95 07/30/2019 1130   ALKPHOS 89 01/11/2017 1343   AST 28 07/30/2019 1130   AST 41 (H) 01/11/2017 1343   ALT 14 07/30/2019 1130   ALT 27 01/11/2017 1343   BILITOT 0.4 07/30/2019 1130   BILITOT 0.49 01/11/2017 1343       RADIOGRAPHIC STUDIES: CT Chest W Contrast  Result Date: 07/28/2019 CLINICAL DATA:  Non-small cell lung cancer, for restaging. Chemotherapy ongoing. Radiation therapy completed June 2020. EXAM: CT CHEST, ABDOMEN, AND PELVIS WITH CONTRAST TECHNIQUE: Multidetector CT imaging of the chest, abdomen and pelvis was performed following the standard protocol during bolus administration of intravenous contrast. CONTRAST:  187m OMNIPAQUE IOHEXOL 300 MG/ML  SOLN COMPARISON:  04/14/2019 FINDINGS: CT CHEST FINDINGS Cardiovascular: The heart is normal in size. No pericardial effusion. No evidence of thoracic  aortic aneurysm. Ectasia of the proximal descending thoracic aorta, measuring 3.9 cm. Atherosclerotic calcifications of the aortic arch. Mediastinum/Nodes: No suspicious mediastinal or axillary lymphadenopathy. Visualized thyroid is notable for a 9 mm left thyroid nodule. Not clinically significant; no follow-up imaging recommended (ref: J Am Coll Radiol. 2015 Feb;12(2): 143-50). Lungs/Pleura: Biapical pleural-parenchymal scarring. Status post left lower lobectomy. Moderate centrilobular and paraseptal emphysematous changes, upper lung predominant. Subpleural reticulation/fibrosis in the lower lobes. 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 No suspicious pulmonary nodules. No pneumothorax. Musculoskeletal: No focal osseous lesions. CT ABDOMEN PELVIS FINDINGS Hepatobiliary: Sub 5 mm hepatic cysts. Gallbladder is unremarkable. No intrahepatic or extrahepatic ductal dilatation. Pancreas: Within normal limits. Spleen: Within normal limits. Adrenals/Urinary Tract: Adrenal glands are within normal limits. Kidneys are within normal limits. No hydronephrosis. Bladder is unremarkable. Stomach/Bowel: Stomach is within normal limits. No evidence of bowel obstruction. Normal appendix (series 2/image 104). No colonic wall thickening or inflammatory changes. Vascular/Lymphatic: No evidence of abdominal aortic aneurysm. Aorto bi-iliac stent. Atherosclerotic calcifications of the abdominal aorta and branch vessels. No suspicious abdominopelvic lymphadenopathy. Reproductive: Prostate is unremarkable. Other: No abdominopelvic ascites. Musculoskeletal: Degenerative changes at L4-5. IMPRESSION: Status post left lower lobectomy. No evidence of recurrent or metastatic disease. Stable ancillary findings as above. Aortic Atherosclerosis (ICD10-I70.0) and Emphysema (ICD10-J43.9). Electronically Signed   By: SJulian HyM.D.   On: 07/28/2019 14:25   CT Abdomen  Pelvis W Contrast  Result Date: 07/28/2019 CLINICAL DATA:  Non-small cell lung cancer, for restaging. Chemotherapy ongoing. Radiation therapy completed June 2020. EXAM: CT CHEST, ABDOMEN, AND PELVIS WITH CONTRAST TECHNIQUE: Multidetector CT imaging of the chest, abdomen and pelvis was performed following the standard protocol during bolus administration of intravenous contrast. CONTRAST:  1078mOMNIPAQUE IOHEXOL 300 MG/ML  SOLN COMPARISON:  04/14/2019 FINDINGS: CT CHEST FINDINGS Cardiovascular: The heart is normal in size. No pericardial effusion. No evidence of thoracic aortic aneurysm. Ectasia of the proximal descending thoracic aorta, measuring 3.9 cm. Atherosclerotic calcifications of the aortic arch. Mediastinum/Nodes: No suspicious mediastinal or axillary lymphadenopathy. Visualized thyroid is notable for a 9 mm left thyroid nodule. Not clinically significant; no follow-up imaging recommended (ref: J Am Coll Radiol. 2015 Feb;12(2): 143-50). Lungs/Pleura: Biapical pleural-parenchymal scarring. Status post left lower lobectomy. Moderate centrilobular and paraseptal emphysematous changes, upper lung predominant. Subpleural reticulation/fibrosis in the lower lobes. 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 No suspicious pulmonary nodules. No pneumothorax. Musculoskeletal: No focal osseous lesions. CT ABDOMEN PELVIS FINDINGS Hepatobiliary: Sub 5 mm hepatic cysts. Gallbladder is unremarkable. No intrahepatic or extrahepatic ductal dilatation. Pancreas: Within normal limits. Spleen: Within normal limits. Adrenals/Urinary Tract:  Adrenal glands are within normal limits. Kidneys are within normal limits. No hydronephrosis. Bladder is unremarkable. Stomach/Bowel: Stomach is within normal limits. No evidence of bowel obstruction. Normal appendix (series 2/image 104). No colonic wall thickening or inflammatory changes. Vascular/Lymphatic: No evidence of  abdominal aortic aneurysm. Aorto bi-iliac stent. Atherosclerotic calcifications of the abdominal aorta and branch vessels. No suspicious abdominopelvic lymphadenopathy. Reproductive: Prostate is unremarkable. Other: No abdominopelvic ascites. Musculoskeletal: Degenerative changes at L4-5. IMPRESSION: Status post left lower lobectomy. No evidence of recurrent or metastatic disease. Stable ancillary findings as above. Aortic Atherosclerosis (ICD10-I70.0) and Emphysema (ICD10-J43.9). Electronically Signed   By: Julian Hy M.D.   On: 07/28/2019 14:25   ECHOCARDIOGRAM COMPLETE  Result Date: 08/18/2019    ECHOCARDIOGRAM REPORT   Patient Name:   William DIFRANCESCO [name] Date of Exam: 08/18/2019 Medical Rec #:  [id number]         Height:       75.5 in Accession #:    [id number]        Weight:       151.6 lb Date of Birth:  16-Aug-1955         BSA:          1.960 m Patient Age:    4 years          BP:           118/68 mmHg Patient Gender: M                 HR:           74 bpm. Exam Location:  [facility] Procedure: 2D Echo, 3D Echo, Cardiac Doppler, Color Doppler and Strain Analysis Indications:    I34 Mitral regurgitation  History:        Patient has prior history of Echocardiogram examinations, most                 recent 06/25/2018. Risk Factors:Hypertension and Former Smoker.                 AAA. Lung cancer.  Sonographer:    Jessee Avers, RDCS Referring Phys: [id number] [place]  1. Left ventricular ejection fraction, by estimation, is 60 to 65%. The left ventricle has normal function. The left ventricle has no regional wall motion abnormalities. There is mild left ventricular hypertrophy. Left ventricular diastolic parameters are consistent with Grade I diastolic dysfunction (impaired relaxation).  2. Right ventricular systolic function is normal. The right ventricular size is normal. There is severely elevated pulmonary artery systolic pressure. The estimated right ventricular systolic pressure  is 12.2 mmHg.  3. Left atrial size was moderately dilated.  4. The mitral valve is myxomatous. Severe mitral valve regurgitation.  5. The aortic valve is tricuspid. Aortic valve regurgitation is not visualized.  6. Aortic dilatation noted. Aneurysm of the ascending aorta, measuring 40 mm. 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  7. The inferior vena cava is normal in size with <50% respiratory variability, suggesting right atrial pressure of 8 mmHg. Comparison(s): 06/25/18 EF 60-65%. Severe MR. RVSP 29 mmHg. Sinus of Valsava measured 3.9 cm on the last study, but now measures 4.9 cm. This previously measured 4.8 cm in 2019. The ascending aorta is stable. FINDINGS  Left Ventricle: Left ventricular ejection fraction, by estimation, is 60 to 65%. The left ventricle has normal function. The left ventricle has no regional wall motion abnormalities. The left ventricular internal  cavity size was normal in size. There is  mild left ventricular hypertrophy. Left ventricular diastolic parameters are consistent with Grade I diastolic dysfunction (impaired relaxation). Indeterminate filling pressures. Right Ventricle: The right ventricular size is normal. No increase in right ventricular wall thickness. Right ventricular systolic function is normal. There is severely elevated pulmonary artery systolic pressure. The tricuspid regurgitant velocity is 3.61 m/s, and with an assumed right atrial pressure of 8 mmHg, the estimated right ventricular systolic pressure is 16.9 mmHg. Left Atrium: Left atrial size was moderately dilated. Right Atrium: Right atrial size was normal in size. Pericardium: There is no evidence of pericardial effusion. 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 There is severe thickening of the anterior mitral valve leaflet(s). Severe mitral valve  regurgitation, with eccentric posteriorly directed jet. Tricuspid Valve: The tricuspid valve is grossly normal. Tricuspid valve regurgitation is mild. Aortic Valve: The aortic valve is tricuspid. Aortic valve regurgitation is not visualized. Pulmonic Valve: The pulmonic valve was normal in structure. Pulmonic valve regurgitation is not visualized. Aorta: Aortic dilatation noted. 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 There is an aneurysm involving the ascending aorta. The aneurysm measures 40 mm. Venous: The inferior vena cava is normal in size with less than 50% respiratory variability, suggesting right atrial pressure of 8 mmHg. IAS/Shunts: No atrial level shunt detected by color flow Doppler.  LEFT VENTRICLE PLAX 2D LVIDd:         5.20 cm  Diastology LVIDs:         3.50 cm  LV e' lateral:   7.51 cm/s LV PW:         1.10 cm  LV E/e' lateral: 12.4 LV IVS:        1.20 cm  LV e' medial:    6.53 cm/s LVOT diam:     2.30 cm  LV E/e' medial:  14.3 LV SV:         52 LV SV Index:   26       2D Longitudinal Strain LVOT Area:     4.15 cm 2D Strain GLS (A2C):   -31.1 %                         2D Strain GLS (A3C):   -22.1 %                         2D Strain GLS (A4C):   -21.1 %                         2D Strain GLS Avg:     -24.8 %                          3D Volume EF:                         3D EF:        60 %                         LV EDV:       217 ml  LV ESV:       87 ml                         LV SV:        130 ml RIGHT VENTRICLE RV Basal diam:  2.90 cm RV S prime:     12.70 cm/s TAPSE (M-mode): 2.5 cm RVSP:           60.1 mmHg LEFT ATRIUM             Index       RIGHT ATRIUM           Index LA diam:        4.40 cm 2.24 cm/m  RA Pressure: 8.00 mmHg LA Vol (A2C):   82.8 ml 42.24 ml/m RA Area:     10.10 cm LA Vol (A4C):   68.0 ml 34.69 ml/m RA Volume:   20.60 ml  10.51 ml/m LA Biplane Vol: 81.1 ml 41.37 ml/m  AORTIC VALVE LVOT Vmax:    75.00 cm/s LVOT Vmean:  51.250 cm/s LVOT VTI:    0.124 m  AORTA Ao Root diam: 4.90 cm Ao Asc diam:  4.00 cm MITRAL VALVE                 TRICUSPID VALVE                              TR Peak grad:   52.1 mmHg                              TR Vmax:        361.00 cm/s MR Peak grad:    173.7 mmHg  Estimated RAP:  8.00 mmHg MR Mean grad:    94.0 mmHg   RVSP:           60.1 mmHg MR Vmax:         659.00 cm/s MR Vmean:        439.0 cm/s  SHUNTS MR PISA:         5.09 cm    Systemic VTI:  0.12 m MR PISA Eff ROA: 30 mm      Systemic Diam: 2.30 cm MR PISA Radius:  0.90 cm MV E velocity: 93.10 cm/s MV A velocity: 103.00 cm/s MV E/A ratio:  0.90 Lyman Bishop MD Electronically signed by Lyman Bishop MD Signature Date/Time: 08/18/2019/9:55:44 PM    Final     ASSESSMENT AND PLAN:  This is a very pleasant 64 years old white male with a stage IB non-small cell lung cancer, adenocarcinoma status post wedge resection of the left upper lobe followed by 4 cycles of adjuvant systemic chemotherapy with cisplatin and Alimta and he tolerated his treatment well except for fatigue. The patient has been on observation since June 2018.   The recent imaging studies including CT scan of the chest as well as a PET scan showed evidence for disease recurrence with metastatic disease presented with bilateral pulmonary nodules as well as destructive bone lesions at T4 vertebral body, biopsy proven to be metastatic adenocarcinoma. Molecular studies by foundation 1 showed no actionable mutations.  PDL 1 expression was negative. The patient is currently undergoing treatment with carboplatin, Alimta and Ketruda (pembrolizumab) status post 28 cycles.  Starting from cycle #5 he is on maintenance treatment with Alimta and Keytruda every 3 weeks.  The patient has been tolerating this treatment well with no concerning adverse effects. I recommended for him to proceed with cycle #29 today as planned. We will see him back for follow-up visit in 3 weeks  for evaluation before the next cycle of his treatment. I will refer the patient to interventional radiology for Port-A-Cath placement for IV access. His next scan will be scheduled in 9 weeks from today for restaging of his disease. The patient was advised to call immediately if he has any concerning symptoms in the interval. The patient voices understanding of current disease status and treatment options and is in agreement with the current care plan. All questions were answered. The patient knows to call the clinic with any problems, questions or concerns. We can certainly see the patient much sooner if necessary. Disclaimer: This note was dictated with voice recognition software. Similar sounding words can inadvertently be transcribed and may be missed upon review. Eilleen Kempf, MD 08/20/19

## 2019-08-21 ENCOUNTER — Telehealth: Payer: Self-pay | Admitting: Medical Oncology

## 2019-08-21 ENCOUNTER — Telehealth: Payer: Self-pay | Admitting: *Deleted

## 2019-08-21 NOTE — Telephone Encounter (Signed)
-----   Message from Rexene Alberts, MD sent at 08/20/2019  6:12 PM EDT ----- I am happy to see him after TEE has been done Perhaps he might be a good candidate for MitraClip ----- Message ----- From: Dorothy Spark, MD Sent: 08/20/2019   3:04 PM EDT To: Nuala Alpha, LPN, Laury Deep, RN, #  Cub,  I would like to refer this nice patient for a mitral valve repair. He has severe mitral regurgitation with now evidence of elevated right sided pressures. He is functional but getting more symptomatic.  He has non-small cell lung cancer but here is a message I received from Dr Julien Nordmann who is his oncologist:  Hi Dr. Meda Coffee,  Mr. Fluegel has stage IV non-small cell lung cancer but fortunately he has been doing very well with his systemic chemotherapy and his disease is currently well controlled. I would strongly recommend for him to have the intervention needed for his heart done. His prognosis is very guarded with the current status of his disease and treatment unless unexpected issues happen.  Thank you.  Julien Nordmann   Would you consider him for a repair? If yes, I would arrange for his TEE in the near future.  Thank you,  [place] Siren

## 2019-08-21 NOTE — Telephone Encounter (Signed)
Spoke with the pt and informed him that based on conversations with his Oncologist and Dr. Roxy Manns at [facility], he is an appropriate candidate to have his mitral valve repaired, therefore we need to start the process with getting this done.  Informed the pt that he will need a TEE done, to further visualize his valves.  Informed the pt this will need to be scheduled as a procedure to be done at the hospital.  Pt education provided on what a TEE is and what it will assess.  Pt states he agrees with getting the TEE done and proceeding with the process in getting his mitral valve repaired, but he prefers coming back into the office to see Dr. Meda Coffee to discuss several questions and concerns he has, before scheduling his TEE.  Pt states if we can bring him into the office to see her, he will then have [place] arrange his TEE at that visit, and will feel much more comfortable with having all his questions answered.  Pt states he is having an upcoming procedure on 5/15 to have his port-a-cath changed, and wants to see her to discuss this, before having the TEE done.  Scheduled the pt to come into the office to see Dr. Meda Coffee to discuss TEE and arrange this, for 09/03/19 at 3:20 pm.  Pt is aware to arrive 15 mins prior to this visit.  Pt verbalized understanding and agrees with this plan.  Pt thanked me multiple times for allowing him to see Dr. Meda Coffee to discuss his plan-of-care, before scheduling his procedure.

## 2019-08-21 NOTE — Telephone Encounter (Signed)
"  Can I get COVID vaccine and port a cath 4 days later?" I told him yes.

## 2019-08-29 ENCOUNTER — Other Ambulatory Visit: Payer: Self-pay | Admitting: Cardiology

## 2019-08-31 ENCOUNTER — Ambulatory Visit: Payer: No Typology Code available for payment source | Attending: Internal Medicine

## 2019-08-31 DIAGNOSIS — Z23 Encounter for immunization: Secondary | ICD-10-CM

## 2019-08-31 NOTE — Progress Notes (Signed)
   [id number]  Name:  William SANTILLI [name]    MRN: [id number] DOB: Aug 10, 1955  08/31/2019  Mr. [name] was observed post Covid-19 immunization for 15 minutes without incident. He was provided with Vaccine Information Sheet and instruction to access the V-Safe system.   Mr. [name] was instructed to call 911 with any severe reactions post vaccine: Marland Kitchen Difficulty breathing  . Swelling of face and throat  . A fast heartbeat  . A bad rash all over body  . Dizziness and weakness   Immunizations Administered    Name Date Dose VIS Date Route   Pfizer COVID-19 Vaccine 08/31/2019 10:35 AM 0.3 mL 06/17/2018 Intramuscular   Manufacturer: [employer]   Lot: [id number]   [place]: [id number]

## 2019-09-01 ENCOUNTER — Other Ambulatory Visit: Payer: Self-pay | Admitting: Medical Oncology

## 2019-09-01 ENCOUNTER — Telehealth: Payer: Self-pay | Admitting: Medical Oncology

## 2019-09-01 DIAGNOSIS — Z95828 Presence of other vascular implants and grafts: Secondary | ICD-10-CM

## 2019-09-01 MED ORDER — LIDOCAINE-PRILOCAINE 2.5-2.5 % EX CREA: TOPICAL_CREAM | CUTANEOUS | Status: DC | PRN

## 2019-09-01 MED ORDER — LIDOCAINE-PRILOCAINE 2.5-2.5 % EX CREA: 1.0000 "application " | TOPICAL_CREAM | CUTANEOUS | 0 refills | Status: DC | PRN

## 2019-09-01 NOTE — Telephone Encounter (Signed)
Appt confirmed for port a cath.

## 2019-09-01 NOTE — Addendum Note (Signed)
Addended by: Ardeen Garland on: 09/01/2019 01:40 PM   Modules accepted: Orders

## 2019-09-03 ENCOUNTER — Encounter: Payer: Self-pay | Admitting: Cardiology

## 2019-09-03 ENCOUNTER — Ambulatory Visit (INDEPENDENT_AMBULATORY_CARE_PROVIDER_SITE_OTHER): Payer: No Typology Code available for payment source | Admitting: Cardiology

## 2019-09-03 ENCOUNTER — Encounter: Payer: Self-pay | Admitting: *Deleted

## 2019-09-03 ENCOUNTER — Other Ambulatory Visit: Payer: Self-pay

## 2019-09-03 ENCOUNTER — Other Ambulatory Visit: Payer: Self-pay | Admitting: Student

## 2019-09-03 VITALS — BP 128/76 | HR 73 | Ht 75.0 in | Wt 155.8 lb

## 2019-09-03 DIAGNOSIS — Z01812 Encounter for preprocedural laboratory examination: Secondary | ICD-10-CM

## 2019-09-03 DIAGNOSIS — I340 Nonrheumatic mitral (valve) insufficiency: Secondary | ICD-10-CM

## 2019-09-03 NOTE — Progress Notes (Signed)
Cardiology Office Note    Date:  09/03/2019   ID:  William Kales [name], DOB 02-Nov-1955, MRN [id number]  PCP:  [name], MD  Cardiologist:  [name], MD   Reason for visit: 4-week follow-up to discuss TEE  History of Present Illness:  William Lage. is a 64 y.o. male, very pleasant patient with a history of of AAA s/p stent graft in 02/2016, 40 PPY history of tobacco abuse, severe mitral regurgitation was diagnosed with a left lung mass on PFT and PET scan. 8 x 3.2 cm superior segment left lower lobe lung mass is hypermetabolic and consistent with primary lung neoplasm in December 2017. No mediastinal or hilar lymphadenopathy and no findings to suggest metastatic disease. 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 The imaging studies in 2019 including CT scan of the chest as well as a PET scan showed evidence for disease recurrence with metastatic disease presented with bilateral pulmonary nodules as well as destructive bone lesions at T4 vertebral body, biopsy proven to be metastatic adenocarcinoma. Molecular studies by foundation 1 showed no actionable mutations.  PDL 1 expression was negative. The patient is currently undergoing treatment with carboplatin, Alimta and Ketruda (pembrolizumab) status post 26 cycles.  Starting from cycle #5 he is on maintenance treatment with Alimta and Keytruda every 3 weeks.  Per Dr. Earlie Server his lesions are shrinking.  He has repeat scans every 3 months.  He states that he has been doing well from cardiac standpoint, denies any chest pain, has stable however improved shortness of breath since last year, he is able to perform all activities of daily living including walking without significant limitations.  He used [place] but has  stopped.  He denies any palpitations, no dizziness or syncope no orthopnea proximal nocturnal dyspnea, no lower extremity edema.  He is coming back to discuss necessity of TEE to evaluate his mitral regurgitation.  I was able to contact his oncologist Dr. Earlie Server who stated that his overall prognosis is good and we should treat him as if he did not have cancer.  The patient has been feeling well, he denies any symptoms  Of dyspnea on exertion, no orthopnea or proximal nocturnal dyspnea.  He remains very active even though he does not work right now and has no symptoms of dyspnea, no palpitations, his lower extremity edema has improved and is currently minimal.  Past Medical History:  Diagnosis Date  . AAA (abdominal aortic aneurysm) ([place])   . AAA (abdominal aortic aneurysm) without rupture ([place]) 02/04/2014  . Cancer ([place])    lung  . Encounter for antineoplastic chemotherapy 06/06/2016  . History of hepatitis B   . HNP (herniated nucleus pulposus), lumbar    L4 with radiculopathy  . Hypertension   . Lung cancer ([place]) 05/18/2016  . Malignant neoplasm of lower lobe of left lung ([place]) 04/05/2016  . Mass of left lung   . Mitral insufficiency 04/06/2016   This patient will eventually need MV repair if his prognosis is good from oncology standpoint. However, his lung cancer therapy  is the priority right now. His cardiac condition won't preclude possible lung surgery.  Once his lung cancer is under control he will need a TEE to further evaluate his mitral valve anatomy and MR severity,  and also have ischemic workup as tere is evidence on calcificati  . Renal insufficiency 10/09/2016  . S/P partial lobectomy of lung 05/11/2016  . Varicose veins of legs    Past Surgical History:  Procedure Laterality Date  . ABDOMINAL AORTIC ENDOVASCULAR STENT GRAFT N/A 03/12/2016   Procedure: ABDOMINAL AORTIC ENDOVASCULAR STENT GRAFT;  Surgeon: Conrad Hopkinton, MD;  Location: [facility];  Service: Vascular;  Laterality: N/A;   . COLONOSCOPY    . INGUINAL HERNIA REPAIR Left 1975   Left inguinal hernia  . INGUINAL HERNIA REPAIR Right   . LOBECTOMY Left 05/11/2016   Procedure: LEFT LOWER LOBE LOBECTOMY AND LEFT UPPER LOBE  RESECTION AND PLACEMENT OF ON-Q;  Surgeon: [name], MD;  Location: [facility];  Service: Thoracic;  Laterality: Left;  . LUMBAR LAMINECTOMY  October 22, 2012  . LYMPH NODE DISSECTION Left 05/11/2016   Procedure: LYMPH NODE DISSECTION;  Surgeon: [name], MD;  Location: [facility];  Service: Thoracic;  Laterality: Left;  . STAPLING OF BLEBS Left 05/11/2016   Procedure: STAPLING OF APICAL BLEB;  Surgeon: [name], MD;  Location: [facility];  Service: Thoracic;  Laterality: Left;  Marland Kitchen VIDEO ASSISTED THORACOSCOPY Left 05/18/2016   Procedure: VIDEO ASSISTED THORACOSCOPY WITH REMOVAL OF LEFT APICAL BLEB;  Surgeon: [name], MD;  Location: [facility];  Service: Thoracic;  Laterality: Left;  Marland Kitchen VIDEO ASSISTED THORACOSCOPY (VATS)/WEDGE RESECTION Left 05/11/2016   Procedure: LEFT VIDEO ASSISTED THORACOSCOPY (VATS);  Surgeon: [name], MD;  Location: [facility];  Service: Thoracic;  Laterality: Left;  Marland Kitchen VIDEO BRONCHOSCOPY N/A 05/11/2016   Procedure: VIDEO BRONCHOSCOPY, LEFT LUNG;  Surgeon: [name], MD;  Location: [facility];  Service: Thoracic;  Laterality: N/A;  . VIDEO BRONCHOSCOPY N/A 05/18/2016   Procedure: VIDEO BRONCHOSCOPY WITH BRONCHIAL WASHING;  Surgeon: [name], MD;  Location: [facility];  Service: Thoracic;  Laterality: N/A;   Current Medications: Outpatient Medications Prior to Visit  Medication Sig Dispense Refill  . carvedilol (COREG) 6.25 MG tablet Take 1 tablet (6.25 mg total) by mouth 2 (two) times daily. 790 tablet 3  . folic acid (FOLVITE) 1 MG tablet TAKE 1 TABLET BY MOUTH EVERY DAY 90 tablet 1  . lidocaine-prilocaine (EMLA) cream Apply 1 application topically as needed. Beginning may June 1st   apply a small amount of emla cream  to a cotton ball and apply over port  site 1-2  Hours prior to chemotherapy . Do not rub in cream . Cover with plastic wrap. 30 g 0  . lisinopril (ZESTRIL) 2.5 MG tablet TAKE 1 TABLET BY MOUTH EVERY DAY 90 tablet 1   No facility-administered medications prior to visit.    Allergies:   Tramadol   Social History   Socioeconomic History  . Marital status: Married    Spouse name: Not on file  . Number of children: Not on file  . Years of education: Not on file  . Highest education level: Not on file  Occupational History  . Not on file  Tobacco Use  . Smoking status: Former Smoker    Packs/day: 1.00    Types: Cigarettes    Quit date: 03/05/2016    Years since quitting: 3.4  . Smokeless tobacco: Never Used  Substance and Sexual Activity  . Alcohol use: Yes    Alcohol/week: 6.0 standard drinks    Types: 6 Cans of beer per week  . 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 Sexual activity: Not on file  Other Topics Concern  . Not on file  Social History Narrative  . Not on file   Social Determinants of Health   Financial Resource Strain:   . Difficulty of Paying Living Expenses:   Food Insecurity:   . Worried About Charity fundraiser in the Last Year:   . Arboriculturist in the Last Year:   Transportation Needs:   . Film/video editor (Medical):   Marland Kitchen Lack of Transportation (Non-Medical):   Physical Activity:   . Days of Exercise per Week:   . Minutes of Exercise per Session:   Stress:   . Feeling of Stress :   Social Connections:   . Frequency of Communication with Friends and Family:   . Frequency of Social Gatherings with Friends and Family:   . Attends Religious Services:   . Active Member of Clubs or Organizations:   . Attends Archivist Meetings:   Marland Kitchen Marital Status:     Family History:  The patient's family history includes Diabetes in his mother.   ROS:   Please see the history of present illness.    ROS All other systems reviewed and are  negative.  PHYSICAL EXAM:   VS:  BP 128/76   Pulse 73   Ht 6\' 3"  (1.905 m)   Wt 155 lb 12.8 oz (70.7 kg)   SpO2 (!) 76%   BMI 19.47 kg/m    GEN: Well nourished, well developed, in no acute distress  HEENT: normal  Neck: no JVD, carotid bruits, or masses Cardiac: RRR; 4/6 systolic murmurs, rubs, or gallops,no edema  Respiratory:  clear to auscultation bilaterally, normal work of breathing GI: soft, nontender, nondistended, + BS MS: no deformity or atrophy  Skin: warm and dry, no rash Neuro:  Alert and Oriented x 3, Strength and sensation are intact Psych: euthymic mood, full affect  Wt Readings from Last 3 Encounters:  09/03/19 155 lb 12.8 oz (70.7 kg)  08/20/19 158 lb 4.8 oz (71.8 kg)  08/06/19 151 lb 9.6 oz (68.8 kg)    Studies/Labs Reviewed:   EKG:  EKG is ordered today.  It shows normal sinus rhythm with LVH, early repolarization unchanged from prior. Personally reviewed.  Recent Labs: 08/20/2019: ALT 18; BUN 8; Creatinine 1.13; Hemoglobin 13.4; Platelet Count 513; Potassium 4.1; Sodium 131; TSH 1.754   Lipid Panel No results found for: CHOL, TRIG, HDL, CHOLHDL, VLDL, LDLCALC, LDLDIRECT  Additional studies/ records that were reviewed today include:   TTE: 11/26/2017 - Left ventricle: The cavity size was normal. There was mild   concentric hypertrophy. Systolic function was normal. The   estimated ejection fraction was in the range of 55% to 60%. Wall   motion was normal; there were no regional wall motion   abnormalities. The study is not technically sufficient to allow   evaluation of LV diastolic function. - Aortic valve: There was no regurgitation. - Aorta: Aortic root dimension: 48 mm (ED). Ascending aortic   diameter: 41 mm (S). - Aortic root: The aortic root was moderately dilated. - Mitral valve: Severe prolapse, involving the anterior leaflet.   There was mild contractile dysfunction of the papillary muscles,   with abnormal movement of the posteromedial  muscle on parasternal   short axis. There was a solid, mobile mass on the left   ventricular aspect of the tip of the anterior leaflet. Etiology   may be  myxomatous degeneration, small section of papillary   muscle, or other. Appears similar to prior study. There was   moderate to severe regurgitation directed eccentrically and   posteriorly. - Left atrium: The atrium was severely dilated. - Tricuspid valve: There was moderate regurgitation. - Pulmonic valve: There was trivial regurgitation. - Pulmonary arteries: PA peak pressure: 42 mm Hg (S).  Impressions:  - Prolapse of the anterior mitral valve leaflet with moderate to   severe eccentric regurgitation. Leaflet is thickened, appears to   have mass on ventricular side, may be small section of papillary   muscle vs. other etiology (degeneration, vegetation, etc).   Recommend TEE for further evaluation if clinically warranted.  Cardiopulmonary exercise test.  January 2018 Conclusion:Exercise testing with gas exchange demonstrates normal functional capacity when compared to matched sedentary norms. Patient is primarily ventilatory limited and is clearly of obstructive nature. VE/VCO2 is elevated and indicates significant dead space ventilation most likely due to obstructive lung disease. Patient's measured VO30max is low-normal at 22.1 ml/kg/min and indicates relatively little increased risk associated with lung resection.   ASSESSMENT:    1. Pre-procedure lab exam   2. Mitral valve insufficiency, unspecified etiology   3. Severe mitral regurgitation      PLAN:  In order of problems listed above:  1.  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 LVEF 60-65%. RVSP 42 mmHg, previously 32 mmHg in August 2018. The patient has been treated for lung cancer by Dr. Earlie Server.  I have communicated with him  patient's plan and he believes he should be treated as if he did not have cancer.  He is responding well to chemotherapy and his lesion has been shrinking.  He is scheduled for another round of PET scanning in July.   We will schedule a TEE for further evaluation even though his valve is clearly myxomatous predominantly anterior mitral leaflet with severe prolapse and severe mitral regurgitation hitting posterior left atrial wall.  We will then refer to Dr. Roxy Manns for further consideration of mitral valve repair.    2.  Ascending aortic aneurysm -followed by Dr. Servando Snare, I have reviewed his CT in December 2020 and maximum diameter of his ascending aorta is 42 mm, this is stable..  3.  Essential hypertension -well-controlled on lisinopril and amlodipine  4. Lung cancer -as above, followed by Dr. Earlie Server  TEE scheduled for October 07, 2019, will then refer to CV surgery, will follow in August 2021..  Medication Adjustments/Labs and Tests Ordered: Current medicines are reviewed at length with the patient today.  Concerns regarding medicines are outlined above.  Medication changes, Labs and Tests ordered today are listed in the Patient Instructions below. Patient Instructions  Medication Instructions:   Your physician recommends that you continue on your current medications as directed. Please refer to the Current Medication list given to you today.  *If you need a refill on your cardiac medications before your next appointment, please call your pharmacy*   Lab Work:  YOUR LAB [facility] 10/05/19 AT 8:30 AM HERE AT THE OFFICE--WE WILL CHECK BMET AND CBC--AFTER THAT YOU WILL GO TO GET YOUR COVID TEST DONE AT 10:35 AM AT [facility]  If you have labs (blood work) drawn today and your tests are completely normal, you will receive your results only by: Marland Kitchen MyChart Message (if you have MyChart) OR . A paper copy  in the mail If you have any lab test that is abnormal or we  need to change your treatment, we will call you to review the results.  Testing/Procedures:  Dear [name] (patient name) You are scheduled for a TEE on 10/07/19 with Dr. Meda Coffee.  Please arrive at the [facility] (Main Entrance A) at [facility]: [address] [place], [place] [address] at 10:30 am. (1 hour prior to procedure unless lab work is needed; if lab work is needed arrive 1.5 hours ahead)  DIET: Nothing to eat or drink after midnight except a sip of water with medications (see medication instructions below)  Labs:   Come to: OUR OFFICE ON 10/05/19 AT 8:30 AM FOR LAB--CBC AND BMET (Lab option #1) Come to the lab at [facility] between the hours of 8:00 am and 4:30 pm. You do not have to be fasting.  YOUR COVID TEST WILL BE AFTER YOUR LAB DONE ON 6/14 AT [place] [facility] [place] WAS LOCATED--COVID TEST IS ON 10/05/19 AT 10:35 AM  PLEASE REPORT TO YOUR COVID TEST ON (TIME AND DATE OF TEST)-YOU WILL GO TO [facility] [address], [place] ([place] USE TO BE)-WHEN YOU PULL INTO [facility] YOU WILL SEE A SECURITY GUARD AT THE ENTRANCE DIRECTING YOU EXACTLY WHERE ALL PRE-PROCEDURE COVID TESTING PATIENTS SHOULD DRIVE UP TO --FOLLOW THE SECURITY GUARDS DIRECTIONS ON WHERE TO GET TESTED THERE. PLEASE BE ON TIME FOR YOUR APPOINTMENT. AFTER YOUR SWAB YOU WILL BE GIVEN A MASK TO WEAR AND INSTRUCTED TO GO HOME AND QUARANTINE/NO VISITORS UNTIL AFTER YOUR PROCEDURE. IF YOU TEST POSITIVE YOU WILL BE NOTIFIED AND YOUR PROCEDURE WILL BE CANCELLED.     You must have a responsible person to drive you home and stay in the waiting area during your procedure. Failure to do so could result in cancellation.  Bring your insurance cards.  *Special Note: Every effort is made to have your procedure done on time. Occasionally there are emergencies that occur at the hospital that may cause delays. Please be patient if a delay  does occur.     Follow-Up:  IN AUGUST AS AN IN PERSON VISIT WITH DR. Meda Coffee       Signed, [name], MD  09/03/2019 4:36 PM    [facility] [place], [place], [place]  [address] Phone: [phone]; Fax: [phone]

## 2019-09-03 NOTE — Patient Instructions (Addendum)
Medication Instructions:   Your physician recommends that you continue on your current medications as directed. Please refer to the Current Medication list given to you today.  *If you need a refill on your cardiac medications before your next appointment, please call your pharmacy*   Lab Work:  YOUR LAB [facility] 10/05/19 AT 8:30 AM HERE AT THE OFFICE--WE WILL CHECK BMET AND CBC--AFTER THAT YOU WILL GO TO GET YOUR COVID TEST DONE AT 10:35 AM AT [facility]  If you have labs (blood work) drawn today and your tests are completely normal, you will receive your results only by: Marland Kitchen MyChart Message (if you have MyChart) OR . A paper copy in the mail If you have any lab test that is abnormal or we need to change your treatment, we will call you to review the results.  Testing/Procedures:  Dear [name] (patient name) You are scheduled for a TEE on 10/07/19 with Dr. Meda Coffee.  Please arrive at the [facility] (Main Entrance A) at [facility]: [address] [place], [place] [address] at 10:30 am. (1 hour prior to procedure unless lab work is needed; if lab work is needed arrive 1.5 hours ahead)  DIET: Nothing to eat or drink after midnight except a sip of water with medications (see medication instructions below)  Labs:   Come to: OUR OFFICE ON 10/05/19 AT 8:30 AM FOR LAB--CBC AND BMET (Lab option #1) Come to the lab at [facility] between the hours of 8:00 am and 4:30 pm. You do not have to be fasting.  YOUR COVID TEST WILL BE AFTER YOUR LAB DONE ON 6/14 AT [place] [facility] [place] WAS LOCATED--COVID TEST IS ON 10/05/19 AT 10:35 AM  PLEASE REPORT TO YOUR COVID TEST ON (TIME AND DATE OF TEST)-YOU WILL GO TO [facility] [address], [place] ([place] USE TO BE)-WHEN YOU PULL INTO [facility] YOU WILL SEE A SECURITY GUARD AT THE ENTRANCE DIRECTING YOU EXACTLY WHERE ALL PRE-PROCEDURE  COVID TESTING PATIENTS SHOULD DRIVE UP TO --FOLLOW THE SECURITY GUARDS DIRECTIONS ON WHERE TO GET TESTED THERE. PLEASE BE ON TIME FOR YOUR APPOINTMENT. AFTER YOUR SWAB YOU WILL BE GIVEN A MASK TO WEAR AND INSTRUCTED TO GO HOME AND QUARANTINE/NO VISITORS UNTIL AFTER YOUR PROCEDURE. IF YOU TEST POSITIVE YOU WILL BE NOTIFIED AND YOUR PROCEDURE WILL BE CANCELLED.     You must have a responsible person to drive you home and stay in the waiting area during your procedure. Failure to do so could result in cancellation.  Bring your insurance cards.  *Special Note: Every effort is made to have your procedure done on time. Occasionally there are emergencies that occur at the hospital that may cause delays. Please be patient if a delay does occur.     Follow-Up:  IN AUGUST AS AN IN PERSON VISIT WITH DR. Meda Coffee

## 2019-09-04 ENCOUNTER — Other Ambulatory Visit: Payer: Self-pay | Admitting: Internal Medicine

## 2019-09-04 ENCOUNTER — Ambulatory Visit (HOSPITAL_COMMUNITY)
Admission: RE | Admit: 2019-09-04 | Discharge: 2019-09-04 | Disposition: A | Discharge status: Home / Self Care | Payer: No Typology Code available for payment source | Source: Ambulatory Visit | Attending: Internal Medicine | Admitting: Internal Medicine

## 2019-09-04 ENCOUNTER — Other Ambulatory Visit: Payer: Self-pay

## 2019-09-04 DIAGNOSIS — Z79899 Other long term (current) drug therapy: Secondary | ICD-10-CM | POA: Diagnosis not present

## 2019-09-04 DIAGNOSIS — C3432 Malignant neoplasm of lower lobe, left bronchus or lung: Secondary | ICD-10-CM | POA: Diagnosis present

## 2019-09-04 DIAGNOSIS — I714 Abdominal aortic aneurysm, without rupture: Secondary | ICD-10-CM | POA: Insufficient documentation

## 2019-09-04 DIAGNOSIS — Z923 Personal history of irradiation: Secondary | ICD-10-CM | POA: Insufficient documentation

## 2019-09-04 DIAGNOSIS — Z9221 Personal history of antineoplastic chemotherapy: Secondary | ICD-10-CM | POA: Insufficient documentation

## 2019-09-04 DIAGNOSIS — I10 Essential (primary) hypertension: Secondary | ICD-10-CM | POA: Insufficient documentation

## 2019-09-04 HISTORY — PX: IR IMAGING GUIDED PORT INSERTION: IMG5740

## 2019-09-04 MED ORDER — MIDAZOLAM HCL 2 MG/2ML IJ SOLN
INTRAMUSCULAR | Status: AC | PRN
  Administered 2019-09-04 (×2): 1 mg via INTRAVENOUS

## 2019-09-04 MED ORDER — CLONIDINE HCL 0.1 MG PO TABS
0.1000 mg | ORAL_TABLET | Freq: Once | ORAL | Status: AC
  Administered 2019-09-04: 0.1 mg via ORAL
  Filled 2019-09-04: qty 1

## 2019-09-04 MED ORDER — MIDAZOLAM HCL 2 MG/2ML IJ SOLN
INTRAMUSCULAR | Status: AC
  Filled 2019-09-04: qty 2

## 2019-09-04 MED ORDER — CEFAZOLIN SODIUM-DEXTROSE 2-4 GM/100ML-% IV SOLN
INTRAVENOUS | Status: AC
  Administered 2019-09-04: 2 g via INTRAVENOUS
  Filled 2019-09-04: qty 100

## 2019-09-04 MED ORDER — FENTANYL CITRATE (PF) 100 MCG/2ML IJ SOLN
INTRAMUSCULAR | Status: AC
  Filled 2019-09-04: qty 2

## 2019-09-04 MED ORDER — LIDOCAINE-EPINEPHRINE 1 %-1:100000 IJ SOLN
INTRAMUSCULAR | Status: AC | PRN
  Administered 2019-09-04 (×2): 10 mL via INTRADERMAL

## 2019-09-04 MED ORDER — CEFAZOLIN SODIUM-DEXTROSE 2-4 GM/100ML-% IV SOLN: 2.0000 g | Freq: Once | INTRAVENOUS | Status: AC

## 2019-09-04 MED ORDER — FENTANYL CITRATE (PF) 100 MCG/2ML IJ SOLN
INTRAMUSCULAR | Status: AC | PRN
  Administered 2019-09-04 (×2): 50 ug via INTRAVENOUS

## 2019-09-04 MED ORDER — LIDOCAINE-EPINEPHRINE 1 %-1:100000 IJ SOLN
INTRAMUSCULAR | Status: AC
  Filled 2019-09-04: qty 1

## 2019-09-04 MED ORDER — HEPARIN SOD (PORK) LOCK FLUSH 100 UNIT/ML IV SOLN
INTRAVENOUS | Status: AC | PRN
  Administered 2019-09-04: 500 [IU] via INTRAVENOUS

## 2019-09-04 MED ORDER — SODIUM CHLORIDE 0.9 % IV SOLN: INTRAVENOUS | Status: DC

## 2019-09-04 MED ORDER — HEPARIN SOD (PORK) LOCK FLUSH 100 UNIT/ML IV SOLN
INTRAVENOUS | Status: AC
  Filled 2019-09-04: qty 5

## 2019-09-04 NOTE — Procedures (Signed)
Pre Procedure Dx: Lung cancer Post Procedural Dx: Same  Successful placement of right IJ approach port-a-cath with tip at the superior caval atrial junction. The catheter is ready for immediate use.  Estimated Blood Loss: Minimal  Complications: None immediate.  Jay Andranik Jeune, MD Pager #: [phone]   

## 2019-09-04 NOTE — Discharge Instructions (Signed)
Do not use your lidocaine cream until your port has healed up. The petroleum in the lidocaine cream will dissolve the skin glue and open the wound over your new port resulting in an infection.    Implanted Port Insertion, Care After This sheet gives you information about how to care for yourself after your procedure. Your health care provider may also give you more specific instructions. If you have problems or questions, contact your health care provider. What can I expect after the procedure? After the procedure, it is common to have:  Discomfort at the port insertion site.  Bruising on the skin over the port. This should improve over 3-4 days. Follow these instructions at home: [facility] care  After your port is placed, you will get a manufacturer's information card. The card has information about your port. Keep this card with you at all times.  Take care of the port as told by your health care provider. Ask your health care provider if you or a family member can get training for taking care of the port at home. A home health care nurse may also take care of the port.  Make sure to remember what type of port you have. Incision care      Follow instructions from your health care provider about how to take care of your port insertion site. Make sure you: ? Wash your hands with soap and water before and after you change your bandage (dressing). If soap and water are not available, use hand sanitizer. ? Change your dressing as told by your health care provider.  Leave  skin glue in place. These skin closures may need to stay in place for 2 weeks or longer. You may remove gauze and clear dressing over your new port tomorrow.  Check your port insertion site every day for signs of infection. Check for: ? Redness, swelling, or pain. ? Fluid or blood. ? Warmth. ? Pus or a bad smell. Activity  Return to your normal activities as told by your health care provider. Ask your health care  provider what activities are safe for you.  Do not lift anything that is heavier than 10 lb (4.5 kg), or the limit that you are told, until your health care provider says that it is safe. General instructions  Take over-the-counter and prescription medicines only as told by your health care provider.  Do not take baths, swim, or use a hot tub until your health care provider approves. You may shower tomorrow.  Do not drive for 24 hours if you were given a sedative during your procedure.  Wear a medical alert bracelet in case of an emergency. This will tell any health care providers that you have a port.  Keep all follow-up visits as told by your health care provider. This is important. Contact a health care provider if:  You cannot flush your port with saline as directed, or you cannot draw blood from the port.  You have a fever or chills.  You have redness, swelling, or pain around your port insertion site.  You have fluid or blood coming from your port insertion site.  Your port insertion site feels warm to the touch.  You have pus or a bad smell coming from the port insertion site. Get help right away if:  You have chest pain or shortness of breath.  You have bleeding from your port that you cannot control. Summary  Take care of the port as told by your health care  provider. Keep the manufacturer's information card with you at all times.  Change your dressing as told by your health care provider.  Contact a health care provider if you have a fever or chills or if you have redness, swelling, or pain around your port insertion site.  Keep all follow-up visits as told by your health care provider. This information is not intended to replace advice given to you by your health care provider. Make sure you discuss any questions you have with your health care provider. Document Revised: 11/05/2017 Document Reviewed: 11/05/2017 Elsevier Patient Education  [place].       Moderate Conscious Sedation, Adult, Care After These instructions provide you with information about caring for yourself after your procedure. Your health care provider may also give you more specific instructions. Your treatment has been planned according to current medical practices, but problems sometimes occur. Call your health care provider if you have any problems or questions after your procedure. What can I expect after the procedure? After your procedure, it is common:  To feel sleepy for several hours.  To feel clumsy and have poor balance for several hours.  To have poor judgment for several hours.  To vomit if you eat too soon. Follow these instructions at home: For at least 24 hours after the procedure:   Do not: ? Participate in activities where you could fall or become injured. ? Drive. ? Use heavy machinery. ? Drink alcohol. ? Take sleeping pills or medicines that cause drowsiness. ? Make important decisions or sign legal documents. ? Take care of children on your own.  Rest. Eating and drinking  Follow the diet recommended by your health care provider.  If you vomit: ? Drink water, juice, or soup when you can drink without vomiting. ? Make sure you have little or no nausea before eating solid foods. General instructions  Have a responsible adult stay with you until you are awake and alert.  Take over-the-counter and prescription medicines only as told by your health care provider.  If you smoke, do not smoke without supervision.  Keep all follow-up visits as told by your health care provider. This is important. Contact a health care provider if:  You keep feeling nauseous or you keep vomiting.  You feel light-headed.  You develop a rash.  You have a fever. Get help right away if:  You have trouble breathing. This information is not intended to replace advice given to you by your health care provider. Make sure you discuss any  questions you have with your health care provider. Document Revised: 03/22/2017 Document Reviewed: 07/30/2015 Elsevier Patient Education  2020 [employer].

## 2019-09-04 NOTE — H&P (Signed)
Referring Physician(s): Mohamed,Mohamed  Supervising Physician: Sandi Mariscal  Patient Status:  [facility] OP  Chief Complaint: "I'm getting a port a cath"   Subjective: Patient familiar to IR service from left lung mass biopsy in 2017 and T4 lytic lesion biopsy in 2019.  He has a history of metastatic adenocarcinoma of the left lung and was initially diagnosed in December 2017 with disease recurrence in July 2019.  He has undergone prior left lower lobectomy as well as wedge resection of the left upper lobe in 2018.  He has also received chemoradiation in past and is currently on chemotherapy.  He has poor venous access and presents today for Port-A-Cath placement.  He denies fever, headache, chest pain, dyspnea, cough, abdominal/back pain, nausea, vomiting or bleeding.  Additional medical history as below. Past Medical History:  Diagnosis Date  . AAA (abdominal aortic aneurysm) ([place])   . AAA (abdominal aortic aneurysm) without rupture ([place]) 02/04/2014  . Cancer ([place])    lung  . Encounter for antineoplastic chemotherapy 06/06/2016  . History of hepatitis B   . HNP (herniated nucleus pulposus), lumbar    L4 with radiculopathy  . Hypertension   . Lung cancer ([place]) 05/18/2016  . Malignant neoplasm of lower lobe of left lung ([place]) 04/05/2016  . Mass of left lung   . Mitral insufficiency 04/06/2016   This patient will eventually need MV repair if his prognosis is good from oncology standpoint. However, his lung cancer therapy  is the priority right now. His cardiac condition won't preclude possible lung surgery.  Once his lung cancer is under control he will need a TEE to further evaluate his mitral valve anatomy and MR severity, and also have ischemic workup as tere is evidence on calcificati  . Renal insufficiency 10/09/2016  . S/P partial lobectomy of lung 05/11/2016  . Varicose veins of legs    Past Surgical History:  Procedure Laterality Date  . ABDOMINAL AORTIC ENDOVASCULAR STENT GRAFT  N/A 03/12/2016   Procedure: ABDOMINAL AORTIC ENDOVASCULAR STENT GRAFT;  Surgeon: Conrad Springboro, MD;  Location: [place];  Service: Vascular;  Laterality: N/A;  . COLONOSCOPY    . INGUINAL HERNIA REPAIR Left 1975   Left inguinal hernia  . INGUINAL HERNIA REPAIR Right   . LOBECTOMY Left 05/11/2016   Procedure: LEFT LOWER LOBE LOBECTOMY AND LEFT UPPER LOBE  RESECTION AND PLACEMENT OF ON-Q;  Surgeon: [name], MD;  Location: [facility];  Service: Thoracic;  Laterality: Left;  . LUMBAR LAMINECTOMY  October 22, 2012  . LYMPH NODE DISSECTION Left 05/11/2016   Procedure: LYMPH NODE DISSECTION;  Surgeon: [name], MD;  Location: [place];  Service: Thoracic;  Laterality: Left;  . STAPLING OF BLEBS Left 05/11/2016   Procedure: STAPLING OF APICAL BLEB;  Surgeon: [name], MD;  Location: [place];  Service: Thoracic;  Laterality: Left;  Marland Kitchen VIDEO ASSISTED THORACOSCOPY Left 05/18/2016   Procedure: VIDEO ASSISTED THORACOSCOPY WITH REMOVAL OF LEFT APICAL BLEB;  Surgeon: [name], MD;  Location: [place];  Service: Thoracic;  Laterality: Left;  Marland Kitchen VIDEO ASSISTED THORACOSCOPY (VATS)/WEDGE RESECTION Left 05/11/2016   Procedure: LEFT VIDEO ASSISTED THORACOSCOPY (VATS);  Surgeon: [name], MD;  Location: [place];  Service: Thoracic;  Laterality: Left;  Marland Kitchen VIDEO BRONCHOSCOPY N/A 05/11/2016   Procedure: VIDEO BRONCHOSCOPY, LEFT LUNG;  Surgeon: [name], MD;  Location: [place];  Service: Thoracic;  Laterality: N/A;  . VIDEO BRONCHOSCOPY N/A 05/18/2016   Procedure: VIDEO  BRONCHOSCOPY WITH BRONCHIAL WASHING;  Surgeon: [name], MD;  Location[facility] OR;  Service: Thoracic;  Laterality: N/A;       Allergies: Tramadol  Medications: Prior to Admission medications   Medication Sig Start Date End Date Taking? Authorizing Provider  carvedilol (COREG) 6.25 MG tablet Take 1 tablet (6.25 mg total) by mouth 2 (two) times daily. 08/06/19   Dorothy Spark, MD  folic acid (FOLVITE) 1 MG tablet  TAKE 1 TABLET BY MOUTH EVERY DAY 08/09/19   Curt Bears, MD  lidocaine-prilocaine (EMLA) cream Apply 1 application topically as needed. Beginning may June 1st   apply a small amount of emla cream  to a cotton ball and apply over port site 1-2  Hours prior to chemotherapy . Do not rub in cream . Cover with plastic wrap. 09/01/19   Curt Bears, MD  lisinopril (ZESTRIL) 2.5 MG tablet TAKE 1 TABLET BY MOUTH EVERY DAY 09/01/19   Dorothy Spark, MD  folic acid (FOLVITE) 1 MG tablet TAKE 1 TABLET BY MOUTH EVERY DAY 01/19/19   Curt Bears, MD     Vital Signs:pend   Physical Exam awake, alert.  Chest clear to auscultation bilaterally with some slightly diminished upper lobe sounds.  Heart with normal rate, irregular rhythm, positive murmur.  Abdomen soft, positive bowel sounds, nontender.  No lower extremity edema.  Imaging: No results found.  Labs:  CBC: Recent Labs    05/28/19 1204 07/09/19 0953 07/30/19 1130 08/20/19 1120  WBC 6.3 7.8 7.4 5.6  HGB 14.2 13.2 12.8* 13.4  HCT 42.9 40.4 39.5 41.3  PLT 403* 318 488* 513*    COAGS: No results for input(s): INR, APTT in the last 8760 hours.  BMP: Recent Labs    05/28/19 1204 07/09/19 0953 07/30/19 1130 08/20/19 1120  NA 133* 136 135 131*  K 3.5 4.1 3.8 4.1  CL 100 104 105 105  CO2 23 20* 20* 21*  GLUCOSE 96 118* 125* 100*  BUN 10 8 9 8   CALCIUM 8.7* 8.6* 8.6* 8.8*  CREATININE 1.47* 1.10 1.16 1.13  GFRNONAA 49* >60 >60 >60  GFRAA 57* >60 >60 >60    LIVER FUNCTION TESTS: Recent Labs    05/28/19 1204 07/09/19 0953 07/30/19 1130 08/20/19 1120  BILITOT 0.7 0.5 0.4 0.3  AST 41 34 28 35  ALT 77* 18 14 18   ALKPHOS 91 85 95 101  PROT 7.3 6.7 6.8 7.0  ALBUMIN 3.4* 3.0* 3.1* 3.1*    Assessment and Plan: Pt with history of metastatic adenocarcinoma of the left lung, initially diagnosed in December 2017 with disease recurrence in July 2019.  He has undergone prior left lower lobectomy as well as wedge  resection of the left upper lobe in 2018.  He has also received chemoradiation in past and is currently on chemotherapy.  He has poor venous access and presents today for Port-A-Cath placement. Risks and benefits of image guided port-a-catheter placement was discussed with the patient including, but not limited to bleeding, infection, pneumothorax, or fibrin sheath development and need for additional procedures.  All of the patient's questions were answered, patient is agreeable to proceed. Consent signed and in chart.     Electronically Signed: D. Rowe Robert, PA-C 09/04/2019, 10:00 AM   I spent a total of 25 minutes at the the patient's bedside AND on the patient's hospital floor or unit, greater than 50% of which was counseling/coordinating care for Port-A-Cath placement

## 2019-09-07 NOTE — Addendum Note (Signed)
Addended by: Rose Phi on: 09/07/2019 05:04 PM   Modules accepted: Orders

## 2019-09-09 NOTE — Progress Notes (Signed)
[facility] OFFICE PROGRESS NOTE  [name], [address] [place] [address]  DIAGNOSIS: Metastatic non-small cell lung cancer, adenocarcinoma initially diagnosed as stage IB (T2a, N0, M0) non-small cell lung cancer, adenocarcinoma diagnosed in December 2017. The patient has evidence for disease recurrence in July 2019.  Biomarker Findings Tumor Mutational Burden - TMB-Intermediate (16 Muts/Mb) Microsatellite status - MS-Stable Genomic Findings For a complete list of the genes assayed, please refer to the Appendix. KIT amplification KRAS P92T SMAD4 splice site 2446-2M>M NO17 I232F 7 Disease relevant genes with no reportable alterations: EGFR, ALK, BRAF, MET, RET, ERBB2, ROS1   PDL 1 expression is 0%  PRIOR THERAPY:  1) status post left lower lobectomy as well as wedge resection of the left upper lobe on 05/11/2016. 2) Adjuvant systemic chemotherapy with cisplatin 75 MG/M2 and Alimta 500 MG/M2 every 3 weeks. First dose 07/03/2016. Status post 4 cycles.  CURRENT THERAPY: Systemic chemotherapy with carboplatin for AUC of 5, Alimta 500 mg/M2 and Keytruda 200 mg IV every 3 weeks. Status post 29cycles. Starting from cycle #5 the patient will be treated with maintenance Alimta and Ketruda (pembrolizumab) every 3 weeks.   INTERVAL HISTORY: [name]. 64 y.o. male returns to the clinic for a follow up visit. The patient is feeling well today without any concerning complaints. The patient continues to tolerate treatment with Alimta and Keytruda well without any adverse effects. Denies any fever, chills, night sweats, or weight loss. Denies any chest pain, cough, shortness of breath, or hemoptysis. Denies any nausea, vomiting, diarrhea, or constipation. Denies any headache or visual changes. Denies any rashes or skin changes. He has mitral valve regurgitation and is followed by cardiology for this concern and may have it operated on in the near future.  The  patient is here today for evaluation prior to starting cycle # 30  MEDICAL HISTORY: Past Medical History:  Diagnosis Date  . AAA (abdominal aortic aneurysm) ([place])   . AAA (abdominal aortic aneurysm) without rupture ([place]) 02/04/2014  . Cancer ([place])    lung  . Encounter for antineoplastic chemotherapy 06/06/2016  . History of hepatitis B   . HNP (herniated nucleus pulposus), lumbar    L4 with radiculopathy  . Hypertension   . Lung cancer ([place]) 05/18/2016  . Malignant neoplasm of lower lobe of left lung ([place]) 04/05/2016  . Mass of left lung   . Mitral insufficiency 04/06/2016   This patient will eventually need MV repair if his prognosis is good from oncology standpoint. However, his lung cancer therapy  is the priority right now. His cardiac condition won't preclude possible lung surgery.  Once his lung cancer is under control he will need a TEE to further evaluate his mitral valve anatomy and MR severity, and also have ischemic workup as tere is evidence on calcificati  . Renal insufficiency 10/09/2016  . S/P partial lobectomy of lung 05/11/2016  . Varicose veins of legs     ALLERGIES:  is allergic to tramadol.  MEDICATIONS:  Current Outpatient Medications  Medication Sig Dispense Refill  . carvedilol (COREG) 6.25 MG tablet Take 1 tablet (6.25 mg total) by mouth 2 (two) times daily. 711 tablet 3  . folic acid (FOLVITE) 1 MG tablet TAKE 1 TABLET BY MOUTH EVERY DAY 90 tablet 1  . lidocaine-prilocaine (EMLA) cream Apply 1 application topically as needed. Beginning may June 1st   apply a small amount of emla cream  to a cotton ball and  apply over port site 1-2  Hours prior to chemotherapy . Do not rub in cream . Cover with plastic wrap. 30 g 0  . lisinopril (ZESTRIL) 2.5 MG tablet TAKE 1 TABLET BY MOUTH EVERY DAY 90 tablet 1   No current facility-administered medications for this visit.   Facility-Administered Medications Ordered in Other Visits  Medication Dose Route Frequency Provider  Last Rate Last Admin  . cyanocobalamin ((VITAMIN B-12)) injection 1,000 mcg  1,000 mcg Intramuscular Once Curt Bears, MD      . heparin lock flush 100 unit/mL  500 Units Intracatheter Once PRN Curt Bears, MD      . pembrolizumab [facility]) 200 mg in sodium chloride 0.9 % 50 mL chemo infusion  200 mg Intravenous Once Curt Bears, MD      . PEMEtrexed (ALIMTA) 1,000 mg in sodium chloride 0.9 % 100 mL chemo infusion  500 mg/m2 (Treatment Plan Recorded) Intravenous Once Curt Bears, MD      . sodium chloride flush (NS) 0.9 % injection 10 mL  10 mL Intracatheter PRN Curt Bears, MD        SURGICAL HISTORY:  Past Surgical History:  Procedure Laterality Date  . ABDOMINAL AORTIC ENDOVASCULAR STENT GRAFT N/A 03/12/2016   Procedure: ABDOMINAL AORTIC ENDOVASCULAR STENT GRAFT;  Surgeon: Conrad Maurertown, MD;  Location: [facility];  Service: Vascular;  Laterality: N/A;  . COLONOSCOPY    . INGUINAL HERNIA REPAIR Left 1975   Left inguinal hernia  . INGUINAL HERNIA REPAIR Right   . IR IMAGING GUIDED PORT INSERTION  09/04/2019  . LOBECTOMY Left 05/11/2016   Procedure: LEFT LOWER LOBE LOBECTOMY AND LEFT UPPER LOBE  RESECTION AND PLACEMENT OF ON-Q;  Surgeon: [name], MD;  Location: [facility];  Service: Thoracic;  Laterality: Left;  . LUMBAR LAMINECTOMY  October 22, 2012  . LYMPH NODE DISSECTION Left 05/11/2016   Procedure: LYMPH NODE DISSECTION;  Surgeon: [name], MD;  Location: [facility];  Service: Thoracic;  Laterality: Left;  . STAPLING OF BLEBS Left 05/11/2016   Procedure: STAPLING OF APICAL BLEB;  Surgeon: [name], MD;  Location: [facility];  Service: Thoracic;  Laterality: Left;  Marland Kitchen VIDEO ASSISTED THORACOSCOPY Left 05/18/2016   Procedure: VIDEO ASSISTED THORACOSCOPY WITH REMOVAL OF LEFT APICAL BLEB;  Surgeon: [name], MD;  Location: [facility];  Service: Thoracic;  Laterality: Left;  Marland Kitchen VIDEO ASSISTED THORACOSCOPY (VATS)/WEDGE RESECTION Left 05/11/2016   Procedure: LEFT  VIDEO ASSISTED THORACOSCOPY (VATS);  Surgeon: [name], MD;  Location: [facility];  Service: Thoracic;  Laterality: Left;  Marland Kitchen VIDEO BRONCHOSCOPY N/A 05/11/2016   Procedure: VIDEO BRONCHOSCOPY, LEFT LUNG;  Surgeon: [name], MD;  Location: [facility];  Service: Thoracic;  Laterality: N/A;  . VIDEO BRONCHOSCOPY N/A 05/18/2016   Procedure: VIDEO BRONCHOSCOPY WITH BRONCHIAL WASHING;  Surgeon: [name], MD;  Location: [facility];  Service: Thoracic;  Laterality: N/A;    REVIEW OF SYSTEMS:   Review of Systems  Constitutional: Negative for appetite change, chills, fatigue, fever and unexpected weight change.  HENT: Negative for mouth sores, nosebleeds, sore throat and trouble swallowing.   Eyes: Negative for eye problems and icterus.  Respiratory: Negative for cough, hemoptysis, shortness of breath and wheezing.   Cardiovascular: Negative for chest pain and leg swelling.  Gastrointestinal: Negative for abdominal pain, constipation, diarrhea, nausea and vomiting.  Genitourinary: Negative for bladder incontinence, difficulty urinating, dysuria, frequency and hematuria.   Musculoskeletal: Negative for back pain, gait problem, neck  pain and neck stiffness.  Skin: Negative for itching and rash.  Neurological: Negative for dizziness, extremity weakness, gait problem, headaches, light-headedness and seizures.  Hematological: Negative for adenopathy. Does not bruise/bleed easily.  Psychiatric/Behavioral: Negative for confusion, depression and sleep disturbance. The patient is not nervous/anxious.     PHYSICAL EXAMINATION:  Blood pressure (!) 141/82, pulse 75, temperature 98.6 F (37 C), temperature source Temporal, resp. rate 18, height _0  (1.905 m), weight 161 lb 9.6 oz (73.3 kg), SpO2 98 %.  ECOG PERFORMANCE STATUS: 1 - Symptomatic but completely ambulatory  Physical Exam  Constitutional: Oriented to person, place, and time and well-developed, well-nourished, and in no distress.  HENT:   Head: Normocephalic and atraumatic.  Mouth/Throat: Oropharynx is clear and moist. No oropharyngeal exudate.  Eyes: Conjunctivae are normal. Right eye exhibits no discharge. Left eye exhibits no discharge. No scleral icterus.  Neck: Normal range of motion. Neck supple.  Cardiovascular: Systolic murmur auscultated in the apex as well as in his posterior left thorax. Normal rate, regular rhythm, and intact distal pulses.   Pulmonary/Chest: Effort normal and breath sounds normal. No respiratory distress. No wheezes. No rales.  Abdominal: Soft. Bowel sounds are normal. Exhibits no distension and no mass. There is no tenderness.  Musculoskeletal: Normal range of motion. Exhibits no edema.  Lymphadenopathy:    No cervical adenopathy.  Neurological: Alert and oriented to person, place, and time. Exhibits normal muscle tone. Gait normal. Coordination normal.  Skin: Skin is warm and dry. No rash noted. Not diaphoretic. No erythema. No pallor.  Psychiatric: Mood, memory and judgment normal.  Vitals reviewed.  LABORATORY DATA: Lab Results  Component Value Date   WBC 5.6 09/10/2019   HGB 12.7 (L) 09/10/2019   HCT 38.7 (L) 09/10/2019   MCV 99.2 09/10/2019   PLT 469 (H) 09/10/2019      Chemistry      Component Value Date/Time   NA 136 09/10/2019 0839   NA 138 01/11/2017 1343   K 4.3 09/10/2019 0839   K 4.7 01/11/2017 1343   CL 106 09/10/2019 0839   CO2 22 09/10/2019 0839   CO2 23 01/11/2017 1343   BUN 11 09/10/2019 0839   BUN 20.2 01/11/2017 1343   CREATININE 1.40 (H) 09/10/2019 0839   CREATININE 1.6 (H) 01/11/2017 1343      Component Value Date/Time   CALCIUM 8.6 (L) 09/10/2019 0839   CALCIUM 9.3 01/11/2017 1343   ALKPHOS 93 09/10/2019 0839   ALKPHOS 89 01/11/2017 1343   AST 26 09/10/2019 0839   AST 41 (H) 01/11/2017 1343   ALT 11 09/10/2019 0839   ALT 27 01/11/2017 1343   BILITOT 0.3 09/10/2019 0839   BILITOT 0.49 01/11/2017 1343       RADIOGRAPHIC  STUDIES:  ECHOCARDIOGRAM COMPLETE  Result Date: 08/18/2019    ECHOCARDIOGRAM REPORT   Patient Name:   [name]. Date of Exam: 08/18/2019 Medical Rec #:  [id number]         Height:       75.5 in Accession #:    [id number]        Weight:       151.6 lb Date of Birth:  1955/05/11         BSA:          1.960 m Patient Age:    38 years          BP:           118/68 mmHg Patient Gender:  M                 HR:           74 bpm. Exam Location:  [address] Procedure: 2D Echo, 3D Echo, Cardiac Doppler, Color Doppler and Strain Analysis Indications:    I34 Mitral regurgitation  History:        Patient has prior history of Echocardiogram examinations, most                 recent 06/25/2018. Risk Factors:Hypertension and Former Smoker.                 AAA. Lung cancer.  Sonographer:    Jessee Avers, RDCS Referring Phys: [id number] [place]  1. Left ventricular ejection fraction, by estimation, is 60 to 65%. The left ventricle has normal function. The left ventricle has no regional wall motion abnormalities. There is mild left ventricular hypertrophy. Left ventricular diastolic parameters are consistent with Grade I diastolic dysfunction (impaired relaxation).  2. Right ventricular systolic function is normal. The right ventricular size is normal. There is severely elevated pulmonary artery systolic pressure. The estimated right ventricular systolic pressure is 47.0 mmHg.  3. Left atrial size was moderately dilated.  4. The mitral valve is myxomatous. Severe mitral valve regurgitation.  5. The aortic valve is tricuspid. Aortic valve regurgitation is not visualized.  6. Aortic dilatation noted. Aneurysm of the ascending aorta, measuring 40 mm. 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  7. The inferior vena cava is normal in size with <50% respiratory variability, suggesting right atrial pressure of 8 mmHg. Comparison(s): 06/25/18 EF 60-65%. Severe MR.  RVSP 29 mmHg. Sinus of Valsava measured 3.9 cm on the last study, but now measures 4.9 cm. This previously measured 4.8 cm in 2019. The ascending aorta is stable. FINDINGS  Left Ventricle: Left ventricular ejection fraction, by estimation, is 60 to 65%. The left ventricle has normal function. The left ventricle has no regional wall motion abnormalities. The left ventricular internal cavity size was normal in size. There is  mild left ventricular hypertrophy. Left ventricular diastolic parameters are consistent with Grade I diastolic dysfunction (impaired relaxation). Indeterminate filling pressures. Right Ventricle: The right ventricular size is normal. No increase in right ventricular wall thickness. Right ventricular systolic function is normal. There is severely elevated pulmonary artery systolic pressure. The tricuspid regurgitant velocity is 3.61 m/s, and with an assumed right atrial pressure of 8 mmHg, the estimated right ventricular systolic pressure is 96.2 mmHg. Left Atrium: Left atrial size was moderately dilated. Right Atrium: Right atrial size was normal in size. Pericardium: There is no evidence of pericardial effusion. 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 There is severe thickening of the anterior mitral valve leaflet(s). Severe mitral valve regurgitation, with eccentric posteriorly directed jet. Tricuspid Valve: The tricuspid valve is grossly normal. Tricuspid valve regurgitation is mild. Aortic Valve: The aortic valve is tricuspid. Aortic valve regurgitation is not visualized. Pulmonic Valve: The pulmonic valve was normal in structure. Pulmonic valve regurgitation is not visualized. Aorta: Aortic dilatation noted. 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 There is an aneurysm involving the ascending aorta. The aneurysm measures 40 mm.  Venous: The inferior vena cava is normal in size with less than 50% respiratory  variability, suggesting right atrial pressure of 8 mmHg. IAS/Shunts: No atrial level shunt detected by color flow Doppler.  LEFT VENTRICLE PLAX 2D LVIDd:         5.20 cm  Diastology LVIDs:         3.50 cm  LV e' lateral:   7.51 cm/s LV PW:         1.10 cm  LV E/e' lateral: 12.4 LV IVS:        1.20 cm  LV e' medial:    6.53 cm/s LVOT diam:     2.30 cm  LV E/e' medial:  14.3 LV SV:         52 LV SV Index:   26       2D Longitudinal Strain LVOT Area:     4.15 cm 2D Strain GLS (A2C):   -31.1 %                         2D Strain GLS (A3C):   -22.1 %                         2D Strain GLS (A4C):   -21.1 %                         2D Strain GLS Avg:     -24.8 %                          3D Volume EF:                         3D EF:        60 %                         LV EDV:       217 ml                         LV ESV:       87 ml                         LV SV:        130 ml RIGHT VENTRICLE RV Basal diam:  2.90 cm RV S prime:     12.70 cm/s TAPSE (M-mode): 2.5 cm RVSP:           60.1 mmHg LEFT ATRIUM             Index       RIGHT ATRIUM           Index LA diam:        4.40 cm 2.24 cm/m  RA Pressure: 8.00 mmHg LA Vol (A2C):   82.8 ml 42.24 ml/m RA Area:     10.10 cm LA Vol (A4C):   68.0 ml 34.69 ml/m RA Volume:   20.60 ml  10.51 ml/m LA Biplane Vol: 81.1 ml 41.37 ml/m  AORTIC VALVE LVOT Vmax:   75.00 cm/s LVOT Vmean:  51.250 cm/s LVOT VTI:    0.124 m  AORTA Ao Root diam: 4.90 cm Ao Asc diam:  4.00 cm MITRAL VALVE                 TRICUSPID VALVE  TR Peak grad:   52.1 mmHg                              TR Vmax:        361.00 cm/s MR Peak grad:    173.7 mmHg  Estimated RAP:  8.00 mmHg MR Mean grad:    94.0 mmHg   RVSP:           60.1 mmHg MR Vmax:         659.00 cm/s MR Vmean:        439.0 cm/s  SHUNTS MR PISA:         5.09 cm    Systemic VTI:  0.12 m MR PISA Eff ROA: 30 mm      Systemic Diam: 2.30 cm MR PISA Radius:   0.90 cm MV E velocity: 93.10 cm/s MV A velocity: 103.00 cm/s MV E/A ratio:  0.90 Lyman Bishop MD Electronically signed by Lyman Bishop MD Signature Date/Time: 08/18/2019/9:55:44 PM    Final    IR IMAGING GUIDED PORT INSERTION  Result Date: 09/04/2019 INDICATION: History of lung cancer. In need of durable intravenous access for chemotherapy administration. EXAM: IMPLANTED PORT A CATH PLACEMENT WITH ULTRASOUND AND FLUOROSCOPIC GUIDANCE COMPARISON:  Chest CT-07/28/2019 MEDICATIONS: Ancef 2 gm IV; The antibiotic was administered within an appropriate time interval prior to skin puncture. ANESTHESIA/SEDATION: Moderate (conscious) sedation was employed during this procedure. A total of Versed 2 mg and Fentanyl 100 mcg was administered intravenously. Moderate Sedation Time: 24 minutes. The patient's level of consciousness and vital signs were monitored continuously by radiology nursing throughout the procedure under my direct supervision. CONTRAST:  None FLUOROSCOPY TIME:  42 seconds (4 mGy) COMPLICATIONS: None immediate. PROCEDURE: The procedure, risks, benefits, and alternatives were explained to the patient. Questions regarding the procedure were encouraged and answered. The patient understands and consents to the procedure. The right neck and chest were prepped with chlorhexidine in a sterile fashion, and a sterile drape was applied covering the operative field. Maximum barrier sterile technique with sterile gowns and gloves were used for the procedure. A timeout was performed prior to the initiation of the procedure. Local anesthesia was provided with 1% lidocaine with epinephrine. After creating a small venotomy incision, a micropuncture kit was utilized to access the internal jugular vein. Real-time ultrasound guidance was utilized for vascular access including the acquisition of a permanent ultrasound image documenting patency of the accessed vessel. The microwire was utilized to measure appropriate catheter  length. A subcutaneous port pocket was then created along the upper chest wall utilizing a combination of sharp and blunt dissection. The pocket was irrigated with sterile saline. A single lumen "Slim" sized power injectable port was chosen for placement. The 8 Fr catheter was tunneled from the port pocket site to the venotomy incision. The port was placed in the pocket. The external catheter was trimmed to appropriate length. At the venotomy, an 8 Fr peel-away sheath was placed over a guidewire under fluoroscopic guidance. The catheter was then placed through the sheath and the sheath was removed. Final catheter positioning was confirmed and documented with a fluoroscopic spot radiograph. The port was accessed with a Huber needle, aspirated and flushed with heparinized saline. The venotomy site was closed with an interrupted 4-0 Vicryl suture. The port pocket incision was closed with interrupted 2-0 Vicryl suture. Dermabond and Steri-strips were applied to both incisions. Dressings were applied. The patient tolerated the procedure well  without immediate post procedural complication. FINDINGS: After catheter placement, the tip lies within the superior cavoatrial junction. The catheter aspirates and flushes normally and is ready for immediate use. IMPRESSION: Successful placement of a right internal jugular approach power injectable Port-A-Cath. The catheter is ready for immediate use. Electronically Signed   By: Sandi Mariscal M.D.   On: 09/04/2019 13:15     ASSESSMENT/PLAN:  This is a very pleasant 64 year old African American male with metastatic disease who was initially diagnosed with stage IB non-small cell lung cancer, adenocarcinoma of the left upper lobe. Molecular studies showed no actionable mutations. PD-L1 expression was negative. First diagnosed December 2017.  He was status post a wedge resection of the left upper lobe followed by 4 cycles of adjuvant systemic chemotherapy with cisplatin and  Alimta. He tolerated treatment well except for fatigue.  He had been on observationuntilJune of 2018.  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   The is currently undergoing systemic chemotherapy with carboplatin, alimta, and keytruda. He is status post 29 cycles. Starting from cycle #5, he has been on maintenance Alimta and Keytruda every 3 weeks.    Labs were reviewed. Recommend that he proceed with cycle #30 today as scheduled. His creatinine is a little elevated today at 1.40. He will receive an additional 500 ccs and was encouraged to hydrate at home.   We will see him back for a follow up visit in 3 weeks for evaluation before starting cycle #31.  He will continue to follow with cardiology regarding his mitral valve regurgitation.    The patient was advised to call immediately if he has any concerning symptoms in the interval. The patient voices understanding of current disease status and treatment options and is in agreement with the current care plan. All questions were answered. The patient knows to call the clinic with any problems, questions or concerns. We can certainly see the patient much sooner if necessary   No orders of the defined types were placed in this encounter.    Kylen Schliep L Breda Bond, PA-C 09/10/19

## 2019-09-10 ENCOUNTER — Other Ambulatory Visit: Payer: Self-pay

## 2019-09-10 ENCOUNTER — Inpatient Hospital Stay: Payer: No Typology Code available for payment source | Attending: Internal Medicine | Admitting: Physician Assistant

## 2019-09-10 ENCOUNTER — Inpatient Hospital Stay: Payer: No Typology Code available for payment source

## 2019-09-10 VITALS — BP 141/82 | HR 75 | Temp 98.6°F | Resp 18 | Ht 75.0 in | Wt 161.6 lb

## 2019-09-10 DIAGNOSIS — N289 Disorder of kidney and ureter, unspecified: Secondary | ICD-10-CM

## 2019-09-10 DIAGNOSIS — C3412 Malignant neoplasm of upper lobe, left bronchus or lung: Secondary | ICD-10-CM | POA: Diagnosis present

## 2019-09-10 DIAGNOSIS — Z5112 Encounter for antineoplastic immunotherapy: Secondary | ICD-10-CM | POA: Diagnosis not present

## 2019-09-10 DIAGNOSIS — C3492 Malignant neoplasm of unspecified part of left bronchus or lung: Secondary | ICD-10-CM | POA: Diagnosis not present

## 2019-09-10 DIAGNOSIS — Z5111 Encounter for antineoplastic chemotherapy: Secondary | ICD-10-CM | POA: Diagnosis present

## 2019-09-10 DIAGNOSIS — Z79899 Other long term (current) drug therapy: Secondary | ICD-10-CM | POA: Insufficient documentation

## 2019-09-10 DIAGNOSIS — C3432 Malignant neoplasm of lower lobe, left bronchus or lung: Secondary | ICD-10-CM

## 2019-09-10 LAB — CMP (CANCER CENTER ONLY)
ALT: 11 U/L (ref 0–44)
AST: 26 U/L (ref 15–41)
Albumin: 3 g/dL — ABNORMAL LOW (ref 3.5–5.0)
Alkaline Phosphatase: 93 U/L (ref 38–126)
Anion gap: 8 (ref 5–15)
BUN: 11 mg/dL (ref 8–23)
CO2: 22 mmol/L (ref 22–32)
Calcium: 8.6 mg/dL — ABNORMAL LOW (ref 8.9–10.3)
Chloride: 106 mmol/L (ref 98–111)
Creatinine: 1.4 mg/dL — ABNORMAL HIGH (ref 0.61–1.24)
GFR, Est AFR Am: >60 mL/min (ref >60)
GFR, Estimated: 52 mL/min — ABNORMAL LOW (ref >60)
Glucose, Bld: 81 mg/dL (ref 70–99)
Potassium: 4.3 mmol/L (ref 3.5–5.1)
Sodium: 136 mmol/L (ref 135–145)
Total Bilirubin: 0.3 mg/dL (ref 0.3–1.2)
Total Protein: 6.7 g/dL (ref 6.5–8.1)

## 2019-09-10 LAB — CBC WITH DIFFERENTIAL (CANCER CENTER ONLY)
Abs Immature Granulocytes: 0.03 10*3/uL (ref 0.00–0.07)
Basophils Absolute: 0 10*3/uL (ref 0.0–0.1)
Basophils Relative: 1 %
Eosinophils Absolute: 0.2 10*3/uL (ref 0.0–0.5)
Eosinophils Relative: 4 %
HCT: 38.7 % — ABNORMAL LOW (ref 39.0–52.0)
Hemoglobin: 12.7 g/dL — ABNORMAL LOW (ref 13.0–17.0)
Immature Granulocytes: 1 %
Lymphocytes Relative: 17 %
Lymphs Abs: 1 10*3/uL (ref 0.7–4.0)
MCH: 32.6 pg (ref 26.0–34.0)
MCHC: 32.8 g/dL (ref 30.0–36.0)
MCV: 99.2 fL (ref 80.0–100.0)
Monocytes Absolute: 1.2 10*3/uL — ABNORMAL HIGH (ref 0.1–1.0)
Monocytes Relative: 22 %
Neutro Abs: 3.1 10*3/uL (ref 1.7–7.7)
Neutrophils Relative %: 55 %
Platelet Count: 469 10*3/uL — ABNORMAL HIGH (ref 150–400)
RBC: 3.9 MIL/uL — ABNORMAL LOW (ref 4.22–5.81)
RDW: 15.8 % — ABNORMAL HIGH (ref 11.5–15.5)
WBC Count: 5.6 10*3/uL (ref 4.0–10.5)
nRBC: 0 % (ref 0.0–0.2)

## 2019-09-10 LAB — TSH: TSH: 2.093 u[IU]/mL (ref 0.320–4.118)

## 2019-09-10 MED ORDER — SODIUM CHLORIDE 0.9 % IV SOLN
200.0000 mg | Freq: Once | INTRAVENOUS | Status: AC
  Administered 2019-09-10: 200 mg via INTRAVENOUS
  Filled 2019-09-10: qty 8

## 2019-09-10 MED ORDER — ONDANSETRON HCL 4 MG/2ML IJ SOLN
8.0000 mg | Freq: Once | INTRAMUSCULAR | Status: AC
  Administered 2019-09-10: 8 mg via INTRAVENOUS

## 2019-09-10 MED ORDER — HEPARIN SOD (PORK) LOCK FLUSH 100 UNIT/ML IV SOLN
500.0000 [IU] | Freq: Once | INTRAVENOUS | Status: AC | PRN
  Administered 2019-09-10: 500 [IU]
  Filled 2019-09-10: qty 5

## 2019-09-10 MED ORDER — CYANOCOBALAMIN 1000 MCG/ML IJ SOLN
1000.0000 ug | Freq: Once | INTRAMUSCULAR | Status: AC
  Administered 2019-09-10: 1000 ug via INTRAMUSCULAR

## 2019-09-10 MED ORDER — SODIUM CHLORIDE 0.9 % IV SOLN
Freq: Once | INTRAVENOUS | Status: AC
  Filled 2019-09-10: qty 250

## 2019-09-10 MED ORDER — SODIUM CHLORIDE 0.9 % IV SOLN
10.0000 mg | Freq: Once | INTRAVENOUS | Status: AC
  Administered 2019-09-10: 10 mg via INTRAVENOUS
  Filled 2019-09-10: qty 10

## 2019-09-10 MED ORDER — SODIUM CHLORIDE 0.9 % IV SOLN
500.0000 mg/m2 | Freq: Once | INTRAVENOUS | Status: AC
  Administered 2019-09-10: 1000 mg via INTRAVENOUS
  Filled 2019-09-10: qty 40

## 2019-09-10 MED ORDER — ONDANSETRON HCL 4 MG/2ML IJ SOLN
INTRAMUSCULAR | Status: AC
  Filled 2019-09-10: qty 4

## 2019-09-10 MED ORDER — SODIUM CHLORIDE 0.9% FLUSH
10.0000 mL | INTRAVENOUS | Status: DC | PRN
  Administered 2019-09-10: 10 mL
  Filled 2019-09-10: qty 10

## 2019-09-10 MED ORDER — CYANOCOBALAMIN 1000 MCG/ML IJ SOLN
INTRAMUSCULAR | Status: AC
  Filled 2019-09-10: qty 1

## 2019-09-10 NOTE — Patient Instructions (Signed)
[facility] Discharge Instructions for Patients Receiving Chemotherapy  Today you received the following chemotherapy agent/immunotherapy :  Pemetrexed, Pembrolizumab.  To help prevent nausea and vomiting after your treatment, we encourage you to take your nausea medication as prescribed.   If you develop nausea and vomiting that is not controlled by your nausea medication, call the clinic.   BELOW ARE SYMPTOMS THAT SHOULD BE REPORTED IMMEDIATELY:  *FEVER GREATER THAN 100.5 F  *CHILLS WITH OR WITHOUT FEVER  NAUSEA AND VOMITING THAT IS NOT CONTROLLED WITH YOUR NAUSEA MEDICATION  *UNUSUAL SHORTNESS OF BREATH  *UNUSUAL BRUISING OR BLEEDING  TENDERNESS IN MOUTH AND THROAT WITH OR WITHOUT PRESENCE OF ULCERS  *URINARY PROBLEMS  *BOWEL PROBLEMS  UNUSUAL RASH Items with * indicate a potential emergency and should be followed up as soon as possible.  Feel free to call the clinic should you have any questions or concerns. The clinic phone number is [phone].  Please show the [facility] at check-in to the Emergency Department and triage nurse.

## 2019-09-11 ENCOUNTER — Telehealth: Payer: Self-pay | Admitting: Physician Assistant

## 2019-09-11 NOTE — Telephone Encounter (Signed)
Scheduled appts per 5/20 los. Pt confirmed appt date and time.

## 2019-10-01 ENCOUNTER — Inpatient Hospital Stay: Payer: No Typology Code available for payment source

## 2019-10-01 ENCOUNTER — Other Ambulatory Visit: Payer: No Typology Code available for payment source

## 2019-10-01 ENCOUNTER — Inpatient Hospital Stay: Payer: No Typology Code available for payment source | Attending: Internal Medicine | Admitting: Internal Medicine

## 2019-10-01 ENCOUNTER — Other Ambulatory Visit: Payer: Self-pay

## 2019-10-01 ENCOUNTER — Encounter: Payer: Self-pay | Admitting: Internal Medicine

## 2019-10-01 VITALS — BP 134/76 | HR 81 | Temp 97.2°F | Resp 18 | Ht 75.0 in | Wt 158.4 lb

## 2019-10-01 DIAGNOSIS — C3490 Malignant neoplasm of unspecified part of unspecified bronchus or lung: Secondary | ICD-10-CM

## 2019-10-01 DIAGNOSIS — C3432 Malignant neoplasm of lower lobe, left bronchus or lung: Secondary | ICD-10-CM

## 2019-10-01 DIAGNOSIS — C3412 Malignant neoplasm of upper lobe, left bronchus or lung: Secondary | ICD-10-CM | POA: Diagnosis present

## 2019-10-01 DIAGNOSIS — C3492 Malignant neoplasm of unspecified part of left bronchus or lung: Secondary | ICD-10-CM

## 2019-10-01 DIAGNOSIS — Z5112 Encounter for antineoplastic immunotherapy: Secondary | ICD-10-CM | POA: Diagnosis not present

## 2019-10-01 DIAGNOSIS — Z5111 Encounter for antineoplastic chemotherapy: Secondary | ICD-10-CM

## 2019-10-01 DIAGNOSIS — Z95828 Presence of other vascular implants and grafts: Secondary | ICD-10-CM

## 2019-10-01 DIAGNOSIS — Z79899 Other long term (current) drug therapy: Secondary | ICD-10-CM | POA: Insufficient documentation

## 2019-10-01 LAB — CMP (CANCER CENTER ONLY)
ALT: 13 U/L (ref 0–44)
AST: 27 U/L (ref 15–41)
Albumin: 3.1 g/dL — ABNORMAL LOW (ref 3.5–5.0)
Alkaline Phosphatase: 96 U/L (ref 38–126)
Anion gap: 9 (ref 5–15)
BUN: 13 mg/dL (ref 8–23)
CO2: 19 mmol/L — ABNORMAL LOW (ref 22–32)
Calcium: 8.6 mg/dL — ABNORMAL LOW (ref 8.9–10.3)
Chloride: 106 mmol/L (ref 98–111)
Creatinine: 1.16 mg/dL (ref 0.61–1.24)
GFR, Est AFR Am: >60 mL/min (ref >60)
GFR, Estimated: >60 mL/min (ref >60)
Glucose, Bld: 98 mg/dL (ref 70–99)
Potassium: 4.2 mmol/L (ref 3.5–5.1)
Sodium: 134 mmol/L — ABNORMAL LOW (ref 135–145)
Total Bilirubin: 0.4 mg/dL (ref 0.3–1.2)
Total Protein: 6.6 g/dL (ref 6.5–8.1)

## 2019-10-01 LAB — CBC WITH DIFFERENTIAL (CANCER CENTER ONLY)
Abs Immature Granulocytes: 0.04 10*3/uL (ref 0.00–0.07)
Basophils Absolute: 0 10*3/uL (ref 0.0–0.1)
Basophils Relative: 1 %
Eosinophils Absolute: 0.3 10*3/uL (ref 0.0–0.5)
Eosinophils Relative: 4 %
HCT: 38 % — ABNORMAL LOW (ref 39.0–52.0)
Hemoglobin: 12.7 g/dL — ABNORMAL LOW (ref 13.0–17.0)
Immature Granulocytes: 1 %
Lymphocytes Relative: 13 %
Lymphs Abs: 0.9 10*3/uL (ref 0.7–4.0)
MCH: 32.6 pg (ref 26.0–34.0)
MCHC: 33.4 g/dL (ref 30.0–36.0)
MCV: 97.4 fL (ref 80.0–100.0)
Monocytes Absolute: 1.3 10*3/uL — ABNORMAL HIGH (ref 0.1–1.0)
Monocytes Relative: 18 %
Neutro Abs: 4.6 10*3/uL (ref 1.7–7.7)
Neutrophils Relative %: 63 %
Platelet Count: 383 10*3/uL (ref 150–400)
RBC: 3.9 MIL/uL — ABNORMAL LOW (ref 4.22–5.81)
RDW: 15.6 % — ABNORMAL HIGH (ref 11.5–15.5)
WBC Count: 7.3 10*3/uL (ref 4.0–10.5)
nRBC: 0 % (ref 0.0–0.2)

## 2019-10-01 LAB — TSH: TSH: 1.775 u[IU]/mL (ref 0.320–4.118)

## 2019-10-01 MED ORDER — SODIUM CHLORIDE 0.9 % IV SOLN
Freq: Once | INTRAVENOUS | Status: DC
  Filled 2019-10-01: qty 250

## 2019-10-01 MED ORDER — SODIUM CHLORIDE 0.9 % IV SOLN
500.0000 mg/m2 | Freq: Once | INTRAVENOUS | Status: AC
  Administered 2019-10-01: 1000 mg via INTRAVENOUS
  Filled 2019-10-01: qty 40

## 2019-10-01 MED ORDER — SODIUM CHLORIDE 0.9 % IV SOLN
10.0000 mg | Freq: Once | INTRAVENOUS | Status: AC
  Administered 2019-10-01: 10 mg via INTRAVENOUS
  Filled 2019-10-01: qty 10

## 2019-10-01 MED ORDER — HEPARIN SOD (PORK) LOCK FLUSH 100 UNIT/ML IV SOLN
500.0000 [IU] | Freq: Once | INTRAVENOUS | Status: AC | PRN
  Administered 2019-10-01: 500 [IU]
  Filled 2019-10-01: qty 5

## 2019-10-01 MED ORDER — ONDANSETRON HCL 4 MG/2ML IJ SOLN
8.0000 mg | Freq: Once | INTRAMUSCULAR | Status: AC
  Administered 2019-10-01: 8 mg via INTRAVENOUS

## 2019-10-01 MED ORDER — SODIUM CHLORIDE 0.9% FLUSH
10.0000 mL | INTRAVENOUS | Status: DC | PRN
  Administered 2019-10-01: 10 mL
  Filled 2019-10-01: qty 10

## 2019-10-01 MED ORDER — SODIUM CHLORIDE 0.9 % IV SOLN
200.0000 mg | Freq: Once | INTRAVENOUS | Status: AC
  Administered 2019-10-01: 200 mg via INTRAVENOUS
  Filled 2019-10-01: qty 8

## 2019-10-01 MED ORDER — SODIUM CHLORIDE 0.9 % IV SOLN
Freq: Once | INTRAVENOUS | Status: AC
  Filled 2019-10-01: qty 250

## 2019-10-01 MED ORDER — SODIUM CHLORIDE 0.9% FLUSH
10.0000 mL | INTRAVENOUS | Status: DC | PRN
  Administered 2019-10-01: 10 mL via INTRAVENOUS
  Filled 2019-10-01: qty 10

## 2019-10-01 MED ORDER — ONDANSETRON HCL 4 MG/2ML IJ SOLN
INTRAMUSCULAR | Status: AC
  Filled 2019-10-01: qty 4

## 2019-10-01 NOTE — H&P (View-Only) (Signed)
       [facility] [phone]   [phone]  OFFICE PROGRESS NOTE  Bland, Veita, MD [address] [place] [address]  DIAGNOSIS: Metastatic non-small cell lung cancer, adenocarcinoma initially diagnosed as stage IB (T2a, N0, M0) non-small cell lung cancer, adenocarcinoma diagnosed in December 2017.  The patient has evidence for disease recurrence in July 2019.  Biomarker Findings Tumor Mutational Burden - TMB-Intermediate (16 Muts/Mb) Microsatellite status - MS-Stable Genomic Findings For a complete list of the genes assayed, please refer to the Appendix. KIT amplification KRAS G12C SMAD4 splice site 1309-1G>T TP53 I232F 7 Disease relevant genes with no reportable alterations: EGFR, ALK, BRAF, MET, RET, ERBB2, ROS1   PDL 1 expression is 0%  PRIOR THERAPY:  1) status post left lower lobectomy as well as wedge resection of the left upper lobe on 05/11/2016. 2) Adjuvant systemic chemotherapy with cisplatin 75 MG/M2 and Alimta 500 MG/M2 every 3 weeks. First dose 07/03/2016. Status post 4 cycles.  CURRENT THERAPY: Systemic chemotherapy with carboplatin for AUC of 5, Alimta 500 mg/M2 and Keytruda 200 mg IV every 3 weeks.  Status post 30 cycles.  Starting from cycle #5 the patient will be treated with maintenance Alimta and Ketruda (pembrolizumab) every 3 weeks.  INTERVAL HISTORY: William K Sirianni [name] 64 y.o. male returns to the clinic today for follow-up visit.  The patient is feeling fine today with no concerning complaints.  He denied having any chest pain, shortness of breath, cough or hemoptysis.  He denied having any fever or chills.  He has no nausea, vomiting, diarrhea or constipation.  He has no recent weight loss or night sweats.  He continues to tolerate his treatment with maintenance Alimta and Keytruda fairly well.  The patient is here today for evaluation before starting cycle #31.  MEDICAL HISTORY: Past Medical History:  Diagnosis  Date  . AAA (abdominal aortic aneurysm) (HCC)   . AAA (abdominal aortic aneurysm) without rupture (HCC) 02/04/2014  . Cancer (HCC)    lung  . Encounter for antineoplastic chemotherapy 06/06/2016  . History of hepatitis B   . HNP (herniated nucleus pulposus), lumbar    L4 with radiculopathy  . Hypertension   . Lung cancer (HCC) 05/18/2016  . Malignant neoplasm of lower lobe of left lung (HCC) 04/05/2016  . Mass of left lung   . Mitral insufficiency 04/06/2016   This patient will eventually need MV repair if his prognosis is good from oncology standpoint. However, his lung cancer therapy  is the priority right now. His cardiac condition won't preclude possible lung surgery.  Once his lung cancer is under control he will need a TEE to further evaluate his mitral valve anatomy and MR severity, and also have ischemic workup as tere is evidence on calcificati  . Renal insufficiency 10/09/2016  . S/P partial lobectomy of lung 05/11/2016  . Varicose veins of legs     ALLERGIES:  is allergic to tramadol.  MEDICATIONS:  Current Outpatient Medications  Medication Sig Dispense Refill  . carvedilol (COREG) 6.25 MG tablet Take 1 tablet (6.25 mg total) by mouth 2 (two) times daily. 180 tablet 3  . folic acid (FOLVITE) 1 MG tablet TAKE 1 TABLET BY MOUTH EVERY DAY (Patient taking differently: Take 1 mg by mouth daily. ) 90 tablet 1  . lidocaine-prilocaine (EMLA) cream Apply 1 application topically as needed. Beginning may June 1st   apply a small amount of emla cream  to a   cotton ball and apply over port site 1-2  Hours prior to chemotherapy . Do not rub in cream . Cover with plastic wrap. (Patient taking differently: Apply 1 application topically as needed (Port). Beginning may June 1st   apply a small amount of emla cream  to a cotton ball and apply over port site 1-2  Hours prior to chemotherapy . Do not rub in cream . Cover with plastic wrap.) 30 g 0  . lisinopril (ZESTRIL) 2.5 MG tablet TAKE 1 TABLET BY  MOUTH EVERY DAY (Patient taking differently: Take 2.5 mg by mouth daily. ) 90 tablet 1   No current facility-administered medications for this visit.   Facility-Administered Medications Ordered in Other Visits  Medication Dose Route Frequency Provider Last Rate Last Admin  . sodium chloride flush (NS) 0.9 % injection 10 mL  10 mL Intravenous PRN Keeshia Sanderlin, MD   10 mL at 10/01/19 0805    SURGICAL HISTORY:  Past Surgical History:  Procedure Laterality Date  . ABDOMINAL AORTIC ENDOVASCULAR STENT GRAFT N/A 03/12/2016   Procedure: ABDOMINAL AORTIC ENDOVASCULAR STENT GRAFT;  Surgeon: Brian L Chen, MD;  Location: [facility] OR;  Service: Vascular;  Laterality: N/A;  . COLONOSCOPY    . INGUINAL HERNIA REPAIR Left 1975   Left inguinal hernia  . INGUINAL HERNIA REPAIR Right   . IR IMAGING GUIDED PORT INSERTION  09/04/2019  . LOBECTOMY Left 05/11/2016   Procedure: LEFT LOWER LOBE LOBECTOMY AND LEFT UPPER LOBE  RESECTION AND PLACEMENT OF ON-Q;  Surgeon: Edward B Gerhardt, MD;  Location: [facility] OR;  Service: Thoracic;  Laterality: Left;  . LUMBAR LAMINECTOMY  October 22, 2012  . LYMPH NODE DISSECTION Left 05/11/2016   Procedure: LYMPH NODE DISSECTION;  Surgeon: Edward B Gerhardt, MD;  Location: [facility] OR;  Service: Thoracic;  Laterality: Left;  . STAPLING OF BLEBS Left 05/11/2016   Procedure: STAPLING OF APICAL BLEB;  Surgeon: Edward B Gerhardt, MD;  Location: [facility] OR;  Service: Thoracic;  Laterality: Left;  . VIDEO ASSISTED THORACOSCOPY Left 05/18/2016   Procedure: VIDEO ASSISTED THORACOSCOPY WITH REMOVAL OF LEFT APICAL BLEB;  Surgeon: Edward B Gerhardt, MD;  Location: [facility] OR;  Service: Thoracic;  Laterality: Left;  . VIDEO ASSISTED THORACOSCOPY (VATS)/WEDGE RESECTION Left 05/11/2016   Procedure: LEFT VIDEO ASSISTED THORACOSCOPY (VATS);  Surgeon: Edward B Gerhardt, MD;  Location: [facility] OR;  Service: Thoracic;  Laterality: Left;  . VIDEO BRONCHOSCOPY N/A 05/11/2016   Procedure: VIDEO BRONCHOSCOPY, LEFT LUNG;  Surgeon:  Edward B Gerhardt, MD;  Location: [facility] OR;  Service: Thoracic;  Laterality: N/A;  . VIDEO BRONCHOSCOPY N/A 05/18/2016   Procedure: VIDEO BRONCHOSCOPY WITH BRONCHIAL WASHING;  Surgeon: Edward B Gerhardt, MD;  Location: [facility] OR;  Service: Thoracic;  Laterality: N/A;    REVIEW OF SYSTEMS:  A comprehensive review of systems was negative.   PHYSICAL EXAMINATION: General appearance: alert, cooperative and no distress Head: Normocephalic, without obvious abnormality, atraumatic Neck: no adenopathy, no JVD, supple, symmetrical, trachea midline and thyroid not enlarged, symmetric, no tenderness/mass/nodules Lymph nodes: Cervical, supraclavicular, and axillary nodes normal. Resp: clear to auscultation bilaterally Back: symmetric, no curvature. ROM normal. No CVA tenderness. Cardio: regular rate and rhythm, S1, S2 normal, no murmur, click, rub or gallop GI: soft, non-tender; bowel sounds normal; no masses,  no organomegaly Extremities: extremities normal, atraumatic, no cyanosis or edema   ECOG PERFORMANCE STATUS: 1 - Symptomatic but completely ambulatory  Blood pressure 134/76, pulse 81, temperature (!) 97.2 F (36.2 C), temperature source Temporal, resp. rate   18, height 6' 3" (1.905 m), weight 158 lb 6.4 oz (71.8 kg), SpO2 98 %.  LABORATORY DATA: Lab Results  Component Value Date   WBC 5.6 09/10/2019   HGB 12.7 (L) 09/10/2019   HCT 38.7 (L) 09/10/2019   MCV 99.2 09/10/2019   PLT 469 (H) 09/10/2019      Chemistry      Component Value Date/Time   NA 136 09/10/2019 0839   NA 138 01/11/2017 1343   K 4.3 09/10/2019 0839   K 4.7 01/11/2017 1343   CL 106 09/10/2019 0839   CO2 22 09/10/2019 0839   CO2 23 01/11/2017 1343   BUN 11 09/10/2019 0839   BUN 20.2 01/11/2017 1343   CREATININE 1.40 (H) 09/10/2019 0839   CREATININE 1.6 (H) 01/11/2017 1343      Component Value Date/Time   CALCIUM 8.6 (L) 09/10/2019 0839   CALCIUM 9.3 01/11/2017 1343   ALKPHOS 93 09/10/2019 0839   ALKPHOS 89  01/11/2017 1343   AST 26 09/10/2019 0839   AST 41 (H) 01/11/2017 1343   ALT 11 09/10/2019 0839   ALT 27 01/11/2017 1343   BILITOT 0.3 09/10/2019 0839   BILITOT 0.49 01/11/2017 1343       RADIOGRAPHIC STUDIES: IR IMAGING GUIDED PORT INSERTION  Result Date: 09/04/2019 INDICATION: History of lung cancer. In need of durable intravenous access for chemotherapy administration. EXAM: IMPLANTED PORT A CATH PLACEMENT WITH ULTRASOUND AND FLUOROSCOPIC GUIDANCE COMPARISON:  Chest CT-07/28/2019 MEDICATIONS: Ancef 2 gm IV; The antibiotic was administered within an appropriate time interval prior to skin puncture. ANESTHESIA/SEDATION: Moderate (conscious) sedation was employed during this procedure. A total of Versed 2 mg and Fentanyl 100 mcg was administered intravenously. Moderate Sedation Time: 24 minutes. The patient's level of consciousness and vital signs were monitored continuously by radiology nursing throughout the procedure under my direct supervision. CONTRAST:  None FLUOROSCOPY TIME:  42 seconds (4 mGy) COMPLICATIONS: None immediate. PROCEDURE: The procedure, risks, benefits, and alternatives were explained to the patient. Questions regarding the procedure were encouraged and answered. The patient understands and consents to the procedure. The right neck and chest were prepped with chlorhexidine in a sterile fashion, and a sterile drape was applied covering the operative field. Maximum barrier sterile technique with sterile gowns and gloves were used for the procedure. A timeout was performed prior to the initiation of the procedure. Local anesthesia was provided with 1% lidocaine with epinephrine. After creating a small venotomy incision, a micropuncture kit was utilized to access the internal jugular vein. Real-time ultrasound guidance was utilized for vascular access including the acquisition of a permanent ultrasound image documenting patency of the accessed vessel. The microwire was utilized to measure  appropriate catheter length. A subcutaneous port pocket was then created along the upper chest wall utilizing a combination of sharp and blunt dissection. The pocket was irrigated with sterile saline. A single lumen "Slim" sized power injectable port was chosen for placement. The 8 Fr catheter was tunneled from the port pocket site to the venotomy incision. The port was placed in the pocket. The external catheter was trimmed to appropriate length. At the venotomy, an 8 Fr peel-away sheath was placed over a guidewire under fluoroscopic guidance. The catheter was then placed through the sheath and the sheath was removed. Final catheter positioning was confirmed and documented with a fluoroscopic spot radiograph. The port was accessed with a Huber needle, aspirated and flushed with heparinized saline. The venotomy site was closed with an interrupted 4-0 Vicryl suture.   The port pocket incision was closed with interrupted 2-0 Vicryl suture. Dermabond and Steri-strips were applied to both incisions. Dressings were applied. The patient tolerated the procedure well without immediate post procedural complication. FINDINGS: After catheter placement, the tip lies within the superior cavoatrial junction. The catheter aspirates and flushes normally and is ready for immediate use. IMPRESSION: Successful placement of a right internal jugular approach power injectable Port-A-Cath. The catheter is ready for immediate use. Electronically Signed   By: John  Watts M.D.   On: 09/04/2019 13:15    ASSESSMENT AND PLAN:  This is a very pleasant 64 years old white male with a stage IB non-small cell lung cancer, adenocarcinoma status post wedge resection of the left upper lobe followed by 4 cycles of adjuvant systemic chemotherapy with cisplatin and Alimta and he tolerated his treatment well except for fatigue. The patient has been on observation since June 2018.   The recent imaging studies including CT scan of the chest as well as a  PET scan showed evidence for disease recurrence with metastatic disease presented with bilateral pulmonary nodules as well as destructive bone lesions at T4 vertebral body, biopsy proven to be metastatic adenocarcinoma. Molecular studies by foundation 1 showed no actionable mutations.  PDL 1 expression was negative. The patient is currently undergoing treatment with carboplatin, Alimta and Ketruda (pembrolizumab) status post 30 cycles.  Starting from cycle #5 he is on maintenance treatment with Alimta and Keytruda every 3 weeks. The patient continues to tolerate his treatment well with no concerning adverse effects. I recommended for him to proceed with cycle #31 today as planned. He will come back for follow-up visit in 3 weeks for evaluation with repeat CT scan of the chest, abdomen pelvis for restaging of his disease. The patient was advised to call immediately if he has any concerning symptoms in the interval. The patient voices understanding of current disease status and treatment options and is in agreement with the current care plan. All questions were answered. The patient knows to call the clinic with any problems, questions or concerns. We can certainly see the patient much sooner if necessary. Disclaimer: This note was dictated with voice recognition software. Similar sounding words can inadvertently be transcribed and may be missed upon review. Stepfon Rawles K Kort Stettler, MD 10/01/19       

## 2019-10-01 NOTE — Progress Notes (Signed)
[place] [phone]   [phone]  OFFICE PROGRESS NOTE  [name], MD [address] [place] [address]  DIAGNOSIS: Metastatic non-small cell lung cancer, adenocarcinoma initially diagnosed as stage IB (T2a, N0, M0) non-small cell lung cancer, adenocarcinoma diagnosed in December 2017.  The patient has evidence for disease recurrence in July 2019.  Biomarker Findings Tumor Mutational Burden - TMB-Intermediate (16 Muts/Mb) Microsatellite status - MS-Stable Genomic Findings For a complete list of the genes assayed, please refer to the Appendix. KIT amplification KRAS J50K SMAD4 splice site [id number]>X HB71 I232F 7 Disease relevant genes with no reportable alterations: EGFR, ALK, BRAF, MET, RET, ERBB2, ROS1   PDL 1 expression is 0%  PRIOR THERAPY:  1) status post left lower lobectomy as well as wedge resection of the left upper lobe on 05/11/2016. 2) Adjuvant systemic chemotherapy with cisplatin 75 MG/M2 and Alimta 500 MG/M2 every 3 weeks. First dose 07/03/2016. Status post 4 cycles.  CURRENT THERAPY: Systemic chemotherapy with carboplatin for AUC of 5, Alimta 500 mg/M2 and Keytruda 200 mg IV every 3 weeks.  Status post 30 cycles.  Starting from cycle #5 the patient will be treated with maintenance Alimta and Ketruda (pembrolizumab) every 3 weeks.  INTERVAL HISTORY: [name] 64 y.o. male returns to the clinic today for follow-up visit.  The patient is feeling fine today with no concerning complaints.  He denied having any chest pain, shortness of breath, cough or hemoptysis.  He denied having any fever or chills.  He has no nausea, vomiting, diarrhea or constipation.  He has no recent weight loss or night sweats.  He continues to tolerate his treatment with maintenance Alimta and Keytruda fairly well.  The patient is here today for evaluation before starting cycle #31.  MEDICAL HISTORY: Past Medical History:  Diagnosis  Date  . AAA (abdominal aortic aneurysm) ([place])   . AAA (abdominal aortic aneurysm) without rupture ([place]) 02/04/2014  . Cancer ([place])    lung  . Encounter for antineoplastic chemotherapy 06/06/2016  . History of hepatitis B   . HNP (herniated nucleus pulposus), lumbar    L4 with radiculopathy  . Hypertension   . Lung cancer ([place]) 05/18/2016  . Malignant neoplasm of lower lobe of left lung ([place]) 04/05/2016  . Mass of left lung   . Mitral insufficiency 04/06/2016   This patient will eventually need MV repair if his prognosis is good from oncology standpoint. However, his lung cancer therapy  is the priority right now. His cardiac condition won't preclude possible lung surgery.  Once his lung cancer is under control he will need a TEE to further evaluate his mitral valve anatomy and MR severity, and also have ischemic workup as tere is evidence on calcificati  . Renal insufficiency 10/09/2016  . S/P partial lobectomy of lung 05/11/2016  . Varicose veins of legs     ALLERGIES:  is allergic to tramadol.  MEDICATIONS:  Current Outpatient Medications  Medication Sig Dispense Refill  . carvedilol (COREG) 6.25 MG tablet Take 1 tablet (6.25 mg total) by mouth 2 (two) times daily. 696 tablet 3  . folic acid (FOLVITE) 1 MG tablet TAKE 1 TABLET BY MOUTH EVERY DAY (Patient taking differently: Take 1 mg by mouth daily. ) 90 tablet 1  . lidocaine-prilocaine (EMLA) cream Apply 1 application topically as needed. Beginning may June 1st   apply a small amount of emla cream  to a  cotton ball and apply over port site 1-2  Hours prior to chemotherapy . Do not rub in cream . Cover with plastic wrap. (Patient taking differently: Apply 1 application topically as needed [facility]). Beginning may June 1st   apply a small amount of emla cream  to a cotton ball and apply over port site 1-2  Hours prior to chemotherapy . Do not rub in cream . Cover with plastic wrap.) 30 g 0  . lisinopril (ZESTRIL) 2.5 MG tablet TAKE 1 TABLET BY  MOUTH EVERY DAY (Patient taking differently: Take 2.5 mg by mouth daily. ) 90 tablet 1   No current facility-administered medications for this visit.   Facility-Administered Medications Ordered in Other Visits  Medication Dose Route Frequency Provider Last Rate Last Admin  . sodium chloride flush (NS) 0.9 % injection 10 mL  10 mL Intravenous PRN Curt Bears, MD   10 mL at 10/01/19 0805    SURGICAL HISTORY:  Past Surgical History:  Procedure Laterality Date  . ABDOMINAL AORTIC ENDOVASCULAR STENT GRAFT N/A 03/12/2016   Procedure: ABDOMINAL AORTIC ENDOVASCULAR STENT GRAFT;  Surgeon: Conrad [place], MD;  Location: [facility];  Service: Vascular;  Laterality: N/A;  . COLONOSCOPY    . INGUINAL HERNIA REPAIR Left 1975   Left inguinal hernia  . INGUINAL HERNIA REPAIR Right   . IR IMAGING GUIDED PORT INSERTION  09/04/2019  . LOBECTOMY Left 05/11/2016   Procedure: LEFT LOWER LOBE LOBECTOMY AND LEFT UPPER LOBE  RESECTION AND PLACEMENT OF ON-Q;  Surgeon: [name], MD;  Location: [facility];  Service: Thoracic;  Laterality: Left;  . LUMBAR LAMINECTOMY  October 22, 2012  . LYMPH NODE DISSECTION Left 05/11/2016   Procedure: LYMPH NODE DISSECTION;  Surgeon: [name], MD;  Location: [facility];  Service: Thoracic;  Laterality: Left;  . STAPLING OF BLEBS Left 05/11/2016   Procedure: STAPLING OF APICAL BLEB;  Surgeon: [name], MD;  Location: [facility];  Service: Thoracic;  Laterality: Left;  Marland Kitchen VIDEO ASSISTED THORACOSCOPY Left 05/18/2016   Procedure: VIDEO ASSISTED THORACOSCOPY WITH REMOVAL OF LEFT APICAL BLEB;  Surgeon: [name], MD;  Location: [facility];  Service: Thoracic;  Laterality: Left;  Marland Kitchen VIDEO ASSISTED THORACOSCOPY (VATS)/WEDGE RESECTION Left 05/11/2016   Procedure: LEFT VIDEO ASSISTED THORACOSCOPY (VATS);  Surgeon: [name], MD;  Location: [facility];  Service: Thoracic;  Laterality: Left;  Marland Kitchen VIDEO BRONCHOSCOPY N/A 05/11/2016   Procedure: VIDEO BRONCHOSCOPY, LEFT LUNG;  Surgeon:  [name], MD;  Location: [facility];  Service: Thoracic;  Laterality: N/A;  . VIDEO BRONCHOSCOPY N/A 05/18/2016   Procedure: VIDEO BRONCHOSCOPY WITH BRONCHIAL WASHING;  Surgeon: [name], MD;  Location: [facility];  Service: Thoracic;  Laterality: N/A;    REVIEW OF SYSTEMS:  A comprehensive review of systems was negative.   PHYSICAL EXAMINATION: General appearance: alert, cooperative and no distress Head: Normocephalic, without obvious abnormality, atraumatic Neck: no adenopathy, no JVD, supple, symmetrical, trachea midline and thyroid not enlarged, symmetric, no tenderness/mass/nodules Lymph nodes: Cervical, supraclavicular, and axillary nodes normal. Resp: clear to auscultation bilaterally Back: symmetric, no curvature. ROM normal. No CVA tenderness. Cardio: regular rate and rhythm, S1, S2 normal, no murmur, click, rub or gallop GI: soft, non-tender; bowel sounds normal; no masses,  no organomegaly Extremities: extremities normal, atraumatic, no cyanosis or edema   ECOG PERFORMANCE STATUS: 1 - Symptomatic but completely ambulatory  Blood pressure 134/76, pulse 81, temperature (!) 97.2 F (36.2 C), temperature source Temporal, resp. rate  18, height _0  (1.905 m), weight 158 lb 6.4 oz (71.8 kg), SpO2 98 %.  LABORATORY DATA: Lab Results  Component Value Date   WBC 5.6 09/10/2019   HGB 12.7 (L) 09/10/2019   HCT 38.7 (L) 09/10/2019   MCV 99.2 09/10/2019   PLT 469 (H) 09/10/2019      Chemistry      Component Value Date/Time   NA 136 09/10/2019 0839   NA 138 01/11/2017 1343   K 4.3 09/10/2019 0839   K 4.7 01/11/2017 1343   CL 106 09/10/2019 0839   CO2 22 09/10/2019 0839   CO2 23 01/11/2017 1343   BUN 11 09/10/2019 0839   BUN 20.2 01/11/2017 1343   CREATININE 1.40 (H) 09/10/2019 0839   CREATININE 1.6 (H) 01/11/2017 1343      Component Value Date/Time   CALCIUM 8.6 (L) 09/10/2019 0839   CALCIUM 9.3 01/11/2017 1343   ALKPHOS 93 09/10/2019 0839   ALKPHOS 89  01/11/2017 1343   AST 26 09/10/2019 0839   AST 41 (H) 01/11/2017 1343   ALT 11 09/10/2019 0839   ALT 27 01/11/2017 1343   BILITOT 0.3 09/10/2019 0839   BILITOT 0.49 01/11/2017 1343       RADIOGRAPHIC STUDIES: IR IMAGING GUIDED PORT INSERTION  Result Date: 09/04/2019 INDICATION: History of lung cancer. In need of durable intravenous access for chemotherapy administration. EXAM: IMPLANTED PORT A CATH PLACEMENT WITH ULTRASOUND AND FLUOROSCOPIC GUIDANCE COMPARISON:  Chest CT-07/28/2019 MEDICATIONS: Ancef 2 gm IV; The antibiotic was administered within an appropriate time interval prior to skin puncture. ANESTHESIA/SEDATION: Moderate (conscious) sedation was employed during this procedure. A total of Versed 2 mg and Fentanyl 100 mcg was administered intravenously. Moderate Sedation Time: 24 minutes. The patient's level of consciousness and vital signs were monitored continuously by radiology nursing throughout the procedure under my direct supervision. CONTRAST:  None FLUOROSCOPY TIME:  42 seconds (4 mGy) COMPLICATIONS: None immediate. PROCEDURE: The procedure, risks, benefits, and alternatives were explained to the patient. Questions regarding the procedure were encouraged and answered. The patient understands and consents to the procedure. The right neck and chest were prepped with chlorhexidine in a sterile fashion, and a sterile drape was applied covering the operative field. Maximum barrier sterile technique with sterile gowns and gloves were used for the procedure. A timeout was performed prior to the initiation of the procedure. Local anesthesia was provided with 1% lidocaine with epinephrine. After creating a small venotomy incision, a micropuncture kit was utilized to access the internal jugular vein. Real-time ultrasound guidance was utilized for vascular access including the acquisition of a permanent ultrasound image documenting patency of the accessed vessel. The microwire was utilized to measure  appropriate catheter length. A subcutaneous port pocket was then created along the upper chest wall utilizing a combination of sharp and blunt dissection. The pocket was irrigated with sterile saline. A single lumen "Slim" sized power injectable port was chosen for placement. The 8 Fr catheter was tunneled from the port pocket site to the venotomy incision. The port was placed in the pocket. The external catheter was trimmed to appropriate length. At the venotomy, an 8 Fr peel-away sheath was placed over a guidewire under fluoroscopic guidance. The catheter was then placed through the sheath and the sheath was removed. Final catheter positioning was confirmed and documented with a fluoroscopic spot radiograph. The port was accessed with a Huber needle, aspirated and flushed with heparinized saline. The venotomy site was closed with an interrupted 4-0 Vicryl suture.  The port pocket incision was closed with interrupted 2-0 Vicryl suture. Dermabond and Steri-strips were applied to both incisions. Dressings were applied. The patient tolerated the procedure well without immediate post procedural complication. FINDINGS: After catheter placement, the tip lies within the superior cavoatrial junction. The catheter aspirates and flushes normally and is ready for immediate use. IMPRESSION: Successful placement of a right internal jugular approach power injectable Port-A-Cath. The catheter is ready for immediate use. Electronically Signed   By: Sandi Mariscal M.D.   On: 09/04/2019 13:15    ASSESSMENT AND PLAN:  This is a very pleasant 64 years old white male with a stage IB non-small cell lung cancer, adenocarcinoma status post wedge resection of the left upper lobe followed by 4 cycles of adjuvant systemic chemotherapy with cisplatin and Alimta and he tolerated his treatment well except for fatigue. The patient has been on observation since June 2018.   The recent imaging studies including CT scan of the chest as well as a  PET scan showed evidence for disease recurrence with metastatic disease presented with bilateral pulmonary nodules as well as destructive bone lesions at T4 vertebral body, biopsy proven to be metastatic adenocarcinoma. Molecular studies by foundation 1 showed no actionable mutations.  PDL 1 expression was negative. The patient is currently undergoing treatment with carboplatin, Alimta and Ketruda (pembrolizumab) status post 30 cycles.  Starting from cycle #5 he is on maintenance treatment with Alimta and Keytruda every 3 weeks. The patient continues to tolerate his treatment well with no concerning adverse effects. I recommended for him to proceed with cycle #31 today as planned. He will come back for follow-up visit in 3 weeks for evaluation with repeat CT scan of the chest, abdomen pelvis for restaging of his disease. The patient was advised to call immediately if he has any concerning symptoms in the interval. The patient voices understanding of current disease status and treatment options and is in agreement with the current care plan. All questions were answered. The patient knows to call the clinic with any problems, questions or concerns. We can certainly see the patient much sooner if necessary. Disclaimer: This note was dictated with voice recognition software. Similar sounding words can inadvertently be transcribed and may be missed upon review. Eilleen Kempf, MD 10/01/19

## 2019-10-01 NOTE — Patient Instructions (Signed)
[facility] Discharge Instructions for Patients Receiving Chemotherapy  Today you received the following immunotherapy agent: Pembrolizumab (Keytruda) and chemotherapy agent: Pemetrexed (Alimta)  To help prevent nausea and vomiting after your treatment, we encourage you to take your nausea medication as directed by your MD.   If you develop nausea and vomiting that is not controlled by your nausea medication, call the clinic.   BELOW ARE SYMPTOMS THAT SHOULD BE REPORTED IMMEDIATELY:  *FEVER GREATER THAN 100.5 F  *CHILLS WITH OR WITHOUT FEVER  NAUSEA AND VOMITING THAT IS NOT CONTROLLED WITH YOUR NAUSEA MEDICATION  *UNUSUAL SHORTNESS OF BREATH  *UNUSUAL BRUISING OR BLEEDING  TENDERNESS IN MOUTH AND THROAT WITH OR WITHOUT PRESENCE OF ULCERS  *URINARY PROBLEMS  *BOWEL PROBLEMS  UNUSUAL RASH Items with * indicate a potential emergency and should be followed up as soon as possible.  Feel free to call the clinic should you have any questions or concerns. The clinic phone number is [phone].  Please show the [facility] at check-in to the Emergency Department and triage nurse.

## 2019-10-05 ENCOUNTER — Other Ambulatory Visit: Payer: Self-pay

## 2019-10-05 ENCOUNTER — Other Ambulatory Visit (HOSPITAL_COMMUNITY): Payer: No Typology Code available for payment source

## 2019-10-05 ENCOUNTER — Other Ambulatory Visit (HOSPITAL_COMMUNITY)
Admission: RE | Admit: 2019-10-05 | Discharge: 2019-10-05 | Disposition: A | Discharge status: Home / Self Care | Payer: No Typology Code available for payment source | Source: Ambulatory Visit | Attending: Cardiology | Admitting: Cardiology

## 2019-10-05 ENCOUNTER — Other Ambulatory Visit: Payer: No Typology Code available for payment source | Admitting: *Deleted

## 2019-10-05 DIAGNOSIS — Z01812 Encounter for preprocedural laboratory examination: Secondary | ICD-10-CM

## 2019-10-05 DIAGNOSIS — Z20822 Contact with and (suspected) exposure to covid-19: Secondary | ICD-10-CM | POA: Insufficient documentation

## 2019-10-05 DIAGNOSIS — I340 Nonrheumatic mitral (valve) insufficiency: Secondary | ICD-10-CM

## 2019-10-05 LAB — CBC
Hematocrit: 38.2 % (ref 37.5–51.0)
Hemoglobin: 13.2 g/dL (ref 13.0–17.7)
MCH: 32.6 pg (ref 26.6–33.0)
MCHC: 34.6 g/dL (ref 31.5–35.7)
MCV: 94 fL (ref 79–97)
Platelets: 327 10*3/uL (ref 150–450)
RBC: 4.05 x10E6/uL — ABNORMAL LOW (ref 4.14–5.80)
RDW: 15.6 % — ABNORMAL HIGH (ref 11.6–15.4)
WBC: 7.3 10*3/uL (ref 3.4–10.8)

## 2019-10-05 LAB — BASIC METABOLIC PANEL
BUN/Creatinine Ratio: 13 (ref 10–24)
BUN: 14 mg/dL (ref 8–27)
CO2: 22 mmol/L (ref 20–29)
Calcium: 9.1 mg/dL (ref 8.6–10.2)
Chloride: 103 mmol/L (ref 96–106)
Creatinine, Ser: 1.04 mg/dL (ref 0.76–1.27)
GFR calc Af Amer: 86 mL/min/{1.73_m2} (ref >59)
GFR calc non Af Amer: 74 mL/min/{1.73_m2} (ref >59)
Glucose: 105 mg/dL — ABNORMAL HIGH (ref 65–99)
Potassium: 4.4 mmol/L (ref 3.5–5.2)
Sodium: 132 mmol/L — ABNORMAL LOW (ref 134–144)

## 2019-10-05 LAB — SARS CORONAVIRUS 2 (TAT 6-24 HRS): SARS Coronavirus 2: NEGATIVE

## 2019-10-07 ENCOUNTER — Ambulatory Visit (HOSPITAL_BASED_OUTPATIENT_CLINIC_OR_DEPARTMENT_OTHER): Payer: No Typology Code available for payment source

## 2019-10-07 ENCOUNTER — Ambulatory Visit (HOSPITAL_COMMUNITY)
Admission: RE | Admit: 2019-10-07 | Discharge: 2019-10-07 | Disposition: A | Discharge status: Home / Self Care | Payer: No Typology Code available for payment source | Attending: Cardiology | Admitting: Cardiology

## 2019-10-07 ENCOUNTER — Encounter (HOSPITAL_COMMUNITY)
Admission: RE | Disposition: A | Discharge status: Home / Self Care | Payer: No Typology Code available for payment source | Source: Home / Self Care | Attending: Cardiology

## 2019-10-07 ENCOUNTER — Other Ambulatory Visit: Payer: Self-pay

## 2019-10-07 ENCOUNTER — Telehealth: Payer: Self-pay | Admitting: *Deleted

## 2019-10-07 DIAGNOSIS — I10 Essential (primary) hypertension: Secondary | ICD-10-CM | POA: Insufficient documentation

## 2019-10-07 DIAGNOSIS — I083 Combined rheumatic disorders of mitral, aortic and tricuspid valves: Secondary | ICD-10-CM | POA: Insufficient documentation

## 2019-10-07 DIAGNOSIS — I714 Abdominal aortic aneurysm, without rupture: Secondary | ICD-10-CM | POA: Insufficient documentation

## 2019-10-07 DIAGNOSIS — Z885 Allergy status to narcotic agent status: Secondary | ICD-10-CM | POA: Insufficient documentation

## 2019-10-07 DIAGNOSIS — Z79899 Other long term (current) drug therapy: Secondary | ICD-10-CM | POA: Diagnosis not present

## 2019-10-07 DIAGNOSIS — Z85118 Personal history of other malignant neoplasm of bronchus and lung: Secondary | ICD-10-CM | POA: Diagnosis not present

## 2019-10-07 DIAGNOSIS — I340 Nonrheumatic mitral (valve) insufficiency: Secondary | ICD-10-CM

## 2019-10-07 DIAGNOSIS — Z9221 Personal history of antineoplastic chemotherapy: Secondary | ICD-10-CM | POA: Diagnosis not present

## 2019-10-07 HISTORY — PX: TEE WITHOUT CARDIOVERSION: SHX5443

## 2019-10-07 SURGERY — ECHOCARDIOGRAM, TRANSESOPHAGEAL
Anesthesia: Moderate Sedation

## 2019-10-07 MED ORDER — SODIUM CHLORIDE 0.9 % IV SOLN: INTRAVENOUS | Status: DC

## 2019-10-07 MED ORDER — BUTAMBEN-TETRACAINE-BENZOCAINE 2-2-14 % EX AERO
INHALATION_SPRAY | CUTANEOUS | Status: DC | PRN
  Administered 2019-10-07: 2 via TOPICAL

## 2019-10-07 MED ORDER — FENTANYL CITRATE (PF) 100 MCG/2ML IJ SOLN
INTRAMUSCULAR | Status: DC | PRN
  Administered 2019-10-07 (×4): 25 ug via INTRAVENOUS

## 2019-10-07 MED ORDER — FENTANYL CITRATE (PF) 100 MCG/2ML IJ SOLN
INTRAMUSCULAR | Status: AC
  Filled 2019-10-07: qty 4

## 2019-10-07 MED ORDER — MIDAZOLAM HCL (PF) 5 MG/ML IJ SOLN
INTRAMUSCULAR | Status: AC
  Filled 2019-10-07: qty 2

## 2019-10-07 MED ORDER — MIDAZOLAM HCL (PF) 10 MG/2ML IJ SOLN
INTRAMUSCULAR | Status: DC | PRN
  Administered 2019-10-07: 1 mg via INTRAVENOUS
  Administered 2019-10-07: 2 mg via INTRAVENOUS
  Administered 2019-10-07: 1 mg via INTRAVENOUS
  Administered 2019-10-07 (×2): 2 mg via INTRAVENOUS

## 2019-10-07 MED ORDER — DIPHENHYDRAMINE HCL 50 MG/ML IJ SOLN
INTRAMUSCULAR | Status: AC
  Filled 2019-10-07: qty 1

## 2019-10-07 MED ORDER — HEPARIN SOD (PORK) LOCK FLUSH 100 UNIT/ML IV SOLN
500.0000 [IU] | INTRAVENOUS | Status: AC | PRN
  Administered 2019-10-07: 500 [IU]

## 2019-10-07 NOTE — Progress Notes (Signed)
°  Echocardiogram Echocardiogram Transesophageal has been performed.  MARKEY DEADY 10/07/2019, 12:40 PM

## 2019-10-07 NOTE — Telephone Encounter (Addendum)
Referral placed and will send our [facility] schedulers a message to call the pt to coordinate a consult appt at [facility] with Dr. Roxy Manns, for evaluation for surgical repair of severe MR.  [facility] will follow-up with the pt.

## 2019-10-07 NOTE — Discharge Instructions (Signed)
TEE   YOU HAD AN CARDIAC PROCEDURE TODAY: Refer to the procedure report and other information in the discharge instructions given to you for any specific questions about what was found during the examination. If this information does not answer your questions, please call Dr. Irven Shelling office at [phone] to clarify.   DIET: Your first meal following the procedure should be a light meal and then it is ok to progress to your normal diet. A half-sandwich or bowl of soup is an example of a good first meal. Heavy or fried foods are harder to digest and may make you feel nauseous or bloated. Drink plenty of fluids but you should avoid alcoholic beverages for 24 hours. If you had a esophageal dilation, please see attached instructions for diet.   ACTIVITY: Your care partner should take you home directly after the procedure. You should plan to take it easy, moving slowly for the rest of the day. You can resume normal activity the day after the procedure however YOU SHOULD NOT DRIVE, use power tools, machinery or perform tasks that involve climbing or major physical exertion for 24 hours (because of the sedation medicines used during the test).   SYMPTOMS TO REPORT IMMEDIATELY: A cardiologist can be reached at any hour. Please call [phone] for any of the following symptoms:  Vomiting of blood or coffee ground material  New, significant abdominal pain  New, significant chest pain or pain under the shoulder blades  Painful or persistently difficult swallowing  New shortness of breath  Black, tarry-looking or red, bloody stools  FOLLOW UP:  Please also call with any specific questions about appointments or follow up tests.

## 2019-10-07 NOTE — Interval H&P Note (Signed)
History and Physical Interval Note:  10/07/2019 10:41 AM  William Kales [name]  has presented today for surgery, with the diagnosis of [place].  The various methods of treatment have been discussed with the patient and family. After consideration of risks, benefits and other options for treatment, the patient has consented to  Procedure(s): TRANSESOPHAGEAL ECHOCARDIOGRAM (TEE) (N/A) as a surgical intervention.  The patient's history has been reviewed, patient examined, no change in status, stable for surgery.  I have reviewed the patient's chart and labs.  Questions were answered to the patient's satisfaction.     [name]

## 2019-10-07 NOTE — CV Procedure (Signed)
     Transesophageal Echocardiogram Note  [name] [id number] 05-15-1955  Procedure: Transesophageal Echocardiogram Indications: mitral regurgitation  Procedure Details Consent: Obtained Time Out: Verified patient identification, verified procedure, site/side was marked, verified correct patient position, special equipment/implants available, Radiology Safety Procedures followed,  medications/allergies/relevent history reviewed, required imaging and test results available.  Performed  Medications: During this procedure the patient is administered a total of Versed 8 mg and Fentanyl 100 mg to achieve and maintain moderate conscious sedation.  The patient's heart rate, blood pressure, and oxygen saturation are monitored continuously during the procedure. The period of conscious sedation is 40 minutes, of which I was present face-to-face 100% of this time.  4+ mitral primary mitral regurgitation with myxomatous anterior leaflet and severe eccentric posteriorly directed jet with reversal of forward flow in the pulmonary veins B/L.  For full dictation see TEE note.  Complications: No apparent complications Patient did tolerate procedure well.  William Dawley, MD, [facility] 10/07/2019, 1:13 PM

## 2019-10-08 ENCOUNTER — Encounter (HOSPITAL_COMMUNITY): Payer: Self-pay | Admitting: Cardiology

## 2019-10-08 NOTE — Telephone Encounter (Signed)
Pt has a new pt consult appt at [facility], to see Dr.Owen, on 6/21/1 at 3 pm.  Pt made aware of appt date and time by Scheduling.

## 2019-10-09 ENCOUNTER — Telehealth: Payer: Self-pay | Admitting: Cardiology

## 2019-10-09 NOTE — Telephone Encounter (Signed)
Patient had a Transesophageal echo on 0616/21. He states that it feels like the top part of his mouth is coming loose and choking him. Please advise.

## 2019-10-09 NOTE — Telephone Encounter (Signed)
Pt had TEE done by Dr. Meda Coffee on 6/16.  He is calling in now to report to Dr. Meda Coffee that ever since he had this done, his gag reflex is extra sensitive. He states he is not coughing, there is no obvious trauma to the inside of his mouth or throat, he is able to swallow food and drink fluids without any difficulty, and he reports he is having no difficulty breathing.  Pt would like for Dr. Meda Coffee to advise on what he should do. Informed the pt that Dr. Meda Coffee was able to hear him talk while on the phone with me, and she is sitting beside me and has further recommendations.  Informed the pt that Dr. Meda Coffee advised that he try throat lozenges or chloraseptic throat spray to help numb his throat and ease his gag reflex, and call us on Monday 6/21 to report if he is better or not. Advised the pt that if at any time he has difficulty breathing, notices swelling to his throat or inside of mouth, notices trauma inside his mouth, has difficulty swallowing, has uncontrollable coughing, then he should call 911 or refer to the ER immediately.  Pt verbalized understanding and agrees with this plan. Pt states he will keep [place] updated on Monday.

## 2019-10-12 ENCOUNTER — Encounter: Payer: No Typology Code available for payment source | Admitting: Thoracic Surgery (Cardiothoracic Vascular Surgery)

## 2019-10-21 ENCOUNTER — Other Ambulatory Visit: Payer: Self-pay

## 2019-10-21 ENCOUNTER — Ambulatory Visit (HOSPITAL_COMMUNITY)
Admission: RE | Admit: 2019-10-21 | Discharge: 2019-10-21 | Disposition: A | Discharge status: Home / Self Care | Payer: No Typology Code available for payment source | Source: Ambulatory Visit | Attending: Internal Medicine | Admitting: Internal Medicine

## 2019-10-21 DIAGNOSIS — C3490 Malignant neoplasm of unspecified part of unspecified bronchus or lung: Secondary | ICD-10-CM

## 2019-10-21 MED ORDER — SODIUM CHLORIDE (PF) 0.9 % IJ SOLN
INTRAMUSCULAR | Status: AC
  Filled 2019-10-21: qty 50

## 2019-10-21 MED ORDER — IOHEXOL 300 MG/ML  SOLN
100.0000 mL | Freq: Once | INTRAMUSCULAR | Status: AC | PRN
  Administered 2019-10-21: 100 mL via INTRAVENOUS

## 2019-10-21 MED ORDER — HEPARIN SOD (PORK) LOCK FLUSH 100 UNIT/ML IV SOLN
INTRAVENOUS | Status: AC
  Filled 2019-10-21: qty 5

## 2019-10-21 MED ORDER — HEPARIN SOD (PORK) LOCK FLUSH 100 UNIT/ML IV SOLN
500.0000 [IU] | Freq: Once | INTRAVENOUS | Status: AC
  Administered 2019-10-21: 500 [IU] via INTRAVENOUS

## 2019-10-21 NOTE — Progress Notes (Signed)
[place] OFFICE PROGRESS NOTE  [name], [place] [address] [place] [address]  DIAGNOSIS: Metastatic non-small cell lung cancer, adenocarcinoma initially diagnosed as stage IB (T2a, N0, M0) non-small cell lung cancer, adenocarcinoma diagnosed in December 2017. The patient has evidence for disease recurrence in July 2019.  Biomarker Findings Tumor Mutational Burden - TMB-Intermediate (16 Muts/Mb) Microsatellite status - MS-Stable Genomic Findings For a complete list of the genes assayed, please refer to the Appendix. KIT amplification KRAS N05L SMAD4 splice site 9767-3A>L PF79 I232F 7 Disease relevant genes with no reportable alterations: EGFR, ALK, BRAF, MET, RET, ERBB2, ROS1   PDL 1 expression is 0%  PRIOR THERAPY: 1) status post left lower lobectomy as well as wedge resection of the left upper lobe on 05/11/2016. 2) Adjuvant systemic chemotherapy with cisplatin 75 MG/M2 and Alimta 500 MG/M2 every 3 weeks. First dose 07/03/2016. Status post 4 cycles.  CURRENT THERAPY: Systemic chemotherapy with carboplatin for AUC of 5, Alimta 500 mg/M2 and Keytruda 200 mg IV every 3 weeks. Status post 31cycles. Starting from cycle #5 the patient will be treated with maintenance Alimta and Ketruda (pembrolizumab) every 3 weeks.   INTERVAL HISTORY: William Kales [name] 64 y.o. male returns to the clinic for a follow up visit. The patient is feeling well today without any concerning complaints. He recently had a TEE to assess his mitral valve. He follows closely with cardiology. The patient continues to tolerate treatment with maintenance alimta and keytruda well without any adverse side effects. Denies any fever, chills, or night sweats. He lost a few pounds since his last appointment. He states he still has an appetite but his wife has hyperlipidemia and him and his wife have changed their dietary habits recently which he attributes to his weight loss. Denies any chest  pain, shortness of breath, cough, or hemoptysis. Denies any nausea, vomiting, diarrhea, or constipation. Denies any headache or visual changes. Denies any rashes or skin changes. The patient recently had a restaging CT scan performed. The patient is here today for evaluation prior to starting cycle # 32  MEDICAL HISTORY: Past Medical History:  Diagnosis Date  . AAA (abdominal aortic aneurysm) ([place])   . AAA (abdominal aortic aneurysm) without rupture ([place]) 02/04/2014  . Cancer ([place])    lung  . Encounter for antineoplastic chemotherapy 06/06/2016  . History of hepatitis B   . HNP (herniated nucleus pulposus), lumbar    L4 with radiculopathy  . Hypertension   . Lung cancer ([place]) 05/18/2016  . Malignant neoplasm of lower lobe of left lung ([place]) 04/05/2016  . Mass of left lung   . Mitral insufficiency 04/06/2016   This patient will eventually need MV repair if his prognosis is good from oncology standpoint. However, his lung cancer therapy  is the priority right now. His cardiac condition won't preclude possible lung surgery.  Once his lung cancer is under control he will need a TEE to further evaluate his mitral valve anatomy and MR severity, and also have ischemic workup as tere is evidence on calcificati  . Renal insufficiency 10/09/2016  . S/P partial lobectomy of lung 05/11/2016  . Varicose veins of legs     ALLERGIES:  is allergic to tramadol.  MEDICATIONS:  Current Outpatient Medications  Medication Sig Dispense Refill  . carvedilol (COREG) 6.25 MG tablet Take 1 tablet (6.25 mg total) by mouth 2 (two) times daily. 024 tablet 3  . folic acid (FOLVITE) 1 MG tablet TAKE 1  TABLET BY MOUTH EVERY DAY (Patient taking differently: Take 1 mg by mouth daily. ) 90 tablet 1  . lidocaine-prilocaine (EMLA) cream Apply 1 application topically as needed. Beginning may June 1st   apply a small amount of emla cream  to a cotton ball and apply over port site 1-2  Hours prior to chemotherapy . Do not rub in  cream . Cover with plastic wrap. (Patient taking differently: Apply 1 application topically as needed [facility]). Beginning may June 1st   apply a small amount of emla cream  to a cotton ball and apply over port site 1-2  Hours prior to chemotherapy . Do not rub in cream . Cover with plastic wrap.) 30 g 0  . lisinopril (ZESTRIL) 2.5 MG tablet TAKE 1 TABLET BY MOUTH EVERY DAY (Patient taking differently: Take 2.5 mg by mouth daily. ) 90 tablet 1   No current facility-administered medications for this visit.   Facility-Administered Medications Ordered in Other Visits  Medication Dose Route Frequency Provider Last Rate Last Admin  . 0.9 %  sodium chloride infusion   Intravenous Once Curt Bears, MD      . dexamethasone (DECADRON) 10 mg in sodium chloride 0.9 % 50 mL IVPB  10 mg Intravenous Once Curt Bears, MD      . heparin lock flush 100 unit/mL  500 Units Intracatheter Once PRN Curt Bears, MD      . ondansetron [facility]) injection 8 mg  8 mg Intravenous Once Curt Bears, MD      . pembrolizumab [facility]) 200 mg in sodium chloride 0.9 % 50 mL chemo infusion  200 mg Intravenous Once Curt Bears, MD      . PEMEtrexed (ALIMTA) 1,000 mg in sodium chloride 0.9 % 100 mL chemo infusion  500 mg/m2 (Treatment Plan Recorded) Intravenous Once Curt Bears, MD      . sodium chloride flush (NS) 0.9 % injection 10 mL  10 mL Intracatheter PRN Curt Bears, MD        SURGICAL HISTORY:  Past Surgical History:  Procedure Laterality Date  . ABDOMINAL AORTIC ENDOVASCULAR STENT GRAFT N/A 03/12/2016   Procedure: ABDOMINAL AORTIC ENDOVASCULAR STENT GRAFT;  Surgeon: Conrad Maywood, MD;  Location: [facility];  Service: Vascular;  Laterality: N/A;  . COLONOSCOPY    . INGUINAL HERNIA REPAIR Left 1975   Left inguinal hernia  . INGUINAL HERNIA REPAIR Right   . IR IMAGING GUIDED PORT INSERTION  09/04/2019  . LOBECTOMY Left 05/11/2016   Procedure: LEFT LOWER LOBE LOBECTOMY AND LEFT UPPER LOBE   RESECTION AND PLACEMENT OF ON-Q;  Surgeon: [name], MD;  Location: [facility];  Service: Thoracic;  Laterality: Left;  . LUMBAR LAMINECTOMY  October 22, 2012  . LYMPH NODE DISSECTION Left 05/11/2016   Procedure: LYMPH NODE DISSECTION;  Surgeon: [name], MD;  Location: [facility];  Service: Thoracic;  Laterality: Left;  . STAPLING OF BLEBS Left 05/11/2016   Procedure: STAPLING OF APICAL BLEB;  Surgeon: [name], MD;  Location: [facility];  Service: Thoracic;  Laterality: Left;  . TEE WITHOUT CARDIOVERSION N/A 10/07/2019   Procedure: TRANSESOPHAGEAL ECHOCARDIOGRAM (TEE);  Surgeon: Dorothy Spark, MD;  Location: [facility] ENDOSCOPY;  Service: Cardiovascular;  Laterality: N/A;  . VIDEO ASSISTED THORACOSCOPY Left 05/18/2016   Procedure: VIDEO ASSISTED THORACOSCOPY WITH REMOVAL OF LEFT APICAL BLEB;  Surgeon: [name], MD;  Location: [facility];  Service: Thoracic;  Laterality: Left;  Marland Kitchen VIDEO ASSISTED THORACOSCOPY (VATS)/WEDGE RESECTION Left 05/11/2016  Procedure: LEFT VIDEO ASSISTED THORACOSCOPY (VATS);  Surgeon: [name], MD;  Location: [facility];  Service: Thoracic;  Laterality: Left;  Marland Kitchen VIDEO BRONCHOSCOPY N/A 05/11/2016   Procedure: VIDEO BRONCHOSCOPY, LEFT LUNG;  Surgeon: [name], MD;  Location: [place];  Service: Thoracic;  Laterality: N/A;  . VIDEO BRONCHOSCOPY N/A 05/18/2016   Procedure: VIDEO BRONCHOSCOPY WITH BRONCHIAL WASHING;  Surgeon: [name], MD;  Location: [facility];  Service: Thoracic;  Laterality: N/A;    REVIEW OF SYSTEMS:   Review of Systems  Constitutional: Negative for appetite change, chills, fatigue, fever and unexpected weight change.  HENT: Negative for mouth sores, nosebleeds, sore throat and trouble swallowing.   Eyes: Negative for eye problems and icterus.  Respiratory: Negative for cough, hemoptysis, shortness of breath and wheezing.   Cardiovascular: Negative for chest pain and leg swelling.  Gastrointestinal: Negative for abdominal pain,  constipation, diarrhea, nausea and vomiting.  Genitourinary: Negative for bladder incontinence, difficulty urinating, dysuria, frequency and hematuria.   Musculoskeletal: Negative for back pain, gait problem, neck pain and neck stiffness.  Skin: Negative for itching and rash.  Neurological: Negative for dizziness, extremity weakness, gait problem, headaches, light-headedness and seizures.  Hematological: Negative for adenopathy. Does not bruise/bleed easily.  Psychiatric/Behavioral: Negative for confusion, depression and sleep disturbance. The patient is not nervous/anxious.     PHYSICAL EXAMINATION:  Blood pressure (!) 144/82, pulse 79, temperature (!) 97.5 F (36.4 C), temperature source Temporal, resp. rate 18, height 6' 3.5" (1.918 m), weight 152 lb 8 oz (69.2 kg), SpO2 96 %.  ECOG PERFORMANCE STATUS: 1 - Symptomatic but completely ambulatory  Physical Exam  Constitutional: Oriented to person, place, and time and well-developed, well-nourished, and in no distress. No distress.  HENT:  Head: Normocephalic and atraumatic.  Mouth/Throat: Oropharynx is clear and moist. No oropharyngeal exudate.  Eyes: Conjunctivae are normal. Right eye exhibits no discharge. Left eye exhibits no discharge. No scleral icterus.  Neck: Normal range of motion. Neck supple.  Cardiovascular: Systolic murmur auscultated in the apex as well as in his posterior left thorax. Normal rate, regular rhythm, and intact distal pulses.   Pulmonary/Chest: Effort normal and breath sounds normal. No respiratory distress. No wheezes. No rales.  Abdominal: Soft. Bowel sounds are normal. Exhibits no distension and no mass. There is no tenderness.  Musculoskeletal: Normal range of motion. Exhibits no edema.  Lymphadenopathy:    No cervical adenopathy.  Neurological: Alert and oriented to person, place, and time. Exhibits normal muscle tone. Gait normal. Coordination normal.  Skin: Skin is warm and dry. No rash noted. Not  diaphoretic. No erythema. No pallor.  Psychiatric: Mood, memory and judgment normal.  Vitals reviewed.  LABORATORY DATA: Lab Results  Component Value Date   WBC 7.4 10/22/2019   HGB 13.0 10/22/2019   HCT 39.6 10/22/2019   MCV 100.3 (H) 10/22/2019   PLT 438 (H) 10/22/2019      Chemistry      Component Value Date/Time   NA 135 10/22/2019 0903   NA 132 (L) 10/05/2019 0830   NA 138 01/11/2017 1343   K 3.9 10/22/2019 0903   K 4.7 01/11/2017 1343   CL 103 10/22/2019 0903   CO2 21 (L) 10/22/2019 0903   CO2 23 01/11/2017 1343   BUN 10 10/22/2019 0903   BUN 14 10/05/2019 0830   BUN 20.2 01/11/2017 1343   CREATININE 1.23 10/22/2019 0903   CREATININE 1.6 (H) 01/11/2017 1343      Component Value Date/Time  CALCIUM 8.7 (L) 10/22/2019 0903   CALCIUM 9.3 01/11/2017 1343   ALKPHOS 95 10/22/2019 0903   ALKPHOS 89 01/11/2017 1343   AST 30 10/22/2019 0903   AST 41 (H) 01/11/2017 1343   ALT 13 10/22/2019 0903   ALT 27 01/11/2017 1343   BILITOT 0.4 10/22/2019 0903   BILITOT 0.49 01/11/2017 1343       RADIOGRAPHIC STUDIES:  CT Chest W Contrast  Result Date: 10/21/2019 CLINICAL DATA:  Primary Cancer Type: Lung Imaging Indication: Assess response to therapy Interval therapy since last imaging? Yes Initial Cancer Diagnosis Date: 03/29/2016; established by: Biopsy-proven Detailed Pathology: Stage Ib non-small cell lung cancer, adenocarcinoma. Primary Tumor location: Left lower lobe. Recurrence? Yes; Date(s) of recurrence: 12/02/2017; Established by: Biopsy-proven; metastasis to T4. Surgeries: Left lower lobectomy 05/11/2016. Chemotherapy: Yes; Ongoing? Yes; Most recent administration: 10/01/2019 Radiation therapy?  No Immunotherapy?  Yes; Type: Keytruda; Ongoing? Yes EXAM: CT CHEST, ABDOMEN, AND PELVIS WITH CONTRAST TECHNIQUE: Multidetector CT imaging of the chest, abdomen and pelvis was performed following the standard protocol during bolus administration of intravenous contrast. CONTRAST:   15m OMNIPAQUE IOHEXOL 300 MG/ML  SOLN COMPARISON:  Most recent CT chest, abdomen and pelvis 07/28/2019. 11/12/2017 PET-CT. FINDINGS: CT CHEST FINDINGS Cardiovascular: The heart size is normal. No substantial pericardial effusion. Ascending thoracic aorta measures 4 cm diameter. Right Port-A-Cath tip is positioned in the distal SVC. Distal transverse aorta measures 4 cm diameter. Mediastinum/Nodes: No mediastinal lymphadenopathy. There is no hilar lymphadenopathy. The esophagus has normal imaging features. There is no axillary lymphadenopathy. Lungs/Pleura: Bilateral calcified pleural plaques again noted. Centrilobular and paraseptal emphysema evident. 4 mm anterior right upper lobe nodule on 76/4 is stable. 5 mm nodule posterior right costophrenic sulcus on 145/4 is unchanged. Surgical changes again noted in the left lung. Tiny chronic pleural effusions versus pleural thickening noted inferior hemithorax bilaterally. Musculoskeletal: No worrisome lytic or sclerotic osseous abnormality. CT ABDOMEN PELVIS FINDINGS Hepatobiliary: Stable 9 mm hypoattenuating lesion in the medial segment left liver along the falciform ligament, potentially focal fatty deposition. Other scattered tiny hypodensities in the liver parenchyma are stable and too small to characterize, but likely benign. Gallbladder is nondistended. No intrahepatic or extrahepatic biliary dilation. Pancreas: No focal mass lesion. No dilatation of the main duct. No intraparenchymal cyst. No peripancreatic edema. Spleen:  No splenomegaly. No focal mass lesion. Adrenals/Urinary Tract: No adrenal nodule or mass. Right kidney unremarkable. Tiny subcapsular low-density lesions in the left kidney are too small to characterize, but stable in the interval. No evidence for hydroureter. The urinary bladder appears normal for the degree of distention. Stomach/Bowel: Stomach is unremarkable. No gastric wall thickening. No evidence of outlet obstruction. Duodenum is  normally positioned as is the ligament of Treitz. No small bowel wall thickening. No small bowel dilatation. The terminal ileum is normal. The appendix is normal. No gross colonic mass. No colonic wall thickening. Vascular/Lymphatic: Status post aortic endograft placement. There is no gastrohepatic or hepatoduodenal ligament lymphadenopathy. No retroperitoneal or mesenteric lymphadenopathy. No pelvic sidewall lymphadenopathy. Reproductive: Prostate gland is enlarged. Other: No intraperitoneal free fluid. Musculoskeletal: No worrisome lytic or sclerotic osseous abnormality. Degenerative changes noted L4-5. IMPRESSION: 1. Stable exam. No new or progressive findings in the chest, abdomen, or pelvis to suggest recurrent or metastatic disease. 2. Stable tiny right pulmonary nodules. Continued attention on follow-up recommended. 3. Ascending and distal transverse aorta measure up to 4 cm diameter. This could be reassessed at the time of follow-up. 4. Prostatomegaly. 5. Aortic Atherosclerosis (ICD10-I70.0) and Emphysema (ICD10-J43.9).  Electronically Signed   By: Misty Stanley M.D.   On: 10/21/2019 11:39   CT Abdomen Pelvis W Contrast  Result Date: 10/21/2019 CLINICAL DATA:  Primary Cancer Type: Lung Imaging Indication: Assess response to therapy Interval therapy since last imaging? Yes Initial Cancer Diagnosis Date: 03/29/2016; established by: Biopsy-proven Detailed Pathology: Stage Ib non-small cell lung cancer, adenocarcinoma. Primary Tumor location: Left lower lobe. Recurrence? Yes; Date(s) of recurrence: 12/02/2017; Established by: Biopsy-proven; metastasis to T4. Surgeries: Left lower lobectomy 05/11/2016. Chemotherapy: Yes; Ongoing? Yes; Most recent administration: 10/01/2019 Radiation therapy?  No Immunotherapy?  Yes; Type: Keytruda; Ongoing? Yes EXAM: CT CHEST, ABDOMEN, AND PELVIS WITH CONTRAST TECHNIQUE: Multidetector CT imaging of the chest, abdomen and pelvis was performed following the standard protocol  during bolus administration of intravenous contrast. CONTRAST:  171m OMNIPAQUE IOHEXOL 300 MG/ML  SOLN COMPARISON:  Most recent CT chest, abdomen and pelvis 07/28/2019. 11/12/2017 PET-CT. FINDINGS: CT CHEST FINDINGS Cardiovascular: The heart size is normal. No substantial pericardial effusion. Ascending thoracic aorta measures 4 cm diameter. Right Port-A-Cath tip is positioned in the distal SVC. Distal transverse aorta measures 4 cm diameter. Mediastinum/Nodes: No mediastinal lymphadenopathy. There is no hilar lymphadenopathy. The esophagus has normal imaging features. There is no axillary lymphadenopathy. Lungs/Pleura: Bilateral calcified pleural plaques again noted. Centrilobular and paraseptal emphysema evident. 4 mm anterior right upper lobe nodule on 76/4 is stable. 5 mm nodule posterior right costophrenic sulcus on 145/4 is unchanged. Surgical changes again noted in the left lung. Tiny chronic pleural effusions versus pleural thickening noted inferior hemithorax bilaterally. Musculoskeletal: No worrisome lytic or sclerotic osseous abnormality. CT ABDOMEN PELVIS FINDINGS Hepatobiliary: Stable 9 mm hypoattenuating lesion in the medial segment left liver along the falciform ligament, potentially focal fatty deposition. Other scattered tiny hypodensities in the liver parenchyma are stable and too small to characterize, but likely benign. Gallbladder is nondistended. No intrahepatic or extrahepatic biliary dilation. Pancreas: No focal mass lesion. No dilatation of the main duct. No intraparenchymal cyst. No peripancreatic edema. Spleen:  No splenomegaly. No focal mass lesion. Adrenals/Urinary Tract: No adrenal nodule or mass. Right kidney unremarkable. Tiny subcapsular low-density lesions in the left kidney are too small to characterize, but stable in the interval. No evidence for hydroureter. The urinary bladder appears normal for the degree of distention. Stomach/Bowel: Stomach is unremarkable. No gastric wall  thickening. No evidence of outlet obstruction. Duodenum is normally positioned as is the ligament of Treitz. No small bowel wall thickening. No small bowel dilatation. The terminal ileum is normal. The appendix is normal. No gross colonic mass. No colonic wall thickening. Vascular/Lymphatic: Status post aortic endograft placement. There is no gastrohepatic or hepatoduodenal ligament lymphadenopathy. No retroperitoneal or mesenteric lymphadenopathy. No pelvic sidewall lymphadenopathy. Reproductive: Prostate gland is enlarged. Other: No intraperitoneal free fluid. Musculoskeletal: No worrisome lytic or sclerotic osseous abnormality. Degenerative changes noted L4-5. IMPRESSION: 1. Stable exam. No new or progressive findings in the chest, abdomen, or pelvis to suggest recurrent or metastatic disease. 2. Stable tiny right pulmonary nodules. Continued attention on follow-up recommended. 3. Ascending and distal transverse aorta measure up to 4 cm diameter. This could be reassessed at the time of follow-up. 4. Prostatomegaly. 5. Aortic Atherosclerosis (ICD10-I70.0) and Emphysema (ICD10-J43.9). Electronically Signed   By: EMisty StanleyM.D.   On: 10/21/2019 11:39   ECHO TEE  Result Date: 10/08/2019    TRANSESOPHOGEAL ECHO REPORT   Patient Name:   GJOMO FORANDSr. Date of Exam: 10/07/2019 Medical Rec #:  [id number]  Height:       75.5 in Accession #:    [id number]        Weight:       159.0 lb Date of Birth:  [birth year]         BSA:          2.000 m Patient Age:    46 years          BP:           144/57 mmHg Patient Gender: M                 HR:           68 bpm. Exam Location:  Inpatient Procedure: Transesophageal Echo, Cardiac Doppler and Color Doppler Indications:     Mitral regurgitation  History:         Patient has prior history of Echocardiogram examinations, most                  recent 08/18/2019. Mitral Valve Disease; Risk Factors:Former                  Smoker and Hypertension.  Sonographer:     Clayton Lefort  RDCS (AE) Referring Phys:  [id number] Dorothy Spark Diagnosing Phys: [name] MD PROCEDURE: After discussion of the risks and benefits of a TEE, an informed consent was obtained from the patient. The transesophogeal probe was passed without difficulty through the esophogus of the patient. Local oropharyngeal anesthetic was provided with Cetacaine. Sedation performed by performing physician. Patients was under conscious sedation during this procedure. Anesthetic administered: 163mg of Fentanyl, 819mof Versed. Image quality was adequate. The patient's vital signs; including heart rate, blood pressure, and oxygen saturation; remained stable throughout the procedure. The patient developed no complications during the procedure. IMPRESSIONS  1. 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 The jet is posteriorly directed, eccentric with reversal of pulmonary vein forward flow bilateraly. PISA radius 1.0 cm, ERO 0.45 cm2, regurgitation volume 58 ml. Posterior leaflet measures 1.0 cm. Flail gap 0.3 cm.  2. Left ventricular ejection fraction, by estimation, is 60 to 65%. The left ventricle has normal function. The left ventricle has no regional wall motion abnormalities.  3. Right ventricular systolic function is normal. The right ventricular size is normal. There is normal pulmonary artery systolic pressure.  4. Left atrial size was moderately dilated. No left atrial/left atrial appendage thrombus was detected. The LAA emptying velocity was 40 cm/s.  5. Right atrial size was mildly dilated.  6. The mitral valve is myxomatous. No evidence of mitral valve regurgitation. No evidence of mitral stenosis.  7. Tricuspid valve regurgitation is moderate.  8. The aortic valve is normal in structure. Aortic valve regurgitation is trivial. No aortic stenosis is present.  9. There is moderate dilatation at the level of the sinuses of Valsalva  measuring 48 mm. There is mild (Grade II) plaque. 10. The inferior vena cava is normal in size with greater than 50% respiratory variability, suggesting right atrial pressure of 3 mmHg. Conclusion(s)/Recommendation(s): Normal biventricular function without evidence of hemodynamically significant valvular heart disease. FINDINGS  Left Ventricle: Left ventricular ejection fraction, by estimation, is 60 to 65%. The left ventricle has normal function. The left ventricle has no regional wall motion abnormalities. The left ventricular internal cavity size was normal in size. There is  no left ventricular hypertrophy. Right Ventricle: The right ventricular size is normal.  No increase in right ventricular wall thickness. Right ventricular systolic function is normal. There is normal pulmonary artery systolic pressure. The tricuspid regurgitant velocity is 2.37 m/s, and  with an assumed right atrial pressure of 3 mmHg, the estimated right ventricular systolic pressure is 01.7 mmHg. Left Atrium: Left atrial size was moderately dilated. No left atrial/left atrial appendage thrombus was detected. The LAA emptying velocity was 40 cm/s. Right Atrium: Right atrial size was mildly dilated. Pericardium: There is no evidence of pericardial effusion. 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 There is severe thickening of the mitral valve leaflet(s). Normal mobility of the mitral valve leaflets.  No evidence of mitral valve regurgitation. No evidence of mitral valve stenosis. Tricuspid Valve: The tricuspid valve is normal in structure. Tricuspid valve regurgitation is moderate . No evidence of tricuspid stenosis. Aortic Valve: The aortic valve is normal in structure. Aortic valve regurgitation is trivial. No aortic stenosis is present. Pulmonic Valve: The pulmonic valve was normal in structure. Pulmonic valve regurgitation is not  visualized. No evidence of pulmonic stenosis. Aorta: The ascending aorta was not well visualized. There is moderate dilatation at the level of the sinuses of Valsalva measuring 48 mm. There is mild (Grade II) plaque. Venous: The inferior vena cava is normal in size with greater than 50% respiratory variability, suggesting right atrial pressure of 3 mmHg. IAS/Shunts: No atrial level shunt detected by color flow Doppler.  MR Peak grad:    117.5 mmHg  TRICUSPID VALVE MR Mean grad:    68.0 mmHg   TR Peak grad:   22.5 mmHg MR Vmax:         542.00 cm/s TR Vmax:        237.00 cm/s MR Vmean:        369.0 cm/s MR PISA:         6.28 cm MR PISA Eff ROA: 45 mm MR PISA Radius:  1.00 cm [name] MD Electronically signed by [name] MD Signature Date/Time: 10/07/2019/1:31:59 PM    Final (Updated)      ASSESSMENT/PLAN:  This is a very pleasant 64year old African American male with metastatic disease who was initially diagnosed with stage IB non-small cell lung cancer, adenocarcinoma of the left upper lobe. Molecular studies showed no actionable mutations. PD-L1 expression was negative. First diagnosed December 2017.  He was status post a wedge resection of the left upper lobe followed by 4 cycles of adjuvant systemic chemotherapy with cisplatin and Alimta. He tolerated treatment well except for fatigue.  He had been on observationuntilJune of 2018.  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   Theis currently undergoingsystemic chemotherapy with carboplatin, alimta, and keytruda. He is status post 31cycles. Starting from cycle #5, he has been on maintenance Alimta and Keytruda every 3 weeks.   The patient recently had a restaging CT scan performed. Dr. Julien Nordmann personally and independently reviewed the scan and discussed the results with the patient today.   The scan showed no evidence of disease progression.  Dr. Julien Nordmann recommends that the patient continue on the same treatment at the same dose.   The patient will proceed with cycle #32 today scheduled.  We will see the patient back for follow-up visit in 3 weeks for evaluation before starting cycle #33.  Patient will continue to follow with cardiology closely regarding his mitral valve regurgitation.  The patient was advised to call immediately if he has any concerning symptoms in the interval. The patient voices understanding of current disease status and treatment options and is in agreement with the current care plan. All questions were answered. The patient knows to call the clinic with any problems, questions or concerns. We can certainly see the patient much sooner if necessary    Orders Placed This Encounter  Procedures  . CBC with Differential (Cancer Center Only)    Standing Status:   Standing    Number of Occurrences:   18    Standing Expiration Date:   10/21/2020  . CMP ([facility] only)    Standing Status:   Standing    Number of Occurrences:   18    Standing Expiration Date:   10/21/2020  . TSH    Standing Status:   Standing    Number of Occurrences:   18    Standing Expiration Date:   10/21/2020     Ido Wollman L Radonna Bracher, PA-C 10/22/19  ADDENDUM: Hematology/Oncology Attending: I had a face-to-face encounter with the patient today.  I recommended his care plan.  This is a very pleasant 64 years old African-American male with metastatic non-small cell lung cancer, adenocarcinoma with positive KRAS G12C mutation.  The patient is status post induction systemic chemotherapy with carboplatin, Alimta and Keytruda status post 4 cycles.  He is currently on maintenance treatment with Alimta and Keytruda status post 27 more cycles.  He has been tolerating this treatment well with no concerning adverse effects. He is here today for evaluation with repeat CT scan of the chest,  abdomen and pelvis. I personally and independently reviewed the scans and discussed the results with the patient today. His scan showed no concerning findings for disease progression. I recommended for the patient to continue his current treatment with Alimta and Keytruda and he will proceed with cycle #28 today. I will see the patient back for follow-up visit in 3 weeks for evaluation before starting the next cycle of his treatment. If the patient develops any evidence for disease progression, he may benefit from treatment with the new Sotorasib, Lumakras which was recently approved for patient with K-ras mutation G12C. The patient was advised to call immediately if he has any concerning symptoms in the interval.  Disclaimer: This note was dictated with voice recognition software. Similar sounding words can inadvertently be transcribed and may be missed upon review. Eilleen Kempf, MD 10/22/19

## 2019-10-22 ENCOUNTER — Inpatient Hospital Stay: Payer: No Typology Code available for payment source

## 2019-10-22 ENCOUNTER — Other Ambulatory Visit: Payer: Self-pay

## 2019-10-22 ENCOUNTER — Inpatient Hospital Stay (HOSPITAL_BASED_OUTPATIENT_CLINIC_OR_DEPARTMENT_OTHER): Payer: No Typology Code available for payment source | Admitting: Physician Assistant

## 2019-10-22 ENCOUNTER — Inpatient Hospital Stay: Payer: No Typology Code available for payment source | Attending: Internal Medicine

## 2019-10-22 ENCOUNTER — Encounter: Payer: Self-pay | Admitting: Physician Assistant

## 2019-10-22 VITALS — BP 144/82 | HR 79 | Temp 97.5°F | Resp 18 | Ht 75.5 in | Wt 152.5 lb

## 2019-10-22 DIAGNOSIS — C3492 Malignant neoplasm of unspecified part of left bronchus or lung: Secondary | ICD-10-CM | POA: Diagnosis not present

## 2019-10-22 DIAGNOSIS — C3412 Malignant neoplasm of upper lobe, left bronchus or lung: Secondary | ICD-10-CM | POA: Diagnosis present

## 2019-10-22 DIAGNOSIS — Z5112 Encounter for antineoplastic immunotherapy: Secondary | ICD-10-CM

## 2019-10-22 DIAGNOSIS — Z79899 Other long term (current) drug therapy: Secondary | ICD-10-CM | POA: Insufficient documentation

## 2019-10-22 DIAGNOSIS — Z95828 Presence of other vascular implants and grafts: Secondary | ICD-10-CM

## 2019-10-22 DIAGNOSIS — Z5111 Encounter for antineoplastic chemotherapy: Secondary | ICD-10-CM

## 2019-10-22 DIAGNOSIS — C3432 Malignant neoplasm of lower lobe, left bronchus or lung: Secondary | ICD-10-CM

## 2019-10-22 LAB — CMP (CANCER CENTER ONLY)
ALT: 13 U/L (ref 0–44)
AST: 30 U/L (ref 15–41)
Albumin: 3.2 g/dL — ABNORMAL LOW (ref 3.5–5.0)
Alkaline Phosphatase: 95 U/L (ref 38–126)
Anion gap: 11 (ref 5–15)
BUN: 10 mg/dL (ref 8–23)
CO2: 21 mmol/L — ABNORMAL LOW (ref 22–32)
Calcium: 8.7 mg/dL — ABNORMAL LOW (ref 8.9–10.3)
Chloride: 103 mmol/L (ref 98–111)
Creatinine: 1.23 mg/dL (ref 0.61–1.24)
GFR, Est AFR Am: >60 mL/min (ref >60)
GFR, Estimated: >60 mL/min (ref >60)
Glucose, Bld: 96 mg/dL (ref 70–99)
Potassium: 3.9 mmol/L (ref 3.5–5.1)
Sodium: 135 mmol/L (ref 135–145)
Total Bilirubin: 0.4 mg/dL (ref 0.3–1.2)
Total Protein: 6.9 g/dL (ref 6.5–8.1)

## 2019-10-22 LAB — CBC WITH DIFFERENTIAL (CANCER CENTER ONLY)
Abs Immature Granulocytes: 0.06 10*3/uL (ref 0.00–0.07)
Basophils Absolute: 0 10*3/uL (ref 0.0–0.1)
Basophils Relative: 1 %
Eosinophils Absolute: 0.2 10*3/uL (ref 0.0–0.5)
Eosinophils Relative: 3 %
HCT: 39.6 % (ref 39.0–52.0)
Hemoglobin: 13 g/dL (ref 13.0–17.0)
Immature Granulocytes: 1 %
Lymphocytes Relative: 12 %
Lymphs Abs: 0.9 10*3/uL (ref 0.7–4.0)
MCH: 32.9 pg (ref 26.0–34.0)
MCHC: 32.8 g/dL (ref 30.0–36.0)
MCV: 100.3 fL — ABNORMAL HIGH (ref 80.0–100.0)
Monocytes Absolute: 1.4 10*3/uL — ABNORMAL HIGH (ref 0.1–1.0)
Monocytes Relative: 19 %
Neutro Abs: 4.8 10*3/uL (ref 1.7–7.7)
Neutrophils Relative %: 64 %
Platelet Count: 438 10*3/uL — ABNORMAL HIGH (ref 150–400)
RBC: 3.95 MIL/uL — ABNORMAL LOW (ref 4.22–5.81)
RDW: 15.3 % (ref 11.5–15.5)
WBC Count: 7.4 10*3/uL (ref 4.0–10.5)
nRBC: 0 % (ref 0.0–0.2)

## 2019-10-22 LAB — TSH: TSH: 1.793 u[IU]/mL (ref 0.320–4.118)

## 2019-10-22 MED ORDER — SODIUM CHLORIDE 0.9 % IV SOLN
200.0000 mg | Freq: Once | INTRAVENOUS | Status: AC
  Administered 2019-10-22: 200 mg via INTRAVENOUS
  Filled 2019-10-22: qty 8

## 2019-10-22 MED ORDER — SODIUM CHLORIDE 0.9 % IV SOLN
Freq: Once | INTRAVENOUS | Status: AC
  Filled 2019-10-22: qty 250

## 2019-10-22 MED ORDER — SODIUM CHLORIDE 0.9 % IV SOLN
10.0000 mg | Freq: Once | INTRAVENOUS | Status: AC
  Administered 2019-10-22: 10 mg via INTRAVENOUS
  Filled 2019-10-22: qty 10

## 2019-10-22 MED ORDER — ONDANSETRON HCL 4 MG/2ML IJ SOLN
INTRAMUSCULAR | Status: AC
  Filled 2019-10-22: qty 4

## 2019-10-22 MED ORDER — ONDANSETRON HCL 4 MG/2ML IJ SOLN
8.0000 mg | Freq: Once | INTRAMUSCULAR | Status: AC
  Administered 2019-10-22: 8 mg via INTRAVENOUS

## 2019-10-22 MED ORDER — HEPARIN SOD (PORK) LOCK FLUSH 100 UNIT/ML IV SOLN
500.0000 [IU] | Freq: Once | INTRAVENOUS | Status: AC | PRN
  Administered 2019-10-22: 500 [IU]
  Filled 2019-10-22: qty 5

## 2019-10-22 MED ORDER — SODIUM CHLORIDE 0.9% FLUSH
10.0000 mL | Freq: Once | INTRAVENOUS | Status: AC
  Administered 2019-10-22: 10 mL
  Filled 2019-10-22: qty 10

## 2019-10-22 MED ORDER — SODIUM CHLORIDE 0.9 % IV SOLN
500.0000 mg/m2 | Freq: Once | INTRAVENOUS | Status: AC
  Administered 2019-10-22: 1000 mg via INTRAVENOUS
  Filled 2019-10-22: qty 40

## 2019-10-22 MED ORDER — SODIUM CHLORIDE 0.9% FLUSH
10.0000 mL | INTRAVENOUS | Status: DC | PRN
  Administered 2019-10-22: 10 mL
  Filled 2019-10-22: qty 10

## 2019-10-22 NOTE — Patient Instructions (Signed)

## 2019-10-22 NOTE — Patient Instructions (Signed)
[facility] Discharge Instructions for Patients Receiving Chemotherapy  Today you received the following immunotherapy agent: Pembrolizumab (Keytruda) and chemotherapy agent: Pemetrexed (Alimta)  To help prevent nausea and vomiting after your treatment, we encourage you to take your nausea medication as directed by your MD.   If you develop nausea and vomiting that is not controlled by your nausea medication, call the clinic.   BELOW ARE SYMPTOMS THAT SHOULD BE REPORTED IMMEDIATELY:  *FEVER GREATER THAN 100.5 F  *CHILLS WITH OR WITHOUT FEVER  NAUSEA AND VOMITING THAT IS NOT CONTROLLED WITH YOUR NAUSEA MEDICATION  *UNUSUAL SHORTNESS OF BREATH  *UNUSUAL BRUISING OR BLEEDING  TENDERNESS IN MOUTH AND THROAT WITH OR WITHOUT PRESENCE OF ULCERS  *URINARY PROBLEMS  *BOWEL PROBLEMS  UNUSUAL RASH Items with * indicate a potential emergency and should be followed up as soon as possible.  Feel free to call the clinic should you have any questions or concerns. The clinic phone number is [phone].  Please show the [facility] at check-in to the Emergency Department and triage nurse.

## 2019-10-23 ENCOUNTER — Telehealth: Payer: Self-pay | Admitting: Physician Assistant

## 2019-10-23 NOTE — Telephone Encounter (Signed)
Scheduled per los. Called, not able to leave msg. Mailed printout  

## 2019-11-09 ENCOUNTER — Encounter: Payer: No Typology Code available for payment source | Admitting: Thoracic Surgery (Cardiothoracic Vascular Surgery)

## 2019-11-11 NOTE — Progress Notes (Signed)
[name] OFFICE PROGRESS NOTE  [name], [address] [place] [address]  DIAGNOSIS: Metastatic non-small cell lung cancer, adenocarcinoma initially diagnosed as stage IB (T2a, N0, M0) non-small cell lung cancer, adenocarcinoma diagnosed in December 2017. The patient has evidence for disease recurrence in July 2019.  Biomarker Findings Tumor Mutational Burden - TMB-Intermediate (16 Muts/Mb) Microsatellite status - MS-Stable Genomic Findings For a complete list of the genes assayed, please refer to the Appendix. KIT amplification KRAS U04V SMAD4 splice site 4098-1X>B JY78 I232F 7 Disease relevant genes with no reportable alterations: EGFR, ALK, BRAF, MET, RET, ERBB2, ROS1   PDL 1 expression is 0%  PRIOR THERAPY: 1) status post left lower lobectomy as well as wedge resection of the left upper lobe on 05/11/2016. 2) Adjuvant systemic chemotherapy with cisplatin 75 MG/M2 and Alimta 500 MG/M2 every 3 weeks. First dose 07/03/2016. Status post 4 cycles  CURRENT THERAPY: Systemic chemotherapy with carboplatin for AUC of 5, Alimta 500 mg/M2 and Keytruda 200 mg IV every 3 weeks. Status post 32cycles. Starting from cycle #5 the patient will be treated with maintenance Alimta and Nat Math (pembrolizumab) every 3 weeks  INTERVAL HISTORY: William Kales [name] 64 y.o. male returns to the clinic for a follow up visit. The patient is feeling well today without any concerning complaints. The patient continues to tolerate treatment with Alimta and Keytruda well without any adverse side effects. Denies any fever, chills, night sweats, or weight loss. Denies any chest pain, shortness of breath, cough, or hemoptysis. Denies any nausea, vomiting, diarrhea, or constipation. Denies any headache or visual changes. Denies any rashes or skin changes. The patient is here today for evaluation prior to starting cycle # 33  MEDICAL HISTORY: Past Medical History:  Diagnosis Date    AAA (abdominal aortic aneurysm) ([place])    AAA (abdominal aortic aneurysm) without rupture ([facility]) 02/04/2014   Cancer ([place])    lung   Encounter for antineoplastic chemotherapy 06/06/2016   History of hepatitis B    HNP (herniated nucleus pulposus), lumbar    L4 with radiculopathy   Hypertension    Lung cancer ([facility]) 05/18/2016   Malignant neoplasm of lower lobe of left lung ([facility]) 04/05/2016   Mass of left lung    Mitral insufficiency 04/06/2016   This patient will eventually need MV repair if his prognosis is good from oncology standpoint. However, his lung cancer therapy  is the priority right now. His cardiac condition won't preclude possible lung surgery.  Once his lung cancer is under control he will need a TEE to further evaluate his mitral valve anatomy and MR severity, and also have ischemic workup as tere is evidence on calcificati   Renal insufficiency 10/09/2016   S/P partial lobectomy of lung 05/11/2016   Varicose veins of legs     ALLERGIES:  is allergic to tramadol.  MEDICATIONS:  Current Outpatient Medications  Medication Sig Dispense Refill   carvedilol (COREG) 6.25 MG tablet Take 1 tablet (6.25 mg total) by mouth 2 (two) times daily. 295 tablet 3   folic acid (FOLVITE) 1 MG tablet Take 1 tablet (1 mg total) by mouth daily. 90 tablet 1   lidocaine-prilocaine (EMLA) cream Apply 1 application topically as needed. Beginning may June 1st   apply a small amount of emla cream  to a cotton ball and apply over port site 1-2  Hours prior to chemotherapy . Do not rub in cream . Cover with plastic wrap. (Patient taking  differently: Apply 1 application topically as needed [facility]). Beginning may June 1st   apply a small amount of emla cream  to a cotton ball and apply over port site 1-2  Hours prior to chemotherapy . Do not rub in cream . Cover with plastic wrap.) 30 g 0   lisinopril (ZESTRIL) 2.5 MG tablet TAKE 1 TABLET BY MOUTH EVERY DAY (Patient taking differently: Take  2.5 mg by mouth daily. ) 90 tablet 1   No current facility-administered medications for this visit.    SURGICAL HISTORY:  Past Surgical History:  Procedure Laterality Date   ABDOMINAL AORTIC ENDOVASCULAR STENT GRAFT N/A 03/12/2016   Procedure: ABDOMINAL AORTIC ENDOVASCULAR STENT GRAFT;  Surgeon: Conrad Dateland, MD;  Location: [facility];  Service: Vascular;  Laterality: N/A;   COLONOSCOPY     INGUINAL HERNIA REPAIR Left 1975   Left inguinal hernia   INGUINAL HERNIA REPAIR Right    IR IMAGING GUIDED PORT INSERTION  09/04/2019   LOBECTOMY Left 05/11/2016   Procedure: LEFT LOWER LOBE LOBECTOMY AND LEFT UPPER LOBE  RESECTION AND PLACEMENT OF ON-Q;  Surgeon: [name], MD;  Location: [facility];  Service: Thoracic;  Laterality: Left;   LUMBAR LAMINECTOMY  October 22, 2012   LYMPH NODE DISSECTION Left 05/11/2016   Procedure: LYMPH NODE DISSECTION;  Surgeon: [name], MD;  Location: [facility];  Service: Thoracic;  Laterality: Left;   STAPLING OF BLEBS Left 05/11/2016   Procedure: STAPLING OF APICAL BLEB;  Surgeon: [name], MD;  Location: [facility];  Service: Thoracic;  Laterality: Left;   TEE WITHOUT CARDIOVERSION N/A 10/07/2019   Procedure: TRANSESOPHAGEAL ECHOCARDIOGRAM (TEE);  Surgeon: Dorothy Spark, MD;  Location[facility] ENDOSCOPY;  Service: Cardiovascular;  Laterality: N/A;   VIDEO ASSISTED THORACOSCOPY Left 05/18/2016   Procedure: VIDEO ASSISTED THORACOSCOPY WITH REMOVAL OF LEFT APICAL BLEB;  Surgeon: [name], MD;  Location: [facility];  Service: Thoracic;  Laterality: Left;   VIDEO ASSISTED THORACOSCOPY (VATS)/WEDGE RESECTION Left 05/11/2016   Procedure: LEFT VIDEO ASSISTED THORACOSCOPY (VATS);  Surgeon: [name], MD;  Location: [facility];  Service: Thoracic;  Laterality: Left;   VIDEO BRONCHOSCOPY N/A 05/11/2016   Procedure: VIDEO BRONCHOSCOPY, LEFT LUNG;  Surgeon: [name], MD;  Location: [facility];  Service: Thoracic;  Laterality: N/A;   VIDEO BRONCHOSCOPY  N/A 05/18/2016   Procedure: VIDEO BRONCHOSCOPY WITH BRONCHIAL WASHING;  Surgeon: [name], MD;  Location: [facility];  Service: Thoracic;  Laterality: N/A;    REVIEW OF SYSTEMS:   Review of Systems  Constitutional: Negative for appetite change, chills, fatigue, fever and unexpected weight change.  HENT: Negative for mouth sores, nosebleeds, sore throat and trouble swallowing.   Eyes: Negative for eye problems and icterus.  Respiratory: Negative for cough, hemoptysis, shortness of breath and wheezing.   Cardiovascular: Negative for chest pain and leg swelling.  Gastrointestinal: Negative for abdominal pain, constipation, diarrhea, nausea and vomiting.  Genitourinary: Negative for bladder incontinence, difficulty urinating, dysuria, frequency and hematuria.   Musculoskeletal: Negative for back pain, gait problem, neck pain and neck stiffness.  Skin: Negative for itching and rash.  Neurological: Negative for dizziness, extremity weakness, gait problem, headaches, light-headedness and seizures.  Hematological: Negative for adenopathy. Does not bruise/bleed easily.  Psychiatric/Behavioral: Negative for confusion, depression and sleep disturbance. The patient is not nervous/anxious.     PHYSICAL EXAMINATION:  Blood pressure (!) 142/78, pulse 68, temperature 97.7 F (36.5 C), temperature source Temporal, resp. rate 18, height  6' 3.5" (1.918 m), weight 156 lb 6.4 oz (70.9 kg), SpO2 97 %.  ECOG PERFORMANCE STATUS: 1 - Symptomatic but completely ambulatory  Physical Exam  Constitutional: Oriented to person, place, and time and well-developed, well-nourished, and in no distress.  HENT:  Head: Normocephalic and atraumatic.  Mouth/Throat: Oropharynx is clear and moist. No oropharyngeal exudate.  Eyes: Conjunctivae are normal. Right eye exhibits no discharge. Left eye exhibits no discharge. No scleral icterus.  Neck: Normal range of motion. Neck supple.  Cardiovascular: Systolic murmur  auscultated in his posterior left thorax.Normal rate, regular rhythm and intact distal pulses.   Pulmonary/Chest: Effort normal and breath sounds normal. No respiratory distress. No wheezes. No rales.  Abdominal: Soft. Bowel sounds are normal. Exhibits no distension and no mass. There is no tenderness.  Musculoskeletal: Normal range of motion. Exhibits no edema.  Lymphadenopathy:    No cervical adenopathy.  Neurological: Alert and oriented to person, place, and time. Exhibits normal muscle tone. Gait normal. Coordination normal.  Skin: Skin is warm and dry. No rash noted. Not diaphoretic. No erythema. No pallor.  Psychiatric: Mood, memory and judgment normal.  Vitals reviewed.  LABORATORY DATA: Lab Results  Component Value Date   WBC 6.4 11/12/2019   HGB 12.4 (L) 11/12/2019   HCT 37.4 (L) 11/12/2019   MCV 100.8 (H) 11/12/2019   PLT 446 (H) 11/12/2019      Chemistry      Component Value Date/Time   NA 135 11/12/2019 0835   NA 132 (L) 10/05/2019 0830   NA 138 01/11/2017 1343   K 4.3 11/12/2019 0835   K 4.7 01/11/2017 1343   CL 105 11/12/2019 0835   CO2 21 (L) 11/12/2019 0835   CO2 23 01/11/2017 1343   BUN 11 11/12/2019 0835   BUN 14 10/05/2019 0830   BUN 20.2 01/11/2017 1343   CREATININE 1.34 (H) 11/12/2019 0835   CREATININE 1.6 (H) 01/11/2017 1343      Component Value Date/Time   CALCIUM 9.4 11/12/2019 0835   CALCIUM 9.3 01/11/2017 1343   ALKPHOS 84 11/12/2019 0835   ALKPHOS 89 01/11/2017 1343   AST 28 11/12/2019 0835   AST 41 (H) 01/11/2017 1343   ALT 15 11/12/2019 0835   ALT 27 01/11/2017 1343   BILITOT 0.4 11/12/2019 0835   BILITOT 0.49 01/11/2017 1343       RADIOGRAPHIC STUDIES:  CT Chest W Contrast  Result Date: 10/21/2019 CLINICAL DATA:  Primary Cancer Type: Lung Imaging Indication: Assess response to therapy Interval therapy since last imaging? Yes Initial Cancer Diagnosis Date: 03/29/2016; established by: Biopsy-proven Detailed Pathology: Stage Ib  non-small cell lung cancer, adenocarcinoma. Primary Tumor location: Left lower lobe. Recurrence? Yes; Date(s) of recurrence: 12/02/2017; Established by: Biopsy-proven; metastasis to T4. Surgeries: Left lower lobectomy 05/11/2016. Chemotherapy: Yes; Ongoing? Yes; Most recent administration: 10/01/2019 Radiation therapy?  No Immunotherapy?  Yes; Type: Keytruda; Ongoing? Yes EXAM: CT CHEST, ABDOMEN, AND PELVIS WITH CONTRAST TECHNIQUE: Multidetector CT imaging of the chest, abdomen and pelvis was performed following the standard protocol during bolus administration of intravenous contrast. CONTRAST:  114m OMNIPAQUE IOHEXOL 300 MG/ML  SOLN COMPARISON:  Most recent CT chest, abdomen and pelvis 07/28/2019. 11/12/2017 PET-CT. FINDINGS: CT CHEST FINDINGS Cardiovascular: The heart size is normal. No substantial pericardial effusion. Ascending thoracic aorta measures 4 cm diameter. Right Port-A-Cath tip is positioned in the distal SVC. Distal transverse aorta measures 4 cm diameter. Mediastinum/Nodes: No mediastinal lymphadenopathy. There is no hilar lymphadenopathy. The esophagus has normal imaging features. There  is no axillary lymphadenopathy. Lungs/Pleura: Bilateral calcified pleural plaques again noted. Centrilobular and paraseptal emphysema evident. 4 mm anterior right upper lobe nodule on 76/4 is stable. 5 mm nodule posterior right costophrenic sulcus on 145/4 is unchanged. Surgical changes again noted in the left lung. Tiny chronic pleural effusions versus pleural thickening noted inferior hemithorax bilaterally. Musculoskeletal: No worrisome lytic or sclerotic osseous abnormality. CT ABDOMEN PELVIS FINDINGS Hepatobiliary: Stable 9 mm hypoattenuating lesion in the medial segment left liver along the falciform ligament, potentially focal fatty deposition. Other scattered tiny hypodensities in the liver parenchyma are stable and too small to characterize, but likely benign. Gallbladder is nondistended. No intrahepatic or  extrahepatic biliary dilation. Pancreas: No focal mass lesion. No dilatation of the main duct. No intraparenchymal cyst. No peripancreatic edema. Spleen:  No splenomegaly. No focal mass lesion. Adrenals/Urinary Tract: No adrenal nodule or mass. Right kidney unremarkable. Tiny subcapsular low-density lesions in the left kidney are too small to characterize, but stable in the interval. No evidence for hydroureter. The urinary bladder appears normal for the degree of distention. Stomach/Bowel: Stomach is unremarkable. No gastric wall thickening. No evidence of outlet obstruction. Duodenum is normally positioned as is the ligament of Treitz. No small bowel wall thickening. No small bowel dilatation. The terminal ileum is normal. The appendix is normal. No gross colonic mass. No colonic wall thickening. Vascular/Lymphatic: Status post aortic endograft placement. There is no gastrohepatic or hepatoduodenal ligament lymphadenopathy. No retroperitoneal or mesenteric lymphadenopathy. No pelvic sidewall lymphadenopathy. Reproductive: Prostate gland is enlarged. Other: No intraperitoneal free fluid. Musculoskeletal: No worrisome lytic or sclerotic osseous abnormality. Degenerative changes noted L4-5. IMPRESSION: 1. Stable exam. No new or progressive findings in the chest, abdomen, or pelvis to suggest recurrent or metastatic disease. 2. Stable tiny right pulmonary nodules. Continued attention on follow-up recommended. 3. Ascending and distal transverse aorta measure up to 4 cm diameter. This could be reassessed at the time of follow-up. 4. Prostatomegaly. 5. Aortic Atherosclerosis (ICD10-I70.0) and Emphysema (ICD10-J43.9). Electronically Signed   By: Misty Stanley M.D.   On: 10/21/2019 11:39   CT Abdomen Pelvis W Contrast  Result Date: 10/21/2019 CLINICAL DATA:  Primary Cancer Type: Lung Imaging Indication: Assess response to therapy Interval therapy since last imaging? Yes Initial Cancer Diagnosis Date: 03/29/2016;  established by: Biopsy-proven Detailed Pathology: Stage Ib non-small cell lung cancer, adenocarcinoma. Primary Tumor location: Left lower lobe. Recurrence? Yes; Date(s) of recurrence: 12/02/2017; Established by: Biopsy-proven; metastasis to T4. Surgeries: Left lower lobectomy 05/11/2016. Chemotherapy: Yes; Ongoing? Yes; Most recent administration: 10/01/2019 Radiation therapy?  No Immunotherapy?  Yes; Type: Keytruda; Ongoing? Yes EXAM: CT CHEST, ABDOMEN, AND PELVIS WITH CONTRAST TECHNIQUE: Multidetector CT imaging of the chest, abdomen and pelvis was performed following the standard protocol during bolus administration of intravenous contrast. CONTRAST:  156m OMNIPAQUE IOHEXOL 300 MG/ML  SOLN COMPARISON:  Most recent CT chest, abdomen and pelvis 07/28/2019. 11/12/2017 PET-CT. FINDINGS: CT CHEST FINDINGS Cardiovascular: The heart size is normal. No substantial pericardial effusion. Ascending thoracic aorta measures 4 cm diameter. Right Port-A-Cath tip is positioned in the distal SVC. Distal transverse aorta measures 4 cm diameter. Mediastinum/Nodes: No mediastinal lymphadenopathy. There is no hilar lymphadenopathy. The esophagus has normal imaging features. There is no axillary lymphadenopathy. Lungs/Pleura: Bilateral calcified pleural plaques again noted. Centrilobular and paraseptal emphysema evident. 4 mm anterior right upper lobe nodule on 76/4 is stable. 5 mm nodule posterior right costophrenic sulcus on 145/4 is unchanged. Surgical changes again noted in the left lung. Tiny chronic pleural effusions versus pleural  thickening noted inferior hemithorax bilaterally. Musculoskeletal: No worrisome lytic or sclerotic osseous abnormality. CT ABDOMEN PELVIS FINDINGS Hepatobiliary: Stable 9 mm hypoattenuating lesion in the medial segment left liver along the falciform ligament, potentially focal fatty deposition. Other scattered tiny hypodensities in the liver parenchyma are stable and too small to characterize, but  likely benign. Gallbladder is nondistended. No intrahepatic or extrahepatic biliary dilation. Pancreas: No focal mass lesion. No dilatation of the main duct. No intraparenchymal cyst. No peripancreatic edema. Spleen:  No splenomegaly. No focal mass lesion. Adrenals/Urinary Tract: No adrenal nodule or mass. Right kidney unremarkable. Tiny subcapsular low-density lesions in the left kidney are too small to characterize, but stable in the interval. No evidence for hydroureter. The urinary bladder appears normal for the degree of distention. Stomach/Bowel: Stomach is unremarkable. No gastric wall thickening. No evidence of outlet obstruction. Duodenum is normally positioned as is the ligament of Treitz. No small bowel wall thickening. No small bowel dilatation. The terminal ileum is normal. The appendix is normal. No gross colonic mass. No colonic wall thickening. Vascular/Lymphatic: Status post aortic endograft placement. There is no gastrohepatic or hepatoduodenal ligament lymphadenopathy. No retroperitoneal or mesenteric lymphadenopathy. No pelvic sidewall lymphadenopathy. Reproductive: Prostate gland is enlarged. Other: No intraperitoneal free fluid. Musculoskeletal: No worrisome lytic or sclerotic osseous abnormality. Degenerative changes noted L4-5. IMPRESSION: 1. Stable exam. No new or progressive findings in the chest, abdomen, or pelvis to suggest recurrent or metastatic disease. 2. Stable tiny right pulmonary nodules. Continued attention on follow-up recommended. 3. Ascending and distal transverse aorta measure up to 4 cm diameter. This could be reassessed at the time of follow-up. 4. Prostatomegaly. 5. Aortic Atherosclerosis (ICD10-I70.0) and Emphysema (ICD10-J43.9). Electronically Signed   By: Misty Stanley M.D.   On: 10/21/2019 11:39     ASSESSMENT/PLAN:  This is a very pleasant 64year old [place] American male with metastatic disease who was initially diagnosed with stage IB non-small cell lung  cancer, adenocarcinoma of the left upper lobe. Molecular studies showed no actionable mutations. PD-L1 expression was negative. First diagnosed December 2017.  He was status post a wedge resection of the left upper lobe followed by 4 cycles of adjuvant systemic chemotherapy with cisplatin and Alimta. He tolerated treatment well except for fatigue.  He had been on observationuntilJune of 2018.  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   Theis currently undergoingsystemic chemotherapy with carboplatin, alimta, and keytruda. He is status post 32cycles. Starting from cycle #5, he has been on maintenance Alimta and Keytruda every 3 weeks.   Labs were reviewed. Recommend that the patient will proceed with cycle #33 today scheduled.  We will see the patient back for follow-up visit in 3 weeks for evaluation before starting cycle #34.  Patient will continue to follow with cardiology closely regarding his mitral valve regurgitation.  The patient's creatinine is slightly elevated today. He was encouraged to increase his oral hydration.   The patient was advised to call immediately if he has any concerning symptoms in the interval. The patient voices understanding of current disease status and treatment options and is in agreement with the current care plan. All questions were answered. The patient knows to call the clinic with any problems, questions or concerns. We can certainly see the patient much sooner if necessary       No orders of the defined types were placed in  this encounter.    William Cropper L Othon Guardia, PA-C 11/12/19

## 2019-11-12 ENCOUNTER — Inpatient Hospital Stay: Payer: No Typology Code available for payment source

## 2019-11-12 ENCOUNTER — Inpatient Hospital Stay (HOSPITAL_BASED_OUTPATIENT_CLINIC_OR_DEPARTMENT_OTHER): Payer: No Typology Code available for payment source | Admitting: Physician Assistant

## 2019-11-12 ENCOUNTER — Other Ambulatory Visit: Payer: Self-pay

## 2019-11-12 VITALS — BP 142/78 | HR 68 | Temp 97.7°F | Resp 18 | Ht 75.5 in | Wt 156.4 lb

## 2019-11-12 DIAGNOSIS — Z5112 Encounter for antineoplastic immunotherapy: Secondary | ICD-10-CM | POA: Diagnosis not present

## 2019-11-12 DIAGNOSIS — C3492 Malignant neoplasm of unspecified part of left bronchus or lung: Secondary | ICD-10-CM

## 2019-11-12 DIAGNOSIS — C3432 Malignant neoplasm of lower lobe, left bronchus or lung: Secondary | ICD-10-CM

## 2019-11-12 DIAGNOSIS — Z5111 Encounter for antineoplastic chemotherapy: Secondary | ICD-10-CM

## 2019-11-12 DIAGNOSIS — Z95828 Presence of other vascular implants and grafts: Secondary | ICD-10-CM

## 2019-11-12 LAB — CMP (CANCER CENTER ONLY)
ALT: 15 U/L (ref 0–44)
AST: 28 U/L (ref 15–41)
Albumin: 3.1 g/dL — ABNORMAL LOW (ref 3.5–5.0)
Alkaline Phosphatase: 84 U/L (ref 38–126)
Anion gap: 9 (ref 5–15)
BUN: 11 mg/dL (ref 8–23)
CO2: 21 mmol/L — ABNORMAL LOW (ref 22–32)
Calcium: 9.4 mg/dL (ref 8.9–10.3)
Chloride: 105 mmol/L (ref 98–111)
Creatinine: 1.34 mg/dL — ABNORMAL HIGH (ref 0.61–1.24)
GFR, Est AFR Am: >60 mL/min (ref >60)
GFR, Estimated: 55 mL/min — ABNORMAL LOW (ref >60)
Glucose, Bld: 92 mg/dL (ref 70–99)
Potassium: 4.3 mmol/L (ref 3.5–5.1)
Sodium: 135 mmol/L (ref 135–145)
Total Bilirubin: 0.4 mg/dL (ref 0.3–1.2)
Total Protein: 6.7 g/dL (ref 6.5–8.1)

## 2019-11-12 LAB — CBC WITH DIFFERENTIAL (CANCER CENTER ONLY)
Abs Immature Granulocytes: 0.04 10*3/uL (ref 0.00–0.07)
Basophils Absolute: 0.1 10*3/uL (ref 0.0–0.1)
Basophils Relative: 1 %
Eosinophils Absolute: 0.2 10*3/uL (ref 0.0–0.5)
Eosinophils Relative: 4 %
HCT: 37.4 % — ABNORMAL LOW (ref 39.0–52.0)
Hemoglobin: 12.4 g/dL — ABNORMAL LOW (ref 13.0–17.0)
Immature Granulocytes: 1 %
Lymphocytes Relative: 14 %
Lymphs Abs: 0.9 10*3/uL (ref 0.7–4.0)
MCH: 33.4 pg (ref 26.0–34.0)
MCHC: 33.2 g/dL (ref 30.0–36.0)
MCV: 100.8 fL — ABNORMAL HIGH (ref 80.0–100.0)
Monocytes Absolute: 1.2 10*3/uL — ABNORMAL HIGH (ref 0.1–1.0)
Monocytes Relative: 18 %
Neutro Abs: 4 10*3/uL (ref 1.7–7.7)
Neutrophils Relative %: 62 %
Platelet Count: 446 10*3/uL — ABNORMAL HIGH (ref 150–400)
RBC: 3.71 MIL/uL — ABNORMAL LOW (ref 4.22–5.81)
RDW: 15.4 % (ref 11.5–15.5)
WBC Count: 6.4 10*3/uL (ref 4.0–10.5)
nRBC: 0 % (ref 0.0–0.2)

## 2019-11-12 LAB — TSH: TSH: 1.559 u[IU]/mL (ref 0.320–4.118)

## 2019-11-12 MED ORDER — FOLIC ACID 1 MG PO TABS: 1.0000 mg | ORAL_TABLET | Freq: Every day | ORAL | 1 refills | Status: DC

## 2019-11-12 MED ORDER — ONDANSETRON HCL 4 MG/2ML IJ SOLN
INTRAMUSCULAR | Status: AC
  Filled 2019-11-12: qty 4

## 2019-11-12 MED ORDER — SODIUM CHLORIDE 0.9% FLUSH
10.0000 mL | INTRAVENOUS | Status: DC | PRN
  Administered 2019-11-12: 10 mL
  Filled 2019-11-12: qty 10

## 2019-11-12 MED ORDER — SODIUM CHLORIDE 0.9 % IV SOLN
500.0000 mg/m2 | Freq: Once | INTRAVENOUS | Status: AC
  Administered 2019-11-12: 1000 mg via INTRAVENOUS
  Filled 2019-11-12: qty 40

## 2019-11-12 MED ORDER — HEPARIN SOD (PORK) LOCK FLUSH 100 UNIT/ML IV SOLN
500.0000 [IU] | Freq: Once | INTRAVENOUS | Status: AC | PRN
  Administered 2019-11-12: 500 [IU]
  Filled 2019-11-12: qty 5

## 2019-11-12 MED ORDER — SODIUM CHLORIDE 0.9% FLUSH
10.0000 mL | Freq: Once | INTRAVENOUS | Status: AC
  Administered 2019-11-12: 10 mL
  Filled 2019-11-12: qty 10

## 2019-11-12 MED ORDER — SODIUM CHLORIDE 0.9 % IV SOLN
200.0000 mg | Freq: Once | INTRAVENOUS | Status: AC
  Administered 2019-11-12: 200 mg via INTRAVENOUS
  Filled 2019-11-12: qty 8

## 2019-11-12 MED ORDER — SODIUM CHLORIDE 0.9 % IV SOLN
Freq: Once | INTRAVENOUS | Status: AC
  Filled 2019-11-12: qty 250

## 2019-11-12 MED ORDER — SODIUM CHLORIDE 0.9 % IV SOLN
10.0000 mg | Freq: Once | INTRAVENOUS | Status: AC
  Administered 2019-11-12: 10 mg via INTRAVENOUS
  Filled 2019-11-12: qty 10

## 2019-11-12 MED ORDER — ONDANSETRON HCL 4 MG/2ML IJ SOLN
8.0000 mg | Freq: Once | INTRAMUSCULAR | Status: AC
  Administered 2019-11-12: 8 mg via INTRAVENOUS

## 2019-11-12 NOTE — Patient Instructions (Signed)
[facility] Discharge Instructions for Patients Receiving Chemotherapy  Today you received the following immunotherapy agent: Pembrolizumab (Keytruda) and chemotherapy agent: Pemetrexed (Alimta)  To help prevent nausea and vomiting after your treatment, we encourage you to take your nausea medication as directed by your MD.   If you develop nausea and vomiting that is not controlled by your nausea medication, call the clinic.   BELOW ARE SYMPTOMS THAT SHOULD BE REPORTED IMMEDIATELY:  *FEVER GREATER THAN 100.5 F  *CHILLS WITH OR WITHOUT FEVER  NAUSEA AND VOMITING THAT IS NOT CONTROLLED WITH YOUR NAUSEA MEDICATION  *UNUSUAL SHORTNESS OF BREATH  *UNUSUAL BRUISING OR BLEEDING  TENDERNESS IN MOUTH AND THROAT WITH OR WITHOUT PRESENCE OF ULCERS  *URINARY PROBLEMS  *BOWEL PROBLEMS  UNUSUAL RASH Items with * indicate a potential emergency and should be followed up as soon as possible.  Feel free to call the clinic should you have any questions or concerns. The clinic phone number is [phone].  Please show the [facility] at check-in to the Emergency Department and triage nurse.

## 2019-11-13 ENCOUNTER — Telehealth: Payer: Self-pay | Admitting: Physician Assistant

## 2019-11-13 NOTE — Telephone Encounter (Signed)
Scheduled per los. Called and left msg. Mailed printout  °

## 2019-12-01 NOTE — Progress Notes (Signed)
[facility] OFFICE PROGRESS NOTE  [name], [address] [place] [address]  DIAGNOSIS: Metastatic non-small cell lung cancer, adenocarcinoma initially diagnosed as stage IB (T2a, N0, M0) non-small cell lung cancer, adenocarcinoma diagnosed in December 2017. The patient has evidence for disease recurrence in July 2019.  Biomarker Findings Tumor Mutational Burden - TMB-Intermediate (16 Muts/Mb) Microsatellite status - MS-Stable Genomic Findings For a complete list of the genes assayed, please refer to the Appendix. KIT amplification KRAS N23F SMAD4 splice site 5732-2G>U RK27 I232F 7 Disease relevant genes with no reportable alterations: EGFR, ALK, BRAF, MET, RET, ERBB2, ROS1   PDL 1 expression is 0%  PRIOR THERAPY: 1) status post left lower lobectomy as well as wedge resection of the left upper lobe on 05/11/2016. 2) Adjuvant systemic chemotherapy with cisplatin 75 MG/M2 and Alimta 500 MG/M2 every 3 weeks. First dose 07/03/2016. Status post 4 cycles  CURRENT THERAPY: Systemic chemotherapy with carboplatin for AUC of 5, Alimta 500 mg/M2 and Keytruda 200 mg IV every 3 weeks. Status post33cycles. Starting from cycle #5 the patient will be treated with maintenance Alimta and Nat Math (pembrolizumab) every 3 weeks  INTERVAL HISTORY: [name] [name] 64 y.o. male returns to the clinic for a follow up visit. The patient is feeling well today without any concerning complaints. The patient continues to tolerate treatment with Alimta and Keytruda well without any adverse side effects. Denies any fever, chills, night sweats, or weight loss. Denies any chest pain, shortness of breath, cough, or hemoptysis. Denies any nausea, vomiting, diarrhea, or constipation. Denies any headache or visual changes. Denies any rashes or skin changes. The patient is here today for evaluation prior to starting cycle # 34   MEDICAL HISTORY: Past Medical History:  Diagnosis Date   . AAA (abdominal aortic aneurysm) ([place])   . AAA (abdominal aortic aneurysm) without rupture ([place]) 02/04/2014  . Cancer ([place])    lung  . Encounter for antineoplastic chemotherapy 06/06/2016  . History of hepatitis B   . HNP (herniated nucleus pulposus), lumbar    L4 with radiculopathy  . Hypertension   . Lung cancer ([place]) 05/18/2016  . Malignant neoplasm of lower lobe of left lung ([place]) 04/05/2016  . Mass of left lung   . Mitral insufficiency 04/06/2016   This patient will eventually need MV repair if his prognosis is good from oncology standpoint. However, his lung cancer therapy  is the priority right now. His cardiac condition won't preclude possible lung surgery.  Once his lung cancer is under control he will need a TEE to further evaluate his mitral valve anatomy and MR severity, and also have ischemic workup as tere is evidence on calcificati  . Renal insufficiency 10/09/2016  . S/P partial lobectomy of lung 05/11/2016  . Varicose veins of legs     ALLERGIES:  is allergic to tramadol.  MEDICATIONS:  Current Outpatient Medications  Medication Sig Dispense Refill  . carvedilol (COREG) 6.25 MG tablet Take 1 tablet (6.25 mg total) by mouth 2 (two) times daily. 062 tablet 3  . folic acid (FOLVITE) 1 MG tablet Take 1 tablet (1 mg total) by mouth daily. 90 tablet 1  . lidocaine-prilocaine (EMLA) cream Apply 1 application topically as needed. Beginning may June 1st   apply a small amount of emla cream  to a cotton ball and apply over port site 1-2  Hours prior to chemotherapy . Do not rub in cream . Cover with plastic wrap. (Patient taking  differently: Apply 1 application topically as needed [facility]). Beginning may June 1st   apply a small amount of emla cream  to a cotton ball and apply over port site 1-2  Hours prior to chemotherapy . Do not rub in cream . Cover with plastic wrap.) 30 g 0  . lisinopril (ZESTRIL) 2.5 MG tablet TAKE 1 TABLET BY MOUTH EVERY DAY (Patient taking differently: Take  2.5 mg by mouth daily. ) 90 tablet 1   No current facility-administered medications for this visit.    SURGICAL HISTORY:  Past Surgical History:  Procedure Laterality Date  . ABDOMINAL AORTIC ENDOVASCULAR STENT GRAFT N/A 03/12/2016   Procedure: ABDOMINAL AORTIC ENDOVASCULAR STENT GRAFT;  Surgeon: Conrad Montclair, MD;  Location: [facility];  Service: Vascular;  Laterality: N/A;  . COLONOSCOPY    . INGUINAL HERNIA REPAIR Left 1975   Left inguinal hernia  . INGUINAL HERNIA REPAIR Right   . IR IMAGING GUIDED PORT INSERTION  09/04/2019  . LOBECTOMY Left 05/11/2016   Procedure: LEFT LOWER LOBE LOBECTOMY AND LEFT UPPER LOBE  RESECTION AND PLACEMENT OF ON-Q;  Surgeon: [name], MD;  Location: [facility];  Service: Thoracic;  Laterality: Left;  . LUMBAR LAMINECTOMY  October 22, 2012  . LYMPH NODE DISSECTION Left 05/11/2016   Procedure: LYMPH NODE DISSECTION;  Surgeon: [name], MD;  Location: [facility];  Service: Thoracic;  Laterality: Left;  . STAPLING OF BLEBS Left 05/11/2016   Procedure: STAPLING OF APICAL BLEB;  Surgeon: [name], MD;  Location: [facility];  Service: Thoracic;  Laterality: Left;  . TEE WITHOUT CARDIOVERSION N/A 10/07/2019   Procedure: TRANSESOPHAGEAL ECHOCARDIOGRAM (TEE);  Surgeon: Dorothy Spark, MD;  Location: [facility] ENDOSCOPY;  Service: Cardiovascular;  Laterality: N/A;  . VIDEO ASSISTED THORACOSCOPY Left 05/18/2016   Procedure: VIDEO ASSISTED THORACOSCOPY WITH REMOVAL OF LEFT APICAL BLEB;  Surgeon: [name], MD;  Location: [facility];  Service: Thoracic;  Laterality: Left;  Marland Kitchen VIDEO ASSISTED THORACOSCOPY (VATS)/WEDGE RESECTION Left 05/11/2016   Procedure: LEFT VIDEO ASSISTED THORACOSCOPY (VATS);  Surgeon: [name], MD;  Location: [facility];  Service: Thoracic;  Laterality: Left;  Marland Kitchen VIDEO BRONCHOSCOPY N/A 05/11/2016   Procedure: VIDEO BRONCHOSCOPY, LEFT LUNG;  Surgeon: [name], MD;  Location: [facility];  Service: Thoracic;  Laterality: N/A;  . VIDEO BRONCHOSCOPY  N/A 05/18/2016   Procedure: VIDEO BRONCHOSCOPY WITH BRONCHIAL WASHING;  Surgeon: [name], MD;  Location: [facility];  Service: Thoracic;  Laterality: N/A;    REVIEW OF SYSTEMS:   Review of Systems  Constitutional: Negative for appetite change, chills, fatigue, fever and unexpected weight change.  HENT:   Negative for mouth sores, nosebleeds, sore throat and trouble swallowing.   Eyes: Negative for eye problems and icterus.  Respiratory: Negative for cough, hemoptysis, shortness of breath and wheezing.   Cardiovascular: Negative for chest pain and leg swelling.  Gastrointestinal: Negative for abdominal pain, constipation, diarrhea, nausea and vomiting.  Genitourinary: Negative for bladder incontinence, difficulty urinating, dysuria, frequency and hematuria.   Musculoskeletal: Negative for back pain, gait problem, neck pain and neck stiffness.  Skin: Negative for itching and rash.  Neurological: Negative for dizziness, extremity weakness, gait problem, headaches, light-headedness and seizures.  Hematological: Negative for adenopathy. Does not bruise/bleed easily.  Psychiatric/Behavioral: Negative for confusion, depression and sleep disturbance. The patient is not nervous/anxious.     PHYSICAL EXAMINATION:  Blood pressure (!) 146/72, pulse 71, temperature 97.6 F (36.4 C), temperature source Tympanic, resp. rate  20, height 6' 3.5" (1.918 m), weight 158 lb (71.7 kg), SpO2 96 %.  ECOG PERFORMANCE STATUS: 1 - Symptomatic but completely ambulatory  Physical Exam  Constitutional: Oriented to person, place, and time and well-developed, well-nourished, and in no distress.  HENT:  Head: Normocephalic and atraumatic.  Mouth/Throat: Oropharynx is clear and moist. No oropharyngeal exudate.  Eyes: Conjunctivae are normal. Right eye exhibits no discharge. Left eye exhibits no discharge. No scleral icterus.  Neck: Normal range of motion. Neck supple.  Cardiovascular: Systolic murmur auscultated  in his posterior left thorax.Normal rate, regular rhythm and intact distal pulses.   Pulmonary/Chest: Effort normal and breath sounds normal. No respiratory distress. No wheezes. No rales.  Abdominal: Soft. Bowel sounds are normal. Exhibits no distension and no mass. There is no tenderness.  Musculoskeletal: Normal range of motion. Exhibits no edema.  Lymphadenopathy:    No cervical adenopathy.  Neurological: Alert and oriented to person, place, and time. Exhibits normal muscle tone. Gait normal. Coordination normal.  Skin: Skin is warm and dry. No rash noted. Not diaphoretic. No erythema. No pallor.  Psychiatric: Mood, memory and judgment normal.  Vitals reviewed.  LABORATORY DATA: Lab Results  Component Value Date   WBC 6.9 12/03/2019   HGB 11.8 (L) 12/03/2019   HCT 35.9 (L) 12/03/2019   MCV 99.7 12/03/2019   PLT 433 (H) 12/03/2019      Chemistry      Component Value Date/Time   NA 135 11/12/2019 0835   NA 132 (L) 10/05/2019 0830   NA 138 01/11/2017 1343   K 4.3 11/12/2019 0835   K 4.7 01/11/2017 1343   CL 105 11/12/2019 0835   CO2 21 (L) 11/12/2019 0835   CO2 23 01/11/2017 1343   BUN 11 11/12/2019 0835   BUN 14 10/05/2019 0830   BUN 20.2 01/11/2017 1343   CREATININE 1.34 (H) 11/12/2019 0835   CREATININE 1.6 (H) 01/11/2017 1343      Component Value Date/Time   CALCIUM 9.4 11/12/2019 0835   CALCIUM 9.3 01/11/2017 1343   ALKPHOS 84 11/12/2019 0835   ALKPHOS 89 01/11/2017 1343   AST 28 11/12/2019 0835   AST 41 (H) 01/11/2017 1343   ALT 15 11/12/2019 0835   ALT 27 01/11/2017 1343   BILITOT 0.4 11/12/2019 0835   BILITOT 0.49 01/11/2017 1343       RADIOGRAPHIC STUDIES:  No results found.   ASSESSMENT/PLAN:  This is a very pleasant 64year old [place] American male with metastatic disease who was initially diagnosed with stage IB non-small cell lung cancer, adenocarcinoma of the left upper lobe. Molecular studies showed no actionable mutations. PD-L1 expression  was negative. First diagnosed December 2017.  He was status post a wedge resection of the left upper lobe followed by 4 cycles of adjuvant systemic chemotherapy with cisplatin and Alimta. He tolerated treatment well except for fatigue.  He had been on observationuntilJune of 2018.  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   Theis currently undergoingsystemic chemotherapy with carboplatin, alimta, and keytruda. He is status post33cycles. Starting from cycle #5, he has been on maintenance Alimta and Keytruda every 3 weeks.   Labs were reviewed. Recommend that the patient will proceed with cycle #34 today scheduled.  He will return to the clinic for a follow up visit in 3 weeks for evaluation before starting cycle #35.  The patient will continue to follow with cardiology closely regarding his mitral valve regurgitation.  The patient was advised to call immediately if he has any concerning symptoms in the interval. The patient voices understanding of current disease status and treatment options and is in agreement with the current care plan. All questions were answered. The patient knows to call the clinic with any problems, questions or concerns. We can certainly see the patient much sooner if necessary   No orders of the defined types were placed in this encounter.    Gavinn Collard L Aashna Matson, PA-C 12/03/19

## 2019-12-03 ENCOUNTER — Inpatient Hospital Stay: Payer: No Typology Code available for payment source

## 2019-12-03 ENCOUNTER — Other Ambulatory Visit: Payer: Self-pay

## 2019-12-03 ENCOUNTER — Inpatient Hospital Stay: Payer: No Typology Code available for payment source | Attending: Internal Medicine | Admitting: Physician Assistant

## 2019-12-03 VITALS — BP 146/72 | HR 71 | Temp 97.6°F | Resp 20 | Ht 75.5 in | Wt 158.0 lb

## 2019-12-03 DIAGNOSIS — C3492 Malignant neoplasm of unspecified part of left bronchus or lung: Secondary | ICD-10-CM

## 2019-12-03 DIAGNOSIS — Z79899 Other long term (current) drug therapy: Secondary | ICD-10-CM | POA: Insufficient documentation

## 2019-12-03 DIAGNOSIS — C3412 Malignant neoplasm of upper lobe, left bronchus or lung: Secondary | ICD-10-CM | POA: Insufficient documentation

## 2019-12-03 DIAGNOSIS — Z5111 Encounter for antineoplastic chemotherapy: Secondary | ICD-10-CM | POA: Insufficient documentation

## 2019-12-03 DIAGNOSIS — C3432 Malignant neoplasm of lower lobe, left bronchus or lung: Secondary | ICD-10-CM

## 2019-12-03 DIAGNOSIS — Z95828 Presence of other vascular implants and grafts: Secondary | ICD-10-CM

## 2019-12-03 DIAGNOSIS — Z5112 Encounter for antineoplastic immunotherapy: Secondary | ICD-10-CM | POA: Diagnosis present

## 2019-12-03 LAB — CBC WITH DIFFERENTIAL (CANCER CENTER ONLY)
Abs Immature Granulocytes: 0.05 10*3/uL (ref 0.00–0.07)
Basophils Absolute: 0.1 10*3/uL (ref 0.0–0.1)
Basophils Relative: 1 %
Eosinophils Absolute: 0.3 10*3/uL (ref 0.0–0.5)
Eosinophils Relative: 4 %
HCT: 35.9 % — ABNORMAL LOW (ref 39.0–52.0)
Hemoglobin: 11.8 g/dL — ABNORMAL LOW (ref 13.0–17.0)
Immature Granulocytes: 1 %
Lymphocytes Relative: 17 %
Lymphs Abs: 1.2 10*3/uL (ref 0.7–4.0)
MCH: 32.8 pg (ref 26.0–34.0)
MCHC: 32.9 g/dL (ref 30.0–36.0)
MCV: 99.7 fL (ref 80.0–100.0)
Monocytes Absolute: 1.3 10*3/uL — ABNORMAL HIGH (ref 0.1–1.0)
Monocytes Relative: 18 %
Neutro Abs: 4.1 10*3/uL (ref 1.7–7.7)
Neutrophils Relative %: 59 %
Platelet Count: 433 10*3/uL — ABNORMAL HIGH (ref 150–400)
RBC: 3.6 MIL/uL — ABNORMAL LOW (ref 4.22–5.81)
RDW: 15.3 % (ref 11.5–15.5)
WBC Count: 6.9 10*3/uL (ref 4.0–10.5)
nRBC: 0 % (ref 0.0–0.2)

## 2019-12-03 LAB — CMP (CANCER CENTER ONLY)
ALT: 10 U/L (ref 0–44)
AST: 26 U/L (ref 15–41)
Albumin: 2.9 g/dL — ABNORMAL LOW (ref 3.5–5.0)
Alkaline Phosphatase: 80 U/L (ref 38–126)
Anion gap: 8 (ref 5–15)
BUN: 13 mg/dL (ref 8–23)
CO2: 20 mmol/L — ABNORMAL LOW (ref 22–32)
Calcium: 9 mg/dL (ref 8.9–10.3)
Chloride: 105 mmol/L (ref 98–111)
Creatinine: 1.34 mg/dL — ABNORMAL HIGH (ref 0.61–1.24)
GFR, Est AFR Am: >60 mL/min (ref >60)
GFR, Estimated: 55 mL/min — ABNORMAL LOW (ref >60)
Glucose, Bld: 91 mg/dL (ref 70–99)
Potassium: 4.3 mmol/L (ref 3.5–5.1)
Sodium: 133 mmol/L — ABNORMAL LOW (ref 135–145)
Total Bilirubin: 0.4 mg/dL (ref 0.3–1.2)
Total Protein: 6.5 g/dL (ref 6.5–8.1)

## 2019-12-03 MED ORDER — SODIUM CHLORIDE 0.9% FLUSH
10.0000 mL | INTRAVENOUS | Status: DC | PRN
  Administered 2019-12-03: 10 mL
  Filled 2019-12-03: qty 10

## 2019-12-03 MED ORDER — CYANOCOBALAMIN 1000 MCG/ML IJ SOLN
INTRAMUSCULAR | Status: AC
  Filled 2019-12-03: qty 1

## 2019-12-03 MED ORDER — SODIUM CHLORIDE 0.9 % IV SOLN
200.0000 mg | Freq: Once | INTRAVENOUS | Status: AC
  Administered 2019-12-03: 200 mg via INTRAVENOUS
  Filled 2019-12-03: qty 8

## 2019-12-03 MED ORDER — HEPARIN SOD (PORK) LOCK FLUSH 100 UNIT/ML IV SOLN
500.0000 [IU] | Freq: Once | INTRAVENOUS | Status: AC | PRN
  Administered 2019-12-03: 500 [IU]
  Filled 2019-12-03: qty 5

## 2019-12-03 MED ORDER — ONDANSETRON HCL 4 MG/2ML IJ SOLN
8.0000 mg | Freq: Once | INTRAMUSCULAR | Status: AC
  Administered 2019-12-03: 8 mg via INTRAVENOUS

## 2019-12-03 MED ORDER — SODIUM CHLORIDE 0.9 % IV SOLN
500.0000 mg/m2 | Freq: Once | INTRAVENOUS | Status: AC
  Administered 2019-12-03: 1000 mg via INTRAVENOUS
  Filled 2019-12-03: qty 40

## 2019-12-03 MED ORDER — SODIUM CHLORIDE 0.9% FLUSH
10.0000 mL | Freq: Once | INTRAVENOUS | Status: AC
  Administered 2019-12-03: 10 mL
  Filled 2019-12-03: qty 10

## 2019-12-03 MED ORDER — ONDANSETRON HCL 4 MG/2ML IJ SOLN
INTRAMUSCULAR | Status: AC
  Filled 2019-12-03: qty 4

## 2019-12-03 MED ORDER — SODIUM CHLORIDE 0.9 % IV SOLN
10.0000 mg | Freq: Once | INTRAVENOUS | Status: AC
  Administered 2019-12-03: 10 mg via INTRAVENOUS
  Filled 2019-12-03: qty 10

## 2019-12-03 MED ORDER — SODIUM CHLORIDE 0.9 % IV SOLN
Freq: Once | INTRAVENOUS | Status: AC
  Filled 2019-12-03: qty 250

## 2019-12-03 MED ORDER — CYANOCOBALAMIN 1000 MCG/ML IJ SOLN
1000.0000 ug | Freq: Once | INTRAMUSCULAR | Status: AC
  Administered 2019-12-03: 1000 ug via INTRAMUSCULAR

## 2019-12-03 NOTE — Patient Instructions (Signed)

## 2019-12-03 NOTE — Patient Instructions (Signed)
[facility] Discharge Instructions for Patients Receiving Chemotherapy  Today you received the following chemotherapy agents: Keytruda, Alimta  To help prevent nausea and vomiting after your treatment, we encourage you to take your nausea medication as directed.    If you develop nausea and vomiting that is not controlled by your nausea medication, call the clinic.   BELOW ARE SYMPTOMS THAT SHOULD BE REPORTED IMMEDIATELY:  *FEVER GREATER THAN 100.5 F  *CHILLS WITH OR WITHOUT FEVER  NAUSEA AND VOMITING THAT IS NOT CONTROLLED WITH YOUR NAUSEA MEDICATION  *UNUSUAL SHORTNESS OF BREATH  *UNUSUAL BRUISING OR BLEEDING  TENDERNESS IN MOUTH AND THROAT WITH OR WITHOUT PRESENCE OF ULCERS  *URINARY PROBLEMS  *BOWEL PROBLEMS  UNUSUAL RASH Items with * indicate a potential emergency and should be followed up as soon as possible.  Feel free to call the clinic should you have any questions or concerns. The clinic phone number is [phone].  Please show the [facility] at check-in to the Emergency Department and triage nurse.

## 2019-12-03 NOTE — Progress Notes (Signed)
Patients TSH hemolyzed per Cassie no recollect today, will recheck at next visit.

## 2019-12-04 ENCOUNTER — Telehealth: Payer: Self-pay | Admitting: *Deleted

## 2019-12-04 NOTE — Telephone Encounter (Signed)
Completed [facility] Attending Physicians statement- Progress Report and faxed [phone]

## 2019-12-16 ENCOUNTER — Encounter: Payer: Self-pay | Admitting: Cardiology

## 2019-12-16 ENCOUNTER — Other Ambulatory Visit: Payer: Self-pay

## 2019-12-16 ENCOUNTER — Ambulatory Visit (INDEPENDENT_AMBULATORY_CARE_PROVIDER_SITE_OTHER): Payer: No Typology Code available for payment source | Admitting: Cardiology

## 2019-12-16 VITALS — BP 95/65 | HR 95 | Ht 75.5 in | Wt 155.6 lb

## 2019-12-16 DIAGNOSIS — I340 Nonrheumatic mitral (valve) insufficiency: Secondary | ICD-10-CM | POA: Diagnosis not present

## 2019-12-16 DIAGNOSIS — C3432 Malignant neoplasm of lower lobe, left bronchus or lung: Secondary | ICD-10-CM

## 2019-12-16 DIAGNOSIS — I712 Thoracic aortic aneurysm, without rupture: Secondary | ICD-10-CM | POA: Diagnosis not present

## 2019-12-16 DIAGNOSIS — I341 Nonrheumatic mitral (valve) prolapse: Secondary | ICD-10-CM

## 2019-12-16 DIAGNOSIS — I7121 Aneurysm of the ascending aorta, without rupture: Secondary | ICD-10-CM

## 2019-12-16 DIAGNOSIS — I10 Essential (primary) hypertension: Secondary | ICD-10-CM | POA: Diagnosis not present

## 2019-12-16 DIAGNOSIS — I5033 Acute on chronic diastolic (congestive) heart failure: Secondary | ICD-10-CM

## 2019-12-16 NOTE — Patient Instructions (Addendum)
Medication Instructions:   Your physician recommends that you continue on your current medications as directed. Please refer to the Current Medication list given to you today.  *If you need a refill on your cardiac medications before your next appointment, please call your pharmacy*   You have been referred to DR. GERHARDT AT [facility] FOR CONSULTATION TO DISCUSS REPAIR OF SEVERE MITRAL REGURGITATION NOTED ON RECENT TEE PER DR. Meda Coffee   Follow-Up: At [facility], you and your health needs are our priority.  As part of our continuing mission to provide you with exceptional heart care, we have created designated Provider Care Teams.  These Care Teams include your primary Cardiologist (physician) and Advanced Practice Providers (APPs -  Physician Assistants and Nurse Practitioners) who all work together to provide you with the care you need, when you need it.  We recommend signing up for the patient portal called "MyChart".  Sign up information is provided on this After Visit Summary.  MyChart is used to connect with patients for Virtual Visits (Telemedicine).  Patients are able to view lab/test results, encounter notes, upcoming appointments, etc.  Non-urgent messages can be sent to your provider as well.   To learn more about what you can do with MyChart, go to [email or web address].    Your next appointment:   3 month(s)  The format for your next appointment:   In Person  Provider:   [name], MD--SCHEDULING ADD TO ANY SLOT ON DR. Francesca Oman SCHEDULE FOR 04/05/20 PER DR. Meda Coffee

## 2019-12-16 NOTE — Progress Notes (Signed)
Cardiology Office Note    Date:  12/16/2019   ID:  William Kales [name], DOB April 30, 1955, MRN [id number]  PCP:  William Lei, MD  Cardiologist:  William Dawley, MD   Reason for visit: Follow up to discuss TEE, severe mitral regurgitation  History of Present Illness:  [name]. is a 64 y.o. male, very pleasant patient with a history of of AAA s/p stent graft in 02/2016, 40 PPY history of tobacco abuse, severe mitral regurgitation was diagnosed with a left lung mass on PFT and PET scan. 8 x 3.2 cm superior segment left lower lobe lung mass is hypermetabolic and consistent with primary lung neoplasm in December 2017. No mediastinal or hilar lymphadenopathy and no findings to suggest metastatic disease. 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 The imaging studies in 2019 including CT scan of the chest as well as a PET scan showed evidence for disease recurrence with metastatic disease presented with bilateral pulmonary nodules as well as destructive bone lesions at T4 vertebral body, biopsy proven to be metastatic adenocarcinoma. Molecular studies by foundation 1 showed no actionable mutations.  PDL 1 expression was negative. The patient is currently undergoing treatment with carboplatin, Alimta and Ketruda (pembrolizumab) status post 26 cycles.  Starting from cycle #5 he is on maintenance treatment with Alimta and Keytruda every 3 weeks.  Per Dr. Earlie Barker his lesions are shrinking, he is last scan in July 2021 showed stable finding.  I have discussed his case with Dr [name] and he believes that we should proceed with plan mitral valve repair as if the patient did not have cancer..  He has repeat scans every 3 months.  Over the last year the patient has been progressively more symptomatic, he used  to work [place] but has stopped.  He now gets short of breath with most activities, also gets episode of paroxysmal nocturnal dyspnea.  Denies any palpitations or chest pain.  No lower extremity edema.  Past Medical History:  Diagnosis Date  . AAA (abdominal aortic aneurysm) ([place])   . AAA (abdominal aortic aneurysm) without rupture ([place]) 02/04/2014  . Cancer ([place])    lung  . Encounter for antineoplastic chemotherapy 06/06/2016  . History of hepatitis B   . HNP (herniated nucleus pulposus), lumbar    L4 with radiculopathy  . Hypertension   . Lung cancer ([place]) 05/18/2016  . Malignant neoplasm of lower lobe of left lung ([place]) 04/05/2016  . Mass of left lung   . Mitral insufficiency 04/06/2016   This patient will eventually need MV repair if his prognosis is good from oncology standpoint. However, his lung cancer therapy  is the priority right now. His cardiac condition won't preclude possible lung surgery.  Once his lung cancer is under control he will need a TEE to further evaluate his mitral valve anatomy and MR severity, and also have ischemic workup as tere is evidence on calcificati  . Renal insufficiency 10/09/2016  . S/P partial lobectomy of lung 05/11/2016  . Varicose veins of legs    Past Surgical History:  Procedure Laterality Date  . ABDOMINAL AORTIC ENDOVASCULAR STENT GRAFT N/A 03/12/2016   Procedure: ABDOMINAL AORTIC ENDOVASCULAR STENT GRAFT;  Surgeon: Conrad Russellville, MD;  Location: [facility];  Service: Vascular;  Laterality: N/A;  .  COLONOSCOPY    . INGUINAL HERNIA REPAIR Left 1975   Left inguinal hernia  . INGUINAL HERNIA REPAIR Right   . IR IMAGING GUIDED PORT INSERTION  09/04/2019  . LOBECTOMY Left 05/11/2016   Procedure: LEFT LOWER LOBE LOBECTOMY AND LEFT UPPER LOBE  RESECTION AND PLACEMENT OF ON-Q;  Surgeon: [name], MD;  Location: [facility];  Service: Thoracic;  Laterality: Left;  . LUMBAR LAMINECTOMY  October 22, 2012  . LYMPH NODE DISSECTION Left  05/11/2016   Procedure: LYMPH NODE DISSECTION;  Surgeon: [name], MD;  Location: [facility];  Service: Thoracic;  Laterality: Left;  . STAPLING OF BLEBS Left 05/11/2016   Procedure: STAPLING OF APICAL BLEB;  Surgeon: [name], MD;  Location: [facility];  Service: Thoracic;  Laterality: Left;  . TEE WITHOUT CARDIOVERSION N/A 10/07/2019   Procedure: TRANSESOPHAGEAL ECHOCARDIOGRAM (TEE);  Surgeon: Dorothy Spark, MD;  Location: [facility] ENDOSCOPY;  Service: Cardiovascular;  Laterality: N/A;  . VIDEO ASSISTED THORACOSCOPY Left 05/18/2016   Procedure: VIDEO ASSISTED THORACOSCOPY WITH REMOVAL OF LEFT APICAL BLEB;  Surgeon: [name], MD;  Location: [facility];  Service: Thoracic;  Laterality: Left;  Marland Kitchen VIDEO ASSISTED THORACOSCOPY (VATS)/WEDGE RESECTION Left 05/11/2016   Procedure: LEFT VIDEO ASSISTED THORACOSCOPY (VATS);  Surgeon: [name], MD;  Location: [facility];  Service: Thoracic;  Laterality: Left;  Marland Kitchen VIDEO BRONCHOSCOPY N/A 05/11/2016   Procedure: VIDEO BRONCHOSCOPY, LEFT LUNG;  Surgeon: [name], MD;  Location: [facility];  Service: Thoracic;  Laterality: N/A;  . VIDEO BRONCHOSCOPY N/A 05/18/2016   Procedure: VIDEO BRONCHOSCOPY WITH BRONCHIAL WASHING;  Surgeon: [name], MD;  Location: [facility];  Service: Thoracic;  Laterality: N/A;   Current Medications: Outpatient Medications Prior to Visit  Medication Sig Dispense Refill  . carvedilol (COREG) 6.25 MG tablet Take 1 tablet (6.25 mg total) by mouth 2 (two) times daily. 315 tablet 3  . folic acid (FOLVITE) 1 MG tablet Take 1 tablet (1 mg total) by mouth daily. 90 tablet 1  . lidocaine-prilocaine (EMLA) cream Apply 1 application topically as needed. Beginning may June 1st   apply a small amount of emla cream  to a cotton ball and apply over port site 1-2  Hours prior to chemotherapy . Do not rub in cream . Cover with plastic wrap. 30 g 0  . lisinopril (ZESTRIL) 2.5 MG tablet TAKE 1 TABLET BY MOUTH EVERY DAY 90 tablet 1  . folic  acid (FOLVITE) 1 MG tablet TAKE 1 TABLET BY MOUTH EVERY DAY 90 tablet 1   No facility-administered medications prior to visit.    Allergies:   Tramadol   Social History   Socioeconomic History  . Marital status: Married    Spouse name: Not on file  . Number of children: Not on file  . Years of education: Not on file  . Highest education level: Not on file  Occupational History  . Not on file  Tobacco Use  . Smoking status: Former Smoker    Packs/day: 1.00    Types: Cigarettes    Quit date: 03/05/2016    Years since quitting: 3.7  . Smokeless tobacco: Never Used  Vaping Use  . Vaping Use: Never used  Substance and Sexual Activity  . Alcohol use: Yes    Alcohol/week: 6.0 standard drinks    Types: 6 Cans of beer per week  . 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 Sexual activity: Not on file  Other Topics Concern  . Not on file  Social History Narrative  . Not on file   Social Determinants of Health   Financial Resource Strain:   . Difficulty of Paying Living Expenses: Not on file  Food Insecurity:   . Worried About Charity fundraiser in the Last Year: Not on file  . Ran Out of Food in the Last Year: Not on file  Transportation Needs:   . Lack of Transportation (Medical): Not on file  . Lack of Transportation (Non-Medical): Not on file  Physical Activity:   . Days of Exercise per Week: Not on file  . Minutes of Exercise per Session: Not on file  Stress:   . Feeling of Stress : Not on file  Social Connections:   . Frequency of Communication with Friends and Family: Not on file  . Frequency of Social Gatherings with Friends and Family: Not on file  . Attends Religious Services: Not on file  . Active Member of Clubs or Organizations: Not on file  . Attends Archivist Meetings: Not on file  . Marital Status: Not on file    Family History:  The patient's family history includes Diabetes in his mother.   ROS:    Please see the history of present illness.    ROS All other systems reviewed and are negative.  PHYSICAL EXAM:    VS:  BP 95/65   Pulse 95   Ht 6' 3.5" (1.918 m)   Wt 155 lb 9.6 oz (70.6 kg)   SpO2 95%   BMI 19.19 kg/m    GEN: Well nourished, well developed, in no acute distress  HEENT: normal  Neck: no JVD, carotid bruits, or masses Cardiac: RRR; 4/6 systolic murmurs, rubs, or gallops,no edema  Respiratory:  clear to auscultation bilaterally, normal work of breathing GI: soft, nontender, nondistended, + BS MS: no deformity or atrophy  Skin: warm and dry, no rash Neuro:  Alert and Oriented x 3, Strength and sensation are intact Psych: euthymic mood, full affect  Wt Readings from Last 3 Encounters:  12/16/19 155 lb 9.6 oz (70.6 kg)  12/03/19 158 lb (71.7 kg)  11/12/19 156 lb 6.4 oz (70.9 kg)    Studies/Labs Reviewed:   EKG:  EKG is ordered today.  It shows normal sinus rhythm with LVH, early repolarization unchanged from prior. Personally reviewed.  Recent Labs: 11/12/2019: TSH 1.559 12/03/2019: ALT 10; BUN 13; Creatinine 1.34; Hemoglobin 11.8; Platelet Count 433; Potassium 4.3; Sodium 133   Lipid Panel No results found for: CHOL, TRIG, HDL, CHOLHDL, VLDL, LDLCALC, LDLDIRECT  Additional studies/ records that were reviewed today include:   TTE: 08/18/2019    1. Left ventricular ejection fraction, by estimation, is 60 to 65%. The  left ventricle has normal function. The left ventricle has no regional  wall motion abnormalities. There is mild left ventricular hypertrophy.  Left ventricular diastolic parameters  are consistent with Grade I diastolic dysfunction (impaired relaxation).  2. Right ventricular systolic function is normal. The right ventricular  size is normal. There is severely elevated pulmonary artery systolic  pressure. The estimated right ventricular systolic pressure is 35.4 mmHg.  3. Left atrial size was moderately dilated.  4. The mitral valve is  myxomatous. Severe mitral valve regurgitation.  5. The aortic valve is tricuspid. Aortic valve regurgitation is not  visualized.  6. Aortic dilatation noted. Aneurysm of the ascending aorta, measuring 40  mm. There  is moderate to severe dilatation at the level of the sinuses of  Valsalva measuring 49 mm.   TEE 10/07/2019  1. There is 4+ primary mitral regurgitation with myxomatous anterior  mitral valve leaflet, with prolapse of all three scallops. The jet is  posteriorly directed, eccentric with reversal of pulmonary vein forward  flow bilateraly. PISA radius 1.0 cm, ERO  0.45 cm2, regurgitation volume 58 ml. Posterior leaflet measures 1.0 cm.  Flail gap 0.3 cm.  2. Left ventricular ejection fraction, by estimation, is 60 to 65%. The  left ventricle has normal function. The left ventricle has no regional  wall motion abnormalities.  3. Right ventricular systolic function is normal. The right ventricular  size is normal. There is normal pulmonary artery systolic pressure.  4. Left atrial size was moderately dilated. No left atrial/left atrial  appendage thrombus was detected. The LAA emptying velocity was 40 cm/s.  5. Right atrial size was mildly dilated.  6. The mitral valve is myxomatous. No evidence of mitral valve  regurgitation. No evidence of mitral stenosis.  7. Tricuspid valve regurgitation is moderate.  8. The aortic valve is normal in structure. Aortic valve regurgitation is  trivial. No aortic stenosis is present.  9. There is moderate dilatation at the level of the sinuses of Valsalva  measuring 48 mm. There is mild (Grade II) plaque.    ASSESSMENT:    1. Severe mitral regurgitation   2. Mitral valve prolapse   3. Ascending aortic aneurysm ([place])   4. Essential hypertension   5. Malignant neoplasm of lower lobe of left lung ([place])   6. Acute on chronic diastolic CHF (congestive heart failure), NYHA class 3 (HCC)      PLAN:  In order of problems listed  above:  1.  Mitral valve prolapse severe 4+ mitral regurgitation, with worsening symptoms NYHA class IIb-III, TEE in June 2021 it showed 4+ primary mitral regurgitation with myxomatous anterior  mitral valve leaflet, with prolapse of all three scallops. The jet is  posteriorly directed, eccentric with reversal of pulmonary vein forward flow bilateraly.   The patient has been treated for lung cancer by Dr. Earlie Barker.  I have communicated with him patient's plan and he believes he should be treated as if he did not have cancer.  He is responding well to chemotherapy and his lesion has been shrinking.  He is scheduled for another round of PET scanning in July.    We will refer the patient to Dr. Servando Snare who previously operated on him and he also follows him 39 ascending aortic aneurysm..    2.  Ascending aortic aneurysm -followed by Dr. Servando Snare, I have reviewed his CT in December 2020 and maximum diameter of his ascending aorta is 42 mm, this is stable.  3.  Essential hypertension -well-controlled on lisinopril and amlodipine  4. Lung cancer -as above, followed by Dr. Earlie Barker  Medication Adjustments/Labs and Tests Ordered: Current medicines are reviewed at length with the patient today.  Concerns regarding medicines are outlined above.  Medication changes, Labs and Tests ordered today are listed in the Patient Instructions below.  Patient Instructions  Medication Instructions:   Your physician recommends that you continue on your current medications as directed. Please refer to the Current Medication list given to you today.  *If you need a refill on your cardiac medications before your next appointment, please call your pharmacy*   You have been referred to DR. GERHARDT AT [facility] FOR CONSULTATION TO DISCUSS REPAIR OF SEVERE  MITRAL REGURGITATION NOTED ON RECENT TEE PER DR. Meda Coffee   Follow-Up: At [facility], you and your health needs are our priority.  As part of our continuing  mission to provide you with exceptional heart care, we have created designated Provider Care Teams.  These Care Teams include your primary Cardiologist (physician) and Advanced Practice Providers (APPs -  Physician Assistants and Nurse Practitioners) who all work together to provide you with the care you need, when you need it.  We recommend signing up for the patient portal called "MyChart".  Sign up information is provided on this After Visit Summary.  MyChart is used to connect with patients for Virtual Visits (Telemedicine).  Patients are able to view lab/test results, encounter notes, upcoming appointments, etc.  Non-urgent messages can be sent to your provider as well.   To learn more about what you can do with MyChart, go to [email or web address].    Your next appointment:   3 month(s)  The format for your next appointment:   In Person  Provider:   [name], MD--SCHEDULING ADD TO ANY SLOT ON DR. Francesca Oman SCHEDULE FOR 04/05/20 PER DR. Meda Coffee        Signed, William Dawley, MD  12/16/2019 6:31 PM    [facility] [address], [place], [place]  [id number] Phone: [phone]; Fax: [phone]

## 2019-12-18 ENCOUNTER — Ambulatory Visit: Payer: No Typology Code available for payment source | Admitting: Cardiology

## 2019-12-24 ENCOUNTER — Inpatient Hospital Stay: Payer: No Typology Code available for payment source

## 2019-12-24 ENCOUNTER — Inpatient Hospital Stay: Payer: No Typology Code available for payment source | Attending: Internal Medicine | Admitting: Internal Medicine

## 2019-12-24 ENCOUNTER — Encounter: Payer: Self-pay | Admitting: Internal Medicine

## 2019-12-24 ENCOUNTER — Other Ambulatory Visit: Payer: Self-pay

## 2019-12-24 VITALS — BP 137/71 | HR 82 | Temp 97.1°F | Resp 18 | Ht 75.5 in | Wt 160.1 lb

## 2019-12-24 DIAGNOSIS — C3432 Malignant neoplasm of lower lobe, left bronchus or lung: Secondary | ICD-10-CM

## 2019-12-24 DIAGNOSIS — Z5112 Encounter for antineoplastic immunotherapy: Secondary | ICD-10-CM | POA: Diagnosis present

## 2019-12-24 DIAGNOSIS — C3492 Malignant neoplasm of unspecified part of left bronchus or lung: Secondary | ICD-10-CM | POA: Diagnosis not present

## 2019-12-24 DIAGNOSIS — C3490 Malignant neoplasm of unspecified part of unspecified bronchus or lung: Secondary | ICD-10-CM | POA: Diagnosis not present

## 2019-12-24 DIAGNOSIS — Z5111 Encounter for antineoplastic chemotherapy: Secondary | ICD-10-CM | POA: Insufficient documentation

## 2019-12-24 DIAGNOSIS — C3412 Malignant neoplasm of upper lobe, left bronchus or lung: Secondary | ICD-10-CM | POA: Diagnosis present

## 2019-12-24 DIAGNOSIS — Z79899 Other long term (current) drug therapy: Secondary | ICD-10-CM | POA: Insufficient documentation

## 2019-12-24 DIAGNOSIS — Z95828 Presence of other vascular implants and grafts: Secondary | ICD-10-CM

## 2019-12-24 LAB — CMP (CANCER CENTER ONLY)
ALT: 10 U/L (ref 0–44)
AST: 23 U/L (ref 15–41)
Albumin: 3.1 g/dL — ABNORMAL LOW (ref 3.5–5.0)
Alkaline Phosphatase: 87 U/L (ref 38–126)
Anion gap: 7 (ref 5–15)
BUN: 13 mg/dL (ref 8–23)
CO2: 23 mmol/L (ref 22–32)
Calcium: 9.5 mg/dL (ref 8.9–10.3)
Chloride: 104 mmol/L (ref 98–111)
Creatinine: 1.45 mg/dL — ABNORMAL HIGH (ref 0.61–1.24)
GFR, Est AFR Am: 58 mL/min — ABNORMAL LOW (ref >60)
GFR, Estimated: 50 mL/min — ABNORMAL LOW (ref >60)
Glucose, Bld: 92 mg/dL (ref 70–99)
Potassium: 4.4 mmol/L (ref 3.5–5.1)
Sodium: 134 mmol/L — ABNORMAL LOW (ref 135–145)
Total Bilirubin: 0.4 mg/dL (ref 0.3–1.2)
Total Protein: 6.5 g/dL (ref 6.5–8.1)

## 2019-12-24 LAB — CBC WITH DIFFERENTIAL (CANCER CENTER ONLY)
Abs Immature Granulocytes: 0.12 10*3/uL — ABNORMAL HIGH (ref 0.00–0.07)
Basophils Absolute: 0.1 10*3/uL (ref 0.0–0.1)
Basophils Relative: 1 %
Eosinophils Absolute: 0.3 10*3/uL (ref 0.0–0.5)
Eosinophils Relative: 3 %
HCT: 36.9 % — ABNORMAL LOW (ref 39.0–52.0)
Hemoglobin: 12 g/dL — ABNORMAL LOW (ref 13.0–17.0)
Immature Granulocytes: 1 %
Lymphocytes Relative: 13 %
Lymphs Abs: 1.1 10*3/uL (ref 0.7–4.0)
MCH: 33.2 pg (ref 26.0–34.0)
MCHC: 32.5 g/dL (ref 30.0–36.0)
MCV: 102.2 fL — ABNORMAL HIGH (ref 80.0–100.0)
Monocytes Absolute: 1.5 10*3/uL — ABNORMAL HIGH (ref 0.1–1.0)
Monocytes Relative: 17 %
Neutro Abs: 5.8 10*3/uL (ref 1.7–7.7)
Neutrophils Relative %: 65 %
Platelet Count: 464 10*3/uL — ABNORMAL HIGH (ref 150–400)
RBC: 3.61 MIL/uL — ABNORMAL LOW (ref 4.22–5.81)
RDW: 16 % — ABNORMAL HIGH (ref 11.5–15.5)
WBC Count: 8.9 10*3/uL (ref 4.0–10.5)
nRBC: 0 % (ref 0.0–0.2)

## 2019-12-24 LAB — TSH: TSH: 2.225 u[IU]/mL (ref 0.320–4.118)

## 2019-12-24 MED ORDER — ONDANSETRON HCL 4 MG/2ML IJ SOLN
8.0000 mg | Freq: Once | INTRAMUSCULAR | Status: AC
  Administered 2019-12-24: 8 mg via INTRAVENOUS

## 2019-12-24 MED ORDER — HEPARIN SOD (PORK) LOCK FLUSH 100 UNIT/ML IV SOLN
500.0000 [IU] | Freq: Once | INTRAVENOUS | Status: AC | PRN
  Administered 2019-12-24: 500 [IU]
  Filled 2019-12-24: qty 5

## 2019-12-24 MED ORDER — SODIUM CHLORIDE 0.9 % IV SOLN
200.0000 mg | Freq: Once | INTRAVENOUS | Status: AC
  Administered 2019-12-24: 200 mg via INTRAVENOUS
  Filled 2019-12-24: qty 8

## 2019-12-24 MED ORDER — SODIUM CHLORIDE 0.9 % IV SOLN
10.0000 mg | Freq: Once | INTRAVENOUS | Status: AC
  Administered 2019-12-24: 10 mg via INTRAVENOUS
  Filled 2019-12-24: qty 10

## 2019-12-24 MED ORDER — SODIUM CHLORIDE 0.9 % IV SOLN
500.0000 mg/m2 | Freq: Once | INTRAVENOUS | Status: AC
  Administered 2019-12-24: 1000 mg via INTRAVENOUS
  Filled 2019-12-24: qty 40

## 2019-12-24 MED ORDER — SODIUM CHLORIDE 0.9 % IV SOLN
Freq: Once | INTRAVENOUS | Status: AC
  Filled 2019-12-24: qty 250

## 2019-12-24 MED ORDER — SODIUM CHLORIDE 0.9% FLUSH
10.0000 mL | Freq: Once | INTRAVENOUS | Status: AC
  Administered 2019-12-24: 10 mL
  Filled 2019-12-24: qty 10

## 2019-12-24 MED ORDER — SODIUM CHLORIDE 0.9% FLUSH
10.0000 mL | INTRAVENOUS | Status: DC | PRN
  Administered 2019-12-24: 10 mL
  Filled 2019-12-24: qty 10

## 2019-12-24 MED ORDER — ONDANSETRON HCL 4 MG/2ML IJ SOLN
INTRAMUSCULAR | Status: AC
  Filled 2019-12-24: qty 4

## 2019-12-24 NOTE — Progress Notes (Signed)
[facility] [phone]   [phone]  OFFICE PROGRESS NOTE  [name], MD [address] [place] [address]  DIAGNOSIS: Metastatic non-small cell lung cancer, adenocarcinoma initially diagnosed as stage IB (T2a, N0, M0) non-small cell lung cancer, adenocarcinoma diagnosed in December 2017.  The patient has evidence for disease recurrence in July 2019.  Biomarker Findings Tumor Mutational Burden - TMB-Intermediate (16 Muts/Mb) Microsatellite status - MS-Stable Genomic Findings For a complete list of the genes assayed, please refer to the Appendix. KIT amplification KRAS R83E SMAD4 splice site [id number] SU11 I232F 7 Disease relevant genes with no reportable alterations: EGFR, ALK, BRAF, MET, RET, ERBB2, ROS1   PDL 1 expression is 0%  PRIOR THERAPY:  1) status post left lower lobectomy as well as wedge resection of the left upper lobe on 05/11/2016. 2) Adjuvant systemic chemotherapy with cisplatin 75 MG/M2 and Alimta 500 MG/M2 every 3 weeks. First dose 07/03/2016. Status post 4 cycles.  CURRENT THERAPY: Systemic chemotherapy with carboplatin for AUC of 5, Alimta 500 mg/M2 and Keytruda 200 mg IV every 3 weeks.  Status post 34 cycles.  Starting from cycle #5 the patient will be treated with maintenance Alimta and Ketruda (pembrolizumab) every 3 weeks.  INTERVAL HISTORY: William Kales [name] 64 y.o. male returns to the clinic today for follow-up visit.  The patient is feeling fine today with no concerning complaints except for mild fatigue.  He denied having any current chest pain, shortness of breath, cough or hemoptysis.  He denied having any nausea, vomiting, diarrhea or constipation.  He has no headache or visual changes.  He denied having any weight loss or night sweats.  The patient is here today for evaluation before starting cycle #5 of his treatment.  MEDICAL HISTORY: Past Medical History:  Diagnosis Date   AAA (abdominal  aortic aneurysm) ([facility])    AAA (abdominal aortic aneurysm) without rupture ([facility]) 02/04/2014   Cancer ([facility])    lung   Encounter for antineoplastic chemotherapy 06/06/2016   History of hepatitis B    HNP (herniated nucleus pulposus), lumbar    L4 with radiculopathy   Hypertension    Lung cancer ([facility]) 05/18/2016   Malignant neoplasm of lower lobe of left lung ([facility]) 04/05/2016   Mass of left lung    Mitral insufficiency 04/06/2016   This patient will eventually need MV repair if his prognosis is good from oncology standpoint. However, his lung cancer therapy  is the priority right now. His cardiac condition won't preclude possible lung surgery.  Once his lung cancer is under control he will need a TEE to further evaluate his mitral valve anatomy and MR severity, and also have ischemic workup as tere is evidence on calcificati   Renal insufficiency 10/09/2016   S/P partial lobectomy of lung 05/11/2016   Varicose veins of legs     ALLERGIES:  is allergic to tramadol.  MEDICATIONS:  Current Outpatient Medications  Medication Sig Dispense Refill   carvedilol (COREG) 6.25 MG tablet Take 1 tablet (6.25 mg total) by mouth 2 (two) times daily. 031 tablet 3   folic acid (FOLVITE) 1 MG tablet Take 1 tablet (1 mg total) by mouth daily. 90 tablet 1   lidocaine-prilocaine (EMLA) cream Apply 1 application topically as needed. Beginning may June 1st   apply a small amount of emla cream  to a cotton ball and apply over port site 1-2  Hours prior to chemotherapy .  Do not rub in cream . Cover with plastic wrap. 30 g 0   lisinopril (ZESTRIL) 2.5 MG tablet TAKE 1 TABLET BY MOUTH EVERY DAY 90 tablet 1   No current facility-administered medications for this visit.    SURGICAL HISTORY:  Past Surgical History:  Procedure Laterality Date   ABDOMINAL AORTIC ENDOVASCULAR STENT GRAFT N/A 03/12/2016   Procedure: ABDOMINAL AORTIC ENDOVASCULAR STENT GRAFT;  Surgeon: Conrad Haring, MD;  Location: [facility];   Service: Vascular;  Laterality: N/A;   COLONOSCOPY     INGUINAL HERNIA REPAIR Left 1975   Left inguinal hernia   INGUINAL HERNIA REPAIR Right    IR IMAGING GUIDED PORT INSERTION  09/04/2019   LOBECTOMY Left 05/11/2016   Procedure: LEFT LOWER LOBE LOBECTOMY AND LEFT UPPER LOBE  RESECTION AND PLACEMENT OF ON-Q;  Surgeon: [name], MD;  Location: [facility];  Service: Thoracic;  Laterality: Left;   LUMBAR LAMINECTOMY  October 22, 2012   LYMPH NODE DISSECTION Left 05/11/2016   Procedure: LYMPH NODE DISSECTION;  Surgeon: [name], MD;  Location: [facility];  Service: Thoracic;  Laterality: Left;   STAPLING OF BLEBS Left 05/11/2016   Procedure: STAPLING OF APICAL BLEB;  Surgeon: [name], MD;  Location: [facility];  Service: Thoracic;  Laterality: Left;   TEE WITHOUT CARDIOVERSION N/A 10/07/2019   Procedure: TRANSESOPHAGEAL ECHOCARDIOGRAM (TEE);  Surgeon: Dorothy Spark, MD;  Location: [facility] ENDOSCOPY;  Service: Cardiovascular;  Laterality: N/A;   VIDEO ASSISTED THORACOSCOPY Left 05/18/2016   Procedure: VIDEO ASSISTED THORACOSCOPY WITH REMOVAL OF LEFT APICAL BLEB;  Surgeon: [name], MD;  Location: [facility];  Service: Thoracic;  Laterality: Left;   VIDEO ASSISTED THORACOSCOPY (VATS)/WEDGE RESECTION Left 05/11/2016   Procedure: LEFT VIDEO ASSISTED THORACOSCOPY (VATS);  Surgeon: [name], MD;  Location: [facility];  Service: Thoracic;  Laterality: Left;   VIDEO BRONCHOSCOPY N/A 05/11/2016   Procedure: VIDEO BRONCHOSCOPY, LEFT LUNG;  Surgeon: [name], MD;  Location: [facility];  Service: Thoracic;  Laterality: N/A;   VIDEO BRONCHOSCOPY N/A 05/18/2016   Procedure: VIDEO BRONCHOSCOPY WITH BRONCHIAL WASHING;  Surgeon: [name], MD;  Location: [facility];  Service: Thoracic;  Laterality: N/A;    REVIEW OF SYSTEMS:  A comprehensive review of systems was negative except for: Constitutional: positive for fatigue   PHYSICAL EXAMINATION: General appearance: alert, cooperative  and no distress Head: Normocephalic, without obvious abnormality, atraumatic Neck: no adenopathy, no JVD, supple, symmetrical, trachea midline and thyroid not enlarged, symmetric, no tenderness/mass/nodules Lymph nodes: Cervical, supraclavicular, and axillary nodes normal. Resp: clear to auscultation bilaterally Back: symmetric, no curvature. ROM normal. No CVA tenderness. Cardio: regular rate and rhythm, S1, S2 normal, no murmur, click, rub or gallop GI: soft, non-tender; bowel sounds normal; no masses,  no organomegaly Extremities: extremities normal, atraumatic, no cyanosis or edema   ECOG PERFORMANCE STATUS: 1 - Symptomatic but completely ambulatory  Blood pressure 137/71, pulse 82, temperature (!) 97.1 F (36.2 C), temperature source Tympanic, resp. rate 18, height 6' 3.5" (1.918 m), weight 160 lb 1.6 oz (72.6 kg), SpO2 95 %.  LABORATORY DATA: Lab Results  Component Value Date   WBC 8.9 12/24/2019   HGB 12.0 (L) 12/24/2019   HCT 36.9 (L) 12/24/2019   MCV 102.2 (H) 12/24/2019   PLT 464 (H) 12/24/2019      Chemistry      Component Value Date/Time   NA 133 (L) 12/03/2019 0843   NA 132 (L) 10/05/2019 0830  NA 138 01/11/2017 1343   K 4.3 12/03/2019 0843   K 4.7 01/11/2017 1343   CL 105 12/03/2019 0843   CO2 20 (L) 12/03/2019 0843   CO2 23 01/11/2017 1343   BUN 13 12/03/2019 0843   BUN 14 10/05/2019 0830   BUN 20.2 01/11/2017 1343   CREATININE 1.34 (H) 12/03/2019 0843   CREATININE 1.6 (H) 01/11/2017 1343      Component Value Date/Time   CALCIUM 9.0 12/03/2019 0843   CALCIUM 9.3 01/11/2017 1343   ALKPHOS 80 12/03/2019 0843   ALKPHOS 89 01/11/2017 1343   AST 26 12/03/2019 0843   AST 41 (H) 01/11/2017 1343   ALT 10 12/03/2019 0843   ALT 27 01/11/2017 1343   BILITOT 0.4 12/03/2019 0843   BILITOT 0.49 01/11/2017 1343       RADIOGRAPHIC STUDIES: No results found.  ASSESSMENT AND PLAN:  This is a very pleasant 64 years old white male with a stage IB non-small  cell lung cancer, adenocarcinoma status post wedge resection of the left upper lobe followed by 4 cycles of adjuvant systemic chemotherapy with cisplatin and Alimta and he tolerated his treatment well except for fatigue. The patient has been on observation since June 2018.   The recent imaging studies including CT scan of the chest as well as a PET scan showed evidence for disease recurrence with metastatic disease presented with bilateral pulmonary nodules as well as destructive bone lesions at T4 vertebral body, biopsy proven to be metastatic adenocarcinoma. Molecular studies by foundation 1 showed no actionable mutations.  PDL 1 expression was negative. The patient is currently undergoing treatment with carboplatin, Alimta and Ketruda (pembrolizumab) status post 34 cycles.  Starting from cycle #5 he is on maintenance treatment with Alimta and Keytruda every 3 weeks. He has been tolerating this treatment well with no concerning adverse effects except for mild fatigue. I recommended for him to proceed with cycle #35 today as planned. I will see him back for follow-up visit in 3 weeks for evaluation after repeating CT scan of the chest, abdomen pelvis for restaging of his disease. The patient was advised to call immediately if he has any concerning symptoms in the interval. The patient voices understanding of current disease status and treatment options and is in agreement with the current care plan. All questions were answered. The patient knows to call the clinic with any problems, questions or concerns. We can certainly see the patient much sooner if necessary. Disclaimer: This note was dictated with voice recognition software. Similar sounding words can inadvertently be transcribed and may be missed upon review. Eilleen Kempf, MD 12/24/19

## 2019-12-24 NOTE — Patient Instructions (Signed)
[facility] Discharge Instructions for Patients Receiving Chemotherapy  Today you received the following chemotherapy agents: Keytruda, Alimta  To help prevent nausea and vomiting after your treatment, we encourage you to take your nausea medication as directed.    If you develop nausea and vomiting that is not controlled by your nausea medication, call the clinic.   BELOW ARE SYMPTOMS THAT SHOULD BE REPORTED IMMEDIATELY:  *FEVER GREATER THAN 100.5 F  *CHILLS WITH OR WITHOUT FEVER  NAUSEA AND VOMITING THAT IS NOT CONTROLLED WITH YOUR NAUSEA MEDICATION  *UNUSUAL SHORTNESS OF BREATH  *UNUSUAL BRUISING OR BLEEDING  TENDERNESS IN MOUTH AND THROAT WITH OR WITHOUT PRESENCE OF ULCERS  *URINARY PROBLEMS  *BOWEL PROBLEMS  UNUSUAL RASH Items with * indicate a potential emergency and should be followed up as soon as possible.  Feel free to call the clinic should you have any questions or concerns. The clinic phone number is [phone].  Please show the [facility] at check-in to the Emergency Department and triage nurse.

## 2020-01-07 ENCOUNTER — Other Ambulatory Visit: Payer: Self-pay

## 2020-01-07 ENCOUNTER — Encounter: Payer: Self-pay | Admitting: Thoracic Surgery (Cardiothoracic Vascular Surgery)

## 2020-01-07 ENCOUNTER — Institutional Professional Consult (permissible substitution) (INDEPENDENT_AMBULATORY_CARE_PROVIDER_SITE_OTHER): Payer: No Typology Code available for payment source | Admitting: Thoracic Surgery (Cardiothoracic Vascular Surgery)

## 2020-01-07 VITALS — BP 116/73 | HR 90 | Temp 98.1°F | Resp 20 | Ht 75.5 in | Wt 157.0 lb

## 2020-01-07 DIAGNOSIS — I340 Nonrheumatic mitral (valve) insufficiency: Secondary | ICD-10-CM | POA: Diagnosis not present

## 2020-01-07 DIAGNOSIS — I350 Nonrheumatic aortic (valve) stenosis: Secondary | ICD-10-CM

## 2020-01-07 NOTE — Progress Notes (Addendum)
[name]  CARDIOTHORACIC SURGERY CONSULTATION REPORT  Referring Provider is Dorothy Spark, MD PCP is [name], MD  Chief Complaint  Patient presents with  . 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  Patient states that he was first noted to have a heart murmur on physical exam when he was being worked up for primary lung cancer in November 2017 after CT imaging revealed suspicious mass in the left lung following stent graft repair of abdominal aortic aneurysm.  Echocardiogram performed at that time revealed normal left ventricular function with prolapse involving the anterior leaflet of the mitral valve and what was felt to be moderate mitral regurgitation.  The patient underwent uncomplicated left lower lobectomy with wedge resection of the left upper lobe by Dr. 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  The patient has been followed closely ever since by Dr. Earlie Server.  Follow-up CT and PET imaging performed in 2019 revealed disease recurrence with bilateral lung nodules and hypermetabolic destructive lesion in the T4 vertebral body consistent with bony involvement.  The patient has remained on long-term chemotherapy ever since (carboplatin for AUC of 5, Alimta 500 mg/M2 and Keytruda 200 mg IV) and follow-up CT imaging has revealed no progression of disease.  Over the past  few months the patient has developed exertional shortness of breath.  Follow-up transthoracic echocardiogram performed April 2021 revealed normal left ventricular function with severe mitral regurgitation and signs of elevated right heart pressures.  TEE performed October 07, 2019 confirmed the presence of severe mitral regurgitation.  The jet of regurgitation was posterior with overriding prolapse of the anterior leaflet.  There was reversal of flow in the pulmonary veins with PISA radius measured 1.0 cm, ERO 0.45 cm, and regurgitant volume estimated 58 mL.  Cardiothoracic surgical consultation was requested.  Patient is married and lives locally in [name].  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  He remains reasonably active functionally.  He reports a several month history of gradually progressive symptoms of exertional shortness of breath.  Symptoms of shortness of breath occur only with moderate or more strenuous physical exertion.  He denies resting shortness of breath or shortness of breath with low-level activities.  He recovers quickly with rest.  He denies any symptoms of chest pain or chest tightness.  He denies any history of PND, orthopnea, or lower extremity edema.  He has not had palpitations, dizziness, nor syncope.  Appetite is normal and weight has been stable.  He has not had atypical chest pain or exacerbation of pain in his back.  He states that he gets chemotherapy every 3 weeks.  He usually recovers within 48 hours following chemotherapy.  Past Medical History:  Diagnosis Date  . AAA (abdominal aortic aneurysm) ([place])   . AAA (abdominal aortic aneurysm) without rupture ([name]) 02/04/2014  . Cancer ([name])    lung  .  Encounter for antineoplastic chemotherapy 06/06/2016  . History of hepatitis B   . HNP (herniated nucleus pulposus), lumbar    L4 with radiculopathy  . Hypertension   . Lung  cancer ([name]) 05/18/2016  . Malignant neoplasm of lower lobe of left lung ([name]) 04/05/2016  . Mass of left lung   . Mitral insufficiency 04/06/2016   This patient will eventually need MV repair if his prognosis is good from oncology standpoint. However, his lung cancer therapy  is the priority right now. His cardiac condition won't preclude possible lung surgery.  Once his lung cancer is under control he will need a TEE to further evaluate his mitral valve anatomy and MR severity, and also have ischemic workup as tere is evidence on calcificati  . Renal insufficiency 10/09/2016  . S/P partial lobectomy of lung 05/11/2016  . Varicose veins of legs     Past Surgical History:  Procedure Laterality Date  . ABDOMINAL AORTIC ENDOVASCULAR STENT GRAFT N/A 03/12/2016   Procedure: ABDOMINAL AORTIC ENDOVASCULAR STENT GRAFT;  Surgeon: Conrad Aldan, MD;  Location: [place];  Service: Vascular;  Laterality: N/A;  . COLONOSCOPY    . INGUINAL HERNIA REPAIR Left 1975   Left inguinal hernia  . INGUINAL HERNIA REPAIR Right   . IR IMAGING GUIDED PORT INSERTION  09/04/2019  . LOBECTOMY Left 05/11/2016   Procedure: LEFT LOWER LOBE LOBECTOMY AND LEFT UPPER LOBE  RESECTION AND PLACEMENT OF ON-Q;  Surgeon: [name], MD;  Location: [place];  Service: Thoracic;  Laterality: Left;  . LUMBAR LAMINECTOMY  October 22, 2012  . LYMPH NODE DISSECTION Left 05/11/2016   Procedure: LYMPH NODE DISSECTION;  Surgeon: [name], MD;  Location: [place];  Service: Thoracic;  Laterality: Left;  . STAPLING OF BLEBS Left 05/11/2016   Procedure: STAPLING OF APICAL BLEB;  Surgeon: [name], MD;  Location: [place];  Service: Thoracic;  Laterality: Left;  . TEE WITHOUT CARDIOVERSION N/A 10/07/2019   Procedure: TRANSESOPHAGEAL ECHOCARDIOGRAM (TEE);  Surgeon: Dorothy Spark, MD;  Location: [facility];  Service: Cardiovascular;  Laterality: N/A;  . VIDEO ASSISTED THORACOSCOPY Left 05/18/2016   Procedure: VIDEO ASSISTED  THORACOSCOPY WITH REMOVAL OF LEFT APICAL BLEB;  Surgeon: [name], MD;  Location: [place];  Service: Thoracic;  Laterality: Left;  Marland Kitchen VIDEO ASSISTED THORACOSCOPY (VATS)/WEDGE RESECTION Left 05/11/2016   Procedure: LEFT VIDEO ASSISTED THORACOSCOPY (VATS);  Surgeon: [name], MD;  Location: [place];  Service: Thoracic;  Laterality: Left;  Marland Kitchen VIDEO BRONCHOSCOPY N/A 05/11/2016   Procedure: VIDEO BRONCHOSCOPY, LEFT LUNG;  Surgeon: [name], MD;  Location: [place];  Service: Thoracic;  Laterality: N/A;  . VIDEO BRONCHOSCOPY N/A 05/18/2016   Procedure: VIDEO BRONCHOSCOPY WITH BRONCHIAL WASHING;  Surgeon: [name], MD;  Location: [facility];  Service: Thoracic;  Laterality: N/A;    Family History  Problem Relation Age of Onset  . Diabetes Mother     Social History   Socioeconomic History  . Marital status: Married    Spouse name: Not on file  . Number of children: Not on file  . Years of education: Not on file  . Highest education level: Not on file  Occupational History  . Not on file  Tobacco Use  . Smoking status: Former Smoker    Packs/day: 1.00    Types: Cigarettes    Quit date: 03/05/2016    Years since quitting: 3.8  . Smokeless tobacco: Never Used  Vaping Use  .  Vaping Use: Never used  Substance and Sexual Activity  . Alcohol use: Yes    Alcohol/week: 6.0 standard drinks    Types: 6 Cans of beer per week  . 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 Sexual activity: Not on file  Other Topics Concern  . Not on file  Social History Narrative  . Not on file   Social Determinants of Health   Financial Resource Strain:   . Difficulty of Paying Living Expenses: Not on file  Food Insecurity:   . Worried About Charity fundraiser in the Last Year: Not on file  . Ran Out of Food in the Last Year: Not on file  Transportation Needs:   . Lack of Transportation (Medical): Not on file  . Lack of Transportation  (Non-Medical): Not on file  Physical Activity:   . Days of Exercise per Week: Not on file  . Minutes of Exercise per Session: Not on file  Stress:   . Feeling of Stress : Not on file  Social Connections:   . Frequency of Communication with Friends and Family: Not on file  . Frequency of Social Gatherings with Friends and Family: Not on file  . Attends Religious Services: Not on file  . Active Member of Clubs or Organizations: Not on file  . Attends Archivist Meetings: Not on file  . Marital Status: Not on file  Intimate Partner Violence:   . Fear of Current or Ex-Partner: Not on file  . Emotionally Abused: Not on file  . Physically Abused: Not on file  . Sexually Abused: Not on file    Current Outpatient Medications  Medication Sig Dispense Refill  . carvedilol (COREG) 6.25 MG tablet Take 1 tablet (6.25 mg total) by mouth 2 (two) times daily. 960 tablet 3  . folic acid (FOLVITE) 1 MG tablet Take 1 tablet (1 mg total) by mouth daily. 90 tablet 1  . lidocaine-prilocaine (EMLA) cream Apply 1 application topically as needed. Beginning may June 1st   apply a small amount of emla cream  to a cotton ball and apply over port site 1-2  Hours prior to chemotherapy . Do not rub in cream . Cover with plastic wrap. 30 g 0  . lisinopril (ZESTRIL) 2.5 MG tablet TAKE 1 TABLET BY MOUTH EVERY DAY 90 tablet 1   No current facility-administered medications for this visit.    Allergies  Allergen Reactions  . Tramadol Nausea And Vomiting      Review of Systems:   General:  Normal appetite, normal energy, no weight gain, no weight loss, no fever  Cardiac:  no chest pain with exertion, no chest pain at rest, +SOB with exertion, no resting SOB, no PND, no orthopnea, no palpitations, no arrhythmia, no atrial fibrillation, no LE edema, no dizzy spells, no syncope  Respiratory:  + exertional shortness of breath, no home oxygen, no productive cough, no dry cough, no bronchitis, no wheezing, no  hemoptysis, no asthma, no pain with inspiration or cough, no sleep apnea, no CPAP at night  GI:   no difficulty swallowing, no reflux, no frequent heartburn, no hiatal hernia, no abdominal pain, no constipation, no diarrhea, no hematochezia, no hematemesis, no melena  GU:   no dysuria,  no frequency, no urinary tract infection, no hematuria, no enlarged prostate, no kidney stones, no kidney disease  Vascular:  no pain suggestive of claudication, no pain in  feet, no leg cramps, no varicose veins, no DVT, no non-healing foot ulcer  Neuro:   no stroke, no TIA's, no seizures, no headaches, no temporary blindness one eye,  no slurred speech, no peripheral neuropathy, no chronic pain, no instability of gait, no memory/cognitive dysfunction  Musculoskeletal: no arthritis, no joint swelling, no myalgias, no difficulty walking, normal mobility   Skin:   no rash, no itching, no skin infections, no pressure sores or ulcerations  Psych:   no anxiety, no depression, no nervousness, no unusual recent stress  Eyes:   no blurry vision, no floaters, no recent vision changes, + wears glasses or contacts  ENT:   no hearing loss, no loose or painful teeth, edentulous with full dentures  Hematologic:  no easy bruising, no abnormal bleeding, no clotting disorder, no frequent epistaxis  Endocrine:  no diabetes, does not check CBG's at home           Physical Exam:   BP 116/73   Pulse 90   Temp 98.1 F (36.7 C) (Skin)   Resp 20   Ht 6' 3.5" (1.918 m)   Wt 157 lb (71.2 kg)   SpO2 96% Comment: RA  BMI 19.36 kg/m   General:  Thin,  well-appearing  HEENT:  Unremarkable   Neck:   no JVD, no bruits, no adenopathy   Chest:   clear to auscultation, symmetrical breath sounds, no wheezes, no rhonchi   CV:   RRR, grade III/VI soft holosystolic murmur heard best at apex,  no diastolic murmur  Abdomen:  soft, non-tender, no masses   Extremities:  warm, well-perfused, pulses palpable, no LE  edema  Rectal/GU  Deferred  Neuro:   Grossly non-focal and symmetrical throughout  Skin:   Clean and dry, no rashes, no breakdown   Diagnostic Tests:   TRANSESOPHOGEAL ECHO REPORT       Patient Name:  [place] [name] Date of Exam: 10/07/2019  Medical Rec #: [id number]     Height:    75.5 in  Accession #:  [id number]    Weight:    159.0 lb  Date of Birth: 01-Feb-1956     BSA:     2.000 m  Patient Age:  19 years     BP:      144/57 mmHg  Patient Gender: M         HR:      68 bpm.  Exam Location: Inpatient   Procedure: Transesophageal Echo, Cardiac Doppler and Color Doppler   Indications:   Mitral regurgitation    History:     Patient has prior history of Echocardiogram examinations,  most          recent 08/18/2019. Mitral Valve Disease; Risk  Factors:Former          Smoker and Hypertension.    Sonographer:   Clayton Lefort RDCS (AE)  Referring Phys: [id number] Dorothy Spark  Diagnosing Phys: [name] MD   PROCEDURE: After discussion of the risks and benefits of a TEE, an  informed consent was obtained from the patient. The transesophogeal probe  was passed without difficulty through the esophogus of the patient. Local  oropharyngeal anesthetic was provided  with Cetacaine. Sedation performed by performing physician. Patients was  under conscious sedation during this procedure. Anesthetic administered:  176mcg of Fentanyl, 8mg  of Versed. Image quality was adequate. The  patient's vital signs; including heart  rate, blood pressure, and oxygen saturation; remained stable throughout  the procedure. The patient  developed no complications during the  procedure.   IMPRESSIONS    1. There is 4+ primary mitral regurgitation with myxomatous anterior  mitral valve leaflet, with prolapse of all three scallops. The jet is  posteriorly directed, eccentric with reversal of pulmonary vein  forward  flow bilateraly. PISA radius 1.0 cm, ERO  0.45 cm2, regurgitation volume 58 ml. Posterior leaflet measures 1.0 cm.  Flail gap 0.3 cm.  2. Left ventricular ejection fraction, by estimation, is 60 to 65%. The  left ventricle has normal function. The left ventricle has no regional  wall motion abnormalities.  3. Right ventricular systolic function is normal. The right ventricular  size is normal. There is normal pulmonary artery systolic pressure.  4. Left atrial size was moderately dilated. No left atrial/left atrial  appendage thrombus was detected. The LAA emptying velocity was 40 cm/s.  5. Right atrial size was mildly dilated.  6. The mitral valve is myxomatous. No evidence of mitral valve  regurgitation. No evidence of mitral stenosis.  7. Tricuspid valve regurgitation is moderate.  8. The aortic valve is normal in structure. Aortic valve regurgitation is  trivial. No aortic stenosis is present.  9. There is moderate dilatation at the level of the sinuses of Valsalva  measuring 48 mm. There is mild (Grade II) plaque.  10. The inferior vena cava is normal in size with greater than 50%  respiratory variability, suggesting right atrial pressure of 3 mmHg.   Conclusion(s)/Recommendation(s): Normal biventricular function without  evidence of hemodynamically significant valvular heart disease.   FINDINGS  Left Ventricle: Left ventricular ejection fraction, by estimation, is 60  to 65%. The left ventricle has normal function. The left ventricle has no  regional wall motion abnormalities. The left ventricular internal cavity  size was normal in size. There is  no left ventricular hypertrophy.   Right Ventricle: The right ventricular size is normal. No increase in  right ventricular wall thickness. Right ventricular systolic function is  normal. There is normal pulmonary artery systolic pressure. The tricuspid  regurgitant velocity is 2.37 m/s, and  with an assumed  right atrial pressure of 3 mmHg, the estimated right  ventricular systolic pressure is 61.9 mmHg.   Left Atrium: Left atrial size was moderately dilated. No left atrial/left  atrial appendage thrombus was detected. The LAA emptying velocity was 40  cm/s.   Right Atrium: Right atrial size was mildly dilated.   Pericardium: There is no evidence of pericardial effusion.   Mitral Valve: The mitral valve is myxomatous. There is severe holosystolic  prolapse of multiple segments of the anterior leaflet of the mitral valve.  There is severe thickening of the mitral valve leaflet(s). Normal mobility  of the mitral valve leaflets.  No evidence of mitral valve regurgitation. No evidence of mitral valve  stenosis.   Tricuspid Valve: The tricuspid valve is normal in structure. Tricuspid  valve regurgitation is moderate . No evidence of tricuspid stenosis.   Aortic Valve: The aortic valve is normal in structure. Aortic valve  regurgitation is trivial. No aortic stenosis is present.   Pulmonic Valve: The pulmonic valve was normal in structure. Pulmonic valve  regurgitation is not visualized. No evidence of pulmonic stenosis.   Aorta: The ascending aorta was not well visualized. There is moderate  dilatation at the level of the sinuses of Valsalva measuring 48 mm. There  is mild (Grade II) plaque.   Venous: The inferior vena cava is normal in size with greater than 50%  respiratory  variability, suggesting right atrial pressure of 3 mmHg.   IAS/Shunts: No atrial level shunt detected by color flow Doppler.     MR Peak grad:  117.5 mmHg TRICUSPID VALVE  MR Mean grad:  68.0 mmHg  TR Peak grad:  22.5 mmHg  MR Vmax:     542.00 cm/s TR Vmax:    237.00 cm/s  MR Vmean:    369.0 cm/s  MR PISA:     6.28 cm  MR PISA Eff ROA: 45 mm  MR PISA Radius: 1.00 cm   [name] MD  Electronically signed by [name] MD  Signature Date/Time: 10/07/2019/1:31:59 PM        ECHOCARDIOGRAM REPORT       Patient Name:  [place] [name] Date of Exam: 08/18/2019  Medical Rec #: [id number]     Height:    75.5 in  Accession #:  [id number]    Weight:    151.6 lb  Date of Birth: 1955-09-08     BSA:     1.960 m  Patient Age:  59 years     BP:      118/68 mmHg  Patient Gender: M         HR:      74 bpm.  Exam Location: [facility]   Procedure: 2D Echo, 3D Echo, Cardiac Doppler, Color Doppler and Strain  Analysis   Indications:  I34 Mitral regurgitation    History:    Patient has prior history of Echocardiogram examinations,  most         recent 06/25/2018. Risk Factors:Hypertension and Former  Smoker.         AAA. Lung cancer.    Sonographer:  Jessee Avers, RDCS  Referring Phys: [id number] [place]    1. Left ventricular ejection fraction, by estimation, is 60 to 65%. The  left ventricle has normal function. The left ventricle has no regional  wall motion abnormalities. There is mild left ventricular hypertrophy.  Left ventricular diastolic parameters  are consistent with Grade I diastolic dysfunction (impaired relaxation).  2. Right ventricular systolic function is normal. The right ventricular  size is normal. There is severely elevated pulmonary artery systolic  pressure. The estimated right ventricular systolic pressure is 12.4 mmHg.  3. Left atrial size was moderately dilated.  4. The mitral valve is myxomatous. Severe mitral valve regurgitation.  5. The aortic valve is tricuspid. Aortic valve regurgitation is not  visualized.  6. Aortic dilatation noted. Aneurysm of the ascending aorta, measuring 40  mm. There is moderate to severe dilatation at the level of the sinuses of  Valsalva measuring 49 mm.  7. The inferior vena cava is normal in size with <50% respiratory  variability, suggesting right atrial pressure of 8 mmHg.    Comparison(s): 06/25/18 EF 60-65%. Severe MR. RVSP 29 mmHg. Sinus of Valsava  measured 3.9 cm on the last study, but now measures 4.9 cm. This  previously measured 4.8 cm in 2019. The ascending aorta is stable.   FINDINGS  Left Ventricle: Left ventricular ejection fraction, by estimation, is 60  to 65%. The left ventricle has normal function. The left ventricle has no  regional wall motion abnormalities. The left ventricular internal cavity  size was normal in size. There is  mild left ventricular hypertrophy. Left ventricular diastolic parameters  are consistent with Grade I diastolic dysfunction (impaired relaxation).  Indeterminate filling pressures.   Right Ventricle: The right ventricular size is normal. No  increase in  right ventricular wall thickness. Right ventricular systolic function is  normal. There is severely elevated pulmonary artery systolic pressure. The  tricuspid regurgitant velocity is  3.61 m/s, and with an assumed right atrial pressure of 8 mmHg, the  estimated right ventricular systolic pressure is 29.5 mmHg.   Left Atrium: Left atrial size was moderately dilated.   Right Atrium: Right atrial size was normal in size.   Pericardium: There is no evidence of pericardial effusion.   Mitral Valve: The mitral valve is myxomatous. There is severe holosystolic  prolapse of multiple segments of the anterior leaflet of the mitral valve.  There is severe thickening of the anterior mitral valve leaflet(s). Severe  mitral valve regurgitation,  with eccentric posteriorly directed jet.   Tricuspid Valve: The tricuspid valve is grossly normal. Tricuspid valve  regurgitation is mild.   Aortic Valve: The aortic valve is tricuspid. Aortic valve regurgitation is  not visualized.   Pulmonic Valve: The pulmonic valve was normal in structure. Pulmonic valve  regurgitation is not visualized.   Aorta: Aortic dilatation noted. There is moderate to severe dilatation at  the  level of the sinuses of Valsalva measuring 49 mm. There is an aneurysm  involving the ascending aorta. The aneurysm measures 40 mm.   Venous: The inferior vena cava is normal in size with less than 50%  respiratory variability, suggesting right atrial pressure of 8 mmHg.   IAS/Shunts: No atrial level shunt detected by color flow Doppler.     LEFT VENTRICLE  PLAX 2D  LVIDd:     5.20 cm Diastology  LVIDs:     3.50 cm LV e' lateral:  7.51 cm/s  LV PW:     1.10 cm LV E/e' lateral: 12.4  LV IVS:    1.20 cm LV e' medial:  6.53 cm/s  LVOT diam:   2.30 cm LV E/e' medial: 14.3  LV SV:     52  LV SV Index:  26    2D Longitudinal Strain  LVOT Area:   4.15 cm 2D Strain GLS (A2C):  -31.1 %             2D Strain GLS (A3C):  -22.1 %             2D Strain GLS (A4C):  -21.1 %             2D Strain GLS Avg:   -24.8 %               3D Volume EF:             3D EF:    60 %             LV EDV:    217 ml             LV ESV:    87 ml             LV SV:    130 ml   RIGHT VENTRICLE  RV Basal diam: 2.90 cm  RV S prime:   12.70 cm/s  TAPSE (M-mode): 2.5 cm  RVSP:      60.1 mmHg   LEFT ATRIUM       Index    RIGHT ATRIUM      Index  LA diam:    4.40 cm 2.24 cm/m RA Pressure: 8.00 mmHg  LA Vol (A2C):  82.8 ml 42.24 ml/m RA Area:   10.10 cm  LA Vol (A4C):  68.0  ml 34.69 ml/m RA Volume:  20.60 ml 10.51 ml/m  LA Biplane Vol: 81.1 ml 41.37 ml/m  AORTIC VALVE  LVOT Vmax:  75.00 cm/s  LVOT Vmean: 51.250 cm/s  LVOT VTI:  0.124 m    AORTA  Ao Root diam: 4.90 cm  Ao Asc diam: 4.00 cm   MITRAL VALVE         TRICUSPID VALVE                TR Peak grad:  52.1 mmHg                TR Vmax:    361.00 cm/s  MR Peak grad:  173.7 mmHg Estimated RAP: 8.00 mmHg   MR Mean grad:  94.0 mmHg  RVSP:      60.1 mmHg  MR Vmax:     659.00 cm/s  MR Vmean:    439.0 cm/s SHUNTS  MR PISA:     5.09 cm  Systemic VTI: 0.12 m  MR PISA Eff ROA: 30 mm   Systemic Diam: 2.30 cm  MR PISA Radius: 0.90 cm  MV E velocity: 93.10 cm/s  MV A velocity: 103.00 cm/s  MV E/A ratio: 0.90   Lyman Bishop MD  Electronically signed by Lyman Bishop MD  Signature Date/Time: 08/18/2019/9:55:44 PM     CT CHEST, ABDOMEN, AND PELVIS WITH CONTRAST  TECHNIQUE: Multidetector CT imaging of the chest, abdomen and pelvis was performed following the standard protocol during bolus administration of intravenous contrast.  CONTRAST:  111mL OMNIPAQUE IOHEXOL 300 MG/ML  SOLN  COMPARISON:  Most recent CT chest, abdomen and pelvis 07/28/2019. 11/12/2017 PET-CT.  FINDINGS: CT CHEST FINDINGS  Cardiovascular: The heart size is normal. No substantial pericardial effusion. Ascending thoracic aorta measures 4 cm diameter. Right Port-A-Cath tip is positioned in the distal SVC. Distal transverse aorta measures 4 cm diameter.  Mediastinum/Nodes: No mediastinal lymphadenopathy. There is no hilar lymphadenopathy. The esophagus has normal imaging features. There is no axillary lymphadenopathy.  Lungs/Pleura: Bilateral calcified pleural plaques again noted. Centrilobular and paraseptal emphysema evident. 4 mm anterior right upper lobe nodule on 76/4 is stable. 5 mm nodule posterior right costophrenic sulcus on 145/4 is unchanged. Surgical changes again noted in the left lung. Tiny chronic pleural effusions versus pleural thickening noted inferior hemithorax bilaterally.  Musculoskeletal: No worrisome lytic or sclerotic osseous abnormality.  CT ABDOMEN PELVIS FINDINGS  Hepatobiliary: Stable 9 mm hypoattenuating lesion in the medial segment left liver along the falciform ligament, potentially focal fatty deposition. Other scattered tiny  hypodensities in the liver parenchyma are stable and too small to characterize, but likely benign. Gallbladder is nondistended. No intrahepatic or extrahepatic biliary dilation.  Pancreas: No focal mass lesion. No dilatation of the main duct. No intraparenchymal cyst. No peripancreatic edema.  Spleen:  No splenomegaly. No focal mass lesion.  Adrenals/Urinary Tract: No adrenal nodule or mass. Right kidney unremarkable. Tiny subcapsular low-density lesions in the left kidney are too small to characterize, but stable in the interval. No evidence for hydroureter. The urinary bladder appears normal for the degree of distention.  Stomach/Bowel: Stomach is unremarkable. No gastric wall thickening. No evidence of outlet obstruction. Duodenum is normally positioned as is the ligament of Treitz. No small bowel wall thickening. No small bowel dilatation. The terminal ileum is normal. The appendix is normal. No gross colonic mass. No colonic wall thickening.  Vascular/Lymphatic: Status post aortic endograft placement. There is no gastrohepatic or hepatoduodenal ligament lymphadenopathy. No retroperitoneal or mesenteric lymphadenopathy. No pelvic  sidewall lymphadenopathy.  Reproductive: Prostate gland is enlarged.  Other: No intraperitoneal free fluid.  Musculoskeletal: No worrisome lytic or sclerotic osseous abnormality. Degenerative changes noted L4-5.  IMPRESSION: 1. Stable exam. No new or progressive findings in the chest, abdomen, or pelvis to suggest recurrent or metastatic disease. 2. Stable tiny right pulmonary nodules. Continued attention on follow-up recommended. 3. Ascending and distal transverse aorta measure up to 4 cm diameter. This could be reassessed at the time of follow-up. 4. Prostatomegaly. 5. Aortic Atherosclerosis (ICD10-I70.0) and Emphysema (ICD10-J43.9).   Electronically Signed   By: Misty Stanley M.D.   On: 10/21/2019 11:39   NUCLEAR MEDICINE  PET SKULL BASE TO THIGH  TECHNIQUE: 7.7 mCi F-18 FDG was injected intravenously. Full-ring PET imaging was performed from the skull base to thigh after the radiotracer. CT data was obtained and used for attenuation correction and anatomic localization.  Fasting blood glucose: 150 mg/dl  COMPARISON:  03/07/2016  FINDINGS: Mediastinal blood pool activity: SUV max 2.1  NECK: Diffuse uptake in the parotid glands noted bilaterally, left greater than right.  Incidental CT findings: none  CHEST: The bilateral pulmonary nodules are hypermetabolic with SUV max = 3 up to SUV max = 8.  Incidental CT findings: none  ABDOMEN/PELVIS: No abnormal hypermetabolic activity within the liver, pancreas, adrenal glands, or spleen. No hypermetabolic lymph nodes in the abdomen or pelvis.  Incidental CT findings: Status post aortic endograft placement.  SKELETON: 1.4 x 2.5 cm destructive lesion in the posterior elements of the T4 vertebral body demonstrates SUV max = 9.8. Associated pathologic fracture evident.  Incidental CT findings: none  IMPRESSION: 1. Bilateral pulmonary nodules are hypermetabolic consistent with metastatic disease. 2. 2.5 cm destructive lesion posterior elements of the T4 vertebral body is hypermetabolic consistent with bony metastatic involvement. 3.   Electronically Signed   By: Misty Stanley M.D.   On: 11/12/2017 13:57    Impression:  Patient has mitral valve prolapse with stage D severe symptomatic primary mitral regurgitation.  He describes a several month history of the development of symptoms of exertional shortness of breath consistent with chronic diastolic congestive heart failure, [place] Heart Association functional class II.  Patient is otherwise reasonably active physically although he does have stage IV metastatic adenocarcinoma the lung with evidence of metastatic disease to both lungs and possibly the thoracic spine.  He is  clinically doing well on long-term chemotherapy with no sign of disease progression on most recent CT imaging.  I have personally reviewed the patient's recent transesophageal and transthoracic echocardiograms, CT scans of the chest, abdomen, and pelvis, and PET CT scan performed in 2019.  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  Hemodynamic findings are consistent with severe mitral regurgitation with mildly elevated right heart pressures.  The posterior leaflet may be somewhat restricted, but appears reasonably mobile.  Left ventricular size and systolic function remains normal.  Most recent CT imaging reveals no sign of progression of known metastatic disease in the lungs or spine.  The patient does have significant aortoiliac occlusive disease with previous stent grafting of abdominal aortic aneurysm.  I agree the patient would benefit from mitral valve repair.  Risks associated with conventional surgery would be relatively low, although surgical intervention would require prolonged interruption of the patient's chemotherapy.  The patient would not be considered a candidate for femoral artery cannulation for  surgery and probably should not be considered candidate for right minithoracotomy approach for surgery.  I would be somewhat reluctant to consider this patient a candidate for surgery due to the need for significant interruption in his ongoing therapy for metastatic cancer and questions about his long-term prognosis.  The patient might be a reasonably good candidate for transcatheter edge-to-edge repair using MitraClip.   Plan:  The patient was counseled at length regarding treatment alternatives for management of severe symptomatic mitral regurgitation.  Alternative approaches such as conventional surgical mitral valve  repair or replacement with or without minimally-invasive techniques, percutaneous edge-to-edge Mitraclip repair, and continued medical therapy without intervention were compared and contrasted at length.  The risks associated with conventional surgery were discussed in detail, as were expectations for post-operative convalescence, and why I would be reluctant to consider this patient a candidate for conventional surgery.  Issues specific to Mitraclip repair were discussed including questions about long term freedom from persistent or recurrent mitral regurgitation, the potential for device migration or embolization, and other technical complications related to the procedure itself.   Long-term prognosis with medical therapy was discussed. This discussion was placed in the context of the patient's own specific clinical presentation and past medical history.  The patient desires to be referred for consultation with Dr. Burt Knack to consider feasibility of transcatheter edge-to-edge repair using MitraClip.  He will return to our office for follow-up in the future as needed.  All of his questions have been addressed.      I spent in excess of 90 minutes during the conduct of this office consultation and >50% of this time involved direct face-to-face encounter with the patient for counseling and/or coordination of their care.     Valentina Gu. Roxy Manns, MD 01/07/2020 11:15 AM

## 2020-01-07 NOTE — Patient Instructions (Addendum)
Continue all previous medications without any changes at this time  

## 2020-01-12 ENCOUNTER — Telehealth: Payer: Self-pay | Admitting: Medical Oncology

## 2020-01-12 ENCOUNTER — Ambulatory Visit (HOSPITAL_COMMUNITY): Payer: No Typology Code available for payment source

## 2020-01-12 NOTE — Telephone Encounter (Signed)
CT scan Location-LVM on [facility]ly's phone to tell pt to call [employer] @ [phone] and tell them he wants to take his scan at [facility]  Case ID [id number].

## 2020-01-13 ENCOUNTER — Telehealth: Payer: Self-pay | Admitting: Medical Oncology

## 2020-01-13 NOTE — Telephone Encounter (Signed)
Pt confirmed ct appt.

## 2020-01-14 ENCOUNTER — Inpatient Hospital Stay: Payer: No Typology Code available for payment source

## 2020-01-14 ENCOUNTER — Encounter: Payer: Self-pay | Admitting: Internal Medicine

## 2020-01-14 ENCOUNTER — Inpatient Hospital Stay (HOSPITAL_BASED_OUTPATIENT_CLINIC_OR_DEPARTMENT_OTHER): Payer: No Typology Code available for payment source | Admitting: Internal Medicine

## 2020-01-14 ENCOUNTER — Other Ambulatory Visit: Payer: Self-pay

## 2020-01-14 VITALS — BP 144/73 | HR 68 | Temp 97.9°F | Resp 18 | Ht 75.5 in | Wt 157.0 lb

## 2020-01-14 DIAGNOSIS — C3492 Malignant neoplasm of unspecified part of left bronchus or lung: Secondary | ICD-10-CM

## 2020-01-14 DIAGNOSIS — Z5112 Encounter for antineoplastic immunotherapy: Secondary | ICD-10-CM | POA: Diagnosis not present

## 2020-01-14 DIAGNOSIS — Z5111 Encounter for antineoplastic chemotherapy: Secondary | ICD-10-CM

## 2020-01-14 DIAGNOSIS — C3432 Malignant neoplasm of lower lobe, left bronchus or lung: Secondary | ICD-10-CM

## 2020-01-14 DIAGNOSIS — Z95828 Presence of other vascular implants and grafts: Secondary | ICD-10-CM

## 2020-01-14 LAB — CBC WITH DIFFERENTIAL (CANCER CENTER ONLY)
Abs Immature Granulocytes: 0.05 10*3/uL (ref 0.00–0.07)
Basophils Absolute: 0.1 10*3/uL (ref 0.0–0.1)
Basophils Relative: 1 %
Eosinophils Absolute: 0.2 10*3/uL (ref 0.0–0.5)
Eosinophils Relative: 3 %
HCT: 35.5 % — ABNORMAL LOW (ref 39.0–52.0)
Hemoglobin: 11.7 g/dL — ABNORMAL LOW (ref 13.0–17.0)
Immature Granulocytes: 1 %
Lymphocytes Relative: 16 %
Lymphs Abs: 1.2 10*3/uL (ref 0.7–4.0)
MCH: 33.8 pg (ref 26.0–34.0)
MCHC: 33 g/dL (ref 30.0–36.0)
MCV: 102.6 fL — ABNORMAL HIGH (ref 80.0–100.0)
Monocytes Absolute: 1.5 10*3/uL — ABNORMAL HIGH (ref 0.1–1.0)
Monocytes Relative: 20 %
Neutro Abs: 4.5 10*3/uL (ref 1.7–7.7)
Neutrophils Relative %: 59 %
Platelet Count: 443 10*3/uL — ABNORMAL HIGH (ref 150–400)
RBC: 3.46 MIL/uL — ABNORMAL LOW (ref 4.22–5.81)
RDW: 15.6 % — ABNORMAL HIGH (ref 11.5–15.5)
WBC Count: 7.5 10*3/uL (ref 4.0–10.5)
nRBC: 0 % (ref 0.0–0.2)

## 2020-01-14 LAB — CMP (CANCER CENTER ONLY)
ALT: 12 U/L (ref 0–44)
AST: 27 U/L (ref 15–41)
Albumin: 2.9 g/dL — ABNORMAL LOW (ref 3.5–5.0)
Alkaline Phosphatase: 92 U/L (ref 38–126)
Anion gap: 3 — ABNORMAL LOW (ref 5–15)
BUN: 10 mg/dL (ref 8–23)
CO2: 25 mmol/L (ref 22–32)
Calcium: 8.7 mg/dL — ABNORMAL LOW (ref 8.9–10.3)
Chloride: 105 mmol/L (ref 98–111)
Creatinine: 1.37 mg/dL — ABNORMAL HIGH (ref 0.61–1.24)
GFR, Est AFR Am: >60 mL/min (ref >60)
GFR, Estimated: 53 mL/min — ABNORMAL LOW (ref >60)
Glucose, Bld: 96 mg/dL (ref 70–99)
Potassium: 3.9 mmol/L (ref 3.5–5.1)
Sodium: 133 mmol/L — ABNORMAL LOW (ref 135–145)
Total Bilirubin: 0.4 mg/dL (ref 0.3–1.2)
Total Protein: 6.5 g/dL (ref 6.5–8.1)

## 2020-01-14 LAB — TSH: TSH: 2.771 u[IU]/mL (ref 0.320–4.118)

## 2020-01-14 MED ORDER — SODIUM CHLORIDE 0.9 % IV SOLN
10.0000 mg | Freq: Once | INTRAVENOUS | Status: AC
  Administered 2020-01-14: 10 mg via INTRAVENOUS
  Filled 2020-01-14: qty 10

## 2020-01-14 MED ORDER — SODIUM CHLORIDE 0.9% FLUSH
10.0000 mL | Freq: Once | INTRAVENOUS | Status: AC
  Administered 2020-01-14: 10 mL
  Filled 2020-01-14: qty 10

## 2020-01-14 MED ORDER — ONDANSETRON HCL 4 MG/2ML IJ SOLN
8.0000 mg | Freq: Once | INTRAMUSCULAR | Status: AC
  Administered 2020-01-14: 8 mg via INTRAVENOUS

## 2020-01-14 MED ORDER — ONDANSETRON HCL 4 MG/2ML IJ SOLN
INTRAMUSCULAR | Status: AC
  Filled 2020-01-14: qty 4

## 2020-01-14 MED ORDER — SODIUM CHLORIDE 0.9 % IV SOLN
Freq: Once | INTRAVENOUS | Status: AC
  Filled 2020-01-14: qty 250

## 2020-01-14 MED ORDER — SODIUM CHLORIDE 0.9% FLUSH
10.0000 mL | INTRAVENOUS | Status: DC | PRN
  Administered 2020-01-14: 10 mL
  Filled 2020-01-14: qty 10

## 2020-01-14 MED ORDER — HEPARIN SOD (PORK) LOCK FLUSH 100 UNIT/ML IV SOLN
500.0000 [IU] | Freq: Once | INTRAVENOUS | Status: AC | PRN
  Administered 2020-01-14: 500 [IU]
  Filled 2020-01-14: qty 5

## 2020-01-14 MED ORDER — SODIUM CHLORIDE 0.9 % IV SOLN
200.0000 mg | Freq: Once | INTRAVENOUS | Status: AC
  Administered 2020-01-14: 200 mg via INTRAVENOUS
  Filled 2020-01-14: qty 8

## 2020-01-14 MED ORDER — SODIUM CHLORIDE 0.9 % IV SOLN
500.0000 mg/m2 | Freq: Once | INTRAVENOUS | Status: AC
  Administered 2020-01-14: 1000 mg via INTRAVENOUS
  Filled 2020-01-14: qty 40

## 2020-01-14 NOTE — Progress Notes (Signed)
[facility] [phone]   [phone]  OFFICE PROGRESS NOTE  [name], MD [address] [place] [address]  DIAGNOSIS: Metastatic non-small cell lung cancer, adenocarcinoma initially diagnosed as stage IB (T2a, N0, M0) non-small cell lung cancer, adenocarcinoma diagnosed in December 2017.  The patient has evidence for disease recurrence in July 2019.  Biomarker Findings Tumor Mutational Burden - TMB-Intermediate (16 Muts/Mb) Microsatellite status - MS-Stable Genomic Findings For a complete list of the genes assayed, please refer to the Appendix. KIT amplification KRAS O29U SMAD4 splice site [id number]>K PT46 I232F 7 Disease relevant genes with no reportable alterations: EGFR, ALK, BRAF, MET, RET, ERBB2, ROS1   PDL 1 expression is 0%  PRIOR THERAPY:  1) status post left lower lobectomy as well as wedge resection of the left upper lobe on 05/11/2016. 2) Adjuvant systemic chemotherapy with cisplatin 75 MG/M2 and Alimta 500 MG/M2 every 3 weeks. First dose 07/03/2016. Status post 4 cycles.  CURRENT THERAPY: Systemic chemotherapy with carboplatin for AUC of 5, Alimta 500 mg/M2 and Keytruda 200 mg IV every 3 weeks.  Status post 35 cycles.  Starting from cycle #5 the patient will be treated with maintenance Alimta and Ketruda (pembrolizumab) every 3 weeks.  INTERVAL HISTORY: William Kales [name] 64 y.o. male returns to the clinic today for follow-up visit.  The patient is feeling fine today with no concerning complaints.  He continues to tolerate his maintenance systemic chemotherapy with Alimta and Keytruda fairly well.  He denied having any chest pain, shortness of breath, cough or hemoptysis.  He denied having any fever or chills.  He has no nausea, vomiting, diarrhea or constipation.  He denied having any headache or visual changes.  He was supposed to have repeat CT scan of the chest before this visit but unfortunately it scheduled to be  done tomorrow.  The patient is here today for evaluation before starting cycle #36.   MEDICAL HISTORY: Past Medical History:  Diagnosis Date  . AAA (abdominal aortic aneurysm) ([place])   . AAA (abdominal aortic aneurysm) without rupture ([place]) 02/04/2014  . Cancer ([place])    lung  . Encounter for antineoplastic chemotherapy 06/06/2016  . History of hepatitis B   . HNP (herniated nucleus pulposus), lumbar    L4 with radiculopathy  . Hypertension   . Lung cancer ([place]) 05/18/2016  . Malignant neoplasm of lower lobe of left lung ([place]) 04/05/2016  . Mass of left lung   . Mitral insufficiency 04/06/2016   This patient will eventually need MV repair if his prognosis is good from oncology standpoint. However, his lung cancer therapy  is the priority right now. His cardiac condition won't preclude possible lung surgery.  Once his lung cancer is under control he will need a TEE to further evaluate his mitral valve anatomy and MR severity, and also have ischemic workup as tere is evidence on calcificati  . Renal insufficiency 10/09/2016  . S/P partial lobectomy of lung 05/11/2016  . Varicose veins of legs     ALLERGIES:  is allergic to tramadol.  MEDICATIONS:  Current Outpatient Medications  Medication Sig Dispense Refill  . carvedilol (COREG) 6.25 MG tablet Take 1 tablet (6.25 mg total) by mouth 2 (two) times daily. 568 tablet 3  . folic acid (FOLVITE) 1 MG tablet Take 1 tablet (1 mg total) by mouth daily. 90 tablet 1  . lidocaine-prilocaine (EMLA) cream Apply 1 application topically as needed.  Beginning may June 1st   apply a small amount of emla cream  to a cotton ball and apply over port site 1-2  Hours prior to chemotherapy . Do not rub in cream . Cover with plastic wrap. 30 g 0  . lisinopril (ZESTRIL) 2.5 MG tablet TAKE 1 TABLET BY MOUTH EVERY DAY 90 tablet 1   No current facility-administered medications for this visit.    SURGICAL HISTORY:  Past Surgical History:  Procedure Laterality Date   . ABDOMINAL AORTIC ENDOVASCULAR STENT GRAFT N/A 03/12/2016   Procedure: ABDOMINAL AORTIC ENDOVASCULAR STENT GRAFT;  Surgeon: Conrad Gholson, MD;  Location: [facility];  Service: Vascular;  Laterality: N/A;  . COLONOSCOPY    . INGUINAL HERNIA REPAIR Left 1975   Left inguinal hernia  . INGUINAL HERNIA REPAIR Right   . IR IMAGING GUIDED PORT INSERTION  09/04/2019  . LOBECTOMY Left 05/11/2016   Procedure: LEFT LOWER LOBE LOBECTOMY AND LEFT UPPER LOBE  RESECTION AND PLACEMENT OF ON-Q;  Surgeon: [name], MD;  Location: [place] [facility];  Service: Thoracic;  Laterality: Left;  . LUMBAR LAMINECTOMY  October 22, 2012  . LYMPH NODE DISSECTION Left 05/11/2016   Procedure: LYMPH NODE DISSECTION;  Surgeon: [name], MD;  Location: [facility];  Service: Thoracic;  Laterality: Left;  . STAPLING OF BLEBS Left 05/11/2016   Procedure: STAPLING OF APICAL BLEB;  Surgeon: [name], MD;  Location: [facility];  Service: Thoracic;  Laterality: Left;  . TEE WITHOUT CARDIOVERSION N/A 10/07/2019   Procedure: TRANSESOPHAGEAL ECHOCARDIOGRAM (TEE);  Surgeon: Dorothy Spark, MD;  Location: [facility];  Service: Cardiovascular;  Laterality: N/A;  . VIDEO ASSISTED THORACOSCOPY Left 05/18/2016   Procedure: VIDEO ASSISTED THORACOSCOPY WITH REMOVAL OF LEFT APICAL BLEB;  Surgeon: [name], MD;  Location: [facility];  Service: Thoracic;  Laterality: Left;  Marland Kitchen VIDEO ASSISTED THORACOSCOPY (VATS)/WEDGE RESECTION Left 05/11/2016   Procedure: LEFT VIDEO ASSISTED THORACOSCOPY (VATS);  Surgeon: [name], MD;  Location: [facility];  Service: Thoracic;  Laterality: Left;  Marland Kitchen VIDEO BRONCHOSCOPY N/A 05/11/2016   Procedure: VIDEO BRONCHOSCOPY, LEFT LUNG;  Surgeon: [name], MD;  Location: [facility] [facility];  Service: Thoracic;  Laterality: N/A;  . VIDEO BRONCHOSCOPY N/A 05/18/2016   Procedure: VIDEO BRONCHOSCOPY WITH BRONCHIAL WASHING;  Surgeon: [name], MD;  Location: [facility];  Service: Thoracic;  Laterality: N/A;    REVIEW OF  SYSTEMS:  A comprehensive review of systems was negative.   PHYSICAL EXAMINATION: General appearance: alert, cooperative and no distress Head: Normocephalic, without obvious abnormality, atraumatic Neck: no adenopathy, no JVD, supple, symmetrical, trachea midline and thyroid not enlarged, symmetric, no tenderness/mass/nodules Lymph nodes: Cervical, supraclavicular, and axillary nodes normal. Resp: clear to auscultation bilaterally Back: symmetric, no curvature. ROM normal. No CVA tenderness. Cardio: regular rate and rhythm, S1, S2 normal, no murmur, click, rub or gallop GI: soft, non-tender; bowel sounds normal; no masses,  no organomegaly Extremities: extremities normal, atraumatic, no cyanosis or edema   ECOG PERFORMANCE STATUS: 1 - Symptomatic but completely ambulatory  Blood pressure (!) 144/73, pulse 68, temperature 97.9 F (36.6 C), temperature source Tympanic, resp. rate 18, height 6' 3.5" (1.918 m), weight 157 lb (71.2 kg), SpO2 91 %.  LABORATORY DATA: Lab Results  Component Value Date   WBC 7.5 01/14/2020   HGB 11.7 (L) 01/14/2020   HCT 35.5 (L) 01/14/2020   MCV 102.6 (H) 01/14/2020   PLT 443 (H) 01/14/2020      Chemistry  Component Value Date/Time   NA 134 (L) 12/24/2019 0954   NA 132 (L) 10/05/2019 0830   NA 138 01/11/2017 1343   K 4.4 12/24/2019 0954   K 4.7 01/11/2017 1343   CL 104 12/24/2019 0954   CO2 23 12/24/2019 0954   CO2 23 01/11/2017 1343   BUN 13 12/24/2019 0954   BUN 14 10/05/2019 0830   BUN 20.2 01/11/2017 1343   CREATININE 1.45 (H) 12/24/2019 0954   CREATININE 1.6 (H) 01/11/2017 1343      Component Value Date/Time   CALCIUM 9.5 12/24/2019 0954   CALCIUM 9.3 01/11/2017 1343   ALKPHOS 87 12/24/2019 0954   ALKPHOS 89 01/11/2017 1343   AST 23 12/24/2019 0954   AST 41 (H) 01/11/2017 1343   ALT 10 12/24/2019 0954   ALT 27 01/11/2017 1343   BILITOT 0.4 12/24/2019 0954   BILITOT 0.49 01/11/2017 1343       RADIOGRAPHIC STUDIES: No  results found.  ASSESSMENT AND PLAN:  This is a very pleasant 64 years old white male with a stage IB non-small cell lung cancer, adenocarcinoma status post wedge resection of the left upper lobe followed by 4 cycles of adjuvant systemic chemotherapy with cisplatin and Alimta and he tolerated his treatment well except for fatigue. The patient has been on observation since June 2018.   The recent imaging studies including CT scan of the chest as well as a PET scan showed evidence for disease recurrence with metastatic disease presented with bilateral pulmonary nodules as well as destructive bone lesions at T4 vertebral body, biopsy proven to be metastatic adenocarcinoma. Molecular studies by foundation 1 showed no actionable mutations.  PDL 1 expression was negative. The patient is currently undergoing treatment with carboplatin, Alimta and Ketruda (pembrolizumab) status post 35 cycles.  Starting from cycle #5 he is on maintenance treatment with Alimta and Keytruda every 3 weeks. He continues to tolerate his treatment well with no concerning adverse effects.  I recommended for the patient to proceed with cycle #36 today as planned. He will come back for follow-up visit in 3 weeks for evaluation before the next cycle of his treatment and he is scheduled to have repeat CT scan of the chest, abdomen pelvis performed tomorrow for restaging of his disease.  I will call the patient if there is any concerning abnormalities. He was advised to call immediately if he has any concerning symptoms in the interval. The patient voices understanding of current disease status and treatment options and is in agreement with the current care plan. All questions were answered. The patient knows to call the clinic with any problems, questions or concerns. We can certainly see the patient much sooner if necessary. Disclaimer: This note was dictated with voice recognition software. Similar sounding words can inadvertently be  transcribed and may be missed upon review. Eilleen Kempf, MD 01/14/20

## 2020-01-14 NOTE — Patient Instructions (Signed)
[facility] Discharge Instructions for Patients Receiving Chemotherapy  Today you received the following chemotherapy agents: Keytruda, Alimta  To help prevent nausea and vomiting after your treatment, we encourage you to take your nausea medication as directed.    If you develop nausea and vomiting that is not controlled by your nausea medication, call the clinic.   BELOW ARE SYMPTOMS THAT SHOULD BE REPORTED IMMEDIATELY:  *FEVER GREATER THAN 100.5 F  *CHILLS WITH OR WITHOUT FEVER  NAUSEA AND VOMITING THAT IS NOT CONTROLLED WITH YOUR NAUSEA MEDICATION  *UNUSUAL SHORTNESS OF BREATH  *UNUSUAL BRUISING OR BLEEDING  TENDERNESS IN MOUTH AND THROAT WITH OR WITHOUT PRESENCE OF ULCERS  *URINARY PROBLEMS  *BOWEL PROBLEMS  UNUSUAL RASH Items with * indicate a potential emergency and should be followed up as soon as possible.  Feel free to call the clinic should you have any questions or concerns. The clinic phone number is [phone].  Please show the [facility] at check-in to the Emergency Department and triage nurse.

## 2020-01-15 ENCOUNTER — Ambulatory Visit (HOSPITAL_COMMUNITY)
Admission: RE | Admit: 2020-01-15 | Discharge: 2020-01-15 | Disposition: A | Discharge status: Home / Self Care | Payer: No Typology Code available for payment source | Source: Ambulatory Visit | Attending: Internal Medicine | Admitting: Internal Medicine

## 2020-01-15 DIAGNOSIS — C3490 Malignant neoplasm of unspecified part of unspecified bronchus or lung: Secondary | ICD-10-CM | POA: Insufficient documentation

## 2020-01-15 MED ORDER — HEPARIN SOD (PORK) LOCK FLUSH 100 UNIT/ML IV SOLN
INTRAVENOUS | Status: AC
  Filled 2020-01-15: qty 5

## 2020-01-15 MED ORDER — IOPAMIDOL (ISOVUE-300) INJECTION 61%
100.0000 mL | Freq: Once | INTRAVENOUS | Status: AC | PRN
  Administered 2020-01-15: 100 mL via INTRAVENOUS

## 2020-01-15 MED ORDER — IOPAMIDOL (ISOVUE-M 300) INJECTION 61%: 15.0000 mL | Freq: Once | INTRAMUSCULAR | Status: DC | PRN

## 2020-01-25 ENCOUNTER — Ambulatory Visit (INDEPENDENT_AMBULATORY_CARE_PROVIDER_SITE_OTHER): Payer: No Typology Code available for payment source | Admitting: Cardiovascular Disease

## 2020-01-25 ENCOUNTER — Encounter: Payer: Self-pay | Admitting: Cardiovascular Disease

## 2020-01-25 ENCOUNTER — Other Ambulatory Visit: Payer: Self-pay

## 2020-01-25 VITALS — BP 100/60 | HR 92 | Ht 75.5 in | Wt 151.0 lb

## 2020-01-25 DIAGNOSIS — I340 Nonrheumatic mitral (valve) insufficiency: Secondary | ICD-10-CM | POA: Diagnosis not present

## 2020-01-25 NOTE — Patient Instructions (Signed)
Medication Instructions:  Your provider recommends that you continue on your current medications as directed. Please refer to the Current Medication list given to you today.  *If you need a refill on your cardiac medications before your next appointment, please call your pharmacy*  Testing/Procedures: Your physician has requested that you have a cardiac catheterization. Cardiac catheterization is used to diagnose and/or treat various heart conditions. Doctors may recommend this procedure for a number of different reasons. The most common reason is to evaluate chest pain. Chest pain can be a symptom of coronary artery disease (CAD), and cardiac catheterization can show whether plaque is narrowing or blocking your heart's arteries. This procedure is also used to evaluate the valves, as well as measure the blood flow and oxygen levels in different parts of your heart. For further information please visit [email or web address]. Please follow instruction sheet, as given.  Follow-Up: Here are possible cath dates: 10/15, 10/20, 10/21, 10/28, 10/29 11/10, 11/18, 11/22

## 2020-01-25 NOTE — Progress Notes (Signed)
[facility]   MULTIDISCIPLINARY HEART VALVE TEAM  Date:  01/25/2020   ID:  William Kales [name], DOB 1955/10/21, MRN [id number]  PCP:  [name], MD   Chief Complaint  Patient presents with  . Mitral Regurgitation     HISTORY OF PRESENT ILLNESS: [name]. is a 64 y.o. male who presents for evaluation of severe mitral regurgitation, referred by Dr [name].  The patient has a history of abdominal aortic aneurysm.  He was treated with endovascular stent grafting in 2017 with good result.  He was diagnosed with lung cancer and underwent left lower lobectomy and left upper lobe wedge resection in 2018.  He is now treated with chemotherapy for metastatic non-small cell lung cancer that has been clinically stable.  He has a history of mitral valve prolapse with severe symptomatic mitral regurgitation.  He has been managed conservatively around his lung cancer diagnosis, but recently underwent cardiac surgical evaluation with Dr. Roxy Barker 01/07/2020.  The patient was referred for consideration of transcatheter edge-to-edge mitral valve repair as a less invasive treatment option in the context of his lung cancer.  The patient is here alone today.  He was first diagnosed with heart murmur and 2017.  He notes progressive shortness of breath over the past 6 months.  States that he is short of breath with short distance walking.  No orthopnea, PND, chest pain, or chest pressure.  No lightheadedness, heart palpitations, or syncope.  He remains functionally independent.  He denies cough, fever, or chills.  He continues with chemotherapy on an every 3-week cycle.  Past Medical History:  Diagnosis Date  . AAA (abdominal aortic aneurysm) ([place])   . AAA (abdominal aortic aneurysm) without rupture ([place]) 02/04/2014  . Cancer ([place])    lung  . Encounter for antineoplastic chemotherapy 06/06/2016  . History of hepatitis B   . HNP (herniated nucleus pulposus), lumbar    L4 with radiculopathy  .  Hypertension   . Lung cancer ([place]) 05/18/2016  . Malignant neoplasm of lower lobe of left lung ([place]) 04/05/2016  . Mass of left lung   . Mitral insufficiency 04/06/2016   This patient will eventually need MV repair if his prognosis is good from oncology standpoint. However, his lung cancer therapy  is the priority right now. His cardiac condition won't preclude possible lung surgery.  Once his lung cancer is under control he will need a TEE to further evaluate his mitral valve anatomy and MR severity, and also have ischemic workup as tere is evidence on calcificati  . Renal insufficiency 10/09/2016  . S/P partial lobectomy of lung 05/11/2016  . Varicose veins of legs     Current Outpatient Medications  Medication Sig Dispense Refill  . carvedilol (COREG) 6.25 MG tablet Take 1 tablet (6.25 mg total) by mouth 2 (two) times daily. 683 tablet 3  . folic acid (FOLVITE) 1 MG tablet Take 1 tablet (1 mg total) by mouth daily. 90 tablet 1  . lidocaine-prilocaine (EMLA) cream Apply 1 application topically as needed. Beginning may June 1st   apply a small amount of emla cream  to a cotton ball and apply over port site 1-2  Hours prior to chemotherapy . Do not rub in cream . Cover with plastic wrap. 30 g 0  . lisinopril (ZESTRIL) 2.5 MG tablet TAKE 1 TABLET BY MOUTH EVERY DAY 90 tablet 1   No current facility-administered medications for this visit.    ALLERGIES:   Tramadol  SOCIAL HISTORY:  The patient  reports that he quit smoking about 3 years ago. His smoking use included cigarettes. He smoked 1.00 pack per day. He has never used smokeless tobacco. He reports current alcohol use of about 6.0 standard drinks of alcohol per week. He reports current drug use. Drugs: Cocaine and Heroin.   FAMILY HISTORY:  The patient's family history includes Diabetes in his mother.   REVIEW OF SYSTEMS: Positive for fatigue.  Otherwise negative except as outlined in the HPI.   PHYSICAL EXAM: VS:  BP 100/60   Pulse  92   Ht 6' 3.5" (1.918 m)   Wt 151 lb (68.5 kg)   SpO2 98%   BMI 18.62 kg/m  , BMI Body mass index is 18.62 kg/m. GEN: Well nourished, well developed, thin male in no acute distress HEENT: normal Neck: No JVD. carotids 2+ without bruits or masses Cardiac: The heart is RRR with 3/6 holosystolic murmur best heard in the left mid axillary line. No edema. Pedal pulses 2+ = bilaterally  Respiratory:  clear to auscultation bilaterally GI: soft, nontender, nondistended, + BS MS: no deformity or atrophy Skin: warm and dry, no rash Neuro:  Strength and sensation are intact Psych: euthymic mood, full affect   RECENT LABS: 01/14/2020: ALT 12; BUN 10; Creatinine 1.37; Hemoglobin 11.7; Platelet Count 443; Potassium 3.9; Sodium 133; TSH 2.771  No results found for requested labs within last 8760 hours.   Estimated Creatinine Clearance: 51.4 mL/min (A) (by C-G formula based on SCr of 1.37 mg/dL (H)).   Wt Readings from Last 3 Encounters:  01/25/20 151 lb (68.5 kg)  01/14/20 157 lb (71.2 kg)  01/07/20 157 lb (71.2 kg)     CARDIAC STUDIES: TEE: 1. There is 4+ primary mitral regurgitation with myxomatous anterior  mitral valve leaflet, with prolapse of all three scallops. The jet is  posteriorly directed, eccentric with reversal of pulmonary vein forward  flow bilateraly. PISA radius 1.0 cm, ERO  0.45 cm2, regurgitation volume 58 ml. Posterior leaflet measures 1.0 cm.  Flail gap 0.3 cm.  2. Left ventricular ejection fraction, by estimation, is 60 to 65%. The  left ventricle has normal function. The left ventricle has no regional  wall motion abnormalities.  3. Right ventricular systolic function is normal. The right ventricular  size is normal. There is normal pulmonary artery systolic pressure.  4. Left atrial size was moderately dilated. No left atrial/left atrial  appendage thrombus was detected. The LAA emptying velocity was 40 cm/s.  5. Right atrial size was mildly dilated.  6.  The mitral valve is myxomatous. No evidence of mitral valve  regurgitation. No evidence of mitral stenosis.  7. Tricuspid valve regurgitation is moderate.  8. The aortic valve is normal in structure. Aortic valve regurgitation is  trivial. No aortic stenosis is present.  9. There is moderate dilatation at the level of the sinuses of Valsalva  measuring 48 mm. There is mild (Grade II) plaque.  10. The inferior vena cava is normal in size with greater than 50%  respiratory variability, suggesting right atrial pressure of 3 mmHg.   Conclusion(s)/Recommendation(s): Normal biventricular function without  evidence of hemodynamically significant valvular heart disease.   FINDINGS  Left Ventricle: Left ventricular ejection fraction, by estimation, is 60  to 65%. The left ventricle has normal function. The left ventricle has no  regional wall motion abnormalities. The left ventricular internal cavity  size was normal in size. There is  no left ventricular hypertrophy.  Right Ventricle: The right ventricular size is normal. No increase in  right ventricular wall thickness. Right ventricular systolic function is  normal. There is normal pulmonary artery systolic pressure. The tricuspid  regurgitant velocity is 2.37 m/s, and  with an assumed right atrial pressure of 3 mmHg, the estimated right  ventricular systolic pressure is 52.8 mmHg.   Left Atrium: Left atrial size was moderately dilated. No left atrial/left  atrial appendage thrombus was detected. The LAA emptying velocity was 40  cm/s.   Right Atrium: Right atrial size was mildly dilated.   Pericardium: There is no evidence of pericardial effusion.   Mitral Valve: The mitral valve is myxomatous. There is severe holosystolic  prolapse of multiple segments of the anterior leaflet of the mitral valve.  There is severe thickening of the mitral valve leaflet(s). Normal mobility  of the mitral valve leaflets.  No evidence of mitral  valve regurgitation. No evidence of mitral valve  stenosis.   Tricuspid Valve: The tricuspid valve is normal in structure. Tricuspid  valve regurgitation is moderate . No evidence of tricuspid stenosis.   Aortic Valve: The aortic valve is normal in structure. Aortic valve  regurgitation is trivial. No aortic stenosis is present.   Pulmonic Valve: The pulmonic valve was normal in structure. Pulmonic valve  regurgitation is not visualized. No evidence of pulmonic stenosis.   Aorta: The ascending aorta was not well visualized. There is moderate  dilatation at the level of the sinuses of Valsalva measuring 48 mm. There  is mild (Grade II) plaque.   Venous: The inferior vena cava is normal in size with greater than 50%  respiratory variability, suggesting right atrial pressure of 3 mmHg.   IAS/Shunts: No atrial level shunt detected by color flow Doppler.     MR Peak grad:  117.5 mmHg TRICUSPID VALVE  MR Mean grad:  68.0 mmHg  TR Peak grad:  22.5 mmHg  MR Vmax:     542.00 cm/s TR Vmax:    237.00 cm/s  MR Vmean:    369.0 cm/s  MR PISA:     6.28 cm  MR PISA Eff ROA: 45 mm  MR PISA Radius: 1.00 cm   Echo:  1. Left ventricular ejection fraction, by estimation, is 60 to 65%. The  left ventricle has normal function. The left ventricle has no regional  wall motion abnormalities. There is mild left ventricular hypertrophy.  Left ventricular diastolic parameters  are consistent with Grade I diastolic dysfunction (impaired relaxation).  2. Right ventricular systolic function is normal. The right ventricular  size is normal. There is severely elevated pulmonary artery systolic  pressure. The estimated right ventricular systolic pressure is 41.3 mmHg.  3. Left atrial size was moderately dilated.  4. The mitral valve is myxomatous. Severe mitral valve regurgitation.  5. The aortic valve is tricuspid. Aortic valve regurgitation is not  visualized.  6. Aortic  dilatation noted. Aneurysm of the ascending aorta, measuring 40  mm. There is moderate to severe dilatation at the level of the sinuses of  Valsalva measuring 49 mm.  7. The inferior vena cava is normal in size with <50% respiratory  variability, suggesting right atrial pressure of 8 mmHg.   Comparison(s): 06/25/18 EF 60-65%. Severe MR. RVSP 29 mmHg. Sinus of Valsava  measured 3.9 cm on the last study, but now measures 4.9 cm. This  previously measured 4.8 cm in 2019. The ascending aorta is stable.   STS RISK CALCULATOR: MV Replacement: Risk of Mortality: 4.092%  Renal Failure: 6.034% Permanent Stroke: 1.852% Prolonged Ventilation: 10.995% DSW Infection: 0.096% Reoperation: 5.868% Morbidity or Mortality: 17.778% Short Length of Stay: 18.495% Long Length of Stay: 10.550%   MV Repair: Risk of Mortality: 2.101% Renal Failure: 2.630% Permanent Stroke: 1.327% Prolonged Ventilation: 6.528% DSW Infection: 0.050% Reoperation: 4.006% Morbidity or Mortality: 11.606% Short Length of Stay: 39.986% Long Length of Stay: 7.179%  ASSESSMENT AND PLAN: 31.  64 year old male with severe, stage D1 primary mitral regurgitation secondary to myxomatous degeneration of the mitral valve with prolapse of the anterior leaflet.  This is associated with New [place] Heart Association functional class II symptoms of progressive diastolic heart failure.   I have reviewed the natural history of mitral regurgitation with the patient today. We have discussed the limitations of medical therapy and the poor prognosis associated with symptomatic mitral regurgitation. We have also reviewed potential treatment options, including palliative medical therapy, conventional surgical mitral valve repair or replacement, and percutaneous mitral valve therapies such as edge-to-edge mitral valve approximation with MitraClip. We discussed treatment options in the context of this patient's specific comorbid medical  conditions.  I have personally reviewed his TEE images with findings outlined above.  He has normal LV systolic function.  He has prolapse of the anterior mitral valve leaflet causing a severe jet of mitral regurgitation wrapping around the posterior wall of the left atrium.  There is pulmonary vein flow reversal noted on his TEE.  I have reviewed his formal cardiac surgical consultation and he is not felt to be a good candidate for cardiac surgery because of his metastatic lung cancer.  However, with clinical stability of his lung cancer, it would be reasonable to consider him for a minimally invasive therapies such as transcatheter edge-to-edge mitral valve repair.  He appears to have anatomy that would be amenable to transcatheter edge-to-edge repair of the mitral valve with a posterior leaflet length of approximately 9 mm, moderately enlarged left atrium, and no evidence of mitral stenosis.  He has significant risk of coronary artery disease with history of AAA and longstanding smoking.  I have recommended preoperative right and left heart catheterization to evaluate for the presence of obstructive coronary artery disease as well as intracardiac hemodynamic assessment of mitral regurgitation. I have reviewed the risks, indications, and alternatives to cardiac catheterization, possible angioplasty, and stenting with the patient. Risks include but are not limited to bleeding, infection, vascular injury, stroke, myocardial infection, arrhythmia, kidney injury, radiation-related injury in the case of prolonged fluoroscopy use, emergency cardiac surgery, and death. The patient understands the risks of serious complication is 1-2 in 0865 with diagnostic cardiac cath and 1-2% or less with angioplasty/stenting.  As long as he does not have severe multivessel coronary artery disease, I think he would be a good candidate for transcatheter mitral valve repair with MitraClip. The patient is counseled specifically about the  risks, indications, and alternatives to percutaneous mitral valve repair with MitraClip. Specific risks include vascular injury, bleeding, infection, arrhythmia, myocardial infarction, stroke, cardiac perforation, cardiac tamponade, device embolization, single leaflet detachment, endocarditis, mitral valve injury, emergency surgery, and death. They understand the risk of serious complication occurs at a rate of approximately 2%. After this discussion, he will contact us in the near future to schedule cardiac catheterization and subsequent MitraClip.  Deatra James 01/25/2020 12:48 PM     [address] [id number]  [phone] (office) [phone] (fax)

## 2020-02-01 NOTE — Progress Notes (Signed)
[facility] OFFICE PROGRESS NOTE  [name], [address] [place] [address]  DIAGNOSIS: Metastatic non-small cell lung cancer, adenocarcinoma initially diagnosed as stage IB (T2a, N0, M0) non-small cell lung cancer, adenocarcinoma diagnosed in December 2017.  The patient has evidence for disease recurrence in July 2019.  Biomarker Findings Tumor Mutational Burden - TMB-Intermediate (16 Muts/Mb) Microsatellite status - MS-Stable Genomic Findings For a complete list of the genes assayed, please refer to the Appendix. KIT amplification KRAS R97J SMAD4 splice site 8832-5Q>D IY64 I232F 7 Disease relevant genes with no reportable alterations: EGFR, ALK, BRAF, MET, RET, ERBB2, ROS1   PDL 1 expression is 0%  PRIOR THERAPY:  1) status post left lower lobectomy as well as wedge resection of the left upper lobe on 05/11/2016. 2) Adjuvant systemic chemotherapy with cisplatin 75 MG/M2 and Alimta 500 MG/M2 every 3 weeks. First dose 07/03/2016. Status post 4 cycles.  CURRENT THERAPY: Systemic chemotherapy with carboplatin for AUC of 5, Alimta 500 mg/M2 and Keytruda 200 mg IV every 3 weeks.  Status post 36 cycles.  Starting from cycle #5 the patient will be treated with maintenance Alimta and Ketruda (pembrolizumab) every 3 weeks.  INTERVAL HISTORY: William Kales [name] 64 y.o. male returns to the clinic today for a follow-up visit.  The patient is feeling fine today with no concerning complaints except he states he developed constipation following his CT scan last month.  He continues to tolerate his maintenance systemic chemotherapy with Alimta and Keytruda fairly well.  He denied having any chest pain, shortness of breath, cough or hemoptysis.  He denied having any fever or chills.  He has no nausea, vomiting, diarrhea or constipation.  He denied having any headache or visual changes. He recently had a follow up visit with cardiology for his severe mitral valve  regurgitation. They are planning on performing a cardiac cath and mitraclip in the near future. The patient is here today for evaluation before starting cycle #37.   MEDICAL HISTORY: Past Medical History:  Diagnosis Date  . AAA (abdominal aortic aneurysm) ([place])   . AAA (abdominal aortic aneurysm) without rupture ([place]) 02/04/2014  . Cancer ([place])    lung  . Encounter for antineoplastic chemotherapy 06/06/2016  . History of hepatitis B   . HNP (herniated nucleus pulposus), lumbar    L4 with radiculopathy  . Hypertension   . Lung cancer ([place]) 05/18/2016  . Malignant neoplasm of lower lobe of left lung ([place] [place]) 04/05/2016  . Mass of left lung   . Mitral insufficiency 04/06/2016   This patient will eventually need MV repair if his prognosis is good from oncology standpoint. However, his lung cancer therapy  is the priority right now. His cardiac condition won't preclude possible lung surgery.  Once his lung cancer is under control he will need a TEE to further evaluate his mitral valve anatomy and MR severity, and also have ischemic workup as tere is evidence on calcificati  . Renal insufficiency 10/09/2016  . S/P partial lobectomy of lung 05/11/2016  . Varicose veins of legs     ALLERGIES:  is allergic to tramadol.  MEDICATIONS:  Current Outpatient Medications  Medication Sig Dispense Refill  . carvedilol (COREG) 6.25 MG tablet Take 1 tablet (6.25 mg total) by mouth 2 (two) times daily. 158 tablet 3  . folic acid (FOLVITE) 1 MG tablet Take 1 tablet (1 mg total) by mouth daily. 90 tablet 1  . lidocaine-prilocaine (EMLA) cream Apply 1  application topically as needed. Beginning may June 1st   apply a small amount of emla cream  to a cotton ball and apply over port site 1-2  Hours prior to chemotherapy . Do not rub in cream . Cover with plastic wrap. 30 g 0  . lisinopril (ZESTRIL) 2.5 MG tablet TAKE 1 TABLET BY MOUTH EVERY DAY 90 tablet 1   No current facility-administered medications for this  visit.    SURGICAL HISTORY:  Past Surgical History:  Procedure Laterality Date  . ABDOMINAL AORTIC ENDOVASCULAR STENT GRAFT N/A 03/12/2016   Procedure: ABDOMINAL AORTIC ENDOVASCULAR STENT GRAFT;  Surgeon: Conrad [place], MD;  Location: [facility];  Service: Vascular;  Laterality: N/A;  . COLONOSCOPY    . INGUINAL HERNIA REPAIR Left 1975   Left inguinal hernia  . INGUINAL HERNIA REPAIR Right   . IR IMAGING GUIDED PORT INSERTION  09/04/2019  . LOBECTOMY Left 05/11/2016   Procedure: LEFT LOWER LOBE LOBECTOMY AND LEFT UPPER LOBE  RESECTION AND PLACEMENT OF ON-Q;  Surgeon: [name], MD;  Location: [facility];  Service: Thoracic;  Laterality: Left;  . LUMBAR LAMINECTOMY  October 22, 2012  . LYMPH NODE DISSECTION Left 05/11/2016   Procedure: LYMPH NODE DISSECTION;  Surgeon: [name], MD;  Location: [facility];  Service: Thoracic;  Laterality: Left;  . STAPLING OF BLEBS Left 05/11/2016   Procedure: STAPLING OF APICAL BLEB;  Surgeon: [name], MD;  Location: [facility];  Service: Thoracic;  Laterality: Left;  . TEE WITHOUT CARDIOVERSION N/A 10/07/2019   Procedure: TRANSESOPHAGEAL ECHOCARDIOGRAM (TEE);  Surgeon: Dorothy Spark, MD;  Location[facility] ENDOSCOPY;  Service: Cardiovascular;  Laterality: N/A;  . VIDEO ASSISTED THORACOSCOPY Left 05/18/2016   Procedure: VIDEO ASSISTED THORACOSCOPY WITH REMOVAL OF LEFT APICAL BLEB;  Surgeon: [name], MD;  Location: [facility];  Service: Thoracic;  Laterality: Left;  Marland Kitchen VIDEO ASSISTED THORACOSCOPY (VATS)/WEDGE RESECTION Left 05/11/2016   Procedure: LEFT VIDEO ASSISTED THORACOSCOPY (VATS);  Surgeon: [name], MD;  Location: [facility];  Service: Thoracic;  Laterality: Left;  Marland Kitchen VIDEO BRONCHOSCOPY N/A 05/11/2016   Procedure: VIDEO BRONCHOSCOPY, LEFT LUNG;  Surgeon: [name], MD;  Location: [facility];  Service: Thoracic;  Laterality: N/A;  . VIDEO BRONCHOSCOPY N/A 05/18/2016   Procedure: VIDEO BRONCHOSCOPY WITH BRONCHIAL WASHING;  Surgeon: [name], MD;  Location: [facility];  Service: Thoracic;  Laterality: N/A;    REVIEW OF SYSTEMS:   Review of Systems  Constitutional: Negative for appetite change, chills, fatigue, fever and unexpected weight change.  HENT: Negative for mouth sores, nosebleeds, sore throat and trouble swallowing.   Eyes: Negative for eye problems and icterus.  Respiratory: Negative for cough, hemoptysis, shortness of breath and wheezing.   Cardiovascular: Negative for chest pain and leg swelling.  Gastrointestinal: Negative for abdominal pain, constipation, diarrhea, nausea and vomiting.  Genitourinary: Negative for bladder incontinence, difficulty urinating, dysuria, frequency and hematuria.   Musculoskeletal: Negative for back pain, gait problem, neck pain and neck stiffness.  Skin: Negative for itching and rash.  Neurological: Negative for dizziness, extremity weakness, gait problem, headaches, light-headedness and seizures.  Hematological: Negative for adenopathy. Does not bruise/bleed easily.  Psychiatric/Behavioral: Negative for confusion, depression and sleep disturbance. The patient is not nervous/anxious.     PHYSICAL EXAMINATION:  Blood pressure (!) 144/71, pulse 74, temperature 97.8 F (36.6 C), temperature source Tympanic, resp. rate 18, height 6' 3.5" (1.918 m), weight 157 lb 1.6 oz (71.3 kg), SpO2 96 %.  ECOG PERFORMANCE STATUS: 1 - Symptomatic but completely ambulatory  Physical Exam  Constitutional: Oriented to person, place, and time and well-developed, well-nourished, and in no distress.  HENT:  Head: Normocephalic and atraumatic.  Mouth/Throat: Oropharynx is clear and moist. No oropharyngeal exudate.  Eyes: Conjunctivae are normal. Right eye exhibits no discharge. Left eye exhibits no discharge. No scleral icterus.  Neck: Normal range of motion. Neck supple.  Cardiovascular:Systolic murmur auscultatedin hisposterior left thorax.Normal rate, regular rhythm and intact distal pulses.   Pulmonary/Chest: Effort normal and breath sounds normal. No respiratory distress. No wheezes. No rales.  Abdominal: Soft. Bowel sounds are normal. Exhibits no distension and no mass. There is no tenderness.  Musculoskeletal: Normal range of motion. Exhibits no edema.  Lymphadenopathy:  No cervical adenopathy.  Neurological: Alert and oriented to person, place, and time. Exhibits normal muscle tone. Gait normal. Coordination normal.  Skin: Skin is warm and dry. No rash noted. Not diaphoretic. No erythema. No pallor.  Psychiatric: Mood, memory and judgment normal.  Vitals reviewed.  LABORATORY DATA: Lab Results  Component Value Date   WBC 7.5 02/04/2020   HGB 11.8 (L) 02/04/2020   HCT 36.0 (L) 02/04/2020   MCV 103.2 (H) 02/04/2020   PLT 453 (H) 02/04/2020      Chemistry      Component Value Date/Time   NA 133 (L) 01/14/2020 0815   NA 132 (L) 10/05/2019 0830   NA 138 01/11/2017 1343   K 3.9 01/14/2020 0815   K 4.7 01/11/2017 1343   CL 105 01/14/2020 0815   CO2 25 01/14/2020 0815   CO2 23 01/11/2017 1343   BUN 10 01/14/2020 0815   BUN 14 10/05/2019 0830   BUN 20.2 01/11/2017 1343   CREATININE 1.37 (H) 01/14/2020 0815   CREATININE 1.6 (H) 01/11/2017 1343      Component Value Date/Time   CALCIUM 8.7 (L) 01/14/2020 0815   CALCIUM 9.3 01/11/2017 1343   ALKPHOS 92 01/14/2020 0815   ALKPHOS 89 01/11/2017 1343   AST 27 01/14/2020 0815   AST 41 (H) 01/11/2017 1343   ALT 12 01/14/2020 0815   ALT 27 01/11/2017 1343   BILITOT 0.4 01/14/2020 0815   BILITOT 0.49 01/11/2017 1343       RADIOGRAPHIC STUDIES:  CT Chest W Contrast  Result Date: 01/16/2020 CLINICAL DATA:  History of lung cancer.  Follow-up exam. EXAM: CT CHEST, ABDOMEN, AND PELVIS WITH CONTRAST TECHNIQUE: Multidetector CT imaging of the chest, abdomen and pelvis was performed following the standard protocol during bolus administration of intravenous contrast. CONTRAST:  134m ISOVUE-300 IOPAMIDOL (ISOVUE-300)  INJECTION 61% COMPARISON:  CT C AP 10/21/2019. FINDINGS: CT CHEST FINDINGS Cardiovascular: Right anterior chest wall Port-A-Cath is present with tip terminating in the superior vena cava. Normal heart size. Thoracic aortic vascular calcifications. Trace pericardial effusion. Dilated ascending thoracic aorta measuring 4 cm. Mediastinum/Nodes: No enlarged axillary, mediastinal or hilar lymphadenopathy. Normal appearance of the esophagus. Lungs/Pleura: Central airways are patent. Centrilobular and paraseptal emphysematous change. Stable 4 mm anterior right upper lobe nodule (image 70; series 6). Stable 5 mm posterior right lower lobe nodule (image 146; series 6). Redemonstrated postsurgical changes left hemithorax. Small bilateral pleural effusions redemonstrated. No pneumothorax. Redemonstrated calcified pleural plaques. Musculoskeletal: No aggressive or acute appearing osseous lesions. CT ABDOMEN PELVIS FINDINGS Hepatobiliary: Liver is normal in size and contour. Stable 2 mm low-attenuation lesion hepatic dome (image 59; series 2). Additional scattered hypodensities are stable and too small to characterize. Possible fatty deposition adjacent to the falciform  ligament. Gallbladder is unremarkable. Pancreas: Unremarkable Spleen: Unremarkable Adrenals/Urinary Tract: Normal adrenal glands. Kidneys enhance symmetrically with contrast. Subcentimeter too small to characterize partially exophytic low-attenuation lesions within the left kidney. Urinary bladder is unremarkable. Stomach/Bowel: Oral contrast material within the small and large bowel. Normal morphology of the stomach. No free fluid or free intraperitoneal air. Vascular/Lymphatic: Patient status post aortic endograft placement. No retroperitoneal lymphadenopathy. Reproductive: Heterogeneous prostate. Other: None. Musculoskeletal: Lower thoracic and lumbar spine degenerative changes. No aggressive or acute appearing osseous lesions. IMPRESSION: 1. Stable  postsurgical changes within the left hemithorax. No evidence for locally recurrent or metastatic disease. 2. Stable small bilateral pleural effusions. 3. Ascending thoracic aorta measures 4 cm. This can be reassessed at the time of imaging follow-up. 4. Emphysema and aortic atherosclerosis. Electronically Signed   By: Lovey Newcomer M.D.   On: 01/16/2020 19:00   CT Abdomen Pelvis W Contrast  Result Date: 01/16/2020 CLINICAL DATA:  History of lung cancer.  Follow-up exam. EXAM: CT CHEST, ABDOMEN, AND PELVIS WITH CONTRAST TECHNIQUE: Multidetector CT imaging of the chest, abdomen and pelvis was performed following the standard protocol during bolus administration of intravenous contrast. CONTRAST:  173m ISOVUE-300 IOPAMIDOL (ISOVUE-300) INJECTION 61% COMPARISON:  CT C AP 10/21/2019. FINDINGS: CT CHEST FINDINGS Cardiovascular: Right anterior chest wall Port-A-Cath is present with tip terminating in the superior vena cava. Normal heart size. Thoracic aortic vascular calcifications. Trace pericardial effusion. Dilated ascending thoracic aorta measuring 4 cm. Mediastinum/Nodes: No enlarged axillary, mediastinal or hilar lymphadenopathy. Normal appearance of the esophagus. Lungs/Pleura: Central airways are patent. Centrilobular and paraseptal emphysematous change. Stable 4 mm anterior right upper lobe nodule (image 70; series 6). Stable 5 mm posterior right lower lobe nodule (image 146; series 6). Redemonstrated postsurgical changes left hemithorax. Small bilateral pleural effusions redemonstrated. No pneumothorax. Redemonstrated calcified pleural plaques. Musculoskeletal: No aggressive or acute appearing osseous lesions. CT ABDOMEN PELVIS FINDINGS Hepatobiliary: Liver is normal in size and contour. Stable 2 mm low-attenuation lesion hepatic dome (image 59; series 2). Additional scattered hypodensities are stable and too small to characterize. Possible fatty deposition adjacent to the falciform ligament. Gallbladder is  unremarkable. Pancreas: Unremarkable Spleen: Unremarkable Adrenals/Urinary Tract: Normal adrenal glands. Kidneys enhance symmetrically with contrast. Subcentimeter too small to characterize partially exophytic low-attenuation lesions within the left kidney. Urinary bladder is unremarkable. Stomach/Bowel: Oral contrast material within the small and large bowel. Normal morphology of the stomach. No free fluid or free intraperitoneal air. Vascular/Lymphatic: Patient status post aortic endograft placement. No retroperitoneal lymphadenopathy. Reproductive: Heterogeneous prostate. Other: None. Musculoskeletal: Lower thoracic and lumbar spine degenerative changes. No aggressive or acute appearing osseous lesions. IMPRESSION: 1. Stable postsurgical changes within the left hemithorax. No evidence for locally recurrent or metastatic disease. 2. Stable small bilateral pleural effusions. 3. Ascending thoracic aorta measures 4 cm. This can be reassessed at the time of imaging follow-up. 4. Emphysema and aortic atherosclerosis. Electronically Signed   By: DLovey NewcomerM.D.   On: 01/16/2020 19:00     ASSESSMENT/PLAN:  This is a very pleasant 89ear old [place] male with metastatic disease who was initially diagnosed with stage IB non-small cell lung cancer, adenocarcinoma of the left upper lobe. Molecular studies showed no actionable mutations. PD-L1 expression was negative. First diagnosed December 2017.  He was status post a wedge resection of the left upper lobe followed by 4 cycles of adjuvant systemic chemotherapy with cisplatin and Alimta. He tolerated treatment well except for fatigue.  He had been on observationuntilJune of 2018.  He had  a repeat CT and PET scan which showed disease recurrence as well as metastatic disease with bilaterally pulmonary nodules and destructive bone lesions at the T4 vertebral body.   Theis currently undergoingsystemic chemotherapy with carboplatin, alimta, and  keytruda. He is status post36cycles. Starting from cycle #5, he has been on maintenance Alimta and Keytruda every 3 weeks.  The patient recently had a restaging CT scan performed.  Dr. Julien Nordmann personally and independently reviewed the scan and discussed the results with the patient today.  The scan did not show any evidence of disease progression. Recommend that the patient willproceedwith cycle #37today scheduled.   He will return to the clinic for a follow up visit in 3 weeks for evaluation before starting cycle #38.   The patient will continue to follow with cardiology closely regarding his mitral valve regurgitation.  Dr. Julien Nordmann discussed that the best time to have his procedure done would be the week before he receives chemotherapy.  The patient was advised to call immediately if he has any concerning symptoms in the interval. The patient voices understanding of current disease status and treatment options and is in agreement with the current care plan. All questions were answered. The patient knows to call the clinic with any problems, questions or concerns. We can certainly see the patient much sooner if necessary  No orders of the defined types were placed in this encounter.    Ritvik Mczeal L Majed Pellegrin, PA-C 02/04/20  ADDENDUM: Hematology/Oncology Attending: I had a face-to-face encounter with the patient today.  I recommended his care plan.  This is a very pleasant 64 years old African-American male with metastatic non-small cell lung cancer, adenocarcinoma status post 4 cycles of systemic chemotherapy with carboplatin, Alimta and Keytruda and he is currently on maintenance treatment with Alimta and Keytruda every 3 weeks status post total 36 cycles. The patient had repeat CT scan of the chest, abdomen pelvis performed recently.  I personally and independently reviewed the scans and discussed the results with the patient today. His scan showed no concerning findings for  disease progression. I recommended for the patient to continue his current treatment with maintenance Alimta and Keytruda with cycle #37 today as planned. The patient will come back for follow-up visit in 3 weeks for evaluation before the next cycle of his treatment. He was advised to call immediately if he has any concerning symptoms in the interval.  Disclaimer: This note was dictated with voice recognition software. Similar sounding words can inadvertently be transcribed and may be missed upon review. Eilleen Kempf, MD 02/05/20

## 2020-02-04 ENCOUNTER — Inpatient Hospital Stay: Payer: No Typology Code available for payment source | Attending: Internal Medicine | Admitting: Physician Assistant

## 2020-02-04 ENCOUNTER — Inpatient Hospital Stay: Payer: No Typology Code available for payment source

## 2020-02-04 ENCOUNTER — Other Ambulatory Visit: Payer: Self-pay

## 2020-02-04 VITALS — BP 144/71 | HR 74 | Temp 97.8°F | Resp 18 | Ht 75.5 in | Wt 157.1 lb

## 2020-02-04 DIAGNOSIS — C3492 Malignant neoplasm of unspecified part of left bronchus or lung: Secondary | ICD-10-CM

## 2020-02-04 DIAGNOSIS — C3432 Malignant neoplasm of lower lobe, left bronchus or lung: Secondary | ICD-10-CM

## 2020-02-04 DIAGNOSIS — Z5111 Encounter for antineoplastic chemotherapy: Secondary | ICD-10-CM | POA: Insufficient documentation

## 2020-02-04 DIAGNOSIS — C3412 Malignant neoplasm of upper lobe, left bronchus or lung: Secondary | ICD-10-CM | POA: Insufficient documentation

## 2020-02-04 DIAGNOSIS — Z5112 Encounter for antineoplastic immunotherapy: Secondary | ICD-10-CM | POA: Insufficient documentation

## 2020-02-04 DIAGNOSIS — Z79899 Other long term (current) drug therapy: Secondary | ICD-10-CM | POA: Insufficient documentation

## 2020-02-04 DIAGNOSIS — Z95828 Presence of other vascular implants and grafts: Secondary | ICD-10-CM

## 2020-02-04 LAB — CBC WITH DIFFERENTIAL (CANCER CENTER ONLY)
Abs Immature Granulocytes: 0.04 10*3/uL (ref 0.00–0.07)
Basophils Absolute: 0.1 10*3/uL (ref 0.0–0.1)
Basophils Relative: 1 %
Eosinophils Absolute: 0.3 10*3/uL (ref 0.0–0.5)
Eosinophils Relative: 3 %
HCT: 36 % — ABNORMAL LOW (ref 39.0–52.0)
Hemoglobin: 11.8 g/dL — ABNORMAL LOW (ref 13.0–17.0)
Immature Granulocytes: 1 %
Lymphocytes Relative: 15 %
Lymphs Abs: 1.1 10*3/uL (ref 0.7–4.0)
MCH: 33.8 pg (ref 26.0–34.0)
MCHC: 32.8 g/dL (ref 30.0–36.0)
MCV: 103.2 fL — ABNORMAL HIGH (ref 80.0–100.0)
Monocytes Absolute: 1.3 10*3/uL — ABNORMAL HIGH (ref 0.1–1.0)
Monocytes Relative: 17 %
Neutro Abs: 4.8 10*3/uL (ref 1.7–7.7)
Neutrophils Relative %: 63 %
Platelet Count: 453 10*3/uL — ABNORMAL HIGH (ref 150–400)
RBC: 3.49 MIL/uL — ABNORMAL LOW (ref 4.22–5.81)
RDW: 15 % (ref 11.5–15.5)
WBC Count: 7.5 10*3/uL (ref 4.0–10.5)
nRBC: 0 % (ref 0.0–0.2)

## 2020-02-04 LAB — CMP (CANCER CENTER ONLY)
ALT: 8 U/L (ref 0–44)
AST: 25 U/L (ref 15–41)
Albumin: 3 g/dL — ABNORMAL LOW (ref 3.5–5.0)
Alkaline Phosphatase: 75 U/L (ref 38–126)
Anion gap: 1 — ABNORMAL LOW (ref 5–15)
BUN: 13 mg/dL (ref 8–23)
CO2: 26 mmol/L (ref 22–32)
Calcium: 8.9 mg/dL (ref 8.9–10.3)
Chloride: 107 mmol/L (ref 98–111)
Creatinine: 1.27 mg/dL — ABNORMAL HIGH (ref 0.61–1.24)
GFR, Estimated: 58 mL/min — ABNORMAL LOW (ref >60)
Glucose, Bld: 90 mg/dL (ref 70–99)
Potassium: 4 mmol/L (ref 3.5–5.1)
Sodium: 134 mmol/L — ABNORMAL LOW (ref 135–145)
Total Bilirubin: 0.4 mg/dL (ref 0.3–1.2)
Total Protein: 6.3 g/dL — ABNORMAL LOW (ref 6.5–8.1)

## 2020-02-04 LAB — TSH: TSH: 2.388 u[IU]/mL (ref 0.320–4.118)

## 2020-02-04 MED ORDER — SODIUM CHLORIDE 0.9 % IV SOLN
10.0000 mg | Freq: Once | INTRAVENOUS | Status: AC
  Administered 2020-02-04: 10 mg via INTRAVENOUS
  Filled 2020-02-04: qty 10

## 2020-02-04 MED ORDER — CYANOCOBALAMIN 1000 MCG/ML IJ SOLN
1000.0000 ug | Freq: Once | INTRAMUSCULAR | Status: AC
  Administered 2020-02-04: 1000 ug via INTRAMUSCULAR

## 2020-02-04 MED ORDER — SODIUM CHLORIDE 0.9% FLUSH
10.0000 mL | Freq: Once | INTRAVENOUS | Status: AC
  Administered 2020-02-04: 10 mL
  Filled 2020-02-04: qty 10

## 2020-02-04 MED ORDER — ONDANSETRON HCL 4 MG/2ML IJ SOLN
INTRAMUSCULAR | Status: AC
  Filled 2020-02-04: qty 4

## 2020-02-04 MED ORDER — CYANOCOBALAMIN 1000 MCG/ML IJ SOLN
INTRAMUSCULAR | Status: AC
  Filled 2020-02-04: qty 1

## 2020-02-04 MED ORDER — SODIUM CHLORIDE 0.9 % IV SOLN
Freq: Once | INTRAVENOUS | Status: AC
  Filled 2020-02-04: qty 250

## 2020-02-04 MED ORDER — SODIUM CHLORIDE 0.9 % IV SOLN
500.0000 mg/m2 | Freq: Once | INTRAVENOUS | Status: AC
  Administered 2020-02-04: 1000 mg via INTRAVENOUS
  Filled 2020-02-04: qty 40

## 2020-02-04 MED ORDER — ONDANSETRON HCL 4 MG/2ML IJ SOLN
8.0000 mg | Freq: Once | INTRAMUSCULAR | Status: AC
  Administered 2020-02-04: 8 mg via INTRAVENOUS

## 2020-02-04 MED ORDER — SODIUM CHLORIDE 0.9 % IV SOLN
200.0000 mg | Freq: Once | INTRAVENOUS | Status: AC
  Administered 2020-02-04: 200 mg via INTRAVENOUS
  Filled 2020-02-04: qty 8

## 2020-02-04 MED ORDER — HEPARIN SOD (PORK) LOCK FLUSH 100 UNIT/ML IV SOLN
500.0000 [IU] | Freq: Once | INTRAVENOUS | Status: AC | PRN
  Administered 2020-02-04: 500 [IU]
  Filled 2020-02-04: qty 5

## 2020-02-04 MED ORDER — SODIUM CHLORIDE 0.9% FLUSH
10.0000 mL | INTRAVENOUS | Status: DC | PRN
  Administered 2020-02-04: 10 mL
  Filled 2020-02-04: qty 10

## 2020-02-04 NOTE — Patient Instructions (Signed)

## 2020-02-04 NOTE — Patient Instructions (Signed)
[facility] Discharge Instructions for Patients Receiving Chemotherapy  Today you received the following chemotherapy agents pembrolizumab, pemetrexed  To help prevent nausea and vomiting after your treatment, we encourage you to take your nausea medication as directed.    If you develop nausea and vomiting that is not controlled by your nausea medication, call the clinic.   BELOW ARE SYMPTOMS THAT SHOULD BE REPORTED IMMEDIATELY:  *FEVER GREATER THAN 100.5 F  *CHILLS WITH OR WITHOUT FEVER  NAUSEA AND VOMITING THAT IS NOT CONTROLLED WITH YOUR NAUSEA MEDICATION  *UNUSUAL SHORTNESS OF BREATH  *UNUSUAL BRUISING OR BLEEDING  TENDERNESS IN MOUTH AND THROAT WITH OR WITHOUT PRESENCE OF ULCERS  *URINARY PROBLEMS  *BOWEL PROBLEMS  UNUSUAL RASH Items with * indicate a potential emergency and should be followed up as soon as possible.  Feel free to call the clinic should you have any questions or concerns. The clinic phone number is [phone].  Please show the [facility] at check-in to the Emergency Department and triage nurse.

## 2020-02-05 ENCOUNTER — Encounter: Payer: Self-pay | Admitting: Physician Assistant

## 2020-02-08 ENCOUNTER — Telehealth: Payer: Self-pay

## 2020-02-08 NOTE — Telephone Encounter (Signed)
Per patient request, called for an update since he had not contacted the office in 2 weeks. He states he will likely schedule heart cath, but not right now.  He reports he will call tomorrow to arrange.

## 2020-02-15 ENCOUNTER — Encounter: Payer: Self-pay | Admitting: Cardiovascular Disease

## 2020-02-15 NOTE — Telephone Encounter (Signed)
Darrin Nipper L 12 minutes ago (2:07 PM)  JR   Patient wanted to speak to [facility] about setting up a CAD test. He would like Katy to call him       Documentation    Attempted to return call.  No VM picked up to leave message ever many rings.  Will try again later.

## 2020-02-15 NOTE — Telephone Encounter (Signed)
Scheduled the patient for evaluation with [facility] on 11/10 for cath 11/18. The patient was grateful for call and agrees with plan.    Tentatively scheduled the patient for [facility] 11/18. Arrival time 0530 for 0730 case. Will arrange labs, Covid test and update H&P 11/10. Instruction letter will be created for printing at visit.

## 2020-02-15 NOTE — Telephone Encounter (Signed)
Patient wanted to speak to [facility] about setting up a CAD test. He would like Katy to call him

## 2020-02-15 NOTE — Telephone Encounter (Signed)
This encounter was created in error - please disregard.

## 2020-02-18 NOTE — Telephone Encounter (Signed)
Instruction letter created for printing at visit. Procedure info a sent to precert.

## 2020-02-24 ENCOUNTER — Other Ambulatory Visit: Payer: Self-pay | Admitting: Cardiology

## 2020-02-25 ENCOUNTER — Inpatient Hospital Stay: Payer: No Typology Code available for payment source

## 2020-02-25 ENCOUNTER — Encounter: Payer: Self-pay | Admitting: Internal Medicine

## 2020-02-25 ENCOUNTER — Other Ambulatory Visit: Payer: Self-pay

## 2020-02-25 ENCOUNTER — Inpatient Hospital Stay: Payer: No Typology Code available for payment source | Attending: Internal Medicine | Admitting: Internal Medicine

## 2020-02-25 VITALS — BP 128/76 | HR 67 | Temp 97.6°F | Resp 18 | Ht 75.5 in | Wt 154.9 lb

## 2020-02-25 DIAGNOSIS — Z5112 Encounter for antineoplastic immunotherapy: Secondary | ICD-10-CM | POA: Diagnosis present

## 2020-02-25 DIAGNOSIS — Z79899 Other long term (current) drug therapy: Secondary | ICD-10-CM | POA: Insufficient documentation

## 2020-02-25 DIAGNOSIS — C3432 Malignant neoplasm of lower lobe, left bronchus or lung: Secondary | ICD-10-CM

## 2020-02-25 DIAGNOSIS — C3492 Malignant neoplasm of unspecified part of left bronchus or lung: Secondary | ICD-10-CM | POA: Diagnosis not present

## 2020-02-25 DIAGNOSIS — C3412 Malignant neoplasm of upper lobe, left bronchus or lung: Secondary | ICD-10-CM | POA: Diagnosis present

## 2020-02-25 DIAGNOSIS — Z95828 Presence of other vascular implants and grafts: Secondary | ICD-10-CM

## 2020-02-25 DIAGNOSIS — Z5111 Encounter for antineoplastic chemotherapy: Secondary | ICD-10-CM | POA: Insufficient documentation

## 2020-02-25 LAB — CBC WITH DIFFERENTIAL (CANCER CENTER ONLY)
Abs Immature Granulocytes: 0.04 10*3/uL (ref 0.00–0.07)
Basophils Absolute: 0.1 10*3/uL (ref 0.0–0.1)
Basophils Relative: 1 %
Eosinophils Absolute: 0.2 10*3/uL (ref 0.0–0.5)
Eosinophils Relative: 3 %
HCT: 37.3 % — ABNORMAL LOW (ref 39.0–52.0)
Hemoglobin: 12 g/dL — ABNORMAL LOW (ref 13.0–17.0)
Immature Granulocytes: 1 %
Lymphocytes Relative: 12 %
Lymphs Abs: 0.9 10*3/uL (ref 0.7–4.0)
MCH: 33.6 pg (ref 26.0–34.0)
MCHC: 32.2 g/dL (ref 30.0–36.0)
MCV: 104.5 fL — ABNORMAL HIGH (ref 80.0–100.0)
Monocytes Absolute: 1.2 10*3/uL — ABNORMAL HIGH (ref 0.1–1.0)
Monocytes Relative: 17 %
Neutro Abs: 4.7 10*3/uL (ref 1.7–7.7)
Neutrophils Relative %: 66 %
Platelet Count: 381 10*3/uL (ref 150–400)
RBC: 3.57 MIL/uL — ABNORMAL LOW (ref 4.22–5.81)
RDW: 14.6 % (ref 11.5–15.5)
WBC Count: 7.1 10*3/uL (ref 4.0–10.5)
nRBC: 0 % (ref 0.0–0.2)

## 2020-02-25 LAB — CMP (CANCER CENTER ONLY)
ALT: 11 U/L (ref 0–44)
AST: 25 U/L (ref 15–41)
Albumin: 3 g/dL — ABNORMAL LOW (ref 3.5–5.0)
Alkaline Phosphatase: 81 U/L (ref 38–126)
Anion gap: 5 (ref 5–15)
BUN: 12 mg/dL (ref 8–23)
CO2: 23 mmol/L (ref 22–32)
Calcium: 8.4 mg/dL — ABNORMAL LOW (ref 8.9–10.3)
Chloride: 106 mmol/L (ref 98–111)
Creatinine: 1.32 mg/dL — ABNORMAL HIGH (ref 0.61–1.24)
GFR, Estimated: 59 mL/min — ABNORMAL LOW (ref >60)
Glucose, Bld: 87 mg/dL (ref 70–99)
Potassium: 4.1 mmol/L (ref 3.5–5.1)
Sodium: 134 mmol/L — ABNORMAL LOW (ref 135–145)
Total Bilirubin: 0.4 mg/dL (ref 0.3–1.2)
Total Protein: 6.4 g/dL — ABNORMAL LOW (ref 6.5–8.1)

## 2020-02-25 LAB — TSH: TSH: 1.652 u[IU]/mL (ref 0.320–4.118)

## 2020-02-25 MED ORDER — SODIUM CHLORIDE 0.9% FLUSH
10.0000 mL | INTRAVENOUS | Status: DC | PRN
  Administered 2020-02-25: 10 mL
  Filled 2020-02-25: qty 10

## 2020-02-25 MED ORDER — SODIUM CHLORIDE 0.9% FLUSH
10.0000 mL | Freq: Once | INTRAVENOUS | Status: AC
  Administered 2020-02-25: 10 mL
  Filled 2020-02-25: qty 10

## 2020-02-25 MED ORDER — SODIUM CHLORIDE 0.9 % IV SOLN
500.0000 mg/m2 | Freq: Once | INTRAVENOUS | Status: AC
  Administered 2020-02-25: 1000 mg via INTRAVENOUS
  Filled 2020-02-25: qty 40

## 2020-02-25 MED ORDER — SODIUM CHLORIDE 0.9 % IV SOLN
10.0000 mg | Freq: Once | INTRAVENOUS | Status: AC
  Administered 2020-02-25: 10 mg via INTRAVENOUS
  Filled 2020-02-25: qty 10

## 2020-02-25 MED ORDER — SODIUM CHLORIDE 0.9 % IV SOLN
Freq: Once | INTRAVENOUS | Status: DC
  Filled 2020-02-25: qty 250

## 2020-02-25 MED ORDER — HEPARIN SOD (PORK) LOCK FLUSH 100 UNIT/ML IV SOLN
500.0000 [IU] | Freq: Once | INTRAVENOUS | Status: AC | PRN
  Administered 2020-02-25: 500 [IU]
  Filled 2020-02-25: qty 5

## 2020-02-25 MED ORDER — SODIUM CHLORIDE 0.9 % IV SOLN
200.0000 mg | Freq: Once | INTRAVENOUS | Status: AC
  Administered 2020-02-25: 200 mg via INTRAVENOUS
  Filled 2020-02-25: qty 8

## 2020-02-25 MED ORDER — SODIUM CHLORIDE 0.9 % IV SOLN
Freq: Once | INTRAVENOUS | Status: AC
  Filled 2020-02-25: qty 250

## 2020-02-25 MED ORDER — ONDANSETRON HCL 4 MG/2ML IJ SOLN
INTRAMUSCULAR | Status: AC
  Filled 2020-02-25: qty 4

## 2020-02-25 MED ORDER — ONDANSETRON HCL 4 MG/2ML IJ SOLN
8.0000 mg | Freq: Once | INTRAMUSCULAR | Status: AC
  Administered 2020-02-25: 8 mg via INTRAVENOUS

## 2020-02-25 NOTE — Patient Instructions (Signed)

## 2020-02-25 NOTE — Patient Instructions (Signed)
[facility] Discharge Instructions for Patients Receiving Chemotherapy  Today you received the following chemotherapy agents pembrolizumab, pemetrexed  To help prevent nausea and vomiting after your treatment, we encourage you to take your nausea medication as directed.    If you develop nausea and vomiting that is not controlled by your nausea medication, call the clinic.   BELOW ARE SYMPTOMS THAT SHOULD BE REPORTED IMMEDIATELY:  *FEVER GREATER THAN 100.5 F  *CHILLS WITH OR WITHOUT FEVER  NAUSEA AND VOMITING THAT IS NOT CONTROLLED WITH YOUR NAUSEA MEDICATION  *UNUSUAL SHORTNESS OF BREATH  *UNUSUAL BRUISING OR BLEEDING  TENDERNESS IN MOUTH AND THROAT WITH OR WITHOUT PRESENCE OF ULCERS  *URINARY PROBLEMS  *BOWEL PROBLEMS  UNUSUAL RASH Items with * indicate a potential emergency and should be followed up as soon as possible.  Feel free to call the clinic should you have any questions or concerns. The clinic phone number is [phone].  Please show the [facility] at check-in to the Emergency Department and triage nurse.

## 2020-02-25 NOTE — Progress Notes (Signed)
[place] [phone]   Fax:([phone]) [phone]  OFFICE PROGRESS NOTE  [name], MD [address] [place] [address]  DIAGNOSIS: Metastatic non-small cell lung cancer, adenocarcinoma initially diagnosed as stage IB (T2a, N0, M0) non-small cell lung cancer, adenocarcinoma diagnosed in December 2017.  The patient has evidence for disease recurrence in July 2019.  Biomarker Findings Tumor Mutational Burden - TMB-Intermediate (16 Muts/Mb) Microsatellite status - MS-Stable Genomic Findings For a complete list of the genes assayed, please refer to the Appendix. KIT amplification KRAS N47S SMAD4 splice site 9628-3M>O QH47 I232F 7 Disease relevant genes with no reportable alterations: EGFR, ALK, BRAF, MET, RET, ERBB2, ROS1   PDL 1 expression is 0%  PRIOR THERAPY:  1) status post left lower lobectomy as well as wedge resection of the left upper lobe on 05/11/2016. 2) Adjuvant systemic chemotherapy with cisplatin 75 MG/M2 and Alimta 500 MG/M2 every 3 weeks. First dose 07/03/2016. Status post 4 cycles.  CURRENT THERAPY: Systemic chemotherapy with carboplatin for AUC of 5, Alimta 500 mg/M2 and Keytruda 200 mg IV every 3 weeks.  Status post 37 cycles.  Starting from cycle #5 the patient will be treated with maintenance Alimta and Ketruda (pembrolizumab) every 3 weeks.  INTERVAL HISTORY: [name] [name] 64 y.o. male returns to the clinic today for follow-up visit.  The patient is feeling fine today with no concerning complaints.  He is scheduled for cardiac cath in few weeks for evaluation of the mitral valve regurgitation.  He denied having any current chest pain, shortness of breath, cough or hemoptysis.  He denied having any fever or chills.  He has no nausea, vomiting, diarrhea or constipation.  He has no headache or visual changes.  The patient is here today for evaluation before starting cycle #8 of his treatment.  MEDICAL HISTORY: Past  Medical History:  Diagnosis Date  . AAA (abdominal aortic aneurysm) ([place])   . AAA (abdominal aortic aneurysm) without rupture ([place]) 02/04/2014  . Cancer ([place])    lung  . Encounter for antineoplastic chemotherapy 06/06/2016  . History of hepatitis B   . HNP (herniated nucleus pulposus), lumbar    L4 with radiculopathy  . Hypertension   . Lung cancer ([place]) 05/18/2016  . Malignant neoplasm of lower lobe of left lung ([place]) 04/05/2016  . Mass of left lung   . Mitral insufficiency 04/06/2016   This patient will eventually need MV repair if his prognosis is good from oncology standpoint. However, his lung cancer therapy  is the priority right now. His cardiac condition won't preclude possible lung surgery.  Once his lung cancer is under control he will need a TEE to further evaluate his mitral valve anatomy and MR severity, and also have ischemic workup as tere is evidence on calcificati  . Renal insufficiency 10/09/2016  . S/P partial lobectomy of lung 05/11/2016  . Varicose veins of legs     ALLERGIES:  is allergic to tramadol.  MEDICATIONS:  Current Outpatient Medications  Medication Sig Dispense Refill  . carvedilol (COREG) 6.25 MG tablet Take 1 tablet (6.25 mg total) by mouth 2 (two) times daily. 654 tablet 3  . folic acid (FOLVITE) 1 MG tablet Take 1 tablet (1 mg total) by mouth daily. 90 tablet 1  . lidocaine-prilocaine (EMLA) cream Apply 1 application topically as needed. Beginning may June 1st   apply a small amount of emla cream  to a cotton ball and  apply over port site 1-2  Hours prior to chemotherapy . Do not rub in cream . Cover with plastic wrap. 30 g 0  . lisinopril (ZESTRIL) 2.5 MG tablet TAKE 1 TABLET BY MOUTH EVERY DAY 90 tablet 3   No current facility-administered medications for this visit.    SURGICAL HISTORY:  Past Surgical History:  Procedure Laterality Date  . ABDOMINAL AORTIC ENDOVASCULAR STENT GRAFT N/A 03/12/2016   Procedure: ABDOMINAL AORTIC ENDOVASCULAR STENT  GRAFT;  Surgeon: Conrad Loudonville, MD;  Location: [facility];  Service: Vascular;  Laterality: N/A;  . COLONOSCOPY    . INGUINAL HERNIA REPAIR Left 1975   Left inguinal hernia  . INGUINAL HERNIA REPAIR Right   . IR IMAGING GUIDED PORT INSERTION  09/04/2019  . LOBECTOMY Left 05/11/2016   Procedure: LEFT LOWER LOBE LOBECTOMY AND LEFT UPPER LOBE  RESECTION AND PLACEMENT OF ON-Q;  Surgeon: [name], MD;  Location: [facility];  Service: Thoracic;  Laterality: Left;  . LUMBAR LAMINECTOMY  October 22, 2012  . LYMPH NODE DISSECTION Left 05/11/2016   Procedure: LYMPH NODE DISSECTION;  Surgeon: [name], MD;  Location: [facility];  Service: Thoracic;  Laterality: Left;  . STAPLING OF BLEBS Left 05/11/2016   Procedure: STAPLING OF APICAL BLEB;  Surgeon: [name], MD;  Location: [facility];  Service: Thoracic;  Laterality: Left;  . TEE WITHOUT CARDIOVERSION N/A 10/07/2019   Procedure: TRANSESOPHAGEAL ECHOCARDIOGRAM (TEE);  Surgeon: Dorothy Spark, MD;  Location: [facility] ENDOSCOPY;  Service: Cardiovascular;  Laterality: N/A;  . VIDEO ASSISTED THORACOSCOPY Left 05/18/2016   Procedure: VIDEO ASSISTED THORACOSCOPY WITH REMOVAL OF LEFT APICAL BLEB;  Surgeon: [name], MD;  Location: [facility];  Service: Thoracic;  Laterality: Left;  Marland Kitchen VIDEO ASSISTED THORACOSCOPY (VATS)/WEDGE RESECTION Left 05/11/2016   Procedure: LEFT VIDEO ASSISTED THORACOSCOPY (VATS);  Surgeon: [name], MD;  Location: [facility];  Service: Thoracic;  Laterality: Left;  Marland Kitchen VIDEO BRONCHOSCOPY N/A 05/11/2016   Procedure: VIDEO BRONCHOSCOPY, LEFT LUNG;  Surgeon: [name], MD;  Location: [facility];  Service: Thoracic;  Laterality: N/A;  . VIDEO BRONCHOSCOPY N/A 05/18/2016   Procedure: VIDEO BRONCHOSCOPY WITH BRONCHIAL WASHING;  Surgeon: [name], MD;  Location: [facility];  Service: Thoracic;  Laterality: N/A;    REVIEW OF SYSTEMS:  A comprehensive review of systems was negative.   PHYSICAL EXAMINATION: General appearance: alert,  cooperative and no distress Head: Normocephalic, without obvious abnormality, atraumatic Neck: no adenopathy, no JVD, supple, symmetrical, trachea midline and thyroid not enlarged, symmetric, no tenderness/mass/nodules Lymph nodes: Cervical, supraclavicular, and axillary nodes normal. Resp: clear to auscultation bilaterally Back: symmetric, no curvature. ROM normal. No CVA tenderness. Cardio: regular rate and rhythm, S1, S2 normal, no murmur, click, rub or gallop GI: soft, non-tender; bowel sounds normal; no masses,  no organomegaly Extremities: extremities normal, atraumatic, no cyanosis or edema   ECOG PERFORMANCE STATUS: 1 - Symptomatic but completely ambulatory  Blood pressure 128/76, pulse 67, temperature 97.6 F (36.4 C), temperature source Tympanic, resp. rate 18, height 6' 3.5" (1.918 m), weight 154 lb 14.4 oz (70.3 kg), SpO2 99 %.  LABORATORY DATA: Lab Results  Component Value Date   WBC 7.1 02/25/2020   HGB 12.0 (L) 02/25/2020   HCT 37.3 (L) 02/25/2020   MCV 104.5 (H) 02/25/2020   PLT 381 02/25/2020      Chemistry      Component Value Date/Time   NA 134 (L) 02/04/2020 0851   NA 132 (L)  10/05/2019 0830   NA 138 01/11/2017 1343   K 4.0 02/04/2020 0851   K 4.7 01/11/2017 1343   CL 107 02/04/2020 0851   CO2 26 02/04/2020 0851   CO2 23 01/11/2017 1343   BUN 13 02/04/2020 0851   BUN 14 10/05/2019 0830   BUN 20.2 01/11/2017 1343   CREATININE 1.27 (H) 02/04/2020 0851   CREATININE 1.6 (H) 01/11/2017 1343      Component Value Date/Time   CALCIUM 8.9 02/04/2020 0851   CALCIUM 9.3 01/11/2017 1343   ALKPHOS 75 02/04/2020 0851   ALKPHOS 89 01/11/2017 1343   AST 25 02/04/2020 0851   AST 41 (H) 01/11/2017 1343   ALT 8 02/04/2020 0851   ALT 27 01/11/2017 1343   BILITOT 0.4 02/04/2020 0851   BILITOT 0.49 01/11/2017 1343       RADIOGRAPHIC STUDIES: No results found.  ASSESSMENT AND PLAN:  This is a very pleasant 64 years old white male with a stage IB non-small  cell lung cancer, adenocarcinoma status post wedge resection of the left upper lobe followed by 4 cycles of adjuvant systemic chemotherapy with cisplatin and Alimta and he tolerated his treatment well except for fatigue. The patient has been on observation since June 2018.   The recent imaging studies including CT scan of the chest as well as a PET scan showed evidence for disease recurrence with metastatic disease presented with bilateral pulmonary nodules as well as destructive bone lesions at T4 vertebral body, biopsy proven to be metastatic adenocarcinoma. Molecular studies by foundation 1 showed no actionable mutations.  PDL 1 expression was negative. The patient is currently undergoing treatment with carboplatin, Alimta and Ketruda (pembrolizumab) status post 37 cycles.  Starting from cycle #5 he is on maintenance treatment with Alimta and Keytruda every 3 weeks. The patient continues to tolerate his treatment with maintenance Alimta and Keytruda fairly well. I recommended for him to proceed with cycle #38 today as planned. He will come back for follow-up visit in 3 weeks for evaluation before the next cycle of his treatment. I would consider repeating imaging studies after the next cycle of his treatment. He was advised to call immediately if he has any concerning symptoms in the interval. The patient voices understanding of current disease status and treatment options and is in agreement with the current care plan. All questions were answered. The patient knows to call the clinic with any problems, questions or concerns. We can certainly see the patient much sooner if necessary. Disclaimer: This note was dictated with voice recognition software. Similar sounding words can inadvertently be transcribed and may be missed upon review. Eilleen Kempf, MD 02/25/20

## 2020-03-01 NOTE — H&P (View-Only) (Signed)
[facility] [place]                                       Cardiology Office Note    Date:  03/02/2020   ID:  William Kales [name], DOB March 07, 1956, MRN [id number]  PCP:  [name], MD  Cardiologist: Dr. Meda Coffee  CC: follow up severe MR  History of Present Illness:  William CERNEY [name] is a 64 y.o. 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   Patient states that he was first noted to have a heart murmur on physical exam when he was being worked up for primary lung cancer in November 2017 after CT imaging revealed suspicious mass in the left lung following stent graft repair of abdominal aortic aneurysm.  Echocardiogram performed at that time revealed normal left ventricular function with prolapse involving the anterior leaflet of the mitral valve and what was felt to be moderate mitral regurgitation.  The patient underwent uncomplicated left lower lobectomy with wedge resection of the left upper lobe by Dr. 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 The patient has been followed closely ever since by Dr. Earlie Server.  Follow-up CT and PET imaging performed in 2019 revealed disease recurrence with bilateral lung nodules and hypermetabolic destructive lesion in the T4 vertebral body consistent with bony involvement.  The patient has remained on long-term chemotherapy ever since (carboplatin for AUC of 5, Alimta 500 mg/M2 and Keytruda 200 mg IV) and follow-up CT imaging has revealed no progression of disease. He then developed progressive exertional shortness of breath.  Follow-up transthoracic echocardiogram 07/2019 revealed  normal left ventricular function with severe mitral regurgitation and signs of elevated right heart pressures.  TEE October 07, 2019 confirmed the presence of severe mitral regurgitation.  The jet of regurgitation was posterior with overriding prolapse of the anterior leaflet.  There was reversal of flow in the pulmonary veins with PISA radius measured 1.0 cm, ERO 0.45 cm, and regurgitant volume estimated 58 mL. He underwent cardiac surgical evaluation with Dr. Roxy Manns 01/07/2020 and then referred to Dr. Burt Knack for consideration of transcatheter edge-to-edge mitral valve repair as a less invasive treatment option in the context of his lung cancer. Plans were made for [facility] to evaluate for CAD and assess intracardiac hemodynamics.   Today he presents to clinic for follow up. No CP. Hast stable class II dyspnea on exertion. Gets short of breath with activities like walking his dogs or bringing the trash cans to end of the driveway. He has chronic LE edema in ankles since starting chemo. No orthopnea or PND. No dizziness or syncope. No blood in stool or urine. No palpitations.    Past Medical History:  Diagnosis Date  . AAA (abdominal aortic aneurysm) ([place])   . AAA (abdominal aortic aneurysm) without rupture ([place]) 02/04/2014  . Cancer ([place])    lung  . Encounter for antineoplastic chemotherapy 06/06/2016  . History of hepatitis B   . HNP (herniated nucleus pulposus), lumbar    L4 with radiculopathy  . Hypertension   . Lung  cancer ([place]) 05/18/2016  . Malignant neoplasm of lower lobe of left lung ([place]) 04/05/2016  . Mass of left lung   . Mitral insufficiency 04/06/2016   This patient will eventually need MV repair if his prognosis is good from oncology standpoint. However, his lung cancer therapy  is the priority right now. His cardiac condition won't preclude possible lung surgery.  Once his lung cancer is under control he will need a TEE to further evaluate his mitral valve anatomy and MR severity, and also  have ischemic workup as tere is evidence on calcificati  . Renal insufficiency 10/09/2016  . S/P partial lobectomy of lung 05/11/2016  . Varicose veins of legs     Past Surgical History:  Procedure Laterality Date  . ABDOMINAL AORTIC ENDOVASCULAR STENT GRAFT N/A 03/12/2016   Procedure: ABDOMINAL AORTIC ENDOVASCULAR STENT GRAFT;  Surgeon: Conrad St. Rose, MD;  Location: [place];  Service: Vascular;  Laterality: N/A;  . COLONOSCOPY    . INGUINAL HERNIA REPAIR Left 1975   Left inguinal hernia  . INGUINAL HERNIA REPAIR Right   . IR IMAGING GUIDED PORT INSERTION  09/04/2019  . LOBECTOMY Left 05/11/2016   Procedure: LEFT LOWER LOBE LOBECTOMY AND LEFT UPPER LOBE  RESECTION AND PLACEMENT OF ON-Q;  Surgeon: [name], MD;  Location: [place];  Service: Thoracic;  Laterality: Left;  . LUMBAR LAMINECTOMY  October 22, 2012  . LYMPH NODE DISSECTION Left 05/11/2016   Procedure: LYMPH NODE DISSECTION;  Surgeon: [name], MD;  Location: [place];  Service: Thoracic;  Laterality: Left;  . STAPLING OF BLEBS Left 05/11/2016   Procedure: STAPLING OF APICAL BLEB;  Surgeon: [name], MD;  Location: [place];  Service: Thoracic;  Laterality: Left;  . TEE WITHOUT CARDIOVERSION N/A 10/07/2019   Procedure: TRANSESOPHAGEAL ECHOCARDIOGRAM (TEE);  Surgeon: Dorothy Spark, MD;  Location: [facility] ENDOSCOPY;  Service: Cardiovascular;  Laterality: N/A;  . VIDEO ASSISTED THORACOSCOPY Left 05/18/2016   Procedure: VIDEO ASSISTED THORACOSCOPY WITH REMOVAL OF LEFT APICAL BLEB;  Surgeon: [name], MD;  Location: [place];  Service: Thoracic;  Laterality: Left;  Marland Kitchen VIDEO ASSISTED THORACOSCOPY (VATS)/WEDGE RESECTION Left 05/11/2016   Procedure: LEFT VIDEO ASSISTED THORACOSCOPY (VATS);  Surgeon: [name], MD;  Location: [place];  Service: Thoracic;  Laterality: Left;  Marland Kitchen VIDEO BRONCHOSCOPY N/A 05/11/2016   Procedure: VIDEO BRONCHOSCOPY, LEFT LUNG;  Surgeon: [name], MD;  Location: [place];  Service: Thoracic;   Laterality: N/A;  . VIDEO BRONCHOSCOPY N/A 05/18/2016   Procedure: VIDEO BRONCHOSCOPY WITH BRONCHIAL WASHING;  Surgeon: [name], MD;  Location: [place];  Service: Thoracic;  Laterality: N/A;    Current Medications: Outpatient Medications Prior to Visit  Medication Sig Dispense Refill  . carvedilol (COREG) 6.25 MG tablet Take 1 tablet (6.25 mg total) by mouth 2 (two) times daily. 220 tablet 3  . folic acid (FOLVITE) 1 MG tablet Take 1 tablet (1 mg total) by mouth daily. 90 tablet 1  . lidocaine-prilocaine (EMLA) cream Apply 1 application topically as needed. Beginning may June 1st   apply a small amount of emla cream  to a cotton ball and apply over port site 1-2  Hours prior to chemotherapy . Do not rub in cream . Cover with plastic wrap. 30 g 0  . lisinopril (ZESTRIL) 2.5 MG tablet TAKE 1 TABLET BY MOUTH EVERY DAY 90 tablet 3   No facility-administered medications prior to visit.     Allergies:   Tramadol  Social History   Socioeconomic History  . Marital status: Married    Spouse name: Not on file  . Number of children: Not on file  . Years of education: Not on file  . Highest education level: Not on file  Occupational History  . Not on file  Tobacco Use  . Smoking status: Former Smoker    Packs/day: 1.00    Types: Cigarettes    Quit date: 03/05/2016    Years since quitting: 3.9  . Smokeless tobacco: Never Used  Vaping Use  . Vaping Use: Never used  Substance and Sexual Activity  . Alcohol use: Yes    Alcohol/week: 6.0 standard drinks    Types: 6 Cans of beer per week  . 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 Sexual activity: Not on file  Other Topics Concern  . Not on file  Social History Narrative  . Not on file   Social Determinants of Health   Financial Resource Strain:   . Difficulty of Paying Living Expenses: Not on file  Food Insecurity:   . Worried About Charity fundraiser in the Last Year: Not on  file  . Ran Out of Food in the Last Year: Not on file  Transportation Needs:   . Lack of Transportation (Medical): Not on file  . Lack of Transportation (Non-Medical): Not on file  Physical Activity:   . Days of Exercise per Week: Not on file  . Minutes of Exercise per Session: Not on file  Stress:   . Feeling of Stress : Not on file  Social Connections:   . Frequency of Communication with Friends and Family: Not on file  . Frequency of Social Gatherings with Friends and Family: Not on file  . Attends Religious Services: Not on file  . Active Member of Clubs or Organizations: Not on file  . Attends Archivist Meetings: Not on file  . Marital Status: Not on file     Family History:  The patient's family history includes Diabetes in his mother.     ROS:   Please see the history of present illness.    ROS All other systems reviewed and are negative.   PHYSICAL EXAM:   VS:  BP 116/62   Pulse 96   Ht 6' 3.5" (1.918 m)   Wt 153 lb 6.4 oz (69.6 kg)   SpO2 91%   BMI 18.92 kg/m    GEN: Well nourished, well developed, in no acute distress HEENT: normal Neck: no JVD or masses Cardiac: RRR; soft flow murmur at apex. No rubs, or gallops. 1 + LE edema in ankles  Respiratory:  clear to auscultation bilaterally, normal work of breathing GI: soft, nontender, nondistended, + BS MS: no deformity or atrophy Skin: warm and dry, no rash Neuro:  Alert and Oriented x 3, Strength and sensation are intact Psych: euthymic mood, full affect   Wt Readings from Last 3 Encounters:  03/02/20 153 lb 6.4 oz (69.6 kg)  02/25/20 154 lb 14.4 oz (70.3 kg)  02/04/20 157 lb 1.6 oz (71.3 kg)      Studies/Labs Reviewed:   EKG:  EKG is ordered today.  The ekg ordered today demonstrates sinus with 1st deg AV block, HR 96  Recent Labs: 02/25/2020: ALT 11; BUN 12; Creatinine 1.32; Hemoglobin 12.0; Platelet Count 381; Potassium 4.1; Sodium 134; TSH 1.652   Lipid Panel No results found for:  CHOL, TRIG, HDL, CHOLHDL, VLDL, LDLCALC, LDLDIRECT  Additional studies/ records that were reviewed today include:  TEE: 1. There is 4+ primary mitral regurgitation with myxomatous anterior  mitral valve leaflet, with prolapse of all three scallops. The jet is  posteriorly directed, eccentric with reversal of pulmonary vein forward  flow bilateraly. PISA radius 1.0 cm, ERO  0.45 cm2, regurgitation volume 58 ml. Posterior leaflet measures 1.0 cm.  Flail gap 0.3 cm.  2. Left ventricular ejection fraction, by estimation, is 60 to 65%. The  left ventricle has normal function. The left ventricle has no regional  wall motion abnormalities.  3. Right ventricular systolic function is normal. The right ventricular  size is normal. There is normal pulmonary artery systolic pressure.  4. Left atrial size was moderately dilated. No left atrial/left atrial  appendage thrombus was detected. The LAA emptying velocity was 40 cm/s.  5. Right atrial size was mildly dilated.  6. The mitral valve is myxomatous. No evidence of mitral valve  regurgitation. No evidence of mitral stenosis.  7. Tricuspid valve regurgitation is moderate.  8. The aortic valve is normal in structure. Aortic valve regurgitation is  trivial. No aortic stenosis is present.  9. There is moderate dilatation at the level of the sinuses of Valsalva  measuring 48 mm. There is mild (Grade II) plaque.  10. The inferior vena cava is normal in size with greater than 50%  respiratory variability, suggesting right atrial pressure of 3 mmHg.   Conclusion(s)/Recommendation(s): Normal biventricular function without  evidence of hemodynamically significant valvular heart disease.   FINDINGS  Left Ventricle: Left ventricular ejection fraction, by estimation, is 60  to 65%. The left ventricle has normal function. The left ventricle has no  regional wall motion abnormalities. The left ventricular internal cavity  size was normal in size.  There is  no left ventricular hypertrophy.   Right Ventricle: The right ventricular size is normal. No increase in  right ventricular wall thickness. Right ventricular systolic function is  normal. There is normal pulmonary artery systolic pressure. The tricuspid  regurgitant velocity is 2.37 m/s, and  with an assumed right atrial pressure of 3 mmHg, the estimated right  ventricular systolic pressure is 67.3 mmHg.   Left Atrium: Left atrial size was moderately dilated. No left atrial/left  atrial appendage thrombus was detected. The LAA emptying velocity was 40  cm/s.   Right Atrium: Right atrial size was mildly dilated.   Pericardium: There is no evidence of pericardial effusion.   Mitral Valve: The mitral valve is myxomatous. There is severe holosystolic  prolapse of multiple segments of the anterior leaflet of the mitral valve.  There is severe thickening of the mitral valve leaflet(s). Normal mobility  of the mitral valve leaflets.  No evidence of mitral valve regurgitation. No evidence of mitral valve  stenosis.   Tricuspid Valve: The tricuspid valve is normal in structure. Tricuspid  valve regurgitation is moderate . No evidence of tricuspid stenosis.   Aortic Valve: The aortic valve is normal in structure. Aortic valve  regurgitation is trivial. No aortic stenosis is present.   Pulmonic Valve: The pulmonic valve was normal in structure. Pulmonic valve  regurgitation is not visualized. No evidence of pulmonic stenosis.   Aorta: The ascending aorta was not well visualized. There is moderate  dilatation at the level of the sinuses of Valsalva measuring 48 mm. There  is mild (Grade II) plaque.   Venous: The inferior vena cava is normal in size with greater  than 50%  respiratory variability, suggesting right atrial pressure of 3 mmHg.   IAS/Shunts: No atrial level shunt detected by color flow Doppler.     MR Peak grad:  117.5 mmHg TRICUSPID VALVE  MR Mean grad:   68.0 mmHg  TR Peak grad:  22.5 mmHg  MR Vmax:     542.00 cm/s TR Vmax:    237.00 cm/s  MR Vmean:    369.0 cm/s  MR PISA:     6.28 cm  MR PISA Eff ROA: 45 mm  MR PISA Radius: 1.00 cm   Echo:  1. Left ventricular ejection fraction, by estimation, is 60 to 65%. The  left ventricle has normal function. The left ventricle has no regional  wall motion abnormalities. There is mild left ventricular hypertrophy.  Left ventricular diastolic parameters  are consistent with Grade I diastolic dysfunction (impaired relaxation).  2. Right ventricular systolic function is normal. The right ventricular  size is normal. There is severely elevated pulmonary artery systolic  pressure. The estimated right ventricular systolic pressure is 15.1 mmHg.  3. Left atrial size was moderately dilated.  4. The mitral valve is myxomatous. Severe mitral valve regurgitation.  5. The aortic valve is tricuspid. Aortic valve regurgitation is not  visualized.  6. Aortic dilatation noted. Aneurysm of the ascending aorta, measuring 40  mm. There is moderate to severe dilatation at the level of the sinuses of  Valsalva measuring 49 mm.  7. The inferior vena cava is normal in size with <50% respiratory  variability, suggesting right atrial pressure of 8 mmHg.   Comparison(s): 06/25/18 EF 60-65%. Severe MR. RVSP 29 mmHg. Sinus of Valsava  measured 3.9 cm on the last study, but now measures 4.9 cm. This  previously measured 4.8 cm in 2019. The ascending aorta is stable.   STS RISK CALCULATOR: MV Replacement: Risk of Mortality: 4.092% Renal Failure: 6.034% Permanent Stroke: 1.852% Prolonged Ventilation: 10.995% DSW Infection: 0.096% Reoperation: 5.868% Morbidity or Mortality: 17.778% Short Length of Stay: 18.495% Long Length of Stay: 10.550%   MV Repair: Risk of Mortality: 2.101% Renal Failure: 2.630% Permanent Stroke: 1.327% Prolonged Ventilation: 6.528% DSW  Infection: 0.050% Reoperation: 4.006% Morbidity or Mortality: 11.606% Short Length of Stay: 39.986% Long Length of Stay: 7.179%  _____________________  [facility] Cardiomyopathy Questionnaire  KCCQ-12 03/02/2020  1 a. Ability to shower/bathe Slightly limited  1 b. Ability to walk 1 block Not at all limited  1 c. Ability to hurry/jog Other, Did not do  2. Edema feet/ankles/legs Every morning  3. Limited by fatigue 3+ times per week, not every day  4. Limited by dyspnea 3+ times a week, not every day  5. Sitting up / on 3+ pillows Never over the past 2 weeks  6. Limited enjoyment of life Not limited at all  7. Rest of life w/ symptoms Mostly dissatisfied  8 a. Participation in hobbies Slightly limited  8 b. Participation in chores Did not limit at all  8 c. Visiting family/friends N/A, did not do for other reasons      ASSESSMENT & PLAN:   Severe MR: undergoing work up for TEER. He has stable NYHA class II symptoms of dyspnea on exertion. He had recent BMET and CBC at chemo on 11/4 which were stable. Plan for diagnosic L/RHC on 03/10/20 with Dr. Burt Knack.  I have reviewed the risks, indications, and alternatives to cardiac catheterization and possible angioplasty/stenting with the patient. Risks include but are not limited to bleeding, infection, vascular injury, stroke,  myocardial infection, arrhythmia, kidney injury, radiation-related injury in the case of prolonged fluoroscopy use, emergency cardiac surgery, and death. The patient understands the risks of serious complication is low (<6%).     Medication Adjustments/Labs and Tests Ordered: Current medicines are reviewed at length with the patient today.  Concerns regarding medicines are outlined above.  Medication changes, Labs and Tests ordered today are listed in the Patient Instructions below. Patient Instructions  Medication Instructions:  Your physician recommends that you continue on your current medications as  directed. Please refer to the Current Medication list given to you today.  *If you need a refill on your cardiac medications before your next appointment, please call your pharmacy*   Lab Work: none If you have labs (blood work) drawn today and your tests are completely normal, you will receive your results only by: Marland Kitchen MyChart Message (if you have MyChart) OR . A paper copy in the mail If you have any lab test that is abnormal or we need to change your treatment, we will call you to review the results.   Testing/Procedures: Your physician has requested that you have a cardiac catheterization. Cardiac catheterization is used to diagnose and/or treat various heart conditions. Doctors may recommend this procedure for a number of different reasons. The most common reason is to evaluate chest pain. Chest pain can be a symptom of coronary artery disease (CAD), and cardiac catheterization can show whether plaque is narrowing or blocking your heart's arteries. This procedure is also used to evaluate the valves, as well as measure the blood flow and oxygen levels in different parts of your heart. For further information please visit [email or web address]. Please follow instruction sheet, as given. Scheduled for 03/10/20     Follow-Up: At [facility], you and your health needs are our priority.  As part of our continuing mission to provide you with exceptional heart care, we have created designated Provider Care Teams.  These Care Teams include your primary Cardiologist (physician) and Advanced Practice Providers (APPs -  Physician Assistants and Nurse Practitioners) who all work together to provide you with the care you need, when you need it.  We recommend signing up for the patient portal called "MyChart".  Sign up information is provided on this After Visit Summary.  MyChart is used to connect with patients for Virtual Visits (Telemedicine).  Patients are able to view lab/test results, encounter  notes, upcoming appointments, etc.  Non-urgent messages can be sent to your provider as well.   To learn more about what you can do with MyChart, go to [email or web address].    Your next appointment:   To be determined after catheterization             Signed, Angelena Form, PA-C  03/02/2020 2:00 PM    [address], [place], [place]  [address] Phone: [phone]; Fax: [phone]

## 2020-03-01 NOTE — Progress Notes (Signed)
[facility] [place]                                       Cardiology Office Note    Date:  03/02/2020   ID:  William Kales [name], DOB 08-20-1955, MRN [id number]  PCP:  [name], MD  Cardiologist: Dr. Meda Coffee  CC: follow up severe MR  History of Present Illness:  William BACHAR [name] is a 64 y.o. 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   Patient states that he was first noted to have a heart murmur on physical exam when he was being worked up for primary lung cancer in November 2017 after CT imaging revealed suspicious mass in the left lung following stent graft repair of abdominal aortic aneurysm.  Echocardiogram performed at that time revealed normal left ventricular function with prolapse involving the anterior leaflet of the mitral valve and what was felt to be moderate mitral regurgitation.  The patient underwent uncomplicated left lower lobectomy with wedge resection of the left upper lobe by Dr. 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 The patient has been followed closely ever since by Dr. Earlie Server.  Follow-up CT and PET imaging performed in 2019 revealed disease recurrence with bilateral lung nodules and hypermetabolic destructive lesion in the T4 vertebral body consistent with bony involvement.  The patient has remained on long-term chemotherapy ever since (carboplatin for AUC of 5, Alimta 500 mg/M2 and Keytruda 200 mg IV) and follow-up CT imaging has revealed no progression of disease. He then developed progressive exertional shortness of breath.  Follow-up transthoracic echocardiogram 07/2019 revealed  normal left ventricular function with severe mitral regurgitation and signs of elevated right heart pressures.  TEE October 07, 2019 confirmed the presence of severe mitral regurgitation.  The jet of regurgitation was posterior with overriding prolapse of the anterior leaflet.  There was reversal of flow in the pulmonary veins with PISA radius measured 1.0 cm, ERO 0.45 cm, and regurgitant volume estimated 58 mL. He underwent cardiac surgical evaluation with Dr. Roxy Manns 01/07/2020 and then referred to Dr. Burt Knack for consideration of transcatheter edge-to-edge mitral valve repair as a less invasive treatment option in the context of his lung cancer. Plans were made for [facility] to evaluate for CAD and assess intracardiac hemodynamics.   Today he presents to clinic for follow up. No CP. Hast stable class II dyspnea on exertion. Gets short of breath with activities like walking his dogs or bringing the trash cans to end of the driveway. He has chronic LE edema in ankles since starting chemo. No orthopnea or PND. No dizziness or syncope. No blood in stool or urine. No palpitations.    Past Medical History:  Diagnosis Date  . AAA (abdominal aortic aneurysm) ([place])   . AAA (abdominal aortic aneurysm) without rupture ([place]) 02/04/2014  . Cancer ([place])    lung  . Encounter for antineoplastic chemotherapy 06/06/2016  . History of hepatitis B   . HNP (herniated nucleus pulposus), lumbar    L4 with radiculopathy  . Hypertension   . Lung  cancer ([place]) 05/18/2016  . Malignant neoplasm of lower lobe of left lung ([place]) 04/05/2016  . Mass of left lung   . Mitral insufficiency 04/06/2016   This patient will eventually need MV repair if his prognosis is good from oncology standpoint. However, his lung cancer therapy  is the priority right now. His cardiac condition won't preclude possible lung surgery.  Once his lung cancer is under control he will need a TEE to further evaluate his mitral valve anatomy and MR severity, and also  have ischemic workup as tere is evidence on calcificati  . Renal insufficiency 10/09/2016  . S/P partial lobectomy of lung 05/11/2016  . Varicose veins of legs     Past Surgical History:  Procedure Laterality Date  . ABDOMINAL AORTIC ENDOVASCULAR STENT GRAFT N/A 03/12/2016   Procedure: ABDOMINAL AORTIC ENDOVASCULAR STENT GRAFT;  Surgeon: Conrad H[place], MD;  Location: [place];  Service: Vascular;  Laterality: N/A;  . COLONOSCOPY    . INGUINAL HERNIA REPAIR Left 1975   Left inguinal hernia  . INGUINAL HERNIA REPAIR Right   . IR IMAGING GUIDED PORT INSERTION  09/04/2019  . LOBECTOMY Left 05/11/2016   Procedure: LEFT LOWER LOBE LOBECTOMY AND LEFT UPPER LOBE  RESECTION AND PLACEMENT OF ON-Q;  Surgeon: [name], MD;  Location: [place];  Service: Thoracic;  Laterality: Left;  . LUMBAR LAMINECTOMY  October 22, 2012  . LYMPH NODE DISSECTION Left 05/11/2016   Procedure: LYMPH NODE DISSECTION;  Surgeon: [name], MD;  Location: [place];  Service: Thoracic;  Laterality: Left;  . STAPLING OF BLEBS Left 05/11/2016   Procedure: STAPLING OF APICAL BLEB;  Surgeon: [name], MD;  Location: [place];  Service: Thoracic;  Laterality: Left;  . TEE WITHOUT CARDIOVERSION N/A 10/07/2019   Procedure: TRANSESOPHAGEAL ECHOCARDIOGRAM (TEE);  Surgeon: Dorothy Spark, MD;  [facility] ENDOSCOPY;  Service: Cardiovascular;  Laterality: N/A;  . VIDEO ASSISTED THORACOSCOPY Left 05/18/2016   Procedure: VIDEO ASSISTED THORACOSCOPY WITH REMOVAL OF LEFT APICAL BLEB;  Surgeon: [name], MD;  Location: [place];  Service: Thoracic;  Laterality: Left;  Marland Kitchen VIDEO ASSISTED THORACOSCOPY (VATS)/WEDGE RESECTION Left 05/11/2016   Procedure: LEFT VIDEO ASSISTED THORACOSCOPY (VATS);  Surgeon: [name], MD;  Location: [place];  Service: Thoracic;  Laterality: Left;  Marland Kitchen VIDEO BRONCHOSCOPY N/A 05/11/2016   Procedure: VIDEO BRONCHOSCOPY, LEFT LUNG;  Surgeon: [name], MD;  Location: [place];  Service: Thoracic;   Laterality: N/A;  . VIDEO BRONCHOSCOPY N/A 05/18/2016   Procedure: VIDEO BRONCHOSCOPY WITH BRONCHIAL WASHING;  Surgeon: [name], MD;  Location: [place];  Service: Thoracic;  Laterality: N/A;    Current Medications: Outpatient Medications Prior to Visit  Medication Sig Dispense Refill  . carvedilol (COREG) 6.25 MG tablet Take 1 tablet (6.25 mg total) by mouth 2 (two) times daily. 035 tablet 3  . folic acid (FOLVITE) 1 MG tablet Take 1 tablet (1 mg total) by mouth daily. 90 tablet 1  . lidocaine-prilocaine (EMLA) cream Apply 1 application topically as needed. Beginning may June 1st   apply a small amount of emla cream  to a cotton ball and apply over port site 1-2  Hours prior to chemotherapy . Do not rub in cream . Cover with plastic wrap. 30 g 0  . lisinopril (ZESTRIL) 2.5 MG tablet TAKE 1 TABLET BY MOUTH EVERY DAY 90 tablet 3   No facility-administered medications prior to visit.     Allergies:   Tramadol  Social History   Socioeconomic History  . Marital status: Married    Spouse name: Not on file  . Number of children: Not on file  . Years of education: Not on file  . Highest education level: Not on file  Occupational History  . Not on file  Tobacco Use  . Smoking status: Former Smoker    Packs/day: 1.00    Types: Cigarettes    Quit date: 03/05/2016    Years since quitting: 3.9  . Smokeless tobacco: Never Used  Vaping Use  . Vaping Use: Never used  Substance and Sexual Activity  . Alcohol use: Yes    Alcohol/week: 6.0 standard drinks    Types: 6 Cans of beer per week  . 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 Sexual activity: Not on file  Other Topics Concern  . Not on file  Social History Narrative  . Not on file   Social Determinants of Health   Financial Resource Strain:   . Difficulty of Paying Living Expenses: Not on file  Food Insecurity:   . Worried About Charity fundraiser in the Last Year: Not on  file  . Ran Out of Food in the Last Year: Not on file  Transportation Needs:   . Lack of Transportation (Medical): Not on file  . Lack of Transportation (Non-Medical): Not on file  Physical Activity:   . Days of Exercise per Week: Not on file  . Minutes of Exercise per Session: Not on file  Stress:   . Feeling of Stress : Not on file  Social Connections:   . Frequency of Communication with Friends and Family: Not on file  . Frequency of Social Gatherings with Friends and Family: Not on file  . Attends Religious Services: Not on file  . Active Member of Clubs or Organizations: Not on file  . Attends Archivist Meetings: Not on file  . Marital Status: Not on file     Family History:  The patient's family history includes Diabetes in his mother.     ROS:   Please see the history of present illness.    ROS All other systems reviewed and are negative.   PHYSICAL EXAM:   VS:  BP 116/62   Pulse 96   Ht 6' 3.5" (1.918 m)   Wt 153 lb 6.4 oz (69.6 kg)   SpO2 91%   BMI 18.92 kg/m    GEN: Well nourished, well developed, in no acute distress HEENT: normal Neck: no JVD or masses Cardiac: RRR; soft flow murmur at apex. No rubs, or gallops. 1 + LE edema in ankles  Respiratory:  clear to auscultation bilaterally, normal work of breathing GI: soft, nontender, nondistended, + BS MS: no deformity or atrophy Skin: warm and dry, no rash Neuro:  Alert and Oriented x 3, Strength and sensation are intact Psych: euthymic mood, full affect   Wt Readings from Last 3 Encounters:  03/02/20 153 lb 6.4 oz (69.6 kg)  02/25/20 154 lb 14.4 oz (70.3 kg)  02/04/20 157 lb 1.6 oz (71.3 kg)      Studies/Labs Reviewed:   EKG:  EKG is ordered today.  The ekg ordered today demonstrates sinus with 1st deg AV block, HR 96  Recent Labs: 02/25/2020: ALT 11; BUN 12; Creatinine 1.32; Hemoglobin 12.0; Platelet Count 381; Potassium 4.1; Sodium 134; TSH 1.652   Lipid Panel No results found for:  CHOL, TRIG, HDL, CHOLHDL, VLDL, LDLCALC, LDLDIRECT  Additional studies/ records that were reviewed today include:  TEE: 1. There is 4+ primary mitral regurgitation with myxomatous anterior  mitral valve leaflet, with prolapse of all three scallops. The jet is  posteriorly directed, eccentric with reversal of pulmonary vein forward  flow bilateraly. PISA radius 1.0 cm, ERO  0.45 cm2, regurgitation volume 58 ml. Posterior leaflet measures 1.0 cm.  Flail gap 0.3 cm.  2. Left ventricular ejection fraction, by estimation, is 60 to 65%. The  left ventricle has normal function. The left ventricle has no regional  wall motion abnormalities.  3. Right ventricular systolic function is normal. The right ventricular  size is normal. There is normal pulmonary artery systolic pressure.  4. Left atrial size was moderately dilated. No left atrial/left atrial  appendage thrombus was detected. The LAA emptying velocity was 40 cm/s.  5. Right atrial size was mildly dilated.  6. The mitral valve is myxomatous. No evidence of mitral valve  regurgitation. No evidence of mitral stenosis.  7. Tricuspid valve regurgitation is moderate.  8. The aortic valve is normal in structure. Aortic valve regurgitation is  trivial. No aortic stenosis is present.  9. There is moderate dilatation at the level of the sinuses of Valsalva  measuring 48 mm. There is mild (Grade II) plaque.  10. The inferior vena cava is normal in size with greater than 50%  respiratory variability, suggesting right atrial pressure of 3 mmHg.   Conclusion(s)/Recommendation(s): Normal biventricular function without  evidence of hemodynamically significant valvular heart disease.   FINDINGS  Left Ventricle: Left ventricular ejection fraction, by estimation, is 60  to 65%. The left ventricle has normal function. The left ventricle has no  regional wall motion abnormalities. The left ventricular internal cavity  size was normal in size.  There is  no left ventricular hypertrophy.   Right Ventricle: The right ventricular size is normal. No increase in  right ventricular wall thickness. Right ventricular systolic function is  normal. There is normal pulmonary artery systolic pressure. The tricuspid  regurgitant velocity is 2.37 m/s, and  with an assumed right atrial pressure of 3 mmHg, the estimated right  ventricular systolic pressure is 16.3 mmHg.   Left Atrium: Left atrial size was moderately dilated. No left atrial/left  atrial appendage thrombus was detected. The LAA emptying velocity was 40  cm/s.   Right Atrium: Right atrial size was mildly dilated.   Pericardium: There is no evidence of pericardial effusion.   Mitral Valve: The mitral valve is myxomatous. There is severe holosystolic  prolapse of multiple segments of the anterior leaflet of the mitral valve.  There is severe thickening of the mitral valve leaflet(s). Normal mobility  of the mitral valve leaflets.  No evidence of mitral valve regurgitation. No evidence of mitral valve  stenosis.   Tricuspid Valve: The tricuspid valve is normal in structure. Tricuspid  valve regurgitation is moderate . No evidence of tricuspid stenosis.   Aortic Valve: The aortic valve is normal in structure. Aortic valve  regurgitation is trivial. No aortic stenosis is present.   Pulmonic Valve: The pulmonic valve was normal in structure. Pulmonic valve  regurgitation is not visualized. No evidence of pulmonic stenosis.   Aorta: The ascending aorta was not well visualized. There is moderate  dilatation at the level of the sinuses of Valsalva measuring 48 mm. There  is mild (Grade II) plaque.   Venous: The inferior vena cava is normal in size with greater  than 50%  respiratory variability, suggesting right atrial pressure of 3 mmHg.   IAS/Shunts: No atrial level shunt detected by color flow Doppler.     MR Peak grad:  117.5 mmHg TRICUSPID VALVE  MR Mean grad:   68.0 mmHg  TR Peak grad:  22.5 mmHg  MR Vmax:     542.00 cm/s TR Vmax:    237.00 cm/s  MR Vmean:    369.0 cm/s  MR PISA:     6.28 cm  MR PISA Eff ROA: 45 mm  MR PISA Radius: 1.00 cm   Echo:  1. Left ventricular ejection fraction, by estimation, is 60 to 65%. The  left ventricle has normal function. The left ventricle has no regional  wall motion abnormalities. There is mild left ventricular hypertrophy.  Left ventricular diastolic parameters  are consistent with Grade I diastolic dysfunction (impaired relaxation).  2. Right ventricular systolic function is normal. The right ventricular  size is normal. There is severely elevated pulmonary artery systolic  pressure. The estimated right ventricular systolic pressure is 53.6 mmHg.  3. Left atrial size was moderately dilated.  4. The mitral valve is myxomatous. Severe mitral valve regurgitation.  5. The aortic valve is tricuspid. Aortic valve regurgitation is not  visualized.  6. Aortic dilatation noted. Aneurysm of the ascending aorta, measuring 40  mm. There is moderate to severe dilatation at the level of the sinuses of  Valsalva measuring 49 mm.  7. The inferior vena cava is normal in size with <50% respiratory  variability, suggesting right atrial pressure of 8 mmHg.   Comparison(s): 06/25/18 EF 60-65%. Severe MR. RVSP 29 mmHg. Sinus of Valsava  measured 3.9 cm on the last study, but now measures 4.9 cm. This  previously measured 4.8 cm in 2019. The ascending aorta is stable.   STS RISK CALCULATOR: MV Replacement: Risk of Mortality: 4.092% Renal Failure: 6.034% Permanent Stroke: 1.852% Prolonged Ventilation: 10.995% DSW Infection: 0.096% Reoperation: 5.868% Morbidity or Mortality: 17.778% Short Length of Stay: 18.495% Long Length of Stay: 10.550%   MV Repair: Risk of Mortality: 2.101% Renal Failure: 2.630% Permanent Stroke: 1.327% Prolonged Ventilation: 6.528% DSW  Infection: 0.050% Reoperation: 4.006% Morbidity or Mortality: 11.606% Short Length of Stay: 39.986% Long Length of Stay: 7.179%  _____________________  [facility] Cardiomyopathy Questionnaire  KCCQ-12 03/02/2020  1 a. Ability to shower/bathe Slightly limited  1 b. Ability to walk 1 block Not at all limited  1 c. Ability to hurry/jog Other, Did not do  2. Edema feet/ankles/legs Every morning  3. Limited by fatigue 3+ times per week, not every day  4. Limited by dyspnea 3+ times a week, not every day  5. Sitting up / on 3+ pillows Never over the past 2 weeks  6. Limited enjoyment of life Not limited at all  7. Rest of life w/ symptoms Mostly dissatisfied  8 a. Participation in hobbies Slightly limited  8 b. Participation in chores Did not limit at all  8 c. Visiting family/friends N/A, did not do for other reasons      ASSESSMENT & PLAN:   Severe MR: undergoing work up for TEER. He has stable NYHA class II symptoms of dyspnea on exertion. He had recent BMET and CBC at chemo on 11/4 which were stable. Plan for diagnosic L/RHC on 03/10/20 with Dr. Burt Knack.  I have reviewed the risks, indications, and alternatives to cardiac catheterization and possible angioplasty/stenting with the patient. Risks include but are not limited to bleeding, infection, vascular injury, stroke,  myocardial infection, arrhythmia, kidney injury, radiation-related injury in the case of prolonged fluoroscopy use, emergency cardiac surgery, and death. The patient understands the risks of serious complication is low (<2%).     Medication Adjustments/Labs and Tests Ordered: Current medicines are reviewed at length with the patient today.  Concerns regarding medicines are outlined above.  Medication changes, Labs and Tests ordered today are listed in the Patient Instructions below. Patient Instructions  Medication Instructions:  Your physician recommends that you continue on your current medications as  directed. Please refer to the Current Medication list given to you today.  *If you need a refill on your cardiac medications before your next appointment, please call your pharmacy*   Lab Work: none If you have labs (blood work) drawn today and your tests are completely normal, you will receive your results only by: Marland Kitchen MyChart Message (if you have MyChart) OR . A paper copy in the mail If you have any lab test that is abnormal or we need to change your treatment, we will call you to review the results.   Testing/Procedures: Your physician has requested that you have a cardiac catheterization. Cardiac catheterization is used to diagnose and/or treat various heart conditions. Doctors may recommend this procedure for a number of different reasons. The most common reason is to evaluate chest pain. Chest pain can be a symptom of coronary artery disease (CAD), and cardiac catheterization can show whether plaque is narrowing or blocking your heart's arteries. This procedure is also used to evaluate the valves, as well as measure the blood flow and oxygen levels in different parts of your heart. For further information please visit HugeFiesta.[email or web address]. Please follow instruction sheet, as given. Scheduled for 03/10/20     Follow-Up: At [facility], you and your health needs are our priority.  As part of our continuing mission to provide you with exceptional heart care, we have created designated Provider Care Teams.  These Care Teams include your primary Cardiologist (physician) and Advanced Practice Providers (APPs -  Physician Assistants and Nurse Practitioners) who all work together to provide you with the care you need, when you need it.  We recommend signing up for the patient portal called "MyChart".  Sign up information is provided on this After Visit Summary.  MyChart is used to connect with patients for Virtual Visits (Telemedicine).  Patients are able to view lab/test results, encounter  notes, upcoming appointments, etc.  Non-urgent messages can be sent to your provider as well.   To learn more about what you can do with MyChart, go to [email or web address].    Your next appointment:   To be determined after catheterization             Signed, Angelena Form, PA-C  03/02/2020 2:00 PM    [address], [place], [place]  [address] Phone: [phone]; Fax: [phone]

## 2020-03-02 ENCOUNTER — Ambulatory Visit (INDEPENDENT_AMBULATORY_CARE_PROVIDER_SITE_OTHER): Payer: No Typology Code available for payment source | Admitting: Physician Assistant

## 2020-03-02 ENCOUNTER — Encounter: Payer: Self-pay | Admitting: Physician Assistant

## 2020-03-02 ENCOUNTER — Other Ambulatory Visit: Payer: Self-pay

## 2020-03-02 VITALS — BP 116/62 | HR 96 | Ht 75.5 in | Wt 153.4 lb

## 2020-03-02 DIAGNOSIS — I340 Nonrheumatic mitral (valve) insufficiency: Secondary | ICD-10-CM

## 2020-03-02 NOTE — Addendum Note (Signed)
Addended by: Eileen Stanford on: 03/02/2020 02:04 PM   Modules accepted: Level of Service

## 2020-03-02 NOTE — Patient Instructions (Addendum)
Medication Instructions:  Your physician recommends that you continue on your current medications as directed. Please refer to the Current Medication list given to you today.  *If you need a refill on your cardiac medications before your next appointment, please call your pharmacy*   Lab Work: none If you have labs (blood work) drawn today and your tests are completely normal, you will receive your results only by: Marland Kitchen MyChart Message (if you have MyChart) OR . A paper copy in the mail If you have any lab test that is abnormal or we need to change your treatment, we will call you to review the results.   Testing/Procedures: Your physician has requested that you have a cardiac catheterization. Cardiac catheterization is used to diagnose and/or treat various heart conditions. Doctors may recommend this procedure for a number of different reasons. The most common reason is to evaluate chest pain. Chest pain can be a symptom of coronary artery disease (CAD), and cardiac catheterization can show whether plaque is narrowing or blocking your heart's arteries. This procedure is also used to evaluate the valves, as well as measure the blood flow and oxygen levels in different parts of your heart. For further information please visit HugeFiesta.[email or web address]. Please follow instruction sheet, as given. Scheduled for 03/10/20     Follow-Up: At [facility], you and your health needs are our priority.  As part of our continuing mission to provide you with exceptional heart care, we have created designated Provider Care Teams.  These Care Teams include your primary Cardiologist (physician) and Advanced Practice Providers (APPs -  Physician Assistants and Nurse Practitioners) who all work together to provide you with the care you need, when you need it.  We recommend signing up for the patient portal called "MyChart".  Sign up information is provided on this After Visit Summary.  MyChart is used to connect with  patients for Virtual Visits (Telemedicine).  Patients are able to view lab/test results, encounter notes, upcoming appointments, etc.  Non-urgent messages can be sent to your provider as well.   To learn more about what you can do with MyChart, go to [email or web address].    Your next appointment:   To be determined after catheterization

## 2020-03-08 ENCOUNTER — Telehealth: Payer: Self-pay | Admitting: *Deleted

## 2020-03-08 ENCOUNTER — Other Ambulatory Visit (HOSPITAL_COMMUNITY)
Admission: RE | Admit: 2020-03-08 | Discharge: 2020-03-08 | Disposition: A | Discharge status: Home / Self Care | Payer: No Typology Code available for payment source | Source: Ambulatory Visit | Attending: Cardiovascular Disease | Admitting: Cardiovascular Disease

## 2020-03-08 DIAGNOSIS — Z01812 Encounter for preprocedural laboratory examination: Secondary | ICD-10-CM | POA: Insufficient documentation

## 2020-03-08 DIAGNOSIS — Z20822 Contact with and (suspected) exposure to covid-19: Secondary | ICD-10-CM | POA: Insufficient documentation

## 2020-03-08 LAB — SARS CORONAVIRUS 2 (TAT 6-24 HRS): SARS Coronavirus 2: NEGATIVE

## 2020-03-08 NOTE — Telephone Encounter (Signed)
Pt contacted pre-catheterization scheduled at [facility] for: Thursday March 10, 2020 7:30 AM Verified arrival time and place: [facility]) at: 5:30 AM   No solid food after midnight prior to cath, clear liquids until 5 AM day of procedure.  Hold: Lisinopril- day before and day of procedure -GFR 59  Except hold medications AM meds can be  taken pre-cath with sips of water including: ASA 81 mg   Confirmed patient has responsible adult to drive home post procedure and be with patient first 24 hours after arriving home: yes  You are allowed ONE visitor in the waiting room during the time you are at the hospital for your procedure. Both you and your visitor must wear a mask once you enter the hospital.       COVID-19 Pre-Screening Questions:  . In the past 14 days have you had a new cough, new headache, new nasal congestion, fever (100.4 or greater) unexplained body aches, new sore throat, or sudden loss of taste or sense of smell? no . In the past 14 days have you been around anyone with known Covid 19? no    Reviewed procedure/mask/visitor instructions, COVID-19 questions with patient.

## 2020-03-10 ENCOUNTER — Encounter (HOSPITAL_COMMUNITY): Payer: Self-pay | Admitting: Cardiovascular Disease

## 2020-03-10 ENCOUNTER — Ambulatory Visit (HOSPITAL_COMMUNITY)
Admission: RE | Admit: 2020-03-10 | Discharge: 2020-03-10 | Disposition: A | Discharge status: Home / Self Care | Payer: No Typology Code available for payment source | Attending: Cardiovascular Disease | Admitting: Cardiovascular Disease

## 2020-03-10 ENCOUNTER — Encounter (HOSPITAL_COMMUNITY)
Admission: RE | Disposition: A | Discharge status: Home / Self Care | Payer: Self-pay | Source: Home / Self Care | Attending: Cardiovascular Disease

## 2020-03-10 ENCOUNTER — Other Ambulatory Visit: Payer: Self-pay

## 2020-03-10 DIAGNOSIS — R0609 Other forms of dyspnea: Secondary | ICD-10-CM | POA: Insufficient documentation

## 2020-03-10 DIAGNOSIS — I340 Nonrheumatic mitral (valve) insufficiency: Secondary | ICD-10-CM | POA: Diagnosis present

## 2020-03-10 DIAGNOSIS — Z885 Allergy status to narcotic agent status: Secondary | ICD-10-CM | POA: Insufficient documentation

## 2020-03-10 DIAGNOSIS — Z87891 Personal history of nicotine dependence: Secondary | ICD-10-CM | POA: Diagnosis not present

## 2020-03-10 DIAGNOSIS — I2510 Atherosclerotic heart disease of native coronary artery without angina pectoris: Secondary | ICD-10-CM | POA: Insufficient documentation

## 2020-03-10 DIAGNOSIS — Z79899 Other long term (current) drug therapy: Secondary | ICD-10-CM | POA: Insufficient documentation

## 2020-03-10 DIAGNOSIS — I083 Combined rheumatic disorders of mitral, aortic and tricuspid valves: Secondary | ICD-10-CM | POA: Insufficient documentation

## 2020-03-10 HISTORY — PX: RIGHT/LEFT HEART CATH AND CORONARY ANGIOGRAPHY: CATH118266

## 2020-03-10 LAB — POCT I-STAT EG7
Acid-base deficit: 3 mmol/L — ABNORMAL HIGH (ref 0.0–2.0)
Bicarbonate: 22.5 mmol/L (ref 20.0–28.0)
Calcium, Ion: 1.23 mmol/L (ref 1.15–1.40)
HCT: 38 % — ABNORMAL LOW (ref 39.0–52.0)
Hemoglobin: 12.9 g/dL — ABNORMAL LOW (ref 13.0–17.0)
O2 Saturation: 67 %
Potassium: 4.3 mmol/L (ref 3.5–5.1)
Sodium: 136 mmol/L (ref 135–145)
TCO2: 24 mmol/L (ref 22–32)
pCO2, Ven: 41.7 mmHg — ABNORMAL LOW (ref 44.0–60.0)
pH, Ven: 7.34 (ref 7.250–7.430)
pO2, Ven: 37 mmHg (ref 32.0–45.0)

## 2020-03-10 LAB — POCT I-STAT 7, (LYTES, BLD GAS, ICA,H+H)
Acid-base deficit: 3 mmol/L — ABNORMAL HIGH (ref 0.0–2.0)
Bicarbonate: 21.6 mmol/L (ref 20.0–28.0)
Calcium, Ion: 1.22 mmol/L (ref 1.15–1.40)
HCT: 30 % — ABNORMAL LOW (ref 39.0–52.0)
Hemoglobin: 10.2 g/dL — ABNORMAL LOW (ref 13.0–17.0)
O2 Saturation: 99 %
Potassium: 4.3 mmol/L (ref 3.5–5.1)
Sodium: 136 mmol/L (ref 135–145)
TCO2: 23 mmol/L (ref 22–32)
pCO2 arterial: 37.4 mmHg (ref 32.0–48.0)
pH, Arterial: 7.368 (ref 7.350–7.450)
pO2, Arterial: 118 mmHg — ABNORMAL HIGH (ref 83.0–108.0)

## 2020-03-10 LAB — POCT ACTIVATED CLOTTING TIME: Activated Clotting Time: 0 seconds

## 2020-03-10 SURGERY — RIGHT/LEFT HEART CATH AND CORONARY ANGIOGRAPHY
Anesthesia: LOCAL

## 2020-03-10 MED ORDER — VERAPAMIL HCL 2.5 MG/ML IV SOLN
INTRAVENOUS | Status: AC
  Filled 2020-03-10: qty 2

## 2020-03-10 MED ORDER — SODIUM CHLORIDE 0.9% FLUSH: 3.0000 mL | Freq: Two times a day (BID) | INTRAVENOUS | Status: DC

## 2020-03-10 MED ORDER — IOHEXOL 350 MG/ML SOLN
INTRAVENOUS | Status: DC | PRN
  Administered 2020-03-10: 45 mL

## 2020-03-10 MED ORDER — SODIUM CHLORIDE 0.9% FLUSH: 3.0000 mL | INTRAVENOUS | Status: DC | PRN

## 2020-03-10 MED ORDER — HEPARIN SODIUM (PORCINE) 1000 UNIT/ML IJ SOLN
INTRAMUSCULAR | Status: AC
  Filled 2020-03-10: qty 1

## 2020-03-10 MED ORDER — HEPARIN SODIUM (PORCINE) 1000 UNIT/ML IJ SOLN
INTRAMUSCULAR | Status: DC | PRN
  Administered 2020-03-10: 4000 [IU] via INTRAVENOUS

## 2020-03-10 MED ORDER — FENTANYL CITRATE (PF) 100 MCG/2ML IJ SOLN
INTRAMUSCULAR | Status: DC | PRN
  Administered 2020-03-10: 25 ug via INTRAVENOUS

## 2020-03-10 MED ORDER — HEPARIN (PORCINE) IN NACL 1000-0.9 UT/500ML-% IV SOLN
INTRAVENOUS | Status: DC | PRN
  Administered 2020-03-10 (×2): 500 mL

## 2020-03-10 MED ORDER — MIDAZOLAM HCL 2 MG/2ML IJ SOLN
INTRAMUSCULAR | Status: AC
  Filled 2020-03-10: qty 2

## 2020-03-10 MED ORDER — SODIUM CHLORIDE 0.9 % WEIGHT BASED INFUSION: 1.0000 mL/kg/h | INTRAVENOUS | Status: DC

## 2020-03-10 MED ORDER — VERAPAMIL HCL 2.5 MG/ML IV SOLN
INTRAVENOUS | Status: DC | PRN
  Administered 2020-03-10: 10 mL via INTRA_ARTERIAL

## 2020-03-10 MED ORDER — ASPIRIN 81 MG PO CHEW: 81.0000 mg | CHEWABLE_TABLET | ORAL | Status: AC

## 2020-03-10 MED ORDER — HEPARIN (PORCINE) IN NACL 1000-0.9 UT/500ML-% IV SOLN
INTRAVENOUS | Status: AC
  Filled 2020-03-10: qty 1000

## 2020-03-10 MED ORDER — FENTANYL CITRATE (PF) 100 MCG/2ML IJ SOLN
INTRAMUSCULAR | Status: AC
  Filled 2020-03-10: qty 2

## 2020-03-10 MED ORDER — LIDOCAINE HCL (PF) 1 % IJ SOLN
INTRAMUSCULAR | Status: DC | PRN
  Administered 2020-03-10 (×2): 2 mL

## 2020-03-10 MED ORDER — MIDAZOLAM HCL 2 MG/2ML IJ SOLN
INTRAMUSCULAR | Status: DC | PRN
  Administered 2020-03-10: 1 mg via INTRAVENOUS

## 2020-03-10 MED ORDER — SODIUM CHLORIDE 0.9 % IV SOLN: 250.0000 mL | INTRAVENOUS | Status: DC | PRN

## 2020-03-10 MED ORDER — SODIUM CHLORIDE 0.9 % WEIGHT BASED INFUSION
3.0000 mL/kg/h | INTRAVENOUS | Status: AC
  Administered 2020-03-10: 3 mL/kg/h via INTRAVENOUS

## 2020-03-10 MED ORDER — LIDOCAINE HCL (PF) 1 % IJ SOLN
INTRAMUSCULAR | Status: AC
  Filled 2020-03-10: qty 30

## 2020-03-10 SURGICAL SUPPLY — 13 items
CATH 5FR JL3.5 JR4 ANG PIG MP (CATHETERS) ×2 IMPLANT
CATH BALLN WEDGE 5F 110CM (CATHETERS) ×2 IMPLANT
DEVICE RAD COMP TR BAND LRG (VASCULAR PRODUCTS) ×2 IMPLANT
GLIDESHEATH SLEND SS 6F .021 (SHEATH) ×2 IMPLANT
GUIDEWIRE INQWIRE 1.5J.035X260 (WIRE) ×1 IMPLANT
INQWIRE 1.5J .035X260CM (WIRE) ×2
KIT HEART LEFT (KITS) ×2 IMPLANT
PACK CARDIAC CATHETERIZATION (CUSTOM PROCEDURE TRAY) ×2 IMPLANT
SHEATH GLIDE SLENDER 4/5FR (SHEATH) ×2 IMPLANT
TRANSDUCER W/STOPCOCK (MISCELLANEOUS) ×2 IMPLANT
TUBING CIL FLEX 10 FLL-RA (TUBING) ×2 IMPLANT
WIRE EMERALD 3MM-J .025X260CM (WIRE) ×2 IMPLANT
WIRE HI TORQ VERSACORE-J 145CM (WIRE) ×2 IMPLANT

## 2020-03-10 NOTE — Progress Notes (Signed)
Discharge instructions reviewed with patient and family. Verbalized understanding. 

## 2020-03-10 NOTE — Interval H&P Note (Signed)
Cath Lab Visit (complete for each Cath Lab visit)  Clinical Evaluation Leading to the Procedure:   ACS: No.  Non-ACS:    Anginal Classification: No Symptoms  Anti-ischemic medical therapy: No Therapy  Non-Invasive Test Results: No non-invasive testing performed  Prior CABG: No previous CABG      History and Physical Interval Note:  03/10/2020 7:47 AM  William Kales [name]  has presented today for surgery, with the diagnosis of mitral reg.  The various methods of treatment have been discussed with the patient and family. After consideration of risks, benefits and other options for treatment, the patient has consented to  Procedure(s): RIGHT/LEFT HEART CATH AND CORONARY ANGIOGRAPHY (N/A) as a surgical intervention.  The patient's history has been reviewed, patient examined, no change in status, stable for surgery.  I have reviewed the patient's chart and labs.  Questions were answered to the patient's satisfaction.     Sherren Mocha

## 2020-03-10 NOTE — Discharge Instructions (Signed)
Radial Site Care  This sheet gives you information about how to care for yourself after your procedure. Your health care provider may also give you more specific instructions. If you have problems or questions, contact your health care provider. What can I expect after the procedure? After the procedure, it is common to have:  Bruising and tenderness at the catheter insertion area. Follow these instructions at home: Medicines  Take over-the-counter and prescription medicines only as told by your health care provider. Insertion site care  Follow instructions from your health care provider about how to take care of your insertion site. Make sure you: ? Wash your hands with soap and water before you change your bandage (dressing). If soap and water are not available, use hand sanitizer. ? Change your dressing as told by your health care provider. ? Leave stitches (sutures), skin glue, or adhesive strips in place. These skin closures may need to stay in place for 2 weeks or longer. If adhesive strip edges start to loosen and curl up, you may trim the loose edges. Do not remove adhesive strips completely unless your health care provider tells you to do that.  Check your insertion site every day for signs of infection. Check for: ? Redness, swelling, or pain. ? Fluid or blood. ? Pus or a bad smell. ? Warmth.  Do not take baths, swim, or use a hot tub until your health care provider approves.  You may shower 24-48 hours after the procedure, or as directed by your health care provider. ? Remove the dressing and gently wash the site with plain soap and water. ? Pat the area dry with a clean towel. ? Do not rub the site. That could cause bleeding.  Do not apply powder or lotion to the site. Activity   For 24 hours after the procedure, or as directed by your health care provider: ? Do not flex or bend the affected arm. ? Do not push or pull heavy objects with the affected arm. ? Do not  drive yourself home from the hospital or clinic. You may drive 24 hours after the procedure unless your health care provider tells you not to. ? Do not operate machinery or power tools.  Do not lift anything that is heavier than 10 lb (4.5 kg), or the limit that you are told, until your health care provider says that it is safe.  Ask your health care provider when it is okay to: ? Return to work or school. ? Resume usual physical activities or sports. ? Resume sexual activity. General instructions  If the catheter site starts to bleed, raise your arm and put firm pressure on the site. If the bleeding does not stop, get help right away. This is a medical emergency.  If you went home on the same day as your procedure, a responsible adult should be with you for the first 24 hours after you arrive home.  Keep all follow-up visits as told by your health care provider. This is important. Contact a health care provider if:  You have a fever.  You have redness, swelling, or yellow drainage around your insertion site. Get help right away if:  You have unusual pain at the radial site.  The catheter insertion area swells very fast.  The insertion area is bleeding, and the bleeding does not stop when you hold steady pressure on the area.  Your arm or hand becomes pale, cool, tingly, or numb. These symptoms may represent a serious problem   that is an emergency. Do not wait to see if the symptoms will go away. Get medical help right away. Call your local emergency services (911 in the [place] Do not drive yourself to the hospital. Summary  After the procedure, it is common to have bruising and tenderness at the site.  Follow instructions from your health care provider about how to take care of your radial site wound. Check the wound every day for signs of infection.  Do not lift anything that is heavier than 10 lb (4.5 kg), or the limit that you are told, until your health care provider says  that it is safe. This information is not intended to replace advice given to you by your health care provider. Make sure you discuss any questions you have with your health care provider. Document Revised: 05/15/2017 Document Reviewed: 05/15/2017 Elsevier Patient Education  2020 [employer]  

## 2020-03-11 ENCOUNTER — Telehealth: Payer: Self-pay

## 2020-03-11 MED FILL — Verapamil HCl IV Soln 2.5 MG/ML: INTRAVENOUS | Qty: 2 | Status: AC

## 2020-03-11 MED FILL — Lidocaine HCl Local Preservative Free (PF) Inj 1%: INTRAMUSCULAR | Qty: 30 | Status: AC

## 2020-03-11 MED FILL — Heparin Sodium (Porcine) Inj 1000 Unit/ML: INTRAMUSCULAR | Qty: 10 | Status: AC

## 2020-03-11 NOTE — Telephone Encounter (Signed)
The patient cancelled appointment 11/24 with Dr. Burt Knack because he will be getting his infusion that day. Called and scheduled the patient 11/29 with Dr. Burt Knack. He was grateful for assistance.

## 2020-03-16 ENCOUNTER — Other Ambulatory Visit: Payer: Self-pay

## 2020-03-16 ENCOUNTER — Encounter: Payer: Self-pay | Admitting: Internal Medicine

## 2020-03-16 ENCOUNTER — Inpatient Hospital Stay: Payer: No Typology Code available for payment source

## 2020-03-16 ENCOUNTER — Ambulatory Visit: Payer: Medicare Other | Admitting: Cardiovascular Disease

## 2020-03-16 ENCOUNTER — Inpatient Hospital Stay (HOSPITAL_BASED_OUTPATIENT_CLINIC_OR_DEPARTMENT_OTHER): Payer: No Typology Code available for payment source | Admitting: Internal Medicine

## 2020-03-16 VITALS — BP 127/79 | HR 79 | Temp 97.8°F | Resp 17 | Ht 75.5 in | Wt 158.5 lb

## 2020-03-16 DIAGNOSIS — C3492 Malignant neoplasm of unspecified part of left bronchus or lung: Secondary | ICD-10-CM

## 2020-03-16 DIAGNOSIS — Z5112 Encounter for antineoplastic immunotherapy: Secondary | ICD-10-CM

## 2020-03-16 DIAGNOSIS — Z5111 Encounter for antineoplastic chemotherapy: Secondary | ICD-10-CM

## 2020-03-16 DIAGNOSIS — C3432 Malignant neoplasm of lower lobe, left bronchus or lung: Secondary | ICD-10-CM

## 2020-03-16 LAB — CBC WITH DIFFERENTIAL (CANCER CENTER ONLY)
Abs Immature Granulocytes: 0.06 10*3/uL (ref 0.00–0.07)
Basophils Absolute: 0 10*3/uL (ref 0.0–0.1)
Basophils Relative: 1 %
Eosinophils Absolute: 0.4 10*3/uL (ref 0.0–0.5)
Eosinophils Relative: 4 %
HCT: 35 % — ABNORMAL LOW (ref 39.0–52.0)
Hemoglobin: 11.8 g/dL — ABNORMAL LOW (ref 13.0–17.0)
Immature Granulocytes: 1 %
Lymphocytes Relative: 12 %
Lymphs Abs: 1.1 10*3/uL (ref 0.7–4.0)
MCH: 34.6 pg — ABNORMAL HIGH (ref 26.0–34.0)
MCHC: 33.7 g/dL (ref 30.0–36.0)
MCV: 102.6 fL — ABNORMAL HIGH (ref 80.0–100.0)
Monocytes Absolute: 1.4 10*3/uL — ABNORMAL HIGH (ref 0.1–1.0)
Monocytes Relative: 17 %
Neutro Abs: 5.6 10*3/uL (ref 1.7–7.7)
Neutrophils Relative %: 65 %
Platelet Count: 517 10*3/uL — ABNORMAL HIGH (ref 150–400)
RBC: 3.41 MIL/uL — ABNORMAL LOW (ref 4.22–5.81)
RDW: 15.5 % (ref 11.5–15.5)
WBC Count: 8.5 10*3/uL (ref 4.0–10.5)
nRBC: 0 % (ref 0.0–0.2)

## 2020-03-16 LAB — CMP (CANCER CENTER ONLY)
ALT: 14 U/L (ref 0–44)
AST: 32 U/L (ref 15–41)
Albumin: 3 g/dL — ABNORMAL LOW (ref 3.5–5.0)
Alkaline Phosphatase: 78 U/L (ref 38–126)
Anion gap: 7 (ref 5–15)
BUN: 13 mg/dL (ref 8–23)
CO2: 20 mmol/L — ABNORMAL LOW (ref 22–32)
Calcium: 8.6 mg/dL — ABNORMAL LOW (ref 8.9–10.3)
Chloride: 108 mmol/L (ref 98–111)
Creatinine: 1.36 mg/dL — ABNORMAL HIGH (ref 0.61–1.24)
GFR, Estimated: 57 mL/min — ABNORMAL LOW (ref >60)
Glucose, Bld: 96 mg/dL (ref 70–99)
Potassium: 4.1 mmol/L (ref 3.5–5.1)
Sodium: 135 mmol/L (ref 135–145)
Total Bilirubin: 0.4 mg/dL (ref 0.3–1.2)
Total Protein: 6.5 g/dL (ref 6.5–8.1)

## 2020-03-16 LAB — TSH: TSH: 2.21 u[IU]/mL (ref 0.320–4.118)

## 2020-03-16 MED ORDER — HEPARIN SOD (PORK) LOCK FLUSH 100 UNIT/ML IV SOLN
500.0000 [IU] | Freq: Once | INTRAVENOUS | Status: AC | PRN
  Administered 2020-03-16: 500 [IU]
  Filled 2020-03-16: qty 5

## 2020-03-16 MED ORDER — SODIUM CHLORIDE 0.9 % IV SOLN
200.0000 mg | Freq: Once | INTRAVENOUS | Status: AC
  Administered 2020-03-16: 200 mg via INTRAVENOUS
  Filled 2020-03-16: qty 8

## 2020-03-16 MED ORDER — SODIUM CHLORIDE 0.9 % IV SOLN
500.0000 mg/m2 | Freq: Once | INTRAVENOUS | Status: AC
  Administered 2020-03-16: 1000 mg via INTRAVENOUS
  Filled 2020-03-16: qty 40

## 2020-03-16 MED ORDER — SODIUM CHLORIDE 0.9 % IV SOLN
10.0000 mg | Freq: Once | INTRAVENOUS | Status: AC
  Administered 2020-03-16: 10 mg via INTRAVENOUS
  Filled 2020-03-16: qty 10

## 2020-03-16 MED ORDER — SODIUM CHLORIDE 0.9% FLUSH
10.0000 mL | INTRAVENOUS | Status: DC | PRN
  Administered 2020-03-16: 10 mL
  Filled 2020-03-16: qty 10

## 2020-03-16 MED ORDER — ONDANSETRON HCL 4 MG/2ML IJ SOLN
8.0000 mg | Freq: Once | INTRAMUSCULAR | Status: AC
  Administered 2020-03-16: 8 mg via INTRAVENOUS

## 2020-03-16 MED ORDER — SODIUM CHLORIDE 0.9 % IV SOLN
Freq: Once | INTRAVENOUS | Status: AC
  Filled 2020-03-16: qty 250

## 2020-03-16 MED ORDER — ONDANSETRON HCL 4 MG/2ML IJ SOLN
INTRAMUSCULAR | Status: AC
  Filled 2020-03-16: qty 4

## 2020-03-16 NOTE — Patient Instructions (Signed)

## 2020-03-16 NOTE — Progress Notes (Signed)
[facility] [phone]   [phone]  OFFICE PROGRESS NOTE  [name], MD [address] [place] [address]  DIAGNOSIS: Metastatic non-small cell lung cancer, adenocarcinoma initially diagnosed as stage IB (T2a, N0, M0) non-small cell lung cancer, adenocarcinoma diagnosed in December 2017.  The patient has evidence for disease recurrence in July 2019.  Biomarker Findings Tumor Mutational Burden - TMB-Intermediate (16 Muts/Mb) Microsatellite status - MS-Stable Genomic Findings For a complete list of the genes assayed, please refer to the Appendix. KIT amplification KRAS W38L SMAD4 splice site [id number]>J GO11 I232F 7 Disease relevant genes with no reportable alterations: EGFR, ALK, BRAF, MET, RET, ERBB2, ROS1   PDL 1 expression is 0%  PRIOR THERAPY:  1) status post left lower lobectomy as well as wedge resection of the left upper lobe on 05/11/2016. 2) Adjuvant systemic chemotherapy with cisplatin 75 MG/M2 and Alimta 500 MG/M2 every 3 weeks. First dose 07/03/2016. Status post 4 cycles.  CURRENT THERAPY: Systemic chemotherapy with carboplatin for AUC of 5, Alimta 500 mg/M2 and Keytruda 200 mg IV every 3 weeks.  Status post 38 cycles.  Starting from cycle #5 the patient will be treated with maintenance Alimta and Ketruda (pembrolizumab) every 3 weeks.  INTERVAL HISTORY: William Kales [name] 64 y.o. male returns to the clinic today for follow-up visit.  The patient is feeling fine today with no concerning complaints.  He underwent cardiac catheterization for the mitral valve regurgitation.  He denied having any current chest pain, shortness of breath, cough or hemoptysis.  He denied having any fever or chills.  He has no nausea, vomiting, diarrhea or constipation.  He has no headache or visual changes.  He is here today for evaluation before starting cycle #39.  Marland Kitchen  MEDICAL HISTORY: Past Medical History:  Diagnosis Date  . AAA  (abdominal aortic aneurysm) ([facility])   . AAA (abdominal aortic aneurysm) without rupture ([facility]) 02/04/2014  . Cancer ([facility])    lung  . Encounter for antineoplastic chemotherapy 06/06/2016  . History of hepatitis B   . HNP (herniated nucleus pulposus), lumbar    L4 with radiculopathy  . Hypertension   . Lung cancer ([facility]) 05/18/2016  . Malignant neoplasm of lower lobe of left lung ([place] [facility]) 04/05/2016  . Mass of left lung   . Mitral insufficiency 04/06/2016   This patient will eventually need MV repair if his prognosis is good from oncology standpoint. However, his lung cancer therapy  is the priority right now. His cardiac condition won't preclude possible lung surgery.  Once his lung cancer is under control he will need a TEE to further evaluate his mitral valve anatomy and MR severity, and also have ischemic workup as tere is evidence on calcificati  . Renal insufficiency 10/09/2016  . S/P partial lobectomy of lung 05/11/2016  . Varicose veins of legs     ALLERGIES:  is allergic to tramadol.  MEDICATIONS:  Current Outpatient Medications  Medication Sig Dispense Refill  . carvedilol (COREG) 6.25 MG tablet Take 1 tablet (6.25 mg total) by mouth 2 (two) times daily. 572 tablet 3  . folic acid (FOLVITE) 1 MG tablet Take 1 tablet (1 mg total) by mouth daily. 90 tablet 1  . lisinopril (ZESTRIL) 2.5 MG tablet TAKE 1 TABLET BY MOUTH EVERY DAY (Patient taking differently: Take 2.5 mg by mouth daily. ) 90 tablet 3   No current facility-administered medications for this visit.  SURGICAL HISTORY:  Past Surgical History:  Procedure Laterality Date  . ABDOMINAL AORTIC ENDOVASCULAR STENT GRAFT N/A 03/12/2016   Procedure: ABDOMINAL AORTIC ENDOVASCULAR STENT GRAFT;  Surgeon: Conrad Butler, MD;  Location: [facility];  Service: Vascular;  Laterality: N/A;  . COLONOSCOPY    . INGUINAL HERNIA REPAIR Left 1975   Left inguinal hernia  . INGUINAL HERNIA REPAIR Right   . IR IMAGING GUIDED PORT INSERTION  09/04/2019   . LOBECTOMY Left 05/11/2016   Procedure: LEFT LOWER LOBE LOBECTOMY AND LEFT UPPER LOBE  RESECTION AND PLACEMENT OF ON-Q;  Surgeon: [name], MD;  Location: [facility];  Service: Thoracic;  Laterality: Left;  . LUMBAR LAMINECTOMY  October 22, 2012  . LYMPH NODE DISSECTION Left 05/11/2016   Procedure: LYMPH NODE DISSECTION;  Surgeon: [name], MD;  Location: [facility];  Service: Thoracic;  Laterality: Left;  . RIGHT/LEFT HEART CATH AND CORONARY ANGIOGRAPHY N/A 03/10/2020   Procedure: RIGHT/LEFT HEART CATH AND CORONARY ANGIOGRAPHY;  Surgeon: Sherren Mocha, MD;  Location: [facility];  Service: Cardiovascular;  Laterality: N/A;  . STAPLING OF BLEBS Left 05/11/2016   Procedure: STAPLING OF APICAL BLEB;  Surgeon: [name], MD;  Location: [facility];  Service: Thoracic;  Laterality: Left;  . TEE WITHOUT CARDIOVERSION N/A 10/07/2019   Procedure: TRANSESOPHAGEAL ECHOCARDIOGRAM (TEE);  Surgeon: Dorothy Spark, MD;  Location[facility] ENDOS[facility];  Service: Cardiovascular;  Laterality: N/A;  . VIDEO ASSISTED THORACOSCOPY Left 05/18/2016   Procedure: VIDEO ASSISTED THORACOSCOPY WITH REMOVAL OF LEFT APICAL BLEB;  Surgeon: [name], MD;  Location: [facility];  Service: Thoracic;  Laterality: Left;  Marland Kitchen VIDEO ASSISTED THORACOSCOPY (VATS)/WEDGE RESECTION Left 05/11/2016   Procedure: LEFT VIDEO ASSISTED THORACOSCOPY (VATS);  Surgeon: [name], MD;  Location: [facility];  Service: Thoracic;  Laterality: Left;  Marland Kitchen VIDEO BRONCHOSCOPY N/A 05/11/2016   Procedure: VIDEO BRONCHOSCOPY, LEFT LUNG;  Surgeon: [name], MD;  Location: [facility];  Service: Thoracic;  Laterality: N/A;  . VIDEO BRONCHOSCOPY N/A 05/18/2016   Procedure: VIDEO BRONCHOSCOPY WITH BRONCHIAL WASHING;  Surgeon: [name], MD;  Location: [facility];  Service: Thoracic;  Laterality: N/A;    REVIEW OF SYSTEMS:  A comprehensive review of systems was negative.   PHYSICAL EXAMINATION: General appearance: alert, cooperative and no  distress Head: Normocephalic, without obvious abnormality, atraumatic Neck: no adenopathy, no JVD, supple, symmetrical, trachea midline and thyroid not enlarged, symmetric, no tenderness/mass/nodules Lymph nodes: Cervical, supraclavicular, and axillary nodes normal. Resp: clear to auscultation bilaterally Back: symmetric, no curvature. ROM normal. No CVA tenderness. Cardio: regular rate and rhythm, S1, S2 normal, no murmur, click, rub or gallop GI: soft, non-tender; bowel sounds normal; no masses,  no organomegaly Extremities: extremities normal, atraumatic, no cyanosis or edema   ECOG PERFORMANCE STATUS: 1 - Symptomatic but completely ambulatory  Blood pressure 127/79, pulse 79, temperature 97.8 F (36.6 C), temperature source Tympanic, resp. rate 17, height 6' 3.5" (1.918 m), weight 158 lb 8 oz (71.9 kg), SpO2 98 %.  LABORATORY DATA: Lab Results  Component Value Date   WBC 8.5 03/16/2020   HGB 11.8 (L) 03/16/2020   HCT 35.0 (L) 03/16/2020   MCV 102.6 (H) 03/16/2020   PLT 517 (H) 03/16/2020      Chemistry      Component Value Date/Time   NA 136 03/10/2020 0817   NA 132 (L) 10/05/2019 0830   NA 138 01/11/2017 1343   K 4.3 03/10/2020 0817   K  4.7 01/11/2017 1343   CL 106 02/25/2020 1155   CO2 23 02/25/2020 1155   CO2 23 01/11/2017 1343   BUN 12 02/25/2020 1155   BUN 14 10/05/2019 0830   BUN 20.2 01/11/2017 1343   CREATININE 1.32 (H) 02/25/2020 1155   CREATININE 1.6 (H) 01/11/2017 1343      Component Value Date/Time   CALCIUM 8.4 (L) 02/25/2020 1155   CALCIUM 9.3 01/11/2017 1343   ALKPHOS 81 02/25/2020 1155   ALKPHOS 89 01/11/2017 1343   AST 25 02/25/2020 1155   AST 41 (H) 01/11/2017 1343   ALT 11 02/25/2020 1155   ALT 27 01/11/2017 1343   BILITOT 0.4 02/25/2020 1155   BILITOT 0.49 01/11/2017 1343       RADIOGRAPHIC STUDIES: CARDIAC CATHETERIZATION  Result Date: 03/10/2020 1. Patent coronary arteries with nonobstructive CAD, primarily affecting the mid-RCA.  Widely patent LAD and LCx 2. Normal right heart pressures, including normal PCWP 3. Normal LVEDP Recommend: further review with multidisciplinary heart team, considering transcatheter edge to edge mitral valve repair versus ongoing observation/med rx   ASSESSMENT AND PLAN:  This is a very pleasant 64 years old white male with a stage IB non-small cell lung cancer, adenocarcinoma status post wedge resection of the left upper lobe followed by 4 cycles of adjuvant systemic chemotherapy with cisplatin and Alimta and he tolerated his treatment well except for fatigue. The patient has been on observation since June 2018.   The recent imaging studies including CT scan of the chest as well as a PET scan showed evidence for disease recurrence with metastatic disease presented with bilateral pulmonary nodules as well as destructive bone lesions at T4 vertebral body, biopsy proven to be metastatic adenocarcinoma. Molecular studies by foundation 1 showed no actionable mutations.  PDL 1 expression was negative. The patient is currently undergoing treatment with carboplatin, Alimta and Ketruda (pembrolizumab) status post 38 cycles.  Starting from cycle #5 he is on maintenance treatment with Alimta and Keytruda every 3 weeks. The patient continues to tolerate his treatment well with no concerning adverse effects. I recommended for him to proceed with cycle #39 today as planned. He will come back for follow-up visit in 3 weeks for evaluation before the next cycle of his treatment. He was advised to call immediately if he has any concerning symptoms in the interval. The patient voices understanding of current disease status and treatment options and is in agreement with the current care plan. All questions were answered. The patient knows to call the clinic with any problems, questions or concerns. We can certainly see the patient much sooner if necessary. Disclaimer: This note was dictated with voice recognition  software. Similar sounding words can inadvertently be transcribed and may be missed upon review. Eilleen Kempf, MD 03/16/20

## 2020-03-16 NOTE — Patient Instructions (Signed)
[facility] Discharge Instructions for Patients Receiving Chemotherapy  Today you received the following chemotherapy agents Pembrolizumab (KEYTRUDA) & Pemetrexed (ALIMTA).  To help prevent nausea and vomiting after your treatment, we encourage you to take your nausea medication as prescribed.  If you develop nausea and vomiting that is not controlled by your nausea medication, call the clinic.   BELOW ARE SYMPTOMS THAT SHOULD BE REPORTED IMMEDIATELY:  *FEVER GREATER THAN 100.5 F  *CHILLS WITH OR WITHOUT FEVER  NAUSEA AND VOMITING THAT IS NOT CONTROLLED WITH YOUR NAUSEA MEDICATION  *UNUSUAL SHORTNESS OF BREATH  *UNUSUAL BRUISING OR BLEEDING  TENDERNESS IN MOUTH AND THROAT WITH OR WITHOUT PRESENCE OF ULCERS  *URINARY PROBLEMS  *BOWEL PROBLEMS  UNUSUAL RASH Items with * indicate a potential emergency and should be followed up as soon as possible.  Feel free to call the clinic should you have any questions or concerns. The clinic phone number is [phone].  Please show the [facility] at check-in to the Emergency Department and triage nurse.

## 2020-03-21 ENCOUNTER — Other Ambulatory Visit: Payer: Self-pay

## 2020-03-21 ENCOUNTER — Ambulatory Visit (INDEPENDENT_AMBULATORY_CARE_PROVIDER_SITE_OTHER): Payer: No Typology Code available for payment source | Admitting: Cardiovascular Disease

## 2020-03-21 ENCOUNTER — Encounter: Payer: Self-pay | Admitting: Cardiovascular Disease

## 2020-03-21 VITALS — BP 140/80 | HR 83 | Ht 75.5 in | Wt 157.8 lb

## 2020-03-21 DIAGNOSIS — I340 Nonrheumatic mitral (valve) insufficiency: Secondary | ICD-10-CM | POA: Diagnosis not present

## 2020-03-21 NOTE — Progress Notes (Signed)
Cardiology Office Note:    Date:  03/23/2020   ID:  [name] [name], DOB 1955/12/29, MRN [id number]  PCP:  [name], MD  [facility] Cardiologist:  Sherren Mocha, MD  [place] Electrophysiologist:  None   Referring MD: [name], MD   Chief Complaint  Patient presents with  . Shortness of Breath    History of Present Illness:    [name]. is a 64 y.o. male with a hx of lung adenocarcinoma status post left lower lobectomy, then wedge resection of the left upper lobe.  He has developed recurrent lung cancer with bilateral lung involvement and is on long-term chemotherapy with stable disease.  He was initially seen in September for consideration of transcatheter edge-to-edge mitral valve repair for treatment of severe mitral regurgitation.  The patient has a heart murmur dating back to at least 2017.  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  The patient has undergone multidisciplinary evaluation with formal cardiac surgical consultation by Dr. Roxy Manns.  He was felt to be a poor candidate for cardiac surgery in the setting of his lung cancer.  He underwent right and left heart catheterization as his most recent study.  He and his wife return today for follow-up discussion.  The patient reports no change in symptoms.  He continues to have stable class II shortness of breath with exertion.  He denies orthopnea, PND, or chest pain.  He has mild chronic ankle edema.  The patient was just seen by Dr. Julien Nordmann March 16, 2020.  His most recent imaging studies showed recurrent metastatic disease with bilateral pulmonary nodules and destructive bone lesions at the T4 vertebral body confirmed by biopsy to be metastatic adenocarcinoma.  He continues on chemotherapy for this.  Past Medical History:  Diagnosis Date  . AAA (abdominal aortic aneurysm) ([place])   . AAA (abdominal aortic aneurysm) without rupture ([place]) 02/04/2014  . Cancer ([place])    lung   . Encounter for antineoplastic chemotherapy 06/06/2016  . History of hepatitis B   . HNP (herniated nucleus pulposus), lumbar    L4 with radiculopathy  . Hypertension   . Lung cancer ([place]) 05/18/2016  . Malignant neoplasm of lower lobe of left lung ([place]) 04/05/2016  . Mass of left lung   . Mitral insufficiency 04/06/2016   This patient will eventually need MV repair if his prognosis is good from oncology standpoint. However, his lung cancer therapy  is the priority right now. His cardiac condition won't preclude possible lung surgery.  Once his lung cancer is under control he will need a TEE to further evaluate his mitral valve anatomy and MR severity, and also have ischemic workup as tere is evidence on calcificati  . Renal insufficiency 10/09/2016  . S/P partial lobectomy of lung 05/11/2016  . Varicose veins of legs     Past Surgical History:  Procedure Laterality Date  . ABDOMINAL AORTIC ENDOVASCULAR STENT GRAFT N/A 03/12/2016   Procedure: ABDOMINAL AORTIC ENDOVASCULAR STENT GRAFT;  Surgeon: Conrad Olpe, MD;  Location: [place];  Service: Vascular;  Laterality: N/A;  . COLONOSCOPY    . INGUINAL HERNIA REPAIR Left 1975   Left inguinal hernia  . INGUINAL HERNIA REPAIR Right   . IR IMAGING GUIDED PORT INSERTION  09/04/2019  . LOBECTOMY Left 05/11/2016   Procedure: LEFT LOWER LOBE LOBECTOMY AND LEFT UPPER LOBE  RESECTION AND PLACEMENT OF ON-Q;  Surgeon: [name], MD;  Location: [facility]  OR;  Service: Thoracic;  Laterality: Left;  . LUMBAR LAMINECTOMY  October 22, 2012  . LYMPH NODE DISSECTION Left 05/11/2016   Procedure: LYMPH NODE DISSECTION;  Surgeon: [name], MD;  Location: [place];  Service: Thoracic;  Laterality: Left;  . RIGHT/LEFT HEART CATH AND CORONARY ANGIOGRAPHY N/A 03/10/2020   Procedure: RIGHT/LEFT HEART CATH AND CORONARY ANGIOGRAPHY;  Surgeon: Sherren Mocha, MD;  Location: [place] CV LAB;  Service: Cardiovascular;  Laterality: N/A;  . STAPLING OF BLEBS Left 05/11/2016    Procedure: STAPLING OF APICAL BLEB;  Surgeon: [name], MD;  Location: [place];  Service: Thoracic;  Laterality: Left;  . TEE WITHOUT CARDIOVERSION N/A 10/07/2019   Procedure: TRANSESOPHAGEAL ECHOCARDIOGRAM (TEE);  Surgeon: Dorothy Spark, MD;  Location: [facility] ENDOSCOPY;  Service: Cardiovascular;  Laterality: N/A;  . VIDEO ASSISTED THORACOSCOPY Left 05/18/2016   Procedure: VIDEO ASSISTED THORACOSCOPY WITH REMOVAL OF LEFT APICAL BLEB;  Surgeon: [name], MD;  Location: [place];  Service: Thoracic;  Laterality: Left;  Marland Kitchen VIDEO ASSISTED THORACOSCOPY (VATS)/WEDGE RESECTION Left 05/11/2016   Procedure: LEFT VIDEO ASSISTED THORACOSCOPY (VATS);  Surgeon: [name], MD;  Location: [place];  Service: Thoracic;  Laterality: Left;  Marland Kitchen VIDEO BRONCHOSCOPY N/A 05/11/2016   Procedure: VIDEO BRONCHOSCOPY, LEFT LUNG;  Surgeon: [name], MD;  Location: [place];  Service: Thoracic;  Laterality: N/A;  . VIDEO BRONCHOSCOPY N/A 05/18/2016   Procedure: VIDEO BRONCHOSCOPY WITH BRONCHIAL WASHING;  Surgeon: [name], MD;  Location: [facility] OR;  Service: Thoracic;  Laterality: N/A;    Current Medications: Current Meds  Medication Sig  . carvedilol (COREG) 6.25 MG tablet Take 1 tablet (6.25 mg total) by mouth 2 (two) times daily.  . folic acid (FOLVITE) 1 MG tablet Take 1 tablet (1 mg total) by mouth daily.  Marland Kitchen lisinopril (ZESTRIL) 2.5 MG tablet TAKE 1 TABLET BY MOUTH EVERY DAY     Allergies:   Tramadol   Social History   Socioeconomic History  . Marital status: Married    Spouse name: Not on file  . Number of children: Not on file  . Years of education: Not on file  . Highest education level: Not on file  Occupational History  . Not on file  Tobacco Use  . Smoking status: Former Smoker    Packs/day: 1.00    Types: Cigarettes    Quit date: 03/05/2016    Years since quitting: 4.0  . Smokeless tobacco: Never Used  Vaping Use  . Vaping Use: Never used  Substance and Sexual  Activity  . Alcohol use: Yes    Alcohol/week: 6.0 standard drinks    Types: 6 Cans of beer per week  . 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 Sexual activity: Not on file  Other Topics Concern  . Not on file  Social History Narrative  . Not on file   Social Determinants of Health   Financial Resource Strain:   . Difficulty of Paying Living Expenses: Not on file  Food Insecurity:   . Worried About Charity fundraiser in the Last Year: Not on file  . Ran Out of Food in the Last Year: Not on file  Transportation Needs:   . Lack of Transportation (Medical): Not on file  . Lack of Transportation (Non-Medical): Not on file  Physical Activity:   . Days of Exercise per Week: Not on file  .  Minutes of Exercise per Session: Not on file  Stress:   . Feeling of Stress : Not on file  Social Connections:   . Frequency of Communication with Friends and Family: Not on file  . Frequency of Social Gatherings with Friends and Family: Not on file  . Attends Religious Services: Not on file  . Active Member of Clubs or Organizations: Not on file  . Attends Archivist Meetings: Not on file  . Marital Status: Not on file     Family History: The patient's family history includes Diabetes in his mother.  ROS:   Please see the history of present illness.    All other systems reviewed and are negative.  EKGs/Labs/Other Studies Reviewed:    The following studies were reviewed today: Cardiac Cath: Conclusion  1. Patent coronary arteries with nonobstructive CAD, primarily affecting the mid-RCA. Widely patent LAD and LCx 2. Normal right heart pressures, including normal PCWP 3. Normal LVEDP  Recommend: further review with multidisciplinary heart team, considering transcatheter edge to edge mitral valve repair versus ongoing observation/med rx  Diagnostic Dominance: Co-dominant Left Main  Vessel was injected. Vessel is normal in caliber.  Vessel is angiographically normal. Short left main, no stenosis  Left Anterior Descending  There is mild focal disease in the vessel. Large, wrap-around LAD with no significant stenosis  Mid LAD lesion is 25% stenosed.  Ramus Intermedius  Vessel is moderate in size. Vessel is angiographically normal.  Left Circumflex  The vessel exhibits minimal luminal irregularities.  Right Coronary Artery  Prox RCA to Mid RCA lesion is 50% stenosed. diffuse 50% mid-RCA stenosis  Intervention  No interventions have been documented. Right Heart  Right Heart Pressures LV EDP is normal.  Coronary Diagrams  Diagnostic Dominance: Co-dominant   Fick Cardiac Output 5.02 L/min  Fick Cardiac Output Index 2.55 (L/min)/BSA  Aortic Mean Gradient 3.17 mmHg  Aortic Peak Gradient 0 mmHg  Aortic Valve Area >3.50  Aortic Value Area Index 1.77 cm2/BSA  RA A Wave 6 mmHg  RA V Wave 6 mmHg  RA Mean 2 mmHg  RV Systolic Pressure 28 mmHg  RV Diastolic Pressure 3 mmHg  RV EDP 6 mmHg  PA Systolic Pressure 30 mmHg  PA Diastolic Pressure 6 mmHg  PA Mean 16 mmHg  PW A Wave 7 mmHg  PW V Wave 7 mmHg  PW Mean 6 mmHg  AO Systolic Pressure 016 mmHg  AO Diastolic Pressure 65 mmHg  AO Mean 84 mmHg  LV Systolic Pressure 010 mmHg  LV Diastolic Pressure 1 mmHg  LV EDP 9 mmHg  AOp Systolic Pressure 932 mmHg  AOp Diastolic Pressure 65 mmHg  AOp Mean Pressure 87 mmHg  LVp Systolic Pressure 355 mmHg  LVp Diastolic Pressure 3 mmHg  LVp EDP Pressure 8 mmHg  QP/QS 1  TPVR Index 6.29 HRUI  TSVR Index 33 HRUI  PVR SVR Ratio 0.12  TPVR/TSVR Ratio 0.19   TEE: 1. There is 4+ primary mitral regurgitation with myxomatous anterior  mitral valve leaflet, with prolapse of all three scallops. The jet is  posteriorly directed, eccentric with reversal of pulmonary vein forward  flow bilateraly. PISA radius 1.0 cm, ERO  0.45 cm2, regurgitation volume 58 ml. Posterior leaflet measures 1.0 cm.  Flail gap 0.3 cm.  2. Left  ventricular ejection fraction, by estimation, is 60 to 65%. The  left ventricle has normal function. The left ventricle has no regional  wall motion abnormalities.  3. Right ventricular systolic function is normal.  The right ventricular  size is normal. There is normal pulmonary artery systolic pressure.  4. Left atrial size was moderately dilated. No left atrial/left atrial  appendage thrombus was detected. The LAA emptying velocity was 40 cm/s.  5. Right atrial size was mildly dilated.  6. The mitral valve is myxomatous. No evidence of mitral valve  regurgitation. No evidence of mitral stenosis.  7. Tricuspid valve regurgitation is moderate.  8. The aortic valve is normal in structure. Aortic valve regurgitation is  trivial. No aortic stenosis is present.  9. There is moderate dilatation at the level of the sinuses of Valsalva  measuring 48 mm. There is mild (Grade II) plaque.  10. The inferior vena cava is normal in size with greater than 50%  respiratory variability, suggesting right atrial pressure of 3 mmHg.   Echo 08/18/2019: IMPRESSIONS    1. Left ventricular ejection fraction, by estimation, is 60 to 65%. The  left ventricle has normal function. The left ventricle has no regional  wall motion abnormalities. There is mild left ventricular hypertrophy.  Left ventricular diastolic parameters  are consistent with Grade I diastolic dysfunction (impaired relaxation).  2. Right ventricular systolic function is normal. The right ventricular  size is normal. There is severely elevated pulmonary artery systolic  pressure. The estimated right ventricular systolic pressure is 54.2 mmHg.  3. Left atrial size was moderately dilated.  4. The mitral valve is myxomatous. Severe mitral valve regurgitation.  5. The aortic valve is tricuspid. Aortic valve regurgitation is not  visualized.  6. Aortic dilatation noted. Aneurysm of the ascending aorta, measuring 40  mm. There is  moderate to severe dilatation at the level of the sinuses of  Valsalva measuring 49 mm.  7. The inferior vena cava is normal in size with <50% respiratory  variability, suggesting right atrial pressure of 8 mmHg.   Comparison(s): 06/25/18 EF 60-65%. Severe MR. RVSP 29 mmHg. Sinus of Valsava  measured 3.9 cm on the last study, but now measures 4.9 cm. This  previously measured 4.8 cm in 2019. The ascending aorta is stable.   Recent Labs: 03/16/2020: ALT 14; BUN 13; Creatinine 1.36; Hemoglobin 11.8; Platelet Count 517; Potassium 4.1; Sodium 135; TSH 2.210  Recent Lipid Panel No results found for: CHOL, TRIG, HDL, CHOLHDL, VLDL, LDLCALC, LDLDIRECT   Risk Assessment/Calculations:       Physical Exam:    VS:  BP 140/80   Pulse 83   Ht 6' 3.5" (1.918 m)   Wt 157 lb 12.8 oz (71.6 kg)   SpO2 96%   BMI 19.46 kg/m     Wt Readings from Last 3 Encounters:  03/21/20 157 lb 12.8 oz (71.6 kg)  03/16/20 158 lb 8 oz (71.9 kg)  03/10/20 153 lb (69.4 kg)     GEN:  Well nourished, well developed in no acute distress HEENT: Normal NECK: No JVD; No carotid bruits LYMPHATICS: No lymphadenopathy CARDIAC: RRR, 3/6 holosystolic murmur at the apex RESPIRATORY:  Clear to auscultation without rales, wheezing or rhonchi  ABDOMEN: Soft, non-tender, non-distended MUSCULOSKELETAL: trace bilateral pedal edema; No deformity  SKIN: Warm and dry NEUROLOGIC:  Alert and oriented x 3 PSYCHIATRIC:  Normal affect   ASSESSMENT:    1. Severe mitral regurgitation    PLAN:    In order of problems listed above:  1. The patient and his wife are counseled today regarding treatment options of transcatheter edge-to-edge mitral valve repair versus ongoing medical therapy and observation.  He has stable symptoms with  no significant changes since I have seen him in September.  His cardiac catheterization was notable for the absence of severe obstructive coronary artery disease.  He also was found to have very low  filling pressures including low pulmonary capillary wedge pressure with a V wave of 7 mmHg.  After reviewing the pros and cons of transcatheter mitral valve repair, including potential risks and benefits, we agreed it might be best to continue with surveillance and medical therapy at this time.  The patient has preserved LV function, no evidence of pulmonary hypertension on recent cath with a mean PA pressure of 16 mmHg, and no atrial fibrillation.  He will see Dr. Meda Coffee back in 3 months with an echocardiogram at that time.  We will continue to follow him closely and he understands to report any progressive shortness of breath or change in symptoms.    Shared Decision Making/Informed Consent        Medication Adjustments/Labs and Tests Ordered: Current medicines are reviewed at length with the patient today.  Concerns regarding medicines are outlined above.  No orders of the defined types were placed in this encounter.  No orders of the defined types were placed in this encounter.   There are no Patient Instructions on file for this visit.   Signed, Sherren Mocha, MD  03/23/2020 5:39 AM    [place]

## 2020-03-23 ENCOUNTER — Encounter: Payer: Self-pay | Admitting: Cardiovascular Disease

## 2020-04-05 ENCOUNTER — Ambulatory Visit: Payer: No Typology Code available for payment source | Admitting: Cardiology

## 2020-04-07 ENCOUNTER — Inpatient Hospital Stay: Payer: No Typology Code available for payment source | Attending: Internal Medicine | Admitting: Internal Medicine

## 2020-04-07 ENCOUNTER — Inpatient Hospital Stay: Payer: No Typology Code available for payment source

## 2020-04-07 ENCOUNTER — Encounter: Payer: Self-pay | Admitting: Internal Medicine

## 2020-04-07 ENCOUNTER — Other Ambulatory Visit: Payer: Self-pay

## 2020-04-07 VITALS — BP 123/74 | HR 84 | Temp 97.5°F | Resp 18 | Ht 75.5 in | Wt 158.4 lb

## 2020-04-07 DIAGNOSIS — C3412 Malignant neoplasm of upper lobe, left bronchus or lung: Secondary | ICD-10-CM | POA: Diagnosis present

## 2020-04-07 DIAGNOSIS — Z5111 Encounter for antineoplastic chemotherapy: Secondary | ICD-10-CM

## 2020-04-07 DIAGNOSIS — C3492 Malignant neoplasm of unspecified part of left bronchus or lung: Secondary | ICD-10-CM

## 2020-04-07 DIAGNOSIS — C7951 Secondary malignant neoplasm of bone: Secondary | ICD-10-CM | POA: Insufficient documentation

## 2020-04-07 DIAGNOSIS — C3432 Malignant neoplasm of lower lobe, left bronchus or lung: Secondary | ICD-10-CM

## 2020-04-07 DIAGNOSIS — Z95828 Presence of other vascular implants and grafts: Secondary | ICD-10-CM

## 2020-04-07 DIAGNOSIS — I340 Nonrheumatic mitral (valve) insufficiency: Secondary | ICD-10-CM | POA: Diagnosis not present

## 2020-04-07 DIAGNOSIS — Z5112 Encounter for antineoplastic immunotherapy: Secondary | ICD-10-CM | POA: Insufficient documentation

## 2020-04-07 DIAGNOSIS — C3490 Malignant neoplasm of unspecified part of unspecified bronchus or lung: Secondary | ICD-10-CM | POA: Diagnosis not present

## 2020-04-07 DIAGNOSIS — Z79899 Other long term (current) drug therapy: Secondary | ICD-10-CM | POA: Diagnosis not present

## 2020-04-07 LAB — CMP (CANCER CENTER ONLY)
ALT: 13 U/L (ref 0–44)
AST: 31 U/L (ref 15–41)
Albumin: 3 g/dL — ABNORMAL LOW (ref 3.5–5.0)
Alkaline Phosphatase: 81 U/L (ref 38–126)
Anion gap: 7 (ref 5–15)
BUN: 13 mg/dL (ref 8–23)
CO2: 22 mmol/L (ref 22–32)
Calcium: 8.7 mg/dL — ABNORMAL LOW (ref 8.9–10.3)
Chloride: 106 mmol/L (ref 98–111)
Creatinine: 1.38 mg/dL — ABNORMAL HIGH (ref 0.61–1.24)
GFR, Estimated: 56 mL/min — ABNORMAL LOW (ref >60)
Glucose, Bld: 86 mg/dL (ref 70–99)
Potassium: 4.3 mmol/L (ref 3.5–5.1)
Sodium: 135 mmol/L (ref 135–145)
Total Bilirubin: 0.6 mg/dL (ref 0.3–1.2)
Total Protein: 6.5 g/dL (ref 6.5–8.1)

## 2020-04-07 LAB — CBC WITH DIFFERENTIAL (CANCER CENTER ONLY)
Abs Immature Granulocytes: 0.05 10*3/uL (ref 0.00–0.07)
Basophils Absolute: 0.1 10*3/uL (ref 0.0–0.1)
Basophils Relative: 1 %
Eosinophils Absolute: 0.3 10*3/uL (ref 0.0–0.5)
Eosinophils Relative: 3 %
HCT: 36.4 % — ABNORMAL LOW (ref 39.0–52.0)
Hemoglobin: 11.9 g/dL — ABNORMAL LOW (ref 13.0–17.0)
Immature Granulocytes: 1 %
Lymphocytes Relative: 11 %
Lymphs Abs: 1 10*3/uL (ref 0.7–4.0)
MCH: 34.7 pg — ABNORMAL HIGH (ref 26.0–34.0)
MCHC: 32.7 g/dL (ref 30.0–36.0)
MCV: 106.1 fL — ABNORMAL HIGH (ref 80.0–100.0)
Monocytes Absolute: 1.4 10*3/uL — ABNORMAL HIGH (ref 0.1–1.0)
Monocytes Relative: 15 %
Neutro Abs: 6.4 10*3/uL (ref 1.7–7.7)
Neutrophils Relative %: 69 %
Platelet Count: 463 10*3/uL — ABNORMAL HIGH (ref 150–400)
RBC: 3.43 MIL/uL — ABNORMAL LOW (ref 4.22–5.81)
RDW: 15.7 % — ABNORMAL HIGH (ref 11.5–15.5)
WBC Count: 9.1 10*3/uL (ref 4.0–10.5)
nRBC: 0 % (ref 0.0–0.2)

## 2020-04-07 LAB — TSH: TSH: 1.851 u[IU]/mL (ref 0.320–4.118)

## 2020-04-07 MED ORDER — ONDANSETRON HCL 4 MG/2ML IJ SOLN
INTRAMUSCULAR | Status: AC
  Filled 2020-04-07: qty 4

## 2020-04-07 MED ORDER — SODIUM CHLORIDE 0.9% FLUSH
10.0000 mL | INTRAVENOUS | Status: DC | PRN
  Administered 2020-04-07: 10 mL
  Filled 2020-04-07: qty 10

## 2020-04-07 MED ORDER — CYANOCOBALAMIN 1000 MCG/ML IJ SOLN
1000.0000 ug | Freq: Once | INTRAMUSCULAR | Status: AC
  Administered 2020-04-07: 1000 ug via INTRAMUSCULAR

## 2020-04-07 MED ORDER — HEPARIN SOD (PORK) LOCK FLUSH 100 UNIT/ML IV SOLN
500.0000 [IU] | Freq: Once | INTRAVENOUS | Status: AC | PRN
  Administered 2020-04-07: 500 [IU]
  Filled 2020-04-07: qty 5

## 2020-04-07 MED ORDER — SODIUM CHLORIDE 0.9 % IV SOLN
10.0000 mg | Freq: Once | INTRAVENOUS | Status: AC
  Administered 2020-04-07: 10 mg via INTRAVENOUS
  Filled 2020-04-07: qty 10

## 2020-04-07 MED ORDER — ONDANSETRON HCL 4 MG/2ML IJ SOLN
8.0000 mg | Freq: Once | INTRAMUSCULAR | Status: AC
  Administered 2020-04-07: 8 mg via INTRAVENOUS

## 2020-04-07 MED ORDER — SODIUM CHLORIDE 0.9 % IV SOLN
500.0000 mg/m2 | Freq: Once | INTRAVENOUS | Status: AC
  Administered 2020-04-07: 1000 mg via INTRAVENOUS
  Filled 2020-04-07: qty 40

## 2020-04-07 MED ORDER — CYANOCOBALAMIN 1000 MCG/ML IJ SOLN
INTRAMUSCULAR | Status: AC
  Filled 2020-04-07: qty 1

## 2020-04-07 MED ORDER — SODIUM CHLORIDE 0.9% FLUSH
10.0000 mL | Freq: Once | INTRAVENOUS | Status: AC
  Administered 2020-04-07: 10 mL
  Filled 2020-04-07: qty 10

## 2020-04-07 MED ORDER — SODIUM CHLORIDE 0.9 % IV SOLN
200.0000 mg | Freq: Once | INTRAVENOUS | Status: AC
  Administered 2020-04-07: 200 mg via INTRAVENOUS
  Filled 2020-04-07: qty 8

## 2020-04-07 MED ORDER — SODIUM CHLORIDE 0.9 % IV SOLN
Freq: Once | INTRAVENOUS | Status: AC
  Filled 2020-04-07: qty 250

## 2020-04-07 NOTE — Patient Instructions (Signed)

## 2020-04-07 NOTE — Patient Instructions (Signed)
[facility] Discharge Instructions for Patients Receiving Chemotherapy  Today you received the following chemotherapy agents Pembrolizumab (KEYTRUDA) & Pemetrexed (ALIMTA).  To help prevent nausea and vomiting after your treatment, we encourage you to take your nausea medication as prescribed.  If you develop nausea and vomiting that is not controlled by your nausea medication, call the clinic.   BELOW ARE SYMPTOMS THAT SHOULD BE REPORTED IMMEDIATELY:  *FEVER GREATER THAN 100.5 F  *CHILLS WITH OR WITHOUT FEVER  NAUSEA AND VOMITING THAT IS NOT CONTROLLED WITH YOUR NAUSEA MEDICATION  *UNUSUAL SHORTNESS OF BREATH  *UNUSUAL BRUISING OR BLEEDING  TENDERNESS IN MOUTH AND THROAT WITH OR WITHOUT PRESENCE OF ULCERS  *URINARY PROBLEMS  *BOWEL PROBLEMS  UNUSUAL RASH Items with * indicate a potential emergency and should be followed up as soon as possible.  Feel free to call the clinic should you have any questions or concerns. The clinic phone number is [phone].  Please show the [place] at check-in to the Emergency Department and triage nurse.

## 2020-04-07 NOTE — Progress Notes (Signed)
[place] Telephone:([phone]) [phone]   Fax:(336) [phone]  OFFICE PROGRESS NOTE  [name], MD [address] [place] [address]  DIAGNOSIS: Metastatic non-small cell lung cancer, adenocarcinoma initially diagnosed as stage IB (T2a, N0, M0) non-small cell lung cancer, adenocarcinoma diagnosed in December 2017.  The patient has evidence for disease recurrence in July 2019.  Biomarker Findings Tumor Mutational Burden - TMB-Intermediate (16 Muts/Mb) Microsatellite status - MS-Stable Genomic Findings For a complete list of the genes assayed, please refer to the Appendix. KIT amplification KRAS A35T SMAD4 splice site [id number] KY70 I232F 7 Disease relevant genes with no reportable alterations: EGFR, ALK, BRAF, MET, RET, ERBB2, ROS1   PDL 1 expression is 0%  PRIOR THERAPY:  1) status post left lower lobectomy as well as wedge resection of the left upper lobe on 05/11/2016. 2) Adjuvant systemic chemotherapy with cisplatin 75 MG/M2 and Alimta 500 MG/M2 every 3 weeks. First dose 07/03/2016. Status post 4 cycles.  CURRENT THERAPY: Systemic chemotherapy with carboplatin for AUC of 5, Alimta 500 mg/M2 and Keytruda 200 mg IV every 3 weeks.  Status post 39 cycles.  Starting from cycle #5 the patient will be treated with maintenance Alimta and Ketruda (pembrolizumab) every 3 weeks.  INTERVAL HISTORY: William Kales [name] 64 y.o. male returns to the clinic today for follow-up visit.  The patient has no complaints today.  He underwent cardiac catheterization for evaluation of the mitral valvular regurgitation under the care of Dr. Harold Hedge and he is currently managed medically.  He denied having any chest pain, shortness of breath, cough or hemoptysis.  He continues to have mild swelling of the lower extremities.  The patient denied having any fever or chills.  He has no nausea, vomiting, diarrhea or constipation.  He has no headache or visual changes.  He continues to  tolerate his treatment with systemic chemotherapy fairly well.  He is here today for evaluation before starting cycle #40.  MEDICAL HISTORY: Past Medical History:  Diagnosis Date   AAA (abdominal aortic aneurysm) ([place])    AAA (abdominal aortic aneurysm) without rupture ([place]) 02/04/2014   Cancer ([place])    lung   Encounter for antineoplastic chemotherapy 06/06/2016   History of hepatitis B    HNP (herniated nucleus pulposus), lumbar    L4 with radiculopathy   Hypertension    Lung cancer ([place]) 05/18/2016   Malignant neoplasm of lower lobe of left lung ([place]) 04/05/2016   Mass of left lung    Mitral insufficiency 04/06/2016   This patient will eventually need MV repair if his prognosis is good from oncology standpoint. However, his lung cancer therapy  is the priority right now. His cardiac condition won't preclude possible lung surgery.  Once his lung cancer is under control he will need a TEE to further evaluate his mitral valve anatomy and MR severity, and also have ischemic workup as tere is evidence on calcificati   Renal insufficiency 10/09/2016   S/P partial lobectomy of lung 05/11/2016   Varicose veins of legs     ALLERGIES:  is allergic to tramadol.  MEDICATIONS:  Current Outpatient Medications  Medication Sig Dispense Refill   carvedilol (COREG) 6.25 MG tablet Take 1 tablet (6.25 mg total) by mouth 2 (two) times daily. 623 tablet 3   folic acid (FOLVITE) 1 MG tablet Take 1 tablet (1 mg total) by mouth daily. 90 tablet 1   lisinopril (ZESTRIL) 2.5 MG tablet TAKE  1 TABLET BY MOUTH EVERY DAY 90 tablet 3   No current facility-administered medications for this visit.    SURGICAL HISTORY:  Past Surgical History:  Procedure Laterality Date   ABDOMINAL AORTIC ENDOVASCULAR STENT GRAFT N/A 03/12/2016   Procedure: ABDOMINAL AORTIC ENDOVASCULAR STENT GRAFT;  Surgeon: Conrad Elnora, MD;  Location: [facility];  Service: Vascular;  Laterality: N/A;   COLONOSCOPY     INGUINAL  HERNIA REPAIR Left 1975   Left inguinal hernia   INGUINAL HERNIA REPAIR Right    IR IMAGING GUIDED PORT INSERTION  09/04/2019   LOBECTOMY Left 05/11/2016   Procedure: LEFT LOWER LOBE LOBECTOMY AND LEFT UPPER LOBE  RESECTION AND PLACEMENT OF ON-Q;  Surgeon: [name], MD;  Location: [facility];  Service: Thoracic;  Laterality: Left;   LUMBAR LAMINECTOMY  October 22, 2012   LYMPH NODE DISSECTION Left 05/11/2016   Procedure: LYMPH NODE DISSECTION;  Surgeon: [name], MD;  Location: [facility];  Service: Thoracic;  Laterality: Left;   RIGHT/LEFT HEART CATH AND CORONARY ANGIOGRAPHY N/A 03/10/2020   Procedure: RIGHT/LEFT HEART CATH AND CORONARY ANGIOGRAPHY;  Surgeon: Sherren Mocha, MD;  Location: [facility];  Service: Cardiovascular;  Laterality: N/A;   STAPLING OF BLEBS Left 05/11/2016   Procedure: STAPLING OF APICAL BLEB;  Surgeon: [name], MD;  Location: [facility];  Service: Thoracic;  Laterality: Left;   TEE WITHOUT CARDIOVERSION N/A 10/07/2019   Procedure: TRANSESOPHAGEAL ECHOCARDIOGRAM (TEE);  Surgeon: Dorothy Spark, MD;  Location: [facility];  Service: Cardiovascular;  Laterality: N/A;   VIDEO ASSISTED THORACOSCOPY Left 05/18/2016   Procedure: VIDEO ASSISTED THORACOSCOPY WITH REMOVAL OF LEFT APICAL BLEB;  Surgeon: [name], MD;  Location: [facility];  Service: Thoracic;  Laterality: Left;   VIDEO ASSISTED THORACOSCOPY (VATS)/WEDGE RESECTION Left 05/11/2016   Procedure: LEFT VIDEO ASSISTED THORACOSCOPY (VATS);  Surgeon: [name], MD;  Location: [facility];  Service: Thoracic;  Laterality: Left;   VIDEO BRONCHOSCOPY N/A 05/11/2016   Procedure: VIDEO BRONCHOSCOPY, LEFT LUNG;  Surgeon: [name], MD;  Location: [facility];  Service: Thoracic;  Laterality: N/A;   VIDEO BRONCHOSCOPY N/A 05/18/2016   Procedure: VIDEO BRONCHOSCOPY WITH BRONCHIAL WASHING;  Surgeon: [name], MD;  Location: [facility];  Service: Thoracic;  Laterality: N/A;    REVIEW OF  SYSTEMS:  A comprehensive review of systems was negative.   PHYSICAL EXAMINATION: General appearance: alert, cooperative and no distress Head: Normocephalic, without obvious abnormality, atraumatic Neck: no adenopathy, no JVD, supple, symmetrical, trachea midline and thyroid not enlarged, symmetric, no tenderness/mass/nodules Lymph nodes: Cervical, supraclavicular, and axillary nodes normal. Resp: clear to auscultation bilaterally Back: symmetric, no curvature. ROM normal. No CVA tenderness. Cardio: regular rate and rhythm, S1, S2 normal, no murmur, click, rub or gallop GI: soft, non-tender; bowel sounds normal; no masses,  no organomegaly Extremities: edema 1+ edema bilateral   ECOG PERFORMANCE STATUS: 1 - Symptomatic but completely ambulatory  Blood pressure 123/74, pulse 84, temperature (!) 97.5 F (36.4 C), temperature source Tympanic, resp. rate 18, height 6' 3.5" (1.918 m), weight 158 lb 6.4 oz (71.8 kg), SpO2 93 %.  LABORATORY DATA: Lab Results  Component Value Date   WBC 9.1 04/07/2020   HGB 11.9 (L) 04/07/2020   HCT 36.4 (L) 04/07/2020   MCV 106.1 (H) 04/07/2020   PLT 463 (H) 04/07/2020      Chemistry      Component Value Date/Time   NA 135 03/16/2020 1120   NA  132 (L) 10/05/2019 0830   NA 138 01/11/2017 1343   K 4.1 03/16/2020 1120   K 4.7 01/11/2017 1343   CL 108 03/16/2020 1120   CO2 20 (L) 03/16/2020 1120   CO2 23 01/11/2017 1343   BUN 13 03/16/2020 1120   BUN 14 10/05/2019 0830   BUN 20.2 01/11/2017 1343   CREATININE 1.36 (H) 03/16/2020 1120   CREATININE 1.6 (H) 01/11/2017 1343      Component Value Date/Time   CALCIUM 8.6 (L) 03/16/2020 1120   CALCIUM 9.3 01/11/2017 1343   ALKPHOS 78 03/16/2020 1120   ALKPHOS 89 01/11/2017 1343   AST 32 03/16/2020 1120   AST 41 (H) 01/11/2017 1343   ALT 14 03/16/2020 1120   ALT 27 01/11/2017 1343   BILITOT 0.4 03/16/2020 1120   BILITOT 0.49 01/11/2017 1343       RADIOGRAPHIC STUDIES: CARDIAC  CATHETERIZATION  Result Date: 03/10/2020 1. Patent coronary arteries with nonobstructive CAD, primarily affecting the mid-RCA. Widely patent LAD and LCx 2. Normal right heart pressures, including normal PCWP 3. Normal LVEDP Recommend: further review with multidisciplinary heart team, considering transcatheter edge to edge mitral valve repair versus ongoing observation/med rx   ASSESSMENT AND PLAN:  This is a very pleasant 64 years old white male with a stage IB non-small cell lung cancer, adenocarcinoma status post wedge resection of the left upper lobe followed by 4 cycles of adjuvant systemic chemotherapy with cisplatin and Alimta and he tolerated his treatment well except for fatigue. The patient has been on observation since June 2018.   The recent imaging studies including CT scan of the chest as well as a PET scan showed evidence for disease recurrence with metastatic disease presented with bilateral pulmonary nodules as well as destructive bone lesions at T4 vertebral body, biopsy proven to be metastatic adenocarcinoma. Molecular studies by foundation 1 showed no actionable mutations.  PDL 1 expression was negative. The patient is currently undergoing treatment with carboplatin, Alimta and Ketruda (pembrolizumab) status post 39 cycles.  Starting from cycle #5 he is on maintenance treatment with Alimta and Keytruda every 3 weeks. He has been tolerating this treatment well with no concerning complaints. I recommended for him to proceed with cycle #4 today as planned. I will arrange for the patient to have repeat CT scan of the chest, abdomen and pelvis before his upcoming visit in 3 weeks. He was advised to call immediately if he has any concerning symptoms in the interval. The patient voices understanding of current disease status and treatment options and is in agreement with the current care plan. All questions were answered. The patient knows to call the clinic with any problems, questions  or concerns. We can certainly see the patient much sooner if necessary. Disclaimer: This note was dictated with voice recognition software. Similar sounding words can inadvertently be transcribed and may be missed upon review. Eilleen Kempf, MD 04/07/20

## 2020-04-18 ENCOUNTER — Telehealth: Payer: Self-pay | Admitting: Medical Oncology

## 2020-04-18 NOTE — Telephone Encounter (Signed)
Bilateral pain in both calves as soon as he stands up and puts wt on his feet.oth ankles swell in am and when  he puts his legs up the swelling eventually disappears.

## 2020-04-18 NOTE — Telephone Encounter (Signed)
Pt.notified

## 2020-04-18 NOTE — Telephone Encounter (Signed)
Monitor for now.  He can use Tylenol and occasional ibuprofen if needed.  Thank you.

## 2020-04-26 ENCOUNTER — Ambulatory Visit (HOSPITAL_COMMUNITY)
Admission: RE | Admit: 2020-04-26 | Discharge: 2020-04-26 | Disposition: A | Discharge status: Home / Self Care | Payer: Federal, State, Local not specified - PPO | Source: Ambulatory Visit | Attending: Internal Medicine | Admitting: Internal Medicine

## 2020-04-26 ENCOUNTER — Other Ambulatory Visit: Payer: Self-pay

## 2020-04-26 DIAGNOSIS — C3490 Malignant neoplasm of unspecified part of unspecified bronchus or lung: Secondary | ICD-10-CM | POA: Diagnosis not present

## 2020-04-26 MED ORDER — HEPARIN SOD (PORK) LOCK FLUSH 100 UNIT/ML IV SOLN
INTRAVENOUS | Status: AC
  Filled 2020-04-26: qty 5

## 2020-04-26 MED ORDER — IOHEXOL 300 MG/ML  SOLN
100.0000 mL | Freq: Once | INTRAMUSCULAR | Status: AC | PRN
  Administered 2020-04-26: 100 mL via INTRAVENOUS

## 2020-04-27 MED FILL — Dexamethasone Sodium Phosphate Inj 100 MG/10ML: INTRAMUSCULAR | Qty: 1 | Status: AC

## 2020-04-28 ENCOUNTER — Inpatient Hospital Stay: Payer: Federal, State, Local not specified - PPO

## 2020-04-28 ENCOUNTER — Inpatient Hospital Stay: Payer: Federal, State, Local not specified - PPO | Attending: Internal Medicine | Admitting: Internal Medicine

## 2020-04-28 ENCOUNTER — Other Ambulatory Visit: Payer: Self-pay

## 2020-04-28 VITALS — BP 112/64 | HR 76 | Temp 97.5°F | Resp 18 | Ht 75.5 in | Wt 147.8 lb

## 2020-04-28 DIAGNOSIS — C3432 Malignant neoplasm of lower lobe, left bronchus or lung: Secondary | ICD-10-CM

## 2020-04-28 DIAGNOSIS — Z95828 Presence of other vascular implants and grafts: Secondary | ICD-10-CM

## 2020-04-28 DIAGNOSIS — C3412 Malignant neoplasm of upper lobe, left bronchus or lung: Secondary | ICD-10-CM | POA: Diagnosis not present

## 2020-04-28 DIAGNOSIS — Z5111 Encounter for antineoplastic chemotherapy: Secondary | ICD-10-CM | POA: Diagnosis not present

## 2020-04-28 DIAGNOSIS — C3492 Malignant neoplasm of unspecified part of left bronchus or lung: Secondary | ICD-10-CM | POA: Diagnosis not present

## 2020-04-28 DIAGNOSIS — C3490 Malignant neoplasm of unspecified part of unspecified bronchus or lung: Secondary | ICD-10-CM

## 2020-04-28 DIAGNOSIS — Z79899 Other long term (current) drug therapy: Secondary | ICD-10-CM | POA: Insufficient documentation

## 2020-04-28 DIAGNOSIS — Z5112 Encounter for antineoplastic immunotherapy: Secondary | ICD-10-CM | POA: Insufficient documentation

## 2020-04-28 LAB — CMP (CANCER CENTER ONLY)
ALT: 9 U/L (ref 0–44)
AST: 23 U/L (ref 15–41)
Albumin: 3 g/dL — ABNORMAL LOW (ref 3.5–5.0)
Alkaline Phosphatase: 74 U/L (ref 38–126)
Anion gap: 9 (ref 5–15)
BUN: 20 mg/dL (ref 8–23)
CO2: 20 mmol/L — ABNORMAL LOW (ref 22–32)
Calcium: 8.5 mg/dL — ABNORMAL LOW (ref 8.9–10.3)
Chloride: 103 mmol/L (ref 98–111)
Creatinine: 1.82 mg/dL — ABNORMAL HIGH (ref 0.61–1.24)
GFR, Estimated: 40 mL/min — ABNORMAL LOW (ref >60)
Glucose, Bld: 97 mg/dL (ref 70–99)
Potassium: 3.7 mmol/L (ref 3.5–5.1)
Sodium: 132 mmol/L — ABNORMAL LOW (ref 135–145)
Total Bilirubin: 0.5 mg/dL (ref 0.3–1.2)
Total Protein: 6.5 g/dL (ref 6.5–8.1)

## 2020-04-28 LAB — CBC WITH DIFFERENTIAL (CANCER CENTER ONLY)
Abs Immature Granulocytes: 0.05 10*3/uL (ref 0.00–0.07)
Basophils Absolute: 0 10*3/uL (ref 0.0–0.1)
Basophils Relative: 0 %
Eosinophils Absolute: 0.3 10*3/uL (ref 0.0–0.5)
Eosinophils Relative: 3 %
HCT: 32.9 % — ABNORMAL LOW (ref 39.0–52.0)
Hemoglobin: 10.8 g/dL — ABNORMAL LOW (ref 13.0–17.0)
Immature Granulocytes: 1 %
Lymphocytes Relative: 8 %
Lymphs Abs: 0.7 10*3/uL (ref 0.7–4.0)
MCH: 34.6 pg — ABNORMAL HIGH (ref 26.0–34.0)
MCHC: 32.8 g/dL (ref 30.0–36.0)
MCV: 105.4 fL — ABNORMAL HIGH (ref 80.0–100.0)
Monocytes Absolute: 1.3 10*3/uL — ABNORMAL HIGH (ref 0.1–1.0)
Monocytes Relative: 14 %
Neutro Abs: 6.7 10*3/uL (ref 1.7–7.7)
Neutrophils Relative %: 74 %
Platelet Count: 484 10*3/uL — ABNORMAL HIGH (ref 150–400)
RBC: 3.12 MIL/uL — ABNORMAL LOW (ref 4.22–5.81)
RDW: 15.9 % — ABNORMAL HIGH (ref 11.5–15.5)
WBC Count: 9 10*3/uL (ref 4.0–10.5)
nRBC: 0 % (ref 0.0–0.2)

## 2020-04-28 LAB — TSH: TSH: 2.298 u[IU]/mL (ref 0.320–4.118)

## 2020-04-28 MED ORDER — SODIUM CHLORIDE 0.9 % IV SOLN
200.0000 mg | Freq: Once | INTRAVENOUS | Status: AC
  Administered 2020-04-28: 200 mg via INTRAVENOUS
  Filled 2020-04-28: qty 8

## 2020-04-28 MED ORDER — ONDANSETRON HCL 4 MG/2ML IJ SOLN
INTRAMUSCULAR | Status: AC
  Filled 2020-04-28: qty 4

## 2020-04-28 MED ORDER — SODIUM CHLORIDE 0.9 % IV SOLN
Freq: Once | INTRAVENOUS | Status: DC
  Filled 2020-04-28: qty 250

## 2020-04-28 MED ORDER — SODIUM CHLORIDE 0.9% FLUSH
10.0000 mL | INTRAVENOUS | Status: DC | PRN
  Administered 2020-04-28: 10 mL
  Filled 2020-04-28: qty 10

## 2020-04-28 MED ORDER — SODIUM CHLORIDE 0.9% FLUSH
10.0000 mL | Freq: Once | INTRAVENOUS | Status: AC
  Administered 2020-04-28: 10 mL
  Filled 2020-04-28: qty 10

## 2020-04-28 MED ORDER — HEPARIN SOD (PORK) LOCK FLUSH 100 UNIT/ML IV SOLN
500.0000 [IU] | Freq: Once | INTRAVENOUS | Status: AC | PRN
  Administered 2020-04-28: 500 [IU]
  Filled 2020-04-28: qty 5

## 2020-04-28 NOTE — Progress Notes (Signed)
Due to rise in [facility], will hold alimta today per Dr Julien Nordmann.  Keytruda only.

## 2020-04-28 NOTE — Patient Instructions (Addendum)
[facility] Discharge Instructions for Patients Receiving Chemotherapy  Today you received the following chemotherapy agents Pembrolizumab [facility])   To help prevent nausea and vomiting after your treatment, we encourage you to take your nausea medication as prescribed.  If you develop nausea and vomiting that is not controlled by your nausea medication, call the clinic.   BELOW ARE SYMPTOMS THAT SHOULD BE REPORTED IMMEDIATELY:  *FEVER GREATER THAN 100.5 F  *CHILLS WITH OR WITHOUT FEVER  NAUSEA AND VOMITING THAT IS NOT CONTROLLED WITH YOUR NAUSEA MEDICATION  *UNUSUAL SHORTNESS OF BREATH  *UNUSUAL BRUISING OR BLEEDING  TENDERNESS IN MOUTH AND THROAT WITH OR WITHOUT PRESENCE OF ULCERS  *URINARY PROBLEMS  *BOWEL PROBLEMS  UNUSUAL RASH Items with * indicate a potential emergency and should be followed up as soon as possible.  Feel free to call the clinic should you have any questions or concerns. The clinic phone number is [phone].  Please show the [facility] at check-in to the Emergency Department and triage nurse.

## 2020-04-28 NOTE — Patient Instructions (Signed)

## 2020-04-28 NOTE — Progress Notes (Signed)
[facility] [phone]) [phone]   [phone]) [phone]  OFFICE PROGRESS NOTE  [name], MD [address] [place] [address]  DIAGNOSIS: Metastatic non-small cell lung cancer, adenocarcinoma initially diagnosed as stage IB (T2a, N0, M0) non-small cell lung cancer, adenocarcinoma diagnosed in December 2017.  The patient has evidence for disease recurrence in July 2019.  Biomarker Findings Tumor Mutational Burden - TMB-Intermediate (16 Muts/Mb) Microsatellite status - MS-Stable Genomic Findings For a complete list of the genes assayed, please refer to the Appendix. KIT amplification KRAS S17B SMAD4 splice site 9390-3E>S PQ33 I232F 7 Disease relevant genes with no reportable alterations: EGFR, ALK, BRAF, MET, RET, ERBB2, ROS1   PDL 1 expression is 0%  PRIOR THERAPY:  1) status post left lower lobectomy as well as wedge resection of the left upper lobe on 05/11/2016. 2) Adjuvant systemic chemotherapy with cisplatin 75 MG/M2 and Alimta 500 MG/M2 every 3 weeks. First dose 07/03/2016. Status post 4 cycles.  CURRENT THERAPY: Systemic chemotherapy with carboplatin for AUC of 5, Alimta 500 mg/M2 and Keytruda 200 mg IV every 3 weeks.  Status post 40 cycles.  Starting from cycle #5 the patient will be treated with maintenance Alimta and Ketruda (pembrolizumab) every 3 weeks.  INTERVAL HISTORY: William Kales [name] 65 y.o. male returns to the clinic today for follow-up visit.  The patient is feeling fine today with no concerning complaints except for mild swelling of the lower extremities.  He denied having any current chest pain, shortness of breath, cough or hemoptysis.  He denied having any fever or chills.  He has no nausea, vomiting, diarrhea or constipation.  He denied having any headache or visual changes.  The patient denied having any weight loss or night sweats.  He continues to tolerate his treatment with maintenance Alimta and Keytruda fairly well.   He had repeat CT scan of the chest, abdomen pelvis performed recently and he is here for evaluation and discussion of his scan results.  MEDICAL HISTORY: Past Medical History:  Diagnosis Date  . AAA (abdominal aortic aneurysm) ([facility] [place])   . AAA (abdominal aortic aneurysm) without rupture ([facility]) 02/04/2014  . Cancer ([facility])    lung  . Encounter for antineoplastic chemotherapy 06/06/2016  . History of hepatitis B   . HNP (herniated nucleus pulposus), lumbar    L4 with radiculopathy  . Hypertension   . Lung cancer ([facility]) 05/18/2016  . Malignant neoplasm of lower lobe of left lung ([facility]) 04/05/2016  . Mass of left lung   . Mitral insufficiency 04/06/2016   This patient will eventually need MV repair if his prognosis is good from oncology standpoint. However, his lung cancer therapy  is the priority right now. His cardiac condition won't preclude possible lung surgery.  Once his lung cancer is under control he will need a TEE to further evaluate his mitral valve anatomy and MR severity, and also have ischemic workup as tere is evidence on calcificati  . Renal insufficiency 10/09/2016  . S/P partial lobectomy of lung 05/11/2016  . Varicose veins of legs     ALLERGIES:  is allergic to tramadol.  MEDICATIONS:  Current Outpatient Medications  Medication Sig Dispense Refill  . carvedilol (COREG) 6.25 MG tablet Take 1 tablet (6.25 mg total) by mouth 2 (two) times daily. 007 tablet 3  . folic acid (FOLVITE) 1 MG tablet Take 1 tablet (1 mg total) by mouth daily. 90 tablet 1  . lisinopril (  ZESTRIL) 2.5 MG tablet TAKE 1 TABLET BY MOUTH EVERY DAY 90 tablet 3   No current facility-administered medications for this visit.    SURGICAL HISTORY:  Past Surgical History:  Procedure Laterality Date  . ABDOMINAL AORTIC ENDOVASCULAR STENT GRAFT N/A 03/12/2016   Procedure: ABDOMINAL AORTIC ENDOVASCULAR STENT GRAFT;  Surgeon: Conrad Montrose, MD;  Location: [facility];  Service: Vascular;  Laterality: N/A;  . COLONOSCOPY     . INGUINAL HERNIA REPAIR Left 1975   Left inguinal hernia  . INGUINAL HERNIA REPAIR Right   . IR IMAGING GUIDED PORT INSERTION  09/04/2019  . LOBECTOMY Left 05/11/2016   Procedure: LEFT LOWER LOBE LOBECTOMY AND LEFT UPPER LOBE  RESECTION AND PLACEMENT OF ON-Q;  Surgeon: [name], MD;  Location: [facility];  Service: Thoracic;  Laterality: Left;  . LUMBAR LAMINECTOMY  October 22, 2012  . LYMPH NODE DISSECTION Left 05/11/2016   Procedure: LYMPH NODE DISSECTION;  Surgeon: [name], MD;  Location: [facility];  Service: Thoracic;  Laterality: Left;  . RIGHT/LEFT HEART CATH AND CORONARY ANGIOGRAPHY N/A 03/10/2020   Procedure: RIGHT/LEFT HEART CATH AND CORONARY ANGIOGRAPHY;  Surgeon: Sherren Mocha, MD;  Location: [facility];  Service: Cardiovascular;  Laterality: N/A;  . STAPLING OF BLEBS Left 05/11/2016   Procedure: STAPLING OF APICAL BLEB;  Surgeon: [name], MD;  Location: [facility];  Service: Thoracic;  Laterality: Left;  . TEE WITHOUT CARDIOVERSION N/A 10/07/2019   Procedure: TRANSESOPHAGEAL ECHOCARDIOGRAM (TEE);  Surgeon: Dorothy Spark, MD;  [facility] ENDOSCOPY;  Service: Cardiovascular;  Laterality: N/A;  . VIDEO ASSISTED THORACOSCOPY Left 05/18/2016   Procedure: VIDEO ASSISTED THORACOSCOPY WITH REMOVAL OF LEFT APICAL BLEB;  Surgeon: [name], MD;  Location: [facility];  Service: Thoracic;  Laterality: Left;  Marland Kitchen VIDEO ASSISTED THORACOSCOPY (VATS)/WEDGE RESECTION Left 05/11/2016   Procedure: LEFT VIDEO ASSISTED THORACOSCOPY (VATS);  Surgeon: [name], MD;  Location: [facility];  Service: Thoracic;  Laterality: Left;  Marland Kitchen VIDEO BRONCHOSCOPY N/A 05/11/2016   Procedure: VIDEO BRONCHOSCOPY, LEFT LUNG;  Surgeon: [name], MD;  Location: [facility];  Service: Thoracic;  Laterality: N/A;  . VIDEO BRONCHOSCOPY N/A 05/18/2016   Procedure: VIDEO BRONCHOSCOPY WITH BRONCHIAL WASHING;  Surgeon: [name], MD;  Location: [facility];  Service: Thoracic;  Laterality: N/A;     REVIEW OF SYSTEMS:  Constitutional: positive for fatigue Eyes: negative Ears, nose, mouth, throat, and face: negative Respiratory: negative Cardiovascular: negative Gastrointestinal: negative Genitourinary:negative Integument/breast: negative Hematologic/lymphatic: negative Musculoskeletal:negative Neurological: negative Behavioral/Psych: negative Endocrine: negative Allergic/Immunologic: negative   PHYSICAL EXAMINATION: General appearance: alert, cooperative and no distress Head: Normocephalic, without obvious abnormality, atraumatic Neck: no adenopathy, no JVD, supple, symmetrical, trachea midline and thyroid not enlarged, symmetric, no tenderness/mass/nodules Lymph nodes: Cervical, supraclavicular, and axillary nodes normal. Resp: clear to auscultation bilaterally Back: symmetric, no curvature. ROM normal. No CVA tenderness. Cardio: regular rate and rhythm, S1, S2 normal, no murmur, click, rub or gallop GI: soft, non-tender; bowel sounds normal; no masses,  no organomegaly Extremities: edema 1+ edema bilateral Neurologic: Alert and oriented X 3, normal strength and tone. Normal symmetric reflexes. Normal coordination and gait   ECOG PERFORMANCE STATUS: 1 - Symptomatic but completely ambulatory  Blood pressure 112/64, pulse 76, temperature (!) 97.5 F (36.4 C), temperature source Tympanic, resp. rate 18, height 6' 3.5" (1.918 m), weight 147 lb 12.8 oz (67 kg), SpO2 98 %.  LABORATORY DATA: Lab Results  Component Value Date   WBC 9.1  04/07/2020   HGB 11.9 (L) 04/07/2020   HCT 36.4 (L) 04/07/2020   MCV 106.1 (H) 04/07/2020   PLT 463 (H) 04/07/2020      Chemistry      Component Value Date/Time   NA 135 04/07/2020 1206   NA 132 (L) 10/05/2019 0830   NA 138 01/11/2017 1343   K 4.3 04/07/2020 1206   K 4.7 01/11/2017 1343   CL 106 04/07/2020 1206   CO2 22 04/07/2020 1206   CO2 23 01/11/2017 1343   BUN 13 04/07/2020 1206   BUN 14 10/05/2019 0830   BUN 20.2  01/11/2017 1343   CREATININE 1.38 (H) 04/07/2020 1206   CREATININE 1.6 (H) 01/11/2017 1343      Component Value Date/Time   CALCIUM 8.7 (L) 04/07/2020 1206   CALCIUM 9.3 01/11/2017 1343   ALKPHOS 81 04/07/2020 1206   ALKPHOS 89 01/11/2017 1343   AST 31 04/07/2020 1206   AST 41 (H) 01/11/2017 1343   ALT 13 04/07/2020 1206   ALT 27 01/11/2017 1343   BILITOT 0.6 04/07/2020 1206   BILITOT 0.49 01/11/2017 1343       RADIOGRAPHIC STUDIES: CT Chest W Contrast  Result Date: 04/26/2020 CLINICAL DATA:  65 year old male with history of non-small cell lung cancer currently undergoing chemotherapy. Follow-up study. EXAM: CT CHEST, ABDOMEN, AND PELVIS WITH CONTRAST TECHNIQUE: Multidetector CT imaging of the chest, abdomen and pelvis was performed following the standard protocol during bolus administration of intravenous contrast. CONTRAST:  120m OMNIPAQUE IOHEXOL 300 MG/ML  SOLN COMPARISON:  CT the chest, abdomen and pelvis 01/15/2020. FINDINGS: CT CHEST FINDINGS Cardiovascular: Heart size is normal. No significant pericardial fluid, thickening or calcification. There is aortic atherosclerosis, as well as atherosclerosis of the great vessels of the mediastinum and the coronary arteries, including calcified atherosclerotic plaque in the left main coronary artery. Right internal jugular single-lumen porta cath with tip terminating in the distal superior vena cava. Mediastinum/Nodes: No pathologically enlarged mediastinal or hilar lymph nodes. Esophagus is unremarkable in appearance. No axillary lymphadenopathy. Lungs/Pleura: Status post left lower lobectomy. Trace left pleural effusion. Trace amount of right pleural fluid and thickening with extensive right pleural calcification. No left pleural calcification. Diffuse bronchial wall thickening with moderate centrilobular and paraseptal emphysema no acute consolidative airspace disease. No definite suspicious appearing pulmonary nodules or masses are noted.  Musculoskeletal: There are no aggressive appearing lytic or blastic lesions noted in the visualized portions of the skeleton. CT ABDOMEN PELVIS FINDINGS Hepatobiliary: Tiny subcentimeter low-attenuation lesions in the liver, too small to definitively characterize, but statistically likely to represent tiny cysts. No other larger more suspicious appearing hepatic lesions. No intra or extrahepatic biliary ductal dilatation. Gallbladder is normal in appearance. Pancreas: No pancreatic mass. No pancreatic ductal dilatation. No pancreatic or peripancreatic fluid collections or inflammatory changes. Spleen: Unremarkable. Adrenals/Urinary Tract: A few subcentimeter low-attenuation lesions are noted in both kidneys, too small to characterize, but statistically likely to represent tiny cysts. Bilateral adrenal glands are normal in appearance. No hydroureteronephrosis. Urinary bladder is normal in appearance. Stomach/Bowel: Normal appearance of the stomach. No pathologic dilatation of small bowel or colon. Normal appendix. Vascular/Lymphatic: Aortic atherosclerosis with postprocedural changes of aorto bi-iliac stent graft, which appears patent bilaterally. No lymphadenopathy noted in the abdomen or pelvis. Reproductive: Prostate gland and seminal vesicles are unremarkable in appearance. Other: No significant volume of ascites.  No pneumoperitoneum. Musculoskeletal: There are no aggressive appearing lytic or blastic lesions noted in the visualized portions of the skeleton. IMPRESSION: 1. Status  post left lower lobectomy. No findings to suggest local recurrence of disease or definite metastatic disease in the chest, abdomen or pelvis. 2. Trace bilateral pleural effusions, along with some chronic right-sided pleural thickening calcification, stable compared to prior examinations. 3. Diffuse bronchial wall thickening with moderate centrilobular and paraseptal emphysema; imaging findings suggestive of underlying COPD. 4. Aortic  atherosclerosis, in addition to left main coronary artery disease. Status post aorto bi-iliac stent graft. 5. Additional incidental findings, as above. Electronically Signed   By: Vinnie Langton M.D.   On: 04/26/2020 09:49   CT Abdomen Pelvis W Contrast  Result Date: 04/26/2020 CLINICAL DATA:  65 year old male with history of non-small cell lung cancer currently undergoing chemotherapy. Follow-up study. EXAM: CT CHEST, ABDOMEN, AND PELVIS WITH CONTRAST TECHNIQUE: Multidetector CT imaging of the chest, abdomen and pelvis was performed following the standard protocol during bolus administration of intravenous contrast. CONTRAST:  141m OMNIPAQUE IOHEXOL 300 MG/ML  SOLN COMPARISON:  CT the chest, abdomen and pelvis 01/15/2020. FINDINGS: CT CHEST FINDINGS Cardiovascular: Heart size is normal. No significant pericardial fluid, thickening or calcification. There is aortic atherosclerosis, as well as atherosclerosis of the great vessels of the mediastinum and the coronary arteries, including calcified atherosclerotic plaque in the left main coronary artery. Right internal jugular single-lumen porta cath with tip terminating in the distal superior vena cava. Mediastinum/Nodes: No pathologically enlarged mediastinal or hilar lymph nodes. Esophagus is unremarkable in appearance. No axillary lymphadenopathy. Lungs/Pleura: Status post left lower lobectomy. Trace left pleural effusion. Trace amount of right pleural fluid and thickening with extensive right pleural calcification. No left pleural calcification. Diffuse bronchial wall thickening with moderate centrilobular and paraseptal emphysema no acute consolidative airspace disease. No definite suspicious appearing pulmonary nodules or masses are noted. Musculoskeletal: There are no aggressive appearing lytic or blastic lesions noted in the visualized portions of the skeleton. CT ABDOMEN PELVIS FINDINGS Hepatobiliary: Tiny subcentimeter low-attenuation lesions in the  liver, too small to definitively characterize, but statistically likely to represent tiny cysts. No other larger more suspicious appearing hepatic lesions. No intra or extrahepatic biliary ductal dilatation. Gallbladder is normal in appearance. Pancreas: No pancreatic mass. No pancreatic ductal dilatation. No pancreatic or peripancreatic fluid collections or inflammatory changes. Spleen: Unremarkable. Adrenals/Urinary Tract: A few subcentimeter low-attenuation lesions are noted in both kidneys, too small to characterize, but statistically likely to represent tiny cysts. Bilateral adrenal glands are normal in appearance. No hydroureteronephrosis. Urinary bladder is normal in appearance. Stomach/Bowel: Normal appearance of the stomach. No pathologic dilatation of small bowel or colon. Normal appendix. Vascular/Lymphatic: Aortic atherosclerosis with postprocedural changes of aorto bi-iliac stent graft, which appears patent bilaterally. No lymphadenopathy noted in the abdomen or pelvis. Reproductive: Prostate gland and seminal vesicles are unremarkable in appearance. Other: No significant volume of ascites.  No pneumoperitoneum. Musculoskeletal: There are no aggressive appearing lytic or blastic lesions noted in the visualized portions of the skeleton. IMPRESSION: 1. Status post left lower lobectomy. No findings to suggest local recurrence of disease or definite metastatic disease in the chest, abdomen or pelvis. 2. Trace bilateral pleural effusions, along with some chronic right-sided pleural thickening calcification, stable compared to prior examinations. 3. Diffuse bronchial wall thickening with moderate centrilobular and paraseptal emphysema; imaging findings suggestive of underlying COPD. 4. Aortic atherosclerosis, in addition to left main coronary artery disease. Status post aorto bi-iliac stent graft. 5. Additional incidental findings, as above. Electronically Signed   By: DVinnie LangtonM.D.   On: 04/26/2020  09:49    ASSESSMENT AND PLAN:  This is a very pleasant 65 years old white male with a stage IB non-small cell lung cancer, adenocarcinoma status post wedge resection of the left upper lobe followed by 4 cycles of adjuvant systemic chemotherapy with cisplatin and Alimta and he tolerated his treatment well except for fatigue. The patient has been on observation since June 2018.   The recent imaging studies including CT scan of the chest as well as a PET scan showed evidence for disease recurrence with metastatic disease presented with bilateral pulmonary nodules as well as destructive bone lesions at T4 vertebral body, biopsy proven to be metastatic adenocarcinoma. Molecular studies by foundation 1 showed no actionable mutations.  PDL 1 expression was negative. The patient is currently undergoing treatment with carboplatin, Alimta and Ketruda (pembrolizumab) status post 40 cycles.  Starting from cycle #5 he is on maintenance treatment with Alimta and Keytruda every 3 weeks. The patient continues to tolerate this treatment well with no concerning adverse effect except for mild fatigue and swelling of the lower extremities. He had repeat CT scan of the chest, abdomen pelvis performed recently.  I personally and independently reviewed the scans and discussed the results with the patient today. Has a scan showed no concerning findings for disease progression. I recommended for the patient to continue his current treatment with maintenance Alimta and Keytruda and he will proceed with cycle #41 today. The patient will come back for follow-up visit in 3 weeks for evaluation before the next cycle of his treatment. He was advised to call immediately if he has any concerning symptoms in the interval. The patient voices understanding of current disease status and treatment options and is in agreement with the current care plan. All questions were answered. The patient knows to call the clinic with any problems,  questions or concerns. We can certainly see the patient much sooner if necessary. Disclaimer: This note was dictated with voice recognition software. Similar sounding words can inadvertently be transcribed and may be missed upon review. Eilleen Kempf, MD 04/28/20

## 2020-05-03 ENCOUNTER — Telehealth: Payer: Self-pay | Admitting: Internal Medicine

## 2020-05-03 NOTE — Telephone Encounter (Signed)
Scheduled per 1/6 los. Called and spoke with pt, confirmed 1/27 appts

## 2020-05-17 NOTE — Progress Notes (Signed)
[facility] OFFICE PROGRESS NOTE  [name], [address] [place] [address]  DIAGNOSIS: Metastatic non-small cell lung cancer, adenocarcinoma initially diagnosed as stage IB (T2a, N0, M0) non-small cell lung cancer, adenocarcinoma diagnosed in December 2017. The patient has evidence for disease recurrence in July 2019.  Biomarker Findings Tumor Mutational Burden - TMB-Intermediate (16 Muts/Mb) Microsatellite status - MS-Stable Genomic Findings For a complete list of the genes assayed, please refer to the Appendix. KIT amplification KRAS P61P SMAD4 splice site [id number] TI45 I232F 7 Disease relevant genes with no reportable alterations: EGFR, ALK, BRAF, MET, RET, ERBB2, ROS1   PDL 1 expression is 0%  PRIOR THERAPY: 1) status post left lower lobectomy as well as wedge resection of the left upper lobe on 05/11/2016. 2) Adjuvant systemic chemotherapy with cisplatin 75 MG/M2 and Alimta 500 MG/M2 every 3 weeks. First dose 07/03/2016. Status post 4 cycles.  CURRENT THERAPY: : Systemic chemotherapy with carboplatin for AUC of 5, Alimta 500 mg/M2 and Keytruda 200 mg IV every 3 weeks. Status post 41cycles. Starting from cycle #5 the patient will be treated with maintenance Alimta and Ketruda (pembrolizumab) every 3 weeks.  INTERVAL HISTORY: [name] 65 y.o. male returns to the clinic today for a follow-up visit. The patient is feeling well today without any concerning complaints.  He continues to tolerate his maintenance systemic chemotherapy with Alimta and Keytruda fairly well. Alimta was held for his last cycle of treatment due to renal insuffiencey. He denied having any chest pain, shortness of breath, cough or hemoptysis. He denied having any fever or chills. He has no nausea, vomiting, diarrhea or constipation. He denied having any headache or visual changes. The patient is here today for evaluation before starting cycle #42.  MEDICAL  HISTORY: Past Medical History:  Diagnosis Date  . AAA (abdominal aortic aneurysm) ([place])   . AAA (abdominal aortic aneurysm) without rupture ([place]) 02/04/2014  . Cancer ([place])    lung  . Encounter for antineoplastic chemotherapy 06/06/2016  . History of hepatitis B   . HNP (herniated nucleus pulposus), lumbar    L4 with radiculopathy  . Hypertension   . Lung cancer ([place]) 05/18/2016  . Malignant neoplasm of lower lobe of left lung ([place]) 04/05/2016  . Mass of left lung   . Mitral insufficiency 04/06/2016   This patient will eventually need MV repair if his prognosis is good from oncology standpoint. However, his lung cancer therapy  is the priority right now. His cardiac condition won't preclude possible lung surgery.  Once his lung cancer is under control he will need a TEE to further evaluate his mitral valve anatomy and MR severity, and also have ischemic workup as tere is evidence on calcificati  . Renal insufficiency 10/09/2016  . S/P partial lobectomy of lung 05/11/2016  . Varicose veins of legs     ALLERGIES:  is allergic to tramadol.  MEDICATIONS:  Current Outpatient Medications  Medication Sig Dispense Refill  . carvedilol (COREG) 6.25 MG tablet Take 1 tablet (6.25 mg total) by mouth 2 (two) times daily. 180 tablet 3  . lisinopril (ZESTRIL) 2.5 MG tablet TAKE 1 TABLET BY MOUTH EVERY DAY 90 tablet 3  . folic acid (FOLVITE) 1 MG tablet Take 1 tablet (1 mg total) by mouth daily. 90 tablet 1   No current facility-administered medications for this visit.    SURGICAL HISTORY:  Past Surgical History:  Procedure Laterality Date  . ABDOMINAL AORTIC ENDOVASCULAR STENT  GRAFT N/A 03/12/2016   Procedure: ABDOMINAL AORTIC ENDOVASCULAR STENT GRAFT;  Surgeon: Conrad Kidron, MD;  Location: [facility];  Service: Vascular;  Laterality: N/A;  . COLONOSCOPY    . INGUINAL HERNIA REPAIR Left 1975   Left inguinal hernia  . INGUINAL HERNIA REPAIR Right   . IR IMAGING GUIDED PORT INSERTION  09/04/2019  .  LOBECTOMY Left 05/11/2016   Procedure: LEFT LOWER LOBE LOBECTOMY AND LEFT UPPER LOBE  RESECTION AND PLACEMENT OF ON-Q;  Surgeon: [name], MD;  Location: [facility];  Service: Thoracic;  Laterality: Left;  . LUMBAR LAMINECTOMY  October 22, 2012  . LYMPH NODE DISSECTION Left 05/11/2016   Procedure: LYMPH NODE DISSECTION;  Surgeon: [name], MD;  Location: [facility];  Service: Thoracic;  Laterality: Left;  . RIGHT/LEFT HEART CATH AND CORONARY ANGIOGRAPHY N/A 03/10/2020   Procedure: RIGHT/LEFT HEART CATH AND CORONARY ANGIOGRAPHY;  Surgeon: Sherren Mocha, MD;  Location: [facility];  Service: Cardiovascular;  Laterality: N/A;  . STAPLING OF BLEBS Left 05/11/2016   Procedure: STAPLING OF APICAL BLEB;  Surgeon: [name], MD;  Location: [facility];  Service: Thoracic;  Laterality: Left;  . TEE WITHOUT CARDIOVERSION N/A 10/07/2019   Procedure: TRANSESOPHAGEAL ECHOCARDIOGRAM (TEE);  Surgeon: Dorothy Spark, MD;  Location[facility];  Service: Cardiovascular;  Laterality: N/A;  . VIDEO ASSISTED THORACOSCOPY Left 05/18/2016   Procedure: VIDEO ASSISTED THORACOSCOPY WITH REMOVAL OF LEFT APICAL BLEB;  Surgeon: [name], MD;  Location: [facility];  Service: Thoracic;  Laterality: Left;  [facility] VIDEO ASSISTED THORACOSCOPY (VATS)/WEDGE RESECTION Left 05/11/2016   Procedure: LEFT VIDEO ASSISTED THORACOSCOPY (VATS);  Surgeon: [name], MD;  Location: [facility];  Service: Thoracic;  Laterality: Left;  Marland Kitchen VIDEO BRONCHOSCOPY N/A 05/11/2016   Procedure: VIDEO BRONCHOSCOPY, LEFT LUNG;  Surgeon: [name], MD;  Location: [facility];  Service: Thoracic;  Laterality: N/A;  . VIDEO BRONCHOSCOPY N/A 05/18/2016   Procedure: VIDEO BRONCHOSCOPY WITH BRONCHIAL WASHING;  Surgeon: [name], MD;  Location: [facility];  Service: Thoracic;  Laterality: N/A;    REVIEW OF SYSTEMS:   Review of Systems  Constitutional: Negative for appetite change, chills, fatigue, fever and unexpected weight change.  HENT:    Negative for mouth sores, nosebleeds, sore throat and trouble swallowing.   Eyes: Negative for eye problems and icterus.  Respiratory: Negative for cough, hemoptysis, shortness of breath and wheezing.   Cardiovascular: Negative for chest pain and leg swelling.  Gastrointestinal: Negative for abdominal pain, constipation, diarrhea, nausea and vomiting.  Genitourinary: Negative for bladder incontinence, difficulty urinating, dysuria, frequency and hematuria.   Musculoskeletal: Negative for back pain, gait problem, neck pain and neck stiffness.  Skin: Negative for itching and rash.  Neurological: Negative for dizziness, extremity weakness, gait problem, headaches, light-headedness and seizures.  Hematological: Negative for adenopathy. Does not bruise/bleed easily.  Psychiatric/Behavioral: Negative for confusion, depression and sleep disturbance. The patient is not nervous/anxious.     PHYSICAL EXAMINATION:  Blood pressure 108/66, pulse 79, temperature (!) 97 F (36.1 C), temperature source Tympanic, resp. rate 15, height 6' 3.5" (1.918 m), weight 151 lb 14.4 oz (68.9 kg), SpO2 97 %.  ECOG PERFORMANCE STATUS: 1 - Symptomatic but completely ambulatory  Physical Exam  Constitutional: Oriented to person, place, and time and well-developed, well-nourished, and in no distress.  HENT:  Head: Normocephalic and atraumatic.  Mouth/Throat: Oropharynx is clear and moist. No oropharyngeal exudate.  Eyes: Conjunctivae are normal. Right eye exhibits no  discharge. Left eye exhibits no discharge. No scleral icterus.  Neck: Normal range of motion. Neck supple.  Cardiovascular:Systolic murmur auscultatedin hisposterior left thorax.Normal rate, regular rhythm and intact distal pulses.  Pulmonary/Chest: Effort normal and breath sounds normal. No respiratory distress. No wheezes. No rales.  Abdominal: Soft. Bowel sounds are normal. Exhibits no distension and no mass. There is no tenderness.   Musculoskeletal: Normal range of motion. Exhibits no edema.  Lymphadenopathy:  No cervical adenopathy.  Neurological: Alert and oriented to person, place, and time. Exhibits normal muscle tone. Gait normal. Coordination normal.  Skin: Skin is warm and dry. No rash noted. Not diaphoretic. No erythema. No pallor.  Psychiatric: Mood, memory and judgment normal.  Vitals reviewed.  LABORATORY DATA: Lab Results  Component Value Date   WBC 7.6 05/19/2020   HGB 12.7 (L) 05/19/2020   HCT 39.1 05/19/2020   MCV 102.9 (H) 05/19/2020   PLT 309 05/19/2020      Chemistry      Component Value Date/Time   NA 132 (L) 04/28/2020 0946   NA 132 (L) 10/05/2019 0830   NA 138 01/11/2017 1343   K 3.7 04/28/2020 0946   K 4.7 01/11/2017 1343   CL 103 04/28/2020 0946   CO2 20 (L) 04/28/2020 0946   CO2 23 01/11/2017 1343   BUN 20 04/28/2020 0946   BUN 14 10/05/2019 0830   BUN 20.2 01/11/2017 1343   CREATININE 1.82 (H) 04/28/2020 0946   CREATININE 1.6 (H) 01/11/2017 1343      Component Value Date/Time   CALCIUM 8.5 (L) 04/28/2020 0946   CALCIUM 9.3 01/11/2017 1343   ALKPHOS 74 04/28/2020 0946   ALKPHOS 89 01/11/2017 1343   AST 23 04/28/2020 0946   AST 41 (H) 01/11/2017 1343   ALT 9 04/28/2020 0946   ALT 27 01/11/2017 1343   BILITOT 0.5 04/28/2020 0946   BILITOT 0.49 01/11/2017 1343       RADIOGRAPHIC STUDIES:  CT Chest W Contrast  Result Date: 04/26/2020 CLINICAL DATA:  65 year old male with history of non-small cell lung cancer currently undergoing chemotherapy. Follow-up study. EXAM: CT CHEST, ABDOMEN, AND PELVIS WITH CONTRAST TECHNIQUE: Multidetector CT imaging of the chest, abdomen and pelvis was performed following the standard protocol during bolus administration of intravenous contrast. CONTRAST:  177m OMNIPAQUE IOHEXOL 300 MG/ML  SOLN COMPARISON:  CT the chest, abdomen and pelvis 01/15/2020. FINDINGS: CT CHEST FINDINGS Cardiovascular: Heart size is normal. No significant  pericardial fluid, thickening or calcification. There is aortic atherosclerosis, as well as atherosclerosis of the great vessels of the mediastinum and the coronary arteries, including calcified atherosclerotic plaque in the left main coronary artery. Right internal jugular single-lumen porta cath with tip terminating in the distal superior vena cava. Mediastinum/Nodes: No pathologically enlarged mediastinal or hilar lymph nodes. Esophagus is unremarkable in appearance. No axillary lymphadenopathy. Lungs/Pleura: Status post left lower lobectomy. Trace left pleural effusion. Trace amount of right pleural fluid and thickening with extensive right pleural calcification. No left pleural calcification. Diffuse bronchial wall thickening with moderate centrilobular and paraseptal emphysema no acute consolidative airspace disease. No definite suspicious appearing pulmonary nodules or masses are noted. Musculoskeletal: There are no aggressive appearing lytic or blastic lesions noted in the visualized portions of the skeleton. CT ABDOMEN PELVIS FINDINGS Hepatobiliary: Tiny subcentimeter low-attenuation lesions in the liver, too small to definitively characterize, but statistically likely to represent tiny cysts. No other larger more suspicious appearing hepatic lesions. No intra or extrahepatic biliary ductal dilatation. Gallbladder is normal in appearance.  Pancreas: No pancreatic mass. No pancreatic ductal dilatation. No pancreatic or peripancreatic fluid collections or inflammatory changes. Spleen: Unremarkable. Adrenals/Urinary Tract: A few subcentimeter low-attenuation lesions are noted in both kidneys, too small to characterize, but statistically likely to represent tiny cysts. Bilateral adrenal glands are normal in appearance. No hydroureteronephrosis. Urinary bladder is normal in appearance. Stomach/Bowel: Normal appearance of the stomach. No pathologic dilatation of small bowel or colon. Normal appendix.  Vascular/Lymphatic: Aortic atherosclerosis with postprocedural changes of aorto bi-iliac stent graft, which appears patent bilaterally. No lymphadenopathy noted in the abdomen or pelvis. Reproductive: Prostate gland and seminal vesicles are unremarkable in appearance. Other: No significant volume of ascites.  No pneumoperitoneum. Musculoskeletal: There are no aggressive appearing lytic or blastic lesions noted in the visualized portions of the skeleton. IMPRESSION: 1. Status post left lower lobectomy. No findings to suggest local recurrence of disease or definite metastatic disease in the chest, abdomen or pelvis. 2. Trace bilateral pleural effusions, along with some chronic right-sided pleural thickening calcification, stable compared to prior examinations. 3. Diffuse bronchial wall thickening with moderate centrilobular and paraseptal emphysema; imaging findings suggestive of underlying COPD. 4. Aortic atherosclerosis, in addition to left main coronary artery disease. Status post aorto bi-iliac stent graft. 5. Additional incidental findings, as above. Electronically Signed   By: Vinnie Langton M.D.   On: 04/26/2020 09:49   CT Abdomen Pelvis W Contrast  Result Date: 04/26/2020 CLINICAL DATA:  65 year old male with history of non-small cell lung cancer currently undergoing chemotherapy. Follow-up study. EXAM: CT CHEST, ABDOMEN, AND PELVIS WITH CONTRAST TECHNIQUE: Multidetector CT imaging of the chest, abdomen and pelvis was performed following the standard protocol during bolus administration of intravenous contrast. CONTRAST:  178m OMNIPAQUE IOHEXOL 300 MG/ML  SOLN COMPARISON:  CT the chest, abdomen and pelvis 01/15/2020. FINDINGS: CT CHEST FINDINGS Cardiovascular: Heart size is normal. No significant pericardial fluid, thickening or calcification. There is aortic atherosclerosis, as well as atherosclerosis of the great vessels of the mediastinum and the coronary arteries, including calcified atherosclerotic  plaque in the left main coronary artery. Right internal jugular single-lumen porta cath with tip terminating in the distal superior vena cava. Mediastinum/Nodes: No pathologically enlarged mediastinal or hilar lymph nodes. Esophagus is unremarkable in appearance. No axillary lymphadenopathy. Lungs/Pleura: Status post left lower lobectomy. Trace left pleural effusion. Trace amount of right pleural fluid and thickening with extensive right pleural calcification. No left pleural calcification. Diffuse bronchial wall thickening with moderate centrilobular and paraseptal emphysema no acute consolidative airspace disease. No definite suspicious appearing pulmonary nodules or masses are noted. Musculoskeletal: There are no aggressive appearing lytic or blastic lesions noted in the visualized portions of the skeleton. CT ABDOMEN PELVIS FINDINGS Hepatobiliary: Tiny subcentimeter low-attenuation lesions in the liver, too small to definitively characterize, but statistically likely to represent tiny cysts. No other larger more suspicious appearing hepatic lesions. No intra or extrahepatic biliary ductal dilatation. Gallbladder is normal in appearance. Pancreas: No pancreatic mass. No pancreatic ductal dilatation. No pancreatic or peripancreatic fluid collections or inflammatory changes. Spleen: Unremarkable. Adrenals/Urinary Tract: A few subcentimeter low-attenuation lesions are noted in both kidneys, too small to characterize, but statistically likely to represent tiny cysts. Bilateral adrenal glands are normal in appearance. No hydroureteronephrosis. Urinary bladder is normal in appearance. Stomach/Bowel: Normal appearance of the stomach. No pathologic dilatation of small bowel or colon. Normal appendix. Vascular/Lymphatic: Aortic atherosclerosis with postprocedural changes of aorto bi-iliac stent graft, which appears patent bilaterally. No lymphadenopathy noted in the abdomen or pelvis. Reproductive: Prostate gland and  seminal vesicles are unremarkable in appearance. Other: No significant volume of ascites.  No pneumoperitoneum. Musculoskeletal: There are no aggressive appearing lytic or blastic lesions noted in the visualized portions of the skeleton. IMPRESSION: 1. Status post left lower lobectomy. No findings to suggest local recurrence of disease or definite metastatic disease in the chest, abdomen or pelvis. 2. Trace bilateral pleural effusions, along with some chronic right-sided pleural thickening calcification, stable compared to prior examinations. 3. Diffuse bronchial wall thickening with moderate centrilobular and paraseptal emphysema; imaging findings suggestive of underlying COPD. 4. Aortic atherosclerosis, in addition to left main coronary artery disease. Status post aorto bi-iliac stent graft. 5. Additional incidental findings, as above. Electronically Signed   By: Vinnie Langton M.D.   On: 04/26/2020 09:49     ASSESSMENT/PLAN:  This is a very pleasant 65year old [place] American male with metastatic disease who was initially diagnosed with stage IB non-small cell lung cancer, adenocarcinoma of the left upper lobe. Molecular studies showed no actionable mutations. PD-L1 expression was negative. First diagnosed December 2017.  He was status post a wedge resection of the left upper lobe followed by 4 cycles of adjuvant systemic chemotherapy with cisplatin and Alimta. He tolerated treatment well except for fatigue.  He had been on observationuntilJune of 2018.  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  Theis currently undergoingsystemic chemotherapy with carboplatin, alimta, and keytruda. He is status post41cycles. Starting from cycle #5, he has been on maintenance Alimta and Keytruda every 3 weeks   Labs were reviewed. His creatinine is 1.50 today.  Reviewed the labs with Dr. Julien Nordmann. Recommend that he proceed with cycle #42 today as scheduled with both Alimta 500 mg/m2 and Keytruda 200 mg.   We will see him back for a follow up visit in 3 weeks for evaluation before starting cycle #43.  I have refilled his folic acid today.   The patient was encouraged to stay hydrated.   The patient was advised to call immediately if he has any concerning symptoms in the interval. The patient voices understanding of current disease status and treatment options and is in agreement with the current care plan. All questions were answered. The patient knows to call the clinic with any problems, questions or concerns. We can certainly see the patient much sooner if necessary      No orders of the defined types were placed in this encounter.    I spent 20-29 minutes in this encounter today.   Massimo Hartland L Ether Wolters, PA-C 05/19/20

## 2020-05-19 ENCOUNTER — Inpatient Hospital Stay (HOSPITAL_BASED_OUTPATIENT_CLINIC_OR_DEPARTMENT_OTHER): Payer: Federal, State, Local not specified - PPO | Admitting: Physician Assistant

## 2020-05-19 ENCOUNTER — Inpatient Hospital Stay: Payer: Federal, State, Local not specified - PPO

## 2020-05-19 ENCOUNTER — Encounter: Payer: Self-pay | Admitting: Physician Assistant

## 2020-05-19 ENCOUNTER — Other Ambulatory Visit: Payer: Self-pay

## 2020-05-19 VITALS — BP 108/66 | HR 79 | Temp 97.0°F | Resp 15 | Ht 75.5 in | Wt 151.9 lb

## 2020-05-19 DIAGNOSIS — C3492 Malignant neoplasm of unspecified part of left bronchus or lung: Secondary | ICD-10-CM

## 2020-05-19 DIAGNOSIS — Z79899 Other long term (current) drug therapy: Secondary | ICD-10-CM | POA: Diagnosis not present

## 2020-05-19 DIAGNOSIS — Z5112 Encounter for antineoplastic immunotherapy: Secondary | ICD-10-CM | POA: Diagnosis not present

## 2020-05-19 DIAGNOSIS — Z95828 Presence of other vascular implants and grafts: Secondary | ICD-10-CM

## 2020-05-19 DIAGNOSIS — C3412 Malignant neoplasm of upper lobe, left bronchus or lung: Secondary | ICD-10-CM | POA: Diagnosis not present

## 2020-05-19 DIAGNOSIS — Z5111 Encounter for antineoplastic chemotherapy: Secondary | ICD-10-CM | POA: Diagnosis not present

## 2020-05-19 DIAGNOSIS — C3432 Malignant neoplasm of lower lobe, left bronchus or lung: Secondary | ICD-10-CM

## 2020-05-19 LAB — CBC WITH DIFFERENTIAL (CANCER CENTER ONLY)
Abs Immature Granulocytes: 0.02 10*3/uL (ref 0.00–0.07)
Basophils Absolute: 0.1 10*3/uL (ref 0.0–0.1)
Basophils Relative: 1 %
Eosinophils Absolute: 0.5 10*3/uL (ref 0.0–0.5)
Eosinophils Relative: 6 %
HCT: 39.1 % (ref 39.0–52.0)
Hemoglobin: 12.7 g/dL — ABNORMAL LOW (ref 13.0–17.0)
Immature Granulocytes: 0 %
Lymphocytes Relative: 18 %
Lymphs Abs: 1.4 10*3/uL (ref 0.7–4.0)
MCH: 33.4 pg (ref 26.0–34.0)
MCHC: 32.5 g/dL (ref 30.0–36.0)
MCV: 102.9 fL — ABNORMAL HIGH (ref 80.0–100.0)
Monocytes Absolute: 1 10*3/uL (ref 0.1–1.0)
Monocytes Relative: 13 %
Neutro Abs: 4.7 10*3/uL (ref 1.7–7.7)
Neutrophils Relative %: 62 %
Platelet Count: 309 10*3/uL (ref 150–400)
RBC: 3.8 MIL/uL — ABNORMAL LOW (ref 4.22–5.81)
RDW: 13.5 % (ref 11.5–15.5)
WBC Count: 7.6 10*3/uL (ref 4.0–10.5)
nRBC: 0 % (ref 0.0–0.2)

## 2020-05-19 LAB — CMP (CANCER CENTER ONLY)
ALT: 12 U/L (ref 0–44)
AST: 30 U/L (ref 15–41)
Albumin: 3 g/dL — ABNORMAL LOW (ref 3.5–5.0)
Alkaline Phosphatase: 84 U/L (ref 38–126)
Anion gap: 6 (ref 5–15)
BUN: 13 mg/dL (ref 8–23)
CO2: 23 mmol/L (ref 22–32)
Calcium: 8.8 mg/dL — ABNORMAL LOW (ref 8.9–10.3)
Chloride: 109 mmol/L (ref 98–111)
Creatinine: 1.5 mg/dL — ABNORMAL HIGH (ref 0.61–1.24)
GFR, Estimated: 51 mL/min — ABNORMAL LOW (ref >60)
Glucose, Bld: 100 mg/dL — ABNORMAL HIGH (ref 70–99)
Potassium: 4.1 mmol/L (ref 3.5–5.1)
Sodium: 138 mmol/L (ref 135–145)
Total Bilirubin: 0.4 mg/dL (ref 0.3–1.2)
Total Protein: 6.7 g/dL (ref 6.5–8.1)

## 2020-05-19 LAB — TSH: TSH: 2.113 u[IU]/mL (ref 0.320–4.118)

## 2020-05-19 MED ORDER — SODIUM CHLORIDE 0.9 % IV SOLN
10.0000 mg | Freq: Once | INTRAVENOUS | Status: AC
  Administered 2020-05-19: 10 mg via INTRAVENOUS
  Filled 2020-05-19: qty 10

## 2020-05-19 MED ORDER — CYANOCOBALAMIN 1000 MCG/ML IJ SOLN
INTRAMUSCULAR | Status: AC
  Filled 2020-05-19: qty 1

## 2020-05-19 MED ORDER — SODIUM CHLORIDE 0.9% FLUSH
10.0000 mL | INTRAVENOUS | Status: DC | PRN
  Administered 2020-05-19: 10 mL
  Filled 2020-05-19: qty 10

## 2020-05-19 MED ORDER — CYANOCOBALAMIN 1000 MCG/ML IJ SOLN: 1000.0000 ug | Freq: Once | INTRAMUSCULAR | Status: DC

## 2020-05-19 MED ORDER — ONDANSETRON HCL 4 MG/2ML IJ SOLN
8.0000 mg | Freq: Once | INTRAMUSCULAR | Status: AC
  Administered 2020-05-19: 8 mg via INTRAVENOUS

## 2020-05-19 MED ORDER — HEPARIN SOD (PORK) LOCK FLUSH 100 UNIT/ML IV SOLN
500.0000 [IU] | Freq: Once | INTRAVENOUS | Status: AC | PRN
  Administered 2020-05-19: 500 [IU]
  Filled 2020-05-19: qty 5

## 2020-05-19 MED ORDER — SODIUM CHLORIDE 0.9 % IV SOLN
500.0000 mg/m2 | Freq: Once | INTRAVENOUS | Status: AC
  Administered 2020-05-19: 1000 mg via INTRAVENOUS
  Filled 2020-05-19: qty 40

## 2020-05-19 MED ORDER — FOLIC ACID 1 MG PO TABS: 1.0000 mg | ORAL_TABLET | Freq: Every day | ORAL | 1 refills | Status: DC

## 2020-05-19 MED ORDER — SODIUM CHLORIDE 0.9 % IV SOLN
Freq: Once | INTRAVENOUS | Status: AC
  Filled 2020-05-19: qty 250

## 2020-05-19 MED ORDER — SODIUM CHLORIDE 0.9% FLUSH
10.0000 mL | Freq: Once | INTRAVENOUS | Status: AC
  Administered 2020-05-19: 10 mL
  Filled 2020-05-19: qty 10

## 2020-05-19 MED ORDER — ONDANSETRON HCL 4 MG/2ML IJ SOLN
INTRAMUSCULAR | Status: AC
  Filled 2020-05-19: qty 4

## 2020-05-19 MED ORDER — SODIUM CHLORIDE 0.9 % IV SOLN
200.0000 mg | Freq: Once | INTRAVENOUS | Status: AC
  Administered 2020-05-19: 200 mg via INTRAVENOUS
  Filled 2020-05-19: qty 8

## 2020-05-19 NOTE — Patient Instructions (Signed)
[facility] Discharge Instructions for Patients Receiving Chemotherapy  Today you received the following chemotherapy agents Keytruda; Alimta  To help prevent nausea and vomiting after your treatment, we encourage you to take your nausea medication as directed   If you develop nausea and vomiting that is not controlled by your nausea medication, call the clinic.   BELOW ARE SYMPTOMS THAT SHOULD BE REPORTED IMMEDIATELY:  *FEVER GREATER THAN 100.5 F  *CHILLS WITH OR WITHOUT FEVER  NAUSEA AND VOMITING THAT IS NOT CONTROLLED WITH YOUR NAUSEA MEDICATION  *UNUSUAL SHORTNESS OF BREATH  *UNUSUAL BRUISING OR BLEEDING  TENDERNESS IN MOUTH AND THROAT WITH OR WITHOUT PRESENCE OF ULCERS  *URINARY PROBLEMS  *BOWEL PROBLEMS  UNUSUAL RASH Items with * indicate a potential emergency and should be followed up as soon as possible.  Feel free to call the clinic should you have any questions or concerns. The clinic phone number is [phone].  Please show the CHEMO ALERT CARD at check-in to the Emergency Department and triage nurse.   

## 2020-05-20 ENCOUNTER — Telehealth: Payer: Self-pay | Admitting: Physician Assistant

## 2020-05-20 NOTE — Telephone Encounter (Signed)
Scheduled appointments per 1/27 los. Will have updated calendar printed for patient at next visit.

## 2020-06-08 ENCOUNTER — Other Ambulatory Visit: Payer: Self-pay

## 2020-06-08 ENCOUNTER — Ambulatory Visit (HOSPITAL_COMMUNITY): Payer: Federal, State, Local not specified - PPO | Attending: Cardiology

## 2020-06-08 DIAGNOSIS — I340 Nonrheumatic mitral (valve) insufficiency: Secondary | ICD-10-CM

## 2020-06-08 LAB — ECHOCARDIOGRAM COMPLETE
Area-P 1/2: 4.57 cm2
MV M vel: 4.76 m/s
MV Peak grad: 90.6 mmHg
S' Lateral: 3.1 cm

## 2020-06-08 MED FILL — Dexamethasone Sodium Phosphate Inj 100 MG/10ML: INTRAMUSCULAR | Qty: 1 | Status: AC

## 2020-06-09 ENCOUNTER — Telehealth: Payer: Self-pay | Admitting: Cardiology

## 2020-06-09 ENCOUNTER — Inpatient Hospital Stay: Payer: Federal, State, Local not specified - PPO | Attending: Internal Medicine

## 2020-06-09 ENCOUNTER — Inpatient Hospital Stay: Payer: Federal, State, Local not specified - PPO

## 2020-06-09 ENCOUNTER — Inpatient Hospital Stay (HOSPITAL_BASED_OUTPATIENT_CLINIC_OR_DEPARTMENT_OTHER): Payer: Federal, State, Local not specified - PPO | Admitting: Internal Medicine

## 2020-06-09 ENCOUNTER — Other Ambulatory Visit: Payer: Self-pay

## 2020-06-09 VITALS — BP 92/62 | HR 78 | Temp 97.7°F | Resp 17 | Ht 75.5 in | Wt 148.9 lb

## 2020-06-09 DIAGNOSIS — I10 Essential (primary) hypertension: Secondary | ICD-10-CM | POA: Insufficient documentation

## 2020-06-09 DIAGNOSIS — R5383 Other fatigue: Secondary | ICD-10-CM | POA: Insufficient documentation

## 2020-06-09 DIAGNOSIS — R21 Rash and other nonspecific skin eruption: Secondary | ICD-10-CM | POA: Diagnosis not present

## 2020-06-09 DIAGNOSIS — Z5112 Encounter for antineoplastic immunotherapy: Secondary | ICD-10-CM

## 2020-06-09 DIAGNOSIS — C3412 Malignant neoplasm of upper lobe, left bronchus or lung: Secondary | ICD-10-CM | POA: Insufficient documentation

## 2020-06-09 DIAGNOSIS — C3432 Malignant neoplasm of lower lobe, left bronchus or lung: Secondary | ICD-10-CM | POA: Diagnosis not present

## 2020-06-09 DIAGNOSIS — Z95828 Presence of other vascular implants and grafts: Secondary | ICD-10-CM

## 2020-06-09 DIAGNOSIS — Z79899 Other long term (current) drug therapy: Secondary | ICD-10-CM | POA: Diagnosis not present

## 2020-06-09 DIAGNOSIS — C3492 Malignant neoplasm of unspecified part of left bronchus or lung: Secondary | ICD-10-CM

## 2020-06-09 DIAGNOSIS — N289 Disorder of kidney and ureter, unspecified: Secondary | ICD-10-CM | POA: Insufficient documentation

## 2020-06-09 LAB — CBC WITH DIFFERENTIAL (CANCER CENTER ONLY)
Abs Immature Granulocytes: 0.04 10*3/uL (ref 0.00–0.07)
Basophils Absolute: 0.1 10*3/uL (ref 0.0–0.1)
Basophils Relative: 1 %
Eosinophils Absolute: 0.3 10*3/uL (ref 0.0–0.5)
Eosinophils Relative: 5 %
HCT: 36.1 % — ABNORMAL LOW (ref 39.0–52.0)
Hemoglobin: 11.9 g/dL — ABNORMAL LOW (ref 13.0–17.0)
Immature Granulocytes: 1 %
Lymphocytes Relative: 25 %
Lymphs Abs: 1.6 10*3/uL (ref 0.7–4.0)
MCH: 33.4 pg (ref 26.0–34.0)
MCHC: 33 g/dL (ref 30.0–36.0)
MCV: 101.4 fL — ABNORMAL HIGH (ref 80.0–100.0)
Monocytes Absolute: 1.2 10*3/uL — ABNORMAL HIGH (ref 0.1–1.0)
Monocytes Relative: 19 %
Neutro Abs: 3.2 10*3/uL (ref 1.7–7.7)
Neutrophils Relative %: 49 %
Platelet Count: 491 10*3/uL — ABNORMAL HIGH (ref 150–400)
RBC: 3.56 MIL/uL — ABNORMAL LOW (ref 4.22–5.81)
RDW: 14.3 % (ref 11.5–15.5)
WBC Count: 6.4 10*3/uL (ref 4.0–10.5)
nRBC: 0 % (ref 0.0–0.2)

## 2020-06-09 LAB — CMP (CANCER CENTER ONLY)
ALT: 20 U/L (ref 0–44)
AST: 37 U/L (ref 15–41)
Albumin: 3 g/dL — ABNORMAL LOW (ref 3.5–5.0)
Alkaline Phosphatase: 85 U/L (ref 38–126)
Anion gap: 7 (ref 5–15)
BUN: 16 mg/dL (ref 8–23)
CO2: 21 mmol/L — ABNORMAL LOW (ref 22–32)
Calcium: 8.5 mg/dL — ABNORMAL LOW (ref 8.9–10.3)
Chloride: 103 mmol/L (ref 98–111)
Creatinine: 1.75 mg/dL — ABNORMAL HIGH (ref 0.61–1.24)
GFR, Estimated: 42 mL/min — ABNORMAL LOW (ref >60)
Glucose, Bld: 93 mg/dL (ref 70–99)
Potassium: 4.1 mmol/L (ref 3.5–5.1)
Sodium: 131 mmol/L — ABNORMAL LOW (ref 135–145)
Total Bilirubin: 0.4 mg/dL (ref 0.3–1.2)
Total Protein: 6.9 g/dL (ref 6.5–8.1)

## 2020-06-09 LAB — TSH: TSH: 1.944 u[IU]/mL (ref 0.320–4.118)

## 2020-06-09 MED ORDER — SODIUM CHLORIDE 0.9% FLUSH
10.0000 mL | Freq: Once | INTRAVENOUS | Status: AC
  Administered 2020-06-09: 10 mL
  Filled 2020-06-09: qty 10

## 2020-06-09 MED ORDER — HEPARIN SOD (PORK) LOCK FLUSH 100 UNIT/ML IV SOLN
500.0000 [IU] | Freq: Once | INTRAVENOUS | Status: AC
  Administered 2020-06-09: 500 [IU] via INTRAVENOUS
  Filled 2020-06-09: qty 5

## 2020-06-09 MED ORDER — VALACYCLOVIR HCL 500 MG PO TABS: 500.0000 mg | ORAL_TABLET | Freq: Two times a day (BID) | ORAL | 0 refills | Status: DC

## 2020-06-09 MED ORDER — SODIUM CHLORIDE 0.9% FLUSH
10.0000 mL | Freq: Once | INTRAVENOUS | Status: AC
  Administered 2020-06-09: 10 mL via INTRAVENOUS
  Filled 2020-06-09: qty 10

## 2020-06-09 NOTE — Patient Instructions (Signed)

## 2020-06-09 NOTE — Progress Notes (Signed)
Per Curt Bears, MD ok to treat with SCr of 1.75 for [name], pt will not receive alimta today (06/09/2020)  Pt arrived to the infusion center and when answering intake questions states that he "has singles" on his back and side. MD was aware and pt did not report any open sores. Sandi Mealy, PA was called and assessed pt.  pt was sent home, with a medication to pick up from his pharmacy. Pt is aware that he will need to be rescheduled later after his singles scab over. All questions were answered and pt was agreeable to the plan.

## 2020-06-09 NOTE — Telephone Encounter (Signed)
Attempted to call, no answer and unable to leave VM.

## 2020-06-09 NOTE — Progress Notes (Signed)
[facility] [phone]   [phone]  OFFICE PROGRESS NOTE  [name], MD [address] [place] [address]  DIAGNOSIS: Metastatic non-small cell lung cancer, adenocarcinoma initially diagnosed as stage IB (T2a, N0, M0) non-small cell lung cancer, adenocarcinoma diagnosed in December 2017.  The patient has evidence for disease recurrence in July 2019.  Biomarker Findings Tumor Mutational Burden - TMB-Intermediate (16 Muts/Mb) Microsatellite status - MS-Stable Genomic Findings For a complete list of the genes assayed, please refer to the Appendix. KIT amplification KRAS H41P SMAD4 splice site 3790-2I>O XB35 I232F 7 Disease relevant genes with no reportable alterations: EGFR, ALK, BRAF, MET, RET, ERBB2, ROS1   PDL 1 expression is 0%  PRIOR THERAPY:  1) status post left lower lobectomy as well as wedge resection of the left upper lobe on 05/11/2016. 2) Adjuvant systemic chemotherapy with cisplatin 75 MG/M2 and Alimta 500 MG/M2 every 3 weeks. First dose 07/03/2016. Status post 4 cycles.  CURRENT THERAPY: Systemic chemotherapy with carboplatin for AUC of 5, Alimta 500 mg/M2 and Keytruda 200 mg IV every 3 weeks.  Status post 42 cycles.  Starting from cycle #5 the patient will be treated with maintenance Alimta and Ketruda (pembrolizumab) every 3 weeks.  INTERVAL HISTORY: William Kales [name] 65 y.o. male returns to the clinic today for follow-up visit.  The patient is feeling fine today with no concerning complaints except for rash on the lower part of the abdomen with extension to the back highly suspicious for herpes zoster.  The patient denied having any current chest pain, shortness of breath, cough or hemoptysis.  He denied having any fever or chills.  He has no nausea, vomiting, diarrhea or constipation.  The patient continues to tolerate his maintenance treatment with Alimta and Keytruda fairly well.  Is here today for evaluation  before starting cycle #43.   MEDICAL HISTORY: Past Medical History:  Diagnosis Date  . AAA (abdominal aortic aneurysm) ([place])   . AAA (abdominal aortic aneurysm) without rupture ([place]) 02/04/2014  . Cancer ([place])    lung  . Encounter for antineoplastic chemotherapy 06/06/2016  . History of hepatitis B   . HNP (herniated nucleus pulposus), lumbar    L4 with radiculopathy  . Hypertension   . Lung cancer ([place]) 05/18/2016  . Malignant neoplasm of lower lobe of left lung ([place]) 04/05/2016  . Mass of left lung   . Mitral insufficiency 04/06/2016   This patient will eventually need MV repair if his prognosis is good from oncology standpoint. However, his lung cancer therapy  is the priority right now. His cardiac condition won't preclude possible lung surgery.  Once his lung cancer is under control he will need a TEE to further evaluate his mitral valve anatomy and MR severity, and also have ischemic workup as tere is evidence on calcificati  . Renal insufficiency 10/09/2016  . S/P partial lobectomy of lung 05/11/2016  . Varicose veins of legs     ALLERGIES:  is allergic to tramadol.  MEDICATIONS:  Current Outpatient Medications  Medication Sig Dispense Refill  . carvedilol (COREG) 6.25 MG tablet Take 1 tablet (6.25 mg total) by mouth 2 (two) times daily. 329 tablet 3  . folic acid (FOLVITE) 1 MG tablet Take 1 tablet (1 mg total) by mouth daily. 90 tablet 1  . lisinopril (ZESTRIL) 2.5 MG tablet TAKE 1 TABLET BY MOUTH EVERY DAY 90 tablet 3   No current facility-administered medications  for this visit.    SURGICAL HISTORY:  Past Surgical History:  Procedure Laterality Date  . ABDOMINAL AORTIC ENDOVASCULAR STENT GRAFT N/A 03/12/2016   Procedure: ABDOMINAL AORTIC ENDOVASCULAR STENT GRAFT;  Surgeon: Conrad Elgin, MD;  Location: [facility];  Service: Vascular;  Laterality: N/A;  . COLONOSCOPY    . INGUINAL HERNIA REPAIR Left 1975   Left inguinal hernia  . INGUINAL HERNIA REPAIR Right   . IR  IMAGING GUIDED PORT INSERTION  09/04/2019  . LOBECTOMY Left 05/11/2016   Procedure: LEFT LOWER LOBE LOBECTOMY AND LEFT UPPER LOBE  RESECTION AND PLACEMENT OF ON-Q;  Surgeon: [name], MD;  Location: [facility];  Service: Thoracic;  Laterality: Left;  . LUMBAR LAMINECTOMY  October 22, 2012  . LYMPH NODE DISSECTION Left 05/11/2016   Procedure: LYMPH NODE DISSECTION;  Surgeon: [name], MD;  Location: [facility];  Service: Thoracic;  Laterality: Left;  . RIGHT/LEFT HEART CATH AND CORONARY ANGIOGRAPHY N/A 03/10/2020   Procedure: RIGHT/LEFT HEART CATH AND CORONARY ANGIOGRAPHY;  Surgeon: Sherren Mocha, MD;  Location: [facility];  Service: Cardiovascular;  Laterality: N/A;  . STAPLING OF BLEBS Left 05/11/2016   Procedure: STAPLING OF APICAL BLEB;  Surgeon: [name], MD;  Location: [facility];  Service: Thoracic;  Laterality: Left;  . TEE WITHOUT CARDIOVERSION N/A 10/07/2019   Procedure: TRANSESOPHAGEAL ECHOCARDIOGRAM (TEE);  Surgeon: Dorothy Spark, MD;  Location: [facility] ENDOSCOPY;  Service: Cardiovascular;  Laterality: N/A;  . VIDEO ASSISTED THORACOSCOPY Left 05/18/2016   Procedure: VIDEO ASSISTED THORACOSCOPY WITH REMOVAL OF LEFT APICAL BLEB;  Surgeon: [name], MD;  Location: [facility];  Service: Thoracic;  Laterality: Left;  Marland Kitchen VIDEO ASSISTED THORACOSCOPY (VATS)/WEDGE RESECTION Left 05/11/2016   Procedure: LEFT VIDEO ASSISTED THORACOSCOPY (VATS);  Surgeon: [name], MD;  Location: [facility];  Service: Thoracic;  Laterality: Left;  Marland Kitchen VIDEO BRONCHOSCOPY N/A 05/11/2016   Procedure: VIDEO BRONCHOSCOPY, LEFT LUNG;  Surgeon: [name], MD;  Location: [facility];  Service: Thoracic;  Laterality: N/A;  . VIDEO BRONCHOSCOPY N/A 05/18/2016   Procedure: VIDEO BRONCHOSCOPY WITH BRONCHIAL WASHING;  Surgeon: [name], MD;  Location: [facility];  Service: Thoracic;  Laterality: N/A;    REVIEW OF SYSTEMS:  Constitutional: positive for fatigue Eyes: negative Ears, nose, mouth, throat,  and face: negative Respiratory: negative Cardiovascular: negative Gastrointestinal: negative Genitourinary:negative Integument/breast: positive for rash and skin lesion(s) Hematologic/lymphatic: negative Musculoskeletal:negative Neurological: negative Behavioral/Psych: negative Endocrine: negative Allergic/Immunologic: negative   PHYSICAL EXAMINATION: General appearance: alert, cooperative and no distress Head: Normocephalic, without obvious abnormality, atraumatic Neck: no adenopathy, no JVD, supple, symmetrical, trachea midline and thyroid not enlarged, symmetric, no tenderness/mass/nodules Lymph nodes: Cervical, supraclavicular, and axillary nodes normal. Resp: clear to auscultation bilaterally Back: symmetric, no curvature. ROM normal. No CVA tenderness. Cardio: regular rate and rhythm, S1, S2 normal, no murmur, click, rub or gallop GI: soft, non-tender; bowel sounds normal; no masses,  no organomegaly Extremities: extremities normal, atraumatic, no cyanosis or edema Neurologic: Alert and oriented X 3, normal strength and tone. Normal symmetric reflexes. Normal coordination and gait   ECOG PERFORMANCE STATUS: 1 - Symptomatic but completely ambulatory  Blood pressure 92/62, pulse 78, temperature 97.7 F (36.5 C), temperature source Tympanic, resp. rate 17, height 6' 3.5" (1.918 m), weight 148 lb 14.4 oz (67.5 kg), SpO2 97 %.  LABORATORY DATA: Lab Results  Component Value Date   WBC 7.6 05/19/2020   HGB 12.7 (L) 05/19/2020   HCT 39.1 05/19/2020  MCV 102.9 (H) 05/19/2020   PLT 309 05/19/2020      Chemistry      Component Value Date/Time   NA 138 05/19/2020 0859   NA 132 (L) 10/05/2019 0830   NA 138 01/11/2017 1343   K 4.1 05/19/2020 0859   K 4.7 01/11/2017 1343   CL 109 05/19/2020 0859   CO2 23 05/19/2020 0859   CO2 23 01/11/2017 1343   BUN 13 05/19/2020 0859   BUN 14 10/05/2019 0830   BUN 20.2 01/11/2017 1343   CREATININE 1.50 (H) 05/19/2020 0859   CREATININE  1.6 (H) 01/11/2017 1343      Component Value Date/Time   CALCIUM 8.8 (L) 05/19/2020 0859   CALCIUM 9.3 01/11/2017 1343   ALKPHOS 84 05/19/2020 0859   ALKPHOS 89 01/11/2017 1343   AST 30 05/19/2020 0859   AST 41 (H) 01/11/2017 1343   ALT 12 05/19/2020 0859   ALT 27 01/11/2017 1343   BILITOT 0.4 05/19/2020 0859   BILITOT 0.49 01/11/2017 1343       RADIOGRAPHIC STUDIES: ECHOCARDIOGRAM COMPLETE  Result Date: 06/08/2020    ECHOCARDIOGRAM REPORT   Patient Name:   William BUFFONE [name] Date of Exam: 06/08/2020 Medical Rec #:  [id number]         Height:       75.5 in Accession #:    [id number]        Weight:       151.9 lb Date of Birth:  [birth year]         BSA:          1.962 m Patient Age:    71 years          BP:           91/69 mmHg Patient Gender: M                 HR:           80 bpm. Exam Location:  [facility] Procedure: 2D Echo, Cardiac Doppler and Color Doppler Indications:    I34.0 Mitral valve insufficiency  History:        Patient has prior history of Echocardiogram examinations.                 History of lung CA, status post left lower lobectomy, then wedge                 resection of the left upper lobe. 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 On long term                 chemotherapy. AAA. 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  Sonographer:    Marygrace Drought RCS Referring Phys: [place]  1. Left ventricular ejection fraction, by estimation, is 60 to 65%. The left ventricle has normal function. The left ventricle has no regional wall motion abnormalities. There is mild left ventricular hypertrophy. Left ventricular diastolic parameters are consistent with Grade II diastolic  dysfunction (pseudonormalization).  2.  Right ventricular systolic function is normal. The right ventricular size is normal. There is normal pulmonary artery systolic pressure.  3. Left atrial size was moderately dilated.  4. The mitral valve is myxomatous. Severe mitral valve regurgitation.  5. 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  6. The aortic valve is tricuspid. Aortic valve regurgitation is not visualized. No aortic stenosis is present.  7. Aortic dilatation noted. There is moderate dilatation at the level of the sinuses of Valsalva, measuring 48 mm.  8. The inferior vena cava is normal in size with greater than 50% respiratory variability, suggesting right atrial pressure of 3 mmHg. Comparison(s): No significant change from prior study. 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 FINDINGS  Left Ventricle: Left ventricular ejection fraction, by estimation, is 60 to 65%. The left ventricle has normal function. The left ventricle has no regional wall motion abnormalities. The left ventricular internal cavity size was normal in size. There is  mild left ventricular hypertrophy. Left ventricular diastolic parameters are consistent with Grade II diastolic dysfunction (pseudonormalization). Right Ventricle: The right ventricular size is normal. Right vetricular wall thickness was not well visualized. Right ventricular systolic function is normal. There is normal pulmonary artery systolic pressure. The tricuspid regurgitant velocity is 2.59 m/s, and with an assumed right atrial pressure of 3 mmHg, the estimated right ventricular systolic pressure is 17.5 mmHg. Left Atrium: Left atrial size was moderately dilated. Right Atrium: Right atrial size was normal in size. Pericardium: Trivial pericardial effusion is present. Mitral Valve: The mitral valve is myxomatous. Severe mitral  valve regurgitation. Tricuspid Valve: 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 Aortic Valve: The aortic valve is tricuspid. Aortic valve regurgitation is not visualized. No aortic stenosis is present. Pulmonic Valve: The pulmonic valve was grossly normal. Pulmonic valve regurgitation is trivial. No evidence of pulmonic stenosis. Aorta: Aortic dilatation noted. There is moderate dilatation at the level of the sinuses of Valsalva, measuring 48 mm. Venous: The inferior vena cava is normal in size with greater than 50% respiratory variability, suggesting right atrial pressure of 3 mmHg. IAS/Shunts: The atrial septum is grossly normal.  LEFT VENTRICLE PLAX 2D LVIDd:         4.50 cm  Diastology LVIDs:         3.10 cm  LV e' medial:    5.44 cm/s LV PW:         1.20 cm  LV E/e' medial:  14.6 LV IVS:        1.40 cm  LV e' lateral:   12.10 cm/s LVOT diam:     1.90 cm  LV E/e' lateral: 6.6 LV SV:         36 LV SV Index:   18 LVOT Area:     2.84 cm  RIGHT VENTRICLE RV Basal diam:  3.00 cm RV S prime:     13.40 cm/s TAPSE (M-mode): 2.1 cm LEFT ATRIUM             Index       RIGHT ATRIUM           Index LA diam:        3.60 cm 1.83 cm/m  RA Area:     12.50 cm LA Vol (A2C):   78.4 ml 39.96 ml/m RA Volume:   33.30 ml  16.97 ml/m LA Vol (  A4C):   44.8 ml 22.81 ml/m LA Biplane Vol: 70.8 ml 36.09 ml/m  AORTIC VALVE LVOT Vmax:   102.00 cm/s LVOT Vmean:  62.900 cm/s LVOT VTI:    0.126 m  AORTA Ao Root diam: 4.80 cm Ao Asc diam:  4.10 cm MITRAL VALVE               TRICUSPID VALVE MV Area (PHT): 4.57 cm    TR Peak grad:   26.8 mmHg MV Decel Time: 166 msec    TR Vmax:        259.00 cm/s MR Peak grad: 90.6 mmHg MR Mean grad: 51.0 mmHg    SHUNTS MR Vmax:      476.00 cm/s  Systemic VTI:  0.13 m MR Vmean:     330.0 cm/s   Systemic Diam: 1.90 cm MV E velocity: 79.30 cm/s MV A velocity: 77.60 cm/s MV E/A ratio:  1.02 Buford Dresser MD Electronically signed by Buford Dresser MD Signature  Date/Time: 06/08/2020/2:41:17 PM    Final     ASSESSMENT AND PLAN:  This is a very pleasant 65 years old white male with a stage IB non-small cell lung cancer, adenocarcinoma status post wedge resection of the left upper lobe followed by 4 cycles of adjuvant systemic chemotherapy with cisplatin and Alimta and he tolerated his treatment well except for fatigue. The patient has been on observation since June 2018.   The recent imaging studies including CT scan of the chest as well as a PET scan showed evidence for disease recurrence with metastatic disease presented with bilateral pulmonary nodules as well as destructive bone lesions at T4 vertebral body, biopsy proven to be metastatic adenocarcinoma. Molecular studies by foundation 1 showed no actionable mutations.  PDL 1 expression was negative. The patient is currently undergoing treatment with carboplatin, Alimta and Ketruda (pembrolizumab) status post 42 cycles.  Starting from cycle #5 he is on maintenance treatment with Alimta and Keytruda every 3 weeks. The patient continues to tolerate this treatment well with no concerning adverse effects except for mild fatigue. I recommended for him to proceed with cycle #43 today as planned but with only single agent Keytruda because of the renal insufficiency. I will see him back for follow-up visit in 3 weeks for evaluation before starting cycle #44. For the herpes zoster on the left lower abdomen and back, I will start the patient on Valtrex 500 mg p.o. twice daily for 7 days. I will also arrange for the patient to receive his Covid booster shot next week. The patient was advised to call immediately if he has any concerning symptoms in the interval. The patient voices understanding of current disease status and treatment options and is in agreement with the current care plan. All questions were answered. The patient knows to call the clinic with any problems, questions or concerns. We can certainly see  the patient much sooner if necessary. Disclaimer: This note was dictated with voice recognition software. Similar sounding words can inadvertently be transcribed and may be missed upon review. Eilleen Kempf, MD 06/09/20

## 2020-06-09 NOTE — Telephone Encounter (Signed)
Patient said he has shingles. He went to do Chemo today and was turned away from Chemo. He said the sores are not open and they don't itch. He has one spot on his abdomen and one spot on his back. Patient said he does not have any spots that would not be covered by clothing.   Patient is not sure what to do. He decided to cancel but was not sure what to do if this happen again. Please advise

## 2020-06-10 ENCOUNTER — Ambulatory Visit: Payer: Medicare Other | Admitting: Cardiology

## 2020-06-28 NOTE — Progress Notes (Deleted)
Cardiology Office Note:    Date:  06/28/2020   ID:  William Kales [name], DOB 08-Feb-1956, MRN [id number]  PCP:  [name], MD   [place]  Cardiologist:  Sherren Mocha, MD *** Advanced Practice Provider:  No care team member to display Electrophysiologist:  None    Referring MD: [name], MD     History of Present Illness:    [name]. is a 65 y.o. male with a hx of lung adenocarcinoma status post left lower lobectomy, then wedge resection of the left upper lobe with recurrence s/p chemo with metastatic disease, severe MR secondary to MVP deemed to not be a surgical management and currently pursuing medical management per structural heart team, and AAA s/p stent repair who was followed by Dr. Burt Knack and now presents to clinic for follow-up.  Patientwas first noted to have a heart murmur on physical exam when he was being worked up for primary lung cancer in November 2017 after CT imaging revealed suspicious mass in the left lung following stent graft repair of abdominal aortic aneurysm. Echocardiogram performed at that time revealed normal left ventricular function with prolapse involving the anterior leaflet of the mitral valve and what was felt to be moderate mitral regurgitation. The patient underwent uncomplicated left lower lobectomy with wedge resection of the left upper lobe by Dr. Servando Snare for what was pathologically stage IB (T2a, N0, M0)non-small cell adenocarcinoma of the lung. The patient has been followed closely ever since by Dr. Earlie Server. Follow-up CT and PET imaging performed in 2019 revealed disease recurrence with bilateral lung nodules and hypermetabolic destructive lesion in the T4 vertebral body consistent with bony involvement. The patient has remained on long-term chemotherapy ever since (carboplatin for AUC of 5, Alimta 500 mg/M2 and Keytruda 200 mg IV)and follow-up CT imaging has revealed no progression of disease. He then  developed progressive exertional shortness of breath. Follow-up transthoracic echocardiogram 07/2019 revealed normal left ventricular function with severe mitral regurgitation and signs of elevated right heart pressures. TEE October 07, 2019 confirmed the presence of severe mitral regurgitation. The jet of regurgitation was posterior with overriding prolapse of the anterior leaflet. There was reversal of flow in the pulmonary veins with PISAradius measured 1.0 cm, ERO0.45 cm, and regurgitant volume estimated 58 mL. He underwent cardiac surgical evaluation with Dr. Roxy Manns 01/07/2020 and then referred to Dr. Burt Knack for consideration of transcatheter edge-to-edge mitral valve repair as a less invasive treatment option in the context of his lung cancer. Cath without significant obstructive disease. He also was found to have very low filling pressures including low pulmonary capillary wedge pressure with a V wave of 7 mmHg.  After reviewing the pros and cons of transcatheter mitral valve repair, including potential risks and benefits, recommended continued  surveillance and medical therapy at this time.    He now returns to clinic for follow-up.   Past Medical History:  Diagnosis Date  . AAA (abdominal aortic aneurysm) ([place])   . AAA (abdominal aortic aneurysm) without rupture ([place]) 02/04/2014  . Cancer ([place])    lung  . Encounter for antineoplastic chemotherapy 06/06/2016  . History of hepatitis B   . HNP (herniated nucleus pulposus), lumbar    L4 with radiculopathy  . Hypertension   . Lung cancer ([place]) 05/18/2016  . Malignant neoplasm of lower lobe of left lung ([place]) 04/05/2016  . Mass of left lung   . Mitral insufficiency 04/06/2016   This patient will eventually need MV  repair if his prognosis is good from oncology standpoint. However, his lung cancer therapy  is the priority right now. His cardiac condition won't preclude possible lung surgery.  Once his lung cancer is under control he will need a TEE  to further evaluate his mitral valve anatomy and MR severity, and also have ischemic workup as tere is evidence on calcificati  . Renal insufficiency 10/09/2016  . S/P partial lobectomy of lung 05/11/2016  . Varicose veins of legs     Past Surgical History:  Procedure Laterality Date  . ABDOMINAL AORTIC ENDOVASCULAR STENT GRAFT N/A 03/12/2016   Procedure: ABDOMINAL AORTIC ENDOVASCULAR STENT GRAFT;  Surgeon: Conrad Hayes, MD;  Location: [facility];  Service: Vascular;  Laterality: N/A;  . COLONOSCOPY    . INGUINAL HERNIA REPAIR Left 1975   Left inguinal hernia  . INGUINAL HERNIA REPAIR Right   . IR IMAGING GUIDED PORT INSERTION  09/04/2019  . LOBECTOMY Left 05/11/2016   Procedure: LEFT LOWER LOBE LOBECTOMY AND LEFT UPPER LOBE  RESECTION AND PLACEMENT OF ON-Q;  Surgeon: [name], MD;  Location: [facility];  Service: Thoracic;  Laterality: Left;  . LUMBAR LAMINECTOMY  October 22, 2012  . LYMPH NODE DISSECTION Left 05/11/2016   Procedure: LYMPH NODE DISSECTION;  Surgeon: [name], MD;  Location: [facility];  Service: Thoracic;  Laterality: Left;  . RIGHT/LEFT HEART CATH AND CORONARY ANGIOGRAPHY N/A 03/10/2020   Procedure: RIGHT/LEFT HEART CATH AND CORONARY ANGIOGRAPHY;  Surgeon: Sherren Mocha, MD;  Location: [facility];  Service: Cardiovascular;  Laterality: N/A;  . STAPLING OF BLEBS Left 05/11/2016   Procedure: STAPLING OF APICAL BLEB;  Surgeon: [name], MD;  Location: [facility];  Service: Thoracic;  Laterality: Left;  . TEE WITHOUT CARDIOVERSION N/A 10/07/2019   Procedure: TRANSESOPHAGEAL ECHOCARDIOGRAM (TEE);  Surgeon: Dorothy Spark, MD;  Location: [facility];  Service: Cardiovascular;  Laterality: N/A;  . VIDEO ASSISTED THORACOSCOPY Left 05/18/2016   Procedure: VIDEO ASSISTED THORACOSCOPY WITH REMOVAL OF LEFT APICAL BLEB;  Surgeon: [name], MD;  Location: [facility];  Service: Thoracic;  Laterality: Left;  Marland Kitchen VIDEO ASSISTED THORACOSCOPY (VATS)/WEDGE RESECTION Left  05/11/2016   Procedure: LEFT VIDEO ASSISTED THORACOSCOPY (VATS);  Surgeon: [name], MD;  Location: [facility];  Service: Thoracic;  Laterality: Left;  Marland Kitchen VIDEO BRONCHOSCOPY N/A 05/11/2016   Procedure: VIDEO BRONCHOSCOPY, LEFT LUNG;  Surgeon: [name], MD;  Location: [facility];  Service: Thoracic;  Laterality: N/A;  . VIDEO BRONCHOSCOPY N/A 05/18/2016   Procedure: VIDEO BRONCHOSCOPY WITH BRONCHIAL WASHING;  Surgeon: [name], MD;  Location: [place];  Service: Thoracic;  Laterality: N/A;    Current Medications: No outpatient medications have been marked as taking for the 07/01/20 encounter (Appointment) with Freada Bergeron, MD.     Allergies:   Tramadol   Social History   Socioeconomic History  . Marital status: Married    Spouse name: Not on file  . Number of children: Not on file  . Years of education: Not on file  . Highest education level: Not on file  Occupational History  . Not on file  Tobacco Use  . Smoking status: Former Smoker    Packs/day: 1.00    Types: Cigarettes    Quit date: 03/05/2016    Years since quitting: 4.3  . Smokeless tobacco: Never Used  Vaping Use  . Vaping Use: Never used  Substance and Sexual Activity  . Alcohol use: Yes  Alcohol/week: 6.0 standard drinks    Types: 6 Cans of beer per week  . 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 Sexual activity: Not on file  Other Topics Concern  . Not on file  Social History Narrative  . Not on file   Social Determinants of Health   Financial Resource Strain: Not on file  Food Insecurity: Not on file  Transportation Needs: Not on file  Physical Activity: Not on file  Stress: Not on file  Social Connections: Not on file     Family History: The patient's ***family history includes Diabetes in his mother.  ROS:   Please see the history of present illness.    *** All other systems reviewed and are negative.  EKGs/Labs/Other Studies  Reviewed:    The following studies were reviewed today: Cardiac Cath: Conclusion  1. Patent coronary arteries with nonobstructive CAD, primarily affecting the mid-RCA. Widely patent LAD and LCx 2. Normal right heart pressures, including normal PCWP 3. Normal LVEDP  Recommend: further review with multidisciplinary heart team, considering transcatheter edge to edge mitral valve repair versus ongoing observation/med rx  Diagnostic Dominance: Co-dominant Left Main  Vessel was injected. Vessel is normal in caliber. Vessel is angiographically normal. Short left main, no stenosis  Left Anterior Descending  There is mild focal disease in the vessel. Large, wrap-around LAD with no significant stenosis  Mid LAD lesion is 25% stenosed.  Ramus Intermedius  Vessel is moderate in size. Vessel is angiographically normal.  Left Circumflex  The vessel exhibits minimal luminal irregularities.  Right Coronary Artery  Prox RCA to Mid RCA lesion is 50% stenosed. diffuse 50% mid-RCA stenosis  Intervention  No interventions have been documented. Right Heart  Right Heart Pressures LV EDP is normal.  Coronary Diagrams  Diagnostic Dominance: Co-dominant   Fick Cardiac Output 5.02 L/min  Fick Cardiac Output Index 2.55 (L/min)/BSA  Aortic Mean Gradient 3.17 mmHg  Aortic Peak Gradient 0 mmHg  Aortic Valve Area >3.50  Aortic Value Area Index 1.77 cm2/BSA  RA A Wave 6 mmHg  RA V Wave 6 mmHg  RA Mean 2 mmHg  RV Systolic Pressure 28 mmHg  RV Diastolic Pressure 3 mmHg  RV EDP 6 mmHg  PA Systolic Pressure 30 mmHg  PA Diastolic Pressure 6 mmHg  PA Mean 16 mmHg  PW A Wave 7 mmHg  PW V Wave 7 mmHg  PW Mean 6 mmHg  AO Systolic Pressure 381 mmHg  AO Diastolic Pressure 65 mmHg  AO Mean 84 mmHg  LV Systolic Pressure 017 mmHg  LV Diastolic Pressure 1 mmHg  LV EDP 9 mmHg  AOp Systolic Pressure 510 mmHg  AOp Diastolic Pressure 65 mmHg  AOp Mean Pressure 87 mmHg  LVp Systolic Pressure 258  mmHg  LVp Diastolic Pressure 3 mmHg  LVp EDP Pressure 8 mmHg  QP/QS 1  TPVR Index 6.29 HRUI  TSVR Index 33 HRUI  PVR SVR Ratio 0.12  TPVR/TSVR Ratio 0.19   TEE: 1. There is 4+ primary mitral regurgitation with myxomatous anterior  mitral valve leaflet, with prolapse of all three scallops. The jet is  posteriorly directed, eccentric with reversal of pulmonary vein forward  flow bilateraly. PISA radius 1.0 cm, ERO  0.45 cm2, regurgitation volume 58 ml. Posterior leaflet measures 1.0 cm.  Flail gap 0.3 cm.  2. Left ventricular ejection fraction, by estimation, is 60 to 65%. The  left ventricle has normal function.  The left ventricle has no regional  wall motion abnormalities.  3. Right ventricular systolic function is normal. The right ventricular  size is normal. There is normal pulmonary artery systolic pressure.  4. Left atrial size was moderately dilated. No left atrial/left atrial  appendage thrombus was detected. The LAA emptying velocity was 40 cm/s.  5. Right atrial size was mildly dilated.  6. The mitral valve is myxomatous. No evidence of mitral valve  regurgitation. No evidence of mitral stenosis.  7. Tricuspid valve regurgitation is moderate.  8. The aortic valve is normal in structure. Aortic valve regurgitation is  trivial. No aortic stenosis is present.  9. There is moderate dilatation at the level of the sinuses of Valsalva  measuring 48 mm. There is mild (Grade II) plaque.  10. The inferior vena cava is normal in size with greater than 50%  respiratory variability, suggesting right atrial pressure of 3 mmHg.   Echo 08/18/2019: IMPRESSIONS    1. Left ventricular ejection fraction, by estimation, is 60 to 65%. The  left ventricle has normal function. The left ventricle has no regional  wall motion abnormalities. There is mild left ventricular hypertrophy.  Left ventricular diastolic parameters  are consistent with Grade I diastolic dysfunction (impaired  relaxation).  2. Right ventricular systolic function is normal. The right ventricular  size is normal. There is severely elevated pulmonary artery systolic  pressure. The estimated right ventricular systolic pressure is 18.8 mmHg.  3. Left atrial size was moderately dilated.  4. The mitral valve is myxomatous. Severe mitral valve regurgitation.  5. The aortic valve is tricuspid. Aortic valve regurgitation is not  visualized.  6. Aortic dilatation noted. Aneurysm of the ascending aorta, measuring 40  mm. There is moderate to severe dilatation at the level of the sinuses of  Valsalva measuring 49 mm.  7. The inferior vena cava is normal in size with <50% respiratory  variability, suggesting right atrial pressure of 8 mmHg.   Comparison(s): 06/25/18 EF 60-65%. Severe MR. RVSP 29 mmHg. Sinus of Valsava  measured 3.9 cm on the last study, but now measures 4.9 cm. This  previously measured 4.8 cm in 2019. The ascending aorta is stable.     EKG:  EKG is *** ordered today.  The ekg ordered today demonstrates ***  Recent Labs: 06/09/2020: ALT 20; BUN 16; Creatinine 1.75; Hemoglobin 11.9; Platelet Count 491; Potassium 4.1; Sodium 131; TSH 1.944  Recent Lipid Panel No results found for: CHOL, TRIG, HDL, CHOLHDL, VLDL, LDLCALC, LDLDIRECT   Risk Assessment/Calculations:   {Does this patient have ATRIAL FIBRILLATION?:[phone]}   Physical Exam:    VS:  There were no vitals taken for this visit.    Wt Readings from Last 3 Encounters:  06/09/20 148 lb 14.4 oz (67.5 kg)  05/19/20 151 lb 14.4 oz (68.9 kg)  04/28/20 147 lb 12.8 oz (67 kg)     GEN: *** Well nourished, well developed in no acute distress HEENT: Normal NECK: No JVD; No carotid bruits LYMPHATICS: No lymphadenopathy CARDIAC: ***RRR, no murmurs, rubs, gallops RESPIRATORY:  Clear to auscultation without rales, wheezing or rhonchi  ABDOMEN: Soft, non-tender, non-distended MUSCULOSKELETAL:  No edema; No deformity  SKIN:  Warm and dry NEUROLOGIC:  Alert and oriented x 3 PSYCHIATRIC:  Normal affect   ASSESSMENT:    No diagnosis found. PLAN:    In order of problems listed above:  #Severe MR secondary to mitral valve prolapse: PISAradius measured 1.0 cm, ERO0.45 cm. Cath with low filling pressures with  V wave of 11mmHg, PAP 59mmHg, no afib. Has been too high risk by CV surgery. Evaluated by Dr. Burt Knack and given low filling pressure, preserved LVEF, no pulmonary HTN and absence of Afib, planning to continue medical management at this time. -Continue coreg 6.25mg  BID -Continue lisinopril 2.5mg  daily   #Ascending aortic aneurysm: Followed by Dr. Servando Snare, -CT chest in 01/16/20 with stable aneurysm measuring 4cm in diameter  #HTN: -Continue coreg 6.25mg  BID -Continue lisinopril 2.5mg  daily  #Metastatic lung adenocarcinoma: -Followed by Dr. Birdie Hopes you ordering a CV Procedure (e.g. stress test, cath, DCCV, TEE, etc)?   Press F2        :[id number]}    Medication Adjustments/Labs and Tests Ordered: Current medicines are reviewed at length with the patient today.  Concerns regarding medicines are outlined above.  No orders of the defined types were placed in this encounter.  No orders of the defined types were placed in this encounter.   There are no Patient Instructions on file for this visit.   Signed, Freada Bergeron, MD  06/28/2020 3:06 PM    [facility]

## 2020-06-30 ENCOUNTER — Inpatient Hospital Stay: Payer: Federal, State, Local not specified - PPO

## 2020-06-30 ENCOUNTER — Inpatient Hospital Stay (HOSPITAL_BASED_OUTPATIENT_CLINIC_OR_DEPARTMENT_OTHER): Payer: Federal, State, Local not specified - PPO

## 2020-06-30 ENCOUNTER — Inpatient Hospital Stay: Payer: Federal, State, Local not specified - PPO | Attending: Internal Medicine

## 2020-06-30 ENCOUNTER — Inpatient Hospital Stay (HOSPITAL_BASED_OUTPATIENT_CLINIC_OR_DEPARTMENT_OTHER): Payer: Federal, State, Local not specified - PPO | Admitting: Internal Medicine

## 2020-06-30 ENCOUNTER — Encounter: Payer: Self-pay | Admitting: Internal Medicine

## 2020-06-30 ENCOUNTER — Other Ambulatory Visit: Payer: Self-pay

## 2020-06-30 VITALS — BP 115/82 | HR 88 | Temp 96.3°F | Resp 14 | Ht 75.5 in | Wt 152.5 lb

## 2020-06-30 DIAGNOSIS — C7951 Secondary malignant neoplasm of bone: Secondary | ICD-10-CM | POA: Diagnosis not present

## 2020-06-30 DIAGNOSIS — Z5112 Encounter for antineoplastic immunotherapy: Secondary | ICD-10-CM | POA: Diagnosis not present

## 2020-06-30 DIAGNOSIS — C3492 Malignant neoplasm of unspecified part of left bronchus or lung: Secondary | ICD-10-CM

## 2020-06-30 DIAGNOSIS — C3490 Malignant neoplasm of unspecified part of unspecified bronchus or lung: Secondary | ICD-10-CM | POA: Diagnosis not present

## 2020-06-30 DIAGNOSIS — Z79899 Other long term (current) drug therapy: Secondary | ICD-10-CM | POA: Diagnosis not present

## 2020-06-30 DIAGNOSIS — Z23 Encounter for immunization: Secondary | ICD-10-CM | POA: Insufficient documentation

## 2020-06-30 DIAGNOSIS — C3412 Malignant neoplasm of upper lobe, left bronchus or lung: Secondary | ICD-10-CM | POA: Insufficient documentation

## 2020-06-30 DIAGNOSIS — C3432 Malignant neoplasm of lower lobe, left bronchus or lung: Secondary | ICD-10-CM

## 2020-06-30 DIAGNOSIS — Z95828 Presence of other vascular implants and grafts: Secondary | ICD-10-CM

## 2020-06-30 LAB — TSH: TSH: 2.203 u[IU]/mL (ref 0.320–4.118)

## 2020-06-30 LAB — CMP (CANCER CENTER ONLY)
ALT: 11 U/L (ref 0–44)
AST: 25 U/L (ref 15–41)
Albumin: 3 g/dL — ABNORMAL LOW (ref 3.5–5.0)
Alkaline Phosphatase: 92 U/L (ref 38–126)
Anion gap: 5 (ref 5–15)
BUN: 17 mg/dL (ref 8–23)
CO2: 20 mmol/L — ABNORMAL LOW (ref 22–32)
Calcium: 8.7 mg/dL — ABNORMAL LOW (ref 8.9–10.3)
Chloride: 106 mmol/L (ref 98–111)
Creatinine: 1.64 mg/dL — ABNORMAL HIGH (ref 0.61–1.24)
GFR, Estimated: 46 mL/min — ABNORMAL LOW (ref >60)
Glucose, Bld: 98 mg/dL (ref 70–99)
Potassium: 4.4 mmol/L (ref 3.5–5.1)
Sodium: 131 mmol/L — ABNORMAL LOW (ref 135–145)
Total Bilirubin: 0.4 mg/dL (ref 0.3–1.2)
Total Protein: 6.9 g/dL (ref 6.5–8.1)

## 2020-06-30 LAB — CBC WITH DIFFERENTIAL (CANCER CENTER ONLY)
Abs Immature Granulocytes: 0.03 10*3/uL (ref 0.00–0.07)
Basophils Absolute: 0.1 10*3/uL (ref 0.0–0.1)
Basophils Relative: 1 %
Eosinophils Absolute: 0.5 10*3/uL (ref 0.0–0.5)
Eosinophils Relative: 5 %
HCT: 39.3 % (ref 39.0–52.0)
Hemoglobin: 13.1 g/dL (ref 13.0–17.0)
Immature Granulocytes: 0 %
Lymphocytes Relative: 20 %
Lymphs Abs: 2 10*3/uL (ref 0.7–4.0)
MCH: 33.7 pg (ref 26.0–34.0)
MCHC: 33.3 g/dL (ref 30.0–36.0)
MCV: 101 fL — ABNORMAL HIGH (ref 80.0–100.0)
Monocytes Absolute: 1.4 10*3/uL — ABNORMAL HIGH (ref 0.1–1.0)
Monocytes Relative: 14 %
Neutro Abs: 6 10*3/uL (ref 1.7–7.7)
Neutrophils Relative %: 60 %
Platelet Count: 313 10*3/uL (ref 150–400)
RBC: 3.89 MIL/uL — ABNORMAL LOW (ref 4.22–5.81)
RDW: 14.1 % (ref 11.5–15.5)
WBC Count: 9.9 10*3/uL (ref 4.0–10.5)
nRBC: 0 % (ref 0.0–0.2)

## 2020-06-30 MED ORDER — CYANOCOBALAMIN 1000 MCG/ML IJ SOLN
INTRAMUSCULAR | Status: AC
  Filled 2020-06-30: qty 1

## 2020-06-30 MED ORDER — SODIUM CHLORIDE 0.9 % IV SOLN
Freq: Once | INTRAVENOUS | Status: AC
  Filled 2020-06-30: qty 250

## 2020-06-30 MED ORDER — SODIUM CHLORIDE 0.9 % IV SOLN
200.0000 mg | Freq: Once | INTRAVENOUS | Status: AC
  Administered 2020-06-30: 200 mg via INTRAVENOUS
  Filled 2020-06-30: qty 8

## 2020-06-30 MED ORDER — CYANOCOBALAMIN 1000 MCG/ML IJ SOLN
1000.0000 ug | Freq: Once | INTRAMUSCULAR | Status: AC
  Administered 2020-06-30: 1000 ug via INTRAMUSCULAR

## 2020-06-30 MED ORDER — SODIUM CHLORIDE 0.9% FLUSH
10.0000 mL | Freq: Once | INTRAVENOUS | Status: AC
Start: 2020-06-30 — End: 2020-06-30
  Administered 2020-06-30: 10 mL
  Filled 2020-06-30: qty 10

## 2020-06-30 MED ORDER — HEPARIN SOD (PORK) LOCK FLUSH 100 UNIT/ML IV SOLN
500.0000 [IU] | Freq: Once | INTRAVENOUS | Status: AC | PRN
  Administered 2020-06-30: 500 [IU]
  Filled 2020-06-30: qty 5

## 2020-06-30 MED ORDER — CYANOCOBALAMIN 1000 MCG/ML IJ SOLN: 1000.0000 ug | Freq: Once | INTRAMUSCULAR | Status: DC

## 2020-06-30 MED ORDER — SODIUM CHLORIDE 0.9% FLUSH
10.0000 mL | INTRAVENOUS | Status: DC | PRN
  Administered 2020-06-30: 10 mL
  Filled 2020-06-30: qty 10

## 2020-06-30 NOTE — Progress Notes (Signed)
Per Dr. Julien Nordmann, proceed with single agent pembrolizumab + B12 for today's treatment d/t elevated serum creatinine. Hold ondansetron and dexamethasone.

## 2020-06-30 NOTE — Progress Notes (Signed)
[place] [phone]   [phone]  OFFICE PROGRESS NOTE  [name], MD [address] [place] [address]  DIAGNOSIS: Metastatic non-small cell lung cancer, adenocarcinoma initially diagnosed as stage IB (T2a, N0, M0) non-small cell lung cancer, adenocarcinoma diagnosed in December 2017.  The patient has evidence for disease recurrence in July 2019.  Biomarker Findings Tumor Mutational Burden - TMB-Intermediate (16 Muts/Mb) Microsatellite status - MS-Stable Genomic Findings For a complete list of the genes assayed, please refer to the Appendix. KIT amplification KRAS N56O SMAD4 splice site 1308-6V>H QI69 I232F 7 Disease relevant genes with no reportable alterations: EGFR, ALK, BRAF, MET, RET, ERBB2, ROS1   PDL 1 expression is 0%  PRIOR THERAPY:  1) status post left lower lobectomy as well as wedge resection of the left upper lobe on 05/11/2016. 2) Adjuvant systemic chemotherapy with cisplatin 75 MG/M2 and Alimta 500 MG/M2 every 3 weeks. First dose 07/03/2016. Status post 4 cycles.  CURRENT THERAPY: Systemic chemotherapy with carboplatin for AUC of 5, Alimta 500 mg/M2 and Keytruda 200 mg IV every 3 weeks.  Status post 42 cycles.  Starting from cycle #5 the patient will be treated with maintenance Alimta and Ketruda (pembrolizumab) every 3 weeks.  INTERVAL HISTORY: William Kales [name] 65 y.o. male returns to the clinic today for follow-up visit.  The patient is feeling fine today with no concerning complaints.  He was diagnosed with herpes zoster involving the skin of the anterior left lower abdomen and back.  He was treated with Valtrex for 7 days and the zoster is significantly improved.  He denied having any current chest pain, shortness of breath, cough or hemoptysis.  He denied having any fever or chills.  He has no nausea, vomiting, diarrhea or constipation.  He has no headache or visual changes.  He is here today to resume  his treatment.   MEDICAL HISTORY: Past Medical History:  Diagnosis Date  . AAA (abdominal aortic aneurysm) ([facility])   . AAA (abdominal aortic aneurysm) without rupture ([facility]) 02/04/2014  . Cancer ([place])    lung  . Encounter for antineoplastic chemotherapy 06/06/2016  . History of hepatitis B   . HNP (herniated nucleus pulposus), lumbar    L4 with radiculopathy  . Hypertension   . Lung cancer ([facility]) 05/18/2016  . Malignant neoplasm of lower lobe of left lung ([facility]) 04/05/2016  . Mass of left lung   . Mitral insufficiency 04/06/2016   This patient will eventually need MV repair if his prognosis is good from oncology standpoint. However, his lung cancer therapy  is the priority right now. His cardiac condition won't preclude possible lung surgery.  Once his lung cancer is under control he will need a TEE to further evaluate his mitral valve anatomy and MR severity, and also have ischemic workup as tere is evidence on calcificati  . Renal insufficiency 10/09/2016  . S/P partial lobectomy of lung 05/11/2016  . Varicose veins of legs     ALLERGIES:  is allergic to tramadol.  MEDICATIONS:  Current Outpatient Medications  Medication Sig Dispense Refill  . carvedilol (COREG) 6.25 MG tablet Take 1 tablet (6.25 mg total) by mouth 2 (two) times daily. 629 tablet 3  . folic acid (FOLVITE) 1 MG tablet Take 1 tablet (1 mg total) by mouth daily. 90 tablet 1  . lisinopril (ZESTRIL) 2.5 MG tablet TAKE 1 TABLET BY MOUTH EVERY DAY 90 tablet 3  .  valACYclovir (VALTREX) 500 MG tablet Take 1 tablet (500 mg total) by mouth 2 (two) times daily. 14 tablet 0   No current facility-administered medications for this visit.    SURGICAL HISTORY:  Past Surgical History:  Procedure Laterality Date  . ABDOMINAL AORTIC ENDOVASCULAR STENT GRAFT N/A 03/12/2016   Procedure: ABDOMINAL AORTIC ENDOVASCULAR STENT GRAFT;  Surgeon: Conrad Valparaiso, MD;  Location: [facility];  Service: Vascular;  Laterality: N/A;  . COLONOSCOPY    .  INGUINAL HERNIA REPAIR Left 1975   Left inguinal hernia  . INGUINAL HERNIA REPAIR Right   . IR IMAGING GUIDED PORT INSERTION  09/04/2019  . LOBECTOMY Left 05/11/2016   Procedure: LEFT LOWER LOBE LOBECTOMY AND LEFT UPPER LOBE  RESECTION AND PLACEMENT OF ON-Q;  Surgeon: [name], MD;  Location: [facility];  Service: Thoracic;  Laterality: Left;  . LUMBAR LAMINECTOMY  October 22, 2012  . LYMPH NODE DISSECTION Left 05/11/2016   Procedure: LYMPH NODE DISSECTION;  Surgeon: [name], MD;  Location: [facility];  Service: Thoracic;  Laterality: Left;  . RIGHT/LEFT HEART CATH AND CORONARY ANGIOGRAPHY N/A 03/10/2020   Procedure: RIGHT/LEFT HEART CATH AND CORONARY ANGIOGRAPHY;  Surgeon: Sherren Mocha, MD;  Location: [facility];  Service: Cardiovascular;  Laterality: N/A;  . STAPLING OF BLEBS Left 05/11/2016   Procedure: STAPLING OF APICAL BLEB;  Surgeon: [name], MD;  Location: [facility];  Service: Thoracic;  Laterality: Left;  . TEE WITHOUT CARDIOVERSION N/A 10/07/2019   Procedure: TRANSESOPHAGEAL ECHOCARDIOGRAM (TEE);  Surgeon: Dorothy Spark, MD;  Location: [facility] ENDOSCOPY;  Service: Cardiovascular;  Laterality: N/A;  . VIDEO ASSISTED THORACOSCOPY Left 05/18/2016   Procedure: VIDEO ASSISTED THORACOSCOPY WITH REMOVAL OF LEFT APICAL BLEB;  Surgeon: [name], MD;  Location: [facility];  Service: Thoracic;  Laterality: Left;  Marland Kitchen VIDEO ASSISTED THORACOSCOPY (VATS)/WEDGE RESECTION Left 05/11/2016   Procedure: LEFT VIDEO ASSISTED THORACOSCOPY (VATS);  Surgeon: [name], MD;  Location: [facility];  Service: Thoracic;  Laterality: Left;  Marland Kitchen VIDEO BRONCHOSCOPY N/A 05/11/2016   Procedure: VIDEO BRONCHOSCOPY, LEFT LUNG;  Surgeon: [name], MD;  Location: [facility];  Service: Thoracic;  Laterality: N/A;  . VIDEO BRONCHOSCOPY N/A 05/18/2016   Procedure: VIDEO BRONCHOSCOPY WITH BRONCHIAL WASHING;  Surgeon: [name], MD;  Location: [facility];  Service: Thoracic;  Laterality: N/A;     REVIEW OF SYSTEMS:  A comprehensive review of systems was negative.   PHYSICAL EXAMINATION: General appearance: alert, cooperative and no distress Head: Normocephalic, without obvious abnormality, atraumatic Neck: no adenopathy, no JVD, supple, symmetrical, trachea midline and thyroid not enlarged, symmetric, no tenderness/mass/nodules Lymph nodes: Cervical, supraclavicular, and axillary nodes normal. Resp: clear to auscultation bilaterally Back: symmetric, no curvature. ROM normal. No CVA tenderness. Cardio: regular rate and rhythm, S1, S2 normal, no murmur, click, rub or gallop GI: soft, non-tender; bowel sounds normal; no masses,  no organomegaly Extremities: extremities normal, atraumatic, no cyanosis or edema   ECOG PERFORMANCE STATUS: 1 - Symptomatic but completely ambulatory  Blood pressure 115/82, pulse 88, temperature (!) 96.3 F (35.7 C), temperature source Tympanic, resp. rate 14, height 6' 3.5" (1.918 m), weight 152 lb 8 oz (69.2 kg), SpO2 96 %.  LABORATORY DATA: Lab Results  Component Value Date   WBC 9.9 06/30/2020   HGB 13.1 06/30/2020   HCT 39.3 06/30/2020   MCV 101.0 (H) 06/30/2020   PLT 313 06/30/2020      Chemistry      Component  Value Date/Time   NA 131 (L) 06/09/2020 0900   NA 132 (L) 10/05/2019 0830   NA 138 01/11/2017 1343   K 4.1 06/09/2020 0900   K 4.7 01/11/2017 1343   CL 103 06/09/2020 0900   CO2 21 (L) 06/09/2020 0900   CO2 23 01/11/2017 1343   BUN 16 06/09/2020 0900   BUN 14 10/05/2019 0830   BUN 20.2 01/11/2017 1343   CREATININE 1.75 (H) 06/09/2020 0900   CREATININE 1.6 (H) 01/11/2017 1343      Component Value Date/Time   CALCIUM 8.5 (L) 06/09/2020 0900   CALCIUM 9.3 01/11/2017 1343   ALKPHOS 85 06/09/2020 0900   ALKPHOS 89 01/11/2017 1343   AST 37 06/09/2020 0900   AST 41 (H) 01/11/2017 1343   ALT 20 06/09/2020 0900   ALT 27 01/11/2017 1343   BILITOT 0.4 06/09/2020 0900   BILITOT 0.49 01/11/2017 1343       RADIOGRAPHIC  STUDIES: ECHOCARDIOGRAM COMPLETE  Result Date: 06/08/2020    ECHOCARDIOGRAM REPORT   Patient Name:   William MARROW [name] Date of Exam: 06/08/2020 Medical Rec #:  [id number]         Height:       75.5 in Accession #:    [id number]        Weight:       151.9 lb Date of Birth:  October 09, 1955         BSA:          1.962 m Patient Age:    62 years          BP:           91/69 mmHg Patient Gender: M                 HR:           80 bpm. Exam Location:  [facility] Procedure: 2D Echo, Cardiac Doppler and Color Doppler Indications:    I34.0 Mitral valve insufficiency  History:        Patient has prior history of Echocardiogram examinations.                 History of lung CA, status post left lower lobectomy, then wedge                 resection of the left upper lobe. 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 On long term                 chemotherapy. AAA. 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  Sonographer:    Marygrace Drought RCS Referring Phys: [place]  1. Left ventricular ejection fraction, by estimation, is 60 to 65%. The left ventricle has normal function. The left ventricle has no regional wall motion abnormalities. There is mild left ventricular hypertrophy. Left ventricular diastolic parameters are consistent with Grade II diastolic dysfunction (pseudonormalization).  2. Right ventricular systolic function is normal. The right ventricular size is normal. There is normal  pulmonary artery systolic pressure.  3. Left atrial size was moderately dilated.  4. The mitral valve is myxomatous. Severe mitral valve regurgitation.  5. 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  6. The aortic valve is tricuspid. Aortic  valve regurgitation is not visualized. No aortic stenosis is present.  7. Aortic dilatation noted. There is moderate dilatation at the level of the sinuses of Valsalva, measuring 48 mm.  8. The inferior vena cava is normal in size with greater than 50% respiratory variability, suggesting right atrial pressure of 3 mmHg. Comparison(s): No significant change from prior study. 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 FINDINGS  Left Ventricle: Left ventricular ejection fraction, by estimation, is 60 to 65%. The left ventricle has normal function. The left ventricle has no regional wall motion abnormalities. The left ventricular internal cavity size was normal in size. There is  mild left ventricular hypertrophy. Left ventricular diastolic parameters are consistent with Grade II diastolic dysfunction (pseudonormalization). Right Ventricle: The right ventricular size is normal. Right vetricular wall thickness was not well visualized. Right ventricular systolic function is normal. There is normal pulmonary artery systolic pressure. The tricuspid regurgitant velocity is 2.59 m/s, and with an assumed right atrial pressure of 3 mmHg, the estimated right ventricular systolic pressure is 85.4 mmHg. Left Atrium: Left atrial size was moderately dilated. Right Atrium: Right atrial size was normal in size. Pericardium: Trivial pericardial effusion is present. Mitral Valve: The mitral valve is myxomatous. Severe mitral valve regurgitation. Tricuspid Valve: 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 Aortic Valve: The aortic valve is tricuspid. Aortic valve regurgitation is not visualized. No aortic stenosis is present. Pulmonic Valve: The pulmonic valve was grossly normal. Pulmonic valve regurgitation is trivial. No evidence of pulmonic stenosis. Aorta:  Aortic dilatation noted. There is moderate dilatation at the level of the sinuses of Valsalva, measuring 48 mm. Venous: The inferior vena cava is normal in size with greater than 50% respiratory variability, suggesting right atrial pressure of 3 mmHg. IAS/Shunts: The atrial septum is grossly normal.  LEFT VENTRICLE PLAX 2D LVIDd:         4.50 cm  Diastology LVIDs:         3.10 cm  LV e' medial:    5.44 cm/s LV PW:         1.20 cm  LV E/e' medial:  14.6 LV IVS:        1.40 cm  LV e' lateral:   12.10 cm/s LVOT diam:     1.90 cm  LV E/e' lateral: 6.6 LV SV:         36 LV SV Index:   18 LVOT Area:     2.84 cm  RIGHT VENTRICLE RV Basal diam:  3.00 cm RV S prime:     13.40 cm/s TAPSE (M-mode): 2.1 cm LEFT ATRIUM             Index       RIGHT ATRIUM           Index LA diam:        3.60 cm 1.83 cm/m  RA Area:     12.50 cm LA Vol (A2C):   78.4 ml 39.96 ml/m RA Volume:   33.30 ml  16.97 ml/m LA Vol (A4C):   44.8 ml 22.81 ml/m LA Biplane Vol: 70.8 ml 36.09 ml/m  AORTIC VALVE LVOT Vmax:  102.00 cm/s LVOT Vmean:  62.900 cm/s LVOT VTI:    0.126 m  AORTA Ao Root diam: 4.80 cm Ao Asc diam:  4.10 cm MITRAL VALVE               TRICUSPID VALVE MV Area (PHT): 4.57 cm    TR Peak grad:   26.8 mmHg MV Decel Time: 166 msec    TR Vmax:        259.00 cm/s MR Peak grad: 90.6 mmHg MR Mean grad: 51.0 mmHg    SHUNTS MR Vmax:      476.00 cm/s  Systemic VTI:  0.13 m MR Vmean:     330.0 cm/s   Systemic Diam: 1.90 cm MV E velocity: 79.30 cm/s MV A velocity: 77.60 cm/s MV E/A ratio:  1.02 Buford Dresser MD Electronically signed by Buford Dresser MD Signature Date/Time: 06/08/2020/2:41:17 PM    Final     ASSESSMENT AND PLAN:  This is a very pleasant 65 years old white male with a stage IB non-small cell lung cancer, adenocarcinoma status post wedge resection of the left upper lobe followed by 4 cycles of adjuvant systemic chemotherapy with cisplatin and Alimta and he tolerated his treatment well except for fatigue. The  patient has been on observation since June 2018.   The recent imaging studies including CT scan of the chest as well as a PET scan showed evidence for disease recurrence with metastatic disease presented with bilateral pulmonary nodules as well as destructive bone lesions at T4 vertebral body, biopsy proven to be metastatic adenocarcinoma. Molecular studies by foundation 1 showed no actionable mutations.  PDL 1 expression was negative. The patient is currently undergoing treatment with carboplatin, Alimta and Ketruda (pembrolizumab) status post 42 cycles.  Starting from cycle #5 he is on maintenance treatment with Alimta and Keytruda every 3 weeks. He has been tolerating the treatment well but recently has serum creatinine has been elevated. I recommended for the patient to proceed with cycle #43 today with Keytruda but I would consider adding Alimta if his kidney function is better. I will see him back for follow-up visit in 3 weeks for evaluation with repeat CT scan of the chest, abdomen pelvis for restaging of his disease. He was advised to call immediately if he has any concerning symptoms in the interval. The patient voices understanding of current disease status and treatment options and is in agreement with the current care plan. All questions were answered. The patient knows to call the clinic with any problems, questions or concerns. We can certainly see the patient much sooner if necessary. Disclaimer: This note was dictated with voice recognition software. Similar sounding words can inadvertently be transcribed and may be missed upon review. Eilleen Kempf, MD 06/30/20

## 2020-06-30 NOTE — Patient Instructions (Signed)
[facility] Discharge Instructions for Patients Receiving Chemotherapy  Today you received the following chemotherapy agents: pembrolizumab.  To help prevent nausea and vomiting after your treatment, we encourage you to take your nausea medication as directed.   If you develop nausea and vomiting that is not controlled by your nausea medication, call the clinic.   BELOW ARE SYMPTOMS THAT SHOULD BE REPORTED IMMEDIATELY:  *FEVER GREATER THAN 100.5 F  *CHILLS WITH OR WITHOUT FEVER  NAUSEA AND VOMITING THAT IS NOT CONTROLLED WITH YOUR NAUSEA MEDICATION  *UNUSUAL SHORTNESS OF BREATH  *UNUSUAL BRUISING OR BLEEDING  TENDERNESS IN MOUTH AND THROAT WITH OR WITHOUT PRESENCE OF ULCERS  *URINARY PROBLEMS  *BOWEL PROBLEMS  UNUSUAL RASH Items with * indicate a potential emergency and should be followed up as soon as possible.  Feel free to call the clinic should you have any questions or concerns. The clinic phone number is [phone].  Please show the CHEMO ALERT CARD at check-in to the Emergency Department and triage nurse.   

## 2020-06-30 NOTE — Progress Notes (Signed)
   [id number]  Name:  AMAAD BYERS [name]    MRN: [id number] DOB: 04-03-1956  06/30/2020  Mr. Ferdig was observed post Covid-19 immunization for 15 minutes without incident. He was provided with Vaccine Information Sheet and instruction to access the V-Safe system.   Mr. Bachtel was instructed to call 911 with any severe reactions post vaccine: Marland Kitchen Difficulty breathing  . Swelling of face and throat  . A fast heartbeat  . A bad rash all over body  . Dizziness and weakness   Immunizations Administered    Name Date Dose VIS Date Route   PFIZER Comrnaty(Gray TOP) Covid-19 Vaccine 06/30/2020 10:09 AM 0.3 mL 03/31/2020 Intramuscular   Manufacturer: [employer]   Lot: [id number]   NDC: [phone]

## 2020-06-30 NOTE — Patient Instructions (Signed)
Implanted Port Insertion, Care After This sheet gives you information about how to care for yourself after your procedure. Your health care provider may also give you more specific instructions. If you have problems or questions, contact your health care provider. What can I expect after the procedure? After the procedure, it is common to have:  Discomfort at the port insertion site.  Bruising on the skin over the port. This should improve over 3-4 days. Follow these instructions at home: Port care  After your port is placed, you will get a manufacturer's information card. The card has information about your port. Keep this card with you at all times.  Take care of the port as told by your health care provider. Ask your health care provider if you or a family member can get training for taking care of the port at home. A home health care nurse may also take care of the port.  Make sure to remember what type of port you have. Incision care  Follow instructions from your health care provider about how to take care of your port insertion site. Make sure you: ? Wash your hands with soap and water before and after you change your bandage (dressing). If soap and water are not available, use hand sanitizer. ? Change your dressing as told by your health care provider. ? Leave stitches (sutures), skin glue, or adhesive strips in place. These skin closures may need to stay in place for 2 weeks or longer. If adhesive strip edges start to loosen and curl up, you may trim the loose edges. Do not remove adhesive strips completely unless your health care provider tells you to do that.  Check your port insertion site every day for signs of infection. Check for: ? Redness, swelling, or pain. ? Fluid or blood. ? Warmth. ? Pus or a bad smell.      Activity  Return to your normal activities as told by your health care provider. Ask your health care provider what activities are safe for you.  Do not  lift anything that is heavier than 10 lb (4.5 kg), or the limit that you are told, until your health care provider says that it is safe. General instructions  Take over-the-counter and prescription medicines only as told by your health care provider.  Do not take baths, swim, or use a hot tub until your health care provider approves. Ask your health care provider if you may take showers. You may only be allowed to take sponge baths.  Do not drive for 24 hours if you were given a sedative during your procedure.  Wear a medical alert bracelet in case of an emergency. This will tell any health care providers that you have a port.  Keep all follow-up visits as told by your health care provider. This is important. Contact a health care provider if:  You cannot flush your port with saline as directed, or you cannot draw blood from the port.  You have a fever or chills.  You have redness, swelling, or pain around your port insertion site.  You have fluid or blood coming from your port insertion site.  Your port insertion site feels warm to the touch.  You have pus or a bad smell coming from the port insertion site. Get help right away if:  You have chest pain or shortness of breath.  You have bleeding from your port that you cannot control. Summary  Take care of the port as told by your   health care provider. Keep the manufacturer's information card with you at all times.  Change your dressing as told by your health care provider.  Contact a health care provider if you have a fever or chills or if you have redness, swelling, or pain around your port insertion site.  Keep all follow-up visits as told by your health care provider. This information is not intended to replace advice given to you by your health care provider. Make sure you discuss any questions you have with your health care provider. Document Revised: 11/05/2017 Document Reviewed: 11/05/2017 Elsevier Patient Education   2021 [employer]  

## 2020-07-01 ENCOUNTER — Ambulatory Visit: Payer: Medicare Other | Admitting: Cardiology

## 2020-07-11 ENCOUNTER — Ambulatory Visit (INDEPENDENT_AMBULATORY_CARE_PROVIDER_SITE_OTHER): Payer: Federal, State, Local not specified - PPO | Admitting: Cardiology

## 2020-07-11 ENCOUNTER — Encounter: Payer: Self-pay | Admitting: Cardiology

## 2020-07-11 ENCOUNTER — Other Ambulatory Visit: Payer: Self-pay

## 2020-07-11 VITALS — BP 122/78 | HR 75 | Ht 75.5 in | Wt 150.0 lb

## 2020-07-11 DIAGNOSIS — I7121 Aneurysm of the ascending aorta, without rupture: Secondary | ICD-10-CM

## 2020-07-11 DIAGNOSIS — I340 Nonrheumatic mitral (valve) insufficiency: Secondary | ICD-10-CM

## 2020-07-11 DIAGNOSIS — I10 Essential (primary) hypertension: Secondary | ICD-10-CM

## 2020-07-11 DIAGNOSIS — I712 Thoracic aortic aneurysm, without rupture: Secondary | ICD-10-CM

## 2020-07-11 NOTE — Progress Notes (Signed)
Cardiology Office Note    Date:  07/11/2020   ID:  William Kales [name], DOB 1956-01-21, MRN [id number]  PCP:  William Lei, MD  Cardiologist:  William Dawley, MD   Reason for visit: 3 months follow-up for severe mitral regurgitation  History of Present Illness:  [name]. is a 65 y.o. male, very pleasant patient with a history of of AAA s/p stent graft in 02/2016, 40 PPY history of tobacco abuse, severe mitral regurgitation was diagnosed with a left lung mass on PFT and PET scan. 8 x 3.2 cm superior segment left lower lobe lung mass is hypermetabolic and consistent with primary lung neoplasm in December 2017. No mediastinal or hilar lymphadenopathy and no findings to suggest metastatic disease. 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 The imaging studies in 2019 including CT scan of the chest as well as a PET scan showed evidence for disease recurrence with metastatic disease presented with bilateral pulmonary nodules as well as destructive bone lesions at T4 vertebral body, biopsy proven to be metastatic adenocarcinoma. Molecular studies by foundation 1 showed no actionable mutations.  PDL 1 expression was negative. The patient is currently undergoing treatment with carboplatin, Alimta and Ketruda (pembrolizumab) status post 26 cycles.  Starting from cycle #5 he is on maintenance treatment with Alimta and Keytruda every 3 weeks.  Per Dr. Earlie Server his lesions are shrinking, he is last scan in July 2021 showed stable finding.  I have discussed his case with Dr Earlie Server and he believes that we should proceed with plan mitral valve repair as if the patient did not have cancer..  He has repeat scans every 3 months.  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  He was referred for TEE did confirm severe mitral regurgitation and was seen by Dr. Burt Knack who performed a right and left-sided cath that showed low LVEDP, nonobstructive CAD and normal right-sided pressures.  Because patient symptoms improved in the meantime it was decided that we will continue surveillance of his symptoms and follow-up echocardiograms. Today he states that he is now working at the [employer], he denies any lower extremity edema orthopnea proximal nocturnal dyspnea.  He has had no palpitations no dizziness or falls.  He overall feels well and is functional class IIb.   Past Medical History:  Diagnosis Date  . AAA (abdominal aortic aneurysm) ([place])   . AAA (abdominal aortic aneurysm) without rupture ([place]) 02/04/2014  . Cancer ([place])    lung  . Encounter for antineoplastic chemotherapy 06/06/2016  . History of hepatitis B   . HNP (herniated nucleus pulposus), lumbar    L4 with radiculopathy  . Hypertension   . Lung cancer ([place]) 05/18/2016  . Malignant neoplasm of lower lobe of left lung ([place]) 04/05/2016  . Mass of left lung   . Mitral insufficiency 04/06/2016   This patient will eventually need MV repair if his prognosis is good from oncology standpoint. However, his lung cancer therapy  is the priority right now. His cardiac condition won't preclude possible lung surgery.  Once his lung cancer is under control he will need a TEE to further evaluate his mitral valve anatomy and MR severity, and also have ischemic  workup as tere is evidence on calcificati  . Renal insufficiency 10/09/2016  . S/P partial lobectomy of lung 05/11/2016  . Varicose veins of legs    Past Surgical History:  Procedure Laterality Date  . ABDOMINAL AORTIC ENDOVASCULAR STENT GRAFT N/A 03/12/2016   Procedure: ABDOMINAL AORTIC ENDOVASCULAR STENT GRAFT;  Surgeon: Conrad [place], MD;  Location: [facility];  Service: Vascular;  Laterality: N/A;   . COLONOSCOPY    . INGUINAL HERNIA REPAIR Left 1975   Left inguinal hernia  . INGUINAL HERNIA REPAIR Right   . IR IMAGING GUIDED PORT INSERTION  09/04/2019  . LOBECTOMY Left 05/11/2016   Procedure: LEFT LOWER LOBE LOBECTOMY AND LEFT UPPER LOBE  RESECTION AND PLACEMENT OF ON-Q;  Surgeon: [name], MD;  Location: [facility];  Service: Thoracic;  Laterality: Left;  . LUMBAR LAMINECTOMY  October 22, 2012  . LYMPH NODE DISSECTION Left 05/11/2016   Procedure: LYMPH NODE DISSECTION;  Surgeon: [name], MD;  Location: [facility];  Service: Thoracic;  Laterality: Left;  . RIGHT/LEFT HEART CATH AND CORONARY ANGIOGRAPHY N/A 03/10/2020   Procedure: RIGHT/LEFT HEART CATH AND CORONARY ANGIOGRAPHY;  Surgeon: Sherren Mocha, MD;  Location: [facility];  Service: Cardiovascular;  Laterality: N/A;  . STAPLING OF BLEBS Left 05/11/2016   Procedure: STAPLING OF APICAL BLEB;  Surgeon: [name], MD;  Location: [facility];  Service: Thoracic;  Laterality: Left;  . TEE WITHOUT CARDIOVERSION N/A 10/07/2019   Procedure: TRANSESOPHAGEAL ECHOCARDIOGRAM (TEE);  Surgeon: Dorothy Spark, MD;  Location: [facility];  Service: Cardiovascular;  Laterality: N/A;  . VIDEO ASSISTED THORACOSCOPY Left 05/18/2016   Procedure: VIDEO ASSISTED THORACOSCOPY WITH REMOVAL OF LEFT APICAL BLEB;  Surgeon: [name], MD;  Location: [facility];  Service: Thoracic;  Laterality: Left;  Marland Kitchen VIDEO ASSISTED THORACOSCOPY (VATS)/WEDGE RESECTION Left 05/11/2016   Procedure: LEFT VIDEO ASSISTED THORACOSCOPY (VATS);  Surgeon: [name], MD;  Location: [facility];  Service: Thoracic;  Laterality: Left;  Marland Kitchen VIDEO BRONCHOSCOPY N/A 05/11/2016   Procedure: VIDEO BRONCHOSCOPY, LEFT LUNG;  Surgeon: [name], MD;  Location: [facility];  Service: Thoracic;  Laterality: N/A;  . VIDEO BRONCHOSCOPY N/A 05/18/2016   Procedure: VIDEO BRONCHOSCOPY WITH BRONCHIAL WASHING;  Surgeon: [name], MD;  Location: [facility];  Service: Thoracic;   Laterality: N/A;   Current Medications: Outpatient Medications Prior to Visit  Medication Sig Dispense Refill  . carvedilol (COREG) 6.25 MG tablet Take 1 tablet (6.25 mg total) by mouth 2 (two) times daily. 025 tablet 3  . folic acid (FOLVITE) 1 MG tablet Take 1 tablet (1 mg total) by mouth daily. 90 tablet 1  . lisinopril (ZESTRIL) 2.5 MG tablet TAKE 1 TABLET BY MOUTH EVERY DAY 90 tablet 3   No facility-administered medications prior to visit.    Allergies:   Tramadol   Social History   Socioeconomic History  . Marital status: Married    Spouse name: Not on file  . Number of children: Not on file  . Years of education: Not on file  . Highest education level: Not on file  Occupational History  . Not on file  Tobacco Use  . Smoking status: Former Smoker    Packs/day: 1.00    Types: Cigarettes    Quit date: 03/05/2016    Years since quitting: 4.3  . Smokeless tobacco: Never Used  Vaping Use  . Vaping Use: Never used  Substance and Sexual Activity  . Alcohol use: Yes  Alcohol/week: 6.0 standard drinks    Types: 6 Cans of beer per week  . 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 Sexual activity: Not on file  Other Topics Concern  . Not on file  Social History Narrative  . Not on file   Social Determinants of Health   Financial Resource Strain: Not on file  Food Insecurity: Not on file  Transportation Needs: Not on file  Physical Activity: Not on file  Stress: Not on file  Social Connections: Not on file    Family History:  The patient's family history includes Diabetes in his mother.   ROS:   Please see the history of present illness.    ROS All other systems reviewed and are negative.  PHYSICAL EXAM:    VS:  BP 122/78   Pulse 75   Ht 6' 3.5" (1.918 m)   Wt 150 lb (68 kg)   SpO2 95%   BMI 18.50 kg/m    GEN: Well nourished, well developed, in no acute distress  HEENT: normal  Neck: no JVD, carotid bruits, or  masses Cardiac: RRR; 4/6 systolic murmurs, rubs, or gallops,no edema  Respiratory:  clear to auscultation bilaterally, normal work of breathing GI: soft, nontender, nondistended, + BS MS: no deformity or atrophy  Skin: warm and dry, no rash Neuro:  Alert and Oriented x 3, Strength and sensation are intact Psych: euthymic mood, full affect  Wt Readings from Last 3 Encounters:  07/11/20 150 lb (68 kg)  06/30/20 152 lb 8 oz (69.2 kg)  06/09/20 148 lb 14.4 oz (67.5 kg)    Studies/Labs Reviewed:   EKG:  EKG is ordered today.  It shows normal sinus rhythm with LVH, early repolarization unchanged from prior. Personally reviewed.  Recent Labs: 06/30/2020: ALT 11; BUN 17; Creatinine 1.64; Hemoglobin 13.1; Platelet Count 313; Potassium 4.4; Sodium 131; TSH 2.203   Lipid Panel No results found for: CHOL, TRIG, HDL, CHOLHDL, VLDL, LDLCALC, LDLDIRECT  Additional studies/ records that were reviewed today include:   TTE: 08/18/2019    1. Left ventricular ejection fraction, by estimation, is 60 to 65%. The  left ventricle has normal function. The left ventricle has no regional  wall motion abnormalities. There is mild left ventricular hypertrophy.  Left ventricular diastolic parameters  are consistent with Grade I diastolic dysfunction (impaired relaxation).  2. Right ventricular systolic function is normal. The right ventricular  size is normal. There is severely elevated pulmonary artery systolic  pressure. The estimated right ventricular systolic pressure is 15.1 mmHg.  3. Left atrial size was moderately dilated.  4. The mitral valve is myxomatous. Severe mitral valve regurgitation.  5. The aortic valve is tricuspid. Aortic valve regurgitation is not  visualized.  6. Aortic dilatation noted. Aneurysm of the ascending aorta, measuring 40  mm. There is moderate to severe dilatation at the level of the sinuses of  Valsalva measuring 49 mm.   TEE 10/07/2019  1. There is 4+ primary  mitral regurgitation with myxomatous anterior  mitral valve leaflet, with prolapse of all three scallops. The jet is  posteriorly directed, eccentric with reversal of pulmonary vein forward  flow bilateraly. PISA radius 1.0 cm, ERO  0.45 cm2, regurgitation volume 58 ml. Posterior leaflet measures 1.0 cm.  Flail gap 0.3 cm.  2. Left ventricular ejection fraction, by estimation, is 60 to 65%. The  left ventricle has normal function. The left ventricle has no  regional  wall motion abnormalities.  3. Right ventricular systolic function is normal. The right ventricular  size is normal. There is normal pulmonary artery systolic pressure.  4. Left atrial size was moderately dilated. No left atrial/left atrial  appendage thrombus was detected. The LAA emptying velocity was 40 cm/s.  5. Right atrial size was mildly dilated.  6. The mitral valve is myxomatous. No evidence of mitral valve  regurgitation. No evidence of mitral stenosis.  7. Tricuspid valve regurgitation is moderate.  8. The aortic valve is normal in structure. Aortic valve regurgitation is  trivial. No aortic stenosis is present.  9. There is moderate dilatation at the level of the sinuses of Valsalva  measuring 48 mm. There is mild (Grade II) plaque.    ASSESSMENT:    1. Essential hypertension   2. Mitral valve insufficiency, unspecified etiology   3. Severe mitral regurgitation   4. Ascending aortic aneurysm (HCC)      PLAN:  In order of problems listed above:  1.  Mitral valve prolapse severe 4+ mitral regurgitation, with worsening symptoms NYHA class IIb TEE in June 2021, repeat echocardiogram in February 2022 showed severe mitral regurgitation however normal filling pressure consistent with findings on cardiac cath in November 2021 and normal right-sided pressures.  Patient symptoms have not changed. I will have him follow-up in 3 months with Dr. Johney Frame and repeat his echocardiogram in 6 months unless there is  change in his symptoms.  2.  Lung cancer -followed by Dr. Earlie Server.  I have communicated with him patient's plan and he believes he should be treated as if he did not have cancer.  He is responding well to chemotherapy and his lesion has been shrinking.  He is scheduled for another round of PET scanning in July.    3.  Ascending aortic aneurysm -followed by Dr. Servando Snare, most recent CTA in January 2022 showed stable diameter 42 mm.  4.  Essential hypertension -well-controlled on lisinopril and amlodipine  Medication Adjustments/Labs and Tests Ordered: Current medicines are reviewed at length with the patient today.  Concerns regarding medicines are outlined above.  Medication changes, Labs and Tests ordered today are listed in the Patient Instructions below.  Patient Instructions  Medication Instructions:   Your physician recommends that you continue on your current medications as directed. Please refer to the Current Medication list given to you today.  *If you need a refill on your cardiac medications before your next appointment, please call your pharmacy*    Follow-Up:  3 MONTHS IN THE OFFICE TO SEE AND ESTABLISH WITH DR. Johney Frame      Signed, William Dawley, MD  07/11/2020 10:56 AM    [facility] [address], [place], [place]  [address] Phone: [phone]; Fax: [phone]

## 2020-07-11 NOTE — Patient Instructions (Signed)
Medication Instructions:   Your physician recommends that you continue on your current medications as directed. Please refer to the Current Medication list given to you today.  *If you need a refill on your cardiac medications before your next appointment, please call your pharmacy*    Follow-Up:  3 MONTHS IN THE OFFICE TO SEE AND ESTABLISH WITH DR. Johney Frame

## 2020-07-14 NOTE — Progress Notes (Deleted)
[facility] OFFICE PROGRESS NOTE  [name], [address] [place] [id number]  DIAGNOSIS: Metastatic non-small cell lung cancer, adenocarcinoma initially diagnosed as stage IB (T2a, N0, M0) non-small cell lung cancer, adenocarcinoma diagnosed in December 2017. The patient has evidence for disease recurrence in July 2019.  Biomarker Findings Tumor Mutational Burden - TMB-Intermediate (16 Muts/Mb) Microsatellite status - MS-Stable Genomic Findings For a complete list of the genes assayed, please refer to the Appendix. KIT amplification KRAS F47B SMAD4 splice site [id number]>[id number] KR83 I232F 7 Disease relevant genes with no reportable alterations: EGFR, ALK, BRAF, MET, RET, ERBB2, ROS1   PDL 1 expression is 0%  PRIOR THERAPY: 1) status post left lower lobectomy as well as wedge resection of the left upper lobe on 05/11/2016. 2) Adjuvant systemic chemotherapy with cisplatin 75 MG/M2 and Alimta 500 MG/M2 every 3 weeks. First dose 07/03/2016. Status post 4 cycles.  CURRENT THERAPY: Systemic chemotherapy with carboplatin for AUC of 5, Alimta 500 mg/M2 and Keytruda 200 mg IV every 3 weeks. Status post43cycles. Starting from cycle #5 the patient will be treated with maintenance Alimta and Ketruda (pembrolizumab) every 3 weeks.  INTERVAL HISTORY: [name] 65 y.o. male returns to the clinic today forafollow-up visit. The patient is feeling well today without any concerning complaints. He continues to tolerate his maintenance systemic chemotherapy with Alimta and Keytruda fairly well. Alimta was held for his last cycle of treatment due to renal insuffiencey. He denied having any chest pain, shortness of breath, cough or hemoptysis. He denied having any fever or chills. He has no nausea, vomiting, diarrhea or constipation. He denied having any headache or visual changes.He was supposed to have a restaging CT scan of the chest, abdomen, and pelvis but it  has not been scheduled at this time. The patient is here today for evaluation before starting cycle #44.   MEDICAL HISTORY: Past Medical History:  Diagnosis Date  . AAA (abdominal aortic aneurysm) ([facility])   . AAA (abdominal aortic aneurysm) without rupture ([facility]) 02/04/2014  . Cancer ([facility] [place])    lung  . Encounter for antineoplastic chemotherapy 06/06/2016  . History of hepatitis B   . HNP (herniated nucleus pulposus), lumbar    L4 with radiculopathy  . Hypertension   . Lung cancer ([facility]) 05/18/2016  . Malignant neoplasm of lower lobe of left lung ([place]) 04/05/2016  . Mass of left lung   . Mitral insufficiency 04/06/2016   This patient will eventually need MV repair if his prognosis is good from oncology standpoint. However, his lung cancer therapy  is the priority right now. His cardiac condition won't preclude possible lung surgery.  Once his lung cancer is under control he will need a TEE to further evaluate his mitral valve anatomy and MR severity, and also have ischemic workup as tere is evidence on calcificati  . Renal insufficiency 10/09/2016  . S/P partial lobectomy of lung 05/11/2016  . Varicose veins of legs     ALLERGIES:  is allergic to tramadol.  MEDICATIONS:  Current Outpatient Medications  Medication Sig Dispense Refill  . carvedilol (COREG) 6.25 MG tablet Take 1 tablet (6.25 mg total) by mouth 2 (two) times daily. 818 tablet 3  . folic acid (FOLVITE) 1 MG tablet Take 1 tablet (1 mg total) by mouth daily. 90 tablet 1  . lisinopril (ZESTRIL) 2.5 MG tablet TAKE 1 TABLET BY MOUTH EVERY DAY 90 tablet 3   No current facility-administered medications for this visit.  SURGICAL HISTORY:  Past Surgical History:  Procedure Laterality Date  . ABDOMINAL AORTIC ENDOVASCULAR STENT GRAFT N/A 03/12/2016   Procedure: ABDOMINAL AORTIC ENDOVASCULAR STENT GRAFT;  Surgeon: Conrad [place], MD;  Location: [facility];  Service: Vascular;  Laterality: N/A;  . COLONOSCOPY    . INGUINAL HERNIA REPAIR  Left 1975   Left inguinal hernia  . INGUINAL HERNIA REPAIR Right   . IR IMAGING GUIDED PORT INSERTION  09/04/2019  . LOBECTOMY Left 05/11/2016   Procedure: LEFT LOWER LOBE LOBECTOMY AND LEFT UPPER LOBE  RESECTION AND PLACEMENT OF ON-Q;  Surgeon: [name], MD;  Location: [facility];  Service: Thoracic;  Laterality: Left;  . LUMBAR LAMINECTOMY  October 22, 2012  . LYMPH NODE DISSECTION Left 05/11/2016   Procedure: LYMPH NODE DISSECTION;  Surgeon: [name], MD;  Location: [facility];  Service: Thoracic;  Laterality: Left;  . RIGHT/LEFT HEART CATH AND CORONARY ANGIOGRAPHY N/A 03/10/2020   Procedure: RIGHT/LEFT HEART CATH AND CORONARY ANGIOGRAPHY;  Surgeon: Sherren Mocha, MD;  Location: [facility];  Service: Cardiovascular;  Laterality: N/A;  . STAPLING OF BLEBS Left 05/11/2016   Procedure: STAPLING OF APICAL BLEB;  Surgeon: [name], MD;  Location: [facility];  Service: Thoracic;  Laterality: Left;  . TEE WITHOUT CARDIOVERSION N/A 10/07/2019   Procedure: TRANSESOPHAGEAL ECHOCARDIOGRAM (TEE);  Surgeon: Dorothy Spark, MD;  Location: [facility];  Service: Cardiovascular;  Laterality: N/A;  . VIDEO ASSISTED THORACOSCOPY Left 05/18/2016   Procedure: VIDEO ASSISTED THORACOSCOPY WITH REMOVAL OF LEFT APICAL BLEB;  Surgeon: [name], MD;  Location: [facility];  Service: Thoracic;  Laterality: Left;  Marland Kitchen VIDEO ASSISTED THORACOSCOPY (VATS)/WEDGE RESECTION Left 05/11/2016   Procedure: LEFT VIDEO ASSISTED THORACOSCOPY (VATS);  Surgeon: [name], MD;  Location: [facility];  Service: Thoracic;  Laterality: Left;  Marland Kitchen VIDEO BRONCHOSCOPY N/A 05/11/2016   Procedure: VIDEO BRONCHOSCOPY, LEFT LUNG;  Surgeon: [name], MD;  Location: [facility];  Service: Thoracic;  Laterality: N/A;  . VIDEO BRONCHOSCOPY N/A 05/18/2016   Procedure: VIDEO BRONCHOSCOPY WITH BRONCHIAL WASHING;  Surgeon: [name], MD;  Location: [facility];  Service: Thoracic;  Laterality: N/A;    REVIEW OF SYSTEMS:   Review  of Systems  Constitutional: Negative for appetite change, chills, fatigue, fever and unexpected weight change.  HENT:   Negative for mouth sores, nosebleeds, sore throat and trouble swallowing.   Eyes: Negative for eye problems and icterus.  Respiratory: Negative for cough, hemoptysis, shortness of breath and wheezing.   Cardiovascular: Negative for chest pain and leg swelling.  Gastrointestinal: Negative for abdominal pain, constipation, diarrhea, nausea and vomiting.  Genitourinary: Negative for bladder incontinence, difficulty urinating, dysuria, frequency and hematuria.   Musculoskeletal: Negative for back pain, gait problem, neck pain and neck stiffness.  Skin: Negative for itching and rash.  Neurological: Negative for dizziness, extremity weakness, gait problem, headaches, light-headedness and seizures.  Hematological: Negative for adenopathy. Does not bruise/bleed easily.  Psychiatric/Behavioral: Negative for confusion, depression and sleep disturbance. The patient is not nervous/anxious.     PHYSICAL EXAMINATION:  There were no vitals taken for this visit.  ECOG PERFORMANCE STATUS: {CHL ONC ECOG [id number]  Physical Exam  Constitutional: Oriented to person, place, and time and well-developed, well-nourished, and in no distress. No distress.  HENT:  Head: Normocephalic and atraumatic.  Mouth/Throat: Oropharynx is clear and moist. No oropharyngeal exudate.  Eyes: Conjunctivae are normal. Right eye exhibits no discharge. Left eye exhibits no discharge. No scleral  icterus.  Neck: Normal range of motion. Neck supple.  Cardiovascular: Normal rate, regular rhythm, normal heart sounds and intact distal pulses.   Pulmonary/Chest: Effort normal and breath sounds normal. No respiratory distress. No wheezes. No rales.  Abdominal: Soft. Bowel sounds are normal. Exhibits no distension and no mass. There is no tenderness.  Musculoskeletal: Normal range of motion. Exhibits no edema.   Lymphadenopathy:    No cervical adenopathy.  Neurological: Alert and oriented to person, place, and time. Exhibits normal muscle tone. Gait normal. Coordination normal.  Skin: Skin is warm and dry. No rash noted. Not diaphoretic. No erythema. No pallor.  Psychiatric: Mood, memory and judgment normal.  Vitals reviewed.  LABORATORY DATA: Lab Results  Component Value Date   WBC 9.9 06/30/2020   HGB 13.1 06/30/2020   HCT 39.3 06/30/2020   MCV 101.0 (H) 06/30/2020   PLT 313 06/30/2020      Chemistry      Component Value Date/Time   NA 131 (L) 06/30/2020 0903   NA 132 (L) 10/05/2019 0830   NA 138 01/11/2017 1343   K 4.4 06/30/2020 0903   K 4.7 01/11/2017 1343   CL 106 06/30/2020 0903   CO2 20 (L) 06/30/2020 0903   CO2 23 01/11/2017 1343   BUN 17 06/30/2020 0903   BUN 14 10/05/2019 0830   BUN 20.2 01/11/2017 1343   CREATININE 1.64 (H) 06/30/2020 0903   CREATININE 1.6 (H) 01/11/2017 1343      Component Value Date/Time   CALCIUM 8.7 (L) 06/30/2020 0903   CALCIUM 9.3 01/11/2017 1343   ALKPHOS 92 06/30/2020 0903   ALKPHOS 89 01/11/2017 1343   AST 25 06/30/2020 0903   AST 41 (H) 01/11/2017 1343   ALT 11 06/30/2020 0903   ALT 27 01/11/2017 1343   BILITOT 0.4 06/30/2020 0903   BILITOT 0.49 01/11/2017 1343       RADIOGRAPHIC STUDIES:  No results found.   ASSESSMENT/PLAN:  This is a very pleasant 65year old [place] American male with metastatic disease who was initially diagnosed with stage IB non-small cell lung cancer, adenocarcinoma of the left upper lobe. Molecular studies showed no actionable mutations. PD-L1 expression was negative. First diagnosed December 2017.  He was status post a wedge resection of the left upper lobe followed by 4 cycles of adjuvant systemic chemotherapy with cisplatin and Alimta. He tolerated treatment well except for fatigue.  He had been on observationuntilJune of 2018.  He had a repeat CT and PET scan which showed disease recurrence as  well as metastatic disease with bilaterally pulmonary nodules and destructive bone lesions at the T4 vertebral body.  Theis currently undergoingsystemic chemotherapy with carboplatin, alimta, and keytruda. He is status post43cycles. Starting from cycle #5, he has been on maintenance Alimta and Keytruda every 3 weeks   Labs were reviewed. His creatinine is _ today.  Recommend that he _ with cycle #44 today as scheduled with both Alimta 500 mg/m2 and Keytruda 200 mg.??   I gave the patient instructions on how to reschedule his restaging CT scan.   We will see him back for a follow up visit in 3 weeks for evaluation before starting cycle #45.  The patient was advised to call immediately if he has any concerning symptoms in the interval. The patient voices understanding of current disease status and treatment options and is in agreement with the current care plan. All questions were answered. The patient knows to call the clinic with any problems, questions or concerns.  We can certainly see the patient much sooner if necessary         No orders of the defined types were placed in this encounter.    I spent {CHL ONC TIME VISIT - RJJOA:[id number]} counseling the patient face to face. The total time spent in the appointment was {CHL ONC TIME VISIT - WFUXN:[id number]}.  Chinwe Lope L Arrion Burruel, PA-C 07/14/20

## 2020-07-15 ENCOUNTER — Telehealth: Payer: Self-pay

## 2020-07-15 NOTE — Telephone Encounter (Signed)
Pt has a follow-up appt 07/21/20 but has not had his CT scan done.  I have spoken with the pt and provided the phone number to Radiology to have this scheduled before his next appt. Pt expressed understanding of this information and advised he will have it scheduled today.

## 2020-07-19 ENCOUNTER — Ambulatory Visit (HOSPITAL_COMMUNITY)
Admission: RE | Admit: 2020-07-19 | Discharge: 2020-07-19 | Disposition: A | Discharge status: Home / Self Care | Payer: Federal, State, Local not specified - PPO | Source: Ambulatory Visit | Attending: Internal Medicine | Admitting: Internal Medicine

## 2020-07-19 ENCOUNTER — Other Ambulatory Visit: Payer: Self-pay

## 2020-07-19 DIAGNOSIS — C3490 Malignant neoplasm of unspecified part of unspecified bronchus or lung: Secondary | ICD-10-CM | POA: Diagnosis not present

## 2020-07-19 MED ORDER — HEPARIN SOD (PORK) LOCK FLUSH 100 UNIT/ML IV SOLN
INTRAVENOUS | Status: AC
  Filled 2020-07-19: qty 5

## 2020-07-19 MED ORDER — IOHEXOL 300 MG/ML  SOLN
75.0000 mL | Freq: Once | INTRAMUSCULAR | Status: AC | PRN
  Administered 2020-07-19: 75 mL via INTRAVENOUS

## 2020-07-19 MED ORDER — HEPARIN SOD (PORK) LOCK FLUSH 100 UNIT/ML IV SOLN: 500.0000 [IU] | Freq: Once | INTRAVENOUS | Status: DC

## 2020-07-20 MED FILL — Dexamethasone Sodium Phosphate Inj 100 MG/10ML: INTRAMUSCULAR | Qty: 1 | Status: AC

## 2020-07-21 ENCOUNTER — Ambulatory Visit: Payer: Medicare Other | Admitting: Physician Assistant

## 2020-07-21 ENCOUNTER — Telehealth: Payer: Self-pay | Admitting: Physician Assistant

## 2020-07-21 ENCOUNTER — Telehealth: Payer: Self-pay | Admitting: Medical Oncology

## 2020-07-21 ENCOUNTER — Ambulatory Visit: Payer: Medicare Other

## 2020-07-21 ENCOUNTER — Other Ambulatory Visit: Payer: Medicare Other

## 2020-07-21 ENCOUNTER — Ambulatory Visit: Payer: Medicare Other | Admitting: Internal Medicine

## 2020-07-21 NOTE — Telephone Encounter (Signed)
R/s appts per 3/31 sch msg. Pt aware.

## 2020-07-21 NOTE — Telephone Encounter (Signed)
Diarrhea started after barium for CT scan. Pt called and cancelled his appts today. He is going to take Imodium AD . He declined IVF. Schedule message sent to r/s for next week.

## 2020-07-25 DIAGNOSIS — I050 Rheumatic mitral stenosis: Secondary | ICD-10-CM | POA: Diagnosis not present

## 2020-07-25 DIAGNOSIS — C3490 Malignant neoplasm of unspecified part of unspecified bronchus or lung: Secondary | ICD-10-CM | POA: Diagnosis not present

## 2020-07-25 DIAGNOSIS — R634 Abnormal weight loss: Secondary | ICD-10-CM | POA: Diagnosis not present

## 2020-07-25 DIAGNOSIS — I10 Essential (primary) hypertension: Secondary | ICD-10-CM | POA: Diagnosis not present

## 2020-07-26 NOTE — Progress Notes (Signed)
[facility] OFFICE PROGRESS NOTE  [name], [address] [place] [address]  DIAGNOSIS: Metastatic non-small cell lung cancer, adenocarcinoma initially diagnosed as stage IB (T2a, N0, M0) non-small cell lung cancer, adenocarcinoma diagnosed in December 2017. The patient has evidence for disease recurrence in July 2019.  Biomarker Findings Tumor Mutational Burden - TMB-Intermediate (16 Muts/Mb) Microsatellite status - MS-Stable Genomic Findings For a complete list of the genes assayed, please refer to the Appendix. KIT amplification KRAS T05W SMAD4 splice site [id number] KP53 [id number] 7 Disease relevant genes with no reportable alterations: EGFR, ALK, BRAF, MET, RET, ERBB2, ROS1   PDL 1 expression is 0%  PRIOR THERAPY:  1) status post left lower lobectomy as well as wedge resection of the left upper lobe on 05/11/2016. 2) Adjuvant systemic chemotherapy with cisplatin 75 MG/M2 and Alimta 500 MG/M2 every 3 weeks. First dose 07/03/2016. Status post 4 cycles.  CURRENT THERAPY: Systemic chemotherapy with carboplatin for AUC of 5, Alimta 500 mg/M2 and Keytruda 200 mg IV every 3 weeks. Status post43cycles. Starting from cycle #5 the patient will be treated with maintenance Alimta and Ketruda (pembrolizumab) every 3 weeks. Alimta occasionally needs to be held due to renal insufficiency.   INTERVAL HISTORY: William Kales [name] 65 y.o. male returns to the clinic today forafollow-up visit. The patient is feeling well today without any concerning complaints. He missed his appointment last week because he developed diarrhea following his restaging CT scan from the oral contrast. He states he had diarrhea for about 3 days or so. He continues to tolerate his maintenance systemic chemotherapy with Alimta and Keytruda fairly well. Alimta was held for his last cycle of treatment due to renal insuffiencey. He denied having any chest pain, cough, or hemoptysis. He reports  the same shortness of breath with exertion.He denied having any fever or chills. He has no nausea, vomiting, or constipation. He denied having any headache or visual changes.The patient recently had a restaging CT scan performed. The patient is here today for evaluation before starting cycle #44.  MEDICAL HISTORY: Past Medical History:  Diagnosis Date  . AAA (abdominal aortic aneurysm) ([facility])   . AAA (abdominal aortic aneurysm) without rupture ([facility]) 02/04/2014  . Cancer ([facility])    lung  . Encounter for antineoplastic chemotherapy 06/06/2016  . History of hepatitis B   . HNP (herniated nucleus pulposus), lumbar    L4 with radiculopathy  . Hypertension   . Lung cancer ([facility]) 05/18/2016  . Malignant neoplasm of lower lobe of left lung ([facility]) 04/05/2016  . Mass of left lung   . Mitral insufficiency 04/06/2016   This patient will eventually need MV repair if his prognosis is good from oncology standpoint. However, his lung cancer therapy  is the priority right now. His cardiac condition won't preclude possible lung surgery.  Once his lung cancer is under control he will need a TEE to further evaluate his mitral valve anatomy and MR severity, and also have ischemic workup as tere is evidence on calcificati  . Renal insufficiency 10/09/2016  . S/P partial lobectomy of lung 05/11/2016  . Varicose veins of legs     ALLERGIES:  is allergic to tramadol.  MEDICATIONS:  Current Outpatient Medications  Medication Sig Dispense Refill  . carvedilol (COREG) 6.25 MG tablet Take 1 tablet (6.25 mg total) by mouth 2 (two) times daily. 748 tablet 3  . folic acid (FOLVITE) 1 MG tablet Take 1 tablet (1 mg total) by mouth  daily. 90 tablet 1  . lisinopril (ZESTRIL) 2.5 MG tablet TAKE 1 TABLET BY MOUTH EVERY DAY 90 tablet 3   No current facility-administered medications for this visit.    SURGICAL HISTORY:  Past Surgical History:  Procedure Laterality Date  . ABDOMINAL AORTIC ENDOVASCULAR STENT GRAFT N/A  03/12/2016   Procedure: ABDOMINAL AORTIC ENDOVASCULAR STENT GRAFT;  Surgeon: Conrad Jonesburg, MD;  Location: [facility];  Service: Vascular;  Laterality: N/A;  . COLONOSCOPY    . INGUINAL HERNIA REPAIR Left 1975   Left inguinal hernia  . INGUINAL HERNIA REPAIR Right   . IR IMAGING GUIDED PORT INSERTION  09/04/2019  . LOBECTOMY Left 05/11/2016   Procedure: LEFT LOWER LOBE LOBECTOMY AND LEFT UPPER LOBE  RESECTION AND PLACEMENT OF ON-Q;  Surgeon: [name], MD;  Location: [facility];  Service: Thoracic;  Laterality: Left;  . LUMBAR LAMINECTOMY  October 22, 2012  . LYMPH NODE DISSECTION Left 05/11/2016   Procedure: LYMPH NODE DISSECTION;  Surgeon: [name], MD;  Location: [facility];  Service: Thoracic;  Laterality: Left;  . RIGHT/LEFT HEART CATH AND CORONARY ANGIOGRAPHY N/A 03/10/2020   Procedure: RIGHT/LEFT HEART CATH AND CORONARY ANGIOGRAPHY;  Surgeon: Sherren Mocha, MD;  Location: [facility];  Service: Cardiovascular;  Laterality: N/A;  . STAPLING OF BLEBS Left 05/11/2016   Procedure: STAPLING OF APICAL BLEB;  Surgeon: [name], MD;  Location: [facility];  Service: Thoracic;  Laterality: Left;  . TEE WITHOUT CARDIOVERSION N/A 10/07/2019   Procedure: TRANSESOPHAGEAL ECHOCARDIOGRAM (TEE);  Surgeon: Dorothy Spark, MD;  Location: [facility]OSCOPY;  Service: Cardiovascular;  Laterality: N/A;  . VIDEO ASSISTED THORACOSCOPY Left 05/18/2016   Procedure: VIDEO ASSISTED THORACOSCOPY WITH REMOVAL OF LEFT APICAL BLEB;  Surgeon: [name], MD;  Location: [facility];  Service: Thoracic;  Laterality: Left;  Marland Kitchen VIDEO ASSISTED THORACOSCOPY (VATS)/WEDGE RESECTION Left 05/11/2016   Procedure: LEFT VIDEO ASSISTED THORACOSCOPY (VATS);  Surgeon: [name], MD;  Location: [facility];  Service: Thoracic;  Laterality: Left;  Marland Kitchen VIDEO BRONCHOSCOPY N/A 05/11/2016   Procedure: VIDEO BRONCHOSCOPY, LEFT LUNG;  Surgeon: [name], MD;  Location: [facility];  Service: Thoracic;  Laterality: N/A;  . VIDEO  BRONCHOSCOPY N/A 05/18/2016   Procedure: VIDEO BRONCHOSCOPY WITH BRONCHIAL WASHING;  Surgeon: [name], MD;  Location: [facility];  Service: Thoracic;  Laterality: N/A;    REVIEW OF SYSTEMS:   Review of Systems  Constitutional: Negative for appetite change, chills, fatigue, fever and unexpected weight change.  HENT: Negative for mouth sores, nosebleeds, sore throat and trouble swallowing.   Eyes: Negative for eye problems and icterus.  Respiratory: Negative for cough, hemoptysis, shortness of breath and wheezing.   Cardiovascular: Negative for chest pain and leg swelling.  Gastrointestinal: Positive for diarrhea (resolved). Negative for abdominal pain, constipation, nausea and vomiting.  Genitourinary: Negative for bladder incontinence, difficulty urinating, dysuria, frequency and hematuria.   Musculoskeletal: Negative for back pain, gait problem, neck pain and neck stiffness.  Skin: Negative for itching and rash.  Neurological: Negative for dizziness, extremity weakness, gait problem, headaches, light-headedness and seizures.  Hematological: Negative for adenopathy. Does not bruise/bleed easily.  Psychiatric/Behavioral: Negative for confusion, depression and sleep disturbance. The patient is not nervous/anxious.   PHYSICAL EXAMINATION:  There were no vitals taken for this visit.  ECOG PERFORMANCE STATUS: 1 - Symptomatic but completely ambulatory  Physical Exam  Constitutional: Oriented to person, place, and time and well-developed, well-nourished, and in no distress.  HENT:  Head: Normocephalic and atraumatic.  Mouth/Throat: Oropharynx is clear and moist. No oropharyngeal exudate.  Eyes: Conjunctivae are normal. Right eye exhibits no discharge. Left eye exhibits no discharge. No scleral icterus.  Neck: Normal range of motion. Neck supple.  Cardiovascular:Systolic murmur auscultatedin hisposterior left thorax. Normal rate, regular rhythm and intact distal pulses.   Pulmonary/Chest: Effort normal and breath sounds normal. No respiratory distress. No wheezes. No rales.  Abdominal: Soft. Bowel sounds are normal. Exhibits no distension and no mass. There is no tenderness.  Musculoskeletal: Normal range of motion. Exhibits no edema.  Lymphadenopathy:  No cervical adenopathy.  Neurological: Alert and oriented to person, place, and time. Exhibits normal muscle tone. Gait normal. Coordination normal.  Skin: Skin is warm and dry. No rash noted. Not diaphoretic. No erythema. No pallor.  Psychiatric: Mood, memory and judgment normal.  Vitals reviewed  LABORATORY DATA: Lab Results  Component Value Date   WBC 9.9 06/30/2020   HGB 13.1 06/30/2020   HCT 39.3 06/30/2020   MCV 101.0 (H) 06/30/2020   PLT 313 06/30/2020      Chemistry      Component Value Date/Time   NA 131 (L) 06/30/2020 0903   NA 132 (L) 10/05/2019 0830   NA 138 01/11/2017 1343   K 4.4 06/30/2020 0903   K 4.7 01/11/2017 1343   CL 106 06/30/2020 0903   CO2 20 (L) 06/30/2020 0903   CO2 23 01/11/2017 1343   BUN 17 06/30/2020 0903   BUN 14 10/05/2019 0830   BUN 20.2 01/11/2017 1343   CREATININE 1.64 (H) 06/30/2020 0903   CREATININE 1.6 (H) 01/11/2017 1343      Component Value Date/Time   CALCIUM 8.7 (L) 06/30/2020 0903   CALCIUM 9.3 01/11/2017 1343   ALKPHOS 92 06/30/2020 0903   ALKPHOS 89 01/11/2017 1343   AST 25 06/30/2020 0903   AST 41 (H) 01/11/2017 1343   ALT 11 06/30/2020 0903   ALT 27 01/11/2017 1343   BILITOT 0.4 06/30/2020 0903   BILITOT 0.49 01/11/2017 1343       RADIOGRAPHIC STUDIES:  CT Chest W Contrast  Result Date: 07/19/2020 CLINICAL DATA:  Primary Cancer Type: Lung Imaging Indication: Assess response to therapy Interval therapy since last imaging? Yes Initial Cancer Diagnosis Date: 03/29/2016; Established by: Biopsy-proven Detailed Pathology: Stage Ib non-small cell lung cancer, adenocarcinoma. Primary Tumor location:  Left lower lobe. Recurrence? Yes;  Date(s) of recurrence: 12/02/2017; Established by: Biopsy-proven; metastasis to T4. Surgeries: Left lower lobectomy and left upper lobe wedge resection, with stapling of apical bleb, 05/11/2016. AAA stent graft 2017. Chemotherapy: Yes; Ongoing? Yes; Most recent administration: 06/30/2020 Immunotherapy?  Yes; Type: Alimta and Keytruda; Ongoing? Yes Radiation therapy? No EXAM: CT CHEST, ABDOMEN, AND PELVIS WITH CONTRAST TECHNIQUE: Multidetector CT imaging of the chest, abdomen and pelvis was performed following the standard protocol during bolus administration of intravenous contrast. CONTRAST:  52m OMNIPAQUE IOHEXOL 300 MG/ML  SOLN COMPARISON:  Most recent CT chest, abdomen and pelvis 04/26/2020. 11/12/2017 PET-CT. FINDINGS: CT CHEST FINDINGS Cardiovascular: Normal heart size. Stable trace pericardial effusion/thick right internal jugular Port-A-Cath terminates in the lower third of the SVC. Atherosclerotic thoracic aorta with stable mildly dilated 4.0 cm ascending thoracic aorta. Normal caliber pulmonary arteries. No central pulmonary emboli. Mediastinum/Nodes: Stable mixed density 1.9 cm left thyroid nodule. Unremarkable esophagus. No pathologically enlarged axillary, mediastinal or hilar lymph nodes. Lungs/Pleura: No pneumothorax. Trace dependent bilateral pleural effusions are unchanged. Stable curvilinear calcification throughout the posterior right pleural space. Severe centrilobular and paraseptal  emphysema with diffuse bronchial wall thickening. Status post left lower lobectomy. Stable postsurgical changes from wedge resection in the posterior left upper lobe. No acute consolidative airspace disease. Multiple (at least 5) new clustered right upper lobe solid pulmonary nodules, largest 0.7 cm (series 6/image 100). New 0.4 cm right middle lobe solid pulmonary nodule (series 6/image 127). New subpleural 0.7 cm basilar right lower lobe nodule (series 6/image 173). Medial dependent basilar right lower lobe 0.4 cm  solid pulmonary nodule (series 6/image 177) is stable. Musculoskeletal: Stable mildly expansile isodense posterior element T4 spinal lesion (series 6/image 67). No new focal osseous lesions in the chest. CT ABDOMEN PELVIS FINDINGS Hepatobiliary: Normal liver size. Several scattered tiny sub 5 mm hypodense right liver lesions are too small to characterize and are unchanged. No new liver lesions. Normal gallbladder with no radiopaque cholelithiasis. No biliary ductal dilatation. Pancreas: Normal, with no mass or duct dilation. Spleen: Normal size. No mass. Adrenals/Urinary Tract: No discrete adrenal nodules. No hydronephrosis. Scattered subcentimeter hypodense bilateral renal cortical lesions are too small to characterize and are unchanged, presumably benign. No new renal lesions. Normal nondistended bladder. Stomach/Bowel: Normal non-distended stomach. Normal caliber small bowel with no small bowel wall thickening. Normal appendix. Normal large bowel with no diverticulosis, large bowel wall thickening or pericolonic fat stranding. Vascular/Lymphatic: Atherosclerotic abdominal aorta status post aorto bi-iliac stent graft repair of 3.2 cm infrarenal abdominal aortic aneurysm. Patent portal, splenic, hepatic and renal veins. No pathologically enlarged lymph nodes in the abdomen or pelvis. Reproductive: Mild prostatomegaly. Other: No pneumoperitoneum, ascites or focal fluid collection. Musculoskeletal: No aggressive appearing focal osseous lesions. Marked lower lumbar degenerative disc disease. IMPRESSION: 1. Multiple new solid pulmonary nodules in the right lung, largest 0.7 cm in the right upper lobe, indeterminate, cannot exclude new pulmonary metastases. Suggest short-term follow-up chest CT in 3 months. 2. Stable postsurgical changes from left lower lobectomy and wedge resection in the posterior left upper lobe. No evidence of local tumor recurrence in the left lung. 3. Stable chronic mildly expansile posterior  element T4 spinal lesion. No new or progressive skeletal metastases. 4. Stable trace dependent bilateral pleural effusions. 5. No evidence of metastatic disease in the abdomen or pelvis. 6. Aortic Atherosclerosis (ICD10-I70.0) and Emphysema (ICD10-J43.9). Electronically Signed   By: Ilona Sorrel M.D.   On: 07/19/2020 11:39   CT Abdomen Pelvis W Contrast  Result Date: 07/19/2020 CLINICAL DATA:  Primary Cancer Type: Lung Imaging Indication: Assess response to therapy Interval therapy since last imaging? Yes Initial Cancer Diagnosis Date: 03/29/2016; Established by: Biopsy-proven Detailed Pathology: Stage Ib non-small cell lung cancer, adenocarcinoma. Primary Tumor location:  Left lower lobe. Recurrence? Yes; Date(s) of recurrence: 12/02/2017; Established by: Biopsy-proven; metastasis to T4. Surgeries: Left lower lobectomy and left upper lobe wedge resection, with stapling of apical bleb, 05/11/2016. AAA stent graft 2017. Chemotherapy: Yes; Ongoing? Yes; Most recent administration: 06/30/2020 Immunotherapy?  Yes; Type: Alimta and Keytruda; Ongoing? Yes Radiation therapy? No EXAM: CT CHEST, ABDOMEN, AND PELVIS WITH CONTRAST TECHNIQUE: Multidetector CT imaging of the chest, abdomen and pelvis was performed following the standard protocol during bolus administration of intravenous contrast. CONTRAST:  70m OMNIPAQUE IOHEXOL 300 MG/ML  SOLN COMPARISON:  Most recent CT chest, abdomen and pelvis 04/26/2020. 11/12/2017 PET-CT. FINDINGS: CT CHEST FINDINGS Cardiovascular: Normal heart size. Stable trace pericardial effusion/thick right internal jugular Port-A-Cath terminates in the lower third of the SVC. Atherosclerotic thoracic aorta with stable mildly dilated 4.0 cm ascending thoracic aorta. Normal caliber pulmonary arteries. No central pulmonary emboli. Mediastinum/Nodes:  Stable mixed density 1.9 cm left thyroid nodule. Unremarkable esophagus. No pathologically enlarged axillary, mediastinal or hilar lymph nodes.  Lungs/Pleura: No pneumothorax. Trace dependent bilateral pleural effusions are unchanged. Stable curvilinear calcification throughout the posterior right pleural space. Severe centrilobular and paraseptal emphysema with diffuse bronchial wall thickening. Status post left lower lobectomy. Stable postsurgical changes from wedge resection in the posterior left upper lobe. No acute consolidative airspace disease. Multiple (at least 5) new clustered right upper lobe solid pulmonary nodules, largest 0.7 cm (series 6/image 100). New 0.4 cm right middle lobe solid pulmonary nodule (series 6/image 127). New subpleural 0.7 cm basilar right lower lobe nodule (series 6/image 173). Medial dependent basilar right lower lobe 0.4 cm solid pulmonary nodule (series 6/image 177) is stable. Musculoskeletal: Stable mildly expansile isodense posterior element T4 spinal lesion (series 6/image 67). No new focal osseous lesions in the chest. CT ABDOMEN PELVIS FINDINGS Hepatobiliary: Normal liver size. Several scattered tiny sub 5 mm hypodense right liver lesions are too small to characterize and are unchanged. No new liver lesions. Normal gallbladder with no radiopaque cholelithiasis. No biliary ductal dilatation. Pancreas: Normal, with no mass or duct dilation. Spleen: Normal size. No mass. Adrenals/Urinary Tract: No discrete adrenal nodules. No hydronephrosis. Scattered subcentimeter hypodense bilateral renal cortical lesions are too small to characterize and are unchanged, presumably benign. No new renal lesions. Normal nondistended bladder. Stomach/Bowel: Normal non-distended stomach. Normal caliber small bowel with no small bowel wall thickening. Normal appendix. Normal large bowel with no diverticulosis, large bowel wall thickening or pericolonic fat stranding. Vascular/Lymphatic: Atherosclerotic abdominal aorta status post aorto bi-iliac stent graft repair of 3.2 cm infrarenal abdominal aortic aneurysm. Patent portal, splenic,  hepatic and renal veins. No pathologically enlarged lymph nodes in the abdomen or pelvis. Reproductive: Mild prostatomegaly. Other: No pneumoperitoneum, ascites or focal fluid collection. Musculoskeletal: No aggressive appearing focal osseous lesions. Marked lower lumbar degenerative disc disease. IMPRESSION: 1. Multiple new solid pulmonary nodules in the right lung, largest 0.7 cm in the right upper lobe, indeterminate, cannot exclude new pulmonary metastases. Suggest short-term follow-up chest CT in 3 months. 2. Stable postsurgical changes from left lower lobectomy and wedge resection in the posterior left upper lobe. No evidence of local tumor recurrence in the left lung. 3. Stable chronic mildly expansile posterior element T4 spinal lesion. No new or progressive skeletal metastases. 4. Stable trace dependent bilateral pleural effusions. 5. No evidence of metastatic disease in the abdomen or pelvis. 6. Aortic Atherosclerosis (ICD10-I70.0) and Emphysema (ICD10-J43.9). Electronically Signed   By: Ilona Sorrel M.D.   On: 07/19/2020 11:39     ASSESSMENT/PLAN:  This is a very pleasant 65year old [place] American male with metastatic disease who was initially diagnosed with stage IB non-small cell lung cancer, adenocarcinoma of the left upper lobe. Molecular studies showed KRAS G12C mutation which can be targetted in the second line setting in the future. PD-L1 expression was negative. First diagnosed December 2017.  He was status post a wedge resection of the left upper lobe followed by 4 cycles of adjuvant systemic chemotherapy with cisplatin and Alimta. He tolerated treatment well except for fatigue.  He had been on observationuntilJune of 2018.  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  Theis currently undergoingsystemic chemotherapy with carboplatin, alimta,  and keytruda. He is status post43cycles. Starting from cycle #5, he has been on maintenance Alimta and [place]  every 3 weeks. His Alimta has been held during a few cycles of treatment due to renal insufficiency.   The patient recently had a restaging CT scan of the chest, abdomen, and pelvis. The patient was seen with Dr. Julien Nordmann. Dr. Julien Nordmann personally and independently reviewed the scan and discussed the results with the patient. The scan showed multiple new small solid pulmonary nodules in the right lung, the largest 0.7 cm. These are indeterminate but cannot exclude new pulmonary metastases. They are below PET scan threshhold. Dr. Julien Nordmann recommends that the patient continue with 3 more cycles of his treatment before arranging for a restaging CT scan to further evaluate these lesions.   The patient will proceed with cycle #44 today as scheduled. However, the patient is dehydrated from his recent bout of diarrhea from the oral contrast. He will proceed with single agent Keytruda today. We will arrange for him to receive additional IVF today for rehydration.   We will see him back for a follow up visit in 3 weeks for evaluation before starting cycle #45.   The patient was advised to call immediately if he has any concerning symptoms in the interval. The patient voices understanding of current disease status and treatment options and is in agreement with the current care plan. All questions were answered. The patient knows to call the clinic with any problems, questions or concerns. We can certainly see the patient much sooner if necessary   No orders of the defined types were placed in this encounter.   William Eckardt L Mirelle Biskup, PA-C 07/26/20  ADDENDUM: Hematology/Oncology Attending: I had a face-to-face encounter with the patient today.  I reviewed his record, scan and recommended his care plan.  This is a very pleasant 65 years old African-American male with metastatic non-small cell lung  cancer status post induction systemic chemotherapy with carboplatin, Alimta and Keytruda with partial response followed by maintenance treatment with a combination of Alimta and Keytruda before his treatment with Beryle Flock was discontinued secondary to renal insufficiency. The patient is currently undergoing maintenance treatment with single agent Keytruda status post 43 cycles of total treatment.  He has been tolerating this treatment well with no concerning adverse effects. He had repeat CT scan of the chest, abdomen pelvis performed recently.  I personally and independently reviewed the scan images and discussed the results with the patient today. His scan showed no concerning findings for disease progression except for few tiny pulmonary nodules that need close monitoring on the upcoming imaging studies. I recommended for the patient to continue his current treatment with maintenance Keytruda for now.  His renal insufficiency are much worse because of diarrhea for several days after the oral contrast.  We will arrange for the patient to receive IV hydration in the clinic and also encouraged him to increase his oral hydration at home. The patient will come back for follow-up visit in 3 weeks for evaluation before the next cycle of his treatment. He was advised to call immediately if he has any concerning symptoms in the interval.  Disclaimer: This note was dictated with voice recognition software. Similar sounding words can inadvertently be transcribed and may be missed upon review. Eilleen Kempf, MD 07/28/20

## 2020-07-28 ENCOUNTER — Other Ambulatory Visit: Payer: Self-pay

## 2020-07-28 ENCOUNTER — Inpatient Hospital Stay: Payer: Federal, State, Local not specified - PPO

## 2020-07-28 ENCOUNTER — Inpatient Hospital Stay: Payer: Federal, State, Local not specified - PPO | Attending: Internal Medicine

## 2020-07-28 ENCOUNTER — Inpatient Hospital Stay (HOSPITAL_BASED_OUTPATIENT_CLINIC_OR_DEPARTMENT_OTHER): Payer: Federal, State, Local not specified - PPO | Admitting: Physician Assistant

## 2020-07-28 VITALS — BP 113/66

## 2020-07-28 VITALS — BP 109/63 | HR 72 | Temp 97.0°F | Resp 17 | Ht 75.5 in | Wt 145.3 lb

## 2020-07-28 DIAGNOSIS — Z5112 Encounter for antineoplastic immunotherapy: Secondary | ICD-10-CM | POA: Insufficient documentation

## 2020-07-28 DIAGNOSIS — Z79899 Other long term (current) drug therapy: Secondary | ICD-10-CM | POA: Insufficient documentation

## 2020-07-28 DIAGNOSIS — Z95828 Presence of other vascular implants and grafts: Secondary | ICD-10-CM

## 2020-07-28 DIAGNOSIS — C3492 Malignant neoplasm of unspecified part of left bronchus or lung: Secondary | ICD-10-CM

## 2020-07-28 DIAGNOSIS — C7951 Secondary malignant neoplasm of bone: Secondary | ICD-10-CM | POA: Diagnosis not present

## 2020-07-28 DIAGNOSIS — E860 Dehydration: Secondary | ICD-10-CM

## 2020-07-28 DIAGNOSIS — R7989 Other specified abnormal findings of blood chemistry: Secondary | ICD-10-CM | POA: Diagnosis not present

## 2020-07-28 DIAGNOSIS — C3432 Malignant neoplasm of lower lobe, left bronchus or lung: Secondary | ICD-10-CM

## 2020-07-28 DIAGNOSIS — C3412 Malignant neoplasm of upper lobe, left bronchus or lung: Secondary | ICD-10-CM | POA: Diagnosis not present

## 2020-07-28 LAB — CBC WITH DIFFERENTIAL (CANCER CENTER ONLY)
Abs Immature Granulocytes: 0.04 10*3/uL (ref 0.00–0.07)
Basophils Absolute: 0.1 10*3/uL (ref 0.0–0.1)
Basophils Relative: 1 %
Eosinophils Absolute: 0.4 10*3/uL (ref 0.0–0.5)
Eosinophils Relative: 6 %
HCT: 41.4 % (ref 39.0–52.0)
Hemoglobin: 13.8 g/dL (ref 13.0–17.0)
Immature Granulocytes: 1 %
Lymphocytes Relative: 22 %
Lymphs Abs: 1.6 10*3/uL (ref 0.7–4.0)
MCH: 31.7 pg (ref 26.0–34.0)
MCHC: 33.3 g/dL (ref 30.0–36.0)
MCV: 95.2 fL (ref 80.0–100.0)
Monocytes Absolute: 1.1 10*3/uL — ABNORMAL HIGH (ref 0.1–1.0)
Monocytes Relative: 16 %
Neutro Abs: 3.8 10*3/uL (ref 1.7–7.7)
Neutrophils Relative %: 54 %
Platelet Count: 329 10*3/uL (ref 150–400)
RBC: 4.35 MIL/uL (ref 4.22–5.81)
RDW: 13.1 % (ref 11.5–15.5)
WBC Count: 7 10*3/uL (ref 4.0–10.5)
nRBC: 0 % (ref 0.0–0.2)

## 2020-07-28 LAB — CMP (CANCER CENTER ONLY)
ALT: 10 U/L (ref 0–44)
AST: 22 U/L (ref 15–41)
Albumin: 3.2 g/dL — ABNORMAL LOW (ref 3.5–5.0)
Alkaline Phosphatase: 85 U/L (ref 38–126)
Anion gap: 8 (ref 5–15)
BUN: 22 mg/dL (ref 8–23)
CO2: 18 mmol/L — ABNORMAL LOW (ref 22–32)
Calcium: 8.6 mg/dL — ABNORMAL LOW (ref 8.9–10.3)
Chloride: 105 mmol/L (ref 98–111)
Creatinine: 2.09 mg/dL — ABNORMAL HIGH (ref 0.61–1.24)
GFR, Estimated: 34 mL/min — ABNORMAL LOW (ref >60)
Glucose, Bld: 95 mg/dL (ref 70–99)
Potassium: 4.6 mmol/L (ref 3.5–5.1)
Sodium: 131 mmol/L — ABNORMAL LOW (ref 135–145)
Total Bilirubin: 0.5 mg/dL (ref 0.3–1.2)
Total Protein: 7.1 g/dL (ref 6.5–8.1)

## 2020-07-28 LAB — TSH: TSH: 3.936 u[IU]/mL (ref 0.320–4.118)

## 2020-07-28 MED ORDER — SODIUM CHLORIDE 0.9 % IV SOLN
Freq: Once | INTRAVENOUS | Status: AC
  Filled 2020-07-28: qty 250

## 2020-07-28 MED ORDER — SODIUM CHLORIDE 0.9% FLUSH
10.0000 mL | Freq: Once | INTRAVENOUS | Status: AC
  Administered 2020-07-28: 10 mL
  Filled 2020-07-28: qty 10

## 2020-07-28 MED ORDER — SODIUM CHLORIDE 0.9% FLUSH
10.0000 mL | INTRAVENOUS | Status: DC | PRN
  Administered 2020-07-28: 10 mL
  Filled 2020-07-28: qty 10

## 2020-07-28 MED ORDER — HEPARIN SOD (PORK) LOCK FLUSH 100 UNIT/ML IV SOLN
500.0000 [IU] | Freq: Once | INTRAVENOUS | Status: AC | PRN
  Administered 2020-07-28: 500 [IU]
  Filled 2020-07-28: qty 5

## 2020-07-28 MED ORDER — SODIUM CHLORIDE 0.9 % IV SOLN
200.0000 mg | Freq: Once | INTRAVENOUS | Status: AC
  Administered 2020-07-28: 200 mg via INTRAVENOUS
  Filled 2020-07-28: qty 8

## 2020-07-28 NOTE — Progress Notes (Signed)
Per Cassie Heilingoetter, PA, ok to treat with elevated creatinine. Will only receive pembrolizumab today, no premeds, as well as additional IVF.

## 2020-07-28 NOTE — Patient Instructions (Signed)
[facility] Discharge Instructions for Patients Receiving Chemotherapy  Today you received the following chemotherapy agents: pembroliuzmab.  To help prevent nausea and vomiting after your treatment, we encourage you to take your nausea medication as directed.   If you develop nausea and vomiting that is not controlled by your nausea medication, call the clinic.   BELOW ARE SYMPTOMS THAT SHOULD BE REPORTED IMMEDIATELY:  *FEVER GREATER THAN 100.5 F  *CHILLS WITH OR WITHOUT FEVER  NAUSEA AND VOMITING THAT IS NOT CONTROLLED WITH YOUR NAUSEA MEDICATION  *UNUSUAL SHORTNESS OF BREATH  *UNUSUAL BRUISING OR BLEEDING  TENDERNESS IN MOUTH AND THROAT WITH OR WITHOUT PRESENCE OF ULCERS  *URINARY PROBLEMS  *BOWEL PROBLEMS  UNUSUAL RASH Items with * indicate a potential emergency and should be followed up as soon as possible.  Feel free to call the clinic should you have any questions or concerns. The clinic phone number is [phone].  Please show the [facility] at check-in to the Emergency Department and triage nurse.

## 2020-07-29 ENCOUNTER — Telehealth: Payer: Self-pay | Admitting: Physician Assistant

## 2020-07-29 NOTE — Telephone Encounter (Signed)
Scheduled per los. Called, not able to leave msg. Mailed printout  

## 2020-08-01 ENCOUNTER — Telehealth: Payer: Self-pay | Admitting: Internal Medicine

## 2020-08-01 NOTE — Telephone Encounter (Signed)
Scheduled per los. Called and spoke with ptaient. Confirmed appts

## 2020-08-11 ENCOUNTER — Other Ambulatory Visit: Payer: Medicare Other

## 2020-08-11 ENCOUNTER — Ambulatory Visit: Payer: Medicare Other | Admitting: Internal Medicine

## 2020-08-11 ENCOUNTER — Ambulatory Visit: Payer: Medicare Other

## 2020-08-15 NOTE — Progress Notes (Signed)
[facility] OFFICE PROGRESS NOTE  [name], [address] [place] [id number]  DIAGNOSIS: Metastatic non-small cell lung cancer, adenocarcinoma initially diagnosed as stage IB (T2a, N0, M0) non-small cell lung cancer, adenocarcinoma diagnosed in December 2017. The patient has evidence for disease recurrence in July 2019.  Biomarker Findings Tumor Mutational Burden - TMB-Intermediate (16 Muts/Mb) Microsatellite status - MS-Stable Genomic Findings For a complete list of the genes assayed, please refer to the Appendix. KIT amplification KRAS Y07P SMAD4 splice site [id number]>R SW54 I232F 7 Disease relevant genes with no reportable alterations: EGFR, ALK, BRAF, MET, RET, ERBB2, ROS1   PDL 1 expression is 0%  PRIOR THERAPY:  1) status post left lower lobectomy as well as wedge resection of the left upper lobe on 05/11/2016. 2) Adjuvant systemic chemotherapy with cisplatin 75 MG/M2 and Alimta 500 MG/M2 every 3 weeks. First dose 07/03/2016. Status post 4 cycles.  CURRENT THERAPY: Systemic chemotherapy with carboplatin for AUC of 5, Alimta 500 mg/M2 and Keytruda 200 mg IV every 3 weeks. Status post44cycles. Starting from cycle #5 the patient will be treated with maintenance Alimta and Ketruda (pembrolizumab) every 3 weeks. Alimta occasionally needs to be held due to renal insufficiency.  INTERVAL HISTORY: William Kales [name] 65 y.o. male returns to the clinic today forafollow-up visit.The patient is feeling fine today without any concerning complaints except for a slight rash on his forehead. It does not itch or bother him. He states he was outside gardening and had a hat on and developed a rash. He continues to tolerate his maintenance systemic chemotherapy with Alimta and Keytruda fairly well.Alimta is held periodically and was held for his last cycle of treatment due to renal insuffiencey.He denied having any chest pain, cough, or hemoptysis. He reports the  same shortness of breath with exertion.He denied having any fever or chills. He has no nausea, vomiting, or constipation. He denied having any headache or visual changes. The patient is here today for evaluation before starting cycle #45.  MEDICAL HISTORY: Past Medical History:  Diagnosis Date  . AAA (abdominal aortic aneurysm) ([facility])   . AAA (abdominal aortic aneurysm) without rupture ([facility]) 02/04/2014  . Cancer ([facility])    lung  . Encounter for antineoplastic chemotherapy 06/06/2016  . History of hepatitis B   . HNP (herniated nucleus pulposus), lumbar    L4 with radiculopathy  . Hypertension   . Lung cancer ([facility]) 05/18/2016  . Malignant neoplasm of lower lobe of left lung ([facility]) 04/05/2016  . Mass of left lung   . Mitral insufficiency 04/06/2016   This patient will eventually need MV repair if his prognosis is good from oncology standpoint. However, his lung cancer therapy  is the priority right now. His cardiac condition won't preclude possible lung surgery.  Once his lung cancer is under control he will need a TEE to further evaluate his mitral valve anatomy and MR severity, and also have ischemic workup as tere is evidence on calcificati  . Renal insufficiency 10/09/2016  . S/P partial lobectomy of lung 05/11/2016  . Varicose veins of legs     ALLERGIES:  is allergic to tramadol.  MEDICATIONS:  Current Outpatient Medications  Medication Sig Dispense Refill  . carvedilol (COREG) 6.25 MG tablet Take 1 tablet (6.25 mg total) by mouth 2 (two) times daily. 180 tablet 3  . lisinopril (ZESTRIL) 2.5 MG tablet TAKE 1 TABLET BY MOUTH EVERY DAY 90 tablet 3  . folic acid (FOLVITE) 1 MG  tablet Take 1 tablet (1 mg total) by mouth daily. 90 tablet 1   No current facility-administered medications for this visit.    SURGICAL HISTORY:  Past Surgical History:  Procedure Laterality Date  . ABDOMINAL AORTIC ENDOVASCULAR STENT GRAFT N/A 03/12/2016   Procedure: ABDOMINAL AORTIC ENDOVASCULAR STENT  GRAFT;  Surgeon: Conrad Gibsland, MD;  Location: [facility];  Service: Vascular;  Laterality: N/A;  . COLONOSCOPY    . INGUINAL HERNIA REPAIR Left 1975   Left inguinal hernia  . INGUINAL HERNIA REPAIR Right   . IR IMAGING GUIDED PORT INSERTION  09/04/2019  . LOBECTOMY Left 05/11/2016   Procedure: LEFT LOWER LOBE LOBECTOMY AND LEFT UPPER LOBE  RESECTION AND PLACEMENT OF ON-Q;  Surgeon: [name], MD;  Location: [facility];  Service: Thoracic;  Laterality: Left;  . LUMBAR LAMINECTOMY  October 22, 2012  . LYMPH NODE DISSECTION Left 05/11/2016   Procedure: LYMPH NODE DISSECTION;  Surgeon: [name], MD;  Location: [facility];  Service: Thoracic;  Laterality: Left;  . RIGHT/LEFT HEART CATH AND CORONARY ANGIOGRAPHY N/A 03/10/2020   Procedure: RIGHT/LEFT HEART CATH AND CORONARY ANGIOGRAPHY;  Surgeon: Sherren Mocha, MD;  Location: [facility];  Service: Cardiovascular;  Laterality: N/A;  . STAPLING OF BLEBS Left 05/11/2016   Procedure: STAPLING OF APICAL BLEB;  Surgeon: [name], MD;  Location: [facility];  Service: Thoracic;  Laterality: Left;  . TEE WITHOUT CARDIOVERSION N/A 10/07/2019   Procedure: TRANSESOPHAGEAL ECHOCARDIOGRAM (TEE);  Surgeon: Dorothy Spark, MD;  Location: [facility] ENDOSCOPY;  Service: Cardiovascular;  Laterality: N/A;  . VIDEO ASSISTED THORACOSCOPY Left 05/18/2016   Procedure: VIDEO ASSISTED THORACOSCOPY WITH REMOVAL OF LEFT APICAL BLEB;  Surgeon: [name], MD;  Location: [facility];  Service: Thoracic;  Laterality: Left;  Marland Kitchen VIDEO ASSISTED THORACOSCOPY (VATS)/WEDGE RESECTION Left 05/11/2016   Procedure: LEFT VIDEO ASSISTED THORACOSCOPY (VATS);  Surgeon: [name], MD;  Location: [facility];  Service: Thoracic;  Laterality: Left;  Marland Kitchen VIDEO BRONCHOSCOPY N/A 05/11/2016   Procedure: VIDEO BRONCHOSCOPY, LEFT LUNG;  Surgeon: [name], MD;  Location: [facility];  Service: Thoracic;  Laterality: N/A;  . VIDEO BRONCHOSCOPY N/A 05/18/2016   Procedure: VIDEO BRONCHOSCOPY WITH  BRONCHIAL WASHING;  Surgeon: [name], MD;  Location: [facility];  Service: Thoracic;  Laterality: N/A;    REVIEW OF SYSTEMS:   Review of Systems  Constitutional: Negative for appetite change, chills, fatigue, fever and unexpected weight change.  HENT: Negative for mouth sores, nosebleeds, sore throat and trouble swallowing.  Eyes: Negative for eye problems and icterus.  Respiratory: Negative for cough, hemoptysis, shortness of breath and wheezing.  Cardiovascular: Negative for chest pain and leg swelling.  Gastrointestinal:  Negative for abdominal pain, diarrhea, constipation, nausea and vomiting.  Genitourinary: Negative for bladder incontinence, difficulty urinating, dysuria, frequency and hematuria.  Musculoskeletal: Negative for back pain, gait problem, neck pain and neck stiffness.  Skin: Negative for itching and rash.  Neurological: Negative for dizziness, extremity weakness, gait problem, headaches, light-headedness and seizures.  Hematological: Negative for adenopathy. Does not bruise/bleed easily.  Psychiatric/Behavioral: Negative for confusion, depression and sleep disturbance. The patient is not nervous/anxious.   PHYSICAL EXAMINATION:  Blood pressure 123/75, pulse 86, temperature 97.6 F (36.4 C), temperature source Tympanic, resp. rate 18, height $RemoveBe'6\' 3"'SoOEcGGml$  (1.905 m), weight 145 lb 12.8 oz (66.1 kg), SpO2 96 %.  ECOG PERFORMANCE STATUS: 1 - Symptomatic but completely ambulatory  Physical Exam  Constitutional: Oriented to person, place, and  time and well-developed, well-nourished, and in no distress.  HENT:  Head: Normocephalic and atraumatic.  Mouth/Throat: Oropharynx is clear and moist. No oropharyngeal exudate.  Eyes: Conjunctivae are normal. Right eye exhibits no discharge. Left eye exhibits no discharge. No scleral icterus.  Neck: Normal range of motion. Neck supple.  Cardiovascular:Systolic murmur auscultatedin hisposterior left thorax. Normal rate, regular  rhythm and intact distal pulses.  Pulmonary/Chest: Effort normal and breath sounds normal. No respiratory distress. No wheezes. No rales.  Abdominal: Soft. Bowel sounds are normal. Exhibits no distension and no mass. There is no tenderness.  Musculoskeletal: Normal range of motion. Exhibits no edema.  Lymphadenopathy:  No cervical adenopathy.  Neurological: Alert and oriented to person, place, and time. Exhibits normal muscle tone. Gait normal. Coordination normal.  Skin: Skin is warm and dry. No rash noted. Not diaphoretic. No erythema. No pallor.  Psychiatric: Mood, memory and judgment normal.  Vitals reviewed  LABORATORY DATA: Lab Results  Component Value Date   WBC 8.5 08/18/2020   HGB 14.8 08/18/2020   HCT 46.3 08/18/2020   MCV 94.3 08/18/2020   PLT 287 08/18/2020      Chemistry      Component Value Date/Time   NA 131 (L) 07/28/2020 0837   NA 132 (L) 10/05/2019 0830   NA 138 01/11/2017 1343   K 4.6 07/28/2020 0837   K 4.7 01/11/2017 1343   CL 105 07/28/2020 0837   CO2 18 (L) 07/28/2020 0837   CO2 23 01/11/2017 1343   BUN 22 07/28/2020 0837   BUN 14 10/05/2019 0830   BUN 20.2 01/11/2017 1343   CREATININE 2.09 (H) 07/28/2020 0837   CREATININE 1.6 (H) 01/11/2017 1343      Component Value Date/Time   CALCIUM 8.6 (L) 07/28/2020 0837   CALCIUM 9.3 01/11/2017 1343   ALKPHOS 85 07/28/2020 0837   ALKPHOS 89 01/11/2017 1343   AST 22 07/28/2020 0837   AST 41 (H) 01/11/2017 1343   ALT 10 07/28/2020 0837   ALT 27 01/11/2017 1343   BILITOT 0.5 07/28/2020 0837   BILITOT 0.49 01/11/2017 1343       RADIOGRAPHIC STUDIES:  No results found.   ASSESSMENT/PLAN:  This is a very pleasant 65year old [place] American male with metastatic disease who was initially diagnosed with stage IB non-small cell lung cancer, adenocarcinoma of the left upper lobe. Molecular studies showed KRAS G12C mutation which can be targetted in the second line setting in the future. PD-L1  expression was negative. First diagnosed December 2017.  He was status post a wedge resection of the left upper lobe followed by 4 cycles of adjuvant systemic chemotherapy with cisplatin and Alimta. He tolerated treatment well except for fatigue.  He had been on observationuntilJune of 2018.  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  Theis currently undergoingsystemic chemotherapy with carboplatin, alimta, and keytruda. He is status post44cycles. Starting from cycle #5, he has been on maintenance Alimta and Keytruda every 3 weeks. His Alimta has been held during a few cycles of treatment due to renal insufficiency.   Labs were reviewed. Recommend he proceed with single agent immunotherapy with Beryle Flock today due to his creatinine.   I will arrange for restaging CT scan at his next appointment due to the indeterminate findings on his last CT scan.   We will see him back for a follow up  visit in 3 weeks for evaluation before starting cycle #46.   I sent a refill of folic acid to his pharmacy.   The patient was advised to call immediately if he has any concerning symptoms in the interval. The patient voices understanding of current disease status and treatment options and is in agreement with the current care plan. All questions were answered. The patient knows to call the clinic with any problems, questions or concerns. We can certainly see the patient much sooner if necessary   No orders of the defined types were placed in this encounter.    I spent 20-29 minutes in this encounter.   [place] Truett L Kingsten Enfield, PA-C 08/18/20

## 2020-08-17 MED FILL — Dexamethasone Sodium Phosphate Inj 100 MG/10ML: INTRAMUSCULAR | Qty: 1 | Status: AC

## 2020-08-18 ENCOUNTER — Other Ambulatory Visit: Payer: Medicare Other

## 2020-08-18 ENCOUNTER — Other Ambulatory Visit: Payer: Self-pay

## 2020-08-18 ENCOUNTER — Inpatient Hospital Stay (HOSPITAL_BASED_OUTPATIENT_CLINIC_OR_DEPARTMENT_OTHER): Payer: Federal, State, Local not specified - PPO | Admitting: Physician Assistant

## 2020-08-18 ENCOUNTER — Inpatient Hospital Stay: Payer: Federal, State, Local not specified - PPO

## 2020-08-18 VITALS — BP 123/75 | HR 86 | Temp 97.6°F | Resp 18 | Ht 75.0 in | Wt 145.8 lb

## 2020-08-18 DIAGNOSIS — Z79899 Other long term (current) drug therapy: Secondary | ICD-10-CM | POA: Diagnosis not present

## 2020-08-18 DIAGNOSIS — C7951 Secondary malignant neoplasm of bone: Secondary | ICD-10-CM | POA: Diagnosis not present

## 2020-08-18 DIAGNOSIS — C3492 Malignant neoplasm of unspecified part of left bronchus or lung: Secondary | ICD-10-CM

## 2020-08-18 DIAGNOSIS — C3412 Malignant neoplasm of upper lobe, left bronchus or lung: Secondary | ICD-10-CM | POA: Diagnosis not present

## 2020-08-18 DIAGNOSIS — E860 Dehydration: Secondary | ICD-10-CM | POA: Diagnosis not present

## 2020-08-18 DIAGNOSIS — Z5112 Encounter for antineoplastic immunotherapy: Secondary | ICD-10-CM | POA: Diagnosis not present

## 2020-08-18 DIAGNOSIS — Z95828 Presence of other vascular implants and grafts: Secondary | ICD-10-CM

## 2020-08-18 DIAGNOSIS — C3432 Malignant neoplasm of lower lobe, left bronchus or lung: Secondary | ICD-10-CM

## 2020-08-18 LAB — CBC WITH DIFFERENTIAL (CANCER CENTER ONLY)
Abs Immature Granulocytes: 0.03 10*3/uL (ref 0.00–0.07)
Basophils Absolute: 0.1 10*3/uL (ref 0.0–0.1)
Basophils Relative: 1 %
Eosinophils Absolute: 0.4 10*3/uL (ref 0.0–0.5)
Eosinophils Relative: 5 %
HCT: 46.3 % (ref 39.0–52.0)
Hemoglobin: 14.8 g/dL (ref 13.0–17.0)
Immature Granulocytes: 0 %
Lymphocytes Relative: 14 %
Lymphs Abs: 1.2 10*3/uL (ref 0.7–4.0)
MCH: 30.1 pg (ref 26.0–34.0)
MCHC: 32 g/dL (ref 30.0–36.0)
MCV: 94.3 fL (ref 80.0–100.0)
Monocytes Absolute: 1.1 10*3/uL — ABNORMAL HIGH (ref 0.1–1.0)
Monocytes Relative: 13 %
Neutro Abs: 5.7 10*3/uL (ref 1.7–7.7)
Neutrophils Relative %: 67 %
Platelet Count: 287 10*3/uL (ref 150–400)
RBC: 4.91 MIL/uL (ref 4.22–5.81)
RDW: 13.3 % (ref 11.5–15.5)
WBC Count: 8.5 10*3/uL (ref 4.0–10.5)
nRBC: 0 % (ref 0.0–0.2)

## 2020-08-18 LAB — CMP (CANCER CENTER ONLY)
ALT: 6 U/L (ref 0–44)
AST: 23 U/L (ref 11–38)
Albumin: 3.1 g/dL — ABNORMAL LOW (ref 3.5–5.0)
Alkaline Phosphatase: 95 U/L (ref 38–126)
Anion gap: 7 (ref 5–15)
BUN: 17 mg/dL (ref 8–23)
CO2: 22 mmol/L (ref 22–32)
Calcium: 9 mg/dL (ref 8.9–10.3)
Chloride: 105 mmol/L (ref 98–111)
Creatinine: 1.83 mg/dL — ABNORMAL HIGH (ref 0.60–1.20)
GFR, Estimated: 40 mL/min — ABNORMAL LOW (ref >60)
Glucose, Bld: 95 mg/dL (ref 70–99)
Potassium: 4.2 mmol/L (ref 3.5–5.1)
Sodium: 134 mmol/L — ABNORMAL LOW (ref 135–145)
Total Bilirubin: 0.5 mg/dL (ref 0.2–1.6)
Total Protein: 7.3 g/dL (ref 6.5–8.1)

## 2020-08-18 LAB — TSH: TSH: 2.012 u[IU]/mL (ref 0.320–4.118)

## 2020-08-18 MED ORDER — SODIUM CHLORIDE 0.9 % IV SOLN
200.0000 mg | Freq: Once | INTRAVENOUS | Status: AC
  Administered 2020-08-18: 200 mg via INTRAVENOUS
  Filled 2020-08-18: qty 8

## 2020-08-18 MED ORDER — CYANOCOBALAMIN 1000 MCG/ML IJ SOLN
INTRAMUSCULAR | Status: AC
  Filled 2020-08-18: qty 1

## 2020-08-18 MED ORDER — HEPARIN SOD (PORK) LOCK FLUSH 100 UNIT/ML IV SOLN
500.0000 [IU] | Freq: Once | INTRAVENOUS | Status: AC | PRN
  Administered 2020-08-18: 500 [IU]
  Filled 2020-08-18: qty 5

## 2020-08-18 MED ORDER — SODIUM CHLORIDE 0.9% FLUSH
10.0000 mL | INTRAVENOUS | Status: DC | PRN
  Administered 2020-08-18: 10 mL
  Filled 2020-08-18: qty 10

## 2020-08-18 MED ORDER — FOLIC ACID 1 MG PO TABS: 1.0000 mg | ORAL_TABLET | Freq: Every day | ORAL | 1 refills | Status: DC

## 2020-08-18 MED ORDER — CYANOCOBALAMIN 1000 MCG/ML IJ SOLN
1000.0000 ug | Freq: Once | INTRAMUSCULAR | Status: AC
  Administered 2020-08-18: 1000 ug via INTRAMUSCULAR

## 2020-08-18 MED ORDER — SODIUM CHLORIDE 0.9 % IV SOLN
Freq: Once | INTRAVENOUS | Status: AC
  Filled 2020-08-18: qty 250

## 2020-08-18 MED ORDER — SODIUM CHLORIDE 0.9% FLUSH
10.0000 mL | Freq: Once | INTRAVENOUS | Status: AC
Start: 2020-08-18 — End: 2020-08-18
  Administered 2020-08-18: 10 mL
  Filled 2020-08-18: qty 10

## 2020-08-18 NOTE — Patient Instructions (Signed)
[facility]COL[facility]  Discharge Instructions: Thank you for choosing [facility] to provide your oncology and hematology care.   If you have a lab appointment with the [facility], please go directly to the [facility] and check in at the registration area.   Wear comfortable clothing and clothing appropriate for easy access to any Portacath or PICC line.   We strive to give you quality time with your provider. You may need to reschedule your appointment if you arrive late (15 or more minutes).  Arriving late affects you and other patients whose appointments are after yours.  Also, if you miss three or more appointments without notifying the office, you may be dismissed from the clinic at the provider's discretion.      For prescription refill requests, have your pharmacy contact our office and allow 72 hours for refills to be completed.    Today you received the following chemotherapy and/or immunotherapy agents [name]    To help prevent nausea and vomiting after your treatment, we encourage you to take your nausea medication as directed.  BELOW ARE SYMPTOMS THAT SHOULD BE REPORTED IMMEDIATELY: . *FEVER GREATER THAN 100.4 F (38 C) OR HIGHER . *CHILLS OR SWEATING . *NAUSEA AND VOMITING THAT IS NOT CONTROLLED WITH YOUR NAUSEA MEDICATION . *UNUSUAL SHORTNESS OF BREATH . *UNUSUAL BRUISING OR BLEEDING . *URINARY PROBLEMS (pain or burning when urinating, or frequent urination) . *BOWEL PROBLEMS (unusual diarrhea, constipation, pain near the anus) . TENDERNESS IN MOUTH AND THROAT WITH OR WITHOUT PRESENCE OF ULCERS (sore throat, sores in mouth, or a toothache) . UNUSUAL RASH, SWELLING OR PAIN  . UNUSUAL VAGINAL DISCHARGE OR ITCHING   Items with * indicate a potential emergency and should be followed up as soon as possible or go to the Emergency Department if any problems should occur.  Please show the CHEMOTHERAPY ALERT CARD or IMMUNOTHERAPY ALERT  CARD at check-in to the Emergency Department and triage nurse.  Should you have questions after your visit or need to cancel or reschedule your appointment, please contact [facility]  Dept: [phone]  and follow the prompts.  Office hours are 8:00 a.m. to 4:30 p.m. Monday - Friday. Please note that voicemails left after 4:00 p.m. may not be returned until the following business day.  We are closed weekends and major holidays. You have access to a nurse at all times for urgent questions. Please call the main number to the clinic Dept: [phone] and follow the prompts.   For any non-urgent questions, you may also contact your provider using MyChart. We now offer e-Visits for anyone 10 and older to request care online for non-urgent symptoms. For details visit [email or web address].   Also download the MyChart app! Go to the app store, search "MyChart", open the app, select [facility], and log in with your MyChart username and password.  Due to Covid, a mask is required upon entering the hospital/clinic. If you do not have a mask, one will be given to you upon arrival. For doctor visits, patients may have 1 support person aged 64 or older with them. For treatment visits, patients cannot have anyone with them due to current Covid guidelines and our immunocompromised population.

## 2020-08-31 DIAGNOSIS — H6123 Impacted cerumen, bilateral: Secondary | ICD-10-CM | POA: Diagnosis not present

## 2020-09-05 DIAGNOSIS — I10 Essential (primary) hypertension: Secondary | ICD-10-CM | POA: Diagnosis not present

## 2020-09-05 DIAGNOSIS — I050 Rheumatic mitral stenosis: Secondary | ICD-10-CM | POA: Diagnosis not present

## 2020-09-05 DIAGNOSIS — R634 Abnormal weight loss: Secondary | ICD-10-CM | POA: Diagnosis not present

## 2020-09-05 DIAGNOSIS — C3490 Malignant neoplasm of unspecified part of unspecified bronchus or lung: Secondary | ICD-10-CM | POA: Diagnosis not present

## 2020-09-07 MED FILL — Dexamethasone Sodium Phosphate Inj 100 MG/10ML: INTRAMUSCULAR | Qty: 1 | Status: AC

## 2020-09-08 ENCOUNTER — Encounter: Payer: Self-pay | Admitting: Internal Medicine

## 2020-09-08 ENCOUNTER — Inpatient Hospital Stay: Payer: Federal, State, Local not specified - PPO

## 2020-09-08 ENCOUNTER — Other Ambulatory Visit: Payer: Self-pay

## 2020-09-08 ENCOUNTER — Other Ambulatory Visit: Payer: Medicare Other

## 2020-09-08 ENCOUNTER — Inpatient Hospital Stay: Payer: Federal, State, Local not specified - PPO | Attending: Internal Medicine | Admitting: Internal Medicine

## 2020-09-08 ENCOUNTER — Telehealth: Payer: Self-pay | Admitting: Medical Oncology

## 2020-09-08 VITALS — BP 115/74 | HR 74 | Temp 97.6°F | Resp 20 | Ht 75.0 in | Wt 147.8 lb

## 2020-09-08 DIAGNOSIS — C3492 Malignant neoplasm of unspecified part of left bronchus or lung: Secondary | ICD-10-CM

## 2020-09-08 DIAGNOSIS — Z5112 Encounter for antineoplastic immunotherapy: Secondary | ICD-10-CM | POA: Diagnosis not present

## 2020-09-08 DIAGNOSIS — Z95828 Presence of other vascular implants and grafts: Secondary | ICD-10-CM

## 2020-09-08 DIAGNOSIS — C7951 Secondary malignant neoplasm of bone: Secondary | ICD-10-CM | POA: Diagnosis not present

## 2020-09-08 DIAGNOSIS — Z79899 Other long term (current) drug therapy: Secondary | ICD-10-CM | POA: Diagnosis not present

## 2020-09-08 DIAGNOSIS — C3412 Malignant neoplasm of upper lobe, left bronchus or lung: Secondary | ICD-10-CM | POA: Diagnosis not present

## 2020-09-08 DIAGNOSIS — C3432 Malignant neoplasm of lower lobe, left bronchus or lung: Secondary | ICD-10-CM

## 2020-09-08 LAB — CBC WITH DIFFERENTIAL (CANCER CENTER ONLY)
Abs Immature Granulocytes: 0.03 10*3/uL (ref 0.00–0.07)
Basophils Absolute: 0.1 10*3/uL (ref 0.0–0.1)
Basophils Relative: 1 %
Eosinophils Absolute: 0.4 10*3/uL (ref 0.0–0.5)
Eosinophils Relative: 6 %
HCT: 45.4 % (ref 39.0–52.0)
Hemoglobin: 14.8 g/dL (ref 13.0–17.0)
Immature Granulocytes: 0 %
Lymphocytes Relative: 14 %
Lymphs Abs: 1 10*3/uL (ref 0.7–4.0)
MCH: 29.5 pg (ref 26.0–34.0)
MCHC: 32.6 g/dL (ref 30.0–36.0)
MCV: 90.6 fL (ref 80.0–100.0)
Monocytes Absolute: 1.1 10*3/uL — ABNORMAL HIGH (ref 0.1–1.0)
Monocytes Relative: 15 %
Neutro Abs: 4.6 10*3/uL (ref 1.7–7.7)
Neutrophils Relative %: 64 %
Platelet Count: 248 10*3/uL (ref 150–400)
RBC: 5.01 MIL/uL (ref 4.22–5.81)
RDW: 13.2 % (ref 11.5–15.5)
WBC Count: 7.2 10*3/uL (ref 4.0–10.5)
nRBC: 0 % (ref 0.0–0.2)

## 2020-09-08 LAB — CMP (CANCER CENTER ONLY)
ALT: <6 U/L (ref 0–44)
AST: 20 U/L (ref 15–41)
Albumin: 3 g/dL — ABNORMAL LOW (ref 3.5–5.0)
Alkaline Phosphatase: 81 U/L (ref 38–126)
Anion gap: 8 (ref 5–15)
BUN: 17 mg/dL (ref 8–23)
CO2: 22 mmol/L (ref 22–32)
Calcium: 9.1 mg/dL (ref 8.9–10.3)
Chloride: 104 mmol/L (ref 98–111)
Creatinine: 1.78 mg/dL — ABNORMAL HIGH (ref 0.61–1.24)
GFR, Estimated: 41 mL/min — ABNORMAL LOW (ref >60)
Glucose, Bld: 93 mg/dL (ref 70–99)
Potassium: 4.3 mmol/L (ref 3.5–5.1)
Sodium: 134 mmol/L — ABNORMAL LOW (ref 135–145)
Total Bilirubin: 0.5 mg/dL (ref 0.3–1.2)
Total Protein: 6.9 g/dL (ref 6.5–8.1)

## 2020-09-08 LAB — TSH: TSH: 1.831 u[IU]/mL (ref 0.320–4.118)

## 2020-09-08 MED ORDER — HEPARIN SOD (PORK) LOCK FLUSH 100 UNIT/ML IV SOLN
500.0000 [IU] | Freq: Once | INTRAVENOUS | Status: AC | PRN
  Administered 2020-09-08: 500 [IU]
  Filled 2020-09-08: qty 5

## 2020-09-08 MED ORDER — SODIUM CHLORIDE 0.9% FLUSH
10.0000 mL | INTRAVENOUS | Status: DC | PRN
  Administered 2020-09-08: 10 mL
  Filled 2020-09-08: qty 10

## 2020-09-08 MED ORDER — SODIUM CHLORIDE 0.9 % IV SOLN
Freq: Once | INTRAVENOUS | Status: AC
  Filled 2020-09-08: qty 250

## 2020-09-08 MED ORDER — SODIUM CHLORIDE 0.9 % IV SOLN
200.0000 mg | Freq: Once | INTRAVENOUS | Status: AC
  Administered 2020-09-08: 200 mg via INTRAVENOUS
  Filled 2020-09-08: qty 8

## 2020-09-08 MED ORDER — SODIUM CHLORIDE 0.9% FLUSH
10.0000 mL | Freq: Once | INTRAVENOUS | Status: AC
  Administered 2020-09-08: 10 mL
  Filled 2020-09-08: qty 10

## 2020-09-08 NOTE — Progress Notes (Signed)
Per Dr. Julien Nordmann, patient will receive Keytruda only today.  No Alimta.

## 2020-09-08 NOTE — Progress Notes (Signed)
[facility] [phone]   Fax:([phone]  OFFICE PROGRESS NOTE  [name], MD [address] [place] [address]  DIAGNOSIS: Metastatic non-small cell lung cancer, adenocarcinoma initially diagnosed as stage IB (T2a, N0, M0) non-small cell lung cancer, adenocarcinoma diagnosed in December 2017.  The patient has evidence for disease recurrence in July 2019.  Biomarker Findings Tumor Mutational Burden - TMB-Intermediate (16 Muts/Mb) Microsatellite status - MS-Stable Genomic Findings For a complete list of the genes assayed, please refer to the Appendix. KIT amplification KRAS C48G SMAD4 splice site 8916-9I>H WT88 I232F 7 Disease relevant genes with no reportable alterations: EGFR, ALK, BRAF, MET, RET, ERBB2, ROS1   PDL 1 expression is 0%  PRIOR THERAPY:  1) status post left lower lobectomy as well as wedge resection of the left upper lobe on 05/11/2016. 2) Adjuvant systemic chemotherapy with cisplatin 75 MG/M2 and Alimta 500 MG/M2 every 3 weeks. First dose 07/03/2016. Status post 4 cycles.  CURRENT THERAPY: Systemic chemotherapy with carboplatin for AUC of 5, Alimta 500 mg/M2 and Keytruda 200 mg IV every 3 weeks.  Status post 45 cycles.  Starting from cycle #5 the patient will be treated with maintenance Alimta and Ketruda (pembrolizumab) every 3 weeks.  INTERVAL HISTORY: [name] [name] 65 y.o. male returns to the clinic today for follow-up visit.  The patient is feeling fine today with no concerning complaints.  He denied having any current chest pain, shortness of breath, cough or hemoptysis.  He denied having any fever or chills.  He has no nausea, vomiting, diarrhea or constipation.  He denied having any headache or visual changes.  He continues to tolerate his treatment with single agent Keytruda fairly well.  The patient is here today for evaluation before starting cycle #46.  MEDICAL HISTORY: Past Medical History:   Diagnosis Date  . AAA (abdominal aortic aneurysm) ([place])   . AAA (abdominal aortic aneurysm) without rupture ([place]) 02/04/2014  . Cancer ([place])    lung  . Encounter for antineoplastic chemotherapy 06/06/2016  . History of hepatitis B   . HNP (herniated nucleus pulposus), lumbar    L4 with radiculopathy  . Hypertension   . Lung cancer ([facility]) 05/18/2016  . Malignant neoplasm of lower lobe of left lung ([facility]) 04/05/2016  . Mass of left lung   . Mitral insufficiency 04/06/2016   This patient will eventually need MV repair if his prognosis is good from oncology standpoint. However, his lung cancer therapy  is the priority right now. His cardiac condition won't preclude possible lung surgery.  Once his lung cancer is under control he will need a TEE to further evaluate his mitral valve anatomy and MR severity, and also have ischemic workup as tere is evidence on calcificati  . Renal insufficiency 10/09/2016  . S/P partial lobectomy of lung 05/11/2016  . Varicose veins of legs     ALLERGIES:  is allergic to tramadol.  MEDICATIONS:  Current Outpatient Medications  Medication Sig Dispense Refill  . carvedilol (COREG) 6.25 MG tablet Take 1 tablet (6.25 mg total) by mouth 2 (two) times daily. 828 tablet 3  . folic acid (FOLVITE) 1 MG tablet Take 1 tablet (1 mg total) by mouth daily. 90 tablet 1  . lisinopril (ZESTRIL) 2.5 MG tablet TAKE 1 TABLET BY MOUTH EVERY DAY 90 tablet 3   No current facility-administered medications for this visit.    SURGICAL HISTORY:  Past Surgical History:  Procedure Laterality Date  . ABDOMINAL AORTIC ENDOVASCULAR STENT GRAFT N/A 03/12/2016   Procedure: ABDOMINAL AORTIC ENDOVASCULAR STENT GRAFT;  Surgeon: Conrad Amada Acres, MD;  Location: [facility];  Service: Vascular;  Laterality: N/A;  . COLONOSCOPY    . INGUINAL HERNIA REPAIR Left 1975   Left inguinal hernia  . INGUINAL HERNIA REPAIR Right   . IR IMAGING GUIDED PORT INSERTION  09/04/2019  . LOBECTOMY Left 05/11/2016    Procedure: LEFT LOWER LOBE LOBECTOMY AND LEFT UPPER LOBE  RESECTION AND PLACEMENT OF ON-Q;  Surgeon: [name], MD;  Location: [facility];  Service: Thoracic;  Laterality: Left;  . LUMBAR LAMINECTOMY  October 22, 2012  . LYMPH NODE DISSECTION Left 05/11/2016   Procedure: LYMPH NODE DISSECTION;  Surgeon: [name], MD;  Location: [facility];  Service: Thoracic;  Laterality: Left;  . RIGHT/LEFT HEART CATH AND CORONARY ANGIOGRAPHY N/A 03/10/2020   Procedure: RIGHT/LEFT HEART CATH AND CORONARY ANGIOGRAPHY;  Surgeon: Sherren Mocha, MD;  Location: [facility];  Service: Cardiovascular;  Laterality: N/A;  . STAPLING OF BLEBS Left 05/11/2016   Procedure: STAPLING OF APICAL BLEB;  Surgeon: [name], MD;  Location: [facility];  Service: Thoracic;  Laterality: Left;  . TEE WITHOUT CARDIOVERSION N/A 10/07/2019   Procedure: TRANSESOPHAGEAL ECHOCARDIOGRAM (TEE);  Surgeon: Dorothy Spark, MD;  Location: [facility];  Service: Cardiovascular;  Laterality: N/A;  . VIDEO ASSISTED THORACOSCOPY Left 05/18/2016   Procedure: VIDEO ASSISTED THORACOSCOPY WITH REMOVAL OF LEFT APICAL BLEB;  Surgeon: [name], MD;  Location: [facility];  Service: Thoracic;  Laterality: Left;  Marland Kitchen VIDEO ASSISTED THORACOSCOPY (VATS)/WEDGE RESECTION Left 05/11/2016   Procedure: LEFT VIDEO ASSISTED THORACOSCOPY (VATS);  Surgeon: [name], MD;  Location: [facility];  Service: Thoracic;  Laterality: Left;  Marland Kitchen VIDEO BRONCHOSCOPY N/A 05/11/2016   Procedure: VIDEO BRONCHOSCOPY, LEFT LUNG;  Surgeon: [name], MD;  Location: [facility];  Service: Thoracic;  Laterality: N/A;  . VIDEO BRONCHOSCOPY N/A 05/18/2016   Procedure: VIDEO BRONCHOSCOPY WITH BRONCHIAL WASHING;  Surgeon: [name], MD;  Location: [facility];  Service: Thoracic;  Laterality: N/A;    REVIEW OF SYSTEMS:  A comprehensive review of systems was negative.   PHYSICAL EXAMINATION: General appearance: alert, cooperative and no distress Head: Normocephalic,  without obvious abnormality, atraumatic Neck: no adenopathy, no JVD, supple, symmetrical, trachea midline and thyroid not enlarged, symmetric, no tenderness/mass/nodules Lymph nodes: Cervical, supraclavicular, and axillary nodes normal. Resp: clear to auscultation bilaterally Back: symmetric, no curvature. ROM normal. No CVA tenderness. Cardio: regular rate and rhythm, S1, S2 normal, no murmur, click, rub or gallop GI: soft, non-tender; bowel sounds normal; no masses,  no organomegaly Extremities: extremities normal, atraumatic, no cyanosis or edema   ECOG PERFORMANCE STATUS: 1 - Symptomatic but completely ambulatory  Blood pressure 115/74, pulse 74, temperature 97.6 F (36.4 C), temperature source Tympanic, resp. rate 20, height '6\' 3"'  (1.905 m), weight 147 lb 12.8 oz (67 kg), SpO2 99 %.  LABORATORY DATA: Lab Results  Component Value Date   WBC 7.2 09/08/2020   HGB 14.8 09/08/2020   HCT 45.4 09/08/2020   MCV 90.6 09/08/2020   PLT 248 09/08/2020      Chemistry      Component Value Date/Time   NA 134 (L) 08/18/2020 0907   NA 132 (L) 10/05/2019 0830   NA 138 01/11/2017 1343   K 4.2 08/18/2020 0907   K 4.7 01/11/2017 1343   CL 105 08/18/2020 5361  CO2 22 08/18/2020 0907   CO2 23 01/11/2017 1343   BUN 17 08/18/2020 0907   BUN 14 10/05/2019 0830   BUN 20.2 01/11/2017 1343   CREATININE 1.83 (H) 08/18/2020 0907   CREATININE 1.6 (H) 01/11/2017 1343      Component Value Date/Time   CALCIUM 9.0 08/18/2020 0907   CALCIUM 9.3 01/11/2017 1343   ALKPHOS 95 08/18/2020 0907   ALKPHOS 89 01/11/2017 1343   AST 23 08/18/2020 0907   AST 41 (H) 01/11/2017 1343   ALT 6 08/18/2020 0907   ALT 27 01/11/2017 1343   BILITOT 0.5 08/18/2020 0907   BILITOT 0.49 01/11/2017 1343       RADIOGRAPHIC STUDIES: No results found.  ASSESSMENT AND PLAN:  This is a very pleasant 65 years old white male with a stage IB non-small cell lung cancer, adenocarcinoma status post wedge resection of the  left upper lobe followed by 4 cycles of adjuvant systemic chemotherapy with cisplatin and Alimta and he tolerated his treatment well except for fatigue. The patient has been on observation since June 2018.   The recent imaging studies including CT scan of the chest as well as a PET scan showed evidence for disease recurrence with metastatic disease presented with bilateral pulmonary nodules as well as destructive bone lesions at T4 vertebral body, biopsy proven to be metastatic adenocarcinoma. Molecular studies by foundation 1 showed no actionable mutations.  PDL 1 expression was negative. The patient is currently undergoing treatment with carboplatin, Alimta and Ketruda (pembrolizumab) status post 45 cycles.  Starting from cycle #5 he is on maintenance treatment with Alimta and Keytruda every 3 weeks. He has been on treatment with single agent Keytruda recently because of the renal insufficiency.  The patient has been tolerating this treatment well with no concerning complaints. I recommended for him to proceed with cycle #46 today as planned. I will see him back for follow-up visit in 3 weeks for evaluation before the next cycle of his treatment. The patient was advised to call immediately if he has any concerning symptoms in the interval. The patient voices understanding of current disease status and treatment options and is in agreement with the current care plan. All questions were answered. The patient knows to call the clinic with any problems, questions or concerns. We can certainly see the patient much sooner if necessary. Disclaimer: This note was dictated with voice recognition software. Similar sounding words can inadvertently be transcribed and may be missed upon review. Eilleen Kempf, MD 09/08/20

## 2020-09-08 NOTE — Telephone Encounter (Signed)
Requests to move appt to June 9th. Schedule message sent.

## 2020-09-08 NOTE — Patient Instructions (Signed)
[facility] MEDICAL [facility]COL[facility]  Discharge Instructions: ?Thank you for choosing [facility] to provide your oncology and hematology care.  ? ?If you have a lab appointment with the Cancer Center, please go directly to the Cancer Center and check in at the registration area. ?  ?Wear comfortable clothing and clothing appropriate for easy access to any Portacath or PICC line.  ? ?We strive to give you quality time with your provider. You may need to reschedule your appointment if you arrive late (15 or more minutes).  Arriving late affects you and other patients whose appointments are after yours.  Also, if you miss three or more appointments without notifying the office, you may be dismissed from the clinic at the provider?s discretion.    ?  ?For prescription refill requests, have your pharmacy contact our office and allow 72 hours for refills to be completed.   ? ?Today you received the following chemotherapy and/or immunotherapy agents: Keytruda ?  ?To help prevent nausea and vomiting after your treatment, we encourage you to take your nausea medication as directed. ? ?BELOW ARE SYMPTOMS THAT SHOULD BE REPORTED IMMEDIATELY: ?*FEVER GREATER THAN 100.4 F (38 ?C) OR HIGHER ?*CHILLS OR SWEATING ?*NAUSEA AND VOMITING THAT IS NOT CONTROLLED WITH YOUR NAUSEA MEDICATION ?*UNUSUAL SHORTNESS OF BREATH ?*UNUSUAL BRUISING OR BLEEDING ?*URINARY PROBLEMS (pain or burning when urinating, or frequent urination) ?*BOWEL PROBLEMS (unusual diarrhea, constipation, pain near the anus) ?TENDERNESS IN MOUTH AND THROAT WITH OR WITHOUT PRESENCE OF ULCERS (sore throat, sores in mouth, or a toothache) ?UNUSUAL RASH, SWELLING OR PAIN  ?UNUSUAL VAGINAL DISCHARGE OR ITCHING  ? ?Items with * indicate a potential emergency and should be followed up as soon as possible or go to the Emergency Department if any problems should occur. ? ?Please show the CHEMOTHERAPY ALERT CARD or IMMUNOTHERAPY ALERT CARD at check-in to the  Emergency Department and triage nurse. ? ?Should you have questions after your visit or need to cancel or reschedule your appointment, please contact [facility]  Dept: [phone]  and follow the prompts.  Office hours are 8:00 a.m. to 4:30 p.m. Monday - Friday. Please note that voicemails left after 4:00 p.m. may not be returned until the following business day.  We are closed weekends and major holidays. You have access to a nurse at all times for urgent questions. Please call the main number to the clinic Dept: [phone] and follow the prompts. ? ? ?For any non-urgent questions, you may also contact your provider using MyChart. We now offer e-Visits for anyone 18 and older to request care online for non-urgent symptoms. For details visit [email or web address]. ?  ?Also download the MyChart app! Go to the app store, search "MyChart", open the app, select [facility], and log in with your MyChart username and password. ? ?Due to Covid, a mask is required upon entering the hospital/clinic. If you do not have a mask, one will be given to you upon arrival. For doctor visits, patients may have 1 support person aged 18 or older with them. For treatment visits, patients cannot have anyone with them due to current Covid guidelines and our immunocompromised population.  ? ?

## 2020-09-09 ENCOUNTER — Telehealth: Payer: Self-pay | Admitting: Internal Medicine

## 2020-09-09 ENCOUNTER — Telehealth: Payer: Self-pay

## 2020-09-09 NOTE — Telephone Encounter (Signed)
Attempted to contact pt using both numbers provided to go over appts per 5/19 sch msg. No answer on either number and no vm set up. Will try to contact pt again later.

## 2020-09-09 NOTE — Telephone Encounter (Signed)
Pt LM requesting a return call at [phone]. I have attempted to call pt back. There was no answer and no VM setup. I was unable to LM.

## 2020-09-15 ENCOUNTER — Telehealth: Payer: Self-pay | Admitting: Internal Medicine

## 2020-09-15 NOTE — Telephone Encounter (Signed)
Pt called in to try and move appts to 6/9 per 5/19 sch msg. I informed pt right now we do not have the capacity to move the appt due to availability in the infusion room. Pt verbalized understanding and said he would keep appt on 6/8.

## 2020-09-20 ENCOUNTER — Encounter: Payer: Self-pay | Admitting: Internal Medicine

## 2020-09-20 NOTE — Progress Notes (Signed)
There was   Cardiology Office Note    Date:  10/04/2020   ID:  William Kales [name], DOB 1955/07/30, MRN [id number]   PCP:  [name], MD   [place]  Cardiologist:  Sherren Mocha, MD  Advanced Practice Provider:  No care team member to display Electrophysiologist:  None   [phone]   No chief complaint on file.   History of Present Illness:  [name]. is a 65 y.o. male with history of AAA status post stent graft in 2017, severe MR, metastatic adenocarcinoma of the lung.   Patient undergoing treatment with carboplatinum Alimta and Keytruda for lung CA.  Dr. Meda Coffee spoke with Dr. Earlie Server who said his lesions are shrinking and that we should go ahead and proceed with mitral valve repair as if he did not have cancer.  Repeat echo 05/2020 showed severe MR however normal filling pressures consistent with findings on cardiac cath 02/2020 and normal right-sided pressure.  Plans were for repeat echo in 6 months.  Patient saw Dr. Meda Coffee 05/2020 and symptoms had not changed so she continued to follow.  Patient comes in for f/u. Tolerating chemo pretty well. Had trouble with elevated kidney functions and Alimta stopped. Denies chest pain, dyspnea, dizziness, edema. Not very active. Walks his dog less than 1/4 mile daily.   Past Medical History:  Diagnosis Date   AAA (abdominal aortic aneurysm) ([place])    AAA (abdominal aortic aneurysm) without rupture ([place]) 02/04/2014   Cancer ([place])    lung   Encounter for antineoplastic chemotherapy 06/06/2016   History of hepatitis B    HNP (herniated nucleus pulposus), lumbar    L4 with radiculopathy   Hypertension    Lung cancer ([place]) 05/18/2016   Malignant neoplasm of lower lobe of left lung ([place]) 04/05/2016   Mass of left lung    Mitral insufficiency 04/06/2016   This patient will eventually need MV repair if his prognosis is good from oncology standpoint. However, his lung cancer therapy  is the priority right  now. His cardiac condition won't preclude possible lung surgery.  Once his lung cancer is under control he will need a TEE to further evaluate his mitral valve anatomy and MR severity, and also have ischemic workup as tere is evidence on calcificati   Renal insufficiency 10/09/2016   S/P partial lobectomy of lung 05/11/2016   Varicose veins of legs     Past Surgical History:  Procedure Laterality Date   ABDOMINAL AORTIC ENDOVASCULAR STENT GRAFT N/A 03/12/2016   Procedure: ABDOMINAL AORTIC ENDOVASCULAR STENT GRAFT;  Surgeon: Conrad [facility], MD;  Location: [place] [facility];  Service: Vascular;  Laterality: N/A;   COLONOSCOPY     INGUINAL HERNIA REPAIR Left 1975   Left inguinal hernia   INGUINAL HERNIA REPAIR Right    IR IMAGING GUIDED PORT INSERTION  09/04/2019   LOBECTOMY Left 05/11/2016   Procedure: LEFT LOWER LOBE LOBECTOMY AND LEFT UPPER LOBE  RESECTION AND PLACEMENT OF ON-Q;  Surgeon: [name], MD;  Location: [facility];  Service: Thoracic;  Laterality: Left;   LUMBAR LAMINECTOMY  October 22, 2012   LYMPH NODE DISSECTION Left 05/11/2016   Procedure: LYMPH NODE DISSECTION;  Surgeon: [name], MD;  Location: [facility];  Service: Thoracic;  Laterality: Left;   RIGHT/LEFT HEART CATH AND CORONARY ANGIOGRAPHY N/A 03/10/2020   Procedure: RIGHT/LEFT HEART CATH AND CORONARY ANGIOGRAPHY;  Surgeon: Sherren Mocha, MD;  Location: [facility];  Service: Cardiovascular;  Laterality: N/A;   STAPLING OF BLEBS Left 05/11/2016   Procedure: STAPLING OF APICAL BLEB;  Surgeon: [name], MD;  Location: [place] [facility];  Service: Thoracic;  Laterality: Left;   TEE WITHOUT CARDIOVERSION N/A 10/07/2019   Procedure: TRANSESOPHAGEAL ECHOCARDIOGRAM (TEE);  Surgeon: Dorothy Spark, MD;  Location: [facility];  Service: Cardiovascular;  Laterality: N/A;   VIDEO ASSISTED THORACOSCOPY Left 05/18/2016   Procedure: VIDEO ASSISTED THORACOSCOPY WITH REMOVAL OF LEFT APICAL BLEB;  Surgeon: [name], MD;  Location: [place];  Service: Thoracic;  Laterality: Left;   VIDEO ASSISTED THORACOSCOPY (VATS)/WEDGE RESECTION Left 05/11/2016   Procedure: LEFT VIDEO ASSISTED THORACOSCOPY (VATS);  Surgeon: [name], MD;  Location: [place];  Service: Thoracic;  Laterality: Left;   VIDEO BRONCHOSCOPY N/A 05/11/2016   Procedure: VIDEO BRONCHOSCOPY, LEFT LUNG;  Surgeon: [name], MD;  Location: [place];  Service: Thoracic;  Laterality: N/A;   VIDEO BRONCHOSCOPY N/A 05/18/2016   Procedure: VIDEO BRONCHOSCOPY WITH BRONCHIAL WASHING;  Surgeon: [name], MD;  Location: [facility];  Service: Thoracic;  Laterality: N/A;    Current Medications: Current Meds  Medication Sig   carvedilol (COREG) 6.25 MG tablet Take 1 tablet (6.25 mg total) by mouth 2 (two) times daily.   folic acid (FOLVITE) 1 MG tablet Take 1 tablet (1 mg total) by mouth daily.   lisinopril (ZESTRIL) 2.5 MG tablet TAKE 1 TABLET BY MOUTH EVERY DAY     Allergies:   Tramadol   Social History   Socioeconomic History   Marital status: Married    Spouse name: Not on file   Number of children: Not on file   Years of education: Not on file   Highest education level: Not on file  Occupational History   Not on file  Tobacco Use   Smoking status: Former    Packs/day: 1.00    Pack years: 0.00    Types: Cigarettes    Quit date: 03/05/2016    Years since quitting: 4.5   Smokeless tobacco: Never  Vaping Use   Vaping Use: Never used  Substance and Sexual Activity   Alcohol use: Yes    Alcohol/week: 6.0 standard drinks    Types: 6 Cans of beer per week   Drug use: Yes    Types: Cocaine, Heroin    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   ROS:   Please see the history of present illness.    ROS All other systems reviewed and are negative.   PHYSICAL EXAM:   VS:  BP 110/72   Pulse 73   Ht 6\' 3"  (1.905 m)   Wt 145 lb 12.8 oz (66.1 kg)   SpO2 94%   BMI 18.22 kg/m   Physical Exam  GEN: Thin, in no acute distress  Neck: no JVD, carotid bruits, or masses Cardiac:RRR; no murmurs, rubs, or gallops  Respiratory: Decreased breath sounds in the left lung otherwise clear  GI: soft, nontender, nondistended, + BS Ext: without  cyanosis, clubbing, or edema, Good distal pulses bilaterally Neuro:  Alert and Oriented x 3, Strength and sensation are intact Psych: euthymic mood, full affect  Wt Readings from Last 3 Encounters:  10/04/20 145 lb 12.8 oz (66.1 kg)  09/28/20 144 lb 11.2 oz (65.6 kg)  09/08/20 147 lb 12.8 oz (67 kg)      Studies/Labs Reviewed:   EKG:  EKG is not ordered today.  The ekg ordered today demonstrates   Recent Labs: 09/28/2020: ALT 8; BUN 19; Creatinine 1.76; Hemoglobin 16.2; Platelet Count 253; Potassium 4.4; Sodium 134; TSH 2.561   Lipid Panel No results found for: CHOL, TRIG, HDL, CHOLHDL, VLDL, LDLCALC, LDLDIRECT  Additional studies/ records that were reviewed today include:  TTE: 08/18/2019    1. Left ventricular ejection fraction, by estimation, is 60 to 65%. The  left ventricle has normal function. The left ventricle has no regional  wall motion abnormalities. There is mild left ventricular hypertrophy.  Left ventricular diastolic parameters  are consistent with Grade I diastolic dysfunction (impaired relaxation).   2. Right ventricular systolic function is normal. The right ventricular  size is normal. There is severely elevated pulmonary artery systolic  pressure. The estimated right ventricular systolic pressure is 25.0 mmHg.   3. Left atrial size was moderately dilated.   4.  The mitral valve is myxomatous. Severe mitral valve regurgitation.   5. The aortic valve is tricuspid. Aortic valve regurgitation is not  visualized.   6. Aortic dilatation noted. Aneurysm of the ascending aorta, measuring 40  mm. There is moderate to severe dilatation at the level of the sinuses of  Valsalva measuring 49 mm.    TEE 10/07/2019   1. There is 4+ primary mitral regurgitation with myxomatous anterior  mitral valve leaflet, with prolapse of all three scallops. The jet is  posteriorly directed, eccentric with reversal of pulmonary vein forward  flow bilateraly. PISA radius 1.0 cm, ERO  0.45 cm2, regurgitation volume 58 ml. Posterior leaflet measures 1.0 cm.  Flail gap 0.3 cm.   2. Left ventricular ejection fraction, by estimation, is 60 to 65%. The  left ventricle has normal function. The left ventricle has no regional  wall motion abnormalities.   3. Right ventricular systolic function is normal. The right ventricular  size is normal. There is normal pulmonary artery systolic pressure.   4. Left atrial size was moderately dilated. No left atrial/left atrial  appendage thrombus was detected. The LAA emptying velocity was 40 cm/s.   5. Right atrial size was mildly dilated.   6. The mitral valve is myxomatous. No evidence of mitral valve  regurgitation. No evidence of mitral stenosis.   7. Tricuspid valve regurgitation is moderate.   8. The aortic valve is normal in structure. Aortic valve regurgitation is  trivial. No aortic stenosis is present.   9. There is moderate dilatation at the level of the sinuses of Valsalva  measuring 48 mm. There is mild (Grade II) plaque.       Risk Assessment/Calculations:         ASSESSMENT:    1. Mitral valve prolapse   2. Severe mitral regurgitation   3. Malignant neoplasm of lower lobe of left lung ([place])   4. Ascending aortic aneurysm ([place])   5. Essential hypertension      PLAN:  In order of problems listed above:     Mitral valve prolapse severe 4+ mitral regurgitation, with worsening symptoms NYHA class IIb TEE in June 2021, repeat echocardiogram in  February 2022 showed severe mitral regurgitation however normal filling pressure consistent with findings on cardiac cath in November 2021 and normal right-sided pressures.  Patient symptoms have not changed. Plan to repeat his echocardiogram in  Sept & Dr. Johney Frame unless there is change in his symptoms.     Lung cancer -followed by Dr. Earlie Server who Dr. Meda Coffee communicated with him patient's plan and he believes he should be treated as if he did not have cancer.  He is responding well to chemotherapy and his lesion has been shrinking.  He is scheduled for another round of PET scanning in July.      Ascending aortic aneurysm -previously followed by Dr. Servando Snare, most recent CTA in January 2022 showed stable diameter 42 mm.   Essential hypertension -well-controlled on lisinopril and amlodipine      Shared Decision Making/Informed Consent        Medication Adjustments/Labs and Tests Ordered: Current medicines are reviewed at length with the patient today.  Concerns regarding medicines are outlined above.  Medication changes, Labs and Tests ordered today are listed in the Patient Instructions below. Patient Instructions  Medication Instructions:  Your physician recommends that you continue on your current medications as directed. Please refer to the Current Medication list given to you today.  *If you need a refill on your cardiac medications before your next appointment, please call your pharmacy*   Lab Work: None If you have labs (blood work) drawn today and your tests are completely normal, you will receive your results only by: [facility] (if you have MyChart) OR A paper copy in the mail If you have any lab test that is abnormal or we need to change your treatment, we will call you to review the results.   Testing/Procedures: Your physician  has requested that you have an echocardiogram. Echocardiography is a painless test that uses sound waves to create images of your heart. It provides your doctor with information about the size and shape of your heart and how well your heart's chambers and valves are working. This procedure takes approximately one hour. There are no restrictions for this procedure.   Follow-Up: At [facility], you and your health needs are our priority.  As part of our continuing mission to provide you with exceptional heart care, we have created designated Provider Care Teams.  These Care Teams include your primary Cardiologist (physician) and Advanced Practice Providers (APPs -  Physician Assistants and Nurse Practitioners) who all work together to provide you with the care you need, when you need it.   Your next appointment:   After Echo  The format for your next appointment:   In Person  Provider:   Gwyndolyn Kaufman, MD      Signed, [name], PA-C  10/04/2020 11:38 AM    [address], [place], [place]  [address] Phone: [phone]; Fax: [phone]

## 2020-09-23 NOTE — Progress Notes (Signed)
[facility] OFFICE PROGRESS NOTE  [name], [address] [place] [id number]  DIAGNOSIS: Metastatic non-small cell lung cancer, adenocarcinoma initially diagnosed as stage IB (T2a, N0, M0) non-small cell lung cancer, adenocarcinoma diagnosed in December 2017. The patient has evidence for disease recurrence in July 2019.  Biomarker Findings Tumor Mutational Burden - TMB-Intermediate (16 Muts/Mb) Microsatellite status - MS-Stable Genomic Findings For a complete list of the genes assayed, please refer to the Appendix. KIT amplification KRAS N27P SMAD4 splice site 8242-3N>T IR44 I232F 7 Disease relevant genes with no reportable alterations: EGFR, ALK, BRAF, MET, RET, ERBB2, ROS1   PDL 1 expression is 0%  PRIOR THERAPY: 1) status post left lower lobectomy as well as wedge resection of the left upper lobe on 05/11/2016. 2) Adjuvant systemic chemotherapy with cisplatin 75 MG/M2 and Alimta 500 MG/M2 every 3 weeks. First dose 07/03/2016. Status post 4 cycles  CURRENT THERAPY: Systemic chemotherapy with carboplatin for AUC of 5, Alimta 500 mg/M2 and Keytruda 200 mg IV every 3 weeks. Status post46cycles. Starting from cycle #5 the patient will be treated with maintenance Alimta and Keytruda (pembrolizumab) every 3 weeks.Alimta was discontinued due to renal insufficiency.   INTERVAL HISTORY: William Kales [name] 65 y.o. male returns to the clinic today forafollow-up visit.The patient is feeling fine today without any concerning complaints. He continues to tolerate his maintenance systemic chemotherapy with Alimta and Keytruda fairly well.Alimta is ultimately discontinued due to renal insuffiencey.He denied having any chest pain, cough,or hemoptysis. He denies shortness of breath.He denied having any fever or chills. He has no nausea, vomiting, diarrhea, or constipation. He denied having any headache or visual changes. The patient is here today for evaluation  before starting cycle #47.  MEDICAL HISTORY: Past Medical History:  Diagnosis Date  . AAA (abdominal aortic aneurysm) ([place])   . AAA (abdominal aortic aneurysm) without rupture ([place]) 02/04/2014  . Cancer ([place])    lung  . Encounter for antineoplastic chemotherapy 06/06/2016  . History of hepatitis B   . HNP (herniated nucleus pulposus), lumbar    L4 with radiculopathy  . Hypertension   . Lung cancer ([place]) 05/18/2016  . Malignant neoplasm of lower lobe of left lung ([place]) 04/05/2016  . Mass of left lung   . Mitral insufficiency 04/06/2016   This patient will eventually need MV repair if his prognosis is good from oncology standpoint. However, his lung cancer therapy  is the priority right now. His cardiac condition won't preclude possible lung surgery.  Once his lung cancer is under control he will need a TEE to further evaluate his mitral valve anatomy and MR severity, and also have ischemic workup as tere is evidence on calcificati  . Renal insufficiency 10/09/2016  . S/P partial lobectomy of lung 05/11/2016  . Varicose veins of legs     ALLERGIES:  is allergic to tramadol.  MEDICATIONS:  Current Outpatient Medications  Medication Sig Dispense Refill  . carvedilol (COREG) 6.25 MG tablet Take 1 tablet (6.25 mg total) by mouth 2 (two) times daily. 315 tablet 3  . folic acid (FOLVITE) 1 MG tablet Take 1 tablet (1 mg total) by mouth daily. 90 tablet 1  . lisinopril (ZESTRIL) 2.5 MG tablet TAKE 1 TABLET BY MOUTH EVERY DAY 90 tablet 3   No current facility-administered medications for this visit.    SURGICAL HISTORY:  Past Surgical History:  Procedure Laterality Date  . ABDOMINAL AORTIC ENDOVASCULAR STENT GRAFT N/A 03/12/2016   Procedure:  ABDOMINAL AORTIC ENDOVASCULAR STENT GRAFT;  Surgeon: Conrad [place]ville, MD;  Location: [facility];  Service: Vascular;  Laterality: N/A;  . COLONOSCOPY    . INGUINAL HERNIA REPAIR Left 1975   Left inguinal hernia  . INGUINAL HERNIA REPAIR Right   . IR IMAGING  GUIDED PORT INSERTION  09/04/2019  . LOBECTOMY Left 05/11/2016   Procedure: LEFT LOWER LOBE LOBECTOMY AND LEFT UPPER LOBE  RESECTION AND PLACEMENT OF ON-Q;  Surgeon: [name], MD;  Location: [facility];  Service: Thoracic;  Laterality: Left;  . LUMBAR LAMINECTOMY  October 22, 2012  . LYMPH NODE DISSECTION Left 05/11/2016   Procedure: LYMPH NODE DISSECTION;  Surgeon: [name], MD;  Location: [facility];  Service: Thoracic;  Laterality: Left;  . RIGHT/LEFT HEART CATH AND CORONARY ANGIOGRAPHY N/A 03/10/2020   Procedure: RIGHT/LEFT HEART CATH AND CORONARY ANGIOGRAPHY;  Surgeon: Sherren Mocha, MD;  Location: [facility];  Service: Cardiovascular;  Laterality: N/A;  . STAPLING OF BLEBS Left 05/11/2016   Procedure: STAPLING OF APICAL BLEB;  Surgeon: [name], MD;  Location: [facility];  Service: Thoracic;  Laterality: Left;  . TEE WITHOUT CARDIOVERSION N/A 10/07/2019   Procedure: TRANSESOPHAGEAL ECHOCARDIOGRAM (TEE);  Surgeon: Dorothy Spark, MD;  Location: [facility];  Service: Cardiovascular;  Laterality: N/A;  . VIDEO ASSISTED THORACOSCOPY Left 05/18/2016   Procedure: VIDEO ASSISTED THORACOSCOPY WITH REMOVAL OF LEFT APICAL BLEB;  Surgeon: [name], MD;  Location: [facility];  Service: Thoracic;  Laterality: Left;  Marland Kitchen VIDEO ASSISTED THORACOSCOPY (VATS)/WEDGE RESECTION Left 05/11/2016   Procedure: LEFT VIDEO ASSISTED THORACOSCOPY (VATS);  Surgeon: [name], MD;  Location: [facility];  Service: Thoracic;  Laterality: Left;  Marland Kitchen VIDEO BRONCHOSCOPY N/A 05/11/2016   Procedure: VIDEO BRONCHOSCOPY, LEFT LUNG;  Surgeon: [name], MD;  Location: [facility];  Service: Thoracic;  Laterality: N/A;  . VIDEO BRONCHOSCOPY N/A 05/18/2016   Procedure: VIDEO BRONCHOSCOPY WITH BRONCHIAL WASHING;  Surgeon: [name], MD;  Location: [facility];  Service: Thoracic;  Laterality: N/A;    REVIEW OF SYSTEMS:   Review of Systems  Constitutional: Negative for appetite change, chills, fatigue, fever  and unexpected weight change.  HENT:   Negative for mouth sores, nosebleeds, sore throat and trouble swallowing.   Eyes: Negative for eye problems and icterus.  Respiratory: Negative for cough, hemoptysis, shortness of breath and wheezing.   Cardiovascular: Negative for chest pain and leg swelling.  Gastrointestinal: Negative for abdominal pain, constipation, diarrhea, nausea and vomiting.  Genitourinary: Negative for bladder incontinence, difficulty urinating, dysuria, frequency and hematuria.   Musculoskeletal: Negative for back pain, gait problem, neck pain and neck stiffness.  Skin: Negative for itching and rash.  Neurological: Negative for dizziness, extremity weakness, gait problem, headaches, light-headedness and seizures.  Hematological: Negative for adenopathy. Does not bruise/bleed easily.  Psychiatric/Behavioral: Negative for confusion, depression and sleep disturbance. The patient is not nervous/anxious.     PHYSICAL EXAMINATION:  Blood pressure 103/79, pulse 84, temperature (!) 97.5 F (36.4 C), temperature source Tympanic, resp. rate 20, height 6' 3" (1.905 m), weight 144 lb 11.2 oz (65.6 kg), SpO2 99 %.  ECOG PERFORMANCE STATUS: 1 - Symptomatic but completely ambulatory  Physical Exam  Constitutional: Oriented to person, place, and time and well-developed, well-nourished, and in no distress.  HENT:  Head: Normocephalic and atraumatic.  Mouth/Throat: Oropharynx is clear and moist. No oropharyngeal exudate.  Eyes: Conjunctivae are normal. Right eye exhibits no discharge. Left eye exhibits no discharge.  No scleral icterus.  Neck: Normal range of motion. Neck supple.  Cardiovascular:Systolic murmur auscultatedin hisposterior left thorax. Normal rate, regular rhythm and intact distal pulses.  Pulmonary/Chest: Effort normal and breath sounds normal. No respiratory distress. No wheezes. No rales.  Abdominal: Soft. Bowel sounds are normal. Exhibits no distension and no mass.  There is no tenderness.  Musculoskeletal: Normal range of motion. Exhibits no edema.  Lymphadenopathy:  No cervical adenopathy.  Neurological: Alert and oriented to person, place, and time. Exhibits normal muscle tone. Gait normal. Coordination normal.  Skin: Skin is warm and dry. No rash noted. Not diaphoretic. No erythema. No pallor.  Psychiatric: Mood, memory and judgment normal.  Vitals reviewed  LABORATORY DATA: Lab Results  Component Value Date   WBC 9.2 09/28/2020   HGB 16.2 09/28/2020   HCT 49.5 09/28/2020   MCV 89.0 09/28/2020   PLT 253 09/28/2020      Chemistry      Component Value Date/Time   NA 134 (L) 09/08/2020 0820   NA 132 (L) 10/05/2019 0830   NA 138 01/11/2017 1343   K 4.3 09/08/2020 0820   K 4.7 01/11/2017 1343   CL 104 09/08/2020 0820   CO2 22 09/08/2020 0820   CO2 23 01/11/2017 1343   BUN 17 09/08/2020 0820   BUN 14 10/05/2019 0830   BUN 20.2 01/11/2017 1343   CREATININE 1.78 (H) 09/08/2020 0820   CREATININE 1.6 (H) 01/11/2017 1343      Component Value Date/Time   CALCIUM 9.1 09/08/2020 0820   CALCIUM 9.3 01/11/2017 1343   ALKPHOS 81 09/08/2020 0820   ALKPHOS 89 01/11/2017 1343   AST 20 09/08/2020 0820   AST 41 (H) 01/11/2017 1343   ALT <6 09/08/2020 0820   ALT 27 01/11/2017 1343   BILITOT 0.5 09/08/2020 0820   BILITOT 0.49 01/11/2017 1343       RADIOGRAPHIC STUDIES:  No results found.   ASSESSMENT/PLAN:  This is a very pleasant 65year old [place] American male with metastatic disease who was initially diagnosed with stage IB non-small cell lung cancer, adenocarcinoma of the left upper lobe. Molecular studies showed KRAS G12Cmutation which can be targetted in the second line setting in the future.PD-L1 expression was negative. First diagnosed December 2017.  He was status post a wedge resection of the left upper lobe followed by 4 cycles of adjuvant systemic chemotherapy with cisplatin and Alimta. He tolerated treatment well except  for fatigue.  He had been on observationuntilJune of 2018.  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  Theis currently undergoingsystemic chemotherapy with carboplatin, alimta, and keytruda. He is status post46cycles. Starting from cycle #5, he has been on maintenance Alimta and Keytruda every 3 weeks. His Alimta has been held during a few cycles of treatment due to renal insufficiency. It was ultimately discontinued due to renal insufficiency.     Labs reviewed.  Recommend that he proceed with cycle #47 today scheduled.  I will arrange for restaging CT scan of the chest, abdomen, and pelvis prior to starting his next cycle of treatment. I will order this without contrast due to his renal insufficiency.  We will see the patient back for follow-up visit in 3 weeks for evaluation before starting cycle #48 and to review his scan results.  The patient was advised to call immediately if he has any concerning symptoms in  the interval. The patient voices understanding of current disease status and treatment options and is in agreement with the current care plan. All questions were answered. The patient knows to call the clinic with any problems, questions or concerns. We can certainly see the patient much sooner if necessary          Orders Placed This Encounter  Procedures  . CT Chest Wo Contrast    Standing Status:   Future    Standing Expiration Date:   09/28/2021    Order Specific Question:   Preferred imaging location?    Answer:   [facility]  . CT Abdomen Pelvis Wo Contrast    Standing Status:   Future    Standing Expiration Date:   09/28/2021    Order Specific Question:   Preferred imaging location?    Answer:   [facility]    Order Specific Question:   Is Oral Contrast requested for this exam?    Answer:   Yes, Per  Radiology protocol     I spent 20-29 minutes in this encounter.   Cassandra L Heilingoetter, PA-C 09/28/20

## 2020-09-28 ENCOUNTER — Inpatient Hospital Stay: Payer: Federal, State, Local not specified - PPO

## 2020-09-28 ENCOUNTER — Other Ambulatory Visit: Payer: Medicare Other

## 2020-09-28 ENCOUNTER — Inpatient Hospital Stay: Payer: Federal, State, Local not specified - PPO | Attending: Internal Medicine | Admitting: Physician Assistant

## 2020-09-28 ENCOUNTER — Encounter: Payer: Self-pay | Admitting: Physician Assistant

## 2020-09-28 ENCOUNTER — Other Ambulatory Visit: Payer: Self-pay

## 2020-09-28 VITALS — BP 103/79 | HR 84 | Temp 97.5°F | Resp 20 | Ht 75.0 in | Wt 144.7 lb

## 2020-09-28 DIAGNOSIS — Z902 Acquired absence of lung [part of]: Secondary | ICD-10-CM

## 2020-09-28 DIAGNOSIS — Z95828 Presence of other vascular implants and grafts: Secondary | ICD-10-CM

## 2020-09-28 DIAGNOSIS — Z5112 Encounter for antineoplastic immunotherapy: Secondary | ICD-10-CM | POA: Diagnosis not present

## 2020-09-28 DIAGNOSIS — C3492 Malignant neoplasm of unspecified part of left bronchus or lung: Secondary | ICD-10-CM

## 2020-09-28 DIAGNOSIS — C3432 Malignant neoplasm of lower lobe, left bronchus or lung: Secondary | ICD-10-CM

## 2020-09-28 DIAGNOSIS — Z79899 Other long term (current) drug therapy: Secondary | ICD-10-CM | POA: Insufficient documentation

## 2020-09-28 DIAGNOSIS — C3412 Malignant neoplasm of upper lobe, left bronchus or lung: Secondary | ICD-10-CM | POA: Diagnosis not present

## 2020-09-28 LAB — CMP (CANCER CENTER ONLY)
ALT: 8 U/L (ref 0–44)
AST: 16 U/L (ref 15–41)
Albumin: 3.1 g/dL — ABNORMAL LOW (ref 3.5–5.0)
Alkaline Phosphatase: 88 U/L (ref 38–126)
Anion gap: 10 (ref 5–15)
BUN: 19 mg/dL (ref 8–23)
CO2: 20 mmol/L — ABNORMAL LOW (ref 22–32)
Calcium: 9.3 mg/dL (ref 8.9–10.3)
Chloride: 104 mmol/L (ref 98–111)
Creatinine: 1.76 mg/dL — ABNORMAL HIGH (ref 0.61–1.24)
GFR, Estimated: 42 mL/min — ABNORMAL LOW (ref >60)
Glucose, Bld: 97 mg/dL (ref 70–99)
Potassium: 4.4 mmol/L (ref 3.5–5.1)
Sodium: 134 mmol/L — ABNORMAL LOW (ref 135–145)
Total Bilirubin: 0.5 mg/dL (ref 0.3–1.2)
Total Protein: 7.2 g/dL (ref 6.5–8.1)

## 2020-09-28 LAB — CBC WITH DIFFERENTIAL (CANCER CENTER ONLY)
Abs Immature Granulocytes: 0.04 10*3/uL (ref 0.00–0.07)
Basophils Absolute: 0.1 10*3/uL (ref 0.0–0.1)
Basophils Relative: 1 %
Eosinophils Absolute: 0.4 10*3/uL (ref 0.0–0.5)
Eosinophils Relative: 5 %
HCT: 49.5 % (ref 39.0–52.0)
Hemoglobin: 16.2 g/dL (ref 13.0–17.0)
Immature Granulocytes: 0 %
Lymphocytes Relative: 9 %
Lymphs Abs: 0.8 10*3/uL (ref 0.7–4.0)
MCH: 29.1 pg (ref 26.0–34.0)
MCHC: 32.7 g/dL (ref 30.0–36.0)
MCV: 89 fL (ref 80.0–100.0)
Monocytes Absolute: 1.2 10*3/uL — ABNORMAL HIGH (ref 0.1–1.0)
Monocytes Relative: 13 %
Neutro Abs: 6.7 10*3/uL (ref 1.7–7.7)
Neutrophils Relative %: 72 %
Platelet Count: 253 10*3/uL (ref 150–400)
RBC: 5.56 MIL/uL (ref 4.22–5.81)
RDW: 13.3 % (ref 11.5–15.5)
WBC Count: 9.2 10*3/uL (ref 4.0–10.5)
nRBC: 0 % (ref 0.0–0.2)

## 2020-09-28 LAB — TSH: TSH: 2.561 u[IU]/mL (ref 0.320–4.118)

## 2020-09-28 MED ORDER — SODIUM CHLORIDE 0.9% FLUSH
10.0000 mL | INTRAVENOUS | Status: DC | PRN
  Administered 2020-09-28: 10 mL
  Filled 2020-09-28: qty 10

## 2020-09-28 MED ORDER — SODIUM CHLORIDE 0.9 % IV SOLN
200.0000 mg | Freq: Once | INTRAVENOUS | Status: AC
  Administered 2020-09-28: 200 mg via INTRAVENOUS
  Filled 2020-09-28: qty 8

## 2020-09-28 MED ORDER — SODIUM CHLORIDE 0.9% FLUSH
10.0000 mL | Freq: Once | INTRAVENOUS | Status: AC
  Administered 2020-09-28: 10 mL
  Filled 2020-09-28: qty 10

## 2020-09-28 MED ORDER — SODIUM CHLORIDE 0.9 % IV SOLN
Freq: Once | INTRAVENOUS | Status: AC
  Filled 2020-09-28: qty 250

## 2020-09-28 MED ORDER — HEPARIN SOD (PORK) LOCK FLUSH 100 UNIT/ML IV SOLN
500.0000 [IU] | Freq: Once | INTRAVENOUS | Status: AC | PRN
  Administered 2020-09-28: 500 [IU]
  Filled 2020-09-28: qty 5

## 2020-09-28 NOTE — Patient Instructions (Signed)
[facility]  Discharge Instructions: ?Thank you for choosing [facility] to provide your oncology and hematology care.  ? ?If you have a lab appointment with the Cancer Center, please go directly to the Cancer Center and check in at the registration area. ?  ?Wear comfortable clothing and clothing appropriate for easy access to any Portacath or PICC line.  ? ?We strive to give you quality time with your provider. You may need to reschedule your appointment if you arrive late (15 or more minutes).  Arriving late affects you and other patients whose appointments are after yours.  Also, if you miss three or more appointments without notifying the office, you may be dismissed from the clinic at the provider?s discretion.    ?  ?For prescription refill requests, have your pharmacy contact our office and allow 72 hours for refills to be completed.   ? ?Today you received the following chemotherapy and/or immunotherapy agents: Keytruda ?  ?To help prevent nausea and vomiting after your treatment, we encourage you to take your nausea medication as directed. ? ?BELOW ARE SYMPTOMS THAT SHOULD BE REPORTED IMMEDIATELY: ?*FEVER GREATER THAN 100.4 F (38 ?C) OR HIGHER ?*CHILLS OR SWEATING ?*NAUSEA AND VOMITING THAT IS NOT CONTROLLED WITH YOUR NAUSEA MEDICATION ?*UNUSUAL SHORTNESS OF BREATH ?*UNUSUAL BRUISING OR BLEEDING ?*URINARY PROBLEMS (pain or burning when urinating, or frequent urination) ?*BOWEL PROBLEMS (unusual diarrhea, constipation, pain near the anus) ?TENDERNESS IN MOUTH AND THROAT WITH OR WITHOUT PRESENCE OF ULCERS (sore throat, sores in mouth, or a toothache) ?UNUSUAL RASH, SWELLING OR PAIN  ?UNUSUAL VAGINAL DISCHARGE OR ITCHING  ? ?Items with * indicate a potential emergency and should be followed up as soon as possible or go to the Emergency Department if any problems should occur. ? ?Please show the CHEMOTHERAPY ALERT CARD or IMMUNOTHERAPY ALERT CARD at check-in to the  Emergency Department and triage nurse. ? ?Should you have questions after your visit or need to cancel or reschedule your appointment, please contact [facility]  Dept: [phone]  and follow the prompts.  Office hours are 8:00 a.m. to 4:30 p.m. Monday - Friday. Please note that voicemails left after 4:00 p.m. may not be returned until the following business day.  We are closed weekends and major holidays. You have access to a nurse at all times for urgent questions. Please call the main number to the clinic Dept: [phone] and follow the prompts. ? ? ?For any non-urgent questions, you may also contact your provider using MyChart. We now offer e-Visits for anyone 18 and older to request care online for non-urgent symptoms. For details visit [email or web address]. ?  ?Also download the MyChart app! Go to the app store, search "MyChart", open the app, select [facility], and log in with your MyChart username and password. ? ?Due to Covid, a mask is required upon entering the hospital/clinic. If you do not have a mask, one will be given to you upon arrival. For doctor visits, patients may have 1 support person aged 18 or older with them. For treatment visits, patients cannot have anyone with them due to current Covid guidelines and our immunocompromised population.  ? ?

## 2020-09-28 NOTE — Progress Notes (Signed)
Okay to proceed with Keytruda with Creatinine today per Cassie, PA-C.

## 2020-09-29 ENCOUNTER — Ambulatory Visit: Payer: Medicare Other | Admitting: Cardiology

## 2020-09-30 ENCOUNTER — Telehealth: Payer: Self-pay | Admitting: Physician Assistant

## 2020-09-30 NOTE — Telephone Encounter (Signed)
Scheduled per los. Called and spoke with patient. Confirmed appt 

## 2020-10-04 ENCOUNTER — Ambulatory Visit (INDEPENDENT_AMBULATORY_CARE_PROVIDER_SITE_OTHER): Payer: Federal, State, Local not specified - PPO | Admitting: Physician Assistant

## 2020-10-04 ENCOUNTER — Encounter: Payer: Self-pay | Admitting: Physician Assistant

## 2020-10-04 ENCOUNTER — Other Ambulatory Visit: Payer: Self-pay

## 2020-10-04 VITALS — BP 110/72 | HR 73 | Ht 75.0 in | Wt 145.8 lb

## 2020-10-04 DIAGNOSIS — I712 Thoracic aortic aneurysm, without rupture: Secondary | ICD-10-CM

## 2020-10-04 DIAGNOSIS — I341 Nonrheumatic mitral (valve) prolapse: Secondary | ICD-10-CM | POA: Diagnosis not present

## 2020-10-04 DIAGNOSIS — C3432 Malignant neoplasm of lower lobe, left bronchus or lung: Secondary | ICD-10-CM

## 2020-10-04 DIAGNOSIS — I340 Nonrheumatic mitral (valve) insufficiency: Secondary | ICD-10-CM | POA: Diagnosis not present

## 2020-10-04 DIAGNOSIS — I10 Essential (primary) hypertension: Secondary | ICD-10-CM

## 2020-10-04 DIAGNOSIS — I7121 Aneurysm of the ascending aorta, without rupture: Secondary | ICD-10-CM

## 2020-10-04 NOTE — Patient Instructions (Signed)
Medication Instructions:  Your physician recommends that you continue on your current medications as directed. Please refer to the Current Medication list given to you today.  *If you need a refill on your cardiac medications before your next appointment, please call your pharmacy*   Lab Work: None If you have labs (blood work) drawn today and your tests are completely normal, you will receive your results only by: Plain (if you have MyChart) OR A paper copy in the mail If you have any lab test that is abnormal or we need to change your treatment, we will call you to review the results.   Testing/Procedures: Your physician has requested that you have an echocardiogram. Echocardiography is a painless test that uses sound waves to create images of your heart. It provides your doctor with information about the size and shape of your heart and how well your heart's chambers and valves are working. This procedure takes approximately one hour. There are no restrictions for this procedure.   Follow-Up: At [facility], you and your health needs are our priority.  As part of our continuing mission to provide you with exceptional heart care, we have created designated Provider Care Teams.  These Care Teams include your primary Cardiologist (physician) and Advanced Practice Providers (APPs -  Physician Assistants and Nurse Practitioners) who all work together to provide you with the care you need, when you need it.   Your next appointment:   After Echo  The format for your next appointment:   In Person  Provider:   Gwyndolyn Kaufman, MD

## 2020-10-17 ENCOUNTER — Other Ambulatory Visit: Payer: Self-pay

## 2020-10-17 ENCOUNTER — Ambulatory Visit (HOSPITAL_COMMUNITY)
Admission: RE | Admit: 2020-10-17 | Discharge: 2020-10-17 | Disposition: A | Discharge status: Home / Self Care | Payer: Federal, State, Local not specified - PPO | Source: Ambulatory Visit | Attending: Physician Assistant | Admitting: Physician Assistant

## 2020-10-17 ENCOUNTER — Ambulatory Visit (HOSPITAL_COMMUNITY): Admission: RE | Admit: 2020-10-17 | Payer: Federal, State, Local not specified - PPO | Source: Ambulatory Visit

## 2020-10-17 DIAGNOSIS — R911 Solitary pulmonary nodule: Secondary | ICD-10-CM | POA: Diagnosis not present

## 2020-10-17 DIAGNOSIS — C3492 Malignant neoplasm of unspecified part of left bronchus or lung: Secondary | ICD-10-CM | POA: Diagnosis not present

## 2020-10-17 DIAGNOSIS — R918 Other nonspecific abnormal finding of lung field: Secondary | ICD-10-CM | POA: Diagnosis not present

## 2020-10-17 DIAGNOSIS — J90 Pleural effusion, not elsewhere classified: Secondary | ICD-10-CM | POA: Diagnosis not present

## 2020-10-17 DIAGNOSIS — C3490 Malignant neoplasm of unspecified part of unspecified bronchus or lung: Secondary | ICD-10-CM | POA: Diagnosis not present

## 2020-10-17 DIAGNOSIS — I700 Atherosclerosis of aorta: Secondary | ICD-10-CM | POA: Diagnosis not present

## 2020-10-17 DIAGNOSIS — J439 Emphysema, unspecified: Secondary | ICD-10-CM | POA: Diagnosis not present

## 2020-10-20 ENCOUNTER — Ambulatory Visit: Payer: Federal, State, Local not specified - PPO | Admitting: Physician Assistant

## 2020-10-20 ENCOUNTER — Other Ambulatory Visit: Payer: Federal, State, Local not specified - PPO

## 2020-10-20 ENCOUNTER — Ambulatory Visit: Payer: Federal, State, Local not specified - PPO

## 2020-10-21 ENCOUNTER — Telehealth: Payer: Self-pay | Admitting: Internal Medicine

## 2020-10-21 NOTE — Telephone Encounter (Signed)
Scheduled appts per 6/30 sch msg. Pt aware.

## 2020-10-21 NOTE — Progress Notes (Signed)
[facility] OFFICE PROGRESS NOTE  [name], [place] [id number]  DIAGNOSIS: Metastatic non-small cell lung cancer, adenocarcinoma initially diagnosed as stage IB (T2a, N0, M0) non-small cell lung cancer, adenocarcinoma diagnosed in December 2017.  The patient has evidence for disease recurrence in July 2019.   Biomarker Findings Tumor Mutational Burden - TMB-Intermediate (16 Muts/Mb) Microsatellite status - MS-Stable Genomic Findings For a complete list of the genes assayed, please refer to the Appendix. KIT amplification KRAS P82U SMAD4 splice site [id number] ER15 I232F 7 Disease relevant genes with no reportable alterations: EGFR, ALK, BRAF, MET, RET, ERBB2, ROS1   PDL 1 expression is 0%  PRIOR THERAPY: 1) status post left lower lobectomy as well as wedge resection of the left upper lobe on 05/11/2016. 2) Adjuvant systemic chemotherapy with cisplatin 75 MG/M2 and Alimta 500 MG/M2 every 3 weeks. First dose 07/03/2016. Status post 4 cycles 3) Systemic chemotherapy with carboplatin for AUC of 5, Alimta 500 mg/M2 and Keytruda 200 mg IV every 3 weeks.  Status post 47 cycles.  Starting from cycle #5 the patient will be treated with maintenance Alimta and Keytruda (pembrolizumab) every 3 weeks. Alimta was discontinued due to renal insufficiency. Last dose 09/28/20. Discontinued due to disease progression.   CURRENT THERAPY: Lumakras 960 mg p.o. daily. First dose expected in the next few days.   INTERVAL HISTORY: William Kales [name] 65 y.o. male returns to the clinic today for a follow-up visit. The patient is feeling fine today without any concerning complaints except he had significant diarrhea following his restaging CT scan.  Of note, the patient scan did not show any abnormalities in the stomach or bowel on imaging studies. He states the diarrhea occurred soon after he arrived at the clinic for his scan on Tuesday 6/28. He reports his diarrhea lasted  until that Friday 7/1. He denies having any blood in his stool during this period. He reports fatigue and changes in appetite due to the diarrhea. Since Friday, he states his appetite is back at baseline and his fatigue has mostly resolved. He denies any nausea or vomiting. He denies any recent infections such as pneumonia or COVID-19.  He continues to tolerate his maintenance systemic chemotherapy with Alimta and Keytruda fairly well. Alimta is ultimately discontinued due to renal insuffiencey. He denied having any chest pain, cough, or hemoptysis. He denies shortness of breath. He denied having any fever or chills. He denied having any headache or visual changes. His only reported skin change is some redness around his upper forehead which has been mentioned at a previous visit. There has been no change to the area. The patient recently had a restaging CT scan performed. The patient is here today for evaluation and to review his scan results before starting cycle #48.   MEDICAL HISTORY: Past Medical History:  Diagnosis Date   AAA (abdominal aortic aneurysm) ([place] [facility])    AAA (abdominal aortic aneurysm) without rupture ([place]) 02/04/2014   Cancer ([place])    lung   Encounter for antineoplastic chemotherapy 06/06/2016   History of hepatitis B    HNP (herniated nucleus pulposus), lumbar    L4 with radiculopathy   Hypertension    Lung cancer ([place]) 05/18/2016   Malignant neoplasm of lower lobe of left lung ([place]) 04/05/2016   Mass of left lung    Mitral insufficiency 04/06/2016   This patient will eventually need MV repair if his prognosis is good from oncology standpoint. However, his  lung cancer therapy  is the priority right now. His cardiac condition won't preclude possible lung surgery.  Once his lung cancer is under control he will need a TEE to further evaluate his mitral valve anatomy and MR severity, and also have ischemic workup as tere is evidence on calcificati   Renal insufficiency 10/09/2016   S/P  partial lobectomy of lung 05/11/2016   Varicose veins of legs     ALLERGIES:  is allergic to tramadol.  MEDICATIONS:  Current Outpatient Medications  Medication Sig Dispense Refill   carvedilol (COREG) 6.25 MG tablet Take 1 tablet (6.25 mg total) by mouth 2 (two) times daily. 620 tablet 3   folic acid (FOLVITE) 1 MG tablet Take 1 tablet (1 mg total) by mouth daily. 90 tablet 1   lisinopril (ZESTRIL) 2.5 MG tablet TAKE 1 TABLET BY MOUTH EVERY DAY 90 tablet 3   No current facility-administered medications for this visit.   Facility-Administered Medications Ordered in Other Visits  Medication Dose Route Frequency Provider Last Rate Last Admin   heparin lock flush 100 unit/mL  500 Units Intracatheter Once Curt Bears, MD       sodium chloride flush (NS) 0.9 % injection 10 mL  10 mL Intracatheter Once Curt Bears, MD        SURGICAL HISTORY:  Past Surgical History:  Procedure Laterality Date   ABDOMINAL AORTIC ENDOVASCULAR STENT GRAFT N/A 03/12/2016   Procedure: ABDOMINAL AORTIC ENDOVASCULAR STENT GRAFT;  Surgeon: Conrad Cyrus, MD;  Location: [facility];  Service: Vascular;  Laterality: N/A;   COLONOSCOPY     INGUINAL HERNIA REPAIR Left 1975   Left inguinal hernia   INGUINAL HERNIA REPAIR Right    IR IMAGING GUIDED PORT INSERTION  09/04/2019   LOBECTOMY Left 05/11/2016   Procedure: LEFT LOWER LOBE LOBECTOMY AND LEFT UPPER LOBE  RESECTION AND PLACEMENT OF ON-Q;  Surgeon: [name], MD;  Location: [facility];  Service: Thoracic;  Laterality: Left;   LUMBAR LAMINECTOMY  October 22, 2012   LYMPH NODE DISSECTION Left 05/11/2016   Procedure: LYMPH NODE DISSECTION;  Surgeon: [name], MD;  Location: [facility];  Service: Thoracic;  Laterality: Left;   RIGHT/LEFT HEART CATH AND CORONARY ANGIOGRAPHY N/A 03/10/2020   Procedure: RIGHT/LEFT HEART CATH AND CORONARY ANGIOGRAPHY;  Surgeon: Sherren Mocha, MD;  Location: [facility];  Service: Cardiovascular;  Laterality: N/A;   STAPLING  OF BLEBS Left 05/11/2016   Procedure: STAPLING OF APICAL BLEB;  Surgeon: [name], MD;  Location: [facility];  Service: Thoracic;  Laterality: Left;   TEE WITHOUT CARDIOVERSION N/A 10/07/2019   Procedure: TRANSESOPHAGEAL ECHOCARDIOGRAM (TEE);  Surgeon: Dorothy Spark, MD;  Location: [facility] END[facility]COPY;  Service: Cardiovascular;  Laterality: N/A;   VIDEO ASSISTED THORACOSCOPY Left 05/18/2016   Procedure: VIDEO ASSISTED THORACOSCOPY WITH REMOVAL OF LEFT APICAL BLEB;  Surgeon: [name], MD;  Location: [facility];  Service: Thoracic;  Laterality: Left;   VIDEO ASSISTED THORACOSCOPY (VATS)/WEDGE RESECTION Left 05/11/2016   Procedure: LEFT VIDEO ASSISTED THORACOSCOPY (VATS);  Surgeon: [name], MD;  Location: [facility];  Service: Thoracic;  Laterality: Left;   VIDEO BRONCHOSCOPY N/A 05/11/2016   Procedure: VIDEO BRONCHOSCOPY, LEFT LUNG;  Surgeon: [name], MD;  Location: [facility];  Service: Thoracic;  Laterality: N/A;   VIDEO BRONCHOSCOPY N/A 05/18/2016   Procedure: VIDEO BRONCHOSCOPY WITH BRONCHIAL WASHING;  Surgeon: [name], MD;  Location: [facility];  Service: Thoracic;  Laterality: N/A;  REVIEW OF SYSTEMS:   Review of Systems Constitutional: Positive fatigue and appetite changes. Negative for chills, fever and unexpected weight change.  HENT: Negative for mouth sores, nosebleeds, sore throat and trouble swallowing.   Eyes: Negative for eye problems and icterus.  Respiratory: Negative for cough, hemoptysis, shortness of breath and wheezing.   Cardiovascular: Negative for chest pain and leg swelling.  Gastrointestinal: Positive diarrhea.  Negative for abdominal pain, constipation, nausea and vomiting.  Genitourinary: Negative for bladder incontinence, difficulty urinating, dysuria, frequency and hematuria.   Musculoskeletal: Negative for back pain, gait problem, neck pain and neck stiffness.  Skin: Positive skin change on the forehead.  Negative for itching and rash.   Neurological: Negative for dizziness, extremity weakness, gait problem, headaches, light-headedness and seizures.  Hematological: Negative for adenopathy. Does not bruise/bleed easily.  Psychiatric/Behavioral: Negative for confusion, depression and sleep disturbance. The patient is not nervous/anxious.     PHYSICAL EXAMINATION:  Blood pressure 99/65, pulse 75, temperature 97.8 F (36.6 C), temperature source Tympanic, resp. rate 17, height '6\' 3"'  (1.905 m), weight 142 lb (64.4 kg), SpO2 97 %.  ECOG PERFORMANCE STATUS: 1  Physical Exam  Constitutional: Oriented to person, place, and time and thin appearing male, and in no distress. HENT: Head: Normocephalic and atraumatic. Mouth/Throat: Oropharynx is clear and moist. No oropharyngeal exudate. Eyes: Conjunctivae are normal. Right eye exhibits no discharge. Left eye exhibits no discharge. No scleral icterus. Neck: Normal range of motion. Neck supple. Cardiovascular: Systolic murmur auscultated in his posterior left thorax. Normal rate, regular rhythm and intact distal pulses.   Pulmonary/Chest: Effort normal and breath sounds normal. No respiratory distress. No wheezes. No rales. Abdominal: Soft. Bowel sounds are normal. Exhibits no distension and no mass. There is no tenderness.  Musculoskeletal: Normal range of motion. Exhibits no edema.  Lymphadenopathy:    No cervical adenopathy.  Neurological: Alert and oriented to person, place, and time. Exhibits normal muscle tone. Gait normal. Coordination normal. Skin: Skin is warm and dry. No rash noted. Not diaphoretic. No erythema. No pallor.  Psychiatric: Mood, memory and judgment normal. Vitals reviewed  LABORATORY DATA: Lab Results  Component Value Date   WBC 7.6 10/25/2020   HGB 15.6 10/25/2020   HCT 48.1 10/25/2020   MCV 87.3 10/25/2020   PLT 266 10/25/2020      Chemistry      Component Value Date/Time   NA 132 (L) 10/25/2020 1431   NA 132 (L) 10/05/2019 0830   NA 138  01/11/2017 1343   K 4.6 10/25/2020 1431   K 4.7 01/11/2017 1343   CL 106 10/25/2020 1431   CO2 21 (L) 10/25/2020 1431   CO2 23 01/11/2017 1343   BUN 40 (H) 10/25/2020 1431   BUN 14 10/05/2019 0830   BUN 20.2 01/11/2017 1343   CREATININE 2.33 (H) 10/25/2020 1431   CREATININE 1.76 (H) 09/28/2020 0906   CREATININE 1.6 (H) 01/11/2017 1343      Component Value Date/Time   CALCIUM 9.8 10/25/2020 1431   CALCIUM 9.3 01/11/2017 1343   ALKPHOS 72 10/25/2020 1431   ALKPHOS 89 01/11/2017 1343   AST 18 10/25/2020 1431   AST 16 09/28/2020 0906   AST 41 (H) 01/11/2017 1343   ALT 15 10/25/2020 1431   ALT 8 09/28/2020 0906   ALT 27 01/11/2017 1343   BILITOT 0.5 10/25/2020 1431   BILITOT 0.5 09/28/2020 0906   BILITOT 0.49 01/11/2017 1343       RADIOGRAPHIC STUDIES:  CT Abdomen Pelvis Wo  Contrast  Result Date: 10/18/2020 CLINICAL DATA:  Primary Cancer Type: Lung Imaging Indication: Assess response to therapy Interval therapy since last imaging? Yes Initial Cancer Diagnosis Date: 03/29/2016; Established by: Biopsy-proven Detailed Pathology: Stage Ib non-small cell lung cancer, adenocarcinoma. Primary Tumor location:  Left lower lobe. Recurrence? Yes; Date(s) of recurrence: 12/02/2017; Established by: Biopsy-proven; metastasis to T4. Surgeries: Left lower lobectomy and left upper lobe wedge resection, with stapling of apical bleb, 05/11/2016. AAA stent graft 2017. Chemotherapy: Yes; Ongoing? No; Most recent administration: 06/30/2020 Immunotherapy?  Yes; Type: Keytruda; Ongoing? Yes Radiation therapy? No EXAM: CT CHEST, ABDOMEN AND PELVIS WITHOUT CONTRAST TECHNIQUE: Multidetector CT imaging of the chest, abdomen and pelvis was performed following the standard protocol without IV contrast. COMPARISON:  Most recent CT chest, abdomen and pelvis 07/19/2020. 11/12/2017 PET-CT. FINDINGS: CT CHEST FINDINGS Cardiovascular: Right chest port catheter. Aortic atherosclerosis. Unchanged enlargement of the tubular  ascending thoracic aorta, measuring up to 4.2 x 4.1 cm. Unchanged enlargement of the distal aortic arch measuring up to 4.2 x 4.0 cm (series 6, image 125). Normal heart size. No pericardial effusion. Mediastinum/Nodes: No enlarged mediastinal, hilar, or axillary lymph nodes. Thyroid gland, trachea, and esophagus demonstrate no significant findings. Lungs/Pleura: Moderate centrilobular and paraseptal emphysema. Diffuse bilateral bronchial wall thickening. Redemonstrated postoperative findings of left lower lobectomy and wedge resection of the posterior left upper lobe. There are multiple new bilateral nodular opacities, for example in the anterior right upper lobe measuring 0.5 cm (series 4, image 70). There are some slightly enlarged pulmonary nodules, for example a 1.0 x 0.5 cm nodule of the peripheral left upper lobe, previously no greater than 0.7 x 0.3 cm (series 4, image 69) and a 0.8 x 0.6 nodule of the anterior right upper lobe, previously 0.7 x 0.5 cm (series 4, image 80). There is new, relatively diffuse interstitial opacity and very fine nodularity, generally in a perilymphatic distribution (series 4, image [phone]). Unchanged small, chronic, loculated left pleural effusion. Pleural thickening and calcification of the dependent right pleura (series 4, image 93). Musculoskeletal: No chest wall mass. Unchanged, mildly expansile sclerotic lesion of the spinous process of T4 (series 4, image 105). CT ABDOMEN PELVIS FINDINGS Hepatobiliary: No solid liver abnormality is seen. No gallstones, gallbladder wall thickening, or biliary dilatation. Pancreas: Unremarkable. No pancreatic ductal dilatation or surrounding inflammatory changes. Spleen: Normal in size without significant abnormality. Adrenals/Urinary Tract: Adrenal glands are unremarkable. Kidneys are normal, without renal calculi, solid lesion, or hydronephrosis. Bladder is unremarkable. Stomach/Bowel: Stomach is within normal limits. Appendix appears  normal. No evidence of bowel wall thickening, distention, or inflammatory changes. Vascular/Lymphatic: Aortic atherosclerosis. Status post aortobiiliac stent endograft repair. No enlarged abdominal or pelvic lymph nodes. Reproductive: No mass or other abnormality. Other: No abdominal wall hernia or abnormality. No abdominopelvic ascites. Musculoskeletal: No acute or significant osseous findings. IMPRESSION: 1. Redemonstrated postoperative findings of left lower lobectomy and wedge resection of the posterior left upper lobe. 2. There are multiple new and enlarged bilateral nodular opacities. There is new, relatively diffuse interstitial opacity and very fine nodularity, generally in a perilymphatic distribution. Findings are highly suspicious for pulmonary metastatic disease, including lymphangitic involvement, however nonspecific infection or inflammation is a differential consideration. 3. Unchanged small, chronic, loculated left pleural effusion. 4. No noncontrast evidence of metastatic disease in the abdomen or pelvis. 5. Unchanged mildly expansile sclerotic lesion of the spinous process of T4. No other findings to suggest osseous metastatic disease. 6. Emphysema and diffuse bilateral bronchial wall thickening, consistent with nonspecific  infectious or inflammatory bronchitis. 7. 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 Unchanged enlargement of the distal aortic arch, measuring up to 4.2 x 4.0 cm. Recommend semi-annual imaging followup by CTA or MRA, if not otherwise imaged, and referral to cardiothoracic surgery if not already obtained. This recommendation follows 2010 ACCF/AHA/AATS/ACR/ASA/SCA/SCAI/SIR/STS/SVM Guidelines for the Diagnosis and Management of Patients With Thoracic Aortic Disease. Circulation. 2010; 121: A250-N39. Aortic aneurysm NOS (ICD10-I71.9) 8. Status post aortobiiliac stent endograft repair. Aortic Atherosclerosis (ICD10-I70.0) and  Emphysema (ICD10-J43.9). Electronically Signed   By: Eddie Candle M.D.   On: 10/18/2020 10:28   CT Chest Wo Contrast  Result Date: 10/18/2020 CLINICAL DATA:  Primary Cancer Type: Lung Imaging Indication: Assess response to therapy Interval therapy since last imaging? Yes Initial Cancer Diagnosis Date: 03/29/2016; Established by: Biopsy-proven Detailed Pathology: Stage Ib non-small cell lung cancer, adenocarcinoma. Primary Tumor location:  Left lower lobe. Recurrence? Yes; Date(s) of recurrence: 12/02/2017; Established by: Biopsy-proven; metastasis to T4. Surgeries: Left lower lobectomy and left upper lobe wedge resection, with stapling of apical bleb, 05/11/2016. AAA stent graft 2017. Chemotherapy: Yes; Ongoing? No; Most recent administration: 06/30/2020 Immunotherapy?  Yes; Type: Keytruda; Ongoing? Yes Radiation therapy? No EXAM: CT CHEST, ABDOMEN AND PELVIS WITHOUT CONTRAST TECHNIQUE: Multidetector CT imaging of the chest, abdomen and pelvis was performed following the standard protocol without IV contrast. COMPARISON:  Most recent CT chest, abdomen and pelvis 07/19/2020. 11/12/2017 PET-CT. FINDINGS: CT CHEST FINDINGS Cardiovascular: Right chest port catheter. Aortic atherosclerosis. 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 Unchanged enlargement of the distal aortic arch measuring up to 4.2 x 4.0 cm (series 6, image 125). Normal heart size. No pericardial effusion. Mediastinum/Nodes: No enlarged mediastinal, hilar, or axillary lymph nodes. Thyroid gland, trachea, and esophagus demonstrate no significant findings. Lungs/Pleura: Moderate centrilobular and paraseptal emphysema. Diffuse bilateral bronchial wall thickening. Redemonstrated postoperative findings of left lower lobectomy and wedge resection of the posterior left upper lobe. There are multiple new bilateral nodular opacities, for example in the anterior right upper lobe measuring 0.5 cm (series 4,  image 70). There are some slightly enlarged pulmonary nodules, for example a 1.0 x 0.5 cm nodule of the peripheral left upper lobe, previously no greater than 0.7 x 0.3 cm (series 4, image 69) and a 0.8 x 0.6 nodule of the anterior right upper lobe, previously 0.7 x 0.5 cm (series 4, image 80). There is new, relatively diffuse interstitial opacity and very fine nodularity, generally in a perilymphatic distribution (series 4, image [phone]). Unchanged small, chronic, loculated left pleural effusion. Pleural thickening and calcification of the dependent right pleura (series 4, image 93). Musculoskeletal: No chest wall mass. Unchanged, mildly expansile sclerotic lesion of the spinous process of T4 (series 4, image 105). CT ABDOMEN PELVIS FINDINGS Hepatobiliary: No solid liver abnormality is seen. No gallstones, gallbladder wall thickening, or biliary dilatation. Pancreas: Unremarkable. No pancreatic ductal dilatation or surrounding inflammatory changes. Spleen: Normal in size without significant abnormality. Adrenals/Urinary Tract: Adrenal glands are unremarkable. Kidneys are normal, without renal calculi, solid lesion, or hydronephrosis. Bladder is unremarkable. Stomach/Bowel: Stomach is within normal limits. Appendix appears normal. No evidence of bowel wall thickening, distention, or inflammatory changes. Vascular/Lymphatic: Aortic atherosclerosis. Status post aortobiiliac stent endograft repair. No enlarged abdominal or pelvic lymph nodes. Reproductive: No mass or other abnormality. Other: No abdominal wall hernia or abnormality. No abdominopelvic ascites. Musculoskeletal: No acute or significant osseous findings. IMPRESSION: 1. Redemonstrated  postoperative findings of left lower lobectomy and wedge resection of the posterior left upper lobe. 2. There are multiple new and enlarged bilateral nodular opacities. There is new, relatively diffuse interstitial opacity and very fine nodularity, generally in a  perilymphatic distribution. Findings are highly suspicious for pulmonary metastatic disease, including lymphangitic involvement, however nonspecific infection or inflammation is a differential consideration. 3. Unchanged small, chronic, loculated left pleural effusion. 4. No noncontrast evidence of metastatic disease in the abdomen or pelvis. 5. Unchanged mildly expansile sclerotic lesion of the spinous process of T4. No other findings to suggest osseous metastatic disease. 6. Emphysema and diffuse bilateral bronchial wall thickening, consistent with nonspecific infectious or inflammatory bronchitis. 7. 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 Unchanged enlargement of the distal aortic arch, measuring up to 4.2 x 4.0 cm. Recommend semi-annual imaging followup by CTA or MRA, if not otherwise imaged, and referral to cardiothoracic surgery if not already obtained. This recommendation follows 2010 ACCF/AHA/AATS/ACR/ASA/SCA/SCAI/SIR/STS/SVM Guidelines for the Diagnosis and Management of Patients With Thoracic Aortic Disease. Circulation. 2010; 121: F573-U20. Aortic aneurysm NOS (ICD10-I71.9) 8. Status post aortobiiliac stent endograft repair. Aortic Atherosclerosis (ICD10-I70.0) and Emphysema (ICD10-J43.9). Electronically Signed   By: Eddie Candle M.D.   On: 10/18/2020 10:28     ASSESSMENT/PLAN:  This is a very pleasant 65 year old African American male with metastatic disease who was initially diagnosed with stage IB non-small cell lung cancer, adenocarcinoma of the left upper lobe. Molecular studies showed KRAS G12C mutation which can be targetted in the second line setting in the future. PD-L1 expression was negative. First diagnosed December 2017.   He was status post a wedge resection of the left upper lobe followed by 4 cycles of adjuvant systemic chemotherapy with cisplatin and Alimta. He tolerated treatment well except for fatigue. He had been on  observation until June of 2018. 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   The is currently undergoing systemic chemotherapy with carboplatin, alimta, and keytruda. He is status post 47 cycles. Starting from cycle #5, he has been on maintenance Alimta and Keytruda every 3 weeks. His Alimta has been held during a few cycles of treatment due to renal insufficiency. It was ultimately discontinued due to renal insufficiency.   The patient recently had a restaging CT scan performed.   Dr. Julien Nordmann personally and independently reviewed the scan discussed results with the patient today.  The scan showed multiple new enlarged bilateral nodular opacities.  In the absence of any recent infections, pneumonia, or viral infections this remains suspicious for metastatic disease.   Dr. Julien Nordmann recommends that we change his treatment to targeted treatment with Lumakras 960 mg p.o. daily.  The adverse side effects were discussed including but not limited to nausea, vomiting, diarrhea, liver, and kidney dysfunction.  The patient is interested in's option and he is expected to receive his first dose of treatment in the next few days.  We will arrange for the patient to have a formal teaching session with the oral chemotherapy pharmacist.  The patient is interested in having this performed with his wife present.  They will likely call him for the education session.  In the meantime, the patient's labs today show significant dehydration due to his recent bout of diarrhea which is resolved at this time.  We will arrange for the  patient to receive 1 L of fluids while in the clinic today.  He was also strongly encouraged to increase his oral fluid intake.  We will see the patient back for follow-up visit in 2 and half weeks for evaluation and repeat blood work and to manage any adverse  side effects of treatment.  The patient was advised to call immediately if he has any concerning symptoms in the interval. The patient voices understanding of current disease status and treatment options and is in agreement with the current care plan. All questions were answered. The patient knows to call the clinic with any problems, questions or concerns. We can certainly see the patient much sooner if necessary  Orders Placed This Encounter  Procedures   CMP ([facility] only)    Standing Status:   Standing    Number of Occurrences:   12    Standing Expiration Date:   10/25/2021   TSH    Standing Status:   Standing    Number of Occurrences:   12    Standing Expiration Date:   10/25/2021       Jazae Gandolfi L Ryne Mctigue, PA-C 10/25/20  ADDENDUM: Hematology/Oncology Attending: I had a face-to-face encounter with the patient.  I reviewed his record, lab and scan and recommended his care plan.  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  Molecular studies showed positive K-ras G12C mutation. The patient underwent systemic chemotherapy with induction carboplatin, Alimta and Keytruda for 4 cycles followed by maintenance treatment with Alimta and Keytruda status post a total dose of 47 cycles.  The last few doses was giving with single agent Keytruda secondary to renal insufficiency. The patient had repeat CT scan of the chest, abdomen pelvis performed recently.  I personally and independently reviewed the scan images and discussed the results with the patient today. Unfortunately his scan showed evidence for disease progression with increased nodularity in the lungs bilaterally. I recommended for the patient to discontinue his maintenance treatment with Keytruda at this point. I  discussed with the patient other treatment options and because of the K-ras G12C mutation, I recommended for the patient treatment with Lumakras (Sotorasib) 960 mg p.o. daily. The patient is interested in the treatment.  I discussed with him the adverse effect of this treatment including but not limited to diarrhea, rash as well as liver dysfunction. The patient will see the pharmacist for oral oncolytic later today to provide him with education as well as help him with the prescription refill. We will see him back for follow-up visit in around 2 weeks for evaluation and repeat blood work. He was advised to call immediately if he has any concerning symptoms in the interval. The total time spent in the appointment was 35 minutes.  Disclaimer: This note was dictated with voice recognition software. Similar sounding words can inadvertently be transcribed and may be missed upon review. Eilleen Kempf, MD 10/25/20

## 2020-10-25 ENCOUNTER — Inpatient Hospital Stay: Payer: Federal, State, Local not specified - PPO

## 2020-10-25 ENCOUNTER — Other Ambulatory Visit (HOSPITAL_COMMUNITY): Payer: Self-pay

## 2020-10-25 ENCOUNTER — Other Ambulatory Visit: Payer: Self-pay | Admitting: Internal Medicine

## 2020-10-25 ENCOUNTER — Telehealth: Payer: Self-pay

## 2020-10-25 ENCOUNTER — Inpatient Hospital Stay: Payer: Federal, State, Local not specified - PPO | Attending: Internal Medicine

## 2020-10-25 ENCOUNTER — Other Ambulatory Visit: Payer: Self-pay | Admitting: Physician Assistant

## 2020-10-25 ENCOUNTER — Other Ambulatory Visit: Payer: Self-pay

## 2020-10-25 ENCOUNTER — Other Ambulatory Visit: Payer: Self-pay | Admitting: *Deleted

## 2020-10-25 ENCOUNTER — Encounter: Payer: Self-pay | Admitting: Internal Medicine

## 2020-10-25 ENCOUNTER — Inpatient Hospital Stay (HOSPITAL_BASED_OUTPATIENT_CLINIC_OR_DEPARTMENT_OTHER): Payer: Federal, State, Local not specified - PPO | Admitting: Physician Assistant

## 2020-10-25 VITALS — BP 99/65 | HR 75 | Temp 97.8°F | Resp 17 | Ht 75.0 in | Wt 142.0 lb

## 2020-10-25 DIAGNOSIS — C3412 Malignant neoplasm of upper lobe, left bronchus or lung: Secondary | ICD-10-CM | POA: Insufficient documentation

## 2020-10-25 DIAGNOSIS — C3492 Malignant neoplasm of unspecified part of left bronchus or lung: Secondary | ICD-10-CM

## 2020-10-25 DIAGNOSIS — N289 Disorder of kidney and ureter, unspecified: Secondary | ICD-10-CM | POA: Diagnosis not present

## 2020-10-25 DIAGNOSIS — E860 Dehydration: Secondary | ICD-10-CM | POA: Diagnosis not present

## 2020-10-25 DIAGNOSIS — C3432 Malignant neoplasm of lower lobe, left bronchus or lung: Secondary | ICD-10-CM

## 2020-10-25 DIAGNOSIS — R197 Diarrhea, unspecified: Secondary | ICD-10-CM | POA: Insufficient documentation

## 2020-10-25 DIAGNOSIS — Z79899 Other long term (current) drug therapy: Secondary | ICD-10-CM | POA: Insufficient documentation

## 2020-10-25 DIAGNOSIS — Z95828 Presence of other vascular implants and grafts: Secondary | ICD-10-CM

## 2020-10-25 LAB — COMPREHENSIVE METABOLIC PANEL
ALT: 15 U/L (ref 0–44)
AST: 18 U/L (ref 15–41)
Albumin: 3 g/dL — ABNORMAL LOW (ref 3.5–5.0)
Alkaline Phosphatase: 72 U/L (ref 38–126)
Anion gap: 5 (ref 5–15)
BUN: 40 mg/dL — ABNORMAL HIGH (ref 8–23)
CO2: 21 mmol/L — ABNORMAL LOW (ref 22–32)
Calcium: 9.8 mg/dL (ref 8.9–10.3)
Chloride: 106 mmol/L (ref 98–111)
Creatinine, Ser: 2.33 mg/dL — ABNORMAL HIGH (ref 0.61–1.24)
GFR, Estimated: 30 mL/min — ABNORMAL LOW (ref >60)
Glucose, Bld: 99 mg/dL (ref 70–99)
Potassium: 4.6 mmol/L (ref 3.5–5.1)
Sodium: 132 mmol/L — ABNORMAL LOW (ref 135–145)
Total Bilirubin: 0.5 mg/dL (ref 0.3–1.2)
Total Protein: 7.2 g/dL (ref 6.5–8.1)

## 2020-10-25 LAB — CBC WITH DIFFERENTIAL (CANCER CENTER ONLY)
Abs Immature Granulocytes: 0.06 10*3/uL (ref 0.00–0.07)
Basophils Absolute: 0.1 10*3/uL (ref 0.0–0.1)
Basophils Relative: 1 %
Eosinophils Absolute: 0.4 10*3/uL (ref 0.0–0.5)
Eosinophils Relative: 6 %
HCT: 48.1 % (ref 39.0–52.0)
Hemoglobin: 15.6 g/dL (ref 13.0–17.0)
Immature Granulocytes: 1 %
Lymphocytes Relative: 13 %
Lymphs Abs: 1 10*3/uL (ref 0.7–4.0)
MCH: 28.3 pg (ref 26.0–34.0)
MCHC: 32.4 g/dL (ref 30.0–36.0)
MCV: 87.3 fL (ref 80.0–100.0)
Monocytes Absolute: 1 10*3/uL (ref 0.1–1.0)
Monocytes Relative: 13 %
Neutro Abs: 5.1 10*3/uL (ref 1.7–7.7)
Neutrophils Relative %: 66 %
Platelet Count: 266 10*3/uL (ref 150–400)
RBC: 5.51 MIL/uL (ref 4.22–5.81)
RDW: 13.2 % (ref 11.5–15.5)
WBC Count: 7.6 10*3/uL (ref 4.0–10.5)
nRBC: 0 % (ref 0.0–0.2)

## 2020-10-25 MED ORDER — SODIUM CHLORIDE 0.9% FLUSH
10.0000 mL | Freq: Once | INTRAVENOUS | Status: AC
  Administered 2020-10-25: 10 mL
  Filled 2020-10-25: qty 10

## 2020-10-25 MED ORDER — HEPARIN SOD (PORK) LOCK FLUSH 100 UNIT/ML IV SOLN
500.0000 [IU] | Freq: Once | INTRAVENOUS | Status: AC
Start: 2020-10-25 — End: 2020-10-25
  Administered 2020-10-25: 500 [IU]
  Filled 2020-10-25: qty 5

## 2020-10-25 MED ORDER — LUMAKRAS 120 MG PO TABS
960.0000 mg | ORAL_TABLET | Freq: Every day | ORAL | 3 refills | Status: DC
  Filled 2020-10-25: qty 240, fill #0
  Filled 2020-10-27: qty 240, 30d supply, fill #0

## 2020-10-25 MED ORDER — SODIUM CHLORIDE 0.9 % IV SOLN
Freq: Once | INTRAVENOUS | Status: AC
  Filled 2020-10-25: qty 250

## 2020-10-25 NOTE — Patient Instructions (Signed)

## 2020-10-25 NOTE — Patient Instructions (Signed)
Dehydration, Adult Dehydration is condition in which there is not enough water or other fluids in the body. This happens when a person loses more fluids than he or she takes in. Important body parts cannot work right without the right amount of fluids. Anyloss of fluids from the body can cause dehydration. Dehydration can be mild, worse, or very bad. It should be treated right away tokeep it from getting very bad. What are the causes? This condition may be caused by: Conditions that cause loss of water or other fluids, such as: Watery poop (diarrhea). Vomiting. Sweating a lot. Peeing (urinating) a lot. Not drinking enough fluids, especially when you: Are ill. Are doing things that take a lot of energy to do. Other illnesses and conditions, such as fever or infection. Certain medicines, such as medicines that take extra fluid out of the body (diuretics). Lack of safe drinking water. Not being able to get enough water and food. What increases the risk? The following factors may make you more likely to develop this condition: Having a long-term (chronic) illness that has not been treated the right way, such as: Diabetes. Heart disease. Kidney disease. Being 67 years of age or older. Having a disability. Living in a place that is high above the ground or sea (high in altitude). The thinner, dried air causes more fluid loss. Doing exercises that put stress on your body for a long time. What are the signs or symptoms? Symptoms of dehydration depend on how bad it is. Mild or worse dehydration Thirst. Dry lips or dry mouth. Feeling dizzy or light-headed, especially when you stand up from sitting. Muscle cramps. Your body making: Dark pee (urine). Pee may be the color of tea. Less pee than normal. Less tears than normal. Headache. Very bad dehydration Changes in skin. Skin may: Be cold to the touch (clammy). Be blotchy or pale. Not go back to normal right after you lightly pinch it  and let it go. Little or no tears, pee, or sweat. Changes in vital signs, such as: Fast breathing. Low blood pressure. Weak pulse. Pulse that is more than 100 beats a minute when you are sitting still. Other changes, such as: Feeling very thirsty. Eyes that look hollow (sunken). Cold hands and feet. Being mixed up (confused). Being very tired (lethargic) or having trouble waking from sleep. Short-term weight loss. Loss of consciousness. How is this treated? Treatment for this condition depends on how bad it is. Treatment should start right away. Do not wait until your condition gets very bad. Very bad dehydration is an emergency. You will need to go to a hospital. Mild or worse dehydration can be treated at home. You may be asked to: Drink more fluids. Drink an oral rehydration solution (ORS). This drink helps get the right amounts of fluids and salts and minerals in the blood (electrolytes). Very bad dehydration can be treated: With fluids through an IV tube. By getting normal levels of salts and minerals in your blood. This is often done by giving salts and minerals through a tube. The tube is passed through your nose and into your stomach. By treating the root cause. Follow these instructions at home: Oral rehydration solution If told by your doctor, drink an ORS: Make an ORS. Use instructions on the package. Start by drinking small amounts, about  cup (120 mL) every 5-10 minutes. Slowly drink more until you have had the amount that your doctor said to have. Eating and drinking  Drink enough clear fluid to keep your pee pale yellow. If you were told to drink an ORS, finish the ORS first. Then, start slowly drinking other clear fluids. Drink fluids such as: Water. Do not drink only water. Doing that can make the salt (sodium) level in your body get too low. Water from ice chips you suck on. Fruit juice that you have added water to (diluted). Low-calorie sports  drinks. Eat foods that have the right amounts of salts and minerals, such as: Bananas. Oranges. Potatoes. Tomatoes. Spinach. Do not drink alcohol. Avoid: Drinks that have a lot of sugar. These include: High-calorie sports drinks. Fruit juice that you did not add water to. Soda. Caffeine. Foods that are greasy or have a lot of fat or sugar. General instructions Take over-the-counter and prescription medicines only as told by your doctor. Do not take salt tablets. Doing that can make the salt level in your body get too high. Return to your normal activities as told by your doctor. Ask your doctor what activities are safe for you. Keep all follow-up visits as told by your doctor. This is important. Contact a doctor if: You have pain in your belly (abdomen) and the pain: Gets worse. Stays in one place. You have a rash. You have a stiff neck. You get angry or annoyed (irritable) more easily than normal. You are more tired or have a harder time waking than normal. You feel: Weak or dizzy. Very thirsty. Get help right away if you have: Any symptoms of very bad dehydration. Symptoms of vomiting, such as: You cannot eat or drink without vomiting. Your vomiting gets worse or does not go away. Your vomit has blood or green stuff in it. Symptoms that get worse with treatment. A fever. A very bad headache. Problems with peeing or pooping (having a bowel movement), such as: Watery poop that gets worse or does not go away. Blood in your poop (stool). This may cause poop to look black and tarry. Not peeing in 6-8 hours. Peeing only a small amount of very dark pee in 6-8 hours. Trouble breathing. These symptoms may be an emergency. Do not wait to see if the symptoms will go away. Get medical help right away. Call your local emergency services (911 in the [place] Do not drive yourself to the hospital. Summary Dehydration is a condition in which there is not enough water or other fluids  in the body. This happens when a person loses more fluids than he or she takes in. Treatment for this condition depends on how bad it is. Treatment should be started right away. Do not wait until your condition gets very bad. Drink enough clear fluid to keep your pee pale yellow. If you were told to drink an oral rehydration solution (ORS), finish the ORS first. Then, start slowly drinking other clear fluids. Take over-the-counter and prescription medicines only as told by your doctor. Get help right away if you have any symptoms of very bad dehydration. This information is not intended to replace advice given to you by your health care provider. Make sure you discuss any questions you have with your healthcare provider. Document Revised: 11/20/2018 Document Reviewed: 11/20/2018 Elsevier Patient Education  [place].

## 2020-10-25 NOTE — Addendum Note (Signed)
Addended by: Shanigua Gibb L on: 10/25/2020 02:51 PM   Modules accepted: Orders

## 2020-10-25 NOTE — Telephone Encounter (Signed)
Oral Oncology Patient Advocate Encounter  Prior Authorization for [name] has been approved.    PA# [id number] Effective dates: 09/25/20 through 10/25/21   Patients co-pay is $0 One time at [facility] then [facility]    Oral Oncology Clinic will continue to follow.   [name] [place] Phone [phone] Fax [phone] 10/25/2020 3:57 PM

## 2020-10-25 NOTE — Progress Notes (Signed)
DISCONTINUE ON PATHWAY REGIMEN - Non-Small Cell Lung     A cycle is every 21 days:     Pembrolizumab      Pemetrexed      Carboplatin   **Always confirm dose/schedule in your pharmacy ordering system**  REASON: Disease Progression PRIOR TREATMENT: [id number]: Pembrolizumab 200 mg + Pemetrexed 500 mg/m2 + Carboplatin AUC=5 q21 Days x 4-6 Cycles TREATMENT RESPONSE: Partial Response (PR)  START OFF PATHWAY REGIMEN - Non-Small Cell Lung   [id number]:Sotorasib 960 mg PO Daily D1-28 q28 Days:   A cycle is every 28 days:     Sotorasib   **Always confirm dose/schedule in your pharmacy ordering system**  Patient Characteristics: Stage IV Metastatic, Nonsquamous, Molecular Analysis Completed, Molecular Alteration Present and Targeted Therapy Exhausted OR EGFR Exon 20+ or KRAS G12C+ Present and No Prior Chemo/Immunotherapy OR No Alteration Present, Second Line -  Chemotherapy/Immunotherapy, PS = 0, 1, No Prior PD-1/PD-L1  Inhibitor or Prior PD-1/PD-L1 Inhibitor + Chemotherapy, and Not a Candidate for Immunotherapy Therapeutic Status: Stage IV Metastatic Histology: Nonsquamous Cell Broad Molecular Profiling Status: Molecular Analysis Completed Molecular Analysis Results: EGFR Exon 20 Insertion Present or KRAS G12C Present, and No Prior Chemo/Immunotherapy ECOG Performance Status: 1 Chemotherapy/Immunotherapy Line of Therapy: Second Line Chemotherapy/Immunotherapy Immunotherapy Candidate Status: Not a Candidate for Immunotherapy Prior Immunotherapy Status: Prior PD-1/PD-L1 Inhibitor + Chemotherapy Intent of Therapy: Non-Curative / Palliative Intent, Discussed with Patient

## 2020-10-25 NOTE — Addendum Note (Signed)
Addended by: Taeja Debellis L on: 10/25/2020 03:47 PM   Modules accepted: Orders

## 2020-10-25 NOTE — Telephone Encounter (Signed)
Oral Oncology Patient Advocate Encounter   Received notification from [place] that prior authorization for Lumakras is required.   PA submitted on CoverMyMeds Key [id number] Status is pending   Oral Oncology Clinic will continue to follow.   [name] Patient [place] Phone [phone] Fax [phone] 10/25/2020 3:54 PM

## 2020-10-26 ENCOUNTER — Telehealth: Payer: Self-pay | Admitting: Physician Assistant

## 2020-10-26 ENCOUNTER — Telehealth: Payer: Self-pay | Admitting: Pharmacist

## 2020-10-26 ENCOUNTER — Other Ambulatory Visit (HOSPITAL_COMMUNITY): Payer: Self-pay

## 2020-10-26 DIAGNOSIS — C3492 Malignant neoplasm of unspecified part of left bronchus or lung: Secondary | ICD-10-CM

## 2020-10-26 LAB — TSH: TSH: 1.931 u[IU]/mL (ref 0.320–4.118)

## 2020-10-26 NOTE — Telephone Encounter (Addendum)
Oral Oncology Pharmacist Encounter  Received new prescription for Lumakras (sotorasib) for the treatment of metastatic, non-small cell lung cancer, KRAS G12C-mutated, planned duration until disease progression or unacceptable drug toxicity.  Prescription dose and frequency assessed for appropriateness. Appropriate for therapy initiation.   CBC w/ Diff and CMP from 10/25/20 assessed, noted pt with baseline renal impairment (Scr 2.33 mg/dL, CrCl ~28 mL/min), no renal dose adjustments required per package insert.  Current medication list in Epic reviewed, no relevant/significant DDIs with Lumakras identified.  Evaluated chart and no patient barriers to medication adherence noted.   Patient agreement for treatment documented in MD note on 10/25/20.  Patient allowed first fill of Lumakras through [facility] per insurance. Subsequent fills will have to be dispensed [facility] [place], [place]   Oral Oncology Clinic will continue to follow for insurance authorization, copayment issues, initial counseling and start date.  Leron Croak, PharmD, BCPS Hematology/Oncology Clinical Pharmacist [facility] [phone] 10/26/2020 7:59 AM

## 2020-10-26 NOTE — Telephone Encounter (Signed)
Scheduled per los. Called and spoke with patient. Confirmed appt 

## 2020-10-27 ENCOUNTER — Other Ambulatory Visit (HOSPITAL_COMMUNITY): Payer: Self-pay

## 2020-10-27 NOTE — Telephone Encounter (Signed)
Oral Chemotherapy Pharmacist Encounter   I spoke with patient for overview of new oral chemotherapy medication: Lumakras (sotorasib) for the treatment of metastatic non-small cell lung cancer, KRAS G12C-mutated, planned duration until disease progression or unacceptable drug toxicity.   Pt is doing well. Counseled patient on administration, dosing, side effects, monitoring, drug-food interactions, safe handling, storage, and disposal.   Patient will take Lumakras 120 mg tablets, 8 tablets (960 mg total) by mouth daily.   Patient knows to avoid proton pump inhibitors, histamine blockers, and grapefruit/grapefruit juice while on Lumakras.    Start date: 10/29/20    Side effects include but are not limited to: decrease in blood counts, diarrhea, hepatotoxicity, arthralgias/musculoskeletal pains. Also reviewed rare but serious side effect of interstitial lung disease/pneumonitis that have been reported with use of medication.    Reviewed with patient importance of keeping a medication schedule and plan for any missed doses.   After discussion with patient no patient barriers to medication adherence identified.    Insurance authorization for Truman Hayward has been obtained. Test claim at the pharmacy revealed copayment $0 for 1st fill of Lumakras. This will ship from the [place] on 10/27/20 to deliver to patient's home on 10/28/20.   Subsequent fills will be dispensed from [facility]. Provided patient with phone number to [facility] (tele: [phone]) and instructed him to call and set up his next shipment of medication when he has 10 days of medication left.   All questions answered.  Mr. Frett voiced understanding and appreciation.   Medication education handout placed in mail for patient. Patient knows to call the office with questions or concerns. Oral Chemotherapy Clinic phone number provided to patient.  Leron Croak, PharmD,  BCPS Hematology/Oncology Clinical Pharmacist [facility] [phone] 10/27/2020 12:53 PM

## 2020-10-28 ENCOUNTER — Telehealth: Payer: Self-pay | Admitting: Internal Medicine

## 2020-10-28 ENCOUNTER — Telehealth: Payer: Self-pay | Admitting: Medical Oncology

## 2020-10-28 MED ORDER — LUMAKRAS 120 MG PO TABS: 960.0000 mg | ORAL_TABLET | Freq: Every day | ORAL | 2 refills | Status: DC

## 2020-10-28 NOTE — Telephone Encounter (Signed)
R/s appt per 7/8 sch msg. Pt aware.

## 2020-10-28 NOTE — Telephone Encounter (Signed)
Confirmed next appt.

## 2020-10-31 ENCOUNTER — Other Ambulatory Visit: Payer: Self-pay

## 2020-10-31 MED ORDER — CARVEDILOL 6.25 MG PO TABS: 6.2500 mg | ORAL_TABLET | Freq: Two times a day (BID) | ORAL | 3 refills | Status: DC

## 2020-11-01 ENCOUNTER — Other Ambulatory Visit: Payer: Federal, State, Local not specified - PPO

## 2020-11-08 ENCOUNTER — Other Ambulatory Visit: Payer: Federal, State, Local not specified - PPO

## 2020-11-10 ENCOUNTER — Ambulatory Visit: Payer: Federal, State, Local not specified - PPO | Admitting: Physician Assistant

## 2020-11-10 ENCOUNTER — Ambulatory Visit: Payer: Federal, State, Local not specified - PPO

## 2020-11-10 ENCOUNTER — Other Ambulatory Visit: Payer: Federal, State, Local not specified - PPO

## 2020-11-14 ENCOUNTER — Inpatient Hospital Stay (HOSPITAL_BASED_OUTPATIENT_CLINIC_OR_DEPARTMENT_OTHER): Payer: Federal, State, Local not specified - PPO | Admitting: Internal Medicine

## 2020-11-14 ENCOUNTER — Inpatient Hospital Stay: Payer: Federal, State, Local not specified - PPO

## 2020-11-14 ENCOUNTER — Other Ambulatory Visit: Payer: Self-pay

## 2020-11-14 ENCOUNTER — Encounter: Payer: Self-pay | Admitting: Internal Medicine

## 2020-11-14 VITALS — BP 109/71 | HR 89 | Temp 97.6°F | Resp 18 | Ht 75.0 in | Wt 143.6 lb

## 2020-11-14 DIAGNOSIS — C3432 Malignant neoplasm of lower lobe, left bronchus or lung: Secondary | ICD-10-CM | POA: Diagnosis not present

## 2020-11-14 DIAGNOSIS — Z95828 Presence of other vascular implants and grafts: Secondary | ICD-10-CM

## 2020-11-14 DIAGNOSIS — R197 Diarrhea, unspecified: Secondary | ICD-10-CM | POA: Diagnosis not present

## 2020-11-14 DIAGNOSIS — C3412 Malignant neoplasm of upper lobe, left bronchus or lung: Secondary | ICD-10-CM | POA: Diagnosis not present

## 2020-11-14 DIAGNOSIS — N289 Disorder of kidney and ureter, unspecified: Secondary | ICD-10-CM | POA: Diagnosis not present

## 2020-11-14 DIAGNOSIS — Z79899 Other long term (current) drug therapy: Secondary | ICD-10-CM | POA: Diagnosis not present

## 2020-11-14 LAB — CMP (CANCER CENTER ONLY)
ALT: 11 U/L (ref 0–44)
AST: 18 U/L (ref 15–41)
Albumin: 3 g/dL — ABNORMAL LOW (ref 3.5–5.0)
Alkaline Phosphatase: 73 U/L (ref 38–126)
Anion gap: 7 (ref 5–15)
BUN: 28 mg/dL — ABNORMAL HIGH (ref 8–23)
CO2: 25 mmol/L (ref 22–32)
Calcium: 10.6 mg/dL — ABNORMAL HIGH (ref 8.9–10.3)
Chloride: 100 mmol/L (ref 98–111)
Creatinine: 2.03 mg/dL — ABNORMAL HIGH (ref 0.61–1.24)
GFR, Estimated: 35 mL/min — ABNORMAL LOW (ref >60)
Glucose, Bld: 87 mg/dL (ref 70–99)
Potassium: 4.8 mmol/L (ref 3.5–5.1)
Sodium: 132 mmol/L — ABNORMAL LOW (ref 135–145)
Total Bilirubin: 0.5 mg/dL (ref 0.3–1.2)
Total Protein: 7.1 g/dL (ref 6.5–8.1)

## 2020-11-14 LAB — CBC WITH DIFFERENTIAL (CANCER CENTER ONLY)
Abs Immature Granulocytes: 0.04 10*3/uL (ref 0.00–0.07)
Basophils Absolute: 0.1 10*3/uL (ref 0.0–0.1)
Basophils Relative: 1 %
Eosinophils Absolute: 0.7 10*3/uL — ABNORMAL HIGH (ref 0.0–0.5)
Eosinophils Relative: 8 %
HCT: 46 % (ref 39.0–52.0)
Hemoglobin: 15 g/dL (ref 13.0–17.0)
Immature Granulocytes: 1 %
Lymphocytes Relative: 13 %
Lymphs Abs: 1.1 10*3/uL (ref 0.7–4.0)
MCH: 28.2 pg (ref 26.0–34.0)
MCHC: 32.6 g/dL (ref 30.0–36.0)
MCV: 86.5 fL (ref 80.0–100.0)
Monocytes Absolute: 1.1 10*3/uL — ABNORMAL HIGH (ref 0.1–1.0)
Monocytes Relative: 13 %
Neutro Abs: 5.6 10*3/uL (ref 1.7–7.7)
Neutrophils Relative %: 64 %
Platelet Count: 251 10*3/uL (ref 150–400)
RBC: 5.32 MIL/uL (ref 4.22–5.81)
RDW: 13.3 % (ref 11.5–15.5)
WBC Count: 8.5 10*3/uL (ref 4.0–10.5)
nRBC: 0 % (ref 0.0–0.2)

## 2020-11-14 LAB — TSH: TSH: 2.539 u[IU]/mL (ref 0.320–4.118)

## 2020-11-14 NOTE — Progress Notes (Signed)
[place] [phone]   [phone]  OFFICE PROGRESS NOTE  [name], MD [address] [place] [address]  DIAGNOSIS: Metastatic non-small cell lung cancer, adenocarcinoma initially diagnosed as stage IB (T2a, N0, M0) non-small cell lung cancer, adenocarcinoma diagnosed in December 2017.  The patient has evidence for disease recurrence in July 2019.  Biomarker Findings Tumor Mutational Burden - TMB-Intermediate (16 Muts/Mb) Microsatellite status - MS-Stable Genomic Findings For a complete list of the genes assayed, please refer to the Appendix. KIT amplification KRAS K81P SMAD4 splice site [id number]>J HH83 I232F 7 Disease relevant genes with no reportable alterations: EGFR, ALK, BRAF, MET, RET, ERBB2, ROS1   PDL 1 expression is 0%  PRIOR THERAPY:  1) status post left lower lobectomy as well as wedge resection of the left upper lobe on 05/11/2016. 2) Adjuvant systemic chemotherapy with cisplatin 75 MG/M2 and Alimta 500 MG/M2 every 3 weeks. First dose 07/03/2016. Status post 4 cycles. 3) Systemic chemotherapy with carboplatin for AUC of 5, Alimta 500 mg/M2 and Keytruda 200 mg IV every 3 weeks.  Status post 47 cycles.  Starting from cycle #5 the patient will be treated with maintenance Alimta and Ketruda (pembrolizumab) every 3 weeks.  This treatment was discontinued secondary to disease progression.  CURRENT THERAPY: Lumakras (Sotorasib) 960 mg p.o. daily started October 29, 2020.  INTERVAL HISTORY: William Kales [name] 65 y.o. male returns to the clinic today for follow-up visit accompanied by his wife.  The patient is feeling fine today with no concerning complaints except for mild fatigue and weakness in the lower extremities.  He is deconditioned since quit working several months ago.  He denied having any current chest pain, shortness of breath, cough or hemoptysis.  He denied having any fever or chills.  He has no nausea, vomiting,  diarrhea or constipation.  He has no headache or visual changes.  He denied having any significant weight loss or night sweats.  He started his treatment with Lumakras (Sotorasib) on October 29, 2020 and has been tolerating it fairly well with no significant rash or diarrhea.  He is here today for evaluation and repeat blood work.  MEDICAL HISTORY: Past Medical History:  Diagnosis Date   AAA (abdominal aortic aneurysm) ([place])    AAA (abdominal aortic aneurysm) without rupture ([place]) 02/04/2014   Cancer ([place])    lung   Encounter for antineoplastic chemotherapy 06/06/2016   History of hepatitis B    HNP (herniated nucleus pulposus), lumbar    L4 with radiculopathy   Hypertension    Lung cancer ([place]) 05/18/2016   Malignant neoplasm of lower lobe of left lung ([place]) 04/05/2016   Mass of left lung    Mitral insufficiency 04/06/2016   This patient will eventually need MV repair if his prognosis is good from oncology standpoint. However, his lung cancer therapy  is the priority right now. His cardiac condition won't preclude possible lung surgery.  Once his lung cancer is under control he will need a TEE to further evaluate his mitral valve anatomy and MR severity, and also have ischemic workup as tere is evidence on calcificati   Renal insufficiency 10/09/2016   S/P partial lobectomy of lung 05/11/2016   Varicose veins of legs     ALLERGIES:  is allergic to tramadol.  MEDICATIONS:  Current Outpatient Medications  Medication Sig Dispense Refill   carvedilol (COREG) 6.25 MG tablet Take 1 tablet (6.25 mg total)  by mouth 2 (two) times daily. 696 tablet 3   folic acid (FOLVITE) 1 MG tablet Take 1 tablet (1 mg total) by mouth daily. 90 tablet 1   lisinopril (ZESTRIL) 2.5 MG tablet TAKE 1 TABLET BY MOUTH EVERY DAY 90 tablet 3   sotorasib (LUMAKRAS) 120 MG TABS Take 8 tablets (960 mg) by mouth daily. 240 tablet 2   No current facility-administered medications for this visit.    SURGICAL HISTORY:  Past  Surgical History:  Procedure Laterality Date   ABDOMINAL AORTIC ENDOVASCULAR STENT GRAFT N/A 03/12/2016   Procedure: ABDOMINAL AORTIC ENDOVASCULAR STENT GRAFT;  Surgeon: Conrad Hahira, MD;  Location: [facility];  Service: Vascular;  Laterality: N/A;   COLONOSCOPY     INGUINAL HERNIA REPAIR Left 1975   Left inguinal hernia   INGUINAL HERNIA REPAIR Right    IR IMAGING GUIDED PORT INSERTION  09/04/2019   LOBECTOMY Left 05/11/2016   Procedure: LEFT LOWER LOBE LOBECTOMY AND LEFT UPPER LOBE  RESECTION AND PLACEMENT OF ON-Q;  Surgeon: [name], MD;  Location: [facility];  Service: Thoracic;  Laterality: Left;   LUMBAR LAMINECTOMY  October 22, 2012   LYMPH NODE DISSECTION Left 05/11/2016   Procedure: LYMPH NODE DISSECTION;  Surgeon: [name], MD;  Location: [facility];  Service: Thoracic;  Laterality: Left;   RIGHT/LEFT HEART CATH AND CORONARY ANGIOGRAPHY N/A 03/10/2020   Procedure: RIGHT/LEFT HEART CATH AND CORONARY ANGIOGRAPHY;  Surgeon: Sherren Mocha, MD;  Location: [facility];  Service: Cardiovascular;  Laterality: N/A;   STAPLING OF BLEBS Left 05/11/2016   Procedure: STAPLING OF APICAL BLEB;  Surgeon: [name], MD;  Location: [facility];  Service: Thoracic;  Laterality: Left;   TEE WITHOUT CARDIOVERSION N/A 10/07/2019   Procedure: TRANSESOPHAGEAL ECHOCARDIOGRAM (TEE);  Surgeon: Dorothy Spark, MD;  Location[facility] END[facility];  Service: Cardiovascular;  Laterality: N/A;   VIDEO ASSISTED THORACOSCOPY Left 05/18/2016   Procedure: VIDEO ASSISTED THORACOSCOPY WITH REMOVAL OF LEFT APICAL BLEB;  Surgeon: [name], MD;  Location: [facility];  Service: Thoracic;  Laterality: Left;   VIDEO ASSISTED THORACOSCOPY (VATS)/WEDGE RESECTION Left 05/11/2016   Procedure: LEFT VIDEO ASSISTED THORACOSCOPY (VATS);  Surgeon: [name], MD;  Location: [facility];  Service: Thoracic;  Laterality: Left;   VIDEO BRONCHOSCOPY N/A 05/11/2016   Procedure: VIDEO BRONCHOSCOPY, LEFT LUNG;  Surgeon: [name], MD;  Location: [facility];  Service: Thoracic;  Laterality: N/A;   VIDEO BRONCHOSCOPY N/A 05/18/2016   Procedure: VIDEO BRONCHOSCOPY WITH BRONCHIAL WASHING;  Surgeon: [name], MD;  Location: [facility];  Service: Thoracic;  Laterality: N/A;    REVIEW OF SYSTEMS:  A comprehensive review of systems was negative except for: Constitutional: positive for fatigue   PHYSICAL EXAMINATION: General appearance: alert, cooperative, fatigued, and no distress Head: Normocephalic, without obvious abnormality, atraumatic Neck: no adenopathy, no JVD, supple, symmetrical, trachea midline, and thyroid not enlarged, symmetric, no tenderness/mass/nodules Lymph nodes: Cervical, supraclavicular, and axillary nodes normal. Resp: clear to auscultation bilaterally Back: symmetric, no curvature. ROM normal. No CVA tenderness. Cardio: regular rate and rhythm, S1, S2 normal, no murmur, click, rub or gallop GI: soft, non-tender; bowel sounds normal; no masses,  no organomegaly Extremities: extremities normal, atraumatic, no cyanosis or edema   ECOG PERFORMANCE STATUS: 1 - Symptomatic but completely ambulatory  Blood pressure 109/71, pulse 89, temperature 97.6 F (36.4 C), temperature source Tympanic, resp. rate 18, height $RemoveBe'6\' 3"'GtxIlKVLi$  (1.905 m), weight 143 lb 9.6 oz (65.1 kg),  SpO2 99 %.  LABORATORY DATA: Lab Results  Component Value Date   WBC 8.5 11/14/2020   HGB 15.0 11/14/2020   HCT 46.0 11/14/2020   MCV 86.5 11/14/2020   PLT 251 11/14/2020      Chemistry      Component Value Date/Time   NA 132 (L) 10/25/2020 1431   NA 132 (L) 10/05/2019 0830   NA 138 01/11/2017 1343   K 4.6 10/25/2020 1431   K 4.7 01/11/2017 1343   CL 106 10/25/2020 1431   CO2 21 (L) 10/25/2020 1431   CO2 23 01/11/2017 1343   BUN 40 (H) 10/25/2020 1431   BUN 14 10/05/2019 0830   BUN 20.2 01/11/2017 1343   CREATININE 2.33 (H) 10/25/2020 1431   CREATININE 1.76 (H) 09/28/2020 0906   CREATININE 1.6 (H) 01/11/2017 1343       Component Value Date/Time   CALCIUM 9.8 10/25/2020 1431   CALCIUM 9.3 01/11/2017 1343   ALKPHOS 72 10/25/2020 1431   ALKPHOS 89 01/11/2017 1343   AST 18 10/25/2020 1431   AST 16 09/28/2020 0906   AST 41 (H) 01/11/2017 1343   ALT 15 10/25/2020 1431   ALT 8 09/28/2020 0906   ALT 27 01/11/2017 1343   BILITOT 0.5 10/25/2020 1431   BILITOT 0.5 09/28/2020 0906   BILITOT 0.49 01/11/2017 1343       RADIOGRAPHIC STUDIES: CT Abdomen Pelvis Wo Contrast  Result Date: 10/18/2020 CLINICAL DATA:  Primary Cancer Type: Lung Imaging Indication: Assess response to therapy Interval therapy since last imaging? Yes Initial Cancer Diagnosis Date: 03/29/2016; Established by: Biopsy-proven Detailed Pathology: Stage Ib non-small cell lung cancer, adenocarcinoma. Primary Tumor location:  Left lower lobe. Recurrence? Yes; Date(s) of recurrence: 12/02/2017; Established by: Biopsy-proven; metastasis to T4. Surgeries: Left lower lobectomy and left upper lobe wedge resection, with stapling of apical bleb, 05/11/2016. AAA stent graft 2017. Chemotherapy: Yes; Ongoing? No; Most recent administration: 06/30/2020 Immunotherapy?  Yes; Type: Keytruda; Ongoing? Yes Radiation therapy? No EXAM: CT CHEST, ABDOMEN AND PELVIS WITHOUT CONTRAST TECHNIQUE: Multidetector CT imaging of the chest, abdomen and pelvis was performed following the standard protocol without IV contrast. COMPARISON:  Most recent CT chest, abdomen and pelvis 07/19/2020. 11/12/2017 PET-CT. FINDINGS: CT CHEST FINDINGS Cardiovascular: Right chest port catheter. Aortic atherosclerosis. 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 Unchanged enlargement of the distal aortic arch measuring up to 4.2 x 4.0 cm (series 6, image 125). Normal heart size. No pericardial effusion. Mediastinum/Nodes: No enlarged mediastinal, hilar, or axillary lymph nodes. Thyroid gland, trachea, and esophagus demonstrate no significant findings.  Lungs/Pleura: Moderate centrilobular and paraseptal emphysema. Diffuse bilateral bronchial wall thickening. Redemonstrated postoperative findings of left lower lobectomy and wedge resection of the posterior left upper lobe. There are multiple new bilateral nodular opacities, for example in the anterior right upper lobe measuring 0.5 cm (series 4, image 70). There are some slightly enlarged pulmonary nodules, for example a 1.0 x 0.5 cm nodule of the peripheral left upper lobe, previously no greater than 0.7 x 0.3 cm (series 4, image 69) and a 0.8 x 0.6 nodule of the anterior right upper lobe, previously 0.7 x 0.5 cm (series 4, image 80). There is new, relatively diffuse interstitial opacity and very fine nodularity, generally in a perilymphatic distribution (series 4, image [phone]). Unchanged small, chronic, loculated left pleural effusion. Pleural thickening and calcification of the dependent right pleura (series 4, image 93). Musculoskeletal: No chest wall mass. Unchanged, mildly  expansile sclerotic lesion of the spinous process of T4 (series 4, image 105). CT ABDOMEN PELVIS FINDINGS Hepatobiliary: No solid liver abnormality is seen. No gallstones, gallbladder wall thickening, or biliary dilatation. Pancreas: Unremarkable. No pancreatic ductal dilatation or surrounding inflammatory changes. Spleen: Normal in size without significant abnormality. Adrenals/Urinary Tract: Adrenal glands are unremarkable. Kidneys are normal, without renal calculi, solid lesion, or hydronephrosis. Bladder is unremarkable. Stomach/Bowel: Stomach is within normal limits. Appendix appears normal. No evidence of bowel wall thickening, distention, or inflammatory changes. Vascular/Lymphatic: Aortic atherosclerosis. Status post aortobiiliac stent endograft repair. No enlarged abdominal or pelvic lymph nodes. Reproductive: No mass or other abnormality. Other: No abdominal wall hernia or abnormality. No abdominopelvic ascites.  Musculoskeletal: No acute or significant osseous findings. IMPRESSION: 1. Redemonstrated postoperative findings of left lower lobectomy and wedge resection of the posterior left upper lobe. 2. There are multiple new and enlarged bilateral nodular opacities. There is new, relatively diffuse interstitial opacity and very fine nodularity, generally in a perilymphatic distribution. Findings are highly suspicious for pulmonary metastatic disease, including lymphangitic involvement, however nonspecific infection or inflammation is a differential consideration. 3. Unchanged small, chronic, loculated left pleural effusion. 4. No noncontrast evidence of metastatic disease in the abdomen or pelvis. 5. Unchanged mildly expansile sclerotic lesion of the spinous process of T4. No other findings to suggest osseous metastatic disease. 6. Emphysema and diffuse bilateral bronchial wall thickening, consistent with nonspecific infectious or inflammatory bronchitis. 7. 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 Unchanged enlargement of the distal aortic arch, measuring up to 4.2 x 4.0 cm. Recommend semi-annual imaging followup by CTA or MRA, if not otherwise imaged, and referral to cardiothoracic surgery if not already obtained. This recommendation follows 2010 ACCF/AHA/AATS/ACR/ASA/SCA/SCAI/SIR/STS/SVM Guidelines for the Diagnosis and Management of Patients With Thoracic Aortic Disease. Circulation. 2010; 121: B262-M35. Aortic aneurysm NOS (ICD10-I71.9) 8. Status post aortobiiliac stent endograft repair. Aortic Atherosclerosis (ICD10-I70.0) and Emphysema (ICD10-J43.9). Electronically Signed   By: Eddie Candle M.D.   On: 10/18/2020 10:28   CT Chest Wo Contrast  Result Date: 10/18/2020 CLINICAL DATA:  Primary Cancer Type: Lung Imaging Indication: Assess response to therapy Interval therapy since last imaging? Yes Initial Cancer Diagnosis Date: 03/29/2016; Established by:  Biopsy-proven Detailed Pathology: Stage Ib non-small cell lung cancer, adenocarcinoma. Primary Tumor location:  Left lower lobe. Recurrence? Yes; Date(s) of recurrence: 12/02/2017; Established by: Biopsy-proven; metastasis to T4. Surgeries: Left lower lobectomy and left upper lobe wedge resection, with stapling of apical bleb, 05/11/2016. AAA stent graft 2017. Chemotherapy: Yes; Ongoing? No; Most recent administration: 06/30/2020 Immunotherapy?  Yes; Type: Keytruda; Ongoing? Yes Radiation therapy? No EXAM: CT CHEST, ABDOMEN AND PELVIS WITHOUT CONTRAST TECHNIQUE: Multidetector CT imaging of the chest, abdomen and pelvis was performed following the standard protocol without IV contrast. COMPARISON:  Most recent CT chest, abdomen and pelvis 07/19/2020. 11/12/2017 PET-CT. FINDINGS: CT CHEST FINDINGS Cardiovascular: Right chest port catheter. Aortic atherosclerosis. 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 Unchanged enlargement of the distal aortic arch measuring up to 4.2 x 4.0 cm (series 6, image 125). Normal heart size. No pericardial effusion. Mediastinum/Nodes: No enlarged mediastinal, hilar, or axillary lymph nodes. Thyroid gland, trachea, and esophagus demonstrate no significant findings. Lungs/Pleura: Moderate centrilobular and paraseptal emphysema. Diffuse bilateral bronchial wall thickening. Redemonstrated postoperative findings of left lower lobectomy and wedge resection of the posterior left upper lobe. There are multiple new bilateral nodular opacities, for example in the anterior right  upper lobe measuring 0.5 cm (series 4, image 70). There are some slightly enlarged pulmonary nodules, for example a 1.0 x 0.5 cm nodule of the peripheral left upper lobe, previously no greater than 0.7 x 0.3 cm (series 4, image 69) and a 0.8 x 0.6 nodule of the anterior right upper lobe, previously 0.7 x 0.5 cm (series 4, image 80). There is new, relatively diffuse  interstitial opacity and very fine nodularity, generally in a perilymphatic distribution (series 4, image [phone] 63). Unchanged small, chronic, loculated left pleural effusion. Pleural thickening and calcification of the dependent right pleura (series 4, image 93). Musculoskeletal: No chest wall mass. Unchanged, mildly expansile sclerotic lesion of the spinous process of T4 (series 4, image 105). CT ABDOMEN PELVIS FINDINGS Hepatobiliary: No solid liver abnormality is seen. No gallstones, gallbladder wall thickening, or biliary dilatation. Pancreas: Unremarkable. No pancreatic ductal dilatation or surrounding inflammatory changes. Spleen: Normal in size without significant abnormality. Adrenals/Urinary Tract: Adrenal glands are unremarkable. Kidneys are normal, without renal calculi, solid lesion, or hydronephrosis. Bladder is unremarkable. Stomach/Bowel: Stomach is within normal limits. Appendix appears normal. No evidence of bowel wall thickening, distention, or inflammatory changes. Vascular/Lymphatic: Aortic atherosclerosis. Status post aortobiiliac stent endograft repair. No enlarged abdominal or pelvic lymph nodes. Reproductive: No mass or other abnormality. Other: No abdominal wall hernia or abnormality. No abdominopelvic ascites. Musculoskeletal: No acute or significant osseous findings. IMPRESSION: 1. Redemonstrated postoperative findings of left lower lobectomy and wedge resection of the posterior left upper lobe. 2. There are multiple new and enlarged bilateral nodular opacities. There is new, relatively diffuse interstitial opacity and very fine nodularity, generally in a perilymphatic distribution. Findings are highly suspicious for pulmonary metastatic disease, including lymphangitic involvement, however nonspecific infection or inflammation is a differential consideration. 3. Unchanged small, chronic, loculated left pleural effusion. 4. No noncontrast evidence of metastatic disease in the abdomen or  pelvis. 5. Unchanged mildly expansile sclerotic lesion of the spinous process of T4. No other findings to suggest osseous metastatic disease. 6. Emphysema and diffuse bilateral bronchial wall thickening, consistent with nonspecific infectious or inflammatory bronchitis. 7. 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 Unchanged enlargement of the distal aortic arch, measuring up to 4.2 x 4.0 cm. Recommend semi-annual imaging followup by CTA or MRA, if not otherwise imaged, and referral to cardiothoracic surgery if not already obtained. This recommendation follows 2010 ACCF/AHA/AATS/ACR/ASA/SCA/SCAI/SIR/STS/SVM Guidelines for the Diagnosis and Management of Patients With Thoracic Aortic Disease. Circulation. 2010; 121: N235-T73. Aortic aneurysm NOS (ICD10-I71.9) 8. Status post aortobiiliac stent endograft repair. Aortic Atherosclerosis (ICD10-I70.0) and Emphysema (ICD10-J43.9). Electronically Signed   By: Eddie Candle M.D.   On: 10/18/2020 10:28    ASSESSMENT AND PLAN:  This is a very pleasant 65 years old white male with a stage IB non-small cell lung cancer, adenocarcinoma status post wedge resection of the left upper lobe followed by 4 cycles of adjuvant systemic chemotherapy with cisplatin and Alimta and he tolerated his treatment well except for fatigue. The patient has been on observation since June 2018.   The recent imaging studies including CT scan of the chest as well as a PET scan showed evidence for disease recurrence with metastatic disease presented with bilateral pulmonary nodules as well as destructive bone lesions at T4 vertebral body, biopsy proven to be metastatic adenocarcinoma. Molecular studies by foundation 1 showed KRAS G12C mutation.  PDL 1 expression was negative. The patient was treated with carboplatin, Alimta and Ketruda (pembrolizumab) status  post 47 cycles.  Starting from cycle #5 he is on maintenance treatment with Alimta and  Keytruda every 3 weeks. He has been on treatment with single agent Keytruda recently because of the renal insufficiency.  The patient has been tolerating this treatment well with no concerning complaints.  This treatment was discontinued secondary to disease progression. He started second line treatment with Lumakras (Sotorasib) 960 mg p.o. daily on October 29, 2020 and has been tolerating this treatment well with no significant adverse effects. Blood work today is unremarkable for any concerning abnormalities. I recommended for him to continue his treatment with Lumakras (Sotorasib) with the same dose. I also encouraged the patient to increase his oral hydration. I will see him back for follow-up visit in 2 weeks for evaluation and repeat blood work. He was advised to call immediately if he has any concerning symptoms in the interval. The patient voices understanding of current disease status and treatment options and is in agreement with the current care plan. All questions were answered. The patient knows to call the clinic with any problems, questions or concerns. We can certainly see the patient much sooner if necessary. Disclaimer: This note was dictated with voice recognition software. Similar sounding words can inadvertently be transcribed and may be missed upon review. Eilleen Kempf, MD 11/14/20

## 2020-11-17 ENCOUNTER — Telehealth: Payer: Self-pay | Admitting: Internal Medicine

## 2020-11-17 NOTE — Telephone Encounter (Signed)
Scheduled per los. Called, not able to leave msg

## 2020-11-22 ENCOUNTER — Encounter: Payer: Self-pay | Admitting: Internal Medicine

## 2020-11-28 ENCOUNTER — Telehealth: Payer: Self-pay | Admitting: Internal Medicine

## 2020-11-28 ENCOUNTER — Encounter: Payer: Self-pay | Admitting: Internal Medicine

## 2020-11-28 ENCOUNTER — Other Ambulatory Visit: Payer: Self-pay

## 2020-11-28 ENCOUNTER — Other Ambulatory Visit: Payer: Self-pay | Admitting: Medical Oncology

## 2020-11-28 ENCOUNTER — Inpatient Hospital Stay: Payer: Federal, State, Local not specified - PPO

## 2020-11-28 ENCOUNTER — Inpatient Hospital Stay: Payer: Federal, State, Local not specified - PPO | Attending: Internal Medicine | Admitting: Internal Medicine

## 2020-11-28 VITALS — BP 99/69 | HR 98 | Temp 97.7°F | Resp 18 | Ht 75.0 in | Wt 141.0 lb

## 2020-11-28 DIAGNOSIS — N289 Disorder of kidney and ureter, unspecified: Secondary | ICD-10-CM | POA: Insufficient documentation

## 2020-11-28 DIAGNOSIS — C3412 Malignant neoplasm of upper lobe, left bronchus or lung: Secondary | ICD-10-CM | POA: Diagnosis not present

## 2020-11-28 DIAGNOSIS — C7951 Secondary malignant neoplasm of bone: Secondary | ICD-10-CM | POA: Insufficient documentation

## 2020-11-28 DIAGNOSIS — Z5112 Encounter for antineoplastic immunotherapy: Secondary | ICD-10-CM

## 2020-11-28 DIAGNOSIS — R748 Abnormal levels of other serum enzymes: Secondary | ICD-10-CM

## 2020-11-28 DIAGNOSIS — C3432 Malignant neoplasm of lower lobe, left bronchus or lung: Secondary | ICD-10-CM

## 2020-11-28 DIAGNOSIS — R197 Diarrhea, unspecified: Secondary | ICD-10-CM | POA: Insufficient documentation

## 2020-11-28 DIAGNOSIS — C3492 Malignant neoplasm of unspecified part of left bronchus or lung: Secondary | ICD-10-CM

## 2020-11-28 DIAGNOSIS — C3490 Malignant neoplasm of unspecified part of unspecified bronchus or lung: Secondary | ICD-10-CM

## 2020-11-28 DIAGNOSIS — Z79899 Other long term (current) drug therapy: Secondary | ICD-10-CM | POA: Diagnosis not present

## 2020-11-28 LAB — CBC WITH DIFFERENTIAL (CANCER CENTER ONLY)
Abs Immature Granulocytes: 0.03 10*3/uL (ref 0.00–0.07)
Basophils Absolute: 0 10*3/uL (ref 0.0–0.1)
Basophils Relative: 1 %
Eosinophils Absolute: 0.5 10*3/uL (ref 0.0–0.5)
Eosinophils Relative: 7 %
HCT: 50 % (ref 39.0–52.0)
Hemoglobin: 16.3 g/dL (ref 13.0–17.0)
Immature Granulocytes: 0 %
Lymphocytes Relative: 14 %
Lymphs Abs: 1 10*3/uL (ref 0.7–4.0)
MCH: 28.4 pg (ref 26.0–34.0)
MCHC: 32.6 g/dL (ref 30.0–36.0)
MCV: 87.3 fL (ref 80.0–100.0)
Monocytes Absolute: 1 10*3/uL (ref 0.1–1.0)
Monocytes Relative: 14 %
Neutro Abs: 4.7 10*3/uL (ref 1.7–7.7)
Neutrophils Relative %: 64 %
Platelet Count: 249 10*3/uL (ref 150–400)
RBC: 5.73 MIL/uL (ref 4.22–5.81)
RDW: 14 % (ref 11.5–15.5)
WBC Count: 7.3 10*3/uL (ref 4.0–10.5)
nRBC: 0 % (ref 0.0–0.2)

## 2020-11-28 LAB — CMP (CANCER CENTER ONLY)
ALT: 58 U/L — ABNORMAL HIGH (ref 0–44)
AST: 59 U/L — ABNORMAL HIGH (ref 15–41)
Albumin: 3.3 g/dL — ABNORMAL LOW (ref 3.5–5.0)
Alkaline Phosphatase: 172 U/L — ABNORMAL HIGH (ref 38–126)
Anion gap: 9 (ref 5–15)
BUN: 23 mg/dL (ref 8–23)
CO2: 17 mmol/L — ABNORMAL LOW (ref 22–32)
Calcium: 10.5 mg/dL — ABNORMAL HIGH (ref 8.9–10.3)
Chloride: 111 mmol/L (ref 98–111)
Creatinine: 2.26 mg/dL — ABNORMAL HIGH (ref 0.61–1.24)
GFR, Estimated: 31 mL/min — ABNORMAL LOW (ref >60)
Glucose, Bld: 103 mg/dL — ABNORMAL HIGH (ref 70–99)
Potassium: 4 mmol/L (ref 3.5–5.1)
Sodium: 137 mmol/L (ref 135–145)
Total Bilirubin: 0.4 mg/dL (ref 0.3–1.2)
Total Protein: 7.8 g/dL (ref 6.5–8.1)

## 2020-11-28 NOTE — Telephone Encounter (Signed)
Scheduled appt per 8/8 sch msg. Called pt using both numbers provided. No answer and no vm available. Will try to contact pt again tomorrow.

## 2020-11-28 NOTE — Progress Notes (Signed)
[facility] [phone]   Fax:(336) [phone]  OFFICE PROGRESS NOTE  [name], MD [address] [place] [address]  DIAGNOSIS: Metastatic non-small cell lung cancer, adenocarcinoma initially diagnosed as stage IB (T2a, N0, M0) non-small cell lung cancer, adenocarcinoma diagnosed in December 2017.  The patient has evidence for disease recurrence in July 2019.  Biomarker Findings Tumor Mutational Burden - TMB-Intermediate (16 Muts/Mb) Microsatellite status - MS-Stable Genomic Findings For a complete list of the genes assayed, please refer to the Appendix. KIT amplification KRAS D22G SMAD4 splice site 2542-7C>W CB76 I232F 7 Disease relevant genes with no reportable alterations: EGFR, ALK, BRAF, MET, RET, ERBB2, ROS1   PDL 1 expression is 0%  PRIOR THERAPY:  1) status post left lower lobectomy as well as wedge resection of the left upper lobe on 05/11/2016. 2) Adjuvant systemic chemotherapy with cisplatin 75 MG/M2 and Alimta 500 MG/M2 every 3 weeks. First dose 07/03/2016. Status post 4 cycles. 3) Systemic chemotherapy with carboplatin for AUC of 5, Alimta 500 mg/M2 and Keytruda 200 mg IV every 3 weeks.  Status post 47 cycles.  Starting from cycle #5 the patient will be treated with maintenance Alimta and Ketruda (pembrolizumab) every 3 weeks.  This treatment was discontinued secondary to disease progression.  CURRENT THERAPY: Lumakras (Sotorasib) 960 mg p.o. daily started October 29, 2020.  Status post 1 months of treatment.  INTERVAL HISTORY: William Kales [name] 65 y.o. male returns to the clinic today for follow-up visit accompanied by his wife.  The patient is feeling fine today with no concerning complaints except for 1 episode of diarrhea daily.  He takes Imodium only as needed.  He denied having any chest pain, shortness of breath, cough or hemoptysis.  He denied having any fever or chills.  He has no nausea, vomiting, abdominal pain or  constipation.  He has no headache or visual changes.  He continues to tolerate his treatment with Lumakras (Sotorasib) fairly well.  The patient is here today for evaluation and repeat blood work.  MEDICAL HISTORY: Past Medical History:  Diagnosis Date   AAA (abdominal aortic aneurysm) ([place])    AAA (abdominal aortic aneurysm) without rupture ([place]) 02/04/2014   Cancer ([place])    lung   Encounter for antineoplastic chemotherapy 06/06/2016   History of hepatitis B    HNP (herniated nucleus pulposus), lumbar    L4 with radiculopathy   Hypertension    Lung cancer ([place]) 05/18/2016   Malignant neoplasm of lower lobe of left lung ([place]) 04/05/2016   Mass of left lung    Mitral insufficiency 04/06/2016   This patient will eventually need MV repair if his prognosis is good from oncology standpoint. However, his lung cancer therapy  is the priority right now. His cardiac condition won't preclude possible lung surgery.  Once his lung cancer is under control he will need a TEE to further evaluate his mitral valve anatomy and MR severity, and also have ischemic workup as tere is evidence on calcificati   Renal insufficiency 10/09/2016   S/P partial lobectomy of lung 05/11/2016   Varicose veins of legs     ALLERGIES:  is allergic to tramadol.  MEDICATIONS:  Current Outpatient Medications  Medication Sig Dispense Refill   carvedilol (COREG) 6.25 MG tablet Take 1 tablet (6.25 mg total) by mouth 2 (two) times daily. 283 tablet 3   folic acid (FOLVITE) 1 MG tablet Take 1 tablet (1 mg  total) by mouth daily. 90 tablet 1   lisinopril (ZESTRIL) 2.5 MG tablet TAKE 1 TABLET BY MOUTH EVERY DAY 90 tablet 3   sotorasib (LUMAKRAS) 120 MG TABS Take 8 tablets (960 mg) by mouth daily. 240 tablet 2   No current facility-administered medications for this visit.    SURGICAL HISTORY:  Past Surgical History:  Procedure Laterality Date   ABDOMINAL AORTIC ENDOVASCULAR STENT GRAFT N/A 03/12/2016   Procedure: ABDOMINAL  AORTIC ENDOVASCULAR STENT GRAFT;  Surgeon: Conrad Shumway, MD;  Location: [facility];  Service: Vascular;  Laterality: N/A;   COLONOSCOPY     INGUINAL HERNIA REPAIR Left 1975   Left inguinal hernia   INGUINAL HERNIA REPAIR Right    IR IMAGING GUIDED PORT INSERTION  09/04/2019   LOBECTOMY Left 05/11/2016   Procedure: LEFT LOWER LOBE LOBECTOMY AND LEFT UPPER LOBE  RESECTION AND PLACEMENT OF ON-Q;  Surgeon: [name], MD;  Location: [facility];  Service: Thoracic;  Laterality: Left;   LUMBAR LAMINECTOMY  October 22, 2012   LYMPH NODE DISSECTION Left 05/11/2016   Procedure: LYMPH NODE DISSECTION;  Surgeon: [name], MD;  Location: [facility];  Service: Thoracic;  Laterality: Left;   RIGHT/LEFT HEART CATH AND CORONARY ANGIOGRAPHY N/A 03/10/2020   Procedure: RIGHT/LEFT HEART CATH AND CORONARY ANGIOGRAPHY;  Surgeon: Sherren Mocha, MD;  Location: [facility];  Service: Cardiovascular;  Laterality: N/A;   STAPLING OF BLEBS Left 05/11/2016   Procedure: STAPLING OF APICAL BLEB;  Surgeon: [name], MD;  Location: [facility];  Service: Thoracic;  Laterality: Left;   TEE WITHOUT CARDIOVERSION N/A 10/07/2019   Procedure: TRANSESOPHAGEAL ECHOCARDIOGRAM (TEE);  Surgeon: Dorothy Spark, MD;  Location: [facility];  Service: Cardiovascular;  Laterality: N/A;   VIDEO ASSISTED THORACOSCOPY Left 05/18/2016   Procedure: VIDEO ASSISTED THORACOSCOPY WITH REMOVAL OF LEFT APICAL BLEB;  Surgeon: [name], MD;  Location: [facility];  Service: Thoracic;  Laterality: Left;   VIDEO ASSISTED THORACOSCOPY (VATS)/WEDGE RESECTION Left 05/11/2016   Procedure: LEFT VIDEO ASSISTED THORACOSCOPY (VATS);  Surgeon: [name], MD;  Location: [facility];  Service: Thoracic;  Laterality: Left;   VIDEO BRONCHOSCOPY N/A 05/11/2016   Procedure: VIDEO BRONCHOSCOPY, LEFT LUNG;  Surgeon: [name], MD;  Location: [facility];  Service: Thoracic;  Laterality: N/A;   VIDEO BRONCHOSCOPY N/A 05/18/2016   Procedure: VIDEO BRONCHOSCOPY  WITH BRONCHIAL WASHING;  Surgeon: [name], MD;  Location: [facility];  Service: Thoracic;  Laterality: N/A;    REVIEW OF SYSTEMS:  A comprehensive review of systems was negative except for: Constitutional: positive for fatigue Gastrointestinal: positive for diarrhea   PHYSICAL EXAMINATION: General appearance: alert, cooperative, fatigued, and no distress Head: Normocephalic, without obvious abnormality, atraumatic Neck: no adenopathy, no JVD, supple, symmetrical, trachea midline, and thyroid not enlarged, symmetric, no tenderness/mass/nodules Lymph nodes: Cervical, supraclavicular, and axillary nodes normal. Resp: clear to auscultation bilaterally Back: symmetric, no curvature. ROM normal. No CVA tenderness. Cardio: regular rate and rhythm, S1, S2 normal, no murmur, click, rub or gallop GI: soft, non-tender; bowel sounds normal; no masses,  no organomegaly Extremities: extremities normal, atraumatic, no cyanosis or edema   ECOG PERFORMANCE STATUS: 1 - Symptomatic but completely ambulatory  Blood pressure 99/69, pulse 98, temperature 97.7 F (36.5 C), temperature source Tympanic, resp. rate 18, height _0  (1.905 m), weight 141 lb (64 kg), SpO2 94 %.  LABORATORY DATA: Lab Results  Component Value Date   WBC 7.3 11/28/2020   HGB  16.3 11/28/2020   HCT 50.0 11/28/2020   MCV 87.3 11/28/2020   PLT 249 11/28/2020      Chemistry      Component Value Date/Time   NA 132 (L) 11/14/2020 0908   NA 132 (L) 10/05/2019 0830   NA 138 01/11/2017 1343   K 4.8 11/14/2020 0908   K 4.7 01/11/2017 1343   CL 100 11/14/2020 0908   CO2 25 11/14/2020 0908   CO2 23 01/11/2017 1343   BUN 28 (H) 11/14/2020 0908   BUN 14 10/05/2019 0830   BUN 20.2 01/11/2017 1343   CREATININE 2.03 (H) 11/14/2020 0908   CREATININE 1.6 (H) 01/11/2017 1343      Component Value Date/Time   CALCIUM 10.6 (H) 11/14/2020 0908   CALCIUM 9.3 01/11/2017 1343   ALKPHOS 73 11/14/2020 0908   ALKPHOS 89 01/11/2017 1343    AST 18 11/14/2020 0908   AST 41 (H) 01/11/2017 1343   ALT 11 11/14/2020 0908   ALT 27 01/11/2017 1343   BILITOT 0.5 11/14/2020 0908   BILITOT 0.49 01/11/2017 1343       RADIOGRAPHIC STUDIES: No results found.  ASSESSMENT AND PLAN:  This is a very pleasant 65 years old white male with a stage IB non-small cell lung cancer, adenocarcinoma status post wedge resection of the left upper lobe followed by 4 cycles of adjuvant systemic chemotherapy with cisplatin and Alimta and he tolerated his treatment well except for fatigue. The patient has been on observation since June 2018.   The recent imaging studies including CT scan of the chest as well as a PET scan showed evidence for disease recurrence with metastatic disease presented with bilateral pulmonary nodules as well as destructive bone lesions at T4 vertebral body, biopsy proven to be metastatic adenocarcinoma. Molecular studies by foundation 1 showed KRAS G12C mutation.  PDL 1 expression was negative. The patient was treated with carboplatin, Alimta and Ketruda (pembrolizumab) status post 47 cycles.  Starting from cycle #5 he is on maintenance treatment with Alimta and Keytruda every 3 weeks. He has been on treatment with single agent Keytruda recently because of the renal insufficiency.  The patient has been tolerating this treatment well with no concerning complaints.  This treatment was discontinued secondary to disease progression. He started second line treatment with Lumakras (Sotorasib) 960 mg p.o. daily on October 29, 2020. The patient has been tolerating this treatment well with no concerning adverse effect except for 1 episode of diarrhea on daily basis and he does not take Imodium except when needed. CBC today is unremarkable but comprehensive metabolic panel is still pending. I recommended for the patient to continue his current treatment with Lumakras (Sotorasib) with the same dose for now as long as he does not have any  abnormalities on the comprehensive metabolic panel. I will see him back for follow-up visit in 1 months for evaluation with repeat CT scan of the chest, abdomen and pelvis for restaging of his disease. He was advised to call immediately if he has any other concerning symptoms in the interval. The patient voices understanding of current disease status and treatment options and is in agreement with the current care plan. All questions were answered. The patient knows to call the clinic with any problems, questions or concerns. We can certainly see the patient much sooner if necessary. Disclaimer: This note was dictated with voice recognition software. Similar sounding words can inadvertently be transcribed and may be missed upon review. Eilleen Kempf, MD 11/28/20

## 2020-11-29 ENCOUNTER — Telehealth: Payer: Self-pay | Admitting: Internal Medicine

## 2020-11-29 NOTE — Telephone Encounter (Signed)
Scheduled appt per 8/8 sch msg. Pt aware.

## 2020-11-30 ENCOUNTER — Telehealth: Payer: Self-pay | Admitting: Internal Medicine

## 2020-11-30 NOTE — Telephone Encounter (Signed)
Scheduled per los. Called, not able to leave msg. Mailed printout  

## 2020-12-01 ENCOUNTER — Ambulatory Visit: Payer: Federal, State, Local not specified - PPO | Admitting: Internal Medicine

## 2020-12-01 ENCOUNTER — Ambulatory Visit: Payer: Federal, State, Local not specified - PPO

## 2020-12-01 ENCOUNTER — Other Ambulatory Visit: Payer: Federal, State, Local not specified - PPO

## 2020-12-05 ENCOUNTER — Other Ambulatory Visit: Payer: Self-pay

## 2020-12-05 ENCOUNTER — Other Ambulatory Visit: Payer: Medicare Other

## 2020-12-05 ENCOUNTER — Inpatient Hospital Stay: Payer: Federal, State, Local not specified - PPO

## 2020-12-05 ENCOUNTER — Telehealth: Payer: Self-pay

## 2020-12-05 ENCOUNTER — Other Ambulatory Visit: Payer: Self-pay | Admitting: Internal Medicine

## 2020-12-05 ENCOUNTER — Telehealth: Payer: Self-pay | Admitting: Internal Medicine

## 2020-12-05 DIAGNOSIS — C3490 Malignant neoplasm of unspecified part of unspecified bronchus or lung: Secondary | ICD-10-CM

## 2020-12-05 DIAGNOSIS — N289 Disorder of kidney and ureter, unspecified: Secondary | ICD-10-CM | POA: Diagnosis not present

## 2020-12-05 DIAGNOSIS — Z95828 Presence of other vascular implants and grafts: Secondary | ICD-10-CM

## 2020-12-05 DIAGNOSIS — R197 Diarrhea, unspecified: Secondary | ICD-10-CM | POA: Diagnosis not present

## 2020-12-05 DIAGNOSIS — C3432 Malignant neoplasm of lower lobe, left bronchus or lung: Secondary | ICD-10-CM | POA: Diagnosis not present

## 2020-12-05 DIAGNOSIS — Z79899 Other long term (current) drug therapy: Secondary | ICD-10-CM | POA: Diagnosis not present

## 2020-12-05 DIAGNOSIS — R748 Abnormal levels of other serum enzymes: Secondary | ICD-10-CM

## 2020-12-05 DIAGNOSIS — C7951 Secondary malignant neoplasm of bone: Secondary | ICD-10-CM | POA: Diagnosis not present

## 2020-12-05 DIAGNOSIS — C3412 Malignant neoplasm of upper lobe, left bronchus or lung: Secondary | ICD-10-CM | POA: Diagnosis not present

## 2020-12-05 LAB — CMP (CANCER CENTER ONLY)
ALT: 343 U/L (ref 0–44)
AST: 300 U/L (ref 15–41)
Albumin: 2.9 g/dL — ABNORMAL LOW (ref 3.5–5.0)
Alkaline Phosphatase: 353 U/L — ABNORMAL HIGH (ref 38–126)
Anion gap: 11 (ref 5–15)
BUN: 32 mg/dL — ABNORMAL HIGH (ref 8–23)
CO2: 16 mmol/L — ABNORMAL LOW (ref 22–32)
Calcium: 9.6 mg/dL (ref 8.9–10.3)
Chloride: 111 mmol/L (ref 98–111)
Creatinine: 2.47 mg/dL — ABNORMAL HIGH (ref 0.61–1.24)
GFR, Estimated: 28 mL/min — ABNORMAL LOW (ref >60)
Glucose, Bld: 95 mg/dL (ref 70–99)
Potassium: 3.2 mmol/L — ABNORMAL LOW (ref 3.5–5.1)
Sodium: 138 mmol/L (ref 135–145)
Total Bilirubin: 1.5 mg/dL — ABNORMAL HIGH (ref 0.3–1.2)
Total Protein: 6.6 g/dL (ref 6.5–8.1)

## 2020-12-05 LAB — CBC WITH DIFFERENTIAL (CANCER CENTER ONLY)
Abs Immature Granulocytes: 0.05 10*3/uL (ref 0.00–0.07)
Basophils Absolute: 0.1 10*3/uL (ref 0.0–0.1)
Basophils Relative: 1 %
Eosinophils Absolute: 0.5 10*3/uL (ref 0.0–0.5)
Eosinophils Relative: 6 %
HCT: 41.5 % (ref 39.0–52.0)
Hemoglobin: 14.1 g/dL (ref 13.0–17.0)
Immature Granulocytes: 1 %
Lymphocytes Relative: 12 %
Lymphs Abs: 1.1 10*3/uL (ref 0.7–4.0)
MCH: 28.1 pg (ref 26.0–34.0)
MCHC: 34 g/dL (ref 30.0–36.0)
MCV: 82.8 fL (ref 80.0–100.0)
Monocytes Absolute: 1.2 10*3/uL — ABNORMAL HIGH (ref 0.1–1.0)
Monocytes Relative: 14 %
Neutro Abs: 5.8 10*3/uL (ref 1.7–7.7)
Neutrophils Relative %: 66 %
Platelet Count: 265 10*3/uL (ref 150–400)
RBC: 5.01 MIL/uL (ref 4.22–5.81)
RDW: 15.2 % (ref 11.5–15.5)
WBC Count: 8.7 10*3/uL (ref 4.0–10.5)
nRBC: 0 % (ref 0.0–0.2)

## 2020-12-05 LAB — TSH: TSH: 2.746 u[IU]/mL (ref 0.320–4.118)

## 2020-12-05 MED ORDER — PREDNISONE 10 MG PO TABS: ORAL_TABLET | ORAL | 0 refills | Status: DC

## 2020-12-05 MED ORDER — HEPARIN SOD (PORK) LOCK FLUSH 100 UNIT/ML IV SOLN: 500.0000 [IU] | Freq: Once | INTRAVENOUS | Status: DC

## 2020-12-05 MED ORDER — SODIUM CHLORIDE 0.9% FLUSH: 10.0000 mL | Freq: Once | INTRAVENOUS | Status: DC

## 2020-12-05 NOTE — Telephone Encounter (Signed)
Scheduled appts per 8/15 sch msg. Pt aware.  

## 2020-12-05 NOTE — Telephone Encounter (Signed)
CRITICAL VALUE STICKER  CRITICAL VALUE: AST = 300 and ALT = 343  RECEIVER (on-site recipient of call): Yetta Glassman, CMA  DATE & TIME NOTIFIED: 12/05/20 at 12:06pm  MESSENGER (representative from lab): Lelan Pons  MD NOTIFIED: Dr. Julien Nordmann  TIME OF NOTIFICATION: 12/05/20 at 12:10pm  RESPONSE: Notification given to Dr. Julien Nordmann for follow-up. He wants pt to d/s his Lumakras for now, he will send a rx for steroids and wants pt to have his lab rechecked in a week.

## 2020-12-05 NOTE — Telephone Encounter (Signed)
I have attempted to reach the pt on his cell and home numbers and there is no VM. I have left  detailed message for him at his wife's number.

## 2020-12-07 ENCOUNTER — Telehealth: Payer: Self-pay | Admitting: Medical Oncology

## 2020-12-07 NOTE — Telephone Encounter (Signed)
Pt notified of abnormal liver enzymes and instruction given  Per Dr. Julien Nordmann follow-up instructions: - Discontinue his [place] for now, - he sent  a rx for steroids  - lab rechecked in a week.-scheduled for 08/23. Pt voiced understanding.

## 2020-12-13 ENCOUNTER — Encounter: Payer: Self-pay | Admitting: *Deleted

## 2020-12-13 ENCOUNTER — Encounter: Payer: Self-pay | Admitting: Internal Medicine

## 2020-12-13 ENCOUNTER — Telehealth: Payer: Self-pay

## 2020-12-13 ENCOUNTER — Other Ambulatory Visit: Payer: Self-pay

## 2020-12-13 ENCOUNTER — Inpatient Hospital Stay (HOSPITAL_BASED_OUTPATIENT_CLINIC_OR_DEPARTMENT_OTHER): Payer: Federal, State, Local not specified - PPO | Admitting: Internal Medicine

## 2020-12-13 ENCOUNTER — Inpatient Hospital Stay: Payer: Federal, State, Local not specified - PPO

## 2020-12-13 VITALS — BP 108/70 | HR 63 | Temp 97.6°F | Resp 16 | Ht 75.0 in | Wt 139.2 lb

## 2020-12-13 DIAGNOSIS — C3492 Malignant neoplasm of unspecified part of left bronchus or lung: Secondary | ICD-10-CM

## 2020-12-13 DIAGNOSIS — C3432 Malignant neoplasm of lower lobe, left bronchus or lung: Secondary | ICD-10-CM | POA: Diagnosis not present

## 2020-12-13 DIAGNOSIS — Z95828 Presence of other vascular implants and grafts: Secondary | ICD-10-CM

## 2020-12-13 DIAGNOSIS — C3412 Malignant neoplasm of upper lobe, left bronchus or lung: Secondary | ICD-10-CM | POA: Diagnosis not present

## 2020-12-13 DIAGNOSIS — N289 Disorder of kidney and ureter, unspecified: Secondary | ICD-10-CM | POA: Diagnosis not present

## 2020-12-13 DIAGNOSIS — Z79899 Other long term (current) drug therapy: Secondary | ICD-10-CM | POA: Diagnosis not present

## 2020-12-13 DIAGNOSIS — R197 Diarrhea, unspecified: Secondary | ICD-10-CM | POA: Diagnosis not present

## 2020-12-13 DIAGNOSIS — Z5111 Encounter for antineoplastic chemotherapy: Secondary | ICD-10-CM

## 2020-12-13 DIAGNOSIS — C7951 Secondary malignant neoplasm of bone: Secondary | ICD-10-CM | POA: Diagnosis not present

## 2020-12-13 LAB — CMP (CANCER CENTER ONLY)
ALT: 301 U/L (ref 0–44)
AST: 143 U/L — ABNORMAL HIGH (ref 15–41)
Albumin: 2.8 g/dL — ABNORMAL LOW (ref 3.5–5.0)
Alkaline Phosphatase: 352 U/L — ABNORMAL HIGH (ref 38–126)
Anion gap: 9 (ref 5–15)
BUN: 45 mg/dL — ABNORMAL HIGH (ref 8–23)
CO2: 17 mmol/L — ABNORMAL LOW (ref 22–32)
Calcium: 8.9 mg/dL (ref 8.9–10.3)
Chloride: 113 mmol/L — ABNORMAL HIGH (ref 98–111)
Creatinine: 2.1 mg/dL — ABNORMAL HIGH (ref 0.61–1.24)
GFR, Estimated: 34 mL/min — ABNORMAL LOW
Glucose, Bld: 100 mg/dL — ABNORMAL HIGH (ref 70–99)
Potassium: 2.9 mmol/L — ABNORMAL LOW (ref 3.5–5.1)
Sodium: 139 mmol/L (ref 135–145)
Total Bilirubin: 2.1 mg/dL — ABNORMAL HIGH (ref 0.3–1.2)
Total Protein: 6.8 g/dL (ref 6.5–8.1)

## 2020-12-13 LAB — CBC WITH DIFFERENTIAL (CANCER CENTER ONLY)
Abs Immature Granulocytes: 0.26 10*3/uL — ABNORMAL HIGH (ref 0.00–0.07)
Basophils Absolute: 0.1 10*3/uL (ref 0.0–0.1)
Basophils Relative: 0 %
Eosinophils Absolute: 0.3 10*3/uL (ref 0.0–0.5)
Eosinophils Relative: 2 %
HCT: 37.3 % — ABNORMAL LOW (ref 39.0–52.0)
Hemoglobin: 13 g/dL (ref 13.0–17.0)
Immature Granulocytes: 2 %
Lymphocytes Relative: 11 %
Lymphs Abs: 1.8 10*3/uL (ref 0.7–4.0)
MCH: 28.4 pg (ref 26.0–34.0)
MCHC: 34.9 g/dL (ref 30.0–36.0)
MCV: 81.4 fL (ref 80.0–100.0)
Monocytes Absolute: 1.6 10*3/uL — ABNORMAL HIGH (ref 0.1–1.0)
Monocytes Relative: 10 %
Neutro Abs: 11.7 10*3/uL — ABNORMAL HIGH (ref 1.7–7.7)
Neutrophils Relative %: 75 %
Platelet Count: 364 10*3/uL (ref 150–400)
RBC: 4.58 MIL/uL (ref 4.22–5.81)
RDW: 16.6 % — ABNORMAL HIGH (ref 11.5–15.5)
WBC Count: 15.6 10*3/uL — ABNORMAL HIGH (ref 4.0–10.5)
nRBC: 0 % (ref 0.0–0.2)

## 2020-12-13 MED ORDER — HEPARIN SOD (PORK) LOCK FLUSH 100 UNIT/ML IV SOLN
500.0000 [IU] | Freq: Once | INTRAVENOUS | Status: AC
  Administered 2020-12-13: 500 [IU]

## 2020-12-13 MED ORDER — SODIUM CHLORIDE 0.9% FLUSH
10.0000 mL | Freq: Once | INTRAVENOUS | Status: AC
  Administered 2020-12-13: 10 mL

## 2020-12-13 NOTE — Progress Notes (Signed)
I spoke with patient today during clinic.  Dr. Julien Nordmann addressed his issues.  He verbalized understanding of plan of care.

## 2020-12-13 NOTE — Telephone Encounter (Signed)
CRITICAL VALUE STICKER  CRITICAL VALUE: ALT 301  RECEIVER (on-site recipient of call): Pau Banh P. LPN  DATE & TIME NOTIFIED: 12/13/2020 9: 43 am  MESSENGER (representative from lab): Rosann Auerbach  MD NOTIFIED: Dr. Earlie Server  TIME OF NOTIFICATION: 9:57 am

## 2020-12-13 NOTE — Progress Notes (Signed)
[facility] Telephone:(336) [phone]   Fax:(336) [phone]  OFFICE PROGRESS NOTE  [name], MD [address] [place] [address]  DIAGNOSIS: Metastatic non-small cell lung cancer, adenocarcinoma initially diagnosed as stage IB (T2a, N0, M0) non-small cell lung cancer, adenocarcinoma diagnosed in December 2017.  The patient has evidence for disease recurrence in July 2019.  Biomarker Findings Tumor Mutational Burden - TMB-Intermediate (16 Muts/Mb) Microsatellite status - MS-Stable Genomic Findings For a complete list of the genes assayed, please refer to the Appendix. KIT amplification KRAS I78M SMAD4 splice site [id number]>O BS96 I232F 7 Disease relevant genes with no reportable alterations: EGFR, ALK, BRAF, MET, RET, ERBB2, ROS1   PDL 1 expression is 0%  PRIOR THERAPY:  1) status post left lower lobectomy as well as wedge resection of the left upper lobe on 05/11/2016. 2) Adjuvant systemic chemotherapy with cisplatin 75 MG/M2 and Alimta 500 MG/M2 every 3 weeks. First dose 07/03/2016. Status post 4 cycles. 3) Systemic chemotherapy with carboplatin for AUC of 5, Alimta 500 mg/M2 and Keytruda 200 mg IV every 3 weeks.  Status post 47 cycles.  Starting from cycle #5 the patient will be treated with maintenance Alimta and Ketruda (pembrolizumab) every 3 weeks.  This treatment was discontinued secondary to disease progression.  CURRENT THERAPY: Lumakras (Sotorasib) 960 mg p.o. daily started October 29, 2020.  Status post 1 months of treatment.  INTERVAL HISTORY: [name] 65 y.o. male returns to the clinic today for follow-up visit accompanied by his wife.  The patient continues to complain of generalized fatigue and weakness.  He has a fall few days ago when he missed stepped.  He he was found to have liver dysfunction secondary to Lumakras (Sotorasib) and he is currently on a tapered dose of prednisone started at 60 mg p.o. daily for 1 week and starting  from tomorrow he will be on 40 mg p.o. daily.  The patient denied having any chest pain, shortness of breath, cough or hemoptysis.  He denied having any fever or chills.  He has no nausea, vomiting, diarrhea or constipation.  He has no headache or visual changes.  The patient is here today for evaluation and repeat blood work.  MEDICAL HISTORY: Past Medical History:  Diagnosis Date   AAA (abdominal aortic aneurysm) ([place])    AAA (abdominal aortic aneurysm) without rupture ([place]) 02/04/2014   Cancer ([place])    lung   Encounter for antineoplastic chemotherapy 06/06/2016   History of hepatitis B    HNP (herniated nucleus pulposus), lumbar    L4 with radiculopathy   Hypertension    Lung cancer ([place]) 05/18/2016   Malignant neoplasm of lower lobe of left lung ([place]) 04/05/2016   Mass of left lung    Mitral insufficiency 04/06/2016   This patient will eventually need MV repair if his prognosis is good from oncology standpoint. However, his lung cancer therapy  is the priority right now. His cardiac condition won't preclude possible lung surgery.  Once his lung cancer is under control he will need a TEE to further evaluate his mitral valve anatomy and MR severity, and also have ischemic workup as tere is evidence on calcificati   Renal insufficiency 10/09/2016   S/P partial lobectomy of lung 05/11/2016   Varicose veins of legs     ALLERGIES:  is allergic to tramadol.  MEDICATIONS:  Current Outpatient Medications  Medication Sig Dispense Refill   carvedilol (COREG) 6.25  MG tablet Take 1 tablet (6.25 mg total) by mouth 2 (two) times daily. 180 tablet 3   lisinopril (ZESTRIL) 2.5 MG tablet TAKE 1 TABLET BY MOUTH EVERY DAY 90 tablet 3   predniSONE (DELTASONE) 10 MG tablet 6 tablets p.o. daily for 1 week followed by 4 tablet p.o. daily for 1 week followed by 2 tablet p.o. daily for 1 week followed by 1 tablet p.o. daily for 1 week then stop. 91 tablet 0   sotorasib (LUMAKRAS) 120 MG TABS Take 8 tablets  (960 mg) by mouth daily. 240 tablet 2   No current facility-administered medications for this visit.    SURGICAL HISTORY:  Past Surgical History:  Procedure Laterality Date   ABDOMINAL AORTIC ENDOVASCULAR STENT GRAFT N/A 03/12/2016   Procedure: ABDOMINAL AORTIC ENDOVASCULAR STENT GRAFT;  Surgeon: Conrad Sully, MD;  Location: [facility];  Service: Vascular;  Laterality: N/A;   COLONOSCOPY     INGUINAL HERNIA REPAIR Left 1975   Left inguinal hernia   INGUINAL HERNIA REPAIR Right    IR IMAGING GUIDED PORT INSERTION  09/04/2019   LOBECTOMY Left 05/11/2016   Procedure: LEFT LOWER LOBE LOBECTOMY AND LEFT UPPER LOBE  RESECTION AND PLACEMENT OF ON-Q;  Surgeon: [name], MD;  Location: [facility];  Service: Thoracic;  Laterality: Left;   LUMBAR LAMINECTOMY  October 22, 2012   LYMPH NODE DISSECTION Left 05/11/2016   Procedure: LYMPH NODE DISSECTION;  Surgeon: [name], MD;  Location: [facility];  Service: Thoracic;  Laterality: Left;   RIGHT/LEFT HEART CATH AND CORONARY ANGIOGRAPHY N/A 03/10/2020   Procedure: RIGHT/LEFT HEART CATH AND CORONARY ANGIOGRAPHY;  Surgeon: Sherren Mocha, MD;  Location: [facility];  Service: Cardiovascular;  Laterality: N/A;   STAPLING OF BLEBS Left 05/11/2016   Procedure: STAPLING OF APICAL BLEB;  Surgeon: [name], MD;  Location: [place];  Service: Thoracic;  Laterality: Left;   TEE WITHOUT CARDIOVERSION N/A 10/07/2019   Procedure: TRANSESOPHAGEAL ECHOCARDIOGRAM (TEE);  Surgeon: Dorothy Spark, MD;  Location: [facility] END[facility];  Service: Cardiovascular;  Laterality: N/A;   VIDEO ASSISTED THORACOSCOPY Left 05/18/2016   Procedure: VIDEO ASSISTED THORACOSCOPY WITH REMOVAL OF LEFT APICAL BLEB;  Surgeon: [name], MD;  Location: [place] [facility];  Service: Thoracic;  Laterality: Left;   VIDEO ASSISTED THORACOSCOPY (VATS)/WEDGE RESECTION Left 05/11/2016   Procedure: LEFT VIDEO ASSISTED THORACOSCOPY (VATS);  Surgeon: [name], MD;  Location: [facility];  Service:  Thoracic;  Laterality: Left;   VIDEO BRONCHOSCOPY N/A 05/11/2016   Procedure: VIDEO BRONCHOSCOPY, LEFT LUNG;  Surgeon: [name], MD;  Location: [place];  Service: Thoracic;  Laterality: N/A;   VIDEO BRONCHOSCOPY N/A 05/18/2016   Procedure: VIDEO BRONCHOSCOPY WITH BRONCHIAL WASHING;  Surgeon: [name], MD;  Location: [facility];  Service: Thoracic;  Laterality: N/A;    REVIEW OF SYSTEMS:  A comprehensive review of systems was negative except for: Constitutional: positive for fatigue Musculoskeletal: positive for muscle weakness   PHYSICAL EXAMINATION: General appearance: alert, cooperative, fatigued, and no distress Head: Normocephalic, without obvious abnormality, atraumatic Neck: no adenopathy, no JVD, supple, symmetrical, trachea midline, and thyroid not enlarged, symmetric, no tenderness/mass/nodules Lymph nodes: Cervical, supraclavicular, and axillary nodes normal. Resp: clear to auscultation bilaterally Back: symmetric, no curvature. ROM normal. No CVA tenderness. Cardio: regular rate and rhythm, S1, S2 normal, no murmur, click, rub or gallop GI: soft, non-tender; bowel sounds normal; no masses,  no organomegaly Extremities: extremities normal, atraumatic, no cyanosis or edema  ECOG PERFORMANCE STATUS: 1 - Symptomatic but completely ambulatory  Blood pressure 108/70, pulse 63, temperature 97.6 F (36.4 C), temperature source Tympanic, resp. rate 16, height 6' 3" (1.905 m), weight 139 lb 3.2 oz (63.1 kg), SpO2 99 %.  LABORATORY DATA: Lab Results  Component Value Date   WBC 15.6 (H) 12/13/2020   HGB 13.0 12/13/2020   HCT 37.3 (L) 12/13/2020   MCV 81.4 12/13/2020   PLT 364 12/13/2020      Chemistry      Component Value Date/Time   NA 138 12/05/2020 1044   NA 132 (L) 10/05/2019 0830   NA 138 01/11/2017 1343   K 3.2 (L) 12/05/2020 1044   K 4.7 01/11/2017 1343   CL 111 12/05/2020 1044   CO2 16 (L) 12/05/2020 1044   CO2 23 01/11/2017 1343   BUN 32 (H) 12/05/2020  1044   BUN 14 10/05/2019 0830   BUN 20.2 01/11/2017 1343   CREATININE 2.47 (H) 12/05/2020 1044   CREATININE 1.6 (H) 01/11/2017 1343      Component Value Date/Time   CALCIUM 9.6 12/05/2020 1044   CALCIUM 9.3 01/11/2017 1343   ALKPHOS 353 (H) 12/05/2020 1044   ALKPHOS 89 01/11/2017 1343   AST 300 (HH) 12/05/2020 1044   AST 41 (H) 01/11/2017 1343   ALT 343 (HH) 12/05/2020 1044   ALT 27 01/11/2017 1343   BILITOT 1.5 (H) 12/05/2020 1044   BILITOT 0.49 01/11/2017 1343       RADIOGRAPHIC STUDIES: No results found.  ASSESSMENT AND PLAN:  This is a very pleasant 65 years old white male with a stage IB non-small cell lung cancer, adenocarcinoma status post wedge resection of the left upper lobe followed by 4 cycles of adjuvant systemic chemotherapy with cisplatin and Alimta and he tolerated his treatment well except for fatigue. The patient has been on observation since June 2018.   The recent imaging studies including CT scan of the chest as well as a PET scan showed evidence for disease recurrence with metastatic disease presented with bilateral pulmonary nodules as well as destructive bone lesions at T4 vertebral body, biopsy proven to be metastatic adenocarcinoma. Molecular studies by foundation 1 showed KRAS G12C mutation.  PDL 1 expression was negative. The patient was treated with carboplatin, Alimta and Ketruda (pembrolizumab) status post 47 cycles.  Starting from cycle #5 he is on maintenance treatment with Alimta and Keytruda every 3 weeks. He has been on treatment with single agent Keytruda recently because of the renal insufficiency.  The patient has been tolerating this treatment well with no concerning complaints.  This treatment was discontinued secondary to disease progression. He started second line treatment with Lumakras (Sotorasib) 960 mg p.o. daily on October 29, 2020. Unfortunately the patient had significant liver dysfunction secondary to the treatment with Lumakras  (Sotorasib).  His treatment is currently on hold started last week. I started him on high-dose prednisone 60 mg p.o. daily for 1 week and starting tomorrow he will be on 40 mg p.o. daily for 1 week then 20 mg p.o. daily for 1 week then 10 mg p.o. daily for 1 week. CBC today is unremarkable except for the expected leukocytosis from the steroid treatment.  Comprehensive metabolic panel is still pending. I recommended for the patient to continue with a tapered dose of prednisone as planned. I will see him back for follow-up visit in 2 weeks for evaluation and repeat CBC and comprehensive metabolic panel. The patient was advised to call immediately if  he has any concerning symptoms in the interval. The patient voices understanding of current disease status and treatment options and is in agreement with the current care plan. All questions were answered. The patient knows to call the clinic with any problems, questions or concerns. We can certainly see the patient much sooner if necessary. Disclaimer: This note was dictated with voice recognition software. Similar sounding words can inadvertently be transcribed and may be missed upon review. Eilleen Kempf, MD 12/13/20

## 2020-12-20 ENCOUNTER — Telehealth: Payer: Self-pay | Admitting: Dietician

## 2020-12-20 NOTE — Telephone Encounter (Signed)
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 He is s/p LL lobectomy and LU wedge resection in 2018. Patient started oral Sotorsaib 7/9 (on hold due to liver dysfunction)  Patient is followed by Dr. Julien Nordmann.   Spoke with patient via telephone this morning. Introduced self and services available at [facility]. Patient reports he was sleeping, but agreeable to brief telephone visit today. Patient reports his appetite is improving and he is eating better since treatment has been held. Patient reports diarrhea has resolved. He reports usually eating 3 meals daily, snacks on applesauce, bananas, drinks water, juices, Ensure Original, sometimes he will have a soda. Patient eats grits, eggs, oatmeal for breakfast, but does not always eat breakfast meal, recalls sandwiches for lunch, balanced dinner (chicken, rice, veggies).   Nutrition Focused Physical Exam: unable to complete at this time   Medications: Coreg, Prednisone   Labs: 8/23 - K 2.9, BUN 45, Cr 2.10, Glucose 100, Alkaline Phosphatase 352, AST 143, ALT 301   Anthropometrics:   Height: 6'3" Weight: 139 lb 3.2 oz (8/23) UBW: 158 lb (Dec 2021) BMI: 17.4    NUTRITION DIAGNOSIS: Unintentional weight loss related to cancer and associated treatments as evidenced by reported diarrhea, 2.8% (4 lb) wt loss in the last month and 12% (19 lb) decrease in weights in the last 8 months. This is insignificant for time frame, however concerning given patient is underweight.    MALNUTRITION DIAGNOSIS: Suspect patient likely meets criteria for degree of malnutrition, however unable to identify at this time   INTERVENTION:  Educated on the importance of adequate calories and protein intake to maintain weights, strength, nutrition Discussed eating smaller meals and snacks more frequently with adequate calories and protein, provided suggestions on snacks - will mail handout and  recipes Discussed ways to add calories and protein to foods Continue drinking Ensure supplement, recommended switching to Ensure Plus/equivalent and drinking 2 daily - will mail coupons Discussed strategies for diarrhea - will mail handout Contact information provided    MONITORING, EVALUATION, GOAL: Patient will tolerate increased calories and protein to promote weight gain   Next Visit: via telephone in 4 weeks

## 2020-12-27 ENCOUNTER — Inpatient Hospital Stay: Payer: Federal, State, Local not specified - PPO | Attending: Internal Medicine

## 2020-12-27 ENCOUNTER — Encounter: Payer: Self-pay | Admitting: Internal Medicine

## 2020-12-27 ENCOUNTER — Other Ambulatory Visit: Payer: Self-pay

## 2020-12-27 DIAGNOSIS — J439 Emphysema, unspecified: Secondary | ICD-10-CM | POA: Diagnosis not present

## 2020-12-27 DIAGNOSIS — R0902 Hypoxemia: Secondary | ICD-10-CM | POA: Diagnosis not present

## 2020-12-27 DIAGNOSIS — Z85118 Personal history of other malignant neoplasm of bronchus and lung: Secondary | ICD-10-CM | POA: Diagnosis not present

## 2020-12-27 DIAGNOSIS — I959 Hypotension, unspecified: Secondary | ICD-10-CM | POA: Diagnosis not present

## 2020-12-27 DIAGNOSIS — N179 Acute kidney failure, unspecified: Secondary | ICD-10-CM | POA: Diagnosis not present

## 2020-12-27 DIAGNOSIS — E860 Dehydration: Secondary | ICD-10-CM | POA: Diagnosis not present

## 2020-12-27 DIAGNOSIS — R197 Diarrhea, unspecified: Secondary | ICD-10-CM | POA: Diagnosis not present

## 2020-12-27 DIAGNOSIS — Z681 Body mass index (BMI) 19 or less, adult: Secondary | ICD-10-CM | POA: Diagnosis not present

## 2020-12-27 DIAGNOSIS — C3412 Malignant neoplasm of upper lobe, left bronchus or lung: Secondary | ICD-10-CM | POA: Insufficient documentation

## 2020-12-27 DIAGNOSIS — R7989 Other specified abnormal findings of blood chemistry: Secondary | ICD-10-CM | POA: Diagnosis not present

## 2020-12-27 DIAGNOSIS — I129 Hypertensive chronic kidney disease with stage 1 through stage 4 chronic kidney disease, or unspecified chronic kidney disease: Secondary | ICD-10-CM | POA: Diagnosis not present

## 2020-12-27 DIAGNOSIS — E440 Moderate protein-calorie malnutrition: Secondary | ICD-10-CM | POA: Diagnosis not present

## 2020-12-27 DIAGNOSIS — M5116 Intervertebral disc disorders with radiculopathy, lumbar region: Secondary | ICD-10-CM | POA: Diagnosis not present

## 2020-12-27 DIAGNOSIS — N1832 Chronic kidney disease, stage 3b: Secondary | ICD-10-CM | POA: Diagnosis not present

## 2020-12-27 DIAGNOSIS — Z95828 Presence of other vascular implants and grafts: Secondary | ICD-10-CM

## 2020-12-27 DIAGNOSIS — C3432 Malignant neoplasm of lower lobe, left bronchus or lung: Secondary | ICD-10-CM

## 2020-12-27 DIAGNOSIS — I248 Other forms of acute ischemic heart disease: Secondary | ICD-10-CM | POA: Diagnosis not present

## 2020-12-27 DIAGNOSIS — I714 Abdominal aortic aneurysm, without rupture: Secondary | ICD-10-CM | POA: Diagnosis not present

## 2020-12-27 DIAGNOSIS — Z20822 Contact with and (suspected) exposure to covid-19: Secondary | ICD-10-CM | POA: Diagnosis not present

## 2020-12-27 DIAGNOSIS — I083 Combined rheumatic disorders of mitral, aortic and tricuspid valves: Secondary | ICD-10-CM | POA: Diagnosis not present

## 2020-12-27 DIAGNOSIS — Z79899 Other long term (current) drug therapy: Secondary | ICD-10-CM | POA: Insufficient documentation

## 2020-12-27 DIAGNOSIS — E8809 Other disorders of plasma-protein metabolism, not elsewhere classified: Secondary | ICD-10-CM | POA: Diagnosis not present

## 2020-12-27 LAB — CBC WITH DIFFERENTIAL (CANCER CENTER ONLY)
Abs Immature Granulocytes: 0.15 10*3/uL — ABNORMAL HIGH (ref 0.00–0.07)
Basophils Absolute: 0.1 10*3/uL (ref 0.0–0.1)
Basophils Relative: 0 %
Eosinophils Absolute: 0.2 10*3/uL (ref 0.0–0.5)
Eosinophils Relative: 2 %
HCT: 39.6 % (ref 39.0–52.0)
Hemoglobin: 13 g/dL (ref 13.0–17.0)
Immature Granulocytes: 1 %
Lymphocytes Relative: 10 %
Lymphs Abs: 1.2 10*3/uL (ref 0.7–4.0)
MCH: 29.5 pg (ref 26.0–34.0)
MCHC: 32.8 g/dL (ref 30.0–36.0)
MCV: 89.8 fL (ref 80.0–100.0)
Monocytes Absolute: 1 10*3/uL (ref 0.1–1.0)
Monocytes Relative: 8 %
Neutro Abs: 9.5 10*3/uL — ABNORMAL HIGH (ref 1.7–7.7)
Neutrophils Relative %: 79 %
Platelet Count: 223 10*3/uL (ref 150–400)
RBC: 4.41 MIL/uL (ref 4.22–5.81)
RDW: 17.7 % — ABNORMAL HIGH (ref 11.5–15.5)
WBC Count: 12 10*3/uL — ABNORMAL HIGH (ref 4.0–10.5)
nRBC: 0 % (ref 0.0–0.2)

## 2020-12-27 LAB — CMP (CANCER CENTER ONLY)
ALT: 121 U/L — ABNORMAL HIGH (ref 0–44)
AST: 73 U/L — ABNORMAL HIGH (ref 15–41)
Albumin: 2.9 g/dL — ABNORMAL LOW (ref 3.5–5.0)
Alkaline Phosphatase: 295 U/L — ABNORMAL HIGH (ref 38–126)
Anion gap: 11 (ref 5–15)
BUN: 27 mg/dL — ABNORMAL HIGH (ref 8–23)
CO2: 21 mmol/L — ABNORMAL LOW (ref 22–32)
Calcium: 8.9 mg/dL (ref 8.9–10.3)
Chloride: 106 mmol/L (ref 98–111)
Creatinine: 1.82 mg/dL — ABNORMAL HIGH (ref 0.61–1.24)
GFR, Estimated: 40 mL/min — ABNORMAL LOW (ref >60)
Glucose, Bld: 76 mg/dL (ref 70–99)
Potassium: 4.1 mmol/L (ref 3.5–5.1)
Sodium: 138 mmol/L (ref 135–145)
Total Bilirubin: 0.9 mg/dL (ref 0.3–1.2)
Total Protein: 6.5 g/dL (ref 6.5–8.1)

## 2020-12-27 LAB — TSH: TSH: 1.161 u[IU]/mL (ref 0.320–4.118)

## 2020-12-27 MED ORDER — HEPARIN SOD (PORK) LOCK FLUSH 100 UNIT/ML IV SOLN
500.0000 [IU] | Freq: Once | INTRAVENOUS | Status: AC
  Administered 2020-12-27: 500 [IU]

## 2020-12-27 MED ORDER — SODIUM CHLORIDE 0.9% FLUSH
10.0000 mL | Freq: Once | INTRAVENOUS | Status: AC
  Administered 2020-12-27: 10 mL

## 2020-12-28 ENCOUNTER — Inpatient Hospital Stay (HOSPITAL_COMMUNITY)
Admission: EM | Admit: 2020-12-28 | Discharge: 2020-12-30 | DRG: 683 | Disposition: A | Discharge status: Home Health | Payer: Federal, State, Local not specified - PPO | Attending: Internal Medicine | Admitting: Internal Medicine

## 2020-12-28 ENCOUNTER — Ambulatory Visit (HOSPITAL_COMMUNITY)
Admission: RE | Admit: 2020-12-28 | Discharge: 2020-12-28 | Disposition: A | Discharge status: Home / Self Care | Payer: Federal, State, Local not specified - PPO | Source: Ambulatory Visit | Attending: Internal Medicine | Admitting: Internal Medicine

## 2020-12-28 ENCOUNTER — Telehealth: Payer: Self-pay | Admitting: Medical Oncology

## 2020-12-28 ENCOUNTER — Emergency Department (HOSPITAL_COMMUNITY): Payer: Federal, State, Local not specified - PPO

## 2020-12-28 DIAGNOSIS — K449 Diaphragmatic hernia without obstruction or gangrene: Secondary | ICD-10-CM | POA: Diagnosis not present

## 2020-12-28 DIAGNOSIS — I9589 Other hypotension: Secondary | ICD-10-CM | POA: Diagnosis not present

## 2020-12-28 DIAGNOSIS — I959 Hypotension, unspecified: Secondary | ICD-10-CM | POA: Diagnosis not present

## 2020-12-28 DIAGNOSIS — C3492 Malignant neoplasm of unspecified part of left bronchus or lung: Secondary | ICD-10-CM

## 2020-12-28 DIAGNOSIS — I248 Other forms of acute ischemic heart disease: Secondary | ICD-10-CM | POA: Diagnosis present

## 2020-12-28 DIAGNOSIS — M5116 Intervertebral disc disorders with radiculopathy, lumbar region: Secondary | ICD-10-CM | POA: Diagnosis present

## 2020-12-28 DIAGNOSIS — I714 Abdominal aortic aneurysm, without rupture: Secondary | ICD-10-CM | POA: Diagnosis present

## 2020-12-28 DIAGNOSIS — Z85118 Personal history of other malignant neoplasm of bronchus and lung: Secondary | ICD-10-CM | POA: Diagnosis not present

## 2020-12-28 DIAGNOSIS — N1832 Chronic kidney disease, stage 3b: Secondary | ICD-10-CM | POA: Diagnosis present

## 2020-12-28 DIAGNOSIS — Z9221 Personal history of antineoplastic chemotherapy: Secondary | ICD-10-CM | POA: Diagnosis not present

## 2020-12-28 DIAGNOSIS — Z681 Body mass index (BMI) 19 or less, adult: Secondary | ICD-10-CM

## 2020-12-28 DIAGNOSIS — R0902 Hypoxemia: Secondary | ICD-10-CM | POA: Diagnosis present

## 2020-12-28 DIAGNOSIS — R509 Fever, unspecified: Secondary | ICD-10-CM | POA: Diagnosis not present

## 2020-12-28 DIAGNOSIS — R0602 Shortness of breath: Secondary | ICD-10-CM | POA: Diagnosis not present

## 2020-12-28 DIAGNOSIS — R778 Other specified abnormalities of plasma proteins: Secondary | ICD-10-CM | POA: Insufficient documentation

## 2020-12-28 DIAGNOSIS — J439 Emphysema, unspecified: Secondary | ICD-10-CM | POA: Diagnosis not present

## 2020-12-28 DIAGNOSIS — E8809 Other disorders of plasma-protein metabolism, not elsewhere classified: Secondary | ICD-10-CM | POA: Diagnosis present

## 2020-12-28 DIAGNOSIS — Z20822 Contact with and (suspected) exposure to covid-19: Secondary | ICD-10-CM | POA: Diagnosis present

## 2020-12-28 DIAGNOSIS — Z888 Allergy status to other drugs, medicaments and biological substances status: Secondary | ICD-10-CM

## 2020-12-28 DIAGNOSIS — N179 Acute kidney failure, unspecified: Secondary | ICD-10-CM | POA: Diagnosis not present

## 2020-12-28 DIAGNOSIS — Z87891 Personal history of nicotine dependence: Secondary | ICD-10-CM | POA: Diagnosis not present

## 2020-12-28 DIAGNOSIS — J90 Pleural effusion, not elsewhere classified: Secondary | ICD-10-CM | POA: Diagnosis not present

## 2020-12-28 DIAGNOSIS — E861 Hypovolemia: Secondary | ICD-10-CM | POA: Diagnosis not present

## 2020-12-28 DIAGNOSIS — R7989 Other specified abnormal findings of blood chemistry: Secondary | ICD-10-CM | POA: Diagnosis not present

## 2020-12-28 DIAGNOSIS — E860 Dehydration: Secondary | ICD-10-CM | POA: Diagnosis present

## 2020-12-28 DIAGNOSIS — Z902 Acquired absence of lung [part of]: Secondary | ICD-10-CM

## 2020-12-28 DIAGNOSIS — I083 Combined rheumatic disorders of mitral, aortic and tricuspid valves: Secondary | ICD-10-CM | POA: Diagnosis present

## 2020-12-28 DIAGNOSIS — R0603 Acute respiratory distress: Secondary | ICD-10-CM | POA: Diagnosis not present

## 2020-12-28 DIAGNOSIS — R911 Solitary pulmonary nodule: Secondary | ICD-10-CM | POA: Diagnosis not present

## 2020-12-28 DIAGNOSIS — R197 Diarrhea, unspecified: Secondary | ICD-10-CM | POA: Diagnosis not present

## 2020-12-28 DIAGNOSIS — Z21 Asymptomatic human immunodeficiency virus [HIV] infection status: Secondary | ICD-10-CM | POA: Diagnosis not present

## 2020-12-28 DIAGNOSIS — Z79899 Other long term (current) drug therapy: Secondary | ICD-10-CM | POA: Diagnosis not present

## 2020-12-28 DIAGNOSIS — I129 Hypertensive chronic kidney disease with stage 1 through stage 4 chronic kidney disease, or unspecified chronic kidney disease: Secondary | ICD-10-CM | POA: Diagnosis present

## 2020-12-28 DIAGNOSIS — E440 Moderate protein-calorie malnutrition: Secondary | ICD-10-CM | POA: Diagnosis present

## 2020-12-28 DIAGNOSIS — K719 Toxic liver disease, unspecified: Secondary | ICD-10-CM | POA: Diagnosis not present

## 2020-12-28 DIAGNOSIS — B182 Chronic viral hepatitis C: Secondary | ICD-10-CM | POA: Diagnosis not present

## 2020-12-28 DIAGNOSIS — Z5112 Encounter for antineoplastic immunotherapy: Secondary | ICD-10-CM

## 2020-12-28 DIAGNOSIS — C3490 Malignant neoplasm of unspecified part of unspecified bronchus or lung: Secondary | ICD-10-CM

## 2020-12-28 DIAGNOSIS — I700 Atherosclerosis of aorta: Secondary | ICD-10-CM | POA: Diagnosis not present

## 2020-12-28 LAB — CBC WITH DIFFERENTIAL/PLATELET
Abs Immature Granulocytes: 0.28 10*3/uL — ABNORMAL HIGH (ref 0.00–0.07)
Basophils Absolute: 0 10*3/uL (ref 0.0–0.1)
Basophils Relative: 0 %
Eosinophils Absolute: 0.2 10*3/uL (ref 0.0–0.5)
Eosinophils Relative: 1 %
HCT: 45.5 % (ref 39.0–52.0)
Hemoglobin: 14.4 g/dL (ref 13.0–17.0)
Immature Granulocytes: 2 %
Lymphocytes Relative: 3 %
Lymphs Abs: 0.5 10*3/uL — ABNORMAL LOW (ref 0.7–4.0)
MCH: 29 pg (ref 26.0–34.0)
MCHC: 31.6 g/dL (ref 30.0–36.0)
MCV: 91.7 fL (ref 80.0–100.0)
Monocytes Absolute: 1 10*3/uL (ref 0.1–1.0)
Monocytes Relative: 7 %
Neutro Abs: 14 10*3/uL — ABNORMAL HIGH (ref 1.7–7.7)
Neutrophils Relative %: 87 %
Platelets: 201 10*3/uL (ref 150–400)
RBC: 4.96 MIL/uL (ref 4.22–5.81)
RDW: 17.8 % — ABNORMAL HIGH (ref 11.5–15.5)
WBC: 16 10*3/uL — ABNORMAL HIGH (ref 4.0–10.5)
nRBC: 0 % (ref 0.0–0.2)

## 2020-12-28 LAB — RESP PANEL BY RT-PCR (FLU A&B, COVID) ARPGX2
Influenza A by PCR: NEGATIVE
Influenza B by PCR: NEGATIVE
SARS Coronavirus 2 by RT PCR: NEGATIVE

## 2020-12-28 LAB — COMPREHENSIVE METABOLIC PANEL
ALT: 111 U/L — ABNORMAL HIGH (ref 0–44)
AST: 69 U/L — ABNORMAL HIGH (ref 15–41)
Albumin: 3.4 g/dL — ABNORMAL LOW (ref 3.5–5.0)
Alkaline Phosphatase: 289 U/L — ABNORMAL HIGH (ref 38–126)
Anion gap: 7 (ref 5–15)
BUN: 39 mg/dL — ABNORMAL HIGH (ref 8–23)
CO2: 21 mmol/L — ABNORMAL LOW (ref 22–32)
Calcium: 9.6 mg/dL (ref 8.9–10.3)
Chloride: 110 mmol/L (ref 98–111)
Creatinine, Ser: 2.43 mg/dL — ABNORMAL HIGH (ref 0.61–1.24)
GFR, Estimated: 28 mL/min — ABNORMAL LOW (ref >60)
Glucose, Bld: 86 mg/dL (ref 70–99)
Potassium: 4.6 mmol/L (ref 3.5–5.1)
Sodium: 138 mmol/L (ref 135–145)
Total Bilirubin: 1.3 mg/dL — ABNORMAL HIGH (ref 0.3–1.2)
Total Protein: 7.2 g/dL (ref 6.5–8.1)

## 2020-12-28 LAB — LIPASE, BLOOD: Lipase: 84 U/L — ABNORMAL HIGH (ref 11–51)

## 2020-12-28 LAB — TROPONIN I (HIGH SENSITIVITY)
Troponin I (High Sensitivity): 75 ng/L — ABNORMAL HIGH (ref <18)
Troponin I (High Sensitivity): 80 ng/L — ABNORMAL HIGH (ref <18)

## 2020-12-28 LAB — PROTIME-INR
INR: 1 (ref 0.8–1.2)
Prothrombin Time: 13.6 seconds (ref 11.4–15.2)

## 2020-12-28 LAB — PROCALCITONIN: Procalcitonin: 34.89 ng/mL

## 2020-12-28 LAB — MRSA NEXT GEN BY PCR, NASAL: MRSA by PCR Next Gen: NOT DETECTED

## 2020-12-28 LAB — LACTIC ACID, PLASMA: Lactic Acid, Venous: 1.3 mmol/L (ref 0.5–1.9)

## 2020-12-28 MED ORDER — SODIUM CHLORIDE 0.9 % IV BOLUS
1000.0000 mL | Freq: Once | INTRAVENOUS | Status: AC
  Administered 2020-12-28: 1000 mL via INTRAVENOUS

## 2020-12-28 MED ORDER — ACETAMINOPHEN 500 MG PO TABS
1000.0000 mg | ORAL_TABLET | Freq: Once | ORAL | Status: AC
  Administered 2020-12-28: 1000 mg via ORAL
  Filled 2020-12-28: qty 2

## 2020-12-28 MED ORDER — SODIUM CHLORIDE 0.9 % IV SOLN
2.0000 g | Freq: Every day | INTRAVENOUS | Status: DC
  Administered 2020-12-28 – 2020-12-29 (×2): 2 g via INTRAVENOUS
  Filled 2020-12-28 (×2): qty 2

## 2020-12-28 MED ORDER — LACTATED RINGERS IV BOLUS
1000.0000 mL | Freq: Once | INTRAVENOUS | Status: AC
  Administered 2020-12-28: 1000 mL via INTRAVENOUS

## 2020-12-28 MED ORDER — LOPERAMIDE HCL 2 MG PO CAPS
2.0000 mg | ORAL_CAPSULE | Freq: Once | ORAL | Status: AC
  Administered 2020-12-28: 2 mg via ORAL
  Filled 2020-12-28: qty 1

## 2020-12-28 MED ORDER — HEPARIN SODIUM (PORCINE) 5000 UNIT/ML IJ SOLN
5000.0000 [IU] | Freq: Three times a day (TID) | INTRAMUSCULAR | Status: DC
  Administered 2020-12-28 – 2020-12-30 (×5): 5000 [IU] via SUBCUTANEOUS
  Filled 2020-12-28 (×5): qty 1

## 2020-12-28 MED ORDER — SODIUM CHLORIDE 0.9 % IV SOLN: INTRAVENOUS | Status: DC

## 2020-12-28 NOTE — ED Provider Notes (Signed)
[facility] DEPT Provider Note   CSN: [id number] Arrival date & time: 12/28/20  1305     History Chief Complaint  Patient presents with   Diarrhea    William Kales [name] is a 65 y.o. male.  HPI Patient has history of lung cancer.  He got a CT scan today with contrast.  This was for surveillance.  Patient had not eaten for the past 12 hour in preparation for his scan.  He reports he felt okay when he went to scan but about 20 minutes after he had left he got a sudden onset of severe weakness and diarrhea.  He reports he felt slightly short of breath with this episode but did not have any chest pain.  No syncope.  He has continued to take his blood pressure medications regularly.  He is on 2 agents, he reports he is on lisinopril and carvedilol.    Past Medical History:  Diagnosis Date   AAA (abdominal aortic aneurysm) ([place])    AAA (abdominal aortic aneurysm) without rupture ([place]) 02/04/2014   Cancer ([place])    lung   Encounter for antineoplastic chemotherapy 06/06/2016   History of hepatitis B    HNP (herniated nucleus pulposus), lumbar    L4 with radiculopathy   Hypertension    Lung cancer ([place]) 05/18/2016   Malignant neoplasm of lower lobe of left lung ([place]) 04/05/2016   Mass of left lung    Mitral insufficiency 04/06/2016   This patient will eventually need MV repair if his prognosis is good from oncology standpoint. However, his lung cancer therapy  is the priority right now. His cardiac condition won't preclude possible lung surgery.  Once his lung cancer is under control he will need a TEE to further evaluate his mitral valve anatomy and MR severity, and also have ischemic workup as tere is evidence on calcificati   Renal insufficiency 10/09/2016   S/P partial lobectomy of lung 05/11/2016   Varicose veins of legs     Patient Active Problem List   Diagnosis Date Noted   Dehydration 07/28/2020   Port-A-Cath in place 10/22/2019   Encounter for  antineoplastic immunotherapy 01/09/2018   Adenocarcinoma of left lung, stage 4 ([place]) 12/12/2017   Renal insufficiency 10/09/2016   Hypertension 08/14/2016   Goals of care, counseling/discussion 06/26/2016   Encounter for antineoplastic chemotherapy 06/06/2016   Lung cancer ([place]) 05/18/2016   S/P partial lobectomy of lung 05/11/2016   Severe mitral regurgitation 04/06/2016   Malignant neoplasm of lower lobe of left lung ([place]) 04/05/2016   AAA (abdominal aortic aneurysm) ([place]) 03/12/2016   AAA (abdominal aortic aneurysm) without rupture ([place]) 02/04/2014    Past Surgical History:  Procedure Laterality Date   ABDOMINAL AORTIC ENDOVASCULAR STENT GRAFT N/A 03/12/2016   Procedure: ABDOMINAL AORTIC ENDOVASCULAR STENT GRAFT;  Surgeon: Conrad Leitchfield, MD;  Location: [facility];  Service: Vascular;  Laterality: N/A;   COLONOSCOPY     INGUINAL HERNIA REPAIR Left 1975   Left inguinal hernia   INGUINAL HERNIA REPAIR Right    IR IMAGING GUIDED PORT INSERTION  09/04/2019   LOBECTOMY Left 05/11/2016   Procedure: LEFT LOWER LOBE LOBECTOMY AND LEFT UPPER LOBE  RESECTION AND PLACEMENT OF ON-Q;  Surgeon: [name], MD;  Location: [facility];  Service: Thoracic;  Laterality: Left;   LUMBAR LAMINECTOMY  October 22, 2012   LYMPH NODE DISSECTION Left 05/11/2016   Procedure: LYMPH NODE DISSECTION;  Surgeon: [name], MD;  Location: [facility]  OR;  Service: Thoracic;  Laterality: Left;   RIGHT/LEFT HEART CATH AND CORONARY ANGIOGRAPHY N/A 03/10/2020   Procedure: RIGHT/LEFT HEART CATH AND CORONARY ANGIOGRAPHY;  Surgeon: Sherren Mocha, MD;  Location: [facility] CV LAB;  Service: Cardiovascular;  Laterality: N/A;   STAPLING OF BLEBS Left 05/11/2016   Procedure: STAPLING OF APICAL BLEB;  Surgeon: [name], MD;  Location: [place];  Service: Thoracic;  Laterality: Left;   TEE WITHOUT CARDIOVERSION N/A 10/07/2019   Procedure: TRANSESOPHAGEAL ECHOCARDIOGRAM (TEE);  Surgeon: Dorothy Spark, MD;  Location: [facility]  ENDOSCOPY;  Service: Cardiovascular;  Laterality: N/A;   VIDEO ASSISTED THORACOSCOPY Left 05/18/2016   Procedure: VIDEO ASSISTED THORACOSCOPY WITH REMOVAL OF LEFT APICAL BLEB;  Surgeon: [name], MD;  Location: [facility];  Service: Thoracic;  Laterality: Left;   VIDEO ASSISTED THORACOSCOPY (VATS)/WEDGE RESECTION Left 05/11/2016   Procedure: LEFT VIDEO ASSISTED THORACOSCOPY (VATS);  Surgeon: [name], MD;  Location: [place];  Service: Thoracic;  Laterality: Left;   VIDEO BRONCHOSCOPY N/A 05/11/2016   Procedure: VIDEO BRONCHOSCOPY, LEFT LUNG;  Surgeon: [name], MD;  Location: [facility] OR;  Service: Thoracic;  Laterality: N/A;   VIDEO BRONCHOSCOPY N/A 05/18/2016   Procedure: VIDEO BRONCHOSCOPY WITH BRONCHIAL WASHING;  Surgeon: [name], MD;  Location: [place];  Service: Thoracic;  Laterality: N/A;       Family History  Problem Relation Age of Onset   Diabetes Mother     Social History   Tobacco Use   Smoking status: Former    Packs/day: 1.00    Types: Cigarettes    Quit date: 03/05/2016    Years since quitting: 4.8   Smokeless tobacco: Never  Vaping Use   Vaping Use: Never used  Substance Use Topics   Alcohol use: Yes    Alcohol/week: 6.0 standard drinks    Types: 6 Cans of beer per week   Drug use: Yes    Types: Cocaine, Heroin    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 Authorizing Provider  carvedilol (COREG) 6.25 MG tablet Take 1 tablet (6.25 mg total) by mouth 2 (two) times daily. 10/31/20   Sherren Mocha, MD  lisinopril (ZESTRIL) 2.5 MG tablet TAKE 1 TABLET BY MOUTH EVERY DAY 02/24/20   Dorothy Spark, MD  predniSONE (DELTASONE) 10 MG tablet 6 tablets p.o. daily for 1 week followed by 4 tablet p.o. daily for 1 week followed by 2 tablet p.o. daily for 1 week followed by 1 tablet p.o. daily for 1 week then stop. 12/05/20   Curt Bears, MD  sotorasib  (LUMAKRAS) 120 MG TABS Take 8 tablets (960 mg) by mouth daily. Patient not taking: Reported on 12/13/2020 10/28/20   Curt Bears, MD    Allergies    Tramadol  Review of Systems   Review of Systems 10 systems reviewed and negative except as per HPI Physical Exam Updated Vital Signs BP (!) 89/58   Pulse 95   Temp (!) 100.5 F (38.1 C) (Oral)   Resp (!) 26   Ht 6\' 3"  (1.905 m)   Wt 64.9 kg   SpO2 96%   BMI 17.87 kg/m   Physical Exam Constitutional:      Comments: Patient is very thin.  He is alert. Tachypnea  No respiratory distress.  Tachypnea but no respiratory distress  HENT:     Mouth/Throat:     Mouth:  Mucous membranes are dry.  Eyes:     Extraocular Movements: Extraocular movements intact.  Cardiovascular:     Comments: Borderline tachycardia no gross rub murmur gallop Pulmonary:     Effort: Pulmonary effort is normal.     Breath sounds: Normal breath sounds.  Abdominal:     General: There is no distension.     Palpations: Abdomen is soft.     Tenderness: There is no abdominal tenderness. There is no guarding.  Musculoskeletal:        General: No swelling or tenderness. Normal range of motion.     Right lower leg: No edema.     Left lower leg: No edema.  Skin:    General: Skin is warm and dry.  Neurological:     General: No focal deficit present.     Mental Status: He is oriented to person, place, and time.     Cranial Nerves: No cranial nerve deficit.     Coordination: Coordination normal.    ED Results / Procedures / Treatments   Labs (all labs ordered are listed, but only abnormal results are displayed) Labs Reviewed  COMPREHENSIVE METABOLIC PANEL - Abnormal; Notable for the following components:      Result Value   CO2 21 (*)    BUN 39 (*)    Creatinine, Ser 2.43 (*)    Albumin 3.4 (*)    AST 69 (*)    ALT 111 (*)    Alkaline Phosphatase 289 (*)    Total Bilirubin 1.3 (*)    GFR, Estimated 28 (*)    All other components within normal limits   LIPASE, BLOOD - Abnormal; Notable for the following components:   Lipase 84 (*)    All other components within normal limits  CBC WITH DIFFERENTIAL/PLATELET - Abnormal; Notable for the following components:   WBC 16.0 (*)    RDW 17.8 (*)    Neutro Abs 14.0 (*)    Lymphs Abs 0.5 (*)    Abs Immature Granulocytes 0.28 (*)    All other components within normal limits  TROPONIN I (HIGH SENSITIVITY) - Abnormal; Notable for the following components:   Troponin I (High Sensitivity) 75 (*)    All other components within normal limits  RESP PANEL BY RT-PCR (FLU A&B, COVID) ARPGX2  CULTURE, BLOOD (ROUTINE X 2)  CULTURE, BLOOD (ROUTINE X 2)  PROTIME-INR  LACTIC ACID, PLASMA  URINALYSIS, ROUTINE W REFLEX MICROSCOPIC  LACTIC ACID, PLASMA  TROPONIN I (HIGH SENSITIVITY)    EKG EKG Interpretation  Date/Time:  Wednesday December 28 2020 13:52:34 EDT Ventricular Rate:  117 PR Interval:  184 QRS Duration: 89 QT Interval:  296 QTC Calculation: 413 R Axis:   65 Text Interpretation: Sinus tachycardia Left ventricular hypertrophy early repolarization looks similar to previous tracing with increased rate related appearing ST elevation Confirmed by Charlesetta Shanks [phone]) on 12/28/2020 4:11:05 PM  Radiology CT Abdomen Pelvis Wo Contrast  Result Date: 12/28/2020 CLINICAL DATA:  Primary Cancer Type: Lung Imaging Indication: Assess response to therapy Interval therapy since last imaging? Yes Initial Cancer Diagnosis Date: 03/29/2016; Established by: Biopsy-proven Detailed Pathology: Stage Ib non-small cell lung cancer, adenocarcinoma. Primary Tumor location:   Left lower lobe. Recurrence? Yes; Date(s) of recurrence: 12/02/2017; Established by: Biopsy-proven; metastasis to T4. Surgeries: Left lower lobectomy and left upper lobe wedge resection, with stapling of apical bleb, 05/11/2016. AAA stent graft 2017. Chemotherapy: Yes; Ongoing? No; Most recent administration: 06/30/2020 Immunotherapy?  Yes; Type:  Keytruda; Ongoing? No Radiation  therapy? No Other Cancer Therapies: Lumakras daily. EXAM: CT CHEST, ABDOMEN AND PELVIS WITHOUT CONTRAST TECHNIQUE: Multidetector CT imaging of the chest, abdomen and pelvis was performed following the standard protocol without IV contrast. COMPARISON:  Most recent CT chest, abdomen and pelvis 10/17/2020. 11/12/2017 PET-CT. FINDINGS: CT CHEST FINDINGS Cardiovascular: Right chest port catheter. Aortic atherosclerosis. 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 Normal heart size. No pericardial effusion. Mediastinum/Nodes: No enlarged mediastinal, hilar, or axillary lymph nodes. Small hiatal hernia. Thyroid gland, trachea, and esophagus demonstrate no significant findings. Lungs/Pleura: Moderate centrilobular and paraseptal emphysema. Redemonstrated postoperative findings of left lower lobectomy and posterior left upper lobe wedge resection. Irregular nodular opacities seen on prior examination are diminished or resolved compared to prior examination, an index nodule of the anterior right upper lobe measuring no greater than 0.5 x 0.3 cm, previously 0.8 x 0.6 cm (series 6, image 77) and an index nodule of the peripheral left upper lobe measuring 0.7 x 0 point 4 cm, previously 1.0 x 0.5 cm (series 6, image 75). Pleural thickening and calcification of the dependent right pleura. Trace, chronic, loculated left pleural effusion. Musculoskeletal: No chest wall mass. Unchanged, mildly expansile sclerotic lesion of the spinous process of T4 (series 6, image 45). CT ABDOMEN PELVIS FINDINGS Hepatobiliary: No solid liver abnormality is seen. No gallstones, gallbladder wall thickening, or biliary dilatation. Pancreas: Unremarkable. No pancreatic ductal dilatation or surrounding inflammatory changes. Spleen: Normal in size without significant abnormality. Adrenals/Urinary Tract: Adrenal glands are unremarkable. Kidneys are normal, without renal  calculi, solid lesion, or hydronephrosis. Bladder is unremarkable. Stomach/Bowel: Stomach is within normal limits. Appendix appears normal. No evidence of bowel wall thickening, distention, or inflammatory changes. Vascular/Lymphatic: Aortic atherosclerosis. Status post aortobiiliac stent endograft repair. No enlarged abdominal or pelvic lymph nodes. Reproductive: No mass or other abnormality. Other: No abdominal wall hernia or abnormality. No abdominopelvic ascites. Musculoskeletal: No acute or significant osseous findings. IMPRESSION: 1. Redemonstrated postoperative findings of left lower lobectomy and posterior left upper lobe wedge resection. 2. Irregular nodular opacities seen on prior examination are diminished or resolved compared to prior examination, most consistent with treatment response of pulmonary metastatic disease. Resolving infection or inflammation remain differential considerations. 3. No noncontrast evidence of new metastatic disease in the chest, abdomen, or pelvis. 4. Unchanged, mildly expansile sclerotic lesion of the spinous process of T4, of uncertain significance. No additional findings to suggest osseous metastatic disease. 5. 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 Recommend annual imaging followup by CTA or MRA. This recommendation follows 2010 ACCF/AHA/AATS/ACR/ASA/SCA/SCAI/SIR/STS/SVM Guidelines for the Diagnosis and Management of Patients with Thoracic Aortic Disease. Circulation. 2010; 121: Q034-V425. Aortic aneurysm NOS (ICD10-I71.9) 6. Emphysema and diffuse bilateral bronchial wall thickening. 7. Status post aortobiiliac stent endograft repair. Aortic Atherosclerosis (ICD10-I70.0) and Emphysema (ICD10-J43.9). Electronically Signed   By: Eddie Candle M.D.   On: 12/28/2020 11:01   CT Chest Wo Contrast  Result Date: 12/28/2020 CLINICAL DATA:  Primary Cancer Type: Lung Imaging Indication: Assess response to  therapy Interval therapy since last imaging? Yes Initial Cancer Diagnosis Date: 03/29/2016; Established by: Biopsy-proven Detailed Pathology: Stage Ib non-small cell lung cancer, adenocarcinoma. Primary Tumor location:   Left lower lobe. Recurrence? Yes; Date(s) of recurrence: 12/02/2017; Established by: Biopsy-proven; metastasis to T4. Surgeries: Left lower lobectomy and left upper lobe wedge resection, with stapling of apical bleb, 05/11/2016. AAA stent graft 2017. Chemotherapy: Yes; Ongoing? No; Most  recent administration: 06/30/2020 Immunotherapy?  Yes; Type: Keytruda; Ongoing? No Radiation therapy? No Other Cancer Therapies: Lumakras daily. EXAM: CT CHEST, ABDOMEN AND PELVIS WITHOUT CONTRAST TECHNIQUE: Multidetector CT imaging of the chest, abdomen and pelvis was performed following the standard protocol without IV contrast. COMPARISON:  Most recent CT chest, abdomen and pelvis 10/17/2020. 11/12/2017 PET-CT. FINDINGS: CT CHEST FINDINGS Cardiovascular: Right chest port catheter. Aortic atherosclerosis. 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 Normal heart size. No pericardial effusion. Mediastinum/Nodes: No enlarged mediastinal, hilar, or axillary lymph nodes. Small hiatal hernia. Thyroid gland, trachea, and esophagus demonstrate no significant findings. Lungs/Pleura: Moderate centrilobular and paraseptal emphysema. Redemonstrated postoperative findings of left lower lobectomy and posterior left upper lobe wedge resection. Irregular nodular opacities seen on prior examination are diminished or resolved compared to prior examination, an index nodule of the anterior right upper lobe measuring no greater than 0.5 x 0.3 cm, previously 0.8 x 0.6 cm (series 6, image 77) and an index nodule of the peripheral left upper lobe measuring 0.7 x 0 point 4 cm, previously 1.0 x 0.5 cm (series 6, image 75). Pleural thickening and calcification of the dependent right pleura.  Trace, chronic, loculated left pleural effusion. Musculoskeletal: No chest wall mass. Unchanged, mildly expansile sclerotic lesion of the spinous process of T4 (series 6, image 45). CT ABDOMEN PELVIS FINDINGS Hepatobiliary: No solid liver abnormality is seen. No gallstones, gallbladder wall thickening, or biliary dilatation. Pancreas: Unremarkable. No pancreatic ductal dilatation or surrounding inflammatory changes. Spleen: Normal in size without significant abnormality. Adrenals/Urinary Tract: Adrenal glands are unremarkable. Kidneys are normal, without renal calculi, solid lesion, or hydronephrosis. Bladder is unremarkable. Stomach/Bowel: Stomach is within normal limits. Appendix appears normal. No evidence of bowel wall thickening, distention, or inflammatory changes. Vascular/Lymphatic: Aortic atherosclerosis. Status post aortobiiliac stent endograft repair. No enlarged abdominal or pelvic lymph nodes. Reproductive: No mass or other abnormality. Other: No abdominal wall hernia or abnormality. No abdominopelvic ascites. Musculoskeletal: No acute or significant osseous findings. IMPRESSION: 1. Redemonstrated postoperative findings of left lower lobectomy and posterior left upper lobe wedge resection. 2. Irregular nodular opacities seen on prior examination are diminished or resolved compared to prior examination, most consistent with treatment response of pulmonary metastatic disease. Resolving infection or inflammation remain differential considerations. 3. No noncontrast evidence of new metastatic disease in the chest, abdomen, or pelvis. 4. Unchanged, mildly expansile sclerotic lesion of the spinous process of T4, of uncertain significance. No additional findings to suggest osseous metastatic disease. 5. 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 Recommend annual imaging followup by CTA or MRA. This recommendation follows 2010  ACCF/AHA/AATS/ACR/ASA/SCA/SCAI/SIR/STS/SVM Guidelines for the Diagnosis and Management of Patients with Thoracic Aortic Disease. Circulation. 2010; 121: A213-Y865. Aortic aneurysm NOS (ICD10-I71.9) 6. Emphysema and diffuse bilateral bronchial wall thickening. 7. Status post aortobiiliac stent endograft repair. Aortic Atherosclerosis (ICD10-I70.0) and Emphysema (ICD10-J43.9). Electronically Signed   By: Eddie Candle M.D.   On: 12/28/2020 11:01   DG Chest Port 1 View  Result Date: 12/28/2020 CLINICAL DATA:  65 year old male with history of shortness of breath. History of left lower lobe lung cancer undergoing treatment. EXAM: PORTABLE CHEST 1 VIEW COMPARISON:  Chest x-ray 11/29/2016. FINDINGS: Right-sided internal jugular single-lumen porta cath with tip terminating at the superior cavoatrial junction. Status post left lower lobectomy with compensatory hyperexpansion of the left upper lobe. Lung volumes are normal. No consolidative airspace disease. No pleural effusions.  No pneumothorax. No pulmonary nodule or mass noted. Pulmonary vasculature and the cardiomediastinal silhouette are within normal limits. Atherosclerosis in the thoracic aorta. IMPRESSION: 1. No radiographic evidence of acute cardiopulmonary disease. 2. Emphysema. 3. Status post left lower lobectomy. 4. Aortic atherosclerosis. Electronically Signed   By: Vinnie Langton M.D.   On: 12/28/2020 14:49    Procedures Procedures  CRITICAL CARE Performed by: Charlesetta Shanks   Total critical care time: 30 minutes  Critical care time was exclusive of separately billable procedures and treating other patients.  Critical care was necessary to treat or prevent imminent or life-threatening deterioration.  Critical care was time spent personally by me on the following activities: development of treatment plan with patient and/or surrogate as well as nursing, discussions with consultants, evaluation of patient's response to treatment, examination of  patient, obtaining history from patient or surrogate, ordering and performing treatments and interventions, ordering and review of laboratory studies, ordering and review of radiographic studies, pulse oximetry and re-evaluation of patient's condition.  Medications Ordered in ED Medications  sodium chloride 0.9 % bolus 1,000 mL (1,000 mLs Intravenous New Bag/Given 12/28/20 1511)  acetaminophen (TYLENOL) tablet 1,000 mg (1,000 mg Oral Given 12/28/20 1609)  sodium chloride 0.9 % bolus 1,000 mL (1,000 mLs Intravenous New Bag/Given 12/28/20 1609)    ED Course  I have reviewed the triage vital signs and the nursing notes.  Pertinent labs & imaging results that were available during my care of the patient were reviewed by me and considered in my medical decision making (see chart for details).    MDM Rules/Calculators/A&P                          Consult: Reviewed with cardiology, Dr. [place].  EKG is examined and he does not feel that today's EKG represents ACS.  More consistent with early repolarization.  Patient has had catheterization within the past year without any significant occlusive disease or stents.  Recommends trending troponins and continuing other work-up for alternate etiologies for hypertension tachycardia and fever.  Patient presents with hypotension and tachycardia.  Also low-grade fever.  Patient had CT scan earlier today and assume allergic reaction.  However no skin flushing or itching, no oral swelling or difficulty breathing.  Only symptom was acute onset of diarrhea and general weakness.  At this time symptoms are not conclusive for allergic reaction.  Patient has low-grade fever and tachycardia with hypotension.  Consideration for SIRS with unclear etiology.  Urinalysis pending due to machine malfunction.  Patient does have leukocytosis and AKI.  Rehydration initiated with 2 L normal saline.  Patient's mental status and respiratory status remained stable. Final Clinical Impression(s)  / ED Diagnoses Final diagnoses:  Hypotension, unspecified hypotension type  Dehydration  Fever, unspecified fever cause  Troponin I above reference range    Rx / DC Orders ED Discharge Orders     None        Charlesetta Shanks, MD 12/28/20 [phone]

## 2020-12-28 NOTE — ED Triage Notes (Signed)
Pt from home via [facility] for diarrhea, suspected cause dye for a scan this morning. Hx lung cancer. Some SHOB at baseline, lung sounds clear. No other complaints. Denies NV.  BP 100/60 HR 86 RR 18 SpO2 98% 4L [place]

## 2020-12-28 NOTE — ED Notes (Signed)
Admitting provider at bedside.

## 2020-12-28 NOTE — H&P (Addendum)
History and Physical    Ericson Nafziger GMW:[id number] DOB: 06/22/1955 DOA: 12/28/2020  PCP: [name], MD Patient coming from: Home  Chief Complaint: Shortness of breath  HPI: [name] is a 65 y.o. male with medical history significant of lung cancer non-small cell lung cancer adenocarcinoma status post left lower lobectomy as well as wedge resection of the left upper lobe in 2018 on adjuvant chemotherapy last chemo 3 months ago, hypertension had a surveillance CT scan done today after p.o. contrast.  He had CT of the chest abdomen and pelvis.  After the CT scan he went home and then he started having diarrhea with generalized weakness and shortness of breath.  He denies any chest pain syncopal episodes.  He was on lisinopril and carvedilol at home which she continued to take.  His blood pressure on arrival to ER was 85/61.  He denied any nausea vomiting abdominal pain chest pain.  He denied any urinary complaints.  He continued to have diarrhea even while in the ER.  He denies use of antibiotics recently. 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 Irregular nodular opacities seen on prior examination are diminished or resolved compared to prior examination, most consistent with treatment response of pulmonary metastatic disease. Resolving infection or inflammation remain differential considerations.No noncontrast evidence of new metastatic disease in the chest, abdomen, or pelvis. Unchanged, mildly expansile sclerotic lesion of the spinous process of T4, of uncertain significance. No additional findings to suggest osseous metastatic disease. 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 Recommend annual imaging followup by CTA or MRA. This recommendation follows 2010 ACCF/AHA/AATS/ACR/ASA/SCA/SCAI/SIR/STS/SVM Guidelines for the Diagnosis and Management of Patients with Thoracic Aortic Disease. Circulation. 2010; 121: Y403-K742. Aortic aneurysm NOS (ICD10-I71.9) Emphysema and diffuse bilateral bronchial wall thickening. Status post aortobiiliac stent endograft repair.  ED Course: Received 2 L of normal saline.  ED physician discussed with cardiologist on-call recommended to trend troponins.  Review of Systems: As per HPI otherwise all other systems reviewed and are negative  Ambulatory Status: Ambulatory at baseline  Past Medical History:  Diagnosis Date   AAA (abdominal aortic aneurysm) ([place])    AAA (abdominal aortic aneurysm) without rupture ([place]) 02/04/2014   Cancer ([place])    lung   Encounter for antineoplastic chemotherapy 06/06/2016   History of hepatitis B    HNP (herniated nucleus pulposus), lumbar    L4 with radiculopathy   Hypertension    Lung cancer ([place]) 05/18/2016   Malignant neoplasm of lower lobe of left lung ([place]) 04/05/2016   Mass of left lung    Mitral insufficiency 04/06/2016   This patient will eventually need MV repair if his prognosis is good from oncology standpoint. However, his lung cancer therapy  is the priority right now. His cardiac condition won't preclude possible lung surgery.  Once his lung cancer is under control he will need a TEE to further evaluate his mitral valve anatomy and MR severity, and also have ischemic workup as tere is evidence on calcificati   Renal insufficiency 10/09/2016   S/P partial lobectomy of lung 05/11/2016   Varicose veins of legs     Past Surgical History:  Procedure Laterality  Date   ABDOMINAL AORTIC ENDOVASCULAR STENT GRAFT N/A 03/12/2016   Procedure: ABDOMINAL AORTIC ENDOVASCULAR STENT GRAFT;  Surgeon: Conrad [place], MD;  Location: [facility];   Service: Vascular;  Laterality: N/A;   COLONOSCOPY     INGUINAL HERNIA REPAIR Left 1975   Left inguinal hernia   INGUINAL HERNIA REPAIR Right    IR IMAGING GUIDED PORT INSERTION  09/04/2019   LOBECTOMY Left 05/11/2016   Procedure: LEFT LOWER LOBE LOBECTOMY AND LEFT UPPER LOBE  RESECTION AND PLACEMENT OF ON-Q;  Surgeon: [name], MD;  Location: [facility];  Service: Thoracic;  Laterality: Left;   LUMBAR LAMINECTOMY  October 22, 2012   LYMPH NODE DISSECTION Left 05/11/2016   Procedure: LYMPH NODE DISSECTION;  Surgeon: [name], MD;  Location: [facility];  Service: Thoracic;  Laterality: Left;   RIGHT/LEFT HEART CATH AND CORONARY ANGIOGRAPHY N/A 03/10/2020   Procedure: RIGHT/LEFT HEART CATH AND CORONARY ANGIOGRAPHY;  Surgeon: Sherren Mocha, MD;  Location: [facility];  Service: Cardiovascular;  Laterality: N/A;   STAPLING OF BLEBS Left 05/11/2016   Procedure: STAPLING OF APICAL BLEB;  Surgeon: [name], MD;  Location: [facility];  Service: Thoracic;  Laterality: Left;   TEE WITHOUT CARDIOVERSION N/A 10/07/2019   Procedure: TRANSESOPHAGEAL ECHOCARDIOGRAM (TEE);  Surgeon: Dorothy Spark, MD;  Location: [facility] ENDOSCOPY;  Service: Cardiovascular;  Laterality: N/A;   VIDEO ASSISTED THORACOSCOPY Left 05/18/2016   Procedure: VIDEO ASSISTED THORACOSCOPY WITH REMOVAL OF LEFT APICAL BLEB;  Surgeon: [name], MD;  Location: [facility];  Service: Thoracic;  Laterality: Left;   VIDEO ASSISTED THORACOSCOPY (VATS)/WEDGE RESECTION Left 05/11/2016   Procedure: LEFT VIDEO ASSISTED THORACOSCOPY (VATS);  Surgeon: [name], MD;  Location: [facility] OR;  Service: Thoracic;  Laterality: Left;   VIDEO BRONCHOSCOPY N/A 05/11/2016   Procedure: VIDEO BRONCHOSCOPY, LEFT LUNG;  Surgeon: [name], MD;  Location: [facility] OR;  Service: Thoracic;  Laterality: N/A;   VIDEO BRONCHOSCOPY N/A 05/18/2016   Procedure: VIDEO BRONCHOSCOPY WITH BRONCHIAL WASHING;  Surgeon: [name], MD;  Location: [facility];   Service: Thoracic;  Laterality: N/A;    Social History   Socioeconomic History   Marital status: Married    Spouse name: Not on file   Number of children: Not on file   Years of education: Not on file   Highest education level: Not on file  Occupational History   Not on file  Tobacco Use   Smoking status: Former    Packs/day: 1.00    Types: Cigarettes    Quit date: 03/05/2016    Years since quitting: 4.8   Smokeless tobacco: Never  Vaping Use   Vaping Use: Never used  Substance and Sexual Activity   Alcohol use: Yes    Alcohol/week: 6.0 standard drinks    Types: 6 Cans of beer per week   Drug use: Yes    Types: Cocaine, Heroin    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 Authorizing Provider  carvedilol (COREG) 6.25 MG tablet Take 1 tablet (6.25 mg total) by mouth 2 (two) times daily. 10/31/20   Sherren Mocha, MD  lisinopril (ZESTRIL) 2.5 MG tablet TAKE 1 TABLET BY MOUTH EVERY DAY 02/24/20   Dorothy Spark, MD  predniSONE (DELTASONE) 10 MG tablet 6 tablets p.o. daily for 1 week followed by 4 tablet p.o. daily for 1 week followed by 2 tablet p.o. daily for 1 week followed by 1 tablet p.o. daily for 1 week then stop. 12/05/20   Curt Bears, MD  sotorasib (LUMAKRAS) 120 MG TABS Take 8 tablets (960 mg) by mouth daily. Patient not taking: Reported on 12/13/2020 10/28/20   Curt Bears, MD    Physical Exam: Vitals:   12/28/20 1500 12/28/20 1600 12/28/20 1615 12/28/20 1630  BP: (!) 85/61 (!) 89/58 (!) 96/53 (!) 80/53  Pulse: (!) 112 95 93 90  Resp: (!) 30 (!) 26 (!) 29 (!) 21  Temp:      TempSrc:      SpO2: 96% 96% 100% 99%  Weight:      Height:         General:  Appears calm and comfortable Eyes:  PERRL, EOMI, normal lids, iris ENT:  grossly normal hearing, lips & tongue, mmm Neck:  no LAD, masses or thyromegaly Cardiovascular:  RRR, no m/r/g. No LE edema.  Respiratory:  diminished breath sounds bilaterally, no w/r/r. tachypneic Abdomen:  soft, ntnd, NABS Skin:  no rash or induration seen on limited exam Musculoskeletal:  grossly normal tone BUE/BLE, good ROM, no bony abnormality Psychiatric:  grossly normal mood and affect, speech fluent and appropriate, AOx3 Neurologic:  CN 2-12 grossly intact, moves all extremities in coordinated fashion, sensation intact  Labs on Admission: I have personally reviewed following labs and imaging studies  CBC: Recent Labs  Lab 12/27/20 1056 12/28/20 1512  WBC 12.0* 16.0*  NEUTROABS 9.5* 14.0*  HGB 13.0 14.4  HCT 39.6 45.5  MCV 89.8 91.7  PLT 223 366   Basic Metabolic Panel: Recent Labs  Lab 12/27/20 1056 12/28/20 1512  NA 138 138  K 4.1 4.6  CL 106 110  CO2 21* 21*  GLUCOSE 76 86  BUN 27* 39*  CREATININE 1.82* 2.43*  CALCIUM 8.9 9.6   GFR: Estimated Creatinine Clearance: 27.1 mL/min (A) (by C-G formula based on SCr of 2.43 mg/dL (H)). Liver Function Tests: Recent Labs  Lab 12/27/20 1056 12/28/20 1512  AST 73* 69*  ALT 121* 111*  ALKPHOS 295* 289*  BILITOT 0.9 1.3*  PROT 6.5 7.2  ALBUMIN 2.9* 3.4*   Recent Labs  Lab 12/28/20 1512  LIPASE 84*   No results for input(s): AMMONIA in the last 168 hours. Coagulation Profile: Recent Labs  Lab 12/28/20 1512  INR 1.0   Cardiac Enzymes: No results for input(s): CKTOTAL, CKMB, CKMBINDEX, TROPONINI in the last 168 hours. BNP (last 3  results) No results for input(s): PROBNP in the last 8760 hours. HbA1C: No results for input(s): HGBA1C in the last 72 hours. CBG: No results for input(s): GLUCAP in the last 168 hours. Lipid Profile: No results for input(s): CHOL, HDL, LDLCALC, TRIG, CHOLHDL, LDLDIRECT in the last 72 hours. Thyroid Function Tests: Recent Labs    12/27/20 1056  TSH 1.161   Anemia Panel: No results for input(s):  VITAMINB12, FOLATE, FERRITIN, TIBC, IRON, RETICCTPCT in the last 72 hours. Urine analysis:    Component Value Date/Time   COLORURINE YELLOW 05/17/2016 1153   APPEARANCEUR HAZY (A) 05/17/2016 1153   LABSPEC 1.017 05/17/2016 1153   PHURINE 6.0 05/17/2016 1153   GLUCOSEU NEGATIVE 05/17/2016 1153   HGBUR SMALL (A) 05/17/2016 1153   BILIRUBINUR NEGATIVE 05/17/2016 1153   KETONESUR NEGATIVE 05/17/2016 1153   PROTEINUR NEGATIVE 05/17/2016 1153   NITRITE POSITIVE (A) 05/17/2016 1153   LEUKOCYTESUR LARGE (A) 05/17/2016 1153    Creatinine Clearance: Estimated Creatinine Clearance: 27.1 mL/min (A) (by C-G formula based on SCr of 2.43 mg/dL (H)).  Sepsis Labs: @LABRCNTIP (procalcitonin:4,lacticidven:4) ) Recent Results (from the past 240 hour(s))  Resp Panel by RT-PCR (Flu A&B, Covid) Nasopharyngeal Swab     Status: None   Collection Time: 12/28/20  3:31 PM   Specimen: Nasopharyngeal Swab; Nasopharyngeal(NP) swabs in vial transport medium  Result Value Ref Range Status   SARS Coronavirus 2 by RT PCR NEGATIVE NEGATIVE Final    Comment: (NOTE) SARS-CoV-2 target nucleic acids are NOT DETECTED.  The SARS-CoV-2 RNA is generally detectable in upper respiratory specimens during the acute phase of infection. The lowest concentration of SARS-CoV-2 viral copies this assay can detect is 138 copies/mL. A negative result does not preclude SARS-Cov-2 infection and should not be used as the sole basis for treatment or other patient management decisions. A negative result may occur with  improper  specimen collection/handling, submission of specimen other than nasopharyngeal swab, presence of viral mutation(s) within the areas targeted by this assay, and inadequate number of viral copies(<138 copies/mL). A negative result must be combined with clinical observations, patient history, and epidemiological information. The expected result is Negative.  Fact Sheet for Patients:  EntrepreneurPulse.com.au  Fact Sheet for Healthcare Providers:  IncredibleEmployment.be  This test is no t yet approved or cleared by the [place] FDA and  has been authorized for detection and/or diagnosis of SARS-CoV-2 by FDA under an Emergency Use Authorization (EUA). This EUA will remain  in effect (meaning this test can be used) for the duration of the COVID-19 declaration under Section 564(b)(1) of the Act, 21 U.S.C.section 360bbb-3(b)(1), unless the authorization is terminated  or revoked sooner.       Influenza A by PCR NEGATIVE NEGATIVE Final   Influenza B by PCR NEGATIVE NEGATIVE Final    Comment: (NOTE) The Xpert Xpress SARS-CoV-2/FLU/RSV plus assay is intended as an aid in the diagnosis of influenza from Nasopharyngeal swab specimens and should not be used as a sole basis for treatment. Nasal washings and aspirates are unacceptable for Xpert Xpress SARS-CoV-2/FLU/RSV testing.  Fact Sheet for Patients: EntrepreneurPulse.com.au  Fact Sheet for Healthcare Providers: IncredibleEmployment.be  This test is not yet approved or cleared by the [place] FDA and has been authorized for detection and/or diagnosis of SARS-CoV-2 by FDA under an Emergency Use Authorization (EUA). This EUA will remain in effect (meaning this test can be used) for the duration of the COVID-19 declaration under Section 564(b)(1) of the Act, 21 U.S.C. section 360bbb-3(b)(1), unless the authorization is terminated or revoked.  Performed at  [facility], [place] [address], [place], [place] [address]      Radiological Exams on Admission: CT Abdomen Pelvis Wo Contrast  Result Date: 12/28/2020 CLINICAL DATA:  Primary Cancer Type: Lung Imaging Indication: Assess response to therapy Interval therapy since last imaging? Yes Initial Cancer Diagnosis Date: 03/29/2016; Established by: Biopsy-proven Detailed Pathology: Stage Ib non-small cell lung  cancer, adenocarcinoma. Primary Tumor location:   Left lower lobe. Recurrence? Yes; Date(s) of recurrence: 12/02/2017; Established by: Biopsy-proven; metastasis to T4. Surgeries: Left lower lobectomy and left upper lobe wedge resection, with stapling of apical bleb, 05/11/2016. AAA stent graft 2017. Chemotherapy: Yes; Ongoing? No; Most recent administration: 06/30/2020 Immunotherapy?  Yes; Type: Keytruda; Ongoing? No Radiation therapy? No Other Cancer Therapies: Lumakras daily. EXAM: CT CHEST, ABDOMEN AND PELVIS WITHOUT CONTRAST TECHNIQUE: Multidetector CT imaging of the chest, abdomen and pelvis was performed following the standard protocol without IV contrast. COMPARISON:  Most recent CT chest, abdomen and pelvis 10/17/2020. 11/12/2017 PET-CT. FINDINGS: CT CHEST FINDINGS Cardiovascular: Right chest port catheter. Aortic atherosclerosis. 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 Normal heart size. No pericardial effusion. Mediastinum/Nodes: No enlarged mediastinal, hilar, or axillary lymph nodes. Small hiatal hernia. Thyroid gland, trachea, and esophagus demonstrate no significant findings. Lungs/Pleura: Moderate centrilobular and paraseptal emphysema. Redemonstrated postoperative findings of left lower lobectomy and posterior left upper lobe wedge resection. Irregular nodular opacities seen on prior examination are diminished or resolved compared to prior examination, an index nodule of the anterior right upper lobe measuring no greater than 0.5  x 0.3 cm, previously 0.8 x 0.6 cm (series 6, image 77) and an index nodule of the peripheral left upper lobe measuring 0.7 x 0 point 4 cm, previously 1.0 x 0.5 cm (series 6, image 75). Pleural thickening and calcification of the dependent right pleura. Trace, chronic, loculated left pleural effusion. Musculoskeletal: No chest wall mass. Unchanged, mildly expansile sclerotic lesion of the spinous process of T4 (series 6, image 45). CT ABDOMEN PELVIS FINDINGS Hepatobiliary: No solid liver abnormality is seen. No gallstones, gallbladder wall thickening, or biliary dilatation. Pancreas: Unremarkable. No pancreatic ductal dilatation or surrounding inflammatory changes. Spleen: Normal in size without significant abnormality. Adrenals/Urinary Tract: Adrenal glands are unremarkable. Kidneys are normal, without renal calculi, solid lesion, or hydronephrosis. Bladder is unremarkable. Stomach/Bowel: Stomach is within normal limits. Appendix appears normal. No evidence of bowel wall thickening, distention, or inflammatory changes. Vascular/Lymphatic: Aortic atherosclerosis. Status post aortobiiliac stent endograft repair. No enlarged abdominal or pelvic lymph nodes. Reproductive: No mass or other abnormality. Other: No abdominal wall hernia or abnormality. No abdominopelvic ascites. Musculoskeletal: No acute or significant osseous findings. IMPRESSION: 1. Redemonstrated postoperative findings of left lower lobectomy and posterior left upper lobe wedge resection. 2. Irregular nodular opacities seen on prior examination are diminished or resolved compared to prior examination, most consistent with treatment response of pulmonary metastatic disease. Resolving infection or inflammation remain differential considerations. 3. No noncontrast evidence of new metastatic disease in the chest, abdomen, or pelvis. 4. Unchanged, mildly expansile sclerotic lesion of the spinous process of T4, of uncertain significance. No additional findings  to suggest osseous metastatic disease. 5. 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 Recommend annual imaging followup by CTA or MRA. This recommendation follows 2010 ACCF/AHA/AATS/ACR/ASA/SCA/SCAI/SIR/STS/SVM Guidelines for the Diagnosis and Management of Patients with Thoracic Aortic Disease. Circulation. 2010; 121: Z660-Y301. Aortic aneurysm NOS (ICD10-I71.9) 6. Emphysema and diffuse bilateral bronchial wall thickening. 7. Status post aortobiiliac stent endograft repair. Aortic Atherosclerosis (ICD10-I70.0) and Emphysema (ICD10-J43.9). Electronically Signed   By: Eddie Candle M.D.   On: 12/28/2020 11:01   CT Chest Wo Contrast  Result Date: 12/28/2020 CLINICAL DATA:  Primary Cancer Type: Lung Imaging Indication: Assess response to therapy Interval therapy since last imaging? Yes Initial Cancer Diagnosis Date:  03/29/2016; Established by: Biopsy-proven Detailed Pathology: Stage Ib non-small cell lung cancer, adenocarcinoma. Primary Tumor location:   Left lower lobe. Recurrence? Yes; Date(s) of recurrence: 12/02/2017; Established by: Biopsy-proven; metastasis to T4. Surgeries: Left lower lobectomy and left upper lobe wedge resection, with stapling of apical bleb, 05/11/2016. AAA stent graft 2017. Chemotherapy: Yes; Ongoing? No; Most recent administration: 06/30/2020 Immunotherapy?  Yes; Type: Keytruda; Ongoing? No Radiation therapy? No Other Cancer Therapies: Lumakras daily. EXAM: CT CHEST, ABDOMEN AND PELVIS WITHOUT CONTRAST TECHNIQUE: Multidetector CT imaging of the chest, abdomen and pelvis was performed following the standard protocol without IV contrast. COMPARISON:  Most recent CT chest, abdomen and pelvis 10/17/2020. 11/12/2017 PET-CT. FINDINGS: CT CHEST FINDINGS Cardiovascular: Right chest port catheter. Aortic atherosclerosis. 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 Normal heart  size. No pericardial effusion. Mediastinum/Nodes: No enlarged mediastinal, hilar, or axillary lymph nodes. Small hiatal hernia. Thyroid gland, trachea, and esophagus demonstrate no significant findings. Lungs/Pleura: Moderate centrilobular and paraseptal emphysema. Redemonstrated postoperative findings of left lower lobectomy and posterior left upper lobe wedge resection. Irregular nodular opacities seen on prior examination are diminished or resolved compared to prior examination, an index nodule of the anterior right upper lobe measuring no greater than 0.5 x 0.3 cm, previously 0.8 x 0.6 cm (series 6, image 77) and an index nodule of the peripheral left upper lobe measuring 0.7 x 0 point 4 cm, previously 1.0 x 0.5 cm (series 6, image 75). Pleural thickening and calcification of the dependent right pleura. Trace, chronic, loculated left pleural effusion. Musculoskeletal: No chest wall mass. Unchanged, mildly expansile sclerotic lesion of the spinous process of T4 (series 6, image 45). CT ABDOMEN PELVIS FINDINGS Hepatobiliary: No solid liver abnormality is seen. No gallstones, gallbladder wall thickening, or biliary dilatation. Pancreas: Unremarkable. No pancreatic ductal dilatation or surrounding inflammatory changes. Spleen: Normal in size without significant abnormality. Adrenals/Urinary Tract: Adrenal glands are unremarkable. Kidneys are normal, without renal calculi, solid lesion, or hydronephrosis. Bladder is unremarkable. Stomach/Bowel: Stomach is within normal limits. Appendix appears normal. No evidence of bowel wall thickening, distention, or inflammatory changes. Vascular/Lymphatic: Aortic atherosclerosis. Status post aortobiiliac stent endograft repair. No enlarged abdominal or pelvic lymph nodes. Reproductive: No mass or other abnormality. Other: No abdominal wall hernia or abnormality. No abdominopelvic ascites. Musculoskeletal: No acute or significant osseous findings. IMPRESSION: 1. Redemonstrated  postoperative findings of left lower lobectomy and posterior left upper lobe wedge resection. 2. Irregular nodular opacities seen on prior examination are diminished or resolved compared to prior examination, most consistent with treatment response of pulmonary metastatic disease. Resolving infection or inflammation remain differential considerations. 3. No noncontrast evidence of new metastatic disease in the chest, abdomen, or pelvis. 4. Unchanged, mildly expansile sclerotic lesion of the spinous process of T4, of uncertain significance. No additional findings to suggest osseous metastatic disease. 5. 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 Recommend annual imaging followup by CTA or MRA. This recommendation follows 2010 ACCF/AHA/AATS/ACR/ASA/SCA/SCAI/SIR/STS/SVM Guidelines for the Diagnosis and Management of Patients with Thoracic Aortic Disease. Circulation. 2010; 121: N562-Z308. Aortic aneurysm NOS (ICD10-I71.9) 6. Emphysema and diffuse bilateral bronchial wall thickening. 7. Status post aortobiiliac stent endograft repair. Aortic Atherosclerosis (ICD10-I70.0) and Emphysema (ICD10-J43.9). Electronically Signed   By: Eddie Candle M.D.   On: 12/28/2020 11:01   DG Chest Port 1 View  Result Date: 12/28/2020 CLINICAL DATA:  65 year old male with history of shortness of breath.  History of left lower lobe lung cancer undergoing treatment. EXAM: PORTABLE CHEST 1 VIEW COMPARISON:  Chest x-ray 11/29/2016. FINDINGS: Right-sided internal jugular single-lumen porta cath with tip terminating at the superior cavoatrial junction. Status post left lower lobectomy with compensatory hyperexpansion of the left upper lobe. Lung volumes are normal. No consolidative airspace disease. No pleural effusions. No pneumothorax. No pulmonary nodule or mass noted. Pulmonary vasculature and the cardiomediastinal silhouette are within normal limits. Atherosclerosis  in the thoracic aorta. IMPRESSION: 1. No radiographic evidence of acute cardiopulmonary disease. 2. Emphysema. 3. Status post left lower lobectomy. 4. Aortic atherosclerosis. Electronically Signed   By: Vinnie Langton M.D.   On: 12/28/2020 14:49    EKG: Sinus tachycardia no acute findings  Assessment/Plan Active Problems:   SOB (shortness of breath)   #1 sepsis present on admission unclear source patient presented with fever 100.5 with hypotension 80/53 tachycardia heart rate in the 120s tachypneic between 21-28 and hypoxic was placed on 3 L of oxygen with endorgan damage such as AKI. Check procalcitonin, he does not have lactic acidosis.  Check MRSA PCR. Cover him with cefepime Follow-up blood cultures, UA not done yet since the machine in the ER apparently is not working  #2 hypotension likely related to dehydration/sepsis continue IV fluid hydration with normal saline continue to hold Coreg and lisinopril  #3 history of non-small cell lung CA followed by Dr. Julien Nordmann  #4 AKI on CKD stage IIIb creatinine on admission is 2.43 up from 1.82 YESTERDAY  #5 elevated lipase denies any abdominal tenderness nausea vomiting  #6 diarrhea likely contrast related low suspicion for C. difficile.  Will follow closely.  #7  Elevated LFTs monitor  #8 hypoalbuminemia/malnutrition-we will order supplements  #9 elevated troponin 75 likely related to demand ischemia we will trend.  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  Will reorder echo this admission.    Estimated body mass index is 17.87 kg/m as calculated from the following:   Height as of this encounter: 6\' 3"  (1.905 m).   Weight as of this encounter: 64.9 kg.   DVT prophylaxis: Heparin Code Status: Full code Family Communication: Discussed with wife at bedside Disposition Plan: Home Consults called: None Admission status:  Inpatient   Georgette Shell MD 12/28/2020, 5:20 PM

## 2020-12-28 NOTE — ED Notes (Signed)
MD Rodena Piety paged at this time regarding pts low BP.

## 2020-12-28 NOTE — Progress Notes (Signed)
Pharmacy Antibiotic Note  [name]. is a 65 y.o. male admitted on 12/28/2020 with sepsis.  Pharmacy has been consulted for cefepime dosing.  Plan: Cefepime 2 g IV every 24 hours Monitor clinical picture, renal function F/U C&S, abx deescalation / LOT   Height: 6\' 3"  (190.5 cm) Weight: 64.9 kg (143 lb) IBW/kg (Calculated) : 84.5  Temp (24hrs), Avg:100.5 F (38.1 C), Min:100.5 F (38.1 C), Max:100.5 F (38.1 C)  Recent Labs  Lab 12/27/20 1056 12/28/20 1512 12/28/20 1530  WBC 12.0* 16.0*  --   CREATININE 1.82* 2.43*  --   LATICACIDVEN  --   --  1.3    Estimated Creatinine Clearance: 27.1 mL/min (A) (by C-G formula based on SCr of 2.43 mg/dL (H)).    Allergies  Allergen Reactions   Tramadol Nausea And Vomiting    Antimicrobials this admission: 9/7 cefepime >>   Dose adjustments this admission:   Microbiology results: 9/7 BCx: sent 9/7 MRSA PCR: needs to be collected  Thank you for allowing pharmacy to be a part of this patient's care.  Suzzanne Cloud, PharmD, BCPS Clinical Pharmacist [place] Please utilize Amion for appropriate phone number to reach the unit pharmacist ([facility]) 12/28/2020 5:33 PM

## 2020-12-28 NOTE — Telephone Encounter (Signed)
?   Contrast reaction today.CT done at 0930  Wife left message that Donaldson was SOB for 20 minutes -felt some better.  However, when I returned her call she said he is now SOB again and "breathing shallow". Also experiencing diarrhea and had chills but resolved .  While on the phone wife said EMS was at their  house to transport to ED

## 2020-12-29 ENCOUNTER — Encounter (HOSPITAL_COMMUNITY): Payer: Self-pay | Admitting: Internal Medicine

## 2020-12-29 ENCOUNTER — Inpatient Hospital Stay: Payer: Federal, State, Local not specified - PPO | Admitting: Internal Medicine

## 2020-12-29 ENCOUNTER — Other Ambulatory Visit: Payer: Self-pay

## 2020-12-29 ENCOUNTER — Inpatient Hospital Stay (HOSPITAL_COMMUNITY): Payer: Federal, State, Local not specified - PPO

## 2020-12-29 DIAGNOSIS — R0603 Acute respiratory distress: Secondary | ICD-10-CM

## 2020-12-29 DIAGNOSIS — E861 Hypovolemia: Secondary | ICD-10-CM

## 2020-12-29 DIAGNOSIS — C3492 Malignant neoplasm of unspecified part of left bronchus or lung: Secondary | ICD-10-CM | POA: Diagnosis not present

## 2020-12-29 DIAGNOSIS — B182 Chronic viral hepatitis C: Secondary | ICD-10-CM | POA: Diagnosis not present

## 2020-12-29 DIAGNOSIS — I9589 Other hypotension: Secondary | ICD-10-CM

## 2020-12-29 DIAGNOSIS — R197 Diarrhea, unspecified: Secondary | ICD-10-CM

## 2020-12-29 DIAGNOSIS — Z21 Asymptomatic human immunodeficiency virus [HIV] infection status: Secondary | ICD-10-CM

## 2020-12-29 DIAGNOSIS — R509 Fever, unspecified: Secondary | ICD-10-CM

## 2020-12-29 DIAGNOSIS — K719 Toxic liver disease, unspecified: Secondary | ICD-10-CM | POA: Diagnosis not present

## 2020-12-29 LAB — PROCALCITONIN: Procalcitonin: 40.1 ng/mL

## 2020-12-29 LAB — ECHOCARDIOGRAM COMPLETE
Area-P 1/2: 4.68 cm2
Height: 75 in
Radius: 0.5 cm
S' Lateral: 2.9 cm
Weight: 2288 oz

## 2020-12-29 LAB — COMPREHENSIVE METABOLIC PANEL
ALT: 75 U/L — ABNORMAL HIGH (ref 0–44)
AST: 46 U/L — ABNORMAL HIGH (ref 15–41)
Albumin: 2.6 g/dL — ABNORMAL LOW (ref 3.5–5.0)
Alkaline Phosphatase: 202 U/L — ABNORMAL HIGH (ref 38–126)
Anion gap: 6 (ref 5–15)
BUN: 36 mg/dL — ABNORMAL HIGH (ref 8–23)
CO2: 19 mmol/L — ABNORMAL LOW (ref 22–32)
Calcium: 8.1 mg/dL — ABNORMAL LOW (ref 8.9–10.3)
Chloride: 115 mmol/L — ABNORMAL HIGH (ref 98–111)
Creatinine, Ser: 2.03 mg/dL — ABNORMAL HIGH (ref 0.61–1.24)
GFR, Estimated: 35 mL/min — ABNORMAL LOW (ref >60)
Glucose, Bld: 88 mg/dL (ref 70–99)
Potassium: 4.7 mmol/L (ref 3.5–5.1)
Sodium: 140 mmol/L (ref 135–145)
Total Bilirubin: 1.1 mg/dL (ref 0.3–1.2)
Total Protein: 5.5 g/dL — ABNORMAL LOW (ref 6.5–8.1)

## 2020-12-29 LAB — HIV ANTIBODY (ROUTINE TESTING W REFLEX): HIV Screen 4th Generation wRfx: REACTIVE — AB

## 2020-12-29 MED ORDER — ACETAMINOPHEN 325 MG PO TABS
650.0000 mg | ORAL_TABLET | Freq: Four times a day (QID) | ORAL | Status: DC | PRN
  Administered 2020-12-29: 650 mg via ORAL
  Filled 2020-12-29: qty 2

## 2020-12-29 MED ORDER — PREDNISONE 5 MG PO TABS: 10.0000 mg | ORAL_TABLET | Freq: Every day | ORAL | Status: DC

## 2020-12-29 MED ORDER — SODIUM CHLORIDE 0.9 % IV SOLN: 2.0000 g | Freq: Two times a day (BID) | INTRAVENOUS | Status: DC

## 2020-12-29 MED ORDER — ADULT MULTIVITAMIN W/MINERALS CH
1.0000 | ORAL_TABLET | Freq: Every day | ORAL | Status: DC
  Administered 2020-12-29 – 2020-12-30 (×2): 1 via ORAL
  Filled 2020-12-29 (×2): qty 1

## 2020-12-29 MED ORDER — ADULT MULTIVITAMIN W/MINERALS CH: 1.0000 | ORAL_TABLET | Freq: Every day | ORAL | Status: DC

## 2020-12-29 MED ORDER — SODIUM CHLORIDE 0.9% FLUSH
10.0000 mL | INTRAVENOUS | Status: DC | PRN
  Administered 2020-12-30: 10 mL

## 2020-12-29 MED ORDER — LOPERAMIDE HCL 2 MG PO CAPS
2.0000 mg | ORAL_CAPSULE | Freq: Four times a day (QID) | ORAL | Status: DC | PRN
  Administered 2020-12-29: 2 mg via ORAL
  Filled 2020-12-29: qty 1

## 2020-12-29 MED ORDER — FENTANYL CITRATE PF 50 MCG/ML IJ SOSY
12.5000 ug | PREFILLED_SYRINGE | Freq: Once | INTRAMUSCULAR | Status: AC
Start: 2020-12-29 — End: 2020-12-29
  Administered 2020-12-29: 12.5 ug via INTRAVENOUS
  Filled 2020-12-29: qty 1

## 2020-12-29 MED ORDER — SODIUM CHLORIDE 0.9% FLUSH
10.0000 mL | Freq: Two times a day (BID) | INTRAVENOUS | Status: DC
  Administered 2020-12-29 – 2020-12-30 (×2): 10 mL

## 2020-12-29 MED ORDER — ENSURE ENLIVE PO LIQD
237.0000 mL | Freq: Two times a day (BID) | ORAL | Status: DC
  Administered 2020-12-30: 237 mL via ORAL

## 2020-12-29 MED ORDER — PREDNISONE 20 MG PO TABS
20.0000 mg | ORAL_TABLET | Freq: Every day | ORAL | Status: DC
  Administered 2020-12-29 – 2020-12-30 (×2): 10 mg via ORAL
  Filled 2020-12-29 (×2): qty 1

## 2020-12-29 MED ORDER — CHLORHEXIDINE GLUCONATE CLOTH 2 % EX PADS
6.0000 | MEDICATED_PAD | Freq: Every day | CUTANEOUS | Status: DC
  Administered 2020-12-29 – 2020-12-30 (×2): 6 via TOPICAL

## 2020-12-29 NOTE — Plan of Care (Signed)
  Problem: Coping: Goal: Level of anxiety will decrease Outcome: Completed/Met

## 2020-12-29 NOTE — Progress Notes (Signed)
Initial Nutrition Assessment  DOCUMENTATION CODES:   Non-severe (moderate) malnutrition in context of chronic illness, Underweight  INTERVENTION:  - continue Ensure Enlive BID, each supplement provides 350 kcal and 20 grams of protein. - will order Magic Cup BID with meals, each supplement provides 290 kcal and 9 grams of protein. - will order 1 tablet multivitamin with minerals. - will communicate with [place] RD concerning patient.    NUTRITION DIAGNOSIS:   Moderate Malnutrition related to chronic illness, cancer and cancer related treatments as evidenced by moderate fat depletion, moderate muscle depletion.  GOAL:   Patient will meet greater than or equal to 90% of their needs  MONITOR:   PO intake, Supplement acceptance, Labs, Weight trends  REASON FOR ASSESSMENT:   Malnutrition Screening Tool  ASSESSMENT:   65 y.o. male with medical history of NSCLC adenocarcinoma s/p L lower lobectomy and wedge resection of the LUL in 2018 on adjuvant chemo (last: 3 months ago) and HTN. He presented to the ED due to having diarrhea following a CT abd/pelvis with contrast, generalized weakness, and shortness of breath.  Patient was assessed by [facility] RD on 8/30.   At the time of visit, patient laying in bed with 2 family members at bedside. He was looking at meal menu. He had not eaten breakfast. No abdominal pain or nausea and reported diarrhea was improving/resolving. He reports diarrhea began following CT with contrast PTA.   Patient gave lunch order to RD and meal was ordered for him after visit.   Patient confirms information from RD assessment on 8/30--he was trying to eat 3 meals + snacks daily. He continued to drink at least 1 bottle of Ensure Original/day. He denied any weakness or dizziness PTA; no difficulties with ambulating.   Noted LLE edema during NFPE. Patient reports this is usual for him and that it is not painful.   Weight yesterday was 143  lb and it appears that despite some fluctuations, weight has been fairly stable for the past 5 months with him weighing 145 lb on 07/28/20.   Labs reviewed; Cl: 115 mmol/l, BUN: 36 mg/dl, creatinine: 2.03 mg/dl, Ca: 8.1 mg/dl, Alk Phos elevated, LFTs elevated but trending down, GFR: 35 ml/min.  Medications reviewed; prednisone taper. IVF; NS @ 100 ml/hr.      NUTRITION - FOCUSED PHYSICAL EXAM:  Flowsheet Row Most Recent Value  Orbital Region Mild depletion  Upper Arm Region Moderate depletion  Thoracic and Lumbar Region Unable to assess  Buccal Region Moderate depletion  Temple Region Moderate depletion  Clavicle Bone Region Moderate depletion  Clavicle and Acromion Bone Region Severe depletion  Scapular Bone Region Severe depletion  Dorsal Hand Mild depletion  Patellar Region Severe depletion  Anterior Thigh Region Severe depletion  Posterior Calf Region Moderate depletion  Edema (RD Assessment) Mild  [LLE]  Hair Reviewed  Eyes Reviewed  Mouth Reviewed  Skin Reviewed  Nails Reviewed       Diet Order:   Diet Order             Diet Heart Room service appropriate? Yes; Fluid consistency: Thin  Diet effective now                   EDUCATION NEEDS:   Education needs have been addressed  Skin:  Skin Assessment: Reviewed RN Assessment  Last BM:  9/8 (type 5 x1)  Height:   Ht Readings from Last 1 Encounters:  12/28/20 _0  (1.905 m)  Weight:   Wt Readings from Last 1 Encounters:  12/28/20 64.9 kg     Estimated Nutritional Needs:  Kcal:  2637-8588 kcal Protein:  110-120 grams Fluid:  >/= 2.5 L/day     Jarome Matin, MS, RD, LDN, CNSC Inpatient Clinical Dietitian RD pager # available in [place]  After hours/weekend pager # available in [facility]

## 2020-12-29 NOTE — Progress Notes (Signed)
Pharmacy Antibiotic Note  [name]. is a 65 y.o. male admitted on 12/28/2020 with sepsis.  Pharmacy has been consulted for cefepime dosing.  Pt Scr has now improved from 2.43 to 2.03, CrCl is now 33 ml/min   Plan: Adjust cefepime dose to cefepime 2 gr IV q12h  Monitor clinical picture, renal function F/U C&S, abx deescalation / LOT   Height: 6\' 3"  (190.5 cm) Weight: 64.9 kg (143 lb) IBW/kg (Calculated) : 84.5  Temp (24hrs), Avg:99.1 F (37.3 C), Min:98.2 F (36.8 C), Max:100.5 F (38.1 C)  Recent Labs  Lab 12/27/20 1056 12/28/20 1512 12/28/20 1530 12/29/20 0437  WBC 12.0* 16.0*  --   --   CREATININE 1.82* 2.43*  --  2.03*  LATICACIDVEN  --   --  1.3  --      Estimated Creatinine Clearance: 32.4 mL/min (A) (by C-G formula based on SCr of 2.03 mg/dL (H)).    Allergies  Allergen Reactions   Tramadol Nausea And Vomiting    Antimicrobials this admission: 9/7 cefepime >>   Dose adjustments this admission:   Microbiology results: 9/7 BCx: NGTD  9/7 MRSA PCR: negative     Royetta Asal, PharmD, BCPS 12/29/2020 9:32 AM

## 2020-12-29 NOTE — Progress Notes (Signed)
PROGRESS NOTE    [name]  JOI:[id number] DOB: 07/08/1955 DOA: 12/28/2020 PCP: [name], MD  Brief Narrative:  William Kales [name] is a 65 y.o. male with medical history significant of lung cancer non-small cell lung cancer adenocarcinoma status post left lower lobectomy as well as wedge resection of the left upper lobe in 2018 on adjuvant chemotherapy last chemo 3 months ago, hypertension had a surveillance CT scan done today after p.o. contrast.  He had CT of the chest abdomen and pelvis.  After the CT scan he went home and then he started having diarrhea with generalized weakness and shortness of breath.  He denies any chest pain syncopal episodes.  He was on lisinopril and carvedilol at home which she continued to take.  His blood pressure on arrival to ER was 85/61.  He denied any nausea vomiting abdominal pain chest pain.  He denied any urinary complaints.  He continued to have diarrhea even while in the ER.  He denies use of antibiotics recently. 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 Irregular nodular opacities seen on prior examination are diminished or resolved compared to prior examination, most consistent with treatment response of pulmonary metastatic disease. Resolving infection or inflammation remain differential considerations.No noncontrast evidence of new metastatic disease in the chest, abdomen, or pelvis. Unchanged, mildly expansile sclerotic lesion of the spinous process of T4, of uncertain significance. No additional findings to suggest osseous metastatic disease. 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 Recommend annual imaging followup by CTA or MRA. This recommendation follows 2010 ACCF/AHA/AATS/ACR/ASA/SCA/SCAI/SIR/STS/SVM Guidelines for the Diagnosis and Management of Patients with Thoracic Aortic Disease. Circulation. 2010; 121: B583-E940. Aortic aneurysm NOS (ICD10-I71.9) Emphysema and diffuse bilateral bronchial wall thickening. Status post aortobiiliac stent endograft repair.   Assessment & Plan:   Active Problems:   SOB (shortness of breath)   #1 sepsis present on admission unclear source patient presented with fever 100.5 with hypotension 80/53 tachycardia heart rate in the 120s tachypneic between 21-28 and hypoxic was placed on 3 L of oxygen with endorgan damage such as AKI. Elevated procalcitonin, MRSA PCR negative.  Seen by ID today recommends to stop cefepime and watch him.  Follow-up cultures.   #2 hypotension likely related to dehydration/sepsis continue IV fluid hydration with normal saline continue to hold Coreg and lisinopril   #3 history of non-small cell lung CA followed by Dr. Julien Nordmann   #4 AKI on CKD stage IIIb creatinine 2.03 down from 2.43 with hydration.    #5 elevated lipase denies any abdominal tenderness nausea vomiting   #6 diarrhea likely contrast related low suspicion for C. difficile.  Will follow closely.   #7  Elevated LFTs monitor   #8 hypoalbuminemia/malnutrition-we will order supplements   #9 elevated troponin 75 likely related to demand ischemia we will trend.   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   Echo pending.    Estimated body mass index is 17.87 kg/m as calculated from the following:   Height as of this encounter: 6\' 3"  (1.905 m).   Weight as of this encounter: 64.9 kg.  DVT prophylaxis: Heparin  code Status: Full code Family Communication: None at bedside today Disposition Plan:  Status is: Inpatient  Remains  inpatient appropriate because:Hemodynamically unstable  Dispo: The patient is from: Home              Anticipated d/c is to: Home              Patient currently is not medically stable to d/c.   Difficult to place patient No   Consultants: Infectious disease  Procedures: None Antimicrobials: None  Subjective: He is resting in bed he feels better still having diarrhea but slow down still on IV hydration  Objective: Vitals:   12/28/20 2334 12/29/20 0500 12/29/20 0741 12/29/20 1133  BP: 105/62 104/65 112/63 108/63  Pulse: 65 70 70 77  Resp: 17 16 16    Temp: 98.2 F (36.8 C) 99.6 F (37.6 C) 98.8 F (37.1 C) 99.1 F (37.3 C)  TempSrc: Oral Oral Oral Oral  SpO2: 99% 96% 96% 97%  Weight:      Height:        Intake/Output Summary (Last 24 hours) at 12/29/2020 1353 Last data filed at 12/29/2020 0506 Gross per 24 hour  Intake 4839.66 ml  Output 250 ml  Net 4589.66 ml   Filed Weights   12/28/20 1339  Weight: 64.9 kg    Examination:  General exam: Appears calm and comfortable  Respiratory system: Clear to auscultation. Respiratory effort normal. Cardiovascular system: S1 & S2 heard, RRR. No JVD, murmurs, rubs, gallops or clicks. No pedal edema. Gastrointestinal system: Abdomen is nondistended, soft and nontender. No organomegaly or masses felt. Normal bowel sounds heard. Central nervous system: Alert and oriented. No focal neurological deficits. Extremities: Symmetric 5 x 5 power. Skin: No rashes, lesions or ulcers Psychiatry: Judgement and insight appear normal. Mood & affect appropriate.     Data Reviewed: I have personally reviewed following labs and imaging studies  CBC: Recent Labs  Lab 12/27/20 1056 12/28/20 1512  WBC 12.0* 16.0*  NEUTROABS 9.5* 14.0*  HGB 13.0 14.4  HCT 39.6 45.5  MCV 89.8 91.7  PLT 223 606   Basic Metabolic Panel: Recent Labs  Lab 12/27/20 1056 12/28/20 1512 12/29/20 0437  NA 138 138 140  K 4.1 4.6 4.7  CL 106 110 115*  CO2 21*  21* 19*  GLUCOSE 76 86 88  BUN 27* 39* 36*  CREATININE 1.82* 2.43* 2.03*  CALCIUM 8.9 9.6 8.1*   GFR: Estimated Creatinine Clearance: 32.4 mL/min (A) (by C-G formula based on SCr of 2.03 mg/dL (H)). Liver Function Tests: Recent Labs  Lab 12/27/20 1056 12/28/20 1512 12/29/20 0437  AST 73* 69* 46*  ALT 121* 111* 75*  ALKPHOS 295* 289* 202*  BILITOT 0.9 1.3* 1.1  PROT 6.5 7.2 5.5*  ALBUMIN 2.9* 3.4* 2.6*   Recent Labs  Lab 12/28/20 1512  LIPASE 84*   No results for input(s): AMMONIA in the last 168 hours. Coagulation Profile: Recent Labs  Lab 12/28/20 1512  INR 1.0   Cardiac Enzymes: No results for input(s): CKTOTAL, CKMB, CKMBINDEX, TROPONINI in the last 168 hours. BNP (last 3 results) No results for input(s): PROBNP in the last 8760 hours. HbA1C: No results for input(s): HGBA1C in the last 72 hours. CBG: No results for input(s): GLUCAP in the last 168 hours. Lipid Profile: No results for input(s): CHOL, HDL, LDLCALC, TRIG, CHOLHDL, LDLDIRECT in the last 72 hours. Thyroid Function Tests: Recent Labs    12/27/20 1056  TSH 1.161  Anemia Panel: No results for input(s): VITAMINB12, FOLATE, FERRITIN, TIBC, IRON, RETICCTPCT in the last 72 hours. Sepsis Labs: Recent Labs  Lab 12/28/20 1530 12/28/20 1713 12/29/20 0437  PROCALCITON  --  34.89 40.10  LATICACIDVEN 1.3  --   --     Recent Results (from the past 240 hour(s))  Resp Panel by RT-PCR (Flu A&B, Covid) Nasopharyngeal Swab     Status: None   Collection Time: 12/28/20  3:31 PM   Specimen: Nasopharyngeal Swab; Nasopharyngeal(NP) swabs in vial transport medium  Result Value Ref Range Status   SARS Coronavirus 2 by RT PCR NEGATIVE NEGATIVE Final    Comment: (NOTE) SARS-CoV-2 target nucleic acids are NOT DETECTED.  The SARS-CoV-2 RNA is generally detectable in upper respiratory specimens during the acute phase of infection. The lowest concentration of SARS-CoV-2 viral copies this assay can detect is 138  copies/mL. A negative result does not preclude SARS-Cov-2 infection and should not be used as the sole basis for treatment or other patient management decisions. A negative result may occur with  improper specimen collection/handling, submission of specimen other than nasopharyngeal swab, presence of viral mutation(s) within the areas targeted by this assay, and inadequate number of viral copies(<138 copies/mL). A negative result must be combined with clinical observations, patient history, and epidemiological information. The expected result is Negative.  Fact Sheet for Patients:  EntrepreneurPulse.com.au  Fact Sheet for Healthcare Providers:  IncredibleEmployment.be  This test is no t yet approved or cleared by the [place] FDA and  has been authorized for detection and/or diagnosis of SARS-CoV-2 by FDA under an Emergency Use Authorization (EUA). This EUA will remain  in effect (meaning this test can be used) for the duration of the COVID-19 declaration under Section 564(b)(1) of the Act, 21 U.S.C.section 360bbb-3(b)(1), unless the authorization is terminated  or revoked sooner.       Influenza A by PCR NEGATIVE NEGATIVE Final   Influenza B by PCR NEGATIVE NEGATIVE Final    Comment: (NOTE) The Xpert Xpress SARS-CoV-2/FLU/RSV plus assay is intended as an aid in the diagnosis of influenza from Nasopharyngeal swab specimens and should not be used as a sole basis for treatment. Nasal washings and aspirates are unacceptable for Xpert Xpress SARS-CoV-2/FLU/RSV testing.  Fact Sheet for Patients: EntrepreneurPulse.com.au  Fact Sheet for Healthcare Providers: IncredibleEmployment.be  This test is not yet approved or cleared by the [place] FDA and has been authorized for detection and/or diagnosis of SARS-CoV-2 by FDA under an Emergency Use Authorization (EUA). This EUA will remain in effect (meaning  this test can be used) for the duration of the COVID-19 declaration under Section 564(b)(1) of the Act, 21 U.S.C. section 360bbb-3(b)(1), unless the authorization is terminated or revoked.  Performed at [facility], [place] [address] [place], [place] [address]   Culture, blood (routine x 2)     Status: None (Preliminary result)   Collection Time: 12/28/20  5:12 PM   Specimen: BLOOD  Result Value Ref Range Status   Specimen Description   Final    BLOOD PORTA CATH Performed at [facility] [address] [place], [place] [address]    Special Requests   Final    BOTTLES DRAWN AEROBIC AND ANAEROBIC Blood Culture adequate volume Performed at [facility] [address], [place], [place] [address]    Culture   Final    NO GROWTH < 12 HOURS Performed at [facility] [address], [place], [place]  [address]    Report Status PENDING  Incomplete  Culture, blood (routine x 2)     Status: None (Preliminary result)   Collection Time: 12/28/20  6:28 PM   Specimen: BLOOD  Result Value Ref Range Status   Specimen Description   Final    BLOOD LEFT ARM UPPER Performed at [facility] [address], [place], [place] [address]    Special Requests   Final    BOTTLES DRAWN AEROBIC AND ANAEROBIC Blood Culture adequate volume Performed at [facility] [address], [place], [place] [address]    Culture   Final    NO GROWTH < 12 HOURS Performed at [facility] [address], [place], [place] [address]    Report Status PENDING  Incomplete  MRSA Next Gen by PCR, Nasal     Status: None   Collection Time: 12/28/20  9:00 PM   Specimen: Nasal Mucosa; Nasal Swab  Result Value Ref Range Status   MRSA by PCR Next Gen NOT DETECTED NOT DETECTED Final    Comment: (NOTE) The GeneXpert MRSA Assay (FDA approved for NASAL specimens only), is one component of a comprehensive MRSA colonization  surveillance program. It is not intended to diagnose MRSA infection nor to guide or monitor treatment for MRSA infections. Test performance is not FDA approved in patients less than 75 years old. Performed at [facility], [place] [address], [place], [place] [address]          Radiology Studies: CT Abdomen Pelvis Wo Contrast  Result Date: 12/28/2020 CLINICAL DATA:  Primary Cancer Type: Lung Imaging Indication: Assess response to therapy Interval therapy since last imaging? Yes Initial Cancer Diagnosis Date: 03/29/2016; Established by: Biopsy-proven Detailed Pathology: Stage Ib non-small cell lung cancer, adenocarcinoma. Primary Tumor location:   Left lower lobe. Recurrence? Yes; Date(s) of recurrence: 12/02/2017; Established by: Biopsy-proven; metastasis to T4. Surgeries: Left lower lobectomy and left upper lobe wedge resection, with stapling of apical bleb, 05/11/2016. AAA stent graft 2017. Chemotherapy: Yes; Ongoing? No; Most recent administration: 06/30/2020 Immunotherapy?  Yes; Type: Keytruda; Ongoing? No Radiation therapy? No Other Cancer Therapies: Lumakras daily. EXAM: CT CHEST, ABDOMEN AND PELVIS WITHOUT CONTRAST TECHNIQUE: Multidetector CT imaging of the chest, abdomen and pelvis was performed following the standard protocol without IV contrast. COMPARISON:  Most recent CT chest, abdomen and pelvis 10/17/2020. 11/12/2017 PET-CT. FINDINGS: CT CHEST FINDINGS Cardiovascular: Right chest port catheter. Aortic atherosclerosis. 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 Normal heart size. No pericardial effusion. Mediastinum/Nodes: No enlarged mediastinal, hilar, or axillary lymph nodes. Small hiatal hernia. Thyroid gland, trachea, and esophagus demonstrate no significant findings. Lungs/Pleura: Moderate centrilobular and paraseptal emphysema. Redemonstrated postoperative findings of left lower lobectomy and posterior left upper lobe  wedge resection. Irregular nodular opacities seen on prior examination are diminished or resolved compared to prior examination, an index nodule of the anterior right upper lobe measuring no greater than 0.5 x 0.3 cm, previously 0.8 x 0.6 cm (series 6, image 77) and an index nodule of the peripheral left upper lobe measuring 0.7 x 0 point 4 cm, previously 1.0 x 0.5 cm (series 6, image 75). Pleural thickening and calcification of the dependent right pleura. Trace, chronic, loculated left pleural effusion. Musculoskeletal: No chest wall mass. Unchanged, mildly expansile sclerotic lesion of the spinous process of T4 (series 6, image 45). CT ABDOMEN PELVIS FINDINGS Hepatobiliary: No solid liver abnormality is seen. No gallstones, gallbladder  wall thickening, or biliary dilatation. Pancreas: Unremarkable. No pancreatic ductal dilatation or surrounding inflammatory changes. Spleen: Normal in size without significant abnormality. Adrenals/Urinary Tract: Adrenal glands are unremarkable. Kidneys are normal, without renal calculi, solid lesion, or hydronephrosis. Bladder is unremarkable. Stomach/Bowel: Stomach is within normal limits. Appendix appears normal. No evidence of bowel wall thickening, distention, or inflammatory changes. Vascular/Lymphatic: Aortic atherosclerosis. Status post aortobiiliac stent endograft repair. No enlarged abdominal or pelvic lymph nodes. Reproductive: No mass or other abnormality. Other: No abdominal wall hernia or abnormality. No abdominopelvic ascites. Musculoskeletal: No acute or significant osseous findings. IMPRESSION: 1. Redemonstrated postoperative findings of left lower lobectomy and posterior left upper lobe wedge resection. 2. Irregular nodular opacities seen on prior examination are diminished or resolved compared to prior examination, most consistent with treatment response of pulmonary metastatic disease. Resolving infection or inflammation remain differential considerations. 3. No  noncontrast evidence of new metastatic disease in the chest, abdomen, or pelvis. 4. Unchanged, mildly expansile sclerotic lesion of the spinous process of T4, of uncertain significance. No additional findings to suggest osseous metastatic disease. 5. 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 Recommend annual imaging followup by CTA or MRA. This recommendation follows 2010 ACCF/AHA/AATS/ACR/ASA/SCA/SCAI/SIR/STS/SVM Guidelines for the Diagnosis and Management of Patients with Thoracic Aortic Disease. Circulation. 2010; 121: X412-I786. Aortic aneurysm NOS (ICD10-I71.9) 6. Emphysema and diffuse bilateral bronchial wall thickening. 7. Status post aortobiiliac stent endograft repair. Aortic Atherosclerosis (ICD10-I70.0) and Emphysema (ICD10-J43.9). Electronically Signed   By: Eddie Candle M.D.   On: 12/28/2020 11:01   CT Chest Wo Contrast  Result Date: 12/28/2020 CLINICAL DATA:  Primary Cancer Type: Lung Imaging Indication: Assess response to therapy Interval therapy since last imaging? Yes Initial Cancer Diagnosis Date: 03/29/2016; Established by: Biopsy-proven Detailed Pathology: Stage Ib non-small cell lung cancer, adenocarcinoma. Primary Tumor location:   Left lower lobe. Recurrence? Yes; Date(s) of recurrence: 12/02/2017; Established by: Biopsy-proven; metastasis to T4. Surgeries: Left lower lobectomy and left upper lobe wedge resection, with stapling of apical bleb, 05/11/2016. AAA stent graft 2017. Chemotherapy: Yes; Ongoing? No; Most recent administration: 06/30/2020 Immunotherapy?  Yes; Type: Keytruda; Ongoing? No Radiation therapy? No Other Cancer Therapies: Lumakras daily. EXAM: CT CHEST, ABDOMEN AND PELVIS WITHOUT CONTRAST TECHNIQUE: Multidetector CT imaging of the chest, abdomen and pelvis was performed following the standard protocol without IV contrast. COMPARISON:  Most recent CT chest, abdomen and pelvis 10/17/2020. 11/12/2017  PET-CT. FINDINGS: CT CHEST FINDINGS Cardiovascular: Right chest port catheter. Aortic atherosclerosis. 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 Normal heart size. No pericardial effusion. Mediastinum/Nodes: No enlarged mediastinal, hilar, or axillary lymph nodes. Small hiatal hernia. Thyroid gland, trachea, and esophagus demonstrate no significant findings. Lungs/Pleura: Moderate centrilobular and paraseptal emphysema. Redemonstrated postoperative findings of left lower lobectomy and posterior left upper lobe wedge resection. Irregular nodular opacities seen on prior examination are diminished or resolved compared to prior examination, an index nodule of the anterior right upper lobe measuring no greater than 0.5 x 0.3 cm, previously 0.8 x 0.6 cm (series 6, image 77) and an index nodule of the peripheral left upper lobe measuring 0.7 x 0 point 4 cm, previously 1.0 x 0.5 cm (series 6, image 75). Pleural thickening and calcification of the dependent right pleura. Trace, chronic, loculated left pleural effusion. Musculoskeletal: No chest wall mass. Unchanged, mildly expansile sclerotic lesion of the spinous process of T4 (series 6, image 45). CT ABDOMEN PELVIS  FINDINGS Hepatobiliary: No solid liver abnormality is seen. No gallstones, gallbladder wall thickening, or biliary dilatation. Pancreas: Unremarkable. No pancreatic ductal dilatation or surrounding inflammatory changes. Spleen: Normal in size without significant abnormality. Adrenals/Urinary Tract: Adrenal glands are unremarkable. Kidneys are normal, without renal calculi, solid lesion, or hydronephrosis. Bladder is unremarkable. Stomach/Bowel: Stomach is within normal limits. Appendix appears normal. No evidence of bowel wall thickening, distention, or inflammatory changes. Vascular/Lymphatic: Aortic atherosclerosis. Status post aortobiiliac stent endograft repair. No enlarged abdominal or pelvic lymph nodes.  Reproductive: No mass or other abnormality. Other: No abdominal wall hernia or abnormality. No abdominopelvic ascites. Musculoskeletal: No acute or significant osseous findings. IMPRESSION: 1. Redemonstrated postoperative findings of left lower lobectomy and posterior left upper lobe wedge resection. 2. Irregular nodular opacities seen on prior examination are diminished or resolved compared to prior examination, most consistent with treatment response of pulmonary metastatic disease. Resolving infection or inflammation remain differential considerations. 3. No noncontrast evidence of new metastatic disease in the chest, abdomen, or pelvis. 4. Unchanged, mildly expansile sclerotic lesion of the spinous process of T4, of uncertain significance. No additional findings to suggest osseous metastatic disease. 5. 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 Recommend annual imaging followup by CTA or MRA. This recommendation follows 2010 ACCF/AHA/AATS/ACR/ASA/SCA/SCAI/SIR/STS/SVM Guidelines for the Diagnosis and Management of Patients with Thoracic Aortic Disease. Circulation. 2010; 121: C623-J628. Aortic aneurysm NOS (ICD10-I71.9) 6. Emphysema and diffuse bilateral bronchial wall thickening. 7. Status post aortobiiliac stent endograft repair. Aortic Atherosclerosis (ICD10-I70.0) and Emphysema (ICD10-J43.9). Electronically Signed   By: Eddie Candle M.D.   On: 12/28/2020 11:01   DG Chest Port 1 View  Result Date: 12/28/2020 CLINICAL DATA:  65 year old male with history of shortness of breath. History of left lower lobe lung cancer undergoing treatment. EXAM: PORTABLE CHEST 1 VIEW COMPARISON:  Chest x-ray 11/29/2016. FINDINGS: Right-sided internal jugular single-lumen porta cath with tip terminating at the superior cavoatrial junction. Status post left lower lobectomy with compensatory hyperexpansion of the left upper lobe. Lung volumes are normal. No  consolidative airspace disease. No pleural effusions. No pneumothorax. No pulmonary nodule or mass noted. Pulmonary vasculature and the cardiomediastinal silhouette are within normal limits. Atherosclerosis in the thoracic aorta. IMPRESSION: 1. No radiographic evidence of acute cardiopulmonary disease. 2. Emphysema. 3. Status post left lower lobectomy. 4. Aortic atherosclerosis. Electronically Signed   By: Vinnie Langton M.D.   On: 12/28/2020 14:49        Scheduled Meds:  Chlorhexidine Gluconate Cloth  6 each Topical Daily   feeding supplement  237 mL Oral BID BM   heparin  5,000 Units Subcutaneous Q8H   predniSONE  20 mg Oral Q breakfast   Followed by   Derrill Memo ON 01/05/2021] predniSONE  10 mg Oral Q breakfast   sodium chloride flush  10-40 mL Intracatheter Q12H   Continuous Infusions:  sodium chloride 125 mL/hr at 12/29/20 0130     LOS: 1 day    Time spent: 3 min  Georgette Shell, MD 12/29/2020, 1:53 PM

## 2020-12-29 NOTE — Consult Note (Signed)
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 though he says he has had a false positive test in the past  Non-small cell lung cancer status post left lower lobectomy wedge resection on adjuvant chemotherapy Myxomatous mitral valve Hypertension on medications Possible history of what sounds like hepatitis C Transaminitis deu to LUmakras Elevated procalcitonin likely from his chronic kidney disease  Plan:  DC cefepime Observe off of antibiotics Check HIV RNA quant given the slow turnaround on the discriminatory assay for HIV Check hepatitis C RNA and check hepatitis B surface antigen and surface antibody and hepatitis A antibodies   Active Problems:   SOB (shortness of breath)   Scheduled Meds:  Chlorhexidine Gluconate Cloth  6 each Topical Daily   feeding supplement  237 mL Oral BID BM   heparin  5,000 Units Subcutaneous Q8H   predniSONE  20 mg Oral Q breakfast   Followed by   Derrill Memo ON 01/05/2021] predniSONE  10 mg Oral Q breakfast   sodium chloride flush  10-40 mL Intracatheter Q12H   Continuous Infusions:  sodium chloride 125 mL/hr at 12/29/20 0130   PRN Meds:.acetaminophen, loperamide, sodium chloride flush  HPI: [name]. is a 65 y.o. male with history of non-small cell lung cancer status post left lower lobectomy wedge resection of left upper lobe in 2018 on adjuvant chemotherapy. More recently he was on Keytruda monotherapy due to renal insufficiency.  He then was initiated on Lumakras in July but had sverer drug induced hepatitis that was treated with corticosteroids..  He had surveillance CT scan done of the chest abdomen  pelvis yesterday.  After the CT he was having profuse diarrhea related to the oral contrast it seems that he was taking.  He had a similar experience last time he had a surveillance CT.  This time he was feeling profoundly weak and also feeling short of breath.  Came to the ER where his blood pressure was 85/61.  He has not been having nausea vomiting or abdominal pain chest pain.  He was still taking his antihypertensives in the context of his profuse diarrhea.  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  Does have comorbid hypertension and also chronic kidney disease and sounds like prior treatment for hepatitis C as well as a myxomatous valve   He had an isolated temperature upon arrival.  Blood cultures were taken and he was started on cefepime due to concerns that he could have an infection though his imaging does not suggest a source and I suspect his low blood pressure is entirely due to his dehydration in the context of profuse diarrhea from his oral contrast.  His preliminary fourth-generation assay for HIV was positive in talking to him he says that he was tested for HIV in  the past at [facility] and that he had a false positive test it sounds like he was treated for hepatitis C at that time as well.  He is currently married and in a monogamous relationship to his knowledge but he has not been sexually active recently that frequently in the context of his chemotherapy for his lung cancer.  I spent 84 minutes with the patient including than greater than 50% of the time in  face to face counseling of the patient and his prelim positive HIV test his recent diarrhea after contrast, his treatment for viral hepatitis personally reviewing T chest abdomen pelvis done for screening purposes his admission CBC differential metabolic panel blood cultures urine cultures, along with review of medical records in preparation for the visit and during the visit and in coordination of her care.    Review of Systems: Review of Systems  Constitutional:  Negative for chills, diaphoresis, fever, malaise/fatigue and weight loss.  HENT:  Negative for congestion, hearing loss, sore throat and tinnitus.   Eyes:  Negative for blurred vision, double vision and photophobia.  Respiratory:  Negative for cough, sputum production, shortness of breath and wheezing.   Cardiovascular:  Negative for chest pain, palpitations and leg swelling.  Gastrointestinal:  Positive for diarrhea. Negative for abdominal pain, blood in stool, constipation, heartburn, melena, nausea and vomiting.  Genitourinary:  Negative for dysuria, flank pain and hematuria.  Musculoskeletal:  Negative for back pain, falls, joint pain and myalgias.  Skin:  Negative for itching and rash.  Neurological:  Positive for weakness. Negative for dizziness, sensory change, focal weakness, loss of consciousness and headaches.  Endo/Heme/Allergies:  Does not bruise/bleed easily.  Psychiatric/Behavioral:  Negative for depression, memory loss and suicidal ideas. The patient is not nervous/anxious and does not have insomnia.    Past Medical History:  Diagnosis Date   AAA (abdominal aortic aneurysm) ([place])    AAA (abdominal aortic aneurysm) without rupture ([place]) 02/04/2014   Cancer ([place])    lung   Encounter for antineoplastic chemotherapy 06/06/2016   History of hepatitis B    HNP (herniated nucleus pulposus), lumbar    L4 with radiculopathy   Hypertension    Lung cancer ([place]) 05/18/2016   Malignant neoplasm of lower lobe of left lung  ([place]) 04/05/2016   Mass of left lung    Mitral insufficiency 04/06/2016   This patient will eventually need MV repair if his prognosis is good from oncology standpoint. However, his lung cancer therapy  is the priority right now. His cardiac condition won't preclude possible lung surgery.  Once his lung cancer is under control he will need a TEE to further evaluate his mitral valve anatomy and MR severity, and also have ischemic workup as tere is evidence on calcificati   Renal insufficiency 10/09/2016   S/P partial lobectomy of lung 05/11/2016   Varicose veins of legs     Social History   Tobacco Use   Smoking status: Former    Packs/day: 1.00    Types: Cigarettes    Quit date: 03/05/2016    Years since quitting: 4.8   Smokeless tobacco: Never  Vaping Use   Vaping Use: Never used  Substance Use Topics   Alcohol use: Yes    Alcohol/week: 6.0 standard drinks    Types: 6 Cans of beer per week   Drug use: Yes    Types: Cocaine, Heroin    Comment: abused drugs 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 rate 16, height 6\' 3"  (1.905 m), weight 64.9 kg, SpO2 97 %.  Physical Exam Constitutional:      Appearance: He is well-developed.  HENT:     Head: Normocephalic and atraumatic.  Eyes:     General: No scleral icterus.       Right eye: No discharge.        Left eye: No discharge.     Conjunctiva/sclera: Conjunctivae normal.  Cardiovascular:     Rate and Rhythm: Normal rate and regular rhythm.     Heart sounds: Murmur heard.    No friction rub. No gallop.  Pulmonary:     Effort: Pulmonary effort is normal. No respiratory distress.     Breath sounds: No stridor. No wheezing or rales.  Abdominal:     General: There is no distension.     Palpations: Abdomen is  soft. There is no mass.     Tenderness: There is no abdominal tenderness.     Hernia: No hernia is present.  Musculoskeletal:        General: No tenderness. Normal range of motion.     Cervical back: Normal range of motion and neck supple.  Skin:    General: Skin is warm and dry.     Coloration: Skin is not pale.     Findings: No erythema or rash.  Neurological:     General: No focal deficit present.     Mental Status: He is alert and oriented to person, place, and time.  Psychiatric:        Mood and Affect: Mood normal.        Behavior: Behavior normal.        Thought Content: Thought content normal.        Judgment: Judgment normal.   Port-A-Cath is clean  Lab Results Lab Results  Component Value Date   WBC 16.0 (H) 12/28/2020   HGB 14.4 12/28/2020   HCT 45.5 12/28/2020   MCV 91.7 12/28/2020   PLT 201 12/28/2020    Lab Results  Component Value Date   CREATININE 2.03 (H) 12/29/2020   BUN 36 (H) 12/29/2020   NA 140 12/29/2020   K 4.7 12/29/2020   CL 115 (H) 12/29/2020   CO2 19 (L) 12/29/2020    Lab Results  Component Value Date   ALT 75 (H) 12/29/2020   AST 46 (H) 12/29/2020   ALKPHOS 202 (H) 12/29/2020   BILITOT 1.1 12/29/2020     Microbiology: Recent Results (from the past 240 hour(s))  Resp Panel by RT-PCR (Flu A&B, Covid) Nasopharyngeal Swab     Status: None   Collection Time: 12/28/20  3:31 PM   Specimen: Nasopharyngeal Swab; Nasopharyngeal(NP) swabs in vial transport medium  Result Value Ref Range Status   SARS Coronavirus 2 by RT PCR NEGATIVE NEGATIVE Final    Comment: (NOTE) SARS-CoV-2 target nucleic acids are NOT DETECTED.  The SARS-CoV-2 RNA is generally detectable in upper respiratory specimens during the acute phase of infection. The lowest concentration of SARS-CoV-2 viral copies this assay can detect is 138 copies/mL. A negative result does not preclude SARS-Cov-2 infection and should not be used as the sole basis for treatment or other  patient management decisions. A negative result may occur with  improper specimen collection/handling, submission of  specimen other than nasopharyngeal swab, presence of viral mutation(s) within the areas targeted by this assay, and inadequate number of viral copies(<138 copies/mL). A negative result must be combined with clinical observations, patient history, and epidemiological information. The expected result is Negative.  Fact Sheet for Patients:  EntrepreneurPulse.com.au  Fact Sheet for Healthcare Providers:  IncredibleEmployment.be  This test is no t yet approved or cleared by the [place] FDA and  has been authorized for detection and/or diagnosis of SARS-CoV-2 by FDA under an Emergency Use Authorization (EUA). This EUA will remain  in effect (meaning this test can be used) for the duration of the COVID-19 declaration under Section 564(b)(1) of the Act, 21 U.S.C.section 360bbb-3(b)(1), unless the authorization is terminated  or revoked sooner.       Influenza A by PCR NEGATIVE NEGATIVE Final   Influenza B by PCR NEGATIVE NEGATIVE Final    Comment: (NOTE) The Xpert Xpress SARS-CoV-2/FLU/RSV plus assay is intended as an aid in the diagnosis of influenza from Nasopharyngeal swab specimens and should not be used as a sole basis for treatment. Nasal washings and aspirates are unacceptable for Xpert Xpress SARS-CoV-2/FLU/RSV testing.  Fact Sheet for Patients: EntrepreneurPulse.com.au  Fact Sheet for Healthcare Providers: IncredibleEmployment.be  This test is not yet approved or cleared by the [place] FDA and has been authorized for detection and/or diagnosis of SARS-CoV-2 by FDA under an Emergency Use Authorization (EUA). This EUA will remain in effect (meaning this test can be used) for the duration of the COVID-19 declaration under Section 564(b)(1) of the Act, 21 U.S.C. section  360bbb-3(b)(1), unless the authorization is terminated or revoked.  Performed at [facility], [place] [address], [place], [place] [address]   Culture, blood (routine x 2)     Status: None (Preliminary result)   Collection Time: 12/28/20  5:12 PM   Specimen: BLOOD  Result Value Ref Range Status   Specimen Description   Final    BLOOD PORTA CATH Performed at [facility] [address], [place], [place] [address]    Special Requests   Final    BOTTLES DRAWN AEROBIC AND ANAEROBIC Blood Culture adequate volume Performed at [facility] [address], [place], [place] [address]    Culture   Final    NO GROWTH < 12 HOURS Performed at [facility] [address], [place], [place] [address]    Report Status PENDING  Incomplete  Culture, blood (routine x 2)     Status: None (Preliminary result)   Collection Time: 12/28/20  6:28 PM   Specimen: BLOOD  Result Value Ref Range Status   Specimen Description   Final    BLOOD LEFT ARM UPPER Performed at [facility] [address], [place], [place] [address]    Special Requests   Final    BOTTLES DRAWN AEROBIC AND ANAEROBIC Blood Culture adequate volume Performed at [facility] [address] [place], [place] [address]    Culture   Final    NO GROWTH < 12 HOURS Performed at [facility] [address] [place],  [address]    Report Status PENDING  Incomplete  MRSA Next Gen by PCR, Nasal     Status: None   Collection Time: 12/28/20  9:00 PM   Specimen: Nasal Mucosa; Nasal Swab  Result Value Ref Range Status   MRSA by PCR Next Gen NOT DETECTED NOT DETECTED Final    Comment: (  NOTE) The GeneXpert MRSA Assay (FDA approved for NASAL specimens only), is one component of a comprehensive MRSA colonization surveillance program. It is not intended to diagnose MRSA infection nor to guide or monitor treatment for MRSA  infections. Test performance is not FDA approved in patients less than 72 years old. Performed at [facility], [place] [address] [place], [place] [address]     Alcide Evener, [place] [place] [facility] [phone] pager  12/29/2020, 12:39 PM

## 2020-12-29 NOTE — Plan of Care (Signed)
  Problem: Coping: Goal: Level of anxiety will decrease Outcome: Completed/Met   Problem: Skin Integrity: Goal: Risk for impaired skin integrity will decrease Outcome: Completed/Met   

## 2020-12-29 NOTE — Plan of Care (Signed)
  Problem: Education: Goal: Knowledge of General Education information will improve Description: Including pain rating scale, medication(s)/side effects and non-pharmacologic comfort measures Outcome: Progressing   Problem: Clinical Measurements: Goal: Will remain free from infection Outcome: Progressing Goal: Diagnostic test results will improve Outcome: Progressing Goal: Respiratory complications will improve Outcome: Progressing   

## 2020-12-29 NOTE — Progress Notes (Signed)
  Echocardiogram 2D Echocardiogram has been performed.  [name] 12/29/2020, 3:13 PM

## 2020-12-30 LAB — CBC
HCT: 36 % — ABNORMAL LOW (ref 39.0–52.0)
Hemoglobin: 10.9 g/dL — ABNORMAL LOW (ref 13.0–17.0)
MCH: 29.3 pg (ref 26.0–34.0)
MCHC: 30.3 g/dL (ref 30.0–36.0)
MCV: 96.8 fL (ref 80.0–100.0)
Platelets: 153 10*3/uL (ref 150–400)
RBC: 3.72 MIL/uL — ABNORMAL LOW (ref 4.22–5.81)
RDW: 17.4 % — ABNORMAL HIGH (ref 11.5–15.5)
WBC: 11.3 10*3/uL — ABNORMAL HIGH (ref 4.0–10.5)
nRBC: 0 % (ref 0.0–0.2)

## 2020-12-30 LAB — COMPREHENSIVE METABOLIC PANEL
ALT: 60 U/L — ABNORMAL HIGH (ref 0–44)
AST: 35 U/L (ref 15–41)
Albumin: 2.7 g/dL — ABNORMAL LOW (ref 3.5–5.0)
Alkaline Phosphatase: 185 U/L — ABNORMAL HIGH (ref 38–126)
Anion gap: 5 (ref 5–15)
BUN: 30 mg/dL — ABNORMAL HIGH (ref 8–23)
CO2: 20 mmol/L — ABNORMAL LOW (ref 22–32)
Calcium: 7.9 mg/dL — ABNORMAL LOW (ref 8.9–10.3)
Chloride: 111 mmol/L (ref 98–111)
Creatinine, Ser: 1.72 mg/dL — ABNORMAL HIGH (ref 0.61–1.24)
GFR, Estimated: 43 mL/min — ABNORMAL LOW (ref >60)
Glucose, Bld: 88 mg/dL (ref 70–99)
Potassium: 4.3 mmol/L (ref 3.5–5.1)
Sodium: 136 mmol/L (ref 135–145)
Total Bilirubin: 0.8 mg/dL (ref 0.3–1.2)
Total Protein: 5.4 g/dL — ABNORMAL LOW (ref 6.5–8.1)

## 2020-12-30 LAB — HEPATITIS B SURFACE ANTIGEN: Hepatitis B Surface Ag: NONREACTIVE

## 2020-12-30 LAB — PROCALCITONIN: Procalcitonin: 30.48 ng/mL

## 2020-12-30 LAB — HEPATITIS A ANTIBODY, TOTAL: hep A Total Ab: REACTIVE — AB

## 2020-12-30 MED ORDER — HEPARIN SOD (PORK) LOCK FLUSH 100 UNIT/ML IV SOLN
500.0000 [IU] | INTRAVENOUS | Status: AC | PRN
  Administered 2020-12-30: 500 [IU]
  Filled 2020-12-30: qty 5

## 2020-12-30 NOTE — Discharge Instructions (Signed)
[employer] is the DME company providing oxygen.  Contact information is [phone] [facility] is home health agency who will contact you to set up first visit.

## 2020-12-30 NOTE — Plan of Care (Signed)
  Problem: Education: Goal: Knowledge of General Education information will improve Description: Including pain rating scale, medication(s)/side effects and non-pharmacologic comfort measures Outcome: Progressing   Problem: Clinical Measurements: Goal: Will remain free from infection Outcome: Progressing Goal: Respiratory complications will improve Outcome: Progressing

## 2020-12-30 NOTE — Discharge Summary (Signed)
Physician Discharge Summary  [name] HEN:[id number] DOB: 01-23-1956 DOA: 12/28/2020  PCP: [name], MD  Admit date: 12/28/2020 Discharge date: 12/30/2020  Admitted From: Home Disposition Home  Recommendations for Outpatient Follow-up:  Follow up with PCP in 1-2 weeks Please obtain BMP/CBC in one week Please follow up with oncologist  Home Health: Yes Equipment/Devices: Oxygen 2 L Discharge Condition: Stable CODE STATUS: Full code Diet recommendation: Cardiac Brief/Interim Summary:William K Elan Brainerd. is a 65 y.o. male with medical history significant of lung cancer non-small cell lung cancer adenocarcinoma status post left lower lobectomy as well as wedge resection of the left upper lobe in 2018 on adjuvant chemotherapy last chemo 3 months ago, hypertension had a surveillance CT scan done today after p.o. contrast.  He had CT of the chest abdomen and pelvis.  After the CT scan he went home and then he started having diarrhea with generalized weakness and shortness of breath.  He denies any chest pain syncopal episodes.  He was on lisinopril and carvedilol at home which she continued to take.  His blood pressure on arrival to ER was 85/61.  He denied any nausea vomiting abdominal pain chest pain.  He denied any urinary complaints.  He continued to have diarrhea even while in the ER.  He denies use of antibiotics recently. 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 Irregular nodular opacities seen on prior examination are diminished or resolved compared to prior examination, most consistent with treatment response of pulmonary metastatic disease. Resolving infection or inflammation remain  differential considerations.No noncontrast evidence of new metastatic disease in the chest, abdomen, or pelvis. Unchanged, mildly expansile sclerotic lesion of the spinous process of T4, of uncertain significance. No additional findings to suggest osseous metastatic disease. 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 Recommend annual imaging followup by CTA or MRA. This recommendation follows 2010 ACCF/AHA/AATS/ACR/ASA/SCA/SCAI/SIR/STS/SVM Guidelines for the Diagnosis and Management of Patients with Thoracic Aortic Disease. Circulation. 2010; 121: T614-E315. Aortic aneurysm NOS (ICD10-I71.9) Emphysema and diffuse bilateral bronchial wall thickening. Status post aortobiiliac stent endograft repair.  Discharge Diagnoses:  Active Problems:   SOB (shortness of breath)    #1 sirs-sepsis ruled out.  At the time of admission patient had a temp of 100.5 with blood pressure of 80/53 heart rate 120s and tachypneic and hypoxic and was placed on oxygen.  He was also found to have AKI secondary to dehydration and hypotension.  His cultures remain negative.  He was covered with cefepime the first 24 hours he was admitted.  MRSA PCR was negative.    #2 hypotension likely related to dehydration/diarrhea-resolved with IV fluids alone.  Coreg and lisinopril was on hold during hospital stay.  This is stopped on discharge patient and family is aware that they will need to recheck his blood pressure daily at home and restart blood pressure medications if blood pressure is trending high.   #3 history of non-small cell lung CA followed by Dr. Julien Nordmann   #4 AKI on CKD stage IIIb creatinine 0.72 down from 2.43 on admission wit hydration.   #5 elevated lipase denies any abdominal tenderness nausea vomiting   #6 diarrhea -resolved this is related to  dye he drank prior to CT scan.   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   Echo ejection fraction 60 to 65%.  Left ventricle has no regional wall motion abnormalities.  Mitral valve is myxomatous moderate to severe mitral valve regurgitation.    Nutrition Problem: Moderate Malnutrition Etiology: chronic illness, cancer and cancer related treatments    Signs/Symptoms: moderate fat depletion, moderate muscle depletion     Interventions: Ensure Enlive (each supplement provides 350kcal and 20 grams of protein), Magic cup, MVI  Estimated body mass index is 17.87 kg/m as calculated from the following:   Height as of this encounter: 6\' 3"  (1.905 m).   Weight as of this encounter: 64.9 kg.  Discharge Instructions  Discharge Instructions     Diet - low sodium heart healthy   Complete by: As directed    Home O2 eval (desaturation screen)   Complete by: As directed    Increase activity slowly   Complete by: As directed       Allergies as of 12/30/2020       Reactions   Tramadol Nausea And Vomiting        Medication List     STOP taking these medications    carvedilol 6.25 MG tablet Commonly known as: COREG   lisinopril 2.5 MG tablet Commonly known as: ZESTRIL   Lumakras 120 MG Tabs Generic drug: sotorasib       TAKE these medications    loperamide 2 MG tablet Commonly known as: IMODIUM A-D Take 2 mg by mouth 4 (four) times daily as needed for diarrhea or loose stools.   predniSONE 10 MG tablet Commonly known as: DELTASONE 6 tablets p.o. daily for 1 week followed by 4 tablet p.o. daily for 1 week followed by 2 tablet p.o. daily for 1 week followed by 1 tablet p.o. daily for 1 week then stop. What changed:  how much to take how to take this when to take  this additional instructions               Durable Medical Equipment  (From admission, onward)           Start     Ordered   12/30/20 1216  For home use only DME oxygen  Once       Question Answer Comment  Length of Need 6 Months   Mode or (Route) Nasal cannula   Liters per Minute 2   Frequency Continuous (stationary and portable oxygen unit needed)   Oxygen conserving device Yes   Oxygen delivery system Gas      12/30/20 1216            Follow-up Information     [name], MD Follow up.   Specialty: Family Medicine Contact information: [address] [place] [address] [phone]         Sherren Mocha, MD .   Specialty: Cardiology Contact information: [address]  [place] [address] [phone]                Allergies  Allergen Reactions   Tramadol Nausea And Vomiting    Consultations: None    Procedures/Studies: CT Abdomen Pelvis Wo Contrast  Result Date: 12/28/2020 CLINICAL DATA:  Primary  Cancer Type: Lung Imaging Indication: Assess response to therapy Interval therapy since last imaging? Yes Initial Cancer Diagnosis Date: 03/29/2016; Established by: Biopsy-proven Detailed Pathology: Stage Ib non-small cell lung cancer, adenocarcinoma. Primary Tumor location:   Left lower lobe. Recurrence? Yes; Date(s) of recurrence: 12/02/2017; Established by: Biopsy-proven; metastasis to T4. Surgeries: Left lower lobectomy and left upper lobe wedge resection, with stapling of apical bleb, 05/11/2016. AAA stent graft 2017. Chemotherapy: Yes; Ongoing? No; Most recent administration: 06/30/2020 Immunotherapy?  Yes; Type: Keytruda; Ongoing? No Radiation therapy? No Other Cancer Therapies: Lumakras daily. EXAM: CT CHEST, ABDOMEN AND PELVIS WITHOUT CONTRAST TECHNIQUE: Multidetector CT imaging of the chest, abdomen and pelvis was performed following the standard protocol without IV contrast. COMPARISON:  Most recent CT chest,  abdomen and pelvis 10/17/2020. 11/12/2017 PET-CT. FINDINGS: CT CHEST FINDINGS Cardiovascular: Right chest port catheter. Aortic atherosclerosis. 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 Normal heart size. No pericardial effusion. Mediastinum/Nodes: No enlarged mediastinal, hilar, or axillary lymph nodes. Small hiatal hernia. Thyroid gland, trachea, and esophagus demonstrate no significant findings. Lungs/Pleura: Moderate centrilobular and paraseptal emphysema. Redemonstrated postoperative findings of left lower lobectomy and posterior left upper lobe wedge resection. Irregular nodular opacities seen on prior examination are diminished or resolved compared to prior examination, an index nodule of the anterior right upper lobe measuring no greater than 0.5 x 0.3 cm, previously 0.8 x 0.6 cm (series 6, image 77) and an index nodule of the peripheral left upper lobe measuring 0.7 x 0 point 4 cm, previously 1.0 x 0.5 cm (series 6, image 75). Pleural thickening and calcification of the dependent right pleura. Trace, chronic, loculated left pleural effusion. Musculoskeletal: No chest wall mass. Unchanged, mildly expansile sclerotic lesion of the spinous process of T4 (series 6, image 45). CT ABDOMEN PELVIS FINDINGS Hepatobiliary: No solid liver abnormality is seen. No gallstones, gallbladder wall thickening, or biliary dilatation. Pancreas: Unremarkable. No pancreatic ductal dilatation or surrounding inflammatory changes. Spleen: Normal in size without significant abnormality. Adrenals/Urinary Tract: Adrenal glands are unremarkable. Kidneys are normal, without renal calculi, solid lesion, or hydronephrosis. Bladder is unremarkable. Stomach/Bowel: Stomach is within normal limits. Appendix appears normal. No evidence of bowel wall thickening, distention, or inflammatory changes. Vascular/Lymphatic: Aortic atherosclerosis. Status post aortobiiliac stent endograft repair. No  enlarged abdominal or pelvic lymph nodes. Reproductive: No mass or other abnormality. Other: No abdominal wall hernia or abnormality. No abdominopelvic ascites. Musculoskeletal: No acute or significant osseous findings. IMPRESSION: 1. Redemonstrated postoperative findings of left lower lobectomy and posterior left upper lobe wedge resection. 2. Irregular nodular opacities seen on prior examination are diminished or resolved compared to prior examination, most consistent with treatment response of pulmonary metastatic disease. Resolving infection or inflammation remain differential considerations. 3. No noncontrast evidence of new metastatic disease in the chest, abdomen, or pelvis. 4. Unchanged, mildly expansile sclerotic lesion of the spinous process of T4, of uncertain significance. No additional findings to suggest osseous metastatic disease. 5. 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 Recommend annual imaging followup by CTA or MRA. This recommendation follows 2010 ACCF/AHA/AATS/ACR/ASA/SCA/SCAI/SIR/STS/SVM Guidelines for the Diagnosis and Management of Patients with Thoracic Aortic Disease. Circulation. 2010; 121: S496-P591. Aortic aneurysm NOS (ICD10-I71.9) 6. Emphysema and diffuse bilateral bronchial wall thickening. 7. Status post aortobiiliac stent endograft repair. Aortic Atherosclerosis (ICD10-I70.0) and Emphysema (ICD10-J43.9). Electronically Signed   By: Eddie Candle M.D.   On: 12/28/2020 11:01   CT  Chest Wo Contrast  Result Date: 12/28/2020 CLINICAL DATA:  Primary Cancer Type: Lung Imaging Indication: Assess response to therapy Interval therapy since last imaging? Yes Initial Cancer Diagnosis Date: 03/29/2016; Established by: Biopsy-proven Detailed Pathology: Stage Ib non-small cell lung cancer, adenocarcinoma. Primary Tumor location:   Left lower lobe. Recurrence? Yes; Date(s) of recurrence: 12/02/2017; Established by:  Biopsy-proven; metastasis to T4. Surgeries: Left lower lobectomy and left upper lobe wedge resection, with stapling of apical bleb, 05/11/2016. AAA stent graft 2017. Chemotherapy: Yes; Ongoing? No; Most recent administration: 06/30/2020 Immunotherapy?  Yes; Type: Keytruda; Ongoing? No Radiation therapy? No Other Cancer Therapies: Lumakras daily. EXAM: CT CHEST, ABDOMEN AND PELVIS WITHOUT CONTRAST TECHNIQUE: Multidetector CT imaging of the chest, abdomen and pelvis was performed following the standard protocol without IV contrast. COMPARISON:  Most recent CT chest, abdomen and pelvis 10/17/2020. 11/12/2017 PET-CT. FINDINGS: CT CHEST FINDINGS Cardiovascular: Right chest port catheter. Aortic atherosclerosis. 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 Normal heart size. No pericardial effusion. Mediastinum/Nodes: No enlarged mediastinal, hilar, or axillary lymph nodes. Small hiatal hernia. Thyroid gland, trachea, and esophagus demonstrate no significant findings. Lungs/Pleura: Moderate centrilobular and paraseptal emphysema. Redemonstrated postoperative findings of left lower lobectomy and posterior left upper lobe wedge resection. Irregular nodular opacities seen on prior examination are diminished or resolved compared to prior examination, an index nodule of the anterior right upper lobe measuring no greater than 0.5 x 0.3 cm, previously 0.8 x 0.6 cm (series 6, image 77) and an index nodule of the peripheral left upper lobe measuring 0.7 x 0 point 4 cm, previously 1.0 x 0.5 cm (series 6, image 75). Pleural thickening and calcification of the dependent right pleura. Trace, chronic, loculated left pleural effusion. Musculoskeletal: No chest wall mass. Unchanged, mildly expansile sclerotic lesion of the spinous process of T4 (series 6, image 45). CT ABDOMEN PELVIS FINDINGS Hepatobiliary: No solid liver abnormality is seen. No gallstones, gallbladder wall thickening, or  biliary dilatation. Pancreas: Unremarkable. No pancreatic ductal dilatation or surrounding inflammatory changes. Spleen: Normal in size without significant abnormality. Adrenals/Urinary Tract: Adrenal glands are unremarkable. Kidneys are normal, without renal calculi, solid lesion, or hydronephrosis. Bladder is unremarkable. Stomach/Bowel: Stomach is within normal limits. Appendix appears normal. No evidence of bowel wall thickening, distention, or inflammatory changes. Vascular/Lymphatic: Aortic atherosclerosis. Status post aortobiiliac stent endograft repair. No enlarged abdominal or pelvic lymph nodes. Reproductive: No mass or other abnormality. Other: No abdominal wall hernia or abnormality. No abdominopelvic ascites. Musculoskeletal: No acute or significant osseous findings. IMPRESSION: 1. Redemonstrated postoperative findings of left lower lobectomy and posterior left upper lobe wedge resection. 2. Irregular nodular opacities seen on prior examination are diminished or resolved compared to prior examination, most consistent with treatment response of pulmonary metastatic disease. Resolving infection or inflammation remain differential considerations. 3. No noncontrast evidence of new metastatic disease in the chest, abdomen, or pelvis. 4. Unchanged, mildly expansile sclerotic lesion of the spinous process of T4, of uncertain significance. No additional findings to suggest osseous metastatic disease. 5. 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 Recommend annual imaging followup by CTA or MRA. This recommendation follows 2010 ACCF/AHA/AATS/ACR/ASA/SCA/SCAI/SIR/STS/SVM Guidelines for the Diagnosis and Management of Patients with Thoracic Aortic Disease. Circulation. 2010; 121: E423-N361. Aortic aneurysm NOS (ICD10-I71.9) 6. Emphysema and diffuse bilateral bronchial wall thickening. 7. Status post aortobiiliac stent endograft repair. Aortic  Atherosclerosis (ICD10-I70.0) and Emphysema (ICD10-J43.9). Electronically Signed   By:  Eddie Candle M.D.   On: 12/28/2020 11:01   DG Chest Port 1 View  Result Date: 12/28/2020 CLINICAL DATA:  65 year old male with history of shortness of breath. History of left lower lobe lung cancer undergoing treatment. EXAM: PORTABLE CHEST 1 VIEW COMPARISON:  Chest x-ray 11/29/2016. FINDINGS: Right-sided internal jugular single-lumen porta cath with tip terminating at the superior cavoatrial junction. Status post left lower lobectomy with compensatory hyperexpansion of the left upper lobe. Lung volumes are normal. No consolidative airspace disease. No pleural effusions. No pneumothorax. No pulmonary nodule or mass noted. Pulmonary vasculature and the cardiomediastinal silhouette are within normal limits. Atherosclerosis in the thoracic aorta. IMPRESSION: 1. No radiographic evidence of acute cardiopulmonary disease. 2. Emphysema. 3. Status post left lower lobectomy. 4. Aortic atherosclerosis. Electronically Signed   By: Vinnie Langton M.D.   On: 12/28/2020 14:49   ECHOCARDIOGRAM COMPLETE  Result Date: 12/29/2020    ECHOCARDIOGRAM REPORT   Patient Name:   William ADKISON [name] Date of Exam: 12/29/2020 Medical Rec #:  [id number]         Height:       75.0 in Accession #:    [id number]        Weight:       143.0 lb Date of Birth:  Jul 12, 1955         BSA:          1.903 m Patient Age:    30 years          BP:           112/63 mmHg Patient Gender: M                 HR:           60 bpm. Exam Location:  Inpatient Procedure: 2D Echo, Cardiac Doppler and Color Doppler Indications:    [place] respiratory distress R06.03  History:        Patient has prior history of Echocardiogram examinations, most                 recent 06/08/2020. Previous Myocardial Infarction; Risk                 Factors:Hypertension.  Sonographer:    Bernadene Person RDCS Referring Phys: [id number] [place]  1. Left ventricular ejection fraction, by  estimation, is 60 to 65%. The left ventricle has normal function. The left ventricle has no regional wall motion abnormalities. There is severe asymmetric left ventricular hypertrophy of the basal-septal segment. Left ventricular diastolic parameters are indeterminate.  2. Right ventricular systolic function is normal. The right ventricular size is normal. There is moderately elevated pulmonary artery systolic pressure. The estimated right ventricular systolic pressure is 75.9 mmHg.  3. The aortic valve is normal in structure. Aortic valve regurgitation is trivial. No aortic stenosis is present.  4. Aortic dilatation noted. There is dilatation of the aortic root, measuring 45 mm. There is dilatation of the ascending aorta, measuring 42 mm.  5. The mitral valve is myxomatous. Moderate to severe mitral valve regurgitation. Significant thickening and prolapse of anterior leaflet causing eccentric posterior directed MR jet. Difficult to quantify MR given eccentric jet, but no LA/LV dilatation and E-wave velocity less than 1.2 m/s argues against severe MR. Would consider TEE or cardiac MRI to quantify MR severity FINDINGS  Left Ventricle: Left ventricular ejection fraction, by estimation, is 60 to 65%. The left ventricle has normal function. The left ventricle has no regional wall motion  abnormalities. The left ventricular internal cavity size was normal in size. There is  severe asymmetric left ventricular hypertrophy of the basal-septal segment. Left ventricular diastolic parameters are indeterminate. Right Ventricle: The right ventricular size is normal. No increase in right ventricular wall thickness. Right ventricular systolic function is normal. There is moderately elevated pulmonary artery systolic pressure. The tricuspid regurgitant velocity is 3.07 m/s, and with an assumed right atrial pressure of 15 mmHg, the estimated right ventricular systolic pressure is 21.1 mmHg. Left Atrium: Left atrial size was normal in  size. Right Atrium: Right atrial size was normal in size. Pericardium: Trivial pericardial effusion is present. Mitral Valve: The mitral valve is myxomatous. There is severe thickening of the mitral valve leaflet(s). Moderate to severe mitral valve regurgitation. Tricuspid Valve: The tricuspid valve is normal in structure. Tricuspid valve regurgitation is trivial. Aortic Valve: The aortic valve is normal in structure. Aortic valve regurgitation is trivial. No aortic stenosis is present. Pulmonic Valve: The pulmonic valve was grossly normal. Pulmonic valve regurgitation is trivial. Aorta: Aortic dilatation noted. There is dilatation of the aortic root, measuring 45 mm. There is dilatation of the ascending aorta, measuring 42 mm. IAS/Shunts: The interatrial septum was not well visualized.  LEFT VENTRICLE PLAX 2D LVIDd:         5.00 cm  Diastology LVIDs:         2.90 cm  LV e' medial:    6.75 cm/s LV PW:         1.10 cm  LV E/e' medial:  16.0 LV IVS:        1.30 cm  LV e' lateral:   9.45 cm/s LVOT diam:     2.60 cm  LV E/e' lateral: 11.4 LV SV:         101 LV SV Index:   53 LVOT Area:     5.31 cm  RIGHT VENTRICLE RV S prime:     12.90 cm/s TAPSE (M-mode): 2.3 cm LEFT ATRIUM             Index       RIGHT ATRIUM           Index LA diam:        2.20 cm 1.16 cm/m  RA Area:     19.40 cm LA Vol (A2C):   45.3 ml 23.81 ml/m RA Volume:   51.00 ml  26.80 ml/m LA Vol (A4C):   38.7 ml 20.34 ml/m LA Biplane Vol: 46.3 ml 24.33 ml/m  AORTIC VALVE LVOT Vmax:   97.30 cm/s LVOT Vmean:  60.400 cm/s LVOT VTI:    0.191 m  AORTA Ao Root diam: 4.50 cm Ao Asc diam:  4.20 cm MITRAL VALVE                TRICUSPID VALVE MV Area (PHT): 4.68 cm     TR Peak grad:   37.7 mmHg MV Decel Time: 162 msec     TR Vmax:        307.00 cm/s MR PISA:        1.57 cm MR PISA Radius: 0.50 cm     SHUNTS MV E velocity: 108.00 cm/s  Systemic VTI:  0.19 m MV A velocity: 85.90 cm/s   Systemic Diam: 2.60 cm MV E/A ratio:  1.26 Oswaldo Milian MD  Electronically signed by Oswaldo Milian MD Signature Date/Time: 12/29/2020/6:38:01 PM    Final    (Echo, Carotid, EGD, Colonoscopy, ERCP)    Subjective:  Feels well  No  c/o  Anxious to go home  Discharge Exam: Vitals:   12/29/20 1900 12/30/20 0446  BP: 125/68 133/72  Pulse: 62 68  Resp:  18  Temp: 98.1 F (36.7 C) 98.4 F (36.9 C)  SpO2: 93% 94%   Vitals:   12/29/20 0741 12/29/20 1133 12/29/20 1900 12/30/20 0446  BP: 112/63 108/63 125/68 133/72  Pulse: 70 77 62 68  Resp: 16   18  Temp: 98.8 F (37.1 C) 99.1 F (37.3 C) 98.1 F (36.7 C) 98.4 F (36.9 C)  TempSrc: Oral Oral Oral Oral  SpO2: 96% 97% 93% 94%  Weight:      Height:        General: Pt is alert, awake, not in acute distress Cardiovascular: RRR, S1/S2 +, no rubs, no gallops Respiratory: CTA bilaterally, no wheezing, no rhonchi Abdominal: Soft, NT, ND, bowel sounds + Extremities: no edema, no cyanosis    The results of significant diagnostics from this hospitalization (including imaging, microbiology, ancillary and laboratory) are listed below for reference.     Microbiology: Recent Results (from the past 240 hour(s))  Resp Panel by RT-PCR (Flu A&B, Covid) Nasopharyngeal Swab     Status: None   Collection Time: 12/28/20  3:31 PM   Specimen: Nasopharyngeal Swab; Nasopharyngeal(NP) swabs in vial transport medium  Result Value Ref Range Status   SARS Coronavirus 2 by RT PCR NEGATIVE NEGATIVE Final    Comment: (NOTE) SARS-CoV-2 target nucleic acids are NOT DETECTED.  The SARS-CoV-2 RNA is generally detectable in upper respiratory specimens during the acute phase of infection. The lowest concentration of SARS-CoV-2 viral copies this assay can detect is 138 copies/mL. A negative result does not preclude SARS-Cov-2 infection and should not be used as the sole basis for treatment or other patient management decisions. A negative result may occur with  improper specimen collection/handling, submission  of specimen other than nasopharyngeal swab, presence of viral mutation(s) within the areas targeted by this assay, and inadequate number of viral copies(<138 copies/mL). A negative result must be combined with clinical observations, patient history, and epidemiological information. The expected result is Negative.  Fact Sheet for Patients:  EntrepreneurPulse.com.au  Fact Sheet for Healthcare Providers:  IncredibleEmployment.be  This test is no t yet approved or cleared by the [place] FDA and  has been authorized for detection and/or diagnosis of SARS-CoV-2 by FDA under an Emergency Use Authorization (EUA). This EUA will remain  in effect (meaning this test can be used) for the duration of the COVID-19 declaration under Section 564(b)(1) of the Act, 21 U.S.C.section 360bbb-3(b)(1), unless the authorization is terminated  or revoked sooner.       Influenza A by PCR NEGATIVE NEGATIVE Final   Influenza B by PCR NEGATIVE NEGATIVE Final    Comment: (NOTE) The Xpert Xpress SARS-CoV-2/FLU/RSV plus assay is intended as an aid in the diagnosis of influenza from Nasopharyngeal swab specimens and should not be used as a sole basis for treatment. Nasal washings and aspirates are unacceptable for Xpert Xpress SARS-CoV-2/FLU/RSV testing.  Fact Sheet for Patients: EntrepreneurPulse.com.au  Fact Sheet for Healthcare Providers: IncredibleEmployment.be  This test is not yet approved or cleared by the [place] FDA and has been authorized for detection and/or diagnosis of SARS-CoV-2 by FDA under an Emergency Use Authorization (EUA). This EUA will remain in effect (meaning this test can be used) for the duration of the COVID-19 declaration under Section 564(b)(1) of the Act, 21 U.S.C. section 360bbb-3(b)(1), unless the authorization is terminated or revoked.  Performed at [facility], [place]  [address], [place], [place] [address]   Culture, blood (routine x 2)     Status: None (Preliminary result)   Collection Time: 12/28/20  5:12 PM   Specimen: BLOOD  Result Value Ref Range Status   Specimen Description   Final    BLOOD PORTA CATH Performed at [facility] [address] [place], [place] [address]    Special Requests   Final    BOTTLES DRAWN AEROBIC AND ANAEROBIC Blood Culture adequate volume Performed at [facility] [address] [place], [place] [address]    Culture   Final    NO GROWTH 2 DAYS Performed at [facility] [address], [place], [place] [address]    Report Status PENDING  Incomplete  Culture, blood (routine x 2)     Status: None (Preliminary result)   Collection Time: 12/28/20  6:28 PM   Specimen: BLOOD  Result Value Ref Range Status   Specimen Description   Final    BLOOD LEFT ARM UPPER Performed at [facility] [address], [place], [place] [address]    Special Requests   Final    BOTTLES DRAWN AEROBIC AND ANAEROBIC Blood Culture adequate volume Performed at [facility] [address], [place], [place] [address]    Culture   Final    NO GROWTH 2 DAYS Performed at [facility] [address], [place], [place] [address]    Report Status PENDING  Incomplete  MRSA Next Gen by PCR, Nasal     Status: None   Collection Time: 12/28/20  9:00 PM   Specimen: Nasal Mucosa; Nasal Swab  Result Value Ref Range Status   MRSA by PCR Next Gen NOT DETECTED NOT DETECTED Final    Comment: (NOTE) The GeneXpert MRSA Assay (FDA approved for NASAL specimens only), is one component of a comprehensive MRSA colonization surveillance program. It is not intended to diagnose MRSA infection nor to guide or monitor treatment for MRSA infections. Test performance is not FDA approved in patients less than 52 years old. Performed at [facility], [place]  [address] [place], [place] [address]      Labs: BNP (last 3 results) No results for input(s): BNP in the last 8760 hours. Basic Metabolic Panel: Recent Labs  Lab 12/27/20 1056 12/28/20 1512 12/29/20 0437 12/30/20 0803  NA 138 138 140 136  K 4.1 4.6 4.7 4.3  CL 106 110 115* 111  CO2 21* 21* 19* 20*  GLUCOSE 76 86 88 88  BUN 27* 39* 36* 30*  CREATININE 1.82* 2.43* 2.03* 1.72*  CALCIUM 8.9 9.6 8.1* 7.9*   Liver Function Tests: Recent Labs  Lab 12/27/20 1056 12/28/20 1512 12/29/20 0437 12/30/20 0803  AST 73* 69* 46* 35  ALT 121* 111* 75* 60*  ALKPHOS 295* 289* 202* 185*  BILITOT 0.9 1.3* 1.1 0.8  PROT 6.5 7.2 5.5* 5.4*  ALBUMIN 2.9* 3.4* 2.6* 2.7*   Recent Labs  Lab 12/28/20 1512  LIPASE 84*   No results for input(s): AMMONIA in the last 168 hours. CBC: Recent Labs  Lab 12/27/20 1056 12/28/20 1512 12/30/20 0803  WBC 12.0* 16.0* 11.3*  NEUTROABS 9.5* 14.0*  --   HGB 13.0 14.4 10.9*  HCT 39.6 45.5 36.0*  MCV 89.8 91.7 96.8  PLT 223 201 153   Cardiac Enzymes: No results for input(s): CKTOTAL, CKMB, CKMBINDEX, TROPONINI in the last  168 hours. BNP: Invalid input(s): POCBNP CBG: No results for input(s): GLUCAP in the last 168 hours. D-Dimer No results for input(s): DDIMER in the last 72 hours. Hgb A1c No results for input(s): HGBA1C in the last 72 hours. Lipid Profile No results for input(s): CHOL, HDL, LDLCALC, TRIG, CHOLHDL, LDLDIRECT in the last 72 hours. Thyroid function studies No results for input(s): TSH, T4TOTAL, T3FREE, THYROIDAB in the last 72 hours.  Invalid input(s): FREET3 Anemia work up No results for input(s): VITAMINB12, FOLATE, FERRITIN, TIBC, IRON, RETICCTPCT in the last 72 hours. Urinalysis    Component Value Date/Time   COLORURINE YELLOW 05/17/2016 1153   APPEARANCEUR HAZY (A) 05/17/2016 1153   LABSPEC 1.017 05/17/2016 1153   PHURINE 6.0 05/17/2016 1153   GLUCOSEU NEGATIVE 05/17/2016 1153   HGBUR SMALL (A) 05/17/2016 1153    BILIRUBINUR NEGATIVE 05/17/2016 1153   KETONESUR NEGATIVE 05/17/2016 1153   PROTEINUR NEGATIVE 05/17/2016 1153   NITRITE POSITIVE (A) 05/17/2016 1153   LEUKOCYTESUR LARGE (A) 05/17/2016 1153   Sepsis Labs Invalid input(s): PROCALCITONIN,  WBC,  LACTICIDVEN Microbiology Recent Results (from the past 240 hour(s))  Resp Panel by RT-PCR (Flu A&B, Covid) Nasopharyngeal Swab     Status: None   Collection Time: 12/28/20  3:31 PM   Specimen: Nasopharyngeal Swab; Nasopharyngeal(NP) swabs in vial transport medium  Result Value Ref Range Status   SARS Coronavirus 2 by RT PCR NEGATIVE NEGATIVE Final    Comment: (NOTE) SARS-CoV-2 target nucleic acids are NOT DETECTED.  The SARS-CoV-2 RNA is generally detectable in upper respiratory specimens during the acute phase of infection. The lowest concentration of SARS-CoV-2 viral copies this assay can detect is 138 copies/mL. A negative result does not preclude SARS-Cov-2 infection and should not be used as the sole basis for treatment or other patient management decisions. A negative result may occur with  improper specimen collection/handling, submission of specimen other than nasopharyngeal swab, presence of viral mutation(s) within the areas targeted by this assay, and inadequate number of viral copies(<138 copies/mL). A negative result must be combined with clinical observations, patient history, and epidemiological information. The expected result is Negative.  Fact Sheet for Patients:  EntrepreneurPulse.com.au  Fact Sheet for Healthcare Providers:  IncredibleEmployment.be  This test is no t yet approved or cleared by the [place] FDA and  has been authorized for detection and/or diagnosis of SARS-CoV-2 by FDA under an Emergency Use Authorization (EUA). This EUA will remain  in effect (meaning this test can be used) for the duration of the COVID-19 declaration under Section 564(b)(1) of the Act,  21 U.S.C.section 360bbb-3(b)(1), unless the authorization is terminated  or revoked sooner.       Influenza A by PCR NEGATIVE NEGATIVE Final   Influenza B by PCR NEGATIVE NEGATIVE Final    Comment: (NOTE) The Xpert Xpress SARS-CoV-2/FLU/RSV plus assay is intended as an aid in the diagnosis of influenza from Nasopharyngeal swab specimens and should not be used as a sole basis for treatment. Nasal washings and aspirates are unacceptable for Xpert Xpress SARS-CoV-2/FLU/RSV testing.  Fact Sheet for Patients: EntrepreneurPulse.com.au  Fact Sheet for Healthcare Providers: IncredibleEmployment.be  This test is not yet approved or cleared by the [place] FDA and has been authorized for detection and/or diagnosis of SARS-CoV-2 by FDA under an Emergency Use Authorization (EUA). This EUA will remain in effect (meaning this test can be used) for the duration of the COVID-19 declaration under Section 564(b)(1) of the Act, 21 U.S.C. section 360bbb-3(b)(1), unless the authorization is  terminated or revoked.  Performed at [facility], [place] [address], [place], [place] [address]   Culture, blood (routine x 2)     Status: None (Preliminary result)   Collection Time: 12/28/20  5:12 PM   Specimen: BLOOD  Result Value Ref Range Status   Specimen Description   Final    BLOOD PORTA CATH Performed at [facility] [address] [place], [place] [address]    Special Requests   Final    BOTTLES DRAWN AEROBIC AND ANAEROBIC Blood Culture adequate volume Performed at [facility] [address] [place], [place] [address]    Culture   Final    NO GROWTH 2 DAYS Performed at [facility] [address] [place], [place] [address]    Report Status PENDING  Incomplete  Culture, blood (routine x 2)     Status: None (Preliminary result)   Collection Time: 12/28/20  6:28 PM   Specimen: BLOOD   Result Value Ref Range Status   Specimen Description   Final    BLOOD LEFT ARM UPPER Performed at [facility] [address] [place], [place] [address]    Special Requests   Final    BOTTLES DRAWN AEROBIC AND ANAEROBIC Blood Culture adequate volume Performed at [facility] [address] [place], [place] [address]    Culture   Final    NO GROWTH 2 DAYS Performed at [facility] [address] [place], [place] [address]    Report Status PENDING  Incomplete  MRSA Next Gen by PCR, Nasal     Status: None   Collection Time: 12/28/20  9:00 PM   Specimen: Nasal Mucosa; Nasal Swab  Result Value Ref Range Status   MRSA by PCR Next Gen NOT DETECTED NOT DETECTED Final    Comment: (NOTE) The GeneXpert MRSA Assay (FDA approved for NASAL specimens only), is one component of a comprehensive MRSA colonization surveillance program. It is not intended to diagnose MRSA infection nor to guide or monitor treatment for MRSA infections. Test performance is not FDA approved in patients less than 57 years old. Performed at [facility], [place] [address], [place], [place] [address]      Time coordinating discharge: Over 30 minutes  SIGNED:   Georgette Shell, MD  [facility] 12/30/2020, 12:16 PM

## 2020-12-30 NOTE — TOC Transition Note (Signed)
Transition of Care [facility]) - CM/SW Discharge Note   Patient Details  Name: William WINT [name] MRN: [id number] Date of Birth: [birth year]  [facility] CM/SW Contact:  Ross Ludwig, LCSW Phone Number: 12/30/2020, 12:41 PM   Clinical Narrative:    CSW was informed that patient needs oxygen.  Oxygen set up through [facility] and will be delivered to room before he discharges.  CSW spoke to patient and his daughter, and they would like [facility], CSW requested orders.  Patient and family did not have any preference for agencies.  CSW spoke to [facility] and they can accept patient.  Patient will be going home with home health PT through [facility].  CSW signing off please reconsult with any other social work needs, home health agency has been notified of planned discharge.   Final next level of care: [place] Barriers to Discharge: Barriers Resolved   Patient Goals and CMS Choice Patient states their goals for this hospitalization and ongoing recovery are:: To return back home with his wife. CMS Medicare.gov Compare Post Acute Care list provided to:: Patient Choice offered to / list presented to : Patient, Adult Children  Discharge Placement                       Discharge Plan and Services                DME Arranged: Oxygen DME Agency: [employer] Date DME Agency Contacted: 12/30/20 Time DME Agency Contacted: 1215 Representative spoke with at DME Agency: [place]: PT [place]: [place] Date [place]: 12/30/20 Time [place]: 22 Representative spoke with at [place]: [place] ([place]) Interventions     Readmission Risk Interventions No flowsheet data found.

## 2020-12-30 NOTE — Care Plan (Signed)
SATURATION QUALIFICATIONS: (This note is used to comply with regulatory documentation for home oxygen)  Patient Saturations on Room Air at Rest = 92%  Patient Saturations on Room Air while Ambulating = 78%  Patient Saturations on 2 Liters of oxygen while Ambulating = 90%  Please briefly explain why patient needs home oxygen: needs while ambulating/active

## 2020-12-30 NOTE — Progress Notes (Signed)
[name] OFFICE PROGRESS NOTE  [name], [address] [place] [id number]  DIAGNOSIS: Metastatic non-small cell lung cancer, adenocarcinoma initially diagnosed as stage IB (T2a, N0, M0) non-small cell lung cancer, adenocarcinoma diagnosed in December 2017.  The patient has evidence for disease recurrence in July 2019.   Biomarker Findings Tumor Mutational Burden - TMB-Intermediate (16 Muts/Mb) Microsatellite status - MS-Stable Genomic Findings For a complete list of the genes assayed, please refer to the Appendix. KIT amplification KRAS Q11H SMAD4 splice site 4174-0C>X KG81 I232F 7 Disease relevant genes with no reportable alterations: EGFR, ALK, BRAF, MET, RET, ERBB2, ROS1    PDL 1 expression is 0%  PRIOR THERAPY: 1) status post left lower lobectomy as well as wedge resection of the left upper lobe on 05/11/2016. 2) Adjuvant systemic chemotherapy with cisplatin 75 MG/M2 and Alimta 500 MG/M2 every 3 weeks. First dose 07/03/2016. Status post 4 cycles. 3) Systemic chemotherapy with carboplatin for AUC of 5, Alimta 500 mg/M2 and Keytruda 200 mg IV every 3 weeks.  Status post 47 cycles.  Starting from cycle #5 the patient will be treated with maintenance Alimta and Ketruda (pembrolizumab) every 3 weeks.  This treatment was discontinued secondary to disease progression.  CURRENT THERAPY: Lumakras (Sotorasib) 960 mg p.o. daily started October 29, 2020.  Status post 2 months of treatment. Treatment on hold due to liver dysfunction.   INTERVAL HISTORY: William Kales [name] 65 y.o. male returns to the clinic today with his daughter for a follow up visit. The patient has no current concerns or complaints. He was last seen in the clinic on 12/13/20. At that time, he was endorsing generalized weakness and fatigue. He also had some liver dysfunction. Therefore, his treatment with lumakras has been on hold. He was started on a high dose prednisone taper, which he finished  yesterday 01/05/21.   In the interval since his last appointment, the patient went to have a restaging CT scan on 12/28/20. The patient was endorsing weakness and diarrhea that he believes was from drinking the contrast. He went to the ED and they found him to be hypotensive, tachycardic, and febrile. He was admitted from 9/7-9/9 and was found to have an AKI because of hypotension/dehydration. He received IV Abx and IVF.  Today he reports feeling that he is getting back to his baseline following the hospitalization, states his weakness has improved. He denies any further diarrhea or abdominal pain. He denies nausea, vomiting, constipation. He does report that in the interval and especially since the CT scan on 9/7 his SOB with exertion has worsened. He reports that he was discharged with home oxygen but he has not been using it.  He denies any cough, hemoptysis or chest pain. He sees cardiology for his mitral regurgitation. He sees them next week for a repeat EKG on Tuesday. Denies any more fevers, chills, or night sweats. He denies weight loss and reports improved appetite. Denies headaches or visual changes. He is here for evaluation and repeat blood work.     MEDICAL HISTORY: Past Medical History:  Diagnosis Date   AAA (abdominal aortic aneurysm) ([place])    AAA (abdominal aortic aneurysm) without rupture ([place]) 02/04/2014   Cancer ([place])    lung   Encounter for antineoplastic chemotherapy 06/06/2016   History of hepatitis B    HNP (herniated nucleus pulposus), lumbar    L4 with radiculopathy   Hypertension    Lung cancer ([place]) 05/18/2016   Malignant  neoplasm of lower lobe of left lung ([place]) 04/05/2016   Mass of left lung    Mitral insufficiency 04/06/2016   This patient will eventually need MV repair if his prognosis is good from oncology standpoint. However, his lung cancer therapy  is the priority right now. His cardiac condition won't preclude possible lung surgery.  Once his lung cancer is  under control he will need a TEE to further evaluate his mitral valve anatomy and MR severity, and also have ischemic workup as tere is evidence on calcificati   Renal insufficiency 10/09/2016   S/P partial lobectomy of lung 05/11/2016   Varicose veins of legs     ALLERGIES:  is allergic to tramadol.  MEDICATIONS:  Current Outpatient Medications  Medication Sig Dispense Refill   loperamide (IMODIUM A-D) 2 MG tablet Take 2 mg by mouth 4 (four) times daily as needed for diarrhea or loose stools.     predniSONE (DELTASONE) 10 MG tablet 6 tablets p.o. daily for 1 week followed by 4 tablet p.o. daily for 1 week followed by 2 tablet p.o. daily for 1 week followed by 1 tablet p.o. daily for 1 week then stop. (Patient taking differently: Take 10-40 mg by mouth See admin instructions. 75m daily for 2 weeks followed by 270mdaily for 1 week followed by 1077maily for 1 week then consult MD) 91 tablet 0   No current facility-administered medications for this visit.    SURGICAL HISTORY:  Past Surgical History:  Procedure Laterality Date   ABDOMINAL AORTIC ENDOVASCULAR STENT GRAFT N/A 03/12/2016   Procedure: ABDOMINAL AORTIC ENDOVASCULAR STENT GRAFT;  Surgeon: BriConrad BurlingtonD;  [facility];  Laterality: N/A;   COLONOSCOPY     INGUINAL HERNIA REPAIR Left 1975   Left inguinal hernia   INGUINAL HERNIA REPAIR Right    IR IMAGING GUIDED PORT INSERTION  09/04/2019   LOBECTOMY Left 05/11/2016   Procedure: LEFT LOWER LOBE LOBECTOMY AND LEFT UPPER LOBE  RESECTION AND PLACEMENT OF ON-Q;  Surgeon: EdwGrace IsaacD;  [facility];  Laterality: Left;   LUMBAR LAMINECTOMY  October 22, 2012   LYMPH NODE DISSECTION Left 05/11/2016   Procedure: LYMPH NODE DISSECTION;  Surgeon: EdwGrace IsaacD;  [facility];  Laterality: Left;   RIGHT/LEFT HEART CATH AND CORONARY ANGIOGRAPHY N/A 03/10/2020   Procedure: RIGHT/LEFT HEART CATH AND CORONARY  ANGIOGRAPHY;  Surgeon: CooSherren MochaD;  [facility];  Service: Cardiovascular;  Laterality: N/A;   STAPLING OF BLEBS Left 05/11/2016   Procedure: STAPLING OF APICAL BLEB;  Surgeon: EdwGrace IsaacD;  [facility];  Laterality: Left;   TEE WITHOUT CARDIOVERSION N/A 10/07/2019   Procedure: TRANSESOPHAGEAL ECHOCARDIOGRAM (TEE);  Surgeon: NelDorothy SparkD;  [facility];  Service: Cardi[facility];  Laterality: N/A;   VIDEO ASSISTED THORACOSCOPY Left 05/18/2016   Procedure: VIDEO ASSISTED THORACOSCOPY WITH REMOVAL OF LEFT APICAL BLEB;  Surgeon: EdwGrace IsaacD;  [facility];  Laterality: Left;   VIDEO ASSISTED THORACOSCOPY (VATS)/WEDGE RESECTION Left 05/11/2016   Procedure: LEFT VIDEO ASSISTED THORACOSCOPY (VATS);  Surgeon: EdwGrace IsaacD;  [facility];  Laterality: Left;   VIDEO BRONCHOSCOPY N/A 05/11/2016   Procedure: VIDEO BRONCHOSCOPY, LEFT LUNG;  Surgeon: EdwGrace IsaacD;  [facility];  Laterality: N/A;   VIDEO BRONCHOSCOPY N/A 05/18/2016  Procedure: VIDEO BRONCHOSCOPY WITH BRONCHIAL WASHING;  Surgeon: [name], MD;  Location[facility] OR;  Service: Thoracic;  Laterality: N/A;    REVIEW OF SYSTEMS:   Review of Systems  Constitutional: Positive for mild fatigue. Negative for appetite change, chills, fever and unexpected weight change.  HENT: Negative for mouth sores, nosebleeds, sore throat and trouble swallowing.   Eyes: Negative for eye problems and icterus.  Respiratory: Positive for shortness of breath with exertion. Negative for cough, hemoptysis, and wheezing.   Cardiovascular: Negative for chest pain and leg swelling.  Gastrointestinal: Negative for abdominal pain, constipation, diarrhea, nausea and vomiting.  Genitourinary: Negative for bladder incontinence, difficulty urinating, dysuria, frequency and hematuria.   Musculoskeletal:  Negative for back pain, gait problem, neck pain and neck stiffness.  Skin: Negative for itching and rash.  Neurological: Negative for dizziness, extremity weakness, gait problem, headaches, light-headedness and seizures.  Hematological: Negative for adenopathy. Does not bruise/bleed easily.  Psychiatric/Behavioral: Negative for confusion, depression and sleep disturbance. The patient is not nervous/anxious.     PHYSICAL EXAMINATION:  Blood pressure 128/88, pulse (!) 119, temperature (!) 97.1 F (36.2 C), temperature source Tympanic, resp. rate 18, weight 139 lb 3 oz (63.1 kg), SpO2 90 %.  ECOG PERFORMANCE STATUS: 1  Physical Exam  Constitutional: Oriented to person, place, and time and thin appearing male, and in no distress. HENT: Head: Normocephalic and atraumatic. Mouth/Throat: Oropharynx is clear and moist. No oropharyngeal exudate. Eyes: Conjunctivae are normal. Right eye exhibits no discharge. Left eye exhibits no discharge. No scleral icterus. Neck: Normal range of motion. Neck supple. Cardiovascular: Systolic murmur auscultated in his posterior left thorax. Tachycardia, regular rhythm and intact distal pulses.   Pulmonary/Chest: Effort normal and breath sounds normal. No respiratory distress. No wheezes. No rales. Abdominal: Soft. Bowel sounds are normal. Exhibits no distension and no mass. There is no tenderness.  Musculoskeletal: Normal range of motion. Exhibits no edema.  Lymphadenopathy:    No cervical adenopathy.  Neurological: Alert and oriented to person, place, and time. Exhibits normal muscle tone. Gait normal. Coordination normal. Skin: Skin is warm and dry. No rash noted. Not diaphoretic. No erythema. No pallor.  Psychiatric: Mood, memory and judgment normal. Vitals reviewed  LABORATORY DATA: Lab Results  Component Value Date   WBC 11.3 (H) 12/30/2020   HGB 10.9 (L) 12/30/2020   HCT 36.0 (L) 12/30/2020   MCV 96.8 12/30/2020   PLT 153 12/30/2020       Chemistry      Component Value Date/Time   NA 136 12/30/2020 0803   NA 132 (L) 10/05/2019 0830   NA 138 01/11/2017 1343   K 4.3 12/30/2020 0803   K 4.7 01/11/2017 1343   CL 111 12/30/2020 0803   CO2 20 (L) 12/30/2020 0803   CO2 23 01/11/2017 1343   BUN 30 (H) 12/30/2020 0803   BUN 14 10/05/2019 0830   BUN 20.2 01/11/2017 1343   CREATININE 1.72 (H) 12/30/2020 0803   CREATININE 1.82 (H) 12/27/2020 1056   CREATININE 1.6 (H) 01/11/2017 1343      Component Value Date/Time   CALCIUM 7.9 (L) 12/30/2020 0803   CALCIUM 9.3 01/11/2017 1343   ALKPHOS 185 (H) 12/30/2020 0803   ALKPHOS 89 01/11/2017 1343   AST 35 12/30/2020 0803   AST 73 (H) 12/27/2020 1056   AST 41 (H) 01/11/2017 1343   ALT 60 (H) 12/30/2020 0803   ALT 121 (H) 12/27/2020 1056   ALT 27 01/11/2017 1343   BILITOT 0.8  12/30/2020 0803   BILITOT 0.9 12/27/2020 1056   BILITOT 0.49 01/11/2017 1343       RADIOGRAPHIC STUDIES:  CT Abdomen Pelvis Wo Contrast  Result Date: 12/28/2020 CLINICAL DATA:  Primary Cancer Type: Lung Imaging Indication: Assess response to therapy Interval therapy since last imaging? Yes Initial Cancer Diagnosis Date: 03/29/2016; Established by: Biopsy-proven Detailed Pathology: Stage Ib non-small cell lung cancer, adenocarcinoma. Primary Tumor location:   Left lower lobe. Recurrence? Yes; Date(s) of recurrence: 12/02/2017; Established by: Biopsy-proven; metastasis to T4. Surgeries: Left lower lobectomy and left upper lobe wedge resection, with stapling of apical bleb, 05/11/2016. AAA stent graft 2017. Chemotherapy: Yes; Ongoing? No; Most recent administration: 06/30/2020 Immunotherapy?  Yes; Type: Keytruda; Ongoing? No Radiation therapy? No Other Cancer Therapies: Lumakras daily. EXAM: CT CHEST, ABDOMEN AND PELVIS WITHOUT CONTRAST TECHNIQUE: Multidetector CT imaging of the chest, abdomen and pelvis was performed following the standard protocol without IV contrast. COMPARISON:  Most recent CT chest, abdomen and  pelvis 10/17/2020. 11/12/2017 PET-CT. FINDINGS: CT CHEST FINDINGS Cardiovascular: Right chest port catheter. Aortic atherosclerosis. 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 Normal heart size. No pericardial effusion. Mediastinum/Nodes: No enlarged mediastinal, hilar, or axillary lymph nodes. Small hiatal hernia. Thyroid gland, trachea, and esophagus demonstrate no significant findings. Lungs/Pleura: Moderate centrilobular and paraseptal emphysema. Redemonstrated postoperative findings of left lower lobectomy and posterior left upper lobe wedge resection. Irregular nodular opacities seen on prior examination are diminished or resolved compared to prior examination, an index nodule of the anterior right upper lobe measuring no greater than 0.5 x 0.3 cm, previously 0.8 x 0.6 cm (series 6, image 77) and an index nodule of the peripheral left upper lobe measuring 0.7 x 0 point 4 cm, previously 1.0 x 0.5 cm (series 6, image 75). Pleural thickening and calcification of the dependent right pleura. Trace, chronic, loculated left pleural effusion. Musculoskeletal: No chest wall mass. Unchanged, mildly expansile sclerotic lesion of the spinous process of T4 (series 6, image 45). CT ABDOMEN PELVIS FINDINGS Hepatobiliary: No solid liver abnormality is seen. No gallstones, gallbladder wall thickening, or biliary dilatation. Pancreas: Unremarkable. No pancreatic ductal dilatation or surrounding inflammatory changes. Spleen: Normal in size without significant abnormality. Adrenals/Urinary Tract: Adrenal glands are unremarkable. Kidneys are normal, without renal calculi, solid lesion, or hydronephrosis. Bladder is unremarkable. Stomach/Bowel: Stomach is within normal limits. Appendix appears normal. No evidence of bowel wall thickening, distention, or inflammatory changes. Vascular/Lymphatic: Aortic atherosclerosis. Status post aortobiiliac stent endograft repair. No enlarged  abdominal or pelvic lymph nodes. Reproductive: No mass or other abnormality. Other: No abdominal wall hernia or abnormality. No abdominopelvic ascites. Musculoskeletal: No acute or significant osseous findings. IMPRESSION: 1. Redemonstrated postoperative findings of left lower lobectomy and posterior left upper lobe wedge resection. 2. Irregular nodular opacities seen on prior examination are diminished or resolved compared to prior examination, most consistent with treatment response of pulmonary metastatic disease. Resolving infection or inflammation remain differential considerations. 3. No noncontrast evidence of new metastatic disease in the chest, abdomen, or pelvis. 4. Unchanged, mildly expansile sclerotic lesion of the spinous process of T4, of uncertain significance. No additional findings to suggest osseous metastatic disease. 5. 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 Recommend annual imaging followup by CTA or MRA. This recommendation follows 2010 ACCF/AHA/AATS/ACR/ASA/SCA/SCAI/SIR/STS/SVM Guidelines for the Diagnosis and Management of Patients with Thoracic Aortic Disease. Circulation. 2010; 121: X517-G017. Aortic aneurysm NOS (ICD10-I71.9) 6. Emphysema  and diffuse bilateral bronchial wall thickening. 7. Status post aortobiiliac stent endograft repair. Aortic Atherosclerosis (ICD10-I70.0) and Emphysema (ICD10-J43.9). Electronically Signed   By: Eddie Candle M.D.   On: 12/28/2020 11:01   CT Chest Wo Contrast  Result Date: 12/28/2020 CLINICAL DATA:  Primary Cancer Type: Lung Imaging Indication: Assess response to therapy Interval therapy since last imaging? Yes Initial Cancer Diagnosis Date: 03/29/2016; Established by: Biopsy-proven Detailed Pathology: Stage Ib non-small cell lung cancer, adenocarcinoma. Primary Tumor location:   Left lower lobe. Recurrence? Yes; Date(s) of recurrence: 12/02/2017; Established by: Biopsy-proven;  metastasis to T4. Surgeries: Left lower lobectomy and left upper lobe wedge resection, with stapling of apical bleb, 05/11/2016. AAA stent graft 2017. Chemotherapy: Yes; Ongoing? No; Most recent administration: 06/30/2020 Immunotherapy?  Yes; Type: Keytruda; Ongoing? No Radiation therapy? No Other Cancer Therapies: Lumakras daily. EXAM: CT CHEST, ABDOMEN AND PELVIS WITHOUT CONTRAST TECHNIQUE: Multidetector CT imaging of the chest, abdomen and pelvis was performed following the standard protocol without IV contrast. COMPARISON:  Most recent CT chest, abdomen and pelvis 10/17/2020. 11/12/2017 PET-CT. FINDINGS: CT CHEST FINDINGS Cardiovascular: Right chest port catheter. Aortic atherosclerosis. 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 Normal heart size. No pericardial effusion. Mediastinum/Nodes: No enlarged mediastinal, hilar, or axillary lymph nodes. Small hiatal hernia. Thyroid gland, trachea, and esophagus demonstrate no significant findings. Lungs/Pleura: Moderate centrilobular and paraseptal emphysema. Redemonstrated postoperative findings of left lower lobectomy and posterior left upper lobe wedge resection. Irregular nodular opacities seen on prior examination are diminished or resolved compared to prior examination, an index nodule of the anterior right upper lobe measuring no greater than 0.5 x 0.3 cm, previously 0.8 x 0.6 cm (series 6, image 77) and an index nodule of the peripheral left upper lobe measuring 0.7 x 0 point 4 cm, previously 1.0 x 0.5 cm (series 6, image 75). Pleural thickening and calcification of the dependent right pleura. Trace, chronic, loculated left pleural effusion. Musculoskeletal: No chest wall mass. Unchanged, mildly expansile sclerotic lesion of the spinous process of T4 (series 6, image 45). CT ABDOMEN PELVIS FINDINGS Hepatobiliary: No solid liver abnormality is seen. No gallstones, gallbladder wall thickening, or biliary  dilatation. Pancreas: Unremarkable. No pancreatic ductal dilatation or surrounding inflammatory changes. Spleen: Normal in size without significant abnormality. Adrenals/Urinary Tract: Adrenal glands are unremarkable. Kidneys are normal, without renal calculi, solid lesion, or hydronephrosis. Bladder is unremarkable. Stomach/Bowel: Stomach is within normal limits. Appendix appears normal. No evidence of bowel wall thickening, distention, or inflammatory changes. Vascular/Lymphatic: Aortic atherosclerosis. Status post aortobiiliac stent endograft repair. No enlarged abdominal or pelvic lymph nodes. Reproductive: No mass or other abnormality. Other: No abdominal wall hernia or abnormality. No abdominopelvic ascites. Musculoskeletal: No acute or significant osseous findings. IMPRESSION: 1. Redemonstrated postoperative findings of left lower lobectomy and posterior left upper lobe wedge resection. 2. Irregular nodular opacities seen on prior examination are diminished or resolved compared to prior examination, most consistent with treatment response of pulmonary metastatic disease. Resolving infection or inflammation remain differential considerations. 3. No noncontrast evidence of new metastatic disease in the chest, abdomen, or pelvis. 4. Unchanged, mildly expansile sclerotic lesion of the spinous process of T4, of uncertain significance. No additional findings to suggest osseous metastatic disease. 5. 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 Recommend annual imaging followup by CTA or MRA. This recommendation follows 2010 ACCF/AHA/AATS/ACR/ASA/SCA/SCAI/SIR/STS/SVM Guidelines for the Diagnosis and Management of Patients with Thoracic Aortic  Disease. Circulation. 2010; 121: W263-Z858. Aortic aneurysm NOS (ICD10-I71.9) 6. Emphysema and diffuse bilateral bronchial wall thickening. 7. Status post aortobiiliac stent endograft repair. Aortic  Atherosclerosis (ICD10-I70.0) and Emphysema (ICD10-J43.9). Electronically Signed   By: Eddie Candle M.D.   On: 12/28/2020 11:01   DG Chest Port 1 View  Result Date: 12/28/2020 CLINICAL DATA:  66 year old male with history of shortness of breath. History of left lower lobe lung cancer undergoing treatment. EXAM: PORTABLE CHEST 1 VIEW COMPARISON:  Chest x-ray 11/29/2016. FINDINGS: Right-sided internal jugular single-lumen porta cath with tip terminating at the superior cavoatrial junction. Status post left lower lobectomy with compensatory hyperexpansion of the left upper lobe. Lung volumes are normal. No consolidative airspace disease. No pleural effusions. No pneumothorax. No pulmonary nodule or mass noted. Pulmonary vasculature and the cardiomediastinal silhouette are within normal limits. Atherosclerosis in the thoracic aorta. IMPRESSION: 1. No radiographic evidence of acute cardiopulmonary disease. 2. Emphysema. 3. Status post left lower lobectomy. 4. Aortic atherosclerosis. Electronically Signed   By: Vinnie Langton M.D.   On: 12/28/2020 14:49   ECHOCARDIOGRAM COMPLETE  Result Date: 12/29/2020    ECHOCARDIOGRAM REPORT   Patient Name:   William VITOLO [name] Date of Exam: 12/29/2020 Medical Rec #:  [id number]         Height:       75.0 in Accession #:    [id number]        Weight:       143.0 lb Date of Birth:  [birth year]         BSA:          1.903 m Patient Age:    77 years          BP:           112/63 mmHg Patient Gender: M                 HR:           60 bpm. Exam Location:  Inpatient Procedure: 2D Echo, Cardiac Doppler and Color Doppler Indications:    [place] respiratory distress R06.03  History:        Patient has prior history of Echocardiogram examinations, most                 recent 06/08/2020. Previous Myocardial Infarction; Risk                 Factors:Hypertension.  Sonographer:    Bernadene Person RDCS Referring Phys: [id number] [place]  1. Left ventricular ejection fraction, by  estimation, is 60 to 65%. The left ventricle has normal function. The left ventricle has no regional wall motion abnormalities. There is severe asymmetric left ventricular hypertrophy of the basal-septal segment. Left ventricular diastolic parameters are indeterminate.  2. Right ventricular systolic function is normal. The right ventricular size is normal. There is moderately elevated pulmonary artery systolic pressure. The estimated right ventricular systolic pressure is 36.6 mmHg.  3. The aortic valve is normal in structure. Aortic valve regurgitation is trivial. No aortic stenosis is present.  4. Aortic dilatation noted. There is dilatation of the aortic root, measuring 45 mm. There is dilatation of the ascending aorta, measuring 42 mm.  5. The mitral valve is myxomatous. Moderate to severe mitral valve regurgitation. Significant thickening and prolapse of anterior leaflet causing eccentric posterior directed MR jet. Difficult to quantify MR given eccentric jet, but no LA/LV dilatation and E-wave velocity less than 1.2 m/s argues against severe MR. Would consider  TEE or cardiac MRI to quantify MR severity FINDINGS  Left Ventricle: Left ventricular ejection fraction, by estimation, is 60 to 65%. The left ventricle has normal function. The left ventricle has no regional wall motion abnormalities. The left ventricular internal cavity size was normal in size. There is  severe asymmetric left ventricular hypertrophy of the basal-septal segment. Left ventricular diastolic parameters are indeterminate. Right Ventricle: The right ventricular size is normal. No increase in right ventricular wall thickness. Right ventricular systolic function is normal. There is moderately elevated pulmonary artery systolic pressure. The tricuspid regurgitant velocity is 3.07 m/s, and with an assumed right atrial pressure of 15 mmHg, the estimated right ventricular systolic pressure is 98.9 mmHg. Left Atrium: Left atrial size was normal in  size. Right Atrium: Right atrial size was normal in size. Pericardium: Trivial pericardial effusion is present. Mitral Valve: The mitral valve is myxomatous. There is severe thickening of the mitral valve leaflet(s). Moderate to severe mitral valve regurgitation. Tricuspid Valve: The tricuspid valve is normal in structure. Tricuspid valve regurgitation is trivial. Aortic Valve: The aortic valve is normal in structure. Aortic valve regurgitation is trivial. No aortic stenosis is present. Pulmonic Valve: The pulmonic valve was grossly normal. Pulmonic valve regurgitation is trivial. Aorta: Aortic dilatation noted. There is dilatation of the aortic root, measuring 45 mm. There is dilatation of the ascending aorta, measuring 42 mm. IAS/Shunts: The interatrial septum was not well visualized.  LEFT VENTRICLE PLAX 2D LVIDd:         5.00 cm  Diastology LVIDs:         2.90 cm  LV e' medial:    6.75 cm/s LV PW:         1.10 cm  LV E/e' medial:  16.0 LV IVS:        1.30 cm  LV e' lateral:   9.45 cm/s LVOT diam:     2.60 cm  LV E/e' lateral: 11.4 LV SV:         101 LV SV Index:   53 LVOT Area:     5.31 cm  RIGHT VENTRICLE RV S prime:     12.90 cm/s TAPSE (M-mode): 2.3 cm LEFT ATRIUM             Index       RIGHT ATRIUM           Index LA diam:        2.20 cm 1.16 cm/m  RA Area:     19.40 cm LA Vol (A2C):   45.3 ml 23.81 ml/m RA Volume:   51.00 ml  26.80 ml/m LA Vol (A4C):   38.7 ml 20.34 ml/m LA Biplane Vol: 46.3 ml 24.33 ml/m  AORTIC VALVE LVOT Vmax:   97.30 cm/s LVOT Vmean:  60.400 cm/s LVOT VTI:    0.191 m  AORTA Ao Root diam: 4.50 cm Ao Asc diam:  4.20 cm MITRAL VALVE                TRICUSPID VALVE MV Area (PHT): 4.68 cm     TR Peak grad:   37.7 mmHg MV Decel Time: 162 msec     TR Vmax:        307.00 cm/s MR PISA:        1.57 cm MR PISA Radius: 0.50 cm     SHUNTS MV E velocity: 108.00 cm/s  Systemic VTI:  0.19 m MV A velocity: 85.90 cm/s   Systemic Diam: 2.60 cm MV E/A ratio:  1.26 Oswaldo Milian MD  Electronically signed by Oswaldo Milian MD Signature Date/Time: 12/29/2020/6:38:01 PM    Final       ASSESSMENT/PLAN:  This is a very pleasant 65 year old African American male with metastatic disease who was initially diagnosed with stage IB non-small cell lung cancer, adenocarcinoma of the left upper lobe. Molecular studies showed KRAS G12C mutation which can be targetted in the second line setting in the future. PD-L1 expression was negative. First diagnosed December 2017.   He was status post a wedge resection of the left upper lobe followed by 4 cycles of adjuvant systemic chemotherapy with cisplatin and Alimta. He tolerated treatment well except for fatigue. He had been on observation until June of 2018. 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   He then was undergoing systemic chemotherapy with carboplatin, alimta, and keytruda. He is status post 47 cycles. Starting from cycle #5, he has been on maintenance Alimta and Keytruda every 3 weeks. His Alimta has been held during a few cycles of treatment due to renal insufficiency. It was ultimately discontinued due to renal insufficiency. Beryle Flock was discontinued due to disease progression.   He then started on Targeted treatment with Lumakras 960 mg p.o. daily. His first dose was on 10/29/20. He is status post 2 months of treatment but his treatment has been on hold a few weeks because of liver dysfunction.   The patient was seen with Dr. Julien Nordmann today. He had a restaging CT scan performed. Dr. Julien Nordmann personally and independently reviewed the scan and discussed the results with the patient. The scan did not show evidence for disease progression. We will repeat a CBC and CMP today. If improvement in his LFTs, then we will resume the lumakras at a lower dose (480 mg). We will monitor his labs closely  weekly.   We will see him back for a follow up visit in 2 weeks for evaluation and repeat blood work.   Offered IVF today due to his tachycardia and recent AKI. He declined and will hydrate p.o. He will follow up with his cardiologist next week as scheduled for his mitral valve regurgitation and tachycardia.   He was discharged from the hospital with supplemental oxygen, which he has not been using. Recommend that he use his supplemental oxygen if needed. His oxygen saturation was 90% on RA today.   The patient was advised to call immediately if he has any concerning symptoms in the interval. The patient voices understanding of current disease status and treatment options and is in agreement with the current care plan. All questions were answered. The patient knows to call the clinic with any problems, questions or concerns. We can certainly see the patient much sooner if necessary    Orders Placed This Encounter  Procedures   CBC with Differential ([facility] Only)    Standing Status:   Standing    Number of Occurrences:   5    Standing Expiration Date:   01/05/2022   CMP ([facility] only)    Standing Status:   Standing    Number of Occurrences:   5    Standing Expiration Date:   01/05/2022       [name] William Golberg, PA-C 01/05/21  ADDENDUM: Hematology/Oncology Attending: I had a face-to-face encounter with the patient today.  I reviewed his record, lab and scan and  recommended his care plan.  This is a very pleasant 65 years old African-American male with metastatic non-small cell lung cancer that was initially diagnosed as a stage Ib (T2 a, N0, M0) non-small cell lung cancer, adenocarcinoma in December 2017 is status post left lower lobectomy with wedge resection of the left upper lobe followed by adjuvant systemic chemotherapy with cisplatin and Alimta.  The patient was on observation until June 2018 when he developed evidence of metastatic disease with bilateral  pulmonary nodules in addition to destructive bone lesion in the T4 vertebral body.  He underwent induction systemic chemotherapy with carboplatin, Alimta and Keytruda for 4 cycles followed by maintenance treatment initially with Alimta and Keytruda and then Alimta was discontinued secondary to renal insufficiency and the patient completed a total of 47 cycles of systemic chemotherapy at that time.  His molecular studies showed positive KRAS G12C mutation.  The patient started treatment with Lumakras (Sotorasib) 960 mg p.o. daily status post 2 months of treatment but this treatment was on hold the last 4-5 weeks secondary to worsening hepatic dysfunction.  He was treated with a tapered dose of prednisone over the last 4 weeks. He had repeat CT scan of the chest, abdomen pelvis performed last week but the patient was admitted to the hospital the day after the scan with significant diarrhea as well as shortness of breath and generalized weakness.  He received aggressive hydration and felt much better. He Is here today for evaluation and recommendation regarding his condition. I personally and independently reviewed the scan and discussed the results with the patient and his daughter. His scan showed improvement of the opacities in the lung.  There was no evidence for new metastatic or progressive disease. Will check his liver enzymes today and if they are within the acceptable range the patient will resume his treatment with Lumakras (Sotorasib) 480 mg p.o. daily. The patient will have weekly comprehensive metabolic panel for evaluation of his liver enzymes during the treatment. He will come back for follow-up visit in 2 weeks for evaluation. He was advised to call immediately if he has any other concerning symptoms in the interval.  Disclaimer: This note was dictated with voice recognition software. Similar sounding words can inadvertently be transcribed and may be missed upon review. Eilleen Kempf, MD   01/05/21

## 2020-12-30 NOTE — Progress Notes (Signed)
   12/30/20 0900  Mobility  Activity Ambulated in hall  Range of Motion/Exercises Active;All extremities  Level of Assistance Standby assist, set-up cues, supervision of patient - no hands on  Assistive Device None  Distance Ambulated (ft) 75 ft  Mobility Response Tolerated fair  Mobility performed by Mobility specialist  Mobility Ambulated with assistance in hallway   Patient was weaned to Room Air this morning. While ambulating on room air, desaturated to 78 %. Patient was placed back on 2L upon return to his room. After 3 minutes of deep breathing, recovered with saturation of 94 %.

## 2020-12-30 NOTE — Evaluation (Signed)
Physical Therapy Evaluation Patient Details Name: William SNARSKI [name] MRN: [id number] DOB: Feb 05, 1956 Today's Date: 12/30/2020   History of Present Illness  Pt is 65 y.o. male admitted for diarrhea, sepsis, SOB, hypotension. PMH significant for  lung cancer non-small cell lung cancer adenocarcinoma status post left lower lobectomy as well as wedge resection of the left upper lobe in 2018 on adjuvant chemotherapy last chemo 3 months ago, hep B, HTN, mitral insufficency, AAA s/p endovascular stent graft,and  severe myxomatous mitral valve/Barlow's mitral valve.  Clinical Impression  Pt is a 65 y.o. male with above HPI. Pt reports he is independent with ADLs and without assistive device for mobility at baseline. Pt ambulated ~36ft x2  with MIN assist-MIN guard demonstrating x2 staggering steps requiring assist from PT to maintain balance. Pt was weaned to RA this morning. While ambulating, pt with O2 desat to 78% low, seated rest provided with cues for deep breathing techniques. Pt with difficulty recovering, requiring 2L [place] for assist with recovery to 92-94%. Pt will benefit from continued skilled PT to increase independence and maximize safety with mobility.      Follow Up Recommendations Home health PT;Supervision - Intermittent    Equipment Recommendations  None recommended by PT    Recommendations for Other Services       Precautions / Restrictions Precautions Precautions: Fall Restrictions Weight Bearing Restrictions: No      Mobility  Bed Mobility Overal bed mobility: Needs Assistance Bed Mobility: Sit to Supine;Supine to Sit     Supine to sit: Supervision Sit to supine: Supervision   General bed mobility comments: HOB slightly elevated. Supervision for safety    Transfers Overall transfer level: Needs assistance Equipment used: None Transfers: Sit to/from Stand Sit to Stand: Min guard         General transfer comment: MIN guard for safety from  EOB  Ambulation/Gait Ambulation/Gait assistance: Min assist;Min guard Gait Distance (Feet): 50 Feet Assistive device: None Gait Pattern/deviations: Step-through pattern;Narrow base of support Gait velocity: decr   General Gait Details: Pt ambulated 83ftx2 with no assistive device, standing rest break. MIN assist-intermittent MIN guard provided for safety/stability. Pt with x2 scissor steps/narrow BOS resulting in staggering to L with MIN A from PT to maintain balance. Pt with O2 desat to 78% low, seated rest provided with cues for deep breathing techniques. Pt with difficulty recovering, requiring 2L [place] for assist with recovery to 90-92%.  Stairs            Wheelchair Mobility    Modified Rankin (Stroke Patients Only)       Balance Overall balance assessment: Needs assistance Sitting-balance support: Feet supported Sitting balance-Leahy Scale: Good     Standing balance support: During functional activity;No upper extremity supported Standing balance-Leahy Scale: Fair                               Pertinent Vitals/Pain Pain Assessment: No/denies pain    Home Living Family/patient expects to be discharged to:: Private residence Living Arrangements: Spouse/significant other Available Help at Discharge: Family Type of Home: House Home Access: Stairs to enter Entrance Stairs-Rails: Right Entrance Stairs-Number of Steps: 2 Home Layout: Two level;Full bath on main level Home Equipment: None Additional Comments: Pt's bedroom is on 2nd floor. Lives with wife, has family members available to assist.    Prior Function Level of Independence: Independent         Comments: Ambulates short household distances  recently (past few months) since not been feeling well. reports x 1 fall due to misstep and needed help getting up.     Hand Dominance   Dominant Hand: Left    Extremity/Trunk Assessment   Upper Extremity Assessment Upper Extremity Assessment:  Overall WFL for tasks assessed    Lower Extremity Assessment Lower Extremity Assessment: Generalized weakness    Cervical / Trunk Assessment Cervical / Trunk Assessment: Normal  Communication   Communication: No difficulties  Cognition Arousal/Alertness: Awake/alert Behavior During Therapy: WFL for tasks assessed/performed Overall Cognitive Status: Within Functional Limits for tasks assessed                                        General Comments      Exercises     Assessment/Plan    PT Assessment Patient needs continued PT services  PT Problem List Decreased strength;Decreased activity tolerance;Decreased balance;Decreased mobility       PT Treatment Interventions DME instruction;Gait training;Functional mobility training;Stair training;Therapeutic activities;Therapeutic exercise;Balance training;Patient/family education    PT Goals (Current goals can be found in the Care Plan section)  Acute Rehab PT Goals Patient Stated Goal: Be independent and go home PT Goal Formulation: With patient/family Time For Goal Achievement: 01/13/21 Potential to Achieve Goals: Good    Frequency Min 3X/week   Barriers to discharge        Co-evaluation               AM-PAC PT "6 Clicks" Mobility  Outcome Measure Help needed turning from your back to your side while in a flat bed without using bedrails?: None Help needed moving from lying on your back to sitting on the side of a flat bed without using bedrails?: None Help needed moving to and from a bed to a chair (including a wheelchair)?: A Little Help needed standing up from a chair using your arms (e.g., wheelchair or bedside chair)?: A Little Help needed to walk in hospital room?: A Little Help needed climbing 3-5 steps with a railing? : A Lot 6 Click Score: 19    End of Session Equipment Utilized During Treatment: Gait belt Activity Tolerance: Patient limited by fatigue;Treatment limited secondary to  medical complications (Comment) (O2 desat) Patient left: in bed;with call bell/phone within reach;with bed alarm set;with family/visitor present Nurse Communication: Mobility status (RN present in hallway, observed pt O2 desat on monitor and was present at end of session when therapist placed pt on 2L ) PT Visit Diagnosis: Unsteadiness on feet (R26.81);Muscle weakness (generalized) (M62.81)    Time: 6294-7654 PT Time Calculation (min) (ACUTE ONLY): 26 min   Charges:   PT Evaluation $PT Eval Low Complexity: 1 Low PT Treatments $Therapeutic Activity: 8-22 mins       Festus Barren PT, DPT  Acute Rehabilitation Services  Office [phone]  12/30/2020, 10:15 AM

## 2020-12-31 LAB — HEPATITIS B SURFACE ANTIBODY, QUANTITATIVE: Hep B S AB Quant (Post): <3.1 m[IU]/mL — ABNORMAL LOW (ref >9.9)

## 2020-12-31 LAB — HIV-1 RNA QUANT-NO REFLEX-BLD
HIV 1 RNA Quant: <20 copies/mL
LOG10 HIV-1 RNA: UNDETERMINED log10copy/mL

## 2021-01-01 LAB — HIV-1/HIV-2 QUALITATIVE RNA
Final Interpretation: NEGATIVE
HIV-1 RNA, Qualitative: NONREACTIVE
HIV-2 RNA, Qualitative: NONREACTIVE

## 2021-01-01 LAB — HIV-1/2 AB - DIFFERENTIATION
HIV 1 Ab: NONREACTIVE
HIV 2 Ab: NONREACTIVE
Note: NEGATIVE

## 2021-01-01 LAB — HCV RNA QUANT RFLX ULTRA OR GENOTYP
HCV RNA Qnt(log copy/mL): UNDETERMINED log10 IU/mL
HepC Qn: NOT DETECTED IU/mL

## 2021-01-02 DIAGNOSIS — E860 Dehydration: Secondary | ICD-10-CM | POA: Diagnosis not present

## 2021-01-02 DIAGNOSIS — I10 Essential (primary) hypertension: Secondary | ICD-10-CM | POA: Diagnosis not present

## 2021-01-02 DIAGNOSIS — C3490 Malignant neoplasm of unspecified part of unspecified bronchus or lung: Secondary | ICD-10-CM | POA: Diagnosis not present

## 2021-01-02 DIAGNOSIS — I050 Rheumatic mitral stenosis: Secondary | ICD-10-CM | POA: Diagnosis not present

## 2021-01-02 LAB — CULTURE, BLOOD (ROUTINE X 2)
Culture: NO GROWTH
Culture: NO GROWTH
Special Requests: ADEQUATE
Special Requests: ADEQUATE

## 2021-01-03 ENCOUNTER — Other Ambulatory Visit: Payer: Self-pay | Admitting: Internal Medicine

## 2021-01-05 ENCOUNTER — Telehealth: Payer: Self-pay | Admitting: Physician Assistant

## 2021-01-05 ENCOUNTER — Inpatient Hospital Stay: Payer: Federal, State, Local not specified - PPO

## 2021-01-05 ENCOUNTER — Inpatient Hospital Stay (HOSPITAL_BASED_OUTPATIENT_CLINIC_OR_DEPARTMENT_OTHER): Payer: Federal, State, Local not specified - PPO | Admitting: Physician Assistant

## 2021-01-05 ENCOUNTER — Other Ambulatory Visit: Payer: Self-pay

## 2021-01-05 ENCOUNTER — Telehealth: Payer: Self-pay | Admitting: Cardiology

## 2021-01-05 VITALS — BP 128/88 | HR 119 | Temp 97.1°F | Resp 18 | Wt 139.2 lb

## 2021-01-05 DIAGNOSIS — C3412 Malignant neoplasm of upper lobe, left bronchus or lung: Secondary | ICD-10-CM | POA: Diagnosis not present

## 2021-01-05 DIAGNOSIS — R7989 Other specified abnormal findings of blood chemistry: Secondary | ICD-10-CM | POA: Diagnosis not present

## 2021-01-05 DIAGNOSIS — C3432 Malignant neoplasm of lower lobe, left bronchus or lung: Secondary | ICD-10-CM | POA: Diagnosis not present

## 2021-01-05 DIAGNOSIS — Z5111 Encounter for antineoplastic chemotherapy: Secondary | ICD-10-CM

## 2021-01-05 DIAGNOSIS — C3492 Malignant neoplasm of unspecified part of left bronchus or lung: Secondary | ICD-10-CM

## 2021-01-05 DIAGNOSIS — Z79899 Other long term (current) drug therapy: Secondary | ICD-10-CM | POA: Diagnosis not present

## 2021-01-05 DIAGNOSIS — Z95828 Presence of other vascular implants and grafts: Secondary | ICD-10-CM

## 2021-01-05 LAB — CMP (CANCER CENTER ONLY)
ALT: 60 U/L — ABNORMAL HIGH (ref 0–44)
AST: 45 U/L — ABNORMAL HIGH (ref 15–41)
Albumin: 3.1 g/dL — ABNORMAL LOW (ref 3.5–5.0)
Alkaline Phosphatase: 261 U/L — ABNORMAL HIGH (ref 38–126)
Anion gap: 9 (ref 5–15)
BUN: 18 mg/dL (ref 8–23)
CO2: 22 mmol/L (ref 22–32)
Calcium: 9.6 mg/dL (ref 8.9–10.3)
Chloride: 106 mmol/L (ref 98–111)
Creatinine: 1.55 mg/dL — ABNORMAL HIGH (ref 0.61–1.24)
GFR, Estimated: 49 mL/min — ABNORMAL LOW (ref >60)
Glucose, Bld: 73 mg/dL (ref 70–99)
Potassium: 4 mmol/L (ref 3.5–5.1)
Sodium: 137 mmol/L (ref 135–145)
Total Bilirubin: 0.6 mg/dL (ref 0.3–1.2)
Total Protein: 7.2 g/dL (ref 6.5–8.1)

## 2021-01-05 MED ORDER — HEPARIN SOD (PORK) LOCK FLUSH 100 UNIT/ML IV SOLN
500.0000 [IU] | Freq: Once | INTRAVENOUS | Status: AC
  Administered 2021-01-05: 500 [IU]

## 2021-01-05 MED ORDER — SODIUM CHLORIDE 0.9% FLUSH
10.0000 mL | Freq: Once | INTRAVENOUS | Status: AC
  Administered 2021-01-05: 10 mL

## 2021-01-05 NOTE — Telephone Encounter (Signed)
Attempted to call the pt back and he did not answer and voicemail is not set-up at this time.  Below is reason why the pts BP medications were held in the hospital.  He was advised at discharge to continue to monitor his BP readings, and reintroduce his meds, if readings are trending high.  Will await pt to call back.    Discharge Diagnoses:  Active Problems:   SOB (shortness of breath)      #1 sirs-sepsis ruled out.  At the time of admission patient had a temp of 100.5 with blood pressure of 80/53 heart rate 120s and tachypneic and hypoxic and was placed on oxygen.  He was also found to have AKI secondary to dehydration and hypotension.  His cultures remain negative.  He was covered with cefepime the first 24 hours he was admitted.  MRSA PCR was negative.    #2 hypotension likely related to dehydration/diarrhea-resolved with IV fluids alone.  Coreg and lisinopril was on hold during hospital stay.  This is stopped on discharge patient and family is aware that they will need to recheck his blood pressure daily at home and restart blood pressure medications if blood pressure is trending high.   #3 history of non-small cell lung CA followed by Dr. Julien Nordmann   #4 AKI on CKD stage IIIb creatinine 0.72 down from 2.43 on admission wit hydration.   #5 elevated lipase denies any abdominal tenderness nausea vomiting   #6 diarrhea -resolved this is related to dye he drank prior to CT scan.   #7  Elevated LFTs monitor   #8 hypoalbuminemia/malnutrition-continue supplemental drinks with Ensure or boost   #9 elevated troponin secondary to demand ischemia   #10 severe myxomatous mitral valve/Barlow's mitral valve not a surgical candidate due to multiple underlying comorbidities followed by [facility].   Echo ejection fraction 60 to 65%.  Left ventricle has no regional wall motion abnormalities.  Mitral valve is myxomatous moderate to severe mitral valve regurgitation.

## 2021-01-05 NOTE — Telephone Encounter (Signed)
Patient states he was in the hospital Wednesday to Friday and they took him off his BP and chemo medications. Please advise.

## 2021-01-05 NOTE — Patient Instructions (Addendum)
-  Please try to stay hydrated -If your liver enzymes are better today, we will call you to resume your lumakras dose with 4 capsules (480 mg).  -Please continue to follow with your heart doctor as planned.

## 2021-01-05 NOTE — Telephone Encounter (Signed)
I called the patient to review his CMP from today. Dr. Julien Nordmann does want him to resume his Lumakras at a reduced dose 480 mg. I discussed with the patient and he will resume taking it as instructed.

## 2021-01-06 NOTE — Telephone Encounter (Signed)
Spoke with the pt.  He was calling to let us know that he was in the hospital from Wed-Fri for sob, hypotension, dehydration/diarrhea, and several other causes, as mentioned in this encounter. Pt states at discharge from the hospital, he was advised to hold his BP and chemo meds, check his BP daily at home, and restart blood pressure meds if recordings are trending high. Per the pt, he states he has been monitoring his pressures  daily since discharge, and his numbers are running around 118/70 and below.  He states he is aware that he should only reintroduce BP meds, if readings are trending high, until he gets back into the office to see [place] next Tuesday 9/20. Pt is also aware that he needs to stay plenty hydrated with water, being his hypotension was likely related to dehydration. Pt will see [name] PA-C as planned for next Tuesday 9/20 at 0945.  He will also share his recordings with Selinda Eon, at that visit.  Pt verbalized understanding and agrees with this plan. Pt was more than gracious for all the assistance provided.

## 2021-01-09 NOTE — Progress Notes (Deleted)
Cardiology Office Note    Date:  01/09/2021   ID:  William Kales [name], DOB 11-28-1955, MRN [id number]   PCP:  [name], MD   [place]  Cardiologist:  Sherren Mocha, MD *** Advanced Practice Provider:  No care team member to display Electrophysiologist:  None   [phone]   No chief complaint on file.   History of Present Illness:  William Fetch. is a 65 y.o. male with history of AAA status post stent graft in 2017, severe MR, metastatic adenocarcinoma of the lung.    Patient undergoing treatment with carboplatinum Alimta and Keytruda for lung CA.  Dr. Meda Coffee spoke with Dr. Earlie Server who said his lesions are shrinking and that we should go ahead and proceed with mitral valve repair as if he did not have cancer.   Repeat echo 05/2020 showed severe MR however normal filling pressures consistent with findings on cardiac cath 02/2020 and normal right-sided pressure.  Plans were for repeat echo in 6 months.  I saw the patient 10/04/20 and he was tolerating chemo.  Patient was discharged from the hospital 12/30/2020 after hospitalization for hypotension and dehydration and diarrhea.  Blood pressure and chemo meds were held.  Creatinine went from 2.43 down to 0.72 with IV fluids.   Echo 12/29/2020 LVEF 60 to 65% severe asymmetric LVH moderately elevated pulmonary systolic pressures 47.0 mmHg moderate to severe MR with significant thickening and prolapse of the anterior leaflet causing eccentric posterior directed MR jet.  Difficult to quantify MR given eccentric jet.  Consider TEE or cardiac MRI to quantify MR severity.   Past Medical History:  Diagnosis Date   AAA (abdominal aortic aneurysm) ([place])    AAA (abdominal aortic aneurysm) without rupture ([place]) 02/04/2014   Cancer ([place])    lung   Encounter for antineoplastic chemotherapy 06/06/2016   History of hepatitis B    HNP (herniated nucleus pulposus), lumbar    L4 with radiculopathy   Hypertension     Lung cancer ([place]) 05/18/2016   Malignant neoplasm of lower lobe of left lung ([place]) 04/05/2016   Mass of left lung    Mitral insufficiency 04/06/2016   This patient will eventually need MV repair if his prognosis is good from oncology standpoint. However, his lung cancer therapy  is the priority right now. His cardiac condition won't preclude possible lung surgery.  Once his lung cancer is under control he will need a TEE to further evaluate his mitral valve anatomy and MR severity, and also have ischemic workup as tere is evidence on calcificati   Renal insufficiency 10/09/2016   S/P partial lobectomy of lung 05/11/2016   Varicose veins of legs     Past Surgical History:  Procedure Laterality Date   ABDOMINAL AORTIC ENDOVASCULAR STENT GRAFT N/A 03/12/2016   Procedure: ABDOMINAL AORTIC ENDOVASCULAR STENT GRAFT;  Surgeon: Conrad Oak Hill, MD;  Location: [facility];  Service: Vascular;  Laterality: N/A;   COLONOSCOPY     INGUINAL HERNIA REPAIR Left 1975   Left inguinal hernia   INGUINAL HERNIA REPAIR Right    IR IMAGING GUIDED PORT INSERTION  09/04/2019   LOBECTOMY Left 05/11/2016   Procedure: LEFT LOWER LOBE LOBECTOMY AND LEFT UPPER LOBE  RESECTION AND PLACEMENT OF ON-Q;  Surgeon: [name], MD;  Location: [facility];  Service: Thoracic;  Laterality: Left;   LUMBAR LAMINECTOMY  October 22, 2012   LYMPH NODE DISSECTION Left 05/11/2016   Procedure: LYMPH NODE DISSECTION;  Surgeon: [name], MD;  Location: [facility];  Service: Thoracic;  Laterality: Left;   RIGHT/LEFT HEART CATH AND CORONARY ANGIOGRAPHY N/A 03/10/2020   Procedure: RIGHT/LEFT HEART CATH AND CORONARY ANGIOGRAPHY;  Surgeon: Sherren Mocha, MD;  Location: [facility];  Service: Cardiovascular;  Laterality: N/A;   STAPLING OF BLEBS Left 05/11/2016   Procedure: STAPLING OF APICAL BLEB;  Surgeon: [name], MD;  Location: [facility];  Service: Thoracic;  Laterality: Left;   TEE WITHOUT CARDIOVERSION N/A 10/07/2019   Procedure:  TRANSESOPHAGEAL ECHOCARDIOGRAM (TEE);  Surgeon: Dorothy Spark, MD;  Location[facility] ENDOSCOPY;  Service: Cardiovascular;  Laterality: N/A;   VIDEO ASSISTED THORACOSCOPY Left 05/18/2016   Procedure: VIDEO ASSISTED THORACOSCOPY WITH REMOVAL OF LEFT APICAL BLEB;  Surgeon: [name], MD;  Location: [facility];  Service: Thoracic;  Laterality: Left;   VIDEO ASSISTED THORACOSCOPY (VATS)/WEDGE RESECTION Left 05/11/2016   Procedure: LEFT VIDEO ASSISTED THORACOSCOPY (VATS);  Surgeon: [name], MD;  Location: [place];  Service: Thoracic;  Laterality: Left;   VIDEO BRONCHOSCOPY N/A 05/11/2016   Procedure: VIDEO BRONCHOSCOPY, LEFT LUNG;  Surgeon: [name], MD;  Location: [facility];  Service: Thoracic;  Laterality: N/A;   VIDEO BRONCHOSCOPY N/A 05/18/2016   Procedure: VIDEO BRONCHOSCOPY WITH BRONCHIAL WASHING;  Surgeon: [name], MD;  Location: [facility];  Service: Thoracic;  Laterality: N/A;    Current Medications: No outpatient medications have been marked as taking for the 01/10/21 encounter (Appointment) with Imogene Burn, PA-C.     Allergies:   Tramadol   Social History   Socioeconomic History   Marital status: Married    Spouse name: Not on file   Number of children: Not on file   Years of education: Not on file   Highest education level: Not on file  Occupational History   Not on file  Tobacco Use   Smoking status: Former    Packs/day: 1.00    Types: Cigarettes    Quit date: 03/05/2016    Years since quitting: 4.8   Smokeless tobacco: Never  Vaping Use   Vaping Use: Never used  Substance and Sexual Activity   Alcohol use: Yes    Alcohol/week: 6.0 standard drinks    Types: 6 Cans of beer per week   Drug use: Yes    Types: Cocaine, Heroin    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   ROS:   Please see the history of present illness.    ROS All other systems reviewed and are negative.   PHYSICAL EXAM:   VS:  There were no vitals taken for this visit.  Physical Exam  GEN: Well nourished, well developed, in no acute distress  HEENT: normal  Neck: no JVD, carotid bruits, or masses Cardiac:RRR; no murmurs, rubs, or gallops  Respiratory:  clear to auscultation bilaterally, normal work of breathing GI: soft, nontender, nondistended, + BS Ext: without cyanosis, clubbing, or edema, Good distal pulses bilaterally MS: no deformity or atrophy  Skin: warm and dry, no rash Neuro:  Alert and Oriented x 3, Strength and sensation are intact Psych: euthymic mood, full affect  Wt Readings from Last 3 Encounters:  01/05/21 139 lb 3 oz (63.1 kg)  12/28/20 143 lb (64.9 kg)  12/13/20 139 lb 3.2 oz (63.1 kg)      Studies/Labs Reviewed:   EKG:  EKG is*** ordered today.  The ekg ordered today demonstrates ***  Recent Labs: 12/27/2020: TSH 1.161 12/30/2020: Hemoglobin 10.9; Platelets 153 01/05/2021: ALT 60; BUN 18; Creatinine 1.55; Potassium 4.0; Sodium 137   Lipid Panel No results found for: CHOL, TRIG, HDL, CHOLHDL, VLDL, LDLCALC, LDLDIRECT  Additional studies/ records that were reviewed today include:   2D echo 9/8/2022IMPRESSIONS     1. Left ventricular ejection fraction, by estimation, is 60 to 65%. The  left ventricle has normal function. The left ventricle has no regional  wall motion abnormalities. There is severe asymmetric left ventricular  hypertrophy of the basal-septal  segment. Left ventricular diastolic parameters are indeterminate.   2. Right ventricular systolic function is normal. The right ventricular  size is  normal. There is moderately elevated pulmonary artery systolic  pressure. The estimated right ventricular systolic pressure is 40.9 mmHg.   3. The aortic valve is normal in structure. Aortic valve regurgitation is  trivial. No aortic stenosis is present.   4. Aortic dilatation noted. There is dilatation of the aortic root,  measuring 45 mm. There is dilatation of the ascending aorta, measuring 42  mm.   5. The mitral valve is myxomatous. Moderate to severe mitral valve  regurgitation. Significant thickening and prolapse of anterior leaflet  causing eccentric posterior directed MR jet. Difficult to quantify MR  given eccentric jet, but no LA/LV dilatation  and E-wave velocity less than 1.2 m/s argues against severe MR. Would  consider TEE or cardiac MRI to quantify MR severity   FINDINGS   Left Ventricle: Left ventricular ejection fraction, by estimation, is 60  to 65%. The left ventricle has normal function. The left ventricle has no  regional wall motion abnormalities. The left ventricular internal cavity  size was normal in size. There is   severe asymmetric left ventricular hypertrophy of the basal-septal  segment. Left ventricular diastolic parameters are indeterminate.   Right Ventricle: The right ventricular size is normal. No increase in  right ventricular wall thickness. Right ventricular systolic function is  normal. There is moderately elevated pulmonary artery systolic pressure.  The tricuspid regurgitant velocity is  3.07 m/s, and with an assumed right atrial pressure of 15 mmHg, the  estimated right ventricular systolic pressure is 81.1 mmHg.   Left Atrium: Left atrial size was normal in size.   Right Atrium: Right atrial size was normal in size.   Pericardium: Trivial pericardial effusion is present.   Mitral Valve: The mitral valve is myxomatous. There is severe thickening  of the mitral valve leaflet(s). Moderate to severe mitral valve  regurgitation.    Tricuspid Valve: The tricuspid valve is normal in structure. Tricuspid  valve regurgitation is trivial.   Aortic Valve: The aortic valve is normal in structure. Aortic valve  regurgitation is trivial. No aortic stenosis is present.   Pulmonic Valve: The pulmonic valve was grossly normal. Pulmonic valve  regurgitation is trivial.   Aorta: Aortic dilatation noted. There is dilatation of the aortic root,  measuring 45 mm. There is dilatation of the ascending aorta, measuring 42  mm.   IAS/Shunts: The interatrial septum was not well  visualized.  TTE: 08/18/2019    1. Left ventricular ejection fraction, by estimation, is 60 to 65%. The  left ventricle has normal function. The left ventricle has no regional  wall motion abnormalities. There is mild left ventricular hypertrophy.  Left ventricular diastolic parameters  are consistent with Grade I diastolic dysfunction (impaired relaxation).   2. Right ventricular systolic function is normal. The right ventricular  size is normal. There is severely elevated pulmonary artery systolic  pressure. The estimated right ventricular systolic pressure is 40.9 mmHg.   3. Left atrial size was moderately dilated.   4. The mitral valve is myxomatous. Severe mitral valve regurgitation.   5. The aortic valve is tricuspid. Aortic valve regurgitation is not  visualized.   6. Aortic dilatation noted. Aneurysm of the ascending aorta, measuring 40  mm. There is moderate to severe dilatation at the level of the sinuses of  Valsalva measuring 49 mm.    TEE 10/07/2019   1. There is 4+ primary mitral regurgitation with myxomatous anterior  mitral valve leaflet, with prolapse of all three scallops. The jet is  posteriorly directed, eccentric with reversal of pulmonary vein forward  flow bilateraly. PISA radius 1.0 cm, ERO  0.45 cm2, regurgitation volume 58 ml. Posterior leaflet measures 1.0 cm.  Flail gap 0.3 cm.   2. Left ventricular ejection fraction, by  estimation, is 60 to 65%. The  left ventricle has normal function. The left ventricle has no regional  wall motion abnormalities.   3. Right ventricular systolic function is normal. The right ventricular  size is normal. There is normal pulmonary artery systolic pressure.   4. Left atrial size was moderately dilated. No left atrial/left atrial  appendage thrombus was detected. The LAA emptying velocity was 40 cm/s.   5. Right atrial size was mildly dilated.   6. The mitral valve is myxomatous. No evidence of mitral valve  regurgitation. No evidence of mitral stenosis.   7. Tricuspid valve regurgitation is moderate.   8. The aortic valve is normal in structure. Aortic valve regurgitation is  trivial. No aortic stenosis is present.   9. There is moderate dilatation at the level of the sinuses of Valsalva  measuring 48 mm. There is mild (Grade II) plaque.            Risk Assessment/Calculations:   {Does this patient have ATRIAL FIBRILLATION?:[id number]}     ASSESSMENT:    No diagnosis found.   PLAN:  In order of problems listed above:  Mitral valve prolapse severe 4+ mitral regurgitation, with worsening symptoms NYHA class IIb TEE in June 2021, repeat echocardiogram in February 2022 showed severe mitral regurgitation however normal filling pressure consistent with findings on cardiac cath in November 2021 and normal right-sided pressures.  Echo 12/29/2020 LVEF 60 to 65% severe asymmetric LVH moderately elevated pulmonary systolic pressures 81.1 mmHg moderate to severe MR with significant thickening and prolapse of the anterior leaflet causing eccentric posterior directed MR jet.  Difficult to quantify MR given eccentric jet.  Consider TEE or cardiac MRI to quantify MR severity.     Lung cancer -followed by Dr. Earlie Server who Dr. Meda Coffee communicated with him patient's plan and he believes he should be treated as if he did not have cancer.  He is responding well to chemotherapy and his  lesion has been shrinking.      Ascending aortic aneurysm -previously followed by Dr. Servando Snare, most recent CTA in January 2022 showed stable diameter 42 mm. CT 12/28/20  4.1  x 4.0 cm   Essential hypertension -well-controlled on lisinopril and amlodipine-recent hypotension from dehydration and meds held.        Shared Decision Making/Informed Consent   {Are you ordering a CV Procedure (e.g. stress test, cath, DCCV, TEE, etc)?   Press F2        :[id number]}    Medication Adjustments/Labs and Tests Ordered: Current medicines are reviewed at length with the patient today.  Concerns regarding medicines are outlined above.  Medication changes, Labs and Tests ordered today are listed in the Patient Instructions below. There are no Patient Instructions on file for this visit.   Sumner Boast, PA-C  01/09/2021 2:49 PM    [facility] [place], [place], [place]  [address] Phone: [phone]; Fax: [phone]

## 2021-01-10 ENCOUNTER — Other Ambulatory Visit (HOSPITAL_COMMUNITY): Payer: Federal, State, Local not specified - PPO

## 2021-01-10 ENCOUNTER — Ambulatory Visit: Payer: Federal, State, Local not specified - PPO | Admitting: Physician Assistant

## 2021-01-12 ENCOUNTER — Telehealth: Payer: Self-pay | Admitting: Medical Oncology

## 2021-01-12 ENCOUNTER — Other Ambulatory Visit: Payer: Self-pay | Admitting: Physician Assistant

## 2021-01-12 ENCOUNTER — Inpatient Hospital Stay: Payer: Federal, State, Local not specified - PPO

## 2021-01-12 ENCOUNTER — Other Ambulatory Visit: Payer: Self-pay

## 2021-01-12 DIAGNOSIS — C3492 Malignant neoplasm of unspecified part of left bronchus or lung: Secondary | ICD-10-CM

## 2021-01-12 DIAGNOSIS — R7989 Other specified abnormal findings of blood chemistry: Secondary | ICD-10-CM

## 2021-01-12 DIAGNOSIS — Z95828 Presence of other vascular implants and grafts: Secondary | ICD-10-CM

## 2021-01-12 DIAGNOSIS — Z79899 Other long term (current) drug therapy: Secondary | ICD-10-CM | POA: Diagnosis not present

## 2021-01-12 DIAGNOSIS — C3432 Malignant neoplasm of lower lobe, left bronchus or lung: Secondary | ICD-10-CM

## 2021-01-12 DIAGNOSIS — C3412 Malignant neoplasm of upper lobe, left bronchus or lung: Secondary | ICD-10-CM | POA: Diagnosis not present

## 2021-01-12 LAB — CMP (CANCER CENTER ONLY)
ALT: 192 U/L — ABNORMAL HIGH (ref 0–44)
AST: 129 U/L — ABNORMAL HIGH (ref 15–41)
Albumin: 2.9 g/dL — ABNORMAL LOW (ref 3.5–5.0)
Alkaline Phosphatase: 472 U/L — ABNORMAL HIGH (ref 38–126)
Anion gap: 13 (ref 5–15)
BUN: 18 mg/dL (ref 8–23)
CO2: 19 mmol/L — ABNORMAL LOW (ref 22–32)
Calcium: 9.6 mg/dL (ref 8.9–10.3)
Chloride: 103 mmol/L (ref 98–111)
Creatinine: 1.95 mg/dL — ABNORMAL HIGH (ref 0.61–1.24)
GFR, Estimated: 37 mL/min — ABNORMAL LOW (ref >60)
Glucose, Bld: 200 mg/dL — ABNORMAL HIGH (ref 70–99)
Potassium: 4 mmol/L (ref 3.5–5.1)
Sodium: 135 mmol/L (ref 135–145)
Total Bilirubin: 1.5 mg/dL — ABNORMAL HIGH (ref 0.3–1.2)
Total Protein: 7 g/dL (ref 6.5–8.1)

## 2021-01-12 LAB — CBC WITH DIFFERENTIAL (CANCER CENTER ONLY)
Abs Immature Granulocytes: 0.08 10*3/uL — ABNORMAL HIGH (ref 0.00–0.07)
Basophils Absolute: 0.1 10*3/uL (ref 0.0–0.1)
Basophils Relative: 1 %
Eosinophils Absolute: 0.5 10*3/uL (ref 0.0–0.5)
Eosinophils Relative: 6 %
HCT: 41.8 % (ref 39.0–52.0)
Hemoglobin: 13.2 g/dL (ref 13.0–17.0)
Immature Granulocytes: 1 %
Lymphocytes Relative: 15 %
Lymphs Abs: 1.1 10*3/uL (ref 0.7–4.0)
MCH: 29.3 pg (ref 26.0–34.0)
MCHC: 31.6 g/dL (ref 30.0–36.0)
MCV: 92.9 fL (ref 80.0–100.0)
Monocytes Absolute: 0.7 10*3/uL (ref 0.1–1.0)
Monocytes Relative: 9 %
Neutro Abs: 4.9 10*3/uL (ref 1.7–7.7)
Neutrophils Relative %: 68 %
Platelet Count: 392 10*3/uL (ref 150–400)
RBC: 4.5 MIL/uL (ref 4.22–5.81)
RDW: 16.1 % — ABNORMAL HIGH (ref 11.5–15.5)
WBC Count: 7.3 10*3/uL (ref 4.0–10.5)
nRBC: 0 % (ref 0.0–0.2)

## 2021-01-12 MED ORDER — SODIUM CHLORIDE 0.9% FLUSH
10.0000 mL | Freq: Once | INTRAVENOUS | Status: AC
  Administered 2021-01-12: 10 mL

## 2021-01-12 MED ORDER — HEPARIN SOD (PORK) LOCK FLUSH 100 UNIT/ML IV SOLN
500.0000 [IU] | Freq: Once | INTRAVENOUS | Status: AC
  Administered 2021-01-12: 500 [IU]

## 2021-01-12 MED ORDER — PREDNISONE 10 MG PO TABS
ORAL_TABLET | ORAL | 0 refills | Status: DC
Start: 2021-01-12 — End: 2021-01-30

## 2021-01-12 NOTE — Progress Notes (Signed)
The patient's LFTs are increasing. I spoke with Dr. Julien Nordmann who recommended holding his lumakras again and restarting a prednisone taper. I called the patient with the instructions. We will see him next week for evaluation and repeat blood work.

## 2021-01-12 NOTE — Telephone Encounter (Signed)
I reviewed labs with wife and gave her instructions on pt stopping Lumakras and start prednisone.

## 2021-01-13 NOTE — Progress Notes (Signed)
[facility] OFFICE PROGRESS NOTE  [name], [address] [address]  DIAGNOSIS:  Metastatic non-small cell lung cancer, adenocarcinoma initially diagnosed as stage IB (T2a, N0, M0) non-small cell lung cancer, adenocarcinoma diagnosed in December 2017.  The patient has evidence for disease recurrence in July 2019.   Biomarker Findings Tumor Mutational Burden - TMB-Intermediate (16 Muts/Mb) Microsatellite status - MS-Stable Genomic Findings For a complete list of the genes assayed, please refer to the Appendix. KIT amplification KRAS [id number] SMAD4 splice site [id number]>Y DX41 I232F 7 Disease relevant genes with no reportable alterations: EGFR, ALK, BRAF, MET, RET, ERBB2, ROS1    PDL 1 expression is 0%  PRIOR THERAPY: 1) status post left lower lobectomy as well as wedge resection of the left upper lobe on 05/11/2016. 2) Adjuvant systemic chemotherapy with cisplatin 75 MG/M2 and Alimta 500 MG/M2 every 3 weeks. First dose 07/03/2016. Status post 4 cycles. 3) Systemic chemotherapy with carboplatin for AUC of 5, Alimta 500 mg/M2 and Keytruda 200 mg IV every 3 weeks.  Status post 47 cycles.  Starting from cycle #5 the patient will be treated with maintenance Alimta and Ketruda (pembrolizumab) every 3 weeks.  This treatment was discontinued secondary to disease progression.  CURRENT THERAPY:  Lumakras (Sotorasib) 960 mg p.o. daily started October 29, 2020.  Status post 2 months of treatment. Treatment was on hold for a few weeks because liver dysfunction as patient received prednisone taper. Resumed at a lower dose on 01/05/21 480 mg. On hold starting from 01/12/21 due to elevated LFTs.   INTERVAL HISTORY: William Kales [name] 65 y.o. male returns to the clinic today for a follow-up visit accompanied by his wife.  At the patient's last appointment on 01/05/2021, the patient's treatment with Lumakras had been hold for several weeks prior to that appointment while he  completed a high-dose prednisone taper for his elevated LFTs which is likely secondary to his targeted treatment with Lumakras.  His LFTs were improved at the time of his appointment on 01/05/2021 and his treatment with Lumakras was resumed with a lower dose 480 mg p.o. daily.  The patient had been on this for 1 week and then started showing evidence of recurrent liver dysfunction on his weekly labs.  His treatment has now been on hold for 1 week and he is on a prednisone taper.  He is presently taking 40 mg of prednisone daily. He is scheduled to start 2 tablets daily on Saturday 01/21/21.   Overall, the patient is feeling well today. His energy is improving and he is eating better due to the steroids. He denies fatigue, weakness, or abdominal pain.  He denies any nausea, vomiting, or constipation.  His diarrhea has also resolved as well. He denies changes with his respiration status. Denies chest pain, cough, or hemoptysis. He reports his baseline dyspnea on exertion. He was discharged from the hospital on home oxygen but has not been wearing it. His oxygen is 100% on room air today. He sees cardiology for severe mitral regurgitation.  He denies any fever, chills, or night sweats.  He denies any headache or visual changes.  He is here today for evaluation and repeat blood work.    MEDICAL HISTORY: Past Medical History:  Diagnosis Date   AAA (abdominal aortic aneurysm) ([place])    AAA (abdominal aortic aneurysm) without rupture ([facility]) 02/04/2014   Cancer ([place])    lung   Encounter for antineoplastic chemotherapy 06/06/2016   History  of hepatitis B    HNP (herniated nucleus pulposus), lumbar    L4 with radiculopathy   Hypertension    Lung cancer ([place]) 05/18/2016   Malignant neoplasm of lower lobe of left lung ([facility]) 04/05/2016   Mass of left lung    Mitral insufficiency 04/06/2016   This patient will eventually need MV repair if his prognosis is good from oncology standpoint. However, his lung cancer  therapy  is the priority right now. His cardiac condition won't preclude possible lung surgery.  Once his lung cancer is under control he will need a TEE to further evaluate his mitral valve anatomy and MR severity, and also have ischemic workup as tere is evidence on calcificati   Renal insufficiency 10/09/2016   S/P partial lobectomy of lung 05/11/2016   Varicose veins of legs     ALLERGIES:  is allergic to tramadol.  MEDICATIONS:  Current Outpatient Medications  Medication Sig Dispense Refill   carvedilol (COREG CR) 10 MG 24 hr capsule Take 10 mg by mouth daily.     lisinopril (ZESTRIL) 10 MG tablet Take 10 mg by mouth daily.     predniSONE (DELTASONE) 10 MG tablet 6 tablets p.o. daily for 4 days, followed by 4 tablet p.o. daily for 4 days, followed by 2 tablet p.o. daily for 4 days, followed by 1 tablet p.o. daily for 4 days then stop. 52 tablet 0   loperamide (IMODIUM A-D) 2 MG tablet Take 2 mg by mouth 4 (four) times daily as needed for diarrhea or loose stools. (Patient not taking: Reported on 01/19/2021)     No current facility-administered medications for this visit.    SURGICAL HISTORY:  Past Surgical History:  Procedure Laterality Date   ABDOMINAL AORTIC ENDOVASCULAR STENT GRAFT N/A 03/12/2016   Procedure: ABDOMINAL AORTIC ENDOVASCULAR STENT GRAFT;  Surgeon: Conrad Gillett, MD;  Location: [facility];  Service: Vascular;  Laterality: N/A;   COLONOSCOPY     INGUINAL HERNIA REPAIR Left 1975   Left inguinal hernia   INGUINAL HERNIA REPAIR Right    IR IMAGING GUIDED PORT INSERTION  09/04/2019   LOBECTOMY Left 05/11/2016   Procedure: LEFT LOWER LOBE LOBECTOMY AND LEFT UPPER LOBE  RESECTION AND PLACEMENT OF ON-Q;  Surgeon: [name], MD;  Location: [facility];  Service: Thoracic;  Laterality: Left;   LUMBAR LAMINECTOMY  October 22, 2012   LYMPH NODE DISSECTION Left 05/11/2016   Procedure: LYMPH NODE DISSECTION;  Surgeon: [name], MD;  Location: [facility];  Service: Thoracic;  Laterality:  Left;   RIGHT/LEFT HEART CATH AND CORONARY ANGIOGRAPHY N/A 03/10/2020   Procedure: RIGHT/LEFT HEART CATH AND CORONARY ANGIOGRAPHY;  Surgeon: Sherren Mocha, MD;  Location: [facility];  Service: Cardiovascular;  Laterality: N/A;   STAPLING OF BLEBS Left 05/11/2016   Procedure: STAPLING OF APICAL BLEB;  Surgeon: [name], MD;  Location: [facility];  Service: Thoracic;  Laterality: Left;   TEE WITHOUT CARDIOVERSION N/A 10/07/2019   Procedure: TRANSESOPHAGEAL ECHOCARDIOGRAM (TEE);  Surgeon: Dorothy Spark, MD;  Location: [facility] [place] [facility];  Service: Cardiovascular;  Laterality: N/A;   VIDEO ASSISTED THORACOSCOPY Left 05/18/2016   Procedure: VIDEO ASSISTED THORACOSCOPY WITH REMOVAL OF LEFT APICAL BLEB;  Surgeon: [name], MD;  Location: [facility];  Service: Thoracic;  Laterality: Left;   VIDEO ASSISTED THORACOSCOPY (VATS)/WEDGE RESECTION Left 05/11/2016   Procedure: LEFT VIDEO ASSISTED THORACOSCOPY (VATS);  Surgeon: [name], MD;  Location: [facility];  Service: Thoracic;  Laterality: Left;  VIDEO BRONCHOSCOPY N/A 05/11/2016   Procedure: VIDEO BRONCHOSCOPY, LEFT LUNG;  Surgeon: [name], MD;  Location: [facility];  Service: Thoracic;  Laterality: N/A;   VIDEO BRONCHOSCOPY N/A 05/18/2016   Procedure: VIDEO BRONCHOSCOPY WITH BRONCHIAL WASHING;  Surgeon: [name], MD;  Location: [facility];  Service: Thoracic;  Laterality: N/A;    REVIEW OF SYSTEMS:   Review of Systems  Constitutional: Negative for appetite change, chills, fever and unexpected weight change.  HENT: Negative for mouth sores, nosebleeds, sore throat and trouble swallowing.   Eyes: Negative for eye problems and icterus.  Respiratory: Positive for shortness of breath with exertion. Negative for cough, hemoptysis, and wheezing.   Cardiovascular: Negative for chest pain and leg swelling.  Gastrointestinal: Negative for abdominal pain, constipation, diarrhea, nausea and vomiting.  Genitourinary: Negative for bladder  incontinence, difficulty urinating, dysuria, frequency and hematuria.   Musculoskeletal: Negative for back pain, gait problem, neck pain and neck stiffness.  Skin: Negative for itching and rash.  Neurological: Negative for dizziness, extremity weakness, gait problem, headaches, light-headedness and seizures.  Hematological: Negative for adenopathy. Does not bruise/bleed easily.  Psychiatric/Behavioral: Negative for confusion, depression and sleep disturbance. The patient is not nervous/anxious.       PHYSICAL EXAMINATION:  Blood pressure 139/85, pulse 89, temperature 97.6 F (36.4 C), resp. rate 20, height $RemoveBe'6\' 3"'GjEejkFGo$  (1.905 m), weight 144 lb 14.4 oz (65.7 kg), SpO2 100 %.  ECOG PERFORMANCE STATUS: 1  Physical Exam  Constitutional: Oriented to person, place, and time and thin appearing male and in no distress.   HENT:  Head: Normocephalic and atraumatic.  Mouth/Throat: Oropharynx is clear and moist. No oropharyngeal exudate.  Eyes: Conjunctivae are normal. Right eye exhibits no discharge. Left eye exhibits no discharge. No scleral icterus.  Neck: Normal range of motion. Neck supple.  Cardiovascular: Cardiovascular: Systolic murmur auscultated in his posterior left thorax. Normal rate, regular rhythm, normal heart sounds and intact distal pulses.   Pulmonary/Chest: Effort normal and breath sounds normal. No respiratory distress. No wheezes. No rales.  Abdominal: Soft. Bowel sounds are normal. Exhibits no distension and no mass. There is no tenderness.  Musculoskeletal: Normal range of motion. Exhibits no edema.  Lymphadenopathy:    No cervical adenopathy.  Neurological: Alert and oriented to person, place, and time. Exhibits normal muscle tone. Gait normal. Coordination normal.  Skin: Skin is warm and dry. No rash noted. Not diaphoretic. No erythema. No pallor.  Psychiatric: Mood, memory and judgment normal.  Vitals reviewed.  LABORATORY DATA: Lab Results  Component Value Date   WBC 18.2  (H) 01/19/2021   HGB 12.9 (L) 01/19/2021   HCT 41.7 01/19/2021   MCV 94.1 01/19/2021   PLT 450 (H) 01/19/2021      Chemistry      Component Value Date/Time   NA 138 01/19/2021 1115   NA 132 (L) 10/05/2019 0830   NA 138 01/11/2017 1343   K 4.1 01/19/2021 1115   K 4.7 01/11/2017 1343   CL 108 01/19/2021 1115   CO2 20 (L) 01/19/2021 1115   CO2 23 01/11/2017 1343   BUN 29 (H) 01/19/2021 1115   BUN 14 10/05/2019 0830   BUN 20.2 01/11/2017 1343   CREATININE 1.58 (H) 01/19/2021 1115   CREATININE 1.6 (H) 01/11/2017 1343      Component Value Date/Time   CALCIUM 9.2 01/19/2021 1115   CALCIUM 9.3 01/11/2017 1343   ALKPHOS 229 (H) 01/19/2021 1115   ALKPHOS 89 01/11/2017 1343   AST 34  01/19/2021 1115   AST 41 (H) 01/11/2017 1343   ALT 66 (H) 01/19/2021 1115   ALT 27 01/11/2017 1343   BILITOT 0.6 01/19/2021 1115   BILITOT 0.49 01/11/2017 1343       RADIOGRAPHIC STUDIES:  CT Abdomen Pelvis Wo Contrast  Result Date: 12/28/2020 CLINICAL DATA:  Primary Cancer Type: Lung Imaging Indication: Assess response to therapy Interval therapy since last imaging? Yes Initial Cancer Diagnosis Date: 03/29/2016; Established by: Biopsy-proven Detailed Pathology: Stage Ib non-small cell lung cancer, adenocarcinoma. Primary Tumor location:   Left lower lobe. Recurrence? Yes; Date(s) of recurrence: 12/02/2017; Established by: Biopsy-proven; metastasis to T4. Surgeries: Left lower lobectomy and left upper lobe wedge resection, with stapling of apical bleb, 05/11/2016. AAA stent graft 2017. Chemotherapy: Yes; Ongoing? No; Most recent administration: 06/30/2020 Immunotherapy?  Yes; Type: Keytruda; Ongoing? No Radiation therapy? No Other Cancer Therapies: Lumakras daily. EXAM: CT CHEST, ABDOMEN AND PELVIS WITHOUT CONTRAST TECHNIQUE: Multidetector CT imaging of the chest, abdomen and pelvis was performed following the standard protocol without IV contrast. COMPARISON:  Most recent CT chest, abdomen and pelvis  10/17/2020. 11/12/2017 PET-CT. FINDINGS: CT CHEST FINDINGS Cardiovascular: Right chest port catheter. Aortic atherosclerosis. 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 Normal heart size. No pericardial effusion. Mediastinum/Nodes: No enlarged mediastinal, hilar, or axillary lymph nodes. Small hiatal hernia. Thyroid gland, trachea, and esophagus demonstrate no significant findings. Lungs/Pleura: Moderate centrilobular and paraseptal emphysema. Redemonstrated postoperative findings of left lower lobectomy and posterior left upper lobe wedge resection. Irregular nodular opacities seen on prior examination are diminished or resolved compared to prior examination, an index nodule of the anterior right upper lobe measuring no greater than 0.5 x 0.3 cm, previously 0.8 x 0.6 cm (series 6, image 77) and an index nodule of the peripheral left upper lobe measuring 0.7 x 0 point 4 cm, previously 1.0 x 0.5 cm (series 6, image 75). Pleural thickening and calcification of the dependent right pleura. Trace, chronic, loculated left pleural effusion. Musculoskeletal: No chest wall mass. Unchanged, mildly expansile sclerotic lesion of the spinous process of T4 (series 6, image 45). CT ABDOMEN PELVIS FINDINGS Hepatobiliary: No solid liver abnormality is seen. No gallstones, gallbladder wall thickening, or biliary dilatation. Pancreas: Unremarkable. No pancreatic ductal dilatation or surrounding inflammatory changes. Spleen: Normal in size without significant abnormality. Adrenals/Urinary Tract: Adrenal glands are unremarkable. Kidneys are normal, without renal calculi, solid lesion, or hydronephrosis. Bladder is unremarkable. Stomach/Bowel: Stomach is within normal limits. Appendix appears normal. No evidence of bowel wall thickening, distention, or inflammatory changes. Vascular/Lymphatic: Aortic atherosclerosis. Status post aortobiiliac stent endograft repair. No enlarged abdominal  or pelvic lymph nodes. Reproductive: No mass or other abnormality. Other: No abdominal wall hernia or abnormality. No abdominopelvic ascites. Musculoskeletal: No acute or significant osseous findings. IMPRESSION: 1. Redemonstrated postoperative findings of left lower lobectomy and posterior left upper lobe wedge resection. 2. Irregular nodular opacities seen on prior examination are diminished or resolved compared to prior examination, most consistent with treatment response of pulmonary metastatic disease. Resolving infection or inflammation remain differential considerations. 3. No noncontrast evidence of new metastatic disease in the chest, abdomen, or pelvis. 4. Unchanged, mildly expansile sclerotic lesion of the spinous process of T4, of uncertain significance. No additional findings to suggest osseous metastatic disease. 5. 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 Recommend annual imaging followup by CTA or MRA. This recommendation follows 2010 ACCF/AHA/AATS/ACR/ASA/SCA/SCAI/SIR/STS/SVM Guidelines for  the Diagnosis and Management of Patients with Thoracic Aortic Disease. Circulation. 2010; 121: T732-K025. Aortic aneurysm NOS (ICD10-I71.9) 6. Emphysema and diffuse bilateral bronchial wall thickening. 7. Status post aortobiiliac stent endograft repair. Aortic Atherosclerosis (ICD10-I70.0) and Emphysema (ICD10-J43.9). Electronically Signed   By: Eddie Candle M.D.   On: 12/28/2020 11:01   CT Chest Wo Contrast  Result Date: 12/28/2020 CLINICAL DATA:  Primary Cancer Type: Lung Imaging Indication: Assess response to therapy Interval therapy since last imaging? Yes Initial Cancer Diagnosis Date: 03/29/2016; Established by: Biopsy-proven Detailed Pathology: Stage Ib non-small cell lung cancer, adenocarcinoma. Primary Tumor location:   Left lower lobe. Recurrence? Yes; Date(s) of recurrence: 12/02/2017; Established by: Biopsy-proven; metastasis  to T4. Surgeries: Left lower lobectomy and left upper lobe wedge resection, with stapling of apical bleb, 05/11/2016. AAA stent graft 2017. Chemotherapy: Yes; Ongoing? No; Most recent administration: 06/30/2020 Immunotherapy?  Yes; Type: Keytruda; Ongoing? No Radiation therapy? No Other Cancer Therapies: Lumakras daily. EXAM: CT CHEST, ABDOMEN AND PELVIS WITHOUT CONTRAST TECHNIQUE: Multidetector CT imaging of the chest, abdomen and pelvis was performed following the standard protocol without IV contrast. COMPARISON:  Most recent CT chest, abdomen and pelvis 10/17/2020. 11/12/2017 PET-CT. FINDINGS: CT CHEST FINDINGS Cardiovascular: Right chest port catheter. Aortic atherosclerosis. 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 Normal heart size. No pericardial effusion. Mediastinum/Nodes: No enlarged mediastinal, hilar, or axillary lymph nodes. Small hiatal hernia. Thyroid gland, trachea, and esophagus demonstrate no significant findings. Lungs/Pleura: Moderate centrilobular and paraseptal emphysema. Redemonstrated postoperative findings of left lower lobectomy and posterior left upper lobe wedge resection. Irregular nodular opacities seen on prior examination are diminished or resolved compared to prior examination, an index nodule of the anterior right upper lobe measuring no greater than 0.5 x 0.3 cm, previously 0.8 x 0.6 cm (series 6, image 77) and an index nodule of the peripheral left upper lobe measuring 0.7 x 0 point 4 cm, previously 1.0 x 0.5 cm (series 6, image 75). Pleural thickening and calcification of the dependent right pleura. Trace, chronic, loculated left pleural effusion. Musculoskeletal: No chest wall mass. Unchanged, mildly expansile sclerotic lesion of the spinous process of T4 (series 6, image 45). CT ABDOMEN PELVIS FINDINGS Hepatobiliary: No solid liver abnormality is seen. No gallstones, gallbladder wall thickening, or biliary dilatation.  Pancreas: Unremarkable. No pancreatic ductal dilatation or surrounding inflammatory changes. Spleen: Normal in size without significant abnormality. Adrenals/Urinary Tract: Adrenal glands are unremarkable. Kidneys are normal, without renal calculi, solid lesion, or hydronephrosis. Bladder is unremarkable. Stomach/Bowel: Stomach is within normal limits. Appendix appears normal. No evidence of bowel wall thickening, distention, or inflammatory changes. Vascular/Lymphatic: Aortic atherosclerosis. Status post aortobiiliac stent endograft repair. No enlarged abdominal or pelvic lymph nodes. Reproductive: No mass or other abnormality. Other: No abdominal wall hernia or abnormality. No abdominopelvic ascites. Musculoskeletal: No acute or significant osseous findings. IMPRESSION: 1. Redemonstrated postoperative findings of left lower lobectomy and posterior left upper lobe wedge resection. 2. Irregular nodular opacities seen on prior examination are diminished or resolved compared to prior examination, most consistent with treatment response of pulmonary metastatic disease. Resolving infection or inflammation remain differential considerations. 3. No noncontrast evidence of new metastatic disease in the chest, abdomen, or pelvis. 4. Unchanged, mildly expansile sclerotic lesion of the spinous process of T4, of uncertain significance. No additional findings to suggest osseous metastatic disease. 5. 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 Recommend annual imaging followup  by CTA or MRA. This recommendation follows 2010 ACCF/AHA/AATS/ACR/ASA/SCA/SCAI/SIR/STS/SVM Guidelines for the Diagnosis and Management of Patients with Thoracic Aortic Disease. Circulation. 2010; 121: Y814-G818. Aortic aneurysm NOS (ICD10-I71.9) 6. Emphysema and diffuse bilateral bronchial wall thickening. 7. Status post aortobiiliac stent endograft repair. Aortic Atherosclerosis  (ICD10-I70.0) and Emphysema (ICD10-J43.9). Electronically Signed   By: Eddie Candle M.D.   On: 12/28/2020 11:01   DG Chest Port 1 View  Result Date: 12/28/2020 CLINICAL DATA:  65 year old male with history of shortness of breath. History of left lower lobe lung cancer undergoing treatment. EXAM: PORTABLE CHEST 1 VIEW COMPARISON:  Chest x-ray 11/29/2016. FINDINGS: Right-sided internal jugular single-lumen porta cath with tip terminating at the superior cavoatrial junction. Status post left lower lobectomy with compensatory hyperexpansion of the left upper lobe. Lung volumes are normal. No consolidative airspace disease. No pleural effusions. No pneumothorax. No pulmonary nodule or mass noted. Pulmonary vasculature and the cardiomediastinal silhouette are within normal limits. Atherosclerosis in the thoracic aorta. IMPRESSION: 1. No radiographic evidence of acute cardiopulmonary disease. 2. Emphysema. 3. Status post left lower lobectomy. 4. Aortic atherosclerosis. Electronically Signed   By: Vinnie Langton M.D.   On: 12/28/2020 14:49   ECHOCARDIOGRAM COMPLETE  Result Date: 12/29/2020    ECHOCARDIOGRAM REPORT   Patient Name:   William SOLOWAY [name] Date of Exam: 12/29/2020 Medical Rec #:  [id number]         Height:       75.0 in Accession #:    [id number]        Weight:       143.0 lb Date of Birth:  03-26-1956         BSA:          1.903 m Patient Age:    3 years          BP:           112/63 mmHg Patient Gender: M                 HR:           60 bpm. Exam Location:  Inpatient Procedure: 2D Echo, Cardiac Doppler and Color Doppler Indications:    [place] respiratory distress R06.03  History:        Patient has prior history of Echocardiogram examinations, most                 recent 06/08/2020. Previous Myocardial Infarction; Risk                 Factors:Hypertension.  Sonographer:    Bernadene Person RDCS Referring Phys: [id number] [place]  1. Left ventricular ejection fraction, by estimation, is  60 to 65%. The left ventricle has normal function. The left ventricle has no regional wall motion abnormalities. There is severe asymmetric left ventricular hypertrophy of the basal-septal segment. Left ventricular diastolic parameters are indeterminate.  2. Right ventricular systolic function is normal. The right ventricular size is normal. There is moderately elevated pulmonary artery systolic pressure. The estimated right ventricular systolic pressure is 86.7 mmHg.  3. The aortic valve is normal in structure. Aortic valve regurgitation is trivial. No aortic stenosis is present.  4. Aortic dilatation noted. There is dilatation of the aortic root, measuring 45 mm. There is dilatation of the ascending aorta, measuring 42 mm.  5. The mitral valve is myxomatous. Moderate to severe mitral valve regurgitation. Significant thickening and prolapse of anterior leaflet causing eccentric posterior directed MR jet. Difficult to quantify MR  given eccentric jet, but no LA/LV dilatation and E-wave velocity less than 1.2 m/s argues against severe MR. Would consider TEE or cardiac MRI to quantify MR severity FINDINGS  Left Ventricle: Left ventricular ejection fraction, by estimation, is 60 to 65%. The left ventricle has normal function. The left ventricle has no regional wall motion abnormalities. The left ventricular internal cavity size was normal in size. There is  severe asymmetric left ventricular hypertrophy of the basal-septal segment. Left ventricular diastolic parameters are indeterminate. Right Ventricle: The right ventricular size is normal. No increase in right ventricular wall thickness. Right ventricular systolic function is normal. There is moderately elevated pulmonary artery systolic pressure. The tricuspid regurgitant velocity is 3.07 m/s, and with an assumed right atrial pressure of 15 mmHg, the estimated right ventricular systolic pressure is 32.1 mmHg. Left Atrium: Left atrial size was normal in size. Right  Atrium: Right atrial size was normal in size. Pericardium: Trivial pericardial effusion is present. Mitral Valve: The mitral valve is myxomatous. There is severe thickening of the mitral valve leaflet(s). Moderate to severe mitral valve regurgitation. Tricuspid Valve: The tricuspid valve is normal in structure. Tricuspid valve regurgitation is trivial. Aortic Valve: The aortic valve is normal in structure. Aortic valve regurgitation is trivial. No aortic stenosis is present. Pulmonic Valve: The pulmonic valve was grossly normal. Pulmonic valve regurgitation is trivial. Aorta: Aortic dilatation noted. There is dilatation of the aortic root, measuring 45 mm. There is dilatation of the ascending aorta, measuring 42 mm. IAS/Shunts: The interatrial septum was not well visualized.  LEFT VENTRICLE PLAX 2D LVIDd:         5.00 cm  Diastology LVIDs:         2.90 cm  LV e' medial:    6.75 cm/s LV PW:         1.10 cm  LV E/e' medial:  16.0 LV IVS:        1.30 cm  LV e' lateral:   9.45 cm/s LVOT diam:     2.60 cm  LV E/e' lateral: 11.4 LV SV:         101 LV SV Index:   53 LVOT Area:     5.31 cm  RIGHT VENTRICLE RV S prime:     12.90 cm/s TAPSE (M-mode): 2.3 cm LEFT ATRIUM             Index       RIGHT ATRIUM           Index LA diam:        2.20 cm 1.16 cm/m  RA Area:     19.40 cm LA Vol (A2C):   45.3 ml 23.81 ml/m RA Volume:   51.00 ml  26.80 ml/m LA Vol (A4C):   38.7 ml 20.34 ml/m LA Biplane Vol: 46.3 ml 24.33 ml/m  AORTIC VALVE LVOT Vmax:   97.30 cm/s LVOT Vmean:  60.400 cm/s LVOT VTI:    0.191 m  AORTA Ao Root diam: 4.50 cm Ao Asc diam:  4.20 cm MITRAL VALVE                TRICUSPID VALVE MV Area (PHT): 4.68 cm     TR Peak grad:   37.7 mmHg MV Decel Time: 162 msec     TR Vmax:        307.00 cm/s MR PISA:        1.57 cm MR PISA Radius: 0.50 cm     SHUNTS MV E velocity: 108.00 cm/s  Systemic VTI:  0.19 m MV A velocity: 85.90 cm/s   Systemic Diam: 2.60 cm MV E/A ratio:  1.26 Oswaldo Milian MD Electronically signed  by Oswaldo Milian MD Signature Date/Time: 12/29/2020/6:38:01 PM    Final      ASSESSMENT/PLAN:  This is a very pleasant 66 year old African American male with metastatic disease who was initially diagnosed with stage IB non-small cell lung cancer, adenocarcinoma of the left upper lobe. Molecular studies showed KRAS G12C mutation which can be targetted in the second line setting. PD-L1 expression was negative. First diagnosed in December 2017.   He was status post a wedge resection of the left upper lobe followed by 4 cycles of adjuvant systemic chemotherapy with cisplatin and Alimta. He tolerated treatment well except for fatigue. He had been on observation until June of 2018. 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   He then was undergoing systemic chemotherapy with carboplatin, alimta, and keytruda. He is status post 47 cycles. Starting from cycle #5, he had been on maintenance Alimta and Keytruda every 3 weeks. His Alimta had been held during a few cycles of treatment due to renal insufficiency. It was ultimately discontinued due to renal insufficiency. Beryle Flock was eventually discontinued due to disease progression.   He then started on Targeted treatment with Lumakras 960 mg p.o. daily. His first dose was on 10/29/20. He is status post 2 months of treatment but his treatment had been on hold a few weeks because of liver dysfunction.  Treatment was resumed on 01/05/2021 at a lower dose 480 milligrams p.o. daily.  This was on hold again after 1 week due to evidence of recurrent liver dysfunction.  The patient was seen with Dr. Julien Nordmann today.  Labs were reviewed. His LFTs are improving. Recommend that he continue the prednisone taper. Dr. Julien Nordmann would like him to resume treatment with Lumakras with 2 capsules (240 mg daily).   We will continue to monitor  his labs closely every week. If he shows signs of liver dysfunction again, Dr. Julien Nordmann states we will have to permanently have to discontinue treatment with Lumakras.   We will see him back for a follow up visit in 2 weeks for evaluation and repeat blood work.   The patient was advised to call immediately if he has any concerning symptoms in the interval. The patient voices understanding of current disease status and treatment options and is in agreement with the current care plan. All questions were answered. The patient knows to call the clinic with any problems, questions or concerns. We can certainly see the patient much sooner if necessary  No orders of the defined types were placed in this encounter.   Daziya Redmond L [place] Hank, PA-C 01/19/21  ADDENDUM: Hematology/Oncology Attending: I had a face-to-face encounter with the patient today.  I reviewed his record, lab and recommended his care plan.  This is a very pleasant 65 years old African-American male with metastatic non-small cell lung cancer that was initially diagnosed as a stage Ib non-small cell lung cancer, adenocarcinoma involving the left upper lobe in December 2017.  His molecular studies showed positive KRAS G12C mutation.  The patient had evidence for disease recurrence in June 2018 and he started systemic chemotherapy with carboplatin, Alimta and Keytruda followed by maintenance Alimta and Keytruda and then just single agent Keytruda because of the renal insufficiency for  a total of 47 cycles.  His treatment was discontinued secondary to disease progression.  He started second line treatment with Lumakras (Sotorasib) 960 mg p.o. daily on October 29, 2020.  His treatment has been interrupted recently because of significant liver dysfunction.  The patient was treated with a tapered dose of prednisone for a month before resuming his treatment on 01/05/2021 with reduced dose of Lumakras (Sotorasib) 480 mg p.o. daily.  Unfortunately he  developed significant hepatic dysfunction again.  We resumed his tapered dose of prednisone and he is currently on 40 mg p.o. daily. Repeat comprehensive metabolic panel showed significant improvement of his liver enzyme with the steroid treatment. I recommended for the patient to continue with a tapered dose of prednisone. I also recommend for the patient to resume his treatment with Lumakras (Sotorasib) but only at 240 mg p.o. daily.  We will continue to monitor his liver enzymes closely on weekly basis.  If the patient develop significant liver dysfunction again, we will discontinue this treatment and discussed with the patient other treatment options. He will come back for follow-up visit in 2 weeks for evaluation. He was advised to call immediately if he has any other concerning symptoms in the interval. The total time spent in the appointment was 30 minutes. Disclaimer: This note was dictated with voice recognition software. Similar sounding words can inadvertently be transcribed and may be missed upon review. Eilleen Kempf, MD 01/19/21

## 2021-01-18 ENCOUNTER — Telehealth: Payer: Self-pay | Admitting: Dietician

## 2021-01-18 NOTE — Telephone Encounter (Signed)
Nutrition  Spoke with patient via telephone this morning for nutrition follow-up. Patient reports he is doing well, states he got "knocked down by diarrhea a while back" but this is better. Patient reports his appetite has improved and is eating well. Patient appreciative of nutrition handouts and Ensure coupons received in the mail, says handouts have been helpful. He politely declined offer for additional coupons, reports purchasing Ensure by the case and has plenty at this time. Patient has contact information and encouraged to call with nutrition questions or concerns.

## 2021-01-19 ENCOUNTER — Inpatient Hospital Stay: Payer: Federal, State, Local not specified - PPO

## 2021-01-19 ENCOUNTER — Inpatient Hospital Stay (HOSPITAL_BASED_OUTPATIENT_CLINIC_OR_DEPARTMENT_OTHER): Payer: Federal, State, Local not specified - PPO | Admitting: Physician Assistant

## 2021-01-19 ENCOUNTER — Encounter: Payer: Self-pay | Admitting: Physician Assistant

## 2021-01-19 ENCOUNTER — Other Ambulatory Visit: Payer: Self-pay

## 2021-01-19 VITALS — BP 139/85 | HR 89 | Temp 97.6°F | Resp 20 | Ht 75.0 in | Wt 144.9 lb

## 2021-01-19 DIAGNOSIS — C3492 Malignant neoplasm of unspecified part of left bronchus or lung: Secondary | ICD-10-CM | POA: Diagnosis not present

## 2021-01-19 DIAGNOSIS — Z79899 Other long term (current) drug therapy: Secondary | ICD-10-CM | POA: Diagnosis not present

## 2021-01-19 DIAGNOSIS — Z95828 Presence of other vascular implants and grafts: Secondary | ICD-10-CM

## 2021-01-19 DIAGNOSIS — C3412 Malignant neoplasm of upper lobe, left bronchus or lung: Secondary | ICD-10-CM | POA: Diagnosis not present

## 2021-01-19 DIAGNOSIS — C3432 Malignant neoplasm of lower lobe, left bronchus or lung: Secondary | ICD-10-CM

## 2021-01-19 LAB — CBC WITH DIFFERENTIAL (CANCER CENTER ONLY)
Abs Immature Granulocytes: 0.8 10*3/uL — ABNORMAL HIGH (ref 0.00–0.07)
Basophils Absolute: 0.1 10*3/uL (ref 0.0–0.1)
Basophils Relative: 0 %
Eosinophils Absolute: 0.1 10*3/uL (ref 0.0–0.5)
Eosinophils Relative: 0 %
HCT: 41.7 % (ref 39.0–52.0)
Hemoglobin: 12.9 g/dL — ABNORMAL LOW (ref 13.0–17.0)
Immature Granulocytes: 4 %
Lymphocytes Relative: 15 %
Lymphs Abs: 2.7 10*3/uL (ref 0.7–4.0)
MCH: 29.1 pg (ref 26.0–34.0)
MCHC: 30.9 g/dL (ref 30.0–36.0)
MCV: 94.1 fL (ref 80.0–100.0)
Monocytes Absolute: 1.5 10*3/uL — ABNORMAL HIGH (ref 0.1–1.0)
Monocytes Relative: 8 %
Neutro Abs: 13.1 10*3/uL — ABNORMAL HIGH (ref 1.7–7.7)
Neutrophils Relative %: 73 %
Platelet Count: 450 10*3/uL — ABNORMAL HIGH (ref 150–400)
RBC: 4.43 MIL/uL (ref 4.22–5.81)
RDW: 15.9 % — ABNORMAL HIGH (ref 11.5–15.5)
WBC Count: 18.2 10*3/uL — ABNORMAL HIGH (ref 4.0–10.5)
nRBC: 0 % (ref 0.0–0.2)

## 2021-01-19 LAB — CMP (CANCER CENTER ONLY)
ALT: 66 U/L — ABNORMAL HIGH (ref 0–44)
AST: 34 U/L (ref 15–41)
Albumin: 3 g/dL — ABNORMAL LOW (ref 3.5–5.0)
Alkaline Phosphatase: 229 U/L — ABNORMAL HIGH (ref 38–126)
Anion gap: 10 (ref 5–15)
BUN: 29 mg/dL — ABNORMAL HIGH (ref 8–23)
CO2: 20 mmol/L — ABNORMAL LOW (ref 22–32)
Calcium: 9.2 mg/dL (ref 8.9–10.3)
Chloride: 108 mmol/L (ref 98–111)
Creatinine: 1.58 mg/dL — ABNORMAL HIGH (ref 0.61–1.24)
GFR, Estimated: 48 mL/min — ABNORMAL LOW (ref >60)
Glucose, Bld: 66 mg/dL — ABNORMAL LOW (ref 70–99)
Potassium: 4.1 mmol/L (ref 3.5–5.1)
Sodium: 138 mmol/L (ref 135–145)
Total Bilirubin: 0.6 mg/dL (ref 0.3–1.2)
Total Protein: 6.5 g/dL (ref 6.5–8.1)

## 2021-01-19 LAB — TSH: TSH: 1.651 u[IU]/mL (ref 0.320–4.118)

## 2021-01-19 MED ORDER — HEPARIN SOD (PORK) LOCK FLUSH 100 UNIT/ML IV SOLN
500.0000 [IU] | Freq: Once | INTRAVENOUS | Status: AC
  Administered 2021-01-19: 500 [IU]

## 2021-01-19 MED ORDER — SODIUM CHLORIDE 0.9% FLUSH
10.0000 mL | Freq: Once | INTRAVENOUS | Status: AC
  Administered 2021-01-19: 10 mL

## 2021-01-20 DIAGNOSIS — Z0001 Encounter for general adult medical examination with abnormal findings: Secondary | ICD-10-CM | POA: Diagnosis not present

## 2021-01-20 DIAGNOSIS — Z681 Body mass index (BMI) 19 or less, adult: Secondary | ICD-10-CM | POA: Diagnosis not present

## 2021-01-25 ENCOUNTER — Ambulatory Visit: Payer: Federal, State, Local not specified - PPO | Admitting: Physician Assistant

## 2021-01-26 ENCOUNTER — Inpatient Hospital Stay: Payer: Federal, State, Local not specified - PPO | Attending: Internal Medicine

## 2021-01-26 ENCOUNTER — Telehealth: Payer: Self-pay | Admitting: Medical Oncology

## 2021-01-26 DIAGNOSIS — R7989 Other specified abnormal findings of blood chemistry: Secondary | ICD-10-CM | POA: Insufficient documentation

## 2021-01-26 DIAGNOSIS — C7951 Secondary malignant neoplasm of bone: Secondary | ICD-10-CM | POA: Insufficient documentation

## 2021-01-26 DIAGNOSIS — C3412 Malignant neoplasm of upper lobe, left bronchus or lung: Secondary | ICD-10-CM | POA: Insufficient documentation

## 2021-01-26 DIAGNOSIS — Z79899 Other long term (current) drug therapy: Secondary | ICD-10-CM | POA: Insufficient documentation

## 2021-01-26 DIAGNOSIS — N289 Disorder of kidney and ureter, unspecified: Secondary | ICD-10-CM | POA: Insufficient documentation

## 2021-01-26 NOTE — Telephone Encounter (Signed)
R/s port flush with labs for tomorrow. Pt had missed his appt today due to diarrhea .

## 2021-01-26 NOTE — Progress Notes (Signed)
[facility] OFFICE PROGRESS NOTE  [name], [address] [place] [address]  DIAGNOSIS:  Metastatic non-small cell lung cancer, adenocarcinoma initially diagnosed as stage IB (T2a, N0, M0) non-small cell lung cancer, adenocarcinoma diagnosed in December 2017.  The patient has evidence for disease recurrence in July 2019.   Biomarker Findings Tumor Mutational Burden - TMB-Intermediate (16 Muts/Mb) Microsatellite status - MS-Stable Genomic Findings For a complete list of the genes assayed, please refer to the Appendix. KIT amplification KRAS I78M SMAD4 splice site [id number]>O BS96 I232F 7 Disease relevant genes with no reportable alterations: EGFR, ALK, BRAF, MET, RET, ERBB2, ROS1    PDL 1 expression is 0%  PRIOR THERAPY: 1) status post left lower lobectomy as well as wedge resection of the left upper lobe on 05/11/2016. 2) Adjuvant systemic chemotherapy with cisplatin 75 MG/M2 and Alimta 500 MG/M2 every 3 weeks. First dose 07/03/2016. Status post 4 cycles. 3) Systemic chemotherapy with carboplatin for AUC of 5, Alimta 500 mg/M2 and Keytruda 200 mg IV every 3 weeks.  Status post 47 cycles.  Starting from cycle #5 the patient will be treated with maintenance Alimta and Ketruda (pembrolizumab) every 3 weeks.  This treatment was discontinued secondary to disease progression.  CURRENT THERAPY: Lumakras (Sotorasib) 960 mg p.o. daily started October 29, 2020.  Status post 2 months of treatment. Treatment was on hold for a few weeks because liver dysfunction as patient received prednisone taper. Resumed at a lower dose on 01/05/21 480 mg. On hold starting from 01/12/21 due to elevated LFTs.  Treatment was resumed again on 01/19/2021 at 240 mg p.o. daily. This was discontinued again on 01/31/21  INTERVAL HISTORY: William Kales [name] 65 y.o. male returns to the clinic today for a follow-up visit accompanied by his wife.  The patient is currently undergoing targeted treatment  with Lumakras.  The patient's treatment has been on hold for several weeks initially while he completed a high-dose prednisone taper for elevated LFTs.  He then resumed treatment on 01/05/2021 at a lower dose at 480 mg p.o. daily.  He was on this for approximately 1 week before he started showing evidence of recurrent liver dysfunction. He again, was started on a prednisone taper.  He then resumed treatment on 01/19/2021 at 240 mg p.o. daily.  He was on this for approximately 1 week before he started showing significantly elevated LFTs and bilirubin earlier this week on 01/30/2021. He was started back on a high dose prednisone taper. He is currently taking 60 mg p.o. daily.   Overall, the patient is feeling unwell today.  He is reporting a sore throat and increased nonproductive cough.  He is visibly jaundiced and his eyes are yellow but has not noticed.  He denies any associated fevers, chills, or night sweats.  He denies any facial pain, nasal congestion, or increased shortness of breath.  He denies any recent sick contacts.  He denies anyone sick in his household.  He denies loss of taste and smell.  He reports fatigue. Denies altered mental status.   He denies any abdominal pain, nausea, vomiting, diarrhea, or constipation. He had diarrhea last week. He was supposed to get his weekly labs on 01/26/21 but this was cancelled because the patient was unable to make it to the clinic due to diarrhea, in which case it was rescheduled for 01/30/21.  He denies any changes with the color of his stool.  He reports dark urine.  He denies any  abdominal pain.   He is here today for evaluation and repeat blood work.    MEDICAL HISTORY: Past Medical History:  Diagnosis Date   AAA (abdominal aortic aneurysm) ([place])    AAA (abdominal aortic aneurysm) without rupture ([place] [facility]) 02/04/2014   Cancer ([place] [facility])    lung   Encounter for antineoplastic chemotherapy 06/06/2016   History of hepatitis B    HNP (herniated nucleus  pulposus), lumbar    L4 with radiculopathy   Hypertension    Lung cancer ([place]) 05/18/2016   Malignant neoplasm of lower lobe of left lung ([facility]) 04/05/2016   Mass of left lung    Mitral insufficiency 04/06/2016   This patient will eventually need MV repair if his prognosis is good from oncology standpoint. However, his lung cancer therapy  is the priority right now. His cardiac condition won't preclude possible lung surgery.  Once his lung cancer is under control he will need a TEE to further evaluate his mitral valve anatomy and MR severity, and also have ischemic workup as tere is evidence on calcificati   Renal insufficiency 10/09/2016   S/P partial lobectomy of lung 05/11/2016   Varicose veins of legs     ALLERGIES:  is allergic to tramadol.  MEDICATIONS:  Current Outpatient Medications  Medication Sig Dispense Refill   amoxicillin-clavulanate (AUGMENTIN) 875-125 MG tablet Take 1 tablet by mouth 2 (two) times daily. 14 tablet 0   carvedilol (COREG CR) 10 MG 24 hr capsule Take 10 mg by mouth daily.     lisinopril (ZESTRIL) 10 MG tablet Take 10 mg by mouth daily.     loperamide (IMODIUM A-D) 2 MG tablet Take 2 mg by mouth 4 (four) times daily as needed for diarrhea or loose stools. (Patient not taking: Reported on 01/19/2021)     predniSONE (DELTASONE) 10 MG tablet Take 6 tablets (60 mg) daily for 7 days, followed by 4 tablets (40 mg daily) for 7 days, followed by 2 tablets (20 mg) daily for 7 days, followed by 1 tablet (10 mg) daily for 10 days 91 tablet 0   No current facility-administered medications for this visit.    SURGICAL HISTORY:  Past Surgical History:  Procedure Laterality Date   ABDOMINAL AORTIC ENDOVASCULAR STENT GRAFT N/A 03/12/2016   Procedure: ABDOMINAL AORTIC ENDOVASCULAR STENT GRAFT;  Surgeon: Conrad Brookhaven, MD;  Location: [facility];  Service: Vascular;  Laterality: N/A;   COLONOSCOPY     INGUINAL HERNIA REPAIR Left 1975   Left inguinal hernia   INGUINAL HERNIA REPAIR  Right    IR IMAGING GUIDED PORT INSERTION  09/04/2019   LOBECTOMY Left 05/11/2016   Procedure: LEFT LOWER LOBE LOBECTOMY AND LEFT UPPER LOBE  RESECTION AND PLACEMENT OF ON-Q;  Surgeon: [name], MD;  Location: [facility];  Service: Thoracic;  Laterality: Left;   LUMBAR LAMINECTOMY  October 22, 2012   LYMPH NODE DISSECTION Left 05/11/2016   Procedure: LYMPH NODE DISSECTION;  Surgeon: [name], MD;  Location: [facility];  Service: Thoracic;  Laterality: Left;   RIGHT/LEFT HEART CATH AND CORONARY ANGIOGRAPHY N/A 03/10/2020   Procedure: RIGHT/LEFT HEART CATH AND CORONARY ANGIOGRAPHY;  Surgeon: Sherren Mocha, MD;  Location: [facility];  Service: Cardiovascular;  Laterality: N/A;   STAPLING OF BLEBS Left 05/11/2016   Procedure: STAPLING OF APICAL BLEB;  Surgeon: [name], MD;  Location: [facility];  Service: Thoracic;  Laterality: Left;   TEE WITHOUT CARDIOVERSION N/A 10/07/2019   Procedure: TRANSESOPHAGEAL ECHOCARDIOGRAM (TEE);  Surgeon: Dorothy Spark, MD;  Location[facility] ENDOSCOPY;  Service: Cardiovascular;  Laterality: N/A;   VIDEO ASSISTED THORACOSCOPY Left 05/18/2016   Procedure: VIDEO ASSISTED THORACOSCOPY WITH REMOVAL OF LEFT APICAL BLEB;  Surgeon: [name], MD;  Location: [facility];  Service: Thoracic;  Laterality: Left;   VIDEO ASSISTED THORACOSCOPY (VATS)/WEDGE RESECTION Left 05/11/2016   Procedure: LEFT VIDEO ASSISTED THORACOSCOPY (VATS);  Surgeon: [name], MD;  Location: [facility];  Service: Thoracic;  Laterality: Left;   VIDEO BRONCHOSCOPY N/A 05/11/2016   Procedure: VIDEO BRONCHOSCOPY, LEFT LUNG;  Surgeon: [name], MD;  Location: [facility];  Service: Thoracic;  Laterality: N/A;   VIDEO BRONCHOSCOPY N/A 05/18/2016   Procedure: VIDEO BRONCHOSCOPY WITH BRONCHIAL WASHING;  Surgeon: [name], MD;  Location: [facility];  Service: Thoracic;  Laterality: N/A;    REVIEW OF SYSTEMS:   Review of Systems  Constitutional: Positive for fatigue.  Positive for  decreased appetite.  Negative for appetite change, chills, fever and unexpected weight change.  HENT: Positive for sore throat.  Negative for mouth sores, nosebleeds, and trouble swallowing.   Eyes: Negative for eye problems and icterus.  Respiratory: Positive for cough.  Positive for baseline dyspnea on exertion.  Negative for hemoptysis and wheezing.   Cardiovascular: Negative for chest pain and leg swelling.  Gastrointestinal: Negative for abdominal pain, constipation, diarrhea, nausea and vomiting.  Genitourinary: Positive for dark urine. Negative for bladder incontinence, difficulty urinating, dysuria, frequency and hematuria.   Musculoskeletal: Negative for back pain, gait problem, neck pain and neck stiffness.  Skin: Negative for itching and rash.  Neurological: Negative for dizziness, extremity weakness, gait problem, headaches, light-headedness and seizures.  Hematological: Negative for adenopathy. Does not bruise/bleed easily.  Psychiatric/Behavioral: Negative for confusion, depression and sleep disturbance. The patient is not nervous/anxious.     PHYSICAL EXAMINATION:  Blood pressure 115/70, pulse 80, temperature (!) 96.2 F (35.7 C), temperature source Tympanic, resp. rate 18, weight 137 lb 1.6 oz (62.2 kg), SpO2 98 %.  ECOG PERFORMANCE STATUS: 1  Physical Exam  Constitutional: Oriented to person, place, and time and thin appearing male and in no distress.   HENT:  Head: Normocephalic and atraumatic.  Mouth/Throat: Oropharynx is clear and moist. No oropharyngeal exudate.  Eyes: Positive for yellow sclera.  Conjunctivae are normal. Right eye exhibits no discharge. Left eye exhibits no discharge. No scleral icterus.  Neck: Normal range of motion. Neck supple.  Cardiovascular: Cardiovascular: Systolic murmur auscultated in his posterior left thorax. Normal rate, regular rhythm, for and intact distal pulses.   Pulmonary/Chest: Effort normal and breath sounds normal. No respiratory  distress. No wheezes. No rales.  Abdominal: Soft. Bowel sounds are normal. Exhibits no distension and no mass. There is no tenderness.  Musculoskeletal: Normal range of motion. Exhibits no edema.  Lymphadenopathy:  Positive for submandibular adenopathy. Neurological: Alert and oriented to person, place, and time. Exhibits normal muscle tone. Gait normal. Coordination normal.  Skin: Positive for jaundice.  Skin is warm and dry. No rash noted. Not diaphoretic. No erythema. No pallor.  Psychiatric: Mood, memory and judgment normal.  Vitals reviewed.  LABORATORY DATA: Lab Results  Component Value Date   WBC 18.0 (H) 02/02/2021   HGB 11.6 (L) 02/02/2021   HCT 34.8 (L) 02/02/2021   MCV 90.4 02/02/2021   PLT 295 02/02/2021      Chemistry      Component Value Date/Time   NA 136 02/02/2021 0745   NA 132 (L) 10/05/2019 0830  NA 138 01/11/2017 1343   K 4.5 02/02/2021 0745   K 4.7 01/11/2017 1343   CL 110 02/02/2021 0745   CO2 19 (L) 02/02/2021 0745   CO2 23 01/11/2017 1343   BUN 29 (H) 02/02/2021 0745   BUN 14 10/05/2019 0830   BUN 20.2 01/11/2017 1343   CREATININE 1.03 02/02/2021 0745   CREATININE 1.6 (H) 01/11/2017 1343      Component Value Date/Time   CALCIUM 9.2 02/02/2021 0745   CALCIUM 9.3 01/11/2017 1343   ALKPHOS 571 (H) 02/02/2021 0745   ALKPHOS 89 01/11/2017 1343   AST 337 (HH) 02/02/2021 0745   AST 41 (H) 01/11/2017 1343   ALT 473 (HH) 02/02/2021 0745   ALT 27 01/11/2017 1343   BILITOT 13.9 (HH) 02/02/2021 0745   BILITOT 0.49 01/11/2017 1343       RADIOGRAPHIC STUDIES:  No results found.   ASSESSMENT/PLAN:  This is a very pleasant 65 year old African American male with metastatic disease who was initially diagnosed with stage IB non-small cell lung cancer, adenocarcinoma of the left upper lobe. Molecular studies showed KRAS G12C mutation which can be targetted in the second line setting. PD-L1 expression was negative. First diagnosed in December 2017.   He  was status post a wedge resection of the left upper lobe followed by 4 cycles of adjuvant systemic chemotherapy with cisplatin and Alimta. He tolerated treatment well except for fatigue. He had been on observation until June of 2018. 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   He then was undergoing systemic chemotherapy with carboplatin, alimta, and keytruda. He is status post 47 cycles. Starting from cycle #5, he had been on maintenance Alimta and Keytruda every 3 weeks. His Alimta had been held during a few cycles of treatment due to renal insufficiency. It was ultimately discontinued due to renal insufficiency. Beryle Flock was eventually discontinued due to disease progression.    He then started on Targeted treatment with Lumakras 960 mg p.o. daily. His first dose was on 10/29/20. He is status post 2 months of treatment but his treatment had been on hold a few weeks because of liver dysfunction.  Treatment was resumed on 01/05/2021 at a lower dose 480 milligrams p.o. daily.  This was on hold, again, after 1 week due to evidence of recurrent liver dysfunction.  This was resumed again on 01/19/2021 at a lower dose at 240 mg p.o. daily.  This was discontinued on 01/30/2021 for significant elevated LFTs.  Patient was seen with Dr. Julien Nordmann today.  Dr. Julien Nordmann discussed that we we will have to permanently discontinue lumakras.  Dr. Julien Nordmann discussed that we will arrange for restaging CT scan of the chest, abdomen and pelvis without contrast in 1 month.  At the patient has evidence of disease progression, we will have to discuss alternative therapies with chemotherapy at that time.  If the patient's exam is stable, he will continue on observation.  The patient has a poor tolerance to the oral contrast for his scans.  He will arrive 2 hours early and drink the water down  contrast.  The patient will continue to take his high-dose prednisone taper.  He should be taking 60 mg p.o. daily for 1 week followed by 40 mg p.o. daily for 1 week followed by 20 mg p.o. daily for 1 week followed by 10 mg p.o. daily  for 1 week.  For the patient's sore throat and cough, we were planning on treating him empirically with Augmentin for 7 days.  He had some submandibular adenopathy.  No thrush on exam.  No exudate.  However he was advised to seek COVID testing at his pharmacy. However, his CMP resulted which showed worsenign bilirubin at 13.9 from 10.6 a few days ago. His AST and ALT continue to be critically high, but are improving.   Given his worsening bilirubin and the fact the patient feels poorly, he was advised to go to the ER. While his labs are likely to be from his prior treatment with Lumakras, Dr. Julien Nordmann would like him to be evaluated for obstructive jaundice.   The patient was advised to call immediately if he has any concerning symptoms in the interval. The patient voices understanding of current disease status and treatment options and is in agreement with the current care plan. All questions were answered. The patient knows to call the clinic with any problems, questions or concerns. We can certainly see the patient much sooner if necessary   Orders Placed This Encounter  Procedures   CT Chest Wo Contrast    Standing Status:   Future    Standing Expiration Date:   02/02/2022    Order Specific Question:   Preferred imaging location?    Answer:   [facility]   CT Abdomen Pelvis Wo Contrast    Standing Status:   Future    Standing Expiration Date:   02/02/2022    Order Specific Question:   Preferred imaging location?    Answer:   [facility]    Order Specific Question:   Is Oral Contrast requested for this exam?    Answer:   Yes, Per Radiology protocol      William Standley L Sherran Margolis, PA-C 02/02/21  ADDENDUM: Hematology/Oncology  Attending: I had a face-to-face encounter with the patient today.  I reviewed his record, lab and recommended his care plan.  This is a very pleasant 65 years old African-American male with metastatic non-small cell lung cancer that was initially diagnosed as a stage Ib with molecular studies showing KRAS G12C mutation and negative PD-L1 expression.  The patient was initially treated with systemic chemotherapy with carboplatin, Alimta and Keytruda followed by maintenance treatment with Alimta and Keytruda and then single agent Keytruda for a total of 47 cycles discontinued secondary to disease progression. The patient was a started on second line treatment with Lumakras (Sotorasib) initially at 960 mg p.o. daily on October 29, 2020 status post 2 months of treatment with stable disease but this treatment was discontinued secondary to significant elevation of his liver enzymes.  He was treated with a tapered dose of prednisone with improvement of the liver enzymes. He resumed his treatment with Lumakras (Sotorasib) on 01/05/2021 at a dose of 480 mg p.o. daily but again he could not tolerate more than a week and has significant elevation of his liver enzymes as well as total bilirubin. We increased his dose of the prednisone to be tapered again.  He came today for reevaluation and repeat blood work.  He has some improvement of the AST and ALT but continues to have further elevation of the total bilirubin and stable alkaline phosphatase. I recommended for the patient to go to the emergency department for further evaluation of his liver dysfunction and to rule out any other underlying etiology. We will arrange for the patient to have repeat CT scan of the  chest, abdomen pelvis in 1 months for restaging of his disease and if there is no evidence for disease progression, he will continue on observation for a longer period until the following scan. The patient and his wife were in agreement with the current plan. He was  advised to call immediately if he has any other concerning symptoms in the interval. The total time spent in the appointment was 30 minutes.  Disclaimer: This note was dictated with voice recognition software. Similar sounding words can inadvertently be transcribed and may be missed upon review. Eilleen Kempf, MD 02/02/21

## 2021-01-30 ENCOUNTER — Other Ambulatory Visit: Payer: Self-pay | Admitting: Physician Assistant

## 2021-01-30 ENCOUNTER — Inpatient Hospital Stay: Payer: Federal, State, Local not specified - PPO

## 2021-01-30 ENCOUNTER — Other Ambulatory Visit: Payer: Self-pay

## 2021-01-30 DIAGNOSIS — R7989 Other specified abnormal findings of blood chemistry: Secondary | ICD-10-CM | POA: Diagnosis not present

## 2021-01-30 DIAGNOSIS — Z95828 Presence of other vascular implants and grafts: Secondary | ICD-10-CM

## 2021-01-30 DIAGNOSIS — C3412 Malignant neoplasm of upper lobe, left bronchus or lung: Secondary | ICD-10-CM | POA: Diagnosis not present

## 2021-01-30 DIAGNOSIS — C7951 Secondary malignant neoplasm of bone: Secondary | ICD-10-CM | POA: Diagnosis not present

## 2021-01-30 DIAGNOSIS — R748 Abnormal levels of other serum enzymes: Secondary | ICD-10-CM

## 2021-01-30 DIAGNOSIS — Z79899 Other long term (current) drug therapy: Secondary | ICD-10-CM | POA: Diagnosis not present

## 2021-01-30 DIAGNOSIS — C3492 Malignant neoplasm of unspecified part of left bronchus or lung: Secondary | ICD-10-CM

## 2021-01-30 DIAGNOSIS — N289 Disorder of kidney and ureter, unspecified: Secondary | ICD-10-CM | POA: Diagnosis not present

## 2021-01-30 LAB — CBC WITH DIFFERENTIAL (CANCER CENTER ONLY)
Abs Immature Granulocytes: 0.19 10*3/uL — ABNORMAL HIGH (ref 0.00–0.07)
Basophils Absolute: 0.1 10*3/uL (ref 0.0–0.1)
Basophils Relative: 0 %
Eosinophils Absolute: 0.2 10*3/uL (ref 0.0–0.5)
Eosinophils Relative: 2 %
HCT: 39.7 % (ref 39.0–52.0)
Hemoglobin: 12.9 g/dL — ABNORMAL LOW (ref 13.0–17.0)
Immature Granulocytes: 1 %
Lymphocytes Relative: 10 %
Lymphs Abs: 1.4 10*3/uL (ref 0.7–4.0)
MCH: 30.1 pg (ref 26.0–34.0)
MCHC: 32.5 g/dL (ref 30.0–36.0)
MCV: 92.5 fL (ref 80.0–100.0)
Monocytes Absolute: 1 10*3/uL (ref 0.1–1.0)
Monocytes Relative: 7 %
Neutro Abs: 10.8 10*3/uL — ABNORMAL HIGH (ref 1.7–7.7)
Neutrophils Relative %: 80 %
Platelet Count: 278 10*3/uL (ref 150–400)
RBC: 4.29 MIL/uL (ref 4.22–5.81)
RDW: 16.5 % — ABNORMAL HIGH (ref 11.5–15.5)
WBC Count: 13.6 10*3/uL — ABNORMAL HIGH (ref 4.0–10.5)
nRBC: 0 % (ref 0.0–0.2)

## 2021-01-30 LAB — CMP (CANCER CENTER ONLY)
ALT: 508 U/L (ref 0–44)
AST: 443 U/L (ref 15–41)
Albumin: 2.6 g/dL — ABNORMAL LOW (ref 3.5–5.0)
Alkaline Phosphatase: 570 U/L — ABNORMAL HIGH (ref 38–126)
Anion gap: 12 (ref 5–15)
BUN: 22 mg/dL (ref 8–23)
CO2: 17 mmol/L — ABNORMAL LOW (ref 22–32)
Calcium: 9.2 mg/dL (ref 8.9–10.3)
Chloride: 104 mmol/L (ref 98–111)
Creatinine: 1.51 mg/dL — ABNORMAL HIGH (ref 0.61–1.24)
GFR, Estimated: 50 mL/min — ABNORMAL LOW (ref >60)
Glucose, Bld: 92 mg/dL (ref 70–99)
Potassium: 4.3 mmol/L (ref 3.5–5.1)
Sodium: 133 mmol/L — ABNORMAL LOW (ref 135–145)
Total Bilirubin: 10.6 mg/dL (ref 0.3–1.2)
Total Protein: 6.8 g/dL (ref 6.5–8.1)

## 2021-01-30 MED ORDER — HEPARIN SOD (PORK) LOCK FLUSH 100 UNIT/ML IV SOLN
500.0000 [IU] | Freq: Once | INTRAVENOUS | Status: AC
  Administered 2021-01-30: 500 [IU]

## 2021-01-30 MED ORDER — SODIUM CHLORIDE 0.9% FLUSH
10.0000 mL | Freq: Once | INTRAVENOUS | Status: AC
  Administered 2021-01-30: 10 mL

## 2021-01-30 MED ORDER — PREDNISONE 10 MG PO TABS: ORAL_TABLET | ORAL | 0 refills | Status: DC

## 2021-01-30 NOTE — Progress Notes (Signed)
The patient had significant increase in his liver enzymes due to the lumakras. I called him an instructed him to discontinue this medication. He is scheduled to see me and Dr. Julien Nordmann Thursday 02/02/21. I have resent high dose prednisone taper to the pharmacy and instructed him to start taking this immediately. He expressed understanding and was able to repeat the instructions back to me.

## 2021-02-02 ENCOUNTER — Inpatient Hospital Stay (HOSPITAL_BASED_OUTPATIENT_CLINIC_OR_DEPARTMENT_OTHER): Payer: Federal, State, Local not specified - PPO | Admitting: Physician Assistant

## 2021-02-02 ENCOUNTER — Inpatient Hospital Stay: Payer: Federal, State, Local not specified - PPO

## 2021-02-02 ENCOUNTER — Emergency Department (HOSPITAL_COMMUNITY): Payer: Federal, State, Local not specified - PPO

## 2021-02-02 ENCOUNTER — Emergency Department (HOSPITAL_COMMUNITY)
Admission: EM | Admit: 2021-02-02 | Discharge: 2021-02-02 | Disposition: A | Discharge status: Home / Self Care | Payer: Federal, State, Local not specified - PPO | Attending: Emergency Medicine | Admitting: Emergency Medicine

## 2021-02-02 ENCOUNTER — Encounter (HOSPITAL_COMMUNITY): Payer: Self-pay | Admitting: Emergency Medicine

## 2021-02-02 ENCOUNTER — Other Ambulatory Visit: Payer: Self-pay

## 2021-02-02 VITALS — BP 115/70 | HR 80 | Temp 96.2°F | Resp 18 | Wt 137.1 lb

## 2021-02-02 DIAGNOSIS — I700 Atherosclerosis of aorta: Secondary | ICD-10-CM | POA: Diagnosis not present

## 2021-02-02 DIAGNOSIS — Z87891 Personal history of nicotine dependence: Secondary | ICD-10-CM | POA: Insufficient documentation

## 2021-02-02 DIAGNOSIS — Z79899 Other long term (current) drug therapy: Secondary | ICD-10-CM | POA: Diagnosis not present

## 2021-02-02 DIAGNOSIS — R7989 Other specified abnormal findings of blood chemistry: Secondary | ICD-10-CM | POA: Diagnosis not present

## 2021-02-02 DIAGNOSIS — I10 Essential (primary) hypertension: Secondary | ICD-10-CM | POA: Diagnosis not present

## 2021-02-02 DIAGNOSIS — R918 Other nonspecific abnormal finding of lung field: Secondary | ICD-10-CM | POA: Diagnosis not present

## 2021-02-02 DIAGNOSIS — Z20822 Contact with and (suspected) exposure to covid-19: Secondary | ICD-10-CM | POA: Diagnosis not present

## 2021-02-02 DIAGNOSIS — Z95828 Presence of other vascular implants and grafts: Secondary | ICD-10-CM

## 2021-02-02 DIAGNOSIS — C3492 Malignant neoplasm of unspecified part of left bronchus or lung: Secondary | ICD-10-CM | POA: Diagnosis not present

## 2021-02-02 DIAGNOSIS — J069 Acute upper respiratory infection, unspecified: Secondary | ICD-10-CM | POA: Diagnosis not present

## 2021-02-02 DIAGNOSIS — Z85118 Personal history of other malignant neoplasm of bronchus and lung: Secondary | ICD-10-CM | POA: Diagnosis not present

## 2021-02-02 DIAGNOSIS — R7401 Elevation of levels of liver transaminase levels: Secondary | ICD-10-CM | POA: Insufficient documentation

## 2021-02-02 DIAGNOSIS — R17 Unspecified jaundice: Secondary | ICD-10-CM | POA: Insufficient documentation

## 2021-02-02 DIAGNOSIS — K7689 Other specified diseases of liver: Secondary | ICD-10-CM | POA: Diagnosis not present

## 2021-02-02 DIAGNOSIS — J439 Emphysema, unspecified: Secondary | ICD-10-CM | POA: Diagnosis not present

## 2021-02-02 DIAGNOSIS — R911 Solitary pulmonary nodule: Secondary | ICD-10-CM | POA: Diagnosis not present

## 2021-02-02 DIAGNOSIS — C3490 Malignant neoplasm of unspecified part of unspecified bronchus or lung: Secondary | ICD-10-CM | POA: Diagnosis not present

## 2021-02-02 LAB — RESP PANEL BY RT-PCR (FLU A&B, COVID) ARPGX2
Influenza A by PCR: NEGATIVE
Influenza B by PCR: NEGATIVE
SARS Coronavirus 2 by RT PCR: NEGATIVE

## 2021-02-02 LAB — CMP (CANCER CENTER ONLY)
ALT: 473 U/L (ref 0–44)
AST: 337 U/L (ref 15–41)
Albumin: 2.6 g/dL — ABNORMAL LOW (ref 3.5–5.0)
Alkaline Phosphatase: 571 U/L — ABNORMAL HIGH (ref 38–126)
Anion gap: 7 (ref 5–15)
BUN: 29 mg/dL — ABNORMAL HIGH (ref 8–23)
CO2: 19 mmol/L — ABNORMAL LOW (ref 22–32)
Calcium: 9.2 mg/dL (ref 8.9–10.3)
Chloride: 110 mmol/L (ref 98–111)
Creatinine: 1.03 mg/dL (ref 0.61–1.24)
GFR, Estimated: >60 mL/min (ref >60)
Glucose, Bld: 111 mg/dL — ABNORMAL HIGH (ref 70–99)
Potassium: 4.5 mmol/L (ref 3.5–5.1)
Sodium: 136 mmol/L (ref 135–145)
Total Bilirubin: 13.9 mg/dL (ref 0.3–1.2)
Total Protein: 6.4 g/dL — ABNORMAL LOW (ref 6.5–8.1)

## 2021-02-02 LAB — CBC WITH DIFFERENTIAL (CANCER CENTER ONLY)
Abs Immature Granulocytes: 0.07 10*3/uL (ref 0.00–0.07)
Basophils Absolute: 0 10*3/uL (ref 0.0–0.1)
Basophils Relative: 0 %
Eosinophils Absolute: 0 10*3/uL (ref 0.0–0.5)
Eosinophils Relative: 0 %
HCT: 34.8 % — ABNORMAL LOW (ref 39.0–52.0)
Hemoglobin: 11.6 g/dL — ABNORMAL LOW (ref 13.0–17.0)
Immature Granulocytes: 0 %
Lymphocytes Relative: 5 %
Lymphs Abs: 0.9 10*3/uL (ref 0.7–4.0)
MCH: 30.1 pg (ref 26.0–34.0)
MCHC: 33.3 g/dL (ref 30.0–36.0)
MCV: 90.4 fL (ref 80.0–100.0)
Monocytes Absolute: 1.8 10*3/uL — ABNORMAL HIGH (ref 0.1–1.0)
Monocytes Relative: 10 %
Neutro Abs: 15.3 10*3/uL — ABNORMAL HIGH (ref 1.7–7.7)
Neutrophils Relative %: 85 %
Platelet Count: 295 10*3/uL (ref 150–400)
RBC: 3.85 MIL/uL — ABNORMAL LOW (ref 4.22–5.81)
RDW: 17.3 % — ABNORMAL HIGH (ref 11.5–15.5)
WBC Count: 18 10*3/uL — ABNORMAL HIGH (ref 4.0–10.5)
nRBC: 0 % (ref 0.0–0.2)

## 2021-02-02 LAB — AMMONIA: Ammonia: 40 umol/L — ABNORMAL HIGH (ref 9–35)

## 2021-02-02 MED ORDER — SODIUM CHLORIDE 0.9% FLUSH
10.0000 mL | Freq: Once | INTRAVENOUS | Status: AC
  Administered 2021-02-02: 10 mL

## 2021-02-02 MED ORDER — HEPARIN SOD (PORK) LOCK FLUSH 100 UNIT/ML IV SOLN
500.0000 [IU] | Freq: Once | INTRAVENOUS | Status: AC
Start: 2021-02-02 — End: 2021-02-02
  Administered 2021-02-02: 500 [IU]

## 2021-02-02 MED ORDER — AMOXICILLIN-POT CLAVULANATE 875-125 MG PO TABS: 1.0000 | ORAL_TABLET | Freq: Two times a day (BID) | ORAL | 0 refills | Status: DC

## 2021-02-02 MED ORDER — LACTATED RINGERS IV BOLUS
1000.0000 mL | Freq: Once | INTRAVENOUS | Status: AC
  Administered 2021-02-02: 1000 mL via INTRAVENOUS

## 2021-02-02 MED ORDER — LIDOCAINE VISCOUS HCL 2 % MT SOLN
15.0000 mL | Freq: Once | OROMUCOSAL | Status: AC
  Administered 2021-02-02: 15 mL via OROMUCOSAL
  Filled 2021-02-02: qty 15

## 2021-02-02 NOTE — Progress Notes (Unsigned)
TB  13.9 AST  337 ALT  473 called to diane bell at 0905 by HFLYNT 02/02/21

## 2021-02-02 NOTE — ED Provider Notes (Signed)
[facility] DEPT Provider Note   CSN: [id number] Arrival date & time: 02/02/21  1005     History Chief Complaint  Patient presents with   Abnormal Lab    [name] [name] is a 65 y.o. male.  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  Patient most recently discontinued Lumakras last week due to elevated LFTs despite decreasing the dose.  He was placed on 60 mg prednisone burst which she has been taking.  Last week he did have significant diarrhea which he reports is a result of that medication which has improved and his appetite and oral intake has increased this week but 2 days ago he started having sore throat and a cough.  He did have some mucus and sputum with the cough today only.  He does not feel more short of breath than his baseline.  He had not noticed that he had become yellow but he went to his doctor's appointment today and they noted him to be more significantly jaundiced now with URI symptoms and they did labs in the office that showed improving LFTs in the 3 and 400s but worsening total bilirubin now at 13 from 10 a few days ago.  Patient denies taking any Tylenol.  He has no abdominal pain nausea or vomiting.  He does note that his urine has been dark but denies any dysuria.  Patient was sent here from his oncologist's office for further evaluation to ensure he did not have obstructive jaundice.  The history is provided by the patient and medical records.  Abnormal Lab     Past Medical History:  Diagnosis Date   AAA (abdominal aortic aneurysm)    AAA (abdominal aortic aneurysm) without rupture 02/04/2014   Cancer ([place])    lung   Encounter for antineoplastic chemotherapy  06/06/2016   History of hepatitis B    HNP (herniated nucleus pulposus), lumbar    L4 with radiculopathy   Hypertension    Lung cancer ([place]) 05/18/2016   Malignant neoplasm of lower lobe of left lung ([place]) 04/05/2016   Mass of left lung    Mitral insufficiency 04/06/2016   This patient will eventually need MV repair if his prognosis is good from oncology standpoint. However, his lung cancer therapy  is the priority right now. His cardiac condition won't preclude possible lung surgery.  Once his lung cancer is under control he will need a TEE to further evaluate his mitral valve anatomy and MR severity, and also have ischemic workup as tere is evidence on calcificati   Renal insufficiency 10/09/2016   S/P partial lobectomy of lung 05/11/2016   Varicose veins of legs     Patient Active Problem List   Diagnosis Date Noted   Elevated LFTs 01/05/2021   SOB (shortness of breath) 12/28/2020   Hypotension    Fever    Troponin I above reference range    Dehydration 07/28/2020   Port-A-Cath in place 10/22/2019   Encounter for antineoplastic immunotherapy 01/09/2018   Adenocarcinoma of left lung, stage 4 ([place]) 12/12/2017   Renal insufficiency 10/09/2016   Hypertension 08/14/2016   Goals of care, counseling/discussion 06/26/2016   Encounter for antineoplastic chemotherapy 06/06/2016   Lung cancer ([place]) 05/18/2016   S/P  partial lobectomy of lung 05/11/2016   Severe mitral regurgitation 04/06/2016   Malignant neoplasm of lower lobe of left lung ([place]) 04/05/2016   AAA (abdominal aortic aneurysm) 03/12/2016   AAA (abdominal aortic aneurysm) without rupture 02/04/2014    Past Surgical History:  Procedure Laterality Date   ABDOMINAL AORTIC ENDOVASCULAR STENT GRAFT N/A 03/12/2016   Procedure: ABDOMINAL AORTIC ENDOVASCULAR STENT GRAFT;  Surgeon: Conrad Armstrong, MD;  Location: [facility];  Service: Vascular;  Laterality: N/A;   COLONOSCOPY     INGUINAL HERNIA REPAIR Left 1975   Left inguinal hernia    INGUINAL HERNIA REPAIR Right    IR IMAGING GUIDED PORT INSERTION  09/04/2019   LOBECTOMY Left 05/11/2016   Procedure: LEFT LOWER LOBE LOBECTOMY AND LEFT UPPER LOBE  RESECTION AND PLACEMENT OF ON-Q;  Surgeon: [name], MD;  Location: [facility];  Service: Thoracic;  Laterality: Left;   LUMBAR LAMINECTOMY  October 22, 2012   LYMPH NODE DISSECTION Left 05/11/2016   Procedure: LYMPH NODE DISSECTION;  Surgeon: [name], MD;  Location: [facility];  Service: Thoracic;  Laterality: Left;   RIGHT/LEFT HEART CATH AND CORONARY ANGIOGRAPHY N/A 03/10/2020   Procedure: RIGHT/LEFT HEART CATH AND CORONARY ANGIOGRAPHY;  Surgeon: Sherren Mocha, MD;  Location: [facility];  Service: Cardiovascular;  Laterality: N/A;   STAPLING OF BLEBS Left 05/11/2016   Procedure: STAPLING OF APICAL BLEB;  Surgeon: [name], MD;  Location: [place];  Service: Thoracic;  Laterality: Left;   TEE WITHOUT CARDIOVERSION N/A 10/07/2019   Procedure: TRANSESOPHAGEAL ECHOCARDIOGRAM (TEE);  Surgeon: Dorothy Spark, MD;  Location: [facility] ENDOSCOPY;  Service: Cardiovascular;  Laterality: N/A;   VIDEO ASSISTED THORACOSCOPY Left 05/18/2016   Procedure: VIDEO ASSISTED THORACOSCOPY WITH REMOVAL OF LEFT APICAL BLEB;  Surgeon: [name], MD;  Location: [facility];  Service: Thoracic;  Laterality: Left;   VIDEO ASSISTED THORACOSCOPY (VATS)/WEDGE RESECTION Left 05/11/2016   Procedure: LEFT VIDEO ASSISTED THORACOSCOPY (VATS);  Surgeon: [name], MD;  Location: [place];  Service: Thoracic;  Laterality: Left;   VIDEO BRONCHOSCOPY N/A 05/11/2016   Procedure: VIDEO BRONCHOSCOPY, LEFT LUNG;  Surgeon: [name], MD;  Location: [facility];  Service: Thoracic;  Laterality: N/A;   VIDEO BRONCHOSCOPY N/A 05/18/2016   Procedure: VIDEO BRONCHOSCOPY WITH BRONCHIAL WASHING;  Surgeon: [name], MD;  Location: [facility];  Service: Thoracic;  Laterality: N/A;       Family History  Problem Relation Age of Onset   Diabetes Mother      Social History   Tobacco Use   Smoking status: Former    Packs/day: 1.00    Types: Cigarettes    Quit date: 03/05/2016    Years since quitting: 4.9   Smokeless tobacco: Never  Vaping Use   Vaping Use: Never used  Substance Use Topics   Alcohol use: Yes    Alcohol/week: 6.0 standard drinks    Types: 6 Cans of beer per week   Drug use: Yes    Types: Cocaine, Heroin    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 Authorizing Provider  amoxicillin-clavulanate (AUGMENTIN) 875-125 MG tablet Take 1 tablet by mouth 2 (two) times daily. 02/02/21   Heilingoetter, Cassandra L, PA-C  carvedilol (COREG CR) 10 MG 24 hr capsule Take 10 mg by mouth daily.    [provider]  lisinopril (ZESTRIL) 10 MG tablet Take  10 mg by mouth daily.    [provider]  loperamide (IMODIUM A-D) 2 MG tablet Take 2 mg by mouth 4 (four) times daily as needed for diarrhea or loose stools. Patient not taking: Reported on 01/19/2021    [provider]  predniSONE (DELTASONE) 10 MG tablet Take 6 tablets (60 mg) daily for 7 days, followed by 4 tablets (40 mg daily) for 7 days, followed by 2 tablets (20 mg) daily for 7 days, followed by 1 tablet (10 mg) daily for 10 days 01/30/21   Heilingoetter, Cassandra L, PA-C    Allergies    Tramadol  Review of Systems   Review of Systems  All other systems reviewed and are negative.  Physical Exam Updated Vital Signs BP 120/71 (BP Location: Left Arm)   Pulse 84   Temp 98 F (36.7 C) (Oral)   Resp 18   SpO2 96%   Physical Exam Vitals and nursing note reviewed.  Constitutional:      General: He is not in acute distress.    Appearance: He is well-developed and underweight. He is ill-appearing.     Comments: Chronically ill-appearing male  HENT:     Head: Normocephalic and atraumatic.     Nose: Nose normal.     Mouth/Throat:     Mouth: Mucous  membranes are moist.     Comments: Some mucus noted in the posterior pharynx.  No lesions over the tonsils.  No evidence of thrush Eyes:     General: Scleral icterus present.     Conjunctiva/sclera: Conjunctivae normal.     Pupils: Pupils are equal, round, and reactive to light.  Cardiovascular:     Rate and Rhythm: Normal rate and regular rhythm.     Pulses: Normal pulses.     Heart sounds: No murmur heard. Pulmonary:     Effort: Pulmonary effort is normal. No respiratory distress.     Breath sounds: Normal breath sounds. No wheezing or rales.  Chest:     Chest wall: No tenderness.  Abdominal:     General: There is no distension.     Palpations: Abdomen is soft.     Tenderness: There is no abdominal tenderness. There is no right CVA tenderness, left CVA tenderness, guarding or rebound.  Musculoskeletal:        General: No tenderness. Normal range of motion.     Cervical back: Normal range of motion and neck supple.  Skin:    General: Skin is warm and dry.     Capillary Refill: Capillary refill takes less than 2 seconds.     Coloration: Skin is jaundiced.     Findings: No erythema or rash.  Neurological:     Mental Status: He is alert and oriented to person, place, and time. Mental status is at baseline.  Psychiatric:        Mood and Affect: Mood normal.        Behavior: Behavior normal.    ED Results / Procedures / Treatments   Labs (all labs ordered are listed, but only abnormal results are displayed) Labs Reviewed  RESP PANEL BY RT-PCR (FLU A&B, COVID) [id number]    EKG None  Radiology No results found.  Procedures Procedures   Medications Ordered in ED Medications  lactated ringers bolus 1,000 mL (has no administration in time range)    ED Course  I have reviewed the triage vital signs and the nursing notes.  Pertinent labs & imaging results that were available during my  care of the patient were reviewed by me and considered in my medical decision making (see  chart for details).    MDM Rules/Calculators/A&P                           Patient is a 65 year old male with a complex past medical history currently on Lumakras but recurrent transaminitis even despite ongoing decreased doses due to his progressive lung cancer.  Patient is currently on a prednisone burst and has been off of the medication for 1 week but total bilirubin is worsening despite mild improvement in LFTs.  He is also complaining of upper respiratory symptoms with cough and sore throat in the last 2 days.  He was seen in his oncology office today and they recommended he go to the emergency room for further evaluation.  Patient is satting 96% on room air.  Blood pressure and pulse are within normal limits and he is afebrile.  Labs done today showed a white count of 18,000 but patient is on prednisone, stable hemoglobin of 11 and normal platelet count.  CMP today with normal sodium potassium and chloride.  Creatinine is improved at 1 and calcium was normal.  AST today is 337 from 443 3 days ago and ALT is 473 from 508 3 days ago.  Alkaline phosphatase continues to be elevated at 571 without significant change in today total bilirubin is 13.9 from 10.63 days ago.  There was concern for possible and obstructive jaundice and patient was sent here to rule out.  He is having no abdominal pain nausea or vomiting.  He is chronically ill-appearing but appears stable on exam.  Patient given IV fluids.  He denies taking any Tylenol products.  He does report eating and drinking more in the last week but in the last 2 days he has not eaten anything because his throat has been too sore.  We will check for COVID and flu as well.  Patient has no stridor and is tolerating his secretions.  2:37 PM Patient's imaging today shows no evidence of obstructive causes for his jaundice.  CT of the chest without acute new findings.  Ammonia level is only 40 and COVID swab is still pending.  Spoke with Dr. Earlie Server patient's  oncologist who feels that it is stable for him to be discharged home and his symptoms are most likely a result of the medication.  Patient will continue to use the prednisone.  He was also given a prescription for Augmentin from his oncologist for his new URI symptoms.  He does have Chloraseptic spray at home and recommend trying benzocaine lozenges.  He was given viscous lidocaine here for his sore throat.  He and his wife were given return precautions.  Encouraged follow-up on Monday if he continues to have sore throat.  MDM   Amount and/or Complexity of Data Reviewed Clinical lab tests: ordered and reviewed Tests in the radiology section of CPT: ordered and reviewed Independent visualization of images, tracings, or specimens: yes    Final Clinical Impression(s) / ED Diagnoses Final diagnoses:  Transaminitis  Jaundice  Viral upper respiratory tract infection    Rx / DC Orders ED Discharge Orders     None        Blanchie Dessert, MD 02/02/21 1440

## 2021-02-02 NOTE — ED Triage Notes (Signed)
Patient here from home reporting that he got a call this morning about elevated liver enzyme lab results, lung cancer patient.

## 2021-02-02 NOTE — Discharge Instructions (Addendum)
Continue to take the prednisone and you can start the antibiotic that was given to you by your oncology office.  He could try the Chloraseptic spray but you could also try benzocaine lozenges for your sore throat.  If you start having high fever, confusion, persistent vomiting or new abdominal pain return to the emergency room.  If you continue to have a sore throat by Monday you should call the oncologist office for recheck.  Your COVID test should come back sometime later today and if the test results are positive they should be able to call you in a medication.  Continue to hold the Lumakras.

## 2021-02-02 NOTE — ED Notes (Signed)
Patient transported to CT 

## 2021-02-02 NOTE — ED Notes (Signed)
Pt ambulatory in ED lobby. 

## 2021-02-09 ENCOUNTER — Other Ambulatory Visit: Payer: Self-pay

## 2021-02-09 ENCOUNTER — Inpatient Hospital Stay: Payer: Federal, State, Local not specified - PPO

## 2021-02-09 ENCOUNTER — Telehealth: Payer: Self-pay

## 2021-02-09 DIAGNOSIS — N289 Disorder of kidney and ureter, unspecified: Secondary | ICD-10-CM | POA: Diagnosis not present

## 2021-02-09 DIAGNOSIS — R7989 Other specified abnormal findings of blood chemistry: Secondary | ICD-10-CM | POA: Diagnosis not present

## 2021-02-09 DIAGNOSIS — C3412 Malignant neoplasm of upper lobe, left bronchus or lung: Secondary | ICD-10-CM | POA: Diagnosis not present

## 2021-02-09 DIAGNOSIS — C3492 Malignant neoplasm of unspecified part of left bronchus or lung: Secondary | ICD-10-CM

## 2021-02-09 DIAGNOSIS — Z95828 Presence of other vascular implants and grafts: Secondary | ICD-10-CM

## 2021-02-09 DIAGNOSIS — C7951 Secondary malignant neoplasm of bone: Secondary | ICD-10-CM | POA: Diagnosis not present

## 2021-02-09 DIAGNOSIS — Z79899 Other long term (current) drug therapy: Secondary | ICD-10-CM | POA: Diagnosis not present

## 2021-02-09 DIAGNOSIS — C3432 Malignant neoplasm of lower lobe, left bronchus or lung: Secondary | ICD-10-CM

## 2021-02-09 LAB — CMP (CANCER CENTER ONLY)
ALT: 647 U/L (ref 0–44)
AST: 257 U/L (ref 15–41)
Albumin: 2.3 g/dL — ABNORMAL LOW (ref 3.5–5.0)
Alkaline Phosphatase: 641 U/L — ABNORMAL HIGH (ref 38–126)
Anion gap: 10 (ref 5–15)
BUN: 30 mg/dL — ABNORMAL HIGH (ref 8–23)
CO2: 17 mmol/L — ABNORMAL LOW (ref 22–32)
Calcium: 8.6 mg/dL — ABNORMAL LOW (ref 8.9–10.3)
Chloride: 106 mmol/L (ref 98–111)
Creatinine: 1.47 mg/dL — ABNORMAL HIGH (ref 0.61–1.24)
GFR, Estimated: 52 mL/min — ABNORMAL LOW (ref >60)
Glucose, Bld: 141 mg/dL — ABNORMAL HIGH (ref 70–99)
Potassium: 4 mmol/L (ref 3.5–5.1)
Sodium: 133 mmol/L — ABNORMAL LOW (ref 135–145)
Total Bilirubin: 13.6 mg/dL (ref 0.3–1.2)
Total Protein: 6.1 g/dL — ABNORMAL LOW (ref 6.5–8.1)

## 2021-02-09 LAB — TSH: TSH: 1.092 u[IU]/mL (ref 0.320–4.118)

## 2021-02-09 MED ORDER — HEPARIN SOD (PORK) LOCK FLUSH 100 UNIT/ML IV SOLN
500.0000 [IU] | Freq: Once | INTRAVENOUS | Status: AC
  Administered 2021-02-09: 500 [IU]

## 2021-02-09 MED ORDER — SODIUM CHLORIDE 0.9% FLUSH
10.0000 mL | Freq: Once | INTRAVENOUS | Status: AC
  Administered 2021-02-09: 10 mL

## 2021-02-09 NOTE — Telephone Encounter (Signed)
CRITICAL VALUE STICKER  CRITICAL VALUE: Total Bili = 13.6, AST = 259 and ALT = 647  RECEIVER (on-site recipient of call): Yetta Glassman, CMA  DATE & TIME NOTIFIED: 02/09/21 at 11:10a  MESSENGER (representative from lab): Hillary  MD NOTIFIED: Cassandra PA-C  TIME OF NOTIFICATION: 02/09/21 at 11:25a  RESPONSE: Notification given to [place] PA-C who advises to submit a referral to GI and for pt to continue his steroids.

## 2021-02-15 ENCOUNTER — Ambulatory Visit: Payer: Federal, State, Local not specified - PPO | Admitting: Internal Medicine

## 2021-02-15 ENCOUNTER — Inpatient Hospital Stay: Payer: Federal, State, Local not specified - PPO

## 2021-02-15 ENCOUNTER — Other Ambulatory Visit: Payer: Self-pay

## 2021-02-15 DIAGNOSIS — Z95828 Presence of other vascular implants and grafts: Secondary | ICD-10-CM

## 2021-02-15 DIAGNOSIS — Z79899 Other long term (current) drug therapy: Secondary | ICD-10-CM | POA: Diagnosis not present

## 2021-02-15 DIAGNOSIS — R7989 Other specified abnormal findings of blood chemistry: Secondary | ICD-10-CM | POA: Diagnosis not present

## 2021-02-15 DIAGNOSIS — C3492 Malignant neoplasm of unspecified part of left bronchus or lung: Secondary | ICD-10-CM

## 2021-02-15 DIAGNOSIS — C3412 Malignant neoplasm of upper lobe, left bronchus or lung: Secondary | ICD-10-CM | POA: Diagnosis not present

## 2021-02-15 DIAGNOSIS — C7951 Secondary malignant neoplasm of bone: Secondary | ICD-10-CM | POA: Diagnosis not present

## 2021-02-15 DIAGNOSIS — N289 Disorder of kidney and ureter, unspecified: Secondary | ICD-10-CM | POA: Diagnosis not present

## 2021-02-15 LAB — CMP (CANCER CENTER ONLY)
ALT: 436 U/L (ref 0–44)
AST: 173 U/L (ref 15–41)
Albumin: 2.4 g/dL — ABNORMAL LOW (ref 3.5–5.0)
Alkaline Phosphatase: 460 U/L — ABNORMAL HIGH (ref 38–126)
Anion gap: 10 (ref 5–15)
BUN: 45 mg/dL — ABNORMAL HIGH (ref 8–23)
CO2: 17 mmol/L — ABNORMAL LOW (ref 22–32)
Calcium: 8.8 mg/dL — ABNORMAL LOW (ref 8.9–10.3)
Chloride: 109 mmol/L (ref 98–111)
Creatinine: 1.84 mg/dL — ABNORMAL HIGH (ref 0.61–1.24)
GFR, Estimated: 40 mL/min — ABNORMAL LOW (ref >60)
Glucose, Bld: 85 mg/dL (ref 70–99)
Potassium: 4.4 mmol/L (ref 3.5–5.1)
Sodium: 136 mmol/L (ref 135–145)
Total Bilirubin: 7.8 mg/dL (ref 0.3–1.2)
Total Protein: 6.2 g/dL — ABNORMAL LOW (ref 6.5–8.1)

## 2021-02-15 LAB — CBC WITH DIFFERENTIAL (CANCER CENTER ONLY)
Abs Immature Granulocytes: 1.15 10*3/uL — ABNORMAL HIGH (ref 0.00–0.07)
Basophils Absolute: 0 10*3/uL (ref 0.0–0.1)
Basophils Relative: 0 %
Eosinophils Absolute: 0 10*3/uL (ref 0.0–0.5)
Eosinophils Relative: 0 %
HCT: 34.9 % — ABNORMAL LOW (ref 39.0–52.0)
Hemoglobin: 11.7 g/dL — ABNORMAL LOW (ref 13.0–17.0)
Immature Granulocytes: 5 %
Lymphocytes Relative: 6 %
Lymphs Abs: 1.4 10*3/uL (ref 0.7–4.0)
MCH: 30.7 pg (ref 26.0–34.0)
MCHC: 33.5 g/dL (ref 30.0–36.0)
MCV: 91.6 fL (ref 80.0–100.0)
Monocytes Absolute: 1.9 10*3/uL — ABNORMAL HIGH (ref 0.1–1.0)
Monocytes Relative: 9 %
Neutro Abs: 17.5 10*3/uL — ABNORMAL HIGH (ref 1.7–7.7)
Neutrophils Relative %: 80 %
Platelet Count: 295 10*3/uL (ref 150–400)
RBC: 3.81 MIL/uL — ABNORMAL LOW (ref 4.22–5.81)
RDW: 19.7 % — ABNORMAL HIGH (ref 11.5–15.5)
WBC Count: 22.1 10*3/uL — ABNORMAL HIGH (ref 4.0–10.5)
nRBC: 0.1 % (ref 0.0–0.2)

## 2021-02-15 MED ORDER — SODIUM CHLORIDE 0.9% FLUSH
10.0000 mL | Freq: Once | INTRAVENOUS | Status: AC
  Administered 2021-02-15: 10 mL

## 2021-02-15 MED ORDER — HEPARIN SOD (PORK) LOCK FLUSH 100 UNIT/ML IV SOLN
500.0000 [IU] | Freq: Once | INTRAVENOUS | Status: AC
  Administered 2021-02-15: 500 [IU]

## 2021-02-22 ENCOUNTER — Other Ambulatory Visit: Payer: Self-pay | Admitting: Physician Assistant

## 2021-02-22 DIAGNOSIS — C3492 Malignant neoplasm of unspecified part of left bronchus or lung: Secondary | ICD-10-CM

## 2021-02-22 DIAGNOSIS — R7401 Elevation of levels of liver transaminase levels: Secondary | ICD-10-CM

## 2021-02-23 ENCOUNTER — Other Ambulatory Visit: Payer: Self-pay

## 2021-02-23 ENCOUNTER — Telehealth: Payer: Self-pay

## 2021-02-23 ENCOUNTER — Inpatient Hospital Stay: Payer: Federal, State, Local not specified - PPO | Attending: Internal Medicine

## 2021-02-23 DIAGNOSIS — C7951 Secondary malignant neoplasm of bone: Secondary | ICD-10-CM | POA: Insufficient documentation

## 2021-02-23 DIAGNOSIS — K7689 Other specified diseases of liver: Secondary | ICD-10-CM | POA: Diagnosis not present

## 2021-02-23 DIAGNOSIS — C3412 Malignant neoplasm of upper lobe, left bronchus or lung: Secondary | ICD-10-CM | POA: Insufficient documentation

## 2021-02-23 DIAGNOSIS — Z95828 Presence of other vascular implants and grafts: Secondary | ICD-10-CM

## 2021-02-23 DIAGNOSIS — C3492 Malignant neoplasm of unspecified part of left bronchus or lung: Secondary | ICD-10-CM

## 2021-02-23 DIAGNOSIS — R7401 Elevation of levels of liver transaminase levels: Secondary | ICD-10-CM

## 2021-02-23 DIAGNOSIS — Z79899 Other long term (current) drug therapy: Secondary | ICD-10-CM | POA: Diagnosis not present

## 2021-02-23 LAB — CBC WITH DIFFERENTIAL (CANCER CENTER ONLY)
Abs Immature Granulocytes: 0.39 10*3/uL — ABNORMAL HIGH (ref 0.00–0.07)
Basophils Absolute: 0 10*3/uL (ref 0.0–0.1)
Basophils Relative: 0 %
Eosinophils Absolute: 0.2 10*3/uL (ref 0.0–0.5)
Eosinophils Relative: 1 %
HCT: 36 % — ABNORMAL LOW (ref 39.0–52.0)
Hemoglobin: 11.3 g/dL — ABNORMAL LOW (ref 13.0–17.0)
Immature Granulocytes: 3 %
Lymphocytes Relative: 6 %
Lymphs Abs: 0.8 10*3/uL (ref 0.7–4.0)
MCH: 30.8 pg (ref 26.0–34.0)
MCHC: 31.4 g/dL (ref 30.0–36.0)
MCV: 98.1 fL (ref 80.0–100.0)
Monocytes Absolute: 0.7 10*3/uL (ref 0.1–1.0)
Monocytes Relative: 5 %
Neutro Abs: 11.6 10*3/uL — ABNORMAL HIGH (ref 1.7–7.7)
Neutrophils Relative %: 85 %
Platelet Count: 175 10*3/uL (ref 150–400)
RBC: 3.67 MIL/uL — ABNORMAL LOW (ref 4.22–5.81)
RDW: 17.5 % — ABNORMAL HIGH (ref 11.5–15.5)
WBC Count: 13.6 10*3/uL — ABNORMAL HIGH (ref 4.0–10.5)
nRBC: 0 % (ref 0.0–0.2)

## 2021-02-23 LAB — CMP (CANCER CENTER ONLY)
ALT: 324 U/L (ref 0–44)
AST: 150 U/L — ABNORMAL HIGH (ref 15–41)
Albumin: 2.4 g/dL — ABNORMAL LOW (ref 3.5–5.0)
Alkaline Phosphatase: 413 U/L — ABNORMAL HIGH (ref 38–126)
Anion gap: 7 (ref 5–15)
BUN: 50 mg/dL — ABNORMAL HIGH (ref 8–23)
CO2: 18 mmol/L — ABNORMAL LOW (ref 22–32)
Calcium: 8.4 mg/dL — ABNORMAL LOW (ref 8.9–10.3)
Chloride: 112 mmol/L — ABNORMAL HIGH (ref 98–111)
Creatinine: 1.93 mg/dL — ABNORMAL HIGH (ref 0.61–1.24)
GFR, Estimated: 37 mL/min — ABNORMAL LOW (ref >60)
Glucose, Bld: 177 mg/dL — ABNORMAL HIGH (ref 70–99)
Potassium: 4.9 mmol/L (ref 3.5–5.1)
Sodium: 137 mmol/L (ref 135–145)
Total Bilirubin: 3.7 mg/dL (ref 0.3–1.2)
Total Protein: 5.9 g/dL — ABNORMAL LOW (ref 6.5–8.1)

## 2021-02-23 MED ORDER — HEPARIN SOD (PORK) LOCK FLUSH 100 UNIT/ML IV SOLN
500.0000 [IU] | Freq: Once | INTRAVENOUS | Status: AC
  Administered 2021-02-23: 500 [IU]

## 2021-02-23 MED ORDER — SODIUM CHLORIDE 0.9% FLUSH
10.0000 mL | Freq: Once | INTRAVENOUS | Status: AC
  Administered 2021-02-23: 10 mL

## 2021-02-23 NOTE — Telephone Encounter (Signed)
Per Cassandra PA-C, advise pt his liver results are trending down and continue his steroids. Also pts creatinine has increased a little more, pt needs to be sure to hydrate and if he would like, we can arrange for IVF.  I have spoken with the pt and advised as indicated. Pt declined IVF at this time but agreed to call us back if he would like to move forward with it. Pt expressed understanding of this information.

## 2021-02-24 ENCOUNTER — Telehealth: Payer: Self-pay | Admitting: Medical Oncology

## 2021-02-24 NOTE — Telephone Encounter (Signed)
Lumakras -rx expired. I called back and told the Lumakras was discontinued secondary to lab abnormalities.

## 2021-02-28 NOTE — Progress Notes (Signed)
[facility] OFFICE PROGRESS NOTE  [name], [address] [address]  DIAGNOSIS:  Metastatic non-small cell lung cancer, adenocarcinoma initially diagnosed as stage IB (T2a, N0, M0) non-small cell lung cancer, adenocarcinoma diagnosed in December 2017.  The patient has evidence for disease recurrence in July 2019.  Biomarker Findings Tumor Mutational Burden - TMB-Intermediate (16 Muts/Mb) Microsatellite status - MS-Stable Genomic Findings For a complete list of the genes assayed, please refer to the Appendix. KIT amplification KRAS C94B SMAD4 splice site [id number]>M OQ94 I232F 7 Disease relevant genes with no reportable alterations: EGFR, ALK, BRAF, MET, RET, ERBB2, ROS1    PDL 1 expression is 0%  PRIOR THERAPY: 1) status post left lower lobectomy as well as wedge resection of the left upper lobe on 05/11/2016. 2) Adjuvant systemic chemotherapy with cisplatin 75 MG/M2 and Alimta 500 MG/M2 every 3 weeks. First dose 07/03/2016. Status post 4 cycles. 3) Systemic chemotherapy with carboplatin for AUC of 5, Alimta 500 mg/M2 and Keytruda 200 mg IV every 3 weeks.  Status post 47 cycles.  Starting from cycle #5 the patient will be treated with maintenance Alimta and Ketruda (pembrolizumab) every 3 weeks.  This treatment was discontinued secondary to disease progression. 4) Lumakras (Sotorasib) 960 mg p.o. daily started October 29, 2020.  Status post 2 months of treatment. Treatment was on hold for a few weeks because liver dysfunction as patient received prednisone taper. Resumed at a lower dose on 01/05/21 480 mg. On hold starting from 01/12/21 due to elevated LFTs.  Treatment was resumed again on 01/19/2021 at 240 mg p.o. daily. This was discontinued again on 01/31/21.   CURRENT THERAPY: Observation   INTERVAL HISTORY: William Kales [name] 65 y.o. male returns to the clinic today for a follow-up visit accompanied by his wife.  The patient was previously undergoing  targeted treatment with Lumakras but he had been having significant challenges secondary to elevated LFTs due to [place].  This was ultimately discontinued on 01/31/2021 as he continued to have elevated LFTs even at the lowest dose.  He completed his high-dose prednisone taper on 03/01/21. He has been feeling fatigued since coming off the steroids.   He wasn evaluated in the Er last month due to the transaminitis which did not show any visible hepatic abnormality.   Overall, the patient is feeling fine today besides the fatigue.  Denies any recent fever, chills, or night sweats.  He continued to lose weight over the last month. He has never been a "big eater" according to his wife. He has been trying to stay hydrated but is not a big water drinker as well. He is on coreg and lisinopril for BP. His BP is low today. He takes lisinopril 2x per day. He took his dose this morning. A few months ago his lisinopril were discontinue temporarily due to hypotension but has since been resumed. Denies any headaches or visual changes.  His breathing is at his baseline per patient report. He denies chest pain or hemoptysis. Denies any abdominal pain, nausea, vomiting, or constipation. He had diarrhea following the restaging CT scan yesterday but it was not as bad as usual due to drinking the watered down contrast at the radiology department. Denies any headache or visual changes.  Denies any rashes or skin changes.  The patient recently had a restaging CT scan performed.  He is here today for evaluation to review his scan results and for more detailed discussion about his current  condition and recommended treatment options.  MEDICAL HISTORY: Past Medical History:  Diagnosis Date   AAA (abdominal aortic aneurysm)    AAA (abdominal aortic aneurysm) without rupture 02/04/2014   Cancer ([facility])    lung   Encounter for antineoplastic chemotherapy 06/06/2016   History of hepatitis B    HNP (herniated nucleus pulposus), lumbar     L4 with radiculopathy   Hypertension    Lung cancer ([facility]) 05/18/2016   Malignant neoplasm of lower lobe of left lung ([facility]) 04/05/2016   Mass of left lung    Mitral insufficiency 04/06/2016   This patient will eventually need MV repair if his prognosis is good from oncology standpoint. However, his lung cancer therapy  is the priority right now. His cardiac condition won't preclude possible lung surgery.  Once his lung cancer is under control he will need a TEE to further evaluate his mitral valve anatomy and MR severity, and also have ischemic workup as tere is evidence on calcificati   Renal insufficiency 10/09/2016   S/P partial lobectomy of lung 05/11/2016   Varicose veins of legs     ALLERGIES:  is allergic to tramadol.  MEDICATIONS:  Current Outpatient Medications  Medication Sig Dispense Refill   carvedilol (COREG CR) 10 MG 24 hr capsule Take 10 mg by mouth daily.     lisinopril (ZESTRIL) 10 MG tablet Take 10 mg by mouth 2 (two) times daily.     amoxicillin-clavulanate (AUGMENTIN) 875-125 MG tablet Take 1 tablet by mouth 2 (two) times daily. (Patient not taking: Reported on 03/02/2021) 14 tablet 0   loperamide (IMODIUM A-D) 2 MG tablet Take 2 mg by mouth 4 (four) times daily as needed for diarrhea or loose stools. (Patient not taking: No sig reported)     predniSONE (DELTASONE) 10 MG tablet Take 6 tablets (60 mg) daily for 7 days, followed by 4 tablets (40 mg daily) for 7 days, followed by 2 tablets (20 mg) daily for 7 days, followed by 1 tablet (10 mg) daily for 10 days (Patient not taking: Reported on 03/02/2021) 91 tablet 0   No current facility-administered medications for this visit.    SURGICAL HISTORY:  Past Surgical History:  Procedure Laterality Date   ABDOMINAL AORTIC ENDOVASCULAR STENT GRAFT N/A 03/12/2016   Procedure: ABDOMINAL AORTIC ENDOVASCULAR STENT GRAFT;  Surgeon: Conrad Brogden, MD;  Location: [facility];  Service: Vascular;  Laterality: N/A;   COLONOSCOPY      INGUINAL HERNIA REPAIR Left 1975   Left inguinal hernia   INGUINAL HERNIA REPAIR Right    IR IMAGING GUIDED PORT INSERTION  09/04/2019   LOBECTOMY Left 05/11/2016   Procedure: LEFT LOWER LOBE LOBECTOMY AND LEFT UPPER LOBE  RESECTION AND PLACEMENT OF ON-Q;  Surgeon: [name], MD;  Location: [facility];  Service: Thoracic;  Laterality: Left;   LUMBAR LAMINECTOMY  October 22, 2012   LYMPH NODE DISSECTION Left 05/11/2016   Procedure: LYMPH NODE DISSECTION;  Surgeon: [name], MD;  Location: [facility];  Service: Thoracic;  Laterality: Left;   RIGHT/LEFT HEART CATH AND CORONARY ANGIOGRAPHY N/A 03/10/2020   Procedure: RIGHT/LEFT HEART CATH AND CORONARY ANGIOGRAPHY;  Surgeon: Sherren Mocha, MD;  Location: [facility];  Service: Cardiovascular;  Laterality: N/A;   STAPLING OF BLEBS Left 05/11/2016   Procedure: STAPLING OF APICAL BLEB;  Surgeon: [name], MD;  Location: [facility];  Service: Thoracic;  Laterality: Left;   TEE WITHOUT CARDIOVERSION N/A 10/07/2019   Procedure: TRANSESOPHAGEAL ECHOCARDIOGRAM (  TEE);  Surgeon: Dorothy Spark, MD;  Location: [facility] ENDOSCOPY;  Service: Cardiovascular;  Laterality: N/A;   VIDEO ASSISTED THORACOSCOPY Left 05/18/2016   Procedure: VIDEO ASSISTED THORACOSCOPY WITH REMOVAL OF LEFT APICAL BLEB;  Surgeon: [name], MD;  Location: [facility];  Service: Thoracic;  Laterality: Left;   VIDEO ASSISTED THORACOSCOPY (VATS)/WEDGE RESECTION Left 05/11/2016   Procedure: LEFT VIDEO ASSISTED THORACOSCOPY (VATS);  Surgeon: [name], MD;  Location: [facility];  Service: Thoracic;  Laterality: Left;   VIDEO BRONCHOSCOPY N/A 05/11/2016   Procedure: VIDEO BRONCHOSCOPY, LEFT LUNG;  Surgeon: [name], MD;  Location: [facility];  Service: Thoracic;  Laterality: N/A;   VIDEO BRONCHOSCOPY N/A 05/18/2016   Procedure: VIDEO BRONCHOSCOPY WITH BRONCHIAL WASHING;  Surgeon: [name], MD;  Location: [facility];  Service: Thoracic;  Laterality: N/A;    REVIEW OF  SYSTEMS:   Review of Systems  Constitutional: Positive for fatigue and weight loss. Negative for chills and fever.  HENT: Negative for mouth sores, nosebleeds, sore throat and trouble swallowing.   Eyes: Negative for eye problems and icterus.  Respiratory: Positive for shortness of breath with exertion. Negative for cough, hemoptysis, and wheezing.   Cardiovascular: Negative for chest pain and leg swelling.  Gastrointestinal: Positive for diarrhea (improving). Negative for abdominal pain, constipation, nausea and vomiting.  Genitourinary: Negative for bladder incontinence, difficulty urinating, dysuria, frequency and hematuria.   Musculoskeletal: Negative for back pain, gait problem, neck pain and neck stiffness.  Skin: Negative for itching and rash.  Neurological: Negative for dizziness, extremity weakness, gait problem, headaches, light-headedness and seizures.  Hematological: Negative for adenopathy. Does not bruise/bleed easily.  Psychiatric/Behavioral: Negative for confusion, depression and sleep disturbance. The patient is not nervous/anxious.     PHYSICAL EXAMINATION:  Blood pressure 96/70, pulse 79, temperature 98.3 F (36.8 C), temperature source Axillary, resp. rate 17, height _0  (1.905 m), weight 134 lb 1.6 oz (60.8 kg), SpO2 95 %.  ECOG PERFORMANCE STATUS: 1  Physical Exam  Constitutional: Oriented to person, place, and time and thin appearing male and in no distress.   HENT:  Head: Normocephalic and atraumatic.  Mouth/Throat: Oropharynx is clear and moist. No oropharyngeal exudate.  Eyes: Conjunctivae are normal. Right eye exhibits no discharge. Left eye exhibits no discharge. No scleral icterus.  Neck: Normal range of motion. Neck supple.  Cardiovascular: Cardiovascular: Systolic murmur auscultated in his posterior left thorax. Normal rate, regular rhythm, normal heart sounds and intact distal pulses.   Pulmonary/Chest: Effort normal and breath sounds normal. No  respiratory distress. No wheezes. No rales.  Abdominal: Soft. Bowel sounds are normal. Exhibits no distension and no mass. There is no tenderness.  Musculoskeletal: Normal range of motion. Exhibits no edema.  Lymphadenopathy:    No cervical adenopathy.  Neurological: Alert and oriented to person, place, and time. Exhibits normal muscle tone. Gait normal. Coordination normal.  Skin: Skin is warm and dry. No rash noted. Not diaphoretic. No erythema. No pallor.  Psychiatric: Mood, memory and judgment normal.  Vitals reviewed.  LABORATORY DATA: Lab Results  Component Value Date   WBC 9.5 03/02/2021   HGB 11.8 (L) 03/02/2021   HCT 37.5 (L) 03/02/2021   MCV 96.6 03/02/2021   PLT 222 03/02/2021      Chemistry      Component Value Date/Time   NA 136 03/02/2021 1047   NA 132 (L) 10/05/2019 0830   NA 138 01/11/2017 1343   K 4.4 03/02/2021 1047   K 4.7  01/11/2017 1343   CL 109 03/02/2021 1047   CO2 18 (L) 03/02/2021 1047   CO2 23 01/11/2017 1343   BUN 40 (H) 03/02/2021 1047   BUN 14 10/05/2019 0830   BUN 20.2 01/11/2017 1343   CREATININE 1.64 (H) 03/02/2021 1047   CREATININE 1.6 (H) 01/11/2017 1343      Component Value Date/Time   CALCIUM 8.8 (L) 03/02/2021 1047   CALCIUM 9.3 01/11/2017 1343   ALKPHOS 433 (H) 03/02/2021 1047   ALKPHOS 89 01/11/2017 1343   AST 74 (H) 03/02/2021 1047   AST 41 (H) 01/11/2017 1343   ALT 175 (H) 03/02/2021 1047   ALT 27 01/11/2017 1343   BILITOT 2.7 (H) 03/02/2021 1047   BILITOT 0.49 01/11/2017 1343       RADIOGRAPHIC STUDIES:  CT Abdomen Pelvis Wo Contrast  Result Date: 03/02/2021 CLINICAL DATA:  Metastatic non-small cell lung cancer, prior left upper lobe resection, current oral chemotherapy. EXAM: CT CHEST, ABDOMEN AND PELVIS WITHOUT CONTRAST TECHNIQUE: Multidetector CT imaging of the chest, abdomen and pelvis was performed following the standard protocol without IV contrast. COMPARISON:  Multiple exams, including 02/02/2021 FINDINGS: CT  CHEST FINDINGS Cardiovascular: Right Port-A-Cath tip: SVC. Coronary, aortic arch, and branch vessel atherosclerotic vascular disease. Stable ascending thoracic aortic aneurysm at 4.1 cm diameter. Borderline cardiomegaly. Mediastinum/Nodes: Hypodense left thyroid nodule 2.3 by 1.4 cm on image 6 series 2. This is unchanged from 02/16/2016. Recommend thyroid US (ref: J Am Coll Radiol. 2015 Feb;12(2): 143-50). Lungs/Pleura: Prior right calcified pleural plaque on the right. Prior left lower lobectomy. Stable 4 mm right upper lobe nodule on image 79 series 4. Stable 3 mm right upper lobe nodule on image 89 series 4. Stable 5 by 4 mm subpleural nodule in the right lower lobe on image 157 series 4. Centrilobular emphysema. Musculoskeletal: The patient has a known lesion of the T4 spinous process which in the past such as on 10/21/2019 appeared more sharply defined and apparent, and on today's exam manifest mainly as a very subtle ground-glass density within the spinous process with equivocal expansion, for example on image 90 series 6. The lesion was lytic and much more apparent for example on the PET-CT from 11/12/2017. CT ABDOMEN PELVIS FINDINGS Hepatobiliary: Stable small hypodense right hepatic lobe lesion on image 65 series 2. Stable biliary dilatation in the left hepatic lobe. No new liver lesions are appreciated on today's noncontrast examination. Pancreas: Unremarkable Spleen: Unremarkable Adrenals/Urinary Tract: Unremarkable Stomach/Bowel: Unremarkable. Orally administered contrast extends through to the rectum. Vascular/Lymphatic: Atherosclerosis is present, including aortoiliac atherosclerotic disease. Infrarenal abdominal aorta bi-iliac stent graft. Reproductive: Prostatomegaly. Other: No supplemental non-categorized findings. Musculoskeletal: Chronic deformity of the right greater trochanter. Spondylosis and degenerative disc disease at L4-5. IMPRESSION: 1. No findings of progressive malignancy. Several  right-sided nodules measuring 5 mm or less are unchanged. The known prior metastatic lesion at T4 demonstrates low conspicuity and no progression. 2. Stable ascending thoracic aortic aneurysm at 4.1 cm in diameter. Recommend annual imaging followup by CTA or MRA. This recommendation follows 2010 ACCF/AHA/AATS/ACR/ASA/SCA/SCAI/SIR/STS/SVM Guidelines for the Diagnosis and Management of Patients with Thoracic Aortic Disease. Circulation. 2010; 121: Z610-R604. Aortic aneurysm NOS (ICD10-I71.9) 3. Other imaging findings of potential clinical significance: Coronary atherosclerosis. Aorta bi-iliac stent graft. Stable left hepatic lobe biliary dilatation and stable small right hepatic lobe hypodense lesion which is likely benign. Prostatomegaly. Chronic deformity of the right greater trochanter. Aortic Atherosclerosis (ICD10-I70.0) and Emphysema (ICD10-J43.9). Electronically Signed   By: Van Clines M.D.   On: 03/02/2021  08:46   CT ABDOMEN PELVIS WO CONTRAST  Result Date: 02/02/2021 CLINICAL DATA:  Metastatic non-small cell lung cancer EXAM: CT CHEST, ABDOMEN AND PELVIS WITHOUT CONTRAST TECHNIQUE: Multidetector CT imaging of the chest, abdomen and pelvis was performed following the standard protocol without IV contrast. COMPARISON:  CT chest abdomen and pelvis dated December 28, 2020 FINDINGS: CT CHEST FINDINGS Cardiovascular: Cardiomegaly. No pericardial effusion. Atherosclerotic disease of the thoracic aorta. Unchanged dilation of the ascending thoracic aorta, measuring up to 4.1 cm. Mediastinum/Nodes: Small hiatal hernia and patulous esophagus. Thyroid is unremarkable. No enlarged lymph nodes seen in the chest. Lungs/Pleura: Calcified right pleural plaque, likely due to prior hemothorax or empyema. Central airways are patent. Prior lower lobe left lobectomy and left upper lobe wedge resection. Bilateral bronchial wall thickening with areas of mucous plugging. Severe upper lobe predominant centrilobular  emphysema. Stable small solid pulmonary nodules. Reference solid nodule of the right upper lobe measuring 4 mm on series 6, image 75. Musculoskeletal: Unchanged mildly expansile sclerotic lesion of the T4 spinous process. No chest wall mass or suspicious bone lesions identified. CT ABDOMEN PELVIS FINDINGS Hepatobiliary: Small low-attenuation lesion of the right hepatic lobe measuring 5 mm on series 2, image 68, unchanged compared to prior. Gallbladder is unremarkable. Similar focal biliary ductal dilation of the left hepatic lobe. Pancreas: Unremarkable. No pancreatic ductal dilatation or surrounding inflammatory changes. Spleen: Normal in size without focal abnormality. Adrenals/Urinary Tract: Adrenal glands are unremarkable. Kidneys are normal, without renal calculi, focal lesion, or hydronephrosis. Bladder is unremarkable. Stomach/Bowel: Stomach is within normal limits. Appendix appears normal. No evidence of bowel wall thickening, distention, or inflammatory changes. Vascular/Lymphatic: Prior repair of abdominal aortic aneurysm with aorta bi-iliac endovascular stent. No pathologically enlarged lymph nodes are seen in the abdomen or pelvis. Reproductive: Mild prostatomegaly. Other: No abdominal wall hernia or abnormality. No abdominopelvic ascites. Musculoskeletal: No suspicious bone lesions identified. IMPRESSION: Stable small solid pulmonary nodules. No evidence of new metastatic disease in the chest abdomen or pelvis on noncontrast exam Unchanged mildly expansile sclerotic lesion of the T4 spinous process. No new bony lesions. Unchanged dilation of the ascending thoracic aorta, measuring up to 4.1 cm. Recommend annual imaging followup by CTA or MRA. This recommendation follows 2010 ACCF/AHA/AATS/ACR/ASA/SCA/SCAI/SIR/STS/SVM Guidelines for the Diagnosis and Management of Patients with Thoracic Aortic Disease. Circulation. 2010; 121: P546-F681. Aortic aneurysm NOS (ICD10-I71.9) Aortic Atherosclerosis  (ICD10-I70.0) and Emphysema (ICD10-J43.9). Electronically Signed   By: Yetta Glassman M.D.   On: 02/02/2021 12:50   CT Chest Wo Contrast  Result Date: 03/02/2021 CLINICAL DATA:  Metastatic non-small cell lung cancer, prior left upper lobe resection, current oral chemotherapy. EXAM: CT CHEST, ABDOMEN AND PELVIS WITHOUT CONTRAST TECHNIQUE: Multidetector CT imaging of the chest, abdomen and pelvis was performed following the standard protocol without IV contrast. COMPARISON:  Multiple exams, including 02/02/2021 FINDINGS: CT CHEST FINDINGS Cardiovascular: Right Port-A-Cath tip: SVC. Coronary, aortic arch, and branch vessel atherosclerotic vascular disease. Stable ascending thoracic aortic aneurysm at 4.1 cm diameter. Borderline cardiomegaly. Mediastinum/Nodes: Hypodense left thyroid nodule 2.3 by 1.4 cm on image 6 series 2. This is unchanged from 02/16/2016. Recommend thyroid US (ref: J Am Coll Radiol. 2015 Feb;12(2): 143-50). Lungs/Pleura: Prior right calcified pleural plaque on the right. Prior left lower lobectomy. Stable 4 mm right upper lobe nodule on image 79 series 4. Stable 3 mm right upper lobe nodule on image 89 series 4. Stable 5 by 4 mm subpleural nodule in the right lower lobe on image 157 series 4. Centrilobular emphysema. Musculoskeletal: The  patient has a known lesion of the T4 spinous process which in the past such as on 10/21/2019 appeared more sharply defined and apparent, and on today's exam manifest mainly as a very subtle ground-glass density within the spinous process with equivocal expansion, for example on image 90 series 6. The lesion was lytic and much more apparent for example on the PET-CT from 11/12/2017. CT ABDOMEN PELVIS FINDINGS Hepatobiliary: Stable small hypodense right hepatic lobe lesion on image 65 series 2. Stable biliary dilatation in the left hepatic lobe. No new liver lesions are appreciated on today's noncontrast examination. Pancreas: Unremarkable Spleen: Unremarkable  Adrenals/Urinary Tract: Unremarkable Stomach/Bowel: Unremarkable. Orally administered contrast extends through to the rectum. Vascular/Lymphatic: Atherosclerosis is present, including aortoiliac atherosclerotic disease. Infrarenal abdominal aorta bi-iliac stent graft. Reproductive: Prostatomegaly. Other: No supplemental non-categorized findings. Musculoskeletal: Chronic deformity of the right greater trochanter. Spondylosis and degenerative disc disease at L4-5. IMPRESSION: 1. No findings of progressive malignancy. Several right-sided nodules measuring 5 mm or less are unchanged. The known prior metastatic lesion at T4 demonstrates low conspicuity and no progression. 2. Stable ascending thoracic aortic aneurysm at 4.1 cm in diameter. Recommend annual imaging followup by CTA or MRA. This recommendation follows 2010 ACCF/AHA/AATS/ACR/ASA/SCA/SCAI/SIR/STS/SVM Guidelines for the Diagnosis and Management of Patients with Thoracic Aortic Disease. Circulation. 2010; 121: F573-U202. Aortic aneurysm NOS (ICD10-I71.9) 3. Other imaging findings of potential clinical significance: Coronary atherosclerosis. Aorta bi-iliac stent graft. Stable left hepatic lobe biliary dilatation and stable small right hepatic lobe hypodense lesion which is likely benign. Prostatomegaly. Chronic deformity of the right greater trochanter. Aortic Atherosclerosis (ICD10-I70.0) and Emphysema (ICD10-J43.9). Electronically Signed   By: Van Clines M.D.   On: 03/02/2021 08:46   CT Chest Wo Contrast  Result Date: 02/02/2021 CLINICAL DATA:  Metastatic non-small cell lung cancer EXAM: CT CHEST, ABDOMEN AND PELVIS WITHOUT CONTRAST TECHNIQUE: Multidetector CT imaging of the chest, abdomen and pelvis was performed following the standard protocol without IV contrast. COMPARISON:  CT chest abdomen and pelvis dated December 28, 2020 FINDINGS: CT CHEST FINDINGS Cardiovascular: Cardiomegaly. No pericardial effusion. Atherosclerotic disease of the  thoracic aorta. Unchanged dilation of the ascending thoracic aorta, measuring up to 4.1 cm. Mediastinum/Nodes: Small hiatal hernia and patulous esophagus. Thyroid is unremarkable. No enlarged lymph nodes seen in the chest. Lungs/Pleura: Calcified right pleural plaque, likely due to prior hemothorax or empyema. Central airways are patent. Prior lower lobe left lobectomy and left upper lobe wedge resection. Bilateral bronchial wall thickening with areas of mucous plugging. Severe upper lobe predominant centrilobular emphysema. Stable small solid pulmonary nodules. Reference solid nodule of the right upper lobe measuring 4 mm on series 6, image 75. Musculoskeletal: Unchanged mildly expansile sclerotic lesion of the T4 spinous process. No chest wall mass or suspicious bone lesions identified. CT ABDOMEN PELVIS FINDINGS Hepatobiliary: Small low-attenuation lesion of the right hepatic lobe measuring 5 mm on series 2, image 68, unchanged compared to prior. Gallbladder is unremarkable. Similar focal biliary ductal dilation of the left hepatic lobe. Pancreas: Unremarkable. No pancreatic ductal dilatation or surrounding inflammatory changes. Spleen: Normal in size without focal abnormality. Adrenals/Urinary Tract: Adrenal glands are unremarkable. Kidneys are normal, without renal calculi, focal lesion, or hydronephrosis. Bladder is unremarkable. Stomach/Bowel: Stomach is within normal limits. Appendix appears normal. No evidence of bowel wall thickening, distention, or inflammatory changes. Vascular/Lymphatic: Prior repair of abdominal aortic aneurysm with aorta bi-iliac endovascular stent. No pathologically enlarged lymph nodes are seen in the abdomen or pelvis. Reproductive: Mild prostatomegaly. Other: No abdominal wall hernia or abnormality.  No abdominopelvic ascites. Musculoskeletal: No suspicious bone lesions identified. IMPRESSION: Stable small solid pulmonary nodules. No evidence of new metastatic disease in the chest  abdomen or pelvis on noncontrast exam Unchanged mildly expansile sclerotic lesion of the T4 spinous process. No new bony lesions. Unchanged dilation of the ascending thoracic aorta, measuring up to 4.1 cm. Recommend annual imaging followup by CTA or MRA. This recommendation follows 2010 ACCF/AHA/AATS/ACR/ASA/SCA/SCAI/SIR/STS/SVM Guidelines for the Diagnosis and Management of Patients with Thoracic Aortic Disease. Circulation. 2010; 121: [id number]. Aortic aneurysm NOS (ICD10-I71.9) Aortic Atherosclerosis (ICD10-I70.0) and Emphysema (ICD10-J43.9). Electronically Signed   By: Yetta Glassman M.D.   On: 02/02/2021 12:50     ASSESSMENT/PLAN:  This is a very pleasant 65 year old African American male with metastatic disease who was initially diagnosed with stage IB non-small cell lung cancer, adenocarcinoma of the left upper lobe. Molecular studies showed KRAS G12C mutation which can be targetted in the second line setting. PD-L1 expression was negative. First diagnosed in December 2017.   He was status post a wedge resection of the left upper lobe followed by 4 cycles of adjuvant systemic chemotherapy with cisplatin and Alimta. He tolerated treatment well except for fatigue. He had been on observation until June of 2018. 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  He then was undergoing systemic chemotherapy with carboplatin, alimta, and keytruda. He is status post 47 cycles. Starting from cycle #5, he had been on maintenance Alimta and Keytruda every 3 weeks. His Alimta had been held during a few cycles of treatment due to renal insufficiency. It was ultimately discontinued due to renal insufficiency. Beryle Flock was eventually discontinued due to disease progression.    He then started on Targeted treatment with Lumakras 960 mg p.o. daily. His first dose was on 10/29/20.  He is status post 2 months of treatment but his treatment had been on hold a few weeks because of liver dysfunction.  Treatment was resumed on 01/05/2021 at a lower dose 480 milligrams p.o. daily.  This was on hold, again, after 1 week due to evidence of recurrent liver dysfunction.  This was resumed again on 01/19/2021 at a lower dose at 240 mg p.o. daily.  This was permanently discontinued on 01/30/2021 for significant elevated LFTs.   The patient recently had a restaging CT scan performed.  Dr. Julien Nordmann personally and independently reviewed the scan discussed results with the patient today.  Scan shows no evidence for disease progression.  Dr. Julien Nordmann gave the patient the option of continuing on observation with close monitoring to allow time to recover.  The patient is interested in proceeding on observation.   We will arrange for a restaging CT scan of the chest, abdomen, and pelvis in 3 months. He prefers to drink the watered down contrast at the radiology department. I will order the scan with contrast at this time; however, I made a note to radiology that it is ok to hold contrast if his creatinine is >1.4 the day of his scan.   We will see him back for a follow up visit it at that time. However, we will recheck his labs again in 2 weeks to monitor his LFTs.   We will make flush appointments every 6-8 weeks.   Regarding his blood pressure, he has the capacity to monitor his blood pressure at home. Advised to check his blood pressure before  taking his anti-hypertensive. Discussed if his systolic BP is <741, to hold his dose of lisinopril. Advised to keep a log of his readings and to follow up with is PCP for hypertension medication adjustment if needed if his BP continues to be low. Encouraged to continue to increase his hydration. He was offered IVF over the last few weeks which he has declined.   The patient was advised to call immediately if he has any concerning symptoms in the interval. The  patient voices understanding of current disease status and treatment options and is in agreement with the current care plan. All questions were answered. The patient knows to call the clinic with any problems, questions or concerns. We can certainly see the patient much sooner if necessary        No orders of the defined types were placed in this encounter.     William Rapaport L Fayez Sturgell, PA-C 03/02/21  ADDENDUM: Hematology/Oncology Attending: I had a face-to-face encounter with the patient.  I reviewed his record, lab, scan and recommended his care plan.  This is a very pleasant 65 years old African-American male with recurrent non-small cell lung cancer, adenocarcinoma status post induction systemic chemotherapy with carboplatin, Alimta and Keytruda followed by maintenance treatment initially with Alimta and Keytruda and then single agent Keytruda for a total treatment of 47 cycles discontinued secondary to disease progression.  The patient started second line treatment with Lumakras (Sotorasib) initially at 960 mg p.o. daily for around 2 months but this was discontinued secondary to worsening renal function.  The patient was treated with high-dose prednisone that was tapered slowly until improvement of his liver enzymes. He resumed his treatment at a lower dose of 240 but he has significant elevation of his liver enzymes again within few days from resuming the treatment.  The patient has been on a tapered dose of prednisone for the last several weeks and there is improvement but not complete normalization of his liver enzymes. He had repeat CT scan of the chest, abdomen and pelvis performed recently.  I personally and independently reviewed the scans and discussed the results with the patient and his wife. Has a scan showed no concerning findings for disease progression. I recommended for the patient to continue on observation for now with repeat CT scan of the chest in 3 months. Regarding  the liver dysfunction.,  I will continue to monitor his comprehensive metabolic panel closely and he will have repeat comprehensive metabolic panel in 2 weeks. The patient was advised to call immediately if he has any other concerning symptoms in the interval. The total time spent in the appointment was 35 minutes. Disclaimer: This note was dictated with voice recognition software. Similar sounding words can inadvertently be transcribed and may be missed upon review. Eilleen Kempf, MD 03/03/21

## 2021-03-01 ENCOUNTER — Ambulatory Visit (HOSPITAL_COMMUNITY)
Admission: RE | Admit: 2021-03-01 | Discharge: 2021-03-01 | Disposition: A | Discharge status: Home / Self Care | Payer: Federal, State, Local not specified - PPO | Source: Ambulatory Visit | Attending: Physician Assistant | Admitting: Physician Assistant

## 2021-03-01 ENCOUNTER — Ambulatory Visit (HOSPITAL_COMMUNITY): Payer: Federal, State, Local not specified - PPO

## 2021-03-01 ENCOUNTER — Other Ambulatory Visit: Payer: Self-pay

## 2021-03-01 ENCOUNTER — Encounter: Payer: Self-pay | Admitting: Internal Medicine

## 2021-03-01 DIAGNOSIS — I712 Thoracic aortic aneurysm, without rupture, unspecified: Secondary | ICD-10-CM | POA: Diagnosis not present

## 2021-03-01 DIAGNOSIS — C3492 Malignant neoplasm of unspecified part of left bronchus or lung: Secondary | ICD-10-CM | POA: Insufficient documentation

## 2021-03-01 DIAGNOSIS — I7121 Aneurysm of the ascending aorta, without rupture: Secondary | ICD-10-CM | POA: Diagnosis not present

## 2021-03-01 DIAGNOSIS — J439 Emphysema, unspecified: Secondary | ICD-10-CM | POA: Diagnosis not present

## 2021-03-01 DIAGNOSIS — C3490 Malignant neoplasm of unspecified part of unspecified bronchus or lung: Secondary | ICD-10-CM | POA: Diagnosis not present

## 2021-03-01 DIAGNOSIS — J069 Acute upper respiratory infection, unspecified: Secondary | ICD-10-CM | POA: Insufficient documentation

## 2021-03-01 DIAGNOSIS — R911 Solitary pulmonary nodule: Secondary | ICD-10-CM | POA: Diagnosis not present

## 2021-03-01 DIAGNOSIS — N400 Enlarged prostate without lower urinary tract symptoms: Secondary | ICD-10-CM | POA: Diagnosis not present

## 2021-03-02 ENCOUNTER — Inpatient Hospital Stay: Payer: Federal, State, Local not specified - PPO

## 2021-03-02 ENCOUNTER — Inpatient Hospital Stay (HOSPITAL_BASED_OUTPATIENT_CLINIC_OR_DEPARTMENT_OTHER): Payer: Federal, State, Local not specified - PPO | Admitting: Physician Assistant

## 2021-03-02 VITALS — BP 96/70 | HR 79 | Temp 98.3°F | Resp 17 | Ht 75.0 in | Wt 134.1 lb

## 2021-03-02 DIAGNOSIS — C3492 Malignant neoplasm of unspecified part of left bronchus or lung: Secondary | ICD-10-CM

## 2021-03-02 DIAGNOSIS — K7689 Other specified diseases of liver: Secondary | ICD-10-CM | POA: Diagnosis not present

## 2021-03-02 DIAGNOSIS — C3432 Malignant neoplasm of lower lobe, left bronchus or lung: Secondary | ICD-10-CM

## 2021-03-02 DIAGNOSIS — Z79899 Other long term (current) drug therapy: Secondary | ICD-10-CM | POA: Diagnosis not present

## 2021-03-02 DIAGNOSIS — C7951 Secondary malignant neoplasm of bone: Secondary | ICD-10-CM | POA: Diagnosis not present

## 2021-03-02 DIAGNOSIS — Z95828 Presence of other vascular implants and grafts: Secondary | ICD-10-CM

## 2021-03-02 DIAGNOSIS — C3412 Malignant neoplasm of upper lobe, left bronchus or lung: Secondary | ICD-10-CM | POA: Diagnosis not present

## 2021-03-02 DIAGNOSIS — R7401 Elevation of levels of liver transaminase levels: Secondary | ICD-10-CM

## 2021-03-02 LAB — CMP (CANCER CENTER ONLY)
ALT: 175 U/L — ABNORMAL HIGH (ref 0–44)
AST: 74 U/L — ABNORMAL HIGH (ref 15–41)
Albumin: 2.6 g/dL — ABNORMAL LOW (ref 3.5–5.0)
Alkaline Phosphatase: 433 U/L — ABNORMAL HIGH (ref 38–126)
Anion gap: 9 (ref 5–15)
BUN: 40 mg/dL — ABNORMAL HIGH (ref 8–23)
CO2: 18 mmol/L — ABNORMAL LOW (ref 22–32)
Calcium: 8.8 mg/dL — ABNORMAL LOW (ref 8.9–10.3)
Chloride: 109 mmol/L (ref 98–111)
Creatinine: 1.64 mg/dL — ABNORMAL HIGH (ref 0.61–1.24)
GFR, Estimated: 46 mL/min — ABNORMAL LOW (ref >60)
Glucose, Bld: 92 mg/dL (ref 70–99)
Potassium: 4.4 mmol/L (ref 3.5–5.1)
Sodium: 136 mmol/L (ref 135–145)
Total Bilirubin: 2.7 mg/dL — ABNORMAL HIGH (ref 0.3–1.2)
Total Protein: 6.4 g/dL — ABNORMAL LOW (ref 6.5–8.1)

## 2021-03-02 LAB — CBC WITH DIFFERENTIAL (CANCER CENTER ONLY)
Abs Immature Granulocytes: 0.24 10*3/uL — ABNORMAL HIGH (ref 0.00–0.07)
Basophils Absolute: 0 10*3/uL (ref 0.0–0.1)
Basophils Relative: 0 %
Eosinophils Absolute: 0.2 10*3/uL (ref 0.0–0.5)
Eosinophils Relative: 2 %
HCT: 37.5 % — ABNORMAL LOW (ref 39.0–52.0)
Hemoglobin: 11.8 g/dL — ABNORMAL LOW (ref 13.0–17.0)
Immature Granulocytes: 3 %
Lymphocytes Relative: 13 %
Lymphs Abs: 1.2 10*3/uL (ref 0.7–4.0)
MCH: 30.4 pg (ref 26.0–34.0)
MCHC: 31.5 g/dL (ref 30.0–36.0)
MCV: 96.6 fL (ref 80.0–100.0)
Monocytes Absolute: 0.7 10*3/uL (ref 0.1–1.0)
Monocytes Relative: 8 %
Neutro Abs: 7.1 10*3/uL (ref 1.7–7.7)
Neutrophils Relative %: 74 %
Platelet Count: 222 10*3/uL (ref 150–400)
RBC: 3.88 MIL/uL — ABNORMAL LOW (ref 4.22–5.81)
RDW: 15.7 % — ABNORMAL HIGH (ref 11.5–15.5)
WBC Count: 9.5 10*3/uL (ref 4.0–10.5)
nRBC: 0 % (ref 0.0–0.2)

## 2021-03-02 LAB — TSH: TSH: 2.224 u[IU]/mL (ref 0.320–4.118)

## 2021-03-02 MED ORDER — HEPARIN SOD (PORK) LOCK FLUSH 100 UNIT/ML IV SOLN
500.0000 [IU] | Freq: Once | INTRAVENOUS | Status: AC
Start: 2021-03-02 — End: 2021-03-02
  Administered 2021-03-02: 500 [IU]

## 2021-03-02 MED ORDER — SODIUM CHLORIDE 0.9% FLUSH
10.0000 mL | Freq: Once | INTRAVENOUS | Status: AC
  Administered 2021-03-02: 10 mL

## 2021-03-03 ENCOUNTER — Encounter: Payer: Self-pay | Admitting: Internal Medicine

## 2021-03-06 ENCOUNTER — Encounter: Payer: Self-pay | Admitting: Internal Medicine

## 2021-03-10 ENCOUNTER — Telehealth: Payer: Self-pay | Admitting: Internal Medicine

## 2021-03-10 NOTE — Telephone Encounter (Signed)
Sch per 11/9 los, pt aware

## 2021-03-15 ENCOUNTER — Encounter: Payer: Self-pay | Admitting: Internal Medicine

## 2021-03-17 ENCOUNTER — Other Ambulatory Visit: Payer: Self-pay

## 2021-03-17 ENCOUNTER — Inpatient Hospital Stay: Payer: Federal, State, Local not specified - PPO

## 2021-03-17 DIAGNOSIS — K7689 Other specified diseases of liver: Secondary | ICD-10-CM | POA: Diagnosis not present

## 2021-03-17 DIAGNOSIS — Z95828 Presence of other vascular implants and grafts: Secondary | ICD-10-CM

## 2021-03-17 DIAGNOSIS — R7401 Elevation of levels of liver transaminase levels: Secondary | ICD-10-CM

## 2021-03-17 DIAGNOSIS — C3412 Malignant neoplasm of upper lobe, left bronchus or lung: Secondary | ICD-10-CM | POA: Diagnosis not present

## 2021-03-17 DIAGNOSIS — Z79899 Other long term (current) drug therapy: Secondary | ICD-10-CM | POA: Diagnosis not present

## 2021-03-17 DIAGNOSIS — C3432 Malignant neoplasm of lower lobe, left bronchus or lung: Secondary | ICD-10-CM

## 2021-03-17 DIAGNOSIS — C3492 Malignant neoplasm of unspecified part of left bronchus or lung: Secondary | ICD-10-CM

## 2021-03-17 DIAGNOSIS — C7951 Secondary malignant neoplasm of bone: Secondary | ICD-10-CM | POA: Diagnosis not present

## 2021-03-17 LAB — CBC WITH DIFFERENTIAL (CANCER CENTER ONLY)
Abs Immature Granulocytes: 0.11 10*3/uL — ABNORMAL HIGH (ref 0.00–0.07)
Basophils Absolute: 0 10*3/uL (ref 0.0–0.1)
Basophils Relative: 0 %
Eosinophils Absolute: 0.1 10*3/uL (ref 0.0–0.5)
Eosinophils Relative: 1 %
HCT: 38.6 % — ABNORMAL LOW (ref 39.0–52.0)
Hemoglobin: 12.7 g/dL — ABNORMAL LOW (ref 13.0–17.0)
Immature Granulocytes: 1 %
Lymphocytes Relative: 17 %
Lymphs Abs: 1.6 10*3/uL (ref 0.7–4.0)
MCH: 30.5 pg (ref 26.0–34.0)
MCHC: 32.9 g/dL (ref 30.0–36.0)
MCV: 92.6 fL (ref 80.0–100.0)
Monocytes Absolute: 1.1 10*3/uL — ABNORMAL HIGH (ref 0.1–1.0)
Monocytes Relative: 12 %
Neutro Abs: 6.4 10*3/uL (ref 1.7–7.7)
Neutrophils Relative %: 69 %
Platelet Count: 378 10*3/uL (ref 150–400)
RBC: 4.17 MIL/uL — ABNORMAL LOW (ref 4.22–5.81)
RDW: 15.4 % (ref 11.5–15.5)
WBC Count: 9.4 10*3/uL (ref 4.0–10.5)
nRBC: 0 % (ref 0.0–0.2)

## 2021-03-17 LAB — CMP (CANCER CENTER ONLY)
ALT: 35 U/L (ref 0–44)
AST: 41 U/L (ref 15–41)
Albumin: 2.5 g/dL — ABNORMAL LOW (ref 3.5–5.0)
Alkaline Phosphatase: 349 U/L — ABNORMAL HIGH (ref 38–126)
Anion gap: 8 (ref 5–15)
BUN: 16 mg/dL (ref 8–23)
CO2: 21 mmol/L — ABNORMAL LOW (ref 22–32)
Calcium: 8.9 mg/dL (ref 8.9–10.3)
Chloride: 111 mmol/L (ref 98–111)
Creatinine: 1.38 mg/dL — ABNORMAL HIGH (ref 0.61–1.24)
GFR, Estimated: 56 mL/min — ABNORMAL LOW (ref >60)
Glucose, Bld: 91 mg/dL (ref 70–99)
Potassium: 4.1 mmol/L (ref 3.5–5.1)
Sodium: 140 mmol/L (ref 135–145)
Total Bilirubin: 1.6 mg/dL — ABNORMAL HIGH (ref 0.3–1.2)
Total Protein: 6.4 g/dL — ABNORMAL LOW (ref 6.5–8.1)

## 2021-03-17 LAB — TSH: TSH: 1.263 u[IU]/mL (ref 0.320–4.118)

## 2021-03-17 MED ORDER — SODIUM CHLORIDE 0.9% FLUSH
10.0000 mL | Freq: Once | INTRAVENOUS | Status: AC
  Administered 2021-03-17: 10 mL

## 2021-03-17 MED ORDER — HEPARIN SOD (PORK) LOCK FLUSH 100 UNIT/ML IV SOLN
500.0000 [IU] | Freq: Once | INTRAVENOUS | Status: AC
  Administered 2021-03-17: 500 [IU]

## 2021-03-18 NOTE — Progress Notes (Deleted)
Cardiology Office Note:    Date:  03/18/2021   ID:  William Kales [name], DOB [birth year], MRN [id number]  PCP:  [name], MD   [facility] Providers Cardiologist:  Sherren Mocha, MD {   Referring MD: [name], MD    History of Present Illness:    [name]. is a 65 y.o. male with a hx of AAA s/p stent graft in 02/2016, history of tobacco abuse, severe mitral regurgitation, and metastatic adenocarcinoma of the lung s/p left lung wedge resection and left lower lobectomy, chemo (cisplatin, pembrolizumab, and alimta) with recurrence who returns to clinic for follow-up of his severe MR.  Per review of the record, the patient was undergoing work-up for mitra-clip for severe MR which was postponed due to recurrence of lung carcinoma. After discussion between Dr. Meda Coffee and Dr. Earlie Server, given that his disease was responding to chemo, he was cleared for Mitra-clip. Underwent TEE which confirmed severe MR. LHC with low LVEDP, normal filling pressures and nonobstructive CAD. Was recommended for continued surveillance per Dr. Burt Knack.   Last saw [name] on 10/04/20 where his Alimta was stopped due to worsening renal function. No HF symptoms but was not very active at that time walking <1/4 mile at that time. TTE 12/29/20 with myxomatous MV, moderate-to-severe MR with anterior leaflet prolapse. Normal LA size and E wave <1.2, RVSP 67mmHg.  Today,  Past Medical History:  Diagnosis Date   AAA (abdominal aortic aneurysm)    AAA (abdominal aortic aneurysm) without rupture 02/04/2014   Cancer ([place])    lung   Encounter for antineoplastic chemotherapy 06/06/2016   History of hepatitis B    HNP (herniated nucleus pulposus), lumbar    L4 with radiculopathy   Hypertension    Lung cancer ([place]) 05/18/2016   Malignant neoplasm of lower lobe of left lung ([place]) 04/05/2016   Mass of left lung    Mitral insufficiency 04/06/2016   This patient will eventually need MV repair if his  prognosis is good from oncology standpoint. However, his lung cancer therapy  is the priority right now. His cardiac condition won't preclude possible lung surgery.  Once his lung cancer is under control he will need a TEE to further evaluate his mitral valve anatomy and MR severity, and also have ischemic workup as tere is evidence on calcificati   Renal insufficiency 10/09/2016   S/P partial lobectomy of lung 05/11/2016   Varicose veins of legs     Past Surgical History:  Procedure Laterality Date   ABDOMINAL AORTIC ENDOVASCULAR STENT GRAFT N/A 03/12/2016   Procedure: ABDOMINAL AORTIC ENDOVASCULAR STENT GRAFT;  Surgeon: Conrad Yadkin, MD;  Location: [place];  Service: Vascular;  Laterality: N/A;   COLONOSCOPY     INGUINAL HERNIA REPAIR Left 1975   Left inguinal hernia   INGUINAL HERNIA REPAIR Right    IR IMAGING GUIDED PORT INSERTION  09/04/2019   LOBECTOMY Left 05/11/2016   Procedure: LEFT LOWER LOBE LOBECTOMY AND LEFT UPPER LOBE  RESECTION AND PLACEMENT OF ON-Q;  Surgeon: [name], MD;  Location: [place];  Service: Thoracic;  Laterality: Left;   LUMBAR LAMINECTOMY  October 22, 2012   LYMPH NODE DISSECTION Left 05/11/2016   Procedure: LYMPH NODE DISSECTION;  Surgeon: [name], MD;  Location: [place];  Service: Thoracic;  Laterality: Left;   RIGHT/LEFT HEART CATH AND CORONARY ANGIOGRAPHY N/A 03/10/2020   Procedure: RIGHT/LEFT HEART CATH AND CORONARY ANGIOGRAPHY;  Surgeon: Sherren Mocha, MD;  Location:  [place] INVASIVE CV LAB;  Service: Cardiovascular;  Laterality: N/A;   STAPLING OF BLEBS Left 05/11/2016   Procedure: STAPLING OF APICAL BLEB;  Surgeon: [name], MD;  Location: [place];  Service: Thoracic;  Laterality: Left;   TEE WITHOUT CARDIOVERSION N/A 10/07/2019   Procedure: TRANSESOPHAGEAL ECHOCARDIOGRAM (TEE);  Surgeon: Dorothy Spark, MD;  Location: [facility] ENDOSCOPY;  Service: Cardiovascular;  Laterality: N/A;   VIDEO ASSISTED THORACOSCOPY Left 05/18/2016   Procedure: VIDEO  ASSISTED THORACOSCOPY WITH REMOVAL OF LEFT APICAL BLEB;  Surgeon: [name], MD;  Location: [place];  Service: Thoracic;  Laterality: Left;   VIDEO ASSISTED THORACOSCOPY (VATS)/WEDGE RESECTION Left 05/11/2016   Procedure: LEFT VIDEO ASSISTED THORACOSCOPY (VATS);  Surgeon: [name], MD;  Location: [place];  Service: Thoracic;  Laterality: Left;   VIDEO BRONCHOSCOPY N/A 05/11/2016   Procedure: VIDEO BRONCHOSCOPY, LEFT LUNG;  Surgeon: [name], MD;  Location: [place];  Service: Thoracic;  Laterality: N/A;   VIDEO BRONCHOSCOPY N/A 05/18/2016   Procedure: VIDEO BRONCHOSCOPY WITH BRONCHIAL WASHING;  Surgeon: [name], MD;  Location: [facility] OR;  Service: Thoracic;  Laterality: N/A;    Current Medications: No outpatient medications have been marked as taking for the 03/28/21 encounter (Appointment) with Freada Bergeron, MD.     Allergies:   Tramadol   Social History   Socioeconomic History   Marital status: Married    Spouse name: Not on file   Number of children: Not on file   Years of education: Not on file   Highest education level: Not on file  Occupational History   Not on file  Tobacco Use   Smoking status: Former    Packs/day: 1.00    Types: Cigarettes    Quit date: 03/05/2016    Years since quitting: 5.0   Smokeless tobacco: Never  Vaping Use   Vaping Use: Never used  Substance and Sexual Activity   Alcohol use: Yes    Alcohol/week: 6.0 standard drinks    Types: 6 Cans of beer per week   Drug use: Yes    Types: Cocaine, Heroin    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  ROS:   Please see the history of present illness.    *** All other systems reviewed and are negative.  EKGs/Labs/Other Studies Reviewed:    The following studies were reviewed today: TTE 01/21/21: IMPRESSIONS     1. Left ventricular ejection fraction, by estimation, is 60 to 65%. The  left ventricle has normal function. The left ventricle has no regional  wall motion abnormalities. There is severe asymmetric left ventricular  hypertrophy of the basal-septal  segment. Left ventricular diastolic parameters are indeterminate.   2. Right ventricular systolic function is normal. The right ventricular  size is normal. There is moderately elevated pulmonary artery systolic  pressure. The estimated right ventricular systolic pressure is 72.5 mmHg.   3. The aortic valve is normal in structure. Aortic valve regurgitation is  trivial. No aortic stenosis is  present.   4. Aortic dilatation noted. There is dilatation of the aortic root,  measuring 45 mm. There is dilatation of the ascending aorta, measuring 42  mm.   5. The mitral valve is myxomatous. Moderate to severe mitral valve  regurgitation. Significant thickening and prolapse of anterior leaflet  causing eccentric posterior directed MR jet. Difficult to quantify MR  given eccentric jet, but no LA/LV dilatation  and E-wave velocity less than 1.2 m/s argues against severe MR. Would  consider TEE or cardiac MRI to quantify MR severity   TTE: 08/18/2019    1. Left ventricular ejection fraction, by estimation, is 60 to 65%. The  left ventricle has normal function. The left ventricle has no regional  wall motion abnormalities. There is mild left ventricular hypertrophy.  Left ventricular diastolic parameters  are consistent with Grade I diastolic dysfunction (impaired relaxation).   2. Right ventricular systolic function is normal. The right ventricular  size is normal. There is severely elevated  pulmonary artery systolic  pressure. The estimated right ventricular systolic pressure is 55.7 mmHg.   3. Left atrial size was moderately dilated.   4. The mitral valve is myxomatous. Severe mitral valve regurgitation.   5. The aortic valve is tricuspid. Aortic valve regurgitation is not  visualized.   6. Aortic dilatation noted. Aneurysm of the ascending aorta, measuring 40  mm. There is moderate to severe dilatation at the level of the sinuses of  Valsalva measuring 49 mm.    TEE 10/07/2019   1. There is 4+ primary mitral regurgitation with myxomatous anterior  mitral valve leaflet, with prolapse of all three scallops. The jet is  posteriorly directed, eccentric with reversal of pulmonary vein forward  flow bilateraly. PISA radius 1.0 cm, ERO  0.45 cm2, regurgitation volume 58 ml. Posterior leaflet measures 1.0 cm.  Flail gap 0.3 cm.   2. Left ventricular ejection fraction, by estimation, is 60 to 65%. The  left ventricle has normal function. The left ventricle has no regional  wall motion abnormalities.   3. Right ventricular systolic function is normal. The right ventricular  size is normal. There is normal pulmonary artery systolic pressure.   4. Left atrial size was moderately dilated. No left atrial/left atrial  appendage thrombus was detected. The LAA emptying velocity was 40 cm/s.   5. Right atrial size was mildly dilated.   6. The mitral valve is myxomatous. No evidence of mitral valve  regurgitation. No evidence of mitral stenosis.   7. Tricuspid valve regurgitation is moderate.   8. The aortic valve is normal in structure. Aortic valve regurgitation is  trivial. No aortic stenosis is present.   9. There is moderate dilatation at the level of the sinuses of Valsalva  measuring 48 mm. There is mild (Grade II) plaque.   LHC 03/10/20: 1. Patent coronary arteries with nonobstructive CAD, primarily affecting the mid-RCA. Widely patent LAD and LCx 2. Normal right heart  pressures, including normal PCWP 3. Normal LVEDP   Recommend: further review with multidisciplinary heart team, considering transcatheter edge to edge mitral valve repair versus ongoing observation/med rx  EKG:  EKG is *** ordered today.  The ekg ordered today demonstrates ***  Recent Labs: 03/17/2021: ALT 35; BUN 16; Creatinine 1.38; Hemoglobin 12.7; Platelet Count 378; Potassium 4.1; Sodium 140; TSH 1.263  Recent Lipid Panel No results found for: CHOL, TRIG, HDL, CHOLHDL, VLDL, LDLCALC, LDLDIRECT   Risk Assessment/Calculations:   {Does this patient have ATRIAL FIBRILLATION?:[phone]}  Physical Exam:    VS:  There were no vitals taken for this visit.    Wt Readings from Last 3 Encounters:  03/02/21 134 lb 1.6 oz (60.8 kg)  02/02/21 137 lb 1.6 oz (62.2 kg)  01/19/21 144 lb 14.4 oz (65.7 kg)     GEN: *** Well nourished, well developed in no acute distress HEENT: Normal NECK: No JVD; No carotid bruits LYMPHATICS: No lymphadenopathy CARDIAC: ***RRR, no murmurs, rubs, gallops RESPIRATORY:  Clear to auscultation without rales, wheezing or rhonchi  ABDOMEN: Soft, non-tender, non-distended MUSCULOSKELETAL:  No edema; No deformity  SKIN: Warm and dry NEUROLOGIC:  Alert and oriented x 3 PSYCHIATRIC:  Normal affect   ASSESSMENT:    No diagnosis found. 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 LHC/RHC 02/2020 with normal filling pressures, normal LVEDP. Given stable symptoms, decision was made to continue surveillance imaging.   #Metastatic Adenocarcinoma of the Lung: Followed by Dr. Earlie Server. Responding well to chemo. -Management per onc  #AAA: Previously followed by Dr. Servando Snare. Last CTA 04/2020 with stable diameter at 77mm. -Continue coreg -Start crestor 10mg  daily  #HTN:  -Continue coreg 10mg  daily (???) -Continue lisinopril 10mg  daily      {Are you ordering a  CV Procedure (e.g. stress test, cath, DCCV, TEE, etc)?   Press F2        :[id number]}    Medication Adjustments/Labs and Tests Ordered: Current medicines are reviewed at length with the patient today.  Concerns regarding medicines are outlined above.  No orders of the defined types were placed in this encounter.  No orders of the defined types were placed in this encounter.   There are no Patient Instructions on file for this visit.   Signed, Freada Bergeron, MD  03/18/2021 10:17 AM    [place]

## 2021-03-20 ENCOUNTER — Telehealth: Payer: Self-pay | Admitting: Physician Assistant

## 2021-03-20 NOTE — Telephone Encounter (Signed)
I tried to call the patient regarding his labs. His home number kept ringing and his cell phone does not have a voicemail set up. I was calling him to let him know I reviewed his labs with Dr. Julien Nordmann. His AST/AST are back down to normal. His bili is still a little high but is improved compared to prior as well as his Alk phos. With that said, I was letting him know that we no longer needs to arrange for frequent lab appointments. We will draw his labs again before his next scan in 3 months. Of course, if new or worsening symptoms in the interval, then we can always see him sooner.

## 2021-03-21 ENCOUNTER — Other Ambulatory Visit: Payer: Self-pay | Admitting: Physician Assistant

## 2021-03-23 NOTE — Telephone Encounter (Signed)
Pt called back and has been advised as indicated. Pt expressed understanding of this information.

## 2021-03-28 ENCOUNTER — Ambulatory Visit: Payer: Medicare Other | Admitting: Cardiology

## 2021-04-20 ENCOUNTER — Inpatient Hospital Stay: Payer: Federal, State, Local not specified - PPO | Attending: Internal Medicine

## 2021-04-20 ENCOUNTER — Other Ambulatory Visit: Payer: Self-pay

## 2021-04-20 DIAGNOSIS — C3412 Malignant neoplasm of upper lobe, left bronchus or lung: Secondary | ICD-10-CM | POA: Insufficient documentation

## 2021-04-20 DIAGNOSIS — Z95828 Presence of other vascular implants and grafts: Secondary | ICD-10-CM

## 2021-04-20 DIAGNOSIS — Z452 Encounter for adjustment and management of vascular access device: Secondary | ICD-10-CM | POA: Insufficient documentation

## 2021-04-20 MED ORDER — HEPARIN SOD (PORK) LOCK FLUSH 100 UNIT/ML IV SOLN
500.0000 [IU] | Freq: Once | INTRAVENOUS | Status: AC
  Administered 2021-04-20: 11:00:00 500 [IU]

## 2021-04-20 MED ORDER — SODIUM CHLORIDE 0.9% FLUSH
10.0000 mL | Freq: Once | INTRAVENOUS | Status: AC
  Administered 2021-04-20: 11:00:00 10 mL

## 2021-05-24 ENCOUNTER — Inpatient Hospital Stay: Payer: Federal, State, Local not specified - PPO | Attending: Internal Medicine

## 2021-05-24 ENCOUNTER — Other Ambulatory Visit: Payer: Self-pay

## 2021-05-24 DIAGNOSIS — Z95828 Presence of other vascular implants and grafts: Secondary | ICD-10-CM

## 2021-05-24 DIAGNOSIS — C3492 Malignant neoplasm of unspecified part of left bronchus or lung: Secondary | ICD-10-CM

## 2021-05-24 DIAGNOSIS — C3412 Malignant neoplasm of upper lobe, left bronchus or lung: Secondary | ICD-10-CM | POA: Insufficient documentation

## 2021-05-24 DIAGNOSIS — Z452 Encounter for adjustment and management of vascular access device: Secondary | ICD-10-CM | POA: Insufficient documentation

## 2021-05-24 DIAGNOSIS — Z79899 Other long term (current) drug therapy: Secondary | ICD-10-CM | POA: Insufficient documentation

## 2021-05-24 DIAGNOSIS — C3432 Malignant neoplasm of lower lobe, left bronchus or lung: Secondary | ICD-10-CM

## 2021-05-24 LAB — CBC WITH DIFFERENTIAL (CANCER CENTER ONLY)
Abs Immature Granulocytes: 0.03 10*3/uL (ref 0.00–0.07)
Basophils Absolute: 0.1 10*3/uL (ref 0.0–0.1)
Basophils Relative: 1 %
Eosinophils Absolute: 0.6 10*3/uL — ABNORMAL HIGH (ref 0.0–0.5)
Eosinophils Relative: 6 %
HCT: 51 % (ref 39.0–52.0)
Hemoglobin: 16.4 g/dL (ref 13.0–17.0)
Immature Granulocytes: 0 %
Lymphocytes Relative: 17 %
Lymphs Abs: 1.6 10*3/uL (ref 0.7–4.0)
MCH: 29.8 pg (ref 26.0–34.0)
MCHC: 32.2 g/dL (ref 30.0–36.0)
MCV: 92.7 fL (ref 80.0–100.0)
Monocytes Absolute: 1 10*3/uL (ref 0.1–1.0)
Monocytes Relative: 10 %
Neutro Abs: 6.3 10*3/uL (ref 1.7–7.7)
Neutrophils Relative %: 66 %
Platelet Count: 248 10*3/uL (ref 150–400)
RBC: 5.5 MIL/uL (ref 4.22–5.81)
RDW: 14.3 % (ref 11.5–15.5)
WBC Count: 9.6 10*3/uL (ref 4.0–10.5)
nRBC: 0 % (ref 0.0–0.2)

## 2021-05-24 LAB — CMP (CANCER CENTER ONLY)
ALT: 16 U/L (ref 0–44)
AST: 27 U/L (ref 15–41)
Albumin: 3.6 g/dL (ref 3.5–5.0)
Alkaline Phosphatase: 112 U/L (ref 38–126)
Anion gap: 5 (ref 5–15)
BUN: 20 mg/dL (ref 8–23)
CO2: 24 mmol/L (ref 22–32)
Calcium: 9.5 mg/dL (ref 8.9–10.3)
Chloride: 109 mmol/L (ref 98–111)
Creatinine: 1.6 mg/dL — ABNORMAL HIGH (ref 0.61–1.24)
GFR, Estimated: 47 mL/min — ABNORMAL LOW (ref >60)
Glucose, Bld: 93 mg/dL (ref 70–99)
Potassium: 3.5 mmol/L (ref 3.5–5.1)
Sodium: 138 mmol/L (ref 135–145)
Total Bilirubin: 0.5 mg/dL (ref 0.3–1.2)
Total Protein: 6.9 g/dL (ref 6.5–8.1)

## 2021-05-24 LAB — TSH: TSH: 2.237 u[IU]/mL (ref 0.320–4.118)

## 2021-05-24 MED ORDER — SODIUM CHLORIDE 0.9% FLUSH
10.0000 mL | Freq: Once | INTRAVENOUS | Status: AC
  Administered 2021-05-24: 10 mL

## 2021-05-24 MED ORDER — HEPARIN SOD (PORK) LOCK FLUSH 100 UNIT/ML IV SOLN
500.0000 [IU] | Freq: Once | INTRAVENOUS | Status: AC
  Administered 2021-05-24: 500 [IU]

## 2021-05-25 ENCOUNTER — Ambulatory Visit (HOSPITAL_COMMUNITY): Admission: RE | Admit: 2021-05-25 | Payer: Medicare Other | Source: Ambulatory Visit

## 2021-05-29 ENCOUNTER — Other Ambulatory Visit: Payer: Self-pay

## 2021-05-29 ENCOUNTER — Ambulatory Visit (HOSPITAL_COMMUNITY)
Admission: RE | Admit: 2021-05-29 | Discharge: 2021-05-29 | Disposition: A | Discharge status: Home / Self Care | Payer: Federal, State, Local not specified - PPO | Source: Ambulatory Visit | Attending: Internal Medicine | Admitting: Internal Medicine

## 2021-05-29 DIAGNOSIS — K7689 Other specified diseases of liver: Secondary | ICD-10-CM | POA: Diagnosis not present

## 2021-05-29 DIAGNOSIS — C801 Malignant (primary) neoplasm, unspecified: Secondary | ICD-10-CM | POA: Diagnosis not present

## 2021-05-29 DIAGNOSIS — R918 Other nonspecific abnormal finding of lung field: Secondary | ICD-10-CM | POA: Diagnosis not present

## 2021-05-29 DIAGNOSIS — J432 Centrilobular emphysema: Secondary | ICD-10-CM | POA: Diagnosis not present

## 2021-05-29 DIAGNOSIS — C3492 Malignant neoplasm of unspecified part of left bronchus or lung: Secondary | ICD-10-CM | POA: Insufficient documentation

## 2021-05-29 DIAGNOSIS — C7800 Secondary malignant neoplasm of unspecified lung: Secondary | ICD-10-CM | POA: Diagnosis not present

## 2021-05-29 MED ORDER — IOHEXOL 9 MG/ML PO SOLN
ORAL | Status: AC
  Filled 2021-05-29: qty 1000

## 2021-05-29 MED ORDER — IOHEXOL 9 MG/ML PO SOLN
1000.0000 mL | ORAL | Status: AC
  Administered 2021-05-29: 1000 mL via ORAL

## 2021-06-06 NOTE — Progress Notes (Deleted)
Cardiology Office Note:    Date:  06/06/2021   ID:  William Kales [name], DOB Aug 14, 1955, MRN [id number]  PCP:  [name], MD   [facility] Providers Cardiologist:  Sherren Mocha, MD {   Referring MD: [name], MD     History of Present Illness:    William Reinig. is a 66 y.o. male with a hx of AAA status post stent graft in 2017, severe MR, metastatic adenocarcinoma of the lung on chemotherapy who was previously followed by Dr. Meda Coffee who now returns to clinic for follow-up.  Patient states that he was first noted to have a heart murmur on physical exam when he was being worked up for primary lung cancer in November 2017 after CT imaging revealed suspicious mass in the left lung following stent graft repair of abdominal aortic aneurysm.  Echocardiogram performed at that time revealed normal left ventricular function with prolapse involving the anterior leaflet of the mitral valve and what was felt to be moderate mitral regurgitation.  The patient underwent uncomplicated left lower lobectomy with wedge resection of the left upper lobe by Dr. 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 The patient has been followed closely ever since by Dr. Earlie Server.  Follow-up CT and PET imaging performed in 2019 revealed disease recurrence with bilateral lung nodules and hypermetabolic destructive lesion in the T4 vertebral body consistent with bony involvement.  The patient has remained on long-term chemotherapy ever since (carboplatin for AUC of 5, Alimta 500 mg/M2 and Keytruda 200 mg IV) and follow-up CT imaging has revealed no progression of disease. He then developed progressive exertional shortness of breath. Follow-up transthoracic echocardiogram 07/2019 revealed normal left ventricular function with severe mitral regurgitation and signs of elevated right heart pressures. TEE October 07, 2019 confirmed the presence of severe mitral  regurgitation.  The jet of regurgitation was posterior with overriding prolapse of the anterior leaflet.  There was reversal of flow in the pulmonary veins with PISA radius measured 1.0 cm, ERO 0.45 cm, and regurgitant volume estimated 58 mL. He underwent cardiac surgical evaluation with Dr. Roxy Manns 01/07/2020 and then referred to Dr. Burt Knack for consideration of transcatheter edge-to-edge mitral valve repair as a less invasive treatment option in the context of his lung cancer. He underwent RHC/LHC that showed low LVEDP, nonobstructive CAD and normal right-sided pressures.  Because patient symptoms improved in the meantime it was decided that we will continue surveillance of his symptoms and follow-up echocardiograms.  Last saw [name] in 09/2020 where he was overall doing okay. Was not very active at the time. Repeat TTE 12/2020 with LVEF 60-65%, severe basal septal hypertrophy, normal RV, moderate PHTN with PASP 44mmHg, trivial AI, ascending aorta 7mm, moderate-to-severe MR.  Today, ***  Past Medical History:  Diagnosis Date   AAA (abdominal aortic aneurysm)    AAA (abdominal aortic aneurysm) without rupture 02/04/2014   Cancer ([place])    lung   Encounter for antineoplastic chemotherapy 06/06/2016   History of hepatitis B    HNP (herniated nucleus pulposus), lumbar    L4 with radiculopathy   Hypertension    Lung cancer ([place]) 05/18/2016   Malignant neoplasm of lower lobe of left lung ([place]) 04/05/2016   Mass of left lung    Mitral insufficiency 04/06/2016   This patient will eventually need MV repair if his prognosis is good from oncology standpoint. However, his lung cancer therapy  is the priority right now.  His cardiac condition won't preclude possible lung surgery.  Once his lung cancer is under control he will need a TEE to further evaluate his mitral valve anatomy and MR severity, and also have ischemic workup as tere is evidence on calcificati   Renal insufficiency 10/09/2016   S/P  partial lobectomy of lung 05/11/2016   Varicose veins of legs     Past Surgical History:  Procedure Laterality Date   ABDOMINAL AORTIC ENDOVASCULAR STENT GRAFT N/A 03/12/2016   Procedure: ABDOMINAL AORTIC ENDOVASCULAR STENT GRAFT;  Surgeon: Conrad [place] Globe, MD;  Location: [place];  Service: Vascular;  Laterality: N/A;   COLONOSCOPY     INGUINAL HERNIA REPAIR Left 1975   Left inguinal hernia   INGUINAL HERNIA REPAIR Right    IR IMAGING GUIDED PORT INSERTION  09/04/2019   LOBECTOMY Left 05/11/2016   Procedure: LEFT LOWER LOBE LOBECTOMY AND LEFT UPPER LOBE  RESECTION AND PLACEMENT OF ON-Q;  Surgeon: [name], MD;  Location: [place];  Service: Thoracic;  Laterality: Left;   LUMBAR LAMINECTOMY  October 22, 2012   LYMPH NODE DISSECTION Left 05/11/2016   Procedure: LYMPH NODE DISSECTION;  Surgeon: [name], MD;  Location: [place];  Service: Thoracic;  Laterality: Left;   RIGHT/LEFT HEART CATH AND CORONARY ANGIOGRAPHY N/A 03/10/2020   Procedure: RIGHT/LEFT HEART CATH AND CORONARY ANGIOGRAPHY;  Surgeon: Sherren Mocha, MD;  Location: [place] [facility];  Service: Cardiovascular;  Laterality: N/A;   STAPLING OF BLEBS Left 05/11/2016   Procedure: STAPLING OF APICAL BLEB;  Surgeon: [name], MD;  Location: [place];  Service: Thoracic;  Laterality: Left;   TEE WITHOUT CARDIOVERSION N/A 10/07/2019   Procedure: TRANSESOPHAGEAL ECHOCARDIOGRAM (TEE);  Surgeon: Dorothy Spark, MD;  Location: [facility];  Service: Cardiovascular;  Laterality: N/A;   VIDEO ASSISTED THORACOSCOPY Left 05/18/2016   Procedure: VIDEO ASSISTED THORACOSCOPY WITH REMOVAL OF LEFT APICAL BLEB;  Surgeon: [name], MD;  Location: [place];  Service: Thoracic;  Laterality: Left;   VIDEO ASSISTED THORACOSCOPY (VATS)/WEDGE RESECTION Left 05/11/2016   Procedure: LEFT VIDEO ASSISTED THORACOSCOPY (VATS);  Surgeon: [name], MD;  Location: [place];  Service: Thoracic;  Laterality: Left;   VIDEO BRONCHOSCOPY N/A  05/11/2016   Procedure: VIDEO BRONCHOSCOPY, LEFT LUNG;  Surgeon: [name], MD;  Location: [place];  Service: Thoracic;  Laterality: N/A;   VIDEO BRONCHOSCOPY N/A 05/18/2016   Procedure: VIDEO BRONCHOSCOPY WITH BRONCHIAL WASHING;  Surgeon: [name], MD;  Location: [place];  Service: Thoracic;  Laterality: N/A;    Current Medications: No outpatient medications have been marked as taking for the 06/07/21 encounter (Appointment) with Freada Bergeron, MD.     Allergies:   Tramadol   Social History   Socioeconomic History   Marital status: Married    Spouse name: Not on file   Number of children: Not on file   Years of education: Not on file   Highest education level: Not on file  Occupational History   Not on file  Tobacco Use   Smoking status: Former    Packs/day: 1.00    Types: Cigarettes    Quit date: 03/05/2016    Years since quitting: 5.2   Smokeless tobacco: Never  Vaping Use   Vaping Use: Never used  Substance and Sexual Activity   Alcohol use: Yes    Alcohol/week: 6.0 standard drinks    Types: 6 Cans of beer per week   Drug use: Yes  Types: Cocaine, Heroin    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  ROS:   Please see the history of present illness.    *** All other systems reviewed and are negative.  EKGs/Labs/Other Studies Reviewed:    The following studies were reviewed today: TTE: 08/18/2019    1. Left ventricular ejection fraction, by estimation, is 60 to 65%. The  left ventricle has normal function. The left ventricle has  no regional  wall motion abnormalities. There is mild left ventricular hypertrophy.  Left ventricular diastolic parameters  are consistent with Grade I diastolic dysfunction (impaired relaxation).   2. Right ventricular systolic function is normal. The right ventricular  size is normal. There is severely elevated pulmonary artery systolic  pressure. The estimated right ventricular systolic pressure is 62.3 mmHg.   3. Left atrial size was moderately dilated.   4. The mitral valve is myxomatous. Severe mitral valve regurgitation.   5. The aortic valve is tricuspid. Aortic valve regurgitation is not  visualized.   6. Aortic dilatation noted. Aneurysm of the ascending aorta, measuring 40  mm. There is moderate to severe dilatation at the level of the sinuses of  Valsalva measuring 49 mm.    TEE 10/07/2019   1. There is 4+ primary mitral regurgitation with myxomatous anterior  mitral valve leaflet, with prolapse of all three scallops. The jet is  posteriorly directed, eccentric with reversal of pulmonary vein forward  flow bilateraly. PISA radius 1.0 cm, ERO  0.45 cm2, regurgitation volume 58 ml. Posterior leaflet measures 1.0 cm.  Flail gap 0.3 cm.   2. Left ventricular ejection fraction, by estimation, is 60 to 65%. The  left ventricle has normal function. The left ventricle has no regional  wall motion abnormalities.   3. Right ventricular systolic function is normal. The right ventricular  size is normal. There is normal pulmonary artery systolic pressure.   4. Left atrial size was moderately dilated. No left atrial/left atrial  appendage thrombus was detected. The LAA emptying velocity was 40 cm/s.   5. Right atrial size was mildly dilated.   6. The mitral valve is myxomatous. No evidence of mitral valve  regurgitation. No evidence of mitral stenosis.   7. Tricuspid valve regurgitation is moderate.   8. The aortic valve is normal in structure. Aortic valve regurgitation is  trivial.  No aortic stenosis is present.   9. There is moderate dilatation at the level of the sinuses of Valsalva  measuring 48 mm. There is mild (Grade II) plaque.     TEE 10/07/2019   1. There is 4+ primary mitral regurgitation with myxomatous anterior  mitral valve leaflet, with prolapse of all three scallops. The jet is  posteriorly directed, eccentric with reversal of pulmonary vein forward  flow bilateraly. PISA radius 1.0 cm, ERO  0.45 cm2, regurgitation volume 58 ml. Posterior leaflet measures 1.0 cm.  Flail gap 0.3 cm.   2. Left ventricular ejection fraction, by estimation, is 60 to 65%. The  left ventricle has normal  function. The left ventricle has no regional  wall motion abnormalities.   3. Right ventricular systolic function is normal. The right ventricular  size is normal. There is normal pulmonary artery systolic pressure.   4. Left atrial size was moderately dilated. No left atrial/left atrial  appendage thrombus was detected. The LAA emptying velocity was 40 cm/s.   5. Right atrial size was mildly dilated.   6. The mitral valve is myxomatous. No evidence of mitral valve  regurgitation. No evidence of mitral stenosis.   7. Tricuspid valve regurgitation is moderate.   8. The aortic valve is normal in structure. Aortic valve regurgitation is  trivial. No aortic stenosis is present.   9. There is moderate dilatation at the level of the sinuses of Valsalva  measuring 48 mm. There is mild (Grade II) plaque.   EKG:  EKG is *** ordered today.  The ekg ordered today demonstrates ***  Recent Labs: 05/24/2021: ALT 16; BUN 20; Creatinine 1.60; Hemoglobin 16.4; Platelet Count 248; Potassium 3.5; Sodium 138; TSH 2.237  Recent Lipid Panel No results found for: CHOL, TRIG, HDL, CHOLHDL, VLDL, LDLCALC, LDLDIRECT   Risk Assessment/Calculations:   {Does this patient have ATRIAL FIBRILLATION?:[phone]}       Physical Exam:    VS:  There were no vitals taken for this visit.    Wt  Readings from Last 3 Encounters:  03/02/21 134 lb 1.6 oz (60.8 kg)  02/02/21 137 lb 1.6 oz (62.2 kg)  01/19/21 144 lb 14.4 oz (65.7 kg)     GEN: *** Well nourished, well developed in no acute distress HEENT: Normal NECK: No JVD; No carotid bruits LYMPHATICS: No lymphadenopathy CARDIAC: ***RRR, no murmurs, rubs, gallops RESPIRATORY:  Clear to auscultation without rales, wheezing or rhonchi  ABDOMEN: Soft, non-tender, non-distended MUSCULOSKELETAL:  No edema; No deformity  SKIN: Warm and dry NEUROLOGIC:  Alert and oriented x 3 PSYCHIATRIC:  Normal affect   ASSESSMENT:    No diagnosis found. PLAN:    In order of problems listed above:  #Mitral Valve Prolapse with Severe MR: TEE 06/21 anterior MV prolapse with severe MR with PISA radius 1.0cm, EROA 0.45, regurgitant volume estimated 58 mL. He underwent cardiac surgical evaluation with Dr. Roxy Manns 01/07/2020 and then referred to Dr. Burt Knack for consideration of transcatheter edge-to-edge mitral valve repair as a less invasive treatment option in the context of his lung cancer. He underwent RHC/LHC that showed low LVEDP, nonobstructive CAD and normal right-sided pressures.  Because his symptoms had improved and significant comorbidites, the decision was made to continue serial TTEs for monitoring. Last TTE 12/2020 with EF 60-65%, moderate-to-severe MR. E wave <1.2 and no significant LA/LV dilation so may be more moderate. Currently *** -Continue surveillance TTEs every 6 months -Continue lisinopril 10mg  daily -Continue coreg 10mg  daily -Not requiring diuretic  #Metastatic Lung Cancer: S/p wedge resection with 4 cycles of adjuvant systemic chemotherapy with cisplatin and Alimta. Had recurrence in 2019 and initiated on carboplatin, alimta, and [place]. He is status post 47 cycles. He had been on maintenance Alimta and Keytruda every 3 weeks. His Alimta was discontinued to to renal insufficiency and Beryle Flock was stopped due to disease  progression. He is now being monitored with serial CT imaging.  #HTN: -Continue coreg 10mg  daily -Continue lisinopril 10mg  dialy  #Ascending aortic aneurysm: TTE with aortic root 37mm and ascending aorta 61mm. -Serial monitoring -Manage HTN as above     {Are you ordering a CV Procedure (e.g. stress test, cath, DCCV, TEE, etc)?  Press F2        :[id number]}    Medication Adjustments/Labs and Tests Ordered: Current medicines are reviewed at length with the patient today.  Concerns regarding medicines are outlined above.  No orders of the defined types were placed in this encounter.  No orders of the defined types were placed in this encounter.   There are no Patient Instructions on file for this visit.   Signed, Freada Bergeron, MD  06/06/2021 1:52 PM    [place]

## 2021-06-07 ENCOUNTER — Encounter: Payer: Self-pay | Admitting: Cardiology

## 2021-06-07 ENCOUNTER — Other Ambulatory Visit: Payer: Self-pay

## 2021-06-07 ENCOUNTER — Ambulatory Visit (INDEPENDENT_AMBULATORY_CARE_PROVIDER_SITE_OTHER): Payer: Federal, State, Local not specified - PPO | Admitting: Cardiology

## 2021-06-07 VITALS — BP 144/90 | HR 87 | Ht 75.0 in | Wt 152.8 lb

## 2021-06-07 DIAGNOSIS — I340 Nonrheumatic mitral (valve) insufficiency: Secondary | ICD-10-CM

## 2021-06-07 DIAGNOSIS — Z79899 Other long term (current) drug therapy: Secondary | ICD-10-CM | POA: Diagnosis not present

## 2021-06-07 DIAGNOSIS — C3432 Malignant neoplasm of lower lobe, left bronchus or lung: Secondary | ICD-10-CM

## 2021-06-07 DIAGNOSIS — I341 Nonrheumatic mitral (valve) prolapse: Secondary | ICD-10-CM

## 2021-06-07 DIAGNOSIS — I7121 Aneurysm of the ascending aorta, without rupture: Secondary | ICD-10-CM

## 2021-06-07 DIAGNOSIS — I10 Essential (primary) hypertension: Secondary | ICD-10-CM | POA: Diagnosis not present

## 2021-06-07 DIAGNOSIS — C3492 Malignant neoplasm of unspecified part of left bronchus or lung: Secondary | ICD-10-CM

## 2021-06-07 MED ORDER — LISINOPRIL 10 MG PO TABS: 10.0000 mg | ORAL_TABLET | Freq: Every day | ORAL | 2 refills | Status: DC

## 2021-06-07 MED ORDER — FUROSEMIDE 20 MG PO TABS: ORAL_TABLET | ORAL | 3 refills | Status: DC

## 2021-06-07 NOTE — Patient Instructions (Signed)
Medication Instructions:   START TAKING LISINOPRIL 10 MG BY MOUTH DAILY  START TAKING LASIX 20 MG BY MOUTH 3 TIMES WEEKLY ONLY AS NEEDED FOR LOWER EXTREMITY SWELLING  *If you need a refill on your cardiac medications before your next appointment, please call your pharmacy*   Lab Work:  [place] OFFICE--BMET  If you have labs (blood work) drawn today and your tests are completely normal, you will receive your results only by: [place] (if you have MyChart) OR A paper copy in the mail If you have any lab test that is abnormal or we need to change your treatment, we will call you to review the results.   Testing/Procedures:  Your physician has requested that you have an echocardiogram. Echocardiography is a painless test that uses sound waves to create images of your heart. It provides your doctor with information about the size and shape of your heart and how well your hearts chambers and valves are working. This procedure takes approximately one hour. There are no restrictions for this procedure. SCHEDULE ECHO TO BE DONE IN MARCH 2023 PER DR. Johney Frame   Follow-Up: At [facility], you and your health needs are our priority.  As part of our continuing mission to provide you with exceptional heart care, we have created designated Provider Care Teams.  These Care Teams include your primary Cardiologist (physician) and Advanced Practice Providers (APPs -  Physician Assistants and Nurse Practitioners) who all work together to provide you with the care you need, when you need it.  We recommend signing up for the patient portal called "MyChart".  Sign up information is provided on this After Visit Summary.  MyChart is used to connect with patients for Virtual Visits (Telemedicine).  Patients are able to view lab/test results, encounter notes, upcoming appointments, etc.  Non-urgent messages can be sent to your provider as well.   To learn more about what you can do with  MyChart, go to [email or web address].    Your next appointment:   6 month(s)  The format for your next appointment:   In Person  Provider:   DR. Johney Frame

## 2021-06-07 NOTE — Progress Notes (Signed)
Cardiology Office Note:    Date:  06/07/2021   ID:  William Kales [name], DOB 06-Mar-1956, MRN [id number]  PCP:  [name], MD   [facility] Providers Cardiologist:  Sherren Mocha, MD {   Referring MD: [name], MD    History of Present Illness:    Gillermo Poch. is a 66 y.o. male with a hx of AAA status post stent graft in 2017, severe MR, metastatic adenocarcinoma of the lung on chemotherapy who was previously followed by Dr. Meda Coffee who now returns to clinic for follow-up.  Patient states that he was first noted to have a heart murmur on physical exam when he was being worked up for primary lung cancer in November 2017 after CT imaging revealed suspicious mass in the left lung following stent graft repair of abdominal aortic aneurysm.  Echocardiogram performed at that time revealed normal left ventricular function with prolapse involving the anterior leaflet of the mitral valve and what was felt to be moderate mitral regurgitation.  The patient underwent uncomplicated left lower lobectomy with wedge resection of the left upper lobe by Dr. 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 The patient has been followed closely ever since by Dr. Earlie Server.  Follow-up CT and PET imaging performed in 2019 revealed disease recurrence with bilateral lung nodules and hypermetabolic destructive lesion in the T4 vertebral body consistent with bony involvement.  The patient has remained on long-term chemotherapy ever since (carboplatin for AUC of 5, Alimta 500 mg/M2 and Keytruda 200 mg IV) and follow-up CT imaging has revealed no progression of disease. He then developed progressive exertional shortness of breath. Follow-up transthoracic echocardiogram 07/2019 revealed normal left ventricular function with severe mitral regurgitation and signs of elevated right heart pressures. TEE October 07, 2019 confirmed the presence of severe mitral  regurgitation.  The jet of regurgitation was posterior with overriding prolapse of the anterior leaflet.  There was reversal of flow in the pulmonary veins with PISA radius measured 1.0 cm, ERO 0.45 cm, and regurgitant volume estimated 58 mL. He underwent cardiac surgical evaluation with Dr. Roxy Manns 01/07/2020 and then referred to Dr. Burt Knack for consideration of transcatheter edge-to-edge mitral valve repair as a less invasive treatment option in the context of his lung cancer. He underwent RHC/LHC that showed low LVEDP, nonobstructive CAD and normal right-sided pressures.  Because patient symptoms improved in the meantime it was decided that we will continue surveillance of his symptoms and follow-up echocardiograms.  Last saw [name] in 09/2020 where he was overall doing okay. Was not very active at the time. Repeat TTE 12/2020 with LVEF 60-65%, severe basal septal hypertrophy, normal RV, moderate PHTN with PASP 33mmHg, trivial AI, ascending aorta 25mm, moderate-to-severe MR.  Today, the patient states that he is feeling pretty good. He states his SOB is overall improving.Currently he is not taking any medication since November 2022. His blood pressure was consistently normal so he discontinued his antihypertensives. He monitors his blood pressure at home occasionally.  Last week he had a reading of 469 systolic.  Every now and then he notices swelling in his bilateral ankles. He states they are currently swollen.  Lately he believes he is not eating as well as he should. He denies weight loss.  He denies any palpitations, or chest pain. No lightheadedness, headaches, syncope, orthopnea, or PND.  Of note, his chemotherapy was stopped this past November. His most recent CT was a few days  ago, results pending.   Past Medical History:  Diagnosis Date   AAA (abdominal aortic aneurysm)    AAA (abdominal aortic aneurysm) without rupture 02/04/2014   Cancer ([place])    lung   Encounter for  antineoplastic chemotherapy 06/06/2016   History of hepatitis B    HNP (herniated nucleus pulposus), lumbar    L4 with radiculopathy   Hypertension    Lung cancer ([place]) 05/18/2016   Malignant neoplasm of lower lobe of left lung ([place]) 04/05/2016   Mass of left lung    Mitral insufficiency 04/06/2016   This patient will eventually need MV repair if his prognosis is good from oncology standpoint. However, his lung cancer therapy  is the priority right now. His cardiac condition won't preclude possible lung surgery.  Once his lung cancer is under control he will need a TEE to further evaluate his mitral valve anatomy and MR severity, and also have ischemic workup as tere is evidence on calcificati   Renal insufficiency 10/09/2016   S/P partial lobectomy of lung 05/11/2016   Varicose veins of legs     Past Surgical History:  Procedure Laterality Date   ABDOMINAL AORTIC ENDOVASCULAR STENT GRAFT N/A 03/12/2016   Procedure: ABDOMINAL AORTIC ENDOVASCULAR STENT GRAFT;  Surgeon: Conrad Springboro, MD;  Location: [facility];  Service: Vascular;  Laterality: N/A;   COLONOSCOPY     INGUINAL HERNIA REPAIR Left 1975   Left inguinal hernia   INGUINAL HERNIA REPAIR Right    IR IMAGING GUIDED PORT INSERTION  09/04/2019   LOBECTOMY Left 05/11/2016   Procedure: LEFT LOWER LOBE LOBECTOMY AND LEFT UPPER LOBE  RESECTION AND PLACEMENT OF ON-Q;  Surgeon: [name], MD;  Location: [place];  Service: Thoracic;  Laterality: Left;   LUMBAR LAMINECTOMY  October 22, 2012   LYMPH NODE DISSECTION Left 05/11/2016   Procedure: LYMPH NODE DISSECTION;  Surgeon: [name], MD;  Location: [place];  Service: Thoracic;  Laterality: Left;   RIGHT/LEFT HEART CATH AND CORONARY ANGIOGRAPHY N/A 03/10/2020   Procedure: RIGHT/LEFT HEART CATH AND CORONARY ANGIOGRAPHY;  Surgeon: Sherren Mocha, MD;  Location: [place] [facility];  Service: Cardiovascular;  Laterality: N/A;   STAPLING OF BLEBS Left 05/11/2016   Procedure: STAPLING OF APICAL  BLEB;  Surgeon: [name], MD;  Location: [place];  Service: Thoracic;  Laterality: Left;   TEE WITHOUT CARDIOVERSION N/A 10/07/2019   Procedure: TRANSESOPHAGEAL ECHOCARDIOGRAM (TEE);  Surgeon: Dorothy Spark, MD;  Location: [facility] END[facility]COPY;  Service: Cardiovascular;  Laterality: N/A;   VIDEO ASSISTED THORACOSCOPY Left 05/18/2016   Procedure: VIDEO ASSISTED THORACOSCOPY WITH REMOVAL OF LEFT APICAL BLEB;  Surgeon: [name], MD;  Location: [place];  Service: Thoracic;  Laterality: Left;   VIDEO ASSISTED THORACOSCOPY (VATS)/WEDGE RESECTION Left 05/11/2016   Procedure: LEFT VIDEO ASSISTED THORACOSCOPY (VATS);  Surgeon: [name], MD;  Location: [place];  Service: Thoracic;  Laterality: Left;   VIDEO BRONCHOSCOPY N/A 05/11/2016   Procedure: VIDEO BRONCHOSCOPY, LEFT LUNG;  Surgeon: [name], MD;  Location: [place];  Service: Thoracic;  Laterality: N/A;   VIDEO BRONCHOSCOPY N/A 05/18/2016   Procedure: VIDEO BRONCHOSCOPY WITH BRONCHIAL WASHING;  Surgeon: [name], MD;  Location: [facility];  Service: Thoracic;  Laterality: N/A;    Current Medications: Current Meds  Medication Sig   furosemide (LASIX) 20 MG tablet Take 1 tablet (20 mg total) by mouth 3 times weekly as needed for lower extremity swelling.     Allergies:  Tramadol   Social History   Socioeconomic History   Marital status: Married    Spouse name: Not on file   Number of children: Not on file   Years of education: Not on file   Highest education level: Not on file  Occupational History   Not on file  Tobacco Use   Smoking status: Former    Packs/day: 1.00    Types: Cigarettes    Quit date: 03/05/2016    Years since quitting: 5.2   Smokeless tobacco: Never  Vaping Use   Vaping Use: Never used  Substance and Sexual Activity   Alcohol use: Yes    Alcohol/week: 6.0 standard drinks    Types: 6 Cans of beer per week   Drug use: Yes    Types: Cocaine, Heroin    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  ROS:   Review of Systems  Constitutional:  Negative for chills and fever.  HENT:  Negative for nosebleeds and tinnitus.   Eyes:  Negative for blurred vision and pain.  Respiratory:  Negative for hemoptysis and sputum production.   Cardiovascular:  Positive for leg swelling. Negative for chest pain, palpitations, orthopnea, claudication and PND.  Gastrointestinal:  Negative for diarrhea, nausea and vomiting.  Genitourinary:  Negative for dysuria and frequency.  Musculoskeletal:  Negative for myalgias and neck pain.  Neurological:  Negative for dizziness and seizures.  Endo/Heme/Allergies:  Does not bruise/bleed easily.  Psychiatric/Behavioral:  Negative for depression and suicidal ideas.     EKGs/Labs/Other Studies Reviewed:    The following studies were reviewed today:  CT Chest 05/29/2021: COMPARISON:  03/01/2021, 02/02/2021, 07/19/2020   FINDINGS: CT CHEST FINDINGS   Cardiovascular: Right chest port catheter. Aortic atherosclerosis. 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 Normal heart size. Left coronary artery calcifications. No pericardial effusion.   Mediastinum/Nodes: No enlarged mediastinal, hilar, or axillary lymph nodes. 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 Thyroid gland, trachea, and esophagus demonstrate no  significant findings.   Lungs/Pleura: Moderate centrilobular and paraseptal emphysema. Mild, diffuse bilateral bronchial wall thickening. Status post left lower lobectomy and wedge resection of the posterior left upper lobe. Unchanged small pulmonary nodules of the right lung, including a 0.5 cm nodule of the anterior right upper lobe (series 6, image 86) and a 0.5 cm nodule of the dependent right lower lobe (series 6, image 151). Calcifications of the dependent right pleura. No pleural effusion or pneumothorax.   Musculoskeletal: No chest wall mass. Unchanged, expansile, mildly sclerotic lesion of the spinous process of T4 (series 2, image 20).   CT ABDOMEN PELVIS FINDINGS   Hepatobiliary: There is a hypodense lesion adjacent to the left portal vein in the left lobe of the liver, although poorly characterized on this noncontrast CT appears to have slightly enlarged over time, now measuring approximately 2.5  x 1.2 cm (series 2, image 71). Multiple subcentimeter low-attenuation lesions of the liver are otherwise unchanged (series 2, image 64, 65, 67, 77). No gallstones or gallbladder wall thickening. Mild intra and extrahepatic biliary ductal dilatation, common bile duct measuring up to 0.9 cm, unchanged compared to multiple prior examinations and of doubtful significance.   Pancreas: Unremarkable. No pancreatic ductal dilatation or surrounding inflammatory changes.   Spleen: Normal in size without significant abnormality.   Adrenals/Urinary Tract: Adrenal glands are unremarkable. Kidneys are normal, without renal calculi, solid lesion, or hydronephrosis. Bladder is unremarkable.   Stomach/Bowel: Stomach is within normal limits. Appendix appears normal. No evidence of bowel wall thickening, distention, or inflammatory changes.   Vascular/Lymphatic: Aortic atherosclerosis. Status post aortobiiliac stent endograft repair. No enlarged abdominal or pelvic lymph nodes.    Reproductive: Prostatomegaly.   Other: No abdominal wall hernia or abnormality. No ascites.   Musculoskeletal: No acute osseous findings.   IMPRESSION: 1. Status post left lower lobectomy and wedge resection of the posterior left upper lobe. 2. Unchanged small pulmonary nodules of the right lung. Attention on follow-up. 3. Unchanged, expansile, mildly sclerotic lesion of the spinous process of T4, which remains of uncertain significance. Attention on follow-up. 4. There is a hypodense lesion adjacent to the left portal vein in the left lobe of the liver, although poorly characterized on this noncontrast CT appears to have slightly enlarged over time, now measuring approximately 2.5 x 1.2 cm. This is modestly suspicious for metastasis. Consider multiphasic contrast enhanced MR to further characterize. Otherwise attention on follow-up. 5. Multiple subcentimeter low-attenuation lesions of the liver are otherwise unchanged, which are incompletely characterized by noncontrast CT. Attention on follow-up. 6. Status post aortobiiliac stent endograft repair. 7. Emphysema. 8. 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 Recommend annual imaging followup by CTA or MRA. This recommendation follows 2010 ACCF/AHA/AATS/ACR/ASA/SCA/SCAI/SIR/STS/SVM Guidelines for the Diagnosis and Management of Patients with Thoracic Aortic Disease. Circulation. 2010; 121: G956-O130. Aortic aneurysm NOS (ICD10-I71.9)   Aortic Atherosclerosis (ICD10-I70.0) and Emphysema (ICD10-J43.9).  Echo 12/29/2020:  1. Left ventricular ejection fraction, by estimation, is 60 to 65%. The  left ventricle has normal function. The left ventricle has no regional  wall motion abnormalities. There is severe asymmetric left ventricular  hypertrophy of the basal-septal segment. Left ventricular diastolic parameters are indeterminate.   2. Right ventricular systolic function is normal.  The right ventricular  size is normal. There is moderately elevated pulmonary artery systolic  pressure. The estimated right ventricular systolic pressure is 86.5 mmHg.   3. The aortic valve is normal in structure. Aortic valve regurgitation is  trivial. No aortic stenosis is present.   4. Aortic dilatation noted. There is dilatation of the aortic root,  measuring 45 mm. There is dilatation of the ascending aorta, measuring 42 mm.   5. The mitral valve is myxomatous. Moderate to severe mitral valve  regurgitation. Significant thickening and prolapse of anterior leaflet  causing eccentric posterior directed MR jet. Difficult to quantify MR  given eccentric jet, but no LA/LV dilatation and E-wave velocity less than 1.2 m/s argues against severe MR. Would consider TEE or cardiac MRI to quantify MR severity   Right/Left Heart Cath 03/10/2020: 1. Patent coronary arteries with nonobstructive CAD, primarily affecting the mid-RCA. Widely patent LAD and LCx 2. Normal right heart pressures, including normal PCWP 3. Normal LVEDP   Recommend: further review with multidisciplinary heart team, considering transcatheter edge to edge mitral valve repair versus  ongoing observation/med rx   TEE 10/07/2019   1. There is 4+ primary mitral regurgitation with myxomatous anterior  mitral valve leaflet, with prolapse of all three scallops. The jet is  posteriorly directed, eccentric with reversal of pulmonary vein forward  flow bilateraly. PISA radius 1.0 cm, ERO  0.45 cm2, regurgitation volume 58 ml. Posterior leaflet measures 1.0 cm.  Flail gap 0.3 cm.   2. Left ventricular ejection fraction, by estimation, is 60 to 65%. The  left ventricle has normal function. The left ventricle has no regional  wall motion abnormalities.   3. Right ventricular systolic function is normal. The right ventricular  size is normal. There is normal pulmonary artery systolic pressure.   4. Left atrial size was moderately  dilated. No left atrial/left atrial  appendage thrombus was detected. The LAA emptying velocity was 40 cm/s.   5. Right atrial size was mildly dilated.   6. The mitral valve is myxomatous. No evidence of mitral valve  regurgitation. No evidence of mitral stenosis.   7. Tricuspid valve regurgitation is moderate.   8. The aortic valve is normal in structure. Aortic valve regurgitation is  trivial. No aortic stenosis is present.   9. There is moderate dilatation at the level of the sinuses of Valsalva  measuring 48 mm. There is mild (Grade II) plaque.   TTE: 08/18/2019    1. Left ventricular ejection fraction, by estimation, is 60 to 65%. The  left ventricle has normal function. The left ventricle has no regional  wall motion abnormalities. There is mild left ventricular hypertrophy.  Left ventricular diastolic parameters  are consistent with Grade I diastolic dysfunction (impaired relaxation).   2. Right ventricular systolic function is normal. The right ventricular  size is normal. There is severely elevated pulmonary artery systolic  pressure. The estimated right ventricular systolic pressure is 60.4 mmHg.   3. Left atrial size was moderately dilated.   4. The mitral valve is myxomatous. Severe mitral valve regurgitation.   5. The aortic valve is tricuspid. Aortic valve regurgitation is not  visualized.   6. Aortic dilatation noted. Aneurysm of the ascending aorta, measuring 40  mm. There is moderate to severe dilatation at the level of the sinuses of  Valsalva measuring 49 mm.   EKG:  EKG is personally reviewed. 06/07/2021: Sinus rhythm. Rate 87 bpm. First degree AV block.  Nonspecific ST changes.   Recent Labs: 05/24/2021: ALT 16; BUN 20; Creatinine 1.60; Hemoglobin 16.4; Platelet Count 248; Potassium 3.5; Sodium 138; TSH 2.237   Recent Lipid Panel No results found for: CHOL, TRIG, HDL, CHOLHDL, VLDL, LDLCALC, LDLDIRECT   Risk Assessment/Calculations:           Physical  Exam:    VS:  BP (!) 144/90    Pulse 87    Ht 6\' 3"  (1.905 m)    Wt 152 lb 12.8 oz (69.3 kg)    SpO2 93%    BMI 19.10 kg/m     Wt Readings from Last 3 Encounters:  06/07/21 152 lb 12.8 oz (69.3 kg)  03/02/21 134 lb 1.6 oz (60.8 kg)  02/02/21 137 lb 1.6 oz (62.2 kg)     GEN: Thin, comfortable HEENT: Normal NECK: No JVD; No carotid bruits CARDIAC: RRR, 3/6 systolic murmur heard throughout the precordium. No rubs, gallops RESPIRATORY:  Diminished but clear ABDOMEN: Soft, non-tender, non-distended MUSCULOSKELETAL:  1+ pitting edema to mid shin with R slightly greater than left SKIN: Warm and dry NEUROLOGIC:  Alert and oriented  x 3 PSYCHIATRIC:  Normal affect   ASSESSMENT:    1. Severe mitral regurgitation   2. Mitral valve prolapse   3. Essential hypertension   4. Mitral valve insufficiency, unspecified etiology   5. Medication management   6. Malignant neoplasm of lower lobe of left lung ([place])   7. Aneurysm of ascending aorta without rupture    PLAN:    In order of problems listed above:  #Mitral Valve Prolapse with Severe MR: TEE 06/21 anterior MV prolapse with severe MR with PISA radius 1.0cm, EROA 0.45, regurgitant volume estimated 58 mL. He underwent cardiac surgical evaluation with Dr. Roxy Manns 01/07/2020 and then referred to Dr. Burt Knack for consideration of transcatheter edge-to-edge mitral valve repair as a less invasive treatment option in the context of his lung cancer. He underwent RHC/LHC that showed low LVEDP, nonobstructive CAD and normal right-sided pressures.  Because his symptoms had improved and he has significant comorbidites, the decision was made to continue serial TTEs for monitoring. Last TTE 12/2020 with EF 60-65%, moderate-to-severe MR. E wave <1.2 and no significant LA/LV dilation so may be more moderate. Currently symptoms are stable to improving.  -Continue surveillance TTEs every 6 months with next 06/2021 -Resume lisinopril 10mg  daily for afterload  reduction -Start low dose lasix 20mg  3x/week prn for LE edema -BMET next week  #Metastatic Lung Cancer: S/p wedge resection with 4 cycles of adjuvant systemic chemotherapy with cisplatin and Alimta. Had recurrence in 2019 and initiated on carboplatin, alimta, and [place]. He is status post 47 cycles. He had been on maintenance Alimta and Keytruda every 3 weeks. His Alimta was discontinued to to renal insufficiency and Beryle Flock was stopped due to disease progression. He is now being monitored with serial CT imaging.  #HTN: Elevated due to stopping all medications. Will resume low dose lisinopril. -Resume lisinopril 10mg  daily -BMET next week  #Ascending aortic aneurysm: TTE with aortic root 4mm and ascending aorta 24mm. -Serial monitoring -Manage HTN as above -CT chest pending        Follow-up in 6 months.  Medication Adjustments/Labs and Tests Ordered: Current medicines are reviewed at length with the patient today.  Concerns regarding medicines are outlined above.   Orders Placed This Encounter  Procedures   Basic metabolic panel   EKG [id number]   ECHOCARDIOGRAM COMPLETE   Meds ordered this encounter  Medications   lisinopril (ZESTRIL) 10 MG tablet    Sig: Take 1 tablet (10 mg total) by mouth daily.    Dispense:  90 tablet    Refill:  2   furosemide (LASIX) 20 MG tablet    Sig: Take 1 tablet (20 mg total) by mouth 3 times weekly as needed for lower extremity swelling.    Dispense:  45 tablet    Refill:  3   Patient Instructions  Medication Instructions:   START TAKING LISINOPRIL 10 MG BY MOUTH DAILY  START TAKING LASIX 20 MG BY MOUTH 3 TIMES WEEKLY ONLY AS NEEDED FOR LOWER EXTREMITY SWELLING  *If you need a refill on your cardiac medications before your next appointment, please call your pharmacy*   Lab Work:  [place] OFFICE--BMET  If you have labs (blood work) drawn today and your tests are completely normal, you will receive your results only  by: [place] (if you have MyChart) OR A paper copy in the mail If you have any lab test that is abnormal or we need to change your treatment, we will call you to review  the results.   Testing/Procedures:  Your physician has requested that you have an echocardiogram. Echocardiography is a painless test that uses sound waves to create images of your heart. It provides your doctor with information about the size and shape of your heart and how well your hearts chambers and valves are working. This procedure takes approximately one hour. There are no restrictions for this procedure. SCHEDULE ECHO TO BE DONE IN MARCH 2023 PER DR. Johney Frame   Follow-Up: At [facility], you and your health needs are our priority.  As part of our continuing mission to provide you with exceptional heart care, we have created designated Provider Care Teams.  These Care Teams include your primary Cardiologist (physician) and Advanced Practice Providers (APPs -  Physician Assistants and Nurse Practitioners) who all work together to provide you with the care you need, when you need it.  We recommend signing up for the patient portal called "MyChart".  Sign up information is provided on this After Visit Summary.  MyChart is used to connect with patients for Virtual Visits (Telemedicine).  Patients are able to view lab/test results, encounter notes, upcoming appointments, etc.  Non-urgent messages can be sent to your provider as well.   To learn more about what you can do with MyChart, go to [email or web address].    Your next appointment:   6 month(s)  The format for your next appointment:   In Person  Provider:   DR. Delmar Landau Stumpf,acting as a scribe for Freada Bergeron, MD.,have documented all relevant documentation on the behalf of Freada Bergeron, MD,as directed by  Freada Bergeron, MD while in the presence of Freada Bergeron, MD.  I, Freada Bergeron, MD, have  reviewed all documentation for this visit. The documentation on 06/07/21 for the exam, diagnosis, procedures, and orders are all accurate and complete.   Signed, Freada Bergeron, MD  06/07/2021 12:44 PM    [place]

## 2021-06-08 ENCOUNTER — Inpatient Hospital Stay: Payer: Federal, State, Local not specified - PPO

## 2021-06-08 DIAGNOSIS — Z95828 Presence of other vascular implants and grafts: Secondary | ICD-10-CM

## 2021-06-08 DIAGNOSIS — C3412 Malignant neoplasm of upper lobe, left bronchus or lung: Secondary | ICD-10-CM | POA: Diagnosis not present

## 2021-06-08 DIAGNOSIS — Z452 Encounter for adjustment and management of vascular access device: Secondary | ICD-10-CM | POA: Diagnosis not present

## 2021-06-08 DIAGNOSIS — Z79899 Other long term (current) drug therapy: Secondary | ICD-10-CM | POA: Diagnosis not present

## 2021-06-08 MED ORDER — HEPARIN SOD (PORK) LOCK FLUSH 100 UNIT/ML IV SOLN
500.0000 [IU] | Freq: Once | INTRAVENOUS | Status: AC
  Administered 2021-06-08: 500 [IU]

## 2021-06-08 MED ORDER — SODIUM CHLORIDE 0.9% FLUSH
10.0000 mL | Freq: Once | INTRAVENOUS | Status: AC
  Administered 2021-06-08: 10 mL

## 2021-06-14 ENCOUNTER — Other Ambulatory Visit: Payer: Self-pay

## 2021-06-14 ENCOUNTER — Other Ambulatory Visit: Payer: Federal, State, Local not specified - PPO | Admitting: *Deleted

## 2021-06-14 DIAGNOSIS — I10 Essential (primary) hypertension: Secondary | ICD-10-CM

## 2021-06-14 DIAGNOSIS — Z79899 Other long term (current) drug therapy: Secondary | ICD-10-CM | POA: Diagnosis not present

## 2021-06-14 DIAGNOSIS — I341 Nonrheumatic mitral (valve) prolapse: Secondary | ICD-10-CM | POA: Diagnosis not present

## 2021-06-14 DIAGNOSIS — I340 Nonrheumatic mitral (valve) insufficiency: Secondary | ICD-10-CM | POA: Diagnosis not present

## 2021-06-15 LAB — BASIC METABOLIC PANEL
BUN/Creatinine Ratio: 10 (ref 10–24)
BUN: 19 mg/dL (ref 8–27)
CO2: 19 mmol/L — ABNORMAL LOW (ref 20–29)
Calcium: 9.8 mg/dL (ref 8.6–10.2)
Chloride: 101 mmol/L (ref 96–106)
Creatinine, Ser: 1.83 mg/dL — ABNORMAL HIGH (ref 0.76–1.27)
Glucose: 126 mg/dL — ABNORMAL HIGH (ref 70–99)
Potassium: 4.3 mmol/L (ref 3.5–5.2)
Sodium: 135 mmol/L (ref 134–144)
eGFR: 40 mL/min/{1.73_m2} — ABNORMAL LOW (ref >59)

## 2021-06-21 ENCOUNTER — Other Ambulatory Visit: Payer: Federal, State, Local not specified - PPO

## 2021-06-26 ENCOUNTER — Other Ambulatory Visit: Payer: Self-pay

## 2021-06-26 ENCOUNTER — Inpatient Hospital Stay: Payer: Federal, State, Local not specified - PPO | Attending: Internal Medicine | Admitting: Internal Medicine

## 2021-06-26 VITALS — BP 102/71 | HR 123 | Temp 97.1°F | Resp 20 | Ht 75.0 in | Wt 149.7 lb

## 2021-06-26 DIAGNOSIS — C3432 Malignant neoplasm of lower lobe, left bronchus or lung: Secondary | ICD-10-CM | POA: Insufficient documentation

## 2021-06-26 DIAGNOSIS — C3412 Malignant neoplasm of upper lobe, left bronchus or lung: Secondary | ICD-10-CM | POA: Diagnosis not present

## 2021-06-26 DIAGNOSIS — C3490 Malignant neoplasm of unspecified part of unspecified bronchus or lung: Secondary | ICD-10-CM

## 2021-06-26 DIAGNOSIS — Z79899 Other long term (current) drug therapy: Secondary | ICD-10-CM | POA: Diagnosis not present

## 2021-06-26 DIAGNOSIS — N289 Disorder of kidney and ureter, unspecified: Secondary | ICD-10-CM | POA: Insufficient documentation

## 2021-06-26 DIAGNOSIS — C787 Secondary malignant neoplasm of liver and intrahepatic bile duct: Secondary | ICD-10-CM | POA: Insufficient documentation

## 2021-06-26 NOTE — Progress Notes (Signed)
[place] [phone]   [phone]  OFFICE PROGRESS NOTE  [name], MD [address] [place] [address]  DIAGNOSIS: Metastatic non-small cell lung cancer, adenocarcinoma initially diagnosed as stage IB (T2a, N0, M0) non-small cell lung cancer, adenocarcinoma diagnosed in December 2017.  The patient has evidence for disease recurrence in July 2019.  Biomarker Findings Tumor Mutational Burden - TMB-Intermediate (16 Muts/Mb) Microsatellite status - MS-Stable Genomic Findings For a complete list of the genes assayed, please refer to the Appendix. KIT amplification KRAS B51W SMAD4 splice site 2585-2D>P OE42 I232F 7 Disease relevant genes with no reportable alterations: EGFR, ALK, BRAF, MET, RET, ERBB2, ROS1   PDL 1 expression is 0%  PRIOR THERAPY:  1) status post left lower lobectomy as well as wedge resection of the left upper lobe on 05/11/2016. 2) Adjuvant systemic chemotherapy with cisplatin 75 MG/M2 and Alimta 500 MG/M2 every 3 weeks. First dose 07/03/2016. Status post 4 cycles. 3) Systemic chemotherapy with carboplatin for AUC of 5, Alimta 500 mg/M2 and Keytruda 200 mg IV every 3 weeks.  Status post 47 cycles.  Starting from cycle #5 the patient will be treated with maintenance Alimta and Ketruda (pembrolizumab) every 3 weeks.  This treatment was discontinued secondary to disease progression. 4) Lumakras (Sotorasib) 960 mg p.o. daily started October 29, 2020.  Status post 1 months of treatment.  This treatment was discontinued secondary to liver toxicity even after dose reduction.  CURRENT THERAPY: None  INTERVAL HISTORY: [name]. 66 y.o. male returns to the clinic today for follow-up visit.  The patient is feeling fine today with no concerning complaints.  He denied having any current chest pain, shortness of breath, cough or hemoptysis.  He has no nausea, vomiting, diarrhea or constipation.  He has no headache or  visual changes.  He denied having any recent weight loss or night sweats.  He was seen by his cardiologist recently and started on treatment with Lasix for swelling of the lower extremities.  He denied having any fever or chills.  He discontinued his treatment with Lumakras (Sotorasib) secondary to liver dysfunction even after reduction of his dose to 480 mg p.o. daily. He has been in observation for the last 4-5 months.  He is here today for evaluation and discussion of his scan results that was performed few weeks ago.  MEDICAL HISTORY: Past Medical History:  Diagnosis Date   AAA (abdominal aortic aneurysm)    AAA (abdominal aortic aneurysm) without rupture 02/04/2014   Cancer ([place])    lung   Encounter for antineoplastic chemotherapy 06/06/2016   History of hepatitis B    HNP (herniated nucleus pulposus), lumbar    L4 with radiculopathy   Hypertension    Lung cancer ([place]) 05/18/2016   Malignant neoplasm of lower lobe of left lung ([place]) 04/05/2016   Mass of left lung    Mitral insufficiency 04/06/2016   This patient will eventually need MV repair if his prognosis is good from oncology standpoint. However, his lung cancer therapy  is the priority right now. His cardiac condition won't preclude possible lung surgery.  Once his lung cancer is under control he will need a TEE to further evaluate his mitral valve anatomy and MR severity, and also have ischemic workup as tere is evidence on calcificati   Renal insufficiency 10/09/2016   S/P partial lobectomy of lung 05/11/2016   Varicose veins of legs  ALLERGIES:  is allergic to tramadol.  MEDICATIONS:  Current Outpatient Medications  Medication Sig Dispense Refill   amoxicillin-clavulanate (AUGMENTIN) 875-125 MG tablet Take 1 tablet by mouth 2 (two) times daily. (Patient not taking: Reported on 06/07/2021) 14 tablet 0   carvedilol (COREG CR) 10 MG 24 hr capsule Take 10 mg by mouth daily. (Patient not taking: Reported on 06/07/2021)      furosemide (LASIX) 20 MG tablet Take 1 tablet (20 mg total) by mouth 3 times weekly as needed for lower extremity swelling. 45 tablet 3   lisinopril (ZESTRIL) 10 MG tablet Take 1 tablet (10 mg total) by mouth daily. 90 tablet 2   loperamide (IMODIUM A-D) 2 MG tablet Take 2 mg by mouth 4 (four) times daily as needed for diarrhea or loose stools. (Patient not taking: Reported on 06/07/2021)     predniSONE (DELTASONE) 10 MG tablet Take 6 tablets (60 mg) daily for 7 days, followed by 4 tablets (40 mg daily) for 7 days, followed by 2 tablets (20 mg) daily for 7 days, followed by 1 tablet (10 mg) daily for 10 days (Patient not taking: Reported on 06/07/2021) 91 tablet 0   No current facility-administered medications for this visit.    SURGICAL HISTORY:  Past Surgical History:  Procedure Laterality Date   ABDOMINAL AORTIC ENDOVASCULAR STENT GRAFT N/A 03/12/2016   Procedure: ABDOMINAL AORTIC ENDOVASCULAR STENT GRAFT;  Surgeon: Conrad Lake [place], MD;  Location: [facility];  Service: Vascular;  Laterality: N/A;   COLONOSCOPY     INGUINAL HERNIA REPAIR Left 1975   Left inguinal hernia   INGUINAL HERNIA REPAIR Right    IR IMAGING GUIDED PORT INSERTION  09/04/2019   LOBECTOMY Left 05/11/2016   Procedure: LEFT LOWER LOBE LOBECTOMY AND LEFT UPPER LOBE  RESECTION AND PLACEMENT OF ON-Q;  Surgeon: [name], MD;  Location: [facility];  Service: Thoracic;  Laterality: Left;   LUMBAR LAMINECTOMY  October 22, 2012   LYMPH NODE DISSECTION Left 05/11/2016   Procedure: LYMPH NODE DISSECTION;  Surgeon: [name], MD;  Location: [facility];  Service: Thoracic;  Laterality: Left;   RIGHT/LEFT HEART CATH AND CORONARY ANGIOGRAPHY N/A 03/10/2020   Procedure: RIGHT/LEFT HEART CATH AND CORONARY ANGIOGRAPHY;  Surgeon: Sherren Mocha, MD;  Location: [facility];  Service: Cardiovascular;  Laterality: N/A;   STAPLING OF BLEBS Left 05/11/2016   Procedure: STAPLING OF APICAL BLEB;  Surgeon: [name], MD;  Location: [facility];   Service: Thoracic;  Laterality: Left;   TEE WITHOUT CARDIOVERSION N/A 10/07/2019   Procedure: TRANSESOPHAGEAL ECHOCARDIOGRAM (TEE);  Surgeon: Dorothy Spark, MD;  Location: [facility];  Service: Cardiovascular;  Laterality: N/A;   VIDEO ASSISTED THORACOSCOPY Left 05/18/2016   Procedure: VIDEO ASSISTED THORACOSCOPY WITH REMOVAL OF LEFT APICAL BLEB;  Surgeon: [name], MD;  Location: [facility];  Service: Thoracic;  Laterality: Left;   VIDEO ASSISTED THORACOSCOPY (VATS)/WEDGE RESECTION Left 05/11/2016   Procedure: LEFT VIDEO ASSISTED THORACOSCOPY (VATS);  Surgeon: [name], MD;  Location: [facility];  Service: Thoracic;  Laterality: Left;   VIDEO BRONCHOSCOPY N/A 05/11/2016   Procedure: VIDEO BRONCHOSCOPY, LEFT LUNG;  Surgeon: [name], MD;  Location: [facility];  Service: Thoracic;  Laterality: N/A;   VIDEO BRONCHOSCOPY N/A 05/18/2016   Procedure: VIDEO BRONCHOSCOPY WITH BRONCHIAL WASHING;  Surgeon: [name], MD;  Location: [facility];  Service: Thoracic;  Laterality: N/A;    REVIEW OF SYSTEMS:  Constitutional: negative Eyes: negative Ears, nose, mouth,  throat, and face: negative Respiratory: negative Cardiovascular: positive for tachycardia Gastrointestinal: negative Genitourinary:negative Integument/breast: negative Hematologic/lymphatic: negative Musculoskeletal:negative Neurological: negative Behavioral/Psych: negative Endocrine: negative Allergic/Immunologic: negative   PHYSICAL EXAMINATION: General appearance: alert, cooperative, fatigued, and no distress Head: Normocephalic, without obvious abnormality, atraumatic Neck: no adenopathy, no JVD, supple, symmetrical, trachea midline, and thyroid not enlarged, symmetric, no tenderness/mass/nodules Lymph nodes: Cervical, supraclavicular, and axillary nodes normal. Resp: clear to auscultation bilaterally Back: symmetric, no curvature. ROM normal. No CVA tenderness. Cardio: Tachycardia GI: soft, non-tender; bowel  sounds normal; no masses,  no organomegaly Extremities: extremities normal, atraumatic, no cyanosis or edema Neurologic: Alert and oriented X 3, normal strength and tone. Normal symmetric reflexes. Normal coordination and gait   ECOG PERFORMANCE STATUS: 1 - Symptomatic but completely ambulatory  Blood pressure 102/71, pulse (!) 123, temperature (!) 97.1 F (36.2 C), temperature source Tympanic, resp. rate 20, height _0  (1.905 m), weight 149 lb 11.2 oz (67.9 kg), SpO2 98 %.  LABORATORY DATA: Lab Results  Component Value Date   WBC 9.6 05/24/2021   HGB 16.4 05/24/2021   HCT 51.0 05/24/2021   MCV 92.7 05/24/2021   PLT 248 05/24/2021      Chemistry      Component Value Date/Time   NA 135 06/14/2021 1342   NA 138 01/11/2017 1343   K 4.3 06/14/2021 1342   K 4.7 01/11/2017 1343   CL 101 06/14/2021 1342   CO2 19 (L) 06/14/2021 1342   CO2 23 01/11/2017 1343   BUN 19 06/14/2021 1342   BUN 20.2 01/11/2017 1343   CREATININE 1.83 (H) 06/14/2021 1342   CREATININE 1.60 (H) 05/24/2021 1118   CREATININE 1.6 (H) 01/11/2017 1343      Component Value Date/Time   CALCIUM 9.8 06/14/2021 1342   CALCIUM 9.3 01/11/2017 1343   ALKPHOS 112 05/24/2021 1118   ALKPHOS 89 01/11/2017 1343   AST 27 05/24/2021 1118   AST 41 (H) 01/11/2017 1343   ALT 16 05/24/2021 1118   ALT 27 01/11/2017 1343   BILITOT 0.5 05/24/2021 1118   BILITOT 0.49 01/11/2017 1343       RADIOGRAPHIC STUDIES: CT Abdomen Pelvis Wo Contrast  Result Date: 05/30/2021 CLINICAL DATA:  Metastatic lung cancer, status post left lower lobectomy and left upper lobe wedge resection EXAM: CT CHEST, ABDOMEN AND PELVIS WITHOUT CONTRAST TECHNIQUE: Multidetector CT imaging of the chest, abdomen and pelvis was performed following the standard protocol without IV contrast. Oral enteric contrast was administered. RADIATION DOSE REDUCTION: This exam was performed according to the departmental dose-optimization program which includes automated  exposure control, adjustment of the mA and/or kV according to patient size and/or use of iterative reconstruction technique. COMPARISON:  03/01/2021, 02/02/2021, 07/19/2020 FINDINGS: CT CHEST FINDINGS Cardiovascular: Right chest port catheter. Aortic atherosclerosis. 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 Normal heart size. Left coronary artery calcifications. No pericardial effusion. Mediastinum/Nodes: No enlarged mediastinal, hilar, or axillary lymph nodes. 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 Thyroid gland, trachea, and esophagus demonstrate no significant findings. Lungs/Pleura: Moderate centrilobular and paraseptal emphysema. Mild, diffuse bilateral bronchial wall thickening. Status post left lower lobectomy and wedge resection of the posterior left upper lobe. Unchanged small pulmonary nodules of the right lung, including a 0.5 cm nodule of the anterior right upper lobe (series 6, image 86) and  a 0.5 cm nodule of the dependent right lower lobe (series 6, image 151). Calcifications of the dependent right pleura. No pleural effusion or pneumothorax. Musculoskeletal: No chest wall mass. Unchanged, expansile, mildly sclerotic lesion of the spinous process of T4 (series 2, image 20). CT ABDOMEN PELVIS FINDINGS Hepatobiliary: There is a hypodense lesion adjacent to the left portal vein in the left lobe of the liver, although poorly characterized on this noncontrast CT appears to have slightly enlarged over time, now measuring approximately 2.5 x 1.2 cm (series 2, image 71). Multiple subcentimeter low-attenuation lesions of the liver are otherwise unchanged (series 2, image 64, 65, 67, 77). No gallstones or gallbladder wall thickening. Mild intra and extrahepatic biliary ductal  dilatation, common bile duct measuring up to 0.9 cm, unchanged compared to multiple prior examinations and of doubtful significance. Pancreas: Unremarkable. No pancreatic ductal dilatation or surrounding inflammatory changes. Spleen: Normal in size without significant abnormality. Adrenals/Urinary Tract: Adrenal glands are unremarkable. Kidneys are normal, without renal calculi, solid lesion, or hydronephrosis. Bladder is unremarkable. Stomach/Bowel: Stomach is within normal limits. Appendix appears normal. No evidence of bowel wall thickening, distention, or inflammatory changes. Vascular/Lymphatic: Aortic atherosclerosis. Status post aortobiiliac stent endograft repair. No enlarged abdominal or pelvic lymph nodes. Reproductive: Prostatomegaly. Other: No abdominal wall hernia or abnormality. No ascites. Musculoskeletal: No acute osseous findings. IMPRESSION: 1. Status post left lower lobectomy and wedge resection of the posterior left upper lobe. 2. Unchanged small pulmonary nodules of the right lung. Attention on follow-up. 3. Unchanged, expansile, mildly sclerotic lesion of the spinous process of T4, which remains of uncertain significance. Attention on follow-up. 4. There is a hypodense lesion adjacent to the left portal vein in the left lobe of the liver, although poorly characterized on this noncontrast CT appears to have slightly enlarged over time, now measuring approximately 2.5 x 1.2 cm. This is modestly suspicious for metastasis. Consider multiphasic contrast enhanced MR to further characterize. Otherwise attention on follow-up. 5. Multiple subcentimeter low-attenuation lesions of the liver are otherwise unchanged, which are incompletely characterized by noncontrast CT. Attention on follow-up. 6. Status post aortobiiliac stent endograft repair. 7. Emphysema. 8. 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 Recommend annual imaging followup by CTA or MRA. This  recommendation follows 2010 ACCF/AHA/AATS/ACR/ASA/SCA/SCAI/SIR/STS/SVM Guidelines for the Diagnosis and Management of Patients with Thoracic Aortic Disease. Circulation. 2010; 121: M010-U725. Aortic aneurysm NOS (ICD10-I71.9) Aortic Atherosclerosis (ICD10-I70.0) and Emphysema (ICD10-J43.9). Electronically Signed   By: Delanna Ahmadi M.D.   On: 05/30/2021 11:35   CT Chest Wo Contrast  Result Date: 05/30/2021 CLINICAL DATA:  Metastatic lung cancer, status post left lower lobectomy and left upper lobe wedge resection EXAM: CT CHEST, ABDOMEN AND PELVIS WITHOUT CONTRAST TECHNIQUE: Multidetector CT imaging of the chest, abdomen and pelvis was performed following the standard protocol without IV contrast. Oral enteric contrast was administered. RADIATION DOSE REDUCTION: This exam was performed according to the departmental dose-optimization program which includes automated exposure control, adjustment of the mA and/or kV according to patient size and/or use of iterative reconstruction technique. COMPARISON:  03/01/2021, 02/02/2021, 07/19/2020 FINDINGS: CT CHEST FINDINGS Cardiovascular: Right chest port catheter. Aortic atherosclerosis. 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 Normal heart size. Left coronary artery calcifications. No pericardial effusion. Mediastinum/Nodes: No enlarged mediastinal, hilar, or axillary lymph nodes. Unchanged anterior mediastinal nodule with scattered peripheral calcifications measuring up to 1.5 x 1.1 cm, stable compared to multiple prior examinations, possibly  reflecting a small thymic cyst (series 2, image 40). Thyroid gland, trachea, and esophagus demonstrate no significant findings. Lungs/Pleura: Moderate centrilobular and paraseptal emphysema. Mild, diffuse bilateral bronchial wall thickening. Status post left lower lobectomy and wedge resection of the posterior left upper lobe. Unchanged small pulmonary nodules of the right lung,  including a 0.5 cm nodule of the anterior right upper lobe (series 6, image 86) and a 0.5 cm nodule of the dependent right lower lobe (series 6, image 151). Calcifications of the dependent right pleura. No pleural effusion or pneumothorax. Musculoskeletal: No chest wall mass. Unchanged, expansile, mildly sclerotic lesion of the spinous process of T4 (series 2, image 20). CT ABDOMEN PELVIS FINDINGS Hepatobiliary: There is a hypodense lesion adjacent to the left portal vein in the left lobe of the liver, although poorly characterized on this noncontrast CT appears to have slightly enlarged over time, now measuring approximately 2.5 x 1.2 cm (series 2, image 71). Multiple subcentimeter low-attenuation lesions of the liver are otherwise unchanged (series 2, image 64, 65, 67, 77). No gallstones or gallbladder wall thickening. Mild intra and extrahepatic biliary ductal dilatation, common bile duct measuring up to 0.9 cm, unchanged compared to multiple prior examinations and of doubtful significance. Pancreas: Unremarkable. No pancreatic ductal dilatation or surrounding inflammatory changes. Spleen: Normal in size without significant abnormality. Adrenals/Urinary Tract: Adrenal glands are unremarkable. Kidneys are normal, without renal calculi, solid lesion, or hydronephrosis. Bladder is unremarkable. Stomach/Bowel: Stomach is within normal limits. Appendix appears normal. No evidence of bowel wall thickening, distention, or inflammatory changes. Vascular/Lymphatic: Aortic atherosclerosis. Status post aortobiiliac stent endograft repair. No enlarged abdominal or pelvic lymph nodes. Reproductive: Prostatomegaly. Other: No abdominal wall hernia or abnormality. No ascites. Musculoskeletal: No acute osseous findings. IMPRESSION: 1. Status post left lower lobectomy and wedge resection of the posterior left upper lobe. 2. Unchanged small pulmonary nodules of the right lung. Attention on follow-up. 3. Unchanged, expansile, mildly  sclerotic lesion of the spinous process of T4, which remains of uncertain significance. Attention on follow-up. 4. There is a hypodense lesion adjacent to the left portal vein in the left lobe of the liver, although poorly characterized on this noncontrast CT appears to have slightly enlarged over time, now measuring approximately 2.5 x 1.2 cm. This is modestly suspicious for metastasis. Consider multiphasic contrast enhanced MR to further characterize. Otherwise attention on follow-up. 5. Multiple subcentimeter low-attenuation lesions of the liver are otherwise unchanged, which are incompletely characterized by noncontrast CT. Attention on follow-up. 6. Status post aortobiiliac stent endograft repair. 7. Emphysema. 8. 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 Recommend annual imaging followup by CTA or MRA. This recommendation follows 2010 ACCF/AHA/AATS/ACR/ASA/SCA/SCAI/SIR/STS/SVM Guidelines for the Diagnosis and Management of Patients with Thoracic Aortic Disease. Circulation. 2010; 121: Y051-T021. Aortic aneurysm NOS (ICD10-I71.9) Aortic Atherosclerosis (ICD10-I70.0) and Emphysema (ICD10-J43.9). Electronically Signed   By: Delanna Ahmadi M.D.   On: 05/30/2021 11:35    ASSESSMENT AND PLAN:  This is a very pleasant 66 years old white male with a stage IB non-small cell lung cancer, adenocarcinoma status post wedge resection of the left upper lobe followed by 4 cycles of adjuvant systemic chemotherapy with cisplatin and Alimta and he tolerated his treatment well except for fatigue. The patient has been on observation since June 2018.   The recent imaging studies including CT scan of the chest as well as a PET scan showed evidence for disease recurrence with metastatic disease presented with bilateral pulmonary nodules as  well as destructive bone lesions at T4 vertebral body, biopsy proven to be metastatic adenocarcinoma. Molecular studies by foundation 1  showed KRAS G12C mutation.  PDL 1 expression was negative. The patient was treated with carboplatin, Alimta and Ketruda (pembrolizumab) status post 47 cycles.  Starting from cycle #5 he is on maintenance treatment with Alimta and Keytruda every 3 weeks. He has been on treatment with single agent Keytruda recently because of the renal insufficiency.  The patient has been tolerating this treatment well with no concerning complaints.  This treatment was discontinued secondary to disease progression. He started second line treatment with Lumakras (Sotorasib) 960 mg p.o. daily on October 29, 2020. Unfortunately the patient had significant liver dysfunction secondary to the treatment with Lumakras (Sotorasib).  Status post 1 months.  He was treated with a tapered dose of prednisone for liver toxicity.  We resumed his treatment with reduced dose Lumakras (Sotorasib) at 480 mg p.o. daily but again the patient developed significant liver dysfunction within few days of resuming the treatment.  His treatment was discontinued and the patient was treated with a tapered dose of prednisone. The patient has been in observation for the last 4-5 months and he is feeling fine. He had repeat CT scan of the chest, abdomen pelvis performed several weeks ago.  His scan showed no concerning findings for disease progression except for a slightly enlarged hepatic lesion suspicious for metastasis. I discussed the scan results with the patient and recommended for him to continue on observation but we will repeat CT scan of the chest, abdomen pelvis in 1 months for further evaluation of this lesion and to rule out any further disease progression. The patient was advised to call immediately if he has any other concerning symptoms in the interval The patient voices understanding of current disease status and treatment options and is in agreement with the current care plan. All questions were answered. The patient knows to call the clinic  with any problems, questions or concerns. We can certainly see the patient much sooner if necessary. Disclaimer: This note was dictated with voice recognition software. Similar sounding words can inadvertently be transcribed and may be missed upon review. Eilleen Kempf, MD 06/26/21

## 2021-07-12 ENCOUNTER — Ambulatory Visit (HOSPITAL_COMMUNITY): Payer: Medicare Other

## 2021-07-14 ENCOUNTER — Telehealth (HOSPITAL_COMMUNITY): Payer: Self-pay | Admitting: Cardiology

## 2021-07-14 NOTE — Telephone Encounter (Signed)
Patient called and cancelled echocardiogram for reason below: ? ?07/12/2021 10:17 AM By: ?[id number], YVETTE  ?Cancel Rsn: Patient (car trouble, will call back to reschedule) ? ?Order will be removed from the Active echo wq.  ?

## 2021-07-25 ENCOUNTER — Inpatient Hospital Stay: Payer: Federal, State, Local not specified - PPO | Attending: Internal Medicine

## 2021-07-25 ENCOUNTER — Other Ambulatory Visit: Payer: Self-pay

## 2021-07-25 DIAGNOSIS — C7951 Secondary malignant neoplasm of bone: Secondary | ICD-10-CM | POA: Diagnosis not present

## 2021-07-25 DIAGNOSIS — N289 Disorder of kidney and ureter, unspecified: Secondary | ICD-10-CM | POA: Insufficient documentation

## 2021-07-25 DIAGNOSIS — C3490 Malignant neoplasm of unspecified part of unspecified bronchus or lung: Secondary | ICD-10-CM

## 2021-07-25 DIAGNOSIS — Z452 Encounter for adjustment and management of vascular access device: Secondary | ICD-10-CM | POA: Diagnosis not present

## 2021-07-25 DIAGNOSIS — C3412 Malignant neoplasm of upper lobe, left bronchus or lung: Secondary | ICD-10-CM | POA: Insufficient documentation

## 2021-07-25 DIAGNOSIS — Z79899 Other long term (current) drug therapy: Secondary | ICD-10-CM | POA: Diagnosis not present

## 2021-07-25 DIAGNOSIS — C3432 Malignant neoplasm of lower lobe, left bronchus or lung: Secondary | ICD-10-CM

## 2021-07-25 LAB — CBC WITH DIFFERENTIAL (CANCER CENTER ONLY)
Abs Immature Granulocytes: 0.03 10*3/uL (ref 0.00–0.07)
Basophils Absolute: 0.1 10*3/uL (ref 0.0–0.1)
Basophils Relative: 1 %
Eosinophils Absolute: 0.5 10*3/uL (ref 0.0–0.5)
Eosinophils Relative: 6 %
HCT: 51.1 % (ref 39.0–52.0)
Hemoglobin: 16.8 g/dL (ref 13.0–17.0)
Immature Granulocytes: 0 %
Lymphocytes Relative: 20 %
Lymphs Abs: 1.7 10*3/uL (ref 0.7–4.0)
MCH: 29 pg (ref 26.0–34.0)
MCHC: 32.9 g/dL (ref 30.0–36.0)
MCV: 88.3 fL (ref 80.0–100.0)
Monocytes Absolute: 1.1 10*3/uL — ABNORMAL HIGH (ref 0.1–1.0)
Monocytes Relative: 13 %
Neutro Abs: 5.1 10*3/uL (ref 1.7–7.7)
Neutrophils Relative %: 60 %
Platelet Count: 217 10*3/uL (ref 150–400)
RBC: 5.79 MIL/uL (ref 4.22–5.81)
RDW: 13.9 % (ref 11.5–15.5)
WBC Count: 8.4 10*3/uL (ref 4.0–10.5)
nRBC: 0 % (ref 0.0–0.2)

## 2021-07-25 LAB — CMP (CANCER CENTER ONLY)
ALT: 10 U/L (ref 0–44)
AST: 19 U/L (ref 15–41)
Albumin: 3.7 g/dL (ref 3.5–5.0)
Alkaline Phosphatase: 110 U/L (ref 38–126)
Anion gap: 5 (ref 5–15)
BUN: 15 mg/dL (ref 8–23)
CO2: 26 mmol/L (ref 22–32)
Calcium: 9.4 mg/dL (ref 8.9–10.3)
Chloride: 105 mmol/L (ref 98–111)
Creatinine: 1.67 mg/dL — ABNORMAL HIGH (ref 0.61–1.24)
GFR, Estimated: 45 mL/min — ABNORMAL LOW (ref >60)
Glucose, Bld: 90 mg/dL (ref 70–99)
Potassium: 4.1 mmol/L (ref 3.5–5.1)
Sodium: 136 mmol/L (ref 135–145)
Total Bilirubin: 0.5 mg/dL (ref 0.3–1.2)
Total Protein: 7.1 g/dL (ref 6.5–8.1)

## 2021-07-25 LAB — TSH: TSH: 2.061 u[IU]/mL (ref 0.320–4.118)

## 2021-07-26 ENCOUNTER — Telehealth: Payer: Self-pay | Admitting: Internal Medicine

## 2021-07-26 ENCOUNTER — Ambulatory Visit (HOSPITAL_COMMUNITY)
Admission: RE | Admit: 2021-07-26 | Discharge: 2021-07-26 | Disposition: A | Discharge status: Home / Self Care | Payer: Federal, State, Local not specified - PPO | Source: Ambulatory Visit | Attending: Internal Medicine | Admitting: Internal Medicine

## 2021-07-26 DIAGNOSIS — C3490 Malignant neoplasm of unspecified part of unspecified bronchus or lung: Secondary | ICD-10-CM | POA: Insufficient documentation

## 2021-07-26 DIAGNOSIS — J432 Centrilobular emphysema: Secondary | ICD-10-CM | POA: Diagnosis not present

## 2021-07-26 DIAGNOSIS — N400 Enlarged prostate without lower urinary tract symptoms: Secondary | ICD-10-CM | POA: Diagnosis not present

## 2021-07-26 DIAGNOSIS — R918 Other nonspecific abnormal finding of lung field: Secondary | ICD-10-CM | POA: Diagnosis not present

## 2021-07-26 MED ORDER — IOHEXOL 9 MG/ML PO SOLN: 500.0000 mL | ORAL | Status: AC

## 2021-07-26 MED ORDER — IOHEXOL 9 MG/ML PO SOLN
ORAL | Status: AC
  Filled 2021-07-26: qty 1000

## 2021-07-26 NOTE — Telephone Encounter (Signed)
Called patient regarding upcoming appointment, patient is notified. °

## 2021-07-27 ENCOUNTER — Inpatient Hospital Stay (HOSPITAL_BASED_OUTPATIENT_CLINIC_OR_DEPARTMENT_OTHER): Payer: Federal, State, Local not specified - PPO | Admitting: Internal Medicine

## 2021-07-27 ENCOUNTER — Other Ambulatory Visit: Payer: Self-pay

## 2021-07-27 ENCOUNTER — Encounter: Payer: Self-pay | Admitting: Internal Medicine

## 2021-07-27 VITALS — BP 130/96 | HR 99 | Temp 97.2°F | Resp 18 | Wt 155.1 lb

## 2021-07-27 DIAGNOSIS — C3412 Malignant neoplasm of upper lobe, left bronchus or lung: Secondary | ICD-10-CM | POA: Diagnosis not present

## 2021-07-27 DIAGNOSIS — C3432 Malignant neoplasm of lower lobe, left bronchus or lung: Secondary | ICD-10-CM

## 2021-07-27 DIAGNOSIS — N289 Disorder of kidney and ureter, unspecified: Secondary | ICD-10-CM | POA: Diagnosis not present

## 2021-07-27 DIAGNOSIS — C7951 Secondary malignant neoplasm of bone: Secondary | ICD-10-CM | POA: Diagnosis not present

## 2021-07-27 DIAGNOSIS — C3490 Malignant neoplasm of unspecified part of unspecified bronchus or lung: Secondary | ICD-10-CM | POA: Diagnosis not present

## 2021-07-27 DIAGNOSIS — Z79899 Other long term (current) drug therapy: Secondary | ICD-10-CM | POA: Diagnosis not present

## 2021-07-27 DIAGNOSIS — Z452 Encounter for adjustment and management of vascular access device: Secondary | ICD-10-CM | POA: Diagnosis not present

## 2021-07-27 NOTE — Progress Notes (Signed)
?  ? ?    [facility] ?[phone]   [phone] ? ?OFFICE PROGRESS NOTE ? ?[name], MD ?[address] ?[place] [address] ? ?DIAGNOSIS: Metastatic non-small cell lung cancer, adenocarcinoma initially diagnosed as stage IB (T2a, N0, M0) non-small cell lung cancer, adenocarcinoma diagnosed in December 2017.  The patient has evidence for disease recurrence in July 2019. ? ?Biomarker Findings ?Tumor Mutational Burden - TMB-Intermediate (16 Muts/Mb) ?Microsatellite status - MS-Stable ?Genomic Findings ?For a complete list of the genes assayed, please refer to the Appendix. ?KIT amplification ?KRAS G12C ?SMAD4 splice site [id number]>P ?TP53 I232F ?7 Disease relevant genes with no reportable alterations: EGFR, ALK, ?BRAF, MET, RET, ERBB2, ROS1  ? ?PDL 1 expression is 0% ? ?PRIOR THERAPY:  ?1) status post left lower lobectomy as well as wedge resection of the left upper lobe on 05/11/2016. ?2) Adjuvant systemic chemotherapy with cisplatin 75 MG/M2 and Alimta 500 MG/M2 every 3 weeks. First dose 07/03/2016. Status post 4 cycles. ?3) Systemic chemotherapy with carboplatin for AUC of 5, Alimta 500 mg/M2 and Keytruda 200 mg IV every 3 weeks.  Status post 47 cycles.  Starting from cycle #5 the patient will be treated with maintenance Alimta and Ketruda (pembrolizumab) every 3 weeks.  This treatment was discontinued secondary to disease progression. ?4) Lumakras (Sotorasib) 960 mg p.o. daily started October 29, 2020.  Status post 1 months of treatment.  This treatment was discontinued secondary to liver toxicity even after dose reduction. ? ?CURRENT THERAPY: None ? ?INTERVAL HISTORY: ?[name]. 66 y.o. male returns to the clinic today for follow-up visit.  The patient is feeling fine today with no concerning complaints.  He denied having any current chest pain, shortness of breath, cough or hemoptysis.  He has no nausea, vomiting but has few episodes of diarrhea after the oral  contrast yesterday.  He has no constipation or abdominal pain.  He denied having any fever or chills.  He has no weight loss or night sweats.  The patient has been in observation now for several months.  He had repeat CT scan of the chest, abdomen pelvis performed recently and he is here for evaluation and discussion of his scan results. ? ?MEDICAL HISTORY: ?Past Medical History:  ?Diagnosis Date  ? AAA (abdominal aortic aneurysm) ([place])   ? AAA (abdominal aortic aneurysm) without rupture ([place]) 02/04/2014  ? [facility])   ? lung  ? Encounter for antineoplastic chemotherapy 06/06/2016  ? History of hepatitis B   ? HNP (herniated nucleus pulposus), lumbar   ? L4 with radiculopathy  ? Hypertension   ? Lung cancer ([place]) 05/18/2016  ? Malignant neoplasm of lower lobe of left lung ([place]) 04/05/2016  ? Mass of left lung   ? Mitral insufficiency 04/06/2016  ? This patient will eventually need MV repair if his prognosis is good from oncology standpoint. However, his lung cancer therapy  is the priority right now. His cardiac condition won't preclude possible lung surgery.  Once his lung cancer is under control he will need a TEE to further evaluate his mitral valve anatomy and MR severity, and also have ischemic workup as tere is evidence on calcificati  ? Renal insufficiency 10/09/2016  ? S/P partial lobectomy of lung 05/11/2016  ? Varicose veins of legs   ? ? ?ALLERGIES:  is allergic to tramadol. ? ?MEDICATIONS:  ?Current Outpatient Medications  ?Medication Sig Dispense Refill  ? furosemide (LASIX) 20 MG tablet Take 1  tablet (20 mg total) by mouth 3 times weekly as needed for lower extremity swelling. 45 tablet 3  ? lisinopril (ZESTRIL) 10 MG tablet Take 1 tablet (10 mg total) by mouth daily. 90 tablet 2  ? amoxicillin-clavulanate (AUGMENTIN) 875-125 MG tablet Take 1 tablet by mouth 2 (two) times daily. (Patient not taking: Reported on 06/07/2021) 14 tablet 0  ? carvedilol (COREG CR) 10 MG 24 hr capsule Take 10 mg by mouth  daily. (Patient not taking: Reported on 06/07/2021)    ? loperamide (IMODIUM A-D) 2 MG tablet Take 2 mg by mouth 4 (four) times daily as needed for diarrhea or loose stools. (Patient not taking: Reported on 06/07/2021)    ? predniSONE (DELTASONE) 10 MG tablet Take 6 tablets (60 mg) daily for 7 days, followed by 4 tablets (40 mg daily) for 7 days, followed by 2 tablets (20 mg) daily for 7 days, followed by 1 tablet (10 mg) daily for 10 days (Patient not taking: Reported on 06/07/2021) 91 tablet 0  ? ?No current facility-administered medications for this visit.  ? ? ?SURGICAL HISTORY:  ?Past Surgical History:  ?Procedure Laterality Date  ? ABDOMINAL AORTIC ENDOVASCULAR STENT GRAFT N/A 03/12/2016  ? Procedure: ABDOMINAL AORTIC ENDOVASCULAR STENT GRAFT;  Surgeon: Conrad [employer], MD;  Location: [facility];  Service: Vascular;  Laterality: N/A;  ? COLONOSCOPY    ? INGUINAL HERNIA REPAIR Left 1975  ? Left inguinal hernia  ? INGUINAL HERNIA REPAIR Right   ? IR IMAGING GUIDED PORT INSERTION  09/04/2019  ? LOBECTOMY Left 05/11/2016  ? Procedure: LEFT LOWER LOBE LOBECTOMY AND LEFT UPPER LOBE  RESECTION AND PLACEMENT OF ON-Q;  Surgeon: [name], MD;  Location: [facility];  Service: Thoracic;  Laterality: Left;  ? LUMBAR LAMINECTOMY  October 22, 2012  ? LYMPH NODE DISSECTION Left 05/11/2016  ? Procedure: LYMPH NODE DISSECTION;  Surgeon: [name], MD;  Location: [facility];  Service: Thoracic;  Laterality: Left;  ? RIGHT/LEFT HEART CATH AND CORONARY ANGIOGRAPHY N/A 03/10/2020  ? Procedure: RIGHT/LEFT HEART CATH AND CORONARY ANGIOGRAPHY;  Surgeon: Sherren Mocha, MD;  Location: [facility];  Service: Cardiovascular;  Laterality: N/A;  ? STAPLING OF BLEBS Left 05/11/2016  ? Procedure: STAPLING OF APICAL BLEB;  Surgeon: [name], MD;  Location: [facility];  Service: Thoracic;  Laterality: Left;  ? TEE WITHOUT CARDIOVERSION N/A 10/07/2019  ? Procedure: TRANSESOPHAGEAL ECHOCARDIOGRAM (TEE);  Surgeon: Dorothy Spark, MD;   Location: [facility];  Service: Cardiovascular;  Laterality: N/A;  ? VIDEO ASSISTED THORACOSCOPY Left 05/18/2016  ? Procedure: VIDEO ASSISTED THORACOSCOPY WITH REMOVAL OF LEFT APICAL BLEB;  Surgeon: [name], MD;  Location: [facility];  Service: Thoracic;  Laterality: Left;  ? VIDEO ASSISTED THORACOSCOPY (VATS)/WEDGE RESECTION Left 05/11/2016  ? Procedure: LEFT VIDEO ASSISTED THORACOSCOPY (VATS);  Surgeon: [name], MD;  Location: [facility];  Service: Thoracic;  Laterality: Left;  ? VIDEO BRONCHOSCOPY N/A 05/11/2016  ? Procedure: VIDEO BRONCHOSCOPY, LEFT LUNG;  Surgeon: [name], MD;  Location: [facility];  Service: Thoracic;  Laterality: N/A;  ? VIDEO BRONCHOSCOPY N/A 05/18/2016  ? Procedure: VIDEO BRONCHOSCOPY WITH BRONCHIAL WASHING;  Surgeon: [name], MD;  Location: [facility];  Service: Thoracic;  Laterality: N/A;  ? ? ?REVIEW OF SYSTEMS:  Constitutional: negative ?Eyes: negative ?Ears, nose, mouth, throat, and face: negative ?Respiratory: negative ?Cardiovascular: positive for tachycardia ?Gastrointestinal: positive for diarrhea ?Genitourinary:negative ?Integument/breast: negative ?Hematologic/lymphatic: negative ?Musculoskeletal:negative ?Neurological: negative ?Behavioral/Psych: negative ?Endocrine: negative ?  Allergic/Immunologic: negative  ? ?PHYSICAL EXAMINATION: General appearance: alert, cooperative, fatigued, and no distress ?Head: Normocephalic, without obvious abnormality, atraumatic ?Neck: no adenopathy, no JVD, supple, symmetrical, trachea midline, and thyroid not enlarged, symmetric, no tenderness/mass/nodules ?Lymph nodes: Cervical, supraclavicular, and axillary nodes normal. ?Resp: clear to auscultation bilaterally ?Back: symmetric, no curvature. ROM normal. No CVA tenderness. ?Cardio: Tachycardia ?GI: soft, non-tender; bowel sounds normal; no masses,  no organomegaly ?Extremities: extremities normal, atraumatic, no cyanosis or edema ?Neurologic: Alert and oriented X 3, normal  strength and tone. Normal symmetric reflexes. Normal coordination and gait  ? ?ECOG PERFORMANCE STATUS: 1 - Symptomatic but completely ambulatory ? ?Blood pressure (!) 130/96, pulse 99, temperature (!) 97.2 ?F (36.2 ?C), t

## 2021-08-03 ENCOUNTER — Inpatient Hospital Stay: Payer: Federal, State, Local not specified - PPO

## 2021-08-04 ENCOUNTER — Inpatient Hospital Stay: Payer: Federal, State, Local not specified - PPO

## 2021-08-04 ENCOUNTER — Other Ambulatory Visit: Payer: Self-pay

## 2021-08-04 DIAGNOSIS — C7951 Secondary malignant neoplasm of bone: Secondary | ICD-10-CM | POA: Diagnosis not present

## 2021-08-04 DIAGNOSIS — Z95828 Presence of other vascular implants and grafts: Secondary | ICD-10-CM

## 2021-08-04 DIAGNOSIS — Z452 Encounter for adjustment and management of vascular access device: Secondary | ICD-10-CM | POA: Diagnosis not present

## 2021-08-04 DIAGNOSIS — N289 Disorder of kidney and ureter, unspecified: Secondary | ICD-10-CM | POA: Diagnosis not present

## 2021-08-04 DIAGNOSIS — Z79899 Other long term (current) drug therapy: Secondary | ICD-10-CM | POA: Diagnosis not present

## 2021-08-04 DIAGNOSIS — C3412 Malignant neoplasm of upper lobe, left bronchus or lung: Secondary | ICD-10-CM | POA: Diagnosis not present

## 2021-08-04 MED ORDER — SODIUM CHLORIDE 0.9% FLUSH
10.0000 mL | Freq: Once | INTRAVENOUS | Status: AC
  Administered 2021-08-04: 10 mL

## 2021-08-04 MED ORDER — HEPARIN SOD (PORK) LOCK FLUSH 100 UNIT/ML IV SOLN
500.0000 [IU] | Freq: Once | INTRAVENOUS | Status: AC
  Administered 2021-08-04: 500 [IU]

## 2021-08-08 ENCOUNTER — Telehealth: Payer: Self-pay | Admitting: Internal Medicine

## 2021-08-08 NOTE — Telephone Encounter (Signed)
Called patient regarding upcoming appointments, patient is notified. 

## 2021-08-11 ENCOUNTER — Telehealth: Payer: Self-pay

## 2021-08-11 NOTE — Telephone Encounter (Signed)
Called the patient to reschedule echo from March since he has not yet rescheduled. ?The patient states he wishes not to reschedule at this time but will schedule his visit with Dr Johney Frame in August.  ?Discussed with the patient and he agreed to have echo and visit on the same day in August. ?Echo and visit with Dr. Johney Frame scheduled 11/24/2021. ?He was grateful for call and agrees with plan. ?

## 2021-09-14 ENCOUNTER — Inpatient Hospital Stay: Payer: Federal, State, Local not specified - PPO | Attending: Internal Medicine

## 2021-09-14 ENCOUNTER — Other Ambulatory Visit: Payer: Self-pay

## 2021-09-14 DIAGNOSIS — Z452 Encounter for adjustment and management of vascular access device: Secondary | ICD-10-CM | POA: Diagnosis not present

## 2021-09-14 DIAGNOSIS — C3412 Malignant neoplasm of upper lobe, left bronchus or lung: Secondary | ICD-10-CM | POA: Insufficient documentation

## 2021-09-14 DIAGNOSIS — Z95828 Presence of other vascular implants and grafts: Secondary | ICD-10-CM

## 2021-09-14 MED ORDER — SODIUM CHLORIDE 0.9% FLUSH
10.0000 mL | Freq: Once | INTRAVENOUS | Status: AC
  Administered 2021-09-14: 10 mL

## 2021-09-14 MED ORDER — HEPARIN SOD (PORK) LOCK FLUSH 100 UNIT/ML IV SOLN
500.0000 [IU] | Freq: Once | INTRAVENOUS | Status: AC
  Administered 2021-09-14: 500 [IU]

## 2021-10-26 ENCOUNTER — Ambulatory Visit (HOSPITAL_COMMUNITY): Admission: RE | Admit: 2021-10-26 | Payer: Medicare Other | Source: Ambulatory Visit

## 2021-10-31 ENCOUNTER — Ambulatory Visit (HOSPITAL_COMMUNITY)
Admission: RE | Admit: 2021-10-31 | Discharge: 2021-10-31 | Disposition: A | Discharge status: Home / Self Care | Payer: Federal, State, Local not specified - PPO | Source: Ambulatory Visit | Attending: Internal Medicine | Admitting: Internal Medicine

## 2021-10-31 ENCOUNTER — Ambulatory Visit (HOSPITAL_COMMUNITY): Payer: Federal, State, Local not specified - PPO

## 2021-10-31 DIAGNOSIS — R911 Solitary pulmonary nodule: Secondary | ICD-10-CM | POA: Diagnosis not present

## 2021-10-31 DIAGNOSIS — C3490 Malignant neoplasm of unspecified part of unspecified bronchus or lung: Secondary | ICD-10-CM | POA: Diagnosis not present

## 2021-10-31 DIAGNOSIS — K769 Liver disease, unspecified: Secondary | ICD-10-CM | POA: Diagnosis not present

## 2021-10-31 DIAGNOSIS — J439 Emphysema, unspecified: Secondary | ICD-10-CM | POA: Diagnosis not present

## 2021-11-02 ENCOUNTER — Inpatient Hospital Stay: Payer: Federal, State, Local not specified - PPO | Attending: Internal Medicine

## 2021-11-02 ENCOUNTER — Inpatient Hospital Stay (HOSPITAL_BASED_OUTPATIENT_CLINIC_OR_DEPARTMENT_OTHER): Payer: Federal, State, Local not specified - PPO | Admitting: Internal Medicine

## 2021-11-02 ENCOUNTER — Other Ambulatory Visit: Payer: Self-pay

## 2021-11-02 VITALS — BP 130/80 | HR 92 | Temp 98.1°F | Resp 18 | Wt 157.0 lb

## 2021-11-02 DIAGNOSIS — Z79899 Other long term (current) drug therapy: Secondary | ICD-10-CM | POA: Diagnosis not present

## 2021-11-02 DIAGNOSIS — C3432 Malignant neoplasm of lower lobe, left bronchus or lung: Secondary | ICD-10-CM | POA: Diagnosis not present

## 2021-11-02 DIAGNOSIS — C7951 Secondary malignant neoplasm of bone: Secondary | ICD-10-CM | POA: Diagnosis not present

## 2021-11-02 DIAGNOSIS — C3490 Malignant neoplasm of unspecified part of unspecified bronchus or lung: Secondary | ICD-10-CM

## 2021-11-02 DIAGNOSIS — C3412 Malignant neoplasm of upper lobe, left bronchus or lung: Secondary | ICD-10-CM | POA: Insufficient documentation

## 2021-11-02 LAB — CBC WITH DIFFERENTIAL (CANCER CENTER ONLY)
Abs Immature Granulocytes: 0.03 10*3/uL (ref 0.00–0.07)
Basophils Absolute: 0.1 10*3/uL (ref 0.0–0.1)
Basophils Relative: 1 %
Eosinophils Absolute: 0.6 10*3/uL — ABNORMAL HIGH (ref 0.0–0.5)
Eosinophils Relative: 7 %
HCT: 50.4 % (ref 39.0–52.0)
Hemoglobin: 16.5 g/dL (ref 13.0–17.0)
Immature Granulocytes: 0 %
Lymphocytes Relative: 17 %
Lymphs Abs: 1.6 10*3/uL (ref 0.7–4.0)
MCH: 29.4 pg (ref 26.0–34.0)
MCHC: 32.7 g/dL (ref 30.0–36.0)
MCV: 89.8 fL (ref 80.0–100.0)
Monocytes Absolute: 1 10*3/uL (ref 0.1–1.0)
Monocytes Relative: 10 %
Neutro Abs: 6.2 10*3/uL (ref 1.7–7.7)
Neutrophils Relative %: 65 %
Platelet Count: 191 10*3/uL (ref 150–400)
RBC: 5.61 MIL/uL (ref 4.22–5.81)
RDW: 13.8 % (ref 11.5–15.5)
WBC Count: 9.4 10*3/uL (ref 4.0–10.5)
nRBC: 0 % (ref 0.0–0.2)

## 2021-11-02 LAB — CMP (CANCER CENTER ONLY)
ALT: 14 U/L (ref 0–44)
AST: 22 U/L (ref 15–41)
Albumin: 3.8 g/dL (ref 3.5–5.0)
Alkaline Phosphatase: 100 U/L (ref 38–126)
Anion gap: 5 (ref 5–15)
BUN: 26 mg/dL — ABNORMAL HIGH (ref 8–23)
CO2: 24 mmol/L (ref 22–32)
Calcium: 9.4 mg/dL (ref 8.9–10.3)
Chloride: 105 mmol/L (ref 98–111)
Creatinine: 1.81 mg/dL — ABNORMAL HIGH (ref 0.61–1.24)
GFR, Estimated: 40 mL/min — ABNORMAL LOW (ref >60)
Glucose, Bld: 74 mg/dL (ref 70–99)
Potassium: 4.4 mmol/L (ref 3.5–5.1)
Sodium: 134 mmol/L — ABNORMAL LOW (ref 135–145)
Total Bilirubin: 0.4 mg/dL (ref 0.3–1.2)
Total Protein: 7.1 g/dL (ref 6.5–8.1)

## 2021-11-02 NOTE — Progress Notes (Signed)
[place] [phone]   [phone]  OFFICE PROGRESS NOTE  [name], MD [address] [place] [address]  DIAGNOSIS: Metastatic non-small cell lung cancer, adenocarcinoma initially diagnosed as stage IB (T2a, N0, M0) non-small cell lung cancer, adenocarcinoma diagnosed in December 2017.  The patient has evidence for disease recurrence in July 2019.  Biomarker Findings Tumor Mutational Burden - TMB-Intermediate (16 Muts/Mb) Microsatellite status - MS-Stable Genomic Findings For a complete list of the genes assayed, please refer to the Appendix. KIT amplification KRAS Y24M SMAD4 splice site 2500-3B>C WU88 I232F 7 Disease relevant genes with no reportable alterations: EGFR, ALK, BRAF, MET, RET, ERBB2, ROS1   PDL 1 expression is 0%  PRIOR THERAPY:  1) status post left lower lobectomy as well as wedge resection of the left upper lobe on 05/11/2016. 2) Adjuvant systemic chemotherapy with cisplatin 75 MG/M2 and Alimta 500 MG/M2 every 3 weeks. First dose 07/03/2016. Status post 4 cycles. 3) Systemic chemotherapy with carboplatin for AUC of 5, Alimta 500 mg/M2 and Keytruda 200 mg IV every 3 weeks.  Status post 47 cycles.  Starting from cycle #5 the patient will be treated with maintenance Alimta and Ketruda (pembrolizumab) every 3 weeks.  This treatment was discontinued secondary to disease progression. 4) Lumakras (Sotorasib) 960 mg p.o. daily started October 29, 2020.  Status post 1 months of treatment.  This treatment was discontinued secondary to liver toxicity even after dose reduction.  CURRENT THERAPY: None  INTERVAL HISTORY: William Kales [name] 66 y.o. male returns to the clinic today for follow-up visit.  The patient is feeling fine today with no concerning complaints.  He has no weight loss or night sweats.  He has no nausea, vomiting, diarrhea or constipation.  He denied having any chest pain, shortness of breath, cough or  hemoptysis.  He has no fever or chills.  He has no headache or visual changes.  He is currently on observation.  He had repeat CT scan of the chest, abdomen and pelvis performed recently and he is here for evaluation and discussion of his scan results.  MEDICAL HISTORY: Past Medical History:  Diagnosis Date   AAA (abdominal aortic aneurysm) ([place])    AAA (abdominal aortic aneurysm) without rupture ([place]) 02/04/2014   Cancer ([place])    lung   Encounter for antineoplastic chemotherapy 06/06/2016   History of hepatitis B    HNP (herniated nucleus pulposus), lumbar    L4 with radiculopathy   Hypertension    Lung cancer ([place]) 05/18/2016   Malignant neoplasm of lower lobe of left lung ([place]) 04/05/2016   Mass of left lung    Mitral insufficiency 04/06/2016   This patient will eventually need MV repair if his prognosis is good from oncology standpoint. However, his lung cancer therapy  is the priority right now. His cardiac condition won't preclude possible lung surgery.  Once his lung cancer is under control he will need a TEE to further evaluate his mitral valve anatomy and MR severity, and also have ischemic workup as tere is evidence on calcificati   Renal insufficiency 10/09/2016   S/P partial lobectomy of lung 05/11/2016   Varicose veins of legs     ALLERGIES:  is allergic to tramadol.  MEDICATIONS:  Current Outpatient Medications  Medication Sig Dispense Refill   amoxicillin-clavulanate (AUGMENTIN) 875-125 MG tablet Take 1 tablet by mouth 2 (two) times daily. (Patient not taking: Reported on 06/07/2021) 14  tablet 0   carvedilol (COREG CR) 10 MG 24 hr capsule Take 10 mg by mouth daily. (Patient not taking: Reported on 06/07/2021)     furosemide (LASIX) 20 MG tablet Take 1 tablet (20 mg total) by mouth 3 times weekly as needed for lower extremity swelling. 45 tablet 3   lisinopril (ZESTRIL) 10 MG tablet Take 1 tablet (10 mg total) by mouth daily. 90 tablet 2   loperamide (IMODIUM A-D) 2 MG tablet  Take 2 mg by mouth 4 (four) times daily as needed for diarrhea or loose stools. (Patient not taking: Reported on 06/07/2021)     predniSONE (DELTASONE) 10 MG tablet Take 6 tablets (60 mg) daily for 7 days, followed by 4 tablets (40 mg daily) for 7 days, followed by 2 tablets (20 mg) daily for 7 days, followed by 1 tablet (10 mg) daily for 10 days (Patient not taking: Reported on 06/07/2021) 91 tablet 0   No current facility-administered medications for this visit.    SURGICAL HISTORY:  Past Surgical History:  Procedure Laterality Date   ABDOMINAL AORTIC ENDOVASCULAR STENT GRAFT N/A 03/12/2016   Procedure: ABDOMINAL AORTIC ENDOVASCULAR STENT GRAFT;  Surgeon: Conrad Flatwoods, MD;  Location: [facility];  Service: Vascular;  Laterality: N/A;   COLONOSCOPY     INGUINAL HERNIA REPAIR Left 1975   Left inguinal hernia   INGUINAL HERNIA REPAIR Right    IR IMAGING GUIDED PORT INSERTION  09/04/2019   LOBECTOMY Left 05/11/2016   Procedure: LEFT LOWER LOBE LOBECTOMY AND LEFT UPPER LOBE  RESECTION AND PLACEMENT OF ON-Q;  Surgeon: [name], MD;  Location: [facility];  Service: Thoracic;  Laterality: Left;   LUMBAR LAMINECTOMY  October 22, 2012   LYMPH NODE DISSECTION Left 05/11/2016   Procedure: LYMPH NODE DISSECTION;  Surgeon: [name], MD;  Location: [facility];  Service: Thoracic;  Laterality: Left;   RIGHT/LEFT HEART CATH AND CORONARY ANGIOGRAPHY N/A 03/10/2020   Procedure: RIGHT/LEFT HEART CATH AND CORONARY ANGIOGRAPHY;  Surgeon: Sherren Mocha, MD;  Location: [facility];  Service: Cardiovascular;  Laterality: N/A;   STAPLING OF BLEBS Left 05/11/2016   Procedure: STAPLING OF APICAL BLEB;  Surgeon: [name], MD;  Location: [facility];  Service: Thoracic;  Laterality: Left;   TEE WITHOUT CARDIOVERSION N/A 10/07/2019   Procedure: TRANSESOPHAGEAL ECHOCARDIOGRAM (TEE);  Surgeon: Dorothy Spark, MD;  Location: [facility] ENDOSCOPY;  Service: Cardiovascular;  Laterality: N/A;   VIDEO ASSISTED THORACOSCOPY  Left 05/18/2016   Procedure: VIDEO ASSISTED THORACOSCOPY WITH REMOVAL OF LEFT APICAL BLEB;  Surgeon: [name], MD;  Location: [facility];  Service: Thoracic;  Laterality: Left;   VIDEO ASSISTED THORACOSCOPY (VATS)/WEDGE RESECTION Left 05/11/2016   Procedure: LEFT VIDEO ASSISTED THORACOSCOPY (VATS);  Surgeon: [name], MD;  Location: [facility];  Service: Thoracic;  Laterality: Left;   VIDEO BRONCHOSCOPY N/A 05/11/2016   Procedure: VIDEO BRONCHOSCOPY, LEFT LUNG;  Surgeon: [name], MD;  Location: [facility];  Service: Thoracic;  Laterality: N/A;   VIDEO BRONCHOSCOPY N/A 05/18/2016   Procedure: VIDEO BRONCHOSCOPY WITH BRONCHIAL WASHING;  Surgeon: [name], MD;  Location: [facility];  Service: Thoracic;  Laterality: N/A;    REVIEW OF SYSTEMS:  A comprehensive review of systems was negative.   PHYSICAL EXAMINATION: General appearance: alert, cooperative, and no distress Head: Normocephalic, without obvious abnormality, atraumatic Neck: no adenopathy, no JVD, supple, symmetrical, trachea midline, and thyroid not enlarged, symmetric, no tenderness/mass/nodules Lymph nodes: Cervical, supraclavicular, and axillary nodes  normal. Resp: clear to auscultation bilaterally Back: symmetric, no curvature. ROM normal. No CVA tenderness. Cardio: Tachycardia GI: soft, non-tender; bowel sounds normal; no masses,  no organomegaly Extremities: extremities normal, atraumatic, no cyanosis or edema   ECOG PERFORMANCE STATUS: 1 - Symptomatic but completely ambulatory  Blood pressure 130/80, pulse 92, temperature 98.1 F (36.7 C), temperature source Oral, resp. rate 18, weight 157 lb (71.2 kg), SpO2 96 %.  LABORATORY DATA: Lab Results  Component Value Date   WBC 8.4 07/25/2021   HGB 16.8 07/25/2021   HCT 51.1 07/25/2021   MCV 88.3 07/25/2021   PLT 217 07/25/2021      Chemistry      Component Value Date/Time   NA 136 07/25/2021 0956   NA 135 06/14/2021 1342   NA 138 01/11/2017 1343   K 4.1  07/25/2021 0956   K 4.7 01/11/2017 1343   CL 105 07/25/2021 0956   CO2 26 07/25/2021 0956   CO2 23 01/11/2017 1343   BUN 15 07/25/2021 0956   BUN 19 06/14/2021 1342   BUN 20.2 01/11/2017 1343   CREATININE 1.67 (H) 07/25/2021 0956   CREATININE 1.6 (H) 01/11/2017 1343      Component Value Date/Time   CALCIUM 9.4 07/25/2021 0956   CALCIUM 9.3 01/11/2017 1343   ALKPHOS 110 07/25/2021 0956   ALKPHOS 89 01/11/2017 1343   AST 19 07/25/2021 0956   AST 41 (H) 01/11/2017 1343   ALT 10 07/25/2021 0956   ALT 27 01/11/2017 1343   BILITOT 0.5 07/25/2021 0956   BILITOT 0.49 01/11/2017 1343       RADIOGRAPHIC STUDIES: CT Chest Wo Contrast  Result Date: 11/01/2021 CLINICAL DATA:  Primary Cancer Type: Lung Imaging Indication: Routine surveillance Interval therapy since last imaging? No Initial Cancer Diagnosis Date: 03/29/2016; Established by: Biopsy-proven Detailed Pathology: Stage Ib non-small cell lung cancer, adenocarcinoma. Primary Tumor location:  Left lower lobe. Recurrence? Yes; Date(s) of recurrence: 12/02/2017; Established by: Biopsy-proven; metastasis to T4. Surgeries: Left lower lobectomy and left upper lobe wedge resection, with stapling of apical bleb, 05/11/2016. AAA stent graft 2017. Chemotherapy: Yes; Ongoing? No; Most recent administration: 06/30/2020 Immunotherapy?  Yes; Type: Keytruda; Ongoing? No Radiation therapy? No Other Cancer Therapies: Lumakras; discontinued 11/2020. * Tracking Code: BO * EXAM: CT CHEST, ABDOMEN AND PELVIS WITHOUT CONTRAST TECHNIQUE: Multidetector CT imaging of the chest, abdomen and pelvis was performed following the standard protocol without IV contrast. RADIATION DOSE REDUCTION: This exam was performed according to the departmental dose-optimization program which includes automated exposure control, adjustment of the mA and/or kV according to patient size and/or use of iterative reconstruction technique. COMPARISON:  Most recent CT chest, abdomen and pelvis  07/26/2021. 11/12/2017 PET-CT. FINDINGS: CT CHEST FINDINGS Cardiovascular: Port in the anterior chest wall with tip in distal SVC. Mediastinum/Nodes: No axillary or supraclavicular adenopathy. No mediastinal or hilar adenopathy. No pericardial fluid. Esophagus normal. Lungs/Pleura: Postsurgical change in the LEFT hemithorax related to LEFT lower lobectomy. No new nodularity. Small nodule in the RIGHT lower lobe measuring 4 mm (image 151/4) is unchanged. Curvilinear calcification in the RIGHT lower lobe pleural space is unchanged. No suspicious nodules on the RIGHT. Musculoskeletal: No aggressive osseous lesion. CT ABDOMEN AND PELVIS FINDINGS Hepatobiliary: Several low-density lesions liver unchanged. Lesions difficult to characterize without IV contrast but unchanged from CT 07/26/2021. Pancreas: Pancreas is normal. No ductal dilatation. No pancreatic inflammation. Spleen: Normal spleen Adrenals/urinary tract: Adrenal glands and kidneys are normal. The ureters and bladder normal. Stomach/Bowel: Stomach, small bowel, appendix, and  cecum are normal. The colon and rectosigmoid colon are normal. Vascular/Lymphatic: No vascular stent graft noted. Abdominal lymphadenopathy Reproductive: Unremarkable Other: No peritoneal metastasis Musculoskeletal: No aggressive osseous lesion IMPRESSION: Chest Impression: 1. Postsurgical change in the LEFT hemithorax without evidence of lung cancer recurrence. 2. No suspicious nodularity the RIGHT lung. 3. No evidence of lung cancer recurrence or progression. Abdomen / Pelvis Impression: 1. Stable low-density lesions in the liver favored benign. 2. No evidence of metastatic disease in the abdomen pelvis. Electronically Signed   By: Suzy Bouchard M.D.   On: 11/01/2021 11:24   CT Abdomen Pelvis Wo Contrast  Result Date: 11/01/2021 CLINICAL DATA:  Primary Cancer Type: Lung Imaging Indication: Routine surveillance Interval therapy since last imaging? No Initial Cancer Diagnosis Date:  03/29/2016; Established by: Biopsy-proven Detailed Pathology: Stage Ib non-small cell lung cancer, adenocarcinoma. Primary Tumor location:  Left lower lobe. Recurrence? Yes; Date(s) of recurrence: 12/02/2017; Established by: Biopsy-proven; metastasis to T4. Surgeries: Left lower lobectomy and left upper lobe wedge resection, with stapling of apical bleb, 05/11/2016. AAA stent graft 2017. Chemotherapy: Yes; Ongoing? No; Most recent administration: 06/30/2020 Immunotherapy?  Yes; Type: Keytruda; Ongoing? No Radiation therapy? No Other Cancer Therapies: Lumakras; discontinued 11/2020. * Tracking Code: BO * EXAM: CT CHEST, ABDOMEN AND PELVIS WITHOUT CONTRAST TECHNIQUE: Multidetector CT imaging of the chest, abdomen and pelvis was performed following the standard protocol without IV contrast. RADIATION DOSE REDUCTION: This exam was performed according to the departmental dose-optimization program which includes automated exposure control, adjustment of the mA and/or kV according to patient size and/or use of iterative reconstruction technique. COMPARISON:  Most recent CT chest, abdomen and pelvis 07/26/2021. 11/12/2017 PET-CT. FINDINGS: CT CHEST FINDINGS Cardiovascular: Port in the anterior chest wall with tip in distal SVC. Mediastinum/Nodes: No axillary or supraclavicular adenopathy. No mediastinal or hilar adenopathy. No pericardial fluid. Esophagus normal. Lungs/Pleura: Postsurgical change in the LEFT hemithorax related to LEFT lower lobectomy. No new nodularity. Small nodule in the RIGHT lower lobe measuring 4 mm (image 151/4) is unchanged. Curvilinear calcification in the RIGHT lower lobe pleural space is unchanged. No suspicious nodules on the RIGHT. Musculoskeletal: No aggressive osseous lesion. CT ABDOMEN AND PELVIS FINDINGS Hepatobiliary: Several low-density lesions liver unchanged. Lesions difficult to characterize without IV contrast but unchanged from CT 07/26/2021. Pancreas: Pancreas is normal. No ductal  dilatation. No pancreatic inflammation. Spleen: Normal spleen Adrenals/urinary tract: Adrenal glands and kidneys are normal. The ureters and bladder normal. Stomach/Bowel: Stomach, small bowel, appendix, and cecum are normal. The colon and rectosigmoid colon are normal. Vascular/Lymphatic: No vascular stent graft noted. Abdominal lymphadenopathy Reproductive: Unremarkable Other: No peritoneal metastasis Musculoskeletal: No aggressive osseous lesion IMPRESSION: Chest Impression: 1. Postsurgical change in the LEFT hemithorax without evidence of lung cancer recurrence. 2. No suspicious nodularity the RIGHT lung. 3. No evidence of lung cancer recurrence or progression. Abdomen / Pelvis Impression: 1. Stable low-density lesions in the liver favored benign. 2. No evidence of metastatic disease in the abdomen pelvis. Electronically Signed   By: Suzy Bouchard M.D.   On: 11/01/2021 11:24    ASSESSMENT AND PLAN:  This is a very pleasant 66 years old white male with a stage IB non-small cell lung cancer, adenocarcinoma status post wedge resection of the left upper lobe followed by 4 cycles of adjuvant systemic chemotherapy with cisplatin and Alimta and he tolerated his treatment well except for fatigue. The patient has been on observation since June 2018.   The recent imaging studies including CT scan of the chest as  well as a PET scan showed evidence for disease recurrence with metastatic disease presented with bilateral pulmonary nodules as well as destructive bone lesions at T4 vertebral body, biopsy proven to be metastatic adenocarcinoma. Molecular studies by foundation 1 showed KRAS G12C mutation.  PDL 1 expression was negative. The patient was treated with carboplatin, Alimta and Ketruda (pembrolizumab) status post 47 cycles.  Starting from cycle #5 he is on maintenance treatment with Alimta and Keytruda every 3 weeks. He has been on treatment with single agent Keytruda recently because of the renal  insufficiency.  The patient has been tolerating this treatment well with no concerning complaints.  This treatment was discontinued secondary to disease progression. He started second line treatment with Lumakras (Sotorasib) 960 mg p.o. daily on October 29, 2020. Unfortunately the patient had significant liver dysfunction secondary to the treatment with Lumakras (Sotorasib).  Status post 1 months.  He was treated with a tapered dose of prednisone for liver toxicity.  We resumed his treatment with reduced dose Lumakras (Sotorasib) at 480 mg p.o. daily but again the patient developed significant liver dysfunction within few days of resuming the treatment.  His treatment was discontinued and the patient was treated with a tapered dose of prednisone. He has been in observation for around 10 months now with no concerning issues. He had repeat CT scan of the chest, abdomen pelvis performed recently.  I personally and independently reviewed the scans and discussed the result with the patient today. His scan showed no concerning findings for disease progression. I recommended for him to continue on observation with repeat CT scan of the chest, abdomen and pelvis in 3 months. The patient will continue to have Port-A-Cath flush every 6 weeks. He was advised to call immediately if he has any other concerning symptoms in the interval.  The patient voices understanding of current disease status and treatment options and is in agreement with the current care plan. All questions were answered. The patient knows to call the clinic with any problems, questions or concerns. We can certainly see the patient much sooner if necessary. Disclaimer: This note was dictated with voice recognition software. Similar sounding words can inadvertently be transcribed and may be missed upon review. Eilleen Kempf, MD 11/02/21

## 2021-11-19 NOTE — Progress Notes (Signed)
Cardiology Office Note:    Date:  11/24/2021   ID:  [name] [name], DOB 03-28-1956, MRN [id number]  PCP:  [name], MD   [facility] Providers Cardiologist:  Sherren Mocha, MD {   Referring MD: [name], MD    History of Present Illness:    Nyair Depaulo. is a 66 y.o. male with a hx of AAA status post stent graft in 2017, severe MR, metastatic adenocarcinoma of the lung on chemotherapy who was previously followed by Dr. Meda Coffee who now returns to clinic for follow-up.  Patient states that he was first noted to have a heart murmur on physical exam when he was being worked up for primary lung cancer in November 2017 after CT imaging revealed suspicious mass in the left lung following stent graft repair of abdominal aortic aneurysm.  Echocardiogram performed at that time revealed normal left ventricular function with prolapse involving the anterior leaflet of the mitral valve and what was felt to be moderate mitral regurgitation.  The patient underwent uncomplicated left lower lobectomy with wedge resection of the left upper lobe by Dr. 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 The patient has been followed closely ever since by Dr. Earlie Server.  Follow-up CT and PET imaging performed in 2019 revealed disease recurrence with bilateral lung nodules and hypermetabolic destructive lesion in the T4 vertebral body consistent with bony involvement.  The patient has remained on long-term chemotherapy ever since (carboplatin for AUC of 5, Alimta 500 mg/M2 and Keytruda 200 mg IV) and follow-up CT imaging has revealed no progression of disease. He then developed progressive exertional shortness of breath. Follow-up transthoracic echocardiogram 07/2019 revealed normal left ventricular function with severe mitral regurgitation and signs of elevated right heart pressures. TEE October 07, 2019 confirmed the presence of severe mitral  regurgitation.  The jet of regurgitation was posterior with overriding prolapse of the anterior leaflet.  There was reversal of flow in the pulmonary veins with PISA radius measured 1.0 cm, ERO 0.45 cm, and regurgitant volume estimated 58 mL. He underwent cardiac surgical evaluation with Dr. Roxy Manns 01/07/2020 and then referred to Dr. Burt Knack for consideration of transcatheter edge-to-edge mitral valve repair as a less invasive treatment option in the context of his lung cancer. He underwent RHC/LHC that showed low LVEDP, nonobstructive CAD and normal right-sided pressures.  Because patient symptoms improved in the meantime it was decided that we will continue surveillance of his symptoms and follow-up echocardiograms.  Saw [name] in 09/2020 where he was overall doing okay. Was not very active at the time. Repeat TTE 12/2020 with LVEF 60-65%, severe basal septal hypertrophy, normal RV, moderate PHTN with PASP 55mmHg, trivial AI, ascending aorta 52mm, moderate-to-severe MR.  Was last seen on clinic 05/2021 where he had stopped his BP medications but was otherwise doing well. We re-started him on lisinopril 10mg  daily.  Today, the patient overall feels well today. No chest pain, SOB, orthopnea, PND. Has occasional LE edema which resolves with compression socks. Energy is improved since stopping the chemo. No lightheadedness or syncope. Blood pressures mainly 130s at home. He states he does not want to start any new medications today.   Past Medical History:  Diagnosis Date   AAA (abdominal aortic aneurysm) ([place])    AAA (abdominal aortic aneurysm) without rupture ([place]) 02/04/2014   Cancer ([place])    lung   Encounter for antineoplastic chemotherapy 06/06/2016   History of hepatitis B  HNP (herniated nucleus pulposus), lumbar    L4 with radiculopathy   Hypertension    Lung cancer ([place]) 05/18/2016   Malignant neoplasm of lower lobe of left lung ([place]) 04/05/2016   Mass of left lung    Mitral  insufficiency 04/06/2016   This patient will eventually need MV repair if his prognosis is good from oncology standpoint. However, his lung cancer therapy  is the priority right now. His cardiac condition won't preclude possible lung surgery.  Once his lung cancer is under control he will need a TEE to further evaluate his mitral valve anatomy and MR severity, and also have ischemic workup as tere is evidence on calcificati   Renal insufficiency 10/09/2016   S/P partial lobectomy of lung 05/11/2016   Varicose veins of legs     Past Surgical History:  Procedure Laterality Date   ABDOMINAL AORTIC ENDOVASCULAR STENT GRAFT N/A 03/12/2016   Procedure: ABDOMINAL AORTIC ENDOVASCULAR STENT GRAFT;  Surgeon: [place], MD;  Location: [facility];  Service: Vascular;  Laterality: N/A;   COLONOSCOPY     INGUINAL HERNIA REPAIR Left 1975   Left inguinal hernia   INGUINAL HERNIA REPAIR Right    IR IMAGING GUIDED PORT INSERTION  09/04/2019   LOBECTOMY Left 05/11/2016   Procedure: LEFT LOWER LOBE LOBECTOMY AND LEFT UPPER LOBE  RESECTION AND PLACEMENT OF ON-Q;  Surgeon: [name], MD;  Location: [place];  Service: Thoracic;  Laterality: Left;   LUMBAR LAMINECTOMY  October 22, 2012   LYMPH NODE DISSECTION Left 05/11/2016   Procedure: LYMPH NODE DISSECTION;  Surgeon: [name], MD;  Location: [place];  Service: Thoracic;  Laterality: Left;   RIGHT/LEFT HEART CATH AND CORONARY ANGIOGRAPHY N/A 03/10/2020   Procedure: RIGHT/LEFT HEART CATH AND CORONARY ANGIOGRAPHY;  Surgeon: Sherren Mocha, MD;  Location: [place] [facility];  Service: Cardiovascular;  Laterality: N/A;   STAPLING OF BLEBS Left 05/11/2016   Procedure: STAPLING OF APICAL BLEB;  Surgeon: [name], MD;  Location: [place];  Service: Thoracic;  Laterality: Left;   TEE WITHOUT CARDIOVERSION N/A 10/07/2019   Procedure: TRANSESOPHAGEAL ECHOCARDIOGRAM (TEE);  Surgeon: Dorothy [facility], MD;  Location: [facility];  Service: Cardiovascular;   Laterality: N/A;   VIDEO ASSISTED THORACOSCOPY Left 05/18/2016   Procedure: VIDEO ASSISTED THORACOSCOPY WITH REMOVAL OF LEFT APICAL BLEB;  Surgeon: [name], MD;  Location: [place];  Service: Thoracic;  Laterality: Left;   VIDEO ASSISTED THORACOSCOPY (VATS)/WEDGE RESECTION Left 05/11/2016   Procedure: LEFT VIDEO ASSISTED THORACOSCOPY (VATS);  Surgeon: [name], MD;  Location: [place];  Service: Thoracic;  Laterality: Left;   VIDEO BRONCHOSCOPY N/A 05/11/2016   Procedure: VIDEO BRONCHOSCOPY, LEFT LUNG;  Surgeon: [name], MD;  Location: [place];  Service: Thoracic;  Laterality: N/A;   VIDEO BRONCHOSCOPY N/A 05/18/2016   Procedure: VIDEO BRONCHOSCOPY WITH BRONCHIAL WASHING;  Surgeon: [name], MD;  Location: [facility];  Service: Thoracic;  Laterality: N/A;    Current Medications: Current Meds  Medication Sig   lisinopril (ZESTRIL) 10 MG tablet Take 1 tablet (10 mg total) by mouth daily.     Allergies:   Tramadol   Social History   Socioeconomic History   Marital status: Married    Spouse name: Not on file   Number of children: Not on file   Years of education: Not on file   Highest education level: Not on file  Occupational History   Not on file  Tobacco Use  Smoking status: Former    Packs/day: 1.00    Types: Cigarettes    Quit date: 03/05/2016    Years since quitting: 5.7   Smokeless tobacco: Never  Vaping Use   Vaping Use: Never used  Substance and Sexual Activity   Alcohol use: Yes    Alcohol/week: 6.0 standard drinks of alcohol    Types: 6 Cans of beer per week   Drug use: Yes    Types: Cocaine, Heroin    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  ROS:   Review of Systems  Constitutional:  Negative for chills and fever.  HENT:  Negative for nosebleeds and tinnitus.   Eyes:  Negative for blurred vision and pain.  Respiratory:  Negative for hemoptysis and sputum production.   Cardiovascular:  Positive for leg swelling. Negative for chest pain, palpitations, orthopnea, claudication and PND.  Gastrointestinal:  Negative for diarrhea, nausea and vomiting.  Genitourinary:  Negative for dysuria and frequency.  Musculoskeletal:  Negative for myalgias and neck pain.  Neurological:  Negative for dizziness and seizures.  Endo/Heme/Allergies:  Does not bruise/bleed easily.  Psychiatric/Behavioral:  Negative for depression and suicidal ideas.      EKGs/Labs/Other Studies Reviewed:    The following studies were reviewed today:  CT Chest 05/29/2021: COMPARISON:  03/01/2021, 02/02/2021, 07/19/2020   FINDINGS: CT CHEST FINDINGS   Cardiovascular: Right chest port catheter. Aortic atherosclerosis. 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 Normal heart size. Left coronary artery calcifications. No pericardial effusion.   Mediastinum/Nodes: No enlarged mediastinal, hilar, or axillary lymph nodes. 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 Thyroid gland, trachea, and esophagus demonstrate no significant findings.   Lungs/Pleura: Moderate centrilobular and paraseptal emphysema. Mild, diffuse bilateral bronchial wall thickening. Status post left lower lobectomy and wedge resection of the posterior left upper lobe. Unchanged small pulmonary nodules of the right lung, including a 0.5 cm nodule of the  anterior right upper lobe (series 6, image 86) and a 0.5 cm nodule of the dependent right lower lobe (series 6, image 151). Calcifications of the dependent right pleura. No pleural effusion or pneumothorax.   Musculoskeletal: No chest wall mass. Unchanged, expansile, mildly sclerotic lesion of the spinous process of T4 (series 2, image 20).   CT ABDOMEN PELVIS FINDINGS   Hepatobiliary: There is a hypodense lesion adjacent to the left portal vein in the left lobe of the liver, although poorly characterized on this noncontrast CT appears to have slightly enlarged over time, now measuring approximately 2.5 x 1.2 cm (series 2, image 71). Multiple subcentimeter low-attenuation lesions of the liver are otherwise unchanged (series 2, image 64, 65, 67, 77). No gallstones or gallbladder wall thickening. Mild intra and extrahepatic biliary ductal dilatation, common bile duct measuring up to 0.9 cm, unchanged compared to multiple prior examinations and of doubtful significance.  Pancreas: Unremarkable. No pancreatic ductal dilatation or surrounding inflammatory changes.   Spleen: Normal in size without significant abnormality.   Adrenals/Urinary Tract: Adrenal glands are unremarkable. Kidneys are normal, without renal calculi, solid lesion, or hydronephrosis. Bladder is unremarkable.   Stomach/Bowel: Stomach is within normal limits. Appendix appears normal. No evidence of bowel wall thickening, distention, or inflammatory changes.   Vascular/Lymphatic: Aortic atherosclerosis. Status post aortobiiliac stent endograft repair. No enlarged abdominal or pelvic lymph nodes.   Reproductive: Prostatomegaly.   Other: No abdominal wall hernia or abnormality. No ascites.   Musculoskeletal: No acute osseous findings.   IMPRESSION: 1. Status post left lower lobectomy and wedge resection of the posterior left upper lobe. 2. Unchanged small pulmonary nodules of the right lung. Attention  on follow-up. 3. Unchanged, expansile, mildly sclerotic lesion of the spinous process of T4, which remains of uncertain significance. Attention on follow-up. 4. There is a hypodense lesion adjacent to the left portal vein in the left lobe of the liver, although poorly characterized on this noncontrast CT appears to have slightly enlarged over time, now measuring approximately 2.5 x 1.2 cm. This is modestly suspicious for metastasis. Consider multiphasic contrast enhanced MR to further characterize. Otherwise attention on follow-up. 5. Multiple subcentimeter low-attenuation lesions of the liver are otherwise unchanged, which are incompletely characterized by noncontrast CT. Attention on follow-up. 6. Status post aortobiiliac stent endograft repair. 7. Emphysema. 8. 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 Recommend annual imaging followup by CTA or MRA. This recommendation follows 2010 ACCF/AHA/AATS/ACR/ASA/SCA/SCAI/SIR/STS/SVM Guidelines for the Diagnosis and Management of Patients with Thoracic Aortic Disease. Circulation. 2010; 121: U440-H474. Aortic aneurysm NOS (ICD10-I71.9)   Aortic Atherosclerosis (ICD10-I70.0) and Emphysema (ICD10-J43.9).  Echo 12/29/2020:  1. Left ventricular ejection fraction, by estimation, is 60 to 65%. The  left ventricle has normal function. The left ventricle has no regional  wall motion abnormalities. There is severe asymmetric left ventricular  hypertrophy of the basal-septal segment. Left ventricular diastolic parameters are indeterminate.   2. Right ventricular systolic function is normal. The right ventricular  size is normal. There is moderately elevated pulmonary artery systolic  pressure. The estimated right ventricular systolic pressure is 25.9 mmHg.   3. The aortic valve is normal in structure. Aortic valve regurgitation is  trivial. No aortic stenosis is present.   4. Aortic dilatation  noted. There is dilatation of the aortic root,  measuring 45 mm. There is dilatation of the ascending aorta, measuring 42 mm.   5. The mitral valve is myxomatous. Moderate to severe mitral valve  regurgitation. Significant thickening and prolapse of anterior leaflet  causing eccentric posterior directed MR jet. Difficult to quantify MR  given eccentric jet, but no LA/LV dilatation and E-wave velocity less than 1.2 m/s argues against severe MR. Would consider TEE or cardiac MRI to quantify MR severity   Right/Left Heart Cath 03/10/2020: 1. Patent coronary arteries with nonobstructive CAD, primarily affecting the mid-RCA. Widely patent LAD and LCx 2. Normal right heart pressures, including normal PCWP 3. Normal LVEDP   Recommend: further review with multidisciplinary heart team, considering transcatheter edge to edge mitral valve repair versus ongoing observation/med rx   TEE 10/07/2019   1. There is 4+ primary mitral regurgitation with myxomatous anterior  mitral valve leaflet, with prolapse of all three scallops. The jet is  posteriorly directed, eccentric with reversal of pulmonary vein forward  flow bilateraly. PISA radius 1.0 cm, ERO  0.45 cm2, regurgitation volume 58 ml.  Posterior leaflet measures 1.0 cm.  Flail gap 0.3 cm.   2. Left ventricular ejection fraction, by estimation, is 60 to 65%. The  left ventricle has normal function. The left ventricle has no regional  wall motion abnormalities.   3. Right ventricular systolic function is normal. The right ventricular  size is normal. There is normal pulmonary artery systolic pressure.   4. Left atrial size was moderately dilated. No left atrial/left atrial  appendage thrombus was detected. The LAA emptying velocity was 40 cm/s.   5. Right atrial size was mildly dilated.   6. The mitral valve is myxomatous. No evidence of mitral valve  regurgitation. No evidence of mitral stenosis.   7. Tricuspid valve regurgitation is moderate.    8. The aortic valve is normal in structure. Aortic valve regurgitation is  trivial. No aortic stenosis is present.   9. There is moderate dilatation at the level of the sinuses of Valsalva  measuring 48 mm. There is mild (Grade II) plaque.   TTE: 08/18/2019    1. Left ventricular ejection fraction, by estimation, is 60 to 65%. The  left ventricle has normal function. The left ventricle has no regional  wall motion abnormalities. There is mild left ventricular hypertrophy.  Left ventricular diastolic parameters  are consistent with Grade I diastolic dysfunction (impaired relaxation).   2. Right ventricular systolic function is normal. The right ventricular  size is normal. There is severely elevated pulmonary artery systolic  pressure. The estimated right ventricular systolic pressure is 09.3 mmHg.   3. Left atrial size was moderately dilated.   4. The mitral valve is myxomatous. Severe mitral valve regurgitation.   5. The aortic valve is tricuspid. Aortic valve regurgitation is not  visualized.   6. Aortic dilatation noted. Aneurysm of the ascending aorta, measuring 40  mm. There is moderate to severe dilatation at the level of the sinuses of  Valsalva measuring 49 mm.   EKG:  EKG is personally reviewed. 06/07/2021: Sinus rhythm. Rate 87 bpm. First degree AV block.  Nonspecific ST changes.   Recent Labs: 07/25/2021: TSH 2.061 11/02/2021: ALT 14; BUN 26; Creatinine 1.81; Hemoglobin 16.5; Platelet Count 191; Potassium 4.4; Sodium 134   Recent Lipid Panel No results found for: "CHOL", "TRIG", "HDL", "CHOLHDL", "VLDL", "LDLCALC", "LDLDIRECT"   Risk Assessment/Calculations:           Physical Exam:    VS:  BP (!) 142/80   Pulse 71   Ht 6\' 3"  (1.905 m)   Wt 157 lb (71.2 kg)   SpO2 98%   BMI 19.62 kg/m     Wt Readings from Last 3 Encounters:  11/24/21 157 lb (71.2 kg)  11/02/21 157 lb (71.2 kg)  07/27/21 155 lb 1 oz (70.3 kg)     GEN: Thin, comfortable HEENT:  Normal NECK: No JVD; No carotid bruits CARDIAC: RRR, 8-1/8 systolic murmur heard throughout the precordium. No rubs, gallops RESPIRATORY:  Diminished but clear ABDOMEN: Soft, non-tender, non-distended MUSCULOSKELETAL:  Trace bilateral LE edema SKIN: Warm and dry NEUROLOGIC:  Alert and oriented x 3 PSYCHIATRIC:  Normal affect   ASSESSMENT:    1. Severe mitral regurgitation   2. Mitral valve prolapse   3. Essential hypertension   4. Malignant neoplasm of lower lobe of left lung ([place])   5. Aneurysm of ascending aorta without rupture ([facility])   6. Abdominal aortic aneurysm (AAA) without rupture, unspecified part ([place])   7. Coronary artery disease involving native coronary artery of native heart without  angina pectoris    PLAN:    In order of problems listed above:  #Mitral Valve Prolapse with Severe MR: TEE 06/21 anterior MV prolapse with severe MR with PISA radius 1.0cm, EROA 0.45, regurgitant volume estimated 58 mL. He underwent cardiac surgical evaluation with Dr. Roxy Manns 01/07/2020 and then referred to Dr. Burt Knack for consideration of transcatheter edge-to-edge mitral valve repair as a less invasive treatment option in the context of his lung cancer. He underwent RHC/LHC that showed low LVEDP, nonobstructive CAD and normal right-sided pressures.  Because his symptoms had improved and he has significant comorbidites, the decision was made to continue serial TTEs for monitoring. Last TTE 12/2020 with EF 60-65%, moderate-to-severe MR. E wave <1.2 and no significant LA/LV dilation so may be more moderate. Currently symptoms are stable to improving.  -Follow-up TTE today; will continue serial monitoring -Continue lisinopril 10mg  daily for afterload reduction -Off lasix and managing swelling with compression socks  #Metastatic Lung Cancer: S/p wedge resection with 4 cycles of adjuvant systemic chemotherapy with cisplatin and Alimta. Had recurrence in 2019 and initiated on carboplatin, alimta, and  [place]. He is status post 47 cycles. He had been on maintenance Alimta and Keytruda every 3 weeks. His Alimta was discontinued to to renal insufficiency and Beryle Flock was stopped due to disease progression. He is now being monitored with serial CT imaging.  #HTN: Stable at 130s. Does not want to increasing dosing -Continue lisinopril 10mg  daily  #Mild Coronary Artery Disease: Declines statin therapy.  #Ascending aortic aneurysm: CT chest with contrast with stable dilated aorta measuring 4.0cm on CTA chest.  -Serial monitoring with TTE -Manage HTN as above  #AAA s/p stenting: -Declines statin and aspirin -Repeat ultrasound for monitoring  #CKDIIIB: -Monitor        Follow-up in 6 months.  Medication Adjustments/Labs and Tests Ordered: Current medicines are reviewed at length with the patient today.  Concerns regarding medicines are outlined above.   Orders Placed This Encounter  Procedures   ECHOCARDIOGRAM COMPLETE   VAS [place] AAA DUPLEX   No orders of the defined types were placed in this encounter.  Patient Instructions  Medication Instructions:  Your physician recommends that you continue on your current medications as directed. Please refer to the Current Medication list given to you today.  *If you need a refill on your cardiac medications before your next appointment, please call your pharmacy*   Testing/Procedures: Your physician has requested that you have an abdominal aorta duplex. During this test, an ultrasound is used to evaluate the aorta. Allow 30 minutes for this exam. Do not eat after midnight the day before and avoid carbonated beverages.  Your physician has requested that you have an echocardiogram in 6 months. Echocardiography is a painless test that uses sound waves to create images of your heart. It provides your doctor with information about the size and shape of your heart and how well your heart's chambers and valves are working. This procedure takes  approximately one hour. There are no restrictions for this procedure.   Follow-Up: At [facility], you and your health needs are our priority.  As part of our continuing mission to provide you with exceptional heart care, we have created designated Provider Care Teams.  These Care Teams include your primary Cardiologist (physician) and Advanced Practice Providers (APPs -  Physician Assistants and Nurse Practitioners) who all work together to provide you with the care you need, when you need it.   Your next appointment:   6 month(s)  The format for your next appointment:   In Person  Provider:   Gwyndolyn Kaufman, MD     Important Information About Sugar          Signed, Freada Bergeron, MD  11/24/2021 10:17 AM    [place]

## 2021-11-24 ENCOUNTER — Ambulatory Visit (HOSPITAL_COMMUNITY): Payer: Federal, State, Local not specified - PPO | Attending: Cardiology

## 2021-11-24 ENCOUNTER — Ambulatory Visit (INDEPENDENT_AMBULATORY_CARE_PROVIDER_SITE_OTHER): Payer: Federal, State, Local not specified - PPO | Admitting: Cardiology

## 2021-11-24 ENCOUNTER — Encounter: Payer: Self-pay | Admitting: Cardiology

## 2021-11-24 VITALS — BP 142/80 | HR 71 | Ht 75.0 in | Wt 157.0 lb

## 2021-11-24 DIAGNOSIS — I341 Nonrheumatic mitral (valve) prolapse: Secondary | ICD-10-CM

## 2021-11-24 DIAGNOSIS — I340 Nonrheumatic mitral (valve) insufficiency: Secondary | ICD-10-CM

## 2021-11-24 DIAGNOSIS — I2510 Atherosclerotic heart disease of native coronary artery without angina pectoris: Secondary | ICD-10-CM

## 2021-11-24 DIAGNOSIS — I7121 Aneurysm of the ascending aorta, without rupture: Secondary | ICD-10-CM

## 2021-11-24 DIAGNOSIS — C3432 Malignant neoplasm of lower lobe, left bronchus or lung: Secondary | ICD-10-CM

## 2021-11-24 DIAGNOSIS — I10 Essential (primary) hypertension: Secondary | ICD-10-CM

## 2021-11-24 DIAGNOSIS — I714 Abdominal aortic aneurysm, without rupture, unspecified: Secondary | ICD-10-CM

## 2021-11-24 LAB — ECHOCARDIOGRAM COMPLETE
Area-P 1/2: 3.08 cm2
MV M vel: 5.42 m/s
MV Peak grad: 117.5 mmHg
Radius: 1.2 cm
S' Lateral: 2.9 cm

## 2021-11-24 NOTE — Patient Instructions (Signed)
Medication Instructions:  Your physician recommends that you continue on your current medications as directed. Please refer to the Current Medication list given to you today.  *If you need a refill on your cardiac medications before your next appointment, please call your pharmacy*   Testing/Procedures: Your physician has requested that you have an abdominal aorta duplex. During this test, an ultrasound is used to evaluate the aorta. Allow 30 minutes for this exam. Do not eat after midnight the day before and avoid carbonated beverages.  Your physician has requested that you have an echocardiogram in 6 months. Echocardiography is a painless test that uses sound waves to create images of your heart. It provides your doctor with information about the size and shape of your heart and how well your heart's chambers and valves are working. This procedure takes approximately one hour. There are no restrictions for this procedure.   Follow-Up: At [facility], you and your health needs are our priority.  As part of our continuing mission to provide you with exceptional heart care, we have created designated Provider Care Teams.  These Care Teams include your primary Cardiologist (physician) and Advanced Practice Providers (APPs -  Physician Assistants and Nurse Practitioners) who all work together to provide you with the care you need, when you need it.   Your next appointment:   6 month(s)  The format for your next appointment:   In Person  Provider:   Gwyndolyn Kaufman, MD     Important Information About Sugar

## 2021-11-27 ENCOUNTER — Other Ambulatory Visit (HOSPITAL_COMMUNITY): Payer: Medicare Other

## 2021-12-14 ENCOUNTER — Inpatient Hospital Stay: Payer: Federal, State, Local not specified - PPO | Attending: Internal Medicine

## 2021-12-14 DIAGNOSIS — Z452 Encounter for adjustment and management of vascular access device: Secondary | ICD-10-CM | POA: Insufficient documentation

## 2021-12-14 DIAGNOSIS — Z85828 Personal history of other malignant neoplasm of skin: Secondary | ICD-10-CM | POA: Insufficient documentation

## 2021-12-14 DIAGNOSIS — Z95828 Presence of other vascular implants and grafts: Secondary | ICD-10-CM

## 2021-12-14 MED ORDER — SODIUM CHLORIDE 0.9% FLUSH
10.0000 mL | Freq: Once | INTRAVENOUS | Status: AC
  Administered 2021-12-14: 10 mL

## 2021-12-14 MED ORDER — HEPARIN SOD (PORK) LOCK FLUSH 100 UNIT/ML IV SOLN
500.0000 [IU] | Freq: Once | INTRAVENOUS | Status: AC
  Administered 2021-12-14: 500 [IU]

## 2021-12-19 NOTE — Progress Notes (Unsigned)
HISTORY AND PHYSICAL     CC:  follow up. For EVAR Requesting Provider:  [name], MD  HPI: This is a 66 y.o. male who is here today for follow up for AAA and is s/p EVAR on 03/12/2016 by Dr. Bridgett Larsson for AAA >6cm.  Pt was last seen 06/30/2019 and at that time, he was not having any new abdominal or back pain.  He was undergoing chemotherapy for stage IV lung cancer.    The pt returns today for follow up studies.   The pt *** on a statin for cholesterol management.    The pt *** on an aspirin.    Other AC:  *** The pt is on ACEI for hypertension.  The pt does *** have diabetes. Tobacco hx:  former  Pt does *** have family hx of AAA.  Past Medical History:  Diagnosis Date   AAA (abdominal aortic aneurysm) ([place])    AAA (abdominal aortic aneurysm) without rupture ([place]) 02/04/2014   Cancer ([place])    lung   Encounter for antineoplastic chemotherapy 06/06/2016   History of hepatitis B    HNP (herniated nucleus pulposus), lumbar    L4 with radiculopathy   Hypertension    Lung cancer ([place]) 05/18/2016   Malignant neoplasm of lower lobe of left lung ([place]) 04/05/2016   Mass of left lung    Mitral insufficiency 04/06/2016   This patient will eventually need MV repair if his prognosis is good from oncology standpoint. However, his lung cancer therapy  is the priority right now. His cardiac condition won't preclude possible lung surgery.  Once his lung cancer is under control he will need a TEE to further evaluate his mitral valve anatomy and MR severity, and also have ischemic workup as tere is evidence on calcificati   Renal insufficiency 10/09/2016   S/P partial lobectomy of lung 05/11/2016   Varicose veins of legs     Past Surgical History:  Procedure Laterality Date   ABDOMINAL AORTIC ENDOVASCULAR STENT GRAFT N/A 03/12/2016   Procedure: ABDOMINAL AORTIC ENDOVASCULAR STENT GRAFT;  Surgeon: Conrad Magnet, MD;  Location: [facility];  Service: Vascular;  Laterality: N/A;   COLONOSCOPY      INGUINAL HERNIA REPAIR Left 1975   Left inguinal hernia   INGUINAL HERNIA REPAIR Right    IR IMAGING GUIDED PORT INSERTION  09/04/2019   LOBECTOMY Left 05/11/2016   Procedure: LEFT LOWER LOBE LOBECTOMY AND LEFT UPPER LOBE  RESECTION AND PLACEMENT OF ON-Q;  Surgeon: [name], MD;  Location: [facility];  Service: Thoracic;  Laterality: Left;   LUMBAR LAMINECTOMY  October 22, 2012   LYMPH NODE DISSECTION Left 05/11/2016   Procedure: LYMPH NODE DISSECTION;  Surgeon: [name], MD;  Location: [facility];  Service: Thoracic;  Laterality: Left;   RIGHT/LEFT HEART CATH AND CORONARY ANGIOGRAPHY N/A 03/10/2020   Procedure: RIGHT/LEFT HEART CATH AND CORONARY ANGIOGRAPHY;  Surgeon: Sherren Mocha, MD;  Location: [facility];  Service: Cardiovascular;  Laterality: N/A;   STAPLING OF BLEBS Left 05/11/2016   Procedure: STAPLING OF APICAL BLEB;  Surgeon: [name], MD;  Location: [facility];  Service: Thoracic;  Laterality: Left;   TEE WITHOUT CARDIOVERSION N/A 10/07/2019   Procedure: TRANSESOPHAGEAL ECHOCARDIOGRAM (TEE);  Surgeon: Dorothy Spark, MD;  [facility];  Service: Cardiovascular;  Laterality: N/A;   VIDEO ASSISTED THORACOSCOPY Left 05/18/2016   Procedure: VIDEO ASSISTED THORACOSCOPY WITH REMOVAL OF LEFT APICAL BLEB;  Surgeon: [name], MD;  Location: [facility];  Service: Thoracic;  Laterality: Left;   VIDEO ASSISTED THORACOSCOPY (VATS)/WEDGE RESECTION Left 05/11/2016   Procedure: LEFT VIDEO ASSISTED THORACOSCOPY (VATS);  Surgeon: [name], MD;  Location: [facility];  Service: Thoracic;  Laterality: Left;   VIDEO BRONCHOSCOPY N/A 05/11/2016   Procedure: VIDEO BRONCHOSCOPY, LEFT LUNG;  Surgeon: [name], MD;  Location: [facility];  Service: Thoracic;  Laterality: N/A;   VIDEO BRONCHOSCOPY N/A 05/18/2016   Procedure: VIDEO BRONCHOSCOPY WITH BRONCHIAL WASHING;  Surgeon: [name], MD;  Location: [facility];  Service: Thoracic;  Laterality: N/A;    Allergies   Allergen Reactions   Tramadol Nausea And Vomiting    Current Outpatient Medications  Medication Sig Dispense Refill   lisinopril (ZESTRIL) 10 MG tablet Take 1 tablet (10 mg total) by mouth daily. 90 tablet 2   No current facility-administered medications for this visit.    Family History  Problem Relation Age of Onset   Diabetes Mother     Social History   Socioeconomic History   Marital status: Married    Spouse name: Not on file   Number of children: Not on file   Years of education: Not on file   Highest education level: Not on file  Occupational History   Not on file  Tobacco Use   Smoking status: Former    Packs/day: 1.00    Types: Cigarettes    Quit date: 03/05/2016    Years since quitting: 5.7   Smokeless tobacco: Never  Vaping Use   Vaping Use: Never used  Substance and Sexual Activity   Alcohol use: Yes    Alcohol/week: 6.0 standard drinks of alcohol    Types: 6 Cans of beer per week   Drug use: Yes    Types: Cocaine, Heroin    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   Sexual activity: Not on file  Other Topics Concern   Not on file  Social History Narrative   Not on file   Social Determinants of Health   Financial Resource Strain: Not on file  Food Insecurity: Not on file  Transportation Needs: Not on file  Physical Activity: Not on file  Stress: Not on file  Social Connections: Not on file  Intimate Partner Violence: Not on file     REVIEW OF SYSTEMS:  *** [X]  denotes positive finding, [ ]  denotes negative finding Cardiac  Comments:  Chest pain or chest pressure:    Shortness of breath upon exertion:    Short of breath when lying flat:    Irregular heart rhythm:        Vascular    Pain in calf, thigh, or hip brought on by ambulation:    Pain in feet at night that wakes you up from your sleep:     Blood clot in your veins:    Leg swelling:         Pulmonary    Oxygen at home:    Productive cough:     Wheezing:         Neurologic     Sudden weakness in arms or legs:     Sudden numbness in arms or legs:     Sudden onset of difficulty speaking or slurred speech:    Temporary loss of vision in one eye:     Problems with dizziness:         Gastrointestinal    Blood in stool:     Vomited blood:  Genitourinary    Burning when urinating:     Blood in urine:        Psychiatric    Major depression:         Hematologic    Bleeding problems:    Problems with blood clotting too easily:        Skin    Rashes or ulcers:        Constitutional    Fever or chills:      PHYSICAL EXAMINATION:  ***  General:  WDWN in NAD; vital signs documented above Gait: Not observed HENT: WNL, normocephalic Pulmonary: normal non-labored breathing , without wheezing Cardiac: {Desc; regular/irreg:14544} HR,  {With/Without:20273} carotid bruit*** Abdomen: soft, NT, no masses; aortic pulse is *** palpable Skin: {With/Without:20273} rashes Vascular Exam/Pulses:  Right Left  Radial {Exam; arterial pulse strength 0-4:30167} {Exam; arterial pulse strength 0-4:30167}  Femoral {Exam; arterial pulse strength 0-4:30167} {Exam; arterial pulse strength 0-4:30167}  Popliteal {Exam; arterial pulse strength 0-4:30167} {Exam; arterial pulse strength 0-4:30167}  DP {Exam; arterial pulse strength 0-4:30167} {Exam; arterial pulse strength 0-4:30167}  PT {Exam; arterial pulse strength 0-4:30167} {Exam; arterial pulse strength 0-4:30167}   Extremities: {With/Without:20273} ischemic changes, {With/Without:20273} Gangrene , {With/Without:20273} cellulitis; {With/Without:20273} open wounds Musculoskeletal: no muscle wasting or atrophy  Neurologic: A&O X 3;  No focal weakness or paresthesias are detected Psychiatric:  The pt has {Desc; normal/abnormal:11317::"Normal"} affect.   Non-Invasive Vascular Imaging:   EVAR Arterial duplex on 12/21/2021: ***  Previous EVAR arterial duplex on 06/30/2019: Abdominal Aorta Findings:   +--------+-------+----------+----------+--------+--------+--------+  LocationAP (cm)Trans (cm)PSV (cm/s)WaveformThrombusComments  +--------+-------+----------+----------+--------+--------+--------+  Proximal3.35   3.26                                          +--------+-------+----------+----------+--------+--------+--------+   Endovascular Aortic Repair (EVAR):  +----------+----------------+-------------------+-------------------+            Diameter AP (cm)Diameter Trans (cm)Velocities (cm/sec)  +----------+----------------+-------------------+-------------------+  Aorta     3.50            3.41               77                   +----------+----------------+-------------------+-------------------+  Right Limb1.82            1.88               103                  +----------+----------------+-------------------+-------------------+  Left Limb 1.42            1.37               96                   +----------+----------------+-------------------+-------------------+  Summary:  Abdominal Aorta: Patent endovascular aneurysm repair with no evidence of endoleak.   ASSESSMENT/PLAN:: 66 y.o. male here with hx of EVAR on 03/12/2016 by Dr. Bridgett Larsson for AAA >6cm.  -*** -continue *** -pt will f/u in *** with ***.   Leontine Locket, [facility] [phone]  Clinic MD:   Scot Dock

## 2021-12-21 ENCOUNTER — Ambulatory Visit (INDEPENDENT_AMBULATORY_CARE_PROVIDER_SITE_OTHER): Payer: Federal, State, Local not specified - PPO | Admitting: Physician Assistant

## 2021-12-21 ENCOUNTER — Other Ambulatory Visit: Payer: Self-pay | Admitting: Cardiology

## 2021-12-21 ENCOUNTER — Ambulatory Visit (HOSPITAL_COMMUNITY)
Admission: RE | Admit: 2021-12-21 | Discharge: 2021-12-21 | Disposition: A | Discharge status: Home / Self Care | Payer: Federal, State, Local not specified - PPO | Source: Ambulatory Visit | Attending: Vascular Surgery | Admitting: Vascular Surgery

## 2021-12-21 VITALS — BP 139/74 | HR 75 | Temp 97.7°F | Resp 20 | Ht 75.0 in | Wt 157.1 lb

## 2021-12-21 DIAGNOSIS — Z8679 Personal history of other diseases of the circulatory system: Secondary | ICD-10-CM | POA: Diagnosis not present

## 2021-12-21 DIAGNOSIS — I2510 Atherosclerotic heart disease of native coronary artery without angina pectoris: Secondary | ICD-10-CM

## 2021-12-21 DIAGNOSIS — Z9889 Other specified postprocedural states: Secondary | ICD-10-CM | POA: Diagnosis not present

## 2021-12-21 DIAGNOSIS — I340 Nonrheumatic mitral (valve) insufficiency: Secondary | ICD-10-CM | POA: Diagnosis not present

## 2022-01-10 ENCOUNTER — Other Ambulatory Visit: Payer: Self-pay

## 2022-01-10 DIAGNOSIS — I341 Nonrheumatic mitral (valve) prolapse: Secondary | ICD-10-CM

## 2022-01-10 DIAGNOSIS — I10 Essential (primary) hypertension: Secondary | ICD-10-CM

## 2022-01-10 DIAGNOSIS — I340 Nonrheumatic mitral (valve) insufficiency: Secondary | ICD-10-CM

## 2022-01-10 DIAGNOSIS — Z79899 Other long term (current) drug therapy: Secondary | ICD-10-CM

## 2022-01-10 MED ORDER — LISINOPRIL 10 MG PO TABS: 10.0000 mg | ORAL_TABLET | Freq: Every day | ORAL | 3 refills | Status: DC

## 2022-01-10 NOTE — Telephone Encounter (Signed)
Pt's medication was sent to pt's pharmacy as requested. Confirmation received.  °

## 2022-01-25 ENCOUNTER — Inpatient Hospital Stay: Payer: Federal, State, Local not specified - PPO | Attending: Internal Medicine

## 2022-01-25 ENCOUNTER — Other Ambulatory Visit: Payer: Self-pay

## 2022-01-25 DIAGNOSIS — Z452 Encounter for adjustment and management of vascular access device: Secondary | ICD-10-CM | POA: Insufficient documentation

## 2022-01-25 DIAGNOSIS — C3412 Malignant neoplasm of upper lobe, left bronchus or lung: Secondary | ICD-10-CM | POA: Insufficient documentation

## 2022-01-25 DIAGNOSIS — C7951 Secondary malignant neoplasm of bone: Secondary | ICD-10-CM | POA: Insufficient documentation

## 2022-01-25 DIAGNOSIS — Z95828 Presence of other vascular implants and grafts: Secondary | ICD-10-CM

## 2022-01-25 MED ORDER — SODIUM CHLORIDE 0.9% FLUSH
10.0000 mL | Freq: Once | INTRAVENOUS | Status: AC
  Administered 2022-01-25: 10 mL

## 2022-01-25 MED ORDER — HEPARIN SOD (PORK) LOCK FLUSH 100 UNIT/ML IV SOLN
500.0000 [IU] | Freq: Once | INTRAVENOUS | Status: AC
  Administered 2022-01-25: 500 [IU]

## 2022-02-05 ENCOUNTER — Encounter (HOSPITAL_COMMUNITY): Payer: Self-pay

## 2022-02-05 ENCOUNTER — Ambulatory Visit (HOSPITAL_COMMUNITY)
Admission: RE | Admit: 2022-02-05 | Discharge: 2022-02-05 | Disposition: A | Discharge status: Home / Self Care | Payer: Federal, State, Local not specified - PPO | Source: Ambulatory Visit | Attending: Internal Medicine | Admitting: Internal Medicine

## 2022-02-05 DIAGNOSIS — K769 Liver disease, unspecified: Secondary | ICD-10-CM | POA: Diagnosis not present

## 2022-02-05 DIAGNOSIS — C3490 Malignant neoplasm of unspecified part of unspecified bronchus or lung: Secondary | ICD-10-CM | POA: Diagnosis not present

## 2022-02-05 DIAGNOSIS — J432 Centrilobular emphysema: Secondary | ICD-10-CM | POA: Diagnosis not present

## 2022-02-05 DIAGNOSIS — R918 Other nonspecific abnormal finding of lung field: Secondary | ICD-10-CM | POA: Diagnosis not present

## 2022-02-05 DIAGNOSIS — J90 Pleural effusion, not elsewhere classified: Secondary | ICD-10-CM | POA: Diagnosis not present

## 2022-02-05 DIAGNOSIS — K3189 Other diseases of stomach and duodenum: Secondary | ICD-10-CM | POA: Diagnosis not present

## 2022-02-05 MED ORDER — IOHEXOL 9 MG/ML PO SOLN
ORAL | Status: AC
  Filled 2022-02-05: qty 500

## 2022-02-06 NOTE — Progress Notes (Signed)
[facility] OFFICE PROGRESS NOTE  [name], [address] [place] [address]  DIAGNOSIS:  Metastatic non-small cell lung cancer, adenocarcinoma initially diagnosed as stage IB (T2a, N0, M0) non-small cell lung cancer, adenocarcinoma diagnosed in December 2017.  The patient has evidence for disease recurrence in July 2019.   Biomarker Findings Tumor Mutational Burden - TMB-Intermediate (16 Muts/Mb) Microsatellite status - MS-Stable Genomic Findings For a complete list of the genes assayed, please refer to the Appendix. KIT amplification KRAS G38V SMAD4 splice site [id number] JJ88 I232F 7 Disease relevant genes with no reportable alterations: EGFR, ALK, BRAF, MET, RET, ERBB2, ROS1    PDL 1 expression is 0%  PRIOR THERAPY: 1) status post left lower lobectomy as well as wedge resection of the left upper lobe on 05/11/2016. 2) Adjuvant systemic chemotherapy with cisplatin 75 MG/M2 and Alimta 500 MG/M2 every 3 weeks. First dose 07/03/2016. Status post 4 cycles. 3) Systemic chemotherapy with carboplatin for AUC of 5, Alimta 500 mg/M2 and Keytruda 200 mg IV every 3 weeks.  Status post 47 cycles.  Starting from cycle #5 the patient will be treated with maintenance Alimta and Ketruda (pembrolizumab) every 3 weeks.  This treatment was discontinued secondary to disease progression. 4) Lumakras (Sotorasib) 960 mg p.o. daily started October 29, 2020.  Status post 1 months of treatment.  This treatment was discontinued secondary to liver toxicity even after dose reduction.  CURRENT THERAPY: None  INTERVAL HISTORY: William Kales [name] 66 y.o. male returns to the clinic today for a follow up visit. The patient was last seen by Dr. Julien Nordmann on 11/02/21.  The patient previously was undergoing targeted treatment in July 2022 but had liver toxicity secondary to treatment.  The patient has been on observation for over a year.  He is doing well without any concerning complaints today. He  denies any recent fever, chills, night sweats, or unexplained weight loss.  Denies any chest pain, shortness of breath, cough, or hemoptysis.  Denies any nausea, vomiting, or constipation.  Reports a small amount of diarrhea after his CT scan oral contrast but denies recurrent diarrhea. Denies any headache or visual changes.   The patient recently had a restaging CT scan performed.  The patient is here today for evaluation and to review his scan results   MEDICAL HISTORY: Past Medical History:  Diagnosis Date   AAA (abdominal aortic aneurysm) ([facility])    AAA (abdominal aortic aneurysm) without rupture ([facility]) 02/04/2014   Cancer ([facility] [facility])    lung   Encounter for antineoplastic chemotherapy 06/06/2016   History of hepatitis B    HNP (herniated nucleus pulposus), lumbar    L4 with radiculopathy   Hypertension    Lung cancer ([facility]) 05/18/2016   Malignant neoplasm of lower lobe of left lung ([facility]) 04/05/2016   Mass of left lung    Mitral insufficiency 04/06/2016   This patient will eventually need MV repair if his prognosis is good from oncology standpoint. However, his lung cancer therapy  is the priority right now. His cardiac condition won't preclude possible lung surgery.  Once his lung cancer is under control he will need a TEE to further evaluate his mitral valve anatomy and MR severity, and also have ischemic workup as tere is evidence on calcificati   Renal insufficiency 10/09/2016   S/P partial lobectomy of lung 05/11/2016   Varicose veins of legs     ALLERGIES:  is allergic to tramadol.  MEDICATIONS:  Current Outpatient  Medications  Medication Sig Dispense Refill   lisinopril (ZESTRIL) 10 MG tablet Take 1 tablet (10 mg total) by mouth daily. 90 tablet 3   No current facility-administered medications for this visit.    SURGICAL HISTORY:  Past Surgical History:  Procedure Laterality Date   ABDOMINAL AORTIC ENDOVASCULAR STENT GRAFT N/A 03/12/2016   Procedure: ABDOMINAL AORTIC ENDOVASCULAR  STENT GRAFT;  Surgeon: Conrad San Martin, MD;  Location: [facility];  Service: Vascular;  Laterality: N/A;   COLONOSCOPY     INGUINAL HERNIA REPAIR Left 1975   Left inguinal hernia   INGUINAL HERNIA REPAIR Right    IR IMAGING GUIDED PORT INSERTION  09/04/2019   LOBECTOMY Left 05/11/2016   Procedure: LEFT LOWER LOBE LOBECTOMY AND LEFT UPPER LOBE  RESECTION AND PLACEMENT OF ON-Q;  Surgeon: [name], MD;  Location: [facility];  Service: Thoracic;  Laterality: Left;   LUMBAR LAMINECTOMY  October 22, 2012   LYMPH NODE DISSECTION Left 05/11/2016   Procedure: LYMPH NODE DISSECTION;  Surgeon: [name], MD;  Location: [facility];  Service: Thoracic;  Laterality: Left;   RIGHT/LEFT HEART CATH AND CORONARY ANGIOGRAPHY N/A 03/10/2020   Procedure: RIGHT/LEFT HEART CATH AND CORONARY ANGIOGRAPHY;  Surgeon: Sherren Mocha, MD;  Location: [facility];  Service: Cardiovascular;  Laterality: N/A;   STAPLING OF BLEBS Left 05/11/2016   Procedure: STAPLING OF APICAL BLEB;  Surgeon: [name], MD;  Location: [facility];  Service: Thoracic;  Laterality: Left;   TEE WITHOUT CARDIOVERSION N/A 10/07/2019   Procedure: TRANSESOPHAGEAL ECHOCARDIOGRAM (TEE);  Surgeon: Dorothy Spark, MD;  Location: [facility] ENDOSCOPY;  Service: Cardiovascular;  Laterality: N/A;   VIDEO ASSISTED THORACOSCOPY Left 05/18/2016   Procedure: VIDEO ASSISTED THORACOSCOPY WITH REMOVAL OF LEFT APICAL BLEB;  Surgeon: [name], MD;  Location: [facility];  Service: Thoracic;  Laterality: Left;   VIDEO ASSISTED THORACOSCOPY (VATS)/WEDGE RESECTION Left 05/11/2016   Procedure: LEFT VIDEO ASSISTED THORACOSCOPY (VATS);  Surgeon: [name], MD;  Location: [facility];  Service: Thoracic;  Laterality: Left;   VIDEO BRONCHOSCOPY N/A 05/11/2016   Procedure: VIDEO BRONCHOSCOPY, LEFT LUNG;  Surgeon: [name], MD;  Location: [facility];  Service: Thoracic;  Laterality: N/A;   VIDEO BRONCHOSCOPY N/A 05/18/2016   Procedure: VIDEO BRONCHOSCOPY WITH BRONCHIAL  WASHING;  Surgeon: [name], MD;  Location: [facility];  Service: Thoracic;  Laterality: N/A;    REVIEW OF SYSTEMS:   Review of Systems  Constitutional: Negative for appetite change, chills, fatigue, fever and unexpected weight change.  HENT: Negative for mouth sores, nosebleeds, sore throat and trouble swallowing.   Eyes: Negative for eye problems and icterus.  Respiratory: Negative for cough, hemoptysis, shortness of breath and wheezing.   Cardiovascular: Negative for chest pain and leg swelling.  Gastrointestinal: Negative for abdominal pain, constipation, diarrhea, nausea and vomiting.  Genitourinary: Negative for bladder incontinence, difficulty urinating, dysuria, frequency and hematuria.   Musculoskeletal: Negative for back pain, gait problem, neck pain and neck stiffness.  Skin: Negative for itching and rash.  Neurological: Negative for dizziness, extremity weakness, gait problem, headaches, light-headedness and seizures.  Hematological: Negative for adenopathy. Does not bruise/bleed easily.  Psychiatric/Behavioral: Negative for confusion, depression and sleep disturbance. The patient is not nervous/anxious.     PHYSICAL EXAMINATION:  Blood pressure (!) 142/83, pulse 95, temperature 98.1 F (36.7 C), temperature source Oral, resp. rate 16, height _0  (1.905 m), weight 159 lb 11.2 oz (72.4 kg), SpO2 98 %.  ECOG PERFORMANCE  STATUS: 0-1  Physical Exam  Constitutional: Oriented to person, place, and time and thin appearing male, and in no distress.  HENT:  Head: Normocephalic and atraumatic.  Mouth/Throat: Oropharynx is clear and moist. No oropharyngeal exudate.  Eyes: Conjunctivae are normal. Right eye exhibits no discharge. Left eye exhibits no discharge. No scleral icterus.  Neck: Normal range of motion. Neck supple.  Cardiovascular: Normal rate, regular rhythm. Murmur  Pulmonary/Chest: Effort normal and breath sounds normal. No respiratory distress. No wheezes. No  rales.  Abdominal: Soft. Exhibits no distension. There is no tenderness.  Musculoskeletal: Normal range of motion. Exhibits no edema.  Lymphadenopathy:    No cervical adenopathy.  Neurological: Alert and oriented to person, place, and time. Exhibits normal muscle tone. Gait normal. Coordination normal.  Skin: Skin is warm and dry. No rash noted. Not diaphoretic. No erythema. No pallor.  Psychiatric: Mood, memory and judgment normal.  Vitals reviewed.  LABORATORY DATA: Lab Results  Component Value Date   WBC 11.4 (H) 02/08/2022   HGB 16.9 02/08/2022   HCT 50.1 02/08/2022   MCV 91.8 02/08/2022   PLT 257 02/08/2022      Chemistry      Component Value Date/Time   NA 135 02/08/2022 1417   NA 135 06/14/2021 1342   NA 138 01/11/2017 1343   K 4.4 02/08/2022 1417   K 4.7 01/11/2017 1343   CL 106 02/08/2022 1417   CO2 21 (L) 02/08/2022 1417   CO2 23 01/11/2017 1343   BUN 20 02/08/2022 1417   BUN 19 06/14/2021 1342   BUN 20.2 01/11/2017 1343   CREATININE 1.67 (H) 02/08/2022 1417   CREATININE 1.6 (H) 01/11/2017 1343      Component Value Date/Time   CALCIUM 9.4 02/08/2022 1417   CALCIUM 9.3 01/11/2017 1343   ALKPHOS 94 02/08/2022 1417   ALKPHOS 89 01/11/2017 1343   AST 23 02/08/2022 1417   AST 41 (H) 01/11/2017 1343   ALT 14 02/08/2022 1417   ALT 27 01/11/2017 1343   BILITOT 0.6 02/08/2022 1417   BILITOT 0.49 01/11/2017 1343       RADIOGRAPHIC STUDIES:  CT Chest Wo Contrast  Result Date: 02/06/2022 CLINICAL DATA:  Routine surveillance Interval therapy since last imaging? No Initial Cancer Diagnosis Date: 03/29/2016; Established by: Biopsy-proven Detailed Pathology: Stage Ib non-small cell lung cancer, adenocarcinoma. Primary Tumor location: Left lower lobe. Recurrence? Yes; Date(s) of recurrence: 12/02/2017; Established by: Biopsy-proven; metastasis to T4. Surgeries: Left lower lobectomy and left upper lobe wedge resection, with stapling of apical bleb, 05/11/2016. AAA  stent graft 2017. Chemotherapy: Yes; Ongoing? No; Most recent administration: 06/30/2020 Immunotherapy? Yes; Type: Keytruda; Ongoing? No Radiation therapy? No Other Cancer Therapies: Lumakras; discontinued 11/2020. * Tracking Code: BO * EXAM: CT CHEST, ABDOMEN AND PELVIS WITHOUT CONTRAST TECHNIQUE: Multidetector CT imaging of the chest, abdomen and pelvis was performed following the standard protocol without IV contrast. RADIATION DOSE REDUCTION: This exam was performed according to the departmental dose-optimization program which includes automated exposure control, adjustment of the mA and/or kV according to patient size and/or use of iterative reconstruction technique. COMPARISON:  Multiple priors including most recent CT chest abdomen and pelvis dated October 31, 2021. FINDINGS: CT CHEST FINDINGS Cardiovascular: Right chest wall Port-A-Cath with tip in the SVC. Aortic atherosclerosis. 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 Mild cardiac enlargement. Trace pericardial effusion similar prior. Coronary artery calcifications. Mediastinum/Nodes: No supraclavicular adenopathy. Stable 13 mm nodule in the left lobe of the  thyroid gland on image 9/2. Not clinically significant; no follow-up imaging recommended (ref: J Am Coll Radiol. 2015 Feb;12(2): 143-50).No pathologically enlarged mediastinal, hilar or axillary lymph nodes, noting limited sensitivity for the detection of hilar adenopathy on this noncontrast study. Tiny hiatal hernia. Lungs/Pleura: Stable surgical changes of left lower lobectomy. Stable surgical changes left upper lobe wedge resection. Stable small scattered pulmonary nodules including the previously referenced in the right lower lobe measuring 4 mm on image 145/4 and 4 mm anterior right upper lobe pulmonary nodule on image 81/4. No new suspicious pulmonary nodules or masses. Severe paraseptal and centrilobular emphysematous change. Calcified right pleural plaque is  stable likely reflecting prior hemothorax or empyema. Musculoskeletal: No aggressive lytic or blastic lesion of bone. CT ABDOMEN PELVIS FINDINGS Hepatobiliary: Stable tiny bilobar hypodense hepatic lesions, difficult to accurately characterize without intravenous contrast material but stable over numerous prior studies and favored benign. Gallbladder is unremarkable. No biliary ductal dilation. Pancreas: No pancreatic ductal dilation or evidence of acute inflammation. Spleen: No splenomegaly. Adrenals/Urinary Tract: Bilateral adrenal glands appear normal. No hydronephrosis, nephrolithiasis or contour deforming renal mass. Urinary bladder is unremarkable for degree of distension. Stomach/Bowel: Radiopaque enteric contrast material traverses the rectum. Stomach is minimally distended limiting evaluation. No pathologic dilation of small or large bowel. The appendix and terminal ileum appear normal. No evidence of acute bowel inflammation. Vascular/Lymphatic: Aorto bi-iliac vascular stent graft. Aortic atherosclerosis. No pathologically enlarged abdominal or pelvic lymph nodes. Reproductive: Enlarged prostate gland. Other: No significant abdominopelvic free fluid. Musculoskeletal: Diffuse demineralization of bone. No aggressive lytic or blastic lesion of bone. IMPRESSION: 1. Stable surgical changes in the left hemithorax. No evidence of local recurrence. 2. No convincing evidence of metastatic disease in the chest, abdomen or pelvis. 3. Stable hypodense bilobar hepatic lesions, favored benign. 4. 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 Recommend semi-annual imaging followup by CTA or MRA and referral to cardiothoracic surgery if not already obtained. This recommendation follows 2010 ACCF/AHA/AATS/ACR/ASA/SCA/SCAI/SIR/STS/SVM Guidelines for the Diagnosis and Management of Patients With Thoracic Aortic Disease. Circulation. 2010; 121: R678-L38. Aortic aneurysm NOS (ICD10-I71.9) 5. Aortic  Atherosclerosis (ICD10-I70.0) and Emphysema (ICD10-J43.9). Electronically Signed   By: Dahlia Bailiff M.D.   On: 02/06/2022 11:16   CT Abdomen Pelvis Wo Contrast  Result Date: 02/06/2022 CLINICAL DATA:  Routine surveillance Interval therapy since last imaging? No Initial Cancer Diagnosis Date: 03/29/2016; Established by: Biopsy-proven Detailed Pathology: Stage Ib non-small cell lung cancer, adenocarcinoma. Primary Tumor location: Left lower lobe. Recurrence? Yes; Date(s) of recurrence: 12/02/2017; Established by: Biopsy-proven; metastasis to T4. Surgeries: Left lower lobectomy and left upper lobe wedge resection, with stapling of apical bleb, 05/11/2016. AAA stent graft 2017. Chemotherapy: Yes; Ongoing? No; Most recent administration: 06/30/2020 Immunotherapy? Yes; Type: Keytruda; Ongoing? No Radiation therapy? No Other Cancer Therapies: Lumakras; discontinued 11/2020. * Tracking Code: BO * EXAM: CT CHEST, ABDOMEN AND PELVIS WITHOUT CONTRAST TECHNIQUE: Multidetector CT imaging of the chest, abdomen and pelvis was performed following the standard protocol without IV contrast. RADIATION DOSE REDUCTION: This exam was performed according to the departmental dose-optimization program which includes automated exposure control, adjustment of the mA and/or kV according to patient size and/or use of iterative reconstruction technique. COMPARISON:  Multiple priors including most recent CT chest abdomen and pelvis dated October 31, 2021. FINDINGS: CT CHEST FINDINGS Cardiovascular: Right chest wall Port-A-Cath with tip in the SVC. Aortic atherosclerosis. 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 Mild cardiac enlargement. Trace pericardial effusion similar  prior. Coronary artery calcifications. Mediastinum/Nodes: No supraclavicular adenopathy. Stable 13 mm nodule in the left lobe of the thyroid gland on image 9/2. Not clinically significant; no follow-up imaging recommended (ref: J Am Coll  Radiol. 2015 Feb;12(2): 143-50).No pathologically enlarged mediastinal, hilar or axillary lymph nodes, noting limited sensitivity for the detection of hilar adenopathy on this noncontrast study. Tiny hiatal hernia. Lungs/Pleura: Stable surgical changes of left lower lobectomy. Stable surgical changes left upper lobe wedge resection. Stable small scattered pulmonary nodules including the previously referenced in the right lower lobe measuring 4 mm on image 145/4 and 4 mm anterior right upper lobe pulmonary nodule on image 81/4. No new suspicious pulmonary nodules or masses. Severe paraseptal and centrilobular emphysematous change. Calcified right pleural plaque is stable likely reflecting prior hemothorax or empyema. Musculoskeletal: No aggressive lytic or blastic lesion of bone. CT ABDOMEN PELVIS FINDINGS Hepatobiliary: Stable tiny bilobar hypodense hepatic lesions, difficult to accurately characterize without intravenous contrast material but stable over numerous prior studies and favored benign. Gallbladder is unremarkable. No biliary ductal dilation. Pancreas: No pancreatic ductal dilation or evidence of acute inflammation. Spleen: No splenomegaly. Adrenals/Urinary Tract: Bilateral adrenal glands appear normal. No hydronephrosis, nephrolithiasis or contour deforming renal mass. Urinary bladder is unremarkable for degree of distension. Stomach/Bowel: Radiopaque enteric contrast material traverses the rectum. Stomach is minimally distended limiting evaluation. No pathologic dilation of small or large bowel. The appendix and terminal ileum appear normal. No evidence of acute bowel inflammation. Vascular/Lymphatic: Aorto bi-iliac vascular stent graft. Aortic atherosclerosis. No pathologically enlarged abdominal or pelvic lymph nodes. Reproductive: Enlarged prostate gland. Other: No significant abdominopelvic free fluid. Musculoskeletal: Diffuse demineralization of bone. No aggressive lytic or blastic lesion of bone.  IMPRESSION: 1. Stable surgical changes in the left hemithorax. No evidence of local recurrence. 2. No convincing evidence of metastatic disease in the chest, abdomen or pelvis. 3. Stable hypodense bilobar hepatic lesions, favored benign. 4. 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 Recommend semi-annual imaging followup by CTA or MRA and referral to cardiothoracic surgery if not already obtained. This recommendation follows 2010 ACCF/AHA/AATS/ACR/ASA/SCA/SCAI/SIR/STS/SVM Guidelines for the Diagnosis and Management of Patients With Thoracic Aortic Disease. Circulation. 2010; 121: F093-A35. Aortic aneurysm NOS (ICD10-I71.9) 5. Aortic Atherosclerosis (ICD10-I70.0) and Emphysema (ICD10-J43.9). Electronically Signed   By: Dahlia Bailiff M.D.   On: 02/06/2022 11:16     ASSESSMENT/PLAN:  This is a very pleasant 66 year old male with a history of stage Ib non-small cell lung cancer, adenocarcinoma status post wedge resection of the left upper lobe followed by 4 cycles of adjuvant systemic chemotherapy with cisplatin Alimta.    He had evidence of disease recurrence with bilateral pulmonary nodules as well as a destructive bone lesion at the T4 vertebral body which was biopsy-proven to be metastatic adenocarcinoma.  He then underwent treatment with carboplatin, Alimta, Keytruda status post 47 cycles.  Starting from cycle #5, the patient was on maintenance Alimta Keytruda every 3 weeks.  He developed some renal insufficiency and was on single agent Keytruda.  Unfortunately, he had evidence of disease progression and then started on second line targeted treatment with Lumakras 960 mg p.o. daily on 10/29/20.  He developed liver toxicity treated with high-dose prednisone taper.  He then resumed treatment at a lower dose 480 mg p.o. daily but then developed significant liver dysfunction.  Treatment was discontinued due to intolerance.  The patient has been on observation for over a year and feeling  well without any concerning complaints.   The patient was  seen with Dr. Julien Nordmann today.  The patient recently had a restaging CT scan performed.  Dr. Julien Nordmann personally and independently reviewed the scan and discussed the results with the patient today.  The scan did not show any evidence of disease progression.  Dr. Julien Nordmann recommends that the patient continue on observation with a restaging CT scan in 3 months.  We will see him back for follow-up at that time to review his scan results.  We will arrange for a port flush every 6-8 weeks.   The patient was advised to call immediately if he has any concerning symptoms in the interval. The patient voices understanding of current disease status and treatment options and is in agreement with the current care plan. All questions were answered. The patient knows to call the clinic with any problems, questions or concerns. We can certainly see the patient much sooner if necessary      Orders Placed This Encounter  Procedures   CT Chest Wo Contrast    Standing Status:   Future    Standing Expiration Date:   02/08/2023    Order Specific Question:   Preferred imaging location?    Answer:   [facility]   CT Abdomen Pelvis Wo Contrast    Standing Status:   Future    Standing Expiration Date:   02/08/2023    Order Specific Question:   Preferred imaging location?    Answer:   [facility]    Order Specific Question:   Is Oral Contrast requested for this exam?    Answer:   Yes, Per Radiology protocol   CBC with Differential ([facility] Only)    Standing Status:   Future    Standing Expiration Date:   02/09/2023   CMP ([facility] only)    Standing Status:   Future    Standing Expiration Date:   02/09/2023      Tobe Sos Braylee Bosher, PA-C 02/08/22  ADDENDUM: Hematology/Oncology Attending: I had a face-to-face encounter with the patient today.  I reviewed his record, lab, scan and recommended his care plan.   This is a very pleasant 66 years old African-American male with metastatic non-small cell lung cancer, adenocarcinoma that was initially diagnosed as stage Ib status post left upper lobectomy followed by 4 cycles of adjuvant systemic chemotherapy but the patient had evidence for disease recurrence with bilateral pulmonary nodules and destructive bone metastasis.  He underwent induction systemic chemotherapy with carboplatin, Alimta and Keytruda for 4 cycles followed by maintenance treatment with Alimta and Keytruda then single agent Keytruda for a total of 47 cycles discontinued secondary to disease progression.  The patient was tried on second line treatment with Lumakras (Sotorasib) but unfortunately had significant liver dysfunction and has been in observation since that time.  He is feeling fine today with no concerning complaints. He had repeat CT scan of the chest, abdomen and pelvis performed recently.  I personally and independently reviewed the scan and discussed the result with the patient today. His scan showed no concerning findings for disease progression. I recommended for him to continue on observation. He will come back for follow-up visit in 3 months with repeat CT scan of the chest, abdomen and pelvis for restaging of his disease. The patient was advised to call immediately if he has any other concerning symptoms in the interval. The total time spent in the appointment was 30 minutes. Disclaimer: This note was dictated with voice recognition software. Similar sounding words can inadvertently be  transcribed and may be missed upon review. Eilleen Kempf, MD

## 2022-02-08 ENCOUNTER — Inpatient Hospital Stay (HOSPITAL_BASED_OUTPATIENT_CLINIC_OR_DEPARTMENT_OTHER): Payer: Federal, State, Local not specified - PPO | Admitting: Physician Assistant

## 2022-02-08 ENCOUNTER — Inpatient Hospital Stay: Payer: Federal, State, Local not specified - PPO

## 2022-02-08 ENCOUNTER — Other Ambulatory Visit: Payer: Self-pay

## 2022-02-08 VITALS — BP 142/83 | HR 95 | Temp 98.1°F | Resp 16 | Ht 75.0 in | Wt 159.7 lb

## 2022-02-08 DIAGNOSIS — C3492 Malignant neoplasm of unspecified part of left bronchus or lung: Secondary | ICD-10-CM | POA: Diagnosis not present

## 2022-02-08 DIAGNOSIS — Z95828 Presence of other vascular implants and grafts: Secondary | ICD-10-CM

## 2022-02-08 DIAGNOSIS — Z7189 Other specified counseling: Secondary | ICD-10-CM | POA: Diagnosis not present

## 2022-02-08 DIAGNOSIS — C3490 Malignant neoplasm of unspecified part of unspecified bronchus or lung: Secondary | ICD-10-CM

## 2022-02-08 DIAGNOSIS — C3412 Malignant neoplasm of upper lobe, left bronchus or lung: Secondary | ICD-10-CM | POA: Diagnosis not present

## 2022-02-08 DIAGNOSIS — Z452 Encounter for adjustment and management of vascular access device: Secondary | ICD-10-CM | POA: Diagnosis not present

## 2022-02-08 DIAGNOSIS — C7951 Secondary malignant neoplasm of bone: Secondary | ICD-10-CM | POA: Diagnosis not present

## 2022-02-08 LAB — CMP (CANCER CENTER ONLY)
ALT: 14 U/L (ref 0–44)
AST: 23 U/L (ref 15–41)
Albumin: 4 g/dL (ref 3.5–5.0)
Alkaline Phosphatase: 94 U/L (ref 38–126)
Anion gap: 8 (ref 5–15)
BUN: 20 mg/dL (ref 8–23)
CO2: 21 mmol/L — ABNORMAL LOW (ref 22–32)
Calcium: 9.4 mg/dL (ref 8.9–10.3)
Chloride: 106 mmol/L (ref 98–111)
Creatinine: 1.67 mg/dL — ABNORMAL HIGH (ref 0.61–1.24)
GFR, Estimated: 44 mL/min — ABNORMAL LOW (ref >60)
Glucose, Bld: 71 mg/dL (ref 70–99)
Potassium: 4.4 mmol/L (ref 3.5–5.1)
Sodium: 135 mmol/L (ref 135–145)
Total Bilirubin: 0.6 mg/dL (ref 0.3–1.2)
Total Protein: 7.2 g/dL (ref 6.5–8.1)

## 2022-02-08 LAB — CBC WITH DIFFERENTIAL (CANCER CENTER ONLY)
Abs Immature Granulocytes: 0.03 10*3/uL (ref 0.00–0.07)
Basophils Absolute: 0.1 10*3/uL (ref 0.0–0.1)
Basophils Relative: 1 %
Eosinophils Absolute: 0.7 10*3/uL — ABNORMAL HIGH (ref 0.0–0.5)
Eosinophils Relative: 6 %
HCT: 50.1 % (ref 39.0–52.0)
Hemoglobin: 16.9 g/dL (ref 13.0–17.0)
Immature Granulocytes: 0 %
Lymphocytes Relative: 15 %
Lymphs Abs: 1.8 10*3/uL (ref 0.7–4.0)
MCH: 31 pg (ref 26.0–34.0)
MCHC: 33.7 g/dL (ref 30.0–36.0)
MCV: 91.8 fL (ref 80.0–100.0)
Monocytes Absolute: 1.1 10*3/uL — ABNORMAL HIGH (ref 0.1–1.0)
Monocytes Relative: 10 %
Neutro Abs: 7.8 10*3/uL — ABNORMAL HIGH (ref 1.7–7.7)
Neutrophils Relative %: 68 %
Platelet Count: 257 10*3/uL (ref 150–400)
RBC: 5.46 MIL/uL (ref 4.22–5.81)
RDW: 13.4 % (ref 11.5–15.5)
WBC Count: 11.4 10*3/uL — ABNORMAL HIGH (ref 4.0–10.5)
nRBC: 0 % (ref 0.0–0.2)

## 2022-02-08 MED ORDER — HEPARIN SOD (PORK) LOCK FLUSH 100 UNIT/ML IV SOLN
500.0000 [IU] | Freq: Once | INTRAVENOUS | Status: AC
  Administered 2022-02-08: 500 [IU]

## 2022-02-08 MED ORDER — SODIUM CHLORIDE 0.9% FLUSH
10.0000 mL | Freq: Once | INTRAVENOUS | Status: AC
  Administered 2022-02-08: 10 mL

## 2022-02-09 ENCOUNTER — Encounter: Payer: Self-pay | Admitting: Internal Medicine

## 2022-03-20 ENCOUNTER — Telehealth: Payer: Self-pay | Admitting: Internal Medicine

## 2022-03-20 NOTE — Telephone Encounter (Signed)
Patient called to r/s port flush. R/s with patient.

## 2022-03-22 ENCOUNTER — Inpatient Hospital Stay: Payer: Federal, State, Local not specified - PPO

## 2022-03-29 ENCOUNTER — Other Ambulatory Visit: Payer: Medicare Other

## 2022-03-29 ENCOUNTER — Inpatient Hospital Stay: Payer: Federal, State, Local not specified - PPO | Attending: Internal Medicine

## 2022-03-29 ENCOUNTER — Ambulatory Visit: Payer: Medicare Other | Admitting: Internal Medicine

## 2022-03-29 ENCOUNTER — Other Ambulatory Visit: Payer: Self-pay

## 2022-03-29 DIAGNOSIS — C3412 Malignant neoplasm of upper lobe, left bronchus or lung: Secondary | ICD-10-CM | POA: Insufficient documentation

## 2022-03-29 DIAGNOSIS — Z452 Encounter for adjustment and management of vascular access device: Secondary | ICD-10-CM | POA: Insufficient documentation

## 2022-03-29 DIAGNOSIS — Z95828 Presence of other vascular implants and grafts: Secondary | ICD-10-CM

## 2022-03-29 MED ORDER — HEPARIN SOD (PORK) LOCK FLUSH 100 UNIT/ML IV SOLN
500.0000 [IU] | Freq: Once | INTRAVENOUS | Status: AC
  Administered 2022-03-29: 500 [IU]

## 2022-03-29 MED ORDER — SODIUM CHLORIDE 0.9% FLUSH
10.0000 mL | Freq: Once | INTRAVENOUS | Status: AC
  Administered 2022-03-29: 10 mL

## 2022-05-03 ENCOUNTER — Inpatient Hospital Stay: Payer: Federal, State, Local not specified - PPO | Attending: Internal Medicine

## 2022-05-03 DIAGNOSIS — C7951 Secondary malignant neoplasm of bone: Secondary | ICD-10-CM | POA: Insufficient documentation

## 2022-05-03 DIAGNOSIS — N289 Disorder of kidney and ureter, unspecified: Secondary | ICD-10-CM | POA: Insufficient documentation

## 2022-05-03 DIAGNOSIS — K7689 Other specified diseases of liver: Secondary | ICD-10-CM | POA: Insufficient documentation

## 2022-05-03 DIAGNOSIS — C3412 Malignant neoplasm of upper lobe, left bronchus or lung: Secondary | ICD-10-CM | POA: Insufficient documentation

## 2022-05-09 ENCOUNTER — Inpatient Hospital Stay: Payer: Federal, State, Local not specified - PPO

## 2022-05-09 ENCOUNTER — Ambulatory Visit (HOSPITAL_COMMUNITY)
Admission: RE | Admit: 2022-05-09 | Discharge: 2022-05-09 | Disposition: A | Discharge status: Home / Self Care | Payer: Federal, State, Local not specified - PPO | Source: Ambulatory Visit | Attending: Physician Assistant | Admitting: Physician Assistant

## 2022-05-09 ENCOUNTER — Other Ambulatory Visit: Payer: Self-pay

## 2022-05-09 DIAGNOSIS — C3492 Malignant neoplasm of unspecified part of left bronchus or lung: Secondary | ICD-10-CM

## 2022-05-09 DIAGNOSIS — C3490 Malignant neoplasm of unspecified part of unspecified bronchus or lung: Secondary | ICD-10-CM | POA: Diagnosis not present

## 2022-05-09 DIAGNOSIS — Z95828 Presence of other vascular implants and grafts: Secondary | ICD-10-CM

## 2022-05-09 DIAGNOSIS — R918 Other nonspecific abnormal finding of lung field: Secondary | ICD-10-CM | POA: Diagnosis not present

## 2022-05-09 DIAGNOSIS — K769 Liver disease, unspecified: Secondary | ICD-10-CM | POA: Diagnosis not present

## 2022-05-09 DIAGNOSIS — C3412 Malignant neoplasm of upper lobe, left bronchus or lung: Secondary | ICD-10-CM | POA: Diagnosis not present

## 2022-05-09 DIAGNOSIS — N289 Disorder of kidney and ureter, unspecified: Secondary | ICD-10-CM | POA: Diagnosis not present

## 2022-05-09 DIAGNOSIS — J439 Emphysema, unspecified: Secondary | ICD-10-CM | POA: Diagnosis not present

## 2022-05-09 DIAGNOSIS — C7951 Secondary malignant neoplasm of bone: Secondary | ICD-10-CM | POA: Diagnosis not present

## 2022-05-09 DIAGNOSIS — K7689 Other specified diseases of liver: Secondary | ICD-10-CM | POA: Diagnosis not present

## 2022-05-09 LAB — CMP (CANCER CENTER ONLY)
ALT: 18 U/L (ref 0–44)
AST: 23 U/L (ref 15–41)
Albumin: 3.8 g/dL (ref 3.5–5.0)
Alkaline Phosphatase: 93 U/L (ref 38–126)
Anion gap: 6 (ref 5–15)
BUN: 24 mg/dL — ABNORMAL HIGH (ref 8–23)
CO2: 23 mmol/L (ref 22–32)
Calcium: 9.4 mg/dL (ref 8.9–10.3)
Chloride: 104 mmol/L (ref 98–111)
Creatinine: 1.64 mg/dL — ABNORMAL HIGH (ref 0.61–1.24)
GFR, Estimated: 45 mL/min — ABNORMAL LOW (ref >60)
Glucose, Bld: 79 mg/dL (ref 70–99)
Potassium: 5 mmol/L (ref 3.5–5.1)
Sodium: 133 mmol/L — ABNORMAL LOW (ref 135–145)
Total Bilirubin: 0.8 mg/dL (ref 0.3–1.2)
Total Protein: 6.9 g/dL (ref 6.5–8.1)

## 2022-05-09 LAB — CBC WITH DIFFERENTIAL (CANCER CENTER ONLY)
Abs Immature Granulocytes: 0.04 10*3/uL (ref 0.00–0.07)
Basophils Absolute: 0.1 10*3/uL (ref 0.0–0.1)
Basophils Relative: 1 %
Eosinophils Absolute: 0.4 10*3/uL (ref 0.0–0.5)
Eosinophils Relative: 4 %
HCT: 49.2 % (ref 39.0–52.0)
Hemoglobin: 16.9 g/dL (ref 13.0–17.0)
Immature Granulocytes: 0 %
Lymphocytes Relative: 14 %
Lymphs Abs: 1.4 10*3/uL (ref 0.7–4.0)
MCH: 31.4 pg (ref 26.0–34.0)
MCHC: 34.3 g/dL (ref 30.0–36.0)
MCV: 91.4 fL (ref 80.0–100.0)
Monocytes Absolute: 1 10*3/uL (ref 0.1–1.0)
Monocytes Relative: 9 %
Neutro Abs: 7.4 10*3/uL (ref 1.7–7.7)
Neutrophils Relative %: 72 %
Platelet Count: 259 10*3/uL (ref 150–400)
RBC: 5.38 MIL/uL (ref 4.22–5.81)
RDW: 13.2 % (ref 11.5–15.5)
WBC Count: 10.3 10*3/uL (ref 4.0–10.5)
nRBC: 0 % (ref 0.0–0.2)

## 2022-05-09 MED ORDER — SODIUM CHLORIDE 0.9% FLUSH
10.0000 mL | Freq: Once | INTRAVENOUS | Status: AC
  Administered 2022-05-09: 10 mL

## 2022-05-09 MED ORDER — HEPARIN SOD (PORK) LOCK FLUSH 100 UNIT/ML IV SOLN
INTRAVENOUS | Status: AC
  Filled 2022-05-09: qty 5

## 2022-05-09 MED ORDER — HEPARIN SOD (PORK) LOCK FLUSH 100 UNIT/ML IV SOLN
500.0000 [IU] | Freq: Once | INTRAVENOUS | Status: AC
  Administered 2022-05-09: 500 [IU] via INTRAVENOUS

## 2022-05-14 ENCOUNTER — Inpatient Hospital Stay (HOSPITAL_BASED_OUTPATIENT_CLINIC_OR_DEPARTMENT_OTHER): Payer: Federal, State, Local not specified - PPO | Admitting: Internal Medicine

## 2022-05-14 ENCOUNTER — Other Ambulatory Visit: Payer: Self-pay

## 2022-05-14 VITALS — BP 176/88 | HR 102 | Temp 97.5°F | Resp 17 | Wt 161.5 lb

## 2022-05-14 DIAGNOSIS — C3490 Malignant neoplasm of unspecified part of unspecified bronchus or lung: Secondary | ICD-10-CM

## 2022-05-14 DIAGNOSIS — C7951 Secondary malignant neoplasm of bone: Secondary | ICD-10-CM | POA: Diagnosis not present

## 2022-05-14 DIAGNOSIS — N289 Disorder of kidney and ureter, unspecified: Secondary | ICD-10-CM | POA: Diagnosis not present

## 2022-05-14 DIAGNOSIS — C3412 Malignant neoplasm of upper lobe, left bronchus or lung: Secondary | ICD-10-CM | POA: Diagnosis not present

## 2022-05-14 DIAGNOSIS — K7689 Other specified diseases of liver: Secondary | ICD-10-CM | POA: Diagnosis not present

## 2022-05-14 NOTE — Progress Notes (Signed)
[facility] Telephone:([phone]) [phone]   Fax:([phone]  OFFICE PROGRESS NOTE  [name], MD [address] [place] [address]  DIAGNOSIS: Metastatic non-small cell lung cancer, adenocarcinoma initially diagnosed as stage IB (T2a, N0, M0) non-small cell lung cancer, adenocarcinoma diagnosed in December 2017.  The [name] has evidence for disease recurrence in July 2019.  Biomarker Findings Tumor Mutational Burden - TMB-Intermediate (16 Muts/Mb) Microsatellite status - MS-Stable Genomic Findings For a complete list of the genes assayed, please refer to the Appendix. KIT amplification KRAS G12C SMAD4 splice site 1309-1G>T TP53 I232F 7 Disease relevant genes with no reportable alterations: EGFR, ALK, BRAF, MET, RET, ERBB2, ROS1   PDL 1 expression is 0%  PRIOR THERAPY:  1) status post left lower lobectomy as well as wedge resection of the left upper lobe on 05/11/2016. 2) Adjuvant systemic chemotherapy with cisplatin 75 MG/M2 and Alimta 500 MG/M2 every 3 weeks. First dose 07/03/2016. Status post 4 cycles. 3) Systemic chemotherapy with carboplatin for AUC of 5, Alimta 500 mg/M2 and Keytruda 200 mg IV every 3 weeks.  Status post 47 cycles.  Starting from cycle #5 the [name] will be treated with maintenance Alimta and Ketruda (pembrolizumab) every 3 weeks.  This treatment was discontinued secondary to disease progression. 4) Lumakras (Sotorasib) 960 mg p.o. daily started October 29, 2020.  Status post 1 months of treatment.  This treatment was discontinued secondary to liver toxicity even after dose reduction.  CURRENT THERAPY: None  INTERVAL HISTORY: William Finner [name] 67 y.o. male returns to the clinic today for follow-up visit accompanied by his wife.  The [name] is feeling fine today with no concerning complaints.  He denied having any current chest pain, shortness of breath, cough or hemoptysis.  He has no nausea, vomiting, diarrhea or constipation.   He has no headache or visual changes.  The [name] denied having any weight loss or night sweats.  He has been in observation for the last 15 months or so with no concerning issues.  He is here for evaluation with repeat CT scan of the chest, abdomen and pelvis for restaging of his disease.   MEDICAL HISTORY: Past Medical History:  Diagnosis Date   AAA (abdominal aortic aneurysm) (HCC)    AAA (abdominal aortic aneurysm) without rupture (HCC) 02/04/2014   Cancer (HCC)    lung   Encounter for antineoplastic chemotherapy 06/06/2016   History of hepatitis B    HNP (herniated nucleus pulposus), lumbar    L4 with radiculopathy   Hypertension    Lung cancer (HCC) 05/18/2016   Malignant neoplasm of lower lobe of left lung (HCC) 04/05/2016   Mass of left lung    Mitral insufficiency 04/06/2016   This [name] will eventually need MV repair if his prognosis is good from oncology standpoint. However, his lung cancer therapy  is the priority right now. His cardiac condition won't preclude possible lung surgery.  Once his lung cancer is under control he will need a TEE to further evaluate his mitral valve anatomy and MR severity, and also have ischemic workup as tere is evidence on calcificati   Renal insufficiency 10/09/2016   S/P partial lobectomy of lung 05/11/2016   Varicose veins of legs     ALLERGIES:  is allergic to tramadol.  MEDICATIONS:  Current Outpatient Medications  Medication Sig Dispense Refill   lisinopril (ZESTRIL) 10 MG tablet Take 1 tablet (10 mg total) by mouth daily.  90 tablet 3   No current facility-administered medications for this visit.    SURGICAL HISTORY:  Past Surgical History:  Procedure Laterality Date   ABDOMINAL AORTIC ENDOVASCULAR STENT GRAFT N/A 03/12/2016   Procedure: ABDOMINAL AORTIC ENDOVASCULAR STENT GRAFT;  Surgeon: Fransisco Hertz, MD;  Location: [facility] OR;  Service: Vascular;  Laterality: N/A;   COLONOSCOPY     INGUINAL HERNIA REPAIR Left 1975   Left inguinal  hernia   INGUINAL HERNIA REPAIR Right    IR IMAGING GUIDED PORT INSERTION  09/04/2019   LOBECTOMY Left 05/11/2016   Procedure: LEFT LOWER LOBE LOBECTOMY AND LEFT UPPER LOBE  RESECTION AND PLACEMENT OF ON-Q;  Surgeon: Delight Ovens, MD;  Location: [facility] OR;  Service: Thoracic;  Laterality: Left;   LUMBAR LAMINECTOMY  October 22, 2012   LYMPH NODE DISSECTION Left 05/11/2016   Procedure: LYMPH NODE DISSECTION;  Surgeon: Delight Ovens, MD;  Location: [facility] OR;  Service: Thoracic;  Laterality: Left;   RIGHT/LEFT HEART CATH AND CORONARY ANGIOGRAPHY N/A 03/10/2020   Procedure: RIGHT/LEFT HEART CATH AND CORONARY ANGIOGRAPHY;  Surgeon: Tonny Bollman, MD;  Location: [facility] INVASIVE CV LAB;  Service: Cardiovascular;  Laterality: N/A;   STAPLING OF BLEBS Left 05/11/2016   Procedure: STAPLING OF APICAL BLEB;  Surgeon: Delight Ovens, MD;  Location: [facility] OR;  Service: Thoracic;  Laterality: Left;   TEE WITHOUT CARDIOVERSION N/A 10/07/2019   Procedure: TRANSESOPHAGEAL ECHOCARDIOGRAM (TEE);  Surgeon: Lars Masson, MD;  Location: [facility] ENDOSCOPY;  Service: Cardiovascular;  Laterality: N/A;   VIDEO ASSISTED THORACOSCOPY Left 05/18/2016   Procedure: VIDEO ASSISTED THORACOSCOPY WITH REMOVAL OF LEFT APICAL BLEB;  Surgeon: Delight Ovens, MD;  Location: [facility] OR;  Service: Thoracic;  Laterality: Left;   VIDEO ASSISTED THORACOSCOPY (VATS)/WEDGE RESECTION Left 05/11/2016   Procedure: LEFT VIDEO ASSISTED THORACOSCOPY (VATS);  Surgeon: Delight Ovens, MD;  Location: [facility] OR;  Service: Thoracic;  Laterality: Left;   VIDEO BRONCHOSCOPY N/A 05/11/2016   Procedure: VIDEO BRONCHOSCOPY, LEFT LUNG;  Surgeon: Delight Ovens, MD;  Location: [facility] OR;  Service: Thoracic;  Laterality: N/A;   VIDEO BRONCHOSCOPY N/A 05/18/2016   Procedure: VIDEO BRONCHOSCOPY WITH BRONCHIAL WASHING;  Surgeon: Delight Ovens, MD;  Location: [facility] OR;  Service: Thoracic;  Laterality: N/A;    REVIEW OF SYSTEMS:  Constitutional: negative Eyes: negative Ears,  nose, mouth, throat, and face: negative Respiratory: negative Cardiovascular: negative Gastrointestinal: negative Genitourinary:negative Integument/breast: negative Hematologic/lymphatic: negative Musculoskeletal:negative Neurological: negative Behavioral/Psych: negative Endocrine: negative Allergic/Immunologic: negative   PHYSICAL EXAMINATION: General appearance: alert, cooperative, and no distress Head: Normocephalic, without obvious abnormality, atraumatic Neck: no adenopathy, no JVD, supple, symmetrical, trachea midline, and thyroid not enlarged, symmetric, no tenderness/mass/nodules Lymph nodes: Cervical, supraclavicular, and axillary nodes normal. Resp: clear to auscultation bilaterally Back: symmetric, no curvature. ROM normal. No CVA tenderness. Cardio: Tachycardia GI: soft, non-tender; bowel sounds normal; no masses,  no organomegaly Extremities: extremities normal, atraumatic, no cyanosis or edema Neurologic: Alert and oriented X 3, normal strength and tone. Normal symmetric reflexes. Normal coordination and gait   ECOG PERFORMANCE STATUS: 1 - Symptomatic but completely ambulatory  Blood pressure (!) 176/88, pulse (!) 102, temperature (!) 97.5 F (36.4 C), resp. rate 17, weight 161 lb 8 oz (73.3 kg), SpO2 90 %.  LABORATORY DATA: Lab Results  Component Value Date   WBC 10.3 05/09/2022   HGB 16.9 05/09/2022   HCT 49.2 05/09/2022   MCV 91.4 05/09/2022   PLT 259 05/09/2022      Chemistry  Component Value Date/Time   NA 133 (L) 05/09/2022 1133   NA 135 06/14/2021 1342   NA 138 01/11/2017 1343   K 5.0 05/09/2022 1133   K 4.7 01/11/2017 1343   CL 104 05/09/2022 1133   CO2 23 05/09/2022 1133   CO2 23 01/11/2017 1343   BUN 24 (H) 05/09/2022 1133   BUN 19 06/14/2021 1342   BUN 20.2 01/11/2017 1343   CREATININE 1.64 (H) 05/09/2022 1133   CREATININE 1.6 (H) 01/11/2017 1343      Component Value Date/Time   CALCIUM 9.4 05/09/2022 1133   CALCIUM 9.3 01/11/2017  1343   ALKPHOS 93 05/09/2022 1133   ALKPHOS 89 01/11/2017 1343   AST 23 05/09/2022 1133   AST 41 (H) 01/11/2017 1343   ALT 18 05/09/2022 1133   ALT 27 01/11/2017 1343   BILITOT 0.8 05/09/2022 1133   BILITOT 0.49 01/11/2017 1343       RADIOGRAPHIC STUDIES: CT Chest Wo Contrast  Result Date: 05/09/2022 CLINICAL DATA:  Non-small-cell lung cancer. Assess treatment response. Stage IV. * Tracking Code: BO * EXAM: CT CHEST, ABDOMEN AND PELVIS WITHOUT CONTRAST TECHNIQUE: Multidetector CT imaging of the chest, abdomen and pelvis was performed following the standard protocol without IV contrast. RADIATION DOSE REDUCTION: This exam was performed according to the departmental dose-optimization program which includes automated exposure control, adjustment of the mA and/or kV according to [name] size and/or use of iterative reconstruction technique. COMPARISON:  02/05/2022 and older FINDINGS: CT CHEST FINDINGS Cardiovascular: On this non IV contrast exam the heart is mildly enlarged. Trace pericardial fluid. The thoracic aorta is ectatic diffusely particularly at the distal aortic arch with diameter approaching 4.2 cm. Scattered calcified atherosclerotic plaque. Right upper chest port identified. There is an atretic appearing right brachiocephalic vein and SVC. Mediastinum/Nodes: Small heterogeneous thyroid gland with a low-density nodule on the left side, unchanged from prior. Small amount of nodular tissue anterior to the ascending aorta is stable on series 504, image 39 measuring 16 by 9 mm. Otherwise no specific abnormal lymph node enlargement identified in the axillary regions, hilum or mediastinum on this noncontrast exam. Slightly patulous esophagus with small hiatal hernia. Lungs/Pleura: Advanced emphysematous lung changes identified. Surgical changes noted along the left upper lobe. There is some calcified pleural plaques identified posteriorly along the right hemithorax. Surgical changes as well along  the posterior left midlung. No consolidation, pneumothorax or effusion. Stable tiny right lower lobe lung nodule on series 506 image 155 measuring 4 mm. This has been stable since at least June 2021 the demonstrating long-term stability. Stable 3 mm right upper lobe nodule anteriorly image 86 of the same dataset also demonstrating long-term stability. No additional follow-up of the specific tiny lesions. Stable pleural thickening of the extreme inferolateral left lung base. Musculoskeletal: Mild curvature of the spine with some degenerative changes. CT ABDOMEN PELVIS FINDINGS Hepatobiliary: On this non IV contrast exam, once again there are some tiny low-attenuation lesions seen in the liver such as segment 8 measuring 9 mm. These are too small to characterize but are unchanged going back to 2021. Gallbladder is present. Pancreas: Preserved pancreatic parenchyma on this noncontrast exam. Stable prominent pancreatic duct. Spleen: Spleen is nonenlarged. Adrenals/Urinary Tract: Right adrenal gland is preserved. Stable slight thickening and nodularity left adrenal gland. Hounsfield unit of 5 on series 504, image 75 with diameter of 11 mm. Likely an adenoma. Mild bilateral renal atrophy. Prominent renal sinus fat. No abnormal calcifications seen within either kidney nor along the course  of either ureter. Preserved contours of the urinary bladder. Stomach/Bowel: On this non oral contrast exam, large bowel has a normal course and caliber with scattered stool. Normal retrocecal appendix. Stomach is nondilated. Small bowel is nondilated. Vascular/Lymphatic: Placement of aortic endograft. Preserved contours of the aorta. Similar to previous. Ablation for endoleak is limited without dedicated postcontrast evaluation. Preserved IVC. No specific abnormal lymph node enlargement present in the abdomen and pelvis. Reproductive: Mildly enlarged prostate with mass effect along the base of the bladder. Other: Anasarca.  No ascites.  Musculoskeletal: Scattered degenerative changes of the spine and pelvis. There is deformity of the greater trochanter of the right hip. The margins are somewhat sclerotic. This is unchanged from the study of October 2023 but was not seen on the older exam. Please correlate for any history of prior fracture. Degenerative changes along the spine with some curvature. Overall if there is concern of osseous metastatic disease bone scan can be performed as clinically directed. IMPRESSION: No significant interval change. Stable surgical changes along the left hemithorax. Stable tiny lung nodules. No new mass lesion, fluid collection or lymph node enlargement. Stable tiny low-attenuation liver lesions. Simple attention on follow up. Aortic endograft. Chronic appearing posttraumatic deformity of the greater trochanter of the right hip Electronically Signed   By: Karen Kays M.D.   On: 05/09/2022 13:45   CT Abdomen Pelvis Wo Contrast  Result Date: 05/09/2022 CLINICAL DATA:  Non-small-cell lung cancer. Assess treatment response. Stage IV. * Tracking Code: BO * EXAM: CT CHEST, ABDOMEN AND PELVIS WITHOUT CONTRAST TECHNIQUE: Multidetector CT imaging of the chest, abdomen and pelvis was performed following the standard protocol without IV contrast. RADIATION DOSE REDUCTION: This exam was performed according to the departmental dose-optimization program which includes automated exposure control, adjustment of the mA and/or kV according to [name] size and/or use of iterative reconstruction technique. COMPARISON:  02/05/2022 and older FINDINGS: CT CHEST FINDINGS Cardiovascular: On this non IV contrast exam the heart is mildly enlarged. Trace pericardial fluid. The thoracic aorta is ectatic diffusely particularly at the distal aortic arch with diameter approaching 4.2 cm. Scattered calcified atherosclerotic plaque. Right upper chest port identified. There is an atretic appearing right brachiocephalic vein and SVC.  Mediastinum/Nodes: Small heterogeneous thyroid gland with a low-density nodule on the left side, unchanged from prior. Small amount of nodular tissue anterior to the ascending aorta is stable on series 504, image 39 measuring 16 by 9 mm. Otherwise no specific abnormal lymph node enlargement identified in the axillary regions, hilum or mediastinum on this noncontrast exam. Slightly patulous esophagus with small hiatal hernia. Lungs/Pleura: Advanced emphysematous lung changes identified. Surgical changes noted along the left upper lobe. There is some calcified pleural plaques identified posteriorly along the right hemithorax. Surgical changes as well along the posterior left midlung. No consolidation, pneumothorax or effusion. Stable tiny right lower lobe lung nodule on series 506 image 155 measuring 4 mm. This has been stable since at least June 2021 the demonstrating long-term stability. Stable 3 mm right upper lobe nodule anteriorly image 86 of the same dataset also demonstrating long-term stability. No additional follow-up of the specific tiny lesions. Stable pleural thickening of the extreme inferolateral left lung base. Musculoskeletal: Mild curvature of the spine with some degenerative changes. CT ABDOMEN PELVIS FINDINGS Hepatobiliary: On this non IV contrast exam, once again there are some tiny low-attenuation lesions seen in the liver such as segment 8 measuring 9 mm. These are too small to characterize but are unchanged going back to  2021. Gallbladder is present. Pancreas: Preserved pancreatic parenchyma on this noncontrast exam. Stable prominent pancreatic duct. Spleen: Spleen is nonenlarged. Adrenals/Urinary Tract: Right adrenal gland is preserved. Stable slight thickening and nodularity left adrenal gland. Hounsfield unit of 5 on series 504, image 75 with diameter of 11 mm. Likely an adenoma. Mild bilateral renal atrophy. Prominent renal sinus fat. No abnormal calcifications seen within either kidney nor  along the course of either ureter. Preserved contours of the urinary bladder. Stomach/Bowel: On this non oral contrast exam, large bowel has a normal course and caliber with scattered stool. Normal retrocecal appendix. Stomach is nondilated. Small bowel is nondilated. Vascular/Lymphatic: Placement of aortic endograft. Preserved contours of the aorta. Similar to previous. Ablation for endoleak is limited without dedicated postcontrast evaluation. Preserved IVC. No specific abnormal lymph node enlargement present in the abdomen and pelvis. Reproductive: Mildly enlarged prostate with mass effect along the base of the bladder. Other: Anasarca.  No ascites. Musculoskeletal: Scattered degenerative changes of the spine and pelvis. There is deformity of the greater trochanter of the right hip. The margins are somewhat sclerotic. This is unchanged from the study of October 2023 but was not seen on the older exam. Please correlate for any history of prior fracture. Degenerative changes along the spine with some curvature. Overall if there is concern of osseous metastatic disease bone scan can be performed as clinically directed. IMPRESSION: No significant interval change. Stable surgical changes along the left hemithorax. Stable tiny lung nodules. No new mass lesion, fluid collection or lymph node enlargement. Stable tiny low-attenuation liver lesions. Simple attention on follow up. Aortic endograft. Chronic appearing posttraumatic deformity of the greater trochanter of the right hip Electronically Signed   By: Karen Kays M.D.   On: 05/09/2022 13:45    ASSESSMENT AND PLAN:  This is a very pleasant 67 years old white male with a stage IB non-small cell lung cancer, adenocarcinoma status post wedge resection of the left upper lobe followed by 4 cycles of adjuvant systemic chemotherapy with cisplatin and Alimta and he tolerated his treatment well except for fatigue. The [name] has been on observation since June 2018.    The recent imaging studies including CT scan of the chest as well as a PET scan showed evidence for disease recurrence with metastatic disease presented with bilateral pulmonary nodules as well as destructive bone lesions at T4 vertebral body, biopsy proven to be metastatic adenocarcinoma. Molecular studies by foundation 1 showed KRAS G12C mutation.  PDL 1 expression was negative. The [name] was treated with carboplatin, Alimta and Ketruda (pembrolizumab) status post 47 cycles.  Starting from cycle #5 he is on maintenance treatment with Alimta and Keytruda every 3 weeks. He has been on treatment with single agent Keytruda recently because of the renal insufficiency.  The [name] has been tolerating this treatment well with no concerning complaints.  This treatment was discontinued secondary to disease progression. He started second line treatment with Lumakras (Sotorasib) 960 mg p.o. daily on October 29, 2020. Unfortunately the [name] had significant liver dysfunction secondary to the treatment with Lumakras (Sotorasib).  Status post 1 months.  He was treated with a tapered dose of prednisone for liver toxicity.  We resumed his treatment with reduced dose Lumakras (Sotorasib) at 480 mg p.o. daily but again the [name] developed significant liver dysfunction within few days of resuming the treatment.  His treatment was discontinued and the [name] was treated with a tapered dose of prednisone. He has been in observation for around 15  months The [name] is feeling fine today with no concerning complaints. He had repeat CT scan of the chest, abdomen and pelvis performed recently.  I personally and independently reviewed the scans and discussed the results with the [name] today. His scan showed no concerning findings for disease progression. I recommended for the [name] to continue on observation with repeat CT scan of the chest, abdomen and pelvis in 4 months. For the hypertension, he was advised to take  his blood pressure medication as prescribed and to monitor it closely at home. He was advised to call immediately if he has any other concerning symptoms in the interval.  The [name] voices understanding of current disease status and treatment options and is in agreement with the current care plan. All questions were answered. The [name] knows to call the clinic with any problems, questions or concerns. We can certainly see the [name] much sooner if necessary. The total time spent in the appointment was 30 minutes.  Disclaimer: This note was dictated with voice recognition software. Similar sounding words can inadvertently be transcribed and may be missed upon review. Lajuana Matte, MD 05/14/22

## 2022-05-28 ENCOUNTER — Telehealth (HOSPITAL_COMMUNITY): Payer: Self-pay | Admitting: Cardiology

## 2022-05-28 NOTE — Telephone Encounter (Signed)
Patient called and cancelled echocardiogram scheduled for 05/29/22 due to he is sick. Patient does not wish to reschedule at this time. Order will be removed from the active echo WQ and when he calls back to reschedule we will reinstate the order. Thank you.

## 2022-05-29 ENCOUNTER — Other Ambulatory Visit (HOSPITAL_COMMUNITY): Payer: Medicare Other

## 2022-05-29 ENCOUNTER — Telehealth: Payer: Self-pay | Admitting: Internal Medicine

## 2022-05-29 NOTE — Telephone Encounter (Signed)
Called patient regarding February and May appointments. Patient is notified.

## 2022-06-14 ENCOUNTER — Inpatient Hospital Stay: Payer: Federal, State, Local not specified - PPO | Attending: Internal Medicine

## 2022-06-14 ENCOUNTER — Other Ambulatory Visit: Payer: Self-pay

## 2022-06-14 DIAGNOSIS — Z95828 Presence of other vascular implants and grafts: Secondary | ICD-10-CM

## 2022-06-14 DIAGNOSIS — Z452 Encounter for adjustment and management of vascular access device: Secondary | ICD-10-CM | POA: Diagnosis not present

## 2022-06-14 DIAGNOSIS — Z85118 Personal history of other malignant neoplasm of bronchus and lung: Secondary | ICD-10-CM | POA: Diagnosis not present

## 2022-06-14 MED ORDER — HEPARIN SOD (PORK) LOCK FLUSH 100 UNIT/ML IV SOLN
500.0000 [IU] | Freq: Once | INTRAVENOUS | Status: AC
  Administered 2022-06-14: 500 [IU]

## 2022-06-14 MED ORDER — SODIUM CHLORIDE 0.9% FLUSH
10.0000 mL | Freq: Once | INTRAVENOUS | Status: AC
  Administered 2022-06-14: 10 mL

## 2022-09-10 ENCOUNTER — Inpatient Hospital Stay: Payer: Federal, State, Local not specified - PPO | Attending: Internal Medicine

## 2022-09-10 ENCOUNTER — Other Ambulatory Visit: Payer: Self-pay

## 2022-09-10 ENCOUNTER — Ambulatory Visit (HOSPITAL_COMMUNITY)
Admission: RE | Admit: 2022-09-10 | Discharge: 2022-09-10 | Disposition: A | Discharge status: Home / Self Care | Payer: Federal, State, Local not specified - PPO | Source: Ambulatory Visit | Attending: Internal Medicine | Admitting: Internal Medicine

## 2022-09-10 DIAGNOSIS — C7951 Secondary malignant neoplasm of bone: Secondary | ICD-10-CM | POA: Insufficient documentation

## 2022-09-10 DIAGNOSIS — C3490 Malignant neoplasm of unspecified part of unspecified bronchus or lung: Secondary | ICD-10-CM

## 2022-09-10 DIAGNOSIS — K7689 Other specified diseases of liver: Secondary | ICD-10-CM | POA: Diagnosis not present

## 2022-09-10 DIAGNOSIS — C3412 Malignant neoplasm of upper lobe, left bronchus or lung: Secondary | ICD-10-CM | POA: Insufficient documentation

## 2022-09-10 DIAGNOSIS — J432 Centrilobular emphysema: Secondary | ICD-10-CM | POA: Diagnosis not present

## 2022-09-10 LAB — CBC WITH DIFFERENTIAL (CANCER CENTER ONLY)
Abs Immature Granulocytes: 0.05 10*3/uL (ref 0.00–0.07)
Basophils Absolute: 0.1 10*3/uL (ref 0.0–0.1)
Basophils Relative: 1 %
Eosinophils Absolute: 0.7 10*3/uL — ABNORMAL HIGH (ref 0.0–0.5)
Eosinophils Relative: 7 %
HCT: 50.4 % (ref 39.0–52.0)
Hemoglobin: 16.8 g/dL (ref 13.0–17.0)
Immature Granulocytes: 1 %
Lymphocytes Relative: 18 %
Lymphs Abs: 1.6 10*3/uL (ref 0.7–4.0)
MCH: 30.5 pg (ref 26.0–34.0)
MCHC: 33.3 g/dL (ref 30.0–36.0)
MCV: 91.5 fL (ref 80.0–100.0)
Monocytes Absolute: 1 10*3/uL (ref 0.1–1.0)
Monocytes Relative: 11 %
Neutro Abs: 5.8 10*3/uL (ref 1.7–7.7)
Neutrophils Relative %: 62 %
Platelet Count: 251 10*3/uL (ref 150–400)
RBC: 5.51 MIL/uL (ref 4.22–5.81)
RDW: 13.2 % (ref 11.5–15.5)
WBC Count: 9.2 10*3/uL (ref 4.0–10.5)
nRBC: 0 % (ref 0.0–0.2)

## 2022-09-10 LAB — CMP (CANCER CENTER ONLY)
ALT: 16 U/L (ref 0–44)
AST: 23 U/L (ref 15–41)
Albumin: 3.9 g/dL (ref 3.5–5.0)
Alkaline Phosphatase: 84 U/L (ref 38–126)
Anion gap: 6 (ref 5–15)
BUN: 22 mg/dL (ref 8–23)
CO2: 25 mmol/L (ref 22–32)
Calcium: 9.2 mg/dL (ref 8.9–10.3)
Chloride: 103 mmol/L (ref 98–111)
Creatinine: 1.74 mg/dL — ABNORMAL HIGH (ref 0.61–1.24)
GFR, Estimated: 42 mL/min — ABNORMAL LOW (ref >60)
Glucose, Bld: 101 mg/dL — ABNORMAL HIGH (ref 70–99)
Potassium: 5.1 mmol/L (ref 3.5–5.1)
Sodium: 134 mmol/L — ABNORMAL LOW (ref 135–145)
Total Bilirubin: 0.6 mg/dL (ref 0.3–1.2)
Total Protein: 7.2 g/dL (ref 6.5–8.1)

## 2022-09-12 ENCOUNTER — Inpatient Hospital Stay (HOSPITAL_BASED_OUTPATIENT_CLINIC_OR_DEPARTMENT_OTHER): Payer: Federal, State, Local not specified - PPO | Admitting: Internal Medicine

## 2022-09-12 ENCOUNTER — Other Ambulatory Visit: Payer: Self-pay

## 2022-09-12 VITALS — BP 130/79 | HR 85 | Temp 98.4°F | Resp 16 | Wt 167.3 lb

## 2022-09-12 DIAGNOSIS — C3490 Malignant neoplasm of unspecified part of unspecified bronchus or lung: Secondary | ICD-10-CM

## 2022-09-12 DIAGNOSIS — C7951 Secondary malignant neoplasm of bone: Secondary | ICD-10-CM | POA: Diagnosis not present

## 2022-09-12 DIAGNOSIS — C3412 Malignant neoplasm of upper lobe, left bronchus or lung: Secondary | ICD-10-CM | POA: Diagnosis not present

## 2022-09-12 DIAGNOSIS — K7689 Other specified diseases of liver: Secondary | ICD-10-CM | POA: Diagnosis not present

## 2022-09-12 NOTE — Progress Notes (Signed)
[facility] [phone]   [phone]  OFFICE PROGRESS NOTE  [name], MD [address] [place] [address]  DIAGNOSIS: Metastatic non-small cell lung cancer, adenocarcinoma initially diagnosed as stage IB (T2a, N0, M0) non-small cell lung cancer, adenocarcinoma diagnosed in December 2017.  The patient has evidence for disease recurrence in July 2019.  Biomarker Findings Tumor Mutational Burden - TMB-Intermediate (16 Muts/Mb) Microsatellite status - MS-Stable Genomic Findings For a complete list of the genes assayed, please refer to the Appendix. KIT amplification KRAS G12C SMAD4 splice site 1309-1G>T TP53 I232F 7 Disease relevant genes with no reportable alterations: EGFR, ALK, BRAF, MET, RET, ERBB2, ROS1   PDL 1 expression is 0%  PRIOR THERAPY:  1) status post left lower lobectomy as well as wedge resection of the left upper lobe on 05/11/2016. 2) Adjuvant systemic chemotherapy with cisplatin 75 MG/M2 and Alimta 500 MG/M2 every 3 weeks. First dose 07/03/2016. Status post 4 cycles. 3) Systemic chemotherapy with carboplatin for AUC of 5, Alimta 500 mg/M2 and Keytruda 200 mg IV every 3 weeks.  Status post 47 cycles.  Starting from cycle #5 the patient will be treated with maintenance Alimta and Ketruda (pembrolizumab) every 3 weeks.  This treatment was discontinued secondary to disease progression. 4) Lumakras (Sotorasib) 960 mg p.o. daily started October 29, 2020.  Status post 1 months of treatment.  This treatment was discontinued secondary to liver toxicity even after dose reduction.  CURRENT THERAPY: None  INTERVAL HISTORY: William Finner [name] 67 y.o. male returns to the clinic today for follow-up visit accompanied by his wife.  The patient is feeling fine today with no concerning complaints.  He had few episodes of lip swelling that have been in February 2024 but the patient was undergoing a lot of stress at that time with the  death of his sister.  He did not seek any medical attention and it lasted for around 3 days and resolved spontaneously.  He denied having any current chest pain, shortness of breath, cough or hemoptysis.  He has no nausea, vomiting, diarrhea or constipation.  He has no headache or visual changes.  He is here today for evaluation with repeat CT scan of the chest, abdomen and pelvis for restaging of his disease.  MEDICAL HISTORY: Past Medical History:  Diagnosis Date   AAA (abdominal aortic aneurysm) (HCC)    AAA (abdominal aortic aneurysm) without rupture (HCC) 02/04/2014   Cancer (HCC)    lung   Encounter for antineoplastic chemotherapy 06/06/2016   History of hepatitis B    HNP (herniated nucleus pulposus), lumbar    L4 with radiculopathy   Hypertension    Lung cancer (HCC) 05/18/2016   Malignant neoplasm of lower lobe of left lung (HCC) 04/05/2016   Mass of left lung    Mitral insufficiency 04/06/2016   This patient will eventually need MV repair if his prognosis is good from oncology standpoint. However, his lung cancer therapy  is the priority right now. His cardiac condition won't preclude possible lung surgery.  Once his lung cancer is under control he will need a TEE to further evaluate his mitral valve anatomy and MR severity, and also have ischemic workup as tere is evidence on calcificati   Renal insufficiency 10/09/2016   S/P partial lobectomy of lung 05/11/2016   Varicose veins of legs     ALLERGIES:  is allergic to tramadol.  MEDICATIONS:  Current Outpatient  Medications  Medication Sig Dispense Refill   lisinopril (ZESTRIL) 10 MG tablet Take 1 tablet (10 mg total) by mouth daily. 90 tablet 3   No current facility-administered medications for this visit.    SURGICAL HISTORY:  Past Surgical History:  Procedure Laterality Date   ABDOMINAL AORTIC ENDOVASCULAR STENT GRAFT N/A 03/12/2016   Procedure: ABDOMINAL AORTIC ENDOVASCULAR STENT GRAFT;  Surgeon: Fransisco Hertz, MD;   Location: [facility] OR;  Service: Vascular;  Laterality: N/A;   COLONOSCOPY     INGUINAL HERNIA REPAIR Left 1975   Left inguinal hernia   INGUINAL HERNIA REPAIR Right    IR IMAGING GUIDED PORT INSERTION  09/04/2019   LOBECTOMY Left 05/11/2016   Procedure: LEFT LOWER LOBE LOBECTOMY AND LEFT UPPER LOBE  RESECTION AND PLACEMENT OF ON-Q;  Surgeon: Delight Ovens, MD;  Location: [facility] OR;  Service: Thoracic;  Laterality: Left;   LUMBAR LAMINECTOMY  October 22, 2012   LYMPH NODE DISSECTION Left 05/11/2016   Procedure: LYMPH NODE DISSECTION;  Surgeon: Delight Ovens, MD;  Location: [facility] OR;  Service: Thoracic;  Laterality: Left;   RIGHT/LEFT HEART CATH AND CORONARY ANGIOGRAPHY N/A 03/10/2020   Procedure: RIGHT/LEFT HEART CATH AND CORONARY ANGIOGRAPHY;  Surgeon: Tonny Bollman, MD;  Location: [facility] INVASIVE CV LAB;  Service: Cardiovascular;  Laterality: N/A;   STAPLING OF BLEBS Left 05/11/2016   Procedure: STAPLING OF APICAL BLEB;  Surgeon: Delight Ovens, MD;  Location: [facility] OR;  Service: Thoracic;  Laterality: Left;   TEE WITHOUT CARDIOVERSION N/A 10/07/2019   Procedure: TRANSESOPHAGEAL ECHOCARDIOGRAM (TEE);  Surgeon: Lars Masson, MD;  Location: [facility] ENDOSCOPY;  Service: Cardiovascular;  Laterality: N/A;   VIDEO ASSISTED THORACOSCOPY Left 05/18/2016   Procedure: VIDEO ASSISTED THORACOSCOPY WITH REMOVAL OF LEFT APICAL BLEB;  Surgeon: Delight Ovens, MD;  Location: [facility] OR;  Service: Thoracic;  Laterality: Left;   VIDEO ASSISTED THORACOSCOPY (VATS)/WEDGE RESECTION Left 05/11/2016   Procedure: LEFT VIDEO ASSISTED THORACOSCOPY (VATS);  Surgeon: Delight Ovens, MD;  Location: [facility] OR;  Service: Thoracic;  Laterality: Left;   VIDEO BRONCHOSCOPY N/A 05/11/2016   Procedure: VIDEO BRONCHOSCOPY, LEFT LUNG;  Surgeon: Delight Ovens, MD;  Location: [facility] OR;  Service: Thoracic;  Laterality: N/A;   VIDEO BRONCHOSCOPY N/A 05/18/2016   Procedure: VIDEO BRONCHOSCOPY WITH BRONCHIAL WASHING;  Surgeon: Delight Ovens, MD;   Location: [facility] OR;  Service: Thoracic;  Laterality: N/A;    REVIEW OF SYSTEMS:  A comprehensive review of systems was negative except for: Constitutional: positive for fatigue   PHYSICAL EXAMINATION: General appearance: alert, cooperative, and no distress Head: Normocephalic, without obvious abnormality, atraumatic Neck: no adenopathy, no JVD, supple, symmetrical, trachea midline, and thyroid not enlarged, symmetric, no tenderness/mass/nodules Lymph nodes: Cervical, supraclavicular, and axillary nodes normal. Resp: clear to auscultation bilaterally Back: symmetric, no curvature. ROM normal. No CVA tenderness. Cardio: Tachycardia GI: soft, non-tender; bowel sounds normal; no masses,  no organomegaly Extremities: extremities normal, atraumatic, no cyanosis or edema Neurologic: Alert and oriented X 3, normal strength and tone. Normal symmetric reflexes. Normal coordination and gait   ECOG PERFORMANCE STATUS: 1 - Symptomatic but completely ambulatory  Blood pressure 130/79, pulse 85, temperature 98.4 F (36.9 C), temperature source Oral, resp. rate 16, weight 167 lb 4.8 oz (75.9 kg), SpO2 93 %.  LABORATORY DATA: Lab Results  Component Value Date   WBC 9.2 09/10/2022   HGB 16.8 09/10/2022   HCT 50.4 09/10/2022   MCV 91.5 09/10/2022   PLT 251 09/10/2022  Chemistry      Component Value Date/Time   NA 134 (L) 09/10/2022 0740   NA 135 06/14/2021 1342   NA 138 01/11/2017 1343   K 5.1 09/10/2022 0740   K 4.7 01/11/2017 1343   CL 103 09/10/2022 0740   CO2 25 09/10/2022 0740   CO2 23 01/11/2017 1343   BUN 22 09/10/2022 0740   BUN 19 06/14/2021 1342   BUN 20.2 01/11/2017 1343   CREATININE 1.74 (H) 09/10/2022 0740   CREATININE 1.6 (H) 01/11/2017 1343      Component Value Date/Time   CALCIUM 9.2 09/10/2022 0740   CALCIUM 9.3 01/11/2017 1343   ALKPHOS 84 09/10/2022 0740   ALKPHOS 89 01/11/2017 1343   AST 23 09/10/2022 0740   AST 41 (H) 01/11/2017 1343   ALT 16 09/10/2022 0740    ALT 27 01/11/2017 1343   BILITOT 0.6 09/10/2022 0740   BILITOT 0.49 01/11/2017 1343       RADIOGRAPHIC STUDIES: CT Chest Wo Contrast  Result Date: 09/11/2022 CLINICAL DATA:  Restaging non-small cell lung cancer. * Tracking Code: BO * EXAM: CT CHEST, ABDOMEN AND PELVIS WITHOUT CONTRAST TECHNIQUE: Multidetector CT imaging of the chest, abdomen and pelvis was performed following the standard protocol without IV contrast. RADIATION DOSE REDUCTION: This exam was performed according to the departmental dose-optimization program which includes automated exposure control, adjustment of the mA and/or kV according to patient size and/or use of iterative reconstruction technique. COMPARISON:  Prior CTs 05/09/2022 and 02/05/2022. FINDINGS: CT CHEST FINDINGS Cardiovascular: Right IJ Port-A-Cath extends to the superior cavoatrial junction. There is chronic narrowing of the right brachiocephalic vein and SVC. Atherosclerosis of the aorta, great vessels and coronary arteries with stable mild aortic dilatation. The ascending aorta measures 4.1 cm, and the distal aortic arch measures up to 4.2 cm. No acute vascular findings on noncontrast imaging. The heart size is normal. There is no pericardial effusion. Mediastinum/Nodes: There are no enlarged mediastinal, hilar or axillary lymph nodes. Small nodule anterior to the ascending aorta is unchanged, measuring 1.2 x 0.8 cm on image 35/2. The thyroid gland, trachea and esophagus demonstrate no significant findings. Lungs/Pleura: Moderate to severe centrilobular and paraseptal emphysema again noted. Previous left lower lobectomy with additional postsurgical changes of prior wedge resection in the left upper lobe. Scattered parenchymal scarring is unchanged. No evidence of suspicious pulmonary nodule. There is chronic pleural calcification on the right. No significant pleural effusion or pneumothorax. Musculoskeletal/Chest wall: No chest wall mass or suspicious osseous  findings. CT ABDOMEN AND PELVIS FINDINGS Hepatobiliary: The liver appears stable without suspicious finding on noncontrast imaging. Scattered low-density lesions are unchanged, and considered benign based on stability. No evidence of gallstones, gallbladder wall thickening or biliary dilatation. Pancreas: Unremarkable. No pancreatic ductal dilatation or surrounding inflammatory changes. Spleen: Normal in size without focal abnormality. Adrenals/Urinary Tract: Both adrenal glands appear normal. No evidence of urinary tract calculus, suspicious renal lesion or hydronephrosis. Stable mild renal cortical thinning and asymmetric perinephric soft tissue stranding on the left. The bladder appears unremarkable for its degree of distention. Stomach/Bowel: No enteric contrast administered. The stomach appears unremarkable for its degree of distension. No evidence of bowel wall thickening, distention or surrounding inflammatory change. The appendix appears normal. Vascular/Lymphatic: There are no enlarged abdominal or pelvic lymph nodes. Aortoiliac atherosclerosis with stable appearance of the aorta status post endograft stenting. Reproductive: Stable mild to moderate enlargement of the prostate gland. Other: No evidence of abdominal wall mass or hernia. No ascites or pneumoperitoneum. Musculoskeletal:  No acute or significant osseous findings. Stable degenerative disc disease at L4-5 and heterotopic calcifications surrounding the right greater trochanter. IMPRESSION: 1. Stable CTS of the chest, abdomen and pelvis status post left lower lobectomy and wedge resections in the left upper lobe. 2. No evidence of local recurrence or metastatic disease. 3. 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 4. Stable prostatomegaly. 5. Aortic Atherosclerosis (ICD10-I70.0) and Emphysema (ICD10-J43.9). Electronically Signed   By: Carey Bullocks M.D.   On: 09/11/2022 17:41   CT Abdomen Pelvis Wo Contrast  Result Date:  09/11/2022 CLINICAL DATA:  Restaging non-small cell lung cancer. * Tracking Code: BO * EXAM: CT CHEST, ABDOMEN AND PELVIS WITHOUT CONTRAST TECHNIQUE: Multidetector CT imaging of the chest, abdomen and pelvis was performed following the standard protocol without IV contrast. RADIATION DOSE REDUCTION: This exam was performed according to the departmental dose-optimization program which includes automated exposure control, adjustment of the mA and/or kV according to patient size and/or use of iterative reconstruction technique. COMPARISON:  Prior CTs 05/09/2022 and 02/05/2022. FINDINGS: CT CHEST FINDINGS Cardiovascular: Right IJ Port-A-Cath extends to the superior cavoatrial junction. There is chronic narrowing of the right brachiocephalic vein and SVC. Atherosclerosis of the aorta, great vessels and coronary arteries with stable mild aortic dilatation. The ascending aorta measures 4.1 cm, and the distal aortic arch measures up to 4.2 cm. No acute vascular findings on noncontrast imaging. The heart size is normal. There is no pericardial effusion. Mediastinum/Nodes: There are no enlarged mediastinal, hilar or axillary lymph nodes. Small nodule anterior to the ascending aorta is unchanged, measuring 1.2 x 0.8 cm on image 35/2. The thyroid gland, trachea and esophagus demonstrate no significant findings. Lungs/Pleura: Moderate to severe centrilobular and paraseptal emphysema again noted. Previous left lower lobectomy with additional postsurgical changes of prior wedge resection in the left upper lobe. Scattered parenchymal scarring is unchanged. No evidence of suspicious pulmonary nodule. There is chronic pleural calcification on the right. No significant pleural effusion or pneumothorax. Musculoskeletal/Chest wall: No chest wall mass or suspicious osseous findings. CT ABDOMEN AND PELVIS FINDINGS Hepatobiliary: The liver appears stable without suspicious finding on noncontrast imaging. Scattered low-density lesions are  unchanged, and considered benign based on stability. No evidence of gallstones, gallbladder wall thickening or biliary dilatation. Pancreas: Unremarkable. No pancreatic ductal dilatation or surrounding inflammatory changes. Spleen: Normal in size without focal abnormality. Adrenals/Urinary Tract: Both adrenal glands appear normal. No evidence of urinary tract calculus, suspicious renal lesion or hydronephrosis. Stable mild renal cortical thinning and asymmetric perinephric soft tissue stranding on the left. The bladder appears unremarkable for its degree of distention. Stomach/Bowel: No enteric contrast administered. The stomach appears unremarkable for its degree of distension. No evidence of bowel wall thickening, distention or surrounding inflammatory change. The appendix appears normal. Vascular/Lymphatic: There are no enlarged abdominal or pelvic lymph nodes. Aortoiliac atherosclerosis with stable appearance of the aorta status post endograft stenting. Reproductive: Stable mild to moderate enlargement of the prostate gland. Other: No evidence of abdominal wall mass or hernia. No ascites or pneumoperitoneum. Musculoskeletal: No acute or significant osseous findings. Stable degenerative disc disease at L4-5 and heterotopic calcifications surrounding the right greater trochanter. IMPRESSION: 1. Stable CTS of the chest, abdomen and pelvis status post left lower lobectomy and wedge resections in the left upper lobe. 2. No evidence of local recurrence or metastatic disease. 3. 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 4. Stable prostatomegaly. 5. Aortic Atherosclerosis (ICD10-I70.0) and Emphysema (ICD10-J43.9). Electronically Signed   By: Carey Bullocks  M.D.   On: 09/11/2022 17:41    ASSESSMENT AND PLAN:  This is a very pleasant 66 years old white male with a stage IB non-small cell lung cancer, adenocarcinoma status post wedge resection of the left upper lobe followed by 4 cycles of adjuvant  systemic chemotherapy with cisplatin and Alimta and he tolerated his treatment well except for fatigue. The patient has been on observation since June 2018.   The recent imaging studies including CT scan of the chest as well as a PET scan showed evidence for disease recurrence with metastatic disease presented with bilateral pulmonary nodules as well as destructive bone lesions at T4 vertebral body, biopsy proven to be metastatic adenocarcinoma. Molecular studies by foundation 1 showed KRAS G12C mutation.  PDL 1 expression was negative. The patient was treated with carboplatin, Alimta and Ketruda (pembrolizumab) status post 47 cycles.  Starting from cycle #5 he is on maintenance treatment with Alimta and Keytruda every 3 weeks. He has been on treatment with single agent Keytruda recently because of the renal insufficiency.  The patient has been tolerating this treatment well with no concerning complaints.  This treatment was discontinued secondary to disease progression. He started second line treatment with Lumakras (Sotorasib) 960 mg p.o. daily on October 29, 2020. Unfortunately the patient had significant liver dysfunction secondary to the treatment with Lumakras (Sotorasib).  Status post 1 months.  He was treated with a tapered dose of prednisone for liver toxicity.  We resumed his treatment with reduced dose Lumakras (Sotorasib) at 480 mg p.o. daily but again the patient developed significant liver dysfunction within few days of resuming the treatment.  His treatment was discontinued and the patient was treated with a tapered dose of prednisone. He has been in observation for around 19 months The patient is doing fine today with no concerning complaints. He had repeat CT scan of the chest performed recently.  I personally and independently reviewed the scan and discussed the result with the patient and his wife. His scan showed no concerning findings for disease metastasis. I recommended for the patient  to continue on observation with repeat CT scan of the chest in 6 months. For the lip swelling that happened several months ago, I strongly recommend for the patient to seek medical attention or go to the emergency department immediately for evaluation if it happens again to rule out any angioedema or other allergic reaction. He was advised to call immediately if he has any other concerning symptoms in the interval.  The patient voices understanding of current disease status and treatment options and is in agreement with the current care plan. All questions were answered. The patient knows to call the clinic with any problems, questions or concerns. We can certainly see the patient much sooner if necessary. The total time spent in the appointment was 20 minutes.  Disclaimer: This note was dictated with voice recognition software. Similar sounding words can inadvertently be transcribed and may be missed upon review. Lajuana Matte, MD 09/12/22

## 2022-12-25 ENCOUNTER — Encounter (HOSPITAL_COMMUNITY): Payer: Medicare Other

## 2022-12-25 ENCOUNTER — Other Ambulatory Visit (HOSPITAL_COMMUNITY): Payer: Medicare Other

## 2022-12-25 ENCOUNTER — Ambulatory Visit: Payer: Medicare Other

## 2023-01-14 ENCOUNTER — Other Ambulatory Visit: Payer: Self-pay | Admitting: *Deleted

## 2023-01-14 DIAGNOSIS — Z79899 Other long term (current) drug therapy: Secondary | ICD-10-CM

## 2023-01-14 DIAGNOSIS — I341 Nonrheumatic mitral (valve) prolapse: Secondary | ICD-10-CM

## 2023-01-14 DIAGNOSIS — I10 Essential (primary) hypertension: Secondary | ICD-10-CM

## 2023-01-14 DIAGNOSIS — I340 Nonrheumatic mitral (valve) insufficiency: Secondary | ICD-10-CM

## 2023-01-14 MED ORDER — LISINOPRIL 10 MG PO TABS: 10.0000 mg | ORAL_TABLET | Freq: Every day | ORAL | 0 refills | Status: DC

## 2023-01-16 ENCOUNTER — Ambulatory Visit: Payer: Medicare Other

## 2023-01-16 ENCOUNTER — Encounter (HOSPITAL_COMMUNITY): Payer: Medicare Other

## 2023-01-16 ENCOUNTER — Other Ambulatory Visit (HOSPITAL_COMMUNITY): Payer: Medicare Other

## 2023-02-08 ENCOUNTER — Other Ambulatory Visit: Payer: Self-pay

## 2023-02-08 DIAGNOSIS — Z9889 Other specified postprocedural states: Secondary | ICD-10-CM

## 2023-02-12 ENCOUNTER — Other Ambulatory Visit: Payer: Self-pay | Admitting: Physician Assistant

## 2023-02-12 DIAGNOSIS — I10 Essential (primary) hypertension: Secondary | ICD-10-CM

## 2023-02-12 DIAGNOSIS — I340 Nonrheumatic mitral (valve) insufficiency: Secondary | ICD-10-CM

## 2023-02-12 DIAGNOSIS — I341 Nonrheumatic mitral (valve) prolapse: Secondary | ICD-10-CM

## 2023-02-12 DIAGNOSIS — Z79899 Other long term (current) drug therapy: Secondary | ICD-10-CM

## 2023-02-21 ENCOUNTER — Ambulatory Visit (HOSPITAL_COMMUNITY): Payer: Medicare Other

## 2023-02-21 ENCOUNTER — Ambulatory Visit: Payer: Medicare Other

## 2023-02-27 ENCOUNTER — Other Ambulatory Visit: Payer: Self-pay | Admitting: Physician Assistant

## 2023-02-27 DIAGNOSIS — I340 Nonrheumatic mitral (valve) insufficiency: Secondary | ICD-10-CM

## 2023-02-27 DIAGNOSIS — Z79899 Other long term (current) drug therapy: Secondary | ICD-10-CM

## 2023-02-27 DIAGNOSIS — I341 Nonrheumatic mitral (valve) prolapse: Secondary | ICD-10-CM

## 2023-02-27 DIAGNOSIS — I10 Essential (primary) hypertension: Secondary | ICD-10-CM

## 2023-03-11 ENCOUNTER — Ambulatory Visit (HOSPITAL_COMMUNITY)
Admission: RE | Admit: 2023-03-11 | Discharge: 2023-03-11 | Disposition: A | Discharge status: Home / Self Care | Payer: Federal, State, Local not specified - PPO | Source: Ambulatory Visit | Attending: Internal Medicine | Admitting: Internal Medicine

## 2023-03-11 ENCOUNTER — Other Ambulatory Visit: Payer: Medicare Other

## 2023-03-11 ENCOUNTER — Encounter: Payer: Self-pay | Admitting: Medical Oncology

## 2023-03-11 ENCOUNTER — Other Ambulatory Visit: Payer: Self-pay | Admitting: Internal Medicine

## 2023-03-11 ENCOUNTER — Other Ambulatory Visit: Payer: Self-pay

## 2023-03-11 ENCOUNTER — Inpatient Hospital Stay: Payer: Federal, State, Local not specified - PPO | Attending: Internal Medicine

## 2023-03-11 DIAGNOSIS — C3490 Malignant neoplasm of unspecified part of unspecified bronchus or lung: Secondary | ICD-10-CM

## 2023-03-11 DIAGNOSIS — Z85118 Personal history of other malignant neoplasm of bronchus and lung: Secondary | ICD-10-CM | POA: Diagnosis not present

## 2023-03-11 DIAGNOSIS — J439 Emphysema, unspecified: Secondary | ICD-10-CM | POA: Diagnosis not present

## 2023-03-11 DIAGNOSIS — C7951 Secondary malignant neoplasm of bone: Secondary | ICD-10-CM | POA: Diagnosis not present

## 2023-03-11 DIAGNOSIS — C7801 Secondary malignant neoplasm of right lung: Secondary | ICD-10-CM | POA: Diagnosis not present

## 2023-03-11 DIAGNOSIS — I700 Atherosclerosis of aorta: Secondary | ICD-10-CM | POA: Diagnosis not present

## 2023-03-11 DIAGNOSIS — J929 Pleural plaque without asbestos: Secondary | ICD-10-CM | POA: Diagnosis not present

## 2023-03-11 LAB — CMP (CANCER CENTER ONLY)
ALT: 23 U/L (ref 0–44)
AST: 30 U/L (ref 15–41)
Albumin: 4 g/dL (ref 3.5–5.0)
Alkaline Phosphatase: 87 U/L (ref 38–126)
Anion gap: 5 (ref 5–15)
BUN: 12 mg/dL (ref 8–23)
CO2: 25 mmol/L (ref 22–32)
Calcium: 9.3 mg/dL (ref 8.9–10.3)
Chloride: 104 mmol/L (ref 98–111)
Creatinine: 1.46 mg/dL — ABNORMAL HIGH (ref 0.61–1.24)
GFR, Estimated: 52 mL/min — ABNORMAL LOW (ref >60)
Glucose, Bld: 112 mg/dL — ABNORMAL HIGH (ref 70–99)
Potassium: 4.1 mmol/L (ref 3.5–5.1)
Sodium: 134 mmol/L — ABNORMAL LOW (ref 135–145)
Total Bilirubin: 0.6 mg/dL (ref <1.2)
Total Protein: 7.4 g/dL (ref 6.5–8.1)

## 2023-03-11 LAB — CBC WITH DIFFERENTIAL (CANCER CENTER ONLY)
Abs Immature Granulocytes: 0.03 10*3/uL (ref 0.00–0.07)
Basophils Absolute: 0.1 10*3/uL (ref 0.0–0.1)
Basophils Relative: 1 %
Eosinophils Absolute: 0.6 10*3/uL — ABNORMAL HIGH (ref 0.0–0.5)
Eosinophils Relative: 7 %
HCT: 52.2 % — ABNORMAL HIGH (ref 39.0–52.0)
Hemoglobin: 17.4 g/dL — ABNORMAL HIGH (ref 13.0–17.0)
Immature Granulocytes: 0 %
Lymphocytes Relative: 17 %
Lymphs Abs: 1.6 10*3/uL (ref 0.7–4.0)
MCH: 31.1 pg (ref 26.0–34.0)
MCHC: 33.3 g/dL (ref 30.0–36.0)
MCV: 93.4 fL (ref 80.0–100.0)
Monocytes Absolute: 0.8 10*3/uL (ref 0.1–1.0)
Monocytes Relative: 8 %
Neutro Abs: 6.1 10*3/uL (ref 1.7–7.7)
Neutrophils Relative %: 67 %
Platelet Count: 265 10*3/uL (ref 150–400)
RBC: 5.59 MIL/uL (ref 4.22–5.81)
RDW: 13.6 % (ref 11.5–15.5)
WBC Count: 9.2 10*3/uL (ref 4.0–10.5)
nRBC: 0 % (ref 0.0–0.2)

## 2023-03-13 ENCOUNTER — Inpatient Hospital Stay (HOSPITAL_BASED_OUTPATIENT_CLINIC_OR_DEPARTMENT_OTHER): Payer: Federal, State, Local not specified - PPO | Admitting: Internal Medicine

## 2023-03-13 VITALS — BP 132/82 | HR 87 | Temp 97.8°F | Resp 17 | Ht 75.0 in | Wt 160.0 lb

## 2023-03-13 DIAGNOSIS — C7951 Secondary malignant neoplasm of bone: Secondary | ICD-10-CM | POA: Diagnosis not present

## 2023-03-13 DIAGNOSIS — C7801 Secondary malignant neoplasm of right lung: Secondary | ICD-10-CM | POA: Diagnosis not present

## 2023-03-13 DIAGNOSIS — C3490 Malignant neoplasm of unspecified part of unspecified bronchus or lung: Secondary | ICD-10-CM | POA: Diagnosis not present

## 2023-03-13 DIAGNOSIS — Z85118 Personal history of other malignant neoplasm of bronchus and lung: Secondary | ICD-10-CM | POA: Diagnosis not present

## 2023-03-13 NOTE — Progress Notes (Signed)
[facility] [phone]) [phone]   [phone]  OFFICE PROGRESS NOTE  [name], MD [address], [address] [place] [address]  DIAGNOSIS: Metastatic non-small cell lung cancer, adenocarcinoma initially diagnosed as stage IB (T2a, N0, M0) non-small cell lung cancer, adenocarcinoma diagnosed in December 2017.  The patient has evidence for disease recurrence in July 2019.  Biomarker Findings Tumor Mutational Burden - TMB-Intermediate (16 Muts/Mb) Microsatellite status - MS-Stable Genomic Findings For a complete list of the genes assayed, please refer to the Appendix. KIT amplification KRAS G12C SMAD4 splice site 1309-1G>T TP53 I232F 7 Disease relevant genes with no reportable alterations: EGFR, ALK, BRAF, MET, RET, ERBB2, ROS1   PDL 1 expression is 0%  PRIOR THERAPY:  1) status post left lower lobectomy as well as wedge resection of the left upper lobe on 05/11/2016. 2) Adjuvant systemic chemotherapy with cisplatin 75 MG/M2 and Alimta 500 MG/M2 every 3 weeks. First dose 07/03/2016. Status post 4 cycles. 3) Systemic chemotherapy with carboplatin for AUC of 5, Alimta 500 mg/M2 and Keytruda 200 mg IV every 3 weeks.  Status post 47 cycles.  Starting from cycle #5 the patient will be treated with maintenance Alimta and Ketruda (pembrolizumab) every 3 weeks.  This treatment was discontinued secondary to disease progression. 4) Lumakras (Sotorasib) 960 mg p.o. daily started October 29, 2020.  Status post 1 months of treatment.  This treatment was discontinued secondary to liver toxicity even after dose reduction.  CURRENT THERAPY: Observation.  INTERVAL HISTORY: William Finner [name] 67 y.o. male turns to the clinic today for follow-up visit accompanied by his wife.Discussed the use of AI scribe software for clinical note transcription with the patient, who gave verbal consent to proceed.  History of Present Illness   The patient, a 67 year old with a history of  lung cancer, initially diagnosed in 2017, underwent surgical resection of the upper left lung. In 2019, there was evidence of cancer recurrence. Genetic markers were tested, revealing a KRAS G12C mutation. The patient was initially treated with Carboplatin, Alimta, and Keytruda chemotherapy followed by maintenance therapy with Lina Sar, which was effective for a time. However, the cancer began to progress after 47 cycles, prompting a switch to Lumakras. The patient experienced significant side effects from Lumakras, including elevated liver enzymes, leading to discontinuation of the drug. The patient has been under observation for the past 25 months.  Over the past six months, the patient reports feeling "pretty good" with no significant health issues, except for a recent cold. The patient denies any chest pain, nausea, vomiting, diarrhea, or changes in vision. There is no reported increase in shortness of breath beyond the patient's baseline.  There is a noted area of thickening in the lower part of the left lung, which has been stable and could be scarring.  The patient's medication regimen has remained unchanged.        MEDICAL HISTORY: Past Medical History:  Diagnosis Date   AAA (abdominal aortic aneurysm) (HCC)    AAA (abdominal aortic aneurysm) without rupture (HCC) 02/04/2014   Cancer (HCC)    lung   Encounter for antineoplastic chemotherapy 06/06/2016   History of hepatitis B    HNP (herniated nucleus pulposus), lumbar    L4 with radiculopathy   Hypertension    Lung cancer (HCC) 05/18/2016   Malignant neoplasm of lower lobe of left lung (HCC) 04/05/2016   Mass of left lung    Mitral insufficiency 04/06/2016  This patient will eventually need MV repair if his prognosis is good from oncology standpoint. However, his lung cancer therapy  is the priority right now. His cardiac condition won't preclude possible lung surgery.  Once his lung cancer is under control he will need a TEE to  further evaluate his mitral valve anatomy and MR severity, and also have ischemic workup as tere is evidence on calcificati   Renal insufficiency 10/09/2016   S/P partial lobectomy of lung 05/11/2016   Varicose veins of legs     ALLERGIES:  is allergic to tramadol.  MEDICATIONS:  Current Outpatient Medications  Medication Sig Dispense Refill   lisinopril (ZESTRIL) 10 MG tablet TAKE 1 TABLET BY MOUTH EVERY DAY 15 tablet 0   No current facility-administered medications for this visit.    SURGICAL HISTORY:  Past Surgical History:  Procedure Laterality Date   ABDOMINAL AORTIC ENDOVASCULAR STENT GRAFT N/A 03/12/2016   Procedure: ABDOMINAL AORTIC ENDOVASCULAR STENT GRAFT;  Surgeon: Fransisco Hertz, MD;  Location: [facility] OR;  Service: Vascular;  Laterality: N/A;   COLONOSCOPY     INGUINAL HERNIA REPAIR Left 1975   Left inguinal hernia   INGUINAL HERNIA REPAIR Right    IR IMAGING GUIDED PORT INSERTION  09/04/2019   LOBECTOMY Left 05/11/2016   Procedure: LEFT LOWER LOBE LOBECTOMY AND LEFT UPPER LOBE  RESECTION AND PLACEMENT OF ON-Q;  Surgeon: Delight Ovens, MD;  Location: [facility] OR;  Service: Thoracic;  Laterality: Left;   LUMBAR LAMINECTOMY  October 22, 2012   LYMPH NODE DISSECTION Left 05/11/2016   Procedure: LYMPH NODE DISSECTION;  Surgeon: Delight Ovens, MD;  Location: [facility] OR;  Service: Thoracic;  Laterality: Left;   RIGHT/LEFT HEART CATH AND CORONARY ANGIOGRAPHY N/A 03/10/2020   Procedure: RIGHT/LEFT HEART CATH AND CORONARY ANGIOGRAPHY;  Surgeon: Tonny Bollman, MD;  Location: [facility] INVASIVE CV LAB;  Service: Cardiovascular;  Laterality: N/A;   STAPLING OF BLEBS Left 05/11/2016   Procedure: STAPLING OF APICAL BLEB;  Surgeon: Delight Ovens, MD;  Location: [facility] OR;  Service: Thoracic;  Laterality: Left;   TEE WITHOUT CARDIOVERSION N/A 10/07/2019   Procedure: TRANSESOPHAGEAL ECHOCARDIOGRAM (TEE);  Surgeon: Lars Masson, MD;  Location: [facility] ENDOSCOPY;  Service: Cardiovascular;  Laterality: N/A;    VIDEO ASSISTED THORACOSCOPY Left 05/18/2016   Procedure: VIDEO ASSISTED THORACOSCOPY WITH REMOVAL OF LEFT APICAL BLEB;  Surgeon: Delight Ovens, MD;  Location: [facility] OR;  Service: Thoracic;  Laterality: Left;   VIDEO ASSISTED THORACOSCOPY (VATS)/WEDGE RESECTION Left 05/11/2016   Procedure: LEFT VIDEO ASSISTED THORACOSCOPY (VATS);  Surgeon: Delight Ovens, MD;  Location: [facility] OR;  Service: Thoracic;  Laterality: Left;   VIDEO BRONCHOSCOPY N/A 05/11/2016   Procedure: VIDEO BRONCHOSCOPY, LEFT LUNG;  Surgeon: Delight Ovens, MD;  Location: [facility] OR;  Service: Thoracic;  Laterality: N/A;   VIDEO BRONCHOSCOPY N/A 05/18/2016   Procedure: VIDEO BRONCHOSCOPY WITH BRONCHIAL WASHING;  Surgeon: Delight Ovens, MD;  Location: [facility] OR;  Service: Thoracic;  Laterality: N/A;    REVIEW OF SYSTEMS:  Constitutional: positive for fatigue Eyes: negative Ears, nose, mouth, throat, and face: negative Respiratory: positive for dyspnea on exertion Cardiovascular: negative Gastrointestinal: negative Genitourinary:negative Integument/breast: negative Hematologic/lymphatic: negative Musculoskeletal:negative Neurological: negative Behavioral/Psych: negative Endocrine: negative Allergic/Immunologic: negative   PHYSICAL EXAMINATION: General appearance: alert, cooperative, and no distress Head: Normocephalic, without obvious abnormality, atraumatic Neck: no adenopathy, no JVD, supple, symmetrical, trachea midline, and thyroid not enlarged, symmetric, no tenderness/mass/nodules Lymph nodes: Cervical, supraclavicular, and axillary nodes normal. Resp: clear to  auscultation bilaterally Back: symmetric, no curvature. ROM normal. No CVA tenderness. Cardio: regular rate and rhythm, S1, S2 normal, no murmur, click, rub or gallop GI: soft, non-tender; bowel sounds normal; no masses,  no organomegaly Extremities: extremities normal, atraumatic, no cyanosis or edema Neurologic: Alert and oriented X 3, normal strength and tone.  Normal symmetric reflexes. Normal coordination and gait   ECOG PERFORMANCE STATUS: 1 - Symptomatic but completely ambulatory  Blood pressure 132/82, pulse 87, temperature 97.8 F (36.6 C), temperature source Temporal, resp. rate 17, height 6\' 3"  (1.905 m), weight 160 lb (72.6 kg), SpO2 96%.  LABORATORY DATA: Lab Results  Component Value Date   WBC 9.2 03/11/2023   HGB 17.4 (H) 03/11/2023   HCT 52.2 (H) 03/11/2023   MCV 93.4 03/11/2023   PLT 265 03/11/2023      Chemistry      Component Value Date/Time   NA 134 (L) 03/11/2023 1026   NA 135 06/14/2021 1342   NA 138 01/11/2017 1343   K 4.1 03/11/2023 1026   K 4.7 01/11/2017 1343   CL 104 03/11/2023 1026   CO2 25 03/11/2023 1026   CO2 23 01/11/2017 1343   BUN 12 03/11/2023 1026   BUN 19 06/14/2021 1342   BUN 20.2 01/11/2017 1343   CREATININE 1.46 (H) 03/11/2023 1026   CREATININE 1.6 (H) 01/11/2017 1343      Component Value Date/Time   CALCIUM 9.3 03/11/2023 1026   CALCIUM 9.3 01/11/2017 1343   ALKPHOS 87 03/11/2023 1026   ALKPHOS 89 01/11/2017 1343   AST 30 03/11/2023 1026   AST 41 (H) 01/11/2017 1343   ALT 23 03/11/2023 1026   ALT 27 01/11/2017 1343   BILITOT 0.6 03/11/2023 1026   BILITOT 0.49 01/11/2017 1343       RADIOGRAPHIC STUDIES: CT CHEST ABDOMEN PELVIS WO CONTRAST  Result Date: 03/13/2023 CLINICAL DATA:  Non-small cell lung cancer EXAM: CT CHEST, ABDOMEN AND PELVIS WITHOUT CONTRAST TECHNIQUE: Multidetector CT imaging of the chest, abdomen and pelvis was performed following the standard protocol without IV contrast. RADIATION DOSE REDUCTION: This exam was performed according to the departmental dose-optimization program which includes automated exposure control, adjustment of the mA and/or kV according to patient size and/or use of iterative reconstruction technique. COMPARISON:  09/10/2022 FINDINGS: CT CHEST FINDINGS Cardiovascular: The heart is normal in size. No pericardial effusion. No evidence of thoracic  aortic aneurysm. Atherosclerotic calcifications of the aortic arch. Right chest port terminates the cavoatrial junction. Mediastinum/Nodes: No suspicious mediastinal lymphadenopathy. 7 x 15 mm soft tissue nodule with peripheral calcification in the anterior mediastinum (series 2/image 37), grossly unchanged, with a benign appearance. Visualized thyroid is grossly unremarkable. Lungs/Pleura: Status post left lower lobectomy. Status post left upper lobe wedge resection. Moderate centrilobular and paraseptal emphysematous changes, upper lung predominant. Mild subpleural reticulation/fibrosis in the lower lungs. Stable 6 mm pleural-based nodule at the right lung base (series 4/image 144), benign. No new/suspicious pulmonary nodules. Calcified right pleural plaque. Pleural thickening at the lateral left lung base measuring 5.0 x 2.7 x 2.3 cm (series 2/image 84), similar to the most recent prior and possibly postsurgical, although progressive remote priors. No pleural effusion or pneumothorax. Musculoskeletal: Thoracic spine is within normal limits. CT ABDOMEN PELVIS FINDINGS Hepatobiliary: Unenhanced liver is grossly unremarkable. Probable small hepatic cysts bilaterally (series 2/images 66 and 77), poorly visualized due to motion degradation, unchanged and likely benign. Gallbladder is unremarkable. No intrahepatic or extrahepatic ductal dilatation. Pancreas: Within normal limits. Spleen: Within normal limits. Adrenals/Urinary  Tract: Adrenal glands are within normal limits. Kidneys are within normal limits. No renal, ureteral, or bladder calculi. No hydronephrosis. Thick-walled bladder, although underdistended. Stomach/Bowel: Stomach is within normal limits. No evidence of bowel obstruction. Normal appendix (series 2/image 99). No colonic wall thickening or inflammatory changes. Vascular/Lymphatic: No evidence of abdominal aortic aneurysm. Aorto bi-iliac stents. Atherosclerotic calcifications of the abdominal aorta and  branch vessels. No suspicious abdominopelvic lymphadenopathy. Reproductive: Prostatomegaly, with enlargement of the central gland, suggesting BPH. Other: No abdominopelvic ascites. Musculoskeletal: Mild degenerative changes of the lower lumbar spine. IMPRESSION: Status post left lower lobectomy and left upper lobe wedge resection. Pleural thickening at the lateral left lung base, possibly postsurgical, although progressive from remote priors. Consider attention on follow-up versus PET-CT as clinically warranted. Otherwise, no findings suspicious for recurrent or metastatic disease. Additional stable ancillary findings as above. Aortic Atherosclerosis (ICD10-I70.0) and Emphysema (ICD10-J43.9). Electronically Signed   By: Charline Bills M.D.   On: 03/13/2023 12:27    ASSESSMENT AND PLAN:  This is a very pleasant 67 years old white male with a stage IB non-small cell lung cancer, adenocarcinoma status post wedge resection of the left upper lobe followed by 4 cycles of adjuvant systemic chemotherapy with cisplatin and Alimta and he tolerated his treatment well except for fatigue. The patient has been on observation since June 2018.   The recent imaging studies including CT scan of the chest as well as a PET scan showed evidence for disease recurrence with metastatic disease presented with bilateral pulmonary nodules as well as destructive bone lesions at T4 vertebral body, biopsy proven to be metastatic adenocarcinoma. Molecular studies by foundation 1 showed KRAS G12C mutation.  PDL 1 expression was negative. The patient was treated with carboplatin, Alimta and Ketruda (pembrolizumab) status post 47 cycles.  Starting from cycle #5 he is on maintenance treatment with Alimta and Keytruda every 3 weeks. He has been on treatment with single agent Keytruda recently because of the renal insufficiency.  The patient has been tolerating this treatment well with no concerning complaints.  This treatment was  discontinued secondary to disease progression. He started second line treatment with Lumakras (Sotorasib) 960 mg p.o. daily on October 29, 2020. Unfortunately the patient had significant liver dysfunction secondary to the treatment with Lumakras (Sotorasib).  Status post 1 months.  He was treated with a tapered dose of prednisone for liver toxicity.  We resumed his treatment with reduced dose Lumakras (Sotorasib) at 480 mg p.o. daily but again the patient developed significant liver dysfunction within few days of resuming the treatment.  His treatment was discontinued and the patient was treated with a tapered dose of prednisone. He has been in observation for around 25 months.  The patient is feeling fine today with no concerning complaints. He had repeat CT scan of the chest, abdomen and pelvis performed recently. I personally and independently reviewed the scan images and discussed the result with the patient today. His scan showed no concerning findings for recurrent or metastatic disease but there was pleural thickening at the lateral left lung base possibly postsurgical but progressive from remote prior exam and require attention on follow-up imaging studies.    Metastatic non-small cell lung cancer, adenocarcinoma initially diagnosed as stage IB (T2a, N0, M0) non-small cell lung cancer, adenocarcinoma diagnosed in December 2017.  The patient has evidence for disease recurrence in July 2019 treated with carboplatin, pemetrexed, and pembrolizumab, then switched to Sotorasib due to progression but discontinued due to elevated liver enzymes. Currently well-managed  on observation for 25 months. Recent scan shows stable disease with thickening in the left lower lobe, likely scarring but requires monitoring for severe pain or increased dyspnea. Discussed that thickening could be scarring or consolidation, but recurrence is a consideration. Emphasized monitoring symptoms and contacting if severe pain or increased  dyspnea occurs. - Continue observation - Schedule follow-up scan and lab work one week before next visit in six months - Monitor for severe pain or increased dyspnea and call if symptoms occur  Follow-up - Schedule follow-up visit in six months - Perform scan and lab work one week before the next visit.   The patient was advised to call immediately if he has any other concerning symptoms in the interval. The patient voices understanding of current disease status and treatment options and is in agreement with the current care plan. All questions were answered. The patient knows to call the clinic with any problems, questions or concerns. We can certainly see the patient much sooner if necessary. The total time spent in the appointment was 30 minutes.  Disclaimer: This note was dictated with voice recognition software. Similar sounding words can inadvertently be transcribed and may be missed upon review. Lajuana Matte, MD 03/13/23

## 2023-03-15 ENCOUNTER — Telehealth: Payer: Self-pay

## 2023-03-15 DIAGNOSIS — I340 Nonrheumatic mitral (valve) insufficiency: Secondary | ICD-10-CM

## 2023-03-15 NOTE — Telephone Encounter (Signed)
The patient has no-showed to several Cardiology appointments and his cardiologist has since left the system.  Called to check on him and offer an appointment. He declined at this time, requesting a call after the first of the year to reschedule.

## 2023-04-26 NOTE — Addendum Note (Signed)
 Addended by: Gunnar Fusi A on: 04/26/2023 11:34 AM   Modules accepted: Orders

## 2023-04-26 NOTE — Telephone Encounter (Signed)
 Left message to call back.  Will arrange echo/office visit with Dr. Excell Seltzer at next available time.

## 2023-04-26 NOTE — Telephone Encounter (Signed)
 Scheduled the patient for echo and office visit with Dr. Excell Seltzer on 05/27/2023. He was grateful for call and agreed with plan.

## 2023-05-14 NOTE — Telephone Encounter (Signed)
The patient called and requested to reschedule his echo/office visit with Dr. Excell Seltzer due to a death in the family. Rescheduled both appointments to 06/06/2023 per patient request. He was grateful for call and agreed with plan.

## 2023-05-27 ENCOUNTER — Other Ambulatory Visit (HOSPITAL_COMMUNITY): Payer: Medicare Other

## 2023-05-27 ENCOUNTER — Ambulatory Visit: Payer: Medicare Other | Admitting: Cardiovascular Disease

## 2023-06-03 ENCOUNTER — Telehealth: Payer: Self-pay | Admitting: Physician Assistant

## 2023-06-05 ENCOUNTER — Encounter: Payer: Self-pay | Admitting: *Deleted

## 2023-06-06 ENCOUNTER — Ambulatory Visit: Payer: Federal, State, Local not specified - PPO | Attending: Cardiovascular Disease | Admitting: Cardiovascular Disease

## 2023-06-06 ENCOUNTER — Encounter: Payer: Self-pay | Admitting: Cardiovascular Disease

## 2023-06-06 ENCOUNTER — Ambulatory Visit (HOSPITAL_BASED_OUTPATIENT_CLINIC_OR_DEPARTMENT_OTHER): Payer: Federal, State, Local not specified - PPO

## 2023-06-06 VITALS — BP 150/110 | HR 75 | Ht 75.5 in | Wt 160.8 lb

## 2023-06-06 DIAGNOSIS — I10 Essential (primary) hypertension: Secondary | ICD-10-CM | POA: Diagnosis not present

## 2023-06-06 DIAGNOSIS — I340 Nonrheumatic mitral (valve) insufficiency: Secondary | ICD-10-CM | POA: Insufficient documentation

## 2023-06-06 LAB — ECHOCARDIOGRAM COMPLETE
Area-P 1/2: 7.82 cm2
P 1/2 time: 633 ms
S' Lateral: 3 cm

## 2023-06-06 MED ORDER — LISINOPRIL 10 MG PO TABS: 10.0000 mg | ORAL_TABLET | Freq: Every day | ORAL | 3 refills | Status: DC

## 2023-06-06 NOTE — Progress Notes (Signed)
Cardiology Office Note:    Date:  06/06/2023   ID:  William Finner [name], DOB October 04, 1955, MRN [id number]  PCP:  [name], MD   [facility] Providers Cardiologist:  Tonny Bollman, MD     Referring MD: [name], MD   Chief Complaint  Patient presents with   Hypertension    History of Present Illness:    William ROGUS [name] is a 68 y.o. male presenting for follow-up of severe mitral regurgitation and hypertension.  The patient has a history of left lower lobectomy for non-small cell lung cancer.  He then developed radiographic findings of progressive lung cancer then was tried on chemotherapy but unable to tolerate it.  He is followed at the cancer center every 6 months.  He has been seen in the past for severe mitral valve prolapse with severe mitral regurgitation and we have elected to treat him conservatively in the context of his comorbid medical conditions and minimal symptoms.  The patient is here with his wife today.  He ran out of his lisinopril a few months ago and has not been taking it.  States that his home blood pressures have been running about 130 to 140 mmHg.  He is not sure of the diastolic readings.  He has a history of whitecoat hypertension per his report.  He complains of shortness of breath when the weather is cold.  He does not really appreciate any change in his breathing.  He denies orthopnea, PND, or chest pain.  No heart palpitations, lightheadedness, or syncope.  Current Medications: Current Meds  Medication Sig   lisinopril (ZESTRIL) 10 MG tablet Take 1 tablet (10 mg total) by mouth daily.   [DISCONTINUED] lisinopril (ZESTRIL) 10 MG tablet TAKE 1 TABLET BY MOUTH EVERY DAY     Allergies:   Tramadol   ROS:   Please see the history of present illness.    All other systems reviewed and are negative.  EKGs/Labs/Other Studies Reviewed:    The following studies were reviewed today: Cardiac Studies & Procedures    ______________________________________________________________________________________________ CARDIAC CATHETERIZATION  CARDIAC CATHETERIZATION 03/10/2020  Narrative 1. Patent coronary arteries with nonobstructive CAD, primarily affecting the mid-RCA. Widely patent LAD and LCx 2. Normal right heart pressures, including normal PCWP 3. Normal LVEDP  Recommend: further review with multidisciplinary heart team, considering transcatheter edge to edge mitral valve repair versus ongoing observation/med rx  Findings Coronary Findings Diagnostic  Dominance: Co-dominant  Left Main Vessel was injected. Vessel is normal in caliber. Vessel is angiographically normal. Short left main, no stenosis  Left Anterior Descending There is mild focal disease in the vessel. Large, wrap-around LAD with no significant stenosis Mid LAD lesion is 25% stenosed.  Ramus Intermedius Vessel is moderate in size. Vessel is angiographically normal.  Left Circumflex The vessel exhibits minimal luminal irregularities.  Right Coronary Artery Prox RCA to Mid RCA lesion is 50% stenosed. diffuse 50% mid-RCA stenosis  Intervention  No interventions have been documented.     ECHOCARDIOGRAM  ECHOCARDIOGRAM COMPLETE 11/24/2021  Narrative ECHOCARDIOGRAM REPORT    Patient Name:   William REIDINGER [name] Date of Exam: 11/24/2021 Medical Rec #:  [id number]         Height:       75.0 in Accession #:    [id number]        Weight:       157.0 lb Date of Birth:  Sep 02, 1955         BSA:  1.980 m Patient Age:    66 years          BP:           130/80 mmHg Patient Gender: M                 HR:           66 bpm. Exam Location:  [address]  Procedure: 2D Echo, 3D Echo, Cardiac Doppler and Color Doppler  Indications:    Mitral Valve Insufficiency  History:        Patient has prior history of Echocardiogram examinations, most recent 12/29/2020. Mitral Valve Prolapse; Risk Factors:Hypertension.  Sonographer:     Thurman Coyer RDCS Referring Phys: [id number] HEATHER E [place]  IMPRESSIONS   1. Left ventricular ejection fraction, by estimation, is 65 to 70%. Left ventricular ejection fraction by 3D volume is 66 %. The left ventricle has normal function. The left ventricle has no regional wall motion abnormalities. There is mild asymmetric left ventricular hypertrophy of the basal-septal segment. Left ventricular diastolic parameters are consistent with Grade I diastolic dysfunction (impaired relaxation). 2. Right ventricular systolic function is normal. The right ventricular size is normal. There is mildly elevated pulmonary artery systolic pressure. 3. Left atrial size was moderately dilated. 4. Very difficult to quantify degree of mitral regurgitation due to eccentricity of the jet. It appears at least moderate-to-severe. Could consider CMR or TEE for further work-up. The mitral valve is myxomatous. There is prolapse of both leaflets of the mitral valve. 5. The aortic valve is tricuspid. Aortic valve regurgitation is trivial. No aortic stenosis is present. 6. Aortic dilatation noted. 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 There is mild dilatation of the ascending aorta, measuring 41 mm. 7. The inferior vena cava is normal in size with greater than 50% respiratory variability, suggesting right atrial pressure of 3 mmHg.  Comparison(s): Compared to prior TTE, there continues to be at least moderate-to-severe MR from MVP. The PASP is now mildly elevated on current study. The aortic root remains dilated (previously 48mm; now 49mm).  FINDINGS Left Ventricle: Left ventricular ejection fraction, by estimation, is 65 to 70%. Left ventricular ejection fraction by 3D volume is 66 %. The left ventricle has normal function. The left ventricle has no regional wall motion abnormalities. The left ventricular internal cavity size was normal in size. There is mild asymmetric left  ventricular hypertrophy of the basal-septal segment. Left ventricular diastolic parameters are consistent with Grade I diastolic dysfunction (impaired relaxation).  Right Ventricle: The right ventricular size is normal. No increase in right ventricular wall thickness. Right ventricular systolic function is normal. There is mildly elevated pulmonary artery systolic pressure. The tricuspid regurgitant velocity is 2.91 m/s, and with an assumed right atrial pressure of 3 mmHg, the estimated right ventricular systolic pressure is 36.9 mmHg.  Left Atrium: Left atrial size was moderately dilated.  Right Atrium: Right atrial size was normal in size.  Pericardium: There is no evidence of pericardial effusion.  Mitral Valve: Very difficult to quantify degree of mitral regurgitation due to eccentricity of the jet. It appears at least moderate-to-severe. Could consider CMR or TEE for further work-up. The mitral valve is myxomatous. There is prolapse of both leaflets of the mitral valve.  Tricuspid Valve: The tricuspid valve is normal in structure. Tricuspid valve regurgitation is mild.  Aortic Valve: The aortic valve is tricuspid. Aortic valve regurgitation is trivial. No aortic stenosis is present.  Pulmonic Valve: The pulmonic  valve was normal in structure. Pulmonic valve regurgitation is trivial.  Aorta: Aortic dilatation noted. 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 There is mild dilatation of the ascending aorta, measuring 41 mm.  Venous: The inferior vena cava is normal in size with greater than 50% respiratory variability, suggesting right atrial pressure of 3 mmHg.  IAS/Shunts: The atrial septum is grossly normal.   LEFT VENTRICLE PLAX 2D LVIDd:         4.70 cm         Diastology LVIDs:         2.90 cm         LV e' medial:    6.48 cm/s LV PW:         1.00 cm         LV E/e' medial:  12.9 LV IVS:        1.20 cm         LV e' lateral:   12.00 cm/s LVOT  diam:     2.70 cm         LV E/e' lateral: 7.0 LV SV:         83 LV SV Index:   42 LVOT Area:     5.73 cm        3D Volume EF LV 3D EF:    Left ventricul ar ejection fraction by 3D volume is 66 %.  3D Volume EF: 3D EF:        66 % LV EDV:       178 ml LV ESV:       61 ml LV SV:        118 ml  RIGHT VENTRICLE RV Basal diam:  3.40 cm RV Mid diam:    3.00 cm RV S prime:     12.50 cm/s TAPSE (M-mode): 1.8 cm  LEFT ATRIUM             Index        RIGHT ATRIUM           Index LA diam:        3.20 cm 1.62 cm/m   RA Area:     15.00 cm LA Vol (A2C):   59.5 ml 30.05 ml/m  RA Volume:   36.80 ml  18.59 ml/m LA Vol (A4C):   61.3 ml 30.96 ml/m LA Biplane Vol: 64.5 ml 32.58 ml/m AORTIC VALVE LVOT Vmax:   88.05 cm/s LVOT Vmean:  49.800 cm/s LVOT VTI:    0.146 m  AORTA Ao Root diam: 4.90 cm Ao Asc diam:  4.10 cm  MITRAL VALVE                  TRICUSPID VALVE MV Area (PHT): 3.08 cm       TR Peak grad:   33.9 mmHg MV Decel Time: 246 msec       TR Vmax:        291.00 cm/s MR Peak grad:    117.5 mmHg MR Mean grad:    70.0 mmHg    SHUNTS MR Vmax:         542.00 cm/s  Systemic VTI:  0.15 m MR Vmean:        390.0 cm/s   Systemic Diam: 2.70 cm MR PISA:         9.05 cm MR PISA Eff ROA: 51 mm MR PISA Radius:  1.20 cm MV E velocity: 83.80 cm/s MV A velocity: 103.00 cm/s MV  E/A ratio:  0.81  Laurance Flatten MD Electronically signed by Laurance Flatten MD Signature Date/Time: 11/24/2021/11:26:13 AM    Final   TEE  ECHO TEE 10/07/2019  Narrative TRANSESOPHOGEAL ECHO REPORT    Patient Name:   William MIRSKY [name] Date of Exam: 10/07/2019 Medical Rec #:  [id number]         Height:       75.5 in Accession #:    [id number]        Weight:       159.0 lb Date of Birth:  [birth year]         BSA:          2.000 m Patient Age:    64 years          BP:           144/57 mmHg Patient Gender: M                 HR:           68 bpm. Exam Location:  Inpatient  Procedure:  Transesophageal Echo, Cardiac Doppler and Color Doppler  Indications:     Mitral regurgitation  History:         Patient has prior history of Echocardiogram examinations, most recent 08/18/2019. Mitral Valve Disease; Risk Factors:Former Smoker and Hypertension.  Sonographer:     Ross Ludwig RDCS (AE) Referring Phys:  [id number] Lars Masson Diagnosing Phys: Tobias Alexander MD  PROCEDURE: After discussion of the risks and benefits of a TEE, an informed consent was obtained from the patient. The transesophogeal probe was passed without difficulty through the esophogus of the patient. Local oropharyngeal anesthetic was provided with Cetacaine. Sedation performed by performing physician. Patients was under conscious sedation during this procedure. Anesthetic administered: of Fentanyl, 8mg  of Versed. Image quality was adequate. The patient's vital signs; including heart rate, blood pressure, and oxygen saturation; remained stable throughout the procedure. The patient developed no complications during the procedure.  IMPRESSIONS   1. 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 The jet is posteriorly directed, eccentric with reversal of pulmonary vein forward flow bilateraly. PISA radius 1.0 cm, ERO 0.45 cm2, regurgitation volume 58 ml. Posterior leaflet measures 1.0 cm. Flail gap 0.3 cm. 2. Left ventricular ejection fraction, by estimation, is 60 to 65%. The left ventricle has normal function. The left ventricle has no regional wall motion abnormalities. 3. Right ventricular systolic function is normal. The right ventricular size is normal. There is normal pulmonary artery systolic pressure. 4. Left atrial size was moderately dilated. No left atrial/left atrial appendage thrombus was detected. The LAA emptying velocity was 40 cm/s. 5. Right atrial size was mildly dilated. 6. The mitral valve is myxomatous. No  evidence of mitral valve regurgitation. No evidence of mitral stenosis. 7. Tricuspid valve regurgitation is moderate. 8. The aortic valve is normal in structure. Aortic valve regurgitation is trivial. No aortic stenosis is present. 9. There is moderate dilatation at the level of the sinuses of Valsalva measuring 48 mm. There is mild (Grade II) plaque. 10. The inferior vena cava is normal in size with greater than 50% respiratory variability, suggesting right atrial pressure of 3 mmHg.  Conclusion(s)/Recommendation(s): Normal biventricular function without evidence of hemodynamically significant valvular heart disease.  FINDINGS Left Ventricle: Left ventricular ejection fraction, by estimation, is 60 to 65%. The left ventricle has normal function. The left ventricle has no regional wall motion  abnormalities. The left ventricular internal cavity size was normal in size. There is no left ventricular hypertrophy.  Right Ventricle: The right ventricular size is normal. No increase in right ventricular wall thickness. Right ventricular systolic function is normal. There is normal pulmonary artery systolic pressure. The tricuspid regurgitant velocity is 2.37 m/s, and with an assumed right atrial pressure of 3 mmHg, the estimated right ventricular systolic pressure is 25.5 mmHg.  Left Atrium: Left atrial size was moderately dilated. No left atrial/left atrial appendage thrombus was detected. The LAA emptying velocity was 40 cm/s.  Right Atrium: Right atrial size was mildly dilated.  Pericardium: There is no evidence of pericardial effusion.  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 There is severe thickening of the mitral valve leaflet(s). Normal mobility of the mitral valve leaflets. No evidence of mitral valve regurgitation. No evidence of mitral valve stenosis.  Tricuspid Valve: The  tricuspid valve is normal in structure. Tricuspid valve regurgitation is moderate . No evidence of tricuspid stenosis.  Aortic Valve: The aortic valve is normal in structure. Aortic valve regurgitation is trivial. No aortic stenosis is present.  Pulmonic Valve: The pulmonic valve was normal in structure. Pulmonic valve regurgitation is not visualized. No evidence of pulmonic stenosis.  Aorta: The ascending aorta was not well visualized. There is moderate dilatation at the level of the sinuses of Valsalva measuring 48 mm. There is mild (Grade II) plaque.  Venous: The inferior vena cava is normal in size with greater than 50% respiratory variability, suggesting right atrial pressure of 3 mmHg.  IAS/Shunts: No atrial level shunt detected by color flow Doppler.   MR Peak grad:    117.5 mmHg  TRICUSPID VALVE MR Mean grad:    68.0 mmHg   TR Peak grad:   22.5 mmHg MR Vmax:         542.00 cm/s TR Vmax:        237.00 cm/s MR Vmean:        369.0 cm/s MR PISA:         6.28 cm MR PISA Eff ROA: 45 mm MR PISA Radius:  1.00 cm  Tobias Alexander MD Electronically signed by Tobias Alexander MD Signature Date/Time: 10/07/2019/1:31:59 PM    Final (Updated)        ______________________________________________________________________________________________      EKG:   EKG Interpretation Date/Time:  Thursday June 06 2023 15:43:56 EST Ventricular Rate:  75 PR Interval:  208 QRS Duration:  92 QT Interval:  382 QTC Calculation: 426 R Axis:   69  Text Interpretation: Normal sinus rhythm Minimal voltage criteria for LVH, may be normal variant ( Sokolow-Lyon ) Cannot rule out Anterior infarct , age undetermined When compared with ECG of 28-Dec-2020 13:52, PREVIOUS ECG IS PRESENTearly repolarization pattern is unchanged. Confirmed by Tonny Bollman [phone]) on 06/06/2023 3:56:08 PM    Recent Labs: 03/11/2023: ALT 23; BUN 12; Creatinine 1.46; Hemoglobin 17.4; Platelet Count 265; Potassium  4.1; Sodium 134  Recent Lipid Panel No results found for: "CHOL", "TRIG", "HDL", "CHOLHDL", "VLDL", "LDLCALC", "LDLDIRECT"        Physical Exam:    VS:  BP (!) 150/110 Comment: Left arm 154/110  Pulse 75   Ht 6' 3.5" (1.918 m)   Wt 160 lb 12.8 oz (72.9 kg)   SpO2 99%   BMI 19.83 kg/m     Wt Readings from Last 3 Encounters:  06/06/23 160 lb  12.8 oz (72.9 kg)  03/13/23 160 lb (72.6 kg)  09/12/22 167 lb 4.8 oz (75.9 kg)     GEN:  Well nourished, well developed in no acute distress HEENT: Normal NECK: No JVD; No carotid bruits LYMPHATICS: No lymphadenopathy CARDIAC: RRR, 2/6 mid systolic murmur at the apex RESPIRATORY:  Clear to auscultation without rales, wheezing or rhonchi  ABDOMEN: Soft, non-tender, non-distended MUSCULOSKELETAL:  No edema; No deformity  SKIN: Warm and dry NEUROLOGIC:  Alert and oriented x 3 PSYCHIATRIC:  Normal affect   Assessment & Plan Severe mitral regurgitation The patient had an echocardiogram just prior to his office visit.  I reviewed the images and he appears to have vigorous LV systolic function with no evidence of LV dilatation.  He has a myxomatous mitral valve with what appears to be only mild to moderate mitral regurgitation.  I compared this to his previous echo from last year and he had more significant mitral regurgitation previously.  He will be followed with continued surveillance with a repeat echocardiogram and office visit in 1 year.  He does not require intervention at this time. Essential hypertension Blood pressure is well above goal.  Combination of medication noncompliance and whitecoat hypertension.  I recommended starting back on lisinopril 10 mg daily.  He will pick this up from the pharmacy on his way home and I advised him to start it today.  He will continue to follow with primary care and I will plan to see him back in 1 year for follow-up with an echocardiogram.  I asked him to monitor his home readings and call and if his  systolic readings are greater than 140 mmHg or diastolic readings are greater than 90 mmHg.            Medication Adjustments/Labs and Tests Ordered: Current medicines are reviewed at length with the patient today.  Concerns regarding medicines are outlined above.  Orders Placed This Encounter  Procedures   EKG 12-Lead   ECHOCARDIOGRAM COMPLETE   Meds ordered this encounter  Medications   lisinopril (ZESTRIL) 10 MG tablet    Sig: Take 1 tablet (10 mg total) by mouth daily.    Dispense:  90 tablet    Refill:  3    Patient Instructions  Medication Instructions:  START TODAY Lisinopril  10 mg take one tablet daily   *If you need a refill on your cardiac medications before your next appointment, please call your pharmacy*  Testing/Procedures: Echo IN 1 YEAR   Your physician has requested that you have an echocardiogram. Echocardiography is a painless test that uses sound waves to create images of your heart. It provides your doctor with information about the size and shape of your heart and how well your heart's chambers and valves are working. This procedure takes approximately one hour. There are no restrictions for this procedure. Please do NOT wear cologne, perfume, aftershave, or lotions (deodorant is allowed). Please arrive 15 minutes prior to your appointment time.  Please note: We ask at that you not bring children with you during ultrasound (echo/ vascular) testing. Due to room size and safety concerns, children are not allowed in the ultrasound rooms during exams. Our front office staff cannot provide observation of children in our lobby area while testing is being conducted. An adult accompanying a patient to their appointment will only be allowed in the ultrasound room at the discretion of the ultrasound technician under special circumstances. We apologize for any inconvenience.    Follow-Up: At  [facility], you and your health needs are our priority.  As  part of our continuing mission to provide you with exceptional heart care, we have created designated Provider Care Teams.  These Care Teams include your primary Cardiologist (physician) and Advanced Practice Providers (APPs -  Physician Assistants and Nurse Practitioners) who all work together to provide you with the care you need, when you need it.  Your next appointment:   1 year(s)   Provider:   Tonny Bollman, MD     Other Instructions   1st Floor: - Lobby - Registration  - Pharmacy  - Lab - Cafe  2nd Floor: - PV Lab - Diagnostic Testing (echo, CT, nuclear med)  3rd Floor: - Vacant  4th Floor: - TCTS (cardiothoracic surgery) - AFib Clinic - Structural Heart Clinic - Vascular Surgery  - Vascular Ultrasound  5th Floor: - HeartCare Cardiology (general and EP) - Clinical Pharmacy for coumadin, hypertension, lipid, weight-loss medications, and med management appointments    Valet parking services will be available as well.        Signed, Tonny Bollman, MD  06/06/2023 5:48 PM    [facility]

## 2023-06-06 NOTE — Assessment & Plan Note (Signed)
The patient had an echocardiogram just prior to his office visit.  I reviewed the images and he appears to have vigorous LV systolic function with no evidence of LV dilatation.  He has a myxomatous mitral valve with what appears to be only mild to moderate mitral regurgitation.  I compared this to his previous echo from last year and he had more significant mitral regurgitation previously.  He will be followed with continued surveillance with a repeat echocardiogram and office visit in 1 year.  He does not require intervention at this time.

## 2023-06-06 NOTE — Patient Instructions (Addendum)
Medication Instructions:  START TODAY Lisinopril  10 mg take one tablet daily   *If you need a refill on your cardiac medications before your next appointment, please call your pharmacy*  Testing/Procedures: Echo IN 1 YEAR   Your physician has requested that you have an echocardiogram. Echocardiography is a painless test that uses sound waves to create images of your heart. It provides your doctor with information about the size and shape of your heart and how well your heart's chambers and valves are working. This procedure takes approximately one hour. There are no restrictions for this procedure. Please do NOT wear cologne, perfume, aftershave, or lotions (deodorant is allowed). Please arrive 15 minutes prior to your appointment time.  Please note: We ask at that you not bring children with you during ultrasound (echo/ vascular) testing. Due to room size and safety concerns, children are not allowed in the ultrasound rooms during exams. Our front office staff cannot provide observation of children in our lobby area while testing is being conducted. An adult accompanying a patient to their appointment will only be allowed in the ultrasound room at the discretion of the ultrasound technician under special circumstances. We apologize for any inconvenience.    Follow-Up: At [facility], you and your health needs are our priority.  As part of our continuing mission to provide you with exceptional heart care, we have created designated Provider Care Teams.  These Care Teams include your primary Cardiologist (physician) and Advanced Practice Providers (APPs -  Physician Assistants and Nurse Practitioners) who all work together to provide you with the care you need, when you need it.  Your next appointment:   1 year(s)   Provider:   Tonny Bollman, MD     Other Instructions   1st Floor: - Lobby - Registration  - Pharmacy  - Lab - Cafe  2nd Floor: - PV Lab - Diagnostic  Testing (echo, CT, nuclear med)  3rd Floor: - Vacant  4th Floor: - TCTS (cardiothoracic surgery) - AFib Clinic - Structural Heart Clinic - Vascular Surgery  - Vascular Ultrasound  5th Floor: - HeartCare Cardiology (general and EP) - Clinical Pharmacy for coumadin, hypertension, lipid, weight-loss medications, and med management appointments    Valet parking services will be available as well.

## 2023-06-10 ENCOUNTER — Inpatient Hospital Stay: Payer: Federal, State, Local not specified - PPO | Attending: Internal Medicine

## 2023-06-10 DIAGNOSIS — Z452 Encounter for adjustment and management of vascular access device: Secondary | ICD-10-CM | POA: Insufficient documentation

## 2023-06-10 DIAGNOSIS — Z95828 Presence of other vascular implants and grafts: Secondary | ICD-10-CM

## 2023-06-10 DIAGNOSIS — Z85118 Personal history of other malignant neoplasm of bronchus and lung: Secondary | ICD-10-CM | POA: Insufficient documentation

## 2023-06-10 MED ORDER — HEPARIN SOD (PORK) LOCK FLUSH 100 UNIT/ML IV SOLN
500.0000 [IU] | Freq: Once | INTRAVENOUS | Status: AC
  Administered 2023-06-10: 500 [IU]

## 2023-06-10 MED ORDER — SODIUM CHLORIDE 0.9% FLUSH
10.0000 mL | Freq: Once | INTRAVENOUS | Status: AC
  Administered 2023-06-10: 10 mL

## 2023-07-15 ENCOUNTER — Encounter: Payer: Self-pay | Admitting: Internal Medicine

## 2023-07-22 ENCOUNTER — Inpatient Hospital Stay: Payer: Medicare Other | Attending: Internal Medicine

## 2023-07-22 VITALS — BP 153/83 | HR 70 | Temp 98.4°F | Resp 18

## 2023-07-22 DIAGNOSIS — Z452 Encounter for adjustment and management of vascular access device: Secondary | ICD-10-CM | POA: Diagnosis present

## 2023-07-22 DIAGNOSIS — Z85118 Personal history of other malignant neoplasm of bronchus and lung: Secondary | ICD-10-CM | POA: Insufficient documentation

## 2023-07-22 DIAGNOSIS — Z95828 Presence of other vascular implants and grafts: Secondary | ICD-10-CM

## 2023-07-22 MED ORDER — HEPARIN SOD (PORK) LOCK FLUSH 100 UNIT/ML IV SOLN
500.0000 [IU] | Freq: Once | INTRAVENOUS | Status: AC
  Administered 2023-07-22: 500 [IU]

## 2023-07-22 MED ORDER — SODIUM CHLORIDE 0.9% FLUSH
10.0000 mL | Freq: Once | INTRAVENOUS | Status: AC
  Administered 2023-07-22: 10 mL

## 2023-08-28 ENCOUNTER — Ambulatory Visit (HOSPITAL_COMMUNITY)
Admission: RE | Admit: 2023-08-28 | Discharge: 2023-08-28 | Disposition: A | Discharge status: Home / Self Care | Source: Ambulatory Visit | Attending: Internal Medicine | Admitting: Internal Medicine

## 2023-08-28 ENCOUNTER — Encounter: Payer: Self-pay | Admitting: Internal Medicine

## 2023-08-28 ENCOUNTER — Other Ambulatory Visit: Payer: Self-pay | Admitting: Internal Medicine

## 2023-08-28 DIAGNOSIS — C3490 Malignant neoplasm of unspecified part of unspecified bronchus or lung: Secondary | ICD-10-CM | POA: Insufficient documentation

## 2023-09-02 ENCOUNTER — Telehealth: Payer: Self-pay | Admitting: Internal Medicine

## 2023-09-02 ENCOUNTER — Telehealth: Payer: Self-pay | Admitting: Medical Oncology

## 2023-09-02 NOTE — Telephone Encounter (Signed)
 Rescheduled appointments per the patients request. The patient is aware of the changes made.

## 2023-09-02 NOTE — Telephone Encounter (Signed)
 Please r/s pt appt . He said he was told his appt is wed 5/13. That date does not match the day.  Schedule message sent.

## 2023-09-03 ENCOUNTER — Inpatient Hospital Stay: Payer: Medicare Other

## 2023-09-03 ENCOUNTER — Inpatient Hospital Stay: Payer: Medicare Other | Admitting: Internal Medicine

## 2023-09-05 ENCOUNTER — Inpatient Hospital Stay: Attending: Internal Medicine

## 2023-09-05 ENCOUNTER — Inpatient Hospital Stay (HOSPITAL_BASED_OUTPATIENT_CLINIC_OR_DEPARTMENT_OTHER): Admitting: Internal Medicine

## 2023-09-05 VITALS — BP 140/74 | HR 96 | Temp 97.9°F | Resp 17 | Ht 75.5 in

## 2023-09-05 DIAGNOSIS — Z85118 Personal history of other malignant neoplasm of bronchus and lung: Secondary | ICD-10-CM | POA: Insufficient documentation

## 2023-09-05 DIAGNOSIS — C7951 Secondary malignant neoplasm of bone: Secondary | ICD-10-CM | POA: Insufficient documentation

## 2023-09-05 DIAGNOSIS — Z95828 Presence of other vascular implants and grafts: Secondary | ICD-10-CM

## 2023-09-05 DIAGNOSIS — Z79620 Long term (current) use of immunosuppressive biologic: Secondary | ICD-10-CM | POA: Insufficient documentation

## 2023-09-05 DIAGNOSIS — C3490 Malignant neoplasm of unspecified part of unspecified bronchus or lung: Secondary | ICD-10-CM | POA: Diagnosis not present

## 2023-09-05 LAB — CBC WITH DIFFERENTIAL (CANCER CENTER ONLY)
Abs Immature Granulocytes: 0.07 10*3/uL (ref 0.00–0.07)
Basophils Absolute: 0.1 10*3/uL (ref 0.0–0.1)
Basophils Relative: 1 %
Eosinophils Absolute: 0.4 10*3/uL (ref 0.0–0.5)
Eosinophils Relative: 3 %
HCT: 50.1 % (ref 39.0–52.0)
Hemoglobin: 17.3 g/dL — ABNORMAL HIGH (ref 13.0–17.0)
Immature Granulocytes: 1 %
Lymphocytes Relative: 10 %
Lymphs Abs: 1.1 10*3/uL (ref 0.7–4.0)
MCH: 30.6 pg (ref 26.0–34.0)
MCHC: 34.5 g/dL (ref 30.0–36.0)
MCV: 88.7 fL (ref 80.0–100.0)
Monocytes Absolute: 1 10*3/uL (ref 0.1–1.0)
Monocytes Relative: 9 %
Neutro Abs: 8.7 10*3/uL — ABNORMAL HIGH (ref 1.7–7.7)
Neutrophils Relative %: 76 %
Platelet Count: 265 10*3/uL (ref 150–400)
RBC: 5.65 MIL/uL (ref 4.22–5.81)
RDW: 13.4 % (ref 11.5–15.5)
WBC Count: 11.2 10*3/uL — ABNORMAL HIGH (ref 4.0–10.5)
nRBC: 0 % (ref 0.0–0.2)

## 2023-09-05 LAB — CMP (CANCER CENTER ONLY)
ALT: 24 U/L (ref 0–44)
AST: 28 U/L (ref 15–41)
Albumin: 4.1 g/dL (ref 3.5–5.0)
Alkaline Phosphatase: 81 U/L (ref 38–126)
Anion gap: 7 (ref 5–15)
BUN: 15 mg/dL (ref 8–23)
CO2: 22 mmol/L (ref 22–32)
Calcium: 9.2 mg/dL (ref 8.9–10.3)
Chloride: 102 mmol/L (ref 98–111)
Creatinine: 1.4 mg/dL — ABNORMAL HIGH (ref 0.61–1.24)
GFR, Estimated: 54 mL/min — ABNORMAL LOW (ref >60)
Glucose, Bld: 98 mg/dL (ref 70–99)
Potassium: 4.5 mmol/L (ref 3.5–5.1)
Sodium: 131 mmol/L — ABNORMAL LOW (ref 135–145)
Total Bilirubin: 0.6 mg/dL (ref 0.0–1.2)
Total Protein: 7.4 g/dL (ref 6.5–8.1)

## 2023-09-05 MED ORDER — HEPARIN SOD (PORK) LOCK FLUSH 100 UNIT/ML IV SOLN
500.0000 [IU] | Freq: Once | INTRAVENOUS | Status: AC
  Administered 2023-09-05: 500 [IU]

## 2023-09-05 MED ORDER — SODIUM CHLORIDE 0.9% FLUSH
10.0000 mL | Freq: Once | INTRAVENOUS | Status: AC
Start: 2023-09-05 — End: 2023-09-05
  Administered 2023-09-05: 10 mL

## 2023-09-05 NOTE — Progress Notes (Signed)
 [facility] [phone]   Fax:([phone]) [phone]  OFFICE PROGRESS NOTE  William Neighbors, MD [address] [place] [address]  DIAGNOSIS: Metastatic non-small cell lung cancer, adenocarcinoma initially diagnosed as stage IB (T2a, N0, M0) non-small cell lung cancer, adenocarcinoma diagnosed in December 2017.  The patient has evidence for disease recurrence in July 2019.  Biomarker Findings Tumor Mutational Burden - TMB-Intermediate (16 Muts/Mb) Microsatellite status - MS-Stable Genomic Findings For a complete list of the genes assayed, please refer to the Appendix. KIT amplification KRAS G12C SMAD4 splice site 1309-1G>T TP53 I232F 7 Disease relevant genes with no reportable alterations: EGFR, ALK, BRAF, MET, RET, ERBB2, ROS1   PDL 1 expression is 0%  PRIOR THERAPY:  1) status post left lower lobectomy as well as wedge resection of the left upper lobe on 05/11/2016. 2) Adjuvant systemic chemotherapy with cisplatin  75 MG/M2 and Alimta  500 MG/M2 every 3 weeks. First dose 07/03/2016. Status post 4 cycles. 3) Systemic chemotherapy with carboplatin  for AUC of 5, Alimta  500 mg/M2 and Keytruda  200 mg IV every 3 weeks.  Status post 47 cycles.  Starting from cycle #5 the patient will be treated with maintenance Alimta  and Ketruda (pembrolizumab ) every 3 weeks.  This treatment was discontinued secondary to disease progression. 4) Lumakras  (Sotorasib ) 960 mg p.o. daily started October 29, 2020.  Status post 1 months of treatment.  This treatment was discontinued secondary to liver toxicity even after dose reduction.  CURRENT THERAPY: Observation.  INTERVAL HISTORY: [name] 68 y.o. male turns to the clinic today for follow-up visit accompanied by his wife.Discussed the use of AI scribe software for clinical note transcription with the patient, who gave verbal consent to proceed.  History of Present Illness   [name] is a 68 year old male with  metastatic non-small cell lung cancer who presents for evaluation and restaging. He is accompanied by his wife.  He has a history of metastatic non-small cell lung cancer, adenocarcinoma type, initially diagnosed as stage 1B in December 2017, with disease recurrence noted in July 2019. His treatment history includes a left lower lobectomy followed by adjuvant systemic chemotherapy, systemic chemotherapy with carboplatin , Alimta , and Keytruda , maintenance treatment with Alimta  and Keytruda , and second-line treatment with Lumakras , which was discontinued in August 2022. Since then, he has been under observation.  No new symptoms such as chest pain, breathing issues, hemoptysis, headaches, or changes in vision. He denies recent weight loss, although he notes fluctuations in his weight.         MEDICAL HISTORY: Past Medical History:  Diagnosis Date   AAA (abdominal aortic aneurysm) (HCC)    AAA (abdominal aortic aneurysm) without rupture (HCC) 02/04/2014   Cancer (HCC)    lung   Encounter for antineoplastic chemotherapy 06/06/2016   History of hepatitis B    HNP (herniated nucleus pulposus), lumbar    L4 with radiculopathy   Hypertension    Lung cancer (HCC) 05/18/2016   Malignant neoplasm of lower lobe of left lung (HCC) 04/05/2016   Mass of left lung    Mitral insufficiency 04/06/2016   This patient will eventually need MV repair if his prognosis is good from oncology standpoint. However, his lung cancer therapy  is the priority right now. His cardiac condition won't preclude possible lung surgery.  Once his lung cancer is under control he will need a TEE to further evaluate his mitral valve anatomy and MR severity, and  also have ischemic workup as tere is evidence on calcificati   Renal insufficiency 10/09/2016   S/P partial lobectomy of lung 05/11/2016   Varicose veins of legs     ALLERGIES:  is allergic to tramadol.  MEDICATIONS:  Current Outpatient Medications  Medication Sig  Dispense Refill   lisinopril  (ZESTRIL ) 10 MG tablet Take 1 tablet (10 mg total) by mouth daily. 90 tablet 3   No current facility-administered medications for this visit.    SURGICAL HISTORY:  Past Surgical History:  Procedure Laterality Date   ABDOMINAL AORTIC ENDOVASCULAR STENT GRAFT N/A 03/12/2016   Procedure: ABDOMINAL AORTIC ENDOVASCULAR STENT GRAFT;  Surgeon: Arvil Lauber, MD;  Location: [facility] OR;  Service: Vascular;  Laterality: N/A;   COLONOSCOPY     INGUINAL HERNIA REPAIR Left 1975   Left inguinal hernia   INGUINAL HERNIA REPAIR Right    IR IMAGING GUIDED PORT INSERTION  09/04/2019   LOBECTOMY Left 05/11/2016   Procedure: LEFT LOWER LOBE LOBECTOMY AND LEFT UPPER LOBE  RESECTION AND PLACEMENT OF ON-Q;  Surgeon: Norita Beauvais, MD;  Location: [facility] OR;  Service: Thoracic;  Laterality: Left;   LUMBAR LAMINECTOMY  October 22, 2012   LYMPH NODE DISSECTION Left 05/11/2016   Procedure: LYMPH NODE DISSECTION;  Surgeon: Norita Beauvais, MD;  Location: [facility] OR;  Service: Thoracic;  Laterality: Left;   RIGHT/LEFT HEART CATH AND CORONARY ANGIOGRAPHY N/A 03/10/2020   Procedure: RIGHT/LEFT HEART CATH AND CORONARY ANGIOGRAPHY;  Surgeon: Arnoldo Lapping, MD;  Location: [facility] INVASIVE CV LAB;  Service: Cardiovascular;  Laterality: N/A;   STAPLING OF BLEBS Left 05/11/2016   Procedure: STAPLING OF APICAL BLEB;  Surgeon: Norita Beauvais, MD;  Location: [facility] OR;  Service: Thoracic;  Laterality: Left;   TEE WITHOUT CARDIOVERSION N/A 10/07/2019   Procedure: TRANSESOPHAGEAL ECHOCARDIOGRAM (TEE);  Surgeon: Liza Riggers, MD;  Location: [facility] ENDOSCOPY;  Service: Cardiovascular;  Laterality: N/A;   VIDEO ASSISTED THORACOSCOPY Left 05/18/2016   Procedure: VIDEO ASSISTED THORACOSCOPY WITH REMOVAL OF LEFT APICAL BLEB;  Surgeon: Norita Beauvais, MD;  Location: [facility] OR;  Service: Thoracic;  Laterality: Left;   VIDEO ASSISTED THORACOSCOPY (VATS)/WEDGE RESECTION Left 05/11/2016   Procedure: LEFT VIDEO ASSISTED THORACOSCOPY  (VATS);  Surgeon: Norita Beauvais, MD;  Location: [facility] OR;  Service: Thoracic;  Laterality: Left;   VIDEO BRONCHOSCOPY N/A 05/11/2016   Procedure: VIDEO BRONCHOSCOPY, LEFT LUNG;  Surgeon: Norita Beauvais, MD;  Location: [facility] OR;  Service: Thoracic;  Laterality: N/A;   VIDEO BRONCHOSCOPY N/A 05/18/2016   Procedure: VIDEO BRONCHOSCOPY WITH BRONCHIAL WASHING;  Surgeon: Norita Beauvais, MD;  Location: [facility] OR;  Service: Thoracic;  Laterality: N/A;    REVIEW OF SYSTEMS:  Constitutional: positive for fatigue Eyes: negative Ears, nose, mouth, throat, and face: negative Respiratory: negative Cardiovascular: negative Gastrointestinal: negative Genitourinary:negative Integument/breast: negative Hematologic/lymphatic: negative Musculoskeletal:negative Neurological: negative Behavioral/Psych: negative Endocrine: negative Allergic/Immunologic: negative   PHYSICAL EXAMINATION: General appearance: alert, cooperative, fatigued, and no distress Head: Normocephalic, without obvious abnormality, atraumatic Neck: no adenopathy, no JVD, supple, symmetrical, trachea midline, and thyroid  not enlarged, symmetric, no tenderness/mass/nodules Lymph nodes: Cervical, supraclavicular, and axillary nodes normal. Resp: clear to auscultation bilaterally Back: symmetric, no curvature. ROM normal. No CVA tenderness. Cardio: regular rate and rhythm, S1, S2 normal, no murmur, click, rub or gallop GI: soft, non-tender; bowel sounds normal; no masses,  no organomegaly Extremities: extremities normal, atraumatic, no cyanosis or edema Neurologic: Alert and oriented X 3, normal strength and tone. Normal symmetric reflexes. Normal coordination  and gait   ECOG PERFORMANCE STATUS: 1 - Symptomatic but completely ambulatory  Blood pressure (!) 140/74, pulse 96, temperature 97.9 F (36.6 C), resp. rate 17, height 6' 3.5" (1.918 m), SpO2 95%.  LABORATORY DATA: Lab Results  Component Value Date   WBC 11.2 (H) 09/05/2023   HGB  17.3 (H) 09/05/2023   HCT 50.1 09/05/2023   MCV 88.7 09/05/2023   PLT 265 09/05/2023      Chemistry      Component Value Date/Time   NA 131 (L) 09/05/2023 1057   NA 135 06/14/2021 1342   NA 138 01/11/2017 1343   K 4.5 09/05/2023 1057   K 4.7 01/11/2017 1343   CL 102 09/05/2023 1057   CO2 22 09/05/2023 1057   CO2 23 01/11/2017 1343   BUN 15 09/05/2023 1057   BUN 19 06/14/2021 1342   BUN 20.2 01/11/2017 1343   CREATININE 1.40 (H) 09/05/2023 1057   CREATININE 1.6 (H) 01/11/2017 1343      Component Value Date/Time   CALCIUM 9.2 09/05/2023 1057   CALCIUM 9.3 01/11/2017 1343   ALKPHOS 81 09/05/2023 1057   ALKPHOS 89 01/11/2017 1343   AST 28 09/05/2023 1057   AST 41 (H) 01/11/2017 1343   ALT 24 09/05/2023 1057   ALT 27 01/11/2017 1343   BILITOT 0.6 09/05/2023 1057   BILITOT 0.49 01/11/2017 1343       RADIOGRAPHIC STUDIES: CT CHEST ABDOMEN PELVIS WO CONTRAST Result Date: 08/29/2023 CLINICAL DATA:  Non-small cell lung cancer (NSCLC), staging EXAM: CT CHEST, ABDOMEN AND PELVIS WITHOUT CONTRAST TECHNIQUE: Multidetector CT imaging of the chest, abdomen and pelvis was performed following the standard protocol without IV contrast. RADIATION DOSE REDUCTION: This exam was performed according to the departmental dose-optimization program which includes automated exposure control, adjustment of the mA and/or kV according to patient size and/or use of iterative reconstruction technique. COMPARISON:  CT chest abdomen and pelvis 03/13/2023, 09/11/2022. FINDINGS: CT CHEST FINDINGS Cardiovascular: Mild cardiomegaly without pericardial effusion. Right chest Port-A-Cath with the distal tip in the superior cavoatrial junction. Mild thoracic atherosclerosis. Mediastinum/Nodes: No enlarged mediastinal, hilar, or axillary lymph nodes. Thyroid  gland, trachea, and esophagus demonstrate no significant findings. Lungs/Pleura: Severe emphysematous changes. Prior left lower lobectomy and left upper lobe wedge  resection. Stable 6 mm right lung base pulmonary nodule. Calcified right pleural plaque. No pleural effusion, pneumothorax, or new/suspicious pulmonary nodule. Musculoskeletal: No chest wall mass or suspicious bone lesions identified. CT ABDOMEN PELVIS FINDINGS Hepatobiliary: Normal liver size and contour with diffuse heterogeneity likely reflecting underlying steatosis. Normal gallbladder. No biliary ductal dilation. Pancreas: Unremarkable. No pancreatic ductal dilatation or surrounding inflammatory changes. Spleen: Normal in size without focal abnormality. Adrenals/Urinary Tract: Adrenal glands are unremarkable. Kidneys are normal, without renal calculi, focal lesion, or hydronephrosis. Bladder is unremarkable. Stomach/Bowel: Stomach is within normal limits. Appendix appears normal. No evidence of bowel wall thickening, distention, or inflammatory changes. Vascular/Lymphatic: Prior aorto bi-iliac stents. No enlarged abdominal or pelvic lymph nodes. Reproductive: Prostatomegaly. Other: No abdominal wall hernia or abnormality. No abdominopelvic ascites. Musculoskeletal: No acute or significant osseous findings. IMPRESSION: 1. Prior left lower lobectomy and left upper lobe wedge resection. 2. No evidence of metastatic disease in the chest, abdomen or pelvis. 3. Additional nonacute findings as above. Electronically Signed   By: Rox Cope M.D.   On: 08/29/2023 10:00    ASSESSMENT AND PLAN:  This is a very pleasant 68 years old white male with a stage IB non-small cell lung cancer, adenocarcinoma status  post wedge resection of the left upper lobe followed by 4 cycles of adjuvant systemic chemotherapy with cisplatin  and Alimta  and he tolerated his treatment well except for fatigue. The patient has been on observation since June 2018.   The recent imaging studies including CT scan of the chest as well as a PET scan showed evidence for disease recurrence with metastatic disease presented with bilateral  pulmonary nodules as well as destructive bone lesions at T4 vertebral body, biopsy proven to be metastatic adenocarcinoma. Molecular studies by foundation 1 showed KRAS G12C mutation.  PDL 1 expression was negative. The patient was treated with carboplatin , Alimta  and Ketruda (pembrolizumab ) status post 47 cycles.  Starting from cycle #5 he is on maintenance treatment with Alimta  and Keytruda  every 3 weeks. He has been on treatment with single agent Keytruda  recently because of the renal insufficiency.  The patient has been tolerating this treatment well with no concerning complaints.  This treatment was discontinued secondary to disease progression. He started second line treatment with Lumakras  (Sotorasib ) 960 mg p.o. daily on October 29, 2020. Unfortunately the patient had significant liver dysfunction secondary to the treatment with Lumakras  (Sotorasib ).  Status post 1 months.  He was treated with a tapered dose of prednisone  for liver toxicity.  We resumed his treatment with reduced dose Lumakras  (Sotorasib ) at 480 mg p.o. daily but again the patient developed significant liver dysfunction within few days of resuming the treatment.  His treatment was discontinued and the patient was treated with a tapered dose of prednisone . The patient is currently on observation for the last 3 years. He had repeat CT scan of the chest, abdomen and pelvis performed recently.  I personally independently reviewed the scan and discussed the result with the patient and his wife. His scan showed no concerning findings for disease progression.    Metastatic non-small cell lung cancer Metastatic non-small cell lung cancer, adenocarcinoma subtype, initially diagnosed as stage IB in December 2017 with recurrence in July 2019. Previously treated with left lower lobectomy, adjuvant systemic chemotherapy, systemic chemotherapy with carboplatin , Alimta , and Keytruda , followed by maintenance with Alimta  and Keytruda , and second-line  treatment with Lumakras . Lumakras  was discontinued in August 2022, and he has been on observation since. Recent CT scan of the chest, abdomen, and pelvis shows static disease with no evidence of metastasis. No new symptoms such as chest pain, dyspnea, hemoptysis, headache, or vision changes. No recent weight loss. Overall, the disease is well-managed on observation. - Continue observation with repeat CT scan of the chest, abdomen, and pelvis every six months. - Maintain port flushing every two months.   The patient was advised to call immediately if he has any other concerning symptoms in the interval. The patient voices understanding of current disease status and treatment options and is in agreement with the current care plan. All questions were answered. The patient knows to call the clinic with any problems, questions or concerns. We can certainly see the patient much sooner if necessary. The total time spent in the appointment was 30 minutes.  Disclaimer: This note was dictated with voice recognition software. Similar sounding words can inadvertently be transcribed and may be missed upon review. Aurelio Blower, MD 09/05/23

## 2023-10-14 ENCOUNTER — Inpatient Hospital Stay: Attending: Internal Medicine

## 2023-10-14 VITALS — BP 148/83 | HR 85 | Temp 98.2°F | Resp 18

## 2023-10-14 DIAGNOSIS — Z85118 Personal history of other malignant neoplasm of bronchus and lung: Secondary | ICD-10-CM | POA: Diagnosis not present

## 2023-10-14 DIAGNOSIS — Z452 Encounter for adjustment and management of vascular access device: Secondary | ICD-10-CM | POA: Diagnosis present

## 2023-10-14 DIAGNOSIS — C7951 Secondary malignant neoplasm of bone: Secondary | ICD-10-CM | POA: Diagnosis present

## 2023-10-14 DIAGNOSIS — Z79620 Long term (current) use of immunosuppressive biologic: Secondary | ICD-10-CM | POA: Insufficient documentation

## 2023-10-14 DIAGNOSIS — Z95828 Presence of other vascular implants and grafts: Secondary | ICD-10-CM

## 2023-10-14 MED ORDER — SODIUM CHLORIDE 0.9% FLUSH
10.0000 mL | Freq: Once | INTRAVENOUS | Status: AC
  Administered 2023-10-14: 10 mL

## 2023-10-14 MED ORDER — HEPARIN SOD (PORK) LOCK FLUSH 100 UNIT/ML IV SOLN
500.0000 [IU] | Freq: Once | INTRAVENOUS | Status: AC
  Administered 2023-10-14: 500 [IU]

## 2023-11-25 ENCOUNTER — Inpatient Hospital Stay

## 2024-01-06 ENCOUNTER — Inpatient Hospital Stay: Attending: Internal Medicine

## 2024-01-06 VITALS — BP 153/78 | HR 74 | Temp 98.0°F | Resp 16

## 2024-01-06 DIAGNOSIS — Z95828 Presence of other vascular implants and grafts: Secondary | ICD-10-CM

## 2024-01-06 MED ORDER — SODIUM CHLORIDE 0.9% FLUSH
10.0000 mL | Freq: Once | INTRAVENOUS | Status: AC
  Administered 2024-01-06: 10 mL

## 2024-02-17 ENCOUNTER — Inpatient Hospital Stay

## 2024-02-24 ENCOUNTER — Ambulatory Visit (HOSPITAL_COMMUNITY)
Admission: RE | Admit: 2024-02-24 | Discharge: 2024-02-24 | Disposition: A | Discharge status: Home / Self Care | Source: Ambulatory Visit | Attending: Internal Medicine | Admitting: Internal Medicine

## 2024-02-24 ENCOUNTER — Inpatient Hospital Stay: Attending: Internal Medicine

## 2024-02-24 ENCOUNTER — Encounter (HOSPITAL_COMMUNITY): Payer: Self-pay

## 2024-02-24 DIAGNOSIS — Z9221 Personal history of antineoplastic chemotherapy: Secondary | ICD-10-CM | POA: Insufficient documentation

## 2024-02-24 DIAGNOSIS — Z85118 Personal history of other malignant neoplasm of bronchus and lung: Secondary | ICD-10-CM | POA: Insufficient documentation

## 2024-02-24 DIAGNOSIS — C3490 Malignant neoplasm of unspecified part of unspecified bronchus or lung: Secondary | ICD-10-CM | POA: Insufficient documentation

## 2024-02-24 LAB — CMP (CANCER CENTER ONLY)
ALT: 27 U/L (ref 0–44)
AST: 31 U/L (ref 15–41)
Albumin: 3.9 g/dL (ref 3.5–5.0)
Alkaline Phosphatase: 84 U/L (ref 38–126)
Anion gap: 7 (ref 5–15)
BUN: 17 mg/dL (ref 8–23)
CO2: 23 mmol/L (ref 22–32)
Calcium: 9 mg/dL (ref 8.9–10.3)
Chloride: 100 mmol/L (ref 98–111)
Creatinine: 1.41 mg/dL — ABNORMAL HIGH (ref 0.61–1.24)
GFR, Estimated: 54 mL/min — ABNORMAL LOW (ref >60)
Glucose, Bld: 92 mg/dL (ref 70–99)
Potassium: 4.5 mmol/L (ref 3.5–5.1)
Sodium: 130 mmol/L — ABNORMAL LOW (ref 135–145)
Total Bilirubin: 0.8 mg/dL (ref 0.0–1.2)
Total Protein: 7.1 g/dL (ref 6.5–8.1)

## 2024-02-24 LAB — CBC WITH DIFFERENTIAL (CANCER CENTER ONLY)
Abs Immature Granulocytes: 0.03 K/uL (ref 0.00–0.07)
Basophils Absolute: 0.1 K/uL (ref 0.0–0.1)
Basophils Relative: 1 %
Eosinophils Absolute: 0.5 K/uL (ref 0.0–0.5)
Eosinophils Relative: 5 %
HCT: 49.3 % (ref 39.0–52.0)
Hemoglobin: 17.2 g/dL — ABNORMAL HIGH (ref 13.0–17.0)
Immature Granulocytes: 0 %
Lymphocytes Relative: 13 %
Lymphs Abs: 1.3 K/uL (ref 0.7–4.0)
MCH: 31 pg (ref 26.0–34.0)
MCHC: 34.9 g/dL (ref 30.0–36.0)
MCV: 88.8 fL (ref 80.0–100.0)
Monocytes Absolute: 1 K/uL (ref 0.1–1.0)
Monocytes Relative: 10 %
Neutro Abs: 7.5 K/uL (ref 1.7–7.7)
Neutrophils Relative %: 71 %
Platelet Count: 222 K/uL (ref 150–400)
RBC: 5.55 MIL/uL (ref 4.22–5.81)
RDW: 13.2 % (ref 11.5–15.5)
WBC Count: 10.4 K/uL (ref 4.0–10.5)
nRBC: 0 % (ref 0.0–0.2)

## 2024-02-24 MED ORDER — HEPARIN SOD (PORK) LOCK FLUSH 100 UNIT/ML IV SOLN
INTRAVENOUS | Status: AC
  Filled 2024-02-24: qty 5

## 2024-02-24 MED ORDER — HEPARIN SOD (PORK) LOCK FLUSH 100 UNIT/ML IV SOLN
500.0000 [IU] | Freq: Once | INTRAVENOUS | Status: AC
  Administered 2024-02-24: 500 [IU] via INTRAVENOUS

## 2024-03-02 ENCOUNTER — Inpatient Hospital Stay: Admitting: Internal Medicine

## 2024-03-02 VITALS — BP 151/80 | HR 88 | Temp 98.0°F | Resp 17 | Ht 75.5 in | Wt 161.2 lb

## 2024-03-02 DIAGNOSIS — C3490 Malignant neoplasm of unspecified part of unspecified bronchus or lung: Secondary | ICD-10-CM

## 2024-03-02 NOTE — Progress Notes (Signed)
 [facility] Telephone:([phone]) [phone]   [phone]  OFFICE PROGRESS NOTE  [name], MD [address], [address] [place] [address]  DIAGNOSIS: Metastatic non-small cell lung cancer, adenocarcinoma initially diagnosed as stage IB (T2a, N0, M0) non-small cell lung cancer, adenocarcinoma diagnosed in December 2017.  The patient has evidence for disease recurrence in July 2019.  Biomarker Findings Tumor Mutational Burden - TMB-Intermediate (16 Muts/Mb) Microsatellite status - MS-Stable Genomic Findings For a complete list of the genes assayed, please refer to the Appendix. KIT amplification KRAS G12C SMAD4 splice site 1309-1G>T TP53 I232F 7 Disease relevant genes with no reportable alterations: EGFR, ALK, BRAF, MET, RET, ERBB2, ROS1   PDL 1 expression is 0%  PRIOR THERAPY:  1) status post left lower lobectomy as well as wedge resection of the left upper lobe on 05/11/2016. 2) Adjuvant systemic chemotherapy with cisplatin  75 MG/M2 and Alimta  500 MG/M2 every 3 weeks. First dose 07/03/2016. Status post 4 cycles. 3) Systemic chemotherapy with carboplatin  for AUC of 5, Alimta  500 mg/M2 and Keytruda  200 mg IV every 3 weeks.  Status post 47 cycles.  Starting from cycle #5 the patient will be treated with maintenance Alimta  and Ketruda (pembrolizumab ) every 3 weeks.  This treatment was discontinued secondary to disease progression. 4) Lumakras  (Sotorasib ) 960 mg p.o. daily started October 29, 2020.  Status post 1 months of treatment.  This treatment was discontinued secondary to liver toxicity even after dose reduction.  CURRENT THERAPY: Observation.  INTERVAL HISTORY: [name] 68 y.o. male turns to the clinic today for follow-up visit accompanied by his wife.Discussed the use of AI scribe software for clinical note transcription with the patient, who gave verbal consent to proceed.  History of Present Illness [name] is a 68 year old male with  metastatic non-small cell lung cancer who presents for evaluation and repeat CT scan for restaging of his disease. He is accompanied by his wife.  He has a history of metastatic non-small cell lung cancer, initially diagnosed as stage 1B in December 2017, with recurrence noted in July 2019. Molecular studies revealed a KRAS G12C mutation and PD-L1 expression of zero percent.  He underwent systemic chemotherapy with carboplatin , Alimta , and Keytruda  for the recurrent disease, initially for four cycles, followed by maintenance treatment with Alimta  and Keytruda . This regimen was discontinued after 47 cycles.  He was treated with Lomacras for one month, but this was discontinued due to liver toxicity, even after dose reduction. He is currently under observation.  No current symptoms such as chest pain, breathing issues, nausea, vomiting, diarrhea, headaches, or changes in vision. He notes that his weight fluctuates but has not experienced significant weight loss.    MEDICAL HISTORY: Past Medical History:  Diagnosis Date   AAA (abdominal aortic aneurysm)    AAA (abdominal aortic aneurysm) without rupture 02/04/2014   Cancer (HCC)    lung   Encounter for antineoplastic chemotherapy 06/06/2016   History of hepatitis B    HNP (herniated nucleus pulposus), lumbar    L4 with radiculopathy   Hypertension    Lung cancer (HCC) 05/18/2016   Malignant neoplasm of lower lobe of left lung (HCC) 04/05/2016   Mass of left lung    Mitral insufficiency 04/06/2016   This patient will eventually need MV repair if his prognosis is good from oncology standpoint. However, his lung cancer therapy  is the priority right now. His cardiac condition won't preclude possible  lung surgery.  Once his lung cancer is under control he will need a TEE to further evaluate his mitral valve anatomy and MR severity, and also have ischemic workup as tere is evidence on calcificati   Renal insufficiency 10/09/2016   S/P partial  lobectomy of lung 05/11/2016   Varicose veins of legs     ALLERGIES:  is allergic to tramadol.  MEDICATIONS:  Current Outpatient Medications  Medication Sig Dispense Refill   lisinopril  (ZESTRIL ) 10 MG tablet Take 1 tablet (10 mg total) by mouth daily. 90 tablet 3   No current facility-administered medications for this visit.    SURGICAL HISTORY:  Past Surgical History:  Procedure Laterality Date   ABDOMINAL AORTIC ENDOVASCULAR STENT GRAFT N/A 03/12/2016   Procedure: ABDOMINAL AORTIC ENDOVASCULAR STENT GRAFT;  Surgeon: Redell LITTIE Door, MD;  Location: [facility] OR;  Service: Vascular;  Laterality: N/A;   COLONOSCOPY     INGUINAL HERNIA REPAIR Left 1975   Left inguinal hernia   INGUINAL HERNIA REPAIR Right    IR IMAGING GUIDED PORT INSERTION  09/04/2019   LOBECTOMY Left 05/11/2016   Procedure: LEFT LOWER LOBE LOBECTOMY AND LEFT UPPER LOBE  RESECTION AND PLACEMENT OF ON-Q;  Surgeon: Dallas KATHEE Jude, MD;  Location: [facility] OR;  Service: Thoracic;  Laterality: Left;   LUMBAR LAMINECTOMY  October 22, 2012   LYMPH NODE DISSECTION Left 05/11/2016   Procedure: LYMPH NODE DISSECTION;  Surgeon: Dallas KATHEE Jude, MD;  Location: [facility] OR;  Service: Thoracic;  Laterality: Left;   RIGHT/LEFT HEART CATH AND CORONARY ANGIOGRAPHY N/A 03/10/2020   Procedure: RIGHT/LEFT HEART CATH AND CORONARY ANGIOGRAPHY;  Surgeon: Wonda Sharper, MD;  Location: [facility] INVASIVE CV LAB;  Service: Cardiovascular;  Laterality: N/A;   STAPLING OF BLEBS Left 05/11/2016   Procedure: STAPLING OF APICAL BLEB;  Surgeon: Dallas KATHEE Jude, MD;  Location: [facility] OR;  Service: Thoracic;  Laterality: Left;   TEE WITHOUT CARDIOVERSION N/A 10/07/2019   Procedure: TRANSESOPHAGEAL ECHOCARDIOGRAM (TEE);  Surgeon: Maranda Leim DEL, MD;  Location: [facility] ENDOSCOPY;  Service: Cardiovascular;  Laterality: N/A;   VIDEO ASSISTED THORACOSCOPY Left 05/18/2016   Procedure: VIDEO ASSISTED THORACOSCOPY WITH REMOVAL OF LEFT APICAL BLEB;  Surgeon: Dallas KATHEE Jude, MD;  Location:  [facility] OR;  Service: Thoracic;  Laterality: Left;   VIDEO ASSISTED THORACOSCOPY (VATS)/WEDGE RESECTION Left 05/11/2016   Procedure: LEFT VIDEO ASSISTED THORACOSCOPY (VATS);  Surgeon: Dallas KATHEE Jude, MD;  Location: [facility] OR;  Service: Thoracic;  Laterality: Left;   VIDEO BRONCHOSCOPY N/A 05/11/2016   Procedure: VIDEO BRONCHOSCOPY, LEFT LUNG;  Surgeon: Dallas KATHEE Jude, MD;  Location: [facility] OR;  Service: Thoracic;  Laterality: N/A;   VIDEO BRONCHOSCOPY N/A 05/18/2016   Procedure: VIDEO BRONCHOSCOPY WITH BRONCHIAL WASHING;  Surgeon: Dallas KATHEE Jude, MD;  Location: [facility] OR;  Service: Thoracic;  Laterality: N/A;    REVIEW OF SYSTEMS:  Constitutional: positive for fatigue Eyes: negative Ears, nose, mouth, throat, and face: negative Respiratory: negative Cardiovascular: negative Gastrointestinal: negative Genitourinary:negative Integument/breast: negative Hematologic/lymphatic: negative Musculoskeletal:negative Neurological: negative Behavioral/Psych: negative Endocrine: negative Allergic/Immunologic: negative   PHYSICAL EXAMINATION: General appearance: alert, cooperative, fatigued, and no distress Head: Normocephalic, without obvious abnormality, atraumatic Neck: no adenopathy, no JVD, supple, symmetrical, trachea midline, and thyroid  not enlarged, symmetric, no tenderness/mass/nodules Lymph nodes: Cervical, supraclavicular, and axillary nodes normal. Resp: clear to auscultation bilaterally Back: symmetric, no curvature. ROM normal. No CVA tenderness. Cardio: regular rate and rhythm, S1, S2 normal, no murmur, click, rub or gallop GI: soft, non-tender; bowel sounds normal; no masses,  no organomegaly Extremities: extremities normal, atraumatic, no cyanosis or edema Neurologic: Alert and oriented X 3, normal strength and tone. Normal symmetric reflexes. Normal coordination and gait   ECOG PERFORMANCE STATUS: 1 - Symptomatic but completely ambulatory  Blood pressure (!) 151/80, pulse 88,  temperature 98 F (36.7 C), temperature source Temporal, resp. rate 17, height 6' 3.5 (1.918 m), weight 161 lb 3.2 oz (73.1 kg), SpO2 96%.  LABORATORY DATA: Lab Results  Component Value Date   WBC 10.4 02/24/2024   HGB 17.2 (H) 02/24/2024   HCT 49.3 02/24/2024   MCV 88.8 02/24/2024   PLT 222 02/24/2024      Chemistry      Component Value Date/Time   NA 130 (L) 02/24/2024 0943   NA 135 06/14/2021 1342   NA 138 01/11/2017 1343   K 4.5 02/24/2024 0943   K 4.7 01/11/2017 1343   CL 100 02/24/2024 0943   CO2 23 02/24/2024 0943   CO2 23 01/11/2017 1343   BUN 17 02/24/2024 0943   BUN 19 06/14/2021 1342   BUN 20.2 01/11/2017 1343   CREATININE 1.41 (H) 02/24/2024 0943   CREATININE 1.6 (H) 01/11/2017 1343      Component Value Date/Time   CALCIUM 9.0 02/24/2024 0943   CALCIUM 9.3 01/11/2017 1343   ALKPHOS 84 02/24/2024 0943   ALKPHOS 89 01/11/2017 1343   AST 31 02/24/2024 0943   AST 41 (H) 01/11/2017 1343   ALT 27 02/24/2024 0943   ALT 27 01/11/2017 1343   BILITOT 0.8 02/24/2024 0943   BILITOT 0.49 01/11/2017 1343       RADIOGRAPHIC STUDIES: CT Chest Wo Contrast Result Date: 02/29/2024 EXAM: CT CHEST, ABDOMEN AND PELVIS WITHOUT CONTRAST 02/24/2024 10:52:12 AM TECHNIQUE: CT of the chest, abdomen and pelvis was performed without the administration of intravenous contrast. Multiplanar reformatted images are provided for review. Automated exposure control, iterative reconstruction, and/or weight based adjustment of the mA/kV was utilized to reduce the radiation dose to as low as reasonably achievable. COMPARISON: CT Chest Abdomen Pelvis 08/28/2023; CT Chest 09/10/2022. CLINICAL HISTORY: Non-small cell lung cancer. FINDINGS: CHEST: MEDIASTINUM AND LYMPH NODES: The heart is normal in size. No pericardial effusion. Stable tortuosity, ectasia, and calcification of the thoracic aorta. Stable 3-vessel coronary artery calcifications. The central airways are clear. No mediastinal or hilar  mass or lymphadenopathy. Stable soft tissue nodule in the anterior mediastinum with a somewhat triangular shape, likely residual thymic tissue. The esophagus is unremarkable and stable. Unchanged 10 mm left thyroid  nodule not require any further imaging evaluation or follow-up. LUNGS AND PLEURA: Stable surgical changes involving the left hemithorax. Stable advanced emphysematous changes and pulmonary scarring. No acute pulmonary process. The central tracheobronchial tree is unremarkable. No worrisome pulmonary lesions. Stable 6 mm nodule at the right lung base. This is unchanged since 2022 and considered benign. Mass-like area of pleural thickening at the left lung base laterally. This has slowly enlarged over the past several CT scans. It measures a maximum of 6.7 x 3.5 cm and on the most recent study from May, measured 5.3 x 2.6 cm. Findings worrisome for pleural tumor. Recommend PET/CT or biopsy for further evaluation. Stable calcified pleura on the right side. Stable right sided port-a-cath. No pleural effusion or pneumothorax. ABDOMEN AND PELVIS: LIVER: Very heterogeneous appearance of the liver parenchyma with progressive appearing areas of low attenuation possibly reflecting geographic fatty infiltration. These findings appear progressive since prior CT scans and cannot exclude the possibility of metastatic disease. This could also be  evaluated with PET CT or MRI abdomen if the patient can receive contrast. Few small rounded areas of low attenuation are likely benign cysts. No intrahepatic biliary dilatation. GALLBLADDER AND BILE DUCTS: The gallbladder is unremarkable. No common bile duct dilatation. SPLEEN: The spleen is normal in size. No splenic lesions. PANCREAS: No pancreatic mass or inflammation. ADRENAL GLANDS: The adrenal glands are normal. KIDNEYS, URETERS AND BLADDER: No worrisome renal lesions are identified without contrast. No stones in the kidneys or ureters. No hydronephrosis. No perinephric or  periureteral stranding. The bladder is grossly normal. GI AND BOWEL: The duodenum, small bowel, and colon are grossly normal. No inflammatory changes or obstructive findings. The appendix is normal. REPRODUCTIVE ORGANS: No acute abnormality. PERITONEUM AND RETROPERITONEUM: No ascites. No free air. VASCULATURE: Stable aortoiliac stent graft. No complicating features. Stable advanced vascular disease. ABDOMINAL AND PELVIS LYMPH NODES: No abdominal or pelvic lymphadenopathy. No pelvic or inguinal adenopathy. BONES AND SOFT TISSUES: No chest wall mass, supraclavicular or axillary adenopathy. The bony thorax is intact. The bony structures of the abdomen and pelvis are unremarkable. No lytic or sclerotic bone lesions. Stable advanced degenerative disc disease at L4-L5. No focal soft tissue abnormality. IMPRESSION: 1. Progressive mass-like pleural thickening at the left lung base measuring 6.7 x 3.5 cm, suspicious for pleural tumor. Recommend PET/CT or biopsy for further evaluation. 2. Progressive heterogeneous hepatic parenchyma with increasing low-attenuation areas, indeterminate for metastatic disease geographic fatty infiltration. Recommend PET/CT or contrast-enhanced MRI of the abdomen if feasible. 3. Stable 6 mm pulmonary nodule at the right lung base. Electronically signed by: Maude Stammer MD 02/29/2024 10:44 AM EST RP Workstation: [id number]   CT ABDOMEN PELVIS WO CONTRAST Result Date: 02/29/2024 EXAM: CT CHEST, ABDOMEN AND PELVIS WITHOUT CONTRAST 02/24/2024 10:52:12 AM TECHNIQUE: CT of the chest, abdomen and pelvis was performed without the administration of intravenous contrast. Multiplanar reformatted images are provided for review. Automated exposure control, iterative reconstruction, and/or weight based adjustment of the mA/kV was utilized to reduce the radiation dose to as low as reasonably achievable. COMPARISON: CT Chest Abdomen Pelvis 08/28/2023; CT Chest 09/10/2022. CLINICAL HISTORY: Non-small cell  lung cancer. FINDINGS: CHEST: MEDIASTINUM AND LYMPH NODES: The heart is normal in size. No pericardial effusion. Stable tortuosity, ectasia, and calcification of the thoracic aorta. Stable 3-vessel coronary artery calcifications. The central airways are clear. No mediastinal or hilar mass or lymphadenopathy. Stable soft tissue nodule in the anterior mediastinum with a somewhat triangular shape, likely residual thymic tissue. The esophagus is unremarkable and stable. Unchanged 10 mm left thyroid  nodule not require any further imaging evaluation or follow-up. LUNGS AND PLEURA: Stable surgical changes involving the left hemithorax. Stable advanced emphysematous changes and pulmonary scarring. No acute pulmonary process. The central tracheobronchial tree is unremarkable. No worrisome pulmonary lesions. Stable 6 mm nodule at the right lung base. This is unchanged since 2022 and considered benign. Mass-like area of pleural thickening at the left lung base laterally. This has slowly enlarged over the past several CT scans. It measures a maximum of 6.7 x 3.5 cm and on the most recent study from May, measured 5.3 x 2.6 cm. Findings worrisome for pleural tumor. Recommend PET/CT or biopsy for further evaluation. Stable calcified pleura on the right side. Stable right sided port-a-cath. No pleural effusion or pneumothorax. ABDOMEN AND PELVIS: LIVER: Very heterogeneous appearance of the liver parenchyma with progressive appearing areas of low attenuation possibly reflecting geographic fatty infiltration. These findings appear progressive since prior CT scans and cannot exclude the possibility of metastatic  disease. This could also be evaluated with PET CT or MRI abdomen if the patient can receive contrast. Few small rounded areas of low attenuation are likely benign cysts. No intrahepatic biliary dilatation. GALLBLADDER AND BILE DUCTS: The gallbladder is unremarkable. No common bile duct dilatation. SPLEEN: The spleen is normal  in size. No splenic lesions. PANCREAS: No pancreatic mass or inflammation. ADRENAL GLANDS: The adrenal glands are normal. KIDNEYS, URETERS AND BLADDER: No worrisome renal lesions are identified without contrast. No stones in the kidneys or ureters. No hydronephrosis. No perinephric or periureteral stranding. The bladder is grossly normal. GI AND BOWEL: The duodenum, small bowel, and colon are grossly normal. No inflammatory changes or obstructive findings. The appendix is normal. REPRODUCTIVE ORGANS: No acute abnormality. PERITONEUM AND RETROPERITONEUM: No ascites. No free air. VASCULATURE: Stable aortoiliac stent graft. No complicating features. Stable advanced vascular disease. ABDOMINAL AND PELVIS LYMPH NODES: No abdominal or pelvic lymphadenopathy. No pelvic or inguinal adenopathy. BONES AND SOFT TISSUES: No chest wall mass, supraclavicular or axillary adenopathy. The bony thorax is intact. The bony structures of the abdomen and pelvis are unremarkable. No lytic or sclerotic bone lesions. Stable advanced degenerative disc disease at L4-L5. No focal soft tissue abnormality. IMPRESSION: 1. Progressive mass-like pleural thickening at the left lung base measuring 6.7 x 3.5 cm, suspicious for pleural tumor. Recommend PET/CT or biopsy for further evaluation. 2. Progressive heterogeneous hepatic parenchyma with increasing low-attenuation areas, indeterminate for metastatic disease geographic fatty infiltration. Recommend PET/CT or contrast-enhanced MRI of the abdomen if feasible. 3. Stable 6 mm pulmonary nodule at the right lung base. Electronically signed by: Maude Stammer MD 02/29/2024 10:44 AM EST RP Workstation: [id number]    ASSESSMENT AND PLAN:  This is a very pleasant 68 years old white male with a stage IB non-small cell lung cancer, adenocarcinoma status post wedge resection of the left upper lobe followed by 4 cycles of adjuvant systemic chemotherapy with cisplatin  and Alimta  and he tolerated his  treatment well except for fatigue. The patient has been on observation since June 2018.   The recent imaging studies including CT scan of the chest as well as a PET scan showed evidence for disease recurrence with metastatic disease presented with bilateral pulmonary nodules as well as destructive bone lesions at T4 vertebral body, biopsy proven to be metastatic adenocarcinoma. Molecular studies by foundation 1 showed KRAS G12C mutation.  PDL 1 expression was negative. The patient was treated with carboplatin , Alimta  and Ketruda (pembrolizumab ) status post 47 cycles.  Starting from cycle #5 he is on maintenance treatment with Alimta  and Keytruda  every 3 weeks. He has been on treatment with single agent Keytruda  recently because of the renal insufficiency.  The patient has been tolerating this treatment well with no concerning complaints.  This treatment was discontinued secondary to disease progression. He started second line treatment with Lumakras  (Sotorasib ) 960 mg p.o. daily on October 29, 2020. Unfortunately the patient had significant liver dysfunction secondary to the treatment with Lumakras  (Sotorasib ).  Status post 1 months.  He was treated with a tapered dose of prednisone  for liver toxicity.  We resumed his treatment with reduced dose Lumakras  (Sotorasib ) at 480 mg p.o. daily but again the patient developed significant liver dysfunction within few days of resuming the treatment.  His treatment was discontinued and the patient was treated with a tapered dose of prednisone . The patient is currently on observation for more than 3 years. The patient had repeat CT scan of the chest, abdomen and pelvis performed recently.  I personally independently reviewed the scan and discussed the results with the patient today.  His scan showed concerning findings for disease progression in the chest as well as the liver. Assessment and Plan Assessment & Plan Metastatic non-small cell lung cancer with possible  progression Metastatic non-small cell lung cancer initially diagnosed as stage 1B in December 2017, with recurrence in July 2019. Molecular studies show positive KRAS G12C mutation and PD-L1 expression of 0%. Previous treatments included systemic chemotherapy with carboplatin , Alimta , and Keytruda , followed by maintenance treatment with Alimta  and Keytruda , which was discontinued after 47 cycles due to disease progression. Lomacras was started but discontinued due to liver toxicity. Current CT scan shows concerning findings with enlargement of the left chest wall and suspicious liver lesion, suggesting possible disease progression. - Ordered PET scan to assess disease progression - Scheduled follow-up appointment in two weeks to discuss PET scan results and potential treatment options  Chronic kidney disease Necessitates non-contrast imaging studies. The patient was advised to call immediately if he has any concerning symptoms in the interval. The patient voices understanding of current disease status and treatment options and is in agreement with the current care plan. All questions were answered. The patient knows to call the clinic with any problems, questions or concerns. We can certainly see the patient much sooner if necessary. The total time spent in the appointment was 30 minutes.  Disclaimer: This note was dictated with voice recognition software. Similar sounding words can inadvertently be transcribed and may be missed upon review. Sherrod MARLA Sherrod, MD 03/02/24

## 2024-03-03 ENCOUNTER — Telehealth: Payer: Self-pay | Admitting: Internal Medicine

## 2024-03-03 NOTE — Telephone Encounter (Signed)
 Scheduled patient for next appointment. Called and spoke with the patient, he is aware.

## 2024-03-09 ENCOUNTER — Encounter (HOSPITAL_COMMUNITY)
Admission: RE | Admit: 2024-03-09 | Discharge: 2024-03-09 | Disposition: A | Discharge status: Home / Self Care | Source: Ambulatory Visit | Attending: Internal Medicine | Admitting: Internal Medicine

## 2024-03-09 DIAGNOSIS — C3490 Malignant neoplasm of unspecified part of unspecified bronchus or lung: Secondary | ICD-10-CM | POA: Diagnosis present

## 2024-03-09 DIAGNOSIS — R918 Other nonspecific abnormal finding of lung field: Secondary | ICD-10-CM | POA: Insufficient documentation

## 2024-03-09 DIAGNOSIS — E041 Nontoxic single thyroid nodule: Secondary | ICD-10-CM | POA: Insufficient documentation

## 2024-03-09 DIAGNOSIS — I7121 Aneurysm of the ascending aorta, without rupture: Secondary | ICD-10-CM | POA: Diagnosis not present

## 2024-03-09 LAB — GLUCOSE, CAPILLARY: Glucose-Capillary: 97 mg/dL (ref 70–99)

## 2024-03-09 MED ORDER — HEPARIN SOD (PORK) LOCK FLUSH 100 UNIT/ML IV SOLN
500.0000 [IU] | Freq: Once | INTRAVENOUS | Status: AC
  Administered 2024-03-09: 500 [IU] via INTRAVENOUS

## 2024-03-09 MED ORDER — FLUDEOXYGLUCOSE F - 18 (FDG) INJECTION
8.0000 | Freq: Once | INTRAVENOUS | Status: AC
  Administered 2024-03-09: 7.93 via INTRAVENOUS

## 2024-03-16 ENCOUNTER — Telehealth: Payer: Self-pay | Admitting: Internal Medicine

## 2024-03-16 ENCOUNTER — Inpatient Hospital Stay: Admitting: Internal Medicine

## 2024-03-16 VITALS — BP 133/78 | HR 100 | Temp 98.7°F | Resp 17 | Ht 75.5 in | Wt 159.0 lb

## 2024-03-16 DIAGNOSIS — C3490 Malignant neoplasm of unspecified part of unspecified bronchus or lung: Secondary | ICD-10-CM

## 2024-03-16 NOTE — Progress Notes (Signed)
 [facility] Telephone:(336) [phone]   [phone]  OFFICE PROGRESS NOTE  [name], MD [address] [place] [address]  DIAGNOSIS: Metastatic non-small cell lung cancer, adenocarcinoma initially diagnosed as stage IB (T2a, N0, M0) non-small cell lung cancer, adenocarcinoma diagnosed in December 2017.  The patient has evidence for disease recurrence in July 2019.  Biomarker Findings Tumor Mutational Burden - TMB-Intermediate (16 Muts/Mb) Microsatellite status - MS-Stable Genomic Findings For a complete list of the genes assayed, please refer to the Appendix. KIT amplification KRAS G12C SMAD4 splice site 1309-1G>T TP53 I232F 7 Disease relevant genes with no reportable alterations: EGFR, ALK, BRAF, MET, RET, ERBB2, ROS1   PDL 1 expression is 0%  PRIOR THERAPY:  1) status post left lower lobectomy as well as wedge resection of the left upper lobe on 05/11/2016. 2) Adjuvant systemic chemotherapy with cisplatin  75 MG/M2 and Alimta  500 MG/M2 every 3 weeks. First dose 07/03/2016. Status post 4 cycles. 3) Systemic chemotherapy with carboplatin  for AUC of 5, Alimta  500 mg/M2 and Keytruda  200 mg IV every 3 weeks.  Status post 47 cycles.  Starting from cycle #5 the patient will be treated with maintenance Alimta  and Ketruda (pembrolizumab ) every 3 weeks.  This treatment was discontinued secondary to disease progression. 4) Lumakras  (Sotorasib ) 960 mg p.o. daily started October 29, 2020.  Status post 1 months of treatment.  This treatment was discontinued secondary to liver toxicity even after dose reduction.  CURRENT THERAPY: Observation.  INTERVAL HISTORY: [name] 68 y.o. male turns to the clinic today for follow-up visit accompanied by his wife.  Discussed the use of AI scribe software for clinical note transcription with the patient, who gave verbal consent to proceed.  History of Present Illness [name] is a 68 year old male with  metastatic non-small cell lung cancer who presents for evaluation and discussion of his recent PET scan results. He is accompanied by his wife.  He has a history of metastatic non-small cell lung cancer, adenocarcinoma, initially diagnosed as stage 1B in December 2017, with recurrence in July 2019. His treatment history includes systemic chemotherapy with carboplatin  and Alimta , followed by Alimta  and Keytruda , and maintenance treatment with Alimta  and Keytruda  for 47 cycles. He was also treated with Lumakras  for one month, which was discontinued due to intolerance and liver toxicity. Since August 2022, he has been under observation.  A recent CT scan of the chest, abdomen, and pelvis showed mass-like pleural thickening and heterogeneous hepatic parenchyma. A PET scan was performed on March 09, 2024, to further evaluate these findings.  He experiences ongoing breathing difficulties. He has not resumed smoking since his surgery.    MEDICAL HISTORY: Past Medical History:  Diagnosis Date   AAA (abdominal aortic aneurysm)    AAA (abdominal aortic aneurysm) without rupture 02/04/2014   Cancer (HCC)    lung   Encounter for antineoplastic chemotherapy 06/06/2016   History of hepatitis B    HNP (herniated nucleus pulposus), lumbar    L4 with radiculopathy   Hypertension    Lung cancer (HCC) 05/18/2016   Malignant neoplasm of lower lobe of left lung (HCC) 04/05/2016   Mass of left lung    Mitral insufficiency 04/06/2016   This patient will eventually need MV repair if his prognosis is good from oncology standpoint. However, his lung cancer therapy  is the priority right now. His cardiac condition won't preclude possible lung surgery.  Once  his lung cancer is under control he will need a TEE to further evaluate his mitral valve anatomy and MR severity, and also have ischemic workup as tere is evidence on calcificati   Renal insufficiency 10/09/2016   S/P partial lobectomy of lung 05/11/2016    Varicose veins of legs     ALLERGIES:  is allergic to tramadol.  MEDICATIONS:  Current Outpatient Medications  Medication Sig Dispense Refill   lisinopril  (ZESTRIL ) 10 MG tablet Take 1 tablet (10 mg total) by mouth daily. 90 tablet 3   No current facility-administered medications for this visit.    SURGICAL HISTORY:  Past Surgical History:  Procedure Laterality Date   ABDOMINAL AORTIC ENDOVASCULAR STENT GRAFT N/A 03/12/2016   Procedure: ABDOMINAL AORTIC ENDOVASCULAR STENT GRAFT;  Surgeon: Redell LITTIE Door, MD;  Location: [facility];  Service: Vascular;  Laterality: N/A;   COLONOSCOPY     INGUINAL HERNIA REPAIR Left 1975   Left inguinal hernia   INGUINAL HERNIA REPAIR Right    IR IMAGING GUIDED PORT INSERTION  09/04/2019   LOBECTOMY Left 05/11/2016   Procedure: LEFT LOWER LOBE LOBECTOMY AND LEFT UPPER LOBE  RESECTION AND PLACEMENT OF ON-Q;  Surgeon: Dallas KATHEE Jude, MD;  Location: [facility] OR;  Service: Thoracic;  Laterality: Left;   LUMBAR LAMINECTOMY  October 22, 2012   LYMPH NODE DISSECTION Left 05/11/2016   Procedure: LYMPH NODE DISSECTION;  Surgeon: Dallas KATHEE Jude, MD;  Location: [facility] OR;  Service: Thoracic;  Laterality: Left;   RIGHT/LEFT HEART CATH AND CORONARY ANGIOGRAPHY N/A 03/10/2020   Procedure: RIGHT/LEFT HEART CATH AND CORONARY ANGIOGRAPHY;  Surgeon: Wonda Sharper, MD;  Location: [facility] INVASIVE CV LAB;  Service: Cardiovascular;  Laterality: N/A;   STAPLING OF BLEBS Left 05/11/2016   Procedure: STAPLING OF APICAL BLEB;  Surgeon: Dallas KATHEE Jude, MD;  Location: [facility] OR;  Service: Thoracic;  Laterality: Left;   TEE WITHOUT CARDIOVERSION N/A 10/07/2019   Procedure: TRANSESOPHAGEAL ECHOCARDIOGRAM (TEE);  Surgeon: Maranda Leim DEL, MD;  Location: [facility] ENDOSCOPY;  Service: Cardiovascular;  Laterality: N/A;   VIDEO ASSISTED THORACOSCOPY Left 05/18/2016   Procedure: VIDEO ASSISTED THORACOSCOPY WITH REMOVAL OF LEFT APICAL BLEB;  Surgeon: Dallas KATHEE Jude, MD;  Location: [facility] OR;  Service: Thoracic;   Laterality: Left;   VIDEO ASSISTED THORACOSCOPY (VATS)/WEDGE RESECTION Left 05/11/2016   Procedure: LEFT VIDEO ASSISTED THORACOSCOPY (VATS);  Surgeon: Dallas KATHEE Jude, MD;  [facility];  Service: Thoracic;  Laterality: Left;   VIDEO BRONCHOSCOPY N/A 05/11/2016   Procedure: VIDEO BRONCHOSCOPY, LEFT LUNG;  Surgeon: Dallas KATHEE Jude, MD;  Location: [facility] OR;  Service: Thoracic;  Laterality: N/A;   VIDEO BRONCHOSCOPY N/A 05/18/2016   Procedure: VIDEO BRONCHOSCOPY WITH BRONCHIAL WASHING;  Surgeon: Dallas KATHEE Jude, MD;  Location: [facility] OR;  Service: Thoracic;  Laterality: N/A;    REVIEW OF SYSTEMS:  Constitutional: positive for fatigue Eyes: negative Ears, nose, mouth, throat, and face: negative Respiratory: negative Cardiovascular: negative Gastrointestinal: negative Genitourinary:negative Integument/breast: negative Hematologic/lymphatic: negative Musculoskeletal:negative Neurological: negative Behavioral/Psych: negative Endocrine: negative Allergic/Immunologic: negative   PHYSICAL EXAMINATION: General appearance: alert, cooperative, fatigued, and no distress Head: Normocephalic, without obvious abnormality, atraumatic Neck: no adenopathy, no JVD, supple, symmetrical, trachea midline, and thyroid  not enlarged, symmetric, no tenderness/mass/nodules Lymph nodes: Cervical, supraclavicular, and axillary nodes normal. Resp: clear to auscultation bilaterally Back: symmetric, no curvature. ROM normal. No CVA tenderness. Cardio: regular rate and rhythm, S1, S2 normal, no murmur, click, rub or gallop GI: soft, non-tender; bowel sounds normal; no masses,  no organomegaly Extremities:  extremities normal, atraumatic, no cyanosis or edema Neurologic: Alert and oriented X 3, normal strength and tone. Normal symmetric reflexes. Normal coordination and gait   ECOG PERFORMANCE STATUS: 1 - Symptomatic but completely ambulatory  Blood pressure 133/78, pulse 100, temperature 98.7 F (37.1 C),  temperature source Temporal, resp. rate 17, height 6' 3.5 (1.918 m), weight 159 lb (72.1 kg), SpO2 98%.  LABORATORY DATA: Lab Results  Component Value Date   WBC 10.4 02/24/2024   HGB 17.2 (H) 02/24/2024   HCT 49.3 02/24/2024   MCV 88.8 02/24/2024   PLT 222 02/24/2024      Chemistry      Component Value Date/Time   NA 130 (L) 02/24/2024 0943   NA 135 06/14/2021 1342   NA 138 01/11/2017 1343   K 4.5 02/24/2024 0943   K 4.7 01/11/2017 1343   CL 100 02/24/2024 0943   CO2 23 02/24/2024 0943   CO2 23 01/11/2017 1343   BUN 17 02/24/2024 0943   BUN 19 06/14/2021 1342   BUN 20.2 01/11/2017 1343   CREATININE 1.41 (H) 02/24/2024 0943   CREATININE 1.6 (H) 01/11/2017 1343      Component Value Date/Time   CALCIUM 9.0 02/24/2024 0943   CALCIUM 9.3 01/11/2017 1343   ALKPHOS 84 02/24/2024 0943   ALKPHOS 89 01/11/2017 1343   AST 31 02/24/2024 0943   AST 41 (H) 01/11/2017 1343   ALT 27 02/24/2024 0943   ALT 27 01/11/2017 1343   BILITOT 0.8 02/24/2024 0943   BILITOT 0.49 01/11/2017 1343       RADIOGRAPHIC STUDIES: NM PET Image Restage (PS) Skull Base to Thigh (F-18 FDG) Result Date: 03/12/2024 EXAM: PET AND CT SKULL BASE TO MID THIGH 03/09/2024 05:10:37 PM TECHNIQUE: RADIOPHARMACEUTICAL: 7.93 mCi F-18 FDG Uptake time 60 minutes. Glucose level  mg/dl. Blood pool SUV 2.0. PET imaging was acquired from the base of the skull to the mid thighs. Non-contrast enhanced computed tomography was obtained for attenuation correction and anatomic localization. COMPARISON: 11/12/2017 and CT scan from 02/24/2024. CLINICAL HISTORY: Non-small cell lung cancer (NSCLC), staging. FINDINGS: HEAD AND NECK: Mildly hypermetabolic left thyroid  nodule with maximum SUV 4.1. Thyroid  ultrasound recommended. 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 Bilateral common carotid atheromatous vascular calcification. No metabolically active cervical lymphadenopathy. CHEST: Focal nodularity along the anterior  margin of the ascending thoracic aorta measuring 0.8 cm in short axis on image 84 series 4 with maximum SUV 3.4, previous 4.3 back on 11/12/2017. The focal masslike thickening along the left posterolateral hemidiaphragm demonstrates central low activity and a thin rim of activity with typical SUV around 2.3. This most likely represents a small loculated and potentially exudative pleural effusion. Thoracic aortic, coronary artery, and branch vessel atheromatous vascular calcifications. Proximal ascending aortic aneurysm 4.2 cm in diameter. Calcified bilateral pleural plaques. Right port-a-cath tip in the lower SVC. Mild cardiomegaly. Emphysema. No metabolically active pulmonary nodules. No metabolically active lymphadenopathy. ABDOMEN AND PELVIS: Hepatic steatosis. Ureteral biliary stent graft versus a native infrarenal abdominal aortic aneurysm. Prostatomegaly. No metabolically active intraperitoneal mass. No metabolically active lymphadenopathy. Physiologic activity within the gastrointestinal and genitourinary systems. BONES AND SOFT TISSUE: Healed deformity in the posterior elements of T4 at the site of the previous lytic metastatic lesion. No abnormal FDG activity localizes to the bones. No metabolically active aggressive osseous lesion. IMPRESSION: 1. No evidence of metabolically active or FDG-avid disease related to non-small cell lung cancer. The lesion along the left hemidiaphragm has a photopenic center and mild low-grade activity  along its margins, suggesting a loculation pleural effusion , potentially exudative. 2. Focal nodularity along the anterior margin of the ascending thoracic aorta with maximum SUV 3.4, decreased from 4.3 on 11/12/2017. 3. Proximal ascending aortic aneurysm measuring 4.2 cm in diameter. Consider surveillance in the context of the patient's anticipated follow-up imaging. 4. Mildly hypermetabolic left thyroid  nodule with maximum SUV 4.1; recommend non-emergent thyroid  ultrasound.  Electronically signed by: Ryan Salvage MD 03/12/2024 01:42 PM EST RP Workstation: [id number]   CT Chest Wo Contrast Result Date: 02/29/2024 EXAM: CT CHEST, ABDOMEN AND PELVIS WITHOUT CONTRAST 02/24/2024 10:52:12 AM TECHNIQUE: CT of the chest, abdomen and pelvis was performed without the administration of intravenous contrast. Multiplanar reformatted images are provided for review. Automated exposure control, iterative reconstruction, and/or weight based adjustment of the mA/kV was utilized to reduce the radiation dose to as low as reasonably achievable. COMPARISON: CT Chest Abdomen Pelvis 08/28/2023; CT Chest 09/10/2022. CLINICAL HISTORY: Non-small cell lung cancer. FINDINGS: CHEST: MEDIASTINUM AND LYMPH NODES: The heart is normal in size. No pericardial effusion. Stable tortuosity, ectasia, and calcification of the thoracic aorta. Stable 3-vessel coronary artery calcifications. The central airways are clear. No mediastinal or hilar mass or lymphadenopathy. Stable soft tissue nodule in the anterior mediastinum with a somewhat triangular shape, likely residual thymic tissue. The esophagus is unremarkable and stable. Unchanged 10 mm left thyroid  nodule not require any further imaging evaluation or follow-up. LUNGS AND PLEURA: Stable surgical changes involving the left hemithorax. Stable advanced emphysematous changes and pulmonary scarring. No acute pulmonary process. The central tracheobronchial tree is unremarkable. No worrisome pulmonary lesions. Stable 6 mm nodule at the right lung base. This is unchanged since 2022 and considered benign. Mass-like area of pleural thickening at the left lung base laterally. This has slowly enlarged over the past several CT scans. It measures a maximum of 6.7 x 3.5 cm and on the most recent study from May, measured 5.3 x 2.6 cm. Findings worrisome for pleural tumor. Recommend PET/CT or biopsy for further evaluation. Stable calcified pleura on the right side. Stable right  sided port-a-cath. No pleural effusion or pneumothorax. ABDOMEN AND PELVIS: LIVER: Very heterogeneous appearance of the liver parenchyma with progressive appearing areas of low attenuation possibly reflecting geographic fatty infiltration. These findings appear progressive since prior CT scans and cannot exclude the possibility of metastatic disease. This could also be evaluated with PET CT or MRI abdomen if the patient can receive contrast. Few small rounded areas of low attenuation are likely benign cysts. No intrahepatic biliary dilatation. GALLBLADDER AND BILE DUCTS: The gallbladder is unremarkable. No common bile duct dilatation. SPLEEN: The spleen is normal in size. No splenic lesions. PANCREAS: No pancreatic mass or inflammation. ADRENAL GLANDS: The adrenal glands are normal. KIDNEYS, URETERS AND BLADDER: No worrisome renal lesions are identified without contrast. No stones in the kidneys or ureters. No hydronephrosis. No perinephric or periureteral stranding. The bladder is grossly normal. GI AND BOWEL: The duodenum, small bowel, and colon are grossly normal. No inflammatory changes or obstructive findings. The appendix is normal. REPRODUCTIVE ORGANS: No acute abnormality. PERITONEUM AND RETROPERITONEUM: No ascites. No free air. VASCULATURE: Stable aortoiliac stent graft. No complicating features. Stable advanced vascular disease. ABDOMINAL AND PELVIS LYMPH NODES: No abdominal or pelvic lymphadenopathy. No pelvic or inguinal adenopathy. BONES AND SOFT TISSUES: No chest wall mass, supraclavicular or axillary adenopathy. The bony thorax is intact. The bony structures of the abdomen and pelvis are unremarkable. No lytic or sclerotic bone lesions. Stable advanced degenerative disc disease  at L4-L5. No focal soft tissue abnormality. IMPRESSION: 1. Progressive mass-like pleural thickening at the left lung base measuring 6.7 x 3.5 cm, suspicious for pleural tumor. Recommend PET/CT or biopsy for further evaluation.  2. Progressive heterogeneous hepatic parenchyma with increasing low-attenuation areas, indeterminate for metastatic disease geographic fatty infiltration. Recommend PET/CT or contrast-enhanced MRI of the abdomen if feasible. 3. Stable 6 mm pulmonary nodule at the right lung base. Electronically signed by: Maude Stammer MD 02/29/2024 10:44 AM EST RP Workstation: [id number]   CT ABDOMEN PELVIS WO CONTRAST Result Date: 02/29/2024 EXAM: CT CHEST, ABDOMEN AND PELVIS WITHOUT CONTRAST 02/24/2024 10:52:12 AM TECHNIQUE: CT of the chest, abdomen and pelvis was performed without the administration of intravenous contrast. Multiplanar reformatted images are provided for review. Automated exposure control, iterative reconstruction, and/or weight based adjustment of the mA/kV was utilized to reduce the radiation dose to as low as reasonably achievable. COMPARISON: CT Chest Abdomen Pelvis 08/28/2023; CT Chest 09/10/2022. CLINICAL HISTORY: Non-small cell lung cancer. FINDINGS: CHEST: MEDIASTINUM AND LYMPH NODES: The heart is normal in size. No pericardial effusion. Stable tortuosity, ectasia, and calcification of the thoracic aorta. Stable 3-vessel coronary artery calcifications. The central airways are clear. No mediastinal or hilar mass or lymphadenopathy. Stable soft tissue nodule in the anterior mediastinum with a somewhat triangular shape, likely residual thymic tissue. The esophagus is unremarkable and stable. Unchanged 10 mm left thyroid  nodule not require any further imaging evaluation or follow-up. LUNGS AND PLEURA: Stable surgical changes involving the left hemithorax. Stable advanced emphysematous changes and pulmonary scarring. No acute pulmonary process. The central tracheobronchial tree is unremarkable. No worrisome pulmonary lesions. Stable 6 mm nodule at the right lung base. This is unchanged since 2022 and considered benign. Mass-like area of pleural thickening at the left lung base laterally. This has slowly  enlarged over the past several CT scans. It measures a maximum of 6.7 x 3.5 cm and on the most recent study from May, measured 5.3 x 2.6 cm. Findings worrisome for pleural tumor. Recommend PET/CT or biopsy for further evaluation. Stable calcified pleura on the right side. Stable right sided port-a-cath. No pleural effusion or pneumothorax. ABDOMEN AND PELVIS: LIVER: Very heterogeneous appearance of the liver parenchyma with progressive appearing areas of low attenuation possibly reflecting geographic fatty infiltration. These findings appear progressive since prior CT scans and cannot exclude the possibility of metastatic disease. This could also be evaluated with PET CT or MRI abdomen if the patient can receive contrast. Few small rounded areas of low attenuation are likely benign cysts. No intrahepatic biliary dilatation. GALLBLADDER AND BILE DUCTS: The gallbladder is unremarkable. No common bile duct dilatation. SPLEEN: The spleen is normal in size. No splenic lesions. PANCREAS: No pancreatic mass or inflammation. ADRENAL GLANDS: The adrenal glands are normal. KIDNEYS, URETERS AND BLADDER: No worrisome renal lesions are identified without contrast. No stones in the kidneys or ureters. No hydronephrosis. No perinephric or periureteral stranding. The bladder is grossly normal. GI AND BOWEL: The duodenum, small bowel, and colon are grossly normal. No inflammatory changes or obstructive findings. The appendix is normal. REPRODUCTIVE ORGANS: No acute abnormality. PERITONEUM AND RETROPERITONEUM: No ascites. No free air. VASCULATURE: Stable aortoiliac stent graft. No complicating features. Stable advanced vascular disease. ABDOMINAL AND PELVIS LYMPH NODES: No abdominal or pelvic lymphadenopathy. No pelvic or inguinal adenopathy. BONES AND SOFT TISSUES: No chest wall mass, supraclavicular or axillary adenopathy. The bony thorax is intact. The bony structures of the abdomen and pelvis are unremarkable. No lytic or  sclerotic bone  lesions. Stable advanced degenerative disc disease at L4-L5. No focal soft tissue abnormality. IMPRESSION: 1. Progressive mass-like pleural thickening at the left lung base measuring 6.7 x 3.5 cm, suspicious for pleural tumor. Recommend PET/CT or biopsy for further evaluation. 2. Progressive heterogeneous hepatic parenchyma with increasing low-attenuation areas, indeterminate for metastatic disease geographic fatty infiltration. Recommend PET/CT or contrast-enhanced MRI of the abdomen if feasible. 3. Stable 6 mm pulmonary nodule at the right lung base. Electronically signed by: Maude Stammer MD 02/29/2024 10:44 AM EST RP Workstation: [id number]    ASSESSMENT AND PLAN:  This is a very pleasant 68 years old white male with a stage IB non-small cell lung cancer, adenocarcinoma status post wedge resection of the left upper lobe followed by 4 cycles of adjuvant systemic chemotherapy with cisplatin  and Alimta  and he tolerated his treatment well except for fatigue. The patient has been on observation since June 2018.   The recent imaging studies including CT scan of the chest as well as a PET scan showed evidence for disease recurrence with metastatic disease presented with bilateral pulmonary nodules as well as destructive bone lesions at T4 vertebral body, biopsy proven to be metastatic adenocarcinoma. Molecular studies by foundation 1 showed KRAS G12C mutation.  PDL 1 expression was negative. The patient was treated with carboplatin , Alimta  and Ketruda (pembrolizumab ) status post 47 cycles.  Starting from cycle #5 he is on maintenance treatment with Alimta  and Keytruda  every 3 weeks. He has been on treatment with single agent Keytruda  recently because of the renal insufficiency.  The patient has been tolerating this treatment well with no concerning complaints.  This treatment was discontinued secondary to disease progression. He started second line treatment with Lumakras  (Sotorasib ) 960 mg  p.o. daily on October 29, 2020. Unfortunately the patient had significant liver dysfunction secondary to the treatment with Lumakras  (Sotorasib ).  Status post 1 months.  He was treated with a tapered dose of prednisone  for liver toxicity.  We resumed his treatment with reduced dose Lumakras  (Sotorasib ) at 480 mg p.o. daily but again the patient developed significant liver dysfunction within few days of resuming the treatment.  His treatment was discontinued and the patient was treated with a tapered dose of prednisone . The patient is currently on observation for more than 3 years. Recent CT scan of the chest, abdomen and pelvis showed some concerning finding of disease progression with pleural-based mass and consolidation as well as suspicious liver metastasis.  The patient had a PET scan performed recently and that showed no concerning findings for disease recurrence or metastasis. Assessment and Plan Assessment & Plan Metastatic non-small cell lung cancer, adenocarcinoma involving liver and pleura Diagnosed in December 2017 as stage 1B with recurrence in July 2019. Previous treatments included systemic chemotherapy with carboplatin , Alimta , and Keytruda , followed by maintenance treatment with Alimta  and Keytruda  for 47 cycles. Lumakras  was discontinued due to intolerance and liver toxicity. Recent PET scan on November 17th, 2025, showed no evidence of cancer progression, indicating a false alarm on the previous CT scan. Liver and pleura appear stable. - Continue observation with a six-month follow-up schedule. - Advised to report any new symptoms or changes immediately. He was advised to call immediately if he has any concerning symptoms in the interval. The patient voices understanding of current disease status and treatment options and is in agreement with the current care plan. All questions were answered. The patient knows to call the clinic with any problems, questions or concerns. We can certainly  see the patient  much sooner if necessary. The total time spent in the appointment was 30 minutes.  Disclaimer: This note was dictated with voice recognition software. Similar sounding words can inadvertently be transcribed and may be missed upon review. Sherrod MARLA Sherrod, MD 03/16/24

## 2024-03-16 NOTE — Telephone Encounter (Signed)
 Scheduled patient for next appointment. Called and spoke with the patient, he is aware.

## 2024-04-06 ENCOUNTER — Telehealth: Payer: Self-pay | Admitting: Internal Medicine

## 2024-04-06 ENCOUNTER — Inpatient Hospital Stay: Attending: Internal Medicine

## 2024-04-06 NOTE — Telephone Encounter (Signed)
 Patient does not want to reschedule port flush appt

## 2024-04-06 NOTE — Telephone Encounter (Signed)
 Called the patient to reschedule missed port flush today. He said he does not want to reschedule and that he got his port flushed on 11/17 and he will just come back in January. I let him know I will have a nurse contact him to discuss and make sure that is ok.

## 2024-05-15 ENCOUNTER — Telehealth: Payer: Self-pay | Admitting: Physician Assistant

## 2024-05-15 NOTE — Telephone Encounter (Signed)
 Pt called to resched his  port flush on 1/26 to 1/28

## 2024-05-18 ENCOUNTER — Inpatient Hospital Stay

## 2024-05-19 ENCOUNTER — Telehealth: Payer: Self-pay

## 2024-05-19 NOTE — Telephone Encounter (Signed)
 Spoke with patient in regards to port flush appt change.  Rescheduled patients appt to Friday, 05/22/24 @ 245 PM.  He voiced understanding.

## 2024-05-20 ENCOUNTER — Inpatient Hospital Stay: Attending: Internal Medicine

## 2024-05-22 ENCOUNTER — Inpatient Hospital Stay: Attending: Internal Medicine

## 2024-05-27 ENCOUNTER — Other Ambulatory Visit: Payer: Self-pay | Admitting: Cardiovascular Disease

## 2024-05-29 NOTE — Telephone Encounter (Signed)
 In accordance with refill protocols, please review and address the following requirements before this medication refill can be authorized:  Labs  Pt needs labs within 365 days within normal range

## 2024-06-05 ENCOUNTER — Ambulatory Visit: Admitting: Cardiovascular Disease

## 2024-06-05 ENCOUNTER — Other Ambulatory Visit (HOSPITAL_COMMUNITY)

## 2024-09-07 ENCOUNTER — Inpatient Hospital Stay

## 2024-09-15 ENCOUNTER — Inpatient Hospital Stay: Admitting: Internal Medicine
